# Patient Record
Sex: Female | Born: 1955 | Race: Black or African American | Hispanic: No | Marital: Married | Smoking: Former smoker
Health system: Southern US, Community
[De-identification: ages and names within clinical notes are randomized; demographics above are authoritative.]

## PROBLEM LIST (undated history)

## (undated) DIAGNOSIS — E119 Type 2 diabetes mellitus without complications: Secondary | ICD-10-CM

## (undated) DIAGNOSIS — M5430 Sciatica, unspecified side: Secondary | ICD-10-CM

## (undated) DIAGNOSIS — R0600 Dyspnea, unspecified: Secondary | ICD-10-CM

## (undated) DIAGNOSIS — C329 Malignant neoplasm of larynx, unspecified: Secondary | ICD-10-CM

## (undated) DIAGNOSIS — D72819 Decreased white blood cell count, unspecified: Secondary | ICD-10-CM

## (undated) DIAGNOSIS — F419 Anxiety disorder, unspecified: Secondary | ICD-10-CM

## (undated) DIAGNOSIS — Z930 Tracheostomy status: Secondary | ICD-10-CM

## (undated) DIAGNOSIS — K219 Gastro-esophageal reflux disease without esophagitis: Secondary | ICD-10-CM

## (undated) DIAGNOSIS — A419 Sepsis, unspecified organism: Secondary | ICD-10-CM

## (undated) DIAGNOSIS — K222 Esophageal obstruction: Secondary | ICD-10-CM

## (undated) DIAGNOSIS — T17508A Unspecified foreign body in bronchus causing other injury, initial encounter: Secondary | ICD-10-CM

## (undated) DIAGNOSIS — D649 Anemia, unspecified: Secondary | ICD-10-CM

## (undated) DIAGNOSIS — F32A Depression, unspecified: Secondary | ICD-10-CM

## (undated) DIAGNOSIS — Z923 Personal history of irradiation: Secondary | ICD-10-CM

## (undated) DIAGNOSIS — I10 Essential (primary) hypertension: Secondary | ICD-10-CM

## (undated) DIAGNOSIS — E039 Hypothyroidism, unspecified: Secondary | ICD-10-CM

## (undated) DIAGNOSIS — Z9002 Acquired absence of larynx: Secondary | ICD-10-CM

## (undated) DIAGNOSIS — R110 Nausea: Secondary | ICD-10-CM

## (undated) DIAGNOSIS — Z87891 Personal history of nicotine dependence: Secondary | ICD-10-CM

## (undated) DIAGNOSIS — R569 Unspecified convulsions: Secondary | ICD-10-CM

## (undated) DIAGNOSIS — M1990 Unspecified osteoarthritis, unspecified site: Secondary | ICD-10-CM

## (undated) DIAGNOSIS — J449 Chronic obstructive pulmonary disease, unspecified: Secondary | ICD-10-CM

## (undated) DIAGNOSIS — M542 Cervicalgia: Secondary | ICD-10-CM

## (undated) DIAGNOSIS — K648 Other hemorrhoids: Secondary | ICD-10-CM

## (undated) DIAGNOSIS — C50919 Malignant neoplasm of unspecified site of unspecified female breast: Secondary | ICD-10-CM

## (undated) DIAGNOSIS — K5790 Diverticulosis of intestine, part unspecified, without perforation or abscess without bleeding: Secondary | ICD-10-CM

## (undated) DIAGNOSIS — R011 Cardiac murmur, unspecified: Secondary | ICD-10-CM

## (undated) DIAGNOSIS — IMO0001 Reserved for inherently not codable concepts without codable children: Secondary | ICD-10-CM

## (undated) DIAGNOSIS — J40 Bronchitis, not specified as acute or chronic: Secondary | ICD-10-CM

## (undated) DIAGNOSIS — J189 Pneumonia, unspecified organism: Secondary | ICD-10-CM

## (undated) DIAGNOSIS — J329 Chronic sinusitis, unspecified: Secondary | ICD-10-CM

## (undated) HISTORY — DX: Diverticulosis of intestine, part unspecified, without perforation or abscess without bleeding: K57.90

## (undated) HISTORY — PX: LARYNGECTOMY: SUR815

## (undated) HISTORY — DX: Anemia, unspecified: D64.9

## (undated) HISTORY — DX: Gastro-esophageal reflux disease without esophagitis: K21.9

## (undated) HISTORY — DX: Personal history of irradiation: Z92.3

## (undated) HISTORY — DX: Hypothyroidism, unspecified: E03.9

## (undated) HISTORY — DX: Unspecified convulsions: R56.9

## (undated) HISTORY — DX: Chronic sinusitis, unspecified: J32.9

## (undated) HISTORY — DX: Sepsis, unspecified organism: A41.9

## (undated) HISTORY — PX: DENTAL RESTORATION/EXTRACTION WITH X-RAY: SHX5796

## (undated) HISTORY — DX: Malignant neoplasm of larynx, unspecified: C32.9

## (undated) HISTORY — DX: Malignant neoplasm of unspecified site of unspecified female breast: C50.919

## (undated) HISTORY — DX: Nausea: R11.0

## (undated) HISTORY — DX: Decreased white blood cell count, unspecified: D72.819

## (undated) HISTORY — DX: Unspecified foreign body in bronchus causing other injury, initial encounter: T17.508A

## (undated) HISTORY — DX: Cardiac murmur, unspecified: R01.1

## (undated) HISTORY — DX: Personal history of nicotine dependence: Z87.891

## (undated) HISTORY — PX: OTHER SURGICAL HISTORY: SHX169

## (undated) HISTORY — DX: Cervicalgia: M54.2

## (undated) HISTORY — DX: Other hemorrhoids: K64.8

## (undated) HISTORY — DX: Reserved for inherently not codable concepts without codable children: IMO0001

---

## 1980-06-16 HISTORY — PX: TUBAL LIGATION: SHX77

## 1998-06-01 ENCOUNTER — Emergency Department (HOSPITAL_COMMUNITY): Admission: EM | Admit: 1998-06-01 | Discharge: 1998-06-01 | Payer: Self-pay | Admitting: Emergency Medicine

## 2001-08-17 ENCOUNTER — Emergency Department (HOSPITAL_COMMUNITY): Admission: EM | Admit: 2001-08-17 | Discharge: 2001-08-18 | Payer: Self-pay

## 2002-03-22 ENCOUNTER — Emergency Department (HOSPITAL_COMMUNITY): Admission: EM | Admit: 2002-03-22 | Discharge: 2002-03-22 | Payer: Self-pay | Admitting: Emergency Medicine

## 2002-06-18 ENCOUNTER — Emergency Department (HOSPITAL_COMMUNITY): Admission: EM | Admit: 2002-06-18 | Discharge: 2002-06-18 | Payer: Self-pay | Admitting: Emergency Medicine

## 2002-07-25 ENCOUNTER — Emergency Department (HOSPITAL_COMMUNITY): Admission: EM | Admit: 2002-07-25 | Discharge: 2002-07-25 | Payer: Self-pay | Admitting: Emergency Medicine

## 2002-10-24 ENCOUNTER — Emergency Department (HOSPITAL_COMMUNITY): Admission: EM | Admit: 2002-10-24 | Discharge: 2002-10-24 | Payer: Self-pay | Admitting: Emergency Medicine

## 2002-10-24 ENCOUNTER — Encounter: Payer: Self-pay | Admitting: Emergency Medicine

## 2003-02-13 ENCOUNTER — Emergency Department (HOSPITAL_COMMUNITY): Admission: EM | Admit: 2003-02-13 | Discharge: 2003-02-13 | Payer: Self-pay | Admitting: Emergency Medicine

## 2004-01-08 ENCOUNTER — Emergency Department (HOSPITAL_COMMUNITY): Admission: EM | Admit: 2004-01-08 | Discharge: 2004-01-08 | Payer: Self-pay

## 2006-02-24 ENCOUNTER — Emergency Department (HOSPITAL_COMMUNITY): Admission: EM | Admit: 2006-02-24 | Discharge: 2006-02-24 | Payer: Self-pay | Admitting: Emergency Medicine

## 2007-02-17 ENCOUNTER — Emergency Department (HOSPITAL_COMMUNITY): Admission: EM | Admit: 2007-02-17 | Discharge: 2007-02-17 | Payer: Self-pay | Admitting: Emergency Medicine

## 2009-08-10 ENCOUNTER — Emergency Department (HOSPITAL_COMMUNITY): Admission: EM | Admit: 2009-08-10 | Discharge: 2009-08-11 | Payer: Self-pay | Admitting: Emergency Medicine

## 2009-08-28 ENCOUNTER — Emergency Department (HOSPITAL_COMMUNITY): Admission: EM | Admit: 2009-08-28 | Discharge: 2009-08-28 | Payer: Self-pay | Admitting: Emergency Medicine

## 2010-06-10 ENCOUNTER — Emergency Department (HOSPITAL_COMMUNITY)
Admission: EM | Admit: 2010-06-10 | Discharge: 2010-06-10 | Payer: Self-pay | Source: Home / Self Care | Admitting: Emergency Medicine

## 2010-06-16 DIAGNOSIS — J189 Pneumonia, unspecified organism: Secondary | ICD-10-CM

## 2010-06-16 HISTORY — DX: Pneumonia, unspecified organism: J18.9

## 2010-06-20 ENCOUNTER — Emergency Department (HOSPITAL_COMMUNITY)
Admission: EM | Admit: 2010-06-20 | Discharge: 2010-06-20 | Payer: Self-pay | Source: Home / Self Care | Admitting: Emergency Medicine

## 2010-06-20 LAB — CBC
HCT: 39.5 % (ref 36.0–46.0)
Hemoglobin: 13 g/dL (ref 12.0–15.0)
MCH: 29.7 pg (ref 26.0–34.0)
MCHC: 32.9 g/dL (ref 30.0–36.0)
MCV: 90.2 fL (ref 78.0–100.0)
Platelets: 464 10*3/uL — ABNORMAL HIGH (ref 150–400)
RBC: 4.38 MIL/uL (ref 3.87–5.11)
RDW: 12.1 % (ref 11.5–15.5)
WBC: 6.6 10*3/uL (ref 4.0–10.5)

## 2010-06-20 LAB — DIFFERENTIAL
Basophils Absolute: 0 10*3/uL (ref 0.0–0.1)
Basophils Relative: 1 % (ref 0–1)
Eosinophils Absolute: 0.2 10*3/uL (ref 0.0–0.7)
Eosinophils Relative: 3 % (ref 0–5)
Lymphocytes Relative: 42 % (ref 12–46)
Lymphs Abs: 2.8 10*3/uL (ref 0.7–4.0)
Monocytes Absolute: 0.6 10*3/uL (ref 0.1–1.0)
Monocytes Relative: 9 % (ref 3–12)
Neutro Abs: 3.1 10*3/uL (ref 1.7–7.7)
Neutrophils Relative %: 46 % (ref 43–77)

## 2010-06-20 LAB — BASIC METABOLIC PANEL
BUN: 5 mg/dL — ABNORMAL LOW (ref 6–23)
CO2: 32 mEq/L (ref 19–32)
Calcium: 9.2 mg/dL (ref 8.4–10.5)
Chloride: 98 mEq/L (ref 96–112)
Creatinine, Ser: 1 mg/dL (ref 0.4–1.2)
GFR calc Af Amer: >60 mL/min (ref >60)
GFR calc non Af Amer: 58 mL/min — ABNORMAL LOW (ref >60)
Glucose, Bld: 121 mg/dL — ABNORMAL HIGH (ref 70–99)
Potassium: 3.5 mEq/L (ref 3.5–5.1)
Sodium: 138 mEq/L (ref 135–145)

## 2010-07-07 ENCOUNTER — Emergency Department (HOSPITAL_COMMUNITY)
Admission: EM | Admit: 2010-07-07 | Discharge: 2010-07-07 | Disposition: A | Discharge status: Other Acute Inpatient Hospital | Payer: Self-pay | Source: Home / Self Care | Admitting: Emergency Medicine

## 2010-07-07 ENCOUNTER — Inpatient Hospital Stay (HOSPITAL_COMMUNITY)
Admission: EM | Admit: 2010-07-07 | Discharge: 2010-07-19 | DRG: 011 | Disposition: A | Discharge status: Home Health | Payer: Medicaid Other | Source: Other Acute Inpatient Hospital | Attending: Internal Medicine | Admitting: Internal Medicine

## 2010-07-07 DIAGNOSIS — Z6828 Body mass index (BMI) 28.0-28.9, adult: Secondary | ICD-10-CM

## 2010-07-07 DIAGNOSIS — E876 Hypokalemia: Secondary | ICD-10-CM | POA: Diagnosis not present

## 2010-07-07 DIAGNOSIS — J9600 Acute respiratory failure, unspecified whether with hypoxia or hypercapnia: Secondary | ICD-10-CM | POA: Diagnosis present

## 2010-07-07 DIAGNOSIS — R579 Shock, unspecified: Secondary | ICD-10-CM | POA: Diagnosis not present

## 2010-07-07 DIAGNOSIS — J189 Pneumonia, unspecified organism: Secondary | ICD-10-CM | POA: Diagnosis present

## 2010-07-07 DIAGNOSIS — E0781 Sick-euthyroid syndrome: Secondary | ICD-10-CM | POA: Diagnosis present

## 2010-07-07 DIAGNOSIS — C321 Malignant neoplasm of supraglottis: Principal | ICD-10-CM | POA: Diagnosis present

## 2010-07-07 DIAGNOSIS — R1319 Other dysphagia: Secondary | ICD-10-CM | POA: Diagnosis present

## 2010-07-07 DIAGNOSIS — I10 Essential (primary) hypertension: Secondary | ICD-10-CM | POA: Diagnosis present

## 2010-07-07 DIAGNOSIS — J45909 Unspecified asthma, uncomplicated: Secondary | ICD-10-CM | POA: Diagnosis present

## 2010-07-07 DIAGNOSIS — K029 Dental caries, unspecified: Secondary | ICD-10-CM | POA: Diagnosis present

## 2010-07-07 DIAGNOSIS — F17200 Nicotine dependence, unspecified, uncomplicated: Secondary | ICD-10-CM | POA: Diagnosis present

## 2010-07-07 DIAGNOSIS — Z79899 Other long term (current) drug therapy: Secondary | ICD-10-CM

## 2010-07-07 DIAGNOSIS — R6510 Systemic inflammatory response syndrome (SIRS) of non-infectious origin without acute organ dysfunction: Secondary | ICD-10-CM | POA: Diagnosis not present

## 2010-07-07 DIAGNOSIS — K0530 Chronic periodontitis, unspecified: Secondary | ICD-10-CM | POA: Diagnosis present

## 2010-07-07 DIAGNOSIS — K083 Retained dental root: Secondary | ICD-10-CM | POA: Diagnosis present

## 2010-07-07 DIAGNOSIS — E46 Unspecified protein-calorie malnutrition: Secondary | ICD-10-CM | POA: Diagnosis not present

## 2010-07-07 DIAGNOSIS — F1010 Alcohol abuse, uncomplicated: Secondary | ICD-10-CM | POA: Diagnosis present

## 2010-07-07 DIAGNOSIS — R4182 Altered mental status, unspecified: Secondary | ICD-10-CM | POA: Diagnosis not present

## 2010-07-08 ENCOUNTER — Encounter (INDEPENDENT_AMBULATORY_CARE_PROVIDER_SITE_OTHER): Payer: Self-pay | Admitting: Pulmonary Disease

## 2010-07-08 DIAGNOSIS — I10 Essential (primary) hypertension: Secondary | ICD-10-CM

## 2010-07-08 DIAGNOSIS — J9600 Acute respiratory failure, unspecified whether with hypoxia or hypercapnia: Secondary | ICD-10-CM

## 2010-07-08 DIAGNOSIS — J387 Other diseases of larynx: Secondary | ICD-10-CM

## 2010-07-08 NOTE — H&P (Signed)
NAME:  Deborah [name], Deborah [name] NO.:  [id number]  MEDICAL RECORD NO.:  [id number]          PATIENT TYPE:  EMS  LOCATION:  ED                           FACILITY:  [facility]  PHYSICIAN:  Hillery Aldo, M.D.   DATE OF BIRTH:  [birth year]  DATE OF ADMISSION:  07/07/2010 DATE OF DISCHARGE:                             HISTORY & PHYSICAL   PRIMARY CARE PHYSICIAN:  None.  CHIEF COMPLAINT:  Hemoptysis/pharyngitis.  HISTORY OF PRESENT ILLNESS:  The patient is a 55 year old female with a past medical history of hypertension and tobacco abuse who presented to the hospital, initially, on June 20, 2010 with a chief complaint of cough, wheezing, and hemoptysis.  At that time, a chest radiograph showed possible mild pneumonia.  She was treated with a Z-Pak and instructed to follow up at [facility].  She returns today with ongoing cough and hemoptysis.  She denies any associated fever or chills.  She has noted a 40-50 pound weight loss in the past year.  She has been dyspneic at rest and has occasional twinges of chest pain.  The chest pain is nonexertional.  She also reports significant pharyngitis and intermittent headaches.  She notes that she has had with liquid and solid dysphagia for the past 2 months.  She has had nausea but no frank vomiting or diarrhea.  Nothing has alleviated her symptoms, and there are no specific aggravating factors.  Upon initial evaluation in the emergency department, the patient was noted to have left lymphadenopathy on the cervical chain, and a CT scan of the neck subsequently showed a supraglottic mass.  She has been referred to the hospitalist service for further evaluation and workup.  PAST MEDICAL HISTORY: 1. Hypertension. 2. Asthma.  PAST SURGICAL HISTORY:  None.  FAMILY HISTORY:  The patient's mother died at 80 from a massive myocardial infarction.  She had a history of prior CABG and hypertension.  The patient's father died in an early age  from unknown causes.  She has multiple siblings all of whom have hypertension.  SOCIAL HISTORY:  The patient is divorced.  She currently lives with a female partner.  She quit smoking approximately 2 months ago but prior to this, had a 1-pack-per-day habit.  She quit alcohol, mainly because of dysphagia.  She denies any associated street drug use.  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  ALLERGIES:  No known drug allergies.  CURRENT MEDICATIONS: 1. Lisinopril 10 mg p.o. daily. 2. Albuterol metered-dose inhaler 2 puffs q.4h. p.r.n.  REVIEW OF SYSTEMS:  Comprehensive 14-point review of systems was completed and is noted in the elements of the HPI above.  The only other pertinent positives are intermittent restlessness and constipation  that she feels is attributable to decreased p.o. intake from dysphagia.  She specifically denies dysuria and hematuria.  PHYSICAL EXAMINATION:  VITAL SIGNS:  Temperature 97.8, pulse 85, respirations 18, and blood pressure 173/98. GENERAL:  Well-developed, well-nourished [place] American female who is in no acute distress. HEENT::  Normocephalic, atraumatic.  PERRL.  EOMI.  Sclerae nonicteric. Oropharynx is clear.  Mucous membranes are moist.  She does have prominent coarseness on  vocalization. NECK:  Supple.  No thyromegaly.  There are enlarged lymph nodes on the left anterior cervical chain which are firm to palpation and tender. CHEST:  Coarse breath sounds bilaterally but no frank wheeze. HEART:  Regular rate, rhythm.  No murmurs, rubs, or gallops. ABDOMEN:  Soft, nontender, and nondistended with normoactive bowel sounds. EXTREMITIES:  No clubbing, edema, or cyanosis. SKIN:  Warm and dry.  No rashes. NEUROLOGIC:  The patient is alert and oriented x3.  Cranial nerves II- XII grossly intact.  Nonfocal.  DATA REVIEW:  Chest x-ray shows no active cardiopulmonary disease.  CT scan of the neck shows ill-defined soft tissue density at  the supraglottic region and mild necrotic left jugulodigastric lymphadenopathy.  LABORATORY DATA:  Sodium is 138, potassium 3.8, chloride 96, bicarb 34, BUN 5, creatinine 0.96, glucose 117, and calcium 9.3.  Point-of-care cardiac markers are negative.  White blood cell count is 5.5, hemoglobin 14.2, hematocrit 42.9, and platelets 356,000.  ASSESSMENT/PLAN: 1. Supraglottic mass.  Given the patient's weight loss, this is most     certainly worrisome for malignancy.  Dr. [name] of otolaryngology     has been consulted and has requested the patient transfer to [facility] for further biopsies and operative management.  Once     pathology is known, she will likely need an oncological evaluation     and treatment plan. 2. Hypertension.  We will continue the patient on lisinopril and add     Norvasc.  We will use hydralazine p.r.n. for elevated blood     pressures. 3. Tobacco abuse.  The patient quit recently.  Have encouraged her to     remain abstinent. 4. Abnormal weight loss.  The patient most certainly has a malignancy     but we will check thyroid function studies for complete evaluation.     We will obtain a dietitian consultation.  She will likely need     nutritional supplementation. 5. Dysphagia.  We will get speech therapy evaluation. 6. History of asthma.  We will continue nebulized bronchodilator     therapy. 7. Prophylaxis.  We will initiate Lovenox for deep venous thrombosis     prophylaxis. 8. Family history of heart disease.  The patient will likely need     operative management.  Chest radiograph is benign.  Will check a 12-     lead electrocardiogram for preoperative purposes.  Time spent on admission including face-to-face time equals approximately 1 hour.     Hillery Aldo, M.D.     CR/MEDQ  D:  07/07/2010  T:  07/07/2010  Job:  [id number]  Electronically Signed by Hillery Aldo M.D. on 07/08/2010 07:03:55 AM

## 2010-07-09 DIAGNOSIS — Z9911 Dependence on respirator [ventilator] status: Secondary | ICD-10-CM

## 2010-07-09 DIAGNOSIS — J9600 Acute respiratory failure, unspecified whether with hypoxia or hypercapnia: Secondary | ICD-10-CM

## 2010-07-09 DIAGNOSIS — C329 Malignant neoplasm of larynx, unspecified: Secondary | ICD-10-CM

## 2010-07-09 LAB — DIFFERENTIAL
Basophils Absolute: 0 10*3/uL (ref 0.0–0.1)
Basophils Absolute: 0 10*3/uL (ref 0.0–0.1)
Basophils Relative: 1 % (ref 0–1)
Basophils Relative: 0 % (ref 0–1)
Eosinophils Absolute: 0.1 10*3/uL (ref 0.0–0.7)
Eosinophils Absolute: 0 10*3/uL (ref 0.0–0.7)
Eosinophils Relative: 2 % (ref 0–5)
Eosinophils Relative: 0 % (ref 0–5)
Lymphocytes Relative: 39 % (ref 12–46)
Lymphocytes Relative: 12 % (ref 12–46)
Lymphs Abs: 2.2 10*3/uL (ref 0.7–4.0)
Lymphs Abs: 0.7 10*3/uL (ref 0.7–4.0)
Monocytes Absolute: 0.5 10*3/uL (ref 0.1–1.0)
Monocytes Absolute: 0.3 10*3/uL (ref 0.1–1.0)
Monocytes Relative: 9 % (ref 3–12)
Monocytes Relative: 5 % (ref 3–12)
Neutro Abs: 2.7 10*3/uL (ref 1.7–7.7)
Neutro Abs: 4.6 10*3/uL (ref 1.7–7.7)
Neutrophils Relative %: 49 % (ref 43–77)
Neutrophils Relative %: 83 % — ABNORMAL HIGH (ref 43–77)

## 2010-07-09 LAB — POCT I-STAT 3, ART BLOOD GAS (G3+)
Acid-Base Excess: 2 mmol/L (ref 0.0–2.0)
Bicarbonate: 27.4 mEq/L — ABNORMAL HIGH (ref 20.0–24.0)
O2 Saturation: 91 %
Patient temperature: 93.1
TCO2: 29 mmol/L (ref 0–100)
pCO2 arterial: 40.1 mmHg (ref 35.0–45.0)
pH, Arterial: 7.43 — ABNORMAL HIGH (ref 7.350–7.400)
pO2, Arterial: 50 mmHg — ABNORMAL LOW (ref 80.0–100.0)

## 2010-07-09 LAB — CBC
HCT: 42.9 % (ref 36.0–46.0)
HCT: 39.3 % (ref 36.0–46.0)
Hemoglobin: 14.2 g/dL (ref 12.0–15.0)
Hemoglobin: 12.3 g/dL (ref 12.0–15.0)
MCH: 28.9 pg (ref 26.0–34.0)
MCH: 28 pg (ref 26.0–34.0)
MCHC: 33.1 g/dL (ref 30.0–36.0)
MCHC: 31.3 g/dL (ref 30.0–36.0)
MCV: 87.4 fL (ref 78.0–100.0)
MCV: 89.3 fL (ref 78.0–100.0)
Platelets: 356 10*3/uL (ref 150–400)
Platelets: 228 10*3/uL (ref 150–400)
RBC: 4.91 MIL/uL (ref 3.87–5.11)
RBC: 4.4 MIL/uL (ref 3.87–5.11)
RDW: 12.1 % (ref 11.5–15.5)
RDW: 12.3 % (ref 11.5–15.5)
WBC: 5.5 10*3/uL (ref 4.0–10.5)
WBC: 5.5 10*3/uL (ref 4.0–10.5)

## 2010-07-09 LAB — URINALYSIS, ROUTINE W REFLEX MICROSCOPIC
Bilirubin Urine: NEGATIVE
Hgb urine dipstick: NEGATIVE
Ketones, ur: 40 mg/dL — AB
Nitrite: NEGATIVE
Protein, ur: NEGATIVE mg/dL
Specific Gravity, Urine: 1.025 (ref 1.005–1.030)
Urine Glucose, Fasting: NEGATIVE mg/dL
Urobilinogen, UA: 0.2 mg/dL (ref 0.0–1.0)
pH: 6 (ref 5.0–8.0)

## 2010-07-09 LAB — BLOOD GAS, ARTERIAL
Acid-Base Excess: 5.1 mmol/L — ABNORMAL HIGH (ref 0.0–2.0)
Bicarbonate: 35.5 mEq/L — ABNORMAL HIGH (ref 20.0–24.0)
Drawn by: 31101
FIO2: 0.36 %
O2 Saturation: 94 %
Patient temperature: 98.6
TCO2: 39.6 mmol/L (ref 0–100)
pCO2 arterial: 136 mmHg (ref 35.0–45.0)
pH, Arterial: 7.046 — CL (ref 7.350–7.400)
pO2, Arterial: 98.9 mmHg (ref 80.0–100.0)

## 2010-07-09 LAB — COMPREHENSIVE METABOLIC PANEL
ALT: 14 U/L (ref 0–35)
AST: 18 U/L (ref 0–37)
Albumin: 3.6 g/dL (ref 3.5–5.2)
Alkaline Phosphatase: 77 U/L (ref 39–117)
BUN: 2 mg/dL — ABNORMAL LOW (ref 6–23)
CO2: 34 mEq/L — ABNORMAL HIGH (ref 19–32)
Calcium: 9.1 mg/dL (ref 8.4–10.5)
Chloride: 99 mEq/L (ref 96–112)
Creatinine, Ser: 0.89 mg/dL (ref 0.4–1.2)
GFR calc Af Amer: >60 mL/min (ref >60)
GFR calc non Af Amer: >60 mL/min (ref >60)
Glucose, Bld: 101 mg/dL — ABNORMAL HIGH (ref 70–99)
Potassium: 4.1 mEq/L (ref 3.5–5.1)
Sodium: 140 mEq/L (ref 135–145)
Total Bilirubin: 0.4 mg/dL (ref 0.3–1.2)
Total Protein: 7.5 g/dL (ref 6.0–8.3)

## 2010-07-09 LAB — BASIC METABOLIC PANEL
BUN: 5 mg/dL — ABNORMAL LOW (ref 6–23)
BUN: 6 mg/dL (ref 6–23)
CO2: 34 mEq/L — ABNORMAL HIGH (ref 19–32)
CO2: 32 mEq/L (ref 19–32)
Calcium: 9.3 mg/dL (ref 8.4–10.5)
Calcium: 8.4 mg/dL (ref 8.4–10.5)
Chloride: 96 mEq/L (ref 96–112)
Chloride: 102 mEq/L (ref 96–112)
Creatinine, Ser: 0.96 mg/dL (ref 0.4–1.2)
Creatinine, Ser: 0.91 mg/dL (ref 0.4–1.2)
GFR calc Af Amer: >60 mL/min (ref >60)
GFR calc Af Amer: >60 mL/min (ref >60)
GFR calc non Af Amer: >60 mL/min (ref >60)
GFR calc non Af Amer: >60 mL/min (ref >60)
Glucose, Bld: 117 mg/dL — ABNORMAL HIGH (ref 70–99)
Glucose, Bld: 161 mg/dL — ABNORMAL HIGH (ref 70–99)
Potassium: 3.8 mEq/L (ref 3.5–5.1)
Potassium: 4 mEq/L (ref 3.5–5.1)
Sodium: 138 mEq/L (ref 135–145)
Sodium: 142 mEq/L (ref 135–145)

## 2010-07-09 LAB — POCT CARDIAC MARKERS
CKMB, poc: 4.3 ng/mL (ref 1.0–8.0)
Myoglobin, poc: 91.8 ng/mL (ref 12–200)
Troponin i, poc: <0.05 ng/mL (ref 0.00–0.09)

## 2010-07-09 LAB — HEPATIC FUNCTION PANEL
ALT: 35 U/L (ref 0–35)
AST: 63 U/L — ABNORMAL HIGH (ref 0–37)
Bilirubin, Direct: 0.2 mg/dL (ref 0.0–0.3)
Indirect Bilirubin: 0.2 mg/dL — ABNORMAL LOW (ref 0.3–0.9)
Total Bilirubin: 0.4 mg/dL (ref 0.3–1.2)

## 2010-07-09 LAB — URINE DRUGS OF ABUSE SCREEN W ALC, ROUTINE (REF LAB)
Amphetamine Screen, Ur: NEGATIVE
Barbiturate Quant, Ur: NEGATIVE
Creatinine,U: 309.4 mg/dL
Marijuana Metabolite: NEGATIVE
Methadone: NEGATIVE
Propoxyphene: NEGATIVE

## 2010-07-09 LAB — GLUCOSE, CAPILLARY
Glucose-Capillary: 149 mg/dL — ABNORMAL HIGH (ref 70–99)
Glucose-Capillary: 85 mg/dL (ref 70–99)
Glucose-Capillary: 75 mg/dL (ref 70–99)

## 2010-07-09 LAB — APTT: aPTT: 27 seconds (ref 24–37)

## 2010-07-09 LAB — PROTIME-INR
INR: 1.03 (ref 0.00–1.49)
Prothrombin Time: 13.7 seconds (ref 11.6–15.2)

## 2010-07-09 LAB — CORTISOL: Cortisol, Plasma: 39.9 ug/dL

## 2010-07-09 LAB — TSH
TSH: 0.273 u[IU]/mL — ABNORMAL LOW (ref 0.350–4.500)
TSH: 0.222 u[IU]/mL — ABNORMAL LOW (ref 0.350–4.500)

## 2010-07-10 LAB — GLUCOSE, CAPILLARY
Glucose-Capillary: 72 mg/dL (ref 70–99)
Glucose-Capillary: 74 mg/dL (ref 70–99)
Glucose-Capillary: 67 mg/dL — ABNORMAL LOW (ref 70–99)
Glucose-Capillary: 79 mg/dL (ref 70–99)
Glucose-Capillary: 90 mg/dL (ref 70–99)
Glucose-Capillary: 108 mg/dL — ABNORMAL HIGH (ref 70–99)

## 2010-07-10 LAB — CBC
HCT: 38.6 % (ref 36.0–46.0)
Hemoglobin: 9.9 g/dL — ABNORMAL LOW (ref 12.0–15.0)
Hemoglobin: 12.4 g/dL (ref 12.0–15.0)
MCH: 28.3 pg (ref 26.0–34.0)
MCHC: 32.2 g/dL (ref 30.0–36.0)
MCHC: 32.1 g/dL (ref 30.0–36.0)
RBC: 3.51 MIL/uL — ABNORMAL LOW (ref 3.87–5.11)
WBC: 5.5 10*3/uL (ref 4.0–10.5)

## 2010-07-10 LAB — BASIC METABOLIC PANEL
CO2: 31 mEq/L (ref 19–32)
Calcium: 9.1 mg/dL (ref 8.4–10.5)
Chloride: 111 mEq/L (ref 96–112)
Creatinine, Ser: 0.9 mg/dL (ref 0.4–1.2)
Creatinine, Ser: 0.73 mg/dL (ref 0.4–1.2)
GFR calc Af Amer: >60 mL/min (ref >60)
Glucose, Bld: 91 mg/dL (ref 70–99)
Potassium: 2.9 mEq/L — ABNORMAL LOW (ref 3.5–5.1)
Sodium: 143 mEq/L (ref 135–145)

## 2010-07-10 LAB — MAGNESIUM: Magnesium: 1.9 mg/dL (ref 1.5–2.5)

## 2010-07-10 LAB — URINE CULTURE: Culture: NO GROWTH

## 2010-07-10 LAB — PHOSPHORUS: Phosphorus: 1 mg/dL — CL (ref 2.3–4.6)

## 2010-07-11 LAB — CBC
Hemoglobin: 11.8 g/dL — ABNORMAL LOW (ref 12.0–15.0)
MCH: 28.4 pg (ref 26.0–34.0)
MCV: 86.5 fL (ref 78.0–100.0)
RBC: 4.16 MIL/uL (ref 3.87–5.11)

## 2010-07-11 LAB — BASIC METABOLIC PANEL
CO2: 30 mEq/L (ref 19–32)
Chloride: 103 mEq/L (ref 96–112)
Creatinine, Ser: 0.85 mg/dL (ref 0.4–1.2)
GFR calc Af Amer: >60 mL/min (ref >60)
Potassium: 3.4 mEq/L — ABNORMAL LOW (ref 3.5–5.1)

## 2010-07-11 LAB — PHOSPHORUS: Phosphorus: 3.9 mg/dL (ref 2.3–4.6)

## 2010-07-11 NOTE — Consult Note (Addendum)
NAME:  Deborah [name], HUHTA            ACCOUNT NO.:  [id number]  MEDICAL RECORD NO.:  [id number]          PATIENT TYPE:  EMS  LOCATION:  ED                           FACILITY:  [facility]  PHYSICIAN:  Jefry H. [name], MD     DATE OF BIRTH:  03-24-1956  DATE OF CONSULTATION:  07/07/2010 DATE OF DISCHARGE:                                CONSULTATION   SITE:  [facility].  REASON FOR CONSULTATION:  Throat problems.  TIME OF CONSULTATION:  7:00 a.m.  HISTORY:  This is a 55 year old lady who last March started having some intermittent sore throat that has never completely resolved.  She has been treated with antibiotics on a couple of occasions.  Over the past month, she has developed left-sided ear and neck pain and difficulty swallowing.  She was seen last week, started on antihypertensive medicine and treated for throat infection with antibiotics but has not resolved.  Her voice is also abnormal for the past month or so.  She has a history of smoking 1 pack per day and alcohol 40 ounces of beer daily but she quit last week.  She was previously on no medicines until the antihypertensive was started last week.  She does not have a doctor, has not been to a doctor other than the emergency department for many years.  PHYSICAL EXAMINATION:  GENERAL:  Healthy-appearing lady, very anxious but in no distress.  Her voice is strained and when she breathes hard, she does have some inspiratory stridor.  She is moving air well.  She is able to lie down, and she is handling her secretions okay. NECK:  Palpation of the neck reveals a level II node approximately 3 cm. No other masses palpable.  She is tender around the entire anterior neck. HEENT:  Oral cavity and pharynx reveals multiple missing teeth and gingival disease, but no signs of any mass or ulceration.  Intranasal exam unremarkable.  Ears are normal externally.  DIAGNOSTIC STUDIES:  Flexible fiberoptic examination was  performed and this reveals a large tumor that seems to rise in the anterior supraglottic larynx.  There is a large ulceration of the laryngeal surface of the epiglottis and then a large mass inferior to that that obscures the airway.  The vocal cords are not visible.  There is no pooling of secretions.  There is no bleeding, and she tolerated this well.  The piriform sinuses are clear.  The lingual surface of the epiglottis appears uninvolved except for some submucosal fullness and the base of the tongue is clear.  CT scan reviewed.  There is a large supraglottic mass and a large necrotic node on the left side, level II.  This is all consistent with the examination findings.  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  PLAN:  She is going to be transferred to [facility] to the hospitalist service for continued management of medical issues and to continue workup of this tumor.  She is going to require tissue biopsy which we discussed 2 different way to do that.  One way is to do an FNA of the neck mass.  The second way is to do endoscopy which unfortunately because of the airway obstruction is going to require tracheostomy.  We will hold off on that for now and we will get an FNA when are able to at [facility] and then make a disposition from there.  She is going to need to see the oncology folks as well.     Jefry H. [name], MD     JHR/MEDQ  D:  07/07/2010  T:  07/07/2010  Job:  [id number]  Electronically Signed by Serena Colonel MD on 07/11/2010 10:46:51 AM

## 2010-07-11 NOTE — Op Note (Addendum)
NAME:  Deborah [name], Deborah [name]            ACCOUNT NO.:  [id number]  MEDICAL RECORD NO.:  [id number]          PATIENT TYPE:  INP  LOCATION:  2104                         FACILITY:  [facility]  PHYSICIAN:  Jefry H. [name], MD     DATE OF BIRTH:  1955-10-17  DATE OF PROCEDURE:  07/08/2010 DATE OF DISCHARGE:                              OPERATIVE REPORT   PREOPERATIVE DIAGNOSES:  Supraglottic mass and acute airway obstruction.  POSTOPERATIVE DIAGNOSES:  Supraglottic mass and acute airway obstruction.  PROCEDURES: 1. Emergency tracheostomy. 2. Direct laryngoscopy with biopsy of supraglottic mass. 3. Esophagoscopy.  SURGEON:  Jefry H. [name], MD  ANESTHESIA:  Local anesthesia was used for the tracheostomy and general for the endoscopy.  ESTIMATED BLOOD LOSS:  Less than 50 mL.  There were no complications.  FINDINGS:  Initially, the patient was stridulous and sats were good, but the CO2 had risen up to about 90 by the time the patient was in the operating room.  On endoscopy, the findings were a supraglottic mass with complete obliteration of the laryngeal introitus, and vocal cords were not visualized.  HISTORY:  This is a 54-year lady who was admitted to the hospital earlier in the morning, transferred over from [facility] with a 9- to 88-month history of sore throat.  She says she has been having trouble swallowing recently.  On exam earlier in the day, she was lying supine in the emergency department and not having any airway obstruction, although she did have mild stridor when she took a deep or fast breath. She was transferred to the [facility], admitted to the Hospitalist Service and some time during the late hours of the evening or early morning hours developed acute airway obstruction and stridor and Anesthesia was called to intubate.  I was contacted to perform the emergency tracheostomy.  1. The patient was brought emergently to the operating room.  The sats     were  maintained with face mask ventilation.  Xylocaine 1% with     epinephrine was infiltrated into the neck just above the sternal     notch and up to the cricoid area.  Electrocautery was used to     incise a vertical incision in the skin, trying to keep the     tracheostomy high because of the probable need for future     laryngectomy.  The superficial fascia was divided with cautery.     The strap muscle and diastasis was divided as well.  There was some     bleeding encountered with the upper part of the thyroid gland.     This was treated with cautery.  The upper trachea was identified     and tracheotomy was created just between the cricoid and what     appeared to be the first tracheal ring.  This was spread open with     a trach spreader and a #6 cuffed Shiley trach tube was inserted     without difficulty.  She was then placed on the ventilator and     given general anesthetic.  The trach was secured in place with  nylon sutures and Velcro straps.  A dressing was applied as well.     There was no significant bleeding.  1. Direct laryngoscopy with biopsy.  The Gerilyn Pilgrim laryngoscope was then     used to perform laryngoscopy and biopsies were taken of the     supraglottic mass.  The mass appeared to arise in the laryngeal     surface of the epiglottis.  The tip of the epiglottis appeared to     be uninvolved.  The vallecula was clean mucosally, but there was     fullness submucosally.  The base of tongue appeared to be     uninvolved.  The lesion extended inferiorly towards the glottis,     although the glottic structures were not visualized because of the     tumor.  The posterior pharyngeal wall and the esophageal inlet were     all clear.  The piriform sinuses appeared clear as well.  Multiple     biopsies were taken, sent for pathologic evaluation.  1. Esophagoscopy.  The cervical esophagoscope was used to inspect the     esophagus.  Esophageal mucosa was cleaned throughout.   There no     lesions identified.  The patient was then transferred back to     intensive care unit in stable condition.     Jefry H. [name], MD     JHR/MEDQ  D:  07/08/2010  T:  07/08/2010  Job:  [id number]  Electronically Signed by Serena Colonel MD on 07/11/2010 10:46:53 AM

## 2010-07-12 LAB — CBC
HCT: 32.7 % — ABNORMAL LOW (ref 36.0–46.0)
Hemoglobin: 10.6 g/dL — ABNORMAL LOW (ref 12.0–15.0)
MCHC: 32.4 g/dL (ref 30.0–36.0)
RBC: 3.77 MIL/uL — ABNORMAL LOW (ref 3.87–5.11)

## 2010-07-12 LAB — BASIC METABOLIC PANEL
CO2: 29 mEq/L (ref 19–32)
Calcium: 8.7 mg/dL (ref 8.4–10.5)
Chloride: 102 mEq/L (ref 96–112)
Glucose, Bld: 99 mg/dL (ref 70–99)
Potassium: 3.5 mEq/L (ref 3.5–5.1)
Sodium: 141 mEq/L (ref 135–145)

## 2010-07-12 LAB — OPIATE, QUANTITATIVE, URINE
Codeine Urine: NEGATIVE NG/ML
Hydrocodone: NEGATIVE NG/ML
Hydromorphone GC/MS Conf: NEGATIVE NG/ML
Oxycodone, ur: NEGATIVE NG/ML
Oxymorphone: NEGATIVE NG/ML

## 2010-07-12 LAB — COCAINE, URINE, CONFIRMATION: Benzoylecgonine GC/MS Conf: 9266 NG/ML — ABNORMAL HIGH

## 2010-07-14 LAB — BASIC METABOLIC PANEL
BUN: 3 mg/dL — ABNORMAL LOW (ref 6–23)
Creatinine, Ser: 0.84 mg/dL (ref 0.4–1.2)
GFR calc non Af Amer: >60 mL/min (ref >60)
Glucose, Bld: 70 mg/dL (ref 70–99)

## 2010-07-14 LAB — CULTURE, BLOOD (ROUTINE X 2)
Culture  Setup Time: 201201231106
Culture  Setup Time: 201201231031
Culture: NO GROWTH

## 2010-07-14 NOTE — Op Note (Signed)
NAME:  Deborah [name], Deborah [name] NO.:  [id number]  MEDICAL RECORD NO.:  [id number]           PATIENT TYPE:  LOCATION:                                 FACILITY:  PHYSICIAN:  Clovis Pu. 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  PROCEDURE:  Placement of open Stamm gastrostomy tube using 24-French.  SURGEON:  Maisie Fus A. Geana Walts, MD  ANESTHESIA:  General endotracheal anesthesia.  ESTIMATED BLOOD LOSS:  Minimal.  SPECIMEN:  None.  INDICATIONS FOR PROCEDURE:  The patient is an unfortunate 55 year old female with head and neck cancer.  She requires gastrostomy for upcoming laryngectomy for nutritional supplementation and emptying of her stomach.  I discussed the procedure with the patient and her daughter at the bedside.  Risk of bleeding, infection, tube dislodgement, abdominal sepsis requiring reoperation, organ injury, displaced of the tumor, migration of the tube are all potential complications of the procedure. They voiced understanding of the need for the tube and have agreed to proceed after discussion.  DESCRIPTION OF PROCEDURE:  The patient was brought to the operating room.  After induction of general anesthesia, the abdomen was prepped and draped in sterile fashion.  She received perioperative antibiotics. Midline incision was used to expose the xiphoid.  Dissection was carried down to the midline fascia.  This was opened and the abdominal cavity was entered without difficulty.  Stomach was massively distended.  I identified the transverse colon to make sure that this was well out of the way.  This was visually identified and replaced back in the abdominal cavity.  Stomach was superior to that and the mid stomach was identified.  We  tried to put an orogastric tube but this was not possible due to her head and neck cancer.  A pursestring suture of 2-0 Vicryl was placed in the midbody of the stomach.  Through a separate stab incision, a 24-French Malecot Stamm gastrostomy tube was placed in the left upper quadrant into the abdominal cavity under direct vision. We then made a gastrotomy in the middle of the pursestring suture.  I placed a tube in the stomach and cinched the tube down.  I put a second pursestring suture around the tube.  I then flushed the tube under direct visualization to make sure there was no leakage from the tube and there was not.  I then secured the stomach to the undersurface of the abdominal wall with three interrupted 2-0 Vicryl sutures.  We then secured the tube to the skin with 2-0 nylon.  It was then clamped.  I reinspected the stomach and saw no twisting of the stomach and the tube was in the mid-to-distal body of the stomach well within the gastric lumen it felt like.  There was no evidence of leakage or extravasation or injury to the transverse colon which was well out of the way which I visually inspected.  At this point in time, we then closed the fascia with #1 running PDS  after ensuring good hemostasis.  Skin was irrigated and closed with staples.  All final counts of sponge, needle, and instruments were found to be correct at this portion of the case. The patient was extubated and taken back to recovery in satisfactory condition.     Cori Justus A. Shiana Rappleye, M.D.     TAC/MEDQ  D:  07/12/2010  T:  07/13/2010  Job:  [id number]  Electronically Signed by Harriette Bouillon M.D. on 07/14/2010 12:34:34 AM

## 2010-07-15 LAB — GLUCOSE, CAPILLARY: Glucose-Capillary: 97 mg/dL (ref 70–99)

## 2010-07-16 NOTE — Consult Note (Signed)
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  The patient with anticipated laryngectomy followed by probable chemoradiation therapy. The patient now seen as part of pre-chemoradiation therapy dental protocol evaluation.  MEDICAL HISTORY: 1. Squamous cell carcinoma of the supraglottic larynx.     a.     Status post direct  laryngoscopy, biopsy, esophagoscopy and      emergent tracheostomy on July 08, 2010, with Dr. [name].      Pathology was positive for squamous cell carcinoma of the      supraglottic larynx.     b.     Anticipated laryngectomy with Dr. [name] on July 31, 2010.     c.     Anticipated postoperative chemoradiation therapy as      indicated with Dr. Mitzi Hansen and medical oncologist as indicated. 2. Hypertension. 3. Asthma.  ALLERGIES:  None known.  MEDICATIONS: 1. Chlorhexidine rinses twice daily. 2. Lovenox 40 mg subcutaneously every 24 hours. 3. Folic acid 1 mg IV daily. 4. Combivent 2 puffs twice daily. 5. Protonix 40 mg daily. 6. Thiamin 100 mg daily.  SOCIAL HISTORY:  The patient is divorced.  The patient with a history of smoking 1 pack per day since a teenager.  The patient with a history of drinking approximately one 40-ounce beer per day but recently quit.  FAMILY HISTORY:  Mother died at the age of 45 from a massive myocardial infarction.  The patient with a history of previous coronary artery bypass graft and hypertension.  Father died of unknown causes.  FUNCTIONAL ASSESSMENT:  The patient was independent for ADLs prior  to this admission.  REVIEW OF SYSTEMS:  This is reviewed from the chart for this admission.  DENTAL HISTORY:  CHIEF COMPLAINT:  The patient needs a pre-chemoradiation therapy dental evaluation.  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  The patient with anticipated laryngectomy, followed by probable chemoradiation therapy. The patient was seen as a part of pre-chemoradiation therapy dental protocol evaluation.  The patient with a history of upper left quadrant toothache symptoms but none now.  The patient indicates that she has not seen a dentist for over 10 years.  The patient denies having any partial dentures.  DENTAL EXAM:  GENERAL:  The patient is a well-developed, well-nourished female in no acute distress. VITAL SIGNS:  Blood pressure 158/93, pulse rate is 93, temperature is 98.0, respirations of 17. HEAD AND NECK:  The patient with tracheostomy in place.  The patient denies acute TMJ symptoms at this time.  INTRAORAL EXAM:  The patient with normal saliva.  The patient has bilateral mandibular lingual tori.  The patient has an upper  left quadrant exostosis in the area #15 and #16.  The patient with a mid palatal torus near the vibrating line.  DENTITION:  The patient with multiple missing teeth.  I would need dental x-rays to identify the exact tooth numbers present/missing.  PERIODONTAL:  The patient with chronic advanced periodontal disease with plaque and calculus accumulations, gingival recession, and incipient tooth mobility.  DENTAL CARIES:  There are multiple dental caries noted.  I would need other additional dental x-rays to delineate the extent of the dental caries.  ENDODONTIC:  The patient with a history of acute pulpitis symptoms.  The patient most likely has periapical pathology but will need to review dental x-ray once able to be obtained through the department  of radiology.  This has been ordered today.  CROWN OR BRIDGE:  There are no crown or bridge restorations noted.  PROSTHODONTIC:  The patient with no history of partial dentures.  OCCLUSION:  The patient with a poor occlusal scheme secondary to multiple missing teeth, supereruption and drifting of the unopposed teeth into the edentulous areas and lack of replacement of missing teeth with dental prostheses.  RADIOGRAPHIC INTERPRETATION:  A panoramic x-ray was ordered but not taken at this time.  The panoramic x-ray was reordered and will be obtained in the morning once the clinical expertise has been arranged down in the department of radiology to obtain the dental x-ray required.  ASSESSMENT: 1. Chronic periodontitis with bone loss. 2. Gingival recession. 3. Tooth mobility. 4. Multiple dental caries. 5. Multiple missing teeth. 6. Multiple retained root segments. 7. Bilateral mandibular lingual tori. 8. 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 9. Buccal exostoses in the upper left quadrant. 10.Current Lovenox therapy with risk for bleeding with invasive dental     procedures. 11.Poor occlusal scheme. 12.No history of partial dentures.  PLAN/RECOMMENDATIONS: 1. I discussed the risks, benefits, and complications of various     treatment options to the patient in relationship to her medical and     dental conditions, anticipated chemoradiation therapy and     chemoradiation therapy side effects to include xerostomia,     radiation caries, trismus, mucositis, taste changes, gum and jaw     bone changes, risk for infection, bleeding, osteoradionecrosis.  We     discussed various treatment options to include no treatment,     multiple extractions with alveoloplasty, periodontal therapy,     dental restorations, root canal therapy, crown or bridge therapy,     implant therapy, and replacing missing teeth as indicated     approximately 3 months after last radiation therapy is  complete.     Currently, further treatment plan discussion will be put on hold     pending the ability to obtain either an orthopantogram x-ray     through the department of radiology or obtaining the dental x-rays     through the hospital dental clinic at [facility] as an     outpatient.  Ultimate decision is pending, further ability to     obtain the x-rays as an inpatient setting. 2. Discussion of findings with Dr. [name], Dr. Mitzi Hansen and medical     oncologist as indicated to coordinate future chemoradiation therapy     as indicated.     Charlynne Pander, D.D.S.     RFK/MEDQ  D:  07/15/2010  T:  07/16/2010  Job:  [id number]  cc:   Radene Gunning, MD, PhD [name]. [name], MD  Electronically Signed by Cindra Eves D.D.S.  on 07/16/2010 03:33:23 PM

## 2010-07-17 LAB — BASIC METABOLIC PANEL
Calcium: 8.7 mg/dL (ref 8.4–10.5)
Chloride: 103 mEq/L (ref 96–112)
Creatinine, Ser: 0.73 mg/dL (ref 0.4–1.2)
GFR calc Af Amer: >60 mL/min (ref >60)

## 2010-07-18 LAB — BASIC METABOLIC PANEL
Calcium: 9.2 mg/dL (ref 8.4–10.5)
GFR calc Af Amer: >60 mL/min (ref >60)
GFR calc non Af Amer: >60 mL/min (ref >60)
Sodium: 142 mEq/L (ref 135–145)

## 2010-07-23 ENCOUNTER — Other Ambulatory Visit (HOSPITAL_COMMUNITY): Payer: Self-pay | Admitting: Dentistry

## 2010-07-23 DIAGNOSIS — K0530 Chronic periodontitis, unspecified: Secondary | ICD-10-CM

## 2010-07-23 DIAGNOSIS — K045 Chronic apical periodontitis: Secondary | ICD-10-CM

## 2010-07-23 DIAGNOSIS — K036 Deposits [accretions] on teeth: Secondary | ICD-10-CM

## 2010-07-23 DIAGNOSIS — K083 Retained dental root: Secondary | ICD-10-CM

## 2010-07-23 NOTE — Discharge Summary (Addendum)
NAME:  Deborah [name], Deborah [name] NO.:  [id number]  MEDICAL RECORD NO.:  [id number]          PATIENT TYPE:  INP  LOCATION:  5506                         FACILITY:  [facility]  PHYSICIAN:  Lonia Blood, M.D.       DATE OF BIRTH:  1955-09-24  DATE OF ADMISSION:  07/07/2010 DATE OF DISCHARGE:  07/19/2010                              DISCHARGE SUMMARY   PRIMARY CARE PROVIDER:  Gentry Fitz.  DISCHARGE DIAGNOSES: 1. Squamous cell carcinoma of the supraglottic larynx. 2. Hypertension. 3. Dysphagia, status post open gastrostomy tube. 4. History of asthma. 5. History of tobacco abuse.  Patient quit tobacco recently. 6. Hypercarbic respiratory failure, resolved. 7. Status post emergent tracheostomy on July 08, 2010 per Dr.     [name].  DISCHARGE MEDICATIONS: 1. Albuterol 90 mcg per inhaler 2 puffs inhaled every 3 hours as     needed for shortness of breath. 2. Antiseptic rinse solution 1 application q.i.d. 3. Hydrocodone/chlorpheniramine 5 mL per tube every 12 hours as needed     for cough. 4. Combivent inhaler 2 puffs b.i.d. 5. Jevity per tube 2 cans daily at 6 a.m., 1 can daily at 8 a.m., 2     cans daily at 12 noon, 1 can daily at 2 p.m., 1-1/2 cans daily at 6     p.m. 6. Zofran 4 mg per tube every 6 hours as needed for nausea. 7. Oxycodone 5 mg IR per tube 10-15 mg q.4 h. as needed for pain. 8. Protonix oral suspension per tube 40 mg daily. 9. Albuterol inhaler 2 puffs every 6 hours as needed for shortness of     breath. 10.Lisinopril 10 mg p.o. daily.  LABS ON ADMISSION:  Pertinent labs, sodium 138, potassium 3.8, chloride 96, CO2 24, glucose 117, BUN 5, creatinine 0.96, TSH 0.273.  Arterial blood gas on January 23rd pH of 7.046, pO2 98.9, pCO2 136, bicarb 35.5, total CO2 39.6, PT 13.7, INR 1.93, PTT 27, AST 63, magnesium 2.2, lactic acid 1.6, phosphorus 6.0, procalcitonin less than 0.10.  MRSA screening negative on date of discharge.  Sodium was 142, potassium 4.8,  chloride 101, CO2 33, BUN 30, creatinine 0.87, glucose 95.  PROCEDURES: 1. Chest x-ray on January 22nd showed no active cardiopulmonary     disease. 2. Follow up chest x-ray on January 23rd tracheostomy tube in place.     No pneumothorax. 3. CT scan of neck with contrast on January 22nd nodular soft tissue     density in the supraglottic region involving the vallecula, left     side greater than right.  Supraglottic carcinoma cannot be     excluded.  Direct visualization is recommended.  Mild necrotic left     jugulodigastric lymphadenopathy.  At their origin of the right     subclavian artery incidentally noted. 4. Panorex on July 06, 2010.  Periapical lucency is associated with     retained roots of left maxillary molar.  Periapical abscess is not     excluded.  Dental cavity of the left mandibular second molar.     Suspected maxillary sinusitis. 5. Emergency tracheostomy on July 08, 2010 by Dr. [name].  6. Direct laryngoscopy with biopsy of supraglottic mass on July 08, 2010 by Dr. [name]. 7. Esophagoscopy on July 08, 2010 by Dr. [name]. 8. Laryngeal biopsy of supraglottic mass, invasive squamous cell     carcinoma.  CONSULTATIONS: 1. ENT - Serena Colonel, MD 2.  Critical Care Medicine. 3. Dentist - Charlynne Pander, DDS. 4. General Surgery - Thomas A. Cornett, MD 5. Oncology - [name], MD. 6. Radiation Oncology.  BRIEF HISTORY:  The patient is a 55 year old Deborah [name] female with multiple mild problems who presents to the ED with complaints of hemoptysis/pharyngitis.  It was noted that on June 20, 2010 she initially presented with complaints of cough, wheezing, and hemoptysis. At the time, she was seen in the ED.  A chest x-ray was done which showed possible mild pneumonia.  She was treated with a Z-Pak and instructed to follow up at [facility].  She came back to the ED January 22nd because of ongoing cough and hemoptysis.  She denied fevers and no chills.   She reported a 40-50 pound weight loss over the past year.  She also reported that she had been dyspneic at rest and had occasional twinges of chest pain.  She also reported that she has sore throat and intermittent headache.  The patient also reported that she has had dysphagia to liquids and solids for 2 months.  She admitted to nausea, but no vomiting or diarrhea.  In the ED upon initial evaluation, she was found to have adenopathy in her left cervical area.  A CT scan of the neck was done and it showed a supraglottic mass.  The hospitalist service was asked to admit for further evaluation and management.  HOSPITAL COURSE: 1. Squamous cell carcinoma of the supraglottic larynx.  The patient     had a CT of the neck which showed a supraglottic mass and Dr. [name]     with ENT was consulted to see the patient.  He requested that the     patient be transferred from [facility] to [facility] for further     biopsies and operative management.  Following the transfer to [facility] the [facility] was called on July 08, 2010     at 3 a.m. indicating that the patient was in respiratory distress     and had dropped her O2 sats and required to be placed on 6 liters     of nasal cannula oxygen.  An ABG was obtained and rapid response     was called.  The ABG revealed a pH of 7.04 with a pCO2 of 136, O2     of 98, and a bicarb 35.5.  The patient was placed on BiPAP and     transferred to the ICU.  The critical care team was consulted and     on arrival to the room, Anesthesia was also present with a     fiberoptic scope and a GlideScope.  Oral anesthesia was attempted     with topical lidocaine to facilitate a fiberoptic look for     intubation.  The patient was unable to corporate due to altered     mental status and so the ENT was notified of the situation.  The     decision was made to take the patient to the OR for emergent awake     tracheostomy and this was done per Dr.  [name].  The patient was  followed and managed by ENT and Critical Care Medicine and on     July 09, 2010, she was taken off the vent and placed on the     trach collar, which she tolerated well for greater than 24 hours.     Following that, Critical Care recommended to continue the trach     collar and transfer the patient to a floor and back to the     hospitalist service and this was done.  General Surgery was     consulted by the ENT for a gastrostomy tube and they saw the     patient on July 11, 2010.  She was taken for surgery on July 12, 2010 and she tolerated the procedure well.  General Surgery     followed and started her on tube feedings on July 13, 2010 and     she has been tolerating her tube feedings well.  Dr. [name]     discussed laryngectomy with the patient and family and following     this, he consulted Radiation Oncology.  They saw the patient and     indicated that trimodality treatment will be appropriate for the     patient and stated that they will make final decision on postop     chemo/radiation therapy pending the pathology and that the patient     needed to be seen by Medical Oncology.  Medical Oncology was     consulted as the pathology came back positive for squamous cell     cancer and Dr. Gaylyn Rong saw the patient on July 15, 2010.  Following     his evaluation and discussion with family, he indicated that he     will see the patient again in the clinic to discuss the risks and     benefits of adjuvant chemotherapy if she needed that at all.  He     stated that given the patient's CT for staging that upfront     chemoradiation therapy may not be very effective.  He stated that     she most likely had an incompetent larynx given the massive tumor     and may not have a very competent voice box after definitive     chemoradiation therapy.  Therefore, he agreed with the patient's     decision along with Dr. [name] to proceed with laryngectomy and      bilateral cervical lymph node dissection.  He stated that the     chance of recurrence was very high with resection alone.  Greater     than 50%, so he recommended adjuvant chemoradiation therapy.     Please see Dr. Lodema Pilot consult note for the rest of the details of his     consult.  He recommended that the patient improve, her performance     status by ambulating around with physical therapy in addition to     her tube feedings.  Dr. [name] is indicated that surgery is     scheduled for February 14th and has also indicated that the patient     may be discharged prior to surgery.  The patient did experience     increased pain on January 31st and therefore her oral narcotics     were increased to be administered through her feeding tube. 2. Dysphasia.  As discussed above, the patient is status post open     gastrostomy tube per [facility] on July 12, 2010.  Her  tube feedings were started on January 28 and she has been     tolerating them well.  Dr. Gerrit Friends followed up with the patient on     January 31 and has recommended that if the patient is still in the     hospital by the end of the week, the staples can be taken out.  If     not, she is to follow up at the office in 7-10 days. 3. Hypertension.  The patient was restarted on her lisinopril and this     was the increased to b.i.d. dosing.  Her blood pressure has been     controlled on this regimen. 4. History of asthma.  She was maintained on her bronchodilators     during the hospital stay. 5. Sick euthyroid syndrome.  While in the ICU, the patient had thyroid     function test and this was consistent with sick euthyroid syndrome.     She is to follow up on outpatient for repeat thyroid function tests     in 4-6 weeks for further management.  PHYSICAL EXAM:  GENERAL:  The patient is sitting on side of bed, awake, alert, cooperative. CV:  Regular rate and rhythm.  No murmur, gallop or rub.  No lower extremity  edema. RESPIRATORY:  Trach intact.  Normal respiratory effort, mild rhonchi throughout.  No wheeze, no rales. ABDOMEN:  Soft, positive bowel sounds.  PEG in place, nontender. VITAL SIGNS:  Temperature 98.2, blood pressure 141/93, heart rate 98, respiration 20, sats 98% on 28% trach collar.  FOLLOWUP:  The patient to see Dr. [name] preoperatively his.  Office will call her.  Next follow up with [facility] in 7 days.  CONDITION ON DISCHARGE:  The patient is stable.  Time spent on discharge 40 minutes.    Gwenyth Bender, NP   ______________________________ Lonia Blood, M.D.   KMB/MEDQ  D:  07/19/2010  T:  07/19/2010  Job:  [id number]  Electronically Signed by Lonia Blood M.D. on 07/23/2010 06:44:10 PM Electronically Signed by Toya Smothers  on 08/11/2010 08:27:57 AM

## 2010-07-23 NOTE — Consult Note (Signed)
NAME:  Deborah [name], Deborah [name] NO.:  [id number]  MEDICAL RECORD NO.:  [id number]           PATIENT TYPE:  LOCATION:                                 FACILITY:  PHYSICIAN:  Clovis Pu. Slate Debroux, M.D.DATE OF BIRTH:  1955/07/08  DATE OF CONSULTATION:  07/11/2010 DATE OF DISCHARGE:                                CONSULTATION   REFERRING PHYSICIAN:  Dr. [name].  PRIMARY CARE:  Dr. Darnelle Catalan, the hospitalist.  CHIEF COMPLAINT AND REASON FOR CONSULTATION:  Open gastrostomy.  BRIEF HISTORY:  The patient is a 55 year old African American female with a 9-10 month history of sore throat and trouble swallowing.  She was seen on June 20, 2010, with coughing, wheezing, hemoptysis, and was treated for pneumonia.  Her symptoms did not resolve.  She returned to [facility] and was seen again in the ER at [facility]. At that time, she also reported 40-50 pound weight loss over the last year.  Workup on July 07, 2010, led to the finding of a supraglottic mass.  The patient was seen in consultation by Dr. Margo Aye, ENT and on July 08, 2010, underwent emergent trach laryngoscopy and esophagoscopy for stridor acute respiratory obstruction.  The patient's biopsies subsequently showed squamous cell carcinoma and the patient is being worked up and prepared for laryngectomy.  We were asked to see the patient for open gastrostomy placement.  PAST MEDICAL HISTORY: 1. Hypertension. 2. Asthma. 3. Tobacco use.  SOCIAL HISTORY:  None.  PAST SURGICAL HISTORY:  None.  FAMILY HISTORY:  Mother died at 8 with an MI, history of CABG, and hypertension.  Father is unknown.  She has siblings with hypertension.  SOCIAL HISTORY: Tobacco, one pack per day, since she was a teenager.  Alcohol, she quit secondary to dysphasia.  Drugs, none.  She is divorced.  She has a partner and is currently unemployed.  REVIEW OF SYSTEMS:  Not obtained secondary to her tracheostomy, but review of  systems by the hospitalist primary admission was essentially negative except for the above findings.  MEDICATIONS ON ADMISSION:  Lisinopril and albuterol.  CURRENT MEDICATIONS: 1. Lovenox 40 mg daily. 2. DuoNeb x2 q.6 h. 3. Protonix 40 mg daily. 4. Valium 100 mg daily.  ALLERGIES:  None known.  LABORATORY DATA:  White count is 6.1, hemoglobin is 11.8, hematocrit is 36, platelets are 268,000.  Sodium is 142, potassium is 3.4, chloride is 103, CO2 is 30, BUN is 4, creatinine is 0.85, glucose 91, and mag is 2.0.  TSH is 0.22.  T4 is 0.82.  Drug screen was positive for cocaine and. opiates, I do not know whether that was done before after her fiberoptic exam.  Protime is 27, PTT is 13.7, and INR is 1.03.  Chest x- ray showed left greater than right atelectasis. CT scan showed a nodular density in the supraglottic region with a necrotic left jugular digastric lymph node at the IIA level.  The left side was larger than the right.  PHYSICAL EXAMINATION:  GENERAL:  This is an overweight African American female, in no acute distress.  Tracheostomy is in place.  She is on  a trach collar. VITAL SIGNS:  Temperature is 98, heart rate is between 96 and 100, blood pressure was 163/96, respiratory rate was 18 to 22, sats were 94% to 99% on the trach collar. HEAD:  Normocephalic.  Ears; hearing appears normal, nose, and mouth: normal mucosa.  Throat: There is a tracheostomy in place.  Thyroid was not palpated. NECK:  Extremely tender to any touch. RESPIRATORY:  Effort was unlabored. CHEST:  Clear to auscultation.  Chest was nontender to palpation. CARDIAC:  Normal S1 and S2.  Pulses are +2 and equal in upper and lower extremities. ABDOMEN:  Soft and nontender.  Positive bowel sounds.  No palpable hepatosplenomegaly.  No masses, hernias, or abscess. GENITOURINARY/RECTAL:  Deferred. LYMPHATICS:  No femoral lymphadenopathy noted. MUSCULOSKELETAL:  Normal tone.  No joint changes. SKIN:  No  changes. NEUROLOGIC:  The patient is alert, oriented.  Cranial nerves were intact. No focal changes PSYCHIATRIC:  The patient has normal affect and is cooperative.  IMPRESSION: 1. Squamous cell carcinoma of the larynx. 2. Hypertension. 3. Asthma. 4. Tobacco use.  PLAN:  The patient is being prepared for laryngectomy.  We were asked to place a gastrostomy tube.  We will plan to make her n.p.o. and schedule.     Eber Hong, P.A.   ______________________________ Clovis Pu Cristle Jared, M.D.    WDJ/MEDQ  D:  07/11/2010  T:  07/11/2010  Job:  [id number]  Electronically Signed by Sherrie George P.A. on 07/18/2010 02:43:51 PM Electronically Signed by Harriette Bouillon M.D. on 07/23/2010 06:48:23 PM

## 2010-07-25 ENCOUNTER — Encounter (HOSPITAL_COMMUNITY)
Admission: RE | Admit: 2010-07-25 | Discharge: 2010-07-25 | Disposition: A | Discharge status: Home / Self Care | Payer: Medicaid Other | Source: Ambulatory Visit | Attending: Ophthalmology | Admitting: Ophthalmology

## 2010-07-25 DIAGNOSIS — Z01812 Encounter for preprocedural laboratory examination: Secondary | ICD-10-CM | POA: Insufficient documentation

## 2010-07-25 LAB — CBC
Hemoglobin: 12 g/dL (ref 12.0–15.0)
RBC: 4.17 MIL/uL (ref 3.87–5.11)
WBC: 8.9 10*3/uL (ref 4.0–10.5)

## 2010-07-25 LAB — PROTIME-INR
INR: 1.06 (ref 0.00–1.49)
Prothrombin Time: 14 seconds (ref 11.6–15.2)

## 2010-07-25 LAB — BASIC METABOLIC PANEL
GFR calc non Af Amer: >60 mL/min (ref >60)
Potassium: 5.1 mEq/L (ref 3.5–5.1)
Sodium: 137 mEq/L (ref 135–145)

## 2010-07-25 LAB — SURGICAL PCR SCREEN: Staphylococcus aureus: NEGATIVE

## 2010-07-25 NOTE — Consult Note (Signed)
NAME:  Deborah [name], Deborah [name] NO.:  [id number]  MEDICAL RECORD NO.:  [id number]          PATIENT TYPE:  INP  LOCATION:  5506                         FACILITY:  [facility]  PHYSICIAN:  [name], MD            DATE OF BIRTH:  07/02/1955  DATE OF CONSULTATION:  07/15/2010 DATE OF DISCHARGE:                                CONSULTATION   CONSULTING SERVICE:  [facility] and Jefry H. [name], MD  REASON FOR CONSULTATION:  Newly diagnosed invasive squamous cell carcinoma of the supraglottic larynx with bilateral nodal involvement.  HISTORY OF PRESENT ILLNESS:  Deborah [name] is a 55 year old Caucasian African American woman from [place], [place], with history of smoking since 55 years old 1-2 packs a day.  She also has  hypertension.  She developed sore throat for about 10 months and within the past 2 months has been developing adenopathy of the left cervical neck as well.  She had tried multiple courses of antibiotics  in the past without improvement of her symptoms.  She presents to the ER on July 07, 2010, because of breathing problem and severe sore throat.  A neck CT was obtained on presentation and I reviewed myself personally on July 07, 2010, which showed a large supraglottic mass with extension through the perihilar membrane and destruction of the thyroid cartilage anteriorly. The tumor crossed midline into the pre-epiglottic space with probable involvement of the strap musculature.  She has cervical adenopathy with 2 lymph nodes on the left measured 26 x 18 x 29 mm.  There was also supraclavicular node on the left, measured 10 mm, on the right as well as a 2 lymph nodes measured 18 x 14 x 11 mm and posterior level 2 lymph nodes measured 20 x 9 x 16 mm.  She had problems with stridor and respiratory failure requiring urgent intubation.  She underwent on July 08, 2010, direct laryngoscopy and placement of tracheostomy by Dr. Brynda Peon on  July 08, 2010.  The laryngoscopy found that the patient had a mass arising in the laryngeal surface of the epiglottis with the tip of epiglottis appearing to be uninvolved.  The vallecula was clean mucosally but there was fullness submucosally.  The base of the tongue appeared to be uninvolved.  The lesion extended inferiorly towards the glottis, although the glottic structures were not visualized because of the tumor.  The posterior pharyngeal wall and esophageal inlet were all clear.  The piriform sinuses were negative.  Her esophagoscopy was negative.  Pathology case number [id number] was consistent with invasive squamous cell carcinoma.  The patient was evaluated by General Surgery by Dr. Harriette Bouillon, and she underwent placement of open stay-on gastrostomy tube on July 13, 2010.  I evaluated Deborah [name] in her room today with her sister, daughter, and a niece.  She reported that she still has some mild sore throat. However, she has no shortness of breath anymore with the trach in place. She is able to talk with the trach capped.  She has some mild discomfort in the gastrostomy tube placement.  However, no severe pain, erythema, or  bleeding apparent on discharge.  She denies any headache or visual changes.  She has adenopathy in the left more than right cervical lymph node, however, no skin breakthrough.  She denied productive cough, hemoptysis, abdominal pain, swelling, melena, hematochezia, hematuria, skin rash, focal motor weakness, neuropathy, bowel or bladder incontinence, heat or cold intolerance, depression.  The rest of 14- point review of systems is negative.  PAST MEDICAL HISTORY: 1. Hypertension. 2. Possibly history of asthma, however, no history of asthma     exacerbation requiring hospital admission. 3. Smoking abuse.  She denied history of diabetes, hyperlipidemia, CAD, CHF, CVA, DVT, or seizure.  PAST SURGICAL HISTORY:  None.  FAMILY HISTORY:   Her mother died at 40 from MI.  She has history of prior CABG and hypertension.  Mother also had a history of breast cancer in the 39s.  Her father died of early age of unknown cause.  Multiple sibling with hypertension.  SOCIAL HISTORY:  The patient is divorced.  The patient lives with a female partner.  She quit smoking 2 months ago; however, before was 1-2 packs a day since she was 55 years old.  She had history of alcohol, however, she reports it lightly.  Denies IV drug use.  She finished high school. 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  MEDICATIONS:  Lisinopril 10 mg p.o. daily and albuterol p.r.n.  ALLERGIES:  No known drug allergies.  PHYSICAL EXAMINATION:  VITAL SIGNS:  Temperature is 98.5, heart rate 94, respiratory rate 20, blood pressure is 171/101, O2 sat 98% on 28% trach collar. GENERAL:  A thin-appearing woman, in no acute distress.  Well nourished. HEENT:  There was no oropharyngeal lesion. RESPIRATORY:  She has a trach in place which is capped with no respiratory stridor and increased work of breathing.  Lungs were clear bilaterally without wheezing or crackles. LYMPHATICS:  Lymph node survey showed approximately 2 x 3 cm left cervical II/III lymph node and a 2 x 1 cm right level III lymph node.  I could not palpate any supraclavicular adenopathy bilaterally. CARDIAC:  Regular rate and rhythm.  S1 and S2 without murmur, rub, or gallop. ABDOMEN:  Soft, flat, nontender, nondistended.  Gastrostomy tube in place, but tender to palpation.  There was no pedal edema. NEURO:  Nonfocal. SKIN:  Negative. MUSCULOSKELETAL:  Did not show any back pain.  LABORATORY DATA:  On July 12, 2010, showed WBC 5.1, hemoglobin 10.6, platelet count of 291; however, hemoglobin was 12.4 on July 10, 2010. Creatinine 0.84.  LFT within normal limits on July 07, 2010.  ASSESSMENT AND PLAN: 1. History of hypertension, on hypertensive medication by primary  care     physician. 2. History of smoking.  She is off smoking now, and would like to stop     without any assistance of nicotine or antidepressant. 3. 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  She had a staging x-ray of the chest on     July 08, 2010, which did not show any obvious lung mass.  I     discussed with Ms. Giesler and her family that given her clinical T4     staging, up-front chemoradiation therapy may not be very     effective.  In addition, she may end up with an incompetent     larynx given the extend of her tumor.  She may not have a very  competent voice box after definitive chemoradiation therapy anyway.     Therefore, I agree with the patient's decision along with Dr.     [name] to proceed with laryngectomy and bilateral cervical lymph     node dissection.  I discussed with her that the chance of     recurrence is very high with resection alone, which is more than     50%.  So I recommended adjuvant chemoradiation therapy.  Depends on     the finding of pathologic findings, she may or may not need     chemotherapy.  CT has low specificity of about only 70% and     possibility of only about 60-70% as well for pathologic T4 staging.     If she does not have any high risk features such as positive     margins or extra capsular extension, then the only reason for her to     get chemotherapy is a pathologic T4 staging and this can only  find out with     resection.  I discussed with the patient and her family members, I     deferred the detailed chemotherapy until after she has resection     for pathologic diagnosis.  RECOMMENDATIONS:  In the meanwhile, she needs to improve her performance status by ambulating around with physical therapy with additions to start feeding.  As of today, Dr. Lucky Rathke plan for resection is probably August 01, 2010.  I will see the patient again in clinic or cancer center after  resection and history to cover to discuss the risk and benefit adjuvant chemotherapy if she needs that at all.  She may need to have placement into nursing home because she lives with a female partner and I am not sure if he is able to help her out per her daughter.  Her youngest daughter works as a Naval architect and is unable to help her out on the long-term basis.  Therefore, I recommend  a skilled nursing facility.      [name], MD     HH/MEDQ  D:  07/15/2010  T:  07/16/2010  Job:  [id number]  Electronically Signed by [name] MD on 07/25/2010 01:13:07 PM

## 2010-07-26 ENCOUNTER — Ambulatory Visit (HOSPITAL_COMMUNITY)
Admission: RE | Admit: 2010-07-26 | Discharge: 2010-07-26 | Disposition: A | Discharge status: Home / Self Care | Payer: Medicaid Other | Source: Ambulatory Visit | Attending: Dentistry | Admitting: Dentistry

## 2010-07-26 ENCOUNTER — Ambulatory Visit (HOSPITAL_COMMUNITY): Admit: 2010-07-26 | Payer: Self-pay | Admitting: Ophthalmology

## 2010-07-26 DIAGNOSIS — K045 Chronic apical periodontitis: Secondary | ICD-10-CM | POA: Insufficient documentation

## 2010-07-26 DIAGNOSIS — C329 Malignant neoplasm of larynx, unspecified: Secondary | ICD-10-CM | POA: Insufficient documentation

## 2010-07-26 DIAGNOSIS — K083 Retained dental root: Secondary | ICD-10-CM

## 2010-07-26 DIAGNOSIS — M278 Other specified diseases of jaws: Secondary | ICD-10-CM

## 2010-07-26 DIAGNOSIS — K0530 Chronic periodontitis, unspecified: Secondary | ICD-10-CM

## 2010-07-29 NOTE — Op Note (Signed)
NAME:  Deborah [name], Deborah [name]            ACCOUNT NO.:  [id number]  MEDICAL RECORD NO.:  [id number]           PATIENT TYPE:  O  LOCATION:  [facility]SC                         FACILITY:  [facility]  PHYSICIAN:  Charlynne Pander, D.D.S.DATE OF BIRTH:  1956-05-18  DATE OF PROCEDURE:  07/26/2010 DATE OF DISCHARGE:  07/26/2010                              OPERATIVE REPORT   PREOPERATIVE DIAGNOSES: 1. Squamous cell carcinoma of the larynx. 2. Pre-chemoradiation therapy protocol. 3. Chronic periodontitis. 4. Apical periodontitis. 5. Multiple exostoses. 6. Bilateral mandibular lingual tori. 7. Multiple retained root segments.  POSTOPERATIVE DIAGNOSES: 1. Squamous cell carcinoma of the larynx. 2. Pre-chemoradiation therapy protocol. 3. Chronic periodontitis. 4. Apical periodontitis. 5. Multiple exostoses. 6. Bilateral mandibular lingual tori. 7. Multiple retained root segments.  OPERATION: 1. Extraction of remaining teeth (tooth #2, #3, #4, #5, #6, #7, #8,     #9, #10, #11, #13, #14, #18, #19, #20, #21, #22, #23, #24, #25,     #26, #27, #28,). 2. Four quadrants of alveoplasty. 3. Bilateral mandibular lingual tori reductions. 4. Maxillary left buccal exostoses in the area of #15 and #16.  SURGEON:  Charlynne Pander, DDS.  ASSISTANT:  Dr. Wilburn Cornelia, (dental assistant).  ANESTHESIA:  General anesthesia via tracheostomy site.  MEDICATIONS: 1. Ancef 1 gram IV prior to invasive dental procedures. 2. Local anesthesia with total utilization of 6 carpules, each     containing 34 mg of lidocaine with 0.017 mg of epinephrine as well     as 2 carpules, each containing 9 mg of bupivacaine with 0.009 mg of     epinephrine.  SPECIMENS:  There were 23 teeth that were discarded.  DRAINS:  None.  CULTURES:  None.  COMPLICATIONS:  None.  ESTIMATED BLOOD LOSS:  100 mL.  FLUIDS:  Per the Anesthesia Team record.  INDICATIONS:  The patient was recently admitted with squamous cell carcinoma of  the larynx.  The patient with anticipated laryngectomy, to be followed by chemoradiation therapy as indicated.  The patient was seen as part of her pre-chemoradiation therapy dental protocol evaluation to rule out dental infection that may affect the patient's systemic health as well as to prevent future complication such as osteoradionecrosis.  OPERATIVE FINDINGS:  The patient was examined in the operating room #7. The teeth were identified for extraction.  The patient was noted be affected by chronic periodontitis, apical periodontitis, multiple retained root segments, exostoses, and bilateral mandibular lingual tori.  DESCRIPTION OF PROCEDURE:  The patient was brought to the main operating room #7.  The patient was then placed in supine position on the operating room table.  General anesthesia was induced through the tracheostomy site as indicated.  The patient was then prepped and draped in the usual manner for dental medicine procedure.  A time-out was performed.  The patient was identified and procedures were verified.  A throat pack was placed at this time.  The oral cavity was then thoroughly examined with findings noted above.  The patient was then ready for the dental medicine procedure as follows:  Local anesthesia was administered sequentially with a total utilization of 6 carpules, each containing 34 mg of  lidocaine with 0.017 mg of epinephrine as well as 2 carpules, each containing 9 mg of bupivacaine with 0.009 mg of epinephrine.  Maxillary left and right quadrants were first approached.  Anesthesia delivered appropriately utilizing infiltration with the lidocaine with epinephrine.  At this point in time, the maxillary right quadrant was approached.  A 15-blade incision was made from the maxillary right tuberosity and extended to the maxillary left tuberosity.  A surgical flap was then carefully reflected.  Appropriate amounts of buccal and interseptal bone were  removed utilizing a surgical handpiece, bur, and copious amounts of sterile saline.  The teeth were then subluxated with a series of straight elevators.  Tooth #2 and #3 were then removed with a 53R forceps without complications.  Tooth #4, #5, #6, #7, #8, #9, #10, #11 were then removed with a 150 forceps without complications. Retained roots in the area of #13 and #14 were then removed with rongeurs without complications.  Alveoplasty was then performed utilizing rongeurs and bone file.  At this point in time, the buccal exostosis in the upper left quadrant area #15 and #16 was visualized and removed with a surgical handpiece, bur, and copious amounts of sterile saline.  Further alveoloplasty was then performed as indicated utilizing rongeurs and bone file.  The surgical site was then irrigated with copious amounts of sterile saline x4.  Tissues were approximated and trimmed appropriately.  The maxillary right quadrant was then closed from the distal tuberosity and extended to the mesial #8 utilizing 3-0 chromic gut suture in a continuous interrupted suture technique x1.  The maxillary left surgical site was then closed from the maxillary left tuberosity and extended to the mesial #9 utilizing 3-0 chromic gut suture in a continuous interrupted suture technique x1.  At this point in time, the mandibular quadrants were approached.  The patient was given bilateral inferior alveolar nerve blocks and long buccal nerve blocks utilizing the bupivacaine with epinephrine.  Further infiltration was achieved on the facial and palatal aspects utilizing the lidocaine with epinephrine.  At this point in time, mandibular right quadrant was approached, a 15- blade incision was made from the distal of #31 and extended to the distal of #17.  Surgical flap was then carefully reflected.  Appropriate amounts of buccal interseptal bone were removed with a surgical handpiece, bur, and copious amounts of  sterile saline.  The teeth were then subluxated with a series of straight elevators.  Tooth #28, #27, #26, #25, #24, #23, #22, #21, and #20 were then removed with a 151 forceps without complications.  Tooth #19 was then removed with a 23 forceps without complications.  The coronal aspect of tooth #18 was then removed with a 17 forceps, leaving the roots remaining.  Further bone was then removed with a surgical handpiece, bur, and copious amounts of sterile saline.  The roots were then elevated out with a series of Cryers elevators.  Further alveoloplasty was then performed utilizing rongeurs and bone file.  At this point in time, the right mandibular tori were visualized to remove the surgical handpiece, bur, and copious amounts of sterile saline.  Further alveoloplasty was then performed as indicated.  The left mandibular tori were then visualized and removed with a surgical handpiece, bur, and copious amounts of sterile saline. Further alveoloplasty was then performed with rongeurs and bone file. At this point in time, the soft tissues were approximated and trimmed appropriately.  The surgical sites were then irrigated with copious amounts of sterile saline x6.  At this point in time, the mandibular left quadrant was closed from distal of the #17 and extended to the mesial #24 utilizing 3-0 chromic gut suture in a continuous interrupted suture technique x1.  The mandibular right surgical site was then approached and closed from the distal #31 and extended to the mesial #25 utilizing 3-0 chromic gut suture in a continuous interrupted suture technique x1.  Three individual interrupted sutures were then placed to further close surgical site as indicated.  At this point in time, the entire mouth was irrigated with copious amounts of sterile saline.  The patient was examined for complications, seeing none, dental medicine procedure deemed to be complete.  Throat pack was removed at this  time. A series of 4x4 gauzes were then placed in the mouth to aid hemostasis. The patient was then handed over to the Anesthesia Team for final disposition.  After appropriate amount of time, general anesthesia was reversed and the tracheostomy site was restored to preoperative condition.  The patient was then taken to the PACU in stable condition and good vital signs.  The patient should be available for discharge as indicated, and the patient is set for followup laryngectomy later next week with Dr. [name].  The patient was given pain medication utilizing Percocet 5/325 taking 1 to 2 tablets every 4 to 6 hours as needed for pain.     Charlynne Pander, D.D.S.     RFK/MEDQ  D:  07/26/2010  T:  07/27/2010  Job:  [id number]  cc:    Enrigue Catena H. [name], MD  Radene Gunning, MD, PhD  [name], MD   Electronically Signed by Cindra Eves D.D.S. on 07/29/2010 03:23:56 PM

## 2010-07-31 ENCOUNTER — Other Ambulatory Visit: Payer: Self-pay | Admitting: Otolaryngology

## 2010-07-31 ENCOUNTER — Inpatient Hospital Stay (HOSPITAL_COMMUNITY)
Admission: RE | Admit: 2010-07-31 | Discharge: 2010-08-12 | DRG: 013 | Disposition: A | Discharge status: Home Health | Payer: Medicaid Other | Source: Ambulatory Visit | Attending: Otolaryngology | Admitting: Otolaryngology

## 2010-07-31 ENCOUNTER — Inpatient Hospital Stay (HOSPITAL_COMMUNITY): Payer: Medicaid Other

## 2010-07-31 DIAGNOSIS — K9429 Other complications of gastrostomy: Secondary | ICD-10-CM | POA: Diagnosis present

## 2010-07-31 DIAGNOSIS — Y92009 Unspecified place in unspecified non-institutional (private) residence as the place of occurrence of the external cause: Secondary | ICD-10-CM

## 2010-07-31 DIAGNOSIS — Y833 Surgical operation with formation of external stoma as the cause of abnormal reaction of the patient, or of later complication, without mention of misadventure at the time of the procedure: Secondary | ICD-10-CM | POA: Diagnosis present

## 2010-07-31 DIAGNOSIS — I10 Essential (primary) hypertension: Secondary | ICD-10-CM | POA: Diagnosis present

## 2010-07-31 DIAGNOSIS — R0902 Hypoxemia: Secondary | ICD-10-CM | POA: Diagnosis present

## 2010-07-31 DIAGNOSIS — C329 Malignant neoplasm of larynx, unspecified: Secondary | ICD-10-CM | POA: Diagnosis present

## 2010-07-31 DIAGNOSIS — C321 Malignant neoplasm of supraglottis: Principal | ICD-10-CM | POA: Diagnosis present

## 2010-07-31 DIAGNOSIS — Z930 Tracheostomy status: Secondary | ICD-10-CM

## 2010-07-31 DIAGNOSIS — Z87891 Personal history of nicotine dependence: Secondary | ICD-10-CM

## 2010-07-31 DIAGNOSIS — R599 Enlarged lymph nodes, unspecified: Secondary | ICD-10-CM | POA: Diagnosis present

## 2010-07-31 HISTORY — DX: Malignant neoplasm of larynx, unspecified: C32.9

## 2010-07-31 LAB — CBC
MCH: 27.8 pg (ref 26.0–34.0)
Platelets: 424 10*3/uL — ABNORMAL HIGH (ref 150–400)
RBC: 3.2 MIL/uL — ABNORMAL LOW (ref 3.87–5.11)
RDW: 12.6 % (ref 11.5–15.5)
WBC: 12 10*3/uL — ABNORMAL HIGH (ref 4.0–10.5)

## 2010-07-31 LAB — BASIC METABOLIC PANEL
Chloride: 106 mEq/L (ref 96–112)
Creatinine, Ser: 1.05 mg/dL (ref 0.4–1.2)
GFR calc Af Amer: >60 mL/min (ref >60)
GFR calc non Af Amer: 55 mL/min — ABNORMAL LOW (ref >60)
Potassium: 3.8 mEq/L (ref 3.5–5.1)

## 2010-08-01 ENCOUNTER — Inpatient Hospital Stay (HOSPITAL_COMMUNITY): Payer: Medicaid Other

## 2010-08-01 LAB — GLUCOSE, CAPILLARY: Glucose-Capillary: 134 mg/dL — ABNORMAL HIGH (ref 70–99)

## 2010-08-02 LAB — CBC
MCH: 28.1 pg (ref 26.0–34.0)
Platelets: 384 10*3/uL (ref 150–400)
RBC: 3.31 MIL/uL — ABNORMAL LOW (ref 3.87–5.11)
RDW: 12.6 % (ref 11.5–15.5)

## 2010-08-04 ENCOUNTER — Encounter (HOSPITAL_COMMUNITY): Payer: Self-pay | Admitting: Radiology

## 2010-08-04 ENCOUNTER — Inpatient Hospital Stay (HOSPITAL_COMMUNITY): Payer: Medicaid Other

## 2010-08-05 DIAGNOSIS — K08109 Complete loss of teeth, unspecified cause, unspecified class: Secondary | ICD-10-CM

## 2010-08-05 LAB — CBC
HCT: 29.3 % — ABNORMAL LOW (ref 36.0–46.0)
MCHC: 31.7 g/dL (ref 30.0–36.0)
Platelets: 340 10*3/uL (ref 150–400)
RDW: 12.4 % (ref 11.5–15.5)
WBC: 5 10*3/uL (ref 4.0–10.5)

## 2010-08-05 LAB — BASIC METABOLIC PANEL
Calcium: 8.5 mg/dL (ref 8.4–10.5)
GFR calc Af Amer: >60 mL/min (ref >60)
GFR calc non Af Amer: >60 mL/min (ref >60)
Potassium: 3.7 mEq/L (ref 3.5–5.1)
Sodium: 142 mEq/L (ref 135–145)

## 2010-08-06 LAB — TSH: TSH: 0.56 u[IU]/mL (ref 0.350–4.500)

## 2010-08-06 NOTE — Op Note (Signed)
NAME:  Deborah [name], Deborah [name]            ACCOUNT NO.:  [id number]  MEDICAL RECORD NO.:  [id number]           PATIENT TYPE:  I  LOCATION:  2309                         FACILITY:  [facility]  PHYSICIAN:  Devri Kreher H. 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  PROCEDURE:  Total laryngectomy and bilateral modified neck dissection. On the left side, the neck dissection encompassed levels 2, 3,  and limited lower level 5.  On the right, levels 2, 3, and 4.  BLOOD LOSS:  50 mL.  COMPLICATIONS:  No complications.  FINDINGS:  Multiple enlarged lymph nodes in the left jugular chain and several enlarged nodes in inferior neck adjacent to where the thoracic duct entered the internal jugular vein just posterior to the vein in lower level 5 and multiple enlarged lymph nodes in levels 2 and 3 on the right.  SURGEON:  Goku Harb H. [name], MD  ASSISTANT:  Gloris Manchester. Lazarus Salines, MD  ANESTHESIA:  General endotracheal anesthesia was used.  HISTORY:  A 55 year old with a recently diagnosed of supraglottic cancer staged as T4 N2 here for definitive surgical treatment.  Risks, benefits, alternatives, and complications of procedure were explained to the patient and her family members who seemed understand and agreed to surgery.  PROCEDURE:  The patient was taken to the operating room and placed on the operating table in supine position.  Following induction of general endotracheal anesthesia, the tracheostomy was changed and a reinforced endotracheal tube was placed through  the tracheostomy site.  The neck was prepped and draped in a standard fashion.  Incisions were outlined with marking pen.  An U-shaped apron-type incision crossing the anterior neck about 3-4 cm above the tracheostomy site and up to the mastoid tips bilaterally.  A circumferential incision around the tracheostomy was also outlined.  Electrocautery was used to incise through the skin incisions and down to a level of the platysma muscle.  Subplatysmal flaps were elevated superiorly to the angles of the mandible, anteriorly to above the hyoid bone, and inferiorly to the clavicle encompassing the new stoma sites. 1. Bilateral modified radical neck dissection.  The left neck     dissection was performed first and this was kept en bloc with the     total laryngectomy.  The dissection encompassed levels 2 through 4     and the anterior most aspect of level 5 where there was some     palpable nodes.  All the fibrofatty lymph node bearing tissue was     dissected while identifying and preserving the following     structures:  The internal jugular vein, the vagus nerve, the common     carotid, internal, and external carotid arteries.  The     sternocleidomastoid muscle, the  hypoglossal nerve, and the spinal     accessory nerve were all preserved as well.  The limits of     dissection were as follows:  Superiorly, the angle of the mandible     and parotid tail; posterosuperiorly, the fascia overlying the     splenius capitis and levator scapular muscles; posteriorly, the     posterior border of the sternocleidomastoid muscle and the     cutaneous branches of the cervical plexus; inferiorly, the     clavicle; medially, the contents of the carotid sheath.     Anteriorly, the specimen was kept en bloc with the strap muscles as     part of the laryngectomy.  As the specimen was brought forward, the     lymphatic ducts that were adjacent entering into the lower part of     the internal jugular vein were  ligated between clamps and divided.     Multiple enlarged nodes were identified as discussed above.  1. Right modified radical neck dissection.  The right side was     palpated after the left neck and laryngectomy were completed and     there were some level 2 nodes that were felt to be quite large     greater than 2 cm as the CT scan had revealed preoperatively.  A     similar dissection was then accomplished on the right side,     including levels 2, 3, and 4.  The structures that were preserved     were identical to the left side as were the limits of dissection.     That specimen was sent separately for pathologic evaluation with     marking sutures identifying levels 2, 3, and 4.  1. Total laryngectomy.  After the left neck dissection was completed     and the specimen was brought forward attached to the left side of     the strap muscles, the dissection was then accomplished inferior to     the tracheostomy site.  The tracheostomy site was kept within the     surgical specimen.  The anterior inferior strap muscles were     transected and the trachea was exposed.  The right lobe of the     thyroid was reflected laterally and preserved with its blood     supply.  The left lobe was taken with its blood supply with the     specimen.  An inferior parathyroid on the left was identified and     preserved with its blood supply and reflected laterally.  The     trachea was entered between what appeared to be the second and     third ring, although it may have been the third and fourth.  It was     difficult to tell because of the previous tracheostomy.  The     endotracheal tube was removed and a fresh reinforced tube was then     placed through the stomal opening and into the trachea.  The     specimen was brought superiorly as the party wall between the     trachea and the esophagus was dissected using electrocautery and     brought up towards the postcricoid mucosa.  Viewing from the      inferior stump of the trachea, there was no evidence of tumor close     to the previous tracheostomy or to the new resection margin.  The     suprahyoid  musculature was then divided and the greater horn of the     hyoid and the lateral thyroid lamella were skeletonized from the     constrictor muscles.  The piriform sinus was entered on the right     side and was clear of any signs of tumor.  The inferior mucosal     cuts were then accomplished using scissors.  Mucosal cuts were then     brought around the bottom to the left side towards the piriform.     Again, there was significant distance from the mucosal cuts to the     tumor that was visible.  There was what appeared to be     preepiglottic involvement of the tumor, so the base of tongue     mucosal cuts were kept relatively high.  These cuts were made using     direct visualization with palpation of the tumor to keep good     margins.  An additional section of superior margin was taken     separately and sent for frozen section and this was negative for     carcinoma.  The laryngectomy was removed and sent for pathologic     evaluation after dividing posteriorly and inspecting the tumor.     There was a large exophytic and bulky anterior supraglottic tumor     with a significant ulcerative component superiorly and some     submucosal involvement of the preepiglottic space but grossly all     the margins seemed clear.  The wound was irrigated.  The 4-0 silk     ties, electrocautery, and bipolar cautery were all used throughout     the case for hemostasis.  The neopharynx was closed with a     trifurcation-type running suture of 3-0 chromic.  There was a     watertight closure and a bulb syringe was used to force saline     through the pharynx.  There was nice easy passage of saline without     any evidence of leak.  Secondary layer of closure was accomplished     using the strap muscles and then the right lobe of the thyroid.      Two 15-French round JP drains were left in the wound, exited     through separate stab incision and secured in place with nylon     suture.  The skin incision was reapproximated using interrupted     chromic suture on the platysma layer and staples on the skin.  The     stoma was matured using     combination of horizontal mattress suturing using 3-0 Vicryl and     some 4-0 chromic as well on the skin mucosal line.  Bacitracin was     applied to the suture lines and the drain sites and the patient was     then awakened, extubated, transferred to PACU in stable condition.     Shweta Aman H. [name], MD     JHR/MEDQ  D:  07/31/2010  T:  08/01/2010  Job:  [id number]  Electronically Signed by Serena Colonel MD on 08/06/2010 10:52:40 AM

## 2010-08-08 ENCOUNTER — Inpatient Hospital Stay (HOSPITAL_COMMUNITY): Payer: Medicaid Other

## 2010-08-08 LAB — BASIC METABOLIC PANEL
CO2: 34 mEq/L — ABNORMAL HIGH (ref 19–32)
Chloride: 101 mEq/L (ref 96–112)
Creatinine, Ser: 0.68 mg/dL (ref 0.4–1.2)
GFR calc Af Amer: >60 mL/min (ref >60)
Sodium: 143 mEq/L (ref 135–145)

## 2010-08-08 LAB — CBC
Hemoglobin: 10.6 g/dL — ABNORMAL LOW (ref 12.0–15.0)
Platelets: 447 10*3/uL — ABNORMAL HIGH (ref 150–400)
RBC: 3.75 MIL/uL — ABNORMAL LOW (ref 3.87–5.11)
WBC: 5.7 10*3/uL (ref 4.0–10.5)

## 2010-08-09 ENCOUNTER — Other Ambulatory Visit (HOSPITAL_COMMUNITY): Payer: Self-pay

## 2010-08-09 LAB — CBC
Platelets: 389 10*3/uL (ref 150–400)
RBC: 3.65 MIL/uL — ABNORMAL LOW (ref 3.87–5.11)
RDW: 12.8 % (ref 11.5–15.5)
WBC: 6 10*3/uL (ref 4.0–10.5)

## 2010-08-09 LAB — BASIC METABOLIC PANEL
BUN: 8 mg/dL (ref 6–23)
Chloride: 103 mEq/L (ref 96–112)
GFR calc Af Amer: >60 mL/min (ref >60)
GFR calc non Af Amer: >60 mL/min (ref >60)
Potassium: 3.9 mEq/L (ref 3.5–5.1)
Sodium: 141 mEq/L (ref 135–145)

## 2010-08-13 NOTE — Discharge Summary (Signed)
  NAME:  Deborah [name], Deborah [name]            ACCOUNT NO.:  [id number]  MEDICAL RECORD NO.:  [id number]           PATIENT TYPE:  I  LOCATION:  5148                         FACILITY:  [facility]  PHYSICIAN:  Meghan Tiemann H. [name], MD     DATE OF BIRTH:  1955/06/17  DATE OF ADMISSION:  07/31/2010 DATE OF DISCHARGE:  08/12/2010                              DISCHARGE SUMMARY   ADMISSION DIAGNOSIS:  Squamous cell carcinoma of the supraglottic larynx with lymphadenopathy bilaterally in the neck.  DIAGNOSIS:  Status post total laryngectomy, bilateral neck dissection.  PROCEDURE:  During stay with bilateral modified neck dissection and total laryngectomy.  CONSULTATIONS:  During stay with the hospitalist service for hypertension, General Surgery, and Interventional Radiology for care of her gastrostomy tube.  HISTORY:  A 55 year old was recently diagnosed with supraglottic cancer. She was brought in on February 15, underwent the above-mentioned operative procedure.  She was transferred to the surgical intensive care unit postoperatively where she remained for several days.  She had 2 JP drains, one in each side of the neck.  She had a trach tube in her stoma for a couple of days due to some swelling and obstruction, but this resolved nicely during the hospitalization.  On postoperative day #5, the drains were removed.  There is significant superior pitting edema throughout the hospitalization that slowly started resolving towards the end.  There was never evidence of hematoma or abscess.  TSH was checked during the stay and was normal.  Nutritional status was monitored and gastrostomy tube feeds were given according to the Nutritional Service. During the stay, the patient was seen by the hospitalist service for some hypertension that was out of control and adjustments were made in her medications accordingly.  On postoperative day #7, she was started on a liquid diet, which she tolerated well and this was  advanced over the following 5 days until she was on a full liquid diet.  There was 1 day during the stay where she was having significant edema, some crusting, and obstruction of her stoma, and she was brought to the step- down unit, but then she was discharged back to the floor shortly thereafter.  She is discharged to home on February 27, instructed to follow a full liquid diet, instructed on stoma care and gastrostomy care, instructed to supplement her diet with gastrostomy tube in order to get sufficient calories and protein using Jevity as we were using in the hospital.  She is prescribed lisinopril and metoprolol for hypertension according to the hospitalist service and was prescribed Lortab elixir for pain.  She is to follow up with Dr. [name] at the end of this week and to follow up at the [facility] with Dr. Gaylyn Rong and Dr. Mitzi Hansen where she is going to undergo postoperative radiation and possibly chemo.     Rosalie Buenaventura H. [name], MD    JHR/MEDQ  D:  08/12/2010  T:  08/12/2010  Job:  [id number]  Electronically Signed by Serena Colonel MD on 08/13/2010 01:30:36 PM

## 2010-08-14 ENCOUNTER — Emergency Department (HOSPITAL_COMMUNITY): Payer: Medicaid Other

## 2010-08-14 ENCOUNTER — Emergency Department (HOSPITAL_COMMUNITY)
Admission: EM | Admit: 2010-08-14 | Discharge: 2010-08-14 | Disposition: A | Discharge status: Home / Self Care | Payer: Medicaid Other | Attending: Emergency Medicine | Admitting: Emergency Medicine

## 2010-08-14 ENCOUNTER — Encounter (HOSPITAL_COMMUNITY): Payer: Self-pay | Admitting: Radiology

## 2010-08-14 DIAGNOSIS — I10 Essential (primary) hypertension: Secondary | ICD-10-CM | POA: Insufficient documentation

## 2010-08-14 DIAGNOSIS — K9429 Other complications of gastrostomy: Secondary | ICD-10-CM | POA: Insufficient documentation

## 2010-08-14 DIAGNOSIS — R61 Generalized hyperhidrosis: Secondary | ICD-10-CM | POA: Insufficient documentation

## 2010-08-14 DIAGNOSIS — Z8521 Personal history of malignant neoplasm of larynx: Secondary | ICD-10-CM | POA: Insufficient documentation

## 2010-08-14 DIAGNOSIS — Y838 Other surgical procedures as the cause of abnormal reaction of the patient, or of later complication, without mention of misadventure at the time of the procedure: Secondary | ICD-10-CM | POA: Insufficient documentation

## 2010-08-14 DIAGNOSIS — R079 Chest pain, unspecified: Secondary | ICD-10-CM | POA: Insufficient documentation

## 2010-08-14 DIAGNOSIS — R0602 Shortness of breath: Secondary | ICD-10-CM | POA: Insufficient documentation

## 2010-08-14 HISTORY — DX: Essential (primary) hypertension: I10

## 2010-08-14 HISTORY — DX: Bronchitis, not specified as acute or chronic: J40

## 2010-08-14 HISTORY — DX: Acquired absence of larynx: Z90.02

## 2010-08-14 HISTORY — DX: Tracheostomy status: Z93.0

## 2010-08-14 LAB — POCT I-STAT, CHEM 8
BUN: 13 mg/dL (ref 6–23)
Calcium, Ion: 1.15 mmol/L (ref 1.12–1.32)
Chloride: 101 mEq/L (ref 96–112)
Creatinine, Ser: 0.9 mg/dL (ref 0.4–1.2)

## 2010-08-14 LAB — CBC
Hemoglobin: 10.5 g/dL — ABNORMAL LOW (ref 12.0–15.0)
MCV: 87.2 fL (ref 78.0–100.0)
Platelets: 379 10*3/uL (ref 150–400)
RBC: 3.66 MIL/uL — ABNORMAL LOW (ref 3.87–5.11)
WBC: 8 10*3/uL (ref 4.0–10.5)

## 2010-08-14 LAB — DIFFERENTIAL
Eosinophils Absolute: 0.2 10*3/uL (ref 0.0–0.7)
Lymphocytes Relative: 36 % (ref 12–46)
Lymphs Abs: 2.9 10*3/uL (ref 0.7–4.0)
Monocytes Relative: 9 % (ref 3–12)
Neutro Abs: 4.2 10*3/uL (ref 1.7–7.7)
Neutrophils Relative %: 53 % (ref 43–77)

## 2010-08-14 LAB — POCT CARDIAC MARKERS
Myoglobin, poc: 58.7 ng/mL (ref 12–200)
Myoglobin, poc: 66.3 ng/mL (ref 12–200)
Troponin i, poc: <0.05 ng/mL (ref 0.00–0.09)
Troponin i, poc: <0.05 ng/mL (ref 0.00–0.09)

## 2010-08-14 MED ORDER — IOHEXOL 350 MG/ML SOLN
100.0000 mL | Freq: Once | INTRAVENOUS | Status: AC | PRN
  Administered 2010-08-14: 100 mL via INTRAVENOUS

## 2010-08-15 ENCOUNTER — Encounter (HOSPITAL_BASED_OUTPATIENT_CLINIC_OR_DEPARTMENT_OTHER): Payer: Self-pay | Admitting: Oncology

## 2010-08-15 ENCOUNTER — Other Ambulatory Visit: Payer: Self-pay | Admitting: Oncology

## 2010-08-15 DIAGNOSIS — D649 Anemia, unspecified: Secondary | ICD-10-CM

## 2010-08-15 DIAGNOSIS — C321 Malignant neoplasm of supraglottis: Secondary | ICD-10-CM

## 2010-08-15 DIAGNOSIS — C329 Malignant neoplasm of larynx, unspecified: Secondary | ICD-10-CM

## 2010-08-15 LAB — CBC WITH DIFFERENTIAL/PLATELET
BASO%: 1.3 % (ref 0.0–2.0)
EOS%: 4.7 % (ref 0.0–7.0)
HCT: 33 % — ABNORMAL LOW (ref 34.8–46.6)
LYMPH%: 47 % (ref 14.0–49.7)
MCH: 28.8 pg (ref 25.1–34.0)
MCHC: 33 g/dL (ref 31.5–36.0)
MONO%: 7.9 % (ref 0.0–14.0)
NEUT%: 39.1 % (ref 38.4–76.8)
Platelets: 394 10*3/uL (ref 145–400)
RBC: 3.78 10*6/uL (ref 3.70–5.45)

## 2010-08-15 LAB — COMPREHENSIVE METABOLIC PANEL
ALT: 13 U/L (ref 0–35)
AST: 17 U/L (ref 0–37)
Alkaline Phosphatase: 79 U/L (ref 39–117)
Creatinine, Ser: 1.01 mg/dL (ref 0.40–1.20)
Total Bilirubin: 0.4 mg/dL (ref 0.3–1.2)

## 2010-08-15 NOTE — Consult Note (Signed)
NAME:  HILDRETH, ORSAK NO.:  [id number]  MEDICAL RECORD NO.:  [id number]           PATIENT TYPE:  I  LOCATION:  5122                         FACILITY:  [facility]  PHYSICIAN:  Lonia Blood, M.D.       DATE OF BIRTH:  06/25/1955  DATE OF CONSULTATION: DATE OF DISCHARGE:                                CONSULTATION   REASON FOR CONSULTATION:  Elevated blood pressure readings.  PRESENT ILLNESS:  Mrs. Nappi is an unfortunate 55 year old woman with recent diagnosis squamous cell carcinoma of the larynx who was admitted and underwent laryngectomy and radical neck dissection on August 01, 2010 by Dr. Serena Colonel.  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  In the postoperative period, it was noted that she was having elevated blood pressure readings, so we were called to help with management of her hypertension.  The patient was still experiencing some pain at the surgical site and she was very anxious about her new situation where she could not talk.  The blood pressure readings as of today were 180/110, 196/105, 176/106, 182/98.  Of note, Ms. Kumari has chronic hypertension for which she was taking lisinopril prior to her surgical procedure.  PAST MEDICAL HISTORY:  Asthma and hypertension.  FAMILY HISTORY:  The patient's mother died at age 89 from massive heart attack.  Father had died of unknown cause.  SOCIAL HISTORY:  The patient is divorced and she lives alone.  She used to smoke cigarettes a pack a day  but quit.  She quit alcohol, and she is currently unemployed.  ALLERGIES:  No known drug allergies.  CURRENT MEDICATIONS:  Cefazolin, fentanyl, fluconazole, free water, hydralazine, hydromorphone, lisinopril, Protonix, lorazepam.  PHYSICAL EXAMINATION:  VITAL SIGNS: Temperature is 99, blood pressure is 102/98, respirations 18, saturating 96% on  20% T-collar GENERAL:  The patient is currently asleep but she is arousable, shakes her head when asked if she has chest pain, shakes her head about shortness of breath as no.  She acknowledges some pain in the neck. HEENT:  Head seems to be normocephalic, atraumatic. CHEST:  Clear to auscultation without wheeze, rhonchi, or crackles. HEART:  Regular rate and rhythm without murmurs, rubs, or gallops. ABDOMEN:  Soft, nontender.  There is a gastrostomy tube in place which has been injected on August 01, 2010 showing a leak below the skin. The gastrostomy tube was placed for the first time by Dr. Luisa Hart on July 13, 2010.  LABORATORY VALUES:  Latest BMET shows a sodium 142, potassium 3.6, chloride 102, bicarbonate 33, BUN 6, creatinine 0.6, glucose 95, white blood cell count is 5, hemoglobin 9.3, platelet count 340.  IMPRESSION AND RECOMMENDATIONS:  This is a 55 year old woman with status post laryngectomy and radical neck dissection for which we were called to help with management of hypertension.  In the current setting, I think the best option is to add a beta-blocker to the ACE inhibitor and control the pain and anxiety.  Otherwise we will follow along with you and adjust the medication as needed.  Thank you for the consultation.     Lonia Blood, M.D.     SL/MEDQ  D:  08/06/2010  T:  08/06/2010  Job:  [id number]  cc:   Enrigue Catena H. [name], MD  Electronically Signed by Lonia Blood M.D. on 08/15/2010 05:33:01 PM

## 2010-08-16 ENCOUNTER — Inpatient Hospital Stay (HOSPITAL_COMMUNITY): Admission: RE | Admit: 2010-08-16 | Payer: Self-pay | Source: Ambulatory Visit | Admitting: Neurosurgery

## 2010-08-19 ENCOUNTER — Ambulatory Visit: Payer: Medicaid Other | Attending: Radiation Oncology | Admitting: Radiation Oncology

## 2010-08-19 DIAGNOSIS — R110 Nausea: Secondary | ICD-10-CM | POA: Insufficient documentation

## 2010-08-19 DIAGNOSIS — C321 Malignant neoplasm of supraglottis: Secondary | ICD-10-CM | POA: Insufficient documentation

## 2010-08-19 DIAGNOSIS — R059 Cough, unspecified: Secondary | ICD-10-CM | POA: Insufficient documentation

## 2010-08-19 DIAGNOSIS — R51 Headache: Secondary | ICD-10-CM | POA: Insufficient documentation

## 2010-08-19 DIAGNOSIS — Z510 Encounter for antineoplastic radiation therapy: Secondary | ICD-10-CM | POA: Insufficient documentation

## 2010-08-19 DIAGNOSIS — R05 Cough: Secondary | ICD-10-CM | POA: Insufficient documentation

## 2010-08-20 ENCOUNTER — Ambulatory Visit (HOSPITAL_COMMUNITY): Payer: Self-pay | Admitting: Dentistry

## 2010-08-20 DIAGNOSIS — K08109 Complete loss of teeth, unspecified cause, unspecified class: Secondary | ICD-10-CM

## 2010-08-26 LAB — CBC
HCT: 39.8 % (ref 36.0–46.0)
MCH: 30 pg (ref 26.0–34.0)
MCHC: 33.4 g/dL (ref 30.0–36.0)
MCV: 89.8 fL (ref 78.0–100.0)
RDW: 12.3 % (ref 11.5–15.5)
WBC: 10.1 10*3/uL (ref 4.0–10.5)

## 2010-08-26 LAB — DIFFERENTIAL
Basophils Absolute: 0 10*3/uL (ref 0.0–0.1)
Eosinophils Relative: 1 % (ref 0–5)
Lymphocytes Relative: 32 % (ref 12–46)
Monocytes Absolute: 0.7 10*3/uL (ref 0.1–1.0)

## 2010-08-27 ENCOUNTER — Encounter (HOSPITAL_COMMUNITY)
Admission: RE | Admit: 2010-08-27 | Discharge: 2010-08-27 | Disposition: A | Discharge status: Home / Self Care | Payer: Medicaid Other | Source: Ambulatory Visit | Attending: Oncology | Admitting: Oncology

## 2010-08-27 DIAGNOSIS — C329 Malignant neoplasm of larynx, unspecified: Secondary | ICD-10-CM | POA: Insufficient documentation

## 2010-08-27 DIAGNOSIS — Z9089 Acquired absence of other organs: Secondary | ICD-10-CM | POA: Insufficient documentation

## 2010-08-27 MED ORDER — FLUDEOXYGLUCOSE F - 18 (FDG) INJECTION
16.4000 | Freq: Once | INTRAVENOUS | Status: AC | PRN
  Administered 2010-08-27: 16.4 via INTRAVENOUS

## 2010-09-03 ENCOUNTER — Encounter (HOSPITAL_BASED_OUTPATIENT_CLINIC_OR_DEPARTMENT_OTHER): Payer: Self-pay | Admitting: Oncology

## 2010-09-03 ENCOUNTER — Other Ambulatory Visit: Payer: Self-pay | Admitting: Oncology

## 2010-09-03 DIAGNOSIS — C321 Malignant neoplasm of supraglottis: Secondary | ICD-10-CM

## 2010-09-03 DIAGNOSIS — D649 Anemia, unspecified: Secondary | ICD-10-CM

## 2010-09-03 DIAGNOSIS — D72819 Decreased white blood cell count, unspecified: Secondary | ICD-10-CM

## 2010-09-03 DIAGNOSIS — Z5111 Encounter for antineoplastic chemotherapy: Secondary | ICD-10-CM

## 2010-09-03 LAB — CBC WITH DIFFERENTIAL/PLATELET
Basophils Absolute: 0 10*3/uL (ref 0.0–0.1)
EOS%: 4.2 % (ref 0.0–7.0)
HGB: 10.1 g/dL — ABNORMAL LOW (ref 11.6–15.9)
MCH: 27.9 pg (ref 25.1–34.0)
MCV: 87.8 fL (ref 79.5–101.0)
MONO%: 8.7 % (ref 0.0–14.0)
NEUT%: 36.4 % — ABNORMAL LOW (ref 38.4–76.8)
RDW: 13.9 % (ref 11.2–14.5)

## 2010-09-04 ENCOUNTER — Other Ambulatory Visit: Payer: Self-pay | Admitting: Oncology

## 2010-09-04 DIAGNOSIS — C321 Malignant neoplasm of supraglottis: Secondary | ICD-10-CM

## 2010-09-05 LAB — PROTEIN ELECTROPHORESIS, SERUM
Albumin ELP: 51.4 % — ABNORMAL LOW (ref 55.8–66.1)
Alpha-1-Globulin: 4.5 % (ref 2.9–4.9)

## 2010-09-05 LAB — IRON AND TIBC: Iron: 69 ug/dL (ref 42–145)

## 2010-09-05 LAB — KAPPA/LAMBDA LIGHT CHAINS
Kappa free light chain: 2.35 mg/dL — ABNORMAL HIGH (ref 0.33–1.94)
Kappa:Lambda Ratio: 1.22 (ref 0.26–1.65)
Lambda Free Lght Chn: 1.92 mg/dL (ref 0.57–2.63)

## 2010-09-05 LAB — COMPREHENSIVE METABOLIC PANEL
BUN: 10 mg/dL (ref 6–23)
CO2: 27 mEq/L (ref 19–32)
Calcium: 8.8 mg/dL (ref 8.4–10.5)
Chloride: 107 mEq/L (ref 96–112)
Creatinine, Ser: 0.72 mg/dL (ref 0.40–1.20)
Total Bilirubin: 0.3 mg/dL (ref 0.3–1.2)

## 2010-09-05 LAB — MAGNESIUM: Magnesium: 2.2 mg/dL (ref 1.5–2.5)

## 2010-09-08 LAB — RAPID STREP SCREEN (MED CTR MEBANE ONLY): Streptococcus, Group A Screen (Direct): NEGATIVE

## 2010-09-09 ENCOUNTER — Encounter (HOSPITAL_BASED_OUTPATIENT_CLINIC_OR_DEPARTMENT_OTHER): Payer: Self-pay | Admitting: Oncology

## 2010-09-09 ENCOUNTER — Other Ambulatory Visit: Payer: Self-pay | Admitting: Oncology

## 2010-09-09 ENCOUNTER — Ambulatory Visit: Payer: Self-pay

## 2010-09-09 DIAGNOSIS — D649 Anemia, unspecified: Secondary | ICD-10-CM

## 2010-09-09 DIAGNOSIS — C321 Malignant neoplasm of supraglottis: Secondary | ICD-10-CM

## 2010-09-09 LAB — CBC WITH DIFFERENTIAL/PLATELET
BASO%: 0.4 % (ref 0.0–2.0)
Basophils Absolute: 0 10*3/uL (ref 0.0–0.1)
HCT: 34.3 % — ABNORMAL LOW (ref 34.8–46.6)
HGB: 11.1 g/dL — ABNORMAL LOW (ref 11.6–15.9)
MCHC: 32.4 g/dL (ref 31.5–36.0)
MONO#: 0.3 10*3/uL (ref 0.1–0.9)
NEUT#: 2.3 10*3/uL (ref 1.5–6.5)
NEUT%: 49.1 % (ref 38.4–76.8)
WBC: 4.6 10*3/uL (ref 3.9–10.3)
lymph#: 1.8 10*3/uL (ref 0.9–3.3)

## 2010-09-09 LAB — COMPREHENSIVE METABOLIC PANEL
AST: 17 U/L (ref 0–37)
Albumin: 4.3 g/dL (ref 3.5–5.2)
BUN: 12 mg/dL (ref 6–23)
Calcium: 9.2 mg/dL (ref 8.4–10.5)
Chloride: 103 mEq/L (ref 96–112)
Potassium: 4.2 mEq/L (ref 3.5–5.3)

## 2010-09-10 ENCOUNTER — Encounter (HOSPITAL_BASED_OUTPATIENT_CLINIC_OR_DEPARTMENT_OTHER): Payer: Self-pay | Admitting: Oncology

## 2010-09-10 DIAGNOSIS — R110 Nausea: Secondary | ICD-10-CM

## 2010-09-10 DIAGNOSIS — C321 Malignant neoplasm of supraglottis: Secondary | ICD-10-CM

## 2010-09-10 DIAGNOSIS — Z5111 Encounter for antineoplastic chemotherapy: Secondary | ICD-10-CM

## 2010-09-10 DIAGNOSIS — E441 Mild protein-calorie malnutrition: Secondary | ICD-10-CM

## 2010-09-12 ENCOUNTER — Ambulatory Visit: Payer: Medicaid Other | Attending: Oncology

## 2010-09-12 DIAGNOSIS — R1310 Dysphagia, unspecified: Secondary | ICD-10-CM | POA: Insufficient documentation

## 2010-09-12 DIAGNOSIS — R491 Aphonia: Secondary | ICD-10-CM | POA: Insufficient documentation

## 2010-09-12 DIAGNOSIS — IMO0001 Reserved for inherently not codable concepts without codable children: Secondary | ICD-10-CM | POA: Insufficient documentation

## 2010-09-13 ENCOUNTER — Ambulatory Visit (HOSPITAL_COMMUNITY)
Admission: RE | Admit: 2010-09-13 | Discharge: 2010-09-13 | Disposition: A | Discharge status: Home / Self Care | Payer: Medicaid Other | Source: Ambulatory Visit | Attending: Oncology | Admitting: Oncology

## 2010-09-13 DIAGNOSIS — Z79899 Other long term (current) drug therapy: Secondary | ICD-10-CM | POA: Insufficient documentation

## 2010-09-13 DIAGNOSIS — C321 Malignant neoplasm of supraglottis: Secondary | ICD-10-CM

## 2010-09-16 ENCOUNTER — Other Ambulatory Visit: Payer: Self-pay | Admitting: Oncology

## 2010-09-16 ENCOUNTER — Emergency Department (HOSPITAL_COMMUNITY)
Admission: EM | Admit: 2010-09-16 | Discharge: 2010-09-16 | Discharge status: Against Medical Advice | Payer: Medicaid Other | Attending: Emergency Medicine | Admitting: Emergency Medicine

## 2010-09-16 ENCOUNTER — Encounter (HOSPITAL_BASED_OUTPATIENT_CLINIC_OR_DEPARTMENT_OTHER): Payer: Self-pay | Admitting: Oncology

## 2010-09-16 DIAGNOSIS — Z930 Tracheostomy status: Secondary | ICD-10-CM | POA: Insufficient documentation

## 2010-09-16 DIAGNOSIS — C321 Malignant neoplasm of supraglottis: Secondary | ICD-10-CM

## 2010-09-16 DIAGNOSIS — Y833 Surgical operation with formation of external stoma as the cause of abnormal reaction of the patient, or of later complication, without mention of misadventure at the time of the procedure: Secondary | ICD-10-CM | POA: Insufficient documentation

## 2010-09-16 DIAGNOSIS — K9420 Gastrostomy complication, unspecified: Secondary | ICD-10-CM | POA: Insufficient documentation

## 2010-09-16 DIAGNOSIS — I10 Essential (primary) hypertension: Secondary | ICD-10-CM | POA: Insufficient documentation

## 2010-09-16 DIAGNOSIS — D649 Anemia, unspecified: Secondary | ICD-10-CM

## 2010-09-16 LAB — COMPREHENSIVE METABOLIC PANEL
AST: 15 U/L (ref 0–37)
Albumin: 4.3 g/dL (ref 3.5–5.2)
BUN: 10 mg/dL (ref 6–23)
CO2: 28 mEq/L (ref 19–32)
Calcium: 9.2 mg/dL (ref 8.4–10.5)
Chloride: 101 mEq/L (ref 96–112)
Potassium: 3.6 mEq/L (ref 3.5–5.3)

## 2010-09-16 LAB — CBC WITH DIFFERENTIAL/PLATELET
Basophils Absolute: 0 10*3/uL (ref 0.0–0.1)
EOS%: 3.1 % (ref 0.0–7.0)
Eosinophils Absolute: 0.1 10*3/uL (ref 0.0–0.5)
HCT: 35 % (ref 34.8–46.6)
HGB: 11.5 g/dL — ABNORMAL LOW (ref 11.6–15.9)
MCH: 29 pg (ref 25.1–34.0)
MONO#: 0.3 10*3/uL (ref 0.1–0.9)
NEUT#: 2.2 10*3/uL (ref 1.5–6.5)
NEUT%: 65 % (ref 38.4–76.8)
RDW: 14.3 % (ref 11.2–14.5)
WBC: 3.4 10*3/uL — ABNORMAL LOW (ref 3.9–10.3)
lymph#: 0.8 10*3/uL — ABNORMAL LOW (ref 0.9–3.3)

## 2010-09-16 LAB — MAGNESIUM: Magnesium: 2.1 mg/dL (ref 1.5–2.5)

## 2010-09-17 ENCOUNTER — Encounter (HOSPITAL_BASED_OUTPATIENT_CLINIC_OR_DEPARTMENT_OTHER): Payer: Self-pay | Admitting: Oncology

## 2010-09-17 DIAGNOSIS — C321 Malignant neoplasm of supraglottis: Secondary | ICD-10-CM

## 2010-09-17 DIAGNOSIS — Z5111 Encounter for antineoplastic chemotherapy: Secondary | ICD-10-CM

## 2010-09-17 HISTORY — PX: PORTACATH PLACEMENT: SHX2246

## 2010-09-19 NOTE — Progress Notes (Signed)
NAME:  [name], BLUMENTHAL            ACCOUNT NO.:  [id number]  MEDICAL RECORD NO.:  [id number]          PATIENT TYPE:  INP  LOCATION:  5506                         FACILITY:  [facility]  PHYSICIAN:  Kela Millin, M.D.DATE OF BIRTH:  March 19, 1956                                PROGRESS NOTE   CURRENT DIAGNOSES: 1. Squamous cell carcinoma of the supraglottic larynx 2. Hypertension. 3. Dysphagia - status post open gastrostomy tube. 4. History of asthma. 5. History of tobacco abuse - The patient quit tobacco recently. 6. Hypercarbic respiratory failure - resolved. 7. Status post emergent tracheostomy on July 08, 2010 per Dr.     [name].  PROCEDURES AND STUDIES: 1. Chest x-ray on January 22 - no active cardiopulmonary disease. 2. Followup chest x-ray on January 23 - tracheostomy tube in place, no     pneumothorax. 3. CT scan of neck with contrast on January 22 - Ill-defined nodular     soft tissue density in the supraglottic region involving the     vallecula, left side greater than right.  Supraglottic carcinoma     cannot be excluded.  Direct visualization is recommended.  Mild     necrotic left jugulodigastric lymphadenopathy.  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 4. Panorex on July 06, 2010 - Periapical lucency as associated with     retained roots of the left maxillary molar.  Periapical abscess is     not excluded.  Dental cavity of the left mandibular second molar.     Suspected maxillary sinusitis. 5. Emergency tracheostomy on July 08, 2010 by Dr. [name]. 6. Direct laryngoscopy with biopsy of supraglottic mass on July 08, 2010 by Dr. [name]. 7. Esophagoscopy on July 08, 2010 by Dr. [name]. 8. Laryngeal biopsy of supraglottic mass - invasive squamous cell     carcinoma.  CONSULTATIONS: 1. ENT - Dr. [name]. 2. [facility]. 3. Dentist - Dr. Kristin Bruins. 4. General Surgery - Dr. Luisa Hart. 5. Oncology - Dr. Gaylyn Rong. 6.  Radiation oncology.  BRIEF HISTORY: The patient is a 55 year old black female with above listed medical problems who presented with complaints of hemoptysis/pharyngitis.  It was noted that on June 20, 2010 she initially presented with complaints of cough, wheezing, and hemoptysis.  At the time, she was seen in the ED.  A chest x-ray was done which showed possible mild pneumonia.  She was treated with a Z-Pak and instructed to follow up at [facility].  She came back to the ED because of ongoing cough and hemoptysis.  She denied fevers and no chills.  She reported a 40-50 pound weight loss in the past year.  She reported that she had been dyspneic at rest and had had occasional twinges of chest pain.  The chest pain was nonexertional.  She also reported that she had had a sore throat and intermittent headaches.  The patient also reported that she had had dysphagia to liquids and solids for 2 months.  She admitted to nausea but no vomiting or diarrhea.  In the ED upon initial evaluation she was found to have adenopathy in  her left cervical area and a CT scan of the neck was done and it showed a supraglottic mass.  The hospitalist service was asked to admit the patient for further evaluation and management.  HOSPITAL COURSE: 1. Squamous cell carcinoma of the supraglottic larynx - As discussed     above, the patient had a CT scan of the neck which showed a     supraglottic mass and Dr. [name] with ENT was consulted to see the     patient.  He requested that the patient be transferred from [facility] to [facility] for further biopsies and operative management.     Following [facility] the [facility] was     called on July 08, 2010 at 3:00 a.m. indicating that the patient     was in respiratory distress and had dropped her O2 sats and     required to be placed on 6 liters of nasal cannula oxygen.  An ABG     was obtained and rapid response was called.   The ABG revealed a pH     of 7.04 with a pCO2 of 136, O2 of 98, and a bicarb 35.5.  The     patient was placed on BiPAP and transferred to the ICU.  The     critical care team was consulted and on arrival to the room     Anesthesia was also present with a fiberoptic scope and a     GlideScope.  Oral anesthesia was attempted with topical lidocaine     to facilitate a fiberoptic look for intubation.  The patient was     unable to corporate due to altered mental status and so the ENT was     notified of the situation.  The decision was made to take the     patient to the OR for emergent awake tracheostomy and this was done     per Dr. [name].  The patient was followed and managed by ENT and     CCM. and on July 09, 2010, she was taken off the vent and placed     on the trach collar which she tolerated well for greater than 24     hours.  Following that, Critical Care recommended to continue the     trach collar and transfer the patient to a floor bed and back to     the hospitalist service and this was done.  General Surgery was     consulted by the ENT for a gastrostomy tube and they saw the     patient on July 11, 2010 and she was taken for surgery on     July 12, 2010 and she tolerated the procedure well.  General     Surgery followed and started her on tube feedings on July 13, 2010 and she has been tolerating her tube feedings well.  Dr. [name]     discussed laryngectomy with the patient and family and following     this he consulted Radiation Oncology.  They saw the patient and     indicated that trimodality treatment will be appropriate for the     patient and stated that they will make final decision on postop     chemo/radiation therapy pending the pathology and that the patient     needed to be seen by Medical Oncology.  Medical Oncology was     consulted as the  pathology came back positive for squamous cell     cancer and Dr. Gaylyn Rong saw the patient on July 15, 2010  and following     his evaluation and discussion with family, he indicated that he     will see the patient again in the clinic to discuss the risks and     benefits of adjuvant chemotherapy if she needed that at all.  He     stated that given the patient's CT for staging that upfront     chemoradiation therapy may not be very effective.  He stated that     she most likely had an incompetent larynx given the massive tumor     and may not have a very competent voice box after definitive     chemoradiation therapy.  Therefore he agreed with the patient's     decision along with Dr. [name] to proceed with laryngectomy and     bilateral cervical lymph node dissection.  He stated that the     chance of recurrence was very high with resection alone - greater     than 50%, so he recommended adjuvant chemoradiation therapy.     Please see Dr. Lodema Pilot consult note for the rest of the details of his     consult.  He recommended that the patient improve her performance     status by ambulating around with physical therapy in addition to     her tube feedings.  Dr. [name] is to follow up with the patient to     indicate the finalized date for her surgery and also to see if the     patient can be discharged prior to the surgery.  The patient was complaining of increased pain today on rounds and her oral     narcotics were increased and to be administered through her feeding     tube and rounding hospitalist is to follow for improvement and     further manage as appropriate. 2. Dysphagia - As discussed above, the patient is status post open     gastrostomy tube per [facility] on July 12, 2010.  Her     tube feedings were started on January 28 and she has been     tolerating them well.  Dr. Gerrit Friends followed up with the patient     today and has recommended that if the patient is still in the     hospital by the end of the week staples can be taken out and if not     she is to follow up in the office  [facility]) in 7-10 days. 3. Hypertension - The patient was restarted on her lisinopril and this     was the increased to b.i.d. dosing, rounding hospitalist to follow     and adjust dose as appropriate for optimal control. 4. History of asthma - She was maintained on her bronchodilators     during this hospital stay. 5. Sick euthyroid syndrome - While in the ICU the patient had thyroid     function test and this was consistent with sick euthyroid syndrome.     She is to follow up outpatient for repeat thyroid function tests in     4-6 weeks and further management as appropriate. History of tobacco abuse - As noted above, the patient reported that she had recently quit smoking tobacco.  The rounding hospitalist is to dictate the rest of her hospital stay, a physical therapy evaluation is also  pending at this time to assist with her disposition.  The discharging hospitalist to dictate final discharge medications as well.     Kela Millin, M.D.     ACV/MEDQ  D:  07/16/2010  T:  07/16/2010  Job:  [id number]  Electronically Signed by Donnalee Curry M.D. on 09/19/2010 08:11:54 AM

## 2010-09-23 ENCOUNTER — Other Ambulatory Visit: Payer: Self-pay | Admitting: Oncology

## 2010-09-23 ENCOUNTER — Other Ambulatory Visit (HOSPITAL_COMMUNITY): Payer: Self-pay | Admitting: Dentistry

## 2010-09-23 ENCOUNTER — Encounter (HOSPITAL_BASED_OUTPATIENT_CLINIC_OR_DEPARTMENT_OTHER): Payer: Self-pay | Admitting: Oncology

## 2010-09-23 DIAGNOSIS — K117 Disturbances of salivary secretion: Secondary | ICD-10-CM

## 2010-09-23 DIAGNOSIS — K1231 Oral mucositis (ulcerative) due to antineoplastic therapy: Secondary | ICD-10-CM

## 2010-09-23 DIAGNOSIS — K1230 Oral mucositis (ulcerative), unspecified: Secondary | ICD-10-CM

## 2010-09-23 DIAGNOSIS — Z87891 Personal history of nicotine dependence: Secondary | ICD-10-CM

## 2010-09-23 DIAGNOSIS — C321 Malignant neoplasm of supraglottis: Secondary | ICD-10-CM

## 2010-09-23 DIAGNOSIS — D649 Anemia, unspecified: Secondary | ICD-10-CM

## 2010-09-23 DIAGNOSIS — K08109 Complete loss of teeth, unspecified cause, unspecified class: Secondary | ICD-10-CM

## 2010-09-23 DIAGNOSIS — K121 Other forms of stomatitis: Secondary | ICD-10-CM

## 2010-09-23 LAB — COMPREHENSIVE METABOLIC PANEL
ALT: 9 U/L (ref 0–35)
AST: 16 U/L (ref 0–37)
Albumin: 4.3 g/dL (ref 3.5–5.2)
Alkaline Phosphatase: 62 U/L (ref 39–117)
Potassium: 4.7 mEq/L (ref 3.5–5.3)
Sodium: 136 mEq/L (ref 135–145)
Total Protein: 7.6 g/dL (ref 6.0–8.3)

## 2010-09-23 LAB — CBC WITH DIFFERENTIAL/PLATELET
EOS%: 3.3 % (ref 0.0–7.0)
Eosinophils Absolute: 0.1 10*3/uL (ref 0.0–0.5)
MCH: 28.4 pg (ref 25.1–34.0)
MCV: 85.8 fL (ref 79.5–101.0)
MONO%: 12.7 % (ref 0.0–14.0)
NEUT#: 1.9 10*3/uL (ref 1.5–6.5)
RBC: 3.88 10*6/uL (ref 3.70–5.45)
RDW: 13.7 % (ref 11.2–14.5)
lymph#: 0.6 10*3/uL — ABNORMAL LOW (ref 0.9–3.3)

## 2010-09-24 ENCOUNTER — Encounter (HOSPITAL_BASED_OUTPATIENT_CLINIC_OR_DEPARTMENT_OTHER): Payer: Medicaid Other | Admitting: Oncology

## 2010-09-24 DIAGNOSIS — Z5111 Encounter for antineoplastic chemotherapy: Secondary | ICD-10-CM

## 2010-09-24 DIAGNOSIS — C321 Malignant neoplasm of supraglottis: Secondary | ICD-10-CM

## 2010-09-30 ENCOUNTER — Other Ambulatory Visit: Payer: Self-pay | Admitting: Oncology

## 2010-09-30 ENCOUNTER — Encounter (HOSPITAL_BASED_OUTPATIENT_CLINIC_OR_DEPARTMENT_OTHER): Payer: Self-pay | Admitting: Oncology

## 2010-09-30 DIAGNOSIS — D649 Anemia, unspecified: Secondary | ICD-10-CM

## 2010-09-30 DIAGNOSIS — C321 Malignant neoplasm of supraglottis: Secondary | ICD-10-CM

## 2010-09-30 DIAGNOSIS — R112 Nausea with vomiting, unspecified: Secondary | ICD-10-CM

## 2010-09-30 LAB — CBC WITH DIFFERENTIAL/PLATELET
BASO%: 1.3 % (ref 0.0–2.0)
Eosinophils Absolute: 0.1 10*3/uL (ref 0.0–0.5)
LYMPH%: 20.9 % (ref 14.0–49.7)
MCH: 28.4 pg (ref 25.1–34.0)
MCHC: 33.3 g/dL (ref 31.5–36.0)
MCV: 85.2 fL (ref 79.5–101.0)
MONO%: 13.8 % (ref 0.0–14.0)
Platelets: 208 10*3/uL (ref 145–400)
RBC: 3.91 10*6/uL (ref 3.70–5.45)

## 2010-09-30 LAB — COMPREHENSIVE METABOLIC PANEL
Alkaline Phosphatase: 60 U/L (ref 39–117)
Glucose, Bld: 93 mg/dL (ref 70–99)
Sodium: 136 mEq/L (ref 135–145)
Total Bilirubin: 0.4 mg/dL (ref 0.3–1.2)
Total Protein: 7.7 g/dL (ref 6.0–8.3)

## 2010-10-01 ENCOUNTER — Encounter (HOSPITAL_BASED_OUTPATIENT_CLINIC_OR_DEPARTMENT_OTHER): Payer: Medicaid Other | Admitting: Oncology

## 2010-10-01 DIAGNOSIS — F17200 Nicotine dependence, unspecified, uncomplicated: Secondary | ICD-10-CM

## 2010-10-01 DIAGNOSIS — J449 Chronic obstructive pulmonary disease, unspecified: Secondary | ICD-10-CM

## 2010-10-01 DIAGNOSIS — K1230 Oral mucositis (ulcerative), unspecified: Secondary | ICD-10-CM

## 2010-10-01 DIAGNOSIS — Z5111 Encounter for antineoplastic chemotherapy: Secondary | ICD-10-CM

## 2010-10-01 DIAGNOSIS — D72819 Decreased white blood cell count, unspecified: Secondary | ICD-10-CM

## 2010-10-01 DIAGNOSIS — C321 Malignant neoplasm of supraglottis: Secondary | ICD-10-CM

## 2010-10-01 DIAGNOSIS — D649 Anemia, unspecified: Secondary | ICD-10-CM

## 2010-10-01 DIAGNOSIS — K121 Other forms of stomatitis: Secondary | ICD-10-CM

## 2010-10-05 ENCOUNTER — Emergency Department (HOSPITAL_COMMUNITY): Payer: Medicaid Other

## 2010-10-05 ENCOUNTER — Inpatient Hospital Stay (HOSPITAL_COMMUNITY)
Admission: EM | Admit: 2010-10-05 | Discharge: 2010-10-10 | DRG: 206 | Disposition: A | Discharge status: Home Health | Payer: Medicaid Other | Attending: Family Medicine | Admitting: Family Medicine

## 2010-10-05 DIAGNOSIS — J45909 Unspecified asthma, uncomplicated: Secondary | ICD-10-CM | POA: Diagnosis present

## 2010-10-05 DIAGNOSIS — R079 Chest pain, unspecified: Secondary | ICD-10-CM | POA: Diagnosis present

## 2010-10-05 DIAGNOSIS — Y842 Radiological procedure and radiotherapy as the cause of abnormal reaction of the patient, or of later complication, without mention of misadventure at the time of the procedure: Secondary | ICD-10-CM | POA: Diagnosis present

## 2010-10-05 DIAGNOSIS — Z931 Gastrostomy status: Secondary | ICD-10-CM

## 2010-10-05 DIAGNOSIS — Z9089 Acquired absence of other organs: Secondary | ICD-10-CM

## 2010-10-05 DIAGNOSIS — I10 Essential (primary) hypertension: Secondary | ICD-10-CM | POA: Diagnosis present

## 2010-10-05 DIAGNOSIS — C329 Malignant neoplasm of larynx, unspecified: Secondary | ICD-10-CM | POA: Diagnosis present

## 2010-10-05 DIAGNOSIS — D72819 Decreased white blood cell count, unspecified: Secondary | ICD-10-CM | POA: Diagnosis present

## 2010-10-05 DIAGNOSIS — Z923 Personal history of irradiation: Secondary | ICD-10-CM

## 2010-10-05 DIAGNOSIS — J9509 Other tracheostomy complication: Principal | ICD-10-CM | POA: Diagnosis present

## 2010-10-05 LAB — COMPREHENSIVE METABOLIC PANEL
Alkaline Phosphatase: 60 U/L (ref 39–117)
BUN: 17 mg/dL (ref 6–23)
CO2: 27 mEq/L (ref 19–32)
Chloride: 101 mEq/L (ref 96–112)
GFR calc non Af Amer: >60 mL/min (ref >60)
Glucose, Bld: 101 mg/dL — ABNORMAL HIGH (ref 70–99)
Potassium: 3.8 mEq/L (ref 3.5–5.1)
Total Bilirubin: 0.7 mg/dL (ref 0.3–1.2)

## 2010-10-05 LAB — CBC
HCT: 30.4 % — ABNORMAL LOW (ref 36.0–46.0)
Hemoglobin: 10.5 g/dL — ABNORMAL LOW (ref 12.0–15.0)
MCV: 84 fL (ref 78.0–100.0)
RBC: 3.62 MIL/uL — ABNORMAL LOW (ref 3.87–5.11)
RDW: 13.6 % (ref 11.5–15.5)
WBC: 1.6 10*3/uL — ABNORMAL LOW (ref 4.0–10.5)

## 2010-10-05 LAB — DIFFERENTIAL
Basophils Relative: 1 % (ref 0–1)
Eosinophils Relative: 5 % (ref 0–5)
Monocytes Absolute: 0.2 10*3/uL (ref 0.1–1.0)
Neutrophils Relative %: 53 % (ref 43–77)

## 2010-10-05 LAB — PROTIME-INR
INR: 1.01 (ref 0.00–1.49)
Prothrombin Time: 13.5 seconds (ref 11.6–15.2)

## 2010-10-07 LAB — BASIC METABOLIC PANEL
BUN: 10 mg/dL (ref 6–23)
CO2: 29 mEq/L (ref 19–32)
Calcium: 9.2 mg/dL (ref 8.4–10.5)
GFR calc non Af Amer: >60 mL/min (ref >60)
Glucose, Bld: 81 mg/dL (ref 70–99)

## 2010-10-07 LAB — CBC
HCT: 28.8 % — ABNORMAL LOW (ref 36.0–46.0)
MCH: 28 pg (ref 26.0–34.0)
MCHC: 32.6 g/dL (ref 30.0–36.0)
RDW: 13.7 % (ref 11.5–15.5)

## 2010-10-07 LAB — CARDIAC PANEL(CRET KIN+CKTOT+MB+TROPI)
CK, MB: 0.4 ng/mL (ref 0.3–4.0)
CK, MB: 0.5 ng/mL (ref 0.3–4.0)
Relative Index: INVALID (ref 0.0–2.5)
Total CK: 20 U/L (ref 7–177)
Troponin I: 0.01 ng/mL (ref 0.00–0.06)
Troponin I: <0.01 ng/mL (ref 0.00–0.06)
Troponin I: 0.01 ng/mL (ref 0.00–0.06)

## 2010-10-08 LAB — CBC
HCT: 29.8 % — ABNORMAL LOW (ref 36.0–46.0)
Hemoglobin: 9.9 g/dL — ABNORMAL LOW (ref 12.0–15.0)
MCH: 28.5 pg (ref 26.0–34.0)
MCHC: 33.2 g/dL (ref 30.0–36.0)
MCV: 85.9 fL (ref 78.0–100.0)
Platelets: 165 10*3/uL (ref 150–400)
RBC: 3.47 MIL/uL — ABNORMAL LOW (ref 3.87–5.11)
RDW: 13.7 % (ref 11.5–15.5)
WBC: 1.4 10*3/uL — CL (ref 4.0–10.5)

## 2010-10-09 LAB — CBC
MCH: 28.4 pg (ref 26.0–34.0)
MCHC: 33 g/dL (ref 30.0–36.0)
MCV: 86.1 fL (ref 78.0–100.0)
Platelets: 146 10*3/uL — ABNORMAL LOW (ref 150–400)
RBC: 3.31 MIL/uL — ABNORMAL LOW (ref 3.87–5.11)
RDW: 13.6 % (ref 11.5–15.5)

## 2010-10-09 LAB — BASIC METABOLIC PANEL
BUN: 11 mg/dL (ref 6–23)
Calcium: 8.8 mg/dL (ref 8.4–10.5)
Chloride: 105 mEq/L (ref 96–112)
Creatinine, Ser: 0.73 mg/dL (ref 0.4–1.2)
GFR calc Af Amer: >60 mL/min (ref >60)
GFR calc non Af Amer: >60 mL/min (ref >60)

## 2010-10-10 ENCOUNTER — Emergency Department (HOSPITAL_COMMUNITY)
Admission: EM | Admit: 2010-10-10 | Discharge: 2010-10-10 | Disposition: A | Discharge status: Home / Self Care | Payer: Medicaid Other | Attending: Emergency Medicine | Admitting: Emergency Medicine

## 2010-10-10 DIAGNOSIS — Z931 Gastrostomy status: Secondary | ICD-10-CM | POA: Insufficient documentation

## 2010-10-10 DIAGNOSIS — R112 Nausea with vomiting, unspecified: Secondary | ICD-10-CM | POA: Insufficient documentation

## 2010-10-10 DIAGNOSIS — Z8521 Personal history of malignant neoplasm of larynx: Secondary | ICD-10-CM | POA: Insufficient documentation

## 2010-10-10 DIAGNOSIS — Z930 Tracheostomy status: Secondary | ICD-10-CM | POA: Insufficient documentation

## 2010-10-10 DIAGNOSIS — I10 Essential (primary) hypertension: Secondary | ICD-10-CM | POA: Insufficient documentation

## 2010-10-10 LAB — DIFFERENTIAL
Blasts: 0 %
Eosinophils Absolute: 0 10*3/uL (ref 0.0–0.7)
Metamyelocytes Relative: 4 %
Myelocytes: 0 %
Neutro Abs: 0.9 10*3/uL — ABNORMAL LOW (ref 1.7–7.7)
Neutrophils Relative %: 72 % (ref 43–77)
Promyelocytes Absolute: 1 %
nRBC: 0 /100 WBC

## 2010-10-10 LAB — CBC
MCH: 28.7 pg (ref 26.0–34.0)
MCHC: 33.6 g/dL (ref 30.0–36.0)
Platelets: 135 10*3/uL — ABNORMAL LOW (ref 150–400)
RDW: 13.6 % (ref 11.5–15.5)

## 2010-10-14 ENCOUNTER — Other Ambulatory Visit: Payer: Self-pay | Admitting: Oncology

## 2010-10-14 ENCOUNTER — Encounter (HOSPITAL_BASED_OUTPATIENT_CLINIC_OR_DEPARTMENT_OTHER): Payer: Medicaid Other | Admitting: Oncology

## 2010-10-14 DIAGNOSIS — D72819 Decreased white blood cell count, unspecified: Secondary | ICD-10-CM

## 2010-10-14 DIAGNOSIS — D649 Anemia, unspecified: Secondary | ICD-10-CM

## 2010-10-14 DIAGNOSIS — C321 Malignant neoplasm of supraglottis: Secondary | ICD-10-CM

## 2010-10-14 LAB — CBC WITH DIFFERENTIAL/PLATELET
BASO%: 0.4 % (ref 0.0–2.0)
LYMPH%: 23.8 % (ref 14.0–49.7)
MCHC: 34.6 g/dL (ref 31.5–36.0)
MONO#: 0.1 10*3/uL (ref 0.1–0.9)
Platelets: 159 10*3/uL (ref 145–400)
RBC: 3.09 10*6/uL — ABNORMAL LOW (ref 3.70–5.45)
RDW: 15.1 % — ABNORMAL HIGH (ref 11.2–14.5)
WBC: 1.3 10*3/uL — ABNORMAL LOW (ref 3.9–10.3)
lymph#: 0.3 10*3/uL — ABNORMAL LOW (ref 0.9–3.3)

## 2010-10-14 LAB — COMPREHENSIVE METABOLIC PANEL
ALT: <8 U/L (ref 0–35)
CO2: 26 mEq/L (ref 19–32)
Potassium: 3.9 mEq/L (ref 3.5–5.3)
Sodium: 138 mEq/L (ref 135–145)
Total Bilirubin: 0.4 mg/dL (ref 0.3–1.2)
Total Protein: 7.4 g/dL (ref 6.0–8.3)

## 2010-10-14 LAB — MAGNESIUM: Magnesium: 1.9 mg/dL (ref 1.5–2.5)

## 2010-10-15 ENCOUNTER — Other Ambulatory Visit: Payer: Self-pay | Admitting: Oncology

## 2010-10-15 ENCOUNTER — Encounter (HOSPITAL_BASED_OUTPATIENT_CLINIC_OR_DEPARTMENT_OTHER): Payer: Medicaid Other | Admitting: Oncology

## 2010-10-15 DIAGNOSIS — C140 Malignant neoplasm of pharynx, unspecified: Secondary | ICD-10-CM

## 2010-10-15 DIAGNOSIS — E441 Mild protein-calorie malnutrition: Secondary | ICD-10-CM

## 2010-10-15 DIAGNOSIS — C321 Malignant neoplasm of supraglottis: Secondary | ICD-10-CM

## 2010-10-15 DIAGNOSIS — R112 Nausea with vomiting, unspecified: Secondary | ICD-10-CM

## 2010-10-23 NOTE — Discharge Summary (Signed)
NAME:  Deb[place]ah [name], [name]            ACCOUNT NO.:  [id number]  MEDICAL RECORD NO.:  [id number]           PATIENT TYPE:  I  LOCATION:  1415                         FACILITY:  [facility]  PHYSICIAN:  Mauro Kaufmann, MD         DATE OF BIRTH:  [birth year]  DATE OF ADMISSION:  10/05/2010 DATE OF DISCHARGE:  10/10/2010                              DISCHARGE SUMMARY   ADMISSION DIAGNOSES: 1. Intermittent stromal obstruction. 2. Hypertension. 3. Leukopenia.  DISCHARGE DIAGNOSES: 1. 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 2. Leukopenia, no filgrastim at this time as per Dr. Gaylyn Rong. 3. History of asthma, stable. 4. Nutrition via PEG tube feeding. 5. Hypertension.  TESTS PERFORMED DURING THE HOSPITAL STAY:  Include chest x-ray on April 1 showed resolved bibasilar airspace with minimal atelectasis.  No acute cardiopulmonary process.  PERTINENT LABS:  Pertinent labs showed cardiac enzymes x3 negative.  On the day of discharge, WBC 4.1 with neutrophils of 72%.  Sodium 142, potassium 4.0, chloride 105, CO2 of 31, BUN 11, creatinine 0.73.  BRIEF HISTORY AND PHYSICAL:  This is a 55 year old female with a history of tobacco and alcohol use who has laryngeal squamous cell carcinoma, status post laryngectomy and radical neck dissection, who has been getting radiation treatment.  The patient came to the hospital with intermittent stromal obstruction.  ENT was consulted.  BRIEF HOSPITAL COURSE: 1. Intermittent stromal obstruction.  The patient was seen by the ENT     and ENT recommended trach collar with humidifier oxygen along with     bacitracin ointment and Augmentin.  The stromal bleeding is     secondary to the patient treatment and the patient would just need     conservative management for this.  At this time, home health has     been set up and the patient will be provided respiratory therapy at     home after  discharge. 2. Chest pain.  The patient had chest pain during admission, which is     atypical at this time.  Cardiac enzymes x3 were negative. 3. History of asthma.  The patient is stable at this time. 4. Hypertension.  The patient was on metoprolol and will be continued     on that after discharge. 5. Tube feeding via the PEG tube.  The patient has been getting PEG     tube feeding and her Jevity has been increased.  Initially, it was     increased to 447 mL four times a day, but the patient could not     tolerate it and had episodes of nausea and vomiting.  At this time,     she has only taken three times a day, so I am going to change the     Jevity 447 mL to three times a day via the PEG tube.     Patient has not had vomiting or nausea today, so I will discharge her home      on reduced frequency of jevity. 6. Social situation.  The patient will not be able to return  to the     boyfriend because of the social situation.  At this time, the     patient's sister is going to take the patient home, and home health     has been set up to provide the home care at home, will provide the     physical therapy, respiratory therapy, and nursing at home.  The     patient should continue to use the Augmentin for 10 more days as     per recommendation by the ENT, and also use the bacitracin for 10     more days.  The patient will follow up with [facility] and Dr. Gaylyn Rong     and Dr. Juliene Pina as outpatient.  The patient is currently receiving the     radiation treatment every day, and she should continue doing that.  MEDICATIONS ON DISCHARGE: 1. Augmentin 875 via the tube twice a day for 10 days. 2. Bacitracin ointment 1 application topical twice a day. 3. Lactose-free Jevity 474 mL via the tube three times a day. 4. Albuterol inhaler 2 puffs inhaled every 4 hours as needed. 5. Biafine topical emulsion one application topical twice a day. 6. Combivent 2 puffs inhaled twice daily as needed. 7.  Diphenhydramine 25 mg 1 tablet p.o. very 6 hours as needed. 8. Fentanyl patch 25 mcg transdermal every 3 days. 9. Hydrocodone/acetaminophen elixir 7.5/500 one tablespoon by mouth     every 4 hours as needed. 10.Lisinopril 10 mg 1 tablet p.o. t.i.d. 11.Lorazepam 0.5 mg 1 tablet p.o. every 6 hours as needed. 12.Metoprolol tartrate 25 mg 1 tablet p.o. twice a day. 13.Oxycodone 5 mg 2-3 tablets by mouth every 4 hours as needed. 14.Protonix 40 mg 1 tablet p.o. via the tube daily. 15.Prochlorperazine 10 mg 1 tablet by mouth every 6 hours as needed. 16.Zofran ODT under the tongue 8 mg every 12 hours as needed. 17.Free water 120-140 mL 4 times a day as needed via the PEG tube.  On the day of discharge, the patient's blood pressure 134/85, O2 sats are 100% on 8% trach collar.     Mauro Kaufmann, MD     GL/MEDQ  D:  10/10/2010  T:  10/11/2010  Job:  [id number]  cc:   [name], M.D. Darryl Nestle, MD [facility] March, M.D.  Electronically Signed by Mauro Kaufmann  on 10/23/2010 04:25:39 PM

## 2010-10-25 ENCOUNTER — Ambulatory Visit: Payer: Medicaid Other | Attending: Radiation Oncology | Admitting: Radiation Oncology

## 2010-10-28 ENCOUNTER — Observation Stay (HOSPITAL_COMMUNITY)
Admission: RE | Admit: 2010-10-28 | Discharge: 2010-10-29 | Disposition: A | Discharge status: Home / Self Care | Payer: Medicaid Other | Source: Ambulatory Visit | Attending: Otolaryngology | Admitting: Otolaryngology

## 2010-10-28 DIAGNOSIS — J9503 Malfunction of tracheostomy stoma: Principal | ICD-10-CM | POA: Insufficient documentation

## 2010-10-28 DIAGNOSIS — Y849 Medical procedure, unspecified as the cause of abnormal reaction of the patient, or of later complication, without mention of misadventure at the time of the procedure: Secondary | ICD-10-CM | POA: Insufficient documentation

## 2010-10-28 DIAGNOSIS — Z923 Personal history of irradiation: Secondary | ICD-10-CM | POA: Insufficient documentation

## 2010-10-28 DIAGNOSIS — C329 Malignant neoplasm of larynx, unspecified: Secondary | ICD-10-CM | POA: Insufficient documentation

## 2010-10-28 LAB — BASIC METABOLIC PANEL
BUN: 10 mg/dL (ref 6–23)
Calcium: 9.9 mg/dL (ref 8.4–10.5)
GFR calc non Af Amer: >60 mL/min (ref >60)
Glucose, Bld: 93 mg/dL (ref 70–99)

## 2010-10-28 LAB — CBC
HCT: 28.6 % — ABNORMAL LOW (ref 36.0–46.0)
MCHC: 33.2 g/dL (ref 30.0–36.0)
MCV: 88 fL (ref 78.0–100.0)
RDW: 16.4 % — ABNORMAL HIGH (ref 11.5–15.5)

## 2010-11-08 NOTE — H&P (Signed)
NAME:  Deborah [name], MATZKE            ACCOUNT NO.:  [id number]  MEDICAL RECORD NO.:  [id number]           PATIENT TYPE:  E  LOCATION:  WLED                         FACILITY:  [facility]  PHYSICIAN:  Lamar Laundry, MD      DATE OF BIRTH:  [birth year]  DATE OF ADMISSION:  10/05/2010 DATE OF DISCHARGE:                             HISTORY & PHYSICAL   HEMATOLOGIST/ONCOLOGIST:  [name], M.D.  ENT:  [name]. [name], MD  RADIATION ONCOLOGY:  Radene Gunning, M.D., Ph.D.  CHIEF COMPLAINT:  Intermittent stoma obstruction.  HISTORY OF PRESENT ILLNESS:  This is a 55 year old female with a history of tobacco and alcohol abuse, who has laryngeal squamous cell carcinoma and is status post laryngectomy and radical neck dissection on August 01, 2010, by Dr. [name].  She presents to the ER today due to intermittent bloody secretions that come out of her stoma resulting in difficulty breathing that is episcotic and quite scary to the patient. Due to several episodes of this, in the ER, she is being admitted overnight for further care.  ALLERGIES:  No known drug allergies.  MEDICATIONS: 1. Oxycodone 5 mg p.o. q.4 h. p.r.n. 2. Pantoprazole suspension via G-tube daily. 3. Prochlorperazine 10 mg p.o. q.6 h. p.r.n. 4. Zofran 8 mg q.12 h. p.r.n. 5. Metoprolol 25 mg G-tube twice daily. 6. Lorazepam 0.5 mg G-tube q.6 h. p.r.n. 7. Lisinopril 10 mg t.i.d. G-tube. 8. Jevity formula via G-tube 237 twice daily. 9. Combivent 2 puffs twice daily as needed. 10.Fentanyl Patch 25 mcg every 3 days. 11.Benadryl 25 mg q.6 h. p.r.n. 12.Biafine topical emulsion twice daily. 13.Albuterol inhaler 2 puffs q.4 h. p.r.n. 14.Hydrocodone and Tylenol Elixir 7.5/500 q.4 h. p.r.n.  PAST MEDICAL HISTORY:  Significant for, 1. Squamous cell carcinoma of the larynx, status post laryngectomy and     radical neck dissection, now undergoing radiation and chemotherapy. 2. Asthma. 3. Hypertension. 4. Former tobacco and alcohol  abuse.  SOCIAL HISTORY:  She is divorced.  She lives with a friend in [name], former smoker, and former daily alcohol use.  No illicit drug use.  FAMILY HISTORY:  Mother died at 70 from a heart attack.  REVIEW OF SYSTEMS:  Review of 12 systems is negative with the exception of the complaints previously detailed in the HPI.  PHYSICAL EXAMINATION:  VITAL SIGNS:  Temperature 98.6, blood pressure 126/90, pulse 100 to 110, respirations 18, and 96% on room air. GENERAL:  This is an [place] middle-aged female in no acute distress, lying in bed. HEENT:  Normocephalic, atraumatic.  There is a stoma site present. CARDIOVASCULAR:  Regular rate and rhythm, S1 and S2.  No murmurs. LUNGS:  Clear to auscultation bilaterally.  No crackles, wheezes, or rhonchi. ABDOMEN:  Soft, no focal tenderness. EXTREMITIES:  No pretibial edema. NEUROLOGIC:  No focal neurological deficits.  LABORATORY DATA:  CBC 1.6, hemoglobin 10.5, and hematocrit 30.4.  Sodium 136, potassium 3.8, chloride 101, bicarbonate 27, BUN 17, creatinine 0.88, albumin 3.8.  LFTs within normal limits.  INR 1.  Chest x-ray does not reveal anything acute.  ASSESSMENT AND PLAN:  This is a 55 year old female, status post  laryngectomy for laryngeal cancer with intermittent stoma obstruction due to secretions. 1. With respect to these intermittent stoma obstructions, multiple     phone calls were made from the ER to her oncologist and ENT doctor.     Their opinion was that it is common to see some of these secretions     in a patient that has received radiation therapy.  ENT has been     formally consulted to evaluate the patient.  We will place the     patient in step-down unit for frequent suctioning and care for     her stoma.  She will receive humidified oxygen and hopes that this will     help liberalize and loosen the secretions.  She will receive saline     and bacitracin to her stoma area per suggestion from ENT and  ENT     will see her formally for further care. 2. Hypertension.  We will continue her lisinopril and her metoprolol. 3. Leukopenia, this is likely secondary to chemotherapy and radiation     therapy.  No further workup is required at the present time. 4. Code status.  Full code.     Lamar Laundry, MD     HR/MEDQ  D:  10/05/2010  T:  10/05/2010  Job:  [id number]  Electronically Signed by Lamar Laundry MD on 11/08/2010 04:17:49 PM

## 2010-11-13 ENCOUNTER — Ambulatory Visit: Payer: Medicaid Other | Attending: Oncology

## 2010-11-13 DIAGNOSIS — R1310 Dysphagia, unspecified: Secondary | ICD-10-CM | POA: Insufficient documentation

## 2010-11-13 DIAGNOSIS — IMO0001 Reserved for inherently not codable concepts without codable children: Secondary | ICD-10-CM | POA: Insufficient documentation

## 2010-11-13 DIAGNOSIS — R491 Aphonia: Secondary | ICD-10-CM | POA: Insufficient documentation

## 2010-11-13 NOTE — Op Note (Signed)
  NAME:  Deborah [name], Deborah [name]            ACCOUNT NO.:  [id number]  MEDICAL RECORD NO.:  [id number]           PATIENT TYPE:  O  LOCATION:  3704                         FACILITY:  [facility]  PHYSICIAN:  Mechille Varghese H. [name], MD     DATE OF BIRTH:  17-Nov-1955  DATE OF PROCEDURE:  10/28/2010 DATE OF DISCHARGE:                              OPERATIVE REPORT   PREOPERATIVE DIAGNOSIS:  Tracheal stomal stenosis.  POSTOPERATIVE DIAGNOSIS:  Tracheal stomal stenosis.  PROCEDURE:  Dilation of tracheal stoma and insertion of tracheostomy tube.  SURGEON:  Tamryn Popko H. [name], MD  ANESTHESIA:  Intravenous propofol sedation.  FINDINGS:  Stenotic stoma with dry crusting and some radiation skin burn around the cervical area.  No complications.  HISTORY:  This is a 54 year old lady who underwent total laryngectomy about 5-6 months ago and followed by postop chemoradiation because of the high-risk nature of the cancer and has developed some stomal stenosis during the radiation treatments.  She was admitted today for elective stomal dilation.  Risks, benefit, alternatives, complications of the procedure were explained to the patient who seemed to understand and agreed to surgery.  PROCEDURE:  The patient was taken to the operating room, placed on the operating table in supine position.  Following induction of intravenous propofol, drapes were placed around the stoma.  She was administered oxygen via the stoma with a face mask held loosely.  A Mahoney dilator #42 was used with a water-soluble lubricant to dilate the stoma.  A #6 uncuffed trach tube was then placed without difficulty and secured in place with Velcro straps.  She tolerated this well.  There was no bleeding.  She had good air movement following the insertion of the tube.  She was then awakened from the sedation, transferred to recovery in stable condition.     Kristalynn Coddington H. [name], MD     JHR/MEDQ  D:  10/28/2010  T:  10/29/2010  Job:   [id number]  Electronically Signed by Serena Colonel MD on 11/13/2010 07:47:08 AM

## 2010-11-15 ENCOUNTER — Other Ambulatory Visit: Payer: Self-pay | Admitting: Oncology

## 2010-11-15 ENCOUNTER — Encounter (HOSPITAL_BASED_OUTPATIENT_CLINIC_OR_DEPARTMENT_OTHER): Payer: Medicaid Other | Admitting: Oncology

## 2010-11-15 DIAGNOSIS — K1231 Oral mucositis (ulcerative) due to antineoplastic therapy: Secondary | ICD-10-CM

## 2010-11-15 DIAGNOSIS — I10 Essential (primary) hypertension: Secondary | ICD-10-CM

## 2010-11-15 DIAGNOSIS — Z5111 Encounter for antineoplastic chemotherapy: Secondary | ICD-10-CM

## 2010-11-15 DIAGNOSIS — E441 Mild protein-calorie malnutrition: Secondary | ICD-10-CM

## 2010-11-15 DIAGNOSIS — C321 Malignant neoplasm of supraglottis: Secondary | ICD-10-CM

## 2010-11-15 LAB — COMPREHENSIVE METABOLIC PANEL
ALT: <8 U/L (ref 0–35)
Alkaline Phosphatase: 59 U/L (ref 39–117)
Glucose, Bld: 69 mg/dL — ABNORMAL LOW (ref 70–99)
Sodium: 142 mEq/L (ref 135–145)
Total Bilirubin: 0.2 mg/dL — ABNORMAL LOW (ref 0.3–1.2)
Total Protein: 7.3 g/dL (ref 6.0–8.3)

## 2010-11-15 LAB — CBC WITH DIFFERENTIAL/PLATELET
BASO%: 1.5 % (ref 0.0–2.0)
LYMPH%: 22.2 % (ref 14.0–49.7)
MCH: 31.4 pg (ref 25.1–34.0)
MCHC: 34.4 g/dL (ref 31.5–36.0)
MCV: 91.5 fL (ref 79.5–101.0)
MONO%: 12.8 % (ref 0.0–14.0)
Platelets: 310 10*3/uL (ref 145–400)
RBC: 3.01 10*6/uL — ABNORMAL LOW (ref 3.70–5.45)

## 2010-11-18 ENCOUNTER — Emergency Department (HOSPITAL_COMMUNITY): Payer: Medicaid Other

## 2010-11-18 ENCOUNTER — Emergency Department (HOSPITAL_COMMUNITY)
Admission: EM | Admit: 2010-11-18 | Discharge: 2010-11-19 | Discharge status: Against Medical Advice | Payer: Medicaid Other | Attending: Emergency Medicine | Admitting: Emergency Medicine

## 2010-11-18 DIAGNOSIS — Z931 Gastrostomy status: Secondary | ICD-10-CM | POA: Insufficient documentation

## 2010-11-18 DIAGNOSIS — R109 Unspecified abdominal pain: Secondary | ICD-10-CM | POA: Insufficient documentation

## 2010-11-18 DIAGNOSIS — C329 Malignant neoplasm of larynx, unspecified: Secondary | ICD-10-CM | POA: Insufficient documentation

## 2010-11-18 DIAGNOSIS — R5381 Other malaise: Secondary | ICD-10-CM | POA: Insufficient documentation

## 2010-11-18 DIAGNOSIS — Z930 Tracheostomy status: Secondary | ICD-10-CM | POA: Insufficient documentation

## 2010-11-18 DIAGNOSIS — R5383 Other fatigue: Secondary | ICD-10-CM | POA: Insufficient documentation

## 2010-11-18 DIAGNOSIS — R55 Syncope and collapse: Secondary | ICD-10-CM | POA: Insufficient documentation

## 2010-11-18 DIAGNOSIS — R300 Dysuria: Secondary | ICD-10-CM | POA: Insufficient documentation

## 2010-11-18 DIAGNOSIS — E860 Dehydration: Secondary | ICD-10-CM | POA: Insufficient documentation

## 2010-11-18 DIAGNOSIS — R112 Nausea with vomiting, unspecified: Secondary | ICD-10-CM | POA: Insufficient documentation

## 2010-11-18 LAB — URINALYSIS, ROUTINE W REFLEX MICROSCOPIC
Nitrite: NEGATIVE
Protein, ur: 30 mg/dL — AB
Specific Gravity, Urine: 1.027 (ref 1.005–1.030)
Urobilinogen, UA: 0.2 mg/dL (ref 0.0–1.0)

## 2010-11-18 LAB — DIFFERENTIAL
Eosinophils Absolute: 0.1 10*3/uL (ref 0.0–0.7)
Lymphs Abs: 0.6 10*3/uL — ABNORMAL LOW (ref 0.7–4.0)
Monocytes Relative: 12 % (ref 3–12)
Neutrophils Relative %: 78 % — ABNORMAL HIGH (ref 43–77)

## 2010-11-18 LAB — COMPREHENSIVE METABOLIC PANEL
Albumin: 4.8 g/dL (ref 3.5–5.2)
BUN: 29 mg/dL — ABNORMAL HIGH (ref 6–23)
CO2: 38 mEq/L — ABNORMAL HIGH (ref 19–32)
Chloride: 86 mEq/L — ABNORMAL LOW (ref 96–112)
Creatinine, Ser: 2.12 mg/dL — ABNORMAL HIGH (ref 0.4–1.2)
GFR calc non Af Amer: 24 mL/min — ABNORMAL LOW (ref >60)
Glucose, Bld: 106 mg/dL — ABNORMAL HIGH (ref 70–99)
Total Bilirubin: 0.3 mg/dL (ref 0.3–1.2)

## 2010-11-18 LAB — CBC
HCT: 35.7 % — ABNORMAL LOW (ref 36.0–46.0)
MCH: 29.8 pg (ref 26.0–34.0)
MCV: 90.8 fL (ref 78.0–100.0)
Platelets: 298 10*3/uL (ref 150–400)
RBC: 3.93 MIL/uL (ref 3.87–5.11)

## 2010-11-18 LAB — URINE MICROSCOPIC-ADD ON

## 2010-11-19 ENCOUNTER — Inpatient Hospital Stay (HOSPITAL_COMMUNITY)
Admission: EM | Admit: 2010-11-19 | Discharge: 2010-11-23 | DRG: 394 | Disposition: A | Discharge status: Home / Self Care | Payer: Medicaid Other | Attending: Internal Medicine | Admitting: Internal Medicine

## 2010-11-19 ENCOUNTER — Other Ambulatory Visit: Payer: Self-pay | Admitting: Oncology

## 2010-11-19 ENCOUNTER — Other Ambulatory Visit (HOSPITAL_COMMUNITY): Payer: Medicaid - Dental | Admitting: Dentistry

## 2010-11-19 ENCOUNTER — Encounter (HOSPITAL_BASED_OUTPATIENT_CLINIC_OR_DEPARTMENT_OTHER): Payer: Medicaid Other | Admitting: Oncology

## 2010-11-19 ENCOUNTER — Observation Stay (HOSPITAL_COMMUNITY): Payer: Medicaid Other

## 2010-11-19 DIAGNOSIS — Z930 Tracheostomy status: Secondary | ICD-10-CM

## 2010-11-19 DIAGNOSIS — C321 Malignant neoplasm of supraglottis: Secondary | ICD-10-CM

## 2010-11-19 DIAGNOSIS — R112 Nausea with vomiting, unspecified: Secondary | ICD-10-CM | POA: Diagnosis present

## 2010-11-19 DIAGNOSIS — D6489 Other specified anemias: Secondary | ICD-10-CM | POA: Diagnosis present

## 2010-11-19 DIAGNOSIS — E871 Hypo-osmolality and hyponatremia: Secondary | ICD-10-CM | POA: Diagnosis present

## 2010-11-19 DIAGNOSIS — Z923 Personal history of irradiation: Secondary | ICD-10-CM

## 2010-11-19 DIAGNOSIS — N179 Acute kidney failure, unspecified: Secondary | ICD-10-CM | POA: Diagnosis present

## 2010-11-19 DIAGNOSIS — I951 Orthostatic hypotension: Secondary | ICD-10-CM | POA: Diagnosis present

## 2010-11-19 DIAGNOSIS — C329 Malignant neoplasm of larynx, unspecified: Secondary | ICD-10-CM | POA: Diagnosis present

## 2010-11-19 DIAGNOSIS — K117 Disturbances of salivary secretion: Secondary | ICD-10-CM

## 2010-11-19 DIAGNOSIS — R197 Diarrhea, unspecified: Secondary | ICD-10-CM | POA: Diagnosis present

## 2010-11-19 DIAGNOSIS — Z87891 Personal history of nicotine dependence: Secondary | ICD-10-CM

## 2010-11-19 DIAGNOSIS — K08109 Complete loss of teeth, unspecified cause, unspecified class: Secondary | ICD-10-CM

## 2010-11-19 DIAGNOSIS — Z9089 Acquired absence of other organs: Secondary | ICD-10-CM

## 2010-11-19 DIAGNOSIS — E441 Mild protein-calorie malnutrition: Secondary | ICD-10-CM

## 2010-11-19 DIAGNOSIS — Z431 Encounter for attention to gastrostomy: Principal | ICD-10-CM

## 2010-11-19 DIAGNOSIS — J45909 Unspecified asthma, uncomplicated: Secondary | ICD-10-CM | POA: Diagnosis present

## 2010-11-19 DIAGNOSIS — R439 Unspecified disturbances of smell and taste: Secondary | ICD-10-CM

## 2010-11-19 DIAGNOSIS — E039 Hypothyroidism, unspecified: Secondary | ICD-10-CM | POA: Diagnosis present

## 2010-11-19 DIAGNOSIS — I10 Essential (primary) hypertension: Secondary | ICD-10-CM | POA: Diagnosis present

## 2010-11-19 DIAGNOSIS — Z9221 Personal history of antineoplastic chemotherapy: Secondary | ICD-10-CM

## 2010-11-19 LAB — URINE MICROSCOPIC-ADD ON

## 2010-11-19 LAB — CBC
HCT: 34.1 % — ABNORMAL LOW (ref 36.0–46.0)
MCV: 90.7 fL (ref 78.0–100.0)
Platelets: 316 10*3/uL (ref 150–400)
RBC: 3.76 MIL/uL — ABNORMAL LOW (ref 3.87–5.11)
RDW: 15.8 % — ABNORMAL HIGH (ref 11.5–15.5)
WBC: 4.3 10*3/uL (ref 4.0–10.5)

## 2010-11-19 LAB — DIFFERENTIAL
Basophils Absolute: 0 10*3/uL (ref 0.0–0.1)
Basophils Relative: 1 % (ref 0–1)
Eosinophils Absolute: 0.1 10*3/uL (ref 0.0–0.7)
Eosinophils Relative: 2 % (ref 0–5)
Monocytes Absolute: 0.5 10*3/uL (ref 0.1–1.0)

## 2010-11-19 LAB — BASIC METABOLIC PANEL
BUN: 28 mg/dL — ABNORMAL HIGH (ref 6–23)
Chloride: 86 mEq/L — ABNORMAL LOW (ref 96–112)
GFR calc non Af Amer: 30 mL/min — ABNORMAL LOW (ref >60)
Glucose, Bld: 102 mg/dL — ABNORMAL HIGH (ref 70–99)
Potassium: 3.2 mEq/L — ABNORMAL LOW (ref 3.5–5.1)
Sodium: 134 mEq/L — ABNORMAL LOW (ref 135–145)

## 2010-11-19 LAB — URINALYSIS, ROUTINE W REFLEX MICROSCOPIC
Glucose, UA: NEGATIVE mg/dL
Ketones, ur: NEGATIVE mg/dL
Nitrite: NEGATIVE
Specific Gravity, Urine: 1.024 (ref 1.005–1.030)
pH: 5 (ref 5.0–8.0)

## 2010-11-19 LAB — CBC WITH DIFFERENTIAL/PLATELET
BASO%: 0.3 % (ref 0.0–2.0)
Basophils Absolute: 0 10*3/uL (ref 0.0–0.1)
EOS%: 1.6 % (ref 0.0–7.0)
HCT: 32.2 % — ABNORMAL LOW (ref 34.8–46.6)
HGB: 10.9 g/dL — ABNORMAL LOW (ref 11.6–15.9)
LYMPH%: 10.2 % — ABNORMAL LOW (ref 14.0–49.7)
MCH: 31.1 pg (ref 25.1–34.0)
MCHC: 33.7 g/dL (ref 31.5–36.0)
MONO#: 0.5 10*3/uL (ref 0.1–0.9)
NEUT%: 76.5 % (ref 38.4–76.8)
Platelets: 345 10*3/uL (ref 145–400)

## 2010-11-19 LAB — COMPREHENSIVE METABOLIC PANEL
ALT: 41 U/L — ABNORMAL HIGH (ref 0–35)
BUN: 27 mg/dL — ABNORMAL HIGH (ref 6–23)
CO2: 37 mEq/L — ABNORMAL HIGH (ref 19–32)
Calcium: 9.4 mg/dL (ref 8.4–10.5)
Chloride: 89 mEq/L — ABNORMAL LOW (ref 96–112)
Creatinine, Ser: 1.8 mg/dL — ABNORMAL HIGH (ref 0.50–1.10)
Total Bilirubin: 0.4 mg/dL (ref 0.3–1.2)

## 2010-11-20 ENCOUNTER — Encounter (HOSPITAL_COMMUNITY): Payer: Self-pay

## 2010-11-20 ENCOUNTER — Observation Stay (HOSPITAL_COMMUNITY): Payer: Medicaid Other

## 2010-11-20 DIAGNOSIS — I359 Nonrheumatic aortic valve disorder, unspecified: Secondary | ICD-10-CM

## 2010-11-20 LAB — DIFFERENTIAL
Basophils Absolute: 0 10*3/uL (ref 0.0–0.1)
Basophils Relative: 1 % (ref 0–1)
Eosinophils Absolute: 0.1 10*3/uL (ref 0.0–0.7)
Lymphocytes Relative: 25 % (ref 12–46)
Lymphs Abs: 0.7 10*3/uL (ref 0.7–4.0)
Monocytes Absolute: 0.3 10*3/uL (ref 0.1–1.0)
Neutro Abs: 1.5 10*3/uL — ABNORMAL LOW (ref 1.7–7.7)

## 2010-11-20 LAB — COMPREHENSIVE METABOLIC PANEL
AST: 17 U/L (ref 0–37)
Albumin: 3.5 g/dL (ref 3.5–5.2)
BUN: 20 mg/dL (ref 6–23)
Calcium: 8.4 mg/dL (ref 8.4–10.5)
Chloride: 98 mEq/L (ref 96–112)
Creatinine, Ser: 1.29 mg/dL — ABNORMAL HIGH (ref 0.4–1.2)
GFR calc Af Amer: 53 mL/min — ABNORMAL LOW (ref >60)
Total Bilirubin: 0.3 mg/dL (ref 0.3–1.2)
Total Protein: 7 g/dL (ref 6.0–8.3)

## 2010-11-20 LAB — URINE CULTURE
Colony Count: NO GROWTH
Culture  Setup Time: 201206050523
Culture  Setup Time: 201206060205
Culture: NO GROWTH

## 2010-11-20 LAB — CBC
MCH: 30.7 pg (ref 26.0–34.0)
MCHC: 33.5 g/dL (ref 30.0–36.0)
Platelets: 251 10*3/uL (ref 150–400)
RDW: 15.9 % — ABNORMAL HIGH (ref 11.5–15.5)

## 2010-11-20 LAB — TSH: TSH: 48.6 u[IU]/mL — ABNORMAL HIGH (ref 0.350–4.500)

## 2010-11-20 LAB — APTT: aPTT: 35 seconds (ref 24–37)

## 2010-11-20 LAB — PHOSPHORUS: Phosphorus: 3.1 mg/dL (ref 2.3–4.6)

## 2010-11-21 LAB — BASIC METABOLIC PANEL
CO2: 29 mEq/L (ref 19–32)
Chloride: 107 mEq/L (ref 96–112)
Creatinine, Ser: 0.91 mg/dL (ref 0.4–1.2)
GFR calc Af Amer: >60 mL/min (ref >60)
Glucose, Bld: 83 mg/dL (ref 70–99)

## 2010-11-21 LAB — T4, FREE: Free T4: 0.65 ng/dL — ABNORMAL LOW (ref 0.80–1.80)

## 2010-11-21 LAB — T3, FREE: T3, Free: 2 pg/mL — ABNORMAL LOW (ref 2.3–4.2)

## 2010-11-21 LAB — VITAMIN B12: Vitamin B-12: 522 pg/mL (ref 211–911)

## 2010-11-21 LAB — CORTISOL-AM, BLOOD: Cortisol - AM: 6.3 ug/dL (ref 4.3–22.4)

## 2010-11-22 LAB — FOLATE RBC: RBC Folate: 618 ng/mL — ABNORMAL HIGH (ref >=366)

## 2010-11-23 DIAGNOSIS — R112 Nausea with vomiting, unspecified: Secondary | ICD-10-CM

## 2010-11-23 DIAGNOSIS — R933 Abnormal findings on diagnostic imaging of other parts of digestive tract: Secondary | ICD-10-CM

## 2010-11-23 DIAGNOSIS — R197 Diarrhea, unspecified: Secondary | ICD-10-CM

## 2010-11-23 LAB — ACTH STIMULATION, 3 TIME POINTS: Cortisol, 30 Min: 23.4 ug/dL (ref >20)

## 2010-11-29 ENCOUNTER — Ambulatory Visit
Admit: 2010-11-29 | Discharge: 2010-11-29 | Disposition: A | Discharge status: Home / Self Care | Payer: Medicaid Other | Attending: Radiation Oncology | Admitting: Radiation Oncology

## 2010-11-29 DIAGNOSIS — C321 Malignant neoplasm of supraglottis: Secondary | ICD-10-CM | POA: Insufficient documentation

## 2010-12-02 NOTE — Discharge Summary (Signed)
NAME:  Deborah [name], Deborah [name] NO.:  [id number]  MEDICAL RECORD NO.:  [id number]  LOCATION:                                 FACILITY:  PHYSICIAN:  Marinda Elk, M.D.DATE OF BIRTH:  Aug 01, 1955  DATE OF ADMISSION: DATE OF DISCHARGE:                              DISCHARGE SUMMARY   PRIMARY CARE DOCTOR:  None.  ONCOLOGIST:  Dr. Renaldo Reel.  RADIATION ONCOLOGIST:  Radene Gunning, MD, PhD  EARS, NOSE, THROAT:  Dr. Abbey Chatters.  DISCHARGE DIAGNOSES: 1. Nausea and vomiting secondary to dislodged percutaneous endoscopic     gastrostomy tube and balloon.  Status post     esophagogastroduodenoscopy. 2. Diarrhea, resolved. 3. Hypothyroidism. 4. Normocytic anemia. 5. Nutrition.  DISCHARGE MEDICATIONS: 1. Zofran 8 mg per tube q.8 h. p.r.n. 2. Synthroid 75 mcg in the morning. 3. Albuterol 2 puffs inhaled q.4 h. 4. Biafine topical emulsion b.i.d. 5. Combivent 2 puffs b.i.d. 6. Fentanyl patch 50 mcg transdermally every 3 days as needed. 7. Vicodin 7.5 one tablet q.4 h. p.r.n. 8. Jevity 40-60 mL by mouth. 9. Lorazepam 0.5 mg tablet q.6 h. 10.Metoprolol 25 mg b.i.d. 11.Protonix one teaspoon daily. 12.Chlorpromazine 10 mg q.6 h.  CONSULTANTS:  Jordan Hawks. Elnoria Howard, MD  PROCEDURES:  EGD that showed that the balloon portion of the gastrostomy tube was found to be occluding the lumen of the duodenum.  This was replaced and secured in position.  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 In the ER, she was found to have acute renal failure and very dehydrated.  She was given antiemetics and IV fluids.  She was asked to be admitted, but she refused to admission and went home, but came back later again with similar complaints of nausea, vomiting,  and abdominal pain for about a week, so we were asked to admit and further evaluate.  PHYSICAL EXAMINATION:  VITAL SIGNS:  Temperature 98, pulse of 79, blood pressure 129/84, respirations 16, she was satting 95% on room air. GENERAL:  Awake, alert, and oriented x3, in no acute distress, lying comfortably in bed. CARDIOVASCULAR:  Regular rate and rhythm with positive S1 and S2.  No murmurs, rubs, or gallops. HEENT:  Pupils are equally round and reactive to light and accommodation. NECK:  No JVD. LUNGS:  Good air movement.  Clear to auscultation. ABDOMEN:  Positive bowel sounds, nontender, nondistended, soft. EXTREMITIES:  Positive pulses.  No clubbing, cyanosis, or edema. NEUROLOGIC:  Nonfocal.  Labs on admission shows a white count of 11.3, platelet count of 13. Sodium 134, potassium 3.4, chloride 86, bicarb of 35, glucose of 102, BUN of 28, creatinine of 1.8.  ASSESSMENT AND PLAN: 1. Nausea, vomiting, and abdominal pain.  She was started on Protonix     with no improvement.  Her nutrition Jevity was changed and diluted    secondary to the diarrhea, but her nausea and vomiting did not     improve.  So, Gastroenterology was called, they did perform an EGD  that showed results above.  After this, she was tolerating her diet     well.  She was not having any more diarrhea and no further     complications.  So, she was discharged in stable condition. 2. Diarrhea, resolved, probably secondary to boluses of Jevity.  She     had no white count and no fever.  Two days after admission, she had     no further diarrheas.  Her cortisol was checked and an ACTH that     was within normal limits. 3. Hypothyroidism.  She was not on any thyroid medication, this was     started.  She will continue with 75 mcg and will follow up with TSH     in 6 weeks.  I have consulted with her that she needs to go to     primary care doctor in order to follow this up because this will     have to be  titrated. 4. Normocytic anemia.  No changes were made. 5. Nutrition.  Her Jevity was diluted and changed, and she will give     herself a lower dose of the bolus of her Jevity.  VITAL SIGNS ON DAY OF DISCHARGE:  Temperature 98, pulse 83, respirations 18, blood pressure 155/93, she was satting 98% on 2 L.  LABORATORY DATA ON DAY OF DISCHARGE:  ACTH stimulation test at 3 o'clock of cortisol 2.0, cortisol 23 at 30 minutes and 26 at 60 minutes.  Her RBC folate was 618, her ferritin was 223.     Marinda Elk, M.D.    AF/MEDQ  D:  11/23/2010  T:  11/23/2010  Job:  [id number]  Electronically Signed by Marinda Elk M.D. on 12/02/2010 10:51:20 AM

## 2010-12-09 NOTE — H&P (Signed)
NAME:  Deborah [name], Deborah [name] NO.:  [id number]  MEDICAL RECORD NO.:  [id number]  LOCATION:  1418                         FACILITY:  [facility]  PHYSICIAN:  Kathlen Mody, MD       DATE OF BIRTH:  29-Jul-1955  DATE OF ADMISSION:  11/19/2010 DATE OF DISCHARGE:                             HISTORY & PHYSICAL   PRIMARY CARE PHYSICIAN:  None.  ONCOLOGIST:  Dr. Elmarie Shiley.  RADIATION ONCOLOGIST:  Dr. Mitzi Hansen.  ENT:  Dr. [name].  CHIEF COMPLAINT:  Nausea, vomiting, and abdominal pain since 1 week.  HISTORY OF PRESENT ILLNESS:  This is a 55 year old lady with history of squamous cell carcinoma of the larynx, status post total laryngectomy with radical neck dissection in February 2012 with a history of tracheostomy, on trach collar, and a history of gastrostomy with a PEG tube, came into the ER yesterday, complaining of nausea, vomiting, and abdominal pain for about a week.  In the ER, she was found to have acute renal failure, very dehydrated.  She was given antiemetics and IV fluids.  She was asked to be admitted, but she refused the admission and went home, came back again later today with similar complaints of nausea, vomiting, and abdominal pain going on for about a week.  She also has diarrhea this morning, watery diarrhea for about an hour.  She denies any fever, but reports chills over the last 1 week.  She also reports syncope yesterday where she bent down to lower the air conditioner, felt dizzy and passed out.  Nobody witnessed her syncope, but her sister was in the house.  When she fell to the ground, her sister came and tried to stand her up and as per the sister and the patient, syncope might have lasted for less than a minute.  The patient had a CT head without contrast in the ER yesterday, did not show any acute findings.  The patient denies any chest pain, shortness of breath, or cough.  She denies any urinary complaints.  She denies any tingling or numbness in her  extremities.  She denies any weakness other than the dizziness that she felt yesterday before the syncope episode.  She denies any headache or blurry vision.  The patient states she used antibiotics about a month ago when she was admitted to the hospital for stromal obstruction.  REVIEW OF SYSTEMS:  See HPI, otherwise negative.  PAST MEDICAL HISTORY: 1. History of laryngeal squamous cell carcinoma, status post total     laryngectomy and radical neck dissection, undergoing radiation and     chemotherapy. 2. Hypertension. 3. Ex-tobacco abuse. 4. Asthma. 5. Intermittent stromal obstruction. 6. History of leukopenia. 7. History of gastrostomy tube placement. 8. History of post emergent tracheostomy.  DISCHARGE MEDICATIONS:  Please see med recon for the list of medications and their doses.  SOCIAL HISTORY:  The patient lives with her sister.  She is a former smoker.  Denies any EtOH or recreational drug use.  FAMILY HISTORY:  Cardiac history in mother.  ALLERGIES:  No known drug allergies.  PHYSICAL EXAMINATION:  VITAL SIGNS:  The patient's temperature of 98.7, pulse of 79, blood pressure 129/84, respirations 16, and saturating 95% on  room air. GENERAL:  On exam, she is alert, afebrile, oriented x3, and comfortable, no acute distress. CARDIOVASCULAR:  S1 and S2. HEENT:  Pupils equal and reacting to light.  She is on trach collar. Atraumatic and normocephalic.  Dry mucous membranes. NECK:  No JVD. CHEST:  Clear to auscultation bilaterally.  No wheezing or rhonchi. ABDOMEN:  Soft and tender in the lower quadrants.  No rebound tenderness.  Bowel sounds are decreased.  The patient has a gastrostomy tube.  Gastrostomy site does not show any sign of infection. EXTREMITIES:  No pedal edema.  Pulses symmetrical. NEUROLOGICAL:  Nonfocal at this time.  PERTINENT LABORATORY DATA:  The patient had urinalysis, which show small leukocytes with many bacteria.  CBC significant for  hemoglobin of 11.3, hematocrit of 34.1, and platelets of 316.  Basic metabolic panel significant for sodium of 134, potassium 3.2, chloride 86, bicarbonate 35, glucose 102, BUN 28, and creatinine 1.8.  RADIOLOGY:  The patient had an acute abdominal series yesterday, shows no acute abdominal process.  No acute cardiopulmonary findings.  CT head without contrast shows no evidence of intracranial hemorrhage, opacification of the left maxillary antrum suggesting acute sinusitis.  ASSESSMENT AND PLAN:  This is a 55 year old lady with history of squamous cell carcinoma of the larynx, status post total laryngectomy, status post emergent tracheostomy, on trach collar, getting chemoradiation, recently admitted for intermittent stromal obstruction, had a dilatation done last month, came in complaining of persistent nausea, vomiting, and abdominal pain and diarrhea this morning.  The patient is being admitted to Telemetry for closer monitoring.  This is most likely viral gastroenteritis, but since the patient has been on antibiotics less than a month ago, we will try to rule out Clostridium difficile colitis with Clostridium difficile PCR.  We will also get a CT abdomen and pelvis with oral contrast as the patient is in acute renal failure at this time to see if she has any colitis.  The patient takes both p.o. and Jevity feeds through the PEG tube, so we will put her on clear liquid diet and Jevity feeds as per the nutritionist.  We will start the patient on IV fluids, normal saline at 100 cc/hour, IV Zofran as needed, IV Dilaudid 0.5 to 1 mg IV q.4 h. p.r.n.  Acute renal failure most likely prerenal secondary to persistent nausea, vomiting, and dehydration.  The patient is being hydrated with IV fluids, normal saline at 100 cc per hour.  We will repeat basic metabolic panel in the morning.  Hyponatremia most likely secondary to dehydration.  She is being started on IV fluids.  Hypokalemia,  potassium will be repleted.  Anemia.  The patient's baseline hemoglobin runs 9 to 10.  This hemoglobin of 11.3 could be secondary to hemoconcentration and dehydration.  Abnormal urinalysis.  Her urinalysis shows small leukocytes with many bacteria.  She does not have these symptoms at this time, but we will go ahead and start empirically on IV Rocephin.  Urine culture is pending. She can discontinue the antibiotics if the urine culture is negative.  Hypertension.  The patient's blood pressure appears to be controlled. We will hold off the blood pressure medication up until the morning.  Syncope most likely secondary to orthostatic hypotension.  We will get orthostatics at bedside.  Her CT head without contrast did not show any acute findings.  We will also get a 2-D echocardiogram in the morning.  Squamous cell carcinoma of the larynx, status post laryngectomy, status post chemoradiation on  trach collar.  We will call Oncology as needed.  Asthma appears to be stable.  Continue with albuterol inhaler q.4 h. p.r.n.  The patient is full code.          ______________________________ Kathlen Mody, MD     VA/MEDQ  D:  11/19/2010  T:  11/19/2010  Job:  [id number]  Electronically Signed by Kathlen Mody MD on 12/09/2010 02:26:15 AM

## 2010-12-11 NOTE — Consult Note (Signed)
NAME:  Deborah [name], Deborah [name] NO.:  [id number]  MEDICAL RECORD NO.:  [id number]  LOCATION:                                 FACILITY:  PHYSICIAN:  Jordan Hawks. Elnoria Howard, MD    DATE OF BIRTH:  1955-07-13  DATE OF CONSULTATION:  11/21/2010 DATE OF DISCHARGE:                                CONSULTATION   REASON FOR CONSULTATION:  Nausea and vomiting, abdominal pain, and also diarrhea.  This is unassigned [facility] patient.  ONCOLOGIST:  Winfield Cunas. Gaylyn Rong, M.D.  RADIATION ONCOLOGIST:  Radene Gunning, M.D., Ph.D.  ENT:  [name]. [name], MD  HISTORY OF PRESENT ILLNESS:  This is a 55 year old female with a past medical history of squamous cell carcinoma of the larynx and status post laryngectomy with radical neck dissection, hypertension, asthma, and status post surgical gastrostomy placement in January 2012 who was admitted to the hospital with complaints of abdominal pain, nausea, and vomiting as well as diarrhea.  The patient reports that the symptoms started approximately 5 days ago.  Communication is difficult with the patient and she is status post laryngectomy.  From what I can discern, her symptoms are 5 days ago and she had acute onset of nausea, vomiting, and abdominal pain.  She reports having approximately 3 bouts of watery diarrhea per day.  It typically occurs once she starts on her tube feeds, it rapidly drains out of her once she gets the infusion through the PEG.  As a result of symptoms, she presented to the emergency room for further evaluation and treatment.  Upon evaluation, she was noted to be dehydrated.  Because of abdominal pain, a CT scan was performed and it revealed that her gastrostomy tube appears to be functional, but the balloon that is in the gastrostomy is dilated up to approximately 4 cm and it traverses around the 2nd and 3rd portions of the duodenum.  There was mention that it may be causing obstruction type of event.  As a result of the  symptoms, a GI consultation was requested for further evaluation and treatment.  PAST MEDICAL HISTORY AND PAST SURGICAL HISTORY:  As stated above.  FAMILY HISTORY:  Noncontributory.  SOCIAL HISTORY:  Negative for alcohol or illicit drug use.  She is a former smoker.  ALLERGIES:  No known drug allergies.  MEDICATIONS: 1. Albuterol 2.5 mg inhaler b.i.d. 2. Lovenox 40 mg subcu daily. 3. Atrovent 0.5 mg inhaler b.i.d. 4. Protonix 40 mg p.o. b.i.d. 5. Dilaudid 0.5-1 mg IV q.4 h. 6. Zofran 4 mg p.o. q.6 h. 7. Phenergan 6.25 mg IV q.6 h.  PHYSICAL EXAMINATION:  VITAL SIGNS:  Blood pressure is 121/78, heart rate is 90, respirations 16, temperature is 98.8. GENERAL:  The patient is in no acute distress, alert, and oriented. HEENT:  Normocephalic, atraumatic.  Extraocular muscles intact. NECK:  Supple.  She has a tracheostomy anteriorly. LUNGS:  Clear to auscultation bilaterally. CARDIOVASCULAR:  Regular rate and rhythm. ABDOMEN:  Flat, tender in the epigastric region.  There is a well-healed epigastric scar from the gastrostomy placement.  There is some mild tenderness in the suprapubic region.  No rebound or rigidity.  A positive bowel sounds.  EXTREMITIES:  No clubbing, cyanosis, or edema.  LABORATORY VALUES:  Sodium is 141, potassium 4.3, chloride 107, CO2 of 29, glucose 83, BUN 13, creatinine 0.9.  IMPRESSION: 1. Nausea and vomiting. 2. Diarrhea. 3. Gastrostomy placement.  After further discussion with the patient, it turns out that the original gastrostomy tube fell out of place or and required replacement on 2 occasions, the second time that the gastrostomy was placed was this past April.  She never had any problems with the gastrostomy per her report, but review of the CT scan with Radiology reveals that it has definitely moved distally into the 2nd and 3rd portions of the small bowel.  The CT scan was performed with oral contrast and there is opacification of the small  bowel and the colon, signifying that liquids are able to flow through.  I am uncertain the cause of her nausea and vomiting at this time, but it does deserve further evaluation, I believe that the balloon dilated up to 4 cm is much too large.  Endoscopic evaluation will be appropriate the next step.  As for her diarrhea, I am uncertain about the exact cause of this issue.  It typically occurs after the infusion of the Jevity.  From what I can gather, she does not have diarrhea, she does not receive the tube feeds.  PLAN:  I will perform an EGD and then make further recommendations hopefully with adjustment of the gastrostomy, her symptoms can be improved or resolved.     Jordan Hawks Elnoria Howard, MD     PDH/MEDQ  D:  11/21/2010  T:  11/22/2010  Job:  [id number]  cc:   [name], M.D.  Radene Gunning, M.D., Ph.D. Fax: [phone]  Jefry H. [name], MD Fax: [phone]  Electronically Signed by Jeani Hawking MD on 12/11/2010 06:56:53 AM

## 2010-12-20 ENCOUNTER — Other Ambulatory Visit: Payer: Self-pay | Admitting: Oncology

## 2010-12-20 ENCOUNTER — Encounter (HOSPITAL_BASED_OUTPATIENT_CLINIC_OR_DEPARTMENT_OTHER): Payer: Medicaid Other | Admitting: Oncology

## 2010-12-20 DIAGNOSIS — E441 Mild protein-calorie malnutrition: Secondary | ICD-10-CM

## 2010-12-20 DIAGNOSIS — C321 Malignant neoplasm of supraglottis: Secondary | ICD-10-CM

## 2010-12-20 DIAGNOSIS — Z5111 Encounter for antineoplastic chemotherapy: Secondary | ICD-10-CM

## 2010-12-20 LAB — COMPREHENSIVE METABOLIC PANEL
BUN: 18 mg/dL (ref 6–23)
CO2: 27 mEq/L (ref 19–32)
Calcium: 9.5 mg/dL (ref 8.4–10.5)
Chloride: 104 mEq/L (ref 96–112)
Creatinine, Ser: 1.08 mg/dL (ref 0.50–1.10)

## 2010-12-20 LAB — CBC WITH DIFFERENTIAL/PLATELET
Basophils Absolute: 0 10*3/uL (ref 0.0–0.1)
EOS%: 3.7 % (ref 0.0–7.0)
HCT: 29.8 % — ABNORMAL LOW (ref 34.8–46.6)
HGB: 10.1 g/dL — ABNORMAL LOW (ref 11.6–15.9)
MCH: 32.2 pg (ref 25.1–34.0)
MONO#: 0.4 10*3/uL (ref 0.1–0.9)
NEUT%: 72.6 % (ref 38.4–76.8)
Platelets: 295 10*3/uL (ref 145–400)
lymph#: 0.4 10*3/uL — ABNORMAL LOW (ref 0.9–3.3)

## 2010-12-20 LAB — MAGNESIUM: Magnesium: 2.2 mg/dL (ref 1.5–2.5)

## 2011-01-15 ENCOUNTER — Other Ambulatory Visit: Payer: Self-pay | Admitting: Oncology

## 2011-01-15 DIAGNOSIS — C329 Malignant neoplasm of larynx, unspecified: Secondary | ICD-10-CM

## 2011-01-17 ENCOUNTER — Ambulatory Visit (HOSPITAL_COMMUNITY)
Admission: RE | Admit: 2011-01-17 | Discharge: 2011-01-17 | Disposition: A | Discharge status: Home / Self Care | Payer: Medicaid Other | Source: Ambulatory Visit | Attending: Oncology | Admitting: Oncology

## 2011-01-17 ENCOUNTER — Other Ambulatory Visit (HOSPITAL_COMMUNITY): Payer: Self-pay

## 2011-01-17 DIAGNOSIS — C329 Malignant neoplasm of larynx, unspecified: Secondary | ICD-10-CM

## 2011-01-17 MED ORDER — IOHEXOL 300 MG/ML  SOLN
100.0000 mL | Freq: Once | INTRAMUSCULAR | Status: AC | PRN
  Administered 2011-01-17: 100 mL via INTRAVENOUS

## 2011-01-20 ENCOUNTER — Other Ambulatory Visit: Payer: Self-pay | Admitting: Oncology

## 2011-01-20 ENCOUNTER — Encounter (HOSPITAL_BASED_OUTPATIENT_CLINIC_OR_DEPARTMENT_OTHER): Payer: Medicaid Other | Admitting: Oncology

## 2011-01-20 DIAGNOSIS — C329 Malignant neoplasm of larynx, unspecified: Secondary | ICD-10-CM

## 2011-01-20 DIAGNOSIS — C321 Malignant neoplasm of supraglottis: Secondary | ICD-10-CM

## 2011-01-20 DIAGNOSIS — E441 Mild protein-calorie malnutrition: Secondary | ICD-10-CM

## 2011-01-20 DIAGNOSIS — Z5111 Encounter for antineoplastic chemotherapy: Secondary | ICD-10-CM

## 2011-01-20 DIAGNOSIS — I10 Essential (primary) hypertension: Secondary | ICD-10-CM

## 2011-01-20 DIAGNOSIS — D72819 Decreased white blood cell count, unspecified: Secondary | ICD-10-CM

## 2011-01-20 LAB — COMPREHENSIVE METABOLIC PANEL
Albumin: 4.1 g/dL (ref 3.5–5.2)
Alkaline Phosphatase: 59 U/L (ref 39–117)
CO2: 26 mEq/L (ref 19–32)
Glucose, Bld: 63 mg/dL — ABNORMAL LOW (ref 70–99)
Potassium: 4.5 mEq/L (ref 3.5–5.3)
Sodium: 140 mEq/L (ref 135–145)
Total Protein: 7.3 g/dL (ref 6.0–8.3)

## 2011-01-20 LAB — CBC WITH DIFFERENTIAL/PLATELET
Basophils Absolute: 0 10*3/uL (ref 0.0–0.1)
Eosinophils Absolute: 0.1 10*3/uL (ref 0.0–0.5)
HGB: 9.9 g/dL — ABNORMAL LOW (ref 11.6–15.9)
NEUT#: 1.7 10*3/uL (ref 1.5–6.5)
RDW: 12.8 % (ref 11.2–14.5)
lymph#: 0.4 10*3/uL — ABNORMAL LOW (ref 0.9–3.3)

## 2011-01-20 LAB — MAGNESIUM: Magnesium: 2 mg/dL (ref 1.5–2.5)

## 2011-01-22 DIAGNOSIS — K08109 Complete loss of teeth, unspecified cause, unspecified class: Secondary | ICD-10-CM

## 2011-01-27 ENCOUNTER — Emergency Department (HOSPITAL_COMMUNITY)
Admission: EM | Admit: 2011-01-27 | Discharge: 2011-01-28 | Disposition: A | Discharge status: Home / Self Care | Payer: Medicaid Other | Attending: Emergency Medicine | Admitting: Emergency Medicine

## 2011-01-27 DIAGNOSIS — R05 Cough: Secondary | ICD-10-CM | POA: Insufficient documentation

## 2011-01-27 DIAGNOSIS — R059 Cough, unspecified: Secondary | ICD-10-CM | POA: Insufficient documentation

## 2011-01-27 DIAGNOSIS — I10 Essential (primary) hypertension: Secondary | ICD-10-CM | POA: Insufficient documentation

## 2011-01-27 DIAGNOSIS — Z8521 Personal history of malignant neoplasm of larynx: Secondary | ICD-10-CM | POA: Insufficient documentation

## 2011-01-27 DIAGNOSIS — Z930 Tracheostomy status: Secondary | ICD-10-CM | POA: Insufficient documentation

## 2011-01-28 ENCOUNTER — Emergency Department (HOSPITAL_COMMUNITY): Payer: Medicaid Other

## 2011-01-30 ENCOUNTER — Encounter (HOSPITAL_COMMUNITY): Payer: Self-pay | Admitting: Dentistry

## 2011-01-30 DIAGNOSIS — K08109 Complete loss of teeth, unspecified cause, unspecified class: Secondary | ICD-10-CM

## 2011-02-03 ENCOUNTER — Ambulatory Visit (HOSPITAL_COMMUNITY): Admission: RE | Admit: 2011-02-03 | Payer: Medicaid Other | Source: Ambulatory Visit

## 2011-02-04 ENCOUNTER — Encounter (HOSPITAL_COMMUNITY)
Admission: RE | Admit: 2011-02-04 | Discharge: 2011-02-04 | Disposition: A | Discharge status: Home / Self Care | Payer: Medicaid Other | Source: Ambulatory Visit | Attending: Otolaryngology | Admitting: Otolaryngology

## 2011-02-04 LAB — CBC
MCV: 92.3 fL (ref 78.0–100.0)
Platelets: 278 10*3/uL (ref 150–400)
RDW: 12.5 % (ref 11.5–15.5)
WBC: 3.3 10*3/uL — ABNORMAL LOW (ref 4.0–10.5)

## 2011-02-04 LAB — SURGICAL PCR SCREEN: MRSA, PCR: NEGATIVE

## 2011-02-04 LAB — BASIC METABOLIC PANEL
Calcium: 10 mg/dL (ref 8.4–10.5)
Chloride: 101 mEq/L (ref 96–112)
Creatinine, Ser: 0.96 mg/dL (ref 0.50–1.10)
GFR calc Af Amer: >60 mL/min (ref >60)
GFR calc non Af Amer: >60 mL/min (ref >60)

## 2011-02-05 ENCOUNTER — Ambulatory Visit (HOSPITAL_COMMUNITY)
Admission: RE | Admit: 2011-02-05 | Discharge: 2011-02-06 | Disposition: A | Discharge status: Home / Self Care | Payer: Medicaid Other | Source: Ambulatory Visit | Attending: Otolaryngology | Admitting: Otolaryngology

## 2011-02-05 DIAGNOSIS — Z01812 Encounter for preprocedural laboratory examination: Secondary | ICD-10-CM | POA: Insufficient documentation

## 2011-02-05 DIAGNOSIS — I10 Essential (primary) hypertension: Secondary | ICD-10-CM | POA: Insufficient documentation

## 2011-02-05 DIAGNOSIS — J45909 Unspecified asthma, uncomplicated: Secondary | ICD-10-CM | POA: Insufficient documentation

## 2011-02-05 DIAGNOSIS — Z930 Tracheostomy status: Secondary | ICD-10-CM | POA: Insufficient documentation

## 2011-02-05 DIAGNOSIS — Z9089 Acquired absence of other organs: Secondary | ICD-10-CM | POA: Insufficient documentation

## 2011-02-05 DIAGNOSIS — E039 Hypothyroidism, unspecified: Secondary | ICD-10-CM | POA: Insufficient documentation

## 2011-02-05 DIAGNOSIS — Z87891 Personal history of nicotine dependence: Secondary | ICD-10-CM | POA: Insufficient documentation

## 2011-02-05 DIAGNOSIS — R491 Aphonia: Secondary | ICD-10-CM | POA: Insufficient documentation

## 2011-02-05 DIAGNOSIS — Z8521 Personal history of malignant neoplasm of larynx: Secondary | ICD-10-CM | POA: Insufficient documentation

## 2011-02-06 NOTE — Op Note (Signed)
NAME:  RUTHANNA, MACCHIA            ACCOUNT NO.:  [id number]  MEDICAL RECORD NO.:  [id number]  LOCATION:  [facility]                         FACILITY:  [facility]  PHYSICIAN:  Aronda Burford H. [name], MD     DATE OF BIRTH:  [birth year]  DATE OF PROCEDURE:  02/05/2011 DATE OF DISCHARGE:                              OPERATIVE REPORT   PREOPERATIVE DIAGNOSES:  Status post laryngectomy, aphonia, history of laryngeal cancer.  POSTOPERATIVE DIAGNOSES:  Status post laryngectomy, aphonia, history of laryngeal cancer.  PROCEDURE: 1. Cricopharyngeal myotomy. 2. Tracheoesophageal puncture with a Blom-Singer valve, voice     prosthesis, and placement.  SURGEON:  Charmayne Odell H. [name], MD  ANESTHESIA:  General endotracheal anesthesia was used.  COMPLICATIONS:  No complications.  BLOOD LOSS:  None.  SPECIMENS:  No specimen.  FINDINGS:  No abnormal findings.  HISTORY:  A 55 year old underwent total laryngectomy about 8 months ago and had postop radiation.  She has done well and is here for voice rehabilitation.  Preop air insufflation test revealed tight cricopharyngeal sphincter.  Risks, benefits, alternatives, and complications of the procedure were explained to the patient.  She seemed to understand and agreed to surgery.  PROCEDURE:  The patient was taken to the operating room, placed on the operating table in supine position.  Following induction of general endotracheal anesthesia using the tracheal stoma, the neck was prepped and draped in standard fashion. 1. Cricopharyngeal myotomy.  A 42 French Bougie dilator was inserted     into the oral cavity, passed into the upper esophagus, and kept in     place throughout the myotomy.  A vertical incision at the midline     just above the stoma was created using electrocautery about 4 cm in     length.  Subcutaneous dissection was accomplished and with gentle     retraction the cricopharyngeal muscle fibers were identified.  They     were carefully divided  between the edge of the clamp using     electrocautery.  The submucosal tissue of the esophagus was exposed     but the mucosa was not.  The wound was irrigated with saline.     There was no bleeding and hemostasis was completed with cautery.     The wound was closed in multiple layers using 4-0 chromic suture     and Dermabond on the skin. 2. Tracheoesophageal puncture.  The Bougie dilator was removed and a     cervical esophagoscope was inserted, turned 108 degrees, the bevel     was up with light shining through the stoma.  The [employer] was     used.  The trocar was inserted about 1/2 cm below the mucocutaneous     junction and superior aspect of the stoma and the tip was seen     through the esophagoscope.  This was passed into the esophagoscope     and the guidewire was then passed through the trocar.  The scope     was removed.  The indwelling prosthesis was attached to the     guidewire and then advanced through the oral cavity and then back     through the puncture site.  The  flange was all brought through the     puncture site.  The guidewire attachment arm was cut off at the     flange and the flange was then rotated 180 degrees and was well     seated.  The device was irrigated with saline.  The patient was     then awakened, extubated, and transferred to recovery in stable     condition.  A 20-French 8-mm prosthesis was used.     Kisean Rollo H. [name], MD     JHR/MEDQ  D:  02/05/2011  T:  02/05/2011  Job:  [id number]  Electronically Signed by Serena Colonel MD on 02/06/2011 01:09:16 PM

## 2011-02-10 ENCOUNTER — Encounter (HOSPITAL_COMMUNITY): Payer: Self-pay | Admitting: Dentistry

## 2011-02-10 DIAGNOSIS — K08109 Complete loss of teeth, unspecified cause, unspecified class: Secondary | ICD-10-CM

## 2011-02-19 ENCOUNTER — Ambulatory Visit (HOSPITAL_COMMUNITY): Payer: Self-pay | Admitting: Dentistry

## 2011-02-19 DIAGNOSIS — K08109 Complete loss of teeth, unspecified cause, unspecified class: Secondary | ICD-10-CM

## 2011-02-27 DIAGNOSIS — K117 Disturbances of salivary secretion: Secondary | ICD-10-CM

## 2011-02-27 DIAGNOSIS — K08109 Complete loss of teeth, unspecified cause, unspecified class: Secondary | ICD-10-CM

## 2011-03-03 ENCOUNTER — Ambulatory Visit (HOSPITAL_COMMUNITY): Payer: Self-pay | Admitting: Dentistry

## 2011-03-03 DIAGNOSIS — K1370 Unspecified lesions of oral mucosa: Secondary | ICD-10-CM

## 2011-03-05 ENCOUNTER — Ambulatory Visit: Payer: Self-pay | Admitting: Family Medicine

## 2011-03-10 ENCOUNTER — Encounter (HOSPITAL_COMMUNITY): Payer: Self-pay | Admitting: Dentistry

## 2011-03-10 DIAGNOSIS — K1370 Unspecified lesions of oral mucosa: Secondary | ICD-10-CM

## 2011-03-17 ENCOUNTER — Encounter (HOSPITAL_COMMUNITY): Payer: Self-pay | Admitting: Dentistry

## 2011-03-17 DIAGNOSIS — K1370 Unspecified lesions of oral mucosa: Secondary | ICD-10-CM

## 2011-03-19 ENCOUNTER — Ambulatory Visit (INDEPENDENT_AMBULATORY_CARE_PROVIDER_SITE_OTHER): Payer: Medicaid Other | Admitting: Family Medicine

## 2011-03-19 ENCOUNTER — Encounter: Payer: Self-pay | Admitting: Family Medicine

## 2011-03-19 DIAGNOSIS — E1159 Type 2 diabetes mellitus with other circulatory complications: Secondary | ICD-10-CM | POA: Insufficient documentation

## 2011-03-19 DIAGNOSIS — Z23 Encounter for immunization: Secondary | ICD-10-CM

## 2011-03-19 DIAGNOSIS — Z87891 Personal history of nicotine dependence: Secondary | ICD-10-CM | POA: Insufficient documentation

## 2011-03-19 DIAGNOSIS — I10 Essential (primary) hypertension: Secondary | ICD-10-CM

## 2011-03-19 DIAGNOSIS — E039 Hypothyroidism, unspecified: Secondary | ICD-10-CM | POA: Insufficient documentation

## 2011-03-19 DIAGNOSIS — Z1231 Encounter for screening mammogram for malignant neoplasm of breast: Secondary | ICD-10-CM | POA: Insufficient documentation

## 2011-03-19 HISTORY — DX: Personal history of nicotine dependence: Z87.891

## 2011-03-19 MED ORDER — METOPROLOL TARTRATE 25 MG PO TABS: 25.0000 mg | ORAL_TABLET | Freq: Two times a day (BID) | ORAL | Status: DC

## 2011-03-19 MED ORDER — LISINOPRIL 10 MG PO TABS: 10.0000 mg | ORAL_TABLET | Freq: Every day | ORAL | Status: DC

## 2011-03-19 NOTE — Assessment & Plan Note (Signed)
Referral for screening mammogram 

## 2011-03-19 NOTE — Assessment & Plan Note (Signed)
A: no signs or symptoms of high or low thyroid function. P: reassess TSH, free T3 and free T4 since pt new to synthroid. Follow-up with med refill based on studies

## 2011-03-19 NOTE — Assessment & Plan Note (Signed)
A: taking wellbutrin and using nicotine patch. Not smoking P: continue to follow, Support as needed.

## 2011-03-19 NOTE — Progress Notes (Signed)
  Subjective:    Patient ID: Deborah [name], female    DOB: Jul 10, 1955, 55 y.o.   MRN: [id number]  HPI New patient here to establish primary care.  Concerns: medication refills needed. No other concerns.  Reviewed medications: taking Augmentin x 5 D for upper resp infection related to tracheostomy.  Screening/immunizations: has not had screening mammogram, colonoscopy, lipids. She desires flu shot today.   Past Medical History  Diagnosis Date  . Hypertension   . Tracheostomy dependent   . Gastrostomy feeding   . History of laryngectomy   . Bronchitis   . 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  [name] Heart murmur     asymptomatic    History   Social History  . Marital Status: Divorced    Spouse Name: N/A    Number of Children: N/A  . Years of Education: 12   Occupational History  . Not on file.   Social History Main Topics  . Smoking status: Former Smoker    Types: Cigarettes    Quit date: 09/17/2010  . Smokeless tobacco: Not on file  . Alcohol Use: No     Former drinker 6 months ago  . Drug Use: No     Former cocaine user 6 month ago  . Sexually Active: Yes    Birth Control/ Protection: Surgical   Other Topics Concern  . Not on file   Social History Narrative   Unemployed. Receives SSI. Lives with one of her 2 adult daughters Deborah [name]) and her sister Deborah [name])    Review of Systems  Constitutional: Negative.   HENT: Negative.   Eyes: Negative.   Respiratory: Negative.   Cardiovascular: Negative.   Gastrointestinal: Negative.   Genitourinary: Negative.   Musculoskeletal: Negative.   Skin: Positive for wound.       Pain and swelling along tracheostomy site.   Hematological: Negative.   Psychiatric/Behavioral: Negative.        Objective:   Physical Exam  Nursing note and vitals reviewed. Constitutional: She is oriented to person, place, and time. She appears well-developed and  well-nourished. No distress.  HENT:  Head: Normocephalic and atraumatic.  Cardiovascular: Normal rate, regular rhythm and intact distal pulses.   Murmur heard.  Systolic murmur is present with a grade of 2/6  Pulmonary/Chest: Effort normal and breath sounds normal.  Abdominal: Soft. Bowel sounds are normal.  Musculoskeletal: Normal range of motion. She exhibits no edema and no tenderness.  Neurological: She is alert and oriented to person, place, and time. She has normal reflexes.  Skin: Skin is warm and dry.             Assessment & Plan:

## 2011-03-19 NOTE — Patient Instructions (Signed)
Ms. Escamilla,  It was a pleasure meeting you today. Thank you for coming in.  Please return for your blood work and schedule your screening mammogram. I will call you with any change to your synthroid dose if needed as soon as a get the results of your blood work.   -Dr. Armen Pickup

## 2011-03-19 NOTE — Assessment & Plan Note (Addendum)
A: BP well controlled. Meds: compliant. No side effects. Reviewed recent BMET. P: continue current regimen. Screen for hyperlipidemia.

## 2011-03-20 ENCOUNTER — Other Ambulatory Visit: Payer: Medicaid Other

## 2011-03-20 DIAGNOSIS — I10 Essential (primary) hypertension: Secondary | ICD-10-CM

## 2011-03-20 DIAGNOSIS — E039 Hypothyroidism, unspecified: Secondary | ICD-10-CM

## 2011-03-20 LAB — LIPID PANEL
LDL Cholesterol: 85 mg/dL (ref 0–99)
Triglycerides: 74 mg/dL (ref <150)

## 2011-03-20 LAB — TSH: TSH: 28.157 u[IU]/mL — ABNORMAL HIGH (ref 0.350–4.500)

## 2011-03-20 LAB — T4, FREE: Free T4: 0.75 ng/dL — ABNORMAL LOW (ref 0.80–1.80)

## 2011-03-20 LAB — T3, FREE: T3, Free: 2.3 pg/mL (ref 2.3–4.2)

## 2011-03-20 NOTE — Progress Notes (Signed)
Flp,tsh,f-t3 and f-t4 done today [facility] Khameron Gruenwald

## 2011-03-26 ENCOUNTER — Telehealth: Payer: Self-pay | Admitting: Family Medicine

## 2011-03-26 MED ORDER — LEVOTHYROXINE SODIUM 88 MCG PO TABS: 88.0000 ug | ORAL_TABLET | Freq: Every day | ORAL | Status: DC

## 2011-03-26 NOTE — Telephone Encounter (Signed)
Message copied by [name] on Wed Mar 26, 2011 12:18 PM ------      Message from: Nestor Ramp      Created: Tue Mar 25, 2011  4:03 PM                   ----- Message -----         From: [facility] Interface         Sent: 03/20/2011   8:34 PM           To: Denny Levy, MD

## 2011-03-26 NOTE — Telephone Encounter (Signed)
Called pt at home. Left VM. Increasing dose of synthroid from 75--> q D. Pt to pick up increased dose when out of current meds. Pt to return for repeat TFTs in 6 weeks.

## 2011-03-28 ENCOUNTER — Ambulatory Visit (HOSPITAL_COMMUNITY): Payer: Medicaid Other

## 2011-04-02 ENCOUNTER — Ambulatory Visit (HOSPITAL_COMMUNITY)
Admission: RE | Admit: 2011-04-02 | Discharge: 2011-04-02 | Disposition: A | Discharge status: Home / Self Care | Payer: Medicaid Other | Source: Ambulatory Visit | Attending: Family Medicine | Admitting: Family Medicine

## 2011-04-02 DIAGNOSIS — Z1231 Encounter for screening mammogram for malignant neoplasm of breast: Secondary | ICD-10-CM | POA: Insufficient documentation

## 2011-04-04 ENCOUNTER — Ambulatory Visit: Payer: Medicaid Other | Admitting: Radiation Oncology

## 2011-04-06 ENCOUNTER — Encounter: Payer: Self-pay | Admitting: Oncology

## 2011-04-06 DIAGNOSIS — D72819 Decreased white blood cell count, unspecified: Secondary | ICD-10-CM | POA: Insufficient documentation

## 2011-04-08 ENCOUNTER — Ambulatory Visit (HOSPITAL_COMMUNITY): Payer: Self-pay | Admitting: Dentistry

## 2011-04-08 DIAGNOSIS — K1370 Unspecified lesions of oral mucosa: Secondary | ICD-10-CM

## 2011-04-11 ENCOUNTER — Ambulatory Visit
Admission: RE | Admit: 2011-04-11 | Discharge: 2011-04-11 | Disposition: A | Discharge status: Home / Self Care | Payer: Medicaid Other | Source: Ambulatory Visit | Attending: Radiation Oncology | Admitting: Radiation Oncology

## 2011-04-11 ENCOUNTER — Encounter: Payer: Self-pay | Admitting: *Deleted

## 2011-04-15 ENCOUNTER — Telehealth: Payer: Self-pay | Admitting: *Deleted

## 2011-04-16 ENCOUNTER — Emergency Department (HOSPITAL_COMMUNITY): Payer: Medicaid Other

## 2011-04-16 ENCOUNTER — Emergency Department (HOSPITAL_COMMUNITY)
Admission: EM | Admit: 2011-04-16 | Discharge: 2011-04-17 | Disposition: A | Discharge status: Home / Self Care | Payer: Medicaid Other | Attending: Emergency Medicine | Admitting: Emergency Medicine

## 2011-04-16 DIAGNOSIS — Z8521 Personal history of malignant neoplasm of larynx: Secondary | ICD-10-CM | POA: Insufficient documentation

## 2011-04-16 DIAGNOSIS — R0989 Other specified symptoms and signs involving the circulatory and respiratory systems: Secondary | ICD-10-CM | POA: Insufficient documentation

## 2011-04-16 DIAGNOSIS — R000 Tachycardia, unspecified: Secondary | ICD-10-CM | POA: Insufficient documentation

## 2011-04-16 DIAGNOSIS — L0211 Cutaneous abscess of neck: Secondary | ICD-10-CM | POA: Insufficient documentation

## 2011-04-16 DIAGNOSIS — R0682 Tachypnea, not elsewhere classified: Secondary | ICD-10-CM | POA: Insufficient documentation

## 2011-04-16 DIAGNOSIS — Z930 Tracheostomy status: Secondary | ICD-10-CM | POA: Insufficient documentation

## 2011-04-16 DIAGNOSIS — R0609 Other forms of dyspnea: Secondary | ICD-10-CM | POA: Insufficient documentation

## 2011-04-16 DIAGNOSIS — I10 Essential (primary) hypertension: Secondary | ICD-10-CM | POA: Insufficient documentation

## 2011-04-16 DIAGNOSIS — F411 Generalized anxiety disorder: Secondary | ICD-10-CM | POA: Insufficient documentation

## 2011-04-16 LAB — BASIC METABOLIC PANEL
BUN: 14 mg/dL (ref 6–23)
CO2: 23 mEq/L (ref 19–32)
Calcium: 9.7 mg/dL (ref 8.4–10.5)
Glucose, Bld: 101 mg/dL — ABNORMAL HIGH (ref 70–99)
Sodium: 136 mEq/L (ref 135–145)

## 2011-04-16 LAB — DIFFERENTIAL
Basophils Absolute: 0 10*3/uL (ref 0.0–0.1)
Eosinophils Absolute: 0.2 10*3/uL (ref 0.0–0.7)
Lymphocytes Relative: 14 % (ref 12–46)
Lymphs Abs: 0.8 10*3/uL (ref 0.7–4.0)
Neutrophils Relative %: 73 % (ref 43–77)

## 2011-04-16 LAB — CBC
HCT: 35 % — ABNORMAL LOW (ref 36.0–46.0)
Hemoglobin: 11.6 g/dL — ABNORMAL LOW (ref 12.0–15.0)
MCH: 30.3 pg (ref 26.0–34.0)
MCV: 91.4 fL (ref 78.0–100.0)
RBC: 3.83 MIL/uL — ABNORMAL LOW (ref 3.87–5.11)

## 2011-04-17 ENCOUNTER — Other Ambulatory Visit (HOSPITAL_COMMUNITY): Payer: Self-pay | Admitting: Otolaryngology

## 2011-04-17 ENCOUNTER — Ambulatory Visit (HOSPITAL_COMMUNITY)
Admission: RE | Admit: 2011-04-17 | Discharge: 2011-04-17 | Disposition: A | Discharge status: Home / Self Care | Payer: Medicaid Other | Source: Ambulatory Visit | Attending: Otolaryngology | Admitting: Otolaryngology

## 2011-04-17 ENCOUNTER — Other Ambulatory Visit (HOSPITAL_COMMUNITY): Payer: Self-pay

## 2011-04-17 DIAGNOSIS — R1310 Dysphagia, unspecified: Secondary | ICD-10-CM | POA: Insufficient documentation

## 2011-04-17 DIAGNOSIS — Z8521 Personal history of malignant neoplasm of larynx: Secondary | ICD-10-CM | POA: Insufficient documentation

## 2011-04-17 DIAGNOSIS — K219 Gastro-esophageal reflux disease without esophagitis: Secondary | ICD-10-CM | POA: Insufficient documentation

## 2011-04-21 ENCOUNTER — Ambulatory Visit (HOSPITAL_BASED_OUTPATIENT_CLINIC_OR_DEPARTMENT_OTHER): Payer: Medicaid Other | Admitting: Oncology

## 2011-04-21 ENCOUNTER — Other Ambulatory Visit: Payer: Self-pay | Admitting: Oncology

## 2011-04-21 ENCOUNTER — Other Ambulatory Visit (HOSPITAL_BASED_OUTPATIENT_CLINIC_OR_DEPARTMENT_OTHER): Payer: Medicaid Other | Admitting: Lab

## 2011-04-21 VITALS — BP 115/79 | HR 91 | Temp 98.0°F | Ht 67.0 in | Wt 161.8 lb

## 2011-04-21 DIAGNOSIS — C321 Malignant neoplasm of supraglottis: Secondary | ICD-10-CM

## 2011-04-21 DIAGNOSIS — D72819 Decreased white blood cell count, unspecified: Secondary | ICD-10-CM

## 2011-04-21 DIAGNOSIS — Z8521 Personal history of malignant neoplasm of larynx: Secondary | ICD-10-CM

## 2011-04-21 DIAGNOSIS — E441 Mild protein-calorie malnutrition: Secondary | ICD-10-CM

## 2011-04-21 DIAGNOSIS — E890 Postprocedural hypothyroidism: Secondary | ICD-10-CM

## 2011-04-21 DIAGNOSIS — D649 Anemia, unspecified: Secondary | ICD-10-CM

## 2011-04-21 DIAGNOSIS — K1231 Oral mucositis (ulcerative) due to antineoplastic therapy: Secondary | ICD-10-CM

## 2011-04-21 DIAGNOSIS — I10 Essential (primary) hypertension: Secondary | ICD-10-CM

## 2011-04-21 LAB — CBC WITH DIFFERENTIAL/PLATELET
Basophils Absolute: 0 10*3/uL (ref 0.0–0.1)
Eosinophils Absolute: 0.1 10*3/uL (ref 0.0–0.5)
LYMPH%: 13.8 % — ABNORMAL LOW (ref 14.0–49.7)
MCV: 93.4 fL (ref 79.5–101.0)
MONO%: 7.4 % (ref 0.0–14.0)
NEUT#: 2.6 10*3/uL (ref 1.5–6.5)
Platelets: 263 10*3/uL (ref 145–400)
RBC: 3.41 10*6/uL — ABNORMAL LOW (ref 3.70–5.45)

## 2011-04-21 LAB — BASIC METABOLIC PANEL
BUN: 16 mg/dL (ref 6–23)
Calcium: 9.2 mg/dL (ref 8.4–10.5)
Glucose, Bld: 84 mg/dL (ref 70–99)
Sodium: 142 mEq/L (ref 135–145)

## 2011-04-21 NOTE — Progress Notes (Signed)
[name] OFFICE PROGRESS NOTE  Deborah Phi, MD [address] [place] [address]   CC: Deborah [name]. Deborah Kennedy, MD Deborah [name], M.D., Ph.D.   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  PAST THERAPY:  total laryngectomy and bilateral neck dissection on 07/31/2010.  She received adjuvant chemoXRT with daily XRT and weekly cisplatin between 09/03/2010 and 10/01/2010.  Chemotherapy ended two weeks prior to XRT due to poor toleration and dose limiting toxicity.  Radiation concluded on 10/16/2010.  CURRENT THERAPY:  watchful observation.    INTERVAL HISTORY: Deborah [name] 55 y.o. female returns for   MEDICAL HISTORY: Past Medical History  Diagnosis Date  . Hypertension   . Tracheostomy dependent   . Gastrostomy feeding   . History of laryngectomy   . Bronchitis   . 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  [name] Heart murmur     asymptomatic   . Leukocytopenia     ALLERGIES:   has no known allergies.  MEDICATIONS: Current Outpatient Prescriptions  Medication Sig Dispense Refill  . amoxicillin-clavulanate (AUGMENTIN) 875-125 MG per tablet Take 1 tablet by mouth 2 (two) times daily. Times 10 days       . buPROPion (WELLBUTRIN SR) 150 MG 12 hr tablet Take 150 mg by mouth 2 (two) times daily.        [name] HYDROcodone-acetaminophen (LORTAB) 7.5-500 MG/15ML solution Take 15 mLs by mouth every 6 (six) hours as needed.        [name] levothyroxine (SYNTHROID, LEVOTHROID) 88 MCG tablet Take 1 tablet (88 mcg total) by mouth daily.  30 tablet  1  . lisinopril (PRINIVIL,ZESTRIL) 10 MG tablet Take 1 tablet (10 mg total) by mouth daily.  90 tablet  1  . metoprolol tartrate (LOPRESSOR) 25 MG tablet Take 1 tablet (25 mg total) by mouth 2 (two) times daily.  180 tablet  1  . prochlorperazine  (COMPAZINE) 10 MG tablet Take 10 mg by mouth daily as needed.        . nicotine (NICODERM CQ - DOSED IN MG/24 HOURS) 14 mg/24hr patch Place 1 patch onto the skin daily.        [name] tobramycin-dexamethasone (TOBRADEX) ophthalmic solution Place 4 drops into both eyes daily.          SURGICAL HISTORY:  Past Surgical History  Procedure Date  . Portacath placement 09/17/10    Tip in cavoatrial junction    REVIEW OF SYSTEMS:  Pertinent items are noted in HPI.   PHYSICAL EXAMINATION: ECOG PERFORMANCE STATUS: 0-1  Filed Vitals:   04/21/11 1010  BP: 115/79  Pulse: 91  Temp: 98 F (36.7 C)    General:  well-nourished in no acute distress.  Eyes:  no scleral icterus.  ENT:  There were no oropharyngeal lesions. Trach stoma in place with good granulation tissue without erythema, purulent discharge.    Neck was without thyromegaly.  Lymphatics:  Negative cervical, supraclavicular or axillary adenopathy.  Respiratory: lungs were clear bilaterally without wheezing or crackles.  Cardiovascular:  Regular rate and rhythm, S1/S2, without murmur, rub or gallop.  There was no pedal edema.  GI:  abdomen was soft, flat, nontender, nondistended, without organomegaly.  Muscoloskeletal:  no spinal tenderness of palpation of vertebral spine.  Skin exam was without echymosis, petichae.  Neuro exam was nonfocal.  Patient was able to get on and  off exam table without assistance.  Gait was normal.  Patient was alerted and oriented.  Attention was good.   Language was appropriate.  Mood was normal without depression.  Speech was not pressured.  Thought content was not tangential.     LABORATORY DATA: WBC:  3.4; ANC 2.6; hgb 10.7; Plt 263 Cr 1.01; Ca 9.2  A&P:   1. History of larynx cancer:  She is status post laryngectomy, status post adjuvant chemoradiation therapy.  There is no evidence of disease recurrence or metastatic disease today on clinical history, physical exam, laboratory tests, imaging modality.  I  advised Ms. Bossman that we will see her in 3 months myself with CT of the neck the day prior.  2. Leukocytopenia and anemia secondary to most likely inflammation from recent chemoradiation therapy.  There is no active bleeding.  There is no need for transfusion.  Her CBC have improved compared to 3 months ago. I have low clinical  suspicions for primary bone marrow problem. We will continue to observe her CBC at this time. 3. History of smoking:  She is on very low-dose nicotine patch. She is adamantly denies smoking at this time. 4. Hypertension:  Well controlled on lisinopril, metoprolol per PCP. 5. Hypothyroidism:  Due to past radiation.  She is on levothyroxine.  She is clinically euthyroid.  I will check her TSH with next visit.  6. Primary Care:  She did not have routine primary care prior to diagnosis of laryngeal cancer.  She now follows up with [facility]. She has not had a routine colonoscopy screening. She would like to do this in the future because she is not mentally ready for this. She claims that she had a screening mammogram last year that was negative.

## 2011-04-22 ENCOUNTER — Telehealth: Payer: Self-pay | Admitting: Oncology

## 2011-04-22 NOTE — Telephone Encounter (Signed)
lmonvm advising the pt of her lab appt on 07/24/2011 same day as the ct scan appt.

## 2011-05-19 ENCOUNTER — Other Ambulatory Visit: Payer: Self-pay | Admitting: Oncology

## 2011-05-19 ENCOUNTER — Other Ambulatory Visit: Payer: Self-pay | Admitting: Family Medicine

## 2011-05-19 DIAGNOSIS — Z87891 Personal history of nicotine dependence: Secondary | ICD-10-CM

## 2011-05-19 NOTE — Telephone Encounter (Signed)
Pt walked into office needing refill on Generic Wellbutrin, told pt she must have pharmacy send the request, she says the pharmacy told her MD must send in refill.

## 2011-05-19 NOTE — Telephone Encounter (Signed)
Patient's Wellbutrin was filled today by Dr. [name] (her Oncologist).  Please let patient know.

## 2011-05-20 NOTE — Telephone Encounter (Signed)
Patient informed. 

## 2011-05-28 ENCOUNTER — Ambulatory Visit (HOSPITAL_COMMUNITY): Payer: Self-pay | Admitting: Dentistry

## 2011-05-28 VITALS — BP 114/78 | HR 76 | Temp 97.5°F

## 2011-05-28 DIAGNOSIS — K1370 Unspecified lesions of oral mucosa: Secondary | ICD-10-CM

## 2011-05-28 DIAGNOSIS — K117 Disturbances of salivary secretion: Secondary | ICD-10-CM

## 2011-05-28 NOTE — Progress Notes (Signed)
Wednesday, May 28, 2011   BP: 114/78           P: 76            T: 97.5  Deborah [name] presents for evaluation of dentures. Subjective: Patient is complaining of irritation to the mandibular retromolar pad areas.   Exam: No signs of denture irritation or erythema.  Procedure: Pressure indicating paste applied to upper and lower dentures.  Adjustments made as needed. [place]. Occlusion evaluated and adjustments made as needed for centric relation and protrusive strokes. Pt. is chewing on anterior teeth as before. Patient again instructed on proper chewing method.  Pt. accepts results. Pt. to keep dentures out if sore spots develop. Use salt water rinses as needed to aid healing. RTC as scheduled for denture adjustment. Call if problems arise before then. Pt. dismissed in stable condition. Dr. Kristin Bruins

## 2011-06-19 ENCOUNTER — Encounter: Payer: Self-pay | Admitting: Oncology

## 2011-06-19 NOTE — Progress Notes (Signed)
Patient received one prescription from [facility] on 06/16/11 $3.00,her remaning balance [facility] $47.24

## 2011-06-28 ENCOUNTER — Telehealth: Payer: Self-pay | Admitting: Oncology

## 2011-06-29 DIAGNOSIS — R569 Unspecified convulsions: Secondary | ICD-10-CM

## 2011-06-29 HISTORY — DX: Unspecified convulsions: R56.9

## 2011-07-10 ENCOUNTER — Other Ambulatory Visit: Payer: Self-pay | Admitting: Family Medicine

## 2011-07-10 MED ORDER — LEVOTHYROXINE SODIUM 88 MCG PO TABS: 88.0000 ug | ORAL_TABLET | Freq: Every day | ORAL | Status: DC

## 2011-07-10 NOTE — Telephone Encounter (Signed)
Called number provided. Told recipient (I assume pt's daughter but did not confirm this) that levothyroxine refill was received and sent in. Recipient gave me a number to reach pt [phone] but I was not able to leave VM at # provided.

## 2011-07-13 ENCOUNTER — Inpatient Hospital Stay (HOSPITAL_COMMUNITY)
Admission: EM | Admit: 2011-07-13 | Discharge: 2011-07-17 | DRG: 167 | Disposition: A | Discharge status: Home / Self Care | Payer: Medicaid Other | Source: Ambulatory Visit | Attending: Family Medicine | Admitting: Family Medicine

## 2011-07-13 ENCOUNTER — Emergency Department (HOSPITAL_COMMUNITY): Payer: Medicaid Other

## 2011-07-13 ENCOUNTER — Encounter (HOSPITAL_COMMUNITY): Payer: Self-pay | Admitting: *Deleted

## 2011-07-13 DIAGNOSIS — E1159 Type 2 diabetes mellitus with other circulatory complications: Secondary | ICD-10-CM | POA: Insufficient documentation

## 2011-07-13 DIAGNOSIS — J988 Other specified respiratory disorders: Secondary | ICD-10-CM

## 2011-07-13 DIAGNOSIS — Y833 Surgical operation with formation of external stoma as the cause of abnormal reaction of the patient, or of later complication, without mention of misadventure at the time of the procedure: Secondary | ICD-10-CM | POA: Diagnosis present

## 2011-07-13 DIAGNOSIS — T50995A Adverse effect of other drugs, medicaments and biological substances, initial encounter: Secondary | ICD-10-CM | POA: Diagnosis present

## 2011-07-13 DIAGNOSIS — Z9089 Acquired absence of other organs: Secondary | ICD-10-CM

## 2011-07-13 DIAGNOSIS — J9509 Other tracheostomy complication: Secondary | ICD-10-CM | POA: Diagnosis present

## 2011-07-13 DIAGNOSIS — F17200 Nicotine dependence, unspecified, uncomplicated: Secondary | ICD-10-CM | POA: Diagnosis present

## 2011-07-13 DIAGNOSIS — Z79899 Other long term (current) drug therapy: Secondary | ICD-10-CM

## 2011-07-13 DIAGNOSIS — Z87891 Personal history of nicotine dependence: Secondary | ICD-10-CM

## 2011-07-13 DIAGNOSIS — R0602 Shortness of breath: Secondary | ICD-10-CM

## 2011-07-13 DIAGNOSIS — Z8521 Personal history of malignant neoplasm of larynx: Secondary | ICD-10-CM

## 2011-07-13 DIAGNOSIS — C329 Malignant neoplasm of larynx, unspecified: Secondary | ICD-10-CM

## 2011-07-13 DIAGNOSIS — J0410 Acute tracheitis without obstruction: Principal | ICD-10-CM

## 2011-07-13 DIAGNOSIS — Z23 Encounter for immunization: Secondary | ICD-10-CM

## 2011-07-13 DIAGNOSIS — I10 Essential (primary) hypertension: Secondary | ICD-10-CM | POA: Diagnosis present

## 2011-07-13 DIAGNOSIS — G40909 Epilepsy, unspecified, not intractable, without status epilepticus: Secondary | ICD-10-CM | POA: Diagnosis present

## 2011-07-13 DIAGNOSIS — E039 Hypothyroidism, unspecified: Secondary | ICD-10-CM | POA: Diagnosis present

## 2011-07-13 DIAGNOSIS — J45909 Unspecified asthma, uncomplicated: Secondary | ICD-10-CM | POA: Diagnosis present

## 2011-07-13 DIAGNOSIS — R569 Unspecified convulsions: Secondary | ICD-10-CM

## 2011-07-13 DIAGNOSIS — I152 Hypertension secondary to endocrine disorders: Secondary | ICD-10-CM | POA: Insufficient documentation

## 2011-07-13 DIAGNOSIS — K121 Other forms of stomatitis: Secondary | ICD-10-CM | POA: Diagnosis present

## 2011-07-13 LAB — CBC
MCH: 29.7 pg (ref 26.0–34.0)
MCV: 89.8 fL (ref 78.0–100.0)
Platelets: 308 10*3/uL (ref 150–400)
RDW: 12.5 % (ref 11.5–15.5)

## 2011-07-13 LAB — BLOOD GAS, ARTERIAL
Acid-Base Excess: 0.5 mmol/L (ref 0.0–2.0)
Drawn by: 317871
FIO2: 0.35 %
O2 Saturation: 96.2 %

## 2011-07-13 LAB — DIFFERENTIAL
Basophils Absolute: 0 10*3/uL (ref 0.0–0.1)
Eosinophils Absolute: 0.3 10*3/uL (ref 0.0–0.7)
Eosinophils Relative: 8 % — ABNORMAL HIGH (ref 0–5)
Lymphs Abs: 0.8 10*3/uL (ref 0.7–4.0)

## 2011-07-13 LAB — COMPREHENSIVE METABOLIC PANEL
ALT: 6 U/L (ref 0–35)
Calcium: 10 mg/dL (ref 8.4–10.5)
GFR calc Af Amer: 74 mL/min — ABNORMAL LOW (ref >90)
Glucose, Bld: 92 mg/dL (ref 70–99)
Sodium: 140 mEq/L (ref 135–145)
Total Protein: 9 g/dL — ABNORMAL HIGH (ref 6.0–8.3)

## 2011-07-13 LAB — POCT I-STAT TROPONIN I

## 2011-07-13 MED ORDER — ALBUTEROL SULFATE (5 MG/ML) 0.5% IN NEBU: 2.5000 mg | INHALATION_SOLUTION | RESPIRATORY_TRACT | Status: AC | PRN

## 2011-07-13 NOTE — ED Notes (Signed)
Dr. Beaton at bedside with patient.   

## 2011-07-13 NOTE — Progress Notes (Signed)
Stoma pt/hx laryngeal CA. Arrived via EMS on a NRB, per EMT pt had a mucus plug and had gone into distress. Spoke with patient and daughter, patient wears humidification at home and it broke so has gone without causing difficulty with expectorating sputum. Placed pt on a 35% ATC, spo2 100%, vitals stable. pt has good strong cough, bbs dim/cl. RT will monitor and assess as needed.

## 2011-07-13 NOTE — ED Notes (Signed)
Patient brought in from home by EMS. PER EMS, upon arrival to house pt. Was unresponsive and apneic. EMS assisted ventilations with BVM for approx. 5 minutes before patient regained consciousness. Patient placed on non-rebreather with O2 sats at 98-100% en route. Patient has trach in place. Per patient/family member, reports patient does not suction airway, but uses humidified air to cough up sputum. Patient currently on non-rebreather, maintaining airway and secretions, respirations even and unlabored.

## 2011-07-13 NOTE — ED Provider Notes (Signed)
History     CSN: [id number]  Arrival date & time 07/13/11  2106   First MD Initiated Contact with Patient 07/13/11 2213      Chief Complaint  Patient presents with  . Shortness of Breath     HPI Patient brought in from home by EMS. PER EMS, upon arrival to house pt. Was unresponsive and apneic. EMS assisted ventilations with BVM for approx. 5 minutes before patient regained consciousness. Patient placed on non-rebreather with O2 sats at 98-100% en route. Patient has trach in place. Per patient/family member, reports patient does not suction airway, but uses humidified air to cough up sputum. Patient currently on non-rebreather, maintaining airway and secretions, respirations even and unlabored.  Past Medical History  Diagnosis Date  . Hypertension   . Tracheostomy dependent   . Gastrostomy feeding   . History of laryngectomy   . Bronchitis   . 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  [name] Heart murmur     asymptomatic   . Leukocytopenia     Past Surgical History  Procedure Date  . Portacath placement 09/17/10    Tip in cavoatrial junction    Family History  Problem Relation Age of Onset  . Heart disease Mother   . Heart disease Father   . Heart disease Sister     History  Substance Use Topics  . Smoking status: Former Smoker    Types: Cigarettes    Quit date: 09/17/2010  . Smokeless tobacco: Not on file  . Alcohol Use: No     Former drinker 6 months ago    OB History    Grav Para Term Preterm Abortions TAB SAB Ect Mult Living                  Review of Systems Remaining review of systems negative except as noted history of present illness Allergies  Review of patient's allergies indicates no known allergies.  Home Medications   Current Outpatient Rx  Name Route Sig Dispense Refill  . BUPROPION HCL ER (XL) 150 MG PO TB24  TAKE ONE TABLET BY MOUTH EVERY DAY 90 tablet 3  . HYDROCODONE-ACETAMINOPHEN 7.5-500  MG/15ML PO SOLN Oral Take 15 mLs by mouth every 6 (six) hours as needed. For pain    . LEVOTHYROXINE SODIUM 88 MCG PO TABS Oral Take 1 tablet (88 mcg total) by mouth daily. 60 tablet 0  . LISINOPRIL 10 MG PO TABS Oral Take 1 tablet (10 mg total) by mouth daily. 90 tablet 1  . METOPROLOL TARTRATE 25 MG PO TABS Oral Take 1 tablet (25 mg total) by mouth 2 (two) times daily. 180 tablet 1  . NICOTINE 14 MG/24HR TD PT24 Transdermal Place 1 patch onto the skin daily.      [name] PROCHLORPERAZINE MALEATE 10 MG PO TABS Oral Take 10 mg by mouth daily as needed. For nausea    . TOBRAMYCIN-DEXAMETHASONE 0.3-0.1 % OP SUSP Both Eyes Place 4 drops into both eyes daily.        BP 170/98  Pulse 78  Resp 18  SpO2 100%  Physical Exam  Nursing note and vitals reviewed. Constitutional: She is oriented to person, place, and time. She appears well-developed and well-nourished. No distress.       Patient was tracheostomy.  HENT:  Head: Normocephalic and atraumatic.  Eyes: Pupils are equal, round, and reactive to light.  Neck: Normal range of motion.       Tracheostomy stoma  in place.  Cardiovascular: Normal rate and intact distal pulses.   Pulmonary/Chest: Effort normal. No stridor. No respiratory distress.       Coarse rhonchi bilaterally  Abdominal: Normal appearance. She exhibits no distension.  Musculoskeletal: Normal range of motion.  Neurological: She is alert and oriented to person, place, and time. No cranial nerve deficit.  Skin: Skin is warm and dry. No rash noted.  Psychiatric: She has a normal mood and affect. Her behavior is normal.    ED Course  Procedures (including critical care time)  Labs Reviewed  BLOOD GAS, ARTERIAL - Abnormal; Notable for the following:    pCO2 arterial 46.4 (*)    Bicarbonate 25.7 (*)    All other components within normal limits  CBC - Abnormal; Notable for the following:    WBC 3.3 (*)    Hemoglobin 11.9 (*)    All other components within normal limits    DIFFERENTIAL - Abnormal; Notable for the following:    Eosinophils Relative 8 (*)    All other components within normal limits  POCT I-STAT TROPONIN I  COMPREHENSIVE METABOLIC PANEL  I-STAT TROPONIN I   Dg Chest Portable 1 View  07/13/2011  *RADIOLOGY REPORT*  Clinical Data: Shortness of breath, weakness, history hypertension, smoking  PORTABLE CHEST - 1 VIEW  Comparison: Portable exam 2247 hours compared to 04/16/2011  Findings: Right side power port stable, tip projecting over SVC. Upper-normal size of cardiac silhouette. Mediastinal contours and pulmonary vascularity normal. Chronic right basilar atelectasis versus scarring. External artifacts project over lower right chest. No pulmonary infiltrate, pleural effusion or pneumothorax. No acute osseous findings.  IMPRESSION: Chronic right basilar atelectasis versus scarring.  Original Report Authenticated By: Lollie Marrow, M.D.     1. Airway obstruction   2. Laryngeal cancer       MDM   Family practice notified.  Accepted patient for transfer.  We'll admit to telemetry floor for pulmonary toilet.       Nelia Shi, MD 07/13/11 [phone]

## 2011-07-13 NOTE — ED Notes (Signed)
BJY:[id number]<FA> Expected date:07/13/11<BR> Expected time: 8:53 PM<BR> Means of arrival:Ambulance<BR> Comments:<BR> EMS 262 GC, 52 yof needs stoma suctioning

## 2011-07-13 NOTE — ED Notes (Signed)
IV therapy paged to obtain port access.

## 2011-07-13 NOTE — ED Notes (Signed)
Carol from IV Therapy to bedside for port access.

## 2011-07-14 ENCOUNTER — Encounter (HOSPITAL_COMMUNITY): Payer: Self-pay | Admitting: Family

## 2011-07-14 ENCOUNTER — Inpatient Hospital Stay (HOSPITAL_COMMUNITY): Payer: Medicaid Other

## 2011-07-14 ENCOUNTER — Emergency Department (HOSPITAL_COMMUNITY): Payer: Medicaid Other

## 2011-07-14 ENCOUNTER — Other Ambulatory Visit (HOSPITAL_COMMUNITY): Payer: Medicaid Other

## 2011-07-14 DIAGNOSIS — IMO0001 Reserved for inherently not codable concepts without codable children: Secondary | ICD-10-CM

## 2011-07-14 HISTORY — DX: Reserved for inherently not codable concepts without codable children: IMO0001

## 2011-07-14 LAB — BASIC METABOLIC PANEL
Chloride: 103 mEq/L (ref 96–112)
GFR calc Af Amer: 73 mL/min — ABNORMAL LOW (ref >90)
GFR calc non Af Amer: 63 mL/min — ABNORMAL LOW (ref >90)
Potassium: 4.1 mEq/L (ref 3.5–5.1)
Sodium: 142 mEq/L (ref 135–145)

## 2011-07-14 LAB — MRSA PCR SCREENING: MRSA by PCR: NEGATIVE

## 2011-07-14 LAB — CBC
HCT: 34 % — ABNORMAL LOW (ref 36.0–46.0)
Hemoglobin: 11.2 g/dL — ABNORMAL LOW (ref 12.0–15.0)
RBC: 3.77 MIL/uL — ABNORMAL LOW (ref 3.87–5.11)
WBC: 3 10*3/uL — ABNORMAL LOW (ref 4.0–10.5)

## 2011-07-14 MED ORDER — IOHEXOL 300 MG/ML  SOLN
75.0000 mL | Freq: Once | INTRAMUSCULAR | Status: AC | PRN
  Administered 2011-07-14: 75 mL via INTRAVENOUS

## 2011-07-14 MED ORDER — HYDROCODONE-ACETAMINOPHEN 5-325 MG PO TABS
1.0000 | ORAL_TABLET | ORAL | Status: DC | PRN
  Administered 2011-07-14 – 2011-07-17 (×12): 2 via ORAL
  Filled 2011-07-14 (×5): qty 2
  Filled 2011-07-14: qty 1
  Filled 2011-07-14 (×4): qty 2
  Filled 2011-07-14: qty 1
  Filled 2011-07-14 (×2): qty 2

## 2011-07-14 MED ORDER — LORAZEPAM 2 MG/ML IJ SOLN
1.0000 mg | Freq: Once | INTRAMUSCULAR | Status: AC
  Administered 2011-07-14: 1 mg via INTRAVENOUS

## 2011-07-14 MED ORDER — SODIUM CHLORIDE 0.9 % IV SOLN: 250.0000 mL | INTRAVENOUS | Status: DC | PRN

## 2011-07-14 MED ORDER — LEVOTHYROXINE SODIUM 88 MCG PO TABS
88.0000 ug | ORAL_TABLET | Freq: Every day | ORAL | Status: DC
  Administered 2011-07-14 – 2011-07-15 (×2): 88 ug via ORAL
  Filled 2011-07-14 (×3): qty 1

## 2011-07-14 MED ORDER — ENOXAPARIN SODIUM 40 MG/0.4ML ~~LOC~~ SOLN
40.0000 mg | SUBCUTANEOUS | Status: DC
  Administered 2011-07-14 – 2011-07-17 (×4): 40 mg via SUBCUTANEOUS
  Filled 2011-07-14 (×4): qty 0.4

## 2011-07-14 MED ORDER — SODIUM CHLORIDE 0.9 % IJ SOLN
10.0000 mL | INTRAMUSCULAR | Status: DC | PRN
  Administered 2011-07-16 – 2011-07-17 (×2): 10 mL

## 2011-07-14 MED ORDER — SODIUM CHLORIDE 0.9 % IJ SOLN
3.0000 mL | Freq: Two times a day (BID) | INTRAMUSCULAR | Status: DC
  Administered 2011-07-14 – 2011-07-15 (×4): 3 mL via INTRAVENOUS

## 2011-07-14 MED ORDER — BUPROPION HCL ER (XL) 150 MG PO TB24
150.0000 mg | ORAL_TABLET | Freq: Every day | ORAL | Status: DC
  Administered 2011-07-14: 150 mg via ORAL
  Filled 2011-07-14: qty 1

## 2011-07-14 MED ORDER — PNEUMOCOCCAL VAC POLYVALENT 25 MCG/0.5ML IJ INJ
0.5000 mL | INJECTION | INTRAMUSCULAR | Status: AC
  Administered 2011-07-15: 0.5 mL via INTRAMUSCULAR
  Filled 2011-07-14: qty 0.5

## 2011-07-14 MED ORDER — GUAIFENESIN ER 600 MG PO TB12
600.0000 mg | ORAL_TABLET | Freq: Two times a day (BID) | ORAL | Status: DC
  Administered 2011-07-14 (×2): 600 mg via ORAL
  Filled 2011-07-14 (×8): qty 1

## 2011-07-14 MED ORDER — TOBRAMYCIN-DEXAMETHASONE 0.3-0.1 % OP SUSP
4.0000 [drp] | Freq: Every day | OPHTHALMIC | Status: DC
  Administered 2011-07-14 – 2011-07-17 (×4): 4 [drp] via OPHTHALMIC
  Filled 2011-07-14 (×2): qty 2.5

## 2011-07-14 MED ORDER — SODIUM CHLORIDE 0.9 % IJ SOLN
3.0000 mL | INTRAMUSCULAR | Status: DC | PRN
  Administered 2011-07-14: 3 mL via INTRAVENOUS

## 2011-07-14 MED ORDER — LIDOCAINE VISCOUS 2 % MT SOLN
20.0000 mL | Freq: Once | OROMUCOSAL | Status: AC
  Administered 2011-07-14: 15 mL via OROMUCOSAL
  Filled 2011-07-14: qty 20

## 2011-07-14 MED ORDER — WHITE PETROLATUM GEL
Status: AC
  Administered 2011-07-14: 11:00:00
  Filled 2011-07-14: qty 5

## 2011-07-14 MED ORDER — GADOBENATE DIMEGLUMINE 529 MG/ML IV SOLN
15.0000 mL | Freq: Once | INTRAVENOUS | Status: AC | PRN
  Administered 2011-07-14: 15 mL via INTRAVENOUS

## 2011-07-14 MED ORDER — LISINOPRIL 10 MG PO TABS
10.0000 mg | ORAL_TABLET | Freq: Every day | ORAL | Status: DC
  Administered 2011-07-14 – 2011-07-17 (×4): 10 mg via ORAL
  Filled 2011-07-14 (×4): qty 1

## 2011-07-14 MED ORDER — LORAZEPAM 2 MG/ML IJ SOLN
INTRAMUSCULAR | Status: AC
  Filled 2011-07-14: qty 1

## 2011-07-14 MED ORDER — METOPROLOL TARTRATE 25 MG PO TABS
25.0000 mg | ORAL_TABLET | Freq: Two times a day (BID) | ORAL | Status: DC
  Administered 2011-07-14 – 2011-07-17 (×6): 25 mg via ORAL
  Filled 2011-07-14 (×8): qty 1

## 2011-07-14 MED ORDER — NICOTINE 14 MG/24HR TD PT24
14.0000 mg | MEDICATED_PATCH | TRANSDERMAL | Status: DC
  Administered 2011-07-14 – 2011-07-17 (×4): 14 mg via TRANSDERMAL
  Filled 2011-07-14 (×6): qty 1

## 2011-07-14 NOTE — ED Notes (Signed)
Patient began smacking lips and became unresponsive for approx. 2 minutes.  Airway maintained during this time; vital signs as charted. No prior hx of seizures. Dr. Lynelle Doctor notified and aware. Orders received. Will continue to monitor.

## 2011-07-14 NOTE — Consult Note (Signed)
Reason for Consult: Problems with tracheal stoma Referring Physician: Hospitalists  Deborah [name] is an 56 y.o. female.  HPI: patient well known to me, status post total laryngectomy, has had severe radiation-induced tracheitis since postoperation. She was admitted late last night, complaining of shortness of breath. She normally puts her stoma vent and before bedtime and takes out in the morning, and leaves it out all day. She was unable to get it in last night. It is hurting.  Past Medical History  Diagnosis Date  . Hypertension   . Tracheostomy dependent   . Gastrostomy feeding   . History of laryngectomy   . Bronchitis   . 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  [name] Heart murmur     asymptomatic   . Leukocytopenia   . Asthma     Past Surgical History  Procedure Date  . Portacath placement 09/17/10    Tip in cavoatrial junction  . Laryngectomy     Family History  Problem Relation Age of Onset  . Heart disease Mother   . Heart disease Father   . Heart disease Sister     Social History:  reports that she quit smoking about 9 months ago. Her smoking use included Cigarettes. She does not have any smokeless tobacco history on file. She reports that she does not drink alcohol or use illicit drugs.  Allergies: No Known Allergies  Medications: I have reviewed the patient's current medications.  Results for orders placed during the hospital encounter of 07/13/11 (from the past 48 hour(s))  BLOOD GAS, ARTERIAL     Status: Abnormal   Collection Time   07/13/11 10:37 PM      Component Value Range Comment   FIO2 0.35      Delivery systems  AEROSOL TRACH COLLAR      pH, Arterial 7.362  7.350 - 7.400     pCO2 arterial 46.4 (*) 35.0 - 45.0 (mmHg)    pO2, Arterial 93.0  80.0 - 100.0 (mmHg)    Bicarbonate 25.7 (*) 20.0 - 24.0 (mEq/L)    TCO2 23.6  0 - 100 (mmol/L)    Acid-Base Excess 0.5  0.0 - 2.0 (mmol/L)    O2 Saturation 96.2       Patient temperature 98.6      Collection site RIGHT RADIAL      Drawn by [id number]      Sample type ARTERIAL DRAW      Allens test (pass/fail) PASS  PASS    CBC     Status: Abnormal   Collection Time   07/13/11 11:05 PM      Component Value Range Comment   WBC 3.3 (*) 4.0 - 10.5 (K/uL)    RBC 4.01  3.87 - 5.11 (MIL/uL)    Hemoglobin 11.9 (*) 12.0 - 15.0 (g/dL)    HCT 04.5  40.9 - 81.1 (%)    MCV 89.8  78.0 - 100.0 (fL)    MCH 29.7  26.0 - 34.0 (pg)    MCHC 33.1  30.0 - 36.0 (g/dL)    RDW 91.4  78.2 - 95.6 (%)    Platelets 308  150 - 400 (K/uL)   DIFFERENTIAL     Status: Abnormal   Collection Time   07/13/11 11:05 PM      Component Value Range Comment   Neutrophils Relative 57  43 - 77 (%)    Neutro Abs 1.9  1.7 - 7.7 (K/uL)    Lymphocytes  Relative 25  12 - 46 (%)    Lymphs Abs 0.8  0.7 - 4.0 (K/uL)    Monocytes Relative 10  3 - 12 (%)    Monocytes Absolute 0.3  0.1 - 1.0 (K/uL)    Eosinophils Relative 8 (*) 0 - 5 (%)    Eosinophils Absolute 0.3  0.0 - 0.7 (K/uL)    Basophils Relative 1  0 - 1 (%)    Basophils Absolute 0.0  0.0 - 0.1 (K/uL)   COMPREHENSIVE METABOLIC PANEL     Status: Abnormal   Collection Time   07/13/11 11:05 PM      Component Value Range Comment   Sodium 140  135 - 145 (mEq/L)    Potassium 3.6  3.5 - 5.1 (mEq/L)    Chloride 102  96 - 112 (mEq/L)    CO2 28  19 - 32 (mEq/L)    Glucose, Bld 92  70 - 99 (mg/dL)    BUN 10  6 - 23 (mg/dL)    Creatinine, Ser 1.61  0.50 - 1.10 (mg/dL)    Calcium 09.6  8.4 - 10.5 (mg/dL)    Total Protein 9.0 (*) 6.0 - 8.3 (g/dL)    Albumin 3.9  3.5 - 5.2 (g/dL)    AST 13  0 - 37 (U/L)    ALT 6  0 - 35 (U/L)    Alkaline Phosphatase 81  39 - 117 (U/L)    Total Bilirubin 0.2 (*) 0.3 - 1.2 (mg/dL)    GFR calc non Af Amer 64 (*) >90 (mL/min)    GFR calc Af Amer 74 (*) >90 (mL/min)   POCT I-STAT TROPONIN I     Status: Normal   Collection Time   07/13/11 11:15 PM      Component Value Range Comment   Troponin i, poc 0.00  0.00 -  0.08 (ng/mL)    Comment 3            MRSA PCR SCREENING     Status: Normal   Collection Time   07/14/11  3:20 AM      Component Value Range Comment   MRSA by PCR NEGATIVE  NEGATIVE    BASIC METABOLIC PANEL     Status: Abnormal   Collection Time   07/14/11  5:25 AM      Component Value Range Comment   Sodium 142  135 - 145 (mEq/L)    Potassium 4.1  3.5 - 5.1 (mEq/L)    Chloride 103  96 - 112 (mEq/L)    CO2 31  19 - 32 (mEq/L)    Glucose, Bld 90  70 - 99 (mg/dL)    BUN 9  6 - 23 (mg/dL)    Creatinine, Ser 0.45  0.50 - 1.10 (mg/dL)    Calcium 9.9  8.4 - 10.5 (mg/dL)    GFR calc non Af Amer 63 (*) >90 (mL/min)    GFR calc Af Amer 73 (*) >90 (mL/min)   CBC     Status: Abnormal   Collection Time   07/14/11  5:25 AM      Component Value Range Comment   WBC 3.0 (*) 4.0 - 10.5 (K/uL)    RBC 3.77 (*) 3.87 - 5.11 (MIL/uL)    Hemoglobin 11.2 (*) 12.0 - 15.0 (g/dL)    HCT 40.9 (*) 81.1 - 46.0 (%)    MCV 90.2  78.0 - 100.0 (fL)    MCH 29.7  26.0 - 34.0 (pg)    MCHC  32.9  30.0 - 36.0 (g/dL)    RDW 16.1  09.6 - 04.5 (%)    Platelets 280  150 - 400 (K/uL)     Ct Head Wo Contrast  07/14/2011  *RADIOLOGY REPORT*  Clinical Data: The the patient reports episode of unresponsiveness. History of laryngeal cancer post surgery, chemotherapy, and radiation therapy.  CT HEAD WITHOUT CONTRAST  Technique:  Contiguous axial images were obtained from the base of the skull through the vertex without contrast.  Comparison: 11/18/2010  Findings: The ventricles and sulci appear symmetrical.  No mass effect or midline shift.  No abnormal extra-axial fluid collections.  No ventricular dilatation.  Gray-white matter junctions are distinct.  Basal cisterns are not effaced.  No evidence of acute intracranial hemorrhage.  No depressed skull fractures.  Opacification of the left maxillary antrum with mucous membrane thickening in the right maxillary antrum, demonstrating progression since the previous study.  Otherwise, no  change.  IMPRESSION: No evidence of acute intracranial hemorrhage, mass lesion, or acute infarct.  Note that contrast enhanced MRI would be more sensitive for detection of metastasis if this is clinically considered.  Original Report Authenticated By: Marlon Pel, M.D.   Ct Soft Tissue Neck W Contrast  07/14/2011  *RADIOLOGY REPORT*  Clinical Data: Laryngeal cancer.  Post surgery, chemotherapy and radiation therapy.  Tracheostomy September 2012.  Recent episode of inability to clear the airway.  CT NECK WITH CONTRAST  Technique:  Multidetector CT imaging of the neck was performed with intravenous contrast.  Contrast: 75mL OMNIPAQUE IOHEXOL 300 MG/ML IV SOLN  Comparison: 01/17/2011.  Findings: Post laryngectomy and left thyroid lobe resection.  There is a radiopaque structure at the posterior margin of the lower cervical trachea imbedded in the posterior wall. Communication with the adjacent esophagus not excluded.  Clinical correlation recommended.  Diffuse thickening of the esophagus once again noted.  Mild thickening of the trachea below the tracheostomy site but without complete occlusion or coaptation membranes.  Diffuse infiltration of fat planes throughout the lower head, neck and upper chest.  Dense appearance of the submandibular glands. Findings are consistent with post radiation therapy changes with hyperenhancement of the mucosal membranes throughout the pharynx in a symmetric fashion.  Since the prior examination, there has been any this increase in amount of prevertebral and parapharyngeal fluid.  Etiology indeterminate.  Scattered lymph nodes including a small left parotid lymph nodes and sub mental lymph nodes with minimal change since prior exam (most notable involving left submental lymph node series 3 image 49 although still containing a fatty center).  Opacification maxillary sinuses greater on the left.  Cervical spondylotic changes without bony destructive lesion.  The appearance of  the left aspect of the mandible without change in this patient who is edentulous.  Aberrant right subclavian artery. Progressive apical parenchymal changes may represent result of radiation therapy.  Mediport catheter enters from the right.  The full extent is not imaged on the present exam.  IMPRESSION: Post laryngectomy and left thyroid lobe resection.  There is a radiopaque structure at the posterior margin of the lower cervical trachea imbedded in the posterior wall. Communication with the adjacent esophagus not excluded.  Clinical correlation recommended.  Diffuse thickening of the esophagus once again noted.  Mild thickening of the trachea below the tracheostomy site but without complete occlusion or coaptation membranes.  Diffuse infiltration of fat planes throughout the lower head, neck and upper chest.  Dense appearance of the submandibular glands. Findings are consistent with post  radiation therapy changes with hyperenhancement of the mucosal membranes throughout the pharynx in a symmetric fashion.  Since the prior examination, there has been any this increase in amount of prevertebral and parapharyngeal fluid.  Etiology indeterminate.  Original Report Authenticated By: Fuller Canada, M.D.   Dg Chest Port 1 View  07/14/2011  *RADIOLOGY REPORT*  Clinical Data: Chest pain, wheezing, shortness of breath  PORTABLE CHEST - 1 VIEW  Comparison: 07/13/2011  Findings: Shallow inspiration.  Normal heart size and pulmonary vascularity for technique.  Power port central venous catheter over the SVC / RA junction.  No focal airspace consolidation.  No blunting of costophrenic angles.  No pneumothorax.  Extrapulmonary densities projected over the lower chest.  No significant change since previous study.  IMPRESSION: No evidence of active pulmonary disease.  Original Report Authenticated By: Marlon Pel, M.D.   Dg Chest Portable 1 View  07/13/2011  *RADIOLOGY REPORT*  Clinical Data: Shortness of breath,  weakness, history hypertension, smoking  PORTABLE CHEST - 1 VIEW  Comparison: Portable exam 2247 hours compared to 04/16/2011  Findings: Right side power port stable, tip projecting over SVC. Upper-normal size of cardiac silhouette. Mediastinal contours and pulmonary vascularity normal. Chronic right basilar atelectasis versus scarring. External artifacts project over lower right chest. No pulmonary infiltrate, pleural effusion or pneumothorax. No acute osseous findings.  IMPRESSION: Chronic right basilar atelectasis versus scarring.  Original Report Authenticated By: Lollie Marrow, M.D.    ROS: Otherwise negative, she is able to swallow and is able to phonate well with her TEP.  Blood pressure 133/85, pulse 71, temperature 98.3 F (36.8 C), temperature source Oral, resp. rate 18, height 5\' 7"  (1.702 m), weight 72.6 kg (160 lb 0.9 oz), SpO2 100.00%.  PHYSICAL EXAM: Overall appearance:  Healthy appearing, in no distress Head:  Normocephalic, atraumatic. Neck: No palpable neck masses. Stoma is dry and crusted. I cleaned out the crests with forceps. She was able to breathe a little bit better. I attempted to replace the event, but was unable to. It is quite tender for her.  Studies Reviewed:CT scan reviewed, no evidence of obvious recurrent disease.  Assessment/Plan: Chronic radiation-induced stomatitis and tracheitis. The cold dry winter air is making things worse. She breathes a lot better after I cleaned out the crusts. I will plan to bring her to the operating room tomorrow, under anesthesia, will dilate and replace the stoma vent.  Hennie Gosa H 07/14/2011, 2:11 PM

## 2011-07-14 NOTE — Treatment Plan (Signed)
I  h ave evaluated my patient and reviewed her chart. I agree with the current management. I agree with obtaining the MRI and dilating her stoma. I have ordered thyroid function test that I have been unable to obtain in the outpatient setting due to lack of follow-up. I greatly appreciate the excellent care that the primary team and consultants have provided.  [name], M.D.  5:62 PM 06/28/11

## 2011-07-14 NOTE — ED Provider Notes (Addendum)
Called to bedside as pt had another episode of unresponsiveness.  She was being suctioned when she suddenly stopped answering questions.  At bedside pt is not hypotensive.  She is not hypoxic.  Pt is being admitted for further evaluation.  Monitor shows a normal sinus rhythm.  Pt does appear to have some tongue fasciculations.  Will recheck CXR and perform a head CT.  ?TIA ?seizure.  Pt is pending admission. Will give a dose of ativan   Celene Kras, MD 07/14/11 [id number]  Celene Kras, MD 07/14/11 [phone]

## 2011-07-14 NOTE — H&P (Signed)
[facility] Attending Admit Note  Patient seen and examined by me, discussed with admitting resident Dr. Rivka Safer and I agree with his plan.  Briefly, a 55yoF with laryngeal cancer and trach performed Sept 2012, admitted for inability to clear inspissated airway secretions at home.  She denies fevers, chills or cough; states that secretions not clearing with humidified air at home.  Furthermore she states that her stoma has become much narrower, making it more difficult for her to clear secretions.  Report from ED that she had seizure-like activity and unresponsiveness while being suctioned.  She does not recall the episode personally.  Negative CT head done afterward; she denies any prior seizure history.   PE Alert, appears comfortable.  HEENT Healed trach stoma without erythema or purulence. COR regular S1S2 PULM Clear bilaterally, no rales or wheezes No edema in lower extremities; palpable dp pulses bilaterally  Assess/Plan: 55yoF with laryngeal cancer diagnosis and trach, admitted for difficulty clearing secretions from airway.  While in ED had witnessed episode of unresponsiveness that raises concern for possible seizure; I agree with MRI brain to rule out metastatic disease.  She reports that her oncologist, Dr. Gaylyn Rong, had planned for her to have MRI neck in early February.  Plan to discuss with Dr Gaylyn Rong to order along with brain MRI today.  Pending MRI results, may decide whether EEG +/- neuro consult is needed.  Also to contact her ENT for consideration of any modifications to trach stoma. Encourage hydration; mucolytic therapy with guaifenesin.  Paula Compton, M.D.

## 2011-07-14 NOTE — H&P (Signed)
Deborah [name] is an 56 y.o. female.   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 Gaylyn Rong and surgery Dr. [name]. For the last 3 days she has had increased tracheal secretions at home. She normally has humidified air that helps her expectorate. She was not able to yesterday evening and was going into respiratory distress. Her family brought her to [facility] ED. Plan was to transfer to [facility] under Fam. Med. service, Dr. Armen Pickup PCP. Patient was reported to have two seizure-like episodes, small fasciculations, but unresponsive. Went to CT scan for Head imaging, which was negative. (rads recs. Mri to rule out metastasis).  Patient denies recent illness with the cold/flu. She says the secretions were more than usual, but normal in consistency. She denies fever, chest pain, abnormal cough, congestion, sob (other than when secretions build up).  Patient says she has had "shaking" in the past, with description of post ictal state and one episode of urinary incontinence. No formal history of seizures.  She has no neuro deficits, no hx of MI/Stroke. No known metastasis. She is to have another CT scan with oncology in one month. She also is supposed to schedule appointment with General surgeon Dr. [name] about shrinking size of her tracheostomy hole.  Past Medical History  Diagnosis Date  . Hypertension   . Tracheostomy dependent   . Gastrostomy feeding   . History of laryngectomy   . Bronchitis   . 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  [name] Heart murmur     asymptomatic   . Leukocytopenia     Past Surgical History  Procedure Date  . Portacath placement 09/17/10    Tip in cavoatrial junction  . Laryngectomy     Family History  Problem Relation Age of Onset  . Heart disease  Mother   . Heart disease Father   . Heart disease Sister    Social History:  reports that she quit smoking about 9 months ago. Her smoking use included Cigarettes. She does not have any smokeless tobacco history on file. She reports that she does not drink alcohol or use illicit drugs.  Allergies: No Known Allergies  Medications Prior to Admission  Medication Dose Route Frequency Provider Last Rate Last Dose  . albuterol (PROVENTIL) (5 MG/ML) 0.5% nebulizer solution 2.5 mg  2.5 mg Nebulization Q2H PRN Nelia Shi, MD      . LORazepam (ATIVAN) injection 1 mg  1 mg Intravenous Once Celene Kras, MD   1 mg at 07/14/11 0057  . sodium chloride 0.9 % injection 10-40 mL  10-40 mL Intracatheter PRN Nelia Shi, MD       Medications Prior to Admission  Medication Sig Dispense Refill  . buPROPion (WELLBUTRIN XL) 150 MG 24 hr tablet TAKE ONE TABLET BY MOUTH EVERY DAY  90 tablet  3  . HYDROcodone-acetaminophen (LORTAB) 7.5-500 MG/15ML solution Take 15 mLs by mouth every 6 (six) hours as needed. For pain      . levothyroxine (SYNTHROID, LEVOTHROID) 88 MCG tablet Take 1 tablet (88 mcg total) by mouth daily.  60 tablet  0  . lisinopril (PRINIVIL,ZESTRIL) 10 MG tablet Take 1 tablet (10 mg total) by mouth daily.  90 tablet  1  . metoprolol tartrate (LOPRESSOR) 25 MG tablet Take 1 tablet (25 mg total) by mouth  2 (two) times daily.  180 tablet  1  . nicotine (NICODERM CQ - DOSED IN MG/24 HOURS) 14 mg/24hr patch Place 1 patch onto the skin daily.        . prochlorperazine (COMPAZINE) 10 MG tablet Take 10 mg by mouth daily as needed. For nausea      . tobramycin-dexamethasone (TOBRADEX) ophthalmic solution Place 4 drops into both eyes daily.          Results for orders placed during the hospital encounter of 07/13/11 (from the past 48 hour(s))  BLOOD GAS, ARTERIAL     Status: Abnormal   Collection Time   07/13/11 10:37 PM      Component Value Range Comment   FIO2 0.35      Delivery systems  AEROSOL  TRACH COLLAR      pH, Arterial 7.362  7.350 - 7.400     pCO2 arterial 46.4 (*) 35.0 - 45.0 (mmHg)    pO2, Arterial 93.0  80.0 - 100.0 (mmHg)    Bicarbonate 25.7 (*) 20.0 - 24.0 (mEq/L)    TCO2 23.6  0 - 100 (mmol/L)    Acid-Base Excess 0.5  0.0 - 2.0 (mmol/L)    O2 Saturation 96.2      Patient temperature 98.6      Collection site RIGHT RADIAL      Drawn by [id number]      Sample type ARTERIAL DRAW      Allens test (pass/fail) PASS  PASS    CBC     Status: Abnormal   Collection Time   07/13/11 11:05 PM      Component Value Range Comment   WBC 3.3 (*) 4.0 - 10.5 (K/uL)    RBC 4.01  3.87 - 5.11 (MIL/uL)    Hemoglobin 11.9 (*) 12.0 - 15.0 (g/dL)    HCT 04.5  40.9 - 81.1 (%)    MCV 89.8  78.0 - 100.0 (fL)    MCH 29.7  26.0 - 34.0 (pg)    MCHC 33.1  30.0 - 36.0 (g/dL)    RDW 91.4  78.2 - 95.6 (%)    Platelets 308  150 - 400 (K/uL)   DIFFERENTIAL     Status: Abnormal   Collection Time   07/13/11 11:05 PM      Component Value Range Comment   Neutrophils Relative 57  43 - 77 (%)    Neutro Abs 1.9  1.7 - 7.7 (K/uL)    Lymphocytes Relative 25  12 - 46 (%)    Lymphs Abs 0.8  0.7 - 4.0 (K/uL)    Monocytes Relative 10  3 - 12 (%)    Monocytes Absolute 0.3  0.1 - 1.0 (K/uL)    Eosinophils Relative 8 (*) 0 - 5 (%)    Eosinophils Absolute 0.3  0.0 - 0.7 (K/uL)    Basophils Relative 1  0 - 1 (%)    Basophils Absolute 0.0  0.0 - 0.1 (K/uL)   COMPREHENSIVE METABOLIC PANEL     Status: Abnormal   Collection Time   07/13/11 11:05 PM      Component Value Range Comment   Sodium 140  135 - 145 (mEq/L)    Potassium 3.6  3.5 - 5.1 (mEq/L)    Chloride 102  96 - 112 (mEq/L)    CO2 28  19 - 32 (mEq/L)    Glucose, Bld 92  70 - 99 (mg/dL)    BUN 10  6 - 23 (mg/dL)    Creatinine,  Ser 0.99  0.50 - 1.10 (mg/dL)    Calcium 40.9  8.4 - 10.5 (mg/dL)    Total Protein 9.0 (*) 6.0 - 8.3 (g/dL)    Albumin 3.9  3.5 - 5.2 (g/dL)    AST 13  0 - 37 (U/L)    ALT 6  0 - 35 (U/L)    Alkaline Phosphatase 81  39 - 117  (U/L)    Total Bilirubin 0.2 (*) 0.3 - 1.2 (mg/dL)    GFR calc non Af Amer 64 (*) >90 (mL/min)    GFR calc Af Amer 74 (*) >90 (mL/min)   POCT I-STAT TROPONIN I     Status: Normal   Collection Time   07/13/11 11:15 PM      Component Value Range Comment   Troponin i, poc 0.00  0.00 - 0.08 (ng/mL)    Comment 3             Ct Head Wo Contrast  07/14/2011  *RADIOLOGY REPORT*  Clinical Data: The the patient reports episode of unresponsiveness. History of laryngeal cancer post surgery, chemotherapy, and radiation therapy.  CT HEAD WITHOUT CONTRAST  Technique:  Contiguous axial images were obtained from the base of the skull through the vertex without contrast.  Comparison: 11/18/2010  Findings: The ventricles and sulci appear symmetrical.  No mass effect or midline shift.  No abnormal extra-axial fluid collections.  No ventricular dilatation.  Gray-white matter junctions are distinct.  Basal cisterns are not effaced.  No evidence of acute intracranial hemorrhage.  No depressed skull fractures.  Opacification of the left maxillary antrum with mucous membrane thickening in the right maxillary antrum, demonstrating progression since the previous study.  Otherwise, no change.  IMPRESSION: No evidence of acute intracranial hemorrhage, mass lesion, or acute infarct.  Note that contrast enhanced MRI would be more sensitive for detection of metastasis if this is clinically considered.  Original Report Authenticated By: Marlon Pel, M.D.   Dg Chest Port 1 View  07/14/2011  *RADIOLOGY REPORT*  Clinical Data: Chest pain, wheezing, shortness of breath  PORTABLE CHEST - 1 VIEW  Comparison: 07/13/2011  Findings: Shallow inspiration.  Normal heart size and pulmonary vascularity for technique.  Power port central venous catheter over the SVC / RA junction.  No focal airspace consolidation.  No blunting of costophrenic angles.  No pneumothorax.  Extrapulmonary densities projected over the lower chest.  No significant  change since previous study.  IMPRESSION: No evidence of active pulmonary disease.  Original Report Authenticated By: Marlon Pel, M.D.   Dg Chest Portable 1 View  07/13/2011  *RADIOLOGY REPORT*  Clinical Data: Shortness of breath, weakness, history hypertension, smoking  PORTABLE CHEST - 1 VIEW  Comparison: Portable exam 2247 hours compared to 04/16/2011  Findings: Right side power port stable, tip projecting over SVC. Upper-normal size of cardiac silhouette. Mediastinal contours and pulmonary vascularity normal. Chronic right basilar atelectasis versus scarring. External artifacts project over lower right chest. No pulmonary infiltrate, pleural effusion or pneumothorax. No acute osseous findings.  IMPRESSION: Chronic right basilar atelectasis versus scarring.  Original Report Authenticated By: Lollie Marrow, M.D.    ROS Pertinent items are noted in HPI. No fever, chills, night sweats, weight loss.  Blood pressure 139/92, pulse 76, temperature 98.9 F (37.2 C), temperature source Oral, resp. rate 22, height 5\' 7"  (1.702 m), weight 160 lb 0.9 oz (72.6 kg), SpO2 100.00%. Physical Exam  Constitution: sleeping, supine, NAD. Conversive, but has to cover trach stoma with finger.  Throat: tracheostomy stoma without cannula - minimal yellowish mucous around neck. Lungs:  Normal respiratory effort, chest expands symmetrically. Lungs are clear to auscultation, no crackles or wheezes. Heart - Regular rate and rhythm.  No murmurs, gallops or rubs.    Abdomen: soft and non-tender without masses, organomegaly or hernias noted.  No guarding or rebound Extremities:  No cyanosis, edema, or deformity noted  Skin:  Intact without suspicious lesions or rashes Neurologic exam : Cn 2-7 intact Strength equal & normal in upper & lower extremities Able to walk (patient report, did not get her out of bed) Romberg normal, finger to nose  PERRLA  Assessment/Plan 56 y/o aaf with Trach stoma s/p larygneal  resection for Ca in remission. Transferred from [facility] ED 2nd to poor airway protection 2nd to secretions and new seizures witnessed in ED.  1. Respiratory secretions Doubt this is infective process - normal vitals, no fever, normal WBC, patient reports no recent illness. Pulmonary toilet via respiratory therapy - they have seen patient already Will have patient follow with Dr. [name] her surgeon to discuss widening her Stoma. Not critically stenosed at this time. mucinex   2. Seizures CT head negative. Would consider MRI with history of laryngeal ca to rule out metastasis EEG Keep in stepdown 2nd to airway protection in case of recurrent seizure Her history suggests she might have had seizures before.  3. Larygneal Ca In remission Plan to f/u with Dr. Gaylyn Rong as O/P  4. HTN  Home meds restarted  5. Diet Regular diet - patient says she eats with her mouth (has hx of gastrostomy)  6. FENGI Lovenox  7. Dispo: Pending seizure workup and comfort of patient managing secretions.     Edd Arbour MD 07/14/2011, 3:43 AM

## 2011-07-14 NOTE — ED Notes (Signed)
[facility] called and given report will arrive in 5

## 2011-07-14 NOTE — ED Notes (Signed)
Patient reports difficulty breathing at this time and unable to cough up secretions. RT notified and at bedside. Ivonne Andrew, PA to bedside. Patient suctioned around stoma, sterile saline placed in trach, chest-physiotherapy to back. Vital signs as charted. Will continue to monitor.

## 2011-07-14 NOTE — ED Notes (Signed)
Dr. Knapp at bedside with patient. 

## 2011-07-15 ENCOUNTER — Inpatient Hospital Stay (HOSPITAL_COMMUNITY): Payer: Medicaid Other

## 2011-07-15 LAB — BASIC METABOLIC PANEL
BUN: 22 mg/dL (ref 6–23)
CO2: 28 mEq/L (ref 19–32)
Chloride: 102 mEq/L (ref 96–112)
Creatinine, Ser: 0.94 mg/dL (ref 0.50–1.10)
Glucose, Bld: 89 mg/dL (ref 70–99)

## 2011-07-15 LAB — CBC
HCT: 33.8 % — ABNORMAL LOW (ref 36.0–46.0)
Hemoglobin: 11.3 g/dL — ABNORMAL LOW (ref 12.0–15.0)
MCV: 90.1 fL (ref 78.0–100.0)
RBC: 3.75 MIL/uL — ABNORMAL LOW (ref 3.87–5.11)
RDW: 12.6 % (ref 11.5–15.5)
WBC: 3.7 10*3/uL — ABNORMAL LOW (ref 4.0–10.5)

## 2011-07-15 MED ORDER — SODIUM CHLORIDE 0.9 % IV SOLN: 250.0000 mL | INTRAVENOUS | Status: DC | PRN

## 2011-07-15 MED ORDER — SODIUM CHLORIDE 0.9 % IV SOLN
INTRAVENOUS | Status: DC
  Administered 2011-07-15: 11:00:00 via INTRAVENOUS
  Administered 2011-07-16: 20 mL/h via INTRAVENOUS
  Administered 2011-07-16: 15:00:00 via INTRAVENOUS
  Administered 2011-07-16: 20 mL/h via INTRAVENOUS

## 2011-07-15 MED ORDER — DOCUSATE SODIUM 100 MG PO CAPS
100.0000 mg | ORAL_CAPSULE | Freq: Two times a day (BID) | ORAL | Status: DC
  Administered 2011-07-15 – 2011-07-17 (×3): 100 mg via ORAL
  Filled 2011-07-15 (×5): qty 1

## 2011-07-15 MED ORDER — LEVOTHYROXINE SODIUM 100 MCG PO TABS
100.0000 ug | ORAL_TABLET | Freq: Every day | ORAL | Status: DC
  Administered 2011-07-16 – 2011-07-17 (×3): 100 ug via ORAL
  Filled 2011-07-15 (×3): qty 1

## 2011-07-15 MED ORDER — POLYETHYLENE GLYCOL 3350 17 G PO PACK
17.0000 g | PACK | Freq: Every day | ORAL | Status: DC
  Administered 2011-07-15 – 2011-07-17 (×3): 17 g via ORAL
  Filled 2011-07-15 (×3): qty 1

## 2011-07-15 NOTE — Progress Notes (Signed)
I have seen and examined this patient. I have discussed with Dr Hunter.  I agree with their findings and plans as documented in their progress note for today.  

## 2011-07-15 NOTE — Progress Notes (Signed)
PGY-1 Daily Progress Note Family Medicine Teaching Service Deborah [name]. Deborah Sleigh, MD Service Pager: [phone]   Subjective: Still having some secretions and some trouble clearing them. Patient ready for OR as she still feels like stoma size is closing in and making her uncomfortable/difficult to suction. Says breathing comfortably though.   About to get EEG  Objective:  Temp:  [97.8 F (36.6 C)-98.3 F (36.8 C)] 97.8 F (36.6 C) (01/29 0411) Pulse Rate:  [55-82] 65  (01/29 0744) Resp:  [5-23] 13  (01/29 0744) BP: (112-153)/(60-94) 113/74 mmHg (01/29 0500) SpO2:  [98 %-100 %] 99 % (01/29 0744) FiO2 (%):  [35 %] 35 % (01/29 0744) Weight:  [170 lb 13.7 oz (77.5 kg)] 170 lb 13.7 oz (77.5 kg) (01/29 0023)  Intake/Output Summary (Last 24 hours) at 07/15/11 0748 Last data filed at 07/14/11 1900  Gross per 24 hour  Intake    480 ml  Output    300 ml  Net    180 ml    Gen:  NAD, trach in place, can talk with finger over stoma but patient primarily nods head HEENT: moist mucous membranes CV: Regular rate and rhythm, no murmurs rubs or gallops PULM: clear to auscultation bilaterally. No wheezes/rales/rhonchi ABD: soft/nontender/nondistended/normal bowel sounds EXT: No edema, SCDs in place, moves all x4 Neuro: Alert and oriented x3  Labs and imaging:   CBC  Lab 07/15/11 0509 07/14/11 0525 07/13/11 2305  WBC 3.7* 3.0* 3.3*  HGB 11.3* 11.2* 11.9*  HCT 33.8* 34.0* 36.0  PLT 293 280 308   BMET  Lab 07/15/11 0509 07/14/11 0525 07/13/11 2305  NA 138 142 140  K 4.3 4.1 3.6  CL 102 103 102  CO2 28 31 28   BUN 22 9 10   CREATININE 0.94 1.00 0.99  LABGLOM -- -- --  GLUCOSE 89 -- --  CALCIUM 10.0 9.9 10.0    Ct Head Wo Contrast  07/14/2011  *RADIOLOGY REPORT*  Clinical Data: The the patient reports episode of unresponsiveness. History of laryngeal cancer post surgery, chemotherapy, and radiation therapy.  CT HEAD WITHOUT CONTRAST  Technique:  Contiguous axial images were obtained  from the base of the skull through the vertex without contrast.  Comparison: 11/18/2010  Findings: The ventricles and sulci appear symmetrical.  No mass effect or midline shift.  No abnormal extra-axial fluid collections.  No ventricular dilatation.  Gray-white matter junctions are distinct.  Basal cisterns are not effaced.  No evidence of acute intracranial hemorrhage.  No depressed skull fractures.  Opacification of the left maxillary antrum with mucous membrane thickening in the right maxillary antrum, demonstrating progression since the previous study.  Otherwise, no change.  IMPRESSION: No evidence of acute intracranial hemorrhage, mass lesion, or acute infarct.  Note that contrast enhanced MRI would be more sensitive for detection of metastasis if this is clinically considered.  Original Report Authenticated By: Marlon Pel, M.D.   Ct Soft Tissue Neck W Contrast  07/14/2011  *RADIOLOGY REPORT*  Clinical Data: Laryngeal cancer.  Post surgery, chemotherapy and radiation therapy.  Tracheostomy September 2012.  Recent episode of inability to clear the airway.  CT NECK WITH CONTRAST  Technique:  Multidetector CT imaging of the neck was performed with intravenous contrast.  Contrast: 75mL OMNIPAQUE IOHEXOL 300 MG/ML IV SOLN  Comparison: 01/17/2011.  Findings: Post laryngectomy and left thyroid lobe resection.  There is a radiopaque structure at the posterior margin of the lower cervical trachea imbedded in the posterior wall. Communication with the adjacent  esophagus not excluded.  Clinical correlation recommended.  Diffuse thickening of the esophagus once again noted.  Mild thickening of the trachea below the tracheostomy site but without complete occlusion or coaptation membranes.  Diffuse infiltration of fat planes throughout the lower head, neck and upper chest.  Dense appearance of the submandibular glands. Findings are consistent with post radiation therapy changes with hyperenhancement of the  mucosal membranes throughout the pharynx in a symmetric fashion.  Since the prior examination, there has been any this increase in amount of prevertebral and parapharyngeal fluid.  Etiology indeterminate.  Scattered lymph nodes including a small left parotid lymph nodes and sub mental lymph nodes with minimal change since prior exam (most notable involving left submental lymph node series 3 image 49 although still containing a fatty center).  Opacification maxillary sinuses greater on the left.  Cervical spondylotic changes without bony destructive lesion.  The appearance of the left aspect of the mandible without change in this patient who is edentulous.  Aberrant right subclavian artery. Progressive apical parenchymal changes may represent result of radiation therapy.  Mediport catheter enters from the right.  The full extent is not imaged on the present exam.  IMPRESSION: Post laryngectomy and left thyroid lobe resection.  There is a radiopaque structure at the posterior margin of the lower cervical trachea imbedded in the posterior wall. Communication with the adjacent esophagus not excluded.  Clinical correlation recommended.  Diffuse thickening of the esophagus once again noted.  Mild thickening of the trachea below the tracheostomy site but without complete occlusion or coaptation membranes.  Diffuse infiltration of fat planes throughout the lower head, neck and upper chest.  Dense appearance of the submandibular glands. Findings are consistent with post radiation therapy changes with hyperenhancement of the mucosal membranes throughout the pharynx in a symmetric fashion.  Since the prior examination, there has been any this increase in amount of prevertebral and parapharyngeal fluid.  Etiology indeterminate.  Original Report Authenticated By: Fuller Canada, M.D.   Mr Deborah [name] Wo Contrast  07/15/2011  *RADIOLOGY REPORT*  Clinical Data: 56 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  New onset seizures.   Blackout.  MRI HEAD WITHOUT AND WITH CONTRAST  Technique:  Multiplanar, multiecho pulse sequences of the brain and surrounding structures were obtained according to standard protocol without and with intravenous contrast  Contrast: 15mL MULTIHANCE GADOBENATE DIMEGLUMINE 529 MG/ML IV SOLN  Comparison: Neck CT 07/14/2011 and earlier.  Head CT without contrast 11/18/2010.  Findings: The pituitary appears prominent but is within normal size limits for age and gender.  No discrete pituitary lesion.  No abnormal enhancement identified.  No restricted diffusion to suggest acute infarction.  No midline shift, mass effect, evidence of mass lesion, ventriculomegaly, extra-axial collection or acute intracranial hemorrhage.  Cervicomedullary junction within normal limits.  Deborah [name] and white matter signal is within normal limits for age throughout the brain.  Medial temporal lobes, deep gray matter nuclei, brainstem, and cerebellum are within normal limits. Negative visualized cervical spine. Visualized bone marrow signal is within normal limits.  Major intracranial vascular flow voids are preserved; dominant distal left vertebral artery is mildly tortuous and effaces the pontomedullary junction.  Insert stable orbits the mastoids are clear.  Grossly normal visualized internal auditory structures.  Paranasal sinus inflammatory changes including significant opacification of both maxillary sinuses.  Probable large mucous retention cyst on the left.  Negative scalp soft tissues.  IMPRESSION: No acute or metastatic intracranial abnormality.  Original Report Authenticated By: Harley Hallmark, M.D.  Dg Chest Port 1 View  07/14/2011  *RADIOLOGY REPORT*  Clinical Data: Chest pain, wheezing, shortness of breath  PORTABLE CHEST - 1 VIEW  Comparison: 07/13/2011  Findings: Shallow inspiration.  Normal heart size and pulmonary vascularity for technique.  Power port central venous catheter over the SVC / RA junction.  No focal airspace  consolidation.  No blunting of costophrenic angles.  No pneumothorax.  Extrapulmonary densities projected over the lower chest.  No significant change since previous study.  IMPRESSION: No evidence of active pulmonary disease.  Original Report Authenticated By: Marlon Pel, M.D.   Dg Chest Portable 1 View  07/13/2011  *RADIOLOGY REPORT*  Clinical Data: Shortness of breath, weakness, history hypertension, smoking  PORTABLE CHEST - 1 VIEW  Comparison: Portable exam 2247 hours compared to 04/16/2011  Findings: Right side power port stable, tip projecting over SVC. Upper-normal size of cardiac silhouette. Mediastinal contours and pulmonary vascularity normal. Chronic right basilar atelectasis versus scarring. External artifacts project over lower right chest. No pulmonary infiltrate, pleural effusion or pneumothorax. No acute osseous findings.  IMPRESSION: Chronic right basilar atelectasis versus scarring.  Original Report Authenticated By: Lollie Marrow, M.D.     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 Transferred from [facility] ED 2nd to poor airway protection 2nd to secretions and new seizures witnessed in ED.   1. Respiratory secretions -Doubt this is infective process - normal vitals, no fever, low WBC, patient reports no recent illness.   *Pulmonary toilet via respiratory therapy - they have seen patient already.   *Patient complains of feeling like stoma is getting smaller  *Dr. [name] to take patient to OR 1/30 for dilation and replacement of stoma due to radiation induced stomatitis  and tracheitis.   *mucinex  2. Seizures-CT head negative.   *-MRI head no acute or metastatic changes   *EEG -pending  *no recurrence of seizures. In stepdown in case of recurrent seizure for airway protection  *Her history suggests she might have had seizures before.   *discontinued welbutrin as lowers seizure threshold.  3. Larygneal Ca -In remission with Plan to  f/u with Dr. Gaylyn Rong as O/P. Completed CT neck for Dr. Gaylyn Rong as was planned outpatient. There are post radiation changes and a possible area of communication between esophagus and trachea. Other results available per details above. Results will be available for Dr. Gaylyn Rong and ENT 4. HTN -on home meds with blood pressures typically well controlled 5. Hypothyroidism-will increase synthroid to as TSH elevated to almost 23 (28 previously) and T3, T4 low.   6. FENGI  -NPO for OR but otherwise regular diet(patient says she eats with her mouth (has hx of gastrostomy) ) 7. PPx-Lovenox  8. Dispo: Pending seizure workup, OR operation, and comfort of patient managing secretions.   Tana Conch, MD PGY1, Family Medicine Teaching Service [phone]

## 2011-07-15 NOTE — Progress Notes (Signed)
Uncertain that today patient was going for procedure today. Per Dr. [name] notes it would be today I called OR patient scheduled for for 07/16/11 at 0930. I called Dr. Lucky Rathke office spoke with Misty Stanley she said it will be tomorrow and confirmed that with DR.Rosen. Diet will be resumed today and NPO after midnight for procedure tomorrow.

## 2011-07-15 NOTE — Procedures (Signed)
REFERRING PHYSICIAN:  Paula Compton, MD  HISTORY:  A 56 year old female with seizure-like episodes.  MEDICATIONS:  Wellbutrin, Synthroid, Lopressor, Prinivil and Zestril.  CONDITIONS OF RECORDING:  This is a 16 channel EEG carried out with the patient in the awake and drowsy states.  DESCRIPTION:  The waking background activity consists of a low-voltage, symmetrical, fairly well-organized 10 Hz alpha activity seen from the parieto-occipital and posterotemporal regions.  Low-voltage, fast activity, poorly organized was seen and during at times superimposed on more posterior rhythms.  A mixture of theta and alpha rhythm was seen from the central and temporal regions.  The patient drowses with slowing to irregular which is theta and beta activity.  Stage 2 sleep was not obtained.  Hypoventilation was not performed.  Intermittent photic stimulation failed to elicit any change in the tracing.  IMPRESSION:  This is a normal awake and drowsy EEG.  No epileptiform activity was noted.          ______________________________ Thana Farr, MD    [id number] D:  07/15/2011 17:44:06  T:  07/15/2011 19:03:45  Job #:  [id number]

## 2011-07-16 ENCOUNTER — Encounter (HOSPITAL_COMMUNITY)
Admission: EM | Disposition: A | Discharge status: Home / Self Care | Payer: Self-pay | Source: Ambulatory Visit | Attending: Family Medicine

## 2011-07-16 ENCOUNTER — Encounter (HOSPITAL_COMMUNITY): Payer: Self-pay | Admitting: Anesthesiology

## 2011-07-16 ENCOUNTER — Inpatient Hospital Stay (HOSPITAL_COMMUNITY): Payer: Medicaid Other | Admitting: Anesthesiology

## 2011-07-16 HISTORY — PX: TRACHEAL DILITATION: SHX5068

## 2011-07-16 LAB — BASIC METABOLIC PANEL
BUN: 17 mg/dL (ref 6–23)
CO2: 27 mEq/L (ref 19–32)
Chloride: 103 mEq/L (ref 96–112)
Creatinine, Ser: 0.98 mg/dL (ref 0.50–1.10)
Potassium: 4.1 mEq/L (ref 3.5–5.1)

## 2011-07-16 LAB — CBC
HCT: 34.8 % — ABNORMAL LOW (ref 36.0–46.0)
Hemoglobin: 11.6 g/dL — ABNORMAL LOW (ref 12.0–15.0)
MCV: 90.2 fL (ref 78.0–100.0)
RBC: 3.86 MIL/uL — ABNORMAL LOW (ref 3.87–5.11)
WBC: 5 10*3/uL (ref 4.0–10.5)

## 2011-07-16 SURGERY — DILATION, TRACHEA
Anesthesia: General | Site: Neck | Wound class: Clean Contaminated

## 2011-07-16 MED ORDER — PROPOFOL 10 MG/ML IV EMUL
INTRAVENOUS | Status: DC | PRN
  Administered 2011-07-16: 200 mg via INTRAVENOUS

## 2011-07-16 MED ORDER — PROMETHAZINE HCL 25 MG/ML IJ SOLN: 6.2500 mg | INTRAMUSCULAR | Status: DC | PRN

## 2011-07-16 MED ORDER — LIDOCAINE HCL 4 % MT SOLN
OROMUCOSAL | Status: DC | PRN
  Administered 2011-07-16: 4 mL via TOPICAL

## 2011-07-16 MED ORDER — MIDAZOLAM HCL 5 MG/5ML IJ SOLN
INTRAMUSCULAR | Status: DC | PRN
  Administered 2011-07-16: 1 mg via INTRAVENOUS

## 2011-07-16 MED ORDER — MEPERIDINE HCL 25 MG/ML IJ SOLN: 6.2500 mg | INTRAMUSCULAR | Status: DC | PRN

## 2011-07-16 MED ORDER — FENTANYL CITRATE 0.05 MG/ML IJ SOLN
25.0000 ug | INTRAMUSCULAR | Status: DC | PRN
  Administered 2011-07-16 (×2): 50 ug via INTRAVENOUS

## 2011-07-16 MED ORDER — SODIUM CHLORIDE 0.9 % IV SOLN
INTRAVENOUS | Status: DC | PRN
  Administered 2011-07-16: 10:00:00 via INTRAVENOUS

## 2011-07-16 MED ORDER — FENTANYL CITRATE 0.05 MG/ML IJ SOLN
INTRAMUSCULAR | Status: DC | PRN
  Administered 2011-07-16: 100 ug via INTRAVENOUS

## 2011-07-16 MED ORDER — PHENYLEPHRINE HCL 10 MG/ML IJ SOLN
INTRAMUSCULAR | Status: DC | PRN
  Administered 2011-07-16: 120 ug via INTRAVENOUS

## 2011-07-16 SURGICAL SUPPLY — 34 items
BENZOIN TINCTURE PRP APPL 2/3 (GAUZE/BANDAGES/DRESSINGS) IMPLANT
BLADE SURG 11 STRL SS (BLADE) IMPLANT
BLADE SURG 15 STRL LF DISP TIS (BLADE) ×2 IMPLANT
BLADE SURG 15 STRL SS (BLADE) ×1
BLADE SURG ROTATE 9660 (MISCELLANEOUS) IMPLANT
CANISTER SUCTION 2500CC (MISCELLANEOUS) ×3 IMPLANT
CLEANER TIP ELECTROSURG 2X2 (MISCELLANEOUS) IMPLANT
CLOTH BEACON ORANGE TIMEOUT ST (SAFETY) ×3 IMPLANT
COVER SURGICAL LIGHT HANDLE (MISCELLANEOUS) ×3 IMPLANT
DECANTER SPIKE VIAL GLASS SM (MISCELLANEOUS) IMPLANT
ELECT COATED BLADE 2.86 ST (ELECTRODE) ×3 IMPLANT
ELECT REM PT RETURN 9FT ADLT (ELECTROSURGICAL) ×3
ELECTRODE REM PT RTRN 9FT ADLT (ELECTROSURGICAL) ×2 IMPLANT
GAUZE SPONGE 4X4 16PLY XRAY LF (GAUZE/BANDAGES/DRESSINGS) ×3 IMPLANT
GLOVE ECLIPSE 6.5 STRL STRAW (GLOVE) ×3 IMPLANT
GLOVE ECLIPSE 7.5 STRL STRAW (GLOVE) ×3 IMPLANT
GOWN STRL NON-REIN LRG LVL3 (GOWN DISPOSABLE) ×3 IMPLANT
KIT BASIN OR (CUSTOM PROCEDURE TRAY) ×3 IMPLANT
KIT ROOM TURNOVER OR (KITS) ×3 IMPLANT
NEEDLE HYPO 25GX1X1/2 BEV (NEEDLE) IMPLANT
NS IRRIG 1000ML POUR BTL (IV SOLUTION) ×3 IMPLANT
PACK EENT II TURBAN DRAPE (CUSTOM PROCEDURE TRAY) ×3 IMPLANT
PAD ARMBOARD 7.5X6 YLW CONV (MISCELLANEOUS) ×6 IMPLANT
PENCIL FOOT CONTROL (ELECTRODE) ×3 IMPLANT
SUT CHROMIC 2 0 SH (SUTURE) ×3 IMPLANT
SUT ETHILON 3 0 PS 1 (SUTURE) ×3 IMPLANT
SUT SILK 4 0 TIE 10X30 (SUTURE) ×3 IMPLANT
SUT SILK 4 0 TIES 17X18 (SUTURE) IMPLANT
SYR 20CC LL (SYRINGE) IMPLANT
SYR CONTROL 10ML LL (SYRINGE) IMPLANT
TOWEL OR 17X24 6PK STRL BLUE (TOWEL DISPOSABLE) ×3 IMPLANT
TOWEL OR 17X26 10 PK STRL BLUE (TOWEL DISPOSABLE) ×3 IMPLANT
TUBE CONNECTING 12X1/4 (SUCTIONS) ×3 IMPLANT
WATER STERILE IRR 1000ML POUR (IV SOLUTION) IMPLANT

## 2011-07-16 NOTE — Transfer of Care (Signed)
Immediate Anesthesia Transfer of Care Note  Patient: Deborah [name]  Procedure(s) Performed:  TRACHEAL DILITATION - dilation of tracheal stoma and replacement of stoma tube  Patient Location: PACU  Anesthesia Type: General  Level of Consciousness: awake and alert   Airway & Oxygen Therapy: Patient Spontanous Breathing  Post-op Assessment: Report given to PACU RN  Post vital signs: Reviewed and stable  Complications: No apparent anesthesia complications

## 2011-07-16 NOTE — Progress Notes (Signed)
   ENT Progress Note: s/p Procedure(s): TRACHEAL DILITATION   Subjective: Pt stable, mild discomfort  Objective: Vital signs in last 24 hours: Temp:  [97.4 F (36.3 C)-98 F (36.7 C)] 98 F (36.7 C) (01/30 1608) Pulse Rate:  [52-81] 67  (01/30 1608) Resp:  [12-23] 14  (01/30 1608) BP: (107-146)/(63-86) 115/72 mmHg (01/30 1608) SpO2:  [97 %-100 %] 97 % (01/30 1608) FiO2 (%):  [35 %] 35 % (01/30 1608) Weight:  [77.6 kg (171 lb 1.2 oz)] 77.6 kg (171 lb 1.2 oz) (01/30 0100) Weight change: 0.1 kg (3.5 oz) Last BM Date: 07/12/11  Intake/Output from previous day: 01/29 0701 - 01/30 0700 In: 440 [I.V.:440] Out: -  Intake/Output this shift: Total I/O In: 640 [I.V.:640] Out: 300 [Urine:300]  Labs:  Basename 07/16/11 0500 07/15/11 0509  WBC 5.0 3.7*  HGB 11.6* 11.3*  HCT 34.8* 33.8*  PLT 294 293    Basename 07/16/11 0500 07/15/11 0509  NA 140 138  K 4.1 4.3  CL 103 102  CO2 27 28  GLUCOSE 78 89  BUN 17 22  CALCIUM 9.6 10.0    Studies/Results: Mr Lodema Pilot Contrast  07/15/2011  *RADIOLOGY REPORT*  Clinical Data: 56 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  New onset seizures.  Blackout.  MRI HEAD WITHOUT AND WITH CONTRAST  Technique:  Multiplanar, multiecho pulse sequences of the brain and surrounding structures were obtained according to standard protocol without and with intravenous contrast  Contrast: 15mL MULTIHANCE GADOBENATE DIMEGLUMINE 529 MG/ML IV SOLN  Comparison: Neck CT 07/14/2011 and earlier.  Head CT without contrast 11/18/2010.  Findings: The pituitary appears prominent but is within normal size limits for age and gender.  No discrete pituitary lesion.  No abnormal enhancement identified.  No restricted diffusion to suggest acute infarction.  No midline shift, mass effect, evidence of mass lesion, ventriculomegaly, extra-axial collection or acute intracranial hemorrhage.  Cervicomedullary junction within normal limits.  Wallace Cullens and white matter signal is within  normal limits for age throughout the brain.  Medial temporal lobes, deep gray matter nuclei, brainstem, and cerebellum are within normal limits. Negative visualized cervical spine. Visualized bone marrow signal is within normal limits.  Major intracranial vascular flow voids are preserved; dominant distal left vertebral artery is mildly tortuous and effaces the pontomedullary junction.  Insert stable orbits the mastoids are clear.  Grossly normal visualized internal auditory structures.  Paranasal sinus inflammatory changes including significant opacification of both maxillary sinuses.  Probable large mucous retention cyst on the left.  Negative scalp soft tissues.  IMPRESSION: No acute or metastatic intracranial abnormality.  Original Report Authenticated By: Harley Hallmark, M.D.     PHYSICAL EXAM: Stoma stable, airway intact, no bleeding   Assessment/Plan: Monitor O/N for airway issues    Deborah [name] L 07/16/2011, 5:52 PM

## 2011-07-16 NOTE — Anesthesia Postprocedure Evaluation (Signed)
  Anesthesia Post-op Note  Patient: Deborah [name]  Procedure(s) Performed:  TRACHEAL DILITATION - dilation of tracheal stoma and replacement of stoma tube  Patient Location: PACU  Anesthesia Type: General  Level of Consciousness: awake  Airway and Oxygen Therapy: Patient Spontanous Breathing and Patient connected to tracheostomy mask oxygen  Post-op Pain: mild  Post-op Assessment: Post-op Vital signs reviewed, Patient's Cardiovascular Status Stable, Respiratory Function Stable, Patent Airway and No signs of Nausea or vomiting  Post-op Vital Signs: Reviewed and stable  Complications: No apparent anesthesia complications

## 2011-07-16 NOTE — Anesthesia Preprocedure Evaluation (Signed)
Anesthesia Evaluation  Patient identified by MRN, date of birth, ID band Patient awake    Reviewed: Allergy & Precautions, H&P , NPO status , Patient's Chart, lab work & pertinent test results, reviewed documented beta blocker date and time   Airway Mallampati: II TM Distance: >3 FB Neck ROM: Full    Dental No notable dental hx. (+) Edentulous Upper and Edentulous Lower   Pulmonary asthma ,  Trach clear to auscultation  Pulmonary exam normal       Cardiovascular hypertension, On Home Beta Blockers and On Medications Regular Normal    Neuro/Psych Negative Neurological ROS  Negative Psych ROS   GI/Hepatic negative GI ROS, Neg liver ROS,   Endo/Other  Negative Endocrine ROS  Renal/GU negative Renal ROS  Genitourinary negative   Musculoskeletal   Abdominal   Peds  Hematology negative hematology ROS (+)   Anesthesia Other Findings   Reproductive/Obstetrics negative OB ROS                           Anesthesia Physical Anesthesia Plan  ASA: III  Anesthesia Plan: General   Post-op Pain Management:    Induction: Intravenous  Airway Management Planned: Tracheostomy  Additional Equipment:   Intra-op Plan:   Post-operative Plan: Extubation in OR  Informed Consent: I have reviewed the patients History and Physical, chart, labs and discussed the procedure including the risks, benefits and alternatives for the proposed anesthesia with the patient or authorized representative who has indicated his/her understanding and acceptance.     Plan Discussed with: CRNA  Anesthesia Plan Comments:         Anesthesia Quick Evaluation

## 2011-07-16 NOTE — Progress Notes (Signed)
PGY-1 Daily Progress Note Family Medicine Teaching Service Jaimarie Rapozo M. Devanie Galanti, MD Service Pager: [phone]   Subjective: Patient doing well this morning. States she has not had any SOB this morning but still feels she has excessive secretions. Will go to procedure today at 0930.   Objective:  Temp:  [97.7 F (36.5 C)-97.9 F (36.6 C)] 97.7 F (36.5 C) (01/30 0433) Pulse Rate:  [52-70] 59  (01/30 0433) Resp:  [12-23] 12  (01/30 0433) BP: (107-146)/(76-86) 107/76 mmHg (01/30 0433) SpO2:  [98 %-100 %] 100 % (01/30 0433) FiO2 (%):  [35 %] 35 % (01/30 0433) Weight:  [171 lb 1.2 oz (77.6 kg)] 171 lb 1.2 oz (77.6 kg) (01/30 0100)  Intake/Output Summary (Last 24 hours) at 07/16/11 0710 Last data filed at 07/16/11 0700  Gross per 24 hour  Intake    440 ml  Output      0 ml  Net    440 ml    Gen:  NAD, blow by in place, can talk with finger over stoma but patient primarily nods head HEENT: moist mucous membranes CV: Regular rate and rhythm, no murmurs  PULM: clear to auscultation bilaterally. No wheezes/rales/rhonchi ABD: soft/nontender/nondistended/normal bowel sounds EXT: No edema, SCDs in place, moves all x4 Neuro: Alert and oriented x3  Labs and imaging:   CBC  Lab 07/16/11 0500 07/15/11 0509 07/14/11 0525  WBC 5.0 3.7* 3.0*  HGB 11.6* 11.3* 11.2*  HCT 34.8* 33.8* 34.0*  PLT 294 293 280   BMET  Lab 07/16/11 0500 07/15/11 0509 07/14/11 0525  NA 140 138 142  K 4.1 4.3 4.1  CL 103 102 103  CO2 27 28 31   BUN 17 22 9   CREATININE 0.98 0.94 1.00  LABGLOM -- -- --  GLUCOSE 78 -- --  CALCIUM 9.6 10.0 9.9    Ct Soft Tissue Neck W Contrast 07/14/2011  IMPRESSION: Post laryngectomy and left thyroid lobe resection.  There is a radiopaque structure at the posterior margin of the lower cervical trachea imbedded in the posterior wall. Communication with the adjacent esophagus not excluded.  Clinical correlation recommended.  Diffuse thickening of the esophagus once again noted.   Mild thickening of the trachea below the tracheostomy site but without complete occlusion or coaptation membranes.  Diffuse infiltration of fat planes throughout the lower head, neck and upper chest.  Dense appearance of the submandibular glands. Findings are consistent with post radiation therapy changes with hyperenhancement of the mucosal membranes throughout the pharynx in a symmetric fashion.  Since the prior examination, there has been any this increase in amount of prevertebral and parapharyngeal fluid.  Etiology indeterminate.    Mr [name] Wo Contrast 07/15/2011  Findings: The pituitary appears prominent but is within normal size limits for age and gender.  No discrete pituitary lesion.  No abnormal enhancement identified.  No restricted diffusion to suggest acute infarction.  No midline shift, mass effect, evidence of mass lesion, ventriculomegaly, extra-axial collection or acute intracranial hemorrhage.  Cervicomedullary junction within normal limits.  Wallace Cullens and white matter signal is within normal limits for age throughout the brain.  Medial temporal lobes, deep gray matter nuclei, brainstem, and cerebellum are within normal limits. Negative visualized cervical spine. Visualized bone marrow signal is within normal limits.  Major intracranial vascular flow voids are preserved; dominant distal left vertebral artery is mildly tortuous and effaces the pontomedullary junction.  Insert stable orbits the mastoids are clear.  Grossly normal visualized internal auditory structures.  Paranasal sinus inflammatory  changes including significant opacification of both maxillary sinuses.  Probable large mucous retention cyst on the left.  Negative scalp soft tissues.  IMPRESSION: No acute or metastatic intracranial abnormality.    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 Transferred from [facility] ED 2nd to poor airway protection 2nd to secretions and new seizures witnessed in  ED.   1. Respiratory secretions -Doubt this is infective process although a component of trachitis could be contributing to discomfort and sweling- normal vitals, no fever, low WBC, patient reports no recent illness. Patient complains of feeling like stoma is getting smaller and making it more difficult to clear secretions. Dr. [name] did original procedure in Nov 2012, and he is following along. We appreciate his help. - Pulmonary toilet via respiratory therapy - they have seen patient already.  - Dr. [name] to take patient to OR today at 0930 for dilation and replacement of stoma due to radiation induced stomatitis and tracheitis.  - Continue mucinex for secretions   2. Seizures-CT head negative MRI head no acute or metastatic changes. EEG showed normal activity. No recurrence of seizures since admission. In stepdown in case of recurrent seizure for airway protection - Her history suggests she might have had seizures before.   - Discontinued welbutrin as lowers seizure threshold.  - If patient is stable following procedure today, will transfer out of SDU  3. Larygneal Ca -In remission with Plan to f/u with Dr. Gaylyn Rong as O/P. Completed CT neck for Dr. Gaylyn Rong as was planned outpatient. There are post radiation changes and a possible area of communication between esophagus and trachea. Other results available per details above. Results will be available for Dr. Gaylyn Rong and ENT.  4. HTN -Stable. On home meds with blood pressures typically well controlled  5. Hypothyroidism- Increased synthroid to as TSH elevated to almost 23 (28 previously) and T3, T4 low.   6. FENGI  -NPO for OR today. Will restart regular diet post-op (patient says she eats with her mouth (has hx of gastrostomy) ) 7. PPx-Lovenox  8. Dispo: Pending OR operation, and comfort of patient managing secretions. Transfer out of SDU today.  Discharge pending overall clinical improvement.  Deborah [name]

## 2011-07-16 NOTE — Op Note (Signed)
OPERATIVE REPORT  DATE OF SURGERY: 07/16/2011  PATIENT:  Deborah [name],  56 y.o. female  PRE-OPERATIVE DIAGNOSIS:  tracheitis, stomal obstruction  POST-OPERATIVE DIAGNOSIS:  tracheitis, stomal obstruction  PROCEDURE:  Procedure(s): TRACHEAL DILITATION  SURGEON:  [name], MD  ASSISTANTS: none   ANESTHESIA:   general  EBL:  5 ml  DRAINS: none   LOCAL MEDICATIONS USED:  NONE  SPECIMEN:  No Specimen  COUNTS:  YES  PROCEDURE DETAILS: Patient was taken to the operating room and placed on the operating table in the supine position. Following induction of general anesthesia, an endotracheal tube was placed through the tracheal stoma. With adequate level of anesthesia on board, the tube was removed and the stoma was dilated using urethral sounds up to #15. I was then able to replace her stoma vent. Suction was used to evacuate secretions and a small amount of blood. She tolerated this well. She was then awakened extubated and transferred to recovery in stable condition.   PATIENT DISPOSITION:  PACU - hemodynamically stable.

## 2011-07-16 NOTE — Progress Notes (Signed)
  The patient has been re-examined.  The patient's history and physical has been reviewed and is unchanged.  There is no change in the plan of care.   Payden Docter, MD 

## 2011-07-16 NOTE — Progress Notes (Signed)
Report given to elise rn as caregiver 

## 2011-07-16 NOTE — Progress Notes (Signed)
Iv fluids of lr at 50cc/hr infusing via r porta cath cath on arrival to pacu

## 2011-07-16 NOTE — Progress Notes (Signed)
I discussed with Dr Hairford.  I agree with their plans documented in their  Note for today.  

## 2011-07-17 ENCOUNTER — Other Ambulatory Visit: Payer: Self-pay | Admitting: Family Medicine

## 2011-07-17 DIAGNOSIS — R569 Unspecified convulsions: Secondary | ICD-10-CM

## 2011-07-17 DIAGNOSIS — R0602 Shortness of breath: Secondary | ICD-10-CM

## 2011-07-17 DIAGNOSIS — J0410 Acute tracheitis without obstruction: Secondary | ICD-10-CM

## 2011-07-17 HISTORY — DX: Unspecified convulsions: R56.9

## 2011-07-17 LAB — CBC
HCT: 33.7 % — ABNORMAL LOW (ref 36.0–46.0)
Hemoglobin: 11.3 g/dL — ABNORMAL LOW (ref 12.0–15.0)
MCV: 90.8 fL (ref 78.0–100.0)
RDW: 12.7 % (ref 11.5–15.5)
WBC: 5.4 10*3/uL (ref 4.0–10.5)

## 2011-07-17 LAB — BASIC METABOLIC PANEL
BUN: 12 mg/dL (ref 6–23)
Chloride: 101 mEq/L (ref 96–112)
Creatinine, Ser: 0.8 mg/dL (ref 0.50–1.10)
GFR calc Af Amer: >90 mL/min (ref >90)
Glucose, Bld: 70 mg/dL (ref 70–99)
Potassium: 3.9 mEq/L (ref 3.5–5.1)

## 2011-07-17 MED ORDER — GUAIFENESIN ER 600 MG PO TB12: 600.0000 mg | ORAL_TABLET | Freq: Two times a day (BID) | ORAL | Status: DC

## 2011-07-17 MED ORDER — HEPARIN SOD (PORK) LOCK FLUSH 100 UNIT/ML IV SOLN
500.0000 [IU] | Freq: Once | INTRAVENOUS | Status: AC
  Administered 2011-07-17: 500 [IU] via INTRAVENOUS

## 2011-07-17 MED ORDER — LEVOTHYROXINE SODIUM 100 MCG PO TABS: 100.0000 ug | ORAL_TABLET | Freq: Every day | ORAL | Status: DC

## 2011-07-17 MED ORDER — HYDROCODONE-ACETAMINOPHEN 5-325 MG PO TABS: 1.0000 | ORAL_TABLET | ORAL | Status: AC | PRN

## 2011-07-17 NOTE — Progress Notes (Signed)
Called to patient's room and noted that the trach ( no inner cannula, soft outer cannula) has thick secretions on it.  Patient removed the trach and I cleaned the trach. Patient is able to tolerate  Without oxygen.  Informed patient that the trach is supposed to stay on for 5 days.  Patient and her daughter is aware of this order.

## 2011-07-17 NOTE — Discharge Summary (Signed)
Physician Discharge Summary  Patient ID: Deborah [name] MRN: [id number] DOB/AGE: 54-Mar-1957 54 y.o.  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 Transferred from [facility] ED 2nd to poor airway protection 2nd to secretions and new seizures witnessed in ED.  In brief, her hospital problems are as follows:  1. Respiratory secretions: Patient with increased secretions that she could not clear at home which made her feel short of breath. Doubt this is infective process although a component of trachitis could be contributing to discomfort and swelling. Patient had normal vitals, no fever, low WBC, and she reports no recent illness. Patient complained of feeling like stoma is getting smaller and making it more difficult to clear secretions. Dr. [name] (ENT) did original procedure in Nov 2012. While in the hospital, Dr. [name] consulted on patient and did procedure on 07/16/11 for dilation and replacement of stoma due to radiation induced stomatitis and tracheitis. Patient tolerated procedure well and states she feels better. She was also on Mucinex for secretions, which was continued at discharge but she was not started on treatment for tracheitis. Patient will follow-up with ENT as well as PCP.     2. Seizures- Patient had witnessed seizure like activity at [facility] ED. CT head negative. MRI head no acute or metastatic changes. EEG showed normal activity. No recurrence of seizures since admission.  In stepdown at admission in case of recurrent seizure for airway protection. Her history suggests she might have had seizures before since she states she had episodes of feeling tired and not remembering  previous events. Discontinued Wellbutrin as lowers seizure threshold. This will not be restarted as an outpatient. If patient does have depression, would consider restarting antidepressant other than Wellbutrin.   3. Larygneal Ca -In remission with Plan to f/u with Dr. Gaylyn Rong as O/P. (Dr. Gaylyn Rong was aware of her admission.) Completed CT neck for Dr. Gaylyn Rong as was planned outpatient. There are post radiation changes and a possible area of communication between esophagus and trachea. Other results available per details above. Results will be available for Dr. Gaylyn Rong and ENT.   4. HTN -Stable. On home meds with blood pressures typically well controlled. Continued at discharge.  5. Hypothyroidism- Increased synthroid to as TSH elevated to almost 23 (28 previously) and T3, T4 low. Will continue higher dose at discharge, and follow-up with PCP for any further adjustments.  6. Tobacco use- Patient is on patch. Please consider weaning off nicotine patch.   Consults: ENT  Significant Diagnostic Studies:   Treatments: IV hydration and respiratory therapy: O2  Discharge Exam: Blood pressure 123/78, pulse 74, temperature 98.1 F (36.7 C), temperature source Oral, resp. rate 20, height 5\' 7"  (1.702 m), weight 171 lb 15.3 oz (78 kg), SpO2 94.00%.  Recommendations for follow-up: - Consider restarting an antidepressant since Wellbutrin was stopped due to lower seizure threshold - Levothyroxine was increased; consider rechecking TSH when clinically appropriate - Consider weaning off nicotine patch   Disposition: Home or Self Care  Discharge Orders    Future Appointments: Provider: Department: Dept Phone: Center:   07/21/2011 8:45 AM [name], MD [facility] [phone] [facility]   07/24/2011 8:45 AM Dava Najjar Elie Goody [facility] [phone] None  07/24/2011  9:30 AM [facility] [phone] [place]   07/25/2011 10:00 AM [name], MD [facility] [phone] None   09/26/2011 10:15  AM Jonna Coup, MD [facility] [phone] None     Medication List  As of 07/17/2011 12:21 PM   STOP taking these medications         buPROPion 150 MG 24 hr tablet      LORTAB 7.5-500 MG/15ML solution         TAKE these medications         guaiFENesin 600 MG 12 hr tablet   Commonly known as: MUCINEX   Take 1 tablet (600 mg total) by mouth 2 (two) times daily.      HYDROcodone-acetaminophen 5-325 MG per tablet   Commonly known as: NORCO   Take 1-2 tablets by mouth every 4 (four) hours as needed.      levothyroxine 100 MCG tablet   Commonly known as: SYNTHROID, LEVOTHROID   Take 1 tablet (100 mcg total) by mouth daily before breakfast.      lisinopril 10 MG tablet   Commonly known as: PRINIVIL,ZESTRIL   Take 1 tablet (10 mg total) by mouth daily.      metoprolol tartrate 25 MG tablet   Commonly known as: LOPRESSOR   Take 1 tablet (25 mg total) by mouth 2 (two) times daily.      nicotine 14 mg/24hr patch   Commonly known as: NICODERM CQ - dosed in mg/24 hours   Place 1 patch onto the skin daily.      prochlorperazine 10 MG tablet   Commonly known as: COMPAZINE   Take 10 mg by mouth daily as needed. For nausea      tobramycin-dexamethasone ophthalmic solution   Commonly known as: TOBRADEX   Place 4 drops into both eyes daily.           Follow-up Information    Follow up with [name], MD .         SignedMikel Cella, Delcia Spitzley 07/17/2011, 12:21 PM

## 2011-07-17 NOTE — Progress Notes (Signed)
Patient discharge to home in stab le condition.  Discharge instructions and medications given.  Prescription for Hydrocodone given to patient.  Encouraged patient to call Dr. Armen Pickup for follow-up appointment.  Patient and her daughter, Marchelle Folks, verbalized understanding.

## 2011-07-17 NOTE — Progress Notes (Signed)
Patient called,found with stoma tube almost out after a coughing episode.No bleeding at the stoma site.Stoma tube cleaned with peroxide and rinsed with n/saline.Site cleaned as well.Patient was able to re-insert the stoma tube without much difficulty.Tube secured.Will continue to minitor

## 2011-07-17 NOTE — Progress Notes (Signed)
PGY-1 Daily Progress Note Family Medicine Teaching Service Kaysee Hergert M. Willia Lampert, MD Service Pager: [phone]   Subjective: patient doing well this morning. Tolerated procedure well yesterday. States she can breathe better and is able to get secretions out more easily. No concerns today  Objective:  Temp:  [97.4 F (36.3 C)-98.2 F (36.8 C)] 97.9 F (36.6 C) (01/31 0515) Pulse Rate:  [39-121] 72  (01/31 0515) Resp:  [12-22] 14  (01/31 0357) BP: (115-155)/(63-81) 115/75 mmHg (01/31 0515) SpO2:  [75 %-100 %] 97 % (01/31 0515) FiO2 (%):  [35 %] 35 % (01/31 0357) Weight:  [171 lb 15.3 oz (78 kg)] 171 lb 15.3 oz (78 kg) (01/31 0056)  Intake/Output Summary (Last 24 hours) at 07/17/11 0750 Last data filed at 07/17/11 0500  Gross per 24 hour  Intake   1540 ml  Output    600 ml  Net    940 ml    Gen:  NAD, blow by in place, can talk with finger over stoma but patient primarily nods head HEENT: moist mucous membranes CV: Regular rate and rhythm, no murmurs  PULM: clear to auscultation bilaterally. No wheezes/rales/rhonchi ABD: soft/nontender/nondistended/normal bowel sounds EXT: No edema, SCDs in place, moves all x4 Neuro: Alert and oriented x3  Labs and imaging:   CBC  Lab 07/17/11 0500 07/16/11 0500 07/15/11 0509  WBC 5.4 5.0 3.7*  HGB 11.3* 11.6* 11.3*  HCT 33.7* 34.8* 33.8*  PLT 292 294 293   BMET  Lab 07/17/11 0500 07/16/11 0500 07/15/11 0509  NA 137 140 138  K 3.9 4.1 4.3  CL 101 103 102  CO2 28 27 28   BUN 12 17 22   CREATININE 0.80 0.98 0.94  LABGLOM -- -- --  GLUCOSE 70 -- --  CALCIUM 9.1 9.6 10.0    Ct Soft Tissue Neck W Contrast 07/14/2011  IMPRESSION: Post laryngectomy and left thyroid lobe resection.  There is a radiopaque structure at the posterior margin of the lower cervical trachea imbedded in the posterior wall. Communication with the adjacent esophagus not excluded.  Clinical correlation recommended.  Diffuse thickening of the esophagus once again noted.   Mild thickening of the trachea below the tracheostomy site but without complete occlusion or coaptation membranes.  Diffuse infiltration of fat planes throughout the lower head, neck and upper chest.  Dense appearance of the submandibular glands. Findings are consistent with post radiation therapy changes with hyperenhancement of the mucosal membranes throughout the pharynx in a symmetric fashion.  Since the prior examination, there has been any this increase in amount of prevertebral and parapharyngeal fluid.  Etiology indeterminate.    Mr [name] Wo Contrast 07/15/2011  Findings: The pituitary appears prominent but is within normal size limits for age and gender.  No discrete pituitary lesion.  No abnormal enhancement identified.  No restricted diffusion to suggest acute infarction.  No midline shift, mass effect, evidence of mass lesion, ventriculomegaly, extra-axial collection or acute intracranial hemorrhage.  Cervicomedullary junction within normal limits.  Wallace Cullens and white matter signal is within normal limits for age throughout the brain.  Medial temporal lobes, deep gray matter nuclei, brainstem, and cerebellum are within normal limits. Negative visualized cervical spine. Visualized bone marrow signal is within normal limits.  Major intracranial vascular flow voids are preserved; dominant distal left vertebral artery is mildly tortuous and effaces the pontomedullary junction.  Insert stable orbits the mastoids are clear.  Grossly normal visualized internal auditory structures.  Paranasal sinus inflammatory changes including significant opacification of both  maxillary sinuses.  Probable large mucous retention cyst on the left.  Negative scalp soft tissues.  IMPRESSION: No acute or metastatic intracranial abnormality.    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 Transferred from [facility] ED 2nd to poor airway protection 2nd to secretions and new seizures witnessed in  ED.   1. Respiratory secretions Doubt this is infective process although a component of trachitis could be contributing to discomfort and sweling- normal vitals, no fever, low WBC, patient reports no recent illness. Patient complains of feeling like stoma is getting smaller and making it more difficult to clear secretions. Dr. [name] did original procedure in Nov 2012. Dr. [name] did procedure on 07/16/11 for dilation and replacement of stoma due to radiation induced stomatitis and tracheitis.  We appreciate his help in the care of this patient - Pulmonary toilet via respiratory therap - Continue mucinex for secretions  - Patient doing well.  2. Seizures- CT head negative. MRI head no acute or metastatic changes. EEG showed normal activity. No recurrence of seizures since admission.  - In stepdown at admission in case of recurrent seizure for airway protection. If patient is not discharged home today, we can transfer to the floor - Her history suggests she might have had seizures before.   - Discontinued welbutrin as lowers seizure threshold.   3. Larygneal Ca -In remission with Plan to f/u with Dr. Gaylyn Rong as O/P. Completed CT neck for Dr. Gaylyn Rong as was planned outpatient. There are post radiation changes and a possible area of communication between esophagus and trachea. Other results available per details above. Results will be available for Dr. Gaylyn Rong and ENT.  4. HTN -Stable. On home meds with blood pressures typically well controlled  5. Hypothyroidism- Increased synthroid to as TSH elevated to almost 23 (28 previously) and T3, T4 low.   6. FENGI  -Regular diet post-op (patient says she eats with her mouth (has hx of gastrostomy) ) 7. PPx- Lovenox  8. Dispo: Patient has improved and is more comfortable managing secretions after procedure yesterday. Discharge based on ENT recs, but anticipate discharge home within the next 24-48 hours.   Sydell Prowell

## 2011-07-17 NOTE — Progress Notes (Signed)
Patient ID: Deborah [name], female   DOB: 02-Apr-1956, 56 y.o.   MRN: [id number] Doing much better. The tube fell out last night and she was able to replace it with ease. She has since removed it to clean and had no trouble replacing it. Recommend she not keep it out for any length of time until 5 days have passed. Follow up with me in office as needed. From ENT standpoint, she may be discharged.

## 2011-07-17 NOTE — Progress Notes (Signed)
I discussed with Dr Hairford.  I agree with their plans documented in their  Note for today.  

## 2011-07-18 ENCOUNTER — Encounter (HOSPITAL_COMMUNITY): Payer: Self-pay | Admitting: Otolaryngology

## 2011-07-18 NOTE — Discharge Summary (Signed)
I discussed with Dr Dr Mikel Cella.  I agree with their plans documented in their  Discharge Note.

## 2011-07-20 ENCOUNTER — Encounter: Payer: Self-pay | Admitting: Family Medicine

## 2011-07-20 DIAGNOSIS — J329 Chronic sinusitis, unspecified: Secondary | ICD-10-CM

## 2011-07-20 HISTORY — DX: Chronic sinusitis, unspecified: J32.9

## 2011-07-21 ENCOUNTER — Ambulatory Visit: Payer: Medicaid Other | Admitting: Family Medicine

## 2011-07-23 ENCOUNTER — Other Ambulatory Visit: Payer: Self-pay | Admitting: Oncology

## 2011-07-24 ENCOUNTER — Other Ambulatory Visit (HOSPITAL_BASED_OUTPATIENT_CLINIC_OR_DEPARTMENT_OTHER): Payer: Medicaid Other | Admitting: Lab

## 2011-07-24 ENCOUNTER — Ambulatory Visit (HOSPITAL_BASED_OUTPATIENT_CLINIC_OR_DEPARTMENT_OTHER): Payer: Medicaid Other | Admitting: Oncology

## 2011-07-24 ENCOUNTER — Inpatient Hospital Stay (HOSPITAL_COMMUNITY): Admission: RE | Admit: 2011-07-24 | Payer: Self-pay | Source: Ambulatory Visit

## 2011-07-24 VITALS — BP 134/84 | HR 85 | Temp 98.5°F | Ht 67.0 in | Wt 162.4 lb

## 2011-07-24 DIAGNOSIS — Z8521 Personal history of malignant neoplasm of larynx: Secondary | ICD-10-CM

## 2011-07-24 DIAGNOSIS — E039 Hypothyroidism, unspecified: Secondary | ICD-10-CM

## 2011-07-24 DIAGNOSIS — Z87891 Personal history of nicotine dependence: Secondary | ICD-10-CM

## 2011-07-24 DIAGNOSIS — I10 Essential (primary) hypertension: Secondary | ICD-10-CM

## 2011-07-24 DIAGNOSIS — D72819 Decreased white blood cell count, unspecified: Secondary | ICD-10-CM

## 2011-07-24 LAB — CMP (CANCER CENTER ONLY)
BUN, Bld: 12 mg/dL (ref 7–22)
CO2: 30 mEq/L (ref 18–33)
Calcium: 8.9 mg/dL (ref 8.0–10.3)
Chloride: 101 mEq/L (ref 98–108)
Creat: 1.1 mg/dl (ref 0.6–1.2)
Total Bilirubin: 0.5 mg/dl (ref 0.20–1.60)

## 2011-07-24 LAB — CBC WITH DIFFERENTIAL/PLATELET
Basophils Absolute: 0.1 10*3/uL (ref 0.0–0.1)
EOS%: 9.8 % — ABNORMAL HIGH (ref 0.0–7.0)
Eosinophils Absolute: 0.3 10*3/uL (ref 0.0–0.5)
LYMPH%: 27.4 % (ref 14.0–49.7)
MCH: 29.5 pg (ref 25.1–34.0)
MCV: 90.2 fL (ref 79.5–101.0)
MONO%: 7 % (ref 0.0–14.0)
NEUT#: 1.5 10*3/uL (ref 1.5–6.5)
Platelets: 275 10*3/uL (ref 145–400)
RBC: 3.69 10*6/uL — ABNORMAL LOW (ref 3.70–5.45)
RDW: 12.8 % (ref 11.2–14.5)
nRBC: 0 % (ref 0–0)

## 2011-07-24 NOTE — Progress Notes (Signed)
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  PAST THERAPY:  total laryngectomy and bilateral neck dissection on 07/31/2010.  She received adjuvant chemoXRT with daily XRT and weekly cisplatin between 09/03/2010 and 10/01/2010.  Chemotherapy ended two weeks prior to XRT due to poor toleration and dose limiting toxicity.  Radiation concluded on 10/16/2010.  CURRENT THERAPY:  watchful observation.    INTERVAL HISTORY: Deborah [name] 56 y.o. female returns for regular follow up.  She was admitted late January 2013 for SOB, trach secretion and was thought to have tracheitis.  She had trach exchange by Dr. [name] during that admission.  She has been doing well without SOB, DOE, trach secretion.  She does complain of bilateral neck scaring from treatment making it difficult to turn her head without turning her shoulders as well.  She no longer smokes cigarettes.  She does not chew tobacco or drink EtOH.  She has good performance status and is independent of all activities of daily living. She denies dysphagia, odyonophagia, palpable neck mass.  She had two seizure during the last hospital admission with negative work up including brain MRI.  She was advised to discontinue Effexor due to concern of decreasing seizure threshold.  She has not had any seizure since then.    Patient denies fatigue, headache, visual changes, confusion, drenching night sweats, palpable lymph node swelling, nausea vomiting, jaundice, chest pain, palpitation, productive cough, gum bleeding, epistaxis, hematemesis, hemoptysis, abdominal pain, abdominal swelling, early satiety, melena, hematochezia, hematuria, skin rash, spontaneous bleeding, joint swelling, joint pain, heat or cold intolerance, bowel bladder  incontinence, back pain, focal motor weakness, paresthesia, depression, suicidal or homocidal ideation, feeling hopelessness.    Past Medical History  Diagnosis Date  . Hypertension   . Tracheostomy dependent   . Gastrostomy feeding   . History of laryngectomy   . Bronchitis   . 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  [name] Heart murmur     asymptomatic   . Leukocytopenia   . Asthma   . Sinusitis, chronic 07/20/2011  . Normal MRI 07/14/11    negative for mestasis      Past Surgical History  Procedure Date  . Portacath placement 09/17/10    Tip in cavoatrial junction  . Laryngectomy   . Tracheal dilitation 07/16/2011    Procedure: TRACHEAL DILITATION;  Surgeon: [name], MD;  Location: [facility] OR;  Service: ENT;  Laterality: N/A;  dilation of tracheal stoma and replacement of stoma tube    MEDICATIONS: Current Outpatient Prescriptions  Medication Sig Dispense Refill  . buPROPion (ZYBAN) 150 MG 12 hr tablet Take 150 mg by mouth daily.      [name] HYDROcodone-acetaminophen (NORCO) 5-325 MG per tablet Take 1-2 tablets by mouth every 4 (four) hours as needed.  60 tablet  0  . levothyroxine (SYNTHROID, LEVOTHROID) 100 MCG tablet Take 1 tablet (100 mcg total) by mouth daily before breakfast.  30 tablet  2  . lisinopril (PRINIVIL,ZESTRIL) 10 MG tablet Take 1 tablet (10 mg total) by mouth daily.  90 tablet  1  . metoprolol tartrate (LOPRESSOR) 25 MG tablet Take 1 tablet (25 mg total) by mouth 2 (two) times daily.  180 tablet  1  . nicotine (NICODERM CQ - DOSED IN MG/24 HOURS) 14 mg/24hr patch Place  1 patch onto the skin daily.          ALLERGIES:   has no known allergies.  REVIEW OF SYSTEMS:  The rest of the 14-point review of system was negative.   Filed Vitals:   07/24/11 1048  BP: 134/84  Pulse: 85  Temp: 98.5 F (36.9 C)   Wt Readings from Last 3 Encounters:  07/24/11 162 lb 6.4 oz (73.664 kg)  07/17/11 171 lb 15.3 oz (78 kg)    07/17/11 171 lb 15.3 oz (78 kg)   ECOG Performance status: 0  PHYSICAL EXAMINATION:   General:  well-nourished in no acute distress.  Eyes:  no scleral icterus.  ENT:  There were no oropharyngeal lesions.  Neck was without thyromegaly. Her neck was however fibrotic bilaterally. Her trach stoma was dry/clean/intact.   Lymphatics:  Negative cervical, supraclavicular or axillary adenopathy.  Respiratory: lungs were clear bilaterally without wheezing or crackles.  Cardiovascular:  Regular rate and rhythm, S1/S2, without murmur, rub or gallop.  There was no pedal edema.  GI:  abdomen was soft, flat, nontender, nondistended, without organomegaly.  Muscoloskeletal:  no spinal tenderness of palpation of vertebral spine.  Skin exam was without echymosis, petichae.  Neuro exam was nonfocal.  Patient was able to get on and off exam table without assistance.  Gait was normal.  Patient was alerted and oriented.  Attention was good.   Language was appropriate.  Mood was normal without depression.  Speech was not pressured.  Thought content was not tangential.    LABORATORY/RADIOLOGY DATA:  Lab Results  Component Value Date   WBC 2.9* 07/24/2011   HGB 10.9* 07/24/2011   HCT 33.3* 07/24/2011   PLT 275 07/24/2011   GLUCOSE 117 07/24/2011   CHOL 153 03/20/2011   TRIG 74 03/20/2011   HDL 53 03/20/2011   LDLCALC 85 03/20/2011   ALT 6 07/13/2011   AST 21 07/24/2011   NA 147* 07/24/2011   K 3.6 07/24/2011   CL 101 07/24/2011   CREATININE 1.1 07/24/2011   BUN 12 07/24/2011   CO2 30 07/24/2011   TSH 15.673* 07/24/2011   INR 1.01 10/05/2010   IMAGING:  I personally reviewed the CT neck.  There was post treatment changes; but no convincing evidence of recurrent disease.   Ct Head Wo Contrast  07/14/2011  *RADIOLOGY REPORT*  Clinical Data: The the patient reports episode of unresponsiveness. History of laryngeal cancer post surgery, chemotherapy, and radiation therapy.  CT HEAD WITHOUT CONTRAST  Technique:  Contiguous axial images were  obtained from the base of the skull through the vertex without contrast.  Comparison: 11/18/2010  Findings: The ventricles and sulci appear symmetrical.  No mass effect or midline shift.  No abnormal extra-axial fluid collections.  No ventricular dilatation.  Gray-white matter junctions are distinct.  Basal cisterns are not effaced.  No evidence of acute intracranial hemorrhage.  No depressed skull fractures.  Opacification of the left maxillary antrum with mucous membrane thickening in the right maxillary antrum, demonstrating progression since the previous study.  Otherwise, no change.  IMPRESSION: No evidence of acute intracranial hemorrhage, mass lesion, or acute infarct.  Note that contrast enhanced MRI would be more sensitive for detection of metastasis if this is clinically considered.  Original Report Authenticated By: Marlon Pel, M.D.   Ct Soft Tissue Neck W Contrast  07/14/2011  *RADIOLOGY REPORT*  Clinical Data: Laryngeal cancer.  Post surgery, chemotherapy and radiation therapy.  Tracheostomy September 2012.  Recent episode of inability  to clear the airway.  CT NECK WITH CONTRAST  Technique:  Multidetector CT imaging of the neck was performed with intravenous contrast.  Contrast: 75mL OMNIPAQUE IOHEXOL 300 MG/ML IV SOLN  Comparison: 01/17/2011.  Findings: Post laryngectomy and left thyroid lobe resection.  There is a radiopaque structure at the posterior margin of the lower cervical trachea imbedded in the posterior wall. Communication with the adjacent esophagus not excluded.  Clinical correlation recommended.  Diffuse thickening of the esophagus once again noted.  Mild thickening of the trachea below the tracheostomy site but without complete occlusion or coaptation membranes.  Diffuse infiltration of fat planes throughout the lower head, neck and upper chest.  Dense appearance of the submandibular glands. Findings are consistent with post radiation therapy changes with hyperenhancement of  the mucosal membranes throughout the pharynx in a symmetric fashion.  Since the prior examination, there has been any this increase in amount of prevertebral and parapharyngeal fluid.  Etiology indeterminate.  Scattered lymph nodes including a small left parotid lymph nodes and sub mental lymph nodes with minimal change since prior exam (most notable involving left submental lymph node series 3 image 49 although still containing a fatty center).  Opacification maxillary sinuses greater on the left.  Cervical spondylotic changes without bony destructive lesion.  The appearance of the left aspect of the mandible without change in this patient who is edentulous.  Aberrant right subclavian artery. Progressive apical parenchymal changes may represent result of radiation therapy.  Mediport catheter enters from the right.  The full extent is not imaged on the present exam.  IMPRESSION: Post laryngectomy and left thyroid lobe resection.  There is a radiopaque structure at the posterior margin of the lower cervical trachea imbedded in the posterior wall. Communication with the adjacent esophagus not excluded.  Clinical correlation recommended.  Diffuse thickening of the esophagus once again noted.  Mild thickening of the trachea below the tracheostomy site but without complete occlusion or coaptation membranes.  Diffuse infiltration of fat planes throughout the lower head, neck and upper chest.  Dense appearance of the submandibular glands. Findings are consistent with post radiation therapy changes with hyperenhancement of the mucosal membranes throughout the pharynx in a symmetric fashion.  Since the prior examination, there has been any this increase in amount of prevertebral and parapharyngeal fluid.  Etiology indeterminate.  Original Report Authenticated By: Fuller Canada, M.D.   Mr [name] Wo Contrast  07/15/2011  *RADIOLOGY REPORT*  Clinical Data: 56 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  New onset seizures.   Blackout.  MRI HEAD WITHOUT AND WITH CONTRAST  Technique:  Multiplanar, multiecho pulse sequences of the brain and surrounding structures were obtained according to standard protocol without and with intravenous contrast  Contrast: 15mL MULTIHANCE GADOBENATE DIMEGLUMINE 529 MG/ML IV SOLN  Comparison: Neck CT 07/14/2011 and earlier.  Head CT without contrast 11/18/2010.  Findings: The pituitary appears prominent but is within normal size limits for age and gender.  No discrete pituitary lesion.  No abnormal enhancement identified.  No restricted diffusion to suggest acute infarction.  No midline shift, mass effect, evidence of mass lesion, ventriculomegaly, extra-axial collection or acute intracranial hemorrhage.  Cervicomedullary junction within normal limits.  Wallace Cullens and white matter signal is within normal limits for age throughout the brain.  Medial temporal lobes, deep gray matter nuclei, brainstem, and cerebellum are within normal limits. Negative visualized cervical spine. Visualized bone marrow signal is within normal limits.  Major intracranial vascular flow voids are preserved; dominant distal left  vertebral artery is mildly tortuous and effaces the pontomedullary junction.  Insert stable orbits the mastoids are clear.  Grossly normal visualized internal auditory structures.  Paranasal sinus inflammatory changes including significant opacification of both maxillary sinuses.  Probable large mucous retention cyst on the left.  Negative scalp soft tissues.  IMPRESSION: No acute or metastatic intracranial abnormality.  Original Report Authenticated By: Harley Hallmark, M.D.   Dg Chest Port 1 View  07/14/2011  *RADIOLOGY REPORT*  Clinical Data: Chest pain, wheezing, shortness of breath  PORTABLE CHEST - 1 VIEW  Comparison: 07/13/2011  Findings: Shallow inspiration.  Normal heart size and pulmonary vascularity for technique.  Power port central venous catheter over the SVC / RA junction.  No focal airspace  consolidation.  No blunting of costophrenic angles.  No pneumothorax.  Extrapulmonary densities projected over the lower chest.  No significant change since previous study.  IMPRESSION: No evidence of active pulmonary disease.  Original Report Authenticated By: Marlon Pel, M.D.   ASSESSMENT AND PLAN:    1. History of larynx cancer: She is status post laryngectomy, status post adjuvant chemoradiation therapy. There is no evidence of disease recurrence or metastatic disease today on clinical history, physical exam, laboratory tests, imaging modality. I advised Ms. Allums that we will see her in 6 months myself with CT of the neck the day prior.  2. Leukocytopenia and anemia secondary to most likely inflammation from recent chemoradiation therapy. There is no active bleeding. There is no need for transfusion. Her CBC was better late Jan 2013.  She was worked up in the past and was negative for myeloma.  3. History of smoking: She no longer smokes now.  4. Hypertension: Well controlled on lisinopril, metoprolol per PCP. 5. 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 She is on levothyroxine.  Her TSH was elevated with depressed free T4.  Her dose of Synthroid was recently increased to 117mcg/day.  I will recheck her free T4 and TSH in 3 months in order to adjust her dose.   6. 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  7. Seizure:  She is off of Effexor now without seizure.  I advised her to refrain from driving for the next month to ensure that she does not recurrent seizure.  She reports no depression off of Effexor.  8. Follow up:  With me in clinic in about 6 months.  I encouraged her to follow up with Rad Onc and ENT as well.

## 2011-07-24 NOTE — Progress Notes (Signed)
Faxed referral to [facility] on [place] (fax [phone]) for PHT consult for decreased ROM/ fibrotic changes in pt's neck, per Dr. Gaylyn Rong.

## 2011-07-25 ENCOUNTER — Ambulatory Visit: Payer: Self-pay | Admitting: Oncology

## 2011-07-28 ENCOUNTER — Emergency Department (HOSPITAL_COMMUNITY)
Admission: EM | Admit: 2011-07-28 | Discharge: 2011-07-28 | Disposition: A | Discharge status: Home / Self Care | Payer: Medicaid Other | Attending: Emergency Medicine | Admitting: Emergency Medicine

## 2011-07-28 ENCOUNTER — Encounter (HOSPITAL_COMMUNITY): Payer: Self-pay | Admitting: Emergency Medicine

## 2011-07-28 DIAGNOSIS — Z930 Tracheostomy status: Secondary | ICD-10-CM | POA: Insufficient documentation

## 2011-07-28 DIAGNOSIS — R569 Unspecified convulsions: Secondary | ICD-10-CM | POA: Insufficient documentation

## 2011-07-28 DIAGNOSIS — Z931 Gastrostomy status: Secondary | ICD-10-CM | POA: Insufficient documentation

## 2011-07-28 DIAGNOSIS — I10 Essential (primary) hypertension: Secondary | ICD-10-CM | POA: Insufficient documentation

## 2011-07-28 DIAGNOSIS — J45909 Unspecified asthma, uncomplicated: Secondary | ICD-10-CM | POA: Insufficient documentation

## 2011-07-28 DIAGNOSIS — R011 Cardiac murmur, unspecified: Secondary | ICD-10-CM | POA: Insufficient documentation

## 2011-07-28 MED ORDER — WHITE PETROLATUM GEL
Status: AC
  Administered 2011-07-28: 18:00:00
  Filled 2011-07-28: qty 5

## 2011-07-28 MED ORDER — LORAZEPAM 2 MG/ML IJ SOLN
INTRAMUSCULAR | Status: AC
  Administered 2011-07-28: 1 mg via INTRAVENOUS
  Filled 2011-07-28: qty 1

## 2011-07-28 MED ORDER — LEVETIRACETAM 500 MG/5ML IV SOLN
1000.0000 mg | Freq: Once | INTRAVENOUS | Status: AC
  Administered 2011-07-28: 1000 mg via INTRAVENOUS
  Filled 2011-07-28: qty 10

## 2011-07-28 MED ORDER — LORAZEPAM 2 MG/ML IJ SOLN
1.0000 mg | Freq: Once | INTRAMUSCULAR | Status: AC
  Administered 2011-07-28: 1 mg via INTRAVENOUS

## 2011-07-28 MED ORDER — LEVETIRACETAM 500 MG PO TABS: 500.0000 mg | ORAL_TABLET | Freq: Two times a day (BID) | ORAL | Status: DC

## 2011-07-28 NOTE — ED Notes (Signed)
EDP at bedside with patient and family

## 2011-07-28 NOTE — ED Notes (Signed)
Witness seizure with lip smacking and right hand twitching and staring off for 30 sec with immediate return to alert and oriented status, witness a 2nd and 3rd seizure following lasting between 30 sec and 2 min.

## 2011-07-28 NOTE — ED Notes (Signed)
EMS reports patient had focal seizure with right hand twitching, lip smacking and zoning out, EMS reports patient 4 witness seizures total between home and enroute to hospital.

## 2011-07-28 NOTE — ED Provider Notes (Signed)
History     CSN: [id number]  Arrival date & time 07/28/11  1350   First MD Initiated Contact with Patient 07/28/11 1357      Chief Complaint  Patient presents with  . Seizures    (Consider location/radiation/quality/duration/timing/severity/associated sxs/prior treatment) HPI   Patient relates she was seen about 8 days ago when she was having some trouble breathing and she relates she has started having some seizures only in the past 2 or 3 weeks. She states she's had a CT, MRI and EEG done and was seen by a neurologist while in the hospital and states they don't know what's causing her seizures. Her boyfriend today states about 1:00 she came out of the bathroom and said "I just had another seizure". She then had a second one in he called EMS and after EMS arrived he relates she had another one. Nursing staff called me that she had been having seizure activity for 5 minutes and I walked in the room only a few seconds after hanging up with the nurse and the patient was alert and talking and giving me her history. Patient states she gets a warning that she is going have a seizure and she starts smacking her lips. Nurse relates patient was making her lips and her right hand was tremoring. 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  PCP medicines, family practice  Dr Armen Pickup ENT Dr. [name] Oncologist Dr. Gaylyn Rong  Past Medical History  Diagnosis Date  . Hypertension   . Tracheostomy dependent   . Gastrostomy feeding   . History of laryngectomy   . Bronchitis   . 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  [name] Heart murmur     asymptomatic   . Leukocytopenia   . Asthma   . Sinusitis, chronic 07/20/2011  . Normal MRI 07/14/11    negative for mestasis     Past Surgical History  Procedure Date  . Portacath placement 09/17/10    Tip in cavoatrial junction  . Laryngectomy   . Tracheal dilitation 07/16/2011   Procedure: TRACHEAL DILITATION;  Surgeon: [name], MD;  Location: [facility] OR;  Service: ENT;  Laterality: N/A;  dilation of tracheal stoma and replacement of stoma tube    Family History  Problem Relation Age of Onset  . Heart disease Mother   . Heart disease Father   . Heart disease Sister     History  Substance Use Topics  . Smoking status: Former Smoker    Types: Cigarettes    Quit date: 09/17/2010  . Smokeless tobacco: Not on file  . Alcohol Use: No     Former drinker 6 months ago   lives with boyfriend  OB History    Grav Para Term Preterm Abortions TAB SAB Ect Mult Living                  Review of Systems  All other systems reviewed and are negative.    Allergies  Review of patient's allergies indicates no known allergies.  Home Medications   Current Outpatient Rx  Name Route Sig Dispense Refill  . HYDROCODONE-ACETAMINOPHEN 5-325 MG PO TABS Oral Take 1-2 tablets by mouth every 4 (four) hours as needed. For pain    . LEVOTHYROXINE SODIUM 100 MCG PO TABS Oral Take 1 tablet (100 mcg total) by mouth daily before breakfast. 30 tablet 2  . LISINOPRIL 10 MG PO TABS Oral Take 1  tablet (10 mg total) by mouth daily. 90 tablet 1  . METOPROLOL TARTRATE 25 MG PO TABS Oral Take 1 tablet (25 mg total) by mouth 2 (two) times daily. 180 tablet 1  . NICOTINE 14 MG/24HR TD PT24 Transdermal Place 1 patch onto the skin daily.      [name] HYDROCODONE-ACETAMINOPHEN 5-325 MG PO TABS Oral Take 1-2 tablets by mouth every 4 (four) hours as needed. 60 tablet 0    BP 169/96  Pulse 81  Temp(Src) 98.5 F (36.9 C) (Oral)  SpO2 100%  Vital signs normal except hypertensive   Physical Exam  Nursing note and vitals reviewed. Constitutional: She is oriented to person, place, and time. She appears well-developed and well-nourished.  Non-toxic appearance. She does not appear ill. No distress.  HENT:  Head: Normocephalic and atraumatic.  Right Ear: External ear normal.  Left Ear: External ear  normal.  Nose: Nose normal. No mucosal edema or rhinorrhea.  Mouth/Throat: Oropharynx is clear and moist and mucous membranes are normal. No dental abscesses or uvula swelling.  Eyes: Conjunctivae and EOM are normal. Pupils are equal, round, and reactive to light.  Neck: Normal range of motion and full passive range of motion without pain. Neck supple.       Patient has a tracheostomy without hardware in place. She is noted to have diffuse redness and thickening of her skin which she states has been there for the past year.  Cardiovascular: Normal rate, regular rhythm and normal heart sounds.  Exam reveals no gallop and no friction rub.   No murmur heard. Pulmonary/Chest: Effort normal and breath sounds normal. No respiratory distress. She has no wheezes. She has no rhonchi. She has no rales. She exhibits no tenderness and no crepitus.  Abdominal: Soft. Normal appearance and bowel sounds are normal. She exhibits no distension. There is no tenderness. There is no rebound and no guarding.  Musculoskeletal: Normal range of motion. She exhibits no edema and no tenderness.       Moves all extremities well.   Neurological: She is alert and oriented to person, place, and time. She has normal strength. No cranial nerve deficit.       Patient had no seizure activity while I was in the room  Skin: Skin is warm, dry and intact. No rash noted. No erythema. No pallor.  Psychiatric: She has a normal mood and affect. Her speech is normal and behavior is normal. Her mood appears not anxious.    ED Course  Procedures (including critical care time)  Patient's discharge summary from January 20 was reviewed. They report her EEG was normal, and her MRI of the brain did not show any acute findings such as metastatic disease. Head CT was also done and had no acute findings  15:47 Dr Roseanne Reno, states to give keppra 1000 mg IV loading dose and send home on keppra 500 mg twice a day. Refer to Dr Denton Meek, neurology.    Pt given keppra 1000 mg IV. Pt informed of medications and follow up.   Diagnoses that have been ruled out:  None  Diagnoses that are still under consideration:  None  Final diagnoses:  Seizure, partial   New Prescriptions   LEVETIRACETAM (KEPPRA) 500 MG TABLET    Take 1 tablet (500 mg total) by mouth 2 (two) times daily.   Plan discharge Devoria Albe, MD, FACEP   MDM          Ward Givens, MD 07/28/11 [phone]

## 2011-07-29 ENCOUNTER — Telehealth: Payer: Self-pay | Admitting: Family Medicine

## 2011-07-29 NOTE — Telephone Encounter (Signed)
Called patient reviewed note from recent ED visit. Just calling to check up on patient. Advised f/u with neurology. Patient may call me with any questions or concerns.

## 2011-07-30 ENCOUNTER — Encounter: Payer: Self-pay | Admitting: Family Medicine

## 2011-07-30 ENCOUNTER — Ambulatory Visit (INDEPENDENT_AMBULATORY_CARE_PROVIDER_SITE_OTHER): Payer: Medicaid Other | Admitting: Family Medicine

## 2011-07-30 VITALS — BP 99/68 | HR 72 | Temp 98.9°F

## 2011-07-30 DIAGNOSIS — R569 Unspecified convulsions: Secondary | ICD-10-CM

## 2011-07-30 MED ORDER — HYDROCODONE-ACETAMINOPHEN 5-325 MG PO TABS: 1.0000 | ORAL_TABLET | ORAL | Status: DC | PRN

## 2011-07-30 NOTE — Patient Instructions (Signed)
Very nice to meet you. I am sorry I can't give you an answer but at least it's not anything alarming. We have done a very good workup and we do not see anything that could be causing the seizures at this time. It seems to me that Is helping so I when she to continue taking this medication. Please make an appointment with neurology in the next 2 weeks. I when she to come back and see Dr. Armen Pickup is next week.

## 2011-07-30 NOTE — Assessment & Plan Note (Signed)
Discussed at length with patient. Differential includes partial seizure disorder but workup has been negative. I think this is fairly unlikely the patient is improving with the Keppra at 500 mg twice daily for the last 2 days. Encourage her to continue the medication with her not having any side effects. Would consider even increasing to 1000 mg twice a day in 2 weeks time. Patient has # for neurology to followup with and told to make an appointment in the next 2 weeks. Patient is to followup with primary care provider in one week's time and we should consider talking to patient about pseudoseizures and see if there is any psychological aspect that could be occurring. Patient though was normal affect and had good judgment today.

## 2011-07-30 NOTE — Progress Notes (Signed)
  Subjective:    Patient ID: Deborah [name], female    DOB: [birth year], 56 y.o.   MRN: [id number]  HPI 56 year old female who has been recently admitted for potential seizures as well as an emergency apartment visit on Monday for continued seizures coming in 2 to seizures. Patient had a thorough workup inpatient for the seizures and all results were negative. Patient's CT of the head MRI of the head EEG were all negative. In addition this patient's blood results were within normal range except for her TSH which was significantly elevated and patient did change her Synthroid dose. Patient states she continues to have these seizures where she does have a lipsmacking repetitively and potentially arm movements. Patient though has not had many of them witnessed. There was one that was documented from the emergency department physician stating that there was no post ictal phase and patient was able to be alert and oriented within a minute of this potential seizure. Patient states that the duration as well as the frequency of the seizures have decreased since she was started on Keppra 2 days ago. Patient denies any side effects to this medication denies any fevers or chills or any weakness in any extremities. Patient though is concerned that she continues to have these problems. Patient states her last seizure was this morning while she was in bed but remembers everything. Denies any pain denies any over-the-counter medications other than what is on her medication list.   Review of Systems As stated above    Objective:   Physical Exam Nursing note and vitals reviewed. Constitutional: She is oriented to person, place, and time. She appears well-developed and well-nourished. No distress.  HENT: Patient does have a tracheostomy site that is healing well Head: Normocephalic and atraumatic.  Cardiovascular: Normal rate, regular rhythm and intact distal pulses.   Murmur heard. Systolic murmur is present with  a grade of 2/6  appears stable Pulmonary/Chest: Effort normal and breath sounds normal.  Abdominal: Soft. Bowel sounds are normal.  Musculoskeletal: Normal range of motion. She exhibits no edema and no tenderness.  Neurological: She is alert and oriented to person, place, and time. She has normal reflexes.  cranial nerves II through XII intact Skin: Skin is warm and dry.      Assessment & Plan:

## 2011-08-06 ENCOUNTER — Ambulatory Visit (INDEPENDENT_AMBULATORY_CARE_PROVIDER_SITE_OTHER): Payer: Medicaid Other | Admitting: Family Medicine

## 2011-08-06 ENCOUNTER — Encounter: Payer: Self-pay | Admitting: Family Medicine

## 2011-08-06 VITALS — BP 98/72 | HR 64 | Temp 98.5°F | Ht 67.0 in | Wt 158.9 lb

## 2011-08-06 DIAGNOSIS — R569 Unspecified convulsions: Secondary | ICD-10-CM

## 2011-08-06 DIAGNOSIS — IMO0002 Reserved for concepts with insufficient information to code with codable children: Secondary | ICD-10-CM

## 2011-08-06 LAB — BASIC METABOLIC PANEL
CO2: 29 mEq/L (ref 19–32)
Chloride: 103 mEq/L (ref 96–112)
Glucose, Bld: 69 mg/dL — ABNORMAL LOW (ref 70–99)
Sodium: 142 mEq/L (ref 135–145)

## 2011-08-06 NOTE — Patient Instructions (Signed)
Married acute to coming in today. Please continue to take the Keppra. Please be diligent about following up with neurology call them back on Friday if you do not hear anything. I would recommend calling your surgical oncologist regarding her stoma is better to dilated early to prevent serious complications.  Dr. Armen Pickup

## 2011-08-09 DIAGNOSIS — IMO0002 Reserved for concepts with insufficient information to code with codable children: Secondary | ICD-10-CM | POA: Insufficient documentation

## 2011-08-09 NOTE — Assessment & Plan Note (Addendum)
Patient reports pain at stoma site and feeling that it is narrowing. Plan for patient to schedule f/u with her surgical oncologist for stoma evaluation and pain control. She was taking percocet for pain if the pain is that severe her stoma will be best managed by her surgeon.

## 2011-08-09 NOTE — Assessment & Plan Note (Addendum)
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 Patient with decreased episodes and tolerating keppra. Plan to continue keppra and patient to schedule neurology follow up.

## 2011-08-09 NOTE — Progress Notes (Signed)
Subjective:     Patient ID: Deborah [name], female   DOB: [birth year], 56 y.o.   MRN: [id number]  HPI 56 yo F here to f/u seizures. She reports episodes of lip smacking. She denies episode this AM. She is compliant with Keppra and denies suicidal ideation. She has called for neurology f/u but has not yet been able to schedule an appointment.   Regarding stoma. She reports continued productive cough. She feels like her stoma is closing again. She reports continued pain at the site. She denies fever and chills.   Review of Systems As per HPI    Objective:   Physical Exam BP 98/72  Pulse 64  Temp(Src) 98.5 F (36.9 C) (Oral)  Ht 5\' 7"  (1.702 m)  Wt 158 lb 14.4 oz (72.077 kg)  BMI 24.89 kg/m2 General appearance: alert, cooperative and no distress Throat: laryngeal stoma. evidence of sclerosis and erythema. No swelling or flunctuance. Pink granulation tissue.  Neurologic: Alert and oriented X 3, normal strength and tone. Normal symmetric reflexes. Normal coordination and gait     Assessment:         Plan:

## 2011-08-27 ENCOUNTER — Encounter (HOSPITAL_COMMUNITY): Payer: Medicaid Other | Admitting: Dentistry

## 2011-09-18 ENCOUNTER — Encounter: Payer: Self-pay | Admitting: *Deleted

## 2011-09-18 DIAGNOSIS — Z923 Personal history of irradiation: Secondary | ICD-10-CM | POA: Insufficient documentation

## 2011-09-19 ENCOUNTER — Ambulatory Visit: Payer: Self-pay | Admitting: Radiation Oncology

## 2011-09-26 ENCOUNTER — Ambulatory Visit
Admission: RE | Admit: 2011-09-26 | Discharge: 2011-09-26 | Disposition: A | Discharge status: Home / Self Care | Payer: Medicaid Other | Source: Ambulatory Visit | Attending: Radiation Oncology | Admitting: Radiation Oncology

## 2011-09-26 ENCOUNTER — Encounter: Payer: Self-pay | Admitting: Radiation Oncology

## 2011-09-26 VITALS — BP 136/88 | HR 78 | Temp 98.5°F | Resp 20 | Ht 67.0 in | Wt 165.7 lb

## 2011-09-26 DIAGNOSIS — Z8589 Personal history of malignant neoplasm of other organs and systems: Secondary | ICD-10-CM | POA: Insufficient documentation

## 2011-09-26 DIAGNOSIS — C321 Malignant neoplasm of supraglottis: Secondary | ICD-10-CM

## 2011-09-26 NOTE — Progress Notes (Signed)
Pt occass takes Norco for "soreness of stoma". She states it is worse when it gets dry.  Denies pain in throat, mouth. Eating all foods but taste buds "come and go". Dry mouth. Appetite, energy good.

## 2011-09-26 NOTE — Progress Notes (Signed)
[facility] Radiation Oncology Follow up Note  Name: Deborah [name]   Date:   09/26/2011 MRN:  [id number] DOB: Jun 04, 1956   CC:  [name], MD, MD  [name], MD, Brynda Peon M.D.  DIAGNOSIS: Supraglottic larynx cancer, T4, N2, M0    ALLERGIES: Review of patient's allergies indicates no known allergies.   MEDICATIONS:  Current Outpatient Prescriptions  Medication Sig Dispense Refill  . HYDROcodone-acetaminophen (NORCO) 5-325 MG per tablet Take 1-2 tablets by mouth every 4 (four) hours as needed. For pain  30 tablet  0  . levETIRAcetam (KEPPRA) 500 MG tablet Take 1 tablet (500 mg total) by mouth 2 (two) times daily.  60 tablet  0  . levothyroxine (SYNTHROID, LEVOTHROID) 100 MCG tablet Take 1 tablet (100 mcg total) by mouth daily before breakfast.  30 tablet  2  . lisinopril (PRINIVIL,ZESTRIL) 10 MG tablet Take 1 tablet (10 mg total) by mouth daily.  90 tablet  1  . metoprolol tartrate (LOPRESSOR) 25 MG tablet Take 1 tablet (25 mg total) by mouth 2 (two) times daily.  180 tablet  1  . nicotine (NICODERM CQ - DOSED IN MG/24 HOURS) 14 mg/24hr patch Place 1 patch onto the skin daily.           NARRATIVE: The patient returns to clinic today for ongoing followup for her supraglottic laryngeal cancer. The patient states that she is doing better. She's had some difficulty with your attention of the stoma and some shortness of breath related to this. She states that she is doing much better. Dr. [name] had to clear this out to some degree and she states that this made a big difference. Her last CT scan of the neck did not show any evidence of recurrence   PHYSICAL EXAM:   height is 5\' 7"  (1.702 m) and weight is 165 lb 11.2 oz (75.161 kg). Her oral temperature is 98.5 F (36.9 C). Her blood pressure is 136/88 and her pulse is 78. Her respiration is 20.  Oral cavity clear The neck shows significant post surgical and post radiation change. Stoma site looks good today.  Significant hyperpigmentation and radiation fibrosis present bilaterally. Regular rate and rhythm Clear to auscultation bilaterally   LABORATORY DATA:  Lab Results  Component Value Date   WBC 2.9* 07/24/2011   HGB 10.9* 07/24/2011   HCT 33.3* 07/24/2011   MCV 90.2 07/24/2011   PLT 275 07/24/2011   Lab Results  Component Value Date   NA 142 08/06/2011   K 4.3 08/06/2011   CL 103 08/06/2011   CO2 29 08/06/2011   Lab Results  Component Value Date   ALT 6 07/13/2011   AST 21 07/24/2011   ALKPHOS 69 07/24/2011   BILITOT 0.50 07/24/2011         IMPRESSION: Pleasant 56 year old female doing well with no evidence of disease at this point    PLAN: Patient will return to clinic in 6 months for ongoing followup.   I spent 10 minutes minutes face to face with the patient and more than 50% of that time was spent in counseling and/or coordination of care.

## 2011-10-02 ENCOUNTER — Encounter (HOSPITAL_COMMUNITY): Payer: Self-pay

## 2011-10-02 ENCOUNTER — Encounter (HOSPITAL_COMMUNITY): Payer: Self-pay | Admitting: Anesthesiology

## 2011-10-02 ENCOUNTER — Emergency Department (HOSPITAL_COMMUNITY)
Admission: EM | Admit: 2011-10-02 | Discharge: 2011-10-02 | Disposition: A | Discharge status: Home / Self Care | Payer: Medicaid Other | Attending: Emergency Medicine | Admitting: Emergency Medicine

## 2011-10-02 ENCOUNTER — Encounter (HOSPITAL_COMMUNITY)
Admission: EM | Disposition: A | Discharge status: Home / Self Care | Payer: Self-pay | Source: Home / Self Care | Attending: Emergency Medicine

## 2011-10-02 ENCOUNTER — Emergency Department (HOSPITAL_COMMUNITY): Payer: Medicaid Other | Admitting: Anesthesiology

## 2011-10-02 ENCOUNTER — Emergency Department (HOSPITAL_COMMUNITY): Payer: Medicaid Other

## 2011-10-02 DIAGNOSIS — IMO0002 Reserved for concepts with insufficient information to code with codable children: Secondary | ICD-10-CM | POA: Insufficient documentation

## 2011-10-02 DIAGNOSIS — T17908A Unspecified foreign body in respiratory tract, part unspecified causing other injury, initial encounter: Secondary | ICD-10-CM

## 2011-10-02 DIAGNOSIS — R0682 Tachypnea, not elsewhere classified: Secondary | ICD-10-CM | POA: Insufficient documentation

## 2011-10-02 DIAGNOSIS — T17900A Unspecified foreign body in respiratory tract, part unspecified causing asphyxiation, initial encounter: Secondary | ICD-10-CM

## 2011-10-02 DIAGNOSIS — R61 Generalized hyperhidrosis: Secondary | ICD-10-CM | POA: Insufficient documentation

## 2011-10-02 DIAGNOSIS — R0602 Shortness of breath: Secondary | ICD-10-CM | POA: Insufficient documentation

## 2011-10-02 DIAGNOSIS — Z79899 Other long term (current) drug therapy: Secondary | ICD-10-CM | POA: Insufficient documentation

## 2011-10-02 DIAGNOSIS — R079 Chest pain, unspecified: Secondary | ICD-10-CM | POA: Insufficient documentation

## 2011-10-02 DIAGNOSIS — R1110 Vomiting, unspecified: Secondary | ICD-10-CM | POA: Insufficient documentation

## 2011-10-02 DIAGNOSIS — E079 Disorder of thyroid, unspecified: Secondary | ICD-10-CM | POA: Insufficient documentation

## 2011-10-02 DIAGNOSIS — R05 Cough: Secondary | ICD-10-CM | POA: Insufficient documentation

## 2011-10-02 DIAGNOSIS — T17308A Unspecified foreign body in larynx causing other injury, initial encounter: Secondary | ICD-10-CM | POA: Insufficient documentation

## 2011-10-02 DIAGNOSIS — I10 Essential (primary) hypertension: Secondary | ICD-10-CM | POA: Insufficient documentation

## 2011-10-02 DIAGNOSIS — G40909 Epilepsy, unspecified, not intractable, without status epilepticus: Secondary | ICD-10-CM | POA: Insufficient documentation

## 2011-10-02 DIAGNOSIS — R0989 Other specified symptoms and signs involving the circulatory and respiratory systems: Secondary | ICD-10-CM | POA: Insufficient documentation

## 2011-10-02 DIAGNOSIS — R0609 Other forms of dyspnea: Secondary | ICD-10-CM | POA: Insufficient documentation

## 2011-10-02 DIAGNOSIS — R059 Cough, unspecified: Secondary | ICD-10-CM | POA: Insufficient documentation

## 2011-10-02 DIAGNOSIS — R000 Tachycardia, unspecified: Secondary | ICD-10-CM | POA: Insufficient documentation

## 2011-10-02 HISTORY — PX: FOREIGN BODY REMOVAL BRONCHIAL: SHX5320

## 2011-10-02 SURGERY — REMOVAL, FOREIGN BODY, BRONCHUS
Anesthesia: General | Site: Throat | Wound class: Clean Contaminated

## 2011-10-02 MED ORDER — PROPOFOL 10 MG/ML IV EMUL
INTRAVENOUS | Status: DC | PRN
  Administered 2011-10-02: 20 mg via INTRAVENOUS

## 2011-10-02 MED ORDER — DEXAMETHASONE SODIUM PHOSPHATE 10 MG/ML IJ SOLN: 10.0000 mg | Freq: Once | INTRAMUSCULAR | Status: DC

## 2011-10-02 MED ORDER — MIDAZOLAM HCL 5 MG/5ML IJ SOLN
INTRAMUSCULAR | Status: DC | PRN
  Administered 2011-10-02: 2 mg via INTRAVENOUS

## 2011-10-02 MED ORDER — ROCURONIUM BROMIDE 50 MG/5ML IV SOLN
INTRAVENOUS | Status: AC
  Filled 2011-10-02: qty 2

## 2011-10-02 MED ORDER — SUCCINYLCHOLINE CHLORIDE 20 MG/ML IJ SOLN
INTRAMUSCULAR | Status: AC
  Filled 2011-10-02: qty 10

## 2011-10-02 MED ORDER — HYDROMORPHONE HCL PF 1 MG/ML IJ SOLN
0.2500 mg | INTRAMUSCULAR | Status: DC | PRN
  Administered 2011-10-02 (×2): 0.5 mg via INTRAVENOUS

## 2011-10-02 MED ORDER — RACEPINEPHRINE HCL 2.25 % IN NEBU: 0.5000 mL | INHALATION_SOLUTION | Freq: Once | RESPIRATORY_TRACT | Status: DC

## 2011-10-02 MED ORDER — 0.9 % SODIUM CHLORIDE (POUR BTL) OPTIME
TOPICAL | Status: DC | PRN
  Administered 2011-10-02: 1000 mL

## 2011-10-02 MED ORDER — ETOMIDATE 2 MG/ML IV SOLN
INTRAVENOUS | Status: AC
  Filled 2011-10-02: qty 20

## 2011-10-02 MED ORDER — LORAZEPAM 2 MG/ML IJ SOLN: 1.0000 mg | Freq: Once | INTRAMUSCULAR | Status: DC | PRN

## 2011-10-02 MED ORDER — LIDOCAINE HCL (CARDIAC) 20 MG/ML IV SOLN
INTRAVENOUS | Status: AC
  Filled 2011-10-02: qty 5

## 2011-10-02 MED ORDER — LACTATED RINGERS IV SOLN
INTRAVENOUS | Status: DC | PRN
  Administered 2011-10-02: 16:00:00 via INTRAVENOUS

## 2011-10-02 SURGICAL SUPPLY — 8 items
CONT SPEC 4OZ CLIKSEAL STRL BL (MISCELLANEOUS) ×2 IMPLANT
GAUZE SPONGE 4X4 16PLY XRAY LF (GAUZE/BANDAGES/DRESSINGS) ×2 IMPLANT
GLOVE SURG SS PI 6.5 STRL IVOR (GLOVE) ×2 IMPLANT
GLOVE SURG SS PI 7.5 STRL IVOR (GLOVE) ×2 IMPLANT
SOLUTION ANTI FOG 6CC (MISCELLANEOUS) ×2 IMPLANT
TOWEL OR NON WOVEN STRL DISP B (DISPOSABLE) ×2 IMPLANT
TUBE CONNECTING 12X1/4 (SUCTIONS) ×2 IMPLANT
TUBE FEEDING 8FR 16IN STR KANG (MISCELLANEOUS) ×2 IMPLANT

## 2011-10-02 NOTE — ED Notes (Signed)
Daughters name Marchelle Folks and phone [phone]

## 2011-10-02 NOTE — ED Notes (Addendum)
Pt in from home.  Pt was coughing and sucked down "prothesis" that allows pt to speak when covering stoma.  Through communication, pt states piece was placed approximately 2 weeks ago; pt's daughter told pt earlier this week it appeared as though that piece was loose.

## 2011-10-02 NOTE — ED Notes (Signed)
ENT MD in room to see pt; ENT MD had pt sign consent for OR procedure to remove foreign body from throat.

## 2011-10-02 NOTE — ED Provider Notes (Signed)
History     CSN: [id number]  Arrival date & time 10/02/11  1454   First MD Initiated Contact with Patient 10/02/11 1456      Chief Complaint  Patient presents with  . Airway Obstruction    (Consider location/radiation/quality/duration/timing/severity/associated sxs/prior treatment) Patient is a 56 y.o. female presenting with shortness of breath. The history is provided by the patient, the EMS personnel and a relative.  Shortness of Breath  The current episode started today. The problem occurs rarely. The problem has been rapidly worsening. The problem is severe. The symptoms are relieved by nothing. The symptoms are aggravated by nothing. Associated symptoms include chest pain, chest pressure, cough and shortness of breath. Pertinent negatives include no fever. The intake of a foreign body was witnessed. She has had prior hospitalizations. She has had prior ICU admissions. She has received no recent medical care.    Past Medical History  Diagnosis Date  . Hypertension   . Tracheostomy dependent   . Gastrostomy feeding   . History of laryngectomy   . Bronchitis   . 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  [name] Heart murmur     asymptomatic   . Leukocytopenia   . Asthma   . Sinusitis, chronic 07/20/2011  . Normal MRI 07/14/11    negative for mestasis   . Hx of radiation therapy 09/03/10 to 10/16/2010    supraglottic larynx  . Seizures     07/24/11 off Effexor w/o seizure  . 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 Portacath placement 09/17/10    Tip in cavoatrial junction  . Laryngectomy   . Tracheal dilitation 07/16/2011    Procedure: TRACHEAL DILITATION;  Surgeon: [name], MD;  Location: [facility] OR;  Service: ENT;  Laterality: N/A;  dilation of tracheal stoma and replacement of stoma tube    Family History  Problem Relation Age of Onset  . Heart disease Mother   . Heart disease Father    . Heart disease Sister     History  Substance Use Topics  . Smoking status: Former Smoker    Types: Cigarettes    Quit date: 09/17/2010  . Smokeless tobacco: Not on file  . Alcohol Use: No     Former drinker 6 months ago    OB History    Grav Para Term Preterm Abortions TAB SAB Ect Mult Living                  Review of Systems  Unable to perform ROS: Unstable vital signs  Constitutional: Negative for fever.  Respiratory: Positive for cough and shortness of breath.   Cardiovascular: Positive for chest pain.  Gastrointestinal: Positive for vomiting.    Allergies  Review of patient's allergies indicates no known allergies.  Home Medications   Current Outpatient Rx  Name Route Sig Dispense Refill  . HYDROCODONE-ACETAMINOPHEN 5-325 MG PO TABS Oral Take 1-2 tablets by mouth every 4 (four) hours as needed. For pain 30 tablet 0  . LEVETIRACETAM 500 MG PO TABS Oral Take 1 tablet (500 mg total) by mouth 2 (two) times daily. 60 tablet 0  . LEVOTHYROXINE SODIUM 100 MCG PO TABS Oral Take 1 tablet (100 mcg total) by mouth daily before breakfast. 30 tablet 2  . LISINOPRIL 10 MG PO TABS Oral Take 1 tablet (10 mg total) by mouth daily. 90 tablet 1  . METOPROLOL  TARTRATE 25 MG PO TABS Oral Take 1 tablet (25 mg total) by mouth 2 (two) times daily. 180 tablet 1  . NICOTINE 14 MG/24HR TD PT24 Transdermal Place 1 patch onto the skin daily.        BP 162/96  Pulse 108  Resp 26  SpO2 89%  Physical Exam  Nursing note and vitals reviewed. Constitutional: She is oriented to person, place, and time. She appears well-developed and well-nourished. She appears ill. She appears distressed.  HENT:  Head: Normocephalic and atraumatic.  Eyes: EOM are normal.  Neck: Normal range of motion.  Cardiovascular: Regular rhythm and normal heart sounds.  Tachycardia present.   Pulmonary/Chest: No stridor. Tachypnea noted. She is in respiratory distress. She has no wheezes. She has rales.  Abdominal:  Soft.  Musculoskeletal: Normal range of motion.  Neurological: She is alert and oriented to person, place, and time.  Skin: Skin is warm. She is diaphoretic.  Psychiatric: She has a normal mood and affect.    ED Course  Procedures (including critical care time)  Labs Reviewed - No data to display Dg Chest Portable 1 View  10/02/2011  *RADIOLOGY REPORT*  Clinical Data: Inhaled foreign body.  PORTABLE CHEST - 1 VIEW  Comparison: 07/14/2011.  Findings: Trachea is midline.  Heart size stable.  Right sided power port tip projects over the SVC.  Lungs are low in volume with new air space disease throughout the right lung.  No definite pleural fluid.  IMPRESSION: New air space disease throughout the right lung.  Original Report Authenticated By: Reyes Ivan, M.D.     1. Foreign body aspiration       MDM  Pt presents with dyspnea after inhaling part of her implanted voice box. Pt is s/p laryngectomy and had speaking valve implanted. After a cough spell, it went down her trachea.  Pt with BL breath sounds on initial eval, but pt in significant distress--tachycardic, hypoxic, tachypneic.  ENT consulted immediately, they recommended decadron and racemic epi.  Scope brought to bedside with plan to push object in further should patient signficantly decompensate.  CXR with R sided airspace disease, likely 2/2 obstruction.   ENT saw pt and will take to OR.        [name], MD 10/02/11 1620

## 2011-10-02 NOTE — Op Note (Signed)
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  ESTIMATED BLOOD LOSS: none  DRAINS: pediatric feeding tube in tracheoesophageal prosthesis SPECIMENS: tracheoesophageal prosthesis INDICATIONS: The patient is a 55yo with a history of laryngectomy in the past and placement of a voice/TEP prosthesis 2 weeks ago. She coughed today and aspirated the prosthesis into her right mainstem bronchus and had airway distress. DESCRIPTION OF OPERATION: The patient was brought to the operating room and was placed in the supine position and was placed under sedation by anesthesiology with spontaneous respirations. The trachea/laryngectomy stoma were topicalized with 4% lidocaine.   Rigid bronchoscopy was performed using the 0 degree 3mm hopkins rod. Inspissated secretions and the TEP prosthesis were noted wedged in the right mainstem bronchus causing complete obstruction. The straight suction was used to suction the secretions.  The hopkins rod was then placed into the optical foreign body forceps and the foreign body forceps were used to grasp the TEP prosthesis and remove it from the airway. Repeat bronchoscopy with the hopkins rod demonstrated that the carina and mainstem bronchi were now widely patent and free of any secretions, foreign bodies, or obstruction bilaterally. The trachea appeared normal. The TEP prosthesis was set aside and placed with the patient's chart and a pediatric feeding tube was placed in her TEP to stent this until her TEP prosthesis can be replaced.   The patient was turned back to anesthesia and awakened from anesthesia without difficulty. The  patient tolerated the procedure well with no immediate complications and was taken to the postoperative recovery area in good condition.   Dr. Melvenia Beam was present and performed the entire procedure. 10/02/11 4:17 PM Melvenia Beam

## 2011-10-02 NOTE — ED Notes (Signed)
RT arrived, ED MD in room, 2 RNs in room

## 2011-10-02 NOTE — Anesthesia Preprocedure Evaluation (Signed)
Anesthesia Evaluation  Patient identified by MRN, date of birth, ID band Patient awake    Reviewed: Allergy & Precautions, H&P , NPO status , Patient's Chart, lab work & pertinent test results  Airway       Dental   Pulmonary shortness of breath, asthma , COPD S/p laryngectomy w/foreign body lodged in r main stem         Cardiovascular hypertension,     Neuro/Psych Seizures -,     GI/Hepatic   Endo/Other  Hypothyroidism   Renal/GU      Musculoskeletal   Abdominal   Peds  Hematology   Anesthesia Other Findings Pt uncoop due to distress SaO2 70 on 100% O2 Trach stoma  Reproductive/Obstetrics                           Anesthesia Physical Anesthesia Plan  ASA: IV and Emergent  Anesthesia Plan: General   Post-op Pain Management:    Induction: Intravenous  Airway Management Planned: Oral ETT  Additional Equipment:   Intra-op Plan:   Post-operative Plan: Extubation in OR  Informed Consent: I have reviewed the patients History and Physical, chart, labs and discussed the procedure including the risks, benefits and alternatives for the proposed anesthesia with the patient or authorized representative who has indicated his/her understanding and acceptance.     Plan Discussed with: CRNA and Surgeon  Anesthesia Plan Comments:         Anesthesia Quick Evaluation

## 2011-10-02 NOTE — ED Notes (Signed)
Family at bedside. 

## 2011-10-02 NOTE — H&P (Signed)
10/02/11  Deborah [name]  PREOPERATIVE HISTORY AND PHYSICAL  CHIEF COMPLAINT: right mainstem foreign body  HISTORY: This is a 56 year old who presents with dyspnea after her laryngectomy TEP fell into her stoma.  She now presents for bronchoscopy with foreign body removal.  Dr. Emeline Darling, [name] has discussed the risks, benefits, and alternatives of this procedure. The patient understands the risks and would like to proceed with the procedure. The chances of success of the procedure are >50% and the patient understands this. I personally performed an examination of the patient within 24 hours of the procedure.  PAST MEDICAL HISTORY: Past Medical History  Diagnosis Date  . Hypertension   . Tracheostomy dependent   . Gastrostomy feeding   . History of laryngectomy   . Bronchitis   . 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  [name] Heart murmur     asymptomatic   . Leukocytopenia   . Asthma   . Sinusitis, chronic 07/20/2011  . Normal MRI 07/14/11    negative for mestasis   . Hx of radiation therapy 09/03/10 to 10/16/2010    supraglottic larynx  . Seizures     07/24/11 off Effexor w/o seizure  . Thyroid disease     hypo due to radiation    PAST SURGICAL HISTORY: Past Surgical History  Procedure Date  . Portacath placement 09/17/10    Tip in cavoatrial junction  . Laryngectomy   . Tracheal dilitation 07/16/2011    Procedure: TRACHEAL DILITATION;  Surgeon: [name], MD;  Location: [facility] OR;  Service: ENT;  Laterality: N/A;  dilation of tracheal stoma and replacement of stoma tube    MEDICATIONS: Current facility-administered medications:dexamethasone (DECADRON) injection 10 mg, 10 mg, Intravenous, Once, [name], MD;  Racepinephrine HCl 2.25 % nebulizer solution 0.5 mL, 0.5 mL, Nebulization, Once, [name], MD Current outpatient prescriptions:HYDROcodone-acetaminophen (NORCO) 5-325 MG per tablet, Take 1-2 tablets by mouth every 4  (four) hours as needed. For pain, Disp: 30 tablet, Rfl: 0;  levETIRAcetam (KEPPRA) 500 MG tablet, Take 1 tablet (500 mg total) by mouth 2 (two) times daily., Disp: 60 tablet, Rfl: 0;  levothyroxine (SYNTHROID, LEVOTHROID) 100 MCG tablet, Take 1 tablet (100 mcg total) by mouth daily before breakfast., Disp: 30 tablet, Rfl: 2 lisinopril (PRINIVIL,ZESTRIL) 10 MG tablet, Take 1 tablet (10 mg total) by mouth daily., Disp: 90 tablet, Rfl: 1;  metoprolol tartrate (LOPRESSOR) 25 MG tablet, Take 1 tablet (25 mg total) by mouth 2 (two) times daily., Disp: 180 tablet, Rfl: 1;  nicotine (NICODERM CQ - DOSED IN MG/24 HOURS) 14 mg/24hr patch, Place 1 patch onto the skin daily.  , Disp: , Rfl:   ALLERGIES: No Known Allergies   SOCIAL HISTORY: History   Social History  . Marital Status: Divorced    Spouse Name: N/A    Number of Children: N/A  . Years of Education: 12   Occupational History  . Not on file.   Social History Main Topics  . Smoking status: Former Smoker    Types: Cigarettes    Quit date: 09/17/2010  . Smokeless tobacco: Not on file  . Alcohol Use: No     Former drinker 6 months ago  . Drug Use: No     Former cocaine user 6 month ago  . Sexually Active: Yes    Birth Control/ Protection: Surgical   Other Topics Concern  . Not on file   Social History Narrative   Unemployed. Receives SSI. Lives with  one of her 2 adult daughters Deborah [name]) and her sister Deborah [name])    FAMILY HISTORY: Family History  Problem Relation Age of Onset  . Heart disease Mother   . Heart disease Father   . Heart disease Sister     REVIEW OF SYSTEMS: dyspnea, otherwise negative x 10 systems   PHYSICAL EXAM:  GENERAL:  Uncomfortable but awake, alert VITAL SIGNS:   Filed Vitals:   10/02/11 1507  BP: 162/96  Pulse: 108  Resp: 26   SKIN:  Warm, dry HEENT:  Laryngectomy stoma is seen with crusting, small stoma NECK:  See above LYMPH:  No LAD LUNGS:  Decreased on  right CARDIOVASCULAR:  tachycardic ABDOMEN:  soft MUSCULOSKELETAL: normal PSYCH:  normal NEUROLOGIC:  normal  DIAGNOSTIC STUDIES: CXR with right lobe infiltrate and hypoinflation c/w right mainstem foreign body  ASSESSMENT AND PLAN: Plan to proceed with bronchoscopy with foreign body removal. Patient understands the risks, benefits, and alternatives. INformed consent signed and on chart. 10/02/11 3:33 PM Deborah [name]

## 2011-10-02 NOTE — Preoperative (Signed)
Beta Blockers   Reason not to administer Beta Blockers:Not Applicable 

## 2011-10-02 NOTE — ED Notes (Signed)
OR called to find out status of taking pt to OR.

## 2011-10-02 NOTE — ED Notes (Signed)
Pt taken to OR by 2 RNs and RT.

## 2011-10-02 NOTE — Discharge Instructions (Signed)
Follow up with Dr. [name] as scheduled. Call [facility] speech pathology to be seen to have your voice/TEP prosthesis replaced as soon as possible. You can eat a regular diet as tolerated. Leave the feeding tube in your tracheoesophageal puncture in place until speech pathology replaces your TEP. Call ENT on call if you have increased difficulty breathing. Take your TEP prosthesis home with you so that speech pathology can replace it. You can use OTC tylenol or ibuprofen liquid PRN pain.

## 2011-10-02 NOTE — Anesthesia Postprocedure Evaluation (Signed)
  Anesthesia Post-op Note  Patient: Deborah [name]  Procedure(s) Performed: Procedure(s) (LRB): REMOVAL FOREIGN BODY BRONCHIAL (N/A)  Patient Location: PACU  Anesthesia Type: General  Level of Consciousness: awake  Airway and Oxygen Therapy: Patient Spontanous Breathing  Post-op Pain: mild  Post-op Assessment: Post-op Vital signs reviewed, Patient's Cardiovascular Status Stable, Respiratory Function Stable, Patent Airway, No signs of Nausea or vomiting and Pain level controlled  Post-op Vital Signs: stable  Complications: No apparent anesthesia complications

## 2011-10-02 NOTE — ED Provider Notes (Signed)
Pt presents with upper airway obstruction.  She has a prosthesis implanted associated with her tracheostomy.  Pt is able to maintain her oxygen in the low 90s.  Oxygen supplementation has been added.  No obvious obstruction viewed when looking down her stoma.  ENT has been consulted for possible OR removal of the obstruction.  Pt sees Dr [name] and his service has been contacted.  I saw and evaluated the patient, reviewed the resident's note and I agree with the findings and plan.   Celene Kras, MD 10/02/11 [phone]

## 2011-10-02 NOTE — Transfer of Care (Signed)
Immediate Anesthesia Transfer of Care Note  Patient: Deborah [name]  Procedure(s) Performed: Procedure(s) (LRB): REMOVAL FOREIGN BODY BRONCHIAL (N/A)  Patient Location: PACU  Anesthesia Type: MAC  Level of Consciousness: awake, alert  and oriented  Airway & Oxygen Therapy: Patient Spontanous Breathing and Patient connected to face mask oxygen  Post-op Assessment: Report given to PACU RN and Post -op Vital signs reviewed and stable  Post vital signs: Reviewed and stable  Complications: No apparent anesthesia complications

## 2011-10-02 NOTE — Progress Notes (Signed)
While giving d/c instructions, pt's daughter was concerned about whether pt needed to put in apparatus that "keeps stoma open" at [facility]; this is pt's usual routine. Currently pediatric feeding tube is in stoma, placed by Dr Emeline Darling to maintain stoma. RN called Dr Emeline Darling for clarification: Keep feeding tube in until seen by Dr [name] in office or at [facility] tomorrow. Do not insert apparatus tonight; this may cause further swelling. Discussed this with pt and family. Pt was comfortable with the instructions given and going home. Pt did not have any further questions/concerns about d/c instructions.

## 2011-10-03 ENCOUNTER — Encounter (HOSPITAL_COMMUNITY): Payer: Self-pay | Admitting: Otolaryngology

## 2011-10-22 ENCOUNTER — Other Ambulatory Visit (HOSPITAL_BASED_OUTPATIENT_CLINIC_OR_DEPARTMENT_OTHER): Payer: Medicaid Other | Admitting: Lab

## 2011-10-22 DIAGNOSIS — E039 Hypothyroidism, unspecified: Secondary | ICD-10-CM

## 2011-10-22 DIAGNOSIS — Z8521 Personal history of malignant neoplasm of larynx: Secondary | ICD-10-CM

## 2011-10-23 ENCOUNTER — Other Ambulatory Visit: Payer: Self-pay | Admitting: Oncology

## 2011-10-23 ENCOUNTER — Telehealth: Payer: Self-pay

## 2011-10-23 NOTE — Telephone Encounter (Signed)
Message copied by Kallie Locks on Thu Oct 23, 2011  2:57 PM ------      Message from: HA, Raliegh Ip T      Created: Thu Oct 23, 2011  8:41 AM       Please contact patient to confirm that she is still adherent to taking Synthroid PO daily.  If this is the case; no change in current dose.  Thanks.

## 2011-11-03 ENCOUNTER — Encounter (HOSPITAL_COMMUNITY): Payer: Self-pay | Admitting: Dentistry

## 2011-11-03 ENCOUNTER — Ambulatory Visit (HOSPITAL_COMMUNITY): Payer: Self-pay | Admitting: Dentistry

## 2011-11-03 VITALS — BP 142/82 | HR 84 | Temp 97.5°F

## 2011-11-03 DIAGNOSIS — R1310 Dysphagia, unspecified: Secondary | ICD-10-CM

## 2011-11-03 DIAGNOSIS — K08109 Complete loss of teeth, unspecified cause, unspecified class: Secondary | ICD-10-CM

## 2011-11-03 DIAGNOSIS — Z463 Encounter for fitting and adjustment of dental prosthetic device: Secondary | ICD-10-CM

## 2011-11-03 NOTE — Progress Notes (Signed)
11/03/2011  Patient:            Deborah [name] Date of Birth:  [birth year] MRN:                [id number]  BP 142/82  Pulse 84  Temp(Src) 97.5 F (36.4 C) (Oral)  DIANNAH RINDFLEISCH is a 56 year old female that presents for periodic oral exam and evaluation of dentures. Premedication: None required. Medical Hx Update:  Past Medical History  Diagnosis Date  . Hypertension   . Tracheostomy dependent   . Gastrostomy feeding   . History of laryngectomy   . Bronchitis   . 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  [name] Heart murmur     asymptomatic   . Leukocytopenia   . Asthma   . Sinusitis, chronic 07/20/2011  . Normal MRI 07/14/11    negative for mestasis   . Hx of radiation therapy 09/03/10 to 10/16/2010    supraglottic larynx  . Seizures     07/24/11 off Effexor w/o seizure  . 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 ALLERGIES/ADVERSE DRUG REACTIONS: No Known Allergies MEDICATIONS: Current Outpatient Prescriptions  Medication Sig Dispense Refill  . HYDROcodone-acetaminophen (NORCO) 5-325 MG per tablet Take 1-2 tablets by mouth every 4 (four) hours as needed. For pain  30 tablet  0  . levETIRAcetam (KEPPRA) 500 MG tablet Take 1 tablet (500 mg total) by mouth 2 (two) times daily.  60 tablet  0  . levothyroxine (SYNTHROID, LEVOTHROID) 100 MCG tablet Take 1 tablet (100 mcg total) by mouth daily before breakfast.  30 tablet  2  . lisinopril (PRINIVIL,ZESTRIL) 10 MG tablet Take 1 tablet (10 mg total) by mouth daily.  90 tablet  1  . metoprolol tartrate (LOPRESSOR) 25 MG tablet Take 1 tablet (25 mg total) by mouth 2 (two) times daily.  180 tablet  1  . nicotine (NICODERM CQ - DOSED IN MG/24 HOURS) 14 mg/24hr patch Place 1 patch onto the skin daily.          C/C: Patient presents for periodic oral examination and evaluation of dentures   HPI:   Patient had upper and  lower complete dentures inserted on 02/27/2011. Patient now presents  for periodic oral examination and evaluation of the upper and lower complete dentures. Patient denies having any denture irritation or problems with dentures. Patient does have a problem with swallowing at times.   DENTAL EXAM: General: Patient is a well-developed, well-nourished female in no acute distress. Vitals: As above. Extraoral Exam:  There is no palpable lymphadenopathy. There are no TMJ Symptoms. The stoma is still in place. Intraoral  Exam: Patient has Normal Saliva.   there are no soft tissue lesions, denture irritation or erythema. Dentition: Patient is edentulous. Prosthodontic: patient has upper and lower complete dentures. Dentures are stable and retentive. Pressure indicating paste was applied and dentures were adjusted as needed. Dentures were polished. Occlusion:  The denture occlusion is acceptable. Minimal adjustments required.  Assessments: 1. Patient is edentulous. 2. The dentures are stable and retentive. Patient did not wish to proceed with upper lower complete denture relines at this time. 3. Dysphagia-recommended followup with Dr. Mitzi Hansen or Dr. [name] as indicated.  Plan:  1. Patient to followup with dental medicine as scheduled. Patient to call if denture adjustment as needed before then. 2. I suggested that the patient followup with Dr. [name] or Dr. Mitzi Hansen for evaluation of dysphagia.  However, this most likely represents a post-radiation therapy problem that will hopefully resolve over time.   Charlynne Pander, DDS

## 2011-12-03 ENCOUNTER — Other Ambulatory Visit: Payer: Self-pay | Admitting: *Deleted

## 2011-12-03 MED ORDER — LEVOTHYROXINE SODIUM 100 MCG PO TABS: 100.0000 ug | ORAL_TABLET | Freq: Every day | ORAL | Status: DC

## 2011-12-03 NOTE — Telephone Encounter (Signed)
Patient in office requesting refill on thyroid medication. Will forward to MD.

## 2012-01-20 ENCOUNTER — Ambulatory Visit (HOSPITAL_COMMUNITY)
Admission: RE | Admit: 2012-01-20 | Discharge: 2012-01-20 | Disposition: A | Discharge status: Home / Self Care | Payer: Medicaid Other | Source: Ambulatory Visit | Attending: Oncology | Admitting: Oncology

## 2012-01-20 ENCOUNTER — Telehealth: Payer: Self-pay | Admitting: *Deleted

## 2012-01-20 ENCOUNTER — Other Ambulatory Visit (HOSPITAL_BASED_OUTPATIENT_CLINIC_OR_DEPARTMENT_OTHER): Payer: Medicaid Other | Admitting: Lab

## 2012-01-20 ENCOUNTER — Other Ambulatory Visit: Payer: Self-pay | Admitting: Family Medicine

## 2012-01-20 ENCOUNTER — Encounter: Payer: Self-pay | Admitting: Family Medicine

## 2012-01-20 DIAGNOSIS — C329 Malignant neoplasm of larynx, unspecified: Secondary | ICD-10-CM | POA: Insufficient documentation

## 2012-01-20 DIAGNOSIS — Z923 Personal history of irradiation: Secondary | ICD-10-CM | POA: Insufficient documentation

## 2012-01-20 DIAGNOSIS — G8929 Other chronic pain: Secondary | ICD-10-CM | POA: Insufficient documentation

## 2012-01-20 DIAGNOSIS — Z9221 Personal history of antineoplastic chemotherapy: Secondary | ICD-10-CM | POA: Insufficient documentation

## 2012-01-20 DIAGNOSIS — E039 Hypothyroidism, unspecified: Secondary | ICD-10-CM

## 2012-01-20 DIAGNOSIS — Z8521 Personal history of malignant neoplasm of larynx: Secondary | ICD-10-CM

## 2012-01-20 DIAGNOSIS — Z9089 Acquired absence of other organs: Secondary | ICD-10-CM | POA: Insufficient documentation

## 2012-01-20 DIAGNOSIS — Z930 Tracheostomy status: Secondary | ICD-10-CM | POA: Insufficient documentation

## 2012-01-20 DIAGNOSIS — M542 Cervicalgia: Secondary | ICD-10-CM | POA: Insufficient documentation

## 2012-01-20 HISTORY — DX: Other chronic pain: G89.29

## 2012-01-20 LAB — CMP (CANCER CENTER ONLY)
ALT(SGPT): 16 U/L (ref 10–47)
Alkaline Phosphatase: 84 U/L (ref 26–84)
BUN, Bld: 18 mg/dL (ref 7–22)
Glucose, Bld: 97 mg/dL (ref 73–118)
Potassium: 4.3 mEq/L (ref 3.3–4.7)
Total Protein: 8.7 g/dL — ABNORMAL HIGH (ref 6.4–8.1)

## 2012-01-20 LAB — CBC WITH DIFFERENTIAL/PLATELET
BASO%: 1.3 % (ref 0.0–2.0)
HCT: 34.7 % — ABNORMAL LOW (ref 34.8–46.6)
LYMPH%: 17.8 % (ref 14.0–49.7)
MCH: 30 pg (ref 25.1–34.0)
MCHC: 33 g/dL (ref 31.5–36.0)
MONO#: 0.3 10*3/uL (ref 0.1–0.9)
NEUT%: 65.3 % (ref 38.4–76.8)
Platelets: 298 10*3/uL (ref 145–400)
WBC: 5.1 10*3/uL (ref 3.9–10.3)

## 2012-01-20 MED ORDER — IOHEXOL 300 MG/ML  SOLN
100.0000 mL | Freq: Once | INTRAMUSCULAR | Status: AC | PRN
  Administered 2012-01-20: 100 mL via INTRAVENOUS

## 2012-01-20 NOTE — Telephone Encounter (Signed)
LMOVM for pt to give [place] a callback.  There is a pain clinic referral for her but Barney Drain is on her medicaid card, therefore we are not authorized to send for referrals.  Pt will need to call her social worker and have this changed.  Per Lupita Leash the change does not happen till the first of every month, so if she gets this changed we will not be able to view the change till sept 1st. Mudlogger, Maryjo Rochester

## 2012-01-20 NOTE — Assessment & Plan Note (Signed)
Pain management referral made for chronic pain. Patient seeking refills on pain medication for stoma that is stable, she is now cancer free.

## 2012-01-20 NOTE — Addendum Note (Signed)
Addended by: [name] on: 01/20/2012 02:51 PM   Modules accepted: Orders

## 2012-01-20 NOTE — Telephone Encounter (Signed)
Patient is calling because she called her case worker to have her Medicaid Card changed, but they wanted to speak to someone from our office and she wants Shanda Bumps to call [phone].

## 2012-01-21 ENCOUNTER — Encounter: Payer: Self-pay | Admitting: Oncology

## 2012-01-21 ENCOUNTER — Telehealth: Payer: Self-pay | Admitting: *Deleted

## 2012-01-21 ENCOUNTER — Ambulatory Visit (HOSPITAL_BASED_OUTPATIENT_CLINIC_OR_DEPARTMENT_OTHER): Payer: Medicaid Other | Admitting: Oncology

## 2012-01-21 VITALS — BP 137/86 | HR 107 | Temp 98.0°F | Resp 18 | Ht 67.0 in | Wt 168.4 lb

## 2012-01-21 DIAGNOSIS — D649 Anemia, unspecified: Secondary | ICD-10-CM

## 2012-01-21 DIAGNOSIS — C321 Malignant neoplasm of supraglottis: Secondary | ICD-10-CM

## 2012-01-21 DIAGNOSIS — M542 Cervicalgia: Secondary | ICD-10-CM | POA: Insufficient documentation

## 2012-01-21 DIAGNOSIS — E039 Hypothyroidism, unspecified: Secondary | ICD-10-CM

## 2012-01-21 HISTORY — DX: Cervicalgia: M54.2

## 2012-01-21 MED ORDER — HYDROCODONE-ACETAMINOPHEN 5-325 MG PO TABS: 1.0000 | ORAL_TABLET | Freq: Three times a day (TID) | ORAL | Status: DC | PRN

## 2012-01-21 MED ORDER — LEVOTHYROXINE SODIUM 150 MCG PO TABS: 150.0000 ug | ORAL_TABLET | Freq: Every day | ORAL | Status: DC

## 2012-01-21 NOTE — Telephone Encounter (Signed)
Contacted case worker and she thinks that she changed it (she states that the system is "finicky") but she would like for [place] to call again tomorrow to check on that. Fleeger, Maryjo Rochester

## 2012-01-21 NOTE — Telephone Encounter (Signed)
Gave patient appointment for 03-22-2012 05-22-2012 20-12-2012 labs and ct neck and 07-26-2012 midlevel

## 2012-01-21 NOTE — Progress Notes (Signed)
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   PAST THERAPY: total laryngectomy and bilateral neck dissection on 07/31/2010. She received adjuvant chemoXRT with daily XRT and weekly cisplatin between 09/03/2010 and 10/01/2010. Chemotherapy ended two weeks prior to XRT due to poor toleration and dose limiting toxicity. Radiation concluded on 10/16/2010.   CURRENT THERAPY: watchful observation.   INTERVAL HISTORY: Deborah [name] 56 y.o. female returns for regular follow up by herself.  She reports feeling relatively well.  She aspirated her prosthetic device in 09/2011 and required a bronchoscopy to extract it.  She thinks that her trach is slowly decreasing in size on her. She has burning sensation when she places the trach prosthetic at night in the trach stoma.  She has been having posterior neck pain throughout the day for the past 3 months. The back pain is moderate. The pain does not resolve with pain medication over-the-counter such as Tylenol or ibuprofen. There is no obvious inciting factors for neck pain.  She denies bilateral arm weakness or numbness and tingling despite neck pain. There is no exacerbating or relieving factors except for taking Lortab. She no longer smokes cigarette, uses tobacco or drinks alcohol.  Patient denies fever, anorexia, weight loss, headache, visual changes, confusion, drenching night sweats, palpable lymph node swelling, mucositis, odynophagia, dysphagia, nausea vomiting, jaundice, chest pain, palpitation, shortness of breath, dyspnea on exertion, productive cough, gum bleeding, epistaxis, hematemesis, hemoptysis, abdominal pain, abdominal swelling, early satiety, melena, hematochezia,  hematuria, skin rash, spontaneous bleeding, joint swelling, heat or cold intolerance, bowel bladder incontinence, back pain, focal motor weakness, paresthesia, depression, suicidal or homicidal ideation, feeling hopelessness.   Past Medical History  Diagnosis Date  . Hypertension   . Tracheostomy dependent   . Gastrostomy feeding   . History of laryngectomy   . Bronchitis   . 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  [name] Heart murmur     asymptomatic   . Leukocytopenia   . Asthma   . Sinusitis, chronic 07/20/2011  . Normal MRI 07/14/11    negative for mestasis   . Hx of radiation therapy 09/03/10 to 10/16/2010    supraglottic larynx  . Seizures     07/24/11 off Effexor w/o seizure  . 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 Neck pain 01/21/2012    Past Surgical History  Procedure Date  . Portacath placement 09/17/10    Tip in cavoatrial junction  . Laryngectomy   . Tracheal dilitation 07/16/2011    Procedure: TRACHEAL DILITATION;  Surgeon: [name], MD;  Location: [facility] OR;  Service: ENT;  Laterality: N/A;  dilation of tracheal stoma and replacement of stoma tube  . Foreign body removal bronchial 10/02/2011    Procedure: REMOVAL FOREIGN BODY BRONCHIAL;  Surgeon: Melvenia Beam, MD;  Location: [facility] OR;  Service: ENT;  Laterality: N/A;    Current Outpatient Prescriptions  Medication Sig Dispense Refill  . HYDROcodone-acetaminophen (NORCO/VICODIN) 5-325 MG per tablet Take 1 tablet by mouth every 8 (eight) hours as needed for pain. For pain  90 tablet  0  . levothyroxine (SYNTHROID, LEVOTHROID) 150 MCG tablet Take 1 tablet (150 mcg total) by mouth daily before breakfast.  30 tablet  2  .  lisinopril (PRINIVIL,ZESTRIL) 10 MG tablet Take 1 tablet (10 mg total) by mouth daily.  90 tablet  1  . metoprolol tartrate (LOPRESSOR) 25 MG tablet Take 1 tablet (25 mg total) by mouth 2 (two) times daily.  180 tablet  1  . DISCONTD: levothyroxine (SYNTHROID,  LEVOTHROID) 100 MCG tablet Take 1 tablet (100 mcg total) by mouth daily before breakfast.  30 tablet  2    ALLERGIES:   has no known allergies.  REVIEW OF SYSTEMS:  The rest of the 14-point review of system was negative.   Filed Vitals:   01/21/12 0912  BP: 137/86  Pulse: 107  Temp: 98 F (36.7 C)  Resp: 18   Wt Readings from Last 3 Encounters:  01/21/12 168 lb 6.4 oz (76.386 kg)  09/26/11 165 lb 11.2 oz (75.161 kg)  08/06/11 158 lb 14.4 oz (72.077 kg)   ECOG Performance status: 1  PHYSICAL EXAMINATION:   General: well-nourished woman, in no acute distress. Eyes: no scleral icterus. ENT: There were no oropharyngeal lesions. Neck was without thyromegaly. Her neck was however fibrotic bilaterally. Her trach stoma was dry/clean/intact. Lymphatics: Negative cervical, supraclavicular or axillary adenopathy. Respiratory: lungs were clear bilaterally without wheezing or crackles. Cardiovascular: Regular rate and rhythm, S1/S2, without murmur, rub or gallop. There was no pedal edema. GI: abdomen was soft, flat, nontender, nondistended, without organomegaly. Muscoloskeletal: no spinal tenderness of palpation of vertebral spine especially cervical spine.  Skin exam was without echymosis, petichae. Neuro exam was nonfocal. Patient was able to get on and off exam table without assistance. Gait was normal. Patient was alerted and oriented. Attention was good. Language was appropriate. Mood was normal without depression. Speech was not pressured. Thought content was not tangential.    LABORATORY/RADIOLOGY DATA:  Lab Results  Component Value Date   WBC 5.1 01/20/2012   HGB 11.5* 01/20/2012   HCT 34.7* 01/20/2012   PLT 298 01/20/2012   GLUCOSE 97 01/20/2012   CHOL 153 03/20/2011   TRIG 74 03/20/2011   HDL 53 03/20/2011   LDLCALC 85 03/20/2011   ALKPHOS 84 01/20/2012   ALT 6 07/13/2011   AST 20 01/20/2012   NA 143 01/20/2012   K 4.3 01/20/2012   CL 100 01/20/2012   CREATININE 1.1 01/20/2012   BUN 18 01/20/2012    CO2 27 01/20/2012   INR 1.01 10/05/2010   IMAGING:  I personally reviewed the following neck CT scan and showed the patient the images.   Ct Soft Tissue Neck W Contrast  01/20/2012  *RADIOLOGY REPORT*  Clinical Data: 56 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 Chemotherapy and radiation complete.  Tracheostomy.  Neck pain.  CT NECK WITH CONTRAST  Technique:  Multidetector CT imaging of the neck was performed with intravenous contrast.  Contrast: OMNIPAQUE IOHEXOL 300 MG/ML  SOLN  Comparison: 07/14/2011 and earlier.  Findings: Right chest Port-A-Cath re-identified.  Stable small right paratracheal lymph nodes since January, these are mildly increased since 2012 and measure up to 7 mm short axis.  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  Sequelae of laryngectomy with tracheal stoma.  Stable postoperative appearance of the thyroid.  Post XRT changes again noted with mild interval regression. Residual diffuse soft tissue stranding, diffuse pharynx mucosal thickening, and salivary gland atrophy again noted.  Negative sublingual space and parapharyngeal spaces.  Stable retropharyngeal space.  No new or increased cervical lymph nodes are identified.  Among the largest stable nodes are those associated with the caudal aspect of  the right parotid gland (up to 6 mm short axis), and those at level I (less than 6 mm short axis).  Major vascular structures remain patent.  Negative visualized brain parenchyma.  Stable orbits, myopia on the right.  Post radiation changes to the lung apices.  Otherwise stable lung parenchyma. No acute osseous abnormality identified.  Continued left maxillary sinus opacification.  Lesser paranasal sinus mucosal thickening elsewhere.  Mastoids and tympanic cavities remain clear.  IMPRESSION: Stable and satisfactory post-therapy appearance of the neck status post laryngectomy and radiation therapy.  Original Report Authenticated By: Harley Hallmark, M.D.     ASSESSMENT AND PLAN:    1. History of larynx cancer: She is status post laryngectomy, status post adjuvant chemoradiation therapy. There is no evidence of disease recurrence or metastatic disease today on clinical history, physical exam, laboratory tests, imaging modality.  Her neck pain is most likely due to cervical disc disease.  I advised her on stretching exercise.  Her pain is controlled by BID prn Lortab and not with OTC pain meds.  If it does not get better or she has arm weakness, I may consider MRI of her cervical spine.  Since she is about 2 years out from the finish of therapy; there is no indication for routine surveillance neck CT.  2. Leukocytopenia and anemia secondary to most likely inflammation from recent chemoradiation therapy. There is no active bleeding. There is no need for transfusion. Her CBC was better late Jan 2013. She was worked up in the past and was negative for myeloma.  3. History of smoking: She no longer smokes now.  4. Hypertension: on lisinopril, metoprolol per PCP. 5. 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 She is on levothyroxine. Her TSH was elevated with depressed free T4. Her TSH is still elevated.  I increased her levothyroxine to PO daily.  She said that she has been adherent to taking levothyroxine.  I will recheck her TSH in about 4 and 8 months.  6. Seizure: none since stopping Effexor.  7. Follow up: With me in clinic in about 12 months. I encouraged her to follow up with Rad Onc and ENT as well.  8. Age appropriate cancer screening:  She said that she is up to date with mammogram and Papsmear.  She is not emotionally ready for a colonoscopy.  She declined GI referral.

## 2012-01-21 NOTE — Patient Instructions (Addendum)
1.  History of larynx cancer:  CT scan was negative.  If persistent pain, may need evaluation by Ear/Nose/Throat. 2.  Hypothyroidism:  I recommend to increase dose of Synthroid from 100 mcg daily to 150 mcg daily.  3.  Follow up:  Lab check in 2 and 4 months for thyroid.  Lab, CT, and return visit in about 6 months.

## 2012-01-22 NOTE — Telephone Encounter (Signed)
Called back to check on change.  It has been changed but will not take place until sept 1st.  Pt informed.    FYI: [facility] [place] [facility], but medicaid worker assures me that her card will say family [facility] on it. Sherrod Toothman, Maryjo [place]

## 2012-02-10 ENCOUNTER — Encounter: Payer: Self-pay | Admitting: Family Medicine

## 2012-02-10 ENCOUNTER — Ambulatory Visit (HOSPITAL_COMMUNITY)
Admission: RE | Admit: 2012-02-10 | Discharge: 2012-02-10 | Disposition: A | Discharge status: Home / Self Care | Payer: Medicaid Other | Source: Ambulatory Visit | Attending: Family Medicine | Admitting: Family Medicine

## 2012-02-10 ENCOUNTER — Ambulatory Visit (INDEPENDENT_AMBULATORY_CARE_PROVIDER_SITE_OTHER): Payer: Medicaid Other | Admitting: Family Medicine

## 2012-02-10 VITALS — BP 143/98 | HR 97 | Temp 98.5°F | Ht 67.0 in | Wt 168.0 lb

## 2012-02-10 DIAGNOSIS — J069 Acute upper respiratory infection, unspecified: Secondary | ICD-10-CM

## 2012-02-10 DIAGNOSIS — M542 Cervicalgia: Secondary | ICD-10-CM

## 2012-02-10 DIAGNOSIS — Z8521 Personal history of malignant neoplasm of larynx: Secondary | ICD-10-CM | POA: Insufficient documentation

## 2012-02-10 MED ORDER — ONDANSETRON HCL 4 MG PO TABS: 4.0000 mg | ORAL_TABLET | Freq: Three times a day (TID) | ORAL | Status: AC | PRN

## 2012-02-10 MED ORDER — CYCLOBENZAPRINE HCL 5 MG PO TABS: 5.0000 mg | ORAL_TABLET | Freq: Three times a day (TID) | ORAL | Status: AC | PRN

## 2012-02-10 MED ORDER — BENZONATATE 100 MG PO CAPS: 100.0000 mg | ORAL_CAPSULE | Freq: Two times a day (BID) | ORAL | Status: AC | PRN

## 2012-02-10 NOTE — Progress Notes (Signed)
Patient ID: Deborah [name], female   DOB: [birth year], 56 y.o.   MRN: [id number] Patient ID: Deborah [name]    DOB: 23-Apr-1956, 56 y.o.   MRN: [id number] --- Subjective:  Deborah [name] is a 56 y.o.female with h/o supraglottic laryngeal cancer in 2012 who presents as same day visit with 4 days of cough and vomiting. - cough: for 4 days, coughing mucus.  Has post tussive emesis, 4 times per day. Associated with nausea. No diarrhea or constipation. Some mild abdominal cramping. Has been trouble keeping food down because of emesis. Appetite is ok. Also reports runny nose and sore throat. Denies fevers or chills.  - neck pain: has had neck pain for a couple months. Not any worst recently but not better. Describes pain as sharp, worst with movement of head from side to side. Better with hydrocodone. Also feels muscle spasms at back of neck. Had one episode of numbness in left arm, lasted a few seconds. Otherwise, no upper extremity weakness, tingling or loss of sensation.    ROS: see HPI Past Medical History: reviewed and updated medications and allergies. Social History: Tobacco: denies current use  Objective: Filed Vitals:   02/10/12 1026  BP: 143/98  Pulse: 97  Temp: 98.5 F (36.9 C)    Physical Examination:   General appearance - alert, well appearing, and in no distress, able to talk with obstruction of the stoma.  Ears - bilateral TM's and external ear canals normal Nose - erythematous and congested nasal turbinates bilaterally.  Mouth - mucous membranes moist, pharynx normal without lesions Neck - stoma present with chronic skin changes. No erythema or warmth. Pain with movement from side to side. Positive spurling's on right side. Tenderness along trapezius bilaterally. No bony spinal tenderness.  Chest - clear to auscultation, no wheezes, rales or rhonchi, symmetric air entry Heart - normal rate, regular rhythm, normal S1, S2, no murmurs, rubs, clicks or gallops Abdomen - soft,  nontender, nondistended, no masses or organomegaly

## 2012-02-10 NOTE — Patient Instructions (Signed)
I think you have a viral upper repiratory infection. Take the cough medicine and the nausea medicine for it. If you start running a fever, if things get worst, then come back to the clinic. It should go away in 7-10 days.  For the neck pain, continue the hydrocodone and start with the flexeril for the muscle spasms. I am going to get an xray of your neck. It's important to come back next week to see Dr. Armen Pickup for further evaluation.

## 2012-02-10 NOTE — Assessment & Plan Note (Signed)
Possible cervical disc disease given positive spurling's. Will check plain cervical xray and precribe flexeril for muscle spasms. Patient may need MRI of the cervical spine. She is to follow up with her PCP next week about this. Reviewed red flags with patient: weakness in arms, numbness, tingling.

## 2012-02-10 NOTE — Assessment & Plan Note (Addendum)
Cough and mucous production consistent with URI. Patient is afebrile and doesn't appear dehydrated. Will treat symptomatically with tessalon pearls. Nausea and vomiting appear post-tussive in nature. Will treat nausea, likely from post nasal drip with zofran.  Patient stated that she can't take mucinex because it dries up her secretions too much and she had to have surgery last time that happened.  Patient to return if worsening symptoms, fever or difficulty breathing.

## 2012-02-19 ENCOUNTER — Ambulatory Visit: Payer: Self-pay | Admitting: Family Medicine

## 2012-02-20 ENCOUNTER — Telehealth: Payer: Self-pay | Admitting: *Deleted

## 2012-02-20 NOTE — Telephone Encounter (Signed)
Patient had appt yesterday at [facility] for otolaryngology.  Due to patient having Medicaid, office calling to request NPI #  to authorize appt.  NPI # given.  Gaylene Brooks, RN

## 2012-03-18 ENCOUNTER — Telehealth: Payer: Self-pay | Admitting: Family Medicine

## 2012-03-18 NOTE — Telephone Encounter (Signed)
[employer] form for Tracheostoma Supplies placed in Dr. Armen Pickup box for signature.  Ileana Ladd

## 2012-03-18 NOTE — Telephone Encounter (Signed)
Rx and Diagnosis form to be completed by [employer].

## 2012-03-19 ENCOUNTER — Ambulatory Visit (INDEPENDENT_AMBULATORY_CARE_PROVIDER_SITE_OTHER): Payer: Medicaid Other | Admitting: *Deleted

## 2012-03-19 DIAGNOSIS — Z23 Encounter for immunization: Secondary | ICD-10-CM

## 2012-03-22 ENCOUNTER — Other Ambulatory Visit (HOSPITAL_BASED_OUTPATIENT_CLINIC_OR_DEPARTMENT_OTHER): Payer: Medicaid Other | Admitting: Lab

## 2012-03-22 ENCOUNTER — Telehealth: Payer: Self-pay | Admitting: *Deleted

## 2012-03-22 DIAGNOSIS — E039 Hypothyroidism, unspecified: Secondary | ICD-10-CM

## 2012-03-22 DIAGNOSIS — C321 Malignant neoplasm of supraglottis: Secondary | ICD-10-CM

## 2012-03-22 DIAGNOSIS — M542 Cervicalgia: Secondary | ICD-10-CM

## 2012-03-22 NOTE — Telephone Encounter (Signed)
Called pt on cell # and instructed her to TSH wnl and to continue same dose of Thyroid medication.  She said "ok."

## 2012-03-22 NOTE — Telephone Encounter (Signed)
Message copied by Wende Mott on Mon Mar 22, 2012  1:56 PM ------      Message from: [name] T      Created: Mon Mar 22, 2012  1:53 PM       Please call patient advised her to take Synthroid at the same dose for now. Thank you

## 2012-03-23 MED ORDER — CYCLOBENZAPRINE HCL 10 MG PO TABS: 10.0000 mg | ORAL_TABLET | Freq: Two times a day (BID) | ORAL | Status: DC | PRN

## 2012-03-23 NOTE — Telephone Encounter (Signed)
Form signed and returned to [employer].

## 2012-03-24 NOTE — Telephone Encounter (Signed)
[employer] form faxed to [phone] per patient's request.  Deborah [name]

## 2012-04-02 ENCOUNTER — Other Ambulatory Visit: Payer: Self-pay | Admitting: Family Medicine

## 2012-04-02 DIAGNOSIS — Z1231 Encounter for screening mammogram for malignant neoplasm of breast: Secondary | ICD-10-CM

## 2012-04-15 ENCOUNTER — Ambulatory Visit (HOSPITAL_COMMUNITY)
Admission: RE | Admit: 2012-04-15 | Discharge: 2012-04-15 | Disposition: A | Discharge status: Home / Self Care | Payer: Medicaid Other | Source: Ambulatory Visit | Attending: Family Medicine | Admitting: Family Medicine

## 2012-04-15 DIAGNOSIS — Z1231 Encounter for screening mammogram for malignant neoplasm of breast: Secondary | ICD-10-CM | POA: Insufficient documentation

## 2012-04-28 ENCOUNTER — Other Ambulatory Visit: Payer: Self-pay | Admitting: *Deleted

## 2012-04-28 DIAGNOSIS — E039 Hypothyroidism, unspecified: Secondary | ICD-10-CM

## 2012-04-28 DIAGNOSIS — M542 Cervicalgia: Secondary | ICD-10-CM

## 2012-04-28 DIAGNOSIS — C321 Malignant neoplasm of supraglottis: Secondary | ICD-10-CM

## 2012-04-28 MED ORDER — LEVOTHYROXINE SODIUM 150 MCG PO TABS: 150.0000 ug | ORAL_TABLET | Freq: Every day | ORAL | Status: DC

## 2012-04-28 NOTE — Addendum Note (Signed)
Addended by: Damita Lack on: 04/28/2012 02:26 PM   Modules accepted: Orders

## 2012-05-21 ENCOUNTER — Other Ambulatory Visit (HOSPITAL_BASED_OUTPATIENT_CLINIC_OR_DEPARTMENT_OTHER): Payer: Medicaid Other | Admitting: Lab

## 2012-05-21 DIAGNOSIS — C321 Malignant neoplasm of supraglottis: Secondary | ICD-10-CM

## 2012-05-21 DIAGNOSIS — E039 Hypothyroidism, unspecified: Secondary | ICD-10-CM

## 2012-05-21 DIAGNOSIS — M542 Cervicalgia: Secondary | ICD-10-CM

## 2012-05-26 ENCOUNTER — Telehealth: Payer: Self-pay | Admitting: *Deleted

## 2012-05-26 ENCOUNTER — Other Ambulatory Visit: Payer: Self-pay | Admitting: Oncology

## 2012-05-26 DIAGNOSIS — E039 Hypothyroidism, unspecified: Secondary | ICD-10-CM

## 2012-05-26 MED ORDER — LEVOTHYROXINE SODIUM 175 MCG PO TABS: 175.0000 ug | ORAL_TABLET | Freq: Every day | ORAL | Status: DC

## 2012-05-26 NOTE — Telephone Encounter (Signed)
Called pt's sister to ask her to check w/ pt what dose of Synthroid she is currently taking and to ask her is she is taking it regularly.  Explained Thyroid level still off and we may need to increase her dose if she is taking it regularly.  If not then pt needs to take every day consistently and either way pt will need TSH rechecked in 2 months.  Sister verbalized understanding and will call back.  (I called sister due to pt having trach and I have difficult time understanding pt herself on the phone).

## 2012-05-26 NOTE — Telephone Encounter (Signed)
Pt self returned call and I could understand her on phone today.  She states she is taking her levothyroxine every day,  150 mcg.  Informed her I will call her back w/ new dose and send to [facility] pharmacy. She verbalized understanding.

## 2012-05-26 NOTE — Telephone Encounter (Signed)
I have prescribed Synthroid 175 mcg po daily. I have sent prescription to pharmacy on file [facility]).

## 2012-05-26 NOTE — Telephone Encounter (Signed)
Called pt and notified of dose change in Synthroid to 175 mcg daily and new Rx sent [facility].  Next lab in 2 months already scheduled for 07/24/11.  Pt verbalized understanding.

## 2012-05-26 NOTE — Telephone Encounter (Signed)
Message copied by Wende Mott on Wed May 26, 2012  8:52 AM ------      Message from: HA, Raliegh Ip T      Created: Mon May 24, 2012 11:58 PM       Please confirm that patient is taking her Synthroid as previously instructed.  If she is, then, she is still hyperthyroid, and will need to increase her Synthroid to one more level.  Either way, please place a POF and order to recheck her TSH in 2 months.  Thanks.

## 2012-05-27 ENCOUNTER — Encounter: Payer: Self-pay | Admitting: Family Medicine

## 2012-05-27 ENCOUNTER — Ambulatory Visit (INDEPENDENT_AMBULATORY_CARE_PROVIDER_SITE_OTHER): Payer: Medicaid Other | Admitting: Family Medicine

## 2012-05-27 VITALS — BP 132/86 | HR 85 | Temp 98.8°F | Ht 67.0 in | Wt 176.9 lb

## 2012-05-27 DIAGNOSIS — R3589 Other polyuria: Secondary | ICD-10-CM

## 2012-05-27 DIAGNOSIS — A088 Other specified intestinal infections: Secondary | ICD-10-CM

## 2012-05-27 DIAGNOSIS — R358 Other polyuria: Secondary | ICD-10-CM

## 2012-05-27 DIAGNOSIS — R112 Nausea with vomiting, unspecified: Secondary | ICD-10-CM

## 2012-05-27 DIAGNOSIS — A084 Viral intestinal infection, unspecified: Secondary | ICD-10-CM

## 2012-05-27 DIAGNOSIS — R197 Diarrhea, unspecified: Secondary | ICD-10-CM

## 2012-05-27 LAB — POCT URINALYSIS DIPSTICK
Bilirubin, UA: NEGATIVE
Blood, UA: NEGATIVE
Ketones, UA: NEGATIVE
Protein, UA: NEGATIVE
Spec Grav, UA: <=1.005
pH, UA: 5.5

## 2012-05-27 LAB — POCT UA - MICROSCOPIC ONLY

## 2012-05-27 MED ORDER — PROMETHAZINE HCL 25 MG PO TABS: 25.0000 mg | ORAL_TABLET | Freq: Three times a day (TID) | ORAL | Status: DC | PRN

## 2012-05-27 MED ORDER — OMEPRAZOLE 40 MG PO CPDR: 40.0000 mg | DELAYED_RELEASE_CAPSULE | Freq: Every day | ORAL | Status: DC

## 2012-05-27 NOTE — Assessment & Plan Note (Signed)
Likely viral gastroenteritis.   No abdominal pain per her report.  Does have some LLQ and epigastric tenderness.   Able to hold down liquids.  Prilosec and Phenergan for relief of symptoms.   No fevers or chills at home, much less likely diverticulitis.  Patient does have history of cancer s/p chemotherapy and radation, so low threshold for further diagnostic testing if no improvement.  Does have some polyuria, possibly secondary to UTI.  Sending urine for culture and will call patient with results.   Will call and check up on her tomorrow as well.   FU with [place] on Monday to ensure she is recovering well.

## 2012-05-27 NOTE — Patient Instructions (Addendum)
I think you have a stomach "bug."    I will send in 2 medicines for you. Take the Prilosec for relief of your indigestion. Take the Phenergan for relief of your nausea.  I will call you with the urine culture results.  If you start having any abdominal pain, fevers, cannot hold down any liquids, or just aren't getting better, go to the ED over the weekend or come back and see [place] on Monday.

## 2012-05-27 NOTE — Addendum Note (Signed)
Addended by: [place], Makahla Kiser on: 05/27/2012 04:39 PM   Modules accepted: Orders

## 2012-05-27 NOTE — Addendum Note (Signed)
Addended by: Tobey Grim on: 05/27/2012 03:39 PM   Modules accepted: Orders

## 2012-05-27 NOTE — Progress Notes (Signed)
Patient ID: Deborah [name], female   DOB: 15-Feb-1956, 56 y.o.   MRN: [id number] Deborah [name] is a 56 y.o. female with PMH of GERD, HTN, and supraglotttic cancer s/p chemoradiation and surgical rescection who presents to [facility] today for nausea, vomiting, and diarrhea of 5 days duration:  1.  N/V/Diarrhea:  Feels this is somewhat improving.  Unable to hold down food for past 2 days, but feels hungry today.  Diarrhea described as loose, watery.  3-4 episodes a day.  Some polyuria which is new for her, also nocturia which is NOT new for her (years). Does complain of acid reflux/indigestion symptoms.  Taking OTC prilosec without relief.  Has tried Pepto-bismol without relief.  No melena, no hematochezia, non-bloody, non-bilious vomiting.  Able to hold down po fluids.  No fever or chills.  No abdominal pain.    States that bowel movements were normal (soft/once a day) prior to diarrhea.     The following portions of the patient's history were reviewed and updated as appropriate: allergies, current medications, past medical history, family and social history, and problem list.  Patient is a nonsmoker.  Past Medical History  Diagnosis Date  . Hypertension   . Tracheostomy dependent   . Gastrostomy feeding   . History of laryngectomy   . Bronchitis   . 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  [name] Heart murmur     asymptomatic   . Leukocytopenia   . Asthma   . Sinusitis, chronic 07/20/2011  . Normal MRI 07/14/11    negative for mestasis   . Hx of radiation therapy 09/03/10 to 10/16/2010    supraglottic larynx  . Seizures     07/24/11 off Effexor w/o seizure  . 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 Neck pain 01/21/2012    ROS as above otherwise neg. No Chest pain, palpitations, SOB, Fever, Chills, Abd pain, N/V/D.  Medications reviewed. Current Outpatient Prescriptions  Medication Sig Dispense Refill  . cyclobenzaprine (FLEXERIL) 10 MG tablet  Take 1 tablet (10 mg total) by mouth 2 (two) times daily as needed for muscle spasms.  30 tablet  2  . HYDROcodone-acetaminophen (NORCO/VICODIN) 5-325 MG per tablet Take 1 tablet by mouth every 8 (eight) hours as needed for pain. For pain  90 tablet  0  . levothyroxine (SYNTHROID) 175 MCG tablet Take 1 tablet (175 mcg total) by mouth daily.  30 tablet  2  . lisinopril (PRINIVIL,ZESTRIL) 10 MG tablet Take 1 tablet (10 mg total) by mouth daily.  90 tablet  1  . metoprolol tartrate (LOPRESSOR) 25 MG tablet Take 1 tablet (25 mg total) by mouth 2 (two) times daily.  180 tablet  1    Exam:  BP 160/94  Pulse 85  Temp 98.8 F (37.1 C) (Oral)  Ht 5\' 7"  (1.702 m)  Wt 176 lb 14.4 oz (80.241 kg)  BMI 27.71 kg/m2 Gen: Well NAD.  Convereses with tracheostomy.  Joking, no distress.   HEENT: EOMI,  MMM Lungs: CTABL Nl WOB Heart: RRR no MRG Abd: Soft/ND.  TTP mildly in LLQ and epigastrum. Otherwise without tenderness in abdomen.  No guarding or rebound.  Bowel sounds good throughout.   Exts: Non edematous BL  LE, warm and well perfused.   No results found for this or any previous visit (from the past 72 hour(s)).

## 2012-05-29 LAB — URINE CULTURE

## 2012-05-31 ENCOUNTER — Telehealth: Payer: Self-pay | Admitting: Family Medicine

## 2012-05-31 NOTE — Telephone Encounter (Signed)
Called to check on patient as I had promised at last visit.  Wanted to ensure nausea and vomiting were improving.  Had to leave message instructing her to call clinic.  If she is still not feeling well, she should probably schedule a follow-up appointment.

## 2012-06-01 NOTE — Telephone Encounter (Signed)
Spoke w/pt's sister, states pt is doing a lot better.

## 2012-06-07 ENCOUNTER — Other Ambulatory Visit: Payer: Self-pay | Admitting: *Deleted

## 2012-06-07 DIAGNOSIS — I10 Essential (primary) hypertension: Secondary | ICD-10-CM

## 2012-06-07 MED ORDER — LISINOPRIL 10 MG PO TABS: 10.0000 mg | ORAL_TABLET | Freq: Every day | ORAL | Status: DC

## 2012-06-18 ENCOUNTER — Other Ambulatory Visit: Payer: Self-pay | Admitting: Otolaryngology

## 2012-06-18 ENCOUNTER — Ambulatory Visit (HOSPITAL_COMMUNITY)
Admission: RE | Admit: 2012-06-18 | Discharge: 2012-06-18 | Disposition: A | Discharge status: Home / Self Care | Payer: Medicaid Other | Source: Ambulatory Visit | Attending: Otolaryngology | Admitting: Otolaryngology

## 2012-06-18 ENCOUNTER — Other Ambulatory Visit (HOSPITAL_COMMUNITY): Payer: Self-pay | Admitting: Otolaryngology

## 2012-06-18 DIAGNOSIS — R4702 Dysphasia: Secondary | ICD-10-CM

## 2012-06-18 DIAGNOSIS — R4789 Other speech disturbances: Secondary | ICD-10-CM | POA: Insufficient documentation

## 2012-06-21 ENCOUNTER — Encounter (HOSPITAL_BASED_OUTPATIENT_CLINIC_OR_DEPARTMENT_OTHER)
Admission: RE | Disposition: A | Discharge status: Home / Self Care | Payer: Self-pay | Source: Ambulatory Visit | Attending: Otolaryngology

## 2012-06-21 ENCOUNTER — Encounter (HOSPITAL_BASED_OUTPATIENT_CLINIC_OR_DEPARTMENT_OTHER): Payer: Self-pay | Admitting: *Deleted

## 2012-06-21 ENCOUNTER — Ambulatory Visit (HOSPITAL_BASED_OUTPATIENT_CLINIC_OR_DEPARTMENT_OTHER): Payer: Medicaid Other | Admitting: Anesthesiology

## 2012-06-21 ENCOUNTER — Telehealth: Payer: Self-pay | Admitting: Family Medicine

## 2012-06-21 ENCOUNTER — Ambulatory Visit (HOSPITAL_BASED_OUTPATIENT_CLINIC_OR_DEPARTMENT_OTHER)
Admission: RE | Admit: 2012-06-21 | Discharge: 2012-06-21 | Disposition: A | Discharge status: Home / Self Care | Payer: Medicaid Other | Source: Ambulatory Visit | Attending: Otolaryngology | Admitting: Otolaryngology

## 2012-06-21 ENCOUNTER — Encounter (HOSPITAL_BASED_OUTPATIENT_CLINIC_OR_DEPARTMENT_OTHER): Payer: Self-pay | Admitting: Anesthesiology

## 2012-06-21 DIAGNOSIS — R1310 Dysphagia, unspecified: Secondary | ICD-10-CM

## 2012-06-21 DIAGNOSIS — J45909 Unspecified asthma, uncomplicated: Secondary | ICD-10-CM | POA: Insufficient documentation

## 2012-06-21 DIAGNOSIS — Z930 Tracheostomy status: Secondary | ICD-10-CM | POA: Insufficient documentation

## 2012-06-21 DIAGNOSIS — I10 Essential (primary) hypertension: Secondary | ICD-10-CM

## 2012-06-21 DIAGNOSIS — Z01812 Encounter for preprocedural laboratory examination: Secondary | ICD-10-CM | POA: Insufficient documentation

## 2012-06-21 DIAGNOSIS — E032 Hypothyroidism due to medicaments and other exogenous substances: Secondary | ICD-10-CM | POA: Insufficient documentation

## 2012-06-21 DIAGNOSIS — Z431 Encounter for attention to gastrostomy: Secondary | ICD-10-CM | POA: Insufficient documentation

## 2012-06-21 DIAGNOSIS — Z8521 Personal history of malignant neoplasm of larynx: Secondary | ICD-10-CM | POA: Insufficient documentation

## 2012-06-21 HISTORY — PX: ESOPHAGOSCOPY: SHX5534

## 2012-06-21 LAB — POCT I-STAT, CHEM 8
BUN: 14 mg/dL (ref 6–23)
Calcium, Ion: 1.18 mmol/L (ref 1.12–1.23)
Chloride: 104 mEq/L (ref 96–112)
HCT: 27 % — ABNORMAL LOW (ref 36.0–46.0)
Potassium: 4.2 mEq/L (ref 3.5–5.1)

## 2012-06-21 SURGERY — ESOPHAGOSCOPY
Anesthesia: General | Site: Throat | Wound class: Clean Contaminated

## 2012-06-21 MED ORDER — HYDROMORPHONE HCL PF 1 MG/ML IJ SOLN: 0.2500 mg | INTRAMUSCULAR | Status: DC | PRN

## 2012-06-21 MED ORDER — LACTATED RINGERS IV SOLN
INTRAVENOUS | Status: DC
  Administered 2012-06-21 (×2): via INTRAVENOUS

## 2012-06-21 MED ORDER — PROPOFOL 10 MG/ML IV BOLUS
INTRAVENOUS | Status: DC | PRN
  Administered 2012-06-21: 150 mg via INTRAVENOUS
  Administered 2012-06-21: 50 mg via INTRAVENOUS

## 2012-06-21 MED ORDER — FENTANYL CITRATE 0.05 MG/ML IJ SOLN
INTRAMUSCULAR | Status: DC | PRN
  Administered 2012-06-21: 100 ug via INTRAVENOUS

## 2012-06-21 MED ORDER — METOPROLOL TARTRATE 1 MG/ML IV SOLN
INTRAVENOUS | Status: DC | PRN
  Administered 2012-06-21: 1 mg via INTRAVENOUS

## 2012-06-21 MED ORDER — LISINOPRIL 10 MG PO TABS: 10.0000 mg | ORAL_TABLET | Freq: Every day | ORAL | Status: DC

## 2012-06-21 MED ORDER — ONDANSETRON HCL 4 MG/2ML IJ SOLN
INTRAMUSCULAR | Status: DC | PRN
  Administered 2012-06-21: 4 mg via INTRAVENOUS

## 2012-06-21 MED ORDER — MIDAZOLAM HCL 5 MG/5ML IJ SOLN
INTRAMUSCULAR | Status: DC | PRN
  Administered 2012-06-21: 2 mg via INTRAVENOUS

## 2012-06-21 MED ORDER — LIDOCAINE HCL (CARDIAC) 20 MG/ML IV SOLN
INTRAVENOUS | Status: DC | PRN
  Administered 2012-06-21: 60 mg via INTRAVENOUS

## 2012-06-21 MED ORDER — OXYCODONE HCL 5 MG PO TABS: 5.0000 mg | ORAL_TABLET | Freq: Once | ORAL | Status: DC | PRN

## 2012-06-21 MED ORDER — OXYCODONE HCL 5 MG/5ML PO SOLN
5.0000 mg | Freq: Once | ORAL | Status: DC | PRN
Start: 2012-06-21 — End: 2012-06-21

## 2012-06-21 MED ORDER — METOPROLOL TARTRATE 25 MG PO TABS: 25.0000 mg | ORAL_TABLET | Freq: Two times a day (BID) | ORAL | Status: DC

## 2012-06-21 SURGICAL SUPPLY — 21 items
CANISTER SUCTION 1200CC (MISCELLANEOUS) ×2 IMPLANT
CLOTH BEACON ORANGE TIMEOUT ST (SAFETY) ×2 IMPLANT
CONT SPEC 4OZ CLIKSEAL STRL BL (MISCELLANEOUS) IMPLANT
GAUZE SPONGE 4X4 12PLY STRL LF (GAUZE/BANDAGES/DRESSINGS) ×2 IMPLANT
GLOVE BIO SURGEON STRL SZ 6.5 (GLOVE) ×2 IMPLANT
GLOVE ECLIPSE 7.5 STRL STRAW (GLOVE) ×2 IMPLANT
GLOVE INDICATOR 7.0 STRL GRN (GLOVE) ×2 IMPLANT
GOWN PREVENTION PLUS XLARGE (GOWN DISPOSABLE) ×2 IMPLANT
GOWN PREVENTION PLUS XXLARGE (GOWN DISPOSABLE) IMPLANT
GUARD TEETH (MISCELLANEOUS) IMPLANT
NEEDLE HYPO 18GX1.5 BLUNT FILL (NEEDLE) IMPLANT
NEEDLE SPNL 22GX7 QUINCKE BK (NEEDLE) IMPLANT
NS IRRIG 1000ML POUR BTL (IV SOLUTION) IMPLANT
PATTIES SURGICAL .5 X3 (DISPOSABLE) ×2 IMPLANT
SHEET MEDIUM DRAPE 40X70 STRL (DRAPES) ×2 IMPLANT
SURGILUBE 2OZ TUBE FLIPTOP (MISCELLANEOUS) ×2 IMPLANT
SYR 5ML LL (SYRINGE) IMPLANT
SYR CONTROL 10ML LL (SYRINGE) IMPLANT
TOWEL OR 17X24 6PK STRL BLUE (TOWEL DISPOSABLE) ×2 IMPLANT
TUBE CONNECTING 20X1/4 (TUBING) ×2 IMPLANT
WATER STERILE IRR 1000ML POUR (IV SOLUTION) ×2 IMPLANT

## 2012-06-21 NOTE — Anesthesia Preprocedure Evaluation (Signed)
Anesthesia Evaluation  Patient identified by MRN, date of birth, ID band Patient awake    Reviewed: Allergy & Precautions, H&P , NPO status , Patient's Chart, lab work & pertinent test results, reviewed documented beta blocker date and time   Airway Mallampati: II TM Distance: >3 FB Neck ROM: Full    Dental No notable dental hx. (+) Dental Advisory Given   Pulmonary shortness of breath, asthma ,  S/p laryngectomy. Stoma present. Will intubate through the stoma. breath sounds clear to auscultation  Pulmonary exam normal       Cardiovascular hypertension, On Home Beta Blockers and On Medications Rhythm:Regular Rate:Normal     Neuro/Psych Seizures -, Well Controlled,  negative psych ROS   GI/Hepatic negative GI ROS, Neg liver ROS,   Endo/Other  Hypothyroidism   Renal/GU negative Renal ROS  negative genitourinary   Musculoskeletal   Abdominal   Peds  Hematology negative hematology ROS (+)   Anesthesia Other Findings   Reproductive/Obstetrics negative OB ROS                           Anesthesia Physical Anesthesia Plan  ASA: III  Anesthesia Plan: General   Post-op Pain Management:    Induction: Intravenous  Airway Management Planned: Oral ETT  Additional Equipment:   Intra-op Plan:   Post-operative Plan: Extubation in OR  Informed Consent: I have reviewed the patients History and Physical, chart, labs and discussed the procedure including the risks, benefits and alternatives for the proposed anesthesia with the patient or authorized representative who has indicated his/her understanding and acceptance.   Dental advisory given  Plan Discussed with: CRNA  Anesthesia Plan Comments:         Anesthesia Quick Evaluation

## 2012-06-21 NOTE — Anesthesia Procedure Notes (Addendum)
Procedure Name: Intubation Date/Time: 06/21/2012 11:28 AM Performed by: Burna Cash Pre-anesthesia Checklist: Patient identified, Emergency Drugs available, Suction available and Patient being monitored Patient Re-evaluated:Patient Re-evaluated prior to inductionOxygen Delivery Method: Circle System Utilized Preoxygenation: Pre-oxygenation with 100% oxygen Intubation Type: IV induction Tube type: Oral Tube size: 5.5 mm Number of attempts: 1 Placement Confirmation: positive ETCO2 and breath sounds checked- equal and bilateral Tube secured with: Tape Dental Injury: Teeth and Oropharynx as per pre-operative assessment  Comments: ETT inserted into tracheal stoma after induction

## 2012-06-21 NOTE — Telephone Encounter (Signed)
Please advise patient to call request in to her pharmacy in the future.

## 2012-06-21 NOTE — Op Note (Signed)
OPERATIVE REPORT  DATE OF SURGERY: 06/21/2012  PATIENT:  Deborah [name],  57 y.o. female  PRE-OPERATIVE DIAGNOSIS:  dysphagia  POST-OPERATIVE DIAGNOSIS:  dysphagia  PROCEDURE:  Procedure(s): ESOPHAGOSCOPY  SURGEON:  [name], MD  ASSISTANTS: none  ANESTHESIA:   General   EBL:  0 ml  DRAINS: none  LOCAL MEDICATIONS USED:  None  SPECIMEN:  none  COUNTS:  Correct  PROCEDURE DETAILS: The patient was taken to the operating room and placed on the operating table in the supine position. Following induction of general endotracheal anesthesia, through the laryngeal stoma, the table was turned 90 and the patient was draped in a standard fashion. A Jako laryngoscope was used to view the hypopharynx. No lesions were noted. A rigid cervical esophagoscope was then inserted into the oral cavity and passed through the pharynx and the upper esophagus. The TEP was stabilized in place with a hemostat externally while the scope was passed beyond the TEP. The scope was passed as far as it would go up to the hub, and it was slowly withdrawn while suctioning secretions and carefully inspecting the esophageal wall. There were no mucosal lesions identified. There is no evidence of stricture or obstruction. The scope was removed and the patient was then awakened extubated and transferred to recovery in stable condition    PATIENT DISPOSITION:  To PACU, stable

## 2012-06-21 NOTE — H&P (Signed)
Deborah [name] is an 57 y.o. female.   Chief Complaint: Difficulty swallowing HPI: Status post total laryngectomy a couple years ago. She's been having increasing difficulty swallowing the past couple of weeks. Barium swallow on Friday revealed a tissue growth in the esophagus.  Past Medical History  Diagnosis Date  . Hypertension   . Tracheostomy dependent   . Gastrostomy feeding   . History of laryngectomy   . Bronchitis   . 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  [name] Heart murmur     asymptomatic   . Leukocytopenia   . Asthma   . Sinusitis, chronic 07/20/2011  . Normal MRI 07/14/11    negative for mestasis   . Hx of radiation therapy 09/03/10 to 10/16/2010    supraglottic larynx  . Seizures     07/24/11 off Effexor w/o seizure  . 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 Neck pain 01/21/2012    Past Surgical History  Procedure Date  . Portacath placement 09/17/10    Tip in cavoatrial junction  . Laryngectomy   . Tracheal dilitation 07/16/2011    Procedure: TRACHEAL DILITATION;  Surgeon: [name], MD;  Location: [facility] OR;  Service: ENT;  Laterality: N/A;  dilation of tracheal stoma and replacement of stoma tube  . Foreign body removal bronchial 10/02/2011    Procedure: REMOVAL FOREIGN BODY BRONCHIAL;  Surgeon: Melvenia Beam, MD;  Location: [facility] OR;  Service: ENT;  Laterality: N/A;    Family History  Problem Relation Age of Onset  . Heart disease Mother   . Heart disease Father   . Heart disease Sister    Social History:  reports that she quit smoking about 21 months ago. Her smoking use included Cigarettes. She does not have any smokeless tobacco history on file. She reports that she does not drink alcohol or use illicit drugs.  Allergies: No Known Allergies  Medications Prior to Admission  Medication Sig Dispense Refill  . cyclobenzaprine (FLEXERIL) 10 MG tablet Take 1 tablet (10 mg total) by mouth 2 (two) times daily as  needed for muscle spasms.  30 tablet  2  . levothyroxine (SYNTHROID) 175 MCG tablet Take 1 tablet (175 mcg total) by mouth daily.  30 tablet  2  . lisinopril (PRINIVIL,ZESTRIL) 10 MG tablet Take 1 tablet (10 mg total) by mouth daily.  90 tablet  1  . metoprolol tartrate (LOPRESSOR) 25 MG tablet Take 1 tablet (25 mg total) by mouth 2 (two) times daily.  180 tablet  1  . omeprazole (PRILOSEC) 40 MG capsule Take 1 capsule (40 mg total) by mouth daily.  30 capsule  3  . promethazine (PHENERGAN) 25 MG tablet Take 1 tablet (25 mg total) by mouth every 8 (eight) hours as needed for nausea.  20 tablet  0    No results found for this or any previous visit (from the past 48 hour(s)). No results found.  ROS: otherwise negative  Blood pressure 129/84, pulse 96, temperature 97.7 F (36.5 C), temperature source Oral, resp. rate 16, height 5\' 7"  (1.702 m), weight 172 lb 8 oz (78.245 kg), SpO2 96.00%.  PHYSICAL EXAM: Overall appearance:  Healthy appearing, in no distress Head:  Normocephalic, atraumatic. Ears: External auditory canals are clear; tympanic membranes are intact and the middle ears are free of any effusion. Nose: External nose is healthy in appearance. Internal nasal exam free of any lesions or obstruction. Oral Cavity/pharynx:  There  are no mucosal lesions or masses identified. Hypopharynx/Larynx: Tracheal stoma looks healthy, TEP in place. She speaks using the TEP only. Neuro:  No identifiable neurologic deficits. Neck: No palpable neck masses.  Studies Reviewed: Barium swallow    Assessment/Plan Recommend esophagoscopy with biopsy and/or dilation.  [name] 06/21/2012, 9:55 AM

## 2012-06-21 NOTE — Telephone Encounter (Signed)
Patient needs a refill of her blood pressure medicine called in to [facility] on [address].

## 2012-06-21 NOTE — Telephone Encounter (Signed)
Refills sent in

## 2012-06-21 NOTE — Telephone Encounter (Signed)
LMOVM informing her that "refill she requested has been sent and you should call your pharmacy and have them contact us". Fran Neiswonger, Maryjo [place]

## 2012-06-21 NOTE — Transfer of Care (Signed)
Immediate Anesthesia Transfer of Care Note  Patient: Deborah [name]  Procedure(s) Performed: Procedure(s) (LRB) with comments: ESOPHAGOSCOPY (N/A)  Patient Location: PACU  Anesthesia Type:General  Level of Consciousness: sedated  Airway & Oxygen Therapy: Patient Spontanous Breathing and Patient connected to tracheostomy mask oxygen  Post-op Assessment: Report given to PACU RN and Post -op Vital signs reviewed and stable  Post vital signs: Reviewed and stable  Complications: No apparent anesthesia complications

## 2012-06-21 NOTE — Anesthesia Postprocedure Evaluation (Signed)
Anesthesia Post Note  Patient: Deborah [name]  Procedure(s) Performed: Procedure(s) (LRB): ESOPHAGOSCOPY (N/A)  Anesthesia type: General  Patient location: PACU  Post pain: Pain level controlled and Adequate analgesia  Post assessment: Post-op Vital signs reviewed, Patient's Cardiovascular Status Stable, Respiratory Function Stable, Patent Airway and Pain level controlled  Last Vitals:  Filed Vitals:   06/21/12 1215  BP: 120/80  Pulse: 88  Temp:   Resp: 23    Post vital signs: Reviewed and stable  Level of consciousness: awake, alert  and oriented  Complications: No apparent anesthesia complications

## 2012-06-22 ENCOUNTER — Encounter (HOSPITAL_BASED_OUTPATIENT_CLINIC_OR_DEPARTMENT_OTHER): Payer: Self-pay | Admitting: Otolaryngology

## 2012-07-19 ENCOUNTER — Telehealth: Payer: Self-pay | Admitting: Oncology

## 2012-07-23 ENCOUNTER — Other Ambulatory Visit: Payer: Self-pay | Admitting: Lab

## 2012-07-23 ENCOUNTER — Ambulatory Visit (HOSPITAL_COMMUNITY): Payer: Medicaid Other

## 2012-07-26 ENCOUNTER — Encounter: Payer: Self-pay | Admitting: Oncology

## 2012-07-26 NOTE — Progress Notes (Signed)
This encounter was created in error - please disregard.

## 2012-07-28 ENCOUNTER — Telehealth: Payer: Self-pay | Admitting: Oncology

## 2012-07-28 NOTE — Telephone Encounter (Signed)
S/w the pt and she is aware of her march 2014 appts for the lab and md visit.

## 2012-08-02 ENCOUNTER — Emergency Department (HOSPITAL_COMMUNITY): Payer: Medicaid Other

## 2012-08-02 ENCOUNTER — Inpatient Hospital Stay (HOSPITAL_COMMUNITY)
Admission: EM | Admit: 2012-08-02 | Discharge: 2012-08-04 | DRG: 871 | Disposition: A | Discharge status: Home / Self Care | Payer: Medicaid Other | Attending: Family Medicine | Admitting: Family Medicine

## 2012-08-02 ENCOUNTER — Encounter (HOSPITAL_COMMUNITY): Payer: Self-pay | Admitting: *Deleted

## 2012-08-02 DIAGNOSIS — Z8521 Personal history of malignant neoplasm of larynx: Secondary | ICD-10-CM

## 2012-08-02 DIAGNOSIS — R0609 Other forms of dyspnea: Secondary | ICD-10-CM

## 2012-08-02 DIAGNOSIS — Z930 Tracheostomy status: Secondary | ICD-10-CM

## 2012-08-02 DIAGNOSIS — E039 Hypothyroidism, unspecified: Secondary | ICD-10-CM | POA: Diagnosis present

## 2012-08-02 DIAGNOSIS — I10 Essential (primary) hypertension: Secondary | ICD-10-CM | POA: Diagnosis present

## 2012-08-02 DIAGNOSIS — R011 Cardiac murmur, unspecified: Secondary | ICD-10-CM | POA: Diagnosis present

## 2012-08-02 DIAGNOSIS — D649 Anemia, unspecified: Secondary | ICD-10-CM | POA: Diagnosis present

## 2012-08-02 DIAGNOSIS — Z9221 Personal history of antineoplastic chemotherapy: Secondary | ICD-10-CM

## 2012-08-02 DIAGNOSIS — E1159 Type 2 diabetes mellitus with other circulatory complications: Secondary | ICD-10-CM | POA: Diagnosis present

## 2012-08-02 DIAGNOSIS — Z9089 Acquired absence of other organs: Secondary | ICD-10-CM

## 2012-08-02 DIAGNOSIS — Z931 Gastrostomy status: Secondary | ICD-10-CM

## 2012-08-02 DIAGNOSIS — A419 Sepsis, unspecified organism: Principal | ICD-10-CM | POA: Diagnosis present

## 2012-08-02 DIAGNOSIS — J189 Pneumonia, unspecified organism: Secondary | ICD-10-CM | POA: Diagnosis present

## 2012-08-02 DIAGNOSIS — R0902 Hypoxemia: Secondary | ICD-10-CM | POA: Diagnosis present

## 2012-08-02 DIAGNOSIS — J45909 Unspecified asthma, uncomplicated: Secondary | ICD-10-CM | POA: Diagnosis present

## 2012-08-02 DIAGNOSIS — Z90710 Acquired absence of both cervix and uterus: Secondary | ICD-10-CM

## 2012-08-02 DIAGNOSIS — Z87891 Personal history of nicotine dependence: Secondary | ICD-10-CM

## 2012-08-02 DIAGNOSIS — R599 Enlarged lymph nodes, unspecified: Secondary | ICD-10-CM | POA: Diagnosis present

## 2012-08-02 DIAGNOSIS — R0603 Acute respiratory distress: Secondary | ICD-10-CM

## 2012-08-02 DIAGNOSIS — E890 Postprocedural hypothyroidism: Secondary | ICD-10-CM | POA: Diagnosis present

## 2012-08-02 DIAGNOSIS — Z79899 Other long term (current) drug therapy: Secondary | ICD-10-CM

## 2012-08-02 LAB — CBC WITH DIFFERENTIAL/PLATELET
Basophils Absolute: 0 10*3/uL (ref 0.0–0.1)
Eosinophils Absolute: 0.6 10*3/uL (ref 0.0–0.7)
Eosinophils Relative: 9 % — ABNORMAL HIGH (ref 0–5)
Lymphs Abs: 0.9 10*3/uL (ref 0.7–4.0)
MCH: 28.4 pg (ref 26.0–34.0)
Neutrophils Relative %: 68 % (ref 43–77)
Platelets: ADEQUATE 10*3/uL (ref 150–400)
RDW: 13.1 % (ref 11.5–15.5)

## 2012-08-02 LAB — COMPREHENSIVE METABOLIC PANEL
ALT: 27 U/L (ref 0–35)
AST: 35 U/L (ref 0–37)
Albumin: 3.3 g/dL — ABNORMAL LOW (ref 3.5–5.2)
Alkaline Phosphatase: 109 U/L (ref 39–117)
CO2: 28 mEq/L (ref 19–32)
Chloride: 101 mEq/L (ref 96–112)
Creatinine, Ser: 0.81 mg/dL (ref 0.50–1.10)
Potassium: 3.4 mEq/L — ABNORMAL LOW (ref 3.5–5.1)
Sodium: 139 mEq/L (ref 135–145)
Total Bilirubin: 0.2 mg/dL — ABNORMAL LOW (ref 0.3–1.2)

## 2012-08-02 LAB — APTT: aPTT: 24 seconds (ref 24–37)

## 2012-08-02 LAB — BLOOD GAS, ARTERIAL
Acid-Base Excess: 3.1 mmol/L — ABNORMAL HIGH (ref 0.0–2.0)
Bicarbonate: 27 mEq/L — ABNORMAL HIGH (ref 20.0–24.0)
O2 Saturation: 97.4 %
Patient temperature: 98.1
TCO2: 24.5 mmol/L (ref 0–100)

## 2012-08-02 LAB — PROTIME-INR: INR: 1 (ref 0.00–1.49)

## 2012-08-02 MED ORDER — LEVOFLOXACIN IN D5W 750 MG/150ML IV SOLN
750.0000 mg | Freq: Once | INTRAVENOUS | Status: AC
  Administered 2012-08-02: 750 mg via INTRAVENOUS
  Filled 2012-08-02: qty 150

## 2012-08-02 MED ORDER — MORPHINE SULFATE 4 MG/ML IJ SOLN
4.0000 mg | Freq: Once | INTRAMUSCULAR | Status: AC
  Administered 2012-08-02: 4 mg via INTRAVENOUS
  Filled 2012-08-02: qty 1

## 2012-08-02 MED ORDER — IOHEXOL 350 MG/ML SOLN
100.0000 mL | Freq: Once | INTRAVENOUS | Status: AC | PRN
  Administered 2012-08-02: 55 mL via INTRAVENOUS

## 2012-08-02 MED ORDER — VANCOMYCIN HCL IN DEXTROSE 1-5 GM/200ML-% IV SOLN
1000.0000 mg | Freq: Once | INTRAVENOUS | Status: AC
  Administered 2012-08-03: 1000 mg via INTRAVENOUS
  Filled 2012-08-02: qty 200

## 2012-08-02 NOTE — ED Notes (Signed)
Patient port connected to NS at [facility] on pump

## 2012-08-02 NOTE — ED Notes (Addendum)
Patient placed on portable O2 for transport to CT, CT notified patient is ready for CT scan

## 2012-08-02 NOTE — ED Notes (Signed)
Patient placed back on humidified 40% trach collar.

## 2012-08-02 NOTE — ED Notes (Signed)
Patient transported to CT 

## 2012-08-02 NOTE — ED Notes (Signed)
Portable Chest completed. Unable to complete previously due to sterile procedure in place.

## 2012-08-02 NOTE — ED Notes (Signed)
Patient c/o cold with cough x 1 week, denies fever and chills. Patient has a trach, states she does not use home O2, states she uses a humidifier. Patient states she comes in tonight because she has been unable to cough up the phlegm and feels short of breath. Patient has a [employer]. Patient states last chemo May 2012.

## 2012-08-02 NOTE — ED Provider Notes (Signed)
History     CSN: [id number]  Arrival date & time 08/02/12  4098   First MD Initiated Contact with Patient 08/02/12 1942      Chief Complaint  Patient presents with  . 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 She has not had chemotherapy since May 2012. She presents today because of severe respiratory distress. Her symptoms are persistent, severe, nothing makes it better, she does use humidified air but no oxygen at home. She has had upper respiratory symptoms for the last week, over the last 24 hours her cough has become significantly worse and she is unable to get up any phlegm. She denies fevers or chills, she denies swelling of her lower extremities. The symptoms are severe. It is difficult to talk to the patient because she speaks in very short sentences because of her severe shortness of breath. Thus a level V caveat apply secondary to respiratory distress.  The history is provided by the patient and medical records.    Past Medical History  Diagnosis Date  . Hypertension   . Tracheostomy dependent   . Gastrostomy feeding   . History of laryngectomy   . Bronchitis   . 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  [name] Heart murmur     asymptomatic   . Leukocytopenia   . Asthma   . Sinusitis, chronic 07/20/2011  . Normal MRI 07/14/11    negative for mestasis   . Hx of radiation therapy 09/03/10 to 10/16/2010    supraglottic larynx  . Seizures     07/24/11 off Effexor w/o seizure  . 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 Neck pain 01/21/2012    Past Surgical History  Procedure Laterality Date  . Portacath placement  09/17/10    Tip in cavoatrial junction  . Laryngectomy    .  Tracheal dilitation  07/16/2011    Procedure: TRACHEAL DILITATION;  Surgeon: [name], MD;  Location: [facility] OR;  Service: ENT;  Laterality: N/A;  dilation of tracheal stoma and replacement of stoma tube  . Foreign body removal bronchial  10/02/2011    Procedure: REMOVAL FOREIGN BODY BRONCHIAL;  Surgeon: Melvenia Beam, MD;  Location: [facility] OR;  Service: ENT;  Laterality: N/A;  . Esophagoscopy  06/21/2012    Procedure: ESOPHAGOSCOPY;  Surgeon: Serena Colonel, MD;  Location: [facility];  Service: ENT;  Laterality: N/A;    Family History  Problem Relation Age of Onset  . Heart disease Mother   . Heart disease Father   . Heart disease Sister     History  Substance Use Topics  . Smoking status: Former Smoker    Types: Cigarettes    Quit date: 09/17/2010  . Smokeless tobacco: Not on file  . Alcohol Use: No     Comment: Former drinker 6 months ago    OB History   Grav Para Term Preterm Abortions TAB SAB Ect Mult Living                  Review of Systems  Unable to perform ROS: Severe respiratory distress    Allergies  Review of patient's allergies indicates no known allergies.  Home Medications   Current Outpatient Rx  Name  Route  Sig  Dispense  Refill  .  cyclobenzaprine (FLEXERIL) 10 MG tablet   Oral   Take 1 tablet (10 mg total) by mouth 2 (two) times daily as needed for muscle spasms.   30 tablet   2   . levothyroxine (SYNTHROID) 175 MCG tablet   Oral   Take 1 tablet (175 mcg total) by mouth daily.   30 tablet   2   . lisinopril (PRINIVIL,ZESTRIL) 10 MG tablet   Oral   Take 1 tablet (10 mg total) by mouth daily.   90 tablet   1   . metoprolol tartrate (LOPRESSOR) 25 MG tablet   Oral   Take 1 tablet (25 mg total) by mouth 2 (two) times daily.   180 tablet   1   . omeprazole (PRILOSEC) 40 MG capsule   Oral   Take 1 capsule (40 mg total) by mouth daily.   30 capsule   3   . promethazine (PHENERGAN) 25 MG tablet   Oral   Take 1 tablet (25 mg  total) by mouth every 8 (eight) hours as needed for nausea.   20 tablet   0     BP 168/87  Pulse 124  Temp(Src) 98.1 F (36.7 C) (Oral)  Resp 25  SpO2 98%  Physical Exam  Nursing note and vitals reviewed. Constitutional: She appears well-developed and well-nourished. She appears distressed.  HENT:  Head: Normocephalic and atraumatic.  Mouth/Throat: Oropharynx is clear and moist. No oropharyngeal exudate.  Eyes: Conjunctivae and EOM are normal. Pupils are equal, round, and reactive to light. Right eye exhibits no discharge. Left eye exhibits no discharge. No scleral icterus.  Neck: Normal range of motion. Neck supple. No JVD present. No thyromegaly present.  Tracheostomy is patent, moving air well  Cardiovascular: Normal rate, regular rhythm, normal heart sounds and intact distal pulses.  Exam reveals no gallop and no friction rub.   No murmur heard. Pulmonary/Chest: She is in respiratory distress. She has no rales.  Decreased breath sounds on the right  Abdominal: Soft. Bowel sounds are normal. She exhibits no distension and no mass. There is no tenderness.  Musculoskeletal: Normal range of motion. She exhibits no edema and no tenderness.  Lymphadenopathy:    She has no cervical adenopathy.  Neurological: She is alert. Coordination normal.  Skin: Skin is warm and dry. No rash noted. No erythema.  Psychiatric: She has a normal mood and affect. Her behavior is normal.    ED Course  Procedures (including critical care time)  Labs Reviewed  BLOOD GAS, ARTERIAL - Abnormal; Notable for the following:    Bicarbonate 27.0 (*)    Acid-Base Excess 3.1 (*)    All other components within normal limits  COMPREHENSIVE METABOLIC PANEL - Abnormal; Notable for the following:    Potassium 3.4 (*)    Glucose, Bld 107 (*)    Albumin 3.3 (*)    Total Bilirubin 0.2 (*)    GFR calc non Af Amer 81 (*)    All other components within normal limits  CBC WITH DIFFERENTIAL - Abnormal; Notable  for the following:    RBC 3.84 (*)    Hemoglobin 10.9 (*)    HCT 33.4 (*)    Eosinophils Relative 9 (*)    All other components within normal limits  CULTURE, BLOOD (ROUTINE X 2)  LACTIC ACID, PLASMA  PROTIME-INR  APTT   Ct Angio Chest Pe W/cm &/or Wo Cm  08/02/2012  *RADIOLOGY REPORT*  Clinical Data: Shortness of breath, cough.  CT ANGIOGRAPHY CHEST  Technique:  Multidetector CT imaging of the chest using the standard protocol during bolus administration of intravenous contrast. Multiplanar reconstructed images including MIPs were obtained and reviewed to evaluate the vascular anatomy.  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  Normal caliber aorta.  No dissection.  The main, lobar, and proximal segmental pulmonary arteries are patent.  The peripheral segmental and subsegmental branches are poorly opacified.  Right chest wall Port-A-Cath, with tip projecting at the cavoatrial junction.  Heart size within normal limits.  No pleural or pericardial effusion.  There are mildly prominent mediastinal and hilar lymph nodes, measuring up to 1.3 cm short axis subcarinal. Increased anterior mediastinal soft tissue.  Unchanged axillary lymph nodes.  Limited images through the upper abdomen show no acute finding.  Tracheostomy tube.  Small amount mucus/debris at the level of the carina.  Mild bilateral areas of ground-glass opacity and more confluent nodular opacity within the left lung base.  No pneumothorax.  No acute osseous finding.  IMPRESSION: No pulmonary arterial filling defect identified within the main, lobar, or proximal segmental branches.  The peripheral segmental and subsegmental branches are nondiagnostic.  Bilateral patchy areas of ground-glass opacity may reflect atelectasis or infection.  Clustered nodular left lung base opacity may reflect infection or  aspiration.  There are mildly prominent mediastinal and hilar lymph nodes, measuring up to 1.3 cm short axis subcarinal. may be reactive however metastatic disease not excluded.  Recommend short-term follow-up.  Increased anterior mediastinal soft tissue. May reflect rebound thymic tissue.  Attention at follow-up.   Original Report Authenticated By: Jearld Lesch, M.D.    Dg Chest Port 1 View  08/02/2012  *RADIOLOGY REPORT*  Clinical Data: Shortness of breath.  PORTABLE CHEST - 1 VIEW  Comparison: 10/02/2011  Findings: Right IJ PowerPort extends to the distal SVC.  Airspace disease seen previously has largely resolved.  There is some mild bibasilar interstitial prominence.  Heart size is upper limits normal for technique.  No effusion.  Regional bones unremarkable.  IMPRESSION:  1.  Near complete resolution of the asymmetric airspace disease seen previously.   Original Report Authenticated By: D. Andria Rhein, MD      1. Respiratory distress   2. HCAP (healthcare-associated pneumonia)       MDM  The patient is in severe respiratory distress with a significant tachycardia as high as 140. She is also severely hypertensive and hypoxic requiring oxygenation. Cardiac monitoring, chest x-ray portable, labs including EKG. Would consider pneumothorax as well as infectious etiologies as the likely sources of the patient's symptoms.  CT does not show acute PE, possible pna - ABG without acidosis.  Tachycardia is persistent, no leukocytosis, mild chronic anemia and no renal dysfunction.  Supplemental O2 with improved levels.    Pna treatment started, d/w FP resident - agreeable to admission.      Vida Roller, MD 08/02/12 [phone]

## 2012-08-02 NOTE — ED Notes (Signed)
Respiratory paged to bedside 

## 2012-08-02 NOTE — ED Notes (Signed)
[phone] Brendolyn Patty (patient boyfriend) is leaving, will return.

## 2012-08-02 NOTE — ED Notes (Signed)
Patient on 40% trach collar.

## 2012-08-02 NOTE — ED Notes (Signed)
Pt in with family c/o respiratory distress all day, pt in acute distress at this time, pt has trach, unable to speak in more than short phrases, tripod positioning, O2 stat 88% in triage, initial HR 145.

## 2012-08-03 LAB — BASIC METABOLIC PANEL
BUN: 9 mg/dL (ref 6–23)
CO2: 28 mEq/L (ref 19–32)
Calcium: 9.3 mg/dL (ref 8.4–10.5)
GFR calc non Af Amer: 81 mL/min — ABNORMAL LOW (ref >90)
Glucose, Bld: 94 mg/dL (ref 70–99)
Potassium: 3.4 mEq/L — ABNORMAL LOW (ref 3.5–5.1)

## 2012-08-03 LAB — CBC
HCT: 32.6 % — ABNORMAL LOW (ref 36.0–46.0)
Hemoglobin: 10.6 g/dL — ABNORMAL LOW (ref 12.0–15.0)
MCH: 28.3 pg (ref 26.0–34.0)
MCHC: 32.5 g/dL (ref 30.0–36.0)
RBC: 3.75 MIL/uL — ABNORMAL LOW (ref 3.87–5.11)

## 2012-08-03 LAB — MRSA PCR SCREENING: MRSA by PCR: NEGATIVE

## 2012-08-03 MED ORDER — ONDANSETRON HCL 4 MG PO TABS: 4.0000 mg | ORAL_TABLET | Freq: Four times a day (QID) | ORAL | Status: DC | PRN

## 2012-08-03 MED ORDER — SODIUM CHLORIDE 0.9 % IV SOLN
INTRAVENOUS | Status: DC
  Administered 2012-08-03 – 2012-08-04 (×3): via INTRAVENOUS

## 2012-08-03 MED ORDER — PIPERACILLIN-TAZOBACTAM 3.375 G IVPB 30 MIN
3.3750 g | Freq: Once | INTRAVENOUS | Status: AC
  Administered 2012-08-03: 3.375 g via INTRAVENOUS
  Filled 2012-08-03: qty 50

## 2012-08-03 MED ORDER — POLYETHYLENE GLYCOL 3350 17 G PO PACK: 17.0000 g | PACK | Freq: Every day | ORAL | Status: DC | PRN

## 2012-08-03 MED ORDER — ONDANSETRON HCL 4 MG/2ML IJ SOLN: 4.0000 mg | Freq: Three times a day (TID) | INTRAMUSCULAR | Status: DC | PRN

## 2012-08-03 MED ORDER — METOPROLOL TARTRATE 25 MG PO TABS
25.0000 mg | ORAL_TABLET | Freq: Two times a day (BID) | ORAL | Status: DC
  Administered 2012-08-03 – 2012-08-04 (×3): 25 mg via ORAL
  Filled 2012-08-03 (×5): qty 1

## 2012-08-03 MED ORDER — HEPARIN SODIUM (PORCINE) 5000 UNIT/ML IJ SOLN
5000.0000 [IU] | Freq: Three times a day (TID) | INTRAMUSCULAR | Status: DC
  Administered 2012-08-03 – 2012-08-04 (×4): 5000 [IU] via SUBCUTANEOUS
  Filled 2012-08-03 (×7): qty 1

## 2012-08-03 MED ORDER — LEVOFLOXACIN IN D5W 750 MG/150ML IV SOLN
750.0000 mg | INTRAVENOUS | Status: DC
  Administered 2012-08-03: 750 mg via INTRAVENOUS
  Filled 2012-08-03 (×2): qty 150

## 2012-08-03 MED ORDER — SODIUM CHLORIDE 0.9 % IJ SOLN
3.0000 mL | Freq: Two times a day (BID) | INTRAMUSCULAR | Status: DC
  Administered 2012-08-03 (×3): 3 mL via INTRAVENOUS

## 2012-08-03 MED ORDER — ZOLPIDEM TARTRATE 5 MG PO TABS: 5.0000 mg | ORAL_TABLET | Freq: Every evening | ORAL | Status: DC | PRN

## 2012-08-03 MED ORDER — PANTOPRAZOLE SODIUM 40 MG PO TBEC
40.0000 mg | DELAYED_RELEASE_TABLET | Freq: Every day | ORAL | Status: DC
  Administered 2012-08-03 – 2012-08-04 (×2): 40 mg via ORAL
  Filled 2012-08-03 (×2): qty 1

## 2012-08-03 MED ORDER — PIPERACILLIN-TAZOBACTAM 3.375 G IVPB
3.3750 g | Freq: Three times a day (TID) | INTRAVENOUS | Status: DC
  Administered 2012-08-03 – 2012-08-04 (×4): 3.375 g via INTRAVENOUS
  Filled 2012-08-03 (×5): qty 50

## 2012-08-03 MED ORDER — LEVOTHYROXINE SODIUM 75 MCG PO TABS
75.0000 ug | ORAL_TABLET | Freq: Every day | ORAL | Status: DC
  Administered 2012-08-04: 75 ug via ORAL
  Filled 2012-08-03 (×2): qty 1

## 2012-08-03 MED ORDER — ALUM & MAG HYDROXIDE-SIMETH 200-200-20 MG/5ML PO SUSP: 30.0000 mL | Freq: Four times a day (QID) | ORAL | Status: DC | PRN

## 2012-08-03 MED ORDER — ALBUTEROL SULFATE (5 MG/ML) 0.5% IN NEBU
2.5000 mg | INHALATION_SOLUTION | RESPIRATORY_TRACT | Status: AC
  Administered 2012-08-03 (×3): 2.5 mg via RESPIRATORY_TRACT
  Filled 2012-08-03 (×3): qty 0.5

## 2012-08-03 MED ORDER — ALBUTEROL SULFATE HFA 108 (90 BASE) MCG/ACT IN AERS
1.0000 | INHALATION_SPRAY | RESPIRATORY_TRACT | Status: DC | PRN
  Administered 2012-08-04: 2 via RESPIRATORY_TRACT
  Filled 2012-08-03 (×3): qty 6.7

## 2012-08-03 MED ORDER — LEVOTHYROXINE SODIUM 175 MCG PO TABS
175.0000 ug | ORAL_TABLET | Freq: Every day | ORAL | Status: DC
  Administered 2012-08-03: 175 ug via ORAL
  Filled 2012-08-03 (×2): qty 1

## 2012-08-03 MED ORDER — LISINOPRIL 10 MG PO TABS
10.0000 mg | ORAL_TABLET | Freq: Every day | ORAL | Status: DC
  Administered 2012-08-03 – 2012-08-04 (×2): 10 mg via ORAL
  Filled 2012-08-03 (×2): qty 1

## 2012-08-03 MED ORDER — ONDANSETRON HCL 4 MG/2ML IJ SOLN: 4.0000 mg | Freq: Four times a day (QID) | INTRAMUSCULAR | Status: DC | PRN

## 2012-08-03 MED ORDER — GUAIFENESIN ER 600 MG PO TB12
600.0000 mg | ORAL_TABLET | Freq: Two times a day (BID) | ORAL | Status: DC
  Administered 2012-08-03 (×3): 600 mg via ORAL
  Administered 2012-08-04: 10:00:00 via ORAL
  Filled 2012-08-03 (×5): qty 1

## 2012-08-03 MED ORDER — ACETAMINOPHEN 650 MG RE SUPP: 650.0000 mg | Freq: Four times a day (QID) | RECTAL | Status: DC | PRN

## 2012-08-03 MED ORDER — POTASSIUM CHLORIDE CRYS ER 20 MEQ PO TBCR
40.0000 meq | EXTENDED_RELEASE_TABLET | Freq: Once | ORAL | Status: AC
  Administered 2012-08-03: 40 meq via ORAL
  Filled 2012-08-03: qty 2

## 2012-08-03 MED ORDER — IBUPROFEN 400 MG PO TABS
400.0000 mg | ORAL_TABLET | ORAL | Status: DC | PRN
  Administered 2012-08-03: 400 mg via ORAL
  Filled 2012-08-03 (×2): qty 1

## 2012-08-03 MED ORDER — ACETAMINOPHEN 325 MG PO TABS
650.0000 mg | ORAL_TABLET | Freq: Four times a day (QID) | ORAL | Status: DC | PRN
  Administered 2012-08-03: 650 mg via ORAL
  Filled 2012-08-03: qty 2

## 2012-08-03 NOTE — Progress Notes (Signed)
57yo female w/ h/o laryngectomy d/t throat Ca, c/o severe SOB, CT of chest reveals atelectasis vs infection, to begin IV ABX.  Will begin Zosyn 3.375g IV Q8H for CrCl ~85 ml/min.  Vernard Gambles, PharmD, BCPS 08/03/2012 2:54 AM

## 2012-08-03 NOTE — ED Notes (Signed)
Vancomycin sent with [facility].

## 2012-08-03 NOTE — Progress Notes (Signed)
PGY 1 Update: Pt NAD sitting in bed, comfortable, breathing increased but improved since yesterday.  PE: Filed Vitals:   08/03/12 0140 08/03/12 0343 08/03/12 0436 08/03/12 0834  BP: 138/95 147/86 140/80 145/87  Pulse: 108 86 103 109  Temp: 98.7 F (37.1 C) 98.6 F (37 C)  99.4 F (37.4 C)  TempSrc: Oral Oral  Oral  Resp: 22 19 16  33  Height: 5\' 7"  (1.702 m)     Weight: 178 lb 12.7 oz (81.1 kg)     SpO2:  95% 96% 98%   Gen: NAD Resp: Increased respiratory effort, decreased breath sounds R lung base > L lung base, diffuse wheezing, rhonchi upper airways Cardio: Tachycardia, regular rhythm, no murmurs appreciated  A/P 57 y/o female with asthma and HCAP  - Continue IV zosyn, Vanc, Levaquin  - Will start albuterol scheduled q 4 hrs and q 4 hrs PRN  -Continue monitor CBC, today no leukocytosis  -If improvement in vital signs today, will transfer out to floor  [place] R. Paulina Fusi, DO of [facility] 08/03/2012, 9:33 AM

## 2012-08-03 NOTE — H&P (Signed)
Family Medicine Teaching Service History and Physical Service Pager [phone]  Deborah [name] is an 57 y.o. female.    [name], MD   Chief Complaint: Difficulty Breathing HPI: 57 year old female who presented to the ER earlier this evening with 4 days if increased sputum production and cough, subjective fever/chills, that was acutely worse prompting ER visit.  She says she could not breathe.  She has a tracheostomy s/p throat cancer.  She denies sick contacts, and did receive a flu shot this year.  She was placed on oxygen when she arrived to the ER because she was hypoxic.  She uses a humidifier for the tracheostomy at home, but does not normally use oxygen.  She says she has some pain with coughing and nausea, but otherwise she denies chest pain, abdominal pain, diarrhea.   Past Medical History  Diagnosis Date  . Hypertension   . Tracheostomy dependent   . Gastrostomy feeding   . History of laryngectomy   . Bronchitis   . 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  [name] Heart murmur     asymptomatic   . Leukocytopenia   . Asthma   . Sinusitis, chronic 07/20/2011  . Normal MRI 07/14/11    negative for mestasis   . Hx of radiation therapy 09/03/10 to 10/16/2010    supraglottic larynx  . Seizures     07/24/11 off Effexor w/o seizure  . 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 Neck pain 01/21/2012    Past Surgical History  Procedure Laterality Date  . Portacath placement  09/17/10    Tip in cavoatrial junction  . Laryngectomy    . Tracheal dilitation  07/16/2011    Procedure: TRACHEAL DILITATION;  Surgeon: [name], MD;  Location: [facility] OR;  Service: ENT;  Laterality: N/A;  dilation of tracheal stoma and replacement of stoma tube  . Foreign body removal bronchial  10/02/2011    Procedure: REMOVAL FOREIGN BODY BRONCHIAL;  Surgeon: Melvenia Beam, MD;  Location: [facility] OR;  Service: ENT;  Laterality: N/A;  . Esophagoscopy  06/21/2012     Procedure: ESOPHAGOSCOPY;  Surgeon: Serena Colonel, MD;  Location: [facility];  Service: ENT;  Laterality: N/A;    Family History  Problem Relation Age of Onset  . Heart disease Mother   . Heart disease Father   . Heart disease Sister    Social History:  reports that she quit smoking about 22 months ago. Her smoking use included Cigarettes. She smoked 0.00 packs per day. She does not have any smokeless tobacco history on file. She reports that she does not drink alcohol or use illicit drugs.  Allergies: No Known Allergies  Medications Prior to Admission  Medication Sig Dispense Refill  . cyclobenzaprine (FLEXERIL) 10 MG tablet Take 1 tablet (10 mg total) by mouth 2 (two) times daily as needed for muscle spasms.  30 tablet  2  . levothyroxine (SYNTHROID) 175 MCG tablet Take 1 tablet (175 mcg total) by mouth daily.  30 tablet  2  . lisinopril (PRINIVIL,ZESTRIL) 10 MG tablet Take 1 tablet (10 mg total) by mouth daily.  90 tablet  1  . metoprolol tartrate (LOPRESSOR) 25 MG tablet Take 1 tablet (25 mg total) by mouth 2 (two) times daily.  180 tablet  1  . omeprazole (PRILOSEC) 40 MG capsule Take 1 capsule (40 mg total) by mouth daily.  30 capsule  3  .  promethazine (PHENERGAN) 25 MG tablet Take 1 tablet (25 mg total) by mouth every 8 (eight) hours as needed for nausea.  20 tablet  0    Results for orders placed during the hospital encounter of 08/02/12 (from the past 48 hour(s))  BLOOD GAS, ARTERIAL     Status: Abnormal   Collection Time    08/02/12  7:59 PM      Result Value Range   FIO2 0.40     Delivery systems TRACH COLLAR/TRACH TUBE     pH, Arterial 7.445  7.350 - 7.450   pCO2 arterial 39.8  35.0 - 45.0 mmHg   pO2, Arterial 94.5  80.0 - 100.0 mmHg   Bicarbonate 27.0 (*) 20.0 - 24.0 mEq/L   TCO2 24.5  0 - 100 mmol/L   Acid-Base Excess 3.1 (*) 0.0 - 2.0 mmol/L   O2 Saturation 97.4     Patient temperature 98.1     Collection site RIGHT RADIAL     Drawn by [phone]      Sample type ARTERIAL     Allens test (pass/fail) PASS  PASS  LACTIC ACID, PLASMA     Status: None   Collection Time    08/02/12  8:14 PM      Result Value Range   Lactic Acid, Venous 1.3  0.5 - 2.2 mmol/L  PROTIME-INR     Status: None   Collection Time    08/02/12  8:14 PM      Result Value Range   Prothrombin Time 13.1  11.6 - 15.2 seconds   INR 1.00  0.00 - 1.49  APTT     Status: None   Collection Time    08/02/12  8:14 PM      Result Value Range   aPTT 24  24 - 37 seconds  COMPREHENSIVE METABOLIC PANEL     Status: Abnormal   Collection Time    08/02/12  8:14 PM      Result Value Range   Sodium 139  135 - 145 mEq/L   Potassium 3.4 (*) 3.5 - 5.1 mEq/L   Chloride 101  96 - 112 mEq/L   CO2 28  19 - 32 mEq/L   Glucose, Bld 107 (*) 70 - 99 mg/dL   BUN 11  6 - 23 mg/dL   Creatinine, Ser 0.45  0.50 - 1.10 mg/dL   Calcium 9.0  8.4 - 40.9 mg/dL   Total Protein 8.2  6.0 - 8.3 g/dL   Albumin 3.3 (*) 3.5 - 5.2 g/dL   AST 35  0 - 37 U/L   ALT 27  0 - 35 U/L   Alkaline Phosphatase 109  39 - 117 U/L   Total Bilirubin 0.2 (*) 0.3 - 1.2 mg/dL   GFR calc non Af Amer 81 (*) >90 mL/min   GFR calc Af Amer >90  >90 mL/min   Comment:            The eGFR has been calculated     using the CKD EPI equation.     This calculation has not been     validated in all clinical     situations.     eGFR's persistently     <90 mL/min signify     possible Chronic Kidney Disease.  CBC WITH DIFFERENTIAL     Status: Abnormal   Collection Time    08/02/12  8:14 PM      Result Value Range   WBC 6.5  4.0 - 10.5  K/uL   RBC 3.84 (*) 3.87 - 5.11 MIL/uL   Hemoglobin 10.9 (*) 12.0 - 15.0 g/dL   HCT 45.4 (*) 09.8 - 11.9 %   MCV 87.0  78.0 - 100.0 fL   MCH 28.4  26.0 - 34.0 pg   MCHC 32.6  30.0 - 36.0 g/dL   RDW 14.7  82.9 - 56.2 %   Platelets    150 - 400 K/uL   Value: PLATELET CLUMPS NOTED ON SMEAR, COUNT APPEARS ADEQUATE   Neutrophils Relative 68  43 - 77 %   Neutro Abs 4.4  1.7 - 7.7 K/uL    Lymphocytes Relative 14  12 - 46 %   Lymphs Abs 0.9  0.7 - 4.0 K/uL   Monocytes Relative 9  3 - 12 %   Monocytes Absolute 0.6  0.1 - 1.0 K/uL   Eosinophils Relative 9 (*) 0 - 5 %   Eosinophils Absolute 0.6  0.0 - 0.7 K/uL   Basophils Relative 1  0 - 1 %   Basophils Absolute 0.0  0.0 - 0.1 K/uL   Ct Angio Chest Pe W/cm &/or Wo Cm  08/02/2012  *RADIOLOGY REPORT*  Clinical Data: Shortness of breath, cough.  CT ANGIOGRAPHY CHEST  Technique:  Multidetector CT imaging of the chest using the standard protocol during bolus administration of intravenous contrast. Multiplanar reconstructed images including MIPs were obtained and reviewed to evaluate the vascular anatomy.  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  Normal caliber aorta.  No dissection.  The main, lobar, and proximal segmental pulmonary arteries are patent.  The peripheral segmental and subsegmental branches are poorly opacified.  Right chest wall Port-A-Cath, with tip projecting at the cavoatrial junction.  Heart size within normal limits.  No pleural or pericardial effusion.  There are mildly prominent mediastinal and hilar lymph nodes, measuring up to 1.3 cm short axis subcarinal. Increased anterior mediastinal soft tissue.  Unchanged axillary lymph nodes.  Limited images through the upper abdomen show no acute finding.  Tracheostomy tube.  Small amount mucus/debris at the level of the carina.  Mild bilateral areas of ground-glass opacity and more confluent nodular opacity within the left lung base.  No pneumothorax.  No acute osseous finding.  IMPRESSION: No pulmonary arterial filling defect identified within the main, lobar, or proximal segmental branches.  The peripheral segmental and subsegmental branches are nondiagnostic.  Bilateral patchy areas of ground-glass opacity may reflect atelectasis or infection.   Clustered nodular left lung base opacity may reflect infection or aspiration.  There are mildly prominent mediastinal and hilar lymph nodes, measuring up to 1.3 cm short axis subcarinal. may be reactive however metastatic disease not excluded.  Recommend short-term follow-up.  Increased anterior mediastinal soft tissue. May reflect rebound thymic tissue.  Attention at follow-up.   Original Report Authenticated By: Jearld Lesch, M.D.    Dg Chest Port 1 View  08/02/2012  *RADIOLOGY REPORT*  Clinical Data: Shortness of breath.  PORTABLE CHEST - 1 VIEW  Comparison: 10/02/2011  Findings: Right IJ PowerPort extends to the distal SVC.  Airspace disease seen previously has largely resolved.  There is some mild bibasilar interstitial prominence.  Heart size is upper limits normal for technique.  No effusion.  Regional bones unremarkable.  IMPRESSION:  1.  Near complete resolution of the asymmetric airspace disease seen previously.   Original Report Authenticated By: D. Andria Rhein, MD  ROS: Negative except stated in HPI  Blood pressure 138/95, pulse 108, temperature 98.7 F (37.1 C), temperature source Oral, resp. rate 22, height 5\' 7"  (1.702 m), weight 178 lb 12.7 oz (81.1 kg), SpO2 95.00%. General appearance: alert, cooperative and no distress Eyes: PERRL, EOMIT Neck: trach collar in place Lungs: decreased breath sounds in bases, no wheezing Heart: Tachycardic but no m/r/g Abdomen: soft, non-tender; bowel sounds normal; no masses,  no organomegaly Extremities: extremities normal, atraumatic, no cyanosis or edema  Assessment/Plan ALIZIA GREIF is a 57 y.o. female with PMHx of Asthma, tracheostomy for throat cancer who presents with hypoxia requiring oxygen at 40% FiO2, and chest CT concerning for Pneumonia, and vital signs meeting Sirs Criteria:   #SIRS: patient tachycardic, with source of infection, but normotensive and WBC WNL. - s/p NS bolus in ER with some improvement in tachycardia,  will continue fluid resuscitation with MIVF, admit to step down unit.   #Pulm: will treat for HCAP given patient's tracheostomy, she has received Vancomycin and Levofloxacin which I will continue.  -continue oxygen per trach, humidified, with suctioning as needed - incentive spirometry, mucinex   # Hypertension: Normal to elevated blood pressures, continue home BP medications  # Hypothyroidism: continue home synthroid, but given tachycardia and most recent TSH elevated, will check TSH.   #Anemia: Normocytic,unclear if this is anemia of chronic disease related to cancer, iron deficiency etc.  Will monitor hemoglobin, and consider outpatient work up.   # FEN/GI: Heart healthy diet, NS@ 100/hr, monitor electrolytes  # Code Status:Full Code  #PPx: SQ Heparin  #Disposition: Pending Clinical Improvement  Deborah [name] 08/03/2012, 2:48 AM

## 2012-08-03 NOTE — Progress Notes (Signed)
Utilization Review Completed.  08/03/2012

## 2012-08-03 NOTE — H&P (Signed)
Seen and examined.  Discussed with Dr. Lula Olszewski.  Agree with her admit, documentation and management.  Briefly, 57 yo female with known throat cancer S/P surg and radiation presents with worsening cough and SOB. Issues:  1. Pneumonia explains symptoms.  She is not a normal host with her throat cancer and treatments.  I think it is wise to conceptualize her as HCAP and treat as such.  We also ought to consider that she might have an element of aspiration.  She has chronic upper esophageal/oropharyngial dysphagia since her surg and radiation.  I need to better understand previous work up.  She does not seem to have had recurrent aspiration pneumonia but might still be at risk.  Agree with admit and antibiotics.

## 2012-08-04 DIAGNOSIS — Z87891 Personal history of nicotine dependence: Secondary | ICD-10-CM

## 2012-08-04 DIAGNOSIS — Z90710 Acquired absence of both cervix and uterus: Secondary | ICD-10-CM | POA: Diagnosis present

## 2012-08-04 DIAGNOSIS — A419 Sepsis, unspecified organism: Secondary | ICD-10-CM | POA: Diagnosis present

## 2012-08-04 DIAGNOSIS — J189 Pneumonia, unspecified organism: Secondary | ICD-10-CM | POA: Diagnosis present

## 2012-08-04 HISTORY — DX: Sepsis, unspecified organism: A41.9

## 2012-08-04 MED ORDER — ALBUTEROL SULFATE HFA 108 (90 BASE) MCG/ACT IN AERS: 1.0000 | INHALATION_SPRAY | RESPIRATORY_TRACT | Status: DC | PRN

## 2012-08-04 MED ORDER — GUAIFENESIN ER 600 MG PO TB12: 600.0000 mg | ORAL_TABLET | Freq: Two times a day (BID) | ORAL | Status: DC

## 2012-08-04 MED ORDER — LEVOFLOXACIN 750 MG PO TABS: 750.0000 mg | ORAL_TABLET | Freq: Every day | ORAL | Status: DC

## 2012-08-04 MED ORDER — LEVOTHYROXINE SODIUM 75 MCG PO TABS: 75.0000 ug | ORAL_TABLET | Freq: Every day | ORAL | Status: DC

## 2012-08-04 NOTE — Progress Notes (Signed)
FMTS Attending Daily Note:  Renold Don MD  [phone] pager  Family Practice pager:  [phone] I have seen and examined this patient and have reviewed their chart. I have discussed this patient with the resident. I agree with the resident's findings, assessment and care plan.  See my separate note for details.

## 2012-08-04 NOTE — Progress Notes (Signed)
PGY-1 Daily Progress Note Family Medicine Teaching Service [place] R. Delmore Sear, DO Service Pager: [phone]   Subjective: Pt doing well, breathing better today and not having wheezing.   Objective:  VITALS Temp:  [98.1 F (36.7 C)-99.4 F (37.4 C)] 98.2 F (36.8 C) (02/19 0400) Pulse Rate:  [69-109] 79 (02/19 0400) Resp:  [9-49] 32 (02/19 0400) BP: (91-145)/(49-90) 113/72 mmHg (02/19 0400) SpO2:  [93 %-100 %] 95 % (02/19 0400) FiO2 (%):  [40 %] 40 % (02/19 0308)  In/Out  Intake/Output Summary (Last 24 hours) at 08/04/12 0803 Last data filed at 08/04/12 0700  Gross per 24 hour  Intake   2600 ml  Output   3200 ml  Net   -600 ml    Physical Exam: Gen:  NAD HEENT: moist mucous membranes, Tracheostomy in place, no S/Sx of infection  CV: Regular rate and rhythm, no murmurs rubs or gallops PULM: clear to auscultation bilaterally. No wheezes/rales/rhonchi ABD: soft/nontender/nondistended/normal bowel sounds EXT: No edema Neuro: Alert and oriented x3  MEDS Scheduled Meds: . guaiFENesin  600 mg Oral BID  . heparin  5,000 Units Subcutaneous Q8H  . levofloxacin (LEVAQUIN) IV  750 mg Intravenous Q24H  . levothyroxine  75 mcg Oral QAC breakfast  . lisinopril  10 mg Oral Daily  . metoprolol tartrate  25 mg Oral BID  . pantoprazole  40 mg Oral Daily  . piperacillin-tazobactam (ZOSYN)  IV  3.375 g Intravenous Q8H  . sodium chloride  3 mL Intravenous Q12H   Continuous Infusions: . sodium chloride 100 mL/hr at 08/04/12 0442   PRN Meds:.acetaminophen, acetaminophen, albuterol, alum & mag hydroxide-simeth, ibuprofen, ondansetron (ZOFRAN) IV, ondansetron, polyethylene glycol, zolpidem  Labs and imaging:   CBC  Recent Labs Lab 08/02/12 2014 08/03/12 0500  WBC 6.5 3.8*  HGB 10.9* 10.6*  HCT 33.4* 32.6*  PLT PLATELET CLUMPS NOTED ON SMEAR, COUNT APPEARS ADEQUATE 259   BMET/CMET  Recent Labs Lab 08/02/12 2014 08/03/12 0500  NA 139 141  K 3.4* 3.4*  CL 101 103  CO2 28 28   BUN 11 9  CREATININE 0.81 0.81  CALCIUM 9.0 9.3  PROT 8.2  --   BILITOT 0.2*  --   ALKPHOS 109  --   ALT 27  --   AST 35  --   GLUCOSE 107* 94   No results found for this or any previous visit (from the past 24 hour(s)). Ct Angio Chest Pe W/cm &/or Wo Cm  08/02/2012  *RADIOLOGY REPORT*  Clinical Data: Shortness of breath, cough.  CT ANGIOGRAPHY CHEST  Technique:  Multidetector CT imaging of the chest using the standard protocol during bolus administration of intravenous contrast. Multiplanar reconstructed images including MIPs were obtained and reviewed to evaluate the vascular anatomy.  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  Normal caliber aorta.  No dissection.  The main, lobar, and proximal segmental pulmonary arteries are patent.  The peripheral segmental and subsegmental branches are poorly opacified.  Right chest wall Port-A-Cath, with tip projecting at the cavoatrial junction.  Heart size within normal limits.  No pleural or pericardial effusion.  There are mildly prominent mediastinal and hilar lymph nodes, measuring up to 1.3 cm short axis subcarinal. Increased anterior mediastinal soft tissue.  Unchanged axillary lymph nodes.  Limited images through the upper abdomen show no acute finding.  Tracheostomy tube.  Small amount mucus/debris at the level of  the carina.  Mild bilateral areas of ground-glass opacity and more confluent nodular opacity within the left lung base.  No pneumothorax.  No acute osseous finding.  IMPRESSION: No pulmonary arterial filling defect identified within the main, lobar, or proximal segmental branches.  The peripheral segmental and subsegmental branches are nondiagnostic.  Bilateral patchy areas of ground-glass opacity may reflect atelectasis or infection.  Clustered nodular left lung base opacity may reflect infection or  aspiration.  There are mildly prominent mediastinal and hilar lymph nodes, measuring up to 1.3 cm short axis subcarinal. may be reactive however metastatic disease not excluded.  Recommend short-term follow-up.  Increased anterior mediastinal soft tissue. May reflect rebound thymic tissue.  Attention at follow-up.   Original Report Authenticated By: Jearld Lesch, M.D.    Dg Chest Port 1 View  08/02/2012  *RADIOLOGY REPORT*  Clinical Data: Shortness of breath.  PORTABLE CHEST - 1 VIEW  Comparison: 10/02/2011  Findings: Right IJ PowerPort extends to the distal SVC.  Airspace disease seen previously has largely resolved.  There is some mild bibasilar interstitial prominence.  Heart size is upper limits normal for technique.  No effusion.  Regional bones unremarkable.  IMPRESSION:  1.  Near complete resolution of the asymmetric airspace disease seen previously.   Original Report Authenticated By: D. Andria Rhein, MD         Assessment  Pt is a 57 y.o. patient with significant PMHx for asthma admitted with Sepsis secondary to PNA.    Plan:  1.Sepsis secondary to Healthcare associated Pneumonia   1) Will consider switching from Vanc/Zosyn/Levaquin for HAP to Levaquin considering pt has been afebrile for >24 hrs.   2) Transfer from SDU to Med Surg floor  3) Continue monitoring pulse Ox with vitals and O2 to maintain sat >90%.    4) CBC daily along with BMP  5)BCx pending 2. Hypothyroidism with decreased TSH - Pt was being overtreated with Synthroid.  Decreased her dose from 175 to 75 mcg qd.  Will need follow up in 6 weeks for TSH and readjustment.  3. HTN   1) Continue home meds  2) BP has been well controlled, consider holding meds if becomes borderline hypotensive 4. Anemia   1) Hgb stable around 10 (admission 10.9)  2) Will recheck CBC today  3) Consider Iron studies inpatient vs outpatient for further w/u.  5. Asthma - Stable, Albuterol q 4 hrs PRN FEN/GI: NS 100 cc/hr Prophylaxis:  SQ  Hep Disposition: Transfer to Regular Floor today, will transition ABx today as well and if improvement, can d/c tomorrow Code Status: Full  Eulon Allnutt R. Paulina Fusi, DO of [facility] 08/04/2012, 8:12 AM

## 2012-08-04 NOTE — Discharge Summary (Signed)
Physician Discharge Summary  Patient ID: Deborah [name] MRN: [id number] DOB: May 31, 1956 Age: 57 y.o.  Admit date: 08/02/2012 Discharge date: 08/04/2012 Admitting Physician: Sanjuana Letters, MD  PCP: [name], MD  Consultants:None      Discharge Diagnosis: Principal Problem:   Sepsis(995.91) Active Problems:   Hypothyroidism   HTN (hypertension)   Former smoker   HCAP (healthcare-associated pneumonia)   Status post trachelectomy    Hospital Course  Deborah [name] is a 57 y.o. female with PMHx of Asthma, tracheostomy for throat cancer who presented with hypoxia requiring oxygen at 40% FiO2, and chest CT concerning for Pneumonia, and admitted to the hospital with Sepsis due to PNA and vital signs  1) Sepsis secondary to Healthcare associated Pneumonia -  Pt initially presented to the ED with shortness of breath, increased sputum production, cough, fever.  Upon presentation, pt was hypoxic into the upper 80s, tachycardic and there was concern for a Pulmonary Embolus.  A CT angiogram was performed that did not show a PE but did show B/L patchy areas of ground glass opacities concerning for infection and also some prominent mediastinal and hilar lymphadenopathy.  She was diagnosed with sepsis due to her tachycardia, and respiratory rate along with source of infection and BCx were drawn, a CBC was drawn not showing a leukocytosis, and she was started on Vancomycin and Levaquin for HCAP along with a 1L NS bolus.  She was continued on oxygen, given incentive spirometry, and Mucinex and the following day zosyn was added for anaerobic and pseudomas coverage.  Pt had decreased breath sounds in the lower lung fields and had some wheezing and was also given albuterol at this point.  Pt started to improve, was afebrile during her hospital stay, her heart rate became normalized, respiration became normalized, and did not require oxygen and was saturating on RA > 98%.  She was switched  to Levaquin 750 mg PO x 7 more days for a total of 10 days.  Pt was also sent home on albuterol inhaler to help with shortness of breath.    2. Hypothyroidism - Pt came in with tachycardia and elevated BP.  Due to her elevated BP despite sepsis and with tachycardia, pt had a TSH drawn showing TSH <0.008.  Her home Synthroid of 175 mcg was decreased to 75 mcg per day and pt was not having symptoms of hyperthyroidism at the time of discharge.    3. HTN - Her BP medications were continued while in the hospital.  Her pressures were stable prior to d/c.  4. Normocytic Anemia - Pt Hgb decreased to 9.2 on admission with MCV > 80.  This was monitored daily and her Hgb prior to d/c was 10.6.  Unsure whether this was acute iron deficiency anemia or anemia of chronic disease.    5. Hx of Asthma - Pt started to have wheezing on hospital day number two.  She was started on albuterol q 4hrs and did well with this.  Pt sent home on albuterol.         Discharge PE   Filed Vitals:   08/04/12 1148  BP: 140/75  Pulse: 78  Temp: 98.6 F (37 C)  Resp: 22   Gen: NAD  HEENT: moist mucous membranes, Tracheostomy in place, no S/Sx of infection  CV: RRR, no murmurs rubs or gallops  PULM: Decreased Breath sounds, slight in Bibasilar region. No wheezes/rales/rhonchi  ABD: soft/nontender/nondistended/normal bowel sounds  EXT: No edema  Neuro: Alert and oriented  x3      Procedures/Imaging:  Ct Angio Chest Pe W/cm &/or Wo Cm  08/02/2012 IMPRESSION: No pulmonary arterial filling defect identified within the main, lobar, or proximal segmental branches.  The peripheral segmental and subsegmental branches are nondiagnostic.  Bilateral patchy areas of ground-glass opacity may reflect atelectasis or infection.  Clustered nodular left lung base opacity may reflect infection or aspiration.  There are mildly prominent mediastinal and hilar lymph nodes, measuring up to 1.3 cm short axis subcarinal. may be reactive however  metastatic disease not excluded.  Recommend short-term follow-up.  Increased anterior mediastinal soft tissue. May reflect rebound thymic tissue.  Attention at follow-up.   Original Report Authenticated By: Jearld Lesch, M.D.    Dg Chest Port 1 View  08/02/2012   IMPRESSION:  1.  Near complete resolution of the asymmetric airspace disease seen previously.   Original Report Authenticated By: D. Andria Rhein, MD     Labs  CBC  Recent Labs Lab 08/02/12 2014 08/03/12 0500  WBC 6.5 3.8*  HGB 10.9* 10.6*  HCT 33.4* 32.6*  PLT PLATELET CLUMPS NOTED ON SMEAR, COUNT APPEARS ADEQUATE 259   BMET  Recent Labs Lab 08/02/12 2014 08/03/12 0500  NA 139 141  K 3.4* 3.4*  CL 101 103  CO2 28 28  BUN 11 9  CREATININE 0.81 0.81  CALCIUM 9.0 9.3  PROT 8.2  --   BILITOT 0.2*  --   ALKPHOS 109  --   ALT 27  --   AST 35  --   GLUCOSE 107* 94   Results for orders placed during the hospital encounter of 08/02/12 (from the past 72 hour(s))  BLOOD GAS, ARTERIAL     Status: Abnormal   Collection Time    08/02/12  7:59 PM      Result Value Range   FIO2 0.40     Delivery systems TRACH COLLAR/TRACH TUBE     pH, Arterial 7.445  7.350 - 7.450   pCO2 arterial 39.8  35.0 - 45.0 mmHg   pO2, Arterial 94.5  80.0 - 100.0 mmHg   Bicarbonate 27.0 (*) 20.0 - 24.0 mEq/L   TCO2 24.5  0 - 100 mmol/L   Acid-Base Excess 3.1 (*) 0.0 - 2.0 mmol/L   O2 Saturation 97.4     Patient temperature 98.1     Collection site RIGHT RADIAL     Drawn by [phone]     Sample type ARTERIAL     Allens test (pass/fail) PASS  PASS  LACTIC ACID, PLASMA     Status: None   Collection Time    08/02/12  8:14 PM      Result Value Range   Lactic Acid, Venous 1.3  0.5 - 2.2 mmol/L  CULTURE, BLOOD (ROUTINE X 2)     Status: None   Collection Time    08/02/12  8:25 PM      Result Value Range   Specimen Description BLOOD LEFT ANTECUBITAL     Special Requests BOTTLES DRAWN AEROBIC AND ANAEROBIC 5CC EACH     Culture  Setup Time  08/03/2012 00:32     Culture       Value:        BLOOD CULTURE RECEIVED NO GROWTH TO DATE CULTURE WILL BE HELD FOR 5 DAYS BEFORE ISSUING A FINAL NEGATIVE REPORT   Report Status PENDING    TSH     Status: Abnormal   Collection Time    08/03/12  5:00 AM  Result Value Range   TSH <0.008 (*) 0.350 - 4.500 uIU/mL       Patient condition at time of discharge/disposition: stable  Disposition-home   Follow up issues: 1. Pneumonia/Dyspnea - On Levaquin, Mucinex, and Albuterol.  Follow up BCx 2. TSH < 0.008 on admission.  Decreased Synthroid from 175 to 75 mg.  Will need repeat TSH in 6 weeks 3. Mediastinal Lymph node Enlargement on CT, will need evaluation at her Onc appointment on 08/23/12.   4. Normocytic Anemia, may be iron deficient.  Repeat CBC  Discharge follow up:  Follow-up Information   Follow up with [name], MD. (2/26 at 2 PM )    Contact information:   [address] [place] [address] [phone]      Discharge Orders   Future Appointments Provider Department Dept Phone   08/11/2012 2:00 PM [name], MD [facility] [phone]   08/23/2012 10:15 AM Dava Najjar [facility] [phone]   08/23/2012 10:45 AM Myrtis Ser, NP [place] [facility] [phone]   Future Orders Complete By Expires     Call MD for:  difficulty breathing, headache or visual disturbances  As directed     Call MD for:  persistant nausea and vomiting  As directed     Call MD for:  temperature >100.4  As directed     Diet - low sodium heart healthy  As directed     Increase activity slowly  As directed         Discharge Instructions: Please refer to Patient Instructions section of EMR for full details.  Patient was counseled important signs and symptoms that should prompt return to medical care, changes in medications, dietary instructions, activity restrictions, and follow up  appointments.  Significant instructions noted below:    Discharge Medications   Medication List    TAKE these medications       albuterol 108 (90 BASE) MCG/ACT inhaler  Commonly known as:  PROVENTIL HFA;VENTOLIN HFA  Inhale 1-2 puffs into the lungs every 4 (four) hours as needed for wheezing or shortness of breath.     cyclobenzaprine 10 MG tablet  Commonly known as:  FLEXERIL  Take 1 tablet (10 mg total) by mouth 2 (two) times daily as needed for muscle spasms.     guaiFENesin 600 MG 12 hr tablet  Commonly known as:  MUCINEX  Take 1 tablet (600 mg total) by mouth 2 (two) times daily.     levofloxacin 750 MG tablet  Commonly known as:  LEVAQUIN  Take 1 tablet (750 mg total) by mouth daily.     levothyroxine 75 MCG tablet  Commonly known as:  SYNTHROID, LEVOTHROID  Take 1 tablet (75 mcg total) by mouth daily before breakfast.     lisinopril 10 MG tablet  Commonly known as:  PRINIVIL,ZESTRIL  Take 1 tablet (10 mg total) by mouth daily.     metoprolol tartrate 25 MG tablet  Commonly known as:  LOPRESSOR  Take 1 tablet (25 mg total) by mouth 2 (two) times daily.     omeprazole 40 MG capsule  Commonly known as:  PRILOSEC  Take 1 capsule (40 mg total) by mouth daily.     promethazine 25 MG tablet  Commonly known as:  PHENERGAN  Take 1 tablet (25 mg total) by mouth every 8 (eight) hours as needed for nausea.  Gildardo Cranker, DO of [facility] 08/04/2012 11:43 AM

## 2012-08-04 NOTE — Progress Notes (Signed)
S:  Patient much improved.  Doesn't need O2, keeping sats up.  Feels "worlds better" than when she came in.  Wants to go home  Exam: Gen:  AAF lying in bed, trach in place.  Satting 99% off Oxygen. Neck:  No bleeding from tracheostomy site Heart:  RRR  Lungs:  Minimal decreased BS at bases.  Otherwise essentially clear Ext:  No edema  Imp/Plan: 1.  HCAP - afebrile, no leukocytosis, no further O2 requirement 2.  Asthma 3. SIRS - resolved  - Switch to PO antibiotics  - ICS - As doing much better, can likely DC home today   Tobey Grim, MD 9:34 AM 08/04/2012

## 2012-08-05 ENCOUNTER — Other Ambulatory Visit: Payer: Self-pay | Admitting: Oncology

## 2012-08-05 ENCOUNTER — Telehealth: Payer: Self-pay | Admitting: *Deleted

## 2012-08-05 NOTE — Telephone Encounter (Signed)
Opened in error

## 2012-08-06 NOTE — Discharge Summary (Signed)
Family Medicine Teaching Service  Discharge Note : Attending Jeff Armonte Tortorella MD Pager [phone] Inpatient Team Pager:  [phone]  I have seen and examined this patient, reviewed their chart and discussed discharge planning with the resident at the time of discharge. I agree with the discharge plan as above.  

## 2012-08-09 LAB — CULTURE, BLOOD (ROUTINE X 2): Culture: NO GROWTH

## 2012-08-11 ENCOUNTER — Ambulatory Visit (INDEPENDENT_AMBULATORY_CARE_PROVIDER_SITE_OTHER): Payer: Medicaid Other | Admitting: Family Medicine

## 2012-08-11 ENCOUNTER — Encounter: Payer: Self-pay | Admitting: Family Medicine

## 2012-08-11 VITALS — BP 163/96 | HR 89 | Temp 98.0°F | Ht 67.0 in | Wt 178.0 lb

## 2012-08-11 DIAGNOSIS — D638 Anemia in other chronic diseases classified elsewhere: Secondary | ICD-10-CM | POA: Insufficient documentation

## 2012-08-11 DIAGNOSIS — D649 Anemia, unspecified: Secondary | ICD-10-CM

## 2012-08-11 DIAGNOSIS — J189 Pneumonia, unspecified organism: Secondary | ICD-10-CM

## 2012-08-11 DIAGNOSIS — I10 Essential (primary) hypertension: Secondary | ICD-10-CM

## 2012-08-11 LAB — CBC
Platelets: 468 10*3/uL — ABNORMAL HIGH (ref 150–400)
RBC: 3.74 MIL/uL — ABNORMAL LOW (ref 3.87–5.11)
RDW: 13.7 % (ref 11.5–15.5)
WBC: 5.3 10*3/uL (ref 4.0–10.5)

## 2012-08-11 LAB — FERRITIN: Ferritin: 178 ng/mL (ref 10–291)

## 2012-08-11 MED ORDER — LISINOPRIL 20 MG PO TABS: 20.0000 mg | ORAL_TABLET | Freq: Every day | ORAL | Status: DC

## 2012-08-11 MED ORDER — BENZONATATE 200 MG PO CAPS: 200.0000 mg | ORAL_CAPSULE | Freq: Two times a day (BID) | ORAL | Status: DC | PRN

## 2012-08-11 MED ORDER — DOXYCYCLINE HYCLATE 100 MG PO TABS: 100.0000 mg | ORAL_TABLET | Freq: Two times a day (BID) | ORAL | Status: DC

## 2012-08-11 MED ORDER — GUAIFENESIN 100 MG/5ML PO LIQD: 200.0000 mg | Freq: Three times a day (TID) | ORAL | Status: DC | PRN

## 2012-08-11 NOTE — Assessment & Plan Note (Signed)
A: reassuring lung exam. Persistent cough.  P:  doxycycline x 10 day course  Liquid mucinex  tessalon perles prn cough

## 2012-08-11 NOTE — Patient Instructions (Addendum)
Deborah [name],  Thank you for coming in to see me today.   For your cough: Tessalon persles Liquid mucinex-robitussin  Complete a 10 day course of doxycyline. This is a capsule.   For HTN:  Increase lisinopril to 20 mg once daily   Leg pain: unclear cause. Checking CBC and iron to see if anemia could be attributing to this.   F/u in 2 weeks   Dr. Armen Pickup

## 2012-08-11 NOTE — Assessment & Plan Note (Signed)
A: elevated BP and weight gain P: Increase lisinopril to 20 mg daily

## 2012-08-11 NOTE — Progress Notes (Signed)
Subjective:     Patient ID: [name], female   DOB: 1956-02-14, 57 y.o.   MRN: [id number]  HPI 57 yo F presents for same hospital follow up:  1. Hospitalized for CAP: has not completed Levaquin b/c pill too big to swallow. Also unable to swallow mucinex. Still with cough, intermittently productive. No CP or SOB. No fever.   2. Hypothyroidism: decreased synthroid dose. Patient experiencing weight gain and elevated BP. Weight gain started prior to dose change.   3. HTN: compliant with medication. No HA, CP, SOB or LE edema.   Review of Systems As per HPI  Reports bilateral lower leg pain when standing.     Objective:   Physical Exam BP 163/96  Pulse 89  Temp(Src) 98 F (36.7 C)  Ht 5\' 7"  (1.702 m)  Wt 178 lb (80.74 kg)  BMI 27.87 kg/m2 General appearance: alert, cooperative and no distress Lungs: clear to auscultation bilaterally Heart: regular rate and rhythm, S1, S2 normal, no murmur, click, rub or gallop Extremities: extremities normal, atraumatic, no cyanosis or edema Pulses: 2+ and symmetric DP pulses    Reviewed hospital course Blood cultures negative      Assessment and Plan:

## 2012-08-11 NOTE — Assessment & Plan Note (Signed)
Check CBC and ferritin. Suspect iron deficiency.

## 2012-08-16 ENCOUNTER — Telehealth: Payer: Self-pay | Admitting: Family Medicine

## 2012-08-16 NOTE — Telephone Encounter (Signed)
Called patient. Left VM. Normal ferritin. Hgb stable.  No new meds/orders at this time.

## 2012-08-19 ENCOUNTER — Other Ambulatory Visit (HOSPITAL_COMMUNITY): Payer: Self-pay

## 2012-08-19 ENCOUNTER — Other Ambulatory Visit: Payer: Self-pay | Admitting: Lab

## 2012-08-23 ENCOUNTER — Other Ambulatory Visit (HOSPITAL_BASED_OUTPATIENT_CLINIC_OR_DEPARTMENT_OTHER): Payer: Medicaid Other | Admitting: Lab

## 2012-08-23 ENCOUNTER — Ambulatory Visit (HOSPITAL_BASED_OUTPATIENT_CLINIC_OR_DEPARTMENT_OTHER): Payer: Medicaid Other | Admitting: Oncology

## 2012-08-23 ENCOUNTER — Encounter: Payer: Self-pay | Admitting: Oncology

## 2012-08-23 VITALS — BP 123/77 | HR 73 | Temp 97.4°F | Resp 18 | Ht 67.0 in | Wt 175.9 lb

## 2012-08-23 DIAGNOSIS — D649 Anemia, unspecified: Secondary | ICD-10-CM

## 2012-08-23 DIAGNOSIS — D72819 Decreased white blood cell count, unspecified: Secondary | ICD-10-CM

## 2012-08-23 DIAGNOSIS — E039 Hypothyroidism, unspecified: Secondary | ICD-10-CM

## 2012-08-23 DIAGNOSIS — M542 Cervicalgia: Secondary | ICD-10-CM

## 2012-08-23 DIAGNOSIS — C321 Malignant neoplasm of supraglottis: Secondary | ICD-10-CM

## 2012-08-23 LAB — CBC WITH DIFFERENTIAL/PLATELET
Basophils Absolute: 0.1 10*3/uL (ref 0.0–0.1)
Eosinophils Absolute: 1.5 10*3/uL — ABNORMAL HIGH (ref 0.0–0.5)
HCT: 31.8 % — ABNORMAL LOW (ref 34.8–46.6)
HGB: 10.5 g/dL — ABNORMAL LOW (ref 11.6–15.9)
MONO#: 0.3 10*3/uL (ref 0.1–0.9)
NEUT#: 2.8 10*3/uL (ref 1.5–6.5)
RDW: 14 % (ref 11.2–14.5)
lymph#: 1.4 10*3/uL (ref 0.9–3.3)

## 2012-08-23 LAB — COMPREHENSIVE METABOLIC PANEL (CC13)
Albumin: 3.1 g/dL — ABNORMAL LOW (ref 3.5–5.0)
Alkaline Phosphatase: 90 U/L (ref 40–150)
CO2: 29 mEq/L (ref 22–29)
Calcium: 9 mg/dL (ref 8.4–10.4)
Chloride: 106 mEq/L (ref 98–107)
Glucose: 71 mg/dl (ref 70–99)
Potassium: 3.4 mEq/L — ABNORMAL LOW (ref 3.5–5.1)
Sodium: 143 mEq/L (ref 136–145)
Total Protein: 7.8 g/dL (ref 6.4–8.3)

## 2012-08-23 NOTE — Progress Notes (Signed)
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   PAST THERAPY: total laryngectomy and bilateral neck dissection on 07/31/2010. She received adjuvant chemoXRT with daily XRT and weekly cisplatin between 09/03/2010 and 10/01/2010. Chemotherapy ended two weeks prior to XRT due to poor toleration and dose limiting toxicity. Radiation concluded on 10/16/2010.   CURRENT THERAPY: watchful observation.   INTERVAL HISTORY: Deborah [name] 57 y.o. female returns for regular follow up by herself.  She was hospitalized last month for pneumonia. She had a CT scan which showed enlarged lymph nodes.(see below). She reports feeling relatively well.  States food is getting stuck. Had a barium swallow recently. She takes all nutrition by mouth and her weight is stable. She is being followed by Dr [name] and ST. Reports swelling in her neck, but no discrete masses. She no longer smokes cigarette, uses tobacco or drinks alcohol.  Patient denies fever, anorexia, weight loss, headache, visual changes, confusion, drenching night sweats, palpable lymph node swelling, mucositis, odynophagia, dysphagia, nausea vomiting, jaundice, chest pain, palpitation, shortness of breath, dyspnea on exertion, productive cough, gum bleeding, epistaxis, hematemesis, hemoptysis, abdominal pain, abdominal swelling, early satiety, melena, hematochezia, hematuria, skin rash, spontaneous bleeding, joint swelling, heat or cold intolerance, bowel bladder incontinence, back pain, focal motor weakness, paresthesia, depression, suicidal or homicidal ideation, feeling hopelessness.   Past Medical History  Diagnosis Date  . Hypertension   . Tracheostomy dependent   . Gastrostomy  feeding   . History of laryngectomy   . Bronchitis   . 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  [name] Heart murmur     asymptomatic   . Leukocytopenia   . Asthma   . Sinusitis, chronic 07/20/2011  . Normal MRI 07/14/11    negative for mestasis   . Hx of radiation therapy 09/03/10 to 10/16/2010    supraglottic larynx  . Seizures     07/24/11 off Effexor w/o seizure  . 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 Neck pain 01/21/2012    Past Surgical History  Procedure Laterality Date  . Portacath placement  09/17/10    Tip in cavoatrial junction  . Laryngectomy    . Tracheal dilitation  07/16/2011    Procedure: TRACHEAL DILITATION;  Surgeon: [name], MD;  Location: [facility] OR;  Service: ENT;  Laterality: N/A;  dilation of tracheal stoma and replacement of stoma tube  . Foreign body removal bronchial  10/02/2011    Procedure: REMOVAL FOREIGN BODY BRONCHIAL;  Surgeon: Melvenia Beam, MD;  Location: [facility] OR;  Service: ENT;  Laterality: N/A;  . Esophagoscopy  06/21/2012    Procedure: ESOPHAGOSCOPY;  Surgeon: Serena Colonel, MD;  Location: [facility];  Service: ENT;  Laterality: N/A;    Current Outpatient Prescriptions  Medication Sig Dispense Refill  . albuterol (PROVENTIL HFA;VENTOLIN HFA) 108 (90 BASE) MCG/ACT inhaler Inhale 1-2 puffs into the lungs every 4 (four) hours as needed for wheezing or shortness of breath.  1 Inhaler  0  . benzonatate (TESSALON) 200 MG capsule Take 1 capsule (200 mg total) by mouth 2 (two) times daily as needed for cough.  30 capsule  1  . buPROPion (WELLBUTRIN XL) 150 MG 24 hr tablet TAKE ONE TABLET  BY MOUTH EVERY DAY  90 tablet  0  . cyclobenzaprine (FLEXERIL) 10 MG tablet Take 1 tablet (10 mg total) by mouth 2 (two) times daily as needed for muscle spasms.  30 tablet  2  . doxycycline (VIBRA-TABS) 100 MG tablet Take 1 tablet (100 mg total) by mouth 2 (two) times daily.  20 tablet  0  . guaiFENesin (ROBITUSSIN) 100  MG/5ML liquid Take 10 mLs (200 mg total) by mouth 3 (three) times daily as needed for cough or congestion.  240 mL  0  . levothyroxine (SYNTHROID, LEVOTHROID) 75 MCG tablet Take 1 tablet (75 mcg total) by mouth daily before breakfast.  30 tablet  0  . lisinopril (PRINIVIL,ZESTRIL) 20 MG tablet Take 1 tablet (20 mg total) by mouth daily.  90 tablet  1  . metoprolol tartrate (LOPRESSOR) 25 MG tablet Take 1 tablet (25 mg total) by mouth 2 (two) times daily.  180 tablet  1  . omeprazole (PRILOSEC) 40 MG capsule Take 1 capsule (40 mg total) by mouth daily.  30 capsule  3  . promethazine (PHENERGAN) 25 MG tablet Take 1 tablet (25 mg total) by mouth every 8 (eight) hours as needed for nausea.  20 tablet  0   No current facility-administered medications for this visit.    ALLERGIES:  has No Known Allergies.  REVIEW OF SYSTEMS:  The rest of the 14-point review of system was negative.   Filed Vitals:   08/23/12 1055  BP: 123/77  Pulse: 73  Temp: 97.4 F (36.3 C)  Resp: 18   Wt Readings from Last 3 Encounters:  08/23/12 175 lb 14.4 oz (79.788 kg)  08/11/12 178 lb (80.74 kg)  08/03/12 178 lb 12.7 oz (81.1 kg)   ECOG Performance status: 1  PHYSICAL EXAMINATION:   General: well-nourished woman, in no acute distress. Eyes: no scleral icterus. ENT: There were no oropharyngeal lesions. Neck was without thyromegaly. Her neck was however fibrotic bilaterally. Her trach stoma was dry/clean/intact. Lymphatics: Negative cervical, supraclavicular or axillary adenopathy. Respiratory: lungs were clear bilaterally without wheezing or crackles. Cardiovascular: Regular rate and rhythm, S1/S2, without murmur, rub or gallop. There was no pedal edema. GI: abdomen was soft, flat, nontender, nondistended, without organomegaly. Muscoloskeletal: no spinal tenderness of palpation of vertebral spine especially cervical spine.  Skin exam was without echymosis, petichae. Neuro exam was nonfocal. Patient was able to get on  and off exam table without assistance. Gait was normal. Patient was alerted and oriented. Attention was good. Language was appropriate. Mood was normal without depression. Speech was not pressured. Thought content was not tangential.    LABORATORY/RADIOLOGY DATA:  Lab Results  Component Value Date   WBC 6.0 08/23/2012   HGB 10.5* 08/23/2012   HCT 31.8* 08/23/2012   PLT 281 08/23/2012   GLUCOSE 71 08/23/2012   CHOL 153 03/20/2011   TRIG 74 03/20/2011   HDL 53 03/20/2011   LDLCALC 85 03/20/2011   ALKPHOS 90 08/23/2012   ALT 10 08/23/2012   AST 13 08/23/2012   NA 143 08/23/2012   K 3.4* 08/23/2012   CL 106 08/23/2012   CREATININE 0.9 08/23/2012   BUN 11.7 08/23/2012   CO2 29 08/23/2012   INR 1.00 08/02/2012   IMAGING:   *RADIOLOGY REPORT*  Clinical Data: Shortness of breath, cough.  CT ANGIOGRAPHY CHEST  Technique: Multidetector CT imaging of the chest using the  standard protocol during bolus administration of intravenous  contrast. Multiplanar reconstructed images including MIPs were  obtained and  reviewed to evaluate the vascular anatomy.  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 Normal caliber aorta. No dissection.  The main, lobar, and proximal segmental pulmonary arteries are  patent. The peripheral segmental and subsegmental branches are  poorly opacified.  Right chest wall Port-A-Cath, with tip projecting at the cavoatrial  junction. Heart size within normal limits. No pleural or  pericardial effusion.  There are mildly prominent mediastinal and hilar lymph nodes,  measuring up to 1.3 cm short axis subcarinal. Increased anterior  mediastinal soft tissue. Unchanged axillary lymph nodes.  Limited images through the upper abdomen show no acute finding.  Tracheostomy tube. Small amount mucus/debris at the level of the  carina. Mild bilateral areas  of ground-glass opacity and more  confluent nodular opacity within the left lung base. No  pneumothorax.  No acute osseous finding.  IMPRESSION:  No pulmonary arterial filling defect identified within the main,  lobar, or proximal segmental branches. The peripheral segmental  and subsegmental branches are nondiagnostic.  Bilateral patchy areas of ground-glass opacity may reflect  atelectasis or infection.  Clustered nodular left lung base opacity may reflect infection or  aspiration.  There are mildly prominent mediastinal and hilar lymph nodes,  measuring up to 1.3 cm short axis subcarinal. may be reactive  however metastatic disease not excluded. Recommend short-term  follow-up.  Increased anterior mediastinal soft tissue. May reflect rebound  thymic tissue. Attention at follow-up.  Original Report Authenticated By: Jearld Lesch, M.D.     ASSESSMENT AND PLAN:    1. History of larynx cancer: She is status post laryngectomy, status post adjuvant chemoradiation therapy. There is no evidence of disease recurrence or metastatic disease today on clinical history, physical exam, laboratory tests.  Since she is over 2 years out from the finish of therapy; there is no indication for routine surveillance neck CT.  2. Leukocytopenia and anemia secondary to most likely inflammation from recent chemoradiation therapy. There is no active bleeding. There is no need for transfusion. She was worked up in the past and was negative for myeloma.  3. History of smoking: She no longer smokes now.  4. Hypertension: on lisinopril, metoprolol per PCP. 5. 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 She is on levothyroxine. Recent dose adjustment in the hospital.  6. Seizure: none since stopping Effexor.  7. Lymphedema: Will refer to lymphedema clinic. 8. Enlarged mediastinal and hilar lymph nodes. Reactive versus metastatic disease. I have ordered a short term follow up CT of the chest in 3-4  months. 9. Follow up: In 3-4 months after CT scan. She will follow-up with ENT and Rad Onc as well 10. Age appropriate cancer screening:  She said that she is up to date with mammogram and Papsmear.  She would like to proceed with a screening colonoscopy and I have referred her for this.  Case reviewed with Dr Gaylyn Rong.

## 2012-08-26 ENCOUNTER — Ambulatory Visit: Payer: Medicaid Other | Attending: Oncology | Admitting: Physical Therapy

## 2012-08-26 DIAGNOSIS — M24519 Contracture, unspecified shoulder: Secondary | ICD-10-CM | POA: Insufficient documentation

## 2012-08-26 DIAGNOSIS — IMO0001 Reserved for inherently not codable concepts without codable children: Secondary | ICD-10-CM | POA: Insufficient documentation

## 2012-08-26 DIAGNOSIS — M25519 Pain in unspecified shoulder: Secondary | ICD-10-CM | POA: Insufficient documentation

## 2012-08-26 DIAGNOSIS — M542 Cervicalgia: Secondary | ICD-10-CM | POA: Insufficient documentation

## 2012-08-26 DIAGNOSIS — I890 Lymphedema, not elsewhere classified: Secondary | ICD-10-CM | POA: Insufficient documentation

## 2012-08-27 ENCOUNTER — Telehealth: Payer: Self-pay | Admitting: Family Medicine

## 2012-08-27 ENCOUNTER — Ambulatory Visit (INDEPENDENT_AMBULATORY_CARE_PROVIDER_SITE_OTHER): Payer: Medicaid Other | Admitting: Family Medicine

## 2012-08-27 ENCOUNTER — Ambulatory Visit (HOSPITAL_COMMUNITY)
Admission: RE | Admit: 2012-08-27 | Discharge: 2012-08-27 | Disposition: A | Discharge status: Home / Self Care | Payer: Medicaid Other | Source: Ambulatory Visit | Attending: Family Medicine | Admitting: Family Medicine

## 2012-08-27 ENCOUNTER — Encounter: Payer: Self-pay | Admitting: Family Medicine

## 2012-08-27 VITALS — BP 125/79 | HR 81 | Ht 67.0 in | Wt 177.0 lb

## 2012-08-27 DIAGNOSIS — M79609 Pain in unspecified limb: Secondary | ICD-10-CM

## 2012-08-27 DIAGNOSIS — M79661 Pain in right lower leg: Secondary | ICD-10-CM

## 2012-08-27 DIAGNOSIS — J189 Pneumonia, unspecified organism: Secondary | ICD-10-CM

## 2012-08-27 DIAGNOSIS — M79662 Pain in left lower leg: Secondary | ICD-10-CM

## 2012-08-27 DIAGNOSIS — M7989 Other specified soft tissue disorders: Secondary | ICD-10-CM

## 2012-08-27 DIAGNOSIS — I10 Essential (primary) hypertension: Secondary | ICD-10-CM

## 2012-08-27 LAB — BASIC METABOLIC PANEL
BUN: 12 mg/dL (ref 6–23)
Potassium: 4.1 mEq/L (ref 3.5–5.3)
Sodium: 139 mEq/L (ref 135–145)

## 2012-08-27 NOTE — Progress Notes (Signed)
Subjective:     Patient ID: Deborah [name], female   DOB: [birth year], 57 y.o.   MRN: [id number]  HPI  57 yo F presents for f/u appt to discuss the following:  1.CAP: completed doxycycline. Still having coughing fits sometimes. No CP, SOB or fever. Robitussin and tessalon perles do not help. Along with cough also has some sneezing and rhinorrhea. Does not have itching or watery eyes.   2. HTN: taking lisinopril 20 mg daily. Had headache 4 days ago. Denies current HA, CP, SOB and LE edema.   3. Bilateral calf pain: x 2 months. 4 days ago felt R posterior knee swelling. No injury. Also with pain in L medial knee also no injury. Denies redness or prolonged immobilization. Pain is worse when walking on flat feet and improved when walking in heels.   Review of Systems As per HPI     Objective:   Physical Exam BP 125/79  Pulse 81  Ht 5\' 7"  (1.702 m)  Wt 177 lb (80.287 kg)  BMI 27.72 kg/m2 General appearance: alert, cooperative and no distress Nose: no discharge, turbinates pink, swollen Lungs: clear to auscultation bilaterally Heart: regular rate and rhythm, S1, S2 normal, no murmur, click, rub or gallop Extremities: Homans sign is negative, no sign of DVT and no edema, redness or tenderness in the calves or thighs NO achilles tendon pain. Pain reproduced with dorsiflexion against resistance.   LE Doppler: Negative      Assessment:

## 2012-08-27 NOTE — Telephone Encounter (Signed)
Patient is calling after being seen today asking about a Rx for her allergies that she thought was going to be sent to [facility] [employer].

## 2012-08-27 NOTE — Progress Notes (Signed)
*  PRELIMINARY RESULTS* Vascular Ultrasound Lower extremity venous duplex has been completed.  Preliminary findings: Bilateral:  No evidence of DVT, superficial thrombosis, or Baker's Cyst.    Farrel Demark, RDMS, RVT  08/27/2012, 2:11 PM

## 2012-08-27 NOTE — Patient Instructions (Addendum)
Elmarie,  Thank you very much for coming in today.  Your BP is great! Continue lisinopril 20 mg daily.  For your calf pain:  I suspect muscle strain want to rule out blood clot (DVT) 1. Leg doppler 2. Do exercises I showed you in clinic set of 10, twice daily, work up to doing them on a step 3. Buy biofreeze- OTC rub on analgesic, use twice daily or ICE 15 minutes twice daily   Will call with results.   Dr. Armen Pickup

## 2012-08-27 NOTE — Telephone Encounter (Signed)
Will forward to Dr. Funches 

## 2012-08-28 MED ORDER — LORATADINE 10 MG PO TABS: 10.0000 mg | ORAL_TABLET | Freq: Every day | ORAL | Status: DC

## 2012-08-28 NOTE — Assessment & Plan Note (Signed)
A: completed antibiotic course. Persistent cough. No fever or chills to suggest ongoing PNA.  P: D/C mucinex and tessalon Send loratadine for cough.

## 2012-08-28 NOTE — Assessment & Plan Note (Signed)
A: improved with increased lisinopril dose.  P: Continue lisinopril 20 mg as tolerated, monitor cough as it may be worse due to ACE inhibitor Check BMP today Cr and K.

## 2012-08-28 NOTE — Assessment & Plan Note (Signed)
A: calf pain w/o injury/excessive exercise. LE doppler obtained due to history of malignancy and negative. P: Treat as muscle strain Ice, strengthening/strethcing exercises exhibited in office/advised OTC analgesic rub.

## 2012-08-28 NOTE — Telephone Encounter (Signed)
claritin sent in.  Please inform pt.

## 2012-09-01 ENCOUNTER — Telehealth: Payer: Self-pay | Admitting: Oncology

## 2012-09-01 ENCOUNTER — Telehealth: Payer: Self-pay | Admitting: Family Medicine

## 2012-09-01 NOTE — Telephone Encounter (Signed)
lmonvm adviisng the pt of her June 2014 appts for the lab and the ct scan along with the md appt.

## 2012-09-01 NOTE — Telephone Encounter (Signed)
LMOM advising pt med sent to [facility]

## 2012-09-01 NOTE — Telephone Encounter (Signed)
Called patient. Left VM.  BMP normal.

## 2012-09-02 ENCOUNTER — Telehealth: Payer: Self-pay | Admitting: Oncology

## 2012-09-02 ENCOUNTER — Telehealth: Payer: Self-pay | Admitting: Family Medicine

## 2012-09-02 DIAGNOSIS — Z1211 Encounter for screening for malignant neoplasm of colon: Secondary | ICD-10-CM

## 2012-09-02 NOTE — Telephone Encounter (Signed)
lmonvm advising the pt that dr ha has referred her to see a gast  md for a screening colonoscopy. Advised the pt that she will need to contact her pcp for them to set up that appt for you do to the nature of your insurance being [employer] access. Asked the pt to call back with any questions.

## 2012-09-02 NOTE — Telephone Encounter (Signed)
GI referral placed for screening colonoscopy. ?

## 2012-09-02 NOTE — Telephone Encounter (Signed)
Pt needs for [place] to make referral for her colonoscopy  Because of her insurance - pls advise

## 2012-09-03 NOTE — Telephone Encounter (Signed)
Nurse visiit at [facility] is Tues, Apr 8 at 10AM, colonoscopy is sched for Tues, Apr 22 at 830. LMOM per pt request [id number] and #to call if need to RS.

## 2012-09-03 NOTE — Telephone Encounter (Signed)
To [facility], she is waiting to hear back from Dr. Has office. Kashawna Manzer, Maryjo Rochester

## 2012-09-13 ENCOUNTER — Other Ambulatory Visit: Payer: Self-pay | Admitting: Family Medicine

## 2012-09-13 MED ORDER — ONDANSETRON 4 MG PO TBDP: 4.0000 mg | ORAL_TABLET | Freq: Three times a day (TID) | ORAL | Status: DC | PRN

## 2012-09-21 ENCOUNTER — Encounter: Payer: Self-pay | Admitting: *Deleted

## 2012-09-21 NOTE — Progress Notes (Signed)
Patient ID: Deborah [name], female   DOB: [birth year], 57 y.o.   MRN: [id number] Pt had scheduled appt for screening colonoscopy with Dr. Juanda Chance.  She has hx of throat cancer with laryngectomy and currently has stoma. Per anesthesia guidelines: pt with head of neck deformity will need to be evaluated by MD; cannot have procedure at [facility]; will need to be scheduled at hospital.  New pt appt scheduled with Dr. Juanda Chance 5/27 at 9:45.  Colonoscopy at [facility] cancelled.

## 2012-09-22 ENCOUNTER — Other Ambulatory Visit: Payer: Self-pay | Admitting: Family Medicine

## 2012-09-22 ENCOUNTER — Encounter: Payer: Self-pay | Admitting: *Deleted

## 2012-09-23 MED ORDER — LEVOTHYROXINE SODIUM 75 MCG PO TABS: 75.0000 ug | ORAL_TABLET | Freq: Every day | ORAL | Status: DC

## 2012-09-24 ENCOUNTER — Telehealth: Payer: Self-pay | Admitting: Family Medicine

## 2012-09-24 DIAGNOSIS — E039 Hypothyroidism, unspecified: Secondary | ICD-10-CM

## 2012-09-24 NOTE — Telephone Encounter (Signed)
Called patient. Left VM. Pharmacy changing levothyroxine manufacture from [employer] to [employer]. I ok'd the change.  Will need patient to come in for TSH check 8-12 weeks following change. Lab ordered.

## 2012-09-27 ENCOUNTER — Ambulatory Visit: Payer: Medicaid Other | Attending: Oncology

## 2012-09-27 DIAGNOSIS — M25519 Pain in unspecified shoulder: Secondary | ICD-10-CM | POA: Insufficient documentation

## 2012-09-27 DIAGNOSIS — IMO0001 Reserved for inherently not codable concepts without codable children: Secondary | ICD-10-CM | POA: Insufficient documentation

## 2012-09-27 DIAGNOSIS — M542 Cervicalgia: Secondary | ICD-10-CM | POA: Insufficient documentation

## 2012-09-27 DIAGNOSIS — M24519 Contracture, unspecified shoulder: Secondary | ICD-10-CM | POA: Insufficient documentation

## 2012-09-27 DIAGNOSIS — I890 Lymphedema, not elsewhere classified: Secondary | ICD-10-CM | POA: Insufficient documentation

## 2012-09-29 ENCOUNTER — Other Ambulatory Visit: Payer: Medicaid Other

## 2012-09-29 ENCOUNTER — Ambulatory Visit: Payer: Medicaid Other | Admitting: Physical Therapy

## 2012-09-29 DIAGNOSIS — E039 Hypothyroidism, unspecified: Secondary | ICD-10-CM

## 2012-09-29 LAB — TSH: TSH: 11.669 u[IU]/mL — ABNORMAL HIGH (ref 0.350–4.500)

## 2012-09-29 NOTE — Progress Notes (Signed)
TSH DONE TODAY Deborah [name] 

## 2012-10-04 ENCOUNTER — Ambulatory Visit: Payer: Medicaid Other

## 2012-10-05 ENCOUNTER — Encounter: Payer: Self-pay | Admitting: Internal Medicine

## 2012-10-06 ENCOUNTER — Ambulatory Visit: Payer: Medicaid Other | Admitting: Physical Therapy

## 2012-10-11 ENCOUNTER — Ambulatory Visit: Payer: Medicaid Other

## 2012-10-11 ENCOUNTER — Telehealth: Payer: Self-pay | Admitting: Family Medicine

## 2012-10-11 DIAGNOSIS — E039 Hypothyroidism, unspecified: Secondary | ICD-10-CM

## 2012-10-11 MED ORDER — LEVOTHYROXINE SODIUM 100 MCG PO TABS: 100.0000 ug | ORAL_TABLET | Freq: Every day | ORAL | Status: DC

## 2012-10-11 NOTE — Telephone Encounter (Signed)
Called patient. Left VM. TSH elevated.  Will increase synthroid from 75 mcg to 100 mcg daily. Repeat TSH in 8 weeks, lab ordered.

## 2012-10-20 ENCOUNTER — Encounter: Payer: Self-pay | Admitting: Physical Therapy

## 2012-10-21 ENCOUNTER — Other Ambulatory Visit: Payer: Self-pay | Admitting: *Deleted

## 2012-10-21 DIAGNOSIS — IMO0002 Reserved for concepts with insufficient information to code with codable children: Secondary | ICD-10-CM

## 2012-10-21 MED ORDER — ALBUTEROL SULFATE HFA 108 (90 BASE) MCG/ACT IN AERS: 1.0000 | INHALATION_SPRAY | RESPIRATORY_TRACT | Status: DC | PRN

## 2012-11-09 ENCOUNTER — Ambulatory Visit (INDEPENDENT_AMBULATORY_CARE_PROVIDER_SITE_OTHER): Payer: Medicaid Other | Admitting: Internal Medicine

## 2012-11-09 ENCOUNTER — Encounter: Payer: Self-pay | Admitting: Internal Medicine

## 2012-11-09 VITALS — BP 90/60 | HR 68 | Ht 67.0 in | Wt 184.0 lb

## 2012-11-09 DIAGNOSIS — Z9889 Other specified postprocedural states: Secondary | ICD-10-CM

## 2012-11-09 DIAGNOSIS — Z9002 Acquired absence of larynx: Secondary | ICD-10-CM

## 2012-11-09 DIAGNOSIS — Z1211 Encounter for screening for malignant neoplasm of colon: Secondary | ICD-10-CM

## 2012-11-09 MED ORDER — MOVIPREP 100 G PO SOLR: 1.0000 | Freq: Once | ORAL | Status: DC

## 2012-11-09 NOTE — Progress Notes (Signed)
Deborah [name] 11/29/1955 MRN [id number]        History of Present Illness:  This is a  56-year-old, African American female here to discuss screening colonoscopy. 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 She is status post total laryngectomy and bilateral neck resection in February 2012.She had chemotherapy and radiation which was concluded in May 2012. Percutaneous gastrostomy was placed in June 2012 by Dr. Hung and later on was removed. She denies dysphagia, odynophagia or any difficulty with swallowing. There is no family history of colon cancer. Her bowel habits are regular. She denies rectal bleeding or abdominal pain   Past Medical History  Diagnosis Date  . Hypertension   . Tracheostomy dependent   . Gastrostomy feeding   . History of laryngectomy   . Bronchitis   . 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  . Heart murmur     asymptomatic   . Leukocytopenia   . Asthma   . Sinusitis, chronic 07/20/2011  . Normal MRI 07/14/11    negative for mestasis   . Hx of radiation therapy 09/03/10 to 10/16/2010    supraglottic larynx  . Seizures     07/24/11 off Effexor w/o seizure  . 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 Neck pain 01/21/2012  . Anemia   . GERD (gastroesophageal reflux disease)   . Sepsis 08/04/12   Past Surgical History  Procedure Laterality Date  . Portacath placement  09/17/10    Tip in cavoatrial junction  . Laryngectomy    . Tracheal dilitation  07/16/2011    Procedure: TRACHEAL DILITATION;  Surgeon: Jefry H Rosen, MD;  Location: [facility] OR;  Service: ENT;  Laterality: N/A;  dilation of tracheal stoma and replacement of stoma tube  . Foreign body removal bronchial  10/02/2011    Procedure: REMOVAL FOREIGN BODY BRONCHIAL;  Surgeon: Mitchell Gore, MD;  Location: [facility] OR;  Service: ENT;  Laterality: N/A;  . Esophagoscopy  06/21/2012    Procedure: ESOPHAGOSCOPY;   Surgeon: Jefry Rosen, MD;  Location: [facility];  Service: ENT;  Laterality: N/A;    reports that she quit smoking about 2 years ago. Her smoking use included Cigarettes. She smoked 0.00 packs per day. She does not have any smokeless tobacco history on file. She reports that she does not drink alcohol or use illicit drugs. family history includes Heart disease in her father, mother, and sister. No Known Allergies      Review of Systems:  The remainder of the 10 point ROS is negative except as outlined in H&P   Physical Exam: General appearance  Well developed, in no distress.. Psychological normal mood and affect.  Assessment and Plan:  56-year-old African American female who is appropriate  candidate for screening colonoscopy. She has a history of squamous cell carcinoma currently in remission. She had laryngectomy but there is no problem with her taking a prep for colonoscopy. She is interested in scheduling colonoscopy using move prep. We have discussed sedation, the prep and the procedure.   11/09/2012 Deborah [name]  

## 2012-11-09 NOTE — Patient Instructions (Addendum)
You have been scheduled for a colonoscopy with propofol. Please follow written instructions given to you at your visit today.  Please pick up your prep kit at the pharmacy within the next 1-3 days. If you use inhalers (even only as needed), please bring them with you on the day of your procedure. Your physician has requested that you go to www.startemmi.com and enter the access code given to you at your visit today. This web site gives a general overview about your procedure. However, you should still follow specific instructions given to you by our office regarding your preparation for the procedure. CC:  [name] MD, Clenton Pare NP

## 2012-11-10 ENCOUNTER — Telehealth: Payer: Self-pay | Admitting: *Deleted

## 2012-11-10 ENCOUNTER — Encounter (HOSPITAL_COMMUNITY): Payer: Self-pay | Admitting: Dentistry

## 2012-11-10 ENCOUNTER — Ambulatory Visit (HOSPITAL_COMMUNITY): Payer: Self-pay | Admitting: Dentistry

## 2012-11-10 VITALS — BP 133/81 | HR 96 | Temp 98.9°F

## 2012-11-10 DIAGNOSIS — K117 Disturbances of salivary secretion: Secondary | ICD-10-CM

## 2012-11-10 DIAGNOSIS — K08109 Complete loss of teeth, unspecified cause, unspecified class: Secondary | ICD-10-CM

## 2012-11-10 DIAGNOSIS — Z972 Presence of dental prosthetic device (complete) (partial): Secondary | ICD-10-CM

## 2012-11-10 DIAGNOSIS — K1370 Unspecified lesions of oral mucosa: Secondary | ICD-10-CM

## 2012-11-10 DIAGNOSIS — Z463 Encounter for fitting and adjustment of dental prosthetic device: Secondary | ICD-10-CM

## 2012-11-10 DIAGNOSIS — K062 Gingival and edentulous alveolar ridge lesions associated with trauma: Secondary | ICD-10-CM

## 2012-11-10 NOTE — Telephone Encounter (Signed)
Spoke with patient and gave her appointment date and time for colonoscopy at [facility] endo. Gave her new times for prep( Start on Sunday 5 PM and second dose at 5:45 AM on Monday. NPO 4 hours prior)

## 2012-11-10 NOTE — Telephone Encounter (Signed)
Per Dr. Juanda Chance, Patient will need prop colonoscopy at [facility] endo. Spoke with Noreene Larsson at [facility] hospital and scheduled patient on 11/16/12 at 10:45 AM. She will let anesthesia know patient has a trach. Left a message for patient to call me.

## 2012-11-10 NOTE — Telephone Encounter (Signed)
Left a message for patient to call me re: procedure.

## 2012-11-10 NOTE — Patient Instructions (Signed)
Patient to keep dentures out if denture irritation arises. Patient to use salt water rinses as needed to aid healing. Patient to call for denture adjustment as needed. Will obtain prior approval for upper and lower complete dentures. I will then schedule patient for the procedure in approximately one month.  Dr. Kristin Bruins

## 2012-11-10 NOTE — Progress Notes (Signed)
11/10/2012  Patient:            Deborah [name] Date of Birth:  April 30, 1956 MRN:                [id number]  BP 133/81  Pulse 96  Temp(Src) 98.9 F (37.2 C) (Oral)  Deborah [name] is a 57 year old female that presents for periodic oral exam and evaluation of complete dentures. Premedication: None required. Medical Hx Update:  Past Medical History  Diagnosis Date  . Hypertension   . Tracheostomy dependent   . Gastrostomy feeding   . History of laryngectomy   . Bronchitis   . 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  [name] Heart murmur     asymptomatic   . Leukocytopenia   . Asthma   . Sinusitis, chronic 07/20/2011  . Normal MRI 07/14/11    negative for mestasis   . Hx of radiation therapy 09/03/10 to 10/16/2010    supraglottic larynx  . Seizures     07/24/11 off Effexor w/o seizure  . 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 Neck pain 01/21/2012  . Anemia   . GERD (gastroesophageal reflux disease)   . Sepsis 08/04/12  . ALLERGIES/ADVERSE DRUG REACTIONS: No Known Allergies MEDICATIONS: Current Outpatient Prescriptions  Medication Sig Dispense Refill  . albuterol (PROVENTIL HFA;VENTOLIN HFA) 108 (90 BASE) MCG/ACT inhaler Inhale 1-2 puffs into the lungs every 4 (four) hours as needed for wheezing or shortness of breath.  1 Inhaler  0  . buPROPion (WELLBUTRIN XL) 150 MG 24 hr tablet TAKE ONE TABLET BY MOUTH EVERY DAY  90 tablet  0  . cyclobenzaprine (FLEXERIL) 10 MG tablet Take 1 tablet (10 mg total) by mouth 2 (two) times daily as needed for muscle spasms.  30 tablet  2  . levothyroxine (SYNTHROID, LEVOTHROID) 100 MCG tablet Take 1 tablet (100 mcg total) by mouth daily before breakfast.  30 tablet  3  . lisinopril (PRINIVIL,ZESTRIL) 20 MG tablet Take 1 tablet (20 mg total) by mouth daily.  90 tablet  1  . loratadine (CLARITIN) 10 MG tablet Take 1 tablet (10 mg total) by mouth daily.  30 tablet  11  . metoprolol tartrate (LOPRESSOR) 25 MG  tablet Take 1 tablet (25 mg total) by mouth 2 (two) times daily.  180 tablet  1  . MOVIPREP 100 G SOLR Take 1 kit (100 g total) by mouth once.  1 kit  0  . omeprazole (PRILOSEC) 40 MG capsule Take 1 capsule (40 mg total) by mouth daily.  30 capsule  3  . ondansetron (ZOFRAN ODT) 4 MG disintegrating tablet Take 1 tablet (4 mg total) by mouth every 8 (eight) hours as needed for nausea.  20 tablet  0  . promethazine (PHENERGAN) 25 MG tablet Take 1 tablet (25 mg total) by mouth every 8 (eight) hours as needed for nausea.  20 tablet  0   No current facility-administered medications for this visit.    C/C: Patient presents for periodic oral examination and evaluation of complete dentures   HPI:   Patient had upper and  lower complete dentures inserted on 02/27/2011. Patient now presents for periodic oral examination and evaluation of the upper and lower complete dentures. Patient is having some discomfort involving the maxillary right anterior ridge.    DENTAL EXAM: General: Patient is a well-developed, well-nourished female in no acute distress. Vitals: Blood pressure 133/81, pulse 96, temperature 98.9 F (  37.2 C), temperature source Oral.  Extraoral Exam:  There is no palpable lymphadenopathy. There are no TMJ Symptoms. The stoma is still in place. Intraoral  Exam: Patient has xerostomia. There are no soft tissue lesions or denture irritation noted. Generalized erythema is noted secondary to dryness. Patient denies any recent smoking. Dentition: Patient is edentulous. Prosthodontic: The patient has upper and lower complete dentures. Dentures are stable with less than ideal retention. Will consider lab relines at this time. Pressure indicating paste was applied and dentures were adjusted as needed. Significant adjustments made at this time to upper and lower dentures. Dentures were polished.  Occlusion:  The denture occlusion was evaluated. Patient is protruding lower jaw in to an end to end  position.  This is the primary reason the patient has maxillary anterior denture irritation. Patient was instructed on finding maximum intercuspation position and the centric relation position. Adjustments were made in maximum intercuspation position. [place].  Assessments: 1. Patient is edentulous. 2. Denture irritation 3. The dentures are stable but now have less than ideal retention.  Patient does wish to proceed with a upper and lower complete denture relines. I will obtain her approval from [facility] before scheduling the patient.   Plan:  1. Patient to followup with dental medicine as scheduled for yearly recall .  I will obtain prior approval for the denture relines and then schedule patient for the procedure.  Patient to call if denture adjustment as needed before then.   Charlynne Pander, DDS

## 2012-11-11 ENCOUNTER — Other Ambulatory Visit (HOSPITAL_COMMUNITY): Payer: Self-pay | Admitting: Internal Medicine

## 2012-11-11 ENCOUNTER — Encounter (HOSPITAL_COMMUNITY): Payer: Self-pay | Admitting: *Deleted

## 2012-11-11 ENCOUNTER — Encounter (HOSPITAL_COMMUNITY): Payer: Self-pay | Admitting: Pharmacy Technician

## 2012-11-11 ENCOUNTER — Encounter (HOSPITAL_COMMUNITY)
Admission: RE | Admit: 2012-11-11 | Discharge: 2012-11-11 | Disposition: A | Discharge status: Home / Self Care | Payer: Medicaid Other | Source: Ambulatory Visit | Attending: Internal Medicine | Admitting: Internal Medicine

## 2012-11-11 LAB — BASIC METABOLIC PANEL
Calcium: 8.8 mg/dL (ref 8.4–10.5)
Chloride: 105 mEq/L (ref 96–112)
Creatinine, Ser: 1.05 mg/dL (ref 0.50–1.10)
GFR calc Af Amer: 68 mL/min — ABNORMAL LOW (ref >90)

## 2012-11-11 LAB — CBC
HCT: 32.3 % — ABNORMAL LOW (ref 36.0–46.0)
Platelets: 368 10*3/uL (ref 150–400)
RDW: 13.5 % (ref 11.5–15.5)
WBC: 8.5 10*3/uL (ref 4.0–10.5)

## 2012-11-11 NOTE — Progress Notes (Signed)
Spoke with dr Leta Jungling and reviewed pt medical history, anesthesia will see pt am of procedure.

## 2012-11-14 NOTE — Interval H&P Note (Signed)
History and Physical Interval Note:  11/14/2012 1:46 PM  Deborah [name]  has presented today for surgery, with the diagnosis of screening  The various methods of treatment have been discussed with the patient and family. After consideration of risks, benefits and other options for treatment, the patient has consented to  Procedure(s): COLONOSCOPY (N/A) as a surgical intervention .  The patient's history has been reviewed, patient examined, no change in status, stable for surgery.  I have reviewed the patient's chart and labs.  Questions were answered to the patient's satisfaction.     [name]

## 2012-11-14 NOTE — H&P (View-Only) (Signed)
Deborah [name] Oct 20, 1955 MRN [id number]        History of Present Illness:  This is a  57 year old, African American female here to discuss screening colonoscopy. 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 She is status post total laryngectomy and bilateral neck resection in February 2012.She had chemotherapy and radiation which was concluded in May 2012. Percutaneous gastrostomy was placed in June 2012 by Dr. Elnoria Howard and later on was removed. She denies dysphagia, odynophagia or any difficulty with swallowing. There is no family history of colon cancer. Her bowel habits are regular. She denies rectal bleeding or abdominal pain   Past Medical History  Diagnosis Date  . Hypertension   . Tracheostomy dependent   . Gastrostomy feeding   . History of laryngectomy   . Bronchitis   . 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  Deborah [name] Heart murmur     asymptomatic   . Leukocytopenia   . Asthma   . Sinusitis, chronic 07/20/2011  . Normal MRI 07/14/11    negative for mestasis   . Hx of radiation therapy 09/03/10 to 10/16/2010    supraglottic larynx  . Seizures     07/24/11 off Effexor w/o seizure  . 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 Neck pain 01/21/2012  . Anemia   . GERD (gastroesophageal reflux disease)   . Sepsis 08/04/12   Past Surgical History  Procedure Laterality Date  . Portacath placement  09/17/10    Tip in cavoatrial junction  . Laryngectomy    . Tracheal dilitation  07/16/2011    Procedure: TRACHEAL DILITATION;  Surgeon: [name], MD;  Location: [facility] OR;  Service: ENT;  Laterality: N/A;  dilation of tracheal stoma and replacement of stoma tube  . Foreign body removal bronchial  10/02/2011    Procedure: REMOVAL FOREIGN BODY BRONCHIAL;  Surgeon: Melvenia Beam, MD;  Location: [facility];  Service: ENT;  Laterality: N/A;  . Esophagoscopy  06/21/2012    Procedure: ESOPHAGOSCOPY;   Surgeon: Serena Colonel, MD;  Location: [facility];  Service: ENT;  Laterality: N/A;    reports that she quit smoking about 2 years ago. Her smoking use included Cigarettes. She smoked 0.00 packs per day. She does not have any smokeless tobacco history on file. She reports that she does not drink alcohol or use illicit drugs. family history includes Heart disease in her father, mother, and sister. No Known Allergies      Review of Systems:  The remainder of the 10 point ROS is negative except as outlined in H&P   Physical Exam: General appearance  Well developed, in no distress.Deborah [name] Psychological normal mood and affect.  Assessment and Plan:  56 year old [place] American female who is appropriate  candidate for screening colonoscopy. She has a history of squamous cell carcinoma currently in remission. She had laryngectomy but there is no problem with her taking a prep for colonoscopy. She is interested in scheduling colonoscopy using move prep. We have discussed sedation, the prep and the procedure.   11/09/2012 Deborah [name]

## 2012-11-15 ENCOUNTER — Encounter (HOSPITAL_COMMUNITY): Payer: Self-pay | Admitting: Anesthesiology

## 2012-11-15 ENCOUNTER — Encounter (HOSPITAL_COMMUNITY): Payer: Self-pay | Admitting: *Deleted

## 2012-11-15 ENCOUNTER — Ambulatory Visit (HOSPITAL_COMMUNITY): Payer: Medicaid Other | Admitting: Anesthesiology

## 2012-11-15 ENCOUNTER — Ambulatory Visit (HOSPITAL_COMMUNITY): Admission: RE | Admit: 2012-11-15 | Payer: Medicaid Other | Source: Ambulatory Visit | Admitting: Internal Medicine

## 2012-11-15 ENCOUNTER — Ambulatory Visit (HOSPITAL_COMMUNITY)
Admission: RE | Admit: 2012-11-15 | Discharge: 2012-11-15 | Disposition: A | Discharge status: Home / Self Care | Payer: Medicaid Other | Source: Ambulatory Visit | Attending: Internal Medicine | Admitting: Internal Medicine

## 2012-11-15 ENCOUNTER — Encounter (HOSPITAL_COMMUNITY)
Admission: RE | Disposition: A | Discharge status: Home / Self Care | Payer: Self-pay | Source: Ambulatory Visit | Attending: Internal Medicine

## 2012-11-15 ENCOUNTER — Encounter (HOSPITAL_COMMUNITY): Admission: RE | Payer: Self-pay | Source: Ambulatory Visit

## 2012-11-15 DIAGNOSIS — Z9221 Personal history of antineoplastic chemotherapy: Secondary | ICD-10-CM | POA: Insufficient documentation

## 2012-11-15 DIAGNOSIS — J329 Chronic sinusitis, unspecified: Secondary | ICD-10-CM | POA: Insufficient documentation

## 2012-11-15 DIAGNOSIS — Z8521 Personal history of malignant neoplasm of larynx: Secondary | ICD-10-CM | POA: Insufficient documentation

## 2012-11-15 DIAGNOSIS — Z87891 Personal history of nicotine dependence: Secondary | ICD-10-CM | POA: Insufficient documentation

## 2012-11-15 DIAGNOSIS — K5790 Diverticulosis of intestine, part unspecified, without perforation or abscess without bleeding: Secondary | ICD-10-CM

## 2012-11-15 DIAGNOSIS — Z1211 Encounter for screening for malignant neoplasm of colon: Secondary | ICD-10-CM

## 2012-11-15 DIAGNOSIS — D649 Anemia, unspecified: Secondary | ICD-10-CM | POA: Insufficient documentation

## 2012-11-15 DIAGNOSIS — K219 Gastro-esophageal reflux disease without esophagitis: Secondary | ICD-10-CM | POA: Insufficient documentation

## 2012-11-15 DIAGNOSIS — R011 Cardiac murmur, unspecified: Secondary | ICD-10-CM | POA: Insufficient documentation

## 2012-11-15 DIAGNOSIS — K648 Other hemorrhoids: Secondary | ICD-10-CM

## 2012-11-15 DIAGNOSIS — Z923 Personal history of irradiation: Secondary | ICD-10-CM | POA: Insufficient documentation

## 2012-11-15 DIAGNOSIS — Z9089 Acquired absence of other organs: Secondary | ICD-10-CM | POA: Insufficient documentation

## 2012-11-15 DIAGNOSIS — J4489 Other specified chronic obstructive pulmonary disease: Secondary | ICD-10-CM | POA: Insufficient documentation

## 2012-11-15 DIAGNOSIS — J449 Chronic obstructive pulmonary disease, unspecified: Secondary | ICD-10-CM | POA: Insufficient documentation

## 2012-11-15 DIAGNOSIS — Z930 Tracheostomy status: Secondary | ICD-10-CM | POA: Insufficient documentation

## 2012-11-15 DIAGNOSIS — I10 Essential (primary) hypertension: Secondary | ICD-10-CM | POA: Insufficient documentation

## 2012-11-15 DIAGNOSIS — E039 Hypothyroidism, unspecified: Secondary | ICD-10-CM | POA: Insufficient documentation

## 2012-11-15 HISTORY — DX: Diverticulosis of intestine, part unspecified, without perforation or abscess without bleeding: K57.90

## 2012-11-15 HISTORY — DX: Other hemorrhoids: K64.8

## 2012-11-15 HISTORY — PX: COLONOSCOPY: SHX5424

## 2012-11-15 SURGERY — COLONOSCOPY
Anesthesia: Monitor Anesthesia Care

## 2012-11-15 MED ORDER — PROPOFOL INFUSION 10 MG/ML OPTIME
INTRAVENOUS | Status: DC | PRN
  Administered 2012-11-15: 300 ug/kg/min via INTRAVENOUS

## 2012-11-15 MED ORDER — LACTATED RINGERS IV SOLN
INTRAVENOUS | Status: DC | PRN
  Administered 2012-11-15: 09:00:00 via INTRAVENOUS

## 2012-11-15 MED ORDER — SODIUM CHLORIDE 0.9 % IV SOLN: INTRAVENOUS | Status: DC

## 2012-11-15 NOTE — Anesthesia Postprocedure Evaluation (Signed)
Anesthesia Post Note  Patient: Deborah [name]  Procedure(s) Performed: Procedure(s) (LRB): COLONOSCOPY (N/A)  Anesthesia type: MAC  Patient location: PACU  Post pain: Pain level controlled  Post assessment: Post-op Vital signs reviewed  Last Vitals:  Filed Vitals:   11/15/12 1157  BP: 161/94  Pulse:   Temp:   Resp: 17    Post vital signs: Reviewed  Level of consciousness: sedated  Complications: No apparent anesthesia complications

## 2012-11-15 NOTE — Op Note (Signed)
[facility] [address] [place], [address]   COLONOSCOPY PROCEDURE REPORT  PATIENT: Deborah [name], Deborah [name]  MR#: [id number] BIRTHDATE: 1955-07-15 , 56  yrs. old GENDER: Female ENDOSCOPIST: Hart Carwin, MD REFERRED BY: Dr [name], Christin Bach NP PROCEDURE DATE:  11/15/2012 PROCEDURE:   Colonoscopy, screening ASA CLASS:   Class III INDICATIONS:Average risk patient for colon cancer. MEDICATIONS: MAC sedation, administered by CRNA  DESCRIPTION OF PROCEDURE:   After the risks and benefits and of the procedure were explained, informed consent was obtained.  A digital rectal exam revealed no abnormalities of the rectum.    The Pentax Ped Colon [id number]  endoscope was introduced through the anus and advanced to the cecum, which was identified by both the appendix and ileocecal valve .  The quality of the prep was excellent, using MoviPrep .  The instrument was then slowly withdrawn as the colon was fully examined.     COLON FINDINGS: Mild diverticulosis was noted throughout the entire examined colon.   Small internal hemorrhoids were found. Retroflexed views revealed no abnormalities.     The scope was then withdrawn from the patient and the procedure completed.  COMPLICATIONS: There were no complications. ENDOSCOPIC IMPRESSION: Mild diverticulosis was noted throughout the entire examined colon Small internal hemorrhoids  RECOMMENDATIONS: High fiber diet   REPEAT EXAM: In 10 year(s)  for Colonoscopy.  cc:  _______________________________ eSignedHart Carwin, MD 11/15/2012 11:42 AM

## 2012-11-15 NOTE — Anesthesia Preprocedure Evaluation (Signed)
Anesthesia Evaluation  Patient identified by MRN, date of birth, ID band Patient awake    Reviewed: Allergy & Precautions, H&P , NPO status , Patient's Chart, lab work & pertinent test results, reviewed documented beta blocker date and time   Airway Mallampati: II TM Distance: >3 FB Neck ROM: Full    Dental no notable dental hx. (+) Dental Advisory Given   Pulmonary shortness of breath, asthma , pneumonia -, former smoker,  Hx of laryngeal CA; prior laryngectomty breath sounds clear to auscultation  Pulmonary exam normal       Cardiovascular hypertension, Pt. on home beta blockers and Pt. on medications Rhythm:Regular Rate:Normal     Neuro/Psych Seizures -, Well Controlled,  negative psych ROS   GI/Hepatic negative GI ROS, Neg liver ROS, GERD-  ,  Endo/Other  Hypothyroidism   Renal/GU negative Renal ROS  negative genitourinary   Musculoskeletal   Abdominal   Peds  Hematology negative hematology ROS (+)   Anesthesia Other Findings   Reproductive/Obstetrics negative OB ROS                           Anesthesia Physical Anesthesia Plan  ASA: III  Anesthesia Plan: MAC   Post-op Pain Management:    Induction: Intravenous  Airway Management Planned: Simple Face Mask  Additional Equipment:   Intra-op Plan:   Post-operative Plan:   Informed Consent: I have reviewed the patients History and Physical, chart, labs and discussed the procedure including the risks, benefits and alternatives for the proposed anesthesia with the patient or authorized representative who has indicated his/her understanding and acceptance.   Dental advisory given  Plan Discussed with: CRNA  Anesthesia Plan Comments:         Anesthesia Quick Evaluation

## 2012-11-15 NOTE — Transfer of Care (Signed)
Immediate Anesthesia Transfer of Care Note  Patient: Deborah [name]  Procedure(s) Performed: Procedure(s): COLONOSCOPY (N/A)  Patient Location: PACU and Endoscopy Unit  Anesthesia Type:MAC  Level of Consciousness: awake, sedated and patient cooperative  Airway & Oxygen Therapy: Patient Spontanous Breathing and Patient connected to tracheostomy mask oxygen  Post-op Assessment: Report given to PACU RN and Post -op Vital signs reviewed and stable  Post vital signs: Reviewed and stable  Complications: No apparent anesthesia complications

## 2012-11-15 NOTE — Preoperative (Signed)
Beta Blockers   Reason not to administer Beta Blockers:Not Applicable 

## 2012-11-15 NOTE — Interval H&P Note (Signed)
History and Physical Interval Note:  11/15/2012 10:29 AM  Deborah [name]  has presented today for surgery, with the diagnosis of screening  The various methods of treatment have been discussed with the patient and family. After consideration of risks, benefits and other options for treatment, the patient has consented to  Procedure(s): COLONOSCOPY (N/A) as a surgical intervention .  The patient's history has been reviewed, patient examined, no change in status, stable for surgery.  I have reviewed the patient's chart and labs.  Questions were answered to the patient's satisfaction.     [name]

## 2012-11-16 ENCOUNTER — Encounter: Payer: Self-pay | Admitting: Internal Medicine

## 2012-11-17 ENCOUNTER — Encounter (HOSPITAL_COMMUNITY): Payer: Self-pay | Admitting: Internal Medicine

## 2012-11-23 ENCOUNTER — Other Ambulatory Visit (HOSPITAL_BASED_OUTPATIENT_CLINIC_OR_DEPARTMENT_OTHER): Payer: Medicaid Other | Admitting: Lab

## 2012-11-23 ENCOUNTER — Ambulatory Visit (HOSPITAL_COMMUNITY)
Admission: RE | Admit: 2012-11-23 | Discharge: 2012-11-23 | Disposition: A | Discharge status: Home / Self Care | Payer: Medicaid Other | Source: Ambulatory Visit | Attending: Oncology | Admitting: Oncology

## 2012-11-23 DIAGNOSIS — C329 Malignant neoplasm of larynx, unspecified: Secondary | ICD-10-CM | POA: Insufficient documentation

## 2012-11-23 DIAGNOSIS — R599 Enlarged lymph nodes, unspecified: Secondary | ICD-10-CM | POA: Insufficient documentation

## 2012-11-23 DIAGNOSIS — Z930 Tracheostomy status: Secondary | ICD-10-CM | POA: Insufficient documentation

## 2012-11-23 DIAGNOSIS — C321 Malignant neoplasm of supraglottis: Secondary | ICD-10-CM

## 2012-11-23 DIAGNOSIS — Z9221 Personal history of antineoplastic chemotherapy: Secondary | ICD-10-CM | POA: Insufficient documentation

## 2012-11-23 LAB — CBC WITH DIFFERENTIAL/PLATELET
Basophils Absolute: 0.1 10*3/uL (ref 0.0–0.1)
Eosinophils Absolute: 0.7 10*3/uL — ABNORMAL HIGH (ref 0.0–0.5)
HGB: 11.4 g/dL — ABNORMAL LOW (ref 11.6–15.9)
MONO#: 0.3 10*3/uL (ref 0.1–0.9)
NEUT#: 3.5 10*3/uL (ref 1.5–6.5)
RDW: 13.7 % (ref 11.2–14.5)
lymph#: 1.2 10*3/uL (ref 0.9–3.3)

## 2012-11-23 LAB — COMPREHENSIVE METABOLIC PANEL (CC13)
Albumin: 3.5 g/dL (ref 3.5–5.0)
BUN: 10.1 mg/dL (ref 7.0–26.0)
Calcium: 9.2 mg/dL (ref 8.4–10.4)
Chloride: 105 mEq/L (ref 98–107)
Glucose: 83 mg/dl (ref 70–99)
Potassium: 3.2 mEq/L — ABNORMAL LOW (ref 3.5–5.1)

## 2012-11-23 MED ORDER — IOHEXOL 300 MG/ML  SOLN
80.0000 mL | Freq: Once | INTRAMUSCULAR | Status: AC | PRN
  Administered 2012-11-23: 80 mL via INTRAVENOUS

## 2012-11-25 ENCOUNTER — Telehealth: Payer: Self-pay | Admitting: Oncology

## 2012-11-25 ENCOUNTER — Ambulatory Visit (HOSPITAL_BASED_OUTPATIENT_CLINIC_OR_DEPARTMENT_OTHER): Payer: Medicaid Other | Admitting: Oncology

## 2012-11-25 VITALS — BP 126/82 | HR 107 | Temp 97.8°F | Resp 19 | Ht 67.0 in | Wt 181.9 lb

## 2012-11-25 DIAGNOSIS — C321 Malignant neoplasm of supraglottis: Secondary | ICD-10-CM

## 2012-11-25 DIAGNOSIS — R220 Localized swelling, mass and lump, head: Secondary | ICD-10-CM

## 2012-11-25 DIAGNOSIS — R221 Localized swelling, mass and lump, neck: Secondary | ICD-10-CM

## 2012-11-25 DIAGNOSIS — I10 Essential (primary) hypertension: Secondary | ICD-10-CM

## 2012-11-25 DIAGNOSIS — Z87891 Personal history of nicotine dependence: Secondary | ICD-10-CM

## 2012-11-25 DIAGNOSIS — E039 Hypothyroidism, unspecified: Secondary | ICD-10-CM

## 2012-11-25 NOTE — Telephone Encounter (Signed)
gv and printed appt sched and avs for pt  °

## 2012-11-25 NOTE — Progress Notes (Signed)
[facility]  Telephone:([phone] [phone]) [phone]   OFFICE PROGRESS NOTE   Cc:  [name], MD  DIAGNOSIS:   History of pT4 N2 M0 squamous cell carcinoma of the supraglottic larynx with high-risk features including extracapsular extension, perineural invasion, and lymphovascular invasion.   PAST THERAPY: total laryngectomy and bilateral neck dissection on 07/31/2010. She received adjuvant chemoXRT with daily XRT and weekly cisplatin between 09/03/2010 and 10/01/2010. Chemotherapy ended two weeks prior to XRT due to poor toleration and dose limiting toxicity. Radiation concluded on 10/16/2010.   CURRENT THERAPY: watchful observation.   INTERVAL HISTORY: Deborah [name] 57 y.o. female returns for regular follow up by herself. She reports doing well.  Her neck is stiff and slightly swollen bilaterally.  She has severe neck fibrosis and cannot tell if there is discreet mass in the neck.  She is performing neck exercise on a routine basis.  She denied smoking, chewing tobacco, or drinking EtOH.  He lives by herself and is independent of all activities of daily living.   Patient denies fever, anorexia, weight loss, fatigue, headache, visual changes, confusion, drenching night sweats, palpable lymph node swelling, mucositis, odynophagia, dysphagia, nausea vomiting, jaundice, chest pain, palpitation, shortness of breath, dyspnea on exertion, productive cough, gum bleeding, epistaxis, hematemesis, hemoptysis, abdominal pain, abdominal swelling, early satiety, melena, hematochezia, hematuria, skin rash, spontaneous bleeding, joint swelling, joint pain, heat or cold intolerance, bowel bladder incontinence, back pain, focal motor weakness, paresthesia, depression.    Past Medical History  Diagnosis Date  . Hypertension   . Tracheostomy dependent   . History of laryngectomy   . Bronchitis   . Larynx cancer 07/31/2010    supraglotttic s/p chemo/radiation and surgical  rescection.  [name] Heart murmur     asymptomatic   . Leukocytopenia   . Asthma   . Sinusitis, chronic 07/20/2011  . Normal MRI 07/14/11    negative for mestasis   . Hx of radiation therapy 09/03/10 to 10/16/2010    supraglottic larynx  . 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 Neck pain 01/21/2012  . GERD (gastroesophageal reflux disease)   . Sepsis 08/04/12  . Seizures     07/24/11 off Effexor w/o seizure  . Anemia     Past Surgical History  Procedure Laterality Date  . Portacath placement  09/17/10    Tip in cavoatrial junction  . Laryngectomy    . Tracheal dilitation  07/16/2011    Procedure: TRACHEAL DILITATION;  Surgeon: [name], MD;  Location: [facility] OR;  Service: ENT;  Laterality: N/A;  dilation of tracheal stoma and replacement of stoma tube  . Foreign body removal bronchial  10/02/2011    Procedure: REMOVAL FOREIGN BODY BRONCHIAL;  Surgeon: Melvenia Beam, MD;  Location: [facility] OR;  Service: ENT;  Laterality: N/A;  . Esophagoscopy  06/21/2012    Procedure: ESOPHAGOSCOPY;  Surgeon: Serena Colonel, MD;  Location: [facility];  Service: ENT;  Laterality: N/A;  . Tubal ligation  1982  . Colonoscopy N/A 11/15/2012    Procedure: COLONOSCOPY;  Surgeon: Hart Carwin, MD;  Location: [facility];  Service: Endoscopy;  Laterality: N/A;    Current Outpatient Prescriptions  Medication Sig Dispense Refill  . albuterol (PROVENTIL HFA;VENTOLIN HFA) 108 (90 BASE) MCG/ACT inhaler Inhale 1-2 puffs into the lungs every 4 (four) hours as needed for wheezing or shortness of breath.  1 Inhaler  0  . buPROPion (WELLBUTRIN XL) 150 MG 24 hr tablet Take 150 mg  by mouth every morning.      . cyclobenzaprine (FLEXERIL) 10 MG tablet Take 1 tablet (10 mg total) by mouth 2 (two) times daily as needed for muscle spasms.  30 tablet  2  . levothyroxine (SYNTHROID, LEVOTHROID) 100 MCG tablet Take 1 tablet (100 mcg total) by mouth daily before breakfast.  30 tablet  3  . lisinopril (PRINIVIL,ZESTRIL) 20 MG tablet Take  20 mg by mouth every morning.      . loratadine (CLARITIN) 10 MG tablet Take 10 mg by mouth every morning.      . metoprolol tartrate (LOPRESSOR) 25 MG tablet Take 1 tablet (25 mg total) by mouth 2 (two) times daily.  180 tablet  1  . omeprazole (PRILOSEC) 40 MG capsule Take 40 mg by mouth every morning.      . ondansetron (ZOFRAN ODT) 4 MG disintegrating tablet Take 1 tablet (4 mg total) by mouth every 8 (eight) hours as needed for nausea.  20 tablet  0  . promethazine (PHENERGAN) 25 MG tablet Take 1 tablet (25 mg total) by mouth every 8 (eight) hours as needed for nausea.  20 tablet  0   No current facility-administered medications for this visit.    ALLERGIES:  has No Known Allergies.  REVIEW OF SYSTEMS:  The rest of the 14-point review of system was negative.   Filed Vitals:   11/25/12 0909  BP: 126/82  Pulse: 107  Temp: 97.8 F (36.6 C)  Resp: 19   Wt Readings from Last 3 Encounters:  11/25/12 181 lb 14.4 oz (82.509 kg)  11/09/12 184 lb (83.462 kg)  08/27/12 177 lb (80.287 kg)   ECOG Performance status: 1  PHYSICAL EXAMINATION:   General: well-nourished woman, in no acute distress. Eyes: no scleral icterus. ENT: There were no oropharyngeal lesions. Neck was without thyromegaly. Her neck was slightly more edematous than I could remember from past visit.  I could not appreciate any discreet mass due to severe fibrotic changes. Her trach stoma was dry/clean/intact. Lymphatics: Negative for supraclavicular or axillary adenopathy. Respiratory: lungs were clear bilaterally without wheezing or crackles. Cardiovascular: Regular rate and rhythm, S1/S2, without murmur, rub or gallop. There was no pedal edema. GI: abdomen was soft, flat, nontender, nondistended, without organomegaly. Muscoloskeletal: no spinal tenderness of palpation of vertebral spine especially cervical spine.  Skin exam was without echymosis, petichae. Neuro exam was nonfocal. Patient was able to get on and off exam table  without assistance. Gait was normal. Patient was alert and oriented. Attention was good. Language was appropriate. Mood was normal without depression. Speech was not pressured. Thought content was not tangential.    LABORATORY/RADIOLOGY DATA:  Lab Results  Component Value Date   WBC 5.9 11/23/2012   HGB 11.4* 11/23/2012   HCT 34.0* 11/23/2012   PLT 295 11/23/2012   GLUCOSE 83 11/23/2012   CHOL 153 03/20/2011   TRIG 74 03/20/2011   HDL 53 03/20/2011   LDLCALC 85 03/20/2011   ALKPHOS 115 11/23/2012   ALT 9 11/23/2012   AST 10 11/23/2012   NA 143 11/23/2012   K 3.2* 11/23/2012   CL 105 11/23/2012   CREATININE 1.0 11/23/2012   BUN 10.1 11/23/2012   CO2 28 11/23/2012   INR 1.00 08/02/2012     ASSESSMENT AND PLAN:    1. History of larynx cancer:  She has had visit with ENT and Rad Onc.  She has edematous changes in the neck.  Most likely this is part of treatment-related fibrosis.  However, there is no way to teel without CT neck due to her severe neck fibrosis.  I requested neck CT with contrast without the next 2 weeks.  2. History of smoking: She no longer smokes now.  3. Hypertension: on lisinopril, metoprolol per PCP. 4. 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 She is on levothyroxine. Her TSH today was within normal range.  No change to her current dose of levothyroxine.  5. Seizure: none since stopping Effexor.  6. Follow up: With me in clinic in about 12 months. I encouraged her to follow up with Rad Onc and ENT as well.     I informed Ms. Jeane that I am leaving the practice.  [facility] to see another provider when she returns.

## 2012-11-28 ENCOUNTER — Emergency Department (HOSPITAL_COMMUNITY): Payer: Medicaid Other

## 2012-11-28 ENCOUNTER — Emergency Department (HOSPITAL_COMMUNITY)
Admission: EM | Admit: 2012-11-28 | Discharge: 2012-11-28 | Disposition: A | Discharge status: Home / Self Care | Payer: Medicaid Other | Attending: Emergency Medicine | Admitting: Emergency Medicine

## 2012-11-28 ENCOUNTER — Encounter (HOSPITAL_COMMUNITY): Payer: Self-pay | Admitting: Emergency Medicine

## 2012-11-28 ENCOUNTER — Other Ambulatory Visit: Payer: Self-pay

## 2012-11-28 DIAGNOSIS — Z79899 Other long term (current) drug therapy: Secondary | ICD-10-CM | POA: Insufficient documentation

## 2012-11-28 DIAGNOSIS — Z8619 Personal history of other infectious and parasitic diseases: Secondary | ICD-10-CM | POA: Insufficient documentation

## 2012-11-28 DIAGNOSIS — I10 Essential (primary) hypertension: Secondary | ICD-10-CM | POA: Insufficient documentation

## 2012-11-28 DIAGNOSIS — Z8521 Personal history of malignant neoplasm of larynx: Secondary | ICD-10-CM | POA: Insufficient documentation

## 2012-11-28 DIAGNOSIS — Z9889 Other specified postprocedural states: Secondary | ICD-10-CM | POA: Insufficient documentation

## 2012-11-28 DIAGNOSIS — Z8709 Personal history of other diseases of the respiratory system: Secondary | ICD-10-CM | POA: Insufficient documentation

## 2012-11-28 DIAGNOSIS — Z87891 Personal history of nicotine dependence: Secondary | ICD-10-CM | POA: Insufficient documentation

## 2012-11-28 DIAGNOSIS — J45909 Unspecified asthma, uncomplicated: Secondary | ICD-10-CM | POA: Insufficient documentation

## 2012-11-28 DIAGNOSIS — T17908A Unspecified foreign body in respiratory tract, part unspecified causing other injury, initial encounter: Secondary | ICD-10-CM

## 2012-11-28 DIAGNOSIS — J329 Chronic sinusitis, unspecified: Secondary | ICD-10-CM | POA: Insufficient documentation

## 2012-11-28 DIAGNOSIS — Z8669 Personal history of other diseases of the nervous system and sense organs: Secondary | ICD-10-CM | POA: Insufficient documentation

## 2012-11-28 DIAGNOSIS — R011 Cardiac murmur, unspecified: Secondary | ICD-10-CM | POA: Insufficient documentation

## 2012-11-28 DIAGNOSIS — Z862 Personal history of diseases of the blood and blood-forming organs and certain disorders involving the immune mechanism: Secondary | ICD-10-CM | POA: Insufficient documentation

## 2012-11-28 DIAGNOSIS — R0602 Shortness of breath: Secondary | ICD-10-CM | POA: Insufficient documentation

## 2012-11-28 DIAGNOSIS — Y833 Surgical operation with formation of external stoma as the cause of abnormal reaction of the patient, or of later complication, without mention of misadventure at the time of the procedure: Secondary | ICD-10-CM | POA: Insufficient documentation

## 2012-11-28 DIAGNOSIS — K219 Gastro-esophageal reflux disease without esophagitis: Secondary | ICD-10-CM | POA: Insufficient documentation

## 2012-11-28 DIAGNOSIS — T17900A Unspecified foreign body in respiratory tract, part unspecified causing asphyxiation, initial encounter: Secondary | ICD-10-CM

## 2012-11-28 DIAGNOSIS — E039 Hypothyroidism, unspecified: Secondary | ICD-10-CM | POA: Insufficient documentation

## 2012-11-28 DIAGNOSIS — Z923 Personal history of irradiation: Secondary | ICD-10-CM | POA: Insufficient documentation

## 2012-11-28 DIAGNOSIS — Z930 Tracheostomy status: Secondary | ICD-10-CM | POA: Insufficient documentation

## 2012-11-28 DIAGNOSIS — J9503 Malfunction of tracheostomy stoma: Secondary | ICD-10-CM | POA: Insufficient documentation

## 2012-11-28 MED ORDER — HEPARIN SOD (PORK) LOCK FLUSH 100 UNIT/ML IV SOLN
500.0000 [IU] | INTRAVENOUS | Status: AC | PRN
  Administered 2012-11-28: 500 [IU]

## 2012-11-28 NOTE — ED Notes (Signed)
IV team paged.  

## 2012-11-28 NOTE — ED Notes (Signed)
MD at bedside. 

## 2012-11-28 NOTE — ED Notes (Signed)
IV team at bedside 

## 2012-11-28 NOTE — Consult Note (Signed)
[place], [place] [id number] [birth year] Gwyneth Sprout, MD  Reason for Consult: coughed out/aspirated tracheoesophageal puncture prosthesis  HPI:  57yo patient with history of laryngectomy and tracheoesophageal puncture by Dr. [name] who has coughed out/aspirated her TEP prosthesis on multiple occasions. I performed bronchoscopy and removal of right mainstem TEP prosthesis several months ago for the same problem. She presented to the ER today having aspirated her TEP prosthesis. This time she was able to cough it out of her left mainstem bronchus but I was consulted to place a feeding tube in the TE puncture until she can see Speech pathology at [facility] to have the TEP prosthesis replaced.   Allergies: No Known Allergies  ROS: cough, otherwise negative x 10 systems except per HPI  PMH:  Past Medical History  Diagnosis Date  . Hypertension   . Tracheostomy dependent   . History of laryngectomy   . Bronchitis   . 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  [name] Heart murmur     asymptomatic   . Leukocytopenia   . Asthma   . Sinusitis, chronic 07/20/2011  . Normal MRI 07/14/11    negative for mestasis   . Hx of radiation therapy 09/03/10 to 10/16/2010    supraglottic larynx  . 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 Neck pain 01/21/2012  . GERD (gastroesophageal reflux disease)   . Sepsis 08/04/12  . Seizures     07/24/11 off Effexor w/o seizure  . Anemia     FH:  Family History  Problem Relation Age of Onset  . Heart disease Mother   . Heart disease Father   . Heart disease Sister     SH:  History   Social History  . Marital Status: Divorced    Spouse Name: N/A    Number of Children: N/A  . Years of Education: 12   Occupational History  . Not on file.   Social History Main Topics  . Smoking status: Former Smoker -- 2.00 packs/day for 20 years    Types: Cigarettes    Quit date: 09/17/2010  . Smokeless tobacco: Never Used  . Alcohol  Use: No     Comment: Former drinker 6 months ago  . Drug Use: Yes    Special: Cocaine     Comment: tried cocaine 1 time 2010 only used 1 time  . Sexually Active: Yes    Birth Control/ Protection: Surgical   Other Topics Concern  . Not on file   Social History Narrative   Unemployed. Receives SSI. Lives with one of her 2 adult daughters Sheronica Corey) and her sister [name])     PSH:  Past Surgical History  Procedure Laterality Date  . Portacath placement  09/17/10    Tip in cavoatrial junction  . Laryngectomy    . Tracheal dilitation  07/16/2011    Procedure: TRACHEAL DILITATION;  Surgeon: [name], MD;  Location: [facility] OR;  Service: ENT;  Laterality: N/A;  dilation of tracheal stoma and replacement of stoma tube  . Foreign body removal bronchial  10/02/2011    Procedure: REMOVAL FOREIGN BODY BRONCHIAL;  Surgeon: Melvenia Beam, MD;  Location[facility] OR;  Service: ENT;  Laterality: N/A;  . Esophagoscopy  06/21/2012    Procedure: ESOPHAGOSCOPY;  Surgeon: Serena Colonel, MD;  Location: [facility];  Service: ENT;  Laterality: N/A;  . Tubal ligation  1982  . Colonoscopy N/A 11/15/2012    Procedure: COLONOSCOPY;  Surgeon: Hart Carwin, MD;  Location: [facility];  Service: Endoscopy;  Laterality: N/A;     Labs/studies: chest Xray shows left lung atelectasis consistent with left mainstem foreign body aspiration but no evidence of the TEP prosthesis in left mainstem bronchus (patient coughed this out).  Physical  Exam: CN 2-12 grossly intact and symmetric. EAC/TMs normal BL. Oral cavity, lips, gums, ororpharynx normal with no masses or lesions. Skin warm and dry. Nasal cavity without polyps or purulence. External nose and ears without masses or lesions. EOMI, PERRLA. Neck supple with no masses or lesions, has patent laryngectomy stoma that is well-healed with a patent tracheoesophageal puncture with esophageal secretions coming through, patient has TEP prosthesis in  medicine cup. Well-healed neck incisions.  Procedure Note: 31575 flexible fiberoptic tracheoscopy. Informed verbal consent was obtained after explaining the risks (including bleeding and infection), benefits and alternatives of the procedure. Verbal timeout was performed prior to the procedure. The 4mm flexible scope was advanced through the laryngectomy stoma. The TEpuncture is patent. The trachea is normal with no masses or lesions and there are some mild/moderate inspissated physiologic secretions in the left mainstem bronchus but the mainstem bronchi and choana are widely patent bilaterally and the patient is easily able to cough the secretions out. No evidence of any mainstem bronchial or carinal masses, lesions, or foreign bodies are seen. I placed a small amount of sterile surgical lubricant on a new 10Fr salem sump feeding tube and carefully and easily advanced this through her tracheoesophageal puncture and secured it at 40 cm with tape, and I closed off the open feeding tube end with the plug. The patient tolerated the procedure with no immediate complications.  A/P: aspirated then coughed out her tracheoesophageal puncture prosthesis. The TEP was cannulated with a feeding tube which will keep this patent until she can see Speech Pathology in the next few days to have her TEP prosthesis replaced. She can resume her regular diet as tolerated and should leave the feeding tube in place. She should cough several times per hour to get up the secretions in her lungs and to prevent further atelectasis. She can see Dr. [name] and/or Speech Pathology at [facility] back at her convenience. Her tracheoscopy was otherwise normal.   Melvenia Beam 11/28/2012 3:01 PM

## 2012-11-28 NOTE — ED Notes (Signed)
Per EMS at 1030 this AM pt trach fell down into her airway. Pt states she can feel it in her Right chest. Pt denies injury.

## 2012-11-28 NOTE — ED Notes (Signed)
Pt SpO2 decreased to 88%. Airway suctioned using sterile technique. Pt tolerated procedure well. SpO2 increased to 94%. Airway clear at this time.

## 2012-11-28 NOTE — ED Notes (Signed)
MD at bedside.Pt updated on plan of care.

## 2012-11-28 NOTE — ED Notes (Signed)
ENT at bedside

## 2012-11-28 NOTE — ED Notes (Signed)
Pt states she coughed up the part of her trach that was in her throat. Will notify MD.

## 2012-11-28 NOTE — ED Notes (Signed)
Respiratory therapy at bedside.

## 2012-11-28 NOTE — ED Provider Notes (Addendum)
History     CSN: [id number]  Arrival date & time 11/28/12  1122   First MD Initiated Contact with Patient 11/28/12 1136      Chief Complaint  Patient presents with  . Tracheostomy Tube Change  . Shortness of Breath    (Consider location/radiation/quality/duration/timing/severity/associated sxs/prior treatment) Patient is a 57 y.o. female presenting with shortness of breath. The history is provided by the patient.  Shortness of Breath Severity:  Mild Onset quality:  Sudden Timing:  Constant Progression:  Unchanged Relieved by:  Nothing Worsened by:  Nothing tried Associated symptoms comment:  States that her voice prosthesis fell down her trach stoma.    Past Medical History  Diagnosis Date  . Hypertension   . Tracheostomy dependent   . History of laryngectomy   . Bronchitis   . 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  [name] Heart murmur     asymptomatic   . Leukocytopenia   . Asthma   . Sinusitis, chronic 07/20/2011  . Normal MRI 07/14/11    negative for mestasis   . Hx of radiation therapy 09/03/10 to 10/16/2010    supraglottic larynx  . 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 Neck pain 01/21/2012  . GERD (gastroesophageal reflux disease)   . Sepsis 08/04/12  . Seizures     07/24/11 off Effexor w/o seizure  . Anemia     Past Surgical History  Procedure Laterality Date  . Portacath placement  09/17/10    Tip in cavoatrial junction  . Laryngectomy    . Tracheal dilitation  07/16/2011    Procedure: TRACHEAL DILITATION;  Surgeon: [name], MD;  Location: [facility] OR;  Service: ENT;  Laterality: N/A;  dilation of tracheal stoma and replacement of stoma tube  . Foreign body removal bronchial  10/02/2011    Procedure: REMOVAL FOREIGN BODY BRONCHIAL;  Surgeon: Melvenia Beam, MD;  Location: [facility] OR;  Service: ENT;  Laterality: N/A;  . Esophagoscopy  06/21/2012    Procedure: ESOPHAGOSCOPY;  Surgeon: Serena Colonel, MD;  Location: [facility];  Service: ENT;  Laterality: N/A;  . Tubal ligation  1982  . Colonoscopy N/A 11/15/2012    Procedure: COLONOSCOPY;  Surgeon: Hart Carwin, MD;  Location: [facility];  Service: Endoscopy;  Laterality: N/A;    Family History  Problem Relation Age of Onset  . Heart disease Mother   . Heart disease Father   . Heart disease Sister     History  Substance Use Topics  . Smoking status: Former Smoker -- 2.00 packs/day for 20 years    Types: Cigarettes    Quit date: 09/17/2010  . Smokeless tobacco: Never Used  . Alcohol Use: No     Comment: Former drinker 6 months ago    OB History   Grav Para Term Preterm Abortions TAB SAB Ect Mult Living                  Review of Systems  Respiratory: Positive for shortness of breath.   All other systems reviewed and are negative.    Allergies  Review of patient's allergies indicates no known allergies.  Home Medications   Current Outpatient Rx  Name  Route  Sig  Dispense  Refill  . albuterol (PROVENTIL HFA;VENTOLIN HFA) 108 (90 BASE) MCG/ACT inhaler   Inhalation   Inhale 1-2 puffs into the lungs every 4 (four) hours as needed for wheezing or shortness of breath.  1 Inhaler   0   . buPROPion (WELLBUTRIN XL) 150 MG 24 hr tablet   Oral   Take 150 mg by mouth every morning.         . cyclobenzaprine (FLEXERIL) 10 MG tablet   Oral   Take 1 tablet (10 mg total) by mouth 2 (two) times daily as needed for muscle spasms.   30 tablet   2   . levothyroxine (SYNTHROID, LEVOTHROID) 100 MCG tablet   Oral   Take 1 tablet (100 mcg total) by mouth daily before breakfast.   30 tablet   3   . lisinopril (PRINIVIL,ZESTRIL) 20 MG tablet   Oral   Take 20 mg by mouth every morning.         . loratadine (CLARITIN) 10 MG tablet   Oral   Take 10 mg by mouth every morning.         . metoprolol tartrate (LOPRESSOR) 25 MG tablet   Oral   Take 1 tablet (25 mg total) by mouth 2 (two) times daily.   180 tablet   1   .  omeprazole (PRILOSEC) 40 MG capsule   Oral   Take 40 mg by mouth every morning.         . ondansetron (ZOFRAN ODT) 4 MG disintegrating tablet   Oral   Take 1 tablet (4 mg total) by mouth every 8 (eight) hours as needed for nausea.   20 tablet   0   . promethazine (PHENERGAN) 25 MG tablet   Oral   Take 1 tablet (25 mg total) by mouth every 8 (eight) hours as needed for nausea.   20 tablet   0     BP 179/158  Pulse 111  Temp(Src) 98.4 F (36.9 C) (Oral)  Resp 20  SpO2 91%  Physical Exam  Nursing note and vitals reviewed. Constitutional: She is oriented to person, place, and time. She appears well-developed and well-nourished. No distress.  HENT:  Head: Normocephalic and atraumatic.  Eyes: Conjunctivae and EOM are normal. Pupils are equal, round, and reactive to light.  Neck:  Stoma with mild secretions  Cardiovascular: Normal rate, regular rhythm, normal heart sounds and intact distal pulses.   No murmur heard. Pulmonary/Chest: Effort normal and breath sounds normal. No respiratory distress. She has no wheezes. She has no rales.  Neurological: She is alert and oriented to person, place, and time. She has normal strength. No sensory deficit.  Skin: Skin is warm and dry. No rash noted. No erythema.  Psychiatric: She has a normal mood and affect. Her behavior is normal.    ED Course  Procedures (including critical care time)  Labs Reviewed - No data to display Dg Chest Portable 1 View  11/28/2012   *RADIOLOGY REPORT*  Clinical Data: Status post tracheostomy tube exchanged.  Shortness of breath.  PORTABLE CHEST - 1 VIEW  Comparison: CT chest 11/23/2012 and plain film of the chest 08/02/2012.  Findings: There is volume loss in the left chest with diffuse, hazy airspace disease throughout.  The right lung is clear.  Heart size is normal.  Tracheostomy tube is difficult to visualize. Right Port-A-Cath again seen.  IMPRESSION: Volume loss in the left chest with diffuse, hazy  airspace disease is most in keeping with atelectasis rather than pneumonia. Tracheostomy tube is not well demonstrated.   Original Report Authenticated By: Holley Dexter, M.D.     1. Foreign body aspiration, initial encounter       MDM  Patient presents do to her voice prosthesis falling down her trach stoma. She states this is the third time this has happened and she can feel it in her right chest. She denies any other complaints and is currently satting 95% and in no acute distress.  Looking back patient had to have foreign body removed from her right mainstem bronchus in April of last year by ENT. Feel this is probably recurrent.  We'll get plain film and speak with ENT   3:07 PM Plain film without acute changes.  While waiting for CXR pt coughed the PET up.  Spoke with ENT and they will come in and place a catheter to keep stoma open and pt can f/u with [facility] on Monday for replacement.     Gwyneth Sprout, MD 11/28/12 1510  Gwyneth Sprout, MD 11/28/12 1601

## 2012-11-29 ENCOUNTER — Ambulatory Visit (HOSPITAL_COMMUNITY): Payer: Medicaid - Dental | Admitting: Dentistry

## 2012-11-29 ENCOUNTER — Encounter (HOSPITAL_COMMUNITY): Payer: Self-pay | Admitting: Dentistry

## 2012-11-29 VITALS — BP 164/94 | HR 92 | Temp 98.9°F

## 2012-11-29 DIAGNOSIS — K117 Disturbances of salivary secretion: Secondary | ICD-10-CM

## 2012-11-29 DIAGNOSIS — Z972 Presence of dental prosthetic device (complete) (partial): Secondary | ICD-10-CM

## 2012-11-29 DIAGNOSIS — K08109 Complete loss of teeth, unspecified cause, unspecified class: Secondary | ICD-10-CM

## 2012-11-29 DIAGNOSIS — K0889 Other specified disorders of teeth and supporting structures: Secondary | ICD-10-CM

## 2012-11-29 NOTE — Patient Instructions (Addendum)
Return to clinic tomorrow for insertion of upper and lower complete denture relines.  Dr. Kristin Bruins

## 2012-11-29 NOTE — Progress Notes (Signed)
11/29/2012  Patient:            [name] Date of Birth:  May 13, 1956 MRN:                [id number]  BP 164/94  Pulse 92  Temp(Src) 98.9 F (37.2 C) (Oral)  Deborah [name] presents for reline impressions for the upper and lower complete dentures. Procedure: Upper and lower border molding and final impressions in Isofunctional compound. Increased retention and stability obtained. Patient tolerated procedure well. To [facility] for lab reline procedure with 50:50 [place] 199. Return to clinic for insertion of upper and lower complete denture relines  Charlynne Pander, DDS

## 2012-11-30 ENCOUNTER — Ambulatory Visit (HOSPITAL_COMMUNITY): Payer: Medicaid - Dental | Admitting: Dentistry

## 2012-11-30 ENCOUNTER — Encounter (HOSPITAL_COMMUNITY): Payer: Self-pay | Admitting: Dentistry

## 2012-11-30 VITALS — BP 141/75 | HR 79 | Temp 98.5°F

## 2012-11-30 DIAGNOSIS — K0889 Other specified disorders of teeth and supporting structures: Secondary | ICD-10-CM

## 2012-11-30 DIAGNOSIS — K08109 Complete loss of teeth, unspecified cause, unspecified class: Secondary | ICD-10-CM

## 2012-11-30 DIAGNOSIS — K117 Disturbances of salivary secretion: Secondary | ICD-10-CM

## 2012-11-30 DIAGNOSIS — Z463 Encounter for fitting and adjustment of dental prosthetic device: Secondary | ICD-10-CM

## 2012-11-30 DIAGNOSIS — Z972 Presence of dental prosthetic device (complete) (partial): Secondary | ICD-10-CM

## 2012-11-30 NOTE — Progress Notes (Signed)
11/30/2012  Patient:            Deborah [name] Date of Birth:  [birth year] MRN:                [id number]  BP 141/75  Pulse 79  Temp(Src) 98.5 F (36.9 C) (Oral)  Deborah [name] presents for insertion of upper and lower complete denture relines. Procedure: Pressure indicating paste applied to dentures. Adjustments made as needed. [place]. Occlusion evaluated and adjustments made as needed for Centric Relation and protrusive strokes. Patient with tendency to protrude to and and position. Patient was instructed on proper maximum intercuspation position that avoids contacting the maxillary and mandibular anterior teeth. Good esthetics, phonetics, fit, and function noted. Patient accepts results. Post op instructions provided in written and verbal formats on use and care of dentures. Patient to keep dentures out if sore spots develop. Use salt water rinses as needed to aid healing. Return to clinic as scheduled for denture adjustment.  Call if problems arise before then. Patient dismissed in stable condition.  Charlynne Pander, DDS

## 2012-11-30 NOTE — Patient Instructions (Signed)
Instructions for Denture Use and Care  Congratulations, you are on the way to oral rehabilitation!  You have just received a new set of complete or partial dentures.  These prostheses will help to improve both your appearance and chewing ability.  These instructions will help you get adjusted to your dentures as well as care for them properly.  Please read these instructions carefully and completely as soon as you get home.  If you or your caregiver have any questions please notify the [facility] at [phone].  HOW YOUR DENTURES LOOK AND FEEL Soon after you begin wearing your dentures, you may feel that your dentures are too large or even loose.  As our mouth and facial muscles become accustomed to the dentures, these feelings will go away.  You also may feel that you are salivating more than you normally do.  This feeling should go away as you get used to having the dentures in your mouth.  You may bite your cheek or your tongue; this will eventually resolve itself as you wear your dentures.  Some soreness is to be expected, but you should not hurt.  If your mouth hurts, call your dentist.  A denture adhesive may occasionally be necessary to hold your dentures in place more securely.  The dentist will let you know when one is recommended for you.  SPEAKING Wearing dentures will change the sound of your voice initially.  This will be noticed by you more than anyone else.  Bite and swallow before you speak, in order to place your dentures in position so that you may speak more clearly.  Practice speaking by reading aloud or counting from 1 to 100 very slowly and distinctly.  After some practice your mouth will become accustomed to your dentures and you will speak more clearly.  EATING Chewing will definitely be different after you receive your dentures.  With a little practice and patience you should be able to eat just about any kind of food.  Begin by eating small quantities of food  that are cut into small pieces.  Star with soft foods such as eggs, cooked vegetables, or puddings.  As you gain confidence advance  Your diet to whatever texture foods you can tolerate.  DENTURE CARE Dentures can collect plaque and calculus much the same as natural teeth can.  If not removed on a regular basis, your dentures will not look or feel clean, and you will experience denture odor.  It is very important that you remove your dentures at bedtime and clean them thoroughly.  You should: 1. Clean your dentures over a sink full of water so if dropped, breakage will be prevented. 2. Rinse your dentures with cool water to remove any large food particles. 3. Use soap and water or a denture cleanser or paste to clean the dentures.  Do not use regular toothpaste as it may abrade the denture base or teeth. 4. Use a moistened denture brush to clean all surfaces (inside and outside). 5. Rinse thoroughly to remove any remaining soap or denture cleanser. 6. Use a soft bristle toothbrush to gently brush any natural teeth, gums, tongue, and palate at bedtime and before reinserting your dentures. 7. Do not sleep with your dentures in your mouth at night.  Remove your dentures and soak them overnight in a denture cup filled with water or denture solution as recommended by your dentist.  This routine will become second nature and will increase the life and comfort   of your dentures.  Please do not try to adjust these dentures yourself; you could damage them.  FOLLOW-UP You should call or make an appointment with your dentist.  Your dentist would like to see you at least once a year for a check-up and examination. 

## 2012-12-03 ENCOUNTER — Ambulatory Visit (INDEPENDENT_AMBULATORY_CARE_PROVIDER_SITE_OTHER): Payer: Medicaid Other | Admitting: Family Medicine

## 2012-12-03 VITALS — BP 171/104 | HR 96 | Temp 98.3°F | Ht 67.0 in | Wt 178.0 lb

## 2012-12-03 DIAGNOSIS — R1310 Dysphagia, unspecified: Secondary | ICD-10-CM

## 2012-12-03 MED ORDER — TRAMADOL HCL 50 MG PO TABS: 50.0000 mg | ORAL_TABLET | Freq: Four times a day (QID) | ORAL | Status: DC | PRN

## 2012-12-03 MED ORDER — MELOXICAM 15 MG PO TABS: 15.0000 mg | ORAL_TABLET | Freq: Every day | ORAL | Status: DC

## 2012-12-03 MED ORDER — KETOROLAC TROMETHAMINE 30 MG/ML IJ SOLN
30.0000 mg | Freq: Once | INTRAMUSCULAR | Status: AC
  Administered 2012-12-03: 30 mg via INTRAMUSCULAR

## 2012-12-03 NOTE — Progress Notes (Signed)
  Subjective:    Patient ID: Deborah [name], female    DOB: June 02, 1956, 57 y.o.   MRN: [id number]  HPI # She was in the ED on Sunday 06/15 after her tracheoesophageal puncture prosthesis got loose and went down her throat to her right lung. She was there for 8 hours until she was able to cough it up. She coughed a lot that day.  She went to [facility] 06/16 and Dr. Judie Grieve put her prosthesis back.   She was doing okay for a few days but then on Wednesday she started having a sore throat and problems swallow. She is able to take liquids currently if she drinks really slowly; she has not been able to take any of her medications.   She tried calling ENT practices but could not get through to anyone to make an appointment.   Review of Systems Per HPI Denies fevers, chills, diarrhea, abdominal pain, constipation, dyspnea  Allergies, medication, past medical history reviewed.  Smoking status noted.     Objective:   Physical Exam GEN: NAD NECK: trach in place without surrounding abnormal skin changes PULM: NI WOB; CTAB without w/r/r    Assessment & Plan:

## 2012-12-03 NOTE — Patient Instructions (Addendum)
Toradol shot today (for pain and inflammation)  Pick up meloxicam today; take 1 tablet a day for above reasons If you still are in pain, try Tramadol as needed.   If you are still not able to swallow, go to ED

## 2012-12-05 DIAGNOSIS — R1310 Dysphagia, unspecified: Secondary | ICD-10-CM | POA: Insufficient documentation

## 2012-12-05 NOTE — Assessment & Plan Note (Signed)
She probably has some trauma in there throat with swelling following her swallowing her prosthesis and having to cough it up.  She seems to have difficulty swallowing because of pain and probably partial obstruction from trauma.  -Try NSAID and Tramadol prn to help with swelling and pain  -If she is unable to swallow her medications, has worsening symptoms, difficulty breathing, unable to keep down liquids, go to the ED

## 2012-12-06 ENCOUNTER — Ambulatory Visit (HOSPITAL_COMMUNITY): Payer: Medicaid - Dental | Admitting: Dentistry

## 2012-12-06 ENCOUNTER — Encounter (HOSPITAL_COMMUNITY): Payer: Self-pay | Admitting: Dentistry

## 2012-12-06 VITALS — BP 140/92 | HR 76 | Temp 98.2°F

## 2012-12-06 DIAGNOSIS — Z463 Encounter for fitting and adjustment of dental prosthetic device: Secondary | ICD-10-CM

## 2012-12-06 DIAGNOSIS — K08109 Complete loss of teeth, unspecified cause, unspecified class: Secondary | ICD-10-CM

## 2012-12-06 DIAGNOSIS — Z972 Presence of dental prosthetic device (complete) (partial): Secondary | ICD-10-CM

## 2012-12-06 DIAGNOSIS — K117 Disturbances of salivary secretion: Secondary | ICD-10-CM

## 2012-12-06 NOTE — Progress Notes (Signed)
12/06/2012  Patient:            Deborah [name] Date of Birth:  [birth year] MRN:                [id number]  BP 140/92  Pulse 76  Temp(Src) 98.2 F (36.8 C) (Oral)   Kathrynn Running Roehrs presents for evaluation and adjustment of recently inserted upper and lower complete denture relines. SUBJECTIVE: Patient did not have any problems with the upper denture. Patient is complaining of some lower left lingual irritation in the area of the premolars. OBJECTIVE: No sign of denture irritation or ulceration is noted. Procedure: Pressure indicating paste applied to dentures. Adjustments made as needed. [place]. Occlusion evaluated and adjustments made as needed for Centric Relation and protrusive strokes. Patient still with tendency to protrude into an end to end position. Patient was again instructed on proper maximum intercuspation position that avoids contacting the maxillary and mandibular anterior teeth. Patient accepts results. Patient to keep dentures out if sore spots develop. Use salt water rinses as needed to aid healing. Return to clinic as scheduled for denture adjustment.  Call if problems arise before then. Patient dismissed in stable condition.  Charlynne Pander, DDS

## 2012-12-06 NOTE — Patient Instructions (Signed)
Patient to keep dentures out if sore spots arise. Patient to use salt water rinses as needed to aid healing. Patient to return to clinic as scheduled or call if problems arise before then. Charlynne Pander, DDS

## 2012-12-08 ENCOUNTER — Telehealth: Payer: Self-pay | Admitting: *Deleted

## 2012-12-08 NOTE — Telephone Encounter (Signed)
CT scan has not been approved by pt's insurance yet.  Radiology has to cancel appt for tomorrow and r/s when and if scan gets approved. Pending a Peer to Peer call from Dr. Gaylyn Rong.   Dr. Gaylyn Rong is aware.  Called pt and notified her of above.  She verbalized understanding and will wait to hear from [place] about rescheduling.

## 2012-12-09 ENCOUNTER — Ambulatory Visit (HOSPITAL_COMMUNITY)
Admission: RE | Admit: 2012-12-09 | Discharge: 2012-12-09 | Disposition: A | Discharge status: Home / Self Care | Payer: Medicaid Other | Source: Ambulatory Visit | Attending: Oncology | Admitting: Oncology

## 2012-12-23 ENCOUNTER — Ambulatory Visit (HOSPITAL_BASED_OUTPATIENT_CLINIC_OR_DEPARTMENT_OTHER): Payer: Medicaid Other

## 2012-12-23 VITALS — BP 147/95 | HR 68 | Temp 98.0°F | Resp 18

## 2012-12-23 DIAGNOSIS — Z452 Encounter for adjustment and management of vascular access device: Secondary | ICD-10-CM

## 2012-12-23 DIAGNOSIS — C321 Malignant neoplasm of supraglottis: Secondary | ICD-10-CM

## 2012-12-23 MED ORDER — SODIUM CHLORIDE 0.9 % IJ SOLN
10.0000 mL | INTRAMUSCULAR | Status: DC | PRN
  Administered 2012-12-23: 10 mL via INTRAVENOUS
  Filled 2012-12-23: qty 10

## 2012-12-23 MED ORDER — HEPARIN SOD (PORK) LOCK FLUSH 100 UNIT/ML IV SOLN
500.0000 [IU] | Freq: Once | INTRAVENOUS | Status: AC
  Administered 2012-12-23: 500 [IU] via INTRAVENOUS
  Filled 2012-12-23: qty 5

## 2012-12-23 NOTE — Patient Instructions (Signed)
Patient to call MD for problems or concerns 

## 2012-12-23 NOTE — Progress Notes (Signed)
Patient here for port flush. Patient completed treatment May 2013 and port has only been used once since that time when she was an inpatient.  Unable to get a blood return.  Port flushes easily.  Flushed and heparinized.  Hopefully will get a blood return when she returns for flush in August.

## 2012-12-27 ENCOUNTER — Other Ambulatory Visit: Payer: Medicaid Other

## 2012-12-27 DIAGNOSIS — E039 Hypothyroidism, unspecified: Secondary | ICD-10-CM

## 2012-12-27 NOTE — Progress Notes (Signed)
TSH DONE TODAY Deborah [name] 

## 2012-12-29 ENCOUNTER — Telehealth: Payer: Self-pay | Admitting: Family Medicine

## 2012-12-29 ENCOUNTER — Encounter: Payer: Self-pay | Admitting: Family Medicine

## 2012-12-29 NOTE — Telephone Encounter (Signed)
Called patient left VM. TSH high: I need to know the following: 1. Are you taking daily synthroid, what time, with or without food, with or without other meds?  If taking synthroid daily, on empty stomach without other meds. Dose needs to be increased.   Results letter sent as well.

## 2013-01-05 ENCOUNTER — Other Ambulatory Visit: Payer: Self-pay | Admitting: Family Medicine

## 2013-01-05 ENCOUNTER — Encounter (HOSPITAL_COMMUNITY): Payer: Self-pay | Admitting: Dentistry

## 2013-01-05 MED ORDER — TRAMADOL HCL 50 MG PO TABS: 50.0000 mg | ORAL_TABLET | Freq: Three times a day (TID) | ORAL | Status: DC | PRN

## 2013-01-05 NOTE — Telephone Encounter (Signed)
Filled flexeril rx. Did not refill mobic due to need for f/u. Check Cr.  Called in refill for tramadol.

## 2013-01-05 NOTE — Telephone Encounter (Signed)
Tramadol phoned in.

## 2013-01-13 ENCOUNTER — Telehealth: Payer: Self-pay | Admitting: Family Medicine

## 2013-01-13 NOTE — Telephone Encounter (Signed)
Pt reports that she takes meds everyday at 6 am and would like to know what she should do about her thyroid being high? PLease advise. Wyatt Haste, RN-BSN

## 2013-01-13 NOTE — Telephone Encounter (Signed)
Pt is calling for advice on her medication and would like a nurse to call her. JW

## 2013-01-14 MED ORDER — LEVOTHYROXINE SODIUM 125 MCG PO TABS: 125.0000 ug | ORAL_TABLET | Freq: Every day | ORAL | Status: DC

## 2013-01-14 NOTE — Telephone Encounter (Signed)
Please call patient with the following message   The patient will need to increase Synthroid dose from 100 mcg daily 125 mcg daily.  I sent a prescription to her pharmacy.  She'll need to followup in 8 weeks for repeat TSH.

## 2013-01-14 NOTE — Telephone Encounter (Signed)
Pt called and given message - verbalized understanding. Elizabeth Ryver Poblete, RN-BSN  

## 2013-01-18 ENCOUNTER — Encounter (HOSPITAL_COMMUNITY): Payer: Self-pay | Admitting: Dentistry

## 2013-01-18 ENCOUNTER — Ambulatory Visit (HOSPITAL_COMMUNITY): Payer: Medicaid - Dental | Admitting: Dentistry

## 2013-01-18 VITALS — BP 118/68 | HR 73 | Temp 99.0°F

## 2013-01-18 DIAGNOSIS — K08109 Complete loss of teeth, unspecified cause, unspecified class: Secondary | ICD-10-CM

## 2013-01-18 DIAGNOSIS — K117 Disturbances of salivary secretion: Secondary | ICD-10-CM

## 2013-01-18 DIAGNOSIS — Z463 Encounter for fitting and adjustment of dental prosthetic device: Secondary | ICD-10-CM

## 2013-01-18 DIAGNOSIS — K1370 Unspecified lesions of oral mucosa: Secondary | ICD-10-CM

## 2013-01-18 NOTE — Patient Instructions (Signed)
Patient to keep dentures out if sore spots develop. Use salt water rinses as needed to aid healing. Return to clinic as scheduled for denture adjustment.   Call if problems arise before then. Dr. Kulinski  

## 2013-01-18 NOTE — Progress Notes (Signed)
01/18/2013  Patient:            Deborah [name] Date of Birth:  07-24-1955 MRN:                [id number]  BP 118/68  Pulse 73  Temp(Src) 99 F (37.2 C) (Oral)   Kathrynn Running Ravelo presents for evaluation and adjustment of recently inserted upper and lower complete denture relines. SUBJECTIVE: Patient does not have any problems with the upper or lower dentures.  OBJECTIVE: No sign of denture irritation or ulceration is noted. Procedure: Pressure indicating paste applied to dentures. Adjustments made as needed. [place]. Occlusion evaluated and no adjustments were needed. Patient is able to find maximum intercuspation position today.  Patient accepts results. Patient to keep dentures out if sore spots develop. Use salt water rinses as needed to aid healing. Return to clinic as scheduled for denture adjustment.  Call if problems arise before then. Patient dismissed in stable condition.  Charlynne Pander, DDS

## 2013-01-20 ENCOUNTER — Other Ambulatory Visit: Payer: Self-pay | Admitting: *Deleted

## 2013-01-20 MED ORDER — OMEPRAZOLE 40 MG PO CPDR: 40.0000 mg | DELAYED_RELEASE_CAPSULE | Freq: Every morning | ORAL | Status: DC

## 2013-01-26 ENCOUNTER — Ambulatory Visit (HOSPITAL_BASED_OUTPATIENT_CLINIC_OR_DEPARTMENT_OTHER): Payer: Medicaid Other

## 2013-01-26 VITALS — BP 127/81 | Temp 99.2°F | Resp 22

## 2013-01-26 DIAGNOSIS — C321 Malignant neoplasm of supraglottis: Secondary | ICD-10-CM

## 2013-01-26 DIAGNOSIS — Z452 Encounter for adjustment and management of vascular access device: Secondary | ICD-10-CM

## 2013-01-26 DIAGNOSIS — C760 Malignant neoplasm of head, face and neck: Secondary | ICD-10-CM

## 2013-01-26 MED ORDER — SODIUM CHLORIDE 0.9 % IJ SOLN
10.0000 mL | INTRAMUSCULAR | Status: DC | PRN
  Administered 2013-01-26: 10 mL via INTRAVENOUS
  Filled 2013-01-26: qty 10

## 2013-01-26 MED ORDER — HEPARIN SOD (PORK) LOCK FLUSH 100 UNIT/ML IV SOLN
500.0000 [IU] | Freq: Once | INTRAVENOUS | Status: AC
  Administered 2013-01-26: 500 [IU] via INTRAVENOUS
  Filled 2013-01-26: qty 5

## 2013-01-26 NOTE — Patient Instructions (Signed)
Call MD for problems 

## 2013-02-10 ENCOUNTER — Other Ambulatory Visit: Payer: Self-pay | Admitting: *Deleted

## 2013-02-10 MED ORDER — CYCLOBENZAPRINE HCL 10 MG PO TABS: ORAL_TABLET | ORAL | Status: DC

## 2013-02-17 ENCOUNTER — Ambulatory Visit (HOSPITAL_BASED_OUTPATIENT_CLINIC_OR_DEPARTMENT_OTHER): Payer: Medicaid Other

## 2013-02-17 VITALS — BP 129/90 | HR 91 | Temp 98.2°F | Resp 18

## 2013-02-17 DIAGNOSIS — C321 Malignant neoplasm of supraglottis: Secondary | ICD-10-CM

## 2013-02-17 DIAGNOSIS — Z452 Encounter for adjustment and management of vascular access device: Secondary | ICD-10-CM

## 2013-02-17 MED ORDER — SODIUM CHLORIDE 0.9 % IJ SOLN
10.0000 mL | INTRAMUSCULAR | Status: DC | PRN
  Administered 2013-02-17: 10 mL via INTRAVENOUS
  Filled 2013-02-17: qty 10

## 2013-02-17 MED ORDER — HEPARIN SOD (PORK) LOCK FLUSH 100 UNIT/ML IV SOLN
500.0000 [IU] | Freq: Once | INTRAVENOUS | Status: AC
  Administered 2013-02-17: 500 [IU] via INTRAVENOUS
  Filled 2013-02-17: qty 5

## 2013-03-16 ENCOUNTER — Ambulatory Visit (HOSPITAL_BASED_OUTPATIENT_CLINIC_OR_DEPARTMENT_OTHER): Payer: Medicaid Other

## 2013-03-16 ENCOUNTER — Telehealth: Payer: Self-pay | Admitting: Oncology

## 2013-03-16 ENCOUNTER — Telehealth: Payer: Self-pay | Admitting: Hematology and Oncology

## 2013-03-16 VITALS — BP 118/77 | HR 79 | Temp 97.1°F | Resp 20

## 2013-03-16 DIAGNOSIS — Z452 Encounter for adjustment and management of vascular access device: Secondary | ICD-10-CM

## 2013-03-16 DIAGNOSIS — C321 Malignant neoplasm of supraglottis: Secondary | ICD-10-CM

## 2013-03-16 MED ORDER — SODIUM CHLORIDE 0.9 % IJ SOLN
10.0000 mL | INTRAMUSCULAR | Status: DC | PRN
  Administered 2013-03-16: 10 mL via INTRAVENOUS
  Filled 2013-03-16: qty 10

## 2013-03-16 MED ORDER — HEPARIN SOD (PORK) LOCK FLUSH 100 UNIT/ML IV SOLN
500.0000 [IU] | Freq: Once | INTRAVENOUS | Status: AC
  Administered 2013-03-16: 500 [IU] via INTRAVENOUS
  Filled 2013-03-16: qty 5

## 2013-03-16 NOTE — Telephone Encounter (Signed)
Pt stopped by scheduling today after flush appt and states she was instructed by the flush nurse to change flush appts from q4w to q6w. appts changed accordingly, feb appt moved from [facility] to [facility]. Pt given new schedule for November 2014 thru March 2015.

## 2013-03-16 NOTE — Telephone Encounter (Signed)
pt came in today for tomorrows appt...pt did not want to come back tomorrow...sched for today.Marland KitchenMarland Kitchen

## 2013-03-30 ENCOUNTER — Other Ambulatory Visit: Payer: Self-pay | Admitting: Family Medicine

## 2013-03-30 DIAGNOSIS — Z1231 Encounter for screening mammogram for malignant neoplasm of breast: Secondary | ICD-10-CM

## 2013-04-07 ENCOUNTER — Ambulatory Visit (INDEPENDENT_AMBULATORY_CARE_PROVIDER_SITE_OTHER): Payer: Medicaid Other | Admitting: Family Medicine

## 2013-04-07 ENCOUNTER — Encounter: Payer: Self-pay | Admitting: Family Medicine

## 2013-04-07 VITALS — BP 117/81 | HR 80 | Temp 98.2°F | Wt 187.0 lb

## 2013-04-07 DIAGNOSIS — G8929 Other chronic pain: Secondary | ICD-10-CM

## 2013-04-07 DIAGNOSIS — E039 Hypothyroidism, unspecified: Secondary | ICD-10-CM

## 2013-04-07 DIAGNOSIS — Z23 Encounter for immunization: Secondary | ICD-10-CM

## 2013-04-07 DIAGNOSIS — M25512 Pain in left shoulder: Secondary | ICD-10-CM | POA: Insufficient documentation

## 2013-04-07 DIAGNOSIS — M25511 Pain in right shoulder: Secondary | ICD-10-CM | POA: Insufficient documentation

## 2013-04-07 DIAGNOSIS — M25519 Pain in unspecified shoulder: Secondary | ICD-10-CM

## 2013-04-07 DIAGNOSIS — I10 Essential (primary) hypertension: Secondary | ICD-10-CM

## 2013-04-07 LAB — BASIC METABOLIC PANEL
BUN: 16 mg/dL (ref 6–23)
Chloride: 103 mEq/L (ref 96–112)
Creat: 1.04 mg/dL (ref 0.50–1.10)
Glucose, Bld: 64 mg/dL — ABNORMAL LOW (ref 70–99)

## 2013-04-07 MED ORDER — KETOROLAC TROMETHAMINE 60 MG/2ML IM SOLN
60.0000 mg | Freq: Once | INTRAMUSCULAR | Status: AC
  Administered 2013-04-07: 60 mg via INTRAMUSCULAR

## 2013-04-07 MED ORDER — DEXAMETHASONE SODIUM PHOSPHATE 10 MG/ML IJ SOLN
10.0000 mg | Freq: Once | INTRAMUSCULAR | Status: AC
  Administered 2013-04-07: 10 mg via INTRAMUSCULAR

## 2013-04-07 NOTE — Assessment & Plan Note (Signed)
A: chronic x one year. Acutely worsen w/o inciting injury P: decardon and toradol IM x one  Provided with exercise for rotator cuff injury although exam is not consistent with rotator cuff pathology will f/u response to exercise regimen at next visit.  No plan for narcotics or imaging at this time.

## 2013-04-07 NOTE — Assessment & Plan Note (Addendum)
A: BP at well Meds: compliant P: continue current regimen.  Repeat BMP today

## 2013-04-07 NOTE — Patient Instructions (Signed)
Deborah [name],  Thank you for coming in to see me today.  Your blood pressure is great!  Please perform exercises provided for shoulder pain reduction twice daily.  Continue current medication regimen.   I will be in touch with blood work results.   Schedule a f/u with me within the next 3 months for your pap smear.   Dr. Armen Pickup

## 2013-04-07 NOTE — Progress Notes (Signed)
  Subjective:    Patient ID: Deborah [name], female    DOB: 1955-12-11, 57 y.o.   MRN: [id number]  HPI 57 year old female presents for followup visit to discuss the following:  #1 bilateral shoulder pain: Patient with one-year intermittent shoulder pain bilaterally. Pain is worsened over the past 2 weeks. Pain is associated with spasms. It is exacerbated by lifting her arms overhead and quick movements. Pain is relieved with muscle relaxer. She denies recent injury or heavy lifting. She is not working. Pain has not improved with daily NSAID, tramadol or ice therapy.   #2 hypertension: Patient compliant with at the hypertensive. She denies chest pain with exertion, shortness of breath, looks to be edema. She admits that she did have an episode of chest pain at rest that was substernal burning lasting for a few minutes roughly one week ago.  #3 healthcare maintenance: Patient is due for flu shot she has agreed to flu shot today. Patient is also due for Pap smear she elected to do this at a  later date.  Soc hx reviewed: patient is an ex-smoker   Review of Systems  Constitutional: Negative.   Respiratory: Negative for shortness of breath.   Cardiovascular: Negative for chest pain, palpitations and leg swelling.  Musculoskeletal: Positive for myalgias.      Objective:   Physical Exam BP 117/81  Pulse 80  Temp(Src) 98.2 F (36.8 C) (Oral)  Wt 187 lb (84.823 kg)  BMI 29.28 kg/m2 Wt Readings from Last 3 Encounters:  04/07/13 187 lb (84.823 kg)  12/03/12 178 lb (80.74 kg)  11/25/12 181 lb 14.4 oz (82.509 kg)  General appearance: alert, cooperative and no distress Throat: abnormal findings: midline trach. no erythema or drainage from site  Lungs: clear to auscultation bilaterally Heart: regular rate and rhythm, S1, S2 normal, no murmur, click, rub or gallop Shoulder: Bilateral  Inspection reveals no abnormalities, atrophy or asymmetry. Palpation is normal with no tenderness over AC  joint or bicipital groove or sub acromion space.  ROM is decreased in abduction.  Rotator cuff strength normal,  5/5 throughout. No signs of impingement with negative Neer and Hawkin's tests,  + empty can. Speeds and Yergason's tests normal. No labral pathology noted with negative Obrien's, negative clunk and good stability. Normal scapular function observed. No painful arc and no drop arm sign. No apprehension sign    Assessment & Plan:

## 2013-04-07 NOTE — Assessment & Plan Note (Signed)
A: compliance with medication. No s/s of thyroid derangement P: Repeat TSH

## 2013-04-08 ENCOUNTER — Telehealth: Payer: Self-pay | Admitting: Family Medicine

## 2013-04-08 DIAGNOSIS — IMO0002 Reserved for concepts with insufficient information to code with codable children: Secondary | ICD-10-CM

## 2013-04-08 DIAGNOSIS — E039 Hypothyroidism, unspecified: Secondary | ICD-10-CM

## 2013-04-08 MED ORDER — ALBUTEROL SULFATE HFA 108 (90 BASE) MCG/ACT IN AERS: 1.0000 | INHALATION_SPRAY | RESPIRATORY_TRACT | Status: DC | PRN

## 2013-04-08 MED ORDER — LEVOTHYROXINE SODIUM 137 MCG PO TABS: 137.0000 ug | ORAL_TABLET | Freq: Every day | ORAL | Status: DC

## 2013-04-08 NOTE — Assessment & Plan Note (Signed)
TSH elevated Dose increased, dosing changed to night. Repeat TSH in 8 week.

## 2013-04-08 NOTE — Telephone Encounter (Signed)
Patient called. TSH elevated Patient taking synthroid q AM before breakfast. Plan to increase synthroid dose per orders Patient advise to take synthroid at night.

## 2013-04-19 ENCOUNTER — Ambulatory Visit (HOSPITAL_COMMUNITY)
Admission: RE | Admit: 2013-04-19 | Discharge: 2013-04-19 | Disposition: A | Discharge status: Home / Self Care | Payer: Medicaid Other | Source: Ambulatory Visit | Attending: Family Medicine | Admitting: Family Medicine

## 2013-04-19 DIAGNOSIS — Z1231 Encounter for screening mammogram for malignant neoplasm of breast: Secondary | ICD-10-CM | POA: Insufficient documentation

## 2013-04-27 ENCOUNTER — Ambulatory Visit (HOSPITAL_BASED_OUTPATIENT_CLINIC_OR_DEPARTMENT_OTHER): Payer: Medicaid Other

## 2013-04-27 VITALS — BP 124/72 | HR 88 | Temp 98.1°F | Resp 18

## 2013-04-27 DIAGNOSIS — C321 Malignant neoplasm of supraglottis: Secondary | ICD-10-CM

## 2013-04-27 DIAGNOSIS — Z452 Encounter for adjustment and management of vascular access device: Secondary | ICD-10-CM

## 2013-04-27 MED ORDER — HEPARIN SOD (PORK) LOCK FLUSH 100 UNIT/ML IV SOLN
500.0000 [IU] | Freq: Once | INTRAVENOUS | Status: AC
  Administered 2013-04-27: 500 [IU] via INTRAVENOUS
  Filled 2013-04-27: qty 5

## 2013-04-27 MED ORDER — SODIUM CHLORIDE 0.9 % IJ SOLN
10.0000 mL | Freq: Once | INTRAMUSCULAR | Status: AC
  Administered 2013-04-27: 10 mL via INTRAVENOUS
  Filled 2013-04-27: qty 10

## 2013-04-27 NOTE — Patient Instructions (Signed)

## 2013-06-20 ENCOUNTER — Other Ambulatory Visit: Payer: Self-pay | Admitting: Hematology and Oncology

## 2013-06-20 DIAGNOSIS — C321 Malignant neoplasm of supraglottis: Secondary | ICD-10-CM

## 2013-06-20 DIAGNOSIS — E039 Hypothyroidism, unspecified: Secondary | ICD-10-CM

## 2013-06-23 ENCOUNTER — Ambulatory Visit (INDEPENDENT_AMBULATORY_CARE_PROVIDER_SITE_OTHER): Payer: Medicaid Other | Admitting: Family Medicine

## 2013-06-23 ENCOUNTER — Encounter: Payer: Self-pay | Admitting: Family Medicine

## 2013-06-23 VITALS — BP 132/88 | HR 98 | Temp 98.9°F | Ht 67.0 in | Wt 197.0 lb

## 2013-06-23 DIAGNOSIS — R059 Cough, unspecified: Secondary | ICD-10-CM

## 2013-06-23 DIAGNOSIS — R05 Cough: Secondary | ICD-10-CM

## 2013-06-23 MED ORDER — DOXYCYCLINE HYCLATE 100 MG PO TABS: 100.0000 mg | ORAL_TABLET | Freq: Two times a day (BID) | ORAL | Status: DC

## 2013-06-23 MED ORDER — BENZONATATE 100 MG PO CAPS: 100.0000 mg | ORAL_CAPSULE | Freq: Two times a day (BID) | ORAL | Status: DC | PRN

## 2013-06-23 MED ORDER — GUAIFENESIN ER 600 MG PO TB12: 600.0000 mg | ORAL_TABLET | Freq: Two times a day (BID) | ORAL | Status: DC

## 2013-06-23 NOTE — Patient Instructions (Signed)
It has been a pleasure to see you today. Please take the medications as prescribed. If your symptoms do not resolve, worsen or other concerning symptoms appear please come back for re-evaluation. F/u with you primary doctor as scheduled.

## 2013-06-24 DIAGNOSIS — R05 Cough: Secondary | ICD-10-CM | POA: Insufficient documentation

## 2013-06-24 DIAGNOSIS — R059 Cough, unspecified: Secondary | ICD-10-CM | POA: Insufficient documentation

## 2013-06-24 NOTE — Assessment & Plan Note (Addendum)
Cough with sputum present for 3 weeks with worsening of general symptoms and exam positive for diminished breath sounds with normal o2 sats. Hemodynamically stable.  Will treat with abx and close f/u. More aggressive work up if no improvement or worsening. Mucinex and Tessalon pearls PRN. Discussed signs that should prompt re-evaluation. Pt agreeable with plan.

## 2013-06-24 NOTE — Progress Notes (Signed)
Family Medicine Office Visit Note   Subjective:   Patient ID: Deborah [name], female  DOB: March 26, 1956, 58 y.o.. MRN: [id number]   Pt that comes today complaining of respiratory symptoms for about 3 weeks. She has PMHx significant for CA of supraglottics s/p radiation and tracheostomy. She reports cough with greenish sputum that has increased lately. She denies fever but has felt chills and reports lack of appetite. Pt denies difficulty breathing. She states last year had similar presentation and ended up with antibiotic treatment for PNA, she reports feel the same as she felt at that time.   Review of Systems:  Pt denies, chest pain, palpitations, headaches, dizziness, numbness or weakness. No changes on urinary or BM habits. No unintentional weigh loss/gain.  Objective:   Physical Exam: Gen:  NAD HEENT: Moist mucous membranes. Tracheostomy at midline without erythema or drainage. CV: Regular rate and rhythm, no murmurs rubs or gallops. PULM: Decreased breath sounds to auscultation bilaterally. No wheezes/rales/rhonchi ABD: Soft, non tender, non distended, normal bowel sounds EXT: No edema Neuro: Alert and oriented x3. No focalization  Assessment & Plan:

## 2013-07-07 ENCOUNTER — Other Ambulatory Visit: Payer: Self-pay | Admitting: Family Medicine

## 2013-07-20 ENCOUNTER — Other Ambulatory Visit: Payer: Self-pay

## 2013-07-28 ENCOUNTER — Telehealth: Payer: Self-pay | Admitting: Hematology and Oncology

## 2013-07-28 ENCOUNTER — Encounter (INDEPENDENT_AMBULATORY_CARE_PROVIDER_SITE_OTHER): Payer: Self-pay

## 2013-07-28 ENCOUNTER — Other Ambulatory Visit: Payer: Self-pay

## 2013-07-28 ENCOUNTER — Other Ambulatory Visit: Payer: Self-pay | Admitting: Hematology and Oncology

## 2013-07-28 ENCOUNTER — Ambulatory Visit (HOSPITAL_BASED_OUTPATIENT_CLINIC_OR_DEPARTMENT_OTHER): Payer: Medicaid Other | Admitting: Hematology and Oncology

## 2013-07-28 ENCOUNTER — Telehealth: Payer: Self-pay | Admitting: *Deleted

## 2013-07-28 ENCOUNTER — Encounter: Payer: Self-pay | Admitting: Hematology and Oncology

## 2013-07-28 VITALS — BP 142/80 | HR 84 | Temp 98.8°F | Resp 19 | Ht 67.0 in | Wt 200.5 lb

## 2013-07-28 DIAGNOSIS — C321 Malignant neoplasm of supraglottis: Secondary | ICD-10-CM

## 2013-07-28 DIAGNOSIS — R110 Nausea: Secondary | ICD-10-CM

## 2013-07-28 DIAGNOSIS — E039 Hypothyroidism, unspecified: Secondary | ICD-10-CM

## 2013-07-28 DIAGNOSIS — M542 Cervicalgia: Secondary | ICD-10-CM

## 2013-07-28 HISTORY — DX: Nausea: R11.0

## 2013-07-28 MED ORDER — ONDANSETRON 4 MG PO TBDP: 4.0000 mg | ORAL_TABLET | Freq: Three times a day (TID) | ORAL | Status: DC | PRN

## 2013-07-28 MED ORDER — OXYCODONE-ACETAMINOPHEN 5-325 MG PO TABS: 1.0000 | ORAL_TABLET | Freq: Three times a day (TID) | ORAL | Status: DC | PRN

## 2013-07-28 NOTE — Progress Notes (Signed)
[place] OFFICE PROGRESS NOTE  Patient Care Team: Minerva Ends, MD as PCP - General (Family Medicine) [name], MD as Attending Physician (Otolaryngology) Marye Round, MD (Radiation Oncology) Heath Lark, MD as Consulting Physician (Hematology and Oncology)  DIAGNOSIS: Squamous cell carcinoma of the supraglottis  SUMMARY OF ONCOLOGIC HISTORY: This patient was diagnosed with laryngeal cancer in the past after self palpation of a lump. She subsequently underwent total laryngectomy and bilateral neck dissection on 07/31/2010. The patient have high risk features including extracapsular extension, perineural invasion and lymphovascular invasion. She received adjuvant chemotherapy with radiation therapy between 09/03/2010 to 10/01/2010. The patient could not tolerate chemotherapy well and it ended prematurely. She was receiving weekly cisplatin. Radiation therapy was completed by 10/16/2010 and she was placed on observation.  INTERVAL HISTORY: Deborah [name] 58 y.o. female returns for further followup. She does not use the electronic device that was prescribed for her. Each. She self manage her tracheostomy care. She has not returned to [facility] for wall. She has significant stiff neck and pain in her neck. She has severe neck fibrosis and have difficulties moving her neck. Previously she was seen by physical therapist but because of insurance issue she was not able to continue outpatient rehabilitation. She is requiring pain medicine for management of this. She does not have a primary care provider.  I have reviewed the past medical history, past surgical history, social history and family history with the patient and they are unchanged from previous note.  ALLERGIES:  has No Known Allergies.  MEDICATIONS:  Current Outpatient Prescriptions  Medication Sig Dispense Refill  . albuterol (PROVENTIL HFA;VENTOLIN HFA) 108 (90 BASE) MCG/ACT inhaler Inhale 1-2  puffs into the lungs every 4 (four) hours as needed for wheezing or shortness of breath.  1 Inhaler  11  . buPROPion (WELLBUTRIN XL) 150 MG 24 hr tablet Take 150 mg by mouth every morning.      . cyclobenzaprine (FLEXERIL) 10 MG tablet TAKE ONE TABLET BY MOUTH TWICE DAILY AS NEEDED FOR MUSCLE SPASMS  30 tablet  0  . levothyroxine (SYNTHROID, LEVOTHROID) 137 MCG tablet Take 1 tablet (137 mcg total) by mouth daily before breakfast.  30 tablet  1  . lisinopril (PRINIVIL,ZESTRIL) 20 MG tablet Take 20 mg by mouth every morning.      . loratadine (CLARITIN) 10 MG tablet Take 10 mg by mouth every morning.      . metoprolol tartrate (LOPRESSOR) 25 MG tablet Take 1 tablet (25 mg total) by mouth 2 (two) times daily.  180 tablet  1  . ondansetron (ZOFRAN ODT) 4 MG disintegrating tablet Take 1 tablet (4 mg total) by mouth every 8 (eight) hours as needed for nausea.  60 tablet  3  . benzonatate (TESSALON) 100 MG capsule Take 1 capsule (100 mg total) by mouth 2 (two) times daily as needed for cough.  20 capsule  0  . doxycycline (VIBRA-TABS) 100 MG tablet Take 1 tablet (100 mg total) by mouth 2 (two) times daily.  20 tablet  0  . guaiFENesin (MUCINEX) 600 MG 12 hr tablet Take 1 tablet (600 mg total) by mouth 2 (two) times daily.  60 tablet  0  . meloxicam (MOBIC) 15 MG tablet Take 1 tablet (15 mg total) by mouth daily.  30 tablet  0  . omeprazole (PRILOSEC) 40 MG capsule Take 1 capsule (40 mg total) by mouth every morning.  30 capsule  2  . oxyCODONE-acetaminophen (PERCOCET/ROXICET)  5-325 MG per tablet Take 1 tablet by mouth every 8 (eight) hours as needed for moderate pain or severe pain.  90 tablet  0  . promethazine (PHENERGAN) 25 MG tablet Take 1 tablet (25 mg total) by mouth every 8 (eight) hours as needed for nausea.  20 tablet  0  . traMADol (ULTRAM) 50 MG tablet Take 1 tablet (50 mg total) by mouth every 8 (eight) hours as needed for pain.  30 tablet  0   No current facility-administered medications for  this visit.    REVIEW OF SYSTEMS:   Constitutional: Denies fevers, chills or abnormal weight loss Eyes: Denies blurriness of vision Ears, nose, mouth, throat, and face: Denies mucositis or sore throat Respiratory: Denies cough, dyspnea or wheezes Cardiovascular: Denies palpitation, chest discomfort or lower extremity swelling Gastrointestinal:  Denies nausea, heartburn or change in bowel habits Skin: Denies abnormal skin rashes Lymphatics: Denies new lymphadenopathy or easy bruising Neurological:Denies numbness, tingling or new weaknesses Behavioral/Psych: Mood is stable, no new changes  All other systems were reviewed with the patient and are negative.  PHYSICAL EXAMINATION: ECOG PERFORMANCE STATUS: 1 - Symptomatic but completely ambulatory  Filed Vitals:   07/28/13 0834  BP: 142/80  Pulse: 84  Temp: 98.8 F (37.1 C)  Resp: 19   Filed Weights   07/28/13 0834  Weight: 200 lb 8 oz (90.946 kg)    GENERAL:alert, no distress and comfortable SKIN: skin color, texture, turgor are normal, no rashes or significant lesions EYES: normal, Conjunctiva are pink and non-injected, sclera clear OROPHARYNX:no exudate, no erythema and lips, buccal mucosa, and tongue normal  NECK: She had tracheostomy in situ. The neck is thick with significant fibrosis LYMPH:  no palpable lymphadenopathy in the cervical, axillary or inguinal LUNGS: clear to auscultation and percussion with normal breathing effort HEART: regular rate & rhythm and no murmurs and no lower extremity edema ABDOMEN:abdomen soft, non-tender and normal bowel sounds Musculoskeletal:no cyanosis of digits and no clubbing  NEURO: alert & oriented x 3 with fluent speech, no focal motor/sensory deficits  LABORATORY DATA:  I have reviewed the data as listed    Component Value Date/Time   NA 139 04/07/2013 0923   NA 143 11/23/2012 0807   NA 143 01/20/2012 0939   K 4.5 04/07/2013 0923   K 3.2* 11/23/2012 0807   K 4.3 01/20/2012 0939    CL 103 04/07/2013 0923   CL 105 11/23/2012 0807   CL 100 01/20/2012 0939   CO2 28 04/07/2013 0923   CO2 28 11/23/2012 0807   CO2 27 01/20/2012 0939   GLUCOSE 64* 04/07/2013 0923   GLUCOSE 83 11/23/2012 0807   GLUCOSE 97 01/20/2012 0939   BUN 16 04/07/2013 0923   BUN 10.1 11/23/2012 0807   BUN 18 01/20/2012 0939   CREATININE 1.04 04/07/2013 0923   CREATININE 1.0 11/23/2012 0807   CREATININE 1.05 11/11/2012 1530   CALCIUM 9.6 04/07/2013 0923   CALCIUM 9.2 11/23/2012 0807   CALCIUM 9.6 01/20/2012 0939   PROT 8.4* 11/23/2012 0807   PROT 8.2 08/02/2012 2014   PROT 8.7* 01/20/2012 0939   ALBUMIN 3.5 11/23/2012 0807   ALBUMIN 3.3* 08/02/2012 2014   AST 10 11/23/2012 0807   AST 35 08/02/2012 2014   AST 20 01/20/2012 0939   ALT 9 11/23/2012 0807   ALT 27 08/02/2012 2014   ALT 16 01/20/2012 0939   ALKPHOS 115 11/23/2012 0807   ALKPHOS 109 08/02/2012 2014   ALKPHOS 84 01/20/2012 0939  BILITOT 0.36 11/23/2012 0807   BILITOT 0.2* 08/02/2012 2014   BILITOT 0.70 01/20/2012 0939   GFRNONAA 59* 11/11/2012 1530   GFRAA 68* 11/11/2012 1530    No results found for this basename: SPEP, UPEP,  kappa and lambda light chains    Lab Results  Component Value Date   WBC 5.9 11/23/2012   NEUTROABS 3.5 11/23/2012   HGB 11.4* 11/23/2012   HCT 34.0* 11/23/2012   MCV 86.4 11/23/2012   PLT 295 11/23/2012      Chemistry      Component Value Date/Time   NA 139 04/07/2013 0923   NA 143 11/23/2012 0807   NA 143 01/20/2012 0939   K 4.5 04/07/2013 0923   K 3.2* 11/23/2012 0807   K 4.3 01/20/2012 0939   CL 103 04/07/2013 0923   CL 105 11/23/2012 0807   CL 100 01/20/2012 0939   CO2 28 04/07/2013 0923   CO2 28 11/23/2012 0807   CO2 27 01/20/2012 0939   BUN 16 04/07/2013 0923   BUN 10.1 11/23/2012 0807   BUN 18 01/20/2012 0939   CREATININE 1.04 04/07/2013 0923   CREATININE 1.0 11/23/2012 0807   CREATININE 1.05 11/11/2012 1530      Component Value Date/Time   CALCIUM 9.6 04/07/2013 0923   CALCIUM 9.2 11/23/2012 0807   CALCIUM 9.6 01/20/2012 0939    ALKPHOS 115 11/23/2012 0807   ALKPHOS 109 08/02/2012 2014   ALKPHOS 84 01/20/2012 0939   AST 10 11/23/2012 0807   AST 35 08/02/2012 2014   AST 20 01/20/2012 0939   ALT 9 11/23/2012 0807   ALT 27 08/02/2012 2014   ALT 16 01/20/2012 0939   BILITOT 0.36 11/23/2012 0807   BILITOT 0.2* 08/02/2012 2014   BILITOT 0.70 01/20/2012 0939     ASSESSMENT & PLAN:  #1 supraglottic tumor The patient is almost 3 years out from her disease. There is no benefit of periodic imaging study. I recommend she continue followup with ENT for periodic laryngoscopic evaluation. I will see her on a yearly basis #2 hypothyroidism The patient has symptoms of significant weight gain. Unfortunately her thyroid function tests has not been rechecked. I recommend she increase the Synthroid to 1-1/2 tablets and I will make her a lab appointment to have her blood work rechecked in 3 months. #3 chronic neck pain  This is related to radiation and surgical complications with treatment-related fibrosis. I recommend physical therapy but the patient thought it was not helpful. I do not want to be prescribing pain medicine for the patient long-term. I told her I would give her one prescription to take pain medicine as needed but I will not refill her medications in the future and she needs to discuss with her primary care provider for chronic pain management #4 port accessed We will get the port removed Orders Placed This Encounter  Procedures  . Comprehensive metabolic panel    Standing Status: Future     Number of Occurrences:      Standing Expiration Date: 07/28/2014  . CBC with Differential    Standing Status: Future     Number of Occurrences:      Standing Expiration Date: 07/28/2014  . T4, free    Standing Status: Future     Number of Occurrences:      Standing Expiration Date: 07/28/2014  . TSH    Standing Status: Future     Number of Occurrences:      Standing Expiration Date: 07/28/2014  All questions were answered. The  patient knows to call the clinic with any problems, questions or concerns. No barriers to learning was detected. I spent 40 minutes counseling the patient face to face. The total time spent in the appointment was 60 minutes and more than 50% was on counseling and review of test results     [facility], [place], MD 07/28/2013 3:02 PM

## 2013-07-28 NOTE — Telephone Encounter (Signed)
Instructed patient to take synthroid 1 and 1/2 tablets every other day per Dr Alvy Bimler

## 2013-07-28 NOTE — Telephone Encounter (Signed)
s.w. pt and advised on IR port removal appt....pt already aware

## 2013-07-28 NOTE — Telephone Encounter (Signed)
gv and printed appt scehd and avs forpt for Feb 2016 also printed March adn June...pt did not want to sched any more flushes

## 2013-07-29 ENCOUNTER — Telehealth: Payer: Self-pay | Admitting: Hematology and Oncology

## 2013-07-29 NOTE — Telephone Encounter (Signed)
s.w pt and advised on May lab....pt ok and aware

## 2013-08-02 ENCOUNTER — Other Ambulatory Visit: Payer: Self-pay | Admitting: Radiology

## 2013-08-04 ENCOUNTER — Ambulatory Visit (HOSPITAL_COMMUNITY)
Admission: RE | Admit: 2013-08-04 | Discharge: 2013-08-04 | Disposition: A | Discharge status: Home / Self Care | Payer: Medicaid Other | Source: Ambulatory Visit | Attending: Hematology and Oncology | Admitting: Hematology and Oncology

## 2013-08-04 ENCOUNTER — Encounter (HOSPITAL_COMMUNITY): Payer: Self-pay

## 2013-08-04 DIAGNOSIS — Z79899 Other long term (current) drug therapy: Secondary | ICD-10-CM | POA: Insufficient documentation

## 2013-08-04 DIAGNOSIS — Z930 Tracheostomy status: Secondary | ICD-10-CM | POA: Insufficient documentation

## 2013-08-04 DIAGNOSIS — C321 Malignant neoplasm of supraglottis: Secondary | ICD-10-CM

## 2013-08-04 DIAGNOSIS — Z9089 Acquired absence of other organs: Secondary | ICD-10-CM | POA: Insufficient documentation

## 2013-08-04 DIAGNOSIS — Z9221 Personal history of antineoplastic chemotherapy: Secondary | ICD-10-CM | POA: Insufficient documentation

## 2013-08-04 DIAGNOSIS — J45909 Unspecified asthma, uncomplicated: Secondary | ICD-10-CM | POA: Insufficient documentation

## 2013-08-04 DIAGNOSIS — Z87891 Personal history of nicotine dependence: Secondary | ICD-10-CM | POA: Insufficient documentation

## 2013-08-04 DIAGNOSIS — Z452 Encounter for adjustment and management of vascular access device: Secondary | ICD-10-CM | POA: Insufficient documentation

## 2013-08-04 DIAGNOSIS — I10 Essential (primary) hypertension: Secondary | ICD-10-CM | POA: Insufficient documentation

## 2013-08-04 DIAGNOSIS — K219 Gastro-esophageal reflux disease without esophagitis: Secondary | ICD-10-CM | POA: Insufficient documentation

## 2013-08-04 DIAGNOSIS — E038 Other specified hypothyroidism: Secondary | ICD-10-CM | POA: Insufficient documentation

## 2013-08-04 DIAGNOSIS — Y842 Radiological procedure and radiotherapy as the cause of abnormal reaction of the patient, or of later complication, without mention of misadventure at the time of the procedure: Secondary | ICD-10-CM | POA: Insufficient documentation

## 2013-08-04 DIAGNOSIS — E039 Hypothyroidism, unspecified: Secondary | ICD-10-CM

## 2013-08-04 LAB — CBC WITH DIFFERENTIAL/PLATELET
Basophils Absolute: 0 10*3/uL (ref 0.0–0.1)
Basophils Relative: 1 % (ref 0–1)
Eosinophils Absolute: 0.3 10*3/uL (ref 0.0–0.7)
Eosinophils Relative: 7 % — ABNORMAL HIGH (ref 0–5)
HEMATOCRIT: 35.4 % — AB (ref 36.0–46.0)
Hemoglobin: 11.5 g/dL — ABNORMAL LOW (ref 12.0–15.0)
Lymphocytes Relative: 35 % (ref 12–46)
Lymphs Abs: 1.3 10*3/uL (ref 0.7–4.0)
MCH: 29.2 pg (ref 26.0–34.0)
MCHC: 32.5 g/dL (ref 30.0–36.0)
MCV: 89.8 fL (ref 78.0–100.0)
MONO ABS: 0.3 10*3/uL (ref 0.1–1.0)
Monocytes Relative: 7 % (ref 3–12)
Neutro Abs: 1.9 10*3/uL (ref 1.7–7.7)
Neutrophils Relative %: 50 % (ref 43–77)
Platelets: 287 10*3/uL (ref 150–400)
RBC: 3.94 MIL/uL (ref 3.87–5.11)
RDW: 12.3 % (ref 11.5–15.5)
WBC: 3.8 10*3/uL — ABNORMAL LOW (ref 4.0–10.5)

## 2013-08-04 LAB — PROTIME-INR
INR: 0.97 (ref 0.00–1.49)
Prothrombin Time: 12.7 seconds (ref 11.6–15.2)

## 2013-08-04 MED ORDER — LIDOCAINE HCL 1 % IJ SOLN
INTRAMUSCULAR | Status: AC
  Filled 2013-08-04: qty 20

## 2013-08-04 MED ORDER — CEFAZOLIN SODIUM-DEXTROSE 2-3 GM-% IV SOLR
2.0000 g | Freq: Once | INTRAVENOUS | Status: AC
  Administered 2013-08-04: 2 g via INTRAVENOUS
  Filled 2013-08-04: qty 50

## 2013-08-04 MED ORDER — FENTANYL CITRATE 0.05 MG/ML IJ SOLN
INTRAMUSCULAR | Status: AC | PRN
  Administered 2013-08-04: 25 ug via INTRAVENOUS

## 2013-08-04 MED ORDER — MIDAZOLAM HCL 2 MG/2ML IJ SOLN
INTRAMUSCULAR | Status: AC
  Filled 2013-08-04: qty 6

## 2013-08-04 MED ORDER — SODIUM CHLORIDE 0.9 % IV SOLN
INTRAVENOUS | Status: DC
  Administered 2013-08-04: 11:00:00 via INTRAVENOUS

## 2013-08-04 MED ORDER — FENTANYL CITRATE 0.05 MG/ML IJ SOLN
INTRAMUSCULAR | Status: AC
  Filled 2013-08-04: qty 6

## 2013-08-04 MED ORDER — MIDAZOLAM HCL 2 MG/2ML IJ SOLN
INTRAMUSCULAR | Status: AC | PRN
  Administered 2013-08-04: 0.5 mg via INTRAVENOUS
  Administered 2013-08-04: 1 mg via INTRAVENOUS

## 2013-08-04 NOTE — Procedures (Signed)
Procedure:  Port removal Findings:  Right chest port removed in entirety utilizing sharp and blunt dissection.

## 2013-08-04 NOTE — H&P (Signed)
Agree.  For port removal today. 

## 2013-08-04 NOTE — H&P (Signed)
Chief Complaint: "I am here to get my port removed." Referring Physician: Dr. Alvy Bimler HPI: Deborah [name] is an 58 y.o. female with PMHx of squamous cell carcinoma of the supraglottis and has finished her treatment in 2012. Request received for port a catheter removal. She denies any chest pain or shortness of breath. She denies any active bleeding or taking any blood thinners. She denies any fever or chills. She denies any difficulty with previous sedation.  Past Medical History:  Past Medical History  Diagnosis Date  . Hypertension   . Tracheostomy dependent   . History of laryngectomy   . Bronchitis   . 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  [name] Heart murmur     asymptomatic   . Leukocytopenia   . Asthma   . Sinusitis, chronic 07/20/2011  . Normal MRI 07/14/11    negative for mestasis   . Hx of radiation therapy 09/03/10 to 10/16/2010    supraglottic larynx  . 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 Neck pain 01/21/2012  . GERD (gastroesophageal reflux disease)   . Sepsis 08/04/12  . Seizures     07/24/11 off Effexor w/o seizure  . Anemia   . Sinusitis, chronic 07/20/2011    Bilateral maxillary, identified on MRI of head 07/14/11.    . Diverticulosis 11/15/2012     noted on screening colonoscopy   . Internal hemorrhoid 11/15/2012     small, noted on screening colonoscopy   . Nausea alone 07/28/2013    Past Surgical History:  Past Surgical History  Procedure Laterality Date  . Portacath placement  09/17/10    Tip in cavoatrial junction  . Laryngectomy    . Tracheal dilitation  07/16/2011    Procedure: TRACHEAL DILITATION;  Surgeon: Beckie Salts, MD;  Location: [facility];  Service: ENT;  Laterality: N/A;  dilation of tracheal stoma and replacement of stoma tube  . Foreign body removal bronchial  10/02/2011    Procedure: REMOVAL FOREIGN BODY BRONCHIAL;  Surgeon: Ruby Cola, MD;  Location: [facility];  Service: ENT;  Laterality: N/A;  . Esophagoscopy   06/21/2012    Procedure: ESOPHAGOSCOPY;  Surgeon: [name], MD;  Location: [facility];  Service: ENT;  Laterality: N/A;  . Tubal ligation  1982  . Colonoscopy N/A 11/15/2012    Procedure: COLONOSCOPY;  Surgeon: Lafayette Dragon, MD;  Location: [facility];  Service: Endoscopy;  Laterality: N/A;    Family History:  Family History  Problem Relation Age of Onset  . Heart disease Mother   . Heart disease Father   . Heart disease Sister     Social History:  reports that she quit smoking about 2 years ago. Her smoking use included Cigarettes. She has a 40 pack-year smoking history. She has never used smokeless tobacco. She reports that she uses illicit drugs (Cocaine). She reports that she does not drink alcohol.  Allergies: No Known Allergies  Medications:   Medication List    ASK your doctor about these medications       albuterol 108 (90 BASE) MCG/ACT inhaler  Commonly known as:  PROVENTIL HFA;VENTOLIN HFA  Inhale 1-2 puffs into the lungs every 4 (four) hours as needed for wheezing or shortness of breath.     buPROPion 150 MG 24 hr tablet  Commonly known as:  WELLBUTRIN XL  Take 150 mg by mouth every morning.     cyclobenzaprine 10 MG tablet  Commonly  known as:  FLEXERIL  Take 10 mg by mouth 3 (three) times daily as needed for muscle spasms.     levothyroxine 137 MCG tablet  Commonly known as:  SYNTHROID, LEVOTHROID  Take 205.5 mcg by mouth every other day.     lisinopril 20 MG tablet  Commonly known as:  PRINIVIL,ZESTRIL  Take 20 mg by mouth every morning.     loratadine 10 MG tablet  Commonly known as:  CLARITIN  Take 10 mg by mouth every morning.     metoprolol tartrate 25 MG tablet  Commonly known as:  LOPRESSOR  Take 1 tablet (25 mg total) by mouth 2 (two) times daily.     ondansetron 4 MG disintegrating tablet  Commonly known as:  ZOFRAN-ODT  Take 4 mg by mouth every 8 (eight) hours as needed for nausea.     oxyCODONE-acetaminophen 5-325 MG per  tablet  Commonly known as:  PERCOCET/ROXICET  Take 1 tablet by mouth every 8 (eight) hours as needed for moderate pain or severe pain.       Please HPI for pertinent positives, otherwise complete 10 system ROS negative.  Physical Exam: BP 164/90  Pulse 83  Temp(Src) 97.9 F (36.6 C) (Oral)  Resp 20  SpO2 96% There is no weight on file to calculate BMI.  General Appearance:  Alert, cooperative, no distress  Head:  Normocephalic, without obvious abnormality, atraumatic  Neck: Supple, symmetrical, tracheostomy intact  Lungs:   Clear to auscultation bilaterally, no w/r/r, respirations unlabored without use of accessory muscles.  Chest Wall:  No tenderness. Right sided port intact, well healed, no signs of infection.   Heart:  Regular rate and rhythm, S1, S2 normal, no murmur, rub or gallop.  Abdomen:   Soft, non-tender, non distended, (+) BS  Extremities: Extremities normal, atraumatic, no cyanosis or edema  Pulses: 1+ and symmetric  Neurologic: Normal affect, no gross deficits.   Recent Results (from the past 2160 hour(s))  CBC WITH DIFFERENTIAL     Status: Abnormal   Collection Time    08/04/13 11:20 AM      Result Value Ref Range   WBC 3.8 (*) 4.0 - 10.5 K/uL   RBC 3.94  3.87 - 5.11 MIL/uL   Hemoglobin 11.5 (*) 12.0 - 15.0 g/dL   HCT 35.4 (*) 36.0 - 46.0 %   MCV 89.8  78.0 - 100.0 fL   MCH 29.2  26.0 - 34.0 pg   MCHC 32.5  30.0 - 36.0 g/dL   RDW 12.3  11.5 - 15.5 %   Platelets 287  150 - 400 K/uL   Neutrophils Relative % 50  43 - 77 %   Neutro Abs 1.9  1.7 - 7.7 K/uL   Lymphocytes Relative 35  12 - 46 %   Lymphs Abs 1.3  0.7 - 4.0 K/uL   Monocytes Relative 7  3 - 12 %   Monocytes Absolute 0.3  0.1 - 1.0 K/uL   Eosinophils Relative 7 (*) 0 - 5 %   Eosinophils Absolute 0.3  0.0 - 0.7 K/uL   Basophils Relative 1  0 - 1 %   Basophils Absolute 0.0  0.0 - 0.1 K/uL  PROTIME-INR     Status: None   Collection Time    08/04/13 11:20 AM      Result Value Ref Range    Prothrombin Time 12.7  11.6 - 15.2 seconds   INR 0.97  0.00 - 1.49    Assessment/Plan Squamous cell carcinoma  of the supraglottis s/p total laryngectomy and bilateral neck dissection 2012, s/p tracheostomy.  Chemotherapy and radiation completed, request for port a catheter removal. Patient has been NPO, labs reviewed, no blood thinners, ancef ordered. Risks and Benefits discussed with the patient. All of the patient's questions were answered, patient is agreeable to proceed. Consent signed and in chart. Hypothyroidism, on synthroid.   Tsosie Billing D PA-C 08/04/2013, 11:53 AM

## 2013-08-04 NOTE — Discharge Instructions (Signed)
Incision Care °An incision is when a surgeon cuts into your body tissues. After surgery, the incision needs to be cared for properly to prevent infection.  °HOME CARE INSTRUCTIONS  °· Take all medicine as directed by your caregiver. Only take over-the-counter or prescription medicines for pain, discomfort, or fever as directed by your caregiver. °· Do not remove your bandage (dressing) or get your incision wet until your surgeon gives you permission. In the event that your dressing becomes wet, dirty, or starts to smell, change the dressing and call your surgeon for instructions as soon as possible. °· Take showers. Do not take tub baths, swim, or do anything that may soak the wound until it is healed. °· Resume your normal diet and activities as directed or allowed. °· Avoid lifting any weight until you are instructed otherwise. °· Use anti-itch antihistamine medicine as directed by your caregiver. The wound may itch when it is healing. Do not pick or scratch at the wound. °· Follow up with your caregiver for stitch (suture) or staple removal as directed. °· Drink enough fluids to keep your urine clear or pale yellow. °SEEK MEDICAL CARE IF:  °· You have redness, swelling, or increasing pain in the wound that is not controlled with medicine. °· You have drainage, blood, or pus coming from the wound that lasts longer than 1 day. °· You develop muscle aches, chills, or a general ill feeling. °· You notice a bad smell coming from the wound or dressing. °· Your wound edges separate after the sutures, staples, or skin adhesive strips have been removed. °· You develop persistent nausea or vomiting. °SEEK IMMEDIATE MEDICAL CARE IF:  °· You have a fever. °· You develop a rash. °· You develop dizzy episodes or faint while standing. °· You have difficulty breathing. °· You develop any reaction or side effects to medicine given. °MAKE SURE YOU:  °· Understand these instructions. °· Will watch your condition. °· Will get help  right away if you are not doing well or get worse. °Document Released: 12/20/2004 Document Revised: 08/25/2011 Document Reviewed: 10/06/2010 °Exit[employer]® Patient Information ©2014 [employer]. °Moderate Sedation, Adult °Moderate sedation is given to help you relax or even sleep through a procedure. You may remain sleepy, be clumsy, or have poor balance for several hours following this procedure. Arrange for a responsible adult, family member, or friend to take you home. A responsible adult should stay with you for at least 24 hours or until the medicines have worn off. °· Do not participate in any activities where you could become injured for the next 24 hours, or until you feel normal again. Do not: °· Drive. °· Swim. °· Ride a bicycle. °· Operate heavy machinery. °· Cook. °· Use power tools. °· Climb ladders. °· Work at heights. °· Do not make important decisions or sign legal documents until you are improved. °· Vomiting may occur if you eat too soon. When you can drink without vomiting, try water, juice, or soup. Try solid foods if you feel little or no nausea. °· Only take over-the-counter or prescription medications for pain, discomfort, or fever as directed by your caregiver.If pain medications have been prescribed for you, ask your caregiver how soon it is safe to take them. °· Make sure you and your family fully understands everything about the medication given to you. Make sure you understand what side effects may occur. °· You should not drink alcohol, take sleeping pills, or medications that cause drowsiness for at least   24 hours. °· If you smoke, do not smoke alone. °· If you are feeling better, you may resume normal activities 24 hours after receiving sedation. °· Keep all appointments as scheduled. Follow all instructions. °· Ask questions if you do not understand. °SEEK MEDICAL CARE IF:  °· Your skin is pale or bluish in color. °· You continue to feel sick to your stomach (nauseous) or throw up  (vomit). °· Your pain is getting worse and not helped by medication. °· You have bleeding or swelling. °· You are still sleepy or feeling clumsy after 24 hours. °SEEK IMMEDIATE MEDICAL CARE IF:  °· You develop a rash. °· You have difficulty breathing. °· You develop any type of allergic problem. °· You have a fever. °Document Released: 02/25/2001 Document Revised: 08/25/2011 Document Reviewed: 02/07/2013 °ExitCare® Patient Information ©2014 [employer]. ° °

## 2013-08-08 ENCOUNTER — Other Ambulatory Visit: Payer: Self-pay | Admitting: Family Medicine

## 2013-08-08 ENCOUNTER — Other Ambulatory Visit: Payer: Self-pay | Admitting: *Deleted

## 2013-08-08 MED ORDER — BUPROPION HCL ER (XL) 150 MG PO TB24: 150.0000 mg | ORAL_TABLET | Freq: Every day | ORAL | Status: DC

## 2013-10-08 ENCOUNTER — Other Ambulatory Visit: Payer: Self-pay | Admitting: Family Medicine

## 2013-10-13 ENCOUNTER — Ambulatory Visit (INDEPENDENT_AMBULATORY_CARE_PROVIDER_SITE_OTHER): Payer: Medicaid Other | Admitting: Sports Medicine

## 2013-10-13 ENCOUNTER — Ambulatory Visit
Admission: RE | Admit: 2013-10-13 | Discharge: 2013-10-13 | Disposition: A | Discharge status: Home / Self Care | Payer: Medicaid Other | Source: Ambulatory Visit | Attending: Family Medicine | Admitting: Family Medicine

## 2013-10-13 VITALS — BP 160/102 | HR 91 | Temp 98.8°F | Ht 67.0 in | Wt 206.7 lb

## 2013-10-13 DIAGNOSIS — Z90710 Acquired absence of both cervix and uterus: Secondary | ICD-10-CM

## 2013-10-13 DIAGNOSIS — R059 Cough, unspecified: Secondary | ICD-10-CM

## 2013-10-13 DIAGNOSIS — R05 Cough: Secondary | ICD-10-CM

## 2013-10-13 DIAGNOSIS — C321 Malignant neoplasm of supraglottis: Secondary | ICD-10-CM

## 2013-10-13 DIAGNOSIS — E039 Hypothyroidism, unspecified: Secondary | ICD-10-CM

## 2013-10-13 LAB — COMPREHENSIVE METABOLIC PANEL
ALT: 14 U/L (ref 0–35)
AST: 18 U/L (ref 0–37)
Albumin: 4.3 g/dL (ref 3.5–5.2)
Alkaline Phosphatase: 91 U/L (ref 39–117)
BUN: 17 mg/dL (ref 6–23)
CO2: 26 mEq/L (ref 19–32)
Calcium: 9.1 mg/dL (ref 8.4–10.5)
Chloride: 105 mEq/L (ref 96–112)
Creat: 0.97 mg/dL (ref 0.50–1.10)
Glucose, Bld: 87 mg/dL (ref 70–99)
Potassium: 4.3 mEq/L (ref 3.5–5.3)
Sodium: 139 mEq/L (ref 135–145)
Total Bilirubin: 0.2 mg/dL (ref 0.2–1.2)
Total Protein: 8.2 g/dL (ref 6.0–8.3)

## 2013-10-13 LAB — CBC WITH DIFFERENTIAL/PLATELET
Basophils Absolute: 0 10*3/uL (ref 0.0–0.1)
Basophils Relative: 1 % (ref 0–1)
EOS ABS: 0.3 10*3/uL (ref 0.0–0.7)
Eosinophils Relative: 7 % — ABNORMAL HIGH (ref 0–5)
HCT: 34.1 % — ABNORMAL LOW (ref 36.0–46.0)
Hemoglobin: 11.5 g/dL — ABNORMAL LOW (ref 12.0–15.0)
Lymphocytes Relative: 32 % (ref 12–46)
Lymphs Abs: 1.5 10*3/uL (ref 0.7–4.0)
MCH: 29.5 pg (ref 26.0–34.0)
MCHC: 33.7 g/dL (ref 30.0–36.0)
MCV: 87.4 fL (ref 78.0–100.0)
Monocytes Absolute: 0.5 10*3/uL (ref 0.1–1.0)
Monocytes Relative: 10 % (ref 3–12)
Neutro Abs: 2.4 10*3/uL (ref 1.7–7.7)
Neutrophils Relative %: 50 % (ref 43–77)
PLATELETS: 339 10*3/uL (ref 150–400)
RBC: 3.9 MIL/uL (ref 3.87–5.11)
RDW: 14.1 % (ref 11.5–15.5)
WBC: 4.8 10*3/uL (ref 4.0–10.5)

## 2013-10-13 LAB — TSH: TSH: 7.932 u[IU]/mL — ABNORMAL HIGH (ref 0.350–4.500)

## 2013-10-13 LAB — T4, FREE: Free T4: 1.2 ng/dL (ref 0.80–1.80)

## 2013-10-13 MED ORDER — BENZONATATE 200 MG PO CAPS: 200.0000 mg | ORAL_CAPSULE | Freq: Three times a day (TID) | ORAL | Status: DC | PRN

## 2013-10-13 MED ORDER — LEVOFLOXACIN 500 MG PO TABS: 500.0000 mg | ORAL_TABLET | Freq: Every day | ORAL | Status: DC

## 2013-10-13 NOTE — Assessment & Plan Note (Signed)
Acute episode of recurrent condition - Likely reflective of URI that has progressed over a 2 week course.  Given her history and significant coughing spells although no clinical evidence for pneumonia or pneumothorax chest x-ray and treatment with antibiotics is warranted. 1. Chest x-ray, labs 2. Levaquin 3. Tessalon and symptomatic treatment per AVS  > Follow up if not improving or worsening respiratory distress consider referral back to ENT for reevaluation of tracheostomy however patient reports this is significantly different than when she had to have her tracheostomy dilated  .

## 2013-10-13 NOTE — Patient Instructions (Signed)
Go get a chest X-ray Start Antibiotic Keep taking Mucinex and I will send in Tessalon.   Please continue to drink plenty of fluids and remember you and everybody in your household need to wash their hands frequently!  Take acetaminophen (Tylenol) 1X500mg  or 2X325mg  tablets every 8 hours  Take Ibuprofen (Advil or Motrin) 2-3 200mg  tablets every 8 hours  Try to alternate the acetaminophen and ibuprofen so that you take one every 4 hours Honey has been shown to help with cough.  You can try mixing  a teaspoon of honey in warm water or caffeine free herbal tea before bedtime. We don't know why, but chicken soup also helps, try it!

## 2013-10-13 NOTE — Progress Notes (Signed)
Deborah [name] - 58 y.o. female MRN [id number]  Date of birth: 1955/08/26  CC: URI and Cough   SUBJECTIVE:     HPI Comments: Patient presents with:   URI - OTC meds not helping;    Cough - 2.5 wks  Patient reports a gradual onset of cough that has worsened over the past 7 days to the point of having mild hemoptysis.  She has a very complicated history given her head and neck cancer and status post tracheostomy.  She denies any significant respiratory distress.  She reports fevers and chills with occasional rigors over the past 7 days.  She has tried taking Mucinex, NyQuil, ibuprofen without significant relief.  Patient denies any significant exertional chest pain does have pleuritic type chest pain with coughing.  No dyspnea on exertion or orthopnea.  No lower extremity swelling or edema.  Small amount of weight gain over the past 4 months.  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  See problem based charting for additional problem specific subjective (including HPI, Interval History & ROS)   HISTORY: Former smoker, history of head and neck cancer status post treatment for squamous cell carcinoma.  Some chronic pain at the stoma site as well as chronic bilateral shoulder pain.  History of seizure disorder. Otherwise past Medical, Surgical, Social, and Family History Reviewed per EMR Medications and Allergies reviewed and updated per below.  OBJECTIVE: VITALS: BP: 160/102 mmHg  HR: 91 bpm  TEMP: 98.8 F (37.1 C) (Oral)  RESP:    HT: 5\' 7"  (170.2 cm)  WT: 206 lb 11.2 oz (93.759 kg)  BMI: 32.4   BP Readings from Last 3 Encounters:  10/13/13 160/102  08/04/13 157/94  07/28/13 142/80   Wt Readings from Last 3 Encounters:  10/13/13 206 lb 11.2 oz (93.759 kg)  07/28/13 200 lb 8 oz (90.946 kg)  06/23/13 197 lb (89.359 kg)     Physical Exam  Vitals reviewed. Constitutional:    Well-developed, well-nourished, mild distress with coughingbut recovers fully between spells  HENT:  Head: Normocephalic and atraumatic.  Right Ear: External ear normal.  Left Ear: External ear normal.  Eyes: Conjunctivae and EOM are normal. Right eye exhibits no discharge. Left eye exhibits no discharge.  Neck: Normal range of motion. Neck supple.  Tracheostomy in place  Cardiovascular: Normal rate, regular rhythm and normal heart sounds.  Exam reveals no gallop and no friction rub.   No murmur heard. Pulmonary/Chest: Effort normal. No respiratory distress (except with coughing). She has no wheezes. She has no rales. She exhibits no tenderness.  Breath sounds heard throughout all lung fields.  No stridor noted with normal respiration  Abdominal: Soft.  Musculoskeletal: She exhibits no edema.  Neurological: She is alert.  Moves all 4 extremities spontaneously; no lateralization.  Skin: Skin is warm and dry. She is not diaphoretic.  Psychiatric: Mood, memory, affect and judgment normal.   MEDICATIONS, LABS & OTHER ORDERS: Previous Medications   ALBUTEROL (PROVENTIL HFA;VENTOLIN HFA) 108 (90 BASE) MCG/ACT INHALER    Inhale 1-2 puffs into the lungs every 4 (four) hours as needed for wheezing or shortness of breath.   BUPROPION (WELLBUTRIN XL) 150 MG 24 HR TABLET    Take 1 tablet (150 mg total) by mouth daily.   CYCLOBENZAPRINE (FLEXERIL) 10 MG TABLET    TAKE ONE TABLET BY MOUTH TWICE DAILY AS NEEDED FOR MUSCLE SPASMS   EQ LORATADINE 10  MG TABLET    TAKE ONE TABLET BY MOUTH ONCE DAILY   LEVOTHYROXINE (SYNTHROID, LEVOTHROID) 137 MCG TABLET    Take 205.5 mcg by mouth every other day.   LISINOPRIL (PRINIVIL,ZESTRIL) 20 MG TABLET    Take 20 mg by mouth every morning.   METOPROLOL TARTRATE (LOPRESSOR) 25 MG TABLET    TAKE ONE TABLET BY MOUTH TWICE DAILY   ONDANSETRON (ZOFRAN-ODT) 4 MG DISINTEGRATING TABLET    Take 4 mg by mouth every 8 (eight) hours as needed for nausea.    OXYCODONE-ACETAMINOPHEN (PERCOCET/ROXICET) 5-325 MG PER TABLET    Take 1 tablet by mouth every 8 (eight) hours as needed for moderate pain or severe pain.   Modified Medications   No medications on file   New Prescriptions   BENZONATATE (TESSALON) 200 MG CAPSULE    Take 1 capsule (200 mg total) by mouth 3 (three) times daily as needed for cough.   LEVOFLOXACIN (LEVAQUIN) 500 MG TABLET    Take 1 tablet (500 mg total) by mouth daily.   Discontinued Medications   No medications on file   Orders Placed This Encounter  Procedures  . DG Chest 2 View  . CBC w/Diff  . Comprehensive metabolic panel  . TSH  . T4, free   ASSESSMENT & PLAN: See problem based charting & AVS for pt instructions.

## 2013-10-14 ENCOUNTER — Other Ambulatory Visit: Payer: Self-pay | Admitting: Family Medicine

## 2013-10-21 ENCOUNTER — Other Ambulatory Visit: Payer: Self-pay | Admitting: Family Medicine

## 2013-10-21 MED ORDER — CYCLOBENZAPRINE HCL 10 MG PO TABS: 10.0000 mg | ORAL_TABLET | Freq: Two times a day (BID) | ORAL | Status: DC | PRN

## 2013-10-25 ENCOUNTER — Other Ambulatory Visit: Payer: Self-pay

## 2013-11-02 ENCOUNTER — Encounter: Payer: Self-pay | Admitting: Sports Medicine

## 2013-11-05 ENCOUNTER — Emergency Department: Payer: Self-pay | Admitting: Emergency Medicine

## 2013-11-10 ENCOUNTER — Other Ambulatory Visit: Payer: Self-pay | Admitting: *Deleted

## 2013-11-10 ENCOUNTER — Other Ambulatory Visit: Payer: Self-pay | Admitting: Family Medicine

## 2013-11-10 MED ORDER — METOPROLOL TARTRATE 25 MG PO TABS: ORAL_TABLET | ORAL | Status: DC

## 2013-11-15 ENCOUNTER — Encounter (HOSPITAL_COMMUNITY): Payer: Self-pay | Admitting: Dentistry

## 2013-12-02 ENCOUNTER — Other Ambulatory Visit: Payer: Self-pay | Admitting: *Deleted

## 2013-12-02 MED ORDER — METOPROLOL TARTRATE 25 MG PO TABS: ORAL_TABLET | ORAL | Status: DC

## 2013-12-23 ENCOUNTER — Encounter: Payer: Self-pay | Admitting: Family Medicine

## 2013-12-23 ENCOUNTER — Ambulatory Visit (INDEPENDENT_AMBULATORY_CARE_PROVIDER_SITE_OTHER): Payer: Medicaid Other | Admitting: Family Medicine

## 2013-12-23 VITALS — BP 120/78 | HR 101 | Temp 97.9°F | Wt 209.0 lb

## 2013-12-23 DIAGNOSIS — Z9181 History of falling: Secondary | ICD-10-CM

## 2013-12-23 DIAGNOSIS — M25561 Pain in right knee: Secondary | ICD-10-CM | POA: Insufficient documentation

## 2013-12-23 DIAGNOSIS — M25569 Pain in unspecified knee: Secondary | ICD-10-CM

## 2013-12-23 DIAGNOSIS — R296 Repeated falls: Secondary | ICD-10-CM | POA: Insufficient documentation

## 2013-12-23 MED ORDER — IBUPROFEN 800 MG PO TABS: 800.0000 mg | ORAL_TABLET | Freq: Three times a day (TID) | ORAL | Status: DC | PRN

## 2013-12-23 NOTE — Assessment & Plan Note (Signed)
Mechanical fall from right knee pain and laxity. PT referral for gait and ambulation assessment to recommend support if appropriate. In the interim, I recommended she obtain a walking cane at the pharmacy for stability. Patient to return soon if symptom persist.

## 2013-12-23 NOTE — Patient Instructions (Signed)
It was nice meeting you Deborah [name], I am sorry you don't feel good with knee pain, it seem you also have some weakness of your right knee, I will like to recommend physical therapy for now, also obtain a walking cane at the pharmacy for balancing. My guess is that the physical therapy will also get you a walking equipment for stability. F/U in about 2-4wks just to see how you are doing, but please come back sooner if you don't feel any better.

## 2013-12-23 NOTE — Assessment & Plan Note (Addendum)
S/P fall. Improve on Ibuprofen. Likely due to traumatic injury. I don't have her xray report from [facility]. I refilled her Ibuprofen. Recommended obtaining xray from [facility] for review. Patient referred to PT for assessment. Her knee brace was replaced after exam and she was instructed to continue use in addition to obtaining a walking cane. If no improvement and no report of xray available to obtain another xray. Patient agreed with plan.

## 2013-12-23 NOTE — Progress Notes (Signed)
Subjective:     Patient ID: Deborah [name], female   DOB: 1955-10-31, 58 y.o.   MRN: [id number]  HPI Fall/Knee pain: Here for frequent fall and right knee pain. Patient stated she fell on her right knee at the end of May 2015, went to [facility] ED were xray was done, she stated xray was normal but since then her right knee pain had never improve.She uses Knee brace, oxycodone and Ibuprofen 800 mg prn which helped with her pain. She used her knee brace for couple of weeks and decided to take them off then she fell. Her last fall was 4 days ago, she had fallen 3 times in total since may. She denies any dizziness, no weakness, no head lightedness. She feels like her right knee gave out after she removed her brace and she fell. Her knee pain on Monday was described as 11/10 in severity but today about 8/10 in severity. Current Outpatient Prescriptions on File Prior to Visit  Medication Sig Dispense Refill  . albuterol (PROVENTIL HFA;VENTOLIN HFA) 108 (90 BASE) MCG/ACT inhaler Inhale 1-2 puffs into the lungs every 4 (four) hours as needed for wheezing or shortness of breath.  1 Inhaler  11  . buPROPion (WELLBUTRIN XL) 150 MG 24 hr tablet Take 1 tablet (150 mg total) by mouth daily.  90 tablet  6  . cyclobenzaprine (FLEXERIL) 10 MG tablet Take 1 tablet (10 mg total) by mouth 2 (two) times daily as needed for muscle spasms.  30 tablet  0  . EQ LORATADINE 10 MG tablet TAKE ONE TABLET BY MOUTH ONCE DAILY  30 tablet  0  . levothyroxine (SYNTHROID, LEVOTHROID) 125 MCG tablet TAKE ONE TABLET BY MOUTH ONCE DAILY BEFORE  BREAKFAST  30 tablet  0  . lisinopril (PRINIVIL,ZESTRIL) 20 MG tablet TAKE ONE TABLET BY MOUTH ONCE DAILY  30 tablet  0  . metoprolol tartrate (LOPRESSOR) 25 MG tablet TAKE ONE TABLET BY MOUTH TWICE DAILY  60 tablet  0  . omeprazole (PRILOSEC) 40 MG capsule TAKE ONE CAPSULE BY MOUTH IN THE MORNING. TAKE  AT LEAST  FOUR  HOURS  AFTER  TAKING  LEVOTHYROXINE.  30 capsule  0  . ondansetron  (ZOFRAN-ODT) 4 MG disintegrating tablet Take 4 mg by mouth every 8 (eight) hours as needed for nausea.      [name] oxyCODONE-acetaminophen (PERCOCET/ROXICET) 5-325 MG per tablet Take 1 tablet by mouth every 8 (eight) hours as needed for moderate pain or severe pain.      . benzonatate (TESSALON) 200 MG capsule Take 1 capsule (200 mg total) by mouth 3 (three) times daily as needed for cough.  20 capsule  0  . levofloxacin (LEVAQUIN) 500 MG tablet Take 1 tablet (500 mg total) by mouth daily.  7 tablet  0   No current facility-administered medications on file prior to visit.   Past Medical History  Diagnosis Date  . Hypertension   . Tracheostomy dependent   . History of laryngectomy   . Bronchitis   . 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  [name] Heart murmur     asymptomatic   . Leukocytopenia   . Asthma   . Sinusitis, chronic 07/20/2011  . Normal MRI 07/14/11    negative for mestasis   . Hx of radiation therapy 09/03/10 to 10/16/2010    supraglottic larynx  . 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 Neck pain 01/21/2012  . GERD (gastroesophageal reflux  disease)   . Sepsis 08/04/12  . Seizures     07/24/11 off Effexor w/o seizure  . Anemia   . Sinusitis, chronic 07/20/2011    Bilateral maxillary, identified on MRI of head 07/14/11.    . Diverticulosis 11/15/2012     noted on screening colonoscopy   . Internal hemorrhoid 11/15/2012     small, noted on screening colonoscopy   . Nausea alone 07/28/2013      Review of Systems  Constitutional: Negative for fatigue.  Respiratory: Negative.   Cardiovascular: Negative.   Musculoskeletal:       Knee pain  Neurological: Negative for dizziness, weakness, light-headedness and headaches.  All other systems reviewed and are negative.  Filed Vitals:   12/23/13 1040  BP: 120/78  Pulse: 101  Temp: 97.9 F (36.6 C)  TempSrc: Oral  Weight: 209 lb (94.802 kg)       Objective:   Physical Exam  Nursing note and  vitals reviewed. Constitutional: She is oriented to person, place, and time. She appears well-developed. No distress.  Neck:  + Tracheostomy.  Cardiovascular: Normal rate, regular rhythm, normal heart sounds and intact distal pulses.   No murmur heard. Pulmonary/Chest: Effort normal and breath sounds normal. No respiratory distress. She has no wheezes.  Musculoskeletal:       Right knee: She exhibits decreased range of motion. She exhibits no swelling, no effusion, normal alignment and normal meniscus. Tenderness found.       Left knee: Normal.  Knee brace of right knee removed for assessment.Mild laxity of her right knee joint.  Neurological: She is alert and oriented to person, place, and time. No cranial nerve deficit.  Gait steady.       Assessment:     Knee pain Frequent fall     Plan:     Check problem list.

## 2013-12-27 ENCOUNTER — Telehealth: Payer: Self-pay | Admitting: Family Medicine

## 2013-12-27 NOTE — Telephone Encounter (Signed)
I got a message that medicaid will not cover referral to PT for her frequent fall. I called and spoke with patient, she is recommended to go to the ED if still symptomatic. She verbalized understanding.

## 2014-01-04 ENCOUNTER — Encounter (HOSPITAL_COMMUNITY): Payer: Self-pay | Admitting: Dentistry

## 2014-01-04 ENCOUNTER — Ambulatory Visit (HOSPITAL_COMMUNITY): Payer: Medicaid Other | Admitting: Dentistry

## 2014-01-04 VITALS — BP 152/86 | HR 78 | Temp 98.1°F

## 2014-01-04 DIAGNOSIS — Z90710 Acquired absence of both cervix and uterus: Secondary | ICD-10-CM

## 2014-01-04 DIAGNOSIS — K08109 Complete loss of teeth, unspecified cause, unspecified class: Secondary | ICD-10-CM

## 2014-01-04 DIAGNOSIS — K000 Anodontia: Secondary | ICD-10-CM

## 2014-01-04 DIAGNOSIS — Z972 Presence of dental prosthetic device (complete) (partial): Secondary | ICD-10-CM

## 2014-01-04 DIAGNOSIS — C321 Malignant neoplasm of supraglottis: Secondary | ICD-10-CM

## 2014-01-04 DIAGNOSIS — K117 Disturbances of salivary secretion: Secondary | ICD-10-CM

## 2014-01-04 DIAGNOSIS — R682 Dry mouth, unspecified: Secondary | ICD-10-CM

## 2014-01-04 DIAGNOSIS — Z463 Encounter for fitting and adjustment of dental prosthetic device: Secondary | ICD-10-CM

## 2014-01-04 DIAGNOSIS — Z923 Personal history of irradiation: Secondary | ICD-10-CM

## 2014-01-04 NOTE — Progress Notes (Signed)
01/04/2014  Patient:            Deborah [name] Date of Birth:  [birth year] MRN:                [id number]  BP 152/86  Pulse 78  Temp(Src) 98.1 F (36.7 C) (Oral)  Deborah [name] is a 58 year old female that presents for periodic oral exam and evaluation of complete dentures.  Medical Hx Update:  Past Medical History  Diagnosis Date  . Hypertension   . Tracheostomy dependent   . History of laryngectomy   . Bronchitis   . 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  [name] Heart murmur     asymptomatic   . Leukocytopenia   . Asthma   . Sinusitis, chronic 07/20/2011  . Normal MRI 07/14/11    negative for mestasis   . Hx of radiation therapy 09/03/10 to 10/16/2010    supraglottic larynx  . 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 Neck pain 01/21/2012  . GERD (gastroesophageal reflux disease)   . Sepsis 08/04/12  . Seizures     07/24/11 off Effexor w/o seizure  . Anemia   . Sinusitis, chronic 07/20/2011    Bilateral maxillary, identified on MRI of head 07/14/11.    . Diverticulosis 11/15/2012     noted on screening colonoscopy   . Internal hemorrhoid 11/15/2012     small, noted on screening colonoscopy   . Nausea alone 07/28/2013  . ALLERGIES/ADVERSE DRUG REACTIONS: No Known Allergies MEDICATIONS: Current Outpatient Prescriptions  Medication Sig Dispense Refill  . albuterol (PROVENTIL HFA;VENTOLIN HFA) 108 (90 BASE) MCG/ACT inhaler Inhale 1-2 puffs into the lungs every 4 (four) hours as needed for wheezing or shortness of breath.  1 Inhaler  11  . benzonatate (TESSALON) 200 MG capsule Take 1 capsule (200 mg total) by mouth 3 (three) times daily as needed for cough.  20 capsule  0  . buPROPion (WELLBUTRIN XL) 150 MG 24 hr tablet Take 1 tablet (150 mg total) by mouth daily.  90 tablet  6  . cyclobenzaprine (FLEXERIL) 10 MG tablet Take 1 tablet (10 mg total) by mouth 2 (two) times daily as needed for muscle spasms.  30 tablet  0  . EQ LORATADINE 10 MG  tablet TAKE ONE TABLET BY MOUTH ONCE DAILY  30 tablet  0  . ibuprofen (ADVIL,MOTRIN) 800 MG tablet Take 1 tablet (800 mg total) by mouth every 8 (eight) hours as needed.  60 tablet  0  . levofloxacin (LEVAQUIN) 500 MG tablet Take 1 tablet (500 mg total) by mouth daily.  7 tablet  0  . levothyroxine (SYNTHROID, LEVOTHROID) 125 MCG tablet TAKE ONE TABLET BY MOUTH ONCE DAILY BEFORE  BREAKFAST  30 tablet  0  . lisinopril (PRINIVIL,ZESTRIL) 20 MG tablet TAKE ONE TABLET BY MOUTH ONCE DAILY  30 tablet  0  . metoprolol tartrate (LOPRESSOR) 25 MG tablet TAKE ONE TABLET BY MOUTH TWICE DAILY  60 tablet  0  . omeprazole (PRILOSEC) 40 MG capsule TAKE ONE CAPSULE BY MOUTH IN THE MORNING. TAKE  AT LEAST  FOUR  HOURS  AFTER  TAKING  LEVOTHYROXINE.  30 capsule  0  . ondansetron (ZOFRAN-ODT) 4 MG disintegrating tablet Take 4 mg by mouth every 8 (eight) hours as needed for nausea.      [name] oxyCODONE-acetaminophen (PERCOCET/ROXICET) 5-325 MG per tablet Take 1 tablet by mouth every 8 (eight) hours as needed for moderate  pain or severe pain.       No current facility-administered medications for this visit.    C/C: Patient presents for periodic oral examination and evaluation of upper and lower complete dentures   HPI:   Patient had upper and  lower complete dentures inserted on 02/27/2011. Patient had reline procedures done on November 30, 2012. The patient now presents for periodic oral examination and evaluation of the upper and lower complete dentures. Patient is not having any problems with her upper or lower complete dentures.  DENTAL EXAM: General: Patient is a well-developed, well-nourished female in no acute distress. Tracheostomy is present. Vitals: Blood pressure 152/86, pulse 78, temperature 98.1 F (36.7 C), temperature source Oral.  Extraoral Exam:  There is no palpable lymphadenopathy. There are no TMJ Symptoms. The stoma is still in place. Intraoral  Exam: Patient has xerostomia. There are no soft  tissue lesions or denture irritation noted. Generalized erythema is noted secondary to dryness. Patient denies any recent smoking. Dentition: Patient is edentulous. Prosthodontic: The patient has upper and lower complete dentures. Dentures are stable and retentive.  Pressure indicating paste was applied and dentures were adjusted as needed.  Thick PIP was applied to the lower dentures and borders were adjusted as needed. Dentures were polished.  Occlusion:  The denture occlusion was evaluated. The patient has acceptable occlusion.   Assessments: 1. Patient is edentulous. 2. upper and lower complete dentures are acceptable.   Plan:  1. Patient to followup with dental medicine as scheduled for yearly recall .  2. patient to keep dentures out if sore spots arise. Use salt water rinses as needed aid healing. Call if problems arise before next scheduled appointment.   Lenn Cal, DDS

## 2014-01-04 NOTE — Patient Instructions (Signed)
Keep dentures out if sore spots arise. Use salt water rinses as needed to aid healing. Return to clinic as scheduled. Call if problems arise before then. Dr. Idamae Coccia 

## 2014-01-18 ENCOUNTER — Ambulatory Visit (INDEPENDENT_AMBULATORY_CARE_PROVIDER_SITE_OTHER): Payer: Medicaid Other | Admitting: Obstetrics and Gynecology

## 2014-01-18 ENCOUNTER — Encounter: Payer: Self-pay | Admitting: Obstetrics and Gynecology

## 2014-01-18 ENCOUNTER — Other Ambulatory Visit: Payer: Self-pay | Admitting: Family Medicine

## 2014-01-18 VITALS — BP 99/67 | HR 71 | Temp 98.3°F | Ht 67.0 in | Wt 205.6 lb

## 2014-01-18 DIAGNOSIS — I10 Essential (primary) hypertension: Secondary | ICD-10-CM

## 2014-01-18 DIAGNOSIS — A088 Other specified intestinal infections: Secondary | ICD-10-CM

## 2014-01-18 DIAGNOSIS — M25561 Pain in right knee: Secondary | ICD-10-CM

## 2014-01-18 DIAGNOSIS — A084 Viral intestinal infection, unspecified: Secondary | ICD-10-CM | POA: Insufficient documentation

## 2014-01-18 DIAGNOSIS — F411 Generalized anxiety disorder: Secondary | ICD-10-CM

## 2014-01-18 DIAGNOSIS — M25569 Pain in unspecified knee: Secondary | ICD-10-CM

## 2014-01-18 MED ORDER — BUPROPION HCL ER (XL) 150 MG PO TB24: 300.0000 mg | ORAL_TABLET | Freq: Every day | ORAL | Status: DC

## 2014-01-18 NOTE — Assessment & Plan Note (Signed)
A: Knee pain improved. P: Continue on ibuprofen when necessary. Suggested to patient the need for a walker. Will continue to monitor and get repeat imaging if needed.

## 2014-01-18 NOTE — Assessment & Plan Note (Signed)
A: Patient did not appear anxious on exam. Will reassess next time I see patient. P: At this visit I increased patient's Wellbutrin to 300 mg daily. Also suggested patient try melatonin for sleep. Patient was given Dr. Tod Persia card.

## 2014-01-18 NOTE — Progress Notes (Signed)
Patient ID: Deborah [name], female   DOB: 29-Oct-1955, 58 y.o.   MRN: [id number]     Subjective:  Chief Complaint  Patient presents with  . Follow-up    knee  . Medication Refill    HPI: Deborah [name] is a 58 y.o. who presented to clinic today to follow up on her knee after a series of falls recently. She was seen in clinic on July 14 initially. An x-ray was done at that time that showed no broken bones. Today she states that she has had no more falls. She still has not been able to get a walker. She says that she is sometimes unsteady on her feet. She is taking ibuprofen for pain. She does state that the knee pain is better than her previous visit.  Also at this visit today she complains of an upset stomach. She said that after she ate breakfast this morning she had some nausea and vomiting x3. She also says that she has some diarrhea. She complains of some lightheadedness as well. Denies seeing blood in her vomit or stool.   Lastly today the patient stated that she has had increased anxiety. She stated that there have been no new changes that would have caused this new onset of anxiety. She states that she is not able to sleep well. She says that she sleeps about an hour at night then is awake for about 3-4hrs. Sometimes she doesn't sleep at all. This has been going on for the last month and a half. Patient says that she sleeps in her bed in her room with the TV on. She has never been on any medications for sleep. She is requesting medication at this time to help with sleep.  Patient is requesting to have all her medications refilled at this visit.  All systems were reviewed and were negative unless otherwise noted in the HPI  Past Medical History  Diagnosis Date  . Hypertension   . Tracheostomy dependent   . History of laryngectomy   . Bronchitis   . 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  [name] Heart murmur     asymptomatic   .  Leukocytopenia   . Asthma   . Sinusitis, chronic 07/20/2011  . Normal MRI 07/14/11    negative for mestasis   . Hx of radiation therapy 09/03/10 to 10/16/2010    supraglottic larynx  . 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 Neck pain 01/21/2012  . GERD (gastroesophageal reflux disease)   . Sepsis 08/04/12  . Seizures     07/24/11 off Effexor w/o seizure  . Anemia   . Sinusitis, chronic 07/20/2011    Bilateral maxillary, identified on MRI of head 07/14/11.    . Diverticulosis 11/15/2012     noted on screening colonoscopy   . Internal hemorrhoid 11/15/2012     small, noted on screening colonoscopy   . Nausea alone 07/28/2013   Past Medical, Surgical, Social, and Family History Reviewed & Updated per EMR.  Objective: BP 99/67  Pulse 71  Temp(Src) 98.3 F (36.8 C) (Oral)  Ht 5\' 7"  (1.702 m)  Wt 205 lb 9.6 oz (93.26 kg)  BMI 32.19 kg/m2 General: A&Ox3. She appears well-developed. No distress.  Neck: + Tracheostomy.  Cardiovascular: RRR and intact distal pulses. No murmur heard.  Lungst: Effort normal and breath sounds normal. No respiratory distress. She has no wheezes.  Abdomen: soft, NTND, +BS Musculoskeletal: BLE have FROM. No  tenderness. She exhibits no swelling, no effusion, normal alignment and normal meniscus.   Neurological: She is alert and oriented. No cranial nerve deficit. No neurologic focalization. Gait unsteady.  Skin: turgor normal and no rashes.   Assessment/Plan: Please see problem based Assessment and [place], DO 01/18/2014, 3:10 PM PGY-1, [place]

## 2014-01-18 NOTE — Assessment & Plan Note (Signed)
A: Patient appears stable after her bouts of vomiting and diarrhea.  P: Patient instructed to take home Zofran as needed for nausea or vomiting. Told patient to maintain hydration. Patient instructed to seek additional medical treatment if vomiting and diarrhea continue.

## 2014-01-18 NOTE — Patient Instructions (Signed)
Deborah [name] it was a pleasure to meet you today!  I am sorry to hear that you are not feeling well. Please make sure to stay hydrated since you are having some nausea and vomiting.  Try and get a walker to help with walking so we can prevent any more falls.  As far as your anxiety I want you to call and see if you can get in contact with Dr. Gwenlyn Saran. I am going to increase your Wellbutrin dose. And for sleep look for Melatonin.  I want you to schedule a nurse visit for a week because your BP was low today.  I will refill all your medications today as well.   Thanks for allowing me to be a part of your care! Dr. Gerarda Fraction

## 2014-01-18 NOTE — Assessment & Plan Note (Signed)
The patient was hypotensive today with blood pressures 99/67. Told patient to come in for a nurse visit in one week to recheck blood pressure. If blood pressure remains low will adjust her blood pressure medications.

## 2014-01-25 ENCOUNTER — Ambulatory Visit (INDEPENDENT_AMBULATORY_CARE_PROVIDER_SITE_OTHER): Payer: Medicaid Other | Admitting: *Deleted

## 2014-01-25 VITALS — BP 122/84

## 2014-01-25 DIAGNOSIS — Z0130 Encounter for examination of blood pressure without abnormal findings: Secondary | ICD-10-CM

## 2014-01-25 DIAGNOSIS — Z136 Encounter for screening for cardiovascular disorders: Secondary | ICD-10-CM

## 2014-01-25 NOTE — Progress Notes (Signed)
   Pt in nurse clinic for blood pressure check.  BP 122/84 manually.  Pt is taking medications as prescribed.  Pt denies any symptoms today.  Derl Barrow, RN

## 2014-02-02 ENCOUNTER — Other Ambulatory Visit: Payer: Self-pay | Admitting: *Deleted

## 2014-02-02 MED ORDER — IBUPROFEN 800 MG PO TABS: 800.0000 mg | ORAL_TABLET | Freq: Three times a day (TID) | ORAL | Status: DC | PRN

## 2014-03-29 ENCOUNTER — Ambulatory Visit (INDEPENDENT_AMBULATORY_CARE_PROVIDER_SITE_OTHER): Payer: Medicaid Other | Admitting: *Deleted

## 2014-03-29 DIAGNOSIS — Z23 Encounter for immunization: Secondary | ICD-10-CM

## 2014-04-13 ENCOUNTER — Other Ambulatory Visit: Payer: Self-pay | Admitting: *Deleted

## 2014-04-13 ENCOUNTER — Other Ambulatory Visit: Payer: Self-pay | Admitting: Family Medicine

## 2014-04-13 DIAGNOSIS — R059 Cough, unspecified: Secondary | ICD-10-CM

## 2014-04-13 DIAGNOSIS — R05 Cough: Secondary | ICD-10-CM

## 2014-04-28 ENCOUNTER — Inpatient Hospital Stay (HOSPITAL_COMMUNITY)
Admission: EM | Admit: 2014-04-28 | Discharge: 2014-04-30 | DRG: 191 | Disposition: A | Discharge status: Home / Self Care | Payer: Medicaid Other | Attending: Family Medicine | Admitting: Family Medicine

## 2014-04-28 ENCOUNTER — Encounter (HOSPITAL_COMMUNITY): Payer: Self-pay | Admitting: Emergency Medicine

## 2014-04-28 ENCOUNTER — Emergency Department (HOSPITAL_COMMUNITY): Payer: Medicaid Other

## 2014-04-28 DIAGNOSIS — Z9221 Personal history of antineoplastic chemotherapy: Secondary | ICD-10-CM

## 2014-04-28 DIAGNOSIS — Z87891 Personal history of nicotine dependence: Secondary | ICD-10-CM

## 2014-04-28 DIAGNOSIS — F329 Major depressive disorder, single episode, unspecified: Secondary | ICD-10-CM | POA: Diagnosis present

## 2014-04-28 DIAGNOSIS — I5032 Chronic diastolic (congestive) heart failure: Secondary | ICD-10-CM | POA: Diagnosis present

## 2014-04-28 DIAGNOSIS — R0609 Other forms of dyspnea: Secondary | ICD-10-CM

## 2014-04-28 DIAGNOSIS — Z8521 Personal history of malignant neoplasm of larynx: Secondary | ICD-10-CM | POA: Diagnosis not present

## 2014-04-28 DIAGNOSIS — R0600 Dyspnea, unspecified: Secondary | ICD-10-CM | POA: Diagnosis not present

## 2014-04-28 DIAGNOSIS — J45909 Unspecified asthma, uncomplicated: Secondary | ICD-10-CM | POA: Diagnosis present

## 2014-04-28 DIAGNOSIS — J209 Acute bronchitis, unspecified: Secondary | ICD-10-CM | POA: Diagnosis present

## 2014-04-28 DIAGNOSIS — J441 Chronic obstructive pulmonary disease with (acute) exacerbation: Principal | ICD-10-CM | POA: Diagnosis present

## 2014-04-28 DIAGNOSIS — J9811 Atelectasis: Secondary | ICD-10-CM | POA: Diagnosis not present

## 2014-04-28 DIAGNOSIS — K219 Gastro-esophageal reflux disease without esophagitis: Secondary | ICD-10-CM | POA: Diagnosis present

## 2014-04-28 DIAGNOSIS — R0902 Hypoxemia: Secondary | ICD-10-CM | POA: Diagnosis present

## 2014-04-28 DIAGNOSIS — E039 Hypothyroidism, unspecified: Secondary | ICD-10-CM | POA: Diagnosis present

## 2014-04-28 DIAGNOSIS — I10 Essential (primary) hypertension: Secondary | ICD-10-CM | POA: Diagnosis present

## 2014-04-28 DIAGNOSIS — F411 Generalized anxiety disorder: Secondary | ICD-10-CM | POA: Diagnosis present

## 2014-04-28 DIAGNOSIS — Z930 Tracheostomy status: Secondary | ICD-10-CM

## 2014-04-28 DIAGNOSIS — Z79899 Other long term (current) drug therapy: Secondary | ICD-10-CM

## 2014-04-28 DIAGNOSIS — E1159 Type 2 diabetes mellitus with other circulatory complications: Secondary | ICD-10-CM | POA: Diagnosis present

## 2014-04-28 DIAGNOSIS — Z79891 Long term (current) use of opiate analgesic: Secondary | ICD-10-CM | POA: Diagnosis not present

## 2014-04-28 DIAGNOSIS — Z66 Do not resuscitate: Secondary | ICD-10-CM | POA: Diagnosis present

## 2014-04-28 DIAGNOSIS — Z923 Personal history of irradiation: Secondary | ICD-10-CM

## 2014-04-28 DIAGNOSIS — I509 Heart failure, unspecified: Secondary | ICD-10-CM

## 2014-04-28 DIAGNOSIS — Z791 Long term (current) use of non-steroidal anti-inflammatories (NSAID): Secondary | ICD-10-CM | POA: Diagnosis not present

## 2014-04-28 DIAGNOSIS — J40 Bronchitis, not specified as acute or chronic: Secondary | ICD-10-CM

## 2014-04-28 DIAGNOSIS — I152 Hypertension secondary to endocrine disorders: Secondary | ICD-10-CM | POA: Diagnosis present

## 2014-04-28 HISTORY — DX: Other forms of dyspnea: R06.09

## 2014-04-28 LAB — CBC WITH DIFFERENTIAL/PLATELET
BASOS PCT: 1 % (ref 0–1)
Basophils Absolute: 0 10*3/uL (ref 0.0–0.1)
Eosinophils Absolute: 0.2 10*3/uL (ref 0.0–0.7)
Eosinophils Relative: 4 % (ref 0–5)
HCT: 34.5 % — ABNORMAL LOW (ref 36.0–46.0)
Hemoglobin: 11.4 g/dL — ABNORMAL LOW (ref 12.0–15.0)
Lymphocytes Relative: 47 % — ABNORMAL HIGH (ref 12–46)
Lymphs Abs: 2.3 10*3/uL (ref 0.7–4.0)
MCH: 30.9 pg (ref 26.0–34.0)
MCHC: 33 g/dL (ref 30.0–36.0)
MCV: 93.5 fL (ref 78.0–100.0)
Monocytes Absolute: 0.3 10*3/uL (ref 0.1–1.0)
Monocytes Relative: 6 % (ref 3–12)
NEUTROS ABS: 2.1 10*3/uL (ref 1.7–7.7)
Neutrophils Relative %: 42 % — ABNORMAL LOW (ref 43–77)
Platelets: 335 10*3/uL (ref 150–400)
RBC: 3.69 MIL/uL — ABNORMAL LOW (ref 3.87–5.11)
RDW: 14.3 % (ref 11.5–15.5)
WBC: 5 10*3/uL (ref 4.0–10.5)

## 2014-04-28 LAB — BASIC METABOLIC PANEL
ANION GAP: 15 (ref 5–15)
BUN: 13 mg/dL (ref 6–23)
CHLORIDE: 105 meq/L (ref 96–112)
CO2: 24 mEq/L (ref 19–32)
Calcium: 9.1 mg/dL (ref 8.4–10.5)
Creatinine, Ser: 0.98 mg/dL (ref 0.50–1.10)
GFR calc Af Amer: 73 mL/min — ABNORMAL LOW (ref >90)
GFR calc non Af Amer: 63 mL/min — ABNORMAL LOW (ref >90)
Glucose, Bld: 81 mg/dL (ref 70–99)
Potassium: 3.8 mEq/L (ref 3.7–5.3)
Sodium: 144 mEq/L (ref 137–147)

## 2014-04-28 LAB — I-STAT TROPONIN, ED: Troponin i, poc: 0.01 ng/mL (ref 0.00–0.08)

## 2014-04-28 LAB — TROPONIN I
Troponin I: <0.3 ng/mL (ref <0.30)
Troponin I: <0.3 ng/mL (ref <0.30)

## 2014-04-28 LAB — PRO B NATRIURETIC PEPTIDE: Pro B Natriuretic peptide (BNP): 38.9 pg/mL (ref 0–125)

## 2014-04-28 MED ORDER — BUPROPION HCL ER (XL) 300 MG PO TB24
300.0000 mg | ORAL_TABLET | Freq: Every day | ORAL | Status: DC
  Administered 2014-04-28 – 2014-04-30 (×3): 300 mg via ORAL
  Filled 2014-04-28 (×3): qty 1

## 2014-04-28 MED ORDER — IBUPROFEN 800 MG PO TABS
800.0000 mg | ORAL_TABLET | Freq: Three times a day (TID) | ORAL | Status: DC | PRN
  Administered 2014-04-28 – 2014-04-30 (×5): 800 mg via ORAL
  Filled 2014-04-28 (×8): qty 1

## 2014-04-28 MED ORDER — IPRATROPIUM-ALBUTEROL 0.5-2.5 (3) MG/3ML IN SOLN
RESPIRATORY_TRACT | Status: AC
  Administered 2014-04-28: 3 mL
  Filled 2014-04-28: qty 3

## 2014-04-28 MED ORDER — LISINOPRIL 20 MG PO TABS
20.0000 mg | ORAL_TABLET | Freq: Every day | ORAL | Status: DC
  Administered 2014-04-28 – 2014-04-30 (×3): 20 mg via ORAL
  Filled 2014-04-28 (×3): qty 1

## 2014-04-28 MED ORDER — METOPROLOL TARTRATE 25 MG PO TABS
25.0000 mg | ORAL_TABLET | Freq: Two times a day (BID) | ORAL | Status: DC
  Administered 2014-04-28 – 2014-04-30 (×5): 25 mg via ORAL
  Filled 2014-04-28 (×8): qty 1

## 2014-04-28 MED ORDER — LEVOTHYROXINE SODIUM 100 MCG PO TABS
100.0000 ug | ORAL_TABLET | Freq: Every day | ORAL | Status: DC
  Administered 2014-04-28 – 2014-04-30 (×3): 100 ug via ORAL
  Filled 2014-04-28 (×5): qty 1

## 2014-04-28 MED ORDER — IPRATROPIUM-ALBUTEROL 0.5-2.5 (3) MG/3ML IN SOLN
3.0000 mL | Freq: Once | RESPIRATORY_TRACT | Status: AC
  Administered 2014-04-28: 3 mL via RESPIRATORY_TRACT
  Filled 2014-04-28: qty 3

## 2014-04-28 MED ORDER — SODIUM CHLORIDE 0.9 % IV SOLN: 250.0000 mL | INTRAVENOUS | Status: DC | PRN

## 2014-04-28 MED ORDER — AZITHROMYCIN 500 MG PO TABS
500.0000 mg | ORAL_TABLET | Freq: Every day | ORAL | Status: DC
  Administered 2014-04-28 – 2014-04-30 (×3): 500 mg via ORAL
  Filled 2014-04-28: qty 2
  Filled 2014-04-28 (×2): qty 1

## 2014-04-28 MED ORDER — IPRATROPIUM-ALBUTEROL 0.5-2.5 (3) MG/3ML IN SOLN
3.0000 mL | Freq: Four times a day (QID) | RESPIRATORY_TRACT | Status: DC
  Administered 2014-04-28 – 2014-04-30 (×5): 3 mL via RESPIRATORY_TRACT
  Filled 2014-04-28 (×7): qty 3

## 2014-04-28 MED ORDER — PANTOPRAZOLE SODIUM 40 MG PO TBEC
80.0000 mg | DELAYED_RELEASE_TABLET | Freq: Every day | ORAL | Status: DC
  Administered 2014-04-28 – 2014-04-30 (×3): 80 mg via ORAL
  Filled 2014-04-28 (×3): qty 2

## 2014-04-28 MED ORDER — SODIUM CHLORIDE 0.9 % IJ SOLN
3.0000 mL | Freq: Two times a day (BID) | INTRAMUSCULAR | Status: DC
  Administered 2014-04-29 (×2): 3 mL via INTRAVENOUS

## 2014-04-28 MED ORDER — IOHEXOL 350 MG/ML SOLN
100.0000 mL | Freq: Once | INTRAVENOUS | Status: AC | PRN
  Administered 2014-04-28: 80 mL via INTRAVENOUS

## 2014-04-28 MED ORDER — HEPARIN SODIUM (PORCINE) 5000 UNIT/ML IJ SOLN
5000.0000 [IU] | Freq: Three times a day (TID) | INTRAMUSCULAR | Status: DC
  Administered 2014-04-28 – 2014-04-30 (×5): 5000 [IU] via SUBCUTANEOUS
  Filled 2014-04-28 (×6): qty 1

## 2014-04-28 MED ORDER — IPRATROPIUM BROMIDE 0.02 % IN SOLN: 0.5000 mg | Freq: Four times a day (QID) | RESPIRATORY_TRACT | Status: DC

## 2014-04-28 MED ORDER — METHYLPREDNISOLONE SODIUM SUCC 125 MG IJ SOLR
125.0000 mg | Freq: Once | INTRAMUSCULAR | Status: AC
  Administered 2014-04-28: 125 mg via INTRAVENOUS
  Filled 2014-04-28: qty 2

## 2014-04-28 MED ORDER — GUAIFENESIN ER 600 MG PO TB12
600.0000 mg | ORAL_TABLET | Freq: Two times a day (BID) | ORAL | Status: DC
  Administered 2014-04-28 – 2014-04-30 (×5): 600 mg via ORAL
  Filled 2014-04-28 (×6): qty 1

## 2014-04-28 MED ORDER — SODIUM CHLORIDE 0.9 % IJ SOLN: 3.0000 mL | INTRAMUSCULAR | Status: DC | PRN

## 2014-04-28 MED ORDER — ALBUTEROL SULFATE (2.5 MG/3ML) 0.083% IN NEBU: 2.5000 mg | INHALATION_SOLUTION | Freq: Four times a day (QID) | RESPIRATORY_TRACT | Status: DC

## 2014-04-28 MED ORDER — CYCLOBENZAPRINE HCL 10 MG PO TABS
10.0000 mg | ORAL_TABLET | Freq: Two times a day (BID) | ORAL | Status: DC | PRN
  Administered 2014-04-29: 10 mg via ORAL
  Filled 2014-04-28: qty 1

## 2014-04-28 MED ORDER — ONDANSETRON 4 MG PO TBDP
4.0000 mg | ORAL_TABLET | Freq: Three times a day (TID) | ORAL | Status: DC | PRN
  Filled 2014-04-28: qty 1

## 2014-04-28 MED ORDER — LORATADINE 10 MG PO TABS
10.0000 mg | ORAL_TABLET | Freq: Every day | ORAL | Status: DC
  Administered 2014-04-28 – 2014-04-30 (×3): 10 mg via ORAL
  Filled 2014-04-28 (×3): qty 1

## 2014-04-28 MED ORDER — BENZONATATE 100 MG PO CAPS
200.0000 mg | ORAL_CAPSULE | Freq: Three times a day (TID) | ORAL | Status: DC | PRN
  Administered 2014-04-28 – 2014-04-30 (×3): 200 mg via ORAL
  Filled 2014-04-28 (×4): qty 2

## 2014-04-28 MED ORDER — SODIUM CHLORIDE 0.9 % IJ SOLN
3.0000 mL | Freq: Two times a day (BID) | INTRAMUSCULAR | Status: DC
  Administered 2014-04-28 – 2014-04-29 (×2): 3 mL via INTRAVENOUS

## 2014-04-28 NOTE — Progress Notes (Signed)
Pt placed on 21% ATC per pt request. Pt states she does use humidity at home. Pt tolerating at this time. RT will continue to monitor.

## 2014-04-28 NOTE — ED Notes (Signed)
Admitting MD at bedside.

## 2014-04-28 NOTE — Progress Notes (Signed)
Pt has much stronger, much more productive sounding cough since seen earlier in ED. Pt is still currently on 21% ATC. Pt continues to refuse suctioning at this time. RT will continue to monitor.

## 2014-04-28 NOTE — ED Notes (Addendum)
Pt states she has a prosthesis, NOT a trach.

## 2014-04-28 NOTE — ED Notes (Signed)
Patient transported to X-ray 

## 2014-04-28 NOTE — H&P (Signed)
[facility] Admission History and Physical Service Pager: [phone]  Patient name: Deborah [name] record number: [id number] Date of birth: 09/16/1955 Age: 58 y.o. Gender: female  Primary Care Provider: Diona Fanti, DO Consultants: none Code Status: DNR (discussed with patient upon admission)  Chief Complaint: dypsnea  Assessment and Plan: Deborah [name] is a 58 y.o. female presenting with dyspnea . PMH is significant for laryngeal cancer, hypothyroidism, HTN, former smoker.  # Dyspnea: etiology not entirely clear, although likely bronchitis given CT angio findings. Other possible etiologies include ACS, CHF, anxiety. S/p IV solumedrol in ER, suspect this will help some if symptoms are due to bronchitis. Currently not hypoxic with no respiratory distress. -admit to tele floor for monitoring -check BNP, 2d echo to eval for heart failure or other structural heart abnormalities -O2 prn to keep sats >92% -mucinex BID -duonebs q6h  -cycle troponins x 3 -EKG in AM -start azithromycin to cover atypicals (possible COPD given prolonged smoking hx, although has now quit) -consider additional steroids if derives benefit from solumedrol  # Hypothyroid: last TSH was 7.93 in April but free T4 normal so changes not made. -continue home dose for now, recommend outpatient TSH once feeling more at baseline  # HTN: normotensive currently - continue lisinopril & metoprolol  # Psych: continue wellbutrin  FEN/GI: heart healthy diet, saline lock IV Prophylaxis: SQ heparin  Disposition: admit to telemetry, dispo pending clinical improvement  History of Present Illness: Deborah [name] is a 58 y.o. female presenting with dyspnea.  Patient states that for the last several weeks she's been short of breath. This acutely worsened over the last few days. When she woke up this morning she was very short of breath. She thus came to the hospital. She denies  any chest pain, or lower extremity edema. The shortness of breath is the same regardless of whether she is walking around or not. Denies orthopnea. She's been using her home humidifier and albuterol frequently, but this is no longer working. She is very nervous about going back home while she is so short of breath. Two days ago had some small volume hemoptysis that has since stopped.  Review Of Systems: Per HPI. Otherwise 12 point review of systems was performed and was unremarkable.  Patient Active Problem List   Diagnosis Date Noted  . Viral gastroenteritis 01/18/2014  . Anxiety state, unspecified 01/18/2014  . Right knee pain 12/23/2013  . Frequent falls 12/23/2013  . Nausea alone 07/28/2013  . Cough 06/24/2013  . Bilateral shoulder pain 04/07/2013  . Anemia 08/11/2012  . Status post trachelectomy 08/04/2012  . Chronic pain 01/20/2012  . 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 Hx of radiation therapy   . Seizure 07/17/2011  . Hypothyroidism 03/19/2011  . HTN (hypertension) 03/19/2011  . Former smoker 03/19/2011   Past Medical History: Past Medical History  Diagnosis Date  . Hypertension   . Tracheostomy dependent   . History of laryngectomy   . Bronchitis   . 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  [name] Heart murmur     asymptomatic   . Leukocytopenia   . Asthma   . Sinusitis, chronic 07/20/2011  . Normal MRI 07/14/11    negative for mestasis   . Hx of radiation therapy 09/03/10 to 10/16/2010    supraglottic larynx  . 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 Neck pain 01/21/2012  . GERD (gastroesophageal reflux disease)   .  Sepsis 08/04/12  . Seizures     07/24/11 off Effexor w/o seizure  . Anemia   . Sinusitis, chronic 07/20/2011    Bilateral maxillary, identified on MRI of head 07/14/11.    . Diverticulosis 11/15/2012     noted on screening colonoscopy   . Internal hemorrhoid 11/15/2012     small, noted on screening colonoscopy    . Nausea alone 07/28/2013   Past Surgical History: Past Surgical History  Procedure Laterality Date  . Portacath placement  09/17/10    Tip in cavoatrial junction  . Laryngectomy    . Tracheal dilitation  07/16/2011    Procedure: TRACHEAL DILITATION;  Surgeon: Beckie Salts, MD;  Location: [place];  Service: ENT;  Laterality: N/A;  dilation of tracheal stoma and replacement of stoma tube  . Foreign body removal bronchial  10/02/2011    Procedure: REMOVAL FOREIGN BODY BRONCHIAL;  Surgeon: Ruby Cola, MD;  Location: [place];  Service: ENT;  Laterality: N/A;  . Esophagoscopy  06/21/2012    Procedure: ESOPHAGOSCOPY;  Surgeon: [name], MD;  Location: [place];  Service: ENT;  Laterality: N/A;  . Tubal ligation  1982  . Colonoscopy N/A 11/15/2012    Procedure: COLONOSCOPY;  Surgeon: Lafayette Dragon, MD;  Location: [facility];  Service: Endoscopy;  Laterality: N/A;  . Breast surgery     Social History: History  Substance Use Topics  . Smoking status: Former Smoker -- 2.00 packs/day for 20 years    Types: Cigarettes    Quit date: 09/17/2010  . Smokeless tobacco: Never Used  . Alcohol Use: No     Comment: Former drinker 6 months ago   Additional social history: denies current tobacco, alcohol, drug use  Please also refer to relevant sections of EMR.  Family History: Family History  Problem Relation Age of Onset  . Heart disease Mother   . Heart disease Father   . Heart disease Sister    Allergies and Medications: No Known Allergies No current facility-administered medications on file prior to encounter.   Current Outpatient Prescriptions on File Prior to Encounter  Medication Sig Dispense Refill  . benzonatate (TESSALON) 200 MG capsule Take 1 capsule (200 mg total) by mouth 3 (three) times daily as needed for cough. 20 capsule 0  . ibuprofen (ADVIL,MOTRIN) 800 MG tablet Take 1 tablet (800 mg total) by mouth every 8 (eight) hours as needed for moderate pain. 60  tablet 0  . ondansetron (ZOFRAN-ODT) 4 MG disintegrating tablet Take 4 mg by mouth every 8 (eight) hours as needed for nausea.    [name] buPROPion (WELLBUTRIN XL) 150 MG 24 hr tablet Take 2 tablets (300 mg total) by mouth daily. 60 tablet 4  . cyclobenzaprine (FLEXERIL) 10 MG tablet TAKE ONE TABLET BY MOUTH TWICE DAILY AS NEEDED FOR MUSCLE SPASM 30 tablet 2  . EQ LORATADINE 10 MG tablet TAKE ONE TABLET BY MOUTH ONCE DAILY 30 tablet 0  . EQ LORATADINE 10 MG tablet TAKE ONE TABLET BY MOUTH ONCE DAILY 30 tablet 4  . levofloxacin (LEVAQUIN) 500 MG tablet Take 1 tablet (500 mg total) by mouth daily. 7 tablet 0  . levothyroxine (SYNTHROID, LEVOTHROID) 100 MCG tablet TAKE ONE TABLET BY MOUTH ONCE DAILY BEFORE  BREAKFAST 30 tablet 4  . lisinopril (PRINIVIL,ZESTRIL) 20 MG tablet TAKE ONE TABLET BY MOUTH ONCE DAILY 30 tablet 4  . metoprolol tartrate (LOPRESSOR) 25 MG tablet TAKE ONE TABLET BY MOUTH TWICE DAILY 60 tablet 0  .  omeprazole (PRILOSEC) 40 MG capsule TAKE ONE CAPSULE BY MOUTH IN THE MORNING. TAKE  AT LEAST  FOUR  HOURS  AFTER  TAKING  LEVOTHYROXINE. 30 capsule 0  . omeprazole (PRILOSEC) 40 MG capsule TAKE ONE CAPSULE BY MOUTH IN THE MORNING. TAKE  AT LEAST  FOUR  HOURS  AFTER  TAKING  LEVOTHYROXINE. 30 capsule 4  . oxyCODONE-acetaminophen (PERCOCET/ROXICET) 5-325 MG per tablet Take 1 tablet by mouth every 8 (eight) hours as needed for moderate pain or severe pain.    [name] PROVENTIL HFA 108 (90 BASE) MCG/ACT inhaler INHALE ONE TO TWO PUFFS INTO LUNGS EVERY 4 HOURS AS NEEDED FOR WHEEZING OR SHORTNESS OF BREATH 7 each 0    Objective: BP 160/102 mmHg  Pulse 97  Temp(Src) 97.8 F (36.6 C) (Oral)  Resp 17  Ht 5\' 7"  (1.702 m)  Wt 205 lb (92.987 kg)  BMI 32.10 kg/m2  SpO2 100% Exam: General: NAD, pleasant, cooperative HEENT: trach hole present, wearing trach collar Cardiovascular: RRR no murmurs Respiratory: CTAB, some possible crackles R>L. Very mild increase WOB but overall appears  comfortable. Abdomen: soft, NTTP, NABS Extremities: No appreciable lower extremity edema bilaterally  Skin: no rashes noted Neuro: grossly nonfocal, speech normal  Labs and Imaging: CBC BMET   Recent Labs Lab 04/28/14 0658  WBC 5.0  HGB 11.4*  HCT 34.5*  PLT 335    Recent Labs Lab 04/28/14 0658  NA 144  K 3.8  CL 105  CO2 24  BUN 13  CREATININE 0.98  GLUCOSE 81  CALCIUM 9.1     CTA Chest: No evidence of pulmonary embolism.  Bronchitic changes with scattered subsegmental atelectasis in BILATERAL lower lobes, RIGHT middle lobe and anteromedial RIGHT upper lobe.  Slight increase in size of a LEFT axillary in lymph node.  Small pericardial effusion and cardiac enlargement.  Leeanne Rio, MD 04/28/2014, 10:50 AM PGY-3, [place] [facility] pager: [phone], text pages welcome

## 2014-04-28 NOTE — Plan of Care (Signed)
Problem: Phase I Progression Outcomes Goal: Pain controlled with appropriate interventions Outcome: Completed/Met Date Met:  04/28/14 Goal: Initial discharge plan identified Outcome: Completed/Met Date Met:  04/28/14 Goal: Hemodynamically stable Outcome: Completed/Met Date Met:  04/28/14

## 2014-04-28 NOTE — Progress Notes (Signed)
Patient has a open stoma.  Patient states a "small plastic tube" fills the stoma which allows her to speak.  Tube is not in place at this time.  Patient places her finger at site to speak.  She states this morning she wasn't able to breathe.  Patient currently has a nebulizer running, started by EMS.  Patient HR 96, Spo2 100%. Patient states her breathing feels much better at this time.

## 2014-04-28 NOTE — ED Notes (Signed)
Pt denies any improvement of sob with humidified oxygen.  VS remain stable.

## 2014-04-28 NOTE — ED Notes (Signed)
The patient has a history of asthma and she has a tracheostomy.  This morning she felt like she could not breathe so she took her trach out.   She called EMS and advised them that she was having SOB and she had take her trach out.  EMS responded placed an IV in her left AC gave 5mg  of Albuterol, 521mcg of atrovent and 125mg  of Solumedrol.  The patient was transported here to be evaluated.  Patient is a [employer].

## 2014-04-28 NOTE — Progress Notes (Signed)
Utilization Review Completed.Donne Anon T11/13/2015

## 2014-04-28 NOTE — Discharge Planning (Signed)
Completed DC planning flowsheet.  Pt plans to dc home with self care; has had [facility] in past. Deborah [name] J. Clydene Laming, [place], [place], [employer] [phone]

## 2014-04-28 NOTE — ED Provider Notes (Signed)
CSN: [id number]     Arrival date & time 04/28/14  8416 History   First MD Initiated Contact with Patient 04/28/14 0703     Chief Complaint  Patient presents with  . Shortness of Breath    The patient has a history of asthma and she has a tracheostomy.  This morning she felt like she could not breathe so she took her trach out.     Deborah [name] is a 58 y.o. female with a PMH of laryngeal CA s/p chemo/radiation and laryngectomy in 2012, asthma, and 40 pack year-history presenting with shortness of breath.   She feels like she can't catch her breath for the past week now. Onset was gradual, it is worsening. She reports some associated upper chest/shoulder pain that is worse with movement of her left arm and is not exertional nor better with rest. chest pain is described as mod-severe, sharp, stabbing not worse with breathing. Symptoms were waxing and waning and continued worsening until she couldn't take it this morning. She has been using albuterol inhaler without any improvement in symptoms. She usually doesn't feel like she needs an inhaler at all. Now using it 3 times per day.   (Consider location/radiation/quality/duration/timing/severity/associated sxs/prior Treatment) HPI  Past Medical History  Diagnosis Date  . Hypertension   . Tracheostomy dependent   . History of laryngectomy   . Bronchitis   . 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  [name] Heart murmur     asymptomatic   . Leukocytopenia   . Asthma   . Sinusitis, chronic 07/20/2011  . Normal MRI 07/14/11    negative for mestasis   . Hx of radiation therapy 09/03/10 to 10/16/2010    supraglottic larynx  . 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 Neck pain 01/21/2012  . GERD (gastroesophageal reflux disease)   . Sepsis 08/04/12  . Seizures     07/24/11 off Effexor w/o seizure  . Anemia   . Sinusitis, chronic 07/20/2011    Bilateral maxillary, identified on MRI of head 07/14/11.    .  Diverticulosis 11/15/2012     noted on screening colonoscopy   . Internal hemorrhoid 11/15/2012     small, noted on screening colonoscopy   . Nausea alone 07/28/2013   Past Surgical History  Procedure Laterality Date  . Portacath placement  09/17/10    Tip in cavoatrial junction  . Laryngectomy    . Tracheal dilitation  07/16/2011    Procedure: TRACHEAL DILITATION;  Surgeon: Beckie Salts, MD;  Location: [facility];  Service: ENT;  Laterality: N/A;  dilation of tracheal stoma and replacement of stoma tube  . Foreign body removal bronchial  10/02/2011    Procedure: REMOVAL FOREIGN BODY BRONCHIAL;  Surgeon: Ruby Cola, MD;  Location: [facility];  Service: ENT;  Laterality: N/A;  . Esophagoscopy  06/21/2012    Procedure: ESOPHAGOSCOPY;  Surgeon: [name], MD;  Location: [facility];  Service: ENT;  Laterality: N/A;  . Tubal ligation  1982  . Colonoscopy N/A 11/15/2012    Procedure: COLONOSCOPY;  Surgeon: Lafayette Dragon, MD;  Location: [facility];  Service: Endoscopy;  Laterality: N/A;  . Breast surgery     Family History  Problem Relation Age of Onset  . Heart disease Mother   . Heart disease Father   . Heart disease Sister    History  Substance Use Topics  . Smoking status: Former Smoker -- 2.00 packs/day  for 20 years    Types: Cigarettes    Quit date: 09/17/2010  . Smokeless tobacco: Never Used  . Alcohol Use: No     Comment: Former drinker 6 months ago   OB History    No data available     Review of Systems  Constitutional: Negative for fever, chills, diaphoresis and unexpected weight change.  HENT: Positive for rhinorrhea. Negative for congestion, ear pain, postnasal drip, sinus pressure, sneezing, sore throat and trouble swallowing.   Respiratory: Positive for cough, chest tightness and shortness of breath. Negative for wheezing.        Reports hemoptysis over the past 4-5 days with clots.  Cardiovascular: Positive for chest pain. Negative for palpitations and leg  swelling.  Gastrointestinal: Negative for nausea, vomiting, abdominal pain and diarrhea.  Musculoskeletal: Negative for myalgias, joint swelling and arthralgias.  Skin: Negative for pallor, rash and wound.  Neurological: Positive for numbness (left arm which wakes her from sleeping.) and headaches. Negative for dizziness, syncope and weakness.  Hematological: Negative for adenopathy.  Psychiatric/Behavioral: Negative for confusion.      Allergies  Review of patient's allergies indicates no known allergies.  Home Medications   Prior to Admission medications   Medication Sig Start Date End Date Taking? Authorizing Provider  albuterol (PROVENTIL HFA;VENTOLIN HFA) 108 (90 BASE) MCG/ACT inhaler Inhale 2 puffs into the lungs every 4 (four) hours as needed for wheezing or shortness of breath.   Yes Historical Provider, MD  benzonatate (TESSALON) 200 MG capsule Take 1 capsule (200 mg total) by mouth 3 (three) times daily as needed for cough. 10/13/13  Yes Gerda Diss, DO  buPROPion (WELLBUTRIN XL) 150 MG 24 hr tablet Take 150 mg by mouth daily.   Yes Historical Provider, MD  cyclobenzaprine (FLEXERIL) 10 MG tablet Take 10 mg by mouth 2 (two) times daily as needed for muscle spasms.   Yes Historical Provider, MD  ibuprofen (ADVIL,MOTRIN) 800 MG tablet Take 1 tablet (800 mg total) by mouth every 8 (eight) hours as needed for moderate pain. 02/02/14  Yes Katheren Shams, DO  levothyroxine (SYNTHROID, LEVOTHROID) 100 MCG tablet Take 100 mcg by mouth daily before breakfast.   Yes Historical Provider, MD  lisinopril (PRINIVIL,ZESTRIL) 20 MG tablet Take 20 mg by mouth daily.   Yes Historical Provider, MD  loratadine (CLARITIN) 10 MG tablet Take 10 mg by mouth daily.   Yes Historical Provider, MD  MELATONIN PO Take 1 tablet by mouth at bedtime as needed (sleep).   Yes Historical Provider, MD  metoprolol tartrate (LOPRESSOR) 25 MG tablet Take 25 mg by mouth 2 (two) times daily.   Yes Historical Provider,  MD  omeprazole (PRILOSEC) 40 MG capsule Take 40 mg by mouth 2 (two) times daily as needed (acid reflux).   Yes Historical Provider, MD  ondansetron (ZOFRAN-ODT) 4 MG disintegrating tablet Take 4 mg by mouth every 8 (eight) hours as needed for nausea. 07/28/13  Yes Heath Lark, MD  buPROPion (WELLBUTRIN XL) 150 MG 24 hr tablet Take 2 tablets (300 mg total) by mouth daily. 01/18/14   Katheren Shams, DO  cyclobenzaprine (FLEXERIL) 10 MG tablet TAKE ONE TABLET BY MOUTH TWICE DAILY AS NEEDED FOR MUSCLE SPASM 01/18/14   Katheren Shams, DO  EQ LORATADINE 10 MG tablet TAKE ONE TABLET BY MOUTH ONCE DAILY 11/10/13   Minerva Ends, MD  EQ LORATADINE 10 MG tablet TAKE ONE TABLET BY MOUTH ONCE DAILY 01/18/14   Katheren Shams, DO  levofloxacin (LEVAQUIN) 500 MG tablet Take 1 tablet (500 mg total) by mouth daily. 10/13/13   Gerda Diss, DO  levothyroxine (SYNTHROID, LEVOTHROID) 100 MCG tablet TAKE ONE TABLET BY MOUTH ONCE DAILY BEFORE  BREAKFAST 01/18/14   Katheren Shams, DO  lisinopril (PRINIVIL,ZESTRIL) 20 MG tablet TAKE ONE TABLET BY MOUTH ONCE DAILY 01/18/14   Katheren Shams, DO  metoprolol tartrate (LOPRESSOR) 25 MG tablet TAKE ONE TABLET BY MOUTH TWICE DAILY 12/02/13   Josalyn C Funches, MD  omeprazole (PRILOSEC) 40 MG capsule TAKE ONE CAPSULE BY MOUTH IN THE MORNING. TAKE  AT LEAST  FOUR  HOURS  AFTER  TAKING  LEVOTHYROXINE. 11/10/13   Josalyn C Funches, MD  omeprazole (PRILOSEC) 40 MG capsule TAKE ONE CAPSULE BY MOUTH IN THE MORNING. TAKE  AT LEAST  FOUR  HOURS  AFTER  TAKING  LEVOTHYROXINE. 01/18/14   Katheren Shams, DO  oxyCODONE-acetaminophen (PERCOCET/ROXICET) 5-325 MG per tablet Take 1 tablet by mouth every 8 (eight) hours as needed for moderate pain or severe pain. 07/28/13   Heath Lark, MD  PROVENTIL HFA 108 (90 BASE) MCG/ACT inhaler INHALE ONE TO TWO PUFFS INTO LUNGS EVERY 4 HOURS AS NEEDED FOR WHEEZING OR SHORTNESS OF BREATH 04/13/14   Jazma Y Phelps, DO   BP 155/82 mmHg  Pulse 85  Temp(Src) 97.8 F (36.6  C) (Oral)  Resp 20  Ht 5\' 7"  (1.702 m)  Wt 205 lb (92.987 kg)  BMI 32.10 kg/m2  SpO2 100% Physical Exam  Constitutional: She is oriented to person, place, and time. She appears well-developed and well-nourished. No distress.  HENT:  Mouth/Throat: Oropharynx is clear and moist.  Eyes: Conjunctivae and EOM are normal. Pupils are equal, round, and reactive to light. No scleral icterus.  Neck: Neck supple. No JVD present. No tracheal deviation present.  Thyroid absent. Post-surgical tracheostomy scar without drainage or signs of infection.  Cardiovascular: Normal rate, regular rhythm and intact distal pulses.   Murmur (blowing SEM at RUSB II/VI) heard. Pulmonary/Chest: Effort normal and breath sounds normal. No stridor. No respiratory distress. She has no wheezes. She has no rales. She exhibits tenderness (tenderness to palpation localized over left upper chest).  Abdominal: Soft. Bowel sounds are normal. She exhibits no distension. There is no tenderness.  Musculoskeletal: Normal range of motion. She exhibits no edema or tenderness.  Lymphadenopathy:    She has no cervical adenopathy.  Neurological: She is alert and oriented to person, place, and time. She exhibits normal muscle tone.  Skin: Skin is warm and dry.  Vitals reviewed.   ED Course  Procedures (including critical care time) Labs Review Labs Reviewed  BASIC METABOLIC PANEL - Abnormal; Notable for the following:    GFR calc non Af Amer 63 (*)    GFR calc Af Amer 73 (*)    All other components within normal limits  CBC WITH DIFFERENTIAL - Abnormal; Notable for the following:    RBC 3.69 (*)    Hemoglobin 11.4 (*)    HCT 34.5 (*)    Neutrophils Relative % 42 (*)    Lymphocytes Relative 47 (*)    All other components within normal limits  I-STAT TROPOININ, ED    Imaging Review Dg Chest 2 View (if Patient Has Fever And/or Copd)  04/28/2014   CLINICAL DATA:  Shortness of breath.  EXAM: CHEST  2 VIEW  COMPARISON:   10/13/2013.  FINDINGS: Mediastinum and hilar structures are normal. Mild bibasilar subsegmental atelectasis. No pleural effusion or  pneumothorax. Heart size normal. No acute osseous abnormality.  IMPRESSION: Mild bibasilar subsegmental atelectasis, otherwise negative exam .   Electronically Signed   By: Marcello Moores  Register   On: 04/28/2014 07:45     EKG Interpretation   Date/Time:  Friday April 28 2014 06:44:15 EST Ventricular Rate:  101 PR Interval:  218 QRS Duration: 77 QT Interval:  378 QTC Calculation: 490 R Axis:   12 Text Interpretation:  Sinus tachycardia Multiform ventricular premature  complexes Prolonged PR interval Probable left atrial enlargement Low  voltage, precordial leads Probable anteroseptal infarct, old T wave  flattening, flipped T waves laterally, new Confirmed by Mingo Amber  MD, [place]  ([id number]) on 04/28/2014 7:05:17 AM      MDM   Final diagnoses:  Dyspnea   04/28/2014 7:03 AM 58 yo patient with significant pulmonary/airway pathology history presenting with hypoxemia, dyspnea and recent hemoptysis worrisome for PE vs. upper airway stricture vs. bronchiectasis vs. obstructive lung disease vs. tumor vs. NSTEMI/unstable angina. Initial clinical findings are benign, saturating 100% on ambient air without distress. Distant breath sounds without wheezing or rales. CXR without focal consolidation or stigmata of heart failure. Troponin negative. ECG with sinus tach and flattened T waves in lateral and inferior leads of unknown duration. Very mild anemia which is exactly at her baseline. Renal function is adequate and DVT probability is moderate so will proceed with CTA chest for PE rule out.   04/28/2014 9:14 AM  CTA showing no PE, infiltrate, bronchogenic tumor or obstructing mass. Has peribronchial thickening consistent with bronchitis with subsegmental atelectasis bilaterally. Still with dyspnea at rest despite bronchodilators.  04/28/2014 9:35 AM  Will treat with IV  steroids for presumed acute bronchitis. After discussion at length with patient she continues to feel very dyspneic and had an episode of hypoxemia. She lives alone and is incredibly reluctant to return home. Will admit for observation given worry for possible respiratory decompensation.  Patrecia Pour, MD 04/28/14 [place], MD 04/28/14 [phone]

## 2014-04-29 DIAGNOSIS — I359 Nonrheumatic aortic valve disorder, unspecified: Secondary | ICD-10-CM

## 2014-04-29 DIAGNOSIS — J441 Chronic obstructive pulmonary disease with (acute) exacerbation: Secondary | ICD-10-CM | POA: Diagnosis present

## 2014-04-29 LAB — TROPONIN I

## 2014-04-29 MED ORDER — PREDNISONE 50 MG PO TABS
50.0000 mg | ORAL_TABLET | Freq: Every day | ORAL | Status: DC
Start: 2014-04-29 — End: 2014-04-30
  Administered 2014-04-29 – 2014-04-30 (×2): 50 mg via ORAL
  Filled 2014-04-29 (×3): qty 1

## 2014-04-29 MED ORDER — CETYLPYRIDINIUM CHLORIDE 0.05 % MT LIQD
7.0000 mL | Freq: Two times a day (BID) | OROMUCOSAL | Status: DC
  Administered 2014-04-29 – 2014-04-30 (×3): 7 mL via OROMUCOSAL

## 2014-04-29 NOTE — Progress Notes (Signed)
  Echocardiogram 2D Echocardiogram has been performed.  Deborah [name] 04/29/2014, 10:08 AM

## 2014-04-29 NOTE — Progress Notes (Signed)
RT Note: Called for pt desat in 60's. Adjusted pulse ox on ear SpO2 97%. Changed ATC from air to 28% O2.

## 2014-04-29 NOTE — Progress Notes (Signed)
Family Medicine Teaching Service Daily Progress Note Intern Pager: [phone]  Patient name: Deborah [name] record number: [id number] Date of birth: [birth year] Age: 58 y.o. Gender: female  Primary Care Provider: Diona Fanti, DO Consultants: none Code Status: DNR (confirmed on admission)  Pt Overview and Major Events to Date:  04/28/14 - admitted for dyspnea, CTA with likely bronchitis (negative PE), treated as COPD exac 04/29/14 - Ruled out MI (neg trop x3, EKG T-wave inversions resolved on repeat), clinically improved  Assessment and Plan:  Deborah [name] is a 58 y.o. female presenting with dyspnea, considered COPD exac. PMH is significant for laryngeal cancer currently with tracheostomy, hypothyroidism, HTN, former smoker.   # Suspected COPD exacerbation: - CTA (negative PE, suggestive of bronchitis). Improved following Duonebs, steroids and antibiotics, although without significant wheezing on admission. Otherwise, possible etiologies with some associated CP, considered ACS (ruled out MI with neg troponin x 3 and repeat EKG negative NSR with resolved lateral T-wave inversions). Unlikely new dx CHF (ProBNP 38.9, pending ECHO) - Currently stable vitals, o/n did req supplemental O2 due to desat to 60% on RTC (unclear if related to pulse oximeter), pt with some dyspnea and CP quickly resolved - Remain on telemetry - Supplemental O2 PRN, sats >92%, on trach collar with humidified air - ECHO pending - Continue Azithro 500mg  daily x 5 days total - Continue steroids, Prednisone PO 50mg  x 4 more days, (s/p solumedrol x 1 dose) - Not on maintenance therapy, anticipate monitoring in outpatient setting, but likely not start at this time  # Hypothyroid: last TSH was 7.93 in April but free T4 normal so changes not made. - continue home dose - Recommend repeat f/u TSH (4-6 weeks later) once discharged and stable resp status  # HTN: - Improved - continue lisinopril &  metoprolol  # Psych: - continue wellbutrin  FEN/GI: heart healthy diet, saline lock IV Prophylaxis: SQ heparin  Disposition: Inpatient, treated as COPD exac with improvement on abx, steroids, and nebs. Still with some dyspnea, ruled out MI. Goal to maintain O2 sat overnight without further desat. Anticipate discharge in 1-2 days pending further clinical improvement.  Subjective:  Resting in bed, ate most of breakfast. Currently on trach collar with humidified air and O2. Reports overall breathing improved (on treatments), still admits to dyspnea. Denies active chest pain or new complaints. Concerned about episode of dyspnea and brief CP last night with some chest tightness, since resolved has not returned.  Objective: Temp:  [98.1 F (36.7 C)-98.7 F (37.1 C)] 98.5 F (36.9 C) (11/14 0446) Pulse Rate:  [69-107] 83 (11/14 0734) Resp:  [9-25] 18 (11/14 0734) BP: (146-192)/(84-124) 147/88 mmHg (11/14 0446) SpO2:  [92 %-100 %] 99 % (11/14 0734) Weight:  [208 lb 9.6 oz (94.62 kg)] 208 lb 9.6 oz (94.62 kg) (11/14 0446) Physical Exam: General: chronically-ill, pleasant, trach collar, conversant, NAD Cardiovascular: RRR, S1 / S2, no murmurs Respiratory: Improved breath sounds, with reduced bibasilar crackles. No wheezing heard. Normal work of breathing. Abdomen: soft, NTND Extremities: moves all ext, no lower ext edema, intact +2 peripheral pulses  Laboratory:  Recent Labs Lab 04/28/14 0658  WBC 5.0  HGB 11.4*  HCT 34.5*  PLT 335    Recent Labs Lab 04/28/14 0658  NA 144  K 3.8  CL 105  CO2 24  BUN 13  CREATININE 0.98  CALCIUM 9.1  GLUCOSE 81   ProBnp 38.9  Troponin-I - negative x 3  Imaging/Diagnostic Tests:  CTA Chest: No  evidence of pulmonary embolism.  Bronchitic changes with scattered subsegmental atelectasis in BILATERAL lower lobes, RIGHT middle lobe and anteromedial RIGHT upper lobe.  Slight increase in size of a LEFT axillary in lymph  node.  Small pericardial effusion and cardiac enlargement.  EKG 11/13 (admission) - sinus tach (101), no ST elevation, significant for lateral T wave inversions / flip 11/14 (repeat) - NSR (HR 86), no ST elevation, resolved T-wave inversions, otherwise no acute changes   Deborah Putnam, DO 04/29/2014, 9:21 AM PGY-2, [place] Intern pager: [phone], text pages welcome

## 2014-04-30 DIAGNOSIS — E039 Hypothyroidism, unspecified: Secondary | ICD-10-CM

## 2014-04-30 DIAGNOSIS — I10 Essential (primary) hypertension: Secondary | ICD-10-CM

## 2014-04-30 DIAGNOSIS — R0600 Dyspnea, unspecified: Secondary | ICD-10-CM

## 2014-04-30 DIAGNOSIS — J441 Chronic obstructive pulmonary disease with (acute) exacerbation: Principal | ICD-10-CM

## 2014-04-30 MED ORDER — AZITHROMYCIN 500 MG PO TABS: 500.0000 mg | ORAL_TABLET | Freq: Every day | ORAL | Status: DC

## 2014-04-30 MED ORDER — ALBUTEROL SULFATE HFA 108 (90 BASE) MCG/ACT IN AERS: 2.0000 | INHALATION_SPRAY | RESPIRATORY_TRACT | Status: DC | PRN

## 2014-04-30 MED ORDER — GUAIFENESIN ER 600 MG PO TB12: 600.0000 mg | ORAL_TABLET | Freq: Two times a day (BID) | ORAL | Status: DC

## 2014-04-30 MED ORDER — PREDNISONE 50 MG PO TABS: 50.0000 mg | ORAL_TABLET | Freq: Every day | ORAL | Status: DC

## 2014-04-30 MED ORDER — IBUPROFEN 800 MG PO TABS: 800.0000 mg | ORAL_TABLET | Freq: Three times a day (TID) | ORAL | Status: DC | PRN

## 2014-04-30 MED ORDER — BENZONATATE 200 MG PO CAPS: 200.0000 mg | ORAL_CAPSULE | Freq: Three times a day (TID) | ORAL | Status: DC | PRN

## 2014-04-30 NOTE — Discharge Instructions (Signed)
Take the new medications listed in your med summary (azithromycin, prednisone, guaifenesin). The medications listed as "change" are the same as the ones you were taking on arrival. Our computer system makes it look like these are changing but if you review the doses, you will see they are the same.   Acute Bronchitis Bronchitis is inflammation of the airways that extend from the windpipe into the lungs (bronchi). The inflammation often causes mucus to develop. This leads to a cough, which is the most common symptom of bronchitis.  In acute bronchitis, the condition usually develops suddenly and goes away over time, usually in a couple weeks. Smoking, allergies, and asthma can make bronchitis worse. Repeated episodes of bronchitis may cause further lung problems.  CAUSES Acute bronchitis is most often caused by the same virus that causes a cold. The virus can spread from person to person (contagious) through coughing, sneezing, and touching contaminated objects. SIGNS AND SYMPTOMS   Cough.   Fever.   Coughing up mucus.   Body aches.   Chest congestion.   Chills.   Shortness of breath.   Sore throat.  DIAGNOSIS  Acute bronchitis is usually diagnosed through a physical exam. Your health care provider will also ask you questions about your medical history. Tests, such as chest X-rays, are sometimes done to rule out other conditions.  TREATMENT  Acute bronchitis usually goes away in a couple weeks. Oftentimes, no medical treatment is necessary. Medicines are sometimes given for relief of fever or cough. Antibiotic medicines are usually not needed but may be prescribed in certain situations. In some cases, an inhaler may be recommended to help reduce shortness of breath and control the cough. A cool mist vaporizer may also be used to help thin bronchial secretions and make it easier to clear the chest.  HOME CARE INSTRUCTIONS  Get plenty of rest.   Drink enough fluids to keep  your urine clear or pale yellow (unless you have a medical condition that requires fluid restriction). Increasing fluids may help thin your respiratory secretions (sputum) and reduce chest congestion, and it will prevent dehydration.   Take medicines only as directed by your health care provider.  If you were prescribed an antibiotic medicine, finish it all even if you start to feel better.  Avoid smoking and secondhand smoke. Exposure to cigarette smoke or irritating chemicals will make bronchitis worse. If you are a smoker, consider using nicotine gum or skin patches to help control withdrawal symptoms. Quitting smoking will help your lungs heal faster.   Reduce the chances of another bout of acute bronchitis by washing your hands frequently, avoiding people with cold symptoms, and trying not to touch your hands to your mouth, nose, or eyes.   Keep all follow-up visits as directed by your health care provider.  SEEK MEDICAL CARE IF: Your symptoms do not improve after 1 week of treatment.  SEEK IMMEDIATE MEDICAL CARE IF:  You develop an increased fever or chills.   You have chest pain.   You have severe shortness of breath.  You have bloody sputum.   You develop dehydration.  You faint or repeatedly feel like you are going to pass out.  You develop repeated vomiting.  You develop a severe headache. MAKE SURE YOU:   Understand these instructions.  Will watch your condition.  Will get help right away if you are not doing well or get worse. Document Released: 07/10/2004 Document Revised: 10/17/2013 Document Reviewed: 11/23/2012 [facility] Patient Information 2015 [place], [place].  This information is not intended to replace advice given to you by your health care provider. Make sure you discuss any questions you have with your health care provider. ° °

## 2014-04-30 NOTE — Progress Notes (Signed)
DC IV and tele per MD orders and protocol.  Rowe Pavy, RN

## 2014-04-30 NOTE — Discharge Summary (Signed)
[facility] Discharge Summary  Patient name: Deborah [name] record number: [id number] Date of birth: Sep 06, 1955 Age: 58 y.o. Gender: female Date of Admission: 04/28/2014  Date of Discharge: 04/30/14 Admitting Physician: Deborah Dawn, MD  Primary Care Provider: Diona Fanti, DO Consultants: None  Indication for Hospitalization: SOB  Discharge Diagnoses/Problem List:  COPD exacerbation History of laryngeal cancer Hypertension  Hypothyroidism Depression  Disposition: Home (has daughter to assist in her care)  Discharge Condition: Stable, improved  Discharge Exam:  Blood pressure 123/76, pulse 64, temperature 98.7 F (37.1 C), temperature source Oral, resp. rate 16, height 5\' 7"  (1.702 m), weight 207 lb 1.6 oz (93.94 kg), SpO2 100 %. General: [name], trach collar with humidifier in place in NAD. Able to communicate  Cardiovascular: RRR, S1 / S2, no murmurs, rubs, or gallops noted Respiratory: CTAB. No wheezing, crackles, or rhonchi  heard. Normal work of breathing. Abdomen: +BS, soft, NTND Extremities: moves all ext, no lower ext edema, intact +2 peripheral pulses  Brief Hospital Course:  Deborah [name] is 58 y/o female with PMH laryngeal cancer s/p tracheostomy, hypothyroidism, HTN, former smoker presenting with worsening SOB over past few weeks, increased need for humidified air over her trach as well as increased used of albuterol (2-3 times per day, at baseline she does not need humidified air and only uses albuterol a few times per week), with a mildly productive cough with no associated fevers or chills.  In the ED, a CTA was obtained given concerns for PE which was negative, however did reveal signs of bronchitis. MI was ruled out with neg troponin x 3 and stable EKG with NSR without signs of ischemia/infarct. He was given IV Solu-Medrol.  Although she has never had a formal diagnosis of COPD, she was treated as a COPD  exacerbation and continued on prednisone 50mg , Duonebs q6hrs and azithromcyin 500mg  QD. To r/o new onset CHF a pBNP was 38.9 and an echocardiogram was performed which revealed an EF of 55-60% without mention of diastolic function. On the day prior to discharge, she required daytime humidified air (at baseline she only requires this qHS), however on the the day of discharge she was satting well on RA without the humidifier and felt her SOB had resolved. She was discharged with 2 more doses of prednisone as well as 2 more doses of azithromycin 500mg  to complete a 5 day course of both.   Issues for Follow Up:  -- Last TSH in April was 7.93 however free T4 was normal; consider repeat TSH in 4-6 weeks   Significant Procedures: None  Significant Labs and Imaging:   Recent Labs Lab 04/28/14 0658  WBC 5.0  HGB 11.4*  HCT 34.5*  PLT 335    Recent Labs Lab 04/28/14 0658  NA 144  K 3.8  CL 105  CO2 24  GLUCOSE 81  BUN 13  CREATININE 0.98  CALCIUM 9.1  ProBnp 38.9 Troponin-I - negative x 3  CTA Chest: No evidence of pulmonary embolism.  Bronchitic changes with scattered subsegmental atelectasis in BILATERAL lower lobes, RIGHT middle lobe and anteromedial RIGHT upper lobe.  Slight increase in size of a LEFT axillary in lymph node.  Small pericardial effusion and cardiac enlargement.  EKG 11/13 (admission) - sinus tach (101), no ST elevation, significant for lateral T wave inversions / flip 11/14 (repeat) - NSR (HR 86), no ST elevation, resolved T-wave inversions, otherwise no acute changes \  TTE: Mild focal basal hypertrophy of  the septum. EF 55-60%. Moderate AV regurgitation.   Results/Tests Pending at Time of Discharge: None  Discharge Medications:    Medication List    TAKE these medications        azithromycin 500 MG tablet  Commonly known as:  ZITHROMAX  Take 1 tablet (500 mg total) by mouth daily. On 11/16 and 11/17     benzonatate 200 MG capsule  Commonly  known as:  TESSALON  Take 1 capsule (200 mg total) by mouth 3 (three) times daily as needed for cough.     benzonatate 200 MG capsule  Commonly known as:  TESSALON  Take 1 capsule (200 mg total) by mouth 3 (three) times daily as needed for cough.     buPROPion 150 MG 24 hr tablet  Commonly known as:  WELLBUTRIN XL  Take 2 tablets (300 mg total) by mouth daily.     cyclobenzaprine 10 MG tablet  Commonly known as:  FLEXERIL  TAKE ONE TABLET BY MOUTH TWICE DAILY AS NEEDED FOR MUSCLE SPASM     loratadine 10 MG tablet  Commonly known as:  CLARITIN  Take 10 mg by mouth daily.     EQ LORATADINE 10 MG tablet  Generic drug:  loratadine  TAKE ONE TABLET BY MOUTH ONCE DAILY     guaiFENesin 600 MG 12 hr tablet  Commonly known as:  MUCINEX  Take 1 tablet (600 mg total) by mouth 2 (two) times daily.     ibuprofen 800 MG tablet  Commonly known as:  ADVIL,MOTRIN  Take 1 tablet (800 mg total) by mouth every 8 (eight) hours as needed for moderate pain.     ibuprofen 800 MG tablet  Commonly known as:  ADVIL,MOTRIN  Take 1 tablet (800 mg total) by mouth every 8 (eight) hours as needed for moderate pain.     levothyroxine 100 MCG tablet  Commonly known as:  SYNTHROID, LEVOTHROID  TAKE ONE TABLET BY MOUTH ONCE DAILY BEFORE  BREAKFAST     lisinopril 20 MG tablet  Commonly known as:  PRINIVIL,ZESTRIL  TAKE ONE TABLET BY MOUTH ONCE DAILY     MELATONIN PO  Take 1 tablet by mouth at bedtime as needed (sleep).     metoprolol tartrate 25 MG tablet  Commonly known as:  LOPRESSOR  TAKE ONE TABLET BY MOUTH TWICE DAILY     omeprazole 40 MG capsule  Commonly known as:  PRILOSEC  TAKE ONE CAPSULE BY MOUTH IN THE MORNING. TAKE  AT LEAST  FOUR  HOURS  AFTER  TAKING  LEVOTHYROXINE.     ondansetron 4 MG disintegrating tablet  Commonly known as:  ZOFRAN-ODT  Take 4 mg by mouth every 8 (eight) hours as needed for nausea.     oxyCODONE-acetaminophen 5-325 MG per tablet  Commonly known as:   PERCOCET/ROXICET  Take 1 tablet by mouth every 8 (eight) hours as needed for moderate pain or severe pain.     predniSONE 50 MG tablet  Commonly known as:  DELTASONE  Take 1 tablet (50 mg total) by mouth daily with breakfast. On 11/16 and 11/17     albuterol 108 (90 BASE) MCG/ACT inhaler  Commonly known as:  PROVENTIL HFA;VENTOLIN HFA  Inhale 2 puffs into the lungs every 4 (four) hours as needed for wheezing or shortness of breath.     PROVENTIL HFA 108 (90 BASE) MCG/ACT inhaler  Generic drug:  albuterol  INHALE ONE TO TWO PUFFS INTO LUNGS EVERY 4 HOURS AS NEEDED FOR WHEEZING  OR SHORTNESS OF BREATH     albuterol 108 (90 BASE) MCG/ACT inhaler  Commonly known as:  PROVENTIL HFA;VENTOLIN HFA  Inhale 2 puffs into the lungs every 4 (four) hours as needed for wheezing or shortness of breath.        Discharge Instructions: Please refer to Patient Instructions section of EMR for full details.  Patient was counseled important signs and symptoms that should prompt return to medical care, changes in medications, dietary instructions, activity restrictions, and follow up appointments.   Follow-Up Appointments: Follow-up Information    Follow up with Deborah Fanti, DO. Call in 1 day.   Specialty:  Family Medicine   Why:  to make follow up appointment with your primary care physician   Contact information:   [address] [place] [address] [phone]       Archie Patten, MD 04/30/2014, 4:39 PM PGY-1, [place]

## 2014-04-30 NOTE — Progress Notes (Signed)
DC instructions reviewed with patient; no further questions.  Rowe Pavy, RN

## 2014-05-08 ENCOUNTER — Ambulatory Visit (INDEPENDENT_AMBULATORY_CARE_PROVIDER_SITE_OTHER): Payer: Medicaid Other | Admitting: Family Medicine

## 2014-05-08 ENCOUNTER — Encounter: Payer: Self-pay | Admitting: Family Medicine

## 2014-05-08 VITALS — BP 163/96 | HR 72 | Ht 67.0 in | Wt 216.0 lb

## 2014-05-08 DIAGNOSIS — I10 Essential (primary) hypertension: Secondary | ICD-10-CM

## 2014-05-08 DIAGNOSIS — J441 Chronic obstructive pulmonary disease with (acute) exacerbation: Secondary | ICD-10-CM

## 2014-05-08 NOTE — Progress Notes (Signed)
    Subjective:    Patient ID: Deborah [name] is a 58 y.o. female presenting with Hospitalization Follow-up  on 05/08/2014  HPI: Here for hospital f/u. Feels much better. Breathing is improved. Reports appetite is improved.  Reports taking her BP meds everyday. Trying to work on weight loss.  Review of Systems  Constitutional: Negative for fever and chills.  Respiratory: Negative for shortness of breath (some dyspena on exertion).   Cardiovascular: Negative for chest pain.  Gastrointestinal: Negative for nausea, vomiting and abdominal pain.  Genitourinary: Negative for dysuria.  Skin: Negative for rash.      Objective:    BP 163/96 mmHg  Pulse 72  Ht 5\' 7"  (1.702 m)  Wt 216 lb (97.977 kg)  BMI 33.82 kg/m2  SpO2 98% Physical Exam  Constitutional: She is oriented to person, place, and time. She appears well-developed and well-nourished. No distress.  HENT:  Head: Normocephalic and atraumatic.  Eyes: No scleral icterus.  Neck: Neck supple.  Cardiovascular: Normal rate.   Pulmonary/Chest: Effort normal.  Occasional rhonchus breath sound noted.  Abdominal: Soft.  Neurological: She is alert and oriented to person, place, and time.  Skin: Skin is warm and dry.  Psychiatric: She has a normal mood and affect.        Assessment & Plan:   Problem List Items Addressed This Visit      Unprioritized   HTN (hypertension)    BP is up today, but usually better, however, it normally looks much better.    COPD exacerbation - Primary    Recovering from acute exacerbation. Will monitor, may need some Spiriva or other long-term medication.  Consider addition of Atrovent.        Return in about 3 months (around 08/08/2014).

## 2014-05-08 NOTE — Patient Instructions (Signed)

## 2014-05-08 NOTE — Assessment & Plan Note (Signed)
BP is up today, but usually better, however, it normally looks much better.

## 2014-05-08 NOTE — Assessment & Plan Note (Signed)
Recovering from acute exacerbation. Will monitor, may need some Spiriva or other long-term medication.  Consider addition of Atrovent.

## 2014-05-16 ENCOUNTER — Ambulatory Visit (HOSPITAL_COMMUNITY)
Admission: RE | Admit: 2014-05-16 | Discharge: 2014-05-16 | Disposition: A | Discharge status: Home / Self Care | Payer: Medicaid Other | Source: Ambulatory Visit | Attending: Family Medicine | Admitting: Family Medicine

## 2014-05-16 ENCOUNTER — Ambulatory Visit (INDEPENDENT_AMBULATORY_CARE_PROVIDER_SITE_OTHER): Payer: Medicaid Other | Admitting: Family Medicine

## 2014-05-16 ENCOUNTER — Encounter: Payer: Self-pay | Admitting: Family Medicine

## 2014-05-16 ENCOUNTER — Telehealth: Payer: Self-pay | Admitting: Family Medicine

## 2014-05-16 VITALS — BP 118/75 | HR 93 | Temp 98.3°F | Wt 212.0 lb

## 2014-05-16 DIAGNOSIS — R0602 Shortness of breath: Secondary | ICD-10-CM | POA: Diagnosis not present

## 2014-05-16 DIAGNOSIS — J441 Chronic obstructive pulmonary disease with (acute) exacerbation: Secondary | ICD-10-CM

## 2014-05-16 MED ORDER — BENZONATATE 100 MG PO CAPS: 100.0000 mg | ORAL_CAPSULE | Freq: Two times a day (BID) | ORAL | Status: DC | PRN

## 2014-05-16 MED ORDER — MOMETASONE FURO-FORMOTEROL FUM 200-5 MCG/ACT IN AERO: 2.0000 | INHALATION_SPRAY | Freq: Two times a day (BID) | RESPIRATORY_TRACT | Status: DC

## 2014-05-16 NOTE — Progress Notes (Signed)
Patient ID: Deborah [name], female   DOB: 1956-03-27, 58 y.o.   MRN: [id number]   [facility] Deborah Bell, MD Phone: [phone]  Subjective:  Deborah [name] is a 58 y.o F who presents for dyspnea f/up. Was recently in hospital for COPD exacerbation, discharged on 04/30/2014.   Since last Wednesday felt significantly SOB with a lot of coughing and discomfort. Felt that she had fever last night but did not take temperature. Additionally felt nauseated but no vomiting. Denies chest pain headaches dizziness confusion, diarrhea, dysuria.   All relevant systems were reviewed and were negative unless otherwise noted in the HPI  Past Medical History Reviewed problem list.  Medications- reviewed and updated Current Outpatient Prescriptions  Medication Sig Dispense Refill  . albuterol (PROVENTIL HFA;VENTOLIN HFA) 108 (90 BASE) MCG/ACT inhaler Inhale 2 puffs into the lungs every 4 (four) hours as needed for wheezing or shortness of breath. 1 Inhaler 1  . buPROPion (WELLBUTRIN XL) 150 MG 24 hr tablet Take 2 tablets (300 mg total) by mouth daily. 60 tablet 4  . cyclobenzaprine (FLEXERIL) 10 MG tablet TAKE ONE TABLET BY MOUTH TWICE DAILY AS NEEDED FOR MUSCLE SPASM 30 tablet 2  . EQ LORATADINE 10 MG tablet TAKE ONE TABLET BY MOUTH ONCE DAILY 30 tablet 0  . ibuprofen (ADVIL,MOTRIN) 800 MG tablet Take 1 tablet (800 mg total) by mouth every 8 (eight) hours as needed for moderate pain. 60 tablet 0  . levothyroxine (SYNTHROID, LEVOTHROID) 100 MCG tablet TAKE ONE TABLET BY MOUTH ONCE DAILY BEFORE  BREAKFAST 30 tablet 4  . lisinopril (PRINIVIL,ZESTRIL) 20 MG tablet TAKE ONE TABLET BY MOUTH ONCE DAILY 30 tablet 4  . loratadine (CLARITIN) 10 MG tablet Take 10 mg by mouth daily.    [name] MELATONIN PO Take 1 tablet by mouth at bedtime as needed (sleep).    . metoprolol tartrate (LOPRESSOR) 25 MG tablet TAKE ONE TABLET BY MOUTH TWICE DAILY 60 tablet 0  . omeprazole (PRILOSEC) 40 MG capsule  TAKE ONE CAPSULE BY MOUTH IN THE MORNING. TAKE  AT LEAST  FOUR  HOURS  AFTER  TAKING  LEVOTHYROXINE. 30 capsule 0  . ondansetron (ZOFRAN-ODT) 4 MG disintegrating tablet Take 4 mg by mouth every 8 (eight) hours as needed for nausea.     No current facility-administered medications for this visit.   Chief complaint-noted No additions to family history Social history- patient is a former smoker  Objective: BP 118/75 mmHg  Pulse 93  Temp(Src) 98.3 F (36.8 C) (Oral)  Wt 212 lb (96.163 kg)  SpO2 95% Gen: NAD, alert, cooperative with exam HEENT: NCAT, trach'd chronically  Neck: FROM, supple CV: RRR, good S1/S2, no murmur, cap refill <3 Resp: initially diminished in bilateral bases with some crackles; s/p 1 hr and dulera pt with full inspiratory/expiratory phases  Neuro: Alert and oriented, No gross deficits Skin: no rashes no lesions  Assessment/Plan: See problem based a/p

## 2014-05-16 NOTE — Telephone Encounter (Signed)
LM for patient on number listed on ROI form with results of chest xray. Graesyn Schreifels,CMA

## 2014-05-16 NOTE — Assessment & Plan Note (Signed)
Suspect SOB related to untreated COPD Given sample dulera in house CXR ordered to assess for infiltrate but doubt given nml vitals and SpO2 Will f/up with pt in 1 weeks time Tessalon pearls for cough  Albuterol prn

## 2014-05-16 NOTE — Telephone Encounter (Signed)
CXR has actually improved  Attempted to call all numbers in chart [facility], MD

## 2014-05-16 NOTE — Patient Instructions (Signed)
It was great to meet you today!  I am sorry that you are not feeling well  Please start dulera twice a day every day no matter how you feel  You may use albuterol on top of this is you are having significant shortness of breath  I will call you if the results of the testing looks abnormal   Please return to clinic if symptoms do not improve or worsen Make appointment for 1 week from now Feel better soon Bernadene Bell, MD

## 2014-05-23 ENCOUNTER — Encounter: Payer: Self-pay | Admitting: Family Medicine

## 2014-05-23 ENCOUNTER — Ambulatory Visit (INDEPENDENT_AMBULATORY_CARE_PROVIDER_SITE_OTHER): Payer: Medicaid Other | Admitting: Family Medicine

## 2014-05-23 VITALS — BP 122/76 | HR 81 | Temp 98.6°F | Resp 20 | Wt 213.0 lb

## 2014-05-23 DIAGNOSIS — J441 Chronic obstructive pulmonary disease with (acute) exacerbation: Secondary | ICD-10-CM

## 2014-05-23 MED ORDER — MOMETASONE FURO-FORMOTEROL FUM 200-5 MCG/ACT IN AERO: 2.0000 | INHALATION_SPRAY | Freq: Two times a day (BID) | RESPIRATORY_TRACT | Status: DC

## 2014-05-23 NOTE — Progress Notes (Signed)
Patient ID: Deborah [name], female   DOB: 03/23/1956, 58 y.o.   MRN: [id number]   [facility] Deborah Bell, MD Phone: [phone]  Subjective:  Deborah [name] is a 58 y.o F who presents for dyspnea f/up. Was seen by me on 05/16/2014. Started on [place] (which is much more affordable) presents for f/up. Doing much better. Using dulera every day (2puffs BID). No difficulties taking medicine. Denies chest pain headaches dizziness confusion, diarrhea, dysuria.   All relevant systems were reviewed and were negative unless otherwise noted in the HPI  Past Medical History Reviewed problem list.  Medications- reviewed and updated Current Outpatient Prescriptions  Medication Sig Dispense Refill  . albuterol (PROVENTIL HFA;VENTOLIN HFA) 108 (90 BASE) MCG/ACT inhaler Inhale 2 puffs into the lungs every 4 (four) hours as needed for wheezing or shortness of breath. 1 Inhaler 1  . benzonatate (TESSALON) 100 MG capsule Take 1 capsule (100 mg total) by mouth 2 (two) times daily as needed for cough. 20 capsule 0  . buPROPion (WELLBUTRIN XL) 150 MG 24 hr tablet Take 2 tablets (300 mg total) by mouth daily. 60 tablet 4  . cyclobenzaprine (FLEXERIL) 10 MG tablet TAKE ONE TABLET BY MOUTH TWICE DAILY AS NEEDED FOR MUSCLE SPASM 30 tablet 2  . EQ LORATADINE 10 MG tablet TAKE ONE TABLET BY MOUTH ONCE DAILY 30 tablet 0  . ibuprofen (ADVIL,MOTRIN) 800 MG tablet Take 1 tablet (800 mg total) by mouth every 8 (eight) hours as needed for moderate pain. 60 tablet 0  . levothyroxine (SYNTHROID, LEVOTHROID) 100 MCG tablet TAKE ONE TABLET BY MOUTH ONCE DAILY BEFORE  BREAKFAST 30 tablet 4  . lisinopril (PRINIVIL,ZESTRIL) 20 MG tablet TAKE ONE TABLET BY MOUTH ONCE DAILY 30 tablet 4  . loratadine (CLARITIN) 10 MG tablet Take 10 mg by mouth daily.    [name] MELATONIN PO Take 1 tablet by mouth at bedtime as needed (sleep).    . metoprolol tartrate (LOPRESSOR) 25 MG tablet TAKE ONE TABLET BY  MOUTH TWICE DAILY 60 tablet 0  . mometasone-formoterol (DULERA) 200-5 MCG/ACT AERO Inhale 2 puffs into the lungs 2 (two) times daily. 1 Inhaler 0  . omeprazole (PRILOSEC) 40 MG capsule TAKE ONE CAPSULE BY MOUTH IN THE MORNING. TAKE  AT LEAST  FOUR  HOURS  AFTER  TAKING  LEVOTHYROXINE. 30 capsule 0  . ondansetron (ZOFRAN-ODT) 4 MG disintegrating tablet Take 4 mg by mouth every 8 (eight) hours as needed for nausea.     No current facility-administered medications for this visit.   Chief complaint-noted No additions to family history Social history- patient is a former smoker  Objective: BP 122/76 mmHg  Pulse 81  Temp(Src) 98.6 F (37 C) (Oral)  Resp 20  Wt 213 lb (96.616 kg)  SpO2 99% Gen: NAD, alert, cooperative with exam HEENT: NCAT, trach'd chronically  Neck: FROM, supple CV: RRR, good S1/S2, no murmur, cap refill <3 Resp: full inspiratory/expiratory phases; no wheezing or increased WOB  Neuro: Alert and oriented, No gross deficits Skin: no rashes no lesions  Assessment/Plan: See problem based a/p

## 2014-05-23 NOTE — Assessment & Plan Note (Signed)
Improved on dulera sample Cont medicine with claritin as needed for allergic rhinitis component  Expect pt to cont to do well with this regimen F/up in 2-3 months or sooner as needed

## 2014-05-23 NOTE — Patient Instructions (Signed)
Ms Seres, It was great to see you today! I am SO glad to hear that you are feeling better.  Please continue the dulera twice a day no matter how you are feeling. You may also use your cough medicine as needed for control  We will see you at the beginning of the next year! Happy holidays Bernadene Bell, MD

## 2014-06-07 ENCOUNTER — Other Ambulatory Visit: Payer: Self-pay | Admitting: Family Medicine

## 2014-06-07 DIAGNOSIS — J441 Chronic obstructive pulmonary disease with (acute) exacerbation: Secondary | ICD-10-CM

## 2014-06-07 MED ORDER — MOMETASONE FURO-FORMOTEROL FUM 200-5 MCG/ACT IN AERO: 2.0000 | INHALATION_SPRAY | Freq: Two times a day (BID) | RESPIRATORY_TRACT | Status: DC

## 2014-06-07 NOTE — Telephone Encounter (Signed)
Pt called and needs a refill on her new inhaler medication. jw

## 2014-06-26 ENCOUNTER — Telehealth: Payer: Self-pay | Admitting: Hematology and Oncology

## 2014-06-26 NOTE — Telephone Encounter (Signed)
s.w pt and advised on 2.12 appt moved to 2.19 per MD on Pal..Marland KitchenMarland KitchenMarland Kitchenpt ok adn aware of new d.t

## 2014-07-09 ENCOUNTER — Other Ambulatory Visit: Payer: Self-pay | Admitting: Obstetrics and Gynecology

## 2014-07-28 ENCOUNTER — Ambulatory Visit: Payer: Self-pay | Admitting: Hematology and Oncology

## 2014-07-28 ENCOUNTER — Other Ambulatory Visit: Payer: Self-pay

## 2014-08-03 ENCOUNTER — Other Ambulatory Visit: Payer: Self-pay | Admitting: Hematology and Oncology

## 2014-08-03 DIAGNOSIS — C321 Malignant neoplasm of supraglottis: Secondary | ICD-10-CM

## 2014-08-03 DIAGNOSIS — E038 Other specified hypothyroidism: Secondary | ICD-10-CM

## 2014-08-04 ENCOUNTER — Other Ambulatory Visit: Payer: Self-pay | Admitting: *Deleted

## 2014-08-04 ENCOUNTER — Other Ambulatory Visit (HOSPITAL_BASED_OUTPATIENT_CLINIC_OR_DEPARTMENT_OTHER): Payer: Medicaid Other

## 2014-08-04 ENCOUNTER — Ambulatory Visit (HOSPITAL_BASED_OUTPATIENT_CLINIC_OR_DEPARTMENT_OTHER): Payer: Medicaid Other | Admitting: Hematology and Oncology

## 2014-08-04 ENCOUNTER — Encounter: Payer: Self-pay | Admitting: Hematology and Oncology

## 2014-08-04 VITALS — BP 148/81 | HR 104 | Temp 98.4°F | Resp 20 | Ht 67.0 in | Wt 221.8 lb

## 2014-08-04 DIAGNOSIS — E038 Other specified hypothyroidism: Secondary | ICD-10-CM

## 2014-08-04 DIAGNOSIS — C321 Malignant neoplasm of supraglottis: Secondary | ICD-10-CM

## 2014-08-04 DIAGNOSIS — R635 Abnormal weight gain: Secondary | ICD-10-CM

## 2014-08-04 DIAGNOSIS — E039 Hypothyroidism, unspecified: Secondary | ICD-10-CM

## 2014-08-04 LAB — CBC WITH DIFFERENTIAL/PLATELET
BASO%: 1.1 % (ref 0.0–2.0)
BASOS ABS: 0 10*3/uL (ref 0.0–0.1)
EOS%: 2.5 % (ref 0.0–7.0)
Eosinophils Absolute: 0.1 10*3/uL (ref 0.0–0.5)
HCT: 33.1 % — ABNORMAL LOW (ref 34.8–46.6)
HEMOGLOBIN: 10.6 g/dL — AB (ref 11.6–15.9)
LYMPH%: 26.2 % (ref 14.0–49.7)
MCH: 29.2 pg (ref 25.1–34.0)
MCHC: 31.9 g/dL (ref 31.5–36.0)
MCV: 91.5 fL (ref 79.5–101.0)
MONO#: 0.3 10*3/uL (ref 0.1–0.9)
MONO%: 5.9 % (ref 0.0–14.0)
NEUT#: 2.9 10*3/uL (ref 1.5–6.5)
NEUT%: 64.3 % (ref 38.4–76.8)
Platelets: 356 10*3/uL (ref 145–400)
RBC: 3.62 10*6/uL — ABNORMAL LOW (ref 3.70–5.45)
RDW: 14.8 % — ABNORMAL HIGH (ref 11.2–14.5)
WBC: 4.5 10*3/uL (ref 3.9–10.3)
lymph#: 1.2 10*3/uL (ref 0.9–3.3)

## 2014-08-04 LAB — COMPREHENSIVE METABOLIC PANEL (CC13)
ALBUMIN: 3.9 g/dL (ref 3.5–5.0)
ALT: 9 U/L (ref 0–55)
AST: 15 U/L (ref 5–34)
Alkaline Phosphatase: 68 U/L (ref 40–150)
Anion Gap: 10 mEq/L (ref 3–11)
BUN: 11.4 mg/dL (ref 7.0–26.0)
CALCIUM: 8.4 mg/dL (ref 8.4–10.4)
CO2: 26 mEq/L (ref 22–29)
Chloride: 107 mEq/L (ref 98–109)
Creatinine: 1.1 mg/dL (ref 0.6–1.1)
EGFR: 63 mL/min/{1.73_m2} — AB (ref >90)
Glucose: 88 mg/dl (ref 70–140)
POTASSIUM: 3.5 meq/L (ref 3.5–5.1)
SODIUM: 143 meq/L (ref 136–145)
TOTAL PROTEIN: 7.9 g/dL (ref 6.4–8.3)
Total Bilirubin: 0.25 mg/dL (ref 0.20–1.20)

## 2014-08-04 LAB — TSH CHCC: TSH: 48.047 m(IU)/L — ABNORMAL HIGH (ref 0.308–3.960)

## 2014-08-04 MED ORDER — LEVOTHYROXINE SODIUM 150 MCG PO TABS: 150.0000 ug | ORAL_TABLET | Freq: Every day | ORAL | Status: DC

## 2014-08-04 NOTE — Telephone Encounter (Signed)
Per Dr. Alvy Bimler, I e-scribed a prescription for Levothyroxine 150 mcg per day. This is a dose increase. I tried calling patient's home but answering machine didn't let me leave a message. Patient's cell phone voicemail was full. Patient doesn't know to increase her dose of Levothyroxine. Also, patient needs to recheck thyroid level in 3 months. She can recheck it with PCP or here at [facility]. If she gets it rechecked at [facility], patient will need a lab appointment made.

## 2014-08-05 DIAGNOSIS — R635 Abnormal weight gain: Secondary | ICD-10-CM | POA: Insufficient documentation

## 2014-08-05 NOTE — Assessment & Plan Note (Signed)
She has excessive weight gain. This is likely due to her hypothyroidism, of which her treatment will be adjusted

## 2014-08-05 NOTE — Assessment & Plan Note (Signed)
This is likely anemia of chronic disease. The patient denies recent history of bleeding such as epistaxis, hematuria or hematochezia. She is asymptomatic from the anemia. We will observe for now.  She does not require transfusion now.   

## 2014-08-05 NOTE — Assessment & Plan Note (Signed)
Repeat TSH is high. I will increase her prescription and recommend recheck TSH in 3 months either here or PCP

## 2014-08-05 NOTE — Progress Notes (Signed)
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 She subsequently underwent total laryngectomy and bilateral neck dissection on 07/31/2010. The patient have high risk features including extracapsular extension, perineural invasion and lymphovascular invasion. She received adjuvant chemotherapy with radiation therapy between 09/03/2010 to 10/01/2010. The patient could not tolerate chemotherapy well and it ended prematurely. She was receiving weekly cisplatin. Radiation therapy was completed by 10/16/2010 and she was placed on observation.  INTERVAL HISTORY: Deborah [name] 60 y.o. female returns for further followup. She does not use the electronic device that was prescribed for her. She self manage her tracheostomy care. She was seen at [facility] recently. She has significant stiff neck and pain in her neck. She has severe neck fibrosis and have difficulties moving her neck. Previously she was seen by physical therapist but because of insurance issue she was not able to continue outpatient rehabilitation. She has recent weight gain. Denies dysphagia.  REVIEW OF SYSTEMS:   Constitutional: Denies fevers, chills or abnormal weight loss Eyes: Denies blurriness of vision Ears, nose, mouth, throat, and face: Denies mucositis or sore throat Respiratory: Denies cough, dyspnea or wheezes Cardiovascular: Denies palpitation, chest discomfort or lower extremity swelling Gastrointestinal:  Denies nausea, heartburn or change in bowel habits Skin: Denies abnormal skin  rashes Lymphatics: Denies new lymphadenopathy or easy bruising Neurological:Denies numbness, tingling or new weaknesses Behavioral/Psych: Mood is stable, no new changes  All other systems were reviewed with the patient and are negative.  I have reviewed the past medical history, past surgical history, social history and family history with the patient and they are unchanged from previous note.  ALLERGIES:  has No Known Allergies.  MEDICATIONS:  Current Outpatient Prescriptions  Medication Sig Dispense Refill  . albuterol (PROVENTIL HFA;VENTOLIN HFA) 108 (90 BASE) MCG/ACT inhaler Inhale 2 puffs into the lungs every 4 (four) hours as needed for wheezing or shortness of breath. 1 Inhaler 1  . benzonatate (TESSALON) 100 MG capsule TAKE ONE CAPSULE BY MOUTH TWICE DAILY AS NEEDED FOR COUGH 30 capsule 1  . buPROPion (WELLBUTRIN XL) 150 MG 24 hr tablet Take 2 tablets (300 mg total) by mouth daily. 60 tablet 4  . cyclobenzaprine (FLEXERIL) 10 MG tablet TAKE ONE TABLET BY MOUTH TWICE DAILY AS NEEDED FOR MUSCLE SPASM 30 tablet 2  . EQ LORATADINE 10 MG tablet TAKE ONE TABLET BY MOUTH ONCE DAILY 30 tablet 0  . ibuprofen (ADVIL,MOTRIN) 800 MG tablet TAKE ONE TABLET BY MOUTH EVERY 8 HOURS AS NEEDED FOR MODERATE PAIN. 60 tablet 0  . lisinopril (PRINIVIL,ZESTRIL) 20 MG tablet TAKE ONE TABLET BY MOUTH ONCE DAILY 30 tablet 4  . loratadine (CLARITIN) 10 MG tablet Take 10 mg by mouth daily.    [name] MELATONIN PO Take 1 tablet by mouth at bedtime as needed (sleep).    . metoprolol tartrate (LOPRESSOR) 25 MG tablet TAKE ONE TABLET BY MOUTH TWICE DAILY 60 tablet 0  . mometasone-formoterol (DULERA) 200-5 MCG/ACT AERO Inhale 2 puffs into the lungs 2 (two) times daily. 1 Inhaler 2  . omeprazole (PRILOSEC) 40 MG capsule TAKE ONE CAPSULE BY MOUTH IN THE  MORNING. TAKE  AT LEAST  FOUR  HOURS  AFTER  TAKING  LEVOTHYROXINE. 30 capsule 0  . ondansetron (ZOFRAN-ODT) 4 MG disintegrating tablet Take 4 mg by mouth every 8 (eight)  hours as needed for nausea.    [name] levothyroxine (SYNTHROID) 150 MCG tablet Take 1 tablet (150 mcg total) by mouth daily before breakfast. 30 tablet 2   No current facility-administered medications for this visit.    PHYSICAL EXAMINATION: ECOG PERFORMANCE STATUS: 0 - Asymptomatic  Filed Vitals:   08/04/14 1150  BP: 148/81  Pulse: 104  Temp: 98.4 F (36.9 C)  Resp: 20   Filed Weights   08/04/14 1150  Weight: 221 lb 12.8 oz (100.608 kg)    GENERAL:alert, no distress and comfortable. She is morbidly obese SKIN: skin color, texture, turgor are normal, no rashes or significant lesions EYES: normal, Conjunctiva are pink and non-injected, sclera clear OROPHARYNX:no exudate, no erythema and lips, buccal mucosa, and tongue normal  NECK: noted trach with stiff neck. LYMPH:  no palpable lymphadenopathy in the cervical, axillary or inguinal LUNGS: clear to auscultation and percussion with normal breathing effort HEART: regular rate & rhythm and no murmurs and no lower extremity edema ABDOMEN:abdomen soft, non-tender and normal bowel sounds Musculoskeletal:no cyanosis of digits and no clubbing  NEURO: alert & oriented x 3 with fluent speech, no focal motor/sensory deficits  LABORATORY DATA:  I have reviewed the data as listed    Component Value Date/Time   NA 143 08/04/2014 1136   NA 144 04/28/2014 0658   NA 143 01/20/2012 0939   K 3.5 08/04/2014 1136   K 3.8 04/28/2014 0658   K 4.3 01/20/2012 0939   CL 105 04/28/2014 0658   CL 105 11/23/2012 0807   CL 100 01/20/2012 0939   CO2 26 08/04/2014 1136   CO2 24 04/28/2014 0658   CO2 27 01/20/2012 0939   GLUCOSE 88 08/04/2014 1136   GLUCOSE 81 04/28/2014 0658   GLUCOSE 83 11/23/2012 0807   GLUCOSE 97 01/20/2012 0939   BUN 11.4 08/04/2014 1136   BUN 13 04/28/2014 0658   BUN 18 01/20/2012 0939   CREATININE 1.1 08/04/2014 1136   CREATININE 0.98 04/28/2014 0658   CREATININE 0.97 10/13/2013 0906   CALCIUM 8.4 08/04/2014 1136    CALCIUM 9.1 04/28/2014 0658   CALCIUM 9.6 01/20/2012 0939   PROT 7.9 08/04/2014 1136   PROT 8.2 10/13/2013 0906   PROT 8.7* 01/20/2012 0939   ALBUMIN 3.9 08/04/2014 1136   ALBUMIN 4.3 10/13/2013 0906   AST 15 08/04/2014 1136   AST 18 10/13/2013 0906   AST 20 01/20/2012 0939   ALT 9 08/04/2014 1136   ALT 14 10/13/2013 0906   ALT 16 01/20/2012 0939   ALKPHOS 68 08/04/2014 1136   ALKPHOS 91 10/13/2013 0906   ALKPHOS 84 01/20/2012 0939   BILITOT 0.25 08/04/2014 1136   BILITOT 0.2 10/13/2013 0906   BILITOT 0.70 01/20/2012 0939   GFRNONAA 63* 04/28/2014 0658   GFRAA 73* 04/28/2014 0658    No results found for: SPEP, UPEP  Lab Results  Component Value Date   WBC 4.5 08/04/2014   NEUTROABS 2.9 08/04/2014   HGB 10.6* 08/04/2014   HCT 33.1* 08/04/2014   MCV 91.5 08/04/2014   PLT 356 08/04/2014      Chemistry      Component Value Date/Time   NA 143 08/04/2014 1136   NA 144 04/28/2014 0658   NA 143 01/20/2012 0939   K 3.5 08/04/2014 1136  K 3.8 04/28/2014 0658   K 4.3 01/20/2012 0939   CL 105 04/28/2014 0658   CL 105 11/23/2012 0807   CL 100 01/20/2012 0939   CO2 26 08/04/2014 1136   CO2 24 04/28/2014 0658   CO2 27 01/20/2012 0939   BUN 11.4 08/04/2014 1136   BUN 13 04/28/2014 0658   BUN 18 01/20/2012 0939   CREATININE 1.1 08/04/2014 1136   CREATININE 0.98 04/28/2014 0658   CREATININE 0.97 10/13/2013 0906      Component Value Date/Time   CALCIUM 8.4 08/04/2014 1136   CALCIUM 9.1 04/28/2014 0658   CALCIUM 9.6 01/20/2012 0939   ALKPHOS 68 08/04/2014 1136   ALKPHOS 91 10/13/2013 0906   ALKPHOS 84 01/20/2012 0939   AST 15 08/04/2014 1136   AST 18 10/13/2013 0906   AST 20 01/20/2012 0939   ALT 9 08/04/2014 1136   ALT 14 10/13/2013 0906   ALT 16 01/20/2012 0939   BILITOT 0.25 08/04/2014 1136   BILITOT 0.2 10/13/2013 0906   BILITOT 0.70 01/20/2012 0939       ASSESSMENT & PLAN:  Malignant neoplasm of supraglottis Clinically she has no recurrence of  disease. She will continue close ENT follow-up with trach care. i will see her in 1 year with history, physical exam and blood work   Anemia in chronic illness This is likely anemia of chronic disease. The patient denies recent history of bleeding such as epistaxis, hematuria or hematochezia. She is asymptomatic from the anemia. We will observe for now.  She does not require transfusion now.     Hypothyroidism Repeat TSH is high. I will increase her prescription and recommend recheck TSH in 3 months either here or PCP   Weight gain She has excessive weight gain. This is likely due to her hypothyroidism, of which her treatment will be adjusted    Orders Placed This Encounter  Procedures  . CBC with Differential/Platelet    Standing Status: Future     Number of Occurrences:      Standing Expiration Date: 09/08/2015  . Comprehensive metabolic panel    Standing Status: Future     Number of Occurrences:      Standing Expiration Date: 09/08/2015  . TSH    Standing Status: Future     Number of Occurrences:      Standing Expiration Date: 09/08/2015   All questions were answered. The patient knows to call the clinic with any problems, questions or concerns. No barriers to learning was detected. I spent 25 minutes counseling the patient face to face. The total time spent in the appointment was 30 minutes and more than 50% was on counseling and review of test results     [facility], [place], MD 08/05/2014 7:35 PM

## 2014-08-05 NOTE — Assessment & Plan Note (Signed)
Clinically she has no recurrence of disease. She will continue close ENT follow-up with trach care. i will see her in 1 year with history, physical exam and blood work

## 2014-08-07 ENCOUNTER — Telehealth: Payer: Self-pay | Admitting: Hematology and Oncology

## 2014-08-07 NOTE — Telephone Encounter (Signed)
Lft msg for pt confirming labs/ov per 02/19 POF, mailed schedule to pt... KJ

## 2014-08-14 ENCOUNTER — Other Ambulatory Visit: Payer: Self-pay | Admitting: Hematology and Oncology

## 2014-08-14 ENCOUNTER — Other Ambulatory Visit: Payer: Self-pay | Admitting: Family Medicine

## 2014-08-14 NOTE — Telephone Encounter (Signed)
Please have patient schedule an appointment. I am concerned about how much albuterol she has been using and want to make sure it is well controlled. She may need additional medication

## 2014-08-14 NOTE — Telephone Encounter (Addendum)
Left voice message for pt to call and schedule an appt to discuss albuterol inhaler and asthma.  Derl Barrow, RN

## 2014-08-16 ENCOUNTER — Encounter: Payer: Self-pay | Admitting: Obstetrics and Gynecology

## 2014-08-16 ENCOUNTER — Ambulatory Visit (INDEPENDENT_AMBULATORY_CARE_PROVIDER_SITE_OTHER): Payer: Medicaid Other | Admitting: Obstetrics and Gynecology

## 2014-08-16 VITALS — BP 140/88 | HR 100 | Temp 98.6°F | Ht 67.0 in | Wt 220.0 lb

## 2014-08-16 DIAGNOSIS — J441 Chronic obstructive pulmonary disease with (acute) exacerbation: Secondary | ICD-10-CM

## 2014-08-16 DIAGNOSIS — R059 Cough, unspecified: Secondary | ICD-10-CM

## 2014-08-16 DIAGNOSIS — R0600 Dyspnea, unspecified: Secondary | ICD-10-CM

## 2014-08-16 DIAGNOSIS — I10 Essential (primary) hypertension: Secondary | ICD-10-CM

## 2014-08-16 DIAGNOSIS — R05 Cough: Secondary | ICD-10-CM

## 2014-08-16 DIAGNOSIS — R1310 Dysphagia, unspecified: Secondary | ICD-10-CM

## 2014-08-16 MED ORDER — BENZONATATE 100 MG PO CAPS: ORAL_CAPSULE | ORAL | Status: DC

## 2014-08-16 MED ORDER — PREDNISONE 20 MG PO TABS: 40.0000 mg | ORAL_TABLET | Freq: Every day | ORAL | Status: DC

## 2014-08-16 MED ORDER — LOSARTAN POTASSIUM 50 MG PO TABS: 50.0000 mg | ORAL_TABLET | Freq: Every day | ORAL | Status: DC

## 2014-08-16 MED ORDER — ALBUTEROL SULFATE HFA 108 (90 BASE) MCG/ACT IN AERS: INHALATION_SPRAY | RESPIRATORY_TRACT | Status: DC

## 2014-08-16 MED ORDER — ONDANSETRON 4 MG PO TBDP: ORAL_TABLET | ORAL | Status: DC

## 2014-08-16 MED ORDER — MOMETASONE FURO-FORMOTEROL FUM 200-5 MCG/ACT IN AERO: 2.0000 | INHALATION_SPRAY | Freq: Two times a day (BID) | RESPIRATORY_TRACT | Status: DC

## 2014-08-16 MED ORDER — DOXYCYCLINE HYCLATE 100 MG PO TABS: 100.0000 mg | ORAL_TABLET | Freq: Two times a day (BID) | ORAL | Status: DC

## 2014-08-16 NOTE — Assessment & Plan Note (Signed)
A: Worsening dyspnea, wheezing, and cough. P: Will treat as COPD exacerbation. Patient given refills of inhalers. Also given short burst of steroids (5 days) and antibiotics for 7 days. She will follow-up in clinic in 2 weeks.

## 2014-08-16 NOTE — Patient Instructions (Signed)
Deborah [name] it was great to see you today!  I am sorry to hear that you have not been feeling well.  Here are some of the things we discussed today: -For cough and shortness of breath. I refilled inhalers. I am starting you on antibiotics and steriods.  - I put in a GI referral for your trouble swallowing -I changed your BP medication around due to this chronic cough  New medications: Steroids once daily for 5 days Antibiotic take twice daily for 7 days  Please schedule a follow-up appointment for 2 weeks  Thanks for allowing me to be a part of your care! Dr. Gerarda Fraction  Chronic Obstructive Pulmonary Disease Chronic obstructive pulmonary disease (COPD) is a common lung problem. In COPD, the flow of air from the lungs is limited. The way your lungs work will probably never return to normal, but there are things you can do to improve your lungs and make yourself feel better. HOME CARE  Take all medicines as told by your doctor.  Avoid medicines or cough syrups that dry up your airway (such as antihistamines) and do not allow you to get rid of thick spit. You do not need to avoid them if told differently by your doctor.  If you smoke, stop. Smoking makes the problem worse.  Avoid being around things that make your breathing worse (like smoke, chemicals, and fumes).  Use oxygen therapy and therapy to help improve your lungs (pulmonary rehabilitation) if told by your doctor. If you need home oxygen therapy, ask your doctor if you should buy a tool to measure your oxygen level (oximeter).  Avoid people who have a sickness you can catch (contagious).  Avoid going outside when it is very hot, cold, or humid.  Eat healthy foods. Eat smaller meals more often. Rest before meals.  Stay active, but remember to also rest.  Make sure to get all the shots (vaccines) your doctor recommends. Ask your doctor if you need a pneumonia shot.  Learn and use tips on how to relax.  Learn and use  tips on how to control your breathing as told by your doctor. Try:  Breathing in (inhaling) through your nose for 1 second. Then, pucker your lips and breath out (exhale) through your lips for 2 seconds.  Putting one hand on your belly (abdomen). Breathe in slowly through your nose for 1 second. Your hand on your belly should move out. Pucker your lips and breathe out slowly through your lips. Your hand on your belly should move in as you breathe out.  Learn and use controlled coughing to clear thick spit from your lungs. The steps are: 1. Lean your head a little forward. 2. Breathe in deeply. 3. Try to hold your breath for 3 seconds. 4. Keep your mouth slightly open while coughing 2 times. 5. Spit any thick spit out into a tissue. 6. Rest and do the steps again 1 or 2 times as needed. GET HELP IF:  You cough up more thick spit than usual.  There is a change in the color or thickness of the spit.  It is harder to breathe than usual.  Your breathing is faster than usual. GET HELP RIGHT AWAY IF:   You have shortness of breath while resting.  You have shortness of breath that stops you from:  Being able to talk.  Doing normal activities.  You chest hurts for longer than 5 minutes.  Your skin color is more blue than usual.  Your  pulse oximeter shows that you have low oxygen for longer than 5 minutes. MAKE SURE YOU:   Understand these instructions.  Will watch your condition.  Will get help right away if you are not doing well or get worse. Document Released: 11/19/2007 Document Revised: 10/17/2013 Document Reviewed: 01/27/2013 [facility] Patient Information 2015 [place], [place]. This information is not intended to replace advice given to you by your health care provider. Make sure you discuss any questions you have with your health care provider.

## 2014-08-16 NOTE — Progress Notes (Signed)
I was preceptor the day of this visit.   

## 2014-08-16 NOTE — Assessment & Plan Note (Signed)
A: Recurrent issue. Now with constant cough for about 2 months. Has had this issue previously. No concern for infection at this time as VSS. Does have hx of COPD, former smoker, denies GERD. P: Will discontinue Losartan. Treating as COPD exacerbation as well.

## 2014-08-16 NOTE — Assessment & Plan Note (Signed)
>>  ASSESSMENT AND PLAN FOR DYSPHAGIA WRITTEN ON 08/16/2014  5:20 PM BY Ashanty Coltrane Y, DO  New symptoms of trouble swallowing solids and liquids with associated emesis. Patient with history of neck irradiation due to malignancy. Concern for motility disorder vs achalasia vs stricture vs malignancy. Referral placed for urgent GI consult to further evaluate.

## 2014-08-16 NOTE — Assessment & Plan Note (Signed)
New symptoms of trouble swallowing solids and liquids with associated emesis. Patient with history of neck irradiation due to malignancy. Concern for motility disorder vs achalasia vs stricture vs malignancy. Referral placed for urgent GI consult to further evaluate.

## 2014-08-16 NOTE — Assessment & Plan Note (Signed)
A: Slightly elevated today. Patient usually well controlled. P: Lisinopril switched to Losartan. Will re-evaluate pressure at next clinic visit in two weeks. Will adjust medications as indicated at that time.

## 2014-08-16 NOTE — Progress Notes (Signed)
Subjective: Chief Complaint  Patient presents with  . Hypertension    HPI: Deborah [name] is a 59 y.o. presenting to clinic today to discuss the following:  Hypertension: Blood pressure at home: Not recording Blood pressure today: originally 166/98 but recheck 140/88 Taking Meds: yes, lisinopril and lopressor Side effects: Possible cough ROS: Denies headache, dizziness, visual changes, nausea, vomiting, chest pain, abdominal pain or shortness of breath.  Dyspnea with cough: -has had SOB for >3 months -not better with inhaler medications using Dulera and albuterol daily -productive chronic cough -endorses wheezing -states that she is coughing so much her neck hurts -was previously sick with URI about a month ago -hx of COPD -cough medicine not working -patient states she feels like she cannot catch breath  Dysphagia: -endorses difficulty with swallowing liquids and solids -states it feels like liquid just sits in throat -has had increased vomiting -difficulty with swallowing ins intermittent  All systems were reviewed and were negative unless otherwise noted in the HPI Past Medical, Surgical, Social, and Family History Reviewed & Updated per EMR.  Objective: BP 140/88 mmHg  Pulse 100  Temp(Src) 98.6 F (37 C) (Oral)  Ht 5\' 7"  (1.702 m)  Wt 220 lb (99.791 kg)  BMI 34.45 kg/m2  SpO2 95%  Physical Exam Gen: NAD, alert, cooperative with exam,  HEENT: NCAT, trach'd chronically with stiff neck Neck: FROM, supple CV: RRR, good S1/S2, no murmur, cap refill <3 Resp: CTAB, decreased breath sounds, no wheezing or rales appreciated, increased work of breathing, intermittent cough Neuro: Alert and oriented, No gross deficits Skin: petechial rash noted on chest wall with associated color changes  Assessment/Plan: Please see problem based Assessment and Plan  Health Maintainance: Not discussed  Orders Placed This Encounter  Procedures  . Ambulatory referral to  Gastroenterology    Referral Priority:  Urgent    Referral Type:  Consultation    Referral Reason:  Specialty Services Required    Requested Specialty:  Gastroenterology    Number of Visits Requested:  1    Meds ordered this encounter  Medications  . losartan (COZAAR) 50 MG tablet    Sig: Take 1 tablet (50 mg total) by mouth daily.    Dispense:  90 tablet    Refill:  3  . predniSONE (DELTASONE) 20 MG tablet    Sig: Take 2 tablets (40 mg total) by mouth daily with breakfast.    Dispense:  5 tablet    Refill:  0  . doxycycline (VIBRA-TABS) 100 MG tablet    Sig: Take 1 tablet (100 mg total) by mouth 2 (two) times daily.    Dispense:  14 tablet    Refill:  0  . albuterol (PROVENTIL HFA) 108 (90 BASE) MCG/ACT inhaler    Sig: INHALE 2 PUFFS INTO THE LUNGS EVERY 4 HOURS AS NEEDED FOR WHEEZING OR SHORTNESS OF BREATH.    Dispense:  2 each    Refill:  0  . mometasone-formoterol (DULERA) 200-5 MCG/ACT AERO    Sig: Inhale 2 puffs into the lungs 2 (two) times daily.    Dispense:  1 Inhaler    Refill:  2    Order Specific Question:  Lot Number?    Answer:  [id number]    Order Specific Question:  Expiration Date?    Answer:  10/12/2014    Order Specific Question:  NDC    Answer:  [id number] [[id number]]    Order Specific Question:  Quantity    Answer:  1  . ondansetron (ZOFRAN-ODT) 4 MG disintegrating tablet    Sig: DISSOLVE ONE TABLET IN MOUTH EVERY 8 HOURS AS NEEDED FOR NAUSEA    Dispense:  60 tablet    Refill:  0  . benzonatate (TESSALON) 100 MG capsule    Sig: TAKE ONE CAPSULE BY MOUTH TWICE DAILY AS NEEDED FOR COUGH    Dispense:  30 capsule    Refill:  [place], DO 08/16/2014, 5:13 PM PGY-1, [place]

## 2014-08-29 ENCOUNTER — Ambulatory Visit: Payer: Self-pay | Admitting: Obstetrics and Gynecology

## 2014-09-12 ENCOUNTER — Other Ambulatory Visit: Payer: Self-pay | Admitting: Obstetrics and Gynecology

## 2014-09-12 ENCOUNTER — Ambulatory Visit (INDEPENDENT_AMBULATORY_CARE_PROVIDER_SITE_OTHER): Payer: Medicaid Other | Admitting: Internal Medicine

## 2014-09-12 ENCOUNTER — Encounter: Payer: Self-pay | Admitting: Internal Medicine

## 2014-09-12 VITALS — BP 138/82 | HR 96 | Ht 67.0 in | Wt 220.4 lb

## 2014-09-12 DIAGNOSIS — R1310 Dysphagia, unspecified: Secondary | ICD-10-CM

## 2014-09-12 DIAGNOSIS — Z9889 Other specified postprocedural states: Secondary | ICD-10-CM | POA: Diagnosis not present

## 2014-09-12 DIAGNOSIS — J398 Other specified diseases of upper respiratory tract: Secondary | ICD-10-CM | POA: Diagnosis not present

## 2014-09-12 DIAGNOSIS — Z9002 Acquired absence of larynx: Secondary | ICD-10-CM

## 2014-09-12 NOTE — Progress Notes (Signed)
Deborah [name] Sep 22, 1955 [id number]  Note: This dictation was prepared with Dragon digital system. Any transcriptional errors that result from this procedure are unintentional. Pt referred By Dr Gerarda Fraction at [facility]  History of Present Illness: This is a 59 year old female with history of squamous cell carcinoma of the supraglottis. Who underwent laryngectomy and bilateral neck dissection in February 2012 by Dr Arville Care.. She received adjuvant chemotherapy and radiation between March and April 2012. She has a tracheostomy. She has developed stiff neck due to fibrosis. She has been having dysphagia to liquids as well as to solids resulting in expectoration of the food.. Patient is currently free of any tumor as per Dr Alvy Bimler. Last time she saw [name] was in July 2014. She underwent tracheal dilation in January 2013.She has not lost any weight although at times she is unable to swallow even liquids. The symptoms have been going on for past 4 weeks. Last barium esophagram was in January 2014 and showed a barium tablet  Retained in the upper esophagus. I have seen patient in the past for screening colonoscopy which was completed in June 2014 and showed mild diverticulosis. She denies any change in bowel habits bleeding or constipation    Past Medical History  Diagnosis Date  . Hypertension   . Tracheostomy dependent   . History of laryngectomy   . Bronchitis   . 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  [name] Heart murmur     asymptomatic   . Leukocytopenia   . Asthma   . Sinusitis, chronic 07/20/2011  . Normal MRI 07/14/11    negative for mestasis   . Hx of radiation therapy 09/03/10 to 10/16/2010    supraglottic larynx  . 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 Neck pain 01/21/2012  . GERD (gastroesophageal reflux disease)   . Sepsis 08/04/12  . Seizures     07/24/11 off Effexor w/o seizure  . Anemia   . Sinusitis, chronic 07/20/2011    Bilateral maxillary, identified on MRI of head 07/14/11.    . Diverticulosis 11/15/2012     noted on screening colonoscopy   . Internal hemorrhoid 11/15/2012     small, noted on screening colonoscopy   . Nausea alone 07/28/2013    Past Surgical History  Procedure Laterality Date  . Portacath placement  09/17/10    Tip in cavoatrial junction  . Laryngectomy    . Tracheal dilitation  07/16/2011    Procedure: TRACHEAL DILITATION;  Surgeon: Beckie Salts, MD;  Location: [place];  Service: ENT;  Laterality: N/A;  dilation of tracheal stoma and replacement of stoma tube  . Foreign body removal bronchial  10/02/2011    Procedure: REMOVAL FOREIGN BODY BRONCHIAL;  Surgeon: Ruby Cola, MD;  Location: [place];  Service: ENT;  Laterality: N/A;  . Esophagoscopy  06/21/2012    Procedure: ESOPHAGOSCOPY;  Surgeon: [name], MD;  Location: [place];  Service: ENT;  Laterality: N/A;  . Tubal ligation  1982  . Colonoscopy N/A 11/15/2012    Procedure: COLONOSCOPY;  Surgeon: Lafayette Dragon, MD;  Location: [facility];  Service: Endoscopy;  Laterality: N/A;  . Breast surgery      Allergies  Allergen Reactions  . Lisinopril Cough    Family history and social history have been reviewed.  Review of Systems: Dysphagia to liquids, solids and pills, negative for shortness of breath nausea or vomiting  The remainder of the  10 point ROS is negative except as outlined in the H&P  Physical Exam: General Appearance Well developed, in no distress, tracheostomy in place, however start tracheostomy when she speaks Eyes  Non icteric  HEENT  Non traumatic, normocephalic  Mouth No lesion, tongue papillated, no cheilosis Neck Supple without adenopathy, thyroid not enlarged, no carotid bruits, no JVD Lungs Clear to auscultation bilaterally COR Normal S1, normal S2, regular rhythm, no murmur, quiet precordium Abdomen obese, soft. Nontender with normoactive bowel sounds Rectal not done Extremities  No  pedal edema Skin No lesions Neurological Alert and oriented x 3 Psychological Normal mood and affect  Assessment and Plan:   59 year old African-American female 4 years post  radical neck dissection and laryngectomy for supraglottic squamous cell carcinoma. She is tumor free at present time. She has a tracheal prosthesis in place. She had a tracheal stricture. I have talked to Dr. Janeice Robinson office and she made  an appointment for her for April 4 at 3:40 PM. She needs a referral from Dr. Gerarda Fraction., Family practice since she is Medicaid. We will proceed with barium esophagram to localize the stricture to see whether it is ccessible to endoscopic dilation or whether she needs to be dilated again  by [name]  CC Dr Arville Care, Dr Nelva Nay 09/12/2014

## 2014-09-12 NOTE — Patient Instructions (Addendum)
You have been scheduled for a Barium Esophogram at [facility] (1st floor of the hospital) on Tuesday 09/19/14 at 11:30 am. Please arrive 15 minutes prior to your appointment for registration. Make certain not to have anything to eat or drink 3 hours prior to your test. If you need to reschedule for any reason, please contact radiology at [phone] to do so. __________________________________________________________________ A barium swallow is an examination that concentrates on views of the esophagus. This tends to be a double contrast exam (barium and two liquids which, when combined, create a gas to distend the wall of the oesophagus) or single contrast (non-ionic iodine based). The study is usually tailored to your symptoms so a good history is essential. Attention is paid during the study to the form, structure and configuration of the esophagus, looking for functional disorders (such as aspiration, dysphagia, achalasia, motility and reflux) EXAMINATION You may be asked to change into a gown, depending on the type of swallow being performed. A radiologist and radiographer will perform the procedure. The radiologist will advise you of the type of contrast selected for your procedure and direct you during the exam. You will be asked to stand, sit or lie in several different positions and to hold a small amount of fluid in your mouth before being asked to swallow while the imaging is performed .In some instances you may be asked to swallow barium coated marshmallows to assess the motility of a solid food bolus. The exam can be recorded as a digital or video fluoroscopy procedure. POST PROCEDURE It will take 1-2 days for the barium to pass through your system. To facilitate this, it is important, unless otherwise directed, to increase your fluids for the next 24-48hrs and to resume your normal diet.  This test typically takes about 30 minutes to  perform. __________________________________________________________________________________  CC:Dr Constance Holster appointm April 4, at 3.40 pm, neede referral from Dr Gerarda Fraction to Dr Constance Holster

## 2014-09-13 ENCOUNTER — Encounter: Payer: Self-pay | Admitting: Obstetrics and Gynecology

## 2014-09-13 ENCOUNTER — Ambulatory Visit (HOSPITAL_COMMUNITY)
Admission: RE | Admit: 2014-09-13 | Discharge: 2014-09-13 | Disposition: A | Discharge status: Home / Self Care | Payer: Medicaid Other | Source: Ambulatory Visit | Attending: Internal Medicine | Admitting: Internal Medicine

## 2014-09-13 ENCOUNTER — Other Ambulatory Visit: Payer: Self-pay | Admitting: Obstetrics and Gynecology

## 2014-09-13 DIAGNOSIS — K222 Esophageal obstruction: Secondary | ICD-10-CM | POA: Insufficient documentation

## 2014-09-13 DIAGNOSIS — R1310 Dysphagia, unspecified: Secondary | ICD-10-CM | POA: Diagnosis present

## 2014-09-13 DIAGNOSIS — Z8589 Personal history of malignant neoplasm of other organs and systems: Secondary | ICD-10-CM | POA: Diagnosis not present

## 2014-09-13 NOTE — Progress Notes (Signed)
erro  neous encounter

## 2014-09-14 ENCOUNTER — Other Ambulatory Visit: Payer: Self-pay | Admitting: Obstetrics and Gynecology

## 2014-09-19 ENCOUNTER — Ambulatory Visit (HOSPITAL_COMMUNITY): Payer: Medicaid Other

## 2014-09-20 ENCOUNTER — Encounter (HOSPITAL_COMMUNITY): Payer: Self-pay | Admitting: *Deleted

## 2014-09-20 ENCOUNTER — Ambulatory Visit: Payer: Self-pay | Admitting: Otolaryngology

## 2014-09-20 NOTE — Anesthesia Preprocedure Evaluation (Addendum)
Anesthesia Evaluation  Patient identified by MRN, date of birth, ID band Patient awake    Reviewed: Allergy & Precautions, NPO status , Patient's Chart, lab work & pertinent test results, reviewed documented beta blocker date and time   Airway Mallampati: Trach  TM Distance: >3 FB Neck ROM: Full    Dental  (+) Edentulous Upper, Edentulous Lower   Pulmonary shortness of breath, COPDformer smoker (40 pack year),  Tracheostomy SP laryngectomy breath sounds clear to auscultation        Cardiovascular hypertension, Pt. on medications Rhythm:Regular  ECHO 04/2014 EF 60%   Neuro/Psych Seizures -,  Anxiety    GI/Hepatic Neg liver ROS, GERD-  Medicated,  Endo/Other  negative endocrine ROS  Renal/GU negative Renal ROS     Musculoskeletal   Abdominal (+) + obese,   Peds  Hematology 11/33   Anesthesia Other Findings SP laryngectomy 2012  Reproductive/Obstetrics                          Anesthesia Physical Anesthesia Plan  ASA: III  Anesthesia Plan: General   Post-op Pain Management:    Induction: Intravenous  Airway Management Planned:   Additional Equipment:   Intra-op Plan:   Post-operative Plan:   Informed Consent: I have reviewed the patients History and Physical, chart, labs and discussed the procedure including the risks, benefits and alternatives for the proposed anesthesia with the patient or authorized representative who has indicated his/her understanding and acceptance.     Plan Discussed with:   Anesthesia Plan Comments:         Anesthesia Quick Evaluation

## 2014-09-20 NOTE — H&P (Signed)
  Assessment  Difficulty swallowing solids (787.20) (R13.10). History of laryngectomy (V45.89) (Z90.02). Discussed  Having progressively worsening swallowing especially with solids. 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 She also happened to lose her voice prosthesis over the weekend. She has an appointment at [facility]. She is able to phonate very nicely with finger occlusion still. On exam, the stoma looks healthy and clear without a prosthesis. No other abnormalities. Recommend esophageal dilatation under anesthesia as soon as possible. Reason For Visit  Blockage in throat. Allergies  Lactose. Current Meds  Proventil AERS (Albuterol);; RPT Benzonatate CAPS;; RPT Dulera AERO;; RPT. 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 History of THROAT PAIN_ (784.1); Resolved: 04Apr2016. History of Cellulitis of larynx (478.71) (J38.7); Resolved: 04Apr2016. History of Absence of voice (784.41) (R49.1); Resolved: 04Apr2016. PSH  Total Laryngectomy 31 Jul 2010; modified neck dissection Tracheostomy Construct TE Fistula, Insert Speech Prosthesis 05 Feb 2011. Family Hx  Acute Myocardial Infarction: Mother (V17.3) Family history of malignant neoplasm (V16.9) (Z80.9) Seasonal allergies (J30.2). Personal Hx  Former smoker (V15.82) [phone]) No alcohol use No caffeine use. Vital Signs   Recorded by Rogers,Lisa on 18 Sep 2014 03:55 PM BP:124/70,  Height: 5 ft 7.5 in, Weight: 220 lb , BMI: 33.9 kg/m2,  BMI Calculated: 33.95 ,  BSA Calculated: 2.12. Signature  Electronically signed by : [name]  M.D.; 09/18/2014 4:22 PM EST.

## 2014-09-21 ENCOUNTER — Encounter (HOSPITAL_COMMUNITY): Payer: Self-pay | Admitting: *Deleted

## 2014-09-21 ENCOUNTER — Ambulatory Visit (HOSPITAL_COMMUNITY)
Admission: RE | Admit: 2014-09-21 | Discharge: 2014-09-21 | Disposition: A | Discharge status: Home / Self Care | Payer: Medicaid Other | Source: Ambulatory Visit | Attending: Otolaryngology | Admitting: Otolaryngology

## 2014-09-21 ENCOUNTER — Ambulatory Visit (HOSPITAL_COMMUNITY): Payer: Medicaid Other | Admitting: Anesthesiology

## 2014-09-21 ENCOUNTER — Encounter (HOSPITAL_COMMUNITY)
Admission: RE | Disposition: A | Discharge status: Home / Self Care | Payer: Self-pay | Source: Ambulatory Visit | Attending: Otolaryngology

## 2014-09-21 DIAGNOSIS — Z8521 Personal history of malignant neoplasm of larynx: Secondary | ICD-10-CM | POA: Insufficient documentation

## 2014-09-21 DIAGNOSIS — K222 Esophageal obstruction: Secondary | ICD-10-CM | POA: Insufficient documentation

## 2014-09-21 DIAGNOSIS — F419 Anxiety disorder, unspecified: Secondary | ICD-10-CM | POA: Insufficient documentation

## 2014-09-21 DIAGNOSIS — I10 Essential (primary) hypertension: Secondary | ICD-10-CM | POA: Diagnosis not present

## 2014-09-21 DIAGNOSIS — K219 Gastro-esophageal reflux disease without esophagitis: Secondary | ICD-10-CM | POA: Insufficient documentation

## 2014-09-21 DIAGNOSIS — Z87891 Personal history of nicotine dependence: Secondary | ICD-10-CM | POA: Diagnosis not present

## 2014-09-21 DIAGNOSIS — R1310 Dysphagia, unspecified: Secondary | ICD-10-CM | POA: Diagnosis present

## 2014-09-21 HISTORY — PX: ESOPHAGOSCOPY WITH DILITATION: SHX5618

## 2014-09-21 HISTORY — DX: Chronic obstructive pulmonary disease, unspecified: J44.9

## 2014-09-21 HISTORY — DX: Esophageal obstruction: K22.2

## 2014-09-21 LAB — CBC
HCT: 33.3 % — ABNORMAL LOW (ref 36.0–46.0)
Hemoglobin: 10.6 g/dL — ABNORMAL LOW (ref 12.0–15.0)
MCH: 29.2 pg (ref 26.0–34.0)
MCHC: 31.8 g/dL (ref 30.0–36.0)
MCV: 91.7 fL (ref 78.0–100.0)
PLATELETS: 328 10*3/uL (ref 150–400)
RBC: 3.63 MIL/uL — AB (ref 3.87–5.11)
RDW: 14.5 % (ref 11.5–15.5)
WBC: 5.2 10*3/uL (ref 4.0–10.5)

## 2014-09-21 LAB — BASIC METABOLIC PANEL
ANION GAP: 5 (ref 5–15)
BUN: 14 mg/dL (ref 6–23)
CO2: 26 mmol/L (ref 19–32)
Calcium: 8.8 mg/dL (ref 8.4–10.5)
Chloride: 108 mmol/L (ref 96–112)
Creatinine, Ser: 1.2 mg/dL — ABNORMAL HIGH (ref 0.50–1.10)
GFR calc Af Amer: 57 mL/min — ABNORMAL LOW (ref >90)
GFR calc non Af Amer: 50 mL/min — ABNORMAL LOW (ref >90)
Glucose, Bld: 95 mg/dL (ref 70–99)
Potassium: 4.2 mmol/L (ref 3.5–5.1)
Sodium: 139 mmol/L (ref 135–145)

## 2014-09-21 SURGERY — ESOPHAGOSCOPY, WITH DILATION
Anesthesia: General

## 2014-09-21 MED ORDER — ROCURONIUM BROMIDE 50 MG/5ML IV SOLN
INTRAVENOUS | Status: AC
  Filled 2014-09-21: qty 1

## 2014-09-21 MED ORDER — LIDOCAINE HCL (CARDIAC) 20 MG/ML IV SOLN
INTRAVENOUS | Status: AC
  Filled 2014-09-21: qty 5

## 2014-09-21 MED ORDER — PROMETHAZINE HCL 25 MG/ML IJ SOLN: 6.2500 mg | INTRAMUSCULAR | Status: DC | PRN

## 2014-09-21 MED ORDER — MIDAZOLAM HCL 5 MG/5ML IJ SOLN
INTRAMUSCULAR | Status: DC | PRN
  Administered 2014-09-21: 1 mg via INTRAVENOUS

## 2014-09-21 MED ORDER — ONDANSETRON HCL 4 MG/2ML IJ SOLN
INTRAMUSCULAR | Status: AC
  Filled 2014-09-21: qty 2

## 2014-09-21 MED ORDER — SODIUM CHLORIDE 0.9 % IJ SOLN
INTRAMUSCULAR | Status: AC
  Filled 2014-09-21: qty 10

## 2014-09-21 MED ORDER — SUCCINYLCHOLINE CHLORIDE 20 MG/ML IJ SOLN
INTRAMUSCULAR | Status: AC
  Filled 2014-09-21: qty 1

## 2014-09-21 MED ORDER — MEPERIDINE HCL 25 MG/ML IJ SOLN: 6.2500 mg | INTRAMUSCULAR | Status: DC | PRN

## 2014-09-21 MED ORDER — LACTATED RINGERS IV SOLN
INTRAVENOUS | Status: DC | PRN
  Administered 2014-09-21: 07:00:00 via INTRAVENOUS

## 2014-09-21 MED ORDER — ALBUTEROL SULFATE HFA 108 (90 BASE) MCG/ACT IN AERS
INHALATION_SPRAY | RESPIRATORY_TRACT | Status: DC | PRN
  Administered 2014-09-21: 4 via RESPIRATORY_TRACT

## 2014-09-21 MED ORDER — ONDANSETRON HCL 4 MG/2ML IJ SOLN
INTRAMUSCULAR | Status: DC | PRN
  Administered 2014-09-21: 4 mg via INTRAVENOUS

## 2014-09-21 MED ORDER — ARTIFICIAL TEARS OP OINT
TOPICAL_OINTMENT | OPHTHALMIC | Status: AC
  Filled 2014-09-21: qty 3.5

## 2014-09-21 MED ORDER — PROPOFOL 10 MG/ML IV BOLUS
INTRAVENOUS | Status: AC
  Filled 2014-09-21: qty 20

## 2014-09-21 MED ORDER — LIDOCAINE HCL (CARDIAC) 20 MG/ML IV SOLN
INTRAVENOUS | Status: DC | PRN
  Administered 2014-09-21: 100 mg via INTRAVENOUS

## 2014-09-21 MED ORDER — ALBUTEROL SULFATE HFA 108 (90 BASE) MCG/ACT IN AERS
INHALATION_SPRAY | RESPIRATORY_TRACT | Status: AC
  Filled 2014-09-21: qty 6.7

## 2014-09-21 MED ORDER — EPHEDRINE SULFATE 50 MG/ML IJ SOLN
INTRAMUSCULAR | Status: AC
  Filled 2014-09-21: qty 1

## 2014-09-21 MED ORDER — FENTANYL CITRATE 0.05 MG/ML IJ SOLN: 25.0000 ug | INTRAMUSCULAR | Status: DC | PRN

## 2014-09-21 MED ORDER — FENTANYL CITRATE 0.05 MG/ML IJ SOLN
INTRAMUSCULAR | Status: AC
  Filled 2014-09-21: qty 5

## 2014-09-21 MED ORDER — PROPOFOL 10 MG/ML IV BOLUS
INTRAVENOUS | Status: DC | PRN
  Administered 2014-09-21: 150 mg via INTRAVENOUS
  Administered 2014-09-21: 50 mg via INTRAVENOUS

## 2014-09-21 MED ORDER — MIDAZOLAM HCL 2 MG/2ML IJ SOLN
INTRAMUSCULAR | Status: AC
  Filled 2014-09-21: qty 2

## 2014-09-21 MED ORDER — EPINEPHRINE HCL 1 MG/ML IJ SOLN
INTRAMUSCULAR | Status: AC
  Filled 2014-09-21: qty 1

## 2014-09-21 MED ORDER — 0.9 % SODIUM CHLORIDE (POUR BTL) OPTIME
TOPICAL | Status: DC | PRN
  Administered 2014-09-21: 1000 mL

## 2014-09-21 SURGICAL SUPPLY — 25 items
BALLN PULM 15 16.5 18X75 (BALLOONS)
BALLOON PULM 15 16.5 18X75 (BALLOONS) IMPLANT
BLADE SURG 15 STRL LF DISP TIS (BLADE) IMPLANT
BLADE SURG 15 STRL SS (BLADE)
CANISTER SUCTION 2500CC (MISCELLANEOUS) ×2 IMPLANT
CONT SPEC 4OZ CLIKSEAL STRL BL (MISCELLANEOUS) ×2 IMPLANT
COVER TABLE BACK 60X90 (DRAPES) ×2 IMPLANT
CRADLE DONUT ADULT HEAD (MISCELLANEOUS) IMPLANT
DRAPE PROXIMA HALF (DRAPES) ×2 IMPLANT
GAUZE SPONGE 4X4 12PLY STRL (GAUZE/BANDAGES/DRESSINGS) ×2 IMPLANT
GLOVE BIO SURGEON STRL SZ7.5 (GLOVE) ×2 IMPLANT
GOWN STRL REUS W/ TWL LRG LVL3 (GOWN DISPOSABLE) ×2 IMPLANT
GOWN STRL REUS W/TWL LRG LVL3 (GOWN DISPOSABLE) ×2
GUARD TEETH (MISCELLANEOUS) ×2 IMPLANT
KIT ROOM TURNOVER OR (KITS) ×2 IMPLANT
MARKER SKIN DUAL TIP RULER LAB (MISCELLANEOUS) IMPLANT
NEEDLE HYPO 25GX1X1/2 BEV (NEEDLE) IMPLANT
NS IRRIG 1000ML POUR BTL (IV SOLUTION) ×2 IMPLANT
PAD ARMBOARD 7.5X6 YLW CONV (MISCELLANEOUS) ×4 IMPLANT
PATTIES SURGICAL .5 X3 (DISPOSABLE) IMPLANT
SOLUTION ANTI FOG 6CC (MISCELLANEOUS) IMPLANT
SURGILUBE 2OZ TUBE FLIPTOP (MISCELLANEOUS) ×2 IMPLANT
TOWEL OR 17X24 6PK STRL BLUE (TOWEL DISPOSABLE) ×4 IMPLANT
TUBE CONNECTING 12X1/4 (SUCTIONS) ×2 IMPLANT
WATER STERILE IRR 1000ML POUR (IV SOLUTION) ×2 IMPLANT

## 2014-09-21 NOTE — Interval H&P Note (Signed)
History and Physical Interval Note:  09/21/2014 7:16 AM  Deborah [name]  has presented today for surgery, with the diagnosis of DYSPHAGIA  The various methods of treatment have been discussed with the patient and family. After consideration of risks, benefits and other options for treatment, the patient has consented to  Procedure(s): ESOPHAGOSCOPY WITH DILITATION (N/A) as a surgical intervention .  The patient's history has been reviewed, patient examined, no change in status, stable for surgery.  I have reviewed the patient's chart and labs.  Questions were answered to the patient's satisfaction.     Lesle Faron

## 2014-09-21 NOTE — Anesthesia Procedure Notes (Signed)
Procedure Name: Intubation Date/Time: 09/21/2014 7:35 AM Performed by: Barrington Ellison Pre-anesthesia Checklist: Patient identified, Emergency Drugs available, Suction available, Patient being monitored and Timeout performed Patient Re-evaluated:Patient Re-evaluated prior to inductionOxygen Delivery Method: Circle system utilized Preoxygenation: Pre-oxygenation with 100% oxygen Intubation Type: Tracheostomy Tube type: Reinforced (inserted thru trach) Tube size: 6.0 mm Number of attempts: 1 Placement Confirmation: positive ETCO2 and breath sounds checked- equal and bilateral Tube secured with: Tape

## 2014-09-21 NOTE — Anesthesia Postprocedure Evaluation (Signed)
  Anesthesia Post-op Note  Patient: Deborah [name]  Procedure(s) Performed: Procedure(s): ESOPHAGOSCOPY WITH DILITATION (N/A)  Patient Location: PACU  Anesthesia Type:General  Level of Consciousness: awake  Airway and Oxygen Therapy: Patient Spontanous Breathing and Patient connected to nasal cannula oxygen  Post-op Pain: mild  Post-op Assessment: Post-op Vital signs reviewed and Patient's Cardiovascular Status Stable  Post-op Vital Signs: Reviewed and stable  Last Vitals:  Filed Vitals:   09/21/14 0820  BP:   Temp: 36.4 C  Resp:     Complications: No apparent anesthesia complications

## 2014-09-21 NOTE — H&P (View-Only) (Signed)
  Assessment  Difficulty swallowing solids (787.20) (R13.10). History of laryngectomy (V45.89) (Z90.02). Discussed  Having progressively worsening swallowing especially with solids. 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 She also happened to lose her voice prosthesis over the weekend. She has an appointment at [facility]. She is able to phonate very nicely with finger occlusion still. On exam, the stoma looks healthy and clear without a prosthesis. No other abnormalities. Recommend esophageal dilatation under anesthesia as soon as possible. Reason For Visit  Blockage in throat. Allergies  Lactose. Current Meds  Proventil AERS (Albuterol);; RPT Benzonatate CAPS;; RPT Dulera AERO;; RPT. 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 History of THROAT PAIN_ (784.1); Resolved: 04Apr2016. History of Cellulitis of larynx (478.71) (J38.7); Resolved: 04Apr2016. History of Absence of voice (784.41) (R49.1); Resolved: 04Apr2016. PSH  Total Laryngectomy 31 Jul 2010; modified neck dissection Tracheostomy Construct TE Fistula, Insert Speech Prosthesis 05 Feb 2011. Family Hx  Acute Myocardial Infarction: Mother (V17.3) Family history of malignant neoplasm (V16.9) (Z80.9) Seasonal allergies (J30.2). Personal Hx  Former smoker (V15.82) [phone]) No alcohol use No caffeine use. Vital Signs   Recorded by Rogers,Lisa on 18 Sep 2014 03:55 PM BP:124/70,  Height: 5 ft 7.5 in, Weight: 220 lb , BMI: 33.9 kg/m2,  BMI Calculated: 33.95 ,  BSA Calculated: 2.12. Signature  Electronically signed by : [name]  M.D.; 09/18/2014 4:22 PM EST.

## 2014-09-21 NOTE — Transfer of Care (Signed)
Immediate Anesthesia Transfer of Care Note  Patient: Deborah [name]  Procedure(s) Performed: Procedure(s): ESOPHAGOSCOPY WITH DILITATION (N/A)  Patient Location: PACU  Anesthesia Type:General  Level of Consciousness: awake  Airway & Oxygen Therapy: Patient Spontanous Breathing and Patient connected to tracheostomy mask oxygen  Post-op Assessment: Report given to RN  Post vital signs: Reviewed and stable  Last Vitals:  Filed Vitals:   09/21/14 0820  BP:   Temp: 36.4 C  Resp:     Complications: No apparent anesthesia complications

## 2014-09-21 NOTE — Op Note (Signed)
OPERATIVE REPORT  DATE OF SURGERY: 09/21/2014  PATIENT:  Deborah [name],  59 y.o. female  PRE-OPERATIVE DIAGNOSIS:  DYSPHAGIA  POST-OPERATIVE DIAGNOSIS:  DYSPHAGIA  PROCEDURE:  Procedure(s): ESOPHAGOSCOPY WITH DILITATION  SURGEON:  Beckie Salts, MD  ASSISTANTS: none  ANESTHESIA:   General   EBL:  0 ml  DRAINS: none  LOCAL MEDICATIONS USED:  None  SPECIMEN:  none  COUNTS:  Correct  PROCEDURE DETAILS: The patient was taken to the operating room and placed on the operating table in the supine position. Following induction of general endotracheal anesthesia using the tracheal stoma, the table was turned 90 and the patient was draped in a standard fashion. A rigid cervical esophagoscope was entered into the oral cavity and used to visualize the hypopharynx. Is unable to pass the scope in through the esophagus and was not able to pass sufficiently to visualize the TEP. Maloney dilators were used serially from 25 [place] up to 57 [place]. The pass without difficulty. Following that I was able to pass the esophagoscope far enough to visualize the TEP and to see just beyond the stricture. Patient was then awakened extubated and transferred to recovery in stable condition.    PATIENT DISPOSITION:  To PACU, stable

## 2014-09-21 NOTE — Addendum Note (Signed)
Addendum  created 09/21/14 1442 by Barrington Ellison, CRNA   Modules edited: Anesthesia Attestations

## 2014-09-22 ENCOUNTER — Encounter (HOSPITAL_COMMUNITY): Payer: Self-pay | Admitting: Otolaryngology

## 2014-10-10 ENCOUNTER — Encounter: Payer: Self-pay | Admitting: *Deleted

## 2014-10-10 ENCOUNTER — Other Ambulatory Visit: Payer: Self-pay | Admitting: Obstetrics and Gynecology

## 2014-10-10 ENCOUNTER — Encounter: Payer: Self-pay | Admitting: Obstetrics and Gynecology

## 2014-10-10 DIAGNOSIS — E039 Hypothyroidism, unspecified: Secondary | ICD-10-CM

## 2014-10-10 MED ORDER — LEVOTHYROXINE SODIUM 150 MCG PO TABS: 150.0000 ug | ORAL_TABLET | Freq: Every day | ORAL | Status: DC

## 2014-10-10 NOTE — Progress Notes (Signed)
Letter mailed to patient asking her to contact us for an appt. Deborah [name],CMA

## 2014-10-10 NOTE — Progress Notes (Signed)
Please have patient schedule an appointment to see me next month. I would like to re-test and follow-up on her thyroid and other medical conditions.

## 2014-10-13 ENCOUNTER — Other Ambulatory Visit: Payer: Self-pay | Admitting: Obstetrics and Gynecology

## 2014-10-13 DIAGNOSIS — J441 Chronic obstructive pulmonary disease with (acute) exacerbation: Secondary | ICD-10-CM

## 2014-10-13 MED ORDER — MOMETASONE FURO-FORMOTEROL FUM 200-5 MCG/ACT IN AERO: 2.0000 | INHALATION_SPRAY | Freq: Two times a day (BID) | RESPIRATORY_TRACT | Status: DC

## 2014-10-13 MED ORDER — ALBUTEROL SULFATE HFA 108 (90 BASE) MCG/ACT IN AERS: INHALATION_SPRAY | RESPIRATORY_TRACT | Status: DC

## 2014-10-13 NOTE — Telephone Encounter (Signed)
Patient need refill on her ibuprofen as well as inhalers

## 2014-10-16 ENCOUNTER — Other Ambulatory Visit: Payer: Self-pay | Admitting: Obstetrics and Gynecology

## 2014-11-03 ENCOUNTER — Ambulatory Visit (INDEPENDENT_AMBULATORY_CARE_PROVIDER_SITE_OTHER): Payer: Medicaid Other | Admitting: Obstetrics and Gynecology

## 2014-11-03 ENCOUNTER — Encounter: Payer: Self-pay | Admitting: Obstetrics and Gynecology

## 2014-11-03 VITALS — BP 154/100 | HR 89 | Temp 98.1°F | Ht 67.0 in | Wt 222.0 lb

## 2014-11-03 DIAGNOSIS — D638 Anemia in other chronic diseases classified elsewhere: Secondary | ICD-10-CM | POA: Diagnosis not present

## 2014-11-03 DIAGNOSIS — I10 Essential (primary) hypertension: Secondary | ICD-10-CM | POA: Diagnosis not present

## 2014-11-03 DIAGNOSIS — R1310 Dysphagia, unspecified: Secondary | ICD-10-CM | POA: Diagnosis not present

## 2014-11-03 DIAGNOSIS — J441 Chronic obstructive pulmonary disease with (acute) exacerbation: Secondary | ICD-10-CM | POA: Diagnosis present

## 2014-11-03 DIAGNOSIS — E038 Other specified hypothyroidism: Secondary | ICD-10-CM | POA: Diagnosis not present

## 2014-11-03 DIAGNOSIS — E039 Hypothyroidism, unspecified: Secondary | ICD-10-CM | POA: Diagnosis not present

## 2014-11-03 LAB — COMPREHENSIVE METABOLIC PANEL
ALBUMIN: 4.4 g/dL (ref 3.5–5.2)
ALT: 12 U/L (ref 0–35)
AST: 16 U/L (ref 0–37)
Alkaline Phosphatase: 87 U/L (ref 39–117)
BUN: 16 mg/dL (ref 6–23)
CO2: 25 meq/L (ref 19–32)
Calcium: 9.4 mg/dL (ref 8.4–10.5)
Chloride: 104 mEq/L (ref 96–112)
Creat: 1.04 mg/dL (ref 0.50–1.10)
Glucose, Bld: 79 mg/dL (ref 70–99)
Potassium: 4.1 mEq/L (ref 3.5–5.3)
Sodium: 138 mEq/L (ref 135–145)
TOTAL PROTEIN: 8.5 g/dL — AB (ref 6.0–8.3)
Total Bilirubin: 0.3 mg/dL (ref 0.2–1.2)

## 2014-11-03 LAB — CBC WITH DIFFERENTIAL/PLATELET
BASOS ABS: 0.1 10*3/uL (ref 0.0–0.1)
Basophils Relative: 1 % (ref 0–1)
EOS PCT: 4 % (ref 0–5)
Eosinophils Absolute: 0.3 10*3/uL (ref 0.0–0.7)
HEMATOCRIT: 34.2 % — AB (ref 36.0–46.0)
Hemoglobin: 11.1 g/dL — ABNORMAL LOW (ref 12.0–15.0)
Lymphocytes Relative: 29 % (ref 12–46)
Lymphs Abs: 1.8 10*3/uL (ref 0.7–4.0)
MCH: 29.2 pg (ref 26.0–34.0)
MCHC: 32.5 g/dL (ref 30.0–36.0)
MCV: 90 fL (ref 78.0–100.0)
MPV: 9.6 fL (ref 8.6–12.4)
Monocytes Absolute: 0.4 10*3/uL (ref 0.1–1.0)
Monocytes Relative: 7 % (ref 3–12)
NEUTROS ABS: 3.7 10*3/uL (ref 1.7–7.7)
Neutrophils Relative %: 59 % (ref 43–77)
PLATELETS: 400 10*3/uL (ref 150–400)
RBC: 3.8 MIL/uL — ABNORMAL LOW (ref 3.87–5.11)
RDW: 15.7 % — AB (ref 11.5–15.5)
WBC: 6.3 10*3/uL (ref 4.0–10.5)

## 2014-11-03 LAB — TSH: TSH: 57.069 u[IU]/mL — ABNORMAL HIGH (ref 0.350–4.500)

## 2014-11-03 MED ORDER — LOSARTAN POTASSIUM 100 MG PO TABS: 100.0000 mg | ORAL_TABLET | Freq: Every day | ORAL | Status: DC

## 2014-11-03 MED ORDER — CYCLOBENZAPRINE HCL 10 MG PO TABS: ORAL_TABLET | ORAL | Status: DC

## 2014-11-03 MED ORDER — ONDANSETRON 4 MG PO TBDP: ORAL_TABLET | ORAL | Status: DC

## 2014-11-03 MED ORDER — IBUPROFEN 600 MG PO TABS: 600.0000 mg | ORAL_TABLET | Freq: Three times a day (TID) | ORAL | Status: DC | PRN

## 2014-11-03 MED ORDER — TIOTROPIUM BROMIDE MONOHYDRATE 18 MCG IN CAPS: 18.0000 ug | ORAL_CAPSULE | Freq: Every day | RESPIRATORY_TRACT | Status: DC

## 2014-11-03 MED ORDER — DM-GUAIFENESIN ER 30-600 MG PO TB12: 1.0000 | ORAL_TABLET | Freq: Two times a day (BID) | ORAL | Status: DC | PRN

## 2014-11-03 MED ORDER — DOXYCYCLINE HYCLATE 100 MG PO TABS: 100.0000 mg | ORAL_TABLET | Freq: Two times a day (BID) | ORAL | Status: DC

## 2014-11-03 NOTE — Progress Notes (Signed)
Subjective: Chief Complaint  Patient presents with  . Follow-up    breathing    HPI: Deborah [name] is a 59 y.o. presenting to clinic today to discuss the following:  #Dysphagia: -Had dilation of esophagus -said it was painful afterwards and she had a sore throat -symptoms improved some but still present -has follow-up soon and may get another dilation but is hesitant   #Dyspnea/Coughing: -said cough medicine prescribed did not help last time -wants different medication -sometimes vomiting with cough  -takes nausea pills which help -does not believe cough due to aspiration -states she is using her inhaler a lot more; does not believe it is helping with SOB -increased SOB -denies fevers, chills, weight loss  #Hypertension: Taking Meds: Yes Side effects: None ROS: Denies headache, dizziness, visual changes, chest pain, abdominal pain    ROS per HPI.  Past Medical, Surgical, Social, and Family History Reviewed & Updated per EMR.   Objective: BP 154/100 mmHg  Pulse 89  Temp(Src) 98.1 F (36.7 C) (Oral)  Ht 5' 7"  (1.702 m)  Wt 222 lb (100.699 kg)  BMI 34.76 kg/m2  Physical Exam  Constitutional: She is oriented to person, place, and time and well-developed, well-nourished, and in no distress.  HENT:  Head: Normocephalic and atraumatic.  Eyes: EOM are normal.  Neck:  Trach noted. Decreased ROM of neck. Unable to access thyroid   Cardiovascular: Normal rate, regular rhythm, normal heart sounds and intact distal pulses.   Pulmonary/Chest: Effort normal. No respiratory distress. She has no wheezes.  Diminished breath sounds  Abdominal: Soft. Bowel sounds are normal.  Musculoskeletal: Normal range of motion. She exhibits no edema.  Neurological: She is alert and oriented to person, place, and time.  No focal deficits appreciated  Skin: Skin is warm and dry.  Psychiatric: Mood and affect normal.    Results for orders placed or performed in visit on 11/03/14  (from the past 72 hour(s))  TSH     Status: Abnormal   Collection Time: 11/03/14  3:36 PM  Result Value Ref Range   TSH 57.069 (H) 0.350 - 4.500 uIU/mL  CBC with Differential/Platelet     Status: Abnormal   Collection Time: 11/03/14  3:36 PM  Result Value Ref Range   WBC 6.3 4.0 - 10.5 K/uL   RBC 3.80 (L) 3.87 - 5.11 MIL/uL   Hemoglobin 11.1 (L) 12.0 - 15.0 g/dL   HCT 34.2 (L) 36.0 - 46.0 %   MCV 90.0 78.0 - 100.0 fL   MCH 29.2 26.0 - 34.0 pg   MCHC 32.5 30.0 - 36.0 g/dL   RDW 15.7 (H) 11.5 - 15.5 %   Platelets 400 150 - 400 K/uL   MPV 9.6 8.6 - 12.4 fL   Neutrophils Relative % 59 43 - 77 %   Neutro Abs 3.7 1.7 - 7.7 K/uL   Lymphocytes Relative 29 12 - 46 %   Lymphs Abs 1.8 0.7 - 4.0 K/uL   Monocytes Relative 7 3 - 12 %   Monocytes Absolute 0.4 0.1 - 1.0 K/uL   Eosinophils Relative 4 0 - 5 %   Eosinophils Absolute 0.3 0.0 - 0.7 K/uL   Basophils Relative 1 0 - 1 %   Basophils Absolute 0.1 0.0 - 0.1 K/uL   Smear Review Criteria for review not met   Comprehensive metabolic panel     Status: Abnormal   Collection Time: 11/03/14  3:36 PM  Result Value Ref Range   Sodium 138  135 - 145 mEq/L   Potassium 4.1 3.5 - 5.3 mEq/L   Chloride 104 96 - 112 mEq/L   CO2 25 19 - 32 mEq/L   Glucose, Bld 79 70 - 99 mg/dL   BUN 16 6 - 23 mg/dL   Creat 1.04 0.50 - 1.10 mg/dL   Total Bilirubin 0.3 0.2 - 1.2 mg/dL   Alkaline Phosphatase 87 39 - 117 U/L   AST 16 0 - 37 U/L   ALT 12 0 - 35 U/L   Total Protein 8.5 (H) 6.0 - 8.3 g/dL   Albumin 4.4 3.5 - 5.2 g/dL   Calcium 9.4 8.4 - 10.5 mg/dL    Assessment/Plan: Please see problem based Assessment and Plan   Orders Placed This Encounter  Procedures  . TSH  . CBC with Differential/Platelet  . Comprehensive metabolic panel    Meds ordered this encounter  Medications  . ondansetron (ZOFRAN-ODT) 4 MG disintegrating tablet    Sig: DISSOLVE ONE TABLET IN MOUTH EVERY 8 HOURS AS NEEDED FOR NAUSEA    Dispense:  60 tablet    Refill:  0  .  tiotropium (SPIRIVA HANDIHALER) 18 MCG inhalation capsule    Sig: Place 1 capsule (18 mcg total) into inhaler and inhale daily.    Dispense:  30 capsule    Refill:  12  . doxycycline (VIBRA-TABS) 100 MG tablet    Sig: Take 1 tablet (100 mg total) by mouth 2 (two) times daily.    Dispense:  14 tablet    Refill:  0  . losartan (COZAAR) 100 MG tablet    Sig: Take 1 tablet (100 mg total) by mouth daily.    Dispense:  30 tablet    Refill:  3  . dextromethorphan-guaiFENesin (MUCINEX DM) 30-600 MG per 12 hr tablet    Sig: Take 1 tablet by mouth 2 (two) times daily as needed for cough.    Dispense:  30 tablet    Refill:  2  . cyclobenzaprine (FLEXERIL) 10 MG tablet    Sig: TAKE ONE TABLET BY MOUTH TWICE DAILY AS NEEDED FOR MUSCLE SPASM    Dispense:  30 tablet    Refill:  2  . ibuprofen (ADVIL,MOTRIN) 600 MG tablet    Sig: Take 1 tablet (600 mg total) by mouth every 8 (eight) hours as needed for moderate pain.    Dispense:  30 tablet    Refill:  [place], DO 11/05/2014, 11:46 AM PGY-1, [place]

## 2014-11-03 NOTE — Patient Instructions (Addendum)
Here are some of the things we discussed today: -I am treating you for a COPD exacerbation with antibiotic to be taken for the next 7 days - i think this is a mild case -continue to take your steroids -I have increased your BP medication, please schedule a nurse visit to have your blood pressure rechecked ion Monday. -I will contact you about your lab results I may need to adjust your medications  New medications: -Spiriva = to help with breathing  Please schedule a follow-up appointment for 3-4 weeks  Thanks for allowing me to be a part of your care! Dr. Gerarda Fraction    Chronic Obstructive Pulmonary Disease Exacerbation  Chronic obstructive pulmonary disease (COPD) is a common lung problem. In COPD, the flow of air from the lungs is limited. COPD exacerbations are times that breathing gets worse and you need extra treatment. Without treatment they can be life threatening. If they happen often, your lungs can become more damaged. HOME CARE Do not smoke. Avoid tobacco smoke and other things that bother your lungs. If given, take your antibiotic medicine as told. Finish the medicine even if you start to feel better. Only take medicines as told by your doctor. Drink enough fluids to keep your pee (urine) clear or pale yellow (unless your doctor has told you not to). Use a cool mist machine (vaporizer). If you use oxygen or a machine that turns liquid medicine into a mist (nebulizer), continue to use them as told. Keep up with shots (vaccinations) as told by your doctor. Exercise regularly. Eat healthy foods. Keep all doctor visits as told. GET HELP RIGHT AWAY IF: You are very short of breath and it gets worse. You have trouble talking. You have bad chest pain. You have blood in your spit (sputum). You have a fever. You keep throwing up (vomiting). You feel weak, or you pass out (faint). You feel confused. You keep getting worse. MAKE SURE YOU:  Understand these instructions. Will  watch your condition. Will get help right away if you are not doing well or get worse. Document Released: 05/22/2011 Document Revised: 03/23/2013 Document Reviewed: 02/04/2013 [facility] Patient Information 2015 [place], [place]. This information is not intended to replace advice given to you by your health care provider. Make sure you discuss any questions you have with your health care provider.  e your lungs (pulmonary rehabilitation) if told by your doctor. If you need home oxygen therapy, ask your doctor if you should buy a tool to measure your oxygen level (oximeter).  Avoid people who have a sickness you can catch (contagious).  Avoid going outside when it is very hot, cold, or humid.  Eat healthy foods. Eat smaller meals more often. Rest before meals.  Stay active, but remember to also rest.  Make sure to get all the shots (vaccines) your doctor recommends. Ask your doctor if you need a pneumonia shot.  Learn and use tips on how to relax.  Learn and use tips on how to control your breathing as told by your doctor. Try:  Breathing in (inhaling) through your nose for 1 second. Then, pucker your lips and breath out (exhale) through your lips for 2 seconds.  Putting one hand on your belly (abdomen). Breathe in slowly through your nose for 1 second. Your hand on your belly should move out. Pucker your lips and breathe out slowly through your lips. Your hand on your belly should move in as you breathe out.  Learn and use controlled coughing to  clear thick spit from your lungs. The steps are: 1. Lean your head a little forward. 2. Breathe in deeply. 3. Try to hold your breath for 3 seconds. 4. Keep your mouth slightly open while coughing 2 times. 5. Spit any thick spit out into a tissue. 6. Rest and do the steps again 1 or 2 times as needed. GET HELP IF:  You cough up more thick spit than usual.  There is a change in the color or thickness of the spit.  It is harder to breathe  than usual.  Your breathing is faster than usual. GET HELP RIGHT AWAY IF:   You have shortness of breath while resting.  You have shortness of breath that stops you from:  Being able to talk.  Doing normal activities.  You chest hurts for longer than 5 minutes.  Your skin color is more blue than usual.  Your pulse oximeter shows that you have low oxygen for longer than 5 minutes. MAKE SURE YOU:   Understand these instructions.  Will watch your condition.  Will get help right away if you are not doing well or get worse. Document Released: 11/19/2007 Document Revised: 10/17/2013 Document Reviewed: 01/27/2013 [facility] Patient Information 2015 [place], [place]. This information is not intended to replace advice given to you by your health care provider. Make sure you discuss any questions you have with your health care provider.

## 2014-11-05 ENCOUNTER — Encounter: Payer: Self-pay | Admitting: Obstetrics and Gynecology

## 2014-11-05 NOTE — Assessment & Plan Note (Signed)
Repeat TSH collected and pt with continued elevation. Will add on T3 and T4. Will increase dose of prescription and encourage continued compliance with medication. Will follow-up another check in one month if continues to be elevated despite increased dose and compliance will perform thyroid scan and consider ablation therapy or referral. Will review medications for drug interactions.

## 2014-11-05 NOTE — Assessment & Plan Note (Signed)
A: BP elevated today x2.  P: Increased Losartan dose to 100mg . Patient to schedule nurse visit for BP recheck on Monday.  To continue metoprolol as well. May need 3rd agent in place if still not controlled at next check. Patient encouraged to monitor BPs.

## 2014-11-05 NOTE — Assessment & Plan Note (Signed)
A: Asymptomatic P: Recheck CBC with stable Hbg

## 2014-11-05 NOTE — Assessment & Plan Note (Signed)
>>  ASSESSMENT AND PLAN FOR DYSPHAGIA WRITTEN ON 11/05/2014 11:41 AM BY Tyheem Boughner Y, DO  A: s/p dilation. Continues to endorse symptoms that are slightly better but still present P: Pt to follow up with physician who did dilation in about a week. Plan is for another procedure, however patient is hesitant on wanting it done again.

## 2014-11-05 NOTE — Assessment & Plan Note (Addendum)
A: Appears to have mild acute COPD exacerbation with productive cough, increased use of inhaler, and increased dyspnea. No fevers. P: Patient to continue her long term steroids. Will add doxycycline. Also added Spiriva to patient's medications to help better control COPD. Rx given for cough medication. Patient to return to clinic if symptoms not improved in one week.

## 2014-11-05 NOTE — Assessment & Plan Note (Signed)
A: s/p dilation. Continues to endorse symptoms that are slightly better but still present P: Pt to follow up with physician who did dilation in about a week. Plan is for another procedure, however patient is hesitant on wanting it done again.

## 2014-11-06 ENCOUNTER — Ambulatory Visit (INDEPENDENT_AMBULATORY_CARE_PROVIDER_SITE_OTHER): Payer: Medicaid Other | Admitting: Family Medicine

## 2014-11-06 ENCOUNTER — Ambulatory Visit (INDEPENDENT_AMBULATORY_CARE_PROVIDER_SITE_OTHER): Payer: Medicaid Other | Admitting: *Deleted

## 2014-11-06 ENCOUNTER — Other Ambulatory Visit: Payer: Self-pay | Admitting: Obstetrics and Gynecology

## 2014-11-06 ENCOUNTER — Encounter: Payer: Self-pay | Admitting: Family Medicine

## 2014-11-06 VITALS — BP 158/90 | HR 70 | Temp 98.2°F | Ht 67.0 in | Wt 222.1 lb

## 2014-11-06 VITALS — BP 158/90 | HR 70

## 2014-11-06 DIAGNOSIS — Z0130 Encounter for examination of blood pressure without abnormal findings: Secondary | ICD-10-CM

## 2014-11-06 DIAGNOSIS — I10 Essential (primary) hypertension: Secondary | ICD-10-CM | POA: Diagnosis not present

## 2014-11-06 DIAGNOSIS — Z136 Encounter for screening for cardiovascular disorders: Secondary | ICD-10-CM

## 2014-11-06 DIAGNOSIS — R519 Headache, unspecified: Secondary | ICD-10-CM

## 2014-11-06 DIAGNOSIS — R51 Headache: Secondary | ICD-10-CM

## 2014-11-06 DIAGNOSIS — E039 Hypothyroidism, unspecified: Secondary | ICD-10-CM

## 2014-11-06 MED ORDER — PROMETHAZINE HCL 25 MG/ML IJ SOLN
12.5000 mg | Freq: Once | INTRAMUSCULAR | Status: AC
  Administered 2014-11-06: 12.5 mg via INTRAMUSCULAR

## 2014-11-06 MED ORDER — LEVOTHYROXINE SODIUM 175 MCG PO TABS: 175.0000 ug | ORAL_TABLET | Freq: Every day | ORAL | Status: DC

## 2014-11-06 MED ORDER — METHYLPREDNISOLONE ACETATE 40 MG/ML IJ SUSP
40.0000 mg | Freq: Once | INTRAMUSCULAR | Status: AC
  Administered 2014-11-06: 40 mg via INTRAMUSCULAR

## 2014-11-06 NOTE — Patient Instructions (Addendum)
It was nice to meet you today!  Gave you two shots: -depomedrol = steroid medication to help your headache -phenergan = anti nausea medicine that will help headache and help you sleep  Come back on Friday for nurse visit and lab draw. We will recheck your BP then.  If headache worsens, you vomit, have fever, have stiff neck, weakness, speech problems, or any other issues return to be seen or go immediately to ER.  Be well, Dr. Ardelia Mems

## 2014-11-06 NOTE — Progress Notes (Signed)
Patient ID: Deborah [name], female   DOB: Apr 07, 1956, 59 y.o.   MRN: [id number]  HPI:  Pt presents for a same day appointment to discuss   HEADACHE   Onset: 3 days ago   Location: Across front of head bilaterally  Frequency: Continuous  Precipitating factors: None  Severity: 7/10 presently Prior treatment: took ibuprofen 800mg  pills, hasn't helped at all. Last took ibuprofen at 11am this morning  Associated Symptoms Nausea/vomiting: no  Photophobia/phonophobia: no  Tearing of eyes: no  Sinus pain/pressure: no  Family hx migraine: no  Personal stressors: no  Relation to menstrual cycle: no   Red Flags Fever: no  Neck pain/stiffness: no  Vision/speech/swallow/hearing difficulty: no  Focal weakness/numbness: no  Altered mental status: no  Trauma: no  New type of headache: yes  Anticoagulant use: no  H/o cancer/HIV/Pregnancy: yes - hx of supraglottis cancer s/p trachelectomy  ROS: See HPI  [place]: hx HTN, COPD, hyopthyroidism, chronic pain, s/p trachelectomy, supraglottis cancer, anxiety, anemia  PHYSICAL EXAM: BP 158/90 mmHg  Pulse 70  Temp(Src) 98.2 F (36.8 C) (Oral)  Ht 5\' 7"  (1.702 m)  Wt 222 lb 1.6 oz (100.744 kg)  BMI 34.78 kg/m2 Gen: NAD, pleasant, cooperative, smiling at appropriate times HEENT: NCAT, PERRL, EOMI. S/p tracheostomy Heart: RRR no murmur Lungs: CTAB NWOB Abdomen: soft NTTP Neuro: cranial nerves II-XII tested and intact. Speech fluent (with tracheostomy). Full strength bilat upper and lower ext. Normal FNF. Negative romberg.  ASSESSMENT/PLAN:  1. Headache: new in onset. Sx's are consistent with migraine vs tension headache. Not improved with ibuprofen. Neuro exam normal today. Will do trial of headache injectible cocktail here in clinic with phenergan and depomedrol. Unable to do toradol given that pt has taken high dose nsaid's over the last couple of days. Counseled pt extensively on reasons to return to care per AVS. If persists beyond  several days may need neuroimaging.  2. Hypertension: recently had increase in losartan and has not yet had repeat bloodwork. Was here today for BP check when she complained of headache and was thus scheduled for same day MD appointment. Suspect headache is contributing to elevated BP today. - treat headache as above - return in 5 days for BMET and RN BP check  FOLLOW UP: F/u as needed if symptoms worsen or do not improve.   [place]. Ardelia Mems, [place]

## 2014-11-06 NOTE — Addendum Note (Signed)
Addended by: Katheren Shams on: 11/06/2014 03:44 PM   Modules accepted: Orders

## 2014-11-06 NOTE — Progress Notes (Signed)
   Pt in nurse clinic for blood pressure check.  BP 168/98 left arm and 158/90 right arm, heart rate 70-73.  Pt complained of constant headache x 3 days with no relief with Ibuprofen 800 mg.  Pt stated having headaches are very unusual for her.  Pt denies any chest pain, SOB, dizziness or visual changes.  Precept with Dr. Ree Kida; pt to see same day provider for headaches.  Will forward to PCP.  Derl Barrow, RN

## 2014-11-07 ENCOUNTER — Ambulatory Visit: Payer: Self-pay | Admitting: Family Medicine

## 2014-11-09 NOTE — Progress Notes (Signed)
I was preceptor for this office visit.  

## 2014-11-10 NOTE — Progress Notes (Signed)
I was the preceptor on the day of this visit.   Kalon Erhardt MD  

## 2014-12-10 ENCOUNTER — Other Ambulatory Visit: Payer: Self-pay | Admitting: Obstetrics and Gynecology

## 2014-12-10 DIAGNOSIS — E039 Hypothyroidism, unspecified: Secondary | ICD-10-CM

## 2014-12-12 MED ORDER — METOPROLOL TARTRATE 25 MG PO TABS: ORAL_TABLET | ORAL | Status: DC

## 2014-12-12 MED ORDER — LEVOTHYROXINE SODIUM 175 MCG PO TABS: 175.0000 ug | ORAL_TABLET | Freq: Every day | ORAL | Status: DC

## 2015-01-04 ENCOUNTER — Encounter: Payer: Self-pay | Admitting: Family Medicine

## 2015-01-04 ENCOUNTER — Ambulatory Visit (INDEPENDENT_AMBULATORY_CARE_PROVIDER_SITE_OTHER): Payer: Medicaid Other | Admitting: Family Medicine

## 2015-01-04 VITALS — BP 160/98 | HR 85 | Temp 98.2°F | Ht 67.0 in | Wt 220.6 lb

## 2015-01-04 DIAGNOSIS — I10 Essential (primary) hypertension: Secondary | ICD-10-CM

## 2015-01-04 DIAGNOSIS — M170 Bilateral primary osteoarthritis of knee: Secondary | ICD-10-CM | POA: Insufficient documentation

## 2015-01-04 DIAGNOSIS — M25562 Pain in left knee: Secondary | ICD-10-CM | POA: Diagnosis present

## 2015-01-04 DIAGNOSIS — M25569 Pain in unspecified knee: Secondary | ICD-10-CM | POA: Insufficient documentation

## 2015-01-04 MED ORDER — METHYLPREDNISOLONE ACETATE 40 MG/ML IJ SUSP
40.0000 mg | Freq: Once | INTRAMUSCULAR | Status: AC
  Administered 2015-01-04: 40 mg via INTRA_ARTICULAR

## 2015-01-04 NOTE — Patient Instructions (Signed)

## 2015-01-04 NOTE — Progress Notes (Signed)
Subjective:     Patient ID: Deborah [name], female   DOB: 31-Jul-1955, 59 y.o.   MRN: [id number]  HPI Deborah [name] is a 59yo female presenting with left knee pain. - Pain located throughout left knee, worse on medial anterior aspect - First noted mild intermittent pain last week. Pain became worse on Monday (7/18) - Notes mild swelling, giving out, locking - Has tried Ibuprofen scheduled, heat, and cold without relief, "crackles" with movement - Pain worse when walking, when changing positions - Denies history of trauma - Denies fever, warmth, erythema - No history of gout  Chart Review: 12/23/13 - Frequent fall with right knee pain. Fell end of May 2015 - Ibuprofen prescribed. Xray ordered. Referred to PT for assessment.  01/18/14 - Right knee pain after series of falls.  - Right knee xray without fracture - Improved knee pain with ibuprofen  Review of Systems  Constitutional: Negative for fever.  Musculoskeletal: Positive for joint swelling, arthralgias and gait problem.       Objective:   Physical Exam  Constitutional: She is oriented to person, place, and time. She appears well-developed and well-nourished. No distress.  Neck:  Trach in place  Cardiovascular: Normal rate and regular rhythm.  Exam reveals no gallop and no friction rub.   No murmur heard. Pulmonary/Chest: Effort normal. No respiratory distress. She has no wheezes.  Abdominal: Soft. She exhibits no distension. There is no tenderness.  Musculoskeletal:       Legs: Reports decrease in passive and active ROM of left knee, but able to move through full ROM when prompted. McMurray's limited due to pain, however diffuse pain noted. Diffuse pain noted in all aspect with palpation, worse over medial anterior aspect. Crepitus noted in knees bilaterally.  Neurological: She is alert and oriented to person, place, and time.  Skin:  No erythema or increased warmth noted over left knee  Psychiatric:  Pain appears  to be real to certain extent but suspect some embellishment       Assessment:     Please refer to Problem List for Assessment.     Plan:     Please refer to Problem List for Plan.

## 2015-01-04 NOTE — Assessment & Plan Note (Signed)
-   Elevated initially and on recheck - Significant pain demonstrated on exam today. May be altering findings - Recheck at next visit. May need additional antihypertensive

## 2015-01-04 NOTE — Assessment & Plan Note (Signed)
-   Xrays ordered.  - Failed Tylenol and NSAIDs - Will give steroid shot in left knee today  - Written consent obtained. Risks discussed.  - Injection with lidocaine given in lateral aspect  - Observed following injection. No complications noted. - Follow up with PCP if no improvement

## 2015-01-04 NOTE — Assessment & Plan Note (Signed)
>>  ASSESSMENT AND PLAN FOR KNEE PAIN WRITTEN ON 01/04/2015  8:34 PM BY RUMLEY, [place] N, DO  - Xrays ordered.  - Failed Tylenol and NSAIDs - Will give steroid shot in left knee today  - Written consent obtained. Risks discussed.  - Injection with lidocaine given in lateral aspect  - Observed following injection. No complications noted. - Follow up with PCP if no improvement

## 2015-01-05 ENCOUNTER — Ambulatory Visit (HOSPITAL_COMMUNITY)
Admission: RE | Admit: 2015-01-05 | Discharge: 2015-01-05 | Disposition: A | Discharge status: Home / Self Care | Payer: Medicaid Other | Source: Ambulatory Visit | Attending: Family Medicine | Admitting: Family Medicine

## 2015-01-05 ENCOUNTER — Other Ambulatory Visit: Payer: Self-pay | Admitting: Family Medicine

## 2015-01-05 DIAGNOSIS — M1990 Unspecified osteoarthritis, unspecified site: Secondary | ICD-10-CM | POA: Diagnosis not present

## 2015-01-05 DIAGNOSIS — M25462 Effusion, left knee: Secondary | ICD-10-CM | POA: Insufficient documentation

## 2015-01-05 DIAGNOSIS — M25562 Pain in left knee: Secondary | ICD-10-CM

## 2015-01-09 ENCOUNTER — Ambulatory Visit (HOSPITAL_COMMUNITY): Payer: Medicaid - Dental | Admitting: Dentistry

## 2015-01-09 ENCOUNTER — Encounter (HOSPITAL_COMMUNITY): Payer: Self-pay | Admitting: Dentistry

## 2015-01-09 VITALS — BP 137/71 | HR 79 | Temp 98.2°F

## 2015-01-09 DIAGNOSIS — R682 Dry mouth, unspecified: Secondary | ICD-10-CM

## 2015-01-09 DIAGNOSIS — Z463 Encounter for fitting and adjustment of dental prosthetic device: Secondary | ICD-10-CM

## 2015-01-09 DIAGNOSIS — Z923 Personal history of irradiation: Secondary | ICD-10-CM

## 2015-01-09 DIAGNOSIS — K000 Anodontia: Secondary | ICD-10-CM

## 2015-01-09 DIAGNOSIS — Z972 Presence of dental prosthetic device (complete) (partial): Secondary | ICD-10-CM

## 2015-01-09 DIAGNOSIS — K0820 Unspecified atrophy of edentulous alveolar ridge: Secondary | ICD-10-CM

## 2015-01-09 DIAGNOSIS — Z90710 Acquired absence of both cervix and uterus: Secondary | ICD-10-CM

## 2015-01-09 DIAGNOSIS — K08109 Complete loss of teeth, unspecified cause, unspecified class: Secondary | ICD-10-CM

## 2015-01-09 DIAGNOSIS — K117 Disturbances of salivary secretion: Secondary | ICD-10-CM

## 2015-01-09 DIAGNOSIS — C321 Malignant neoplasm of supraglottis: Secondary | ICD-10-CM

## 2015-01-09 NOTE — Patient Instructions (Signed)
Plan:  1. Patient to followup with dental medicine as scheduled for yearly recall .  2. Patient to keep dentures out if sore spots arise. Use salt water rinses as needed aid healing. Call if problems arise before next scheduled appointment.   Lenn Cal, DDS

## 2015-01-09 NOTE — Progress Notes (Signed)
01/09/2015  Patient:            Deborah [name] Date of Birth:  1956/03/18 MRN:                [id number]  BP 137/71 mmHg  Pulse 79  Temp(Src) 98.2 F (36.8 C) (Oral)  Deborah [name] is a 59 year old female that presents for periodic oral exam and evaluation of upper and lower complete dentures.   Medical Hx Update:  Past Medical History  Diagnosis Date  . Hypertension   . Tracheostomy dependent   . History of laryngectomy   . Bronchitis   . 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  [name] Heart murmur     asymptomatic   . Leukocytopenia   . Asthma   . Sinusitis, chronic 07/20/2011  . Normal MRI 07/14/11    negative for mestasis   . Hx of radiation therapy 09/03/10 to 10/16/2010    supraglottic larynx  . 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 Neck pain 01/21/2012  . GERD (gastroesophageal reflux disease)   . Sepsis 08/04/12  . Seizures     07/24/11 off Effexor w/o seizure  . Anemia   . Sinusitis, chronic 07/20/2011    Bilateral maxillary, identified on MRI of head 07/14/11.    . Diverticulosis 11/15/2012     noted on screening colonoscopy   . Internal hemorrhoid 11/15/2012     small, noted on screening colonoscopy   . Nausea alone 07/28/2013  . COPD (chronic obstructive pulmonary disease)   . Esophageal stricture   . ALLERGIES/ADVERSE DRUG REACTIONS: Allergies  Allergen Reactions  . Lisinopril Cough   MEDICATIONS: Current Outpatient Prescriptions  Medication Sig Dispense Refill  . albuterol (PROVENTIL HFA) 108 (90 BASE) MCG/ACT inhaler INHALE 2 PUFFS INTO THE LUNGS EVERY 4 HOURS AS NEEDED FOR WHEEZING OR SHORTNESS OF BREATH. 2 each 2  . buPROPion (WELLBUTRIN XL) 150 MG 24 hr tablet Take 2 tablets (300 mg total) by mouth daily. 60 tablet 4  . cyclobenzaprine (FLEXERIL) 10 MG tablet TAKE ONE TABLET BY MOUTH TWICE DAILY AS NEEDED FOR MUSCLE SPASM 30 tablet 2  . dextromethorphan-guaiFENesin (MUCINEX DM) 30-600 MG per 12 hr tablet Take 1  tablet by mouth 2 (two) times daily as needed for cough. 30 tablet 2  . doxycycline (VIBRA-TABS) 100 MG tablet Take 1 tablet (100 mg total) by mouth 2 (two) times daily. 14 tablet 0  . EQ LORATADINE 10 MG tablet TAKE ONE TABLET BY MOUTH ONCE DAILY 30 tablet 0  . EQ LORATADINE 10 MG tablet TAKE ONE TABLET BY MOUTH ONCE DAILY 30 tablet 6  . ibuprofen (ADVIL,MOTRIN) 600 MG tablet Take 1 tablet (600 mg total) by mouth every 8 (eight) hours as needed for moderate pain. 30 tablet 2  . levothyroxine (SYNTHROID, LEVOTHROID) 175 MCG tablet Take 1 tablet (175 mcg total) by mouth daily before breakfast. 30 tablet 3  . loratadine (CLARITIN) 10 MG tablet Take 10 mg by mouth daily.    [name] losartan (COZAAR) 100 MG tablet Take 1 tablet (100 mg total) by mouth daily. 30 tablet 3  . MELATONIN PO Take 1 tablet by mouth at bedtime as needed (sleep).    . metoprolol tartrate (LOPRESSOR) 25 MG tablet TAKE ONE TABLET BY MOUTH TWICE DAILY 60 tablet 3  . mometasone-formoterol (DULERA) 200-5 MCG/ACT AERO Inhale 2 puffs into the lungs 2 (two) times daily. 1 Inhaler 6  . omeprazole (PRILOSEC)  40 MG capsule TAKE ONE CAPSULE BY MOUTH IN THE MORNING. TAKE  AT LEAST  FOUR  HOURS  AFTER  TAKING  LEVOTHYROXINE. 30 capsule 0  . omeprazole (PRILOSEC) 40 MG capsule TAKE ONE CAPSULE BY MOUTH IN THE MORNING. TAKE AT LEAST FOUR HOURS AFTER TAKING LEVOTHYROXINE. 30 capsule 2  . ondansetron (ZOFRAN-ODT) 4 MG disintegrating tablet DISSOLVE ONE TABLET IN MOUTH EVERY 8 HOURS AS NEEDED FOR NAUSEA 60 tablet 0  . predniSONE (DELTASONE) 20 MG tablet Take 2 tablets (40 mg total) by mouth daily with breakfast. 5 tablet 0  . tiotropium (SPIRIVA HANDIHALER) 18 MCG inhalation capsule Place 1 capsule (18 mcg total) into inhaler and inhale daily. 30 capsule 12   No current facility-administered medications for this visit.    C/C: Patient presents for periodic oral examination and evaluation of upper and lower complete dentures.   HPI:   Patient had  upper and  lower complete dentures inserted on 02/27/2011. Patient had reline procedures done on November 30, 2012. The patient was last seen in July 2015 for denture recall examination.  The patient now presents for periodic oral examination and evaluation of the upper and lower complete dentures. Patient is not having any problems with her upper or lower complete dentures by report.  DENTAL EXAM: General: Patient is a well-developed, well-nourished female in no acute distress. Tracheostomy is present. Vitals: Blood pressure 137/71, pulse 79, temperature 98.2 F (36.8 C), temperature source Oral.  Extraoral Exam:  There is no palpable lymphadenopathy. There are no TMJ Symptoms. The tracheal stoma is still in place.  Intraoral  Exam: Patient has xerostomia. There are no soft tissue lesions or denture irritation noted. Generalized erythema is noted secondary to dryness. Patient denies any recent smoking. There is atrophy of the edentulous alveolar ridges. Patient has thin, spiny ridges of the mandibular arch. Dentition: Patient is edentulous. Prosthodontic: The patient has upper and lower complete dentures. Dentures are stable and retentive.  Pressure indicating paste was applied and dentures were adjusted as needed.  Thick PIP was applied to the lower dentures and borders were adjusted as needed. Dentures were polished.  Occlusion:  The denture occlusion was evaluated. The patient has acceptable occlusion. The patient has a tendency to protrude to an End to end position. Patient was instructed on finding the maximum intercuspation position that avoids the anterior teeth contact. Patient expresses understanding.  Assessments: 1. Patient is edentulous. 2. Atrophy of the edentulous alveolar ridges 3. Upper and lower complete dentures are acceptable.   Plan:  1. Patient to followup with dental medicine as scheduled for yearly recall .  2. The patient to keep dentures out if sore spots arise. Use  salt water rinses as needed aid healing. Call if problems arise before next scheduled appointment.   Lenn Cal, DDS

## 2015-01-17 ENCOUNTER — Other Ambulatory Visit: Payer: Self-pay | Admitting: Obstetrics and Gynecology

## 2015-03-06 ENCOUNTER — Other Ambulatory Visit: Payer: Self-pay | Admitting: Obstetrics and Gynecology

## 2015-03-12 ENCOUNTER — Other Ambulatory Visit: Payer: Self-pay | Admitting: *Deleted

## 2015-03-12 DIAGNOSIS — E039 Hypothyroidism, unspecified: Secondary | ICD-10-CM

## 2015-03-12 MED ORDER — LEVOTHYROXINE SODIUM 175 MCG PO TABS: 175.0000 ug | ORAL_TABLET | Freq: Every day | ORAL | Status: DC

## 2015-03-21 ENCOUNTER — Ambulatory Visit (INDEPENDENT_AMBULATORY_CARE_PROVIDER_SITE_OTHER): Payer: Medicaid Other | Admitting: *Deleted

## 2015-03-21 VITALS — Temp 97.9°F

## 2015-03-21 DIAGNOSIS — Z23 Encounter for immunization: Secondary | ICD-10-CM

## 2015-03-21 NOTE — Progress Notes (Signed)
Pt in clinic today for Flu shot.  Afebrile and no history of reaction to flu shot.  Given in right deltoid and pt tolerated well. Deborah [name], Salome Spotted

## 2015-03-24 ENCOUNTER — Other Ambulatory Visit: Payer: Self-pay | Admitting: Obstetrics and Gynecology

## 2015-05-04 ENCOUNTER — Other Ambulatory Visit: Payer: Self-pay | Admitting: Obstetrics and Gynecology

## 2015-05-07 ENCOUNTER — Other Ambulatory Visit: Payer: Self-pay | Admitting: *Deleted

## 2015-05-07 DIAGNOSIS — J441 Chronic obstructive pulmonary disease with (acute) exacerbation: Secondary | ICD-10-CM

## 2015-05-07 MED ORDER — METOPROLOL TARTRATE 25 MG PO TABS: ORAL_TABLET | ORAL | Status: DC

## 2015-05-07 MED ORDER — ALBUTEROL SULFATE HFA 108 (90 BASE) MCG/ACT IN AERS: INHALATION_SPRAY | RESPIRATORY_TRACT | Status: DC

## 2015-05-07 MED ORDER — MOMETASONE FURO-FORMOTEROL FUM 200-5 MCG/ACT IN AERO: 2.0000 | INHALATION_SPRAY | Freq: Two times a day (BID) | RESPIRATORY_TRACT | Status: DC

## 2015-05-29 ENCOUNTER — Ambulatory Visit: Payer: Self-pay | Admitting: Family Medicine

## 2015-05-31 ENCOUNTER — Encounter: Payer: Self-pay | Admitting: Student

## 2015-05-31 ENCOUNTER — Ambulatory Visit (INDEPENDENT_AMBULATORY_CARE_PROVIDER_SITE_OTHER): Payer: Medicaid Other | Admitting: Student

## 2015-05-31 ENCOUNTER — Ambulatory Visit (HOSPITAL_COMMUNITY)
Admission: RE | Admit: 2015-05-31 | Discharge: 2015-05-31 | Disposition: A | Discharge status: Home / Self Care | Payer: Medicaid Other | Source: Ambulatory Visit | Attending: Cardiology | Admitting: Cardiology

## 2015-05-31 VITALS — BP 169/97 | HR 90 | Temp 98.1°F | Wt 211.7 lb

## 2015-05-31 DIAGNOSIS — M79661 Pain in right lower leg: Secondary | ICD-10-CM | POA: Insufficient documentation

## 2015-05-31 DIAGNOSIS — M25561 Pain in right knee: Secondary | ICD-10-CM | POA: Diagnosis not present

## 2015-05-31 DIAGNOSIS — M25562 Pain in left knee: Secondary | ICD-10-CM

## 2015-05-31 DIAGNOSIS — M7989 Other specified soft tissue disorders: Secondary | ICD-10-CM | POA: Diagnosis not present

## 2015-05-31 DIAGNOSIS — I10 Essential (primary) hypertension: Secondary | ICD-10-CM | POA: Insufficient documentation

## 2015-05-31 DIAGNOSIS — M79604 Pain in right leg: Secondary | ICD-10-CM | POA: Diagnosis not present

## 2015-05-31 DIAGNOSIS — M170 Bilateral primary osteoarthritis of knee: Secondary | ICD-10-CM | POA: Insufficient documentation

## 2015-05-31 NOTE — Progress Notes (Signed)
Subjective:    Patient ID: Deborah [name], female    DOB: 1956/01/18, 59 y.o.   MRN: [id number]   CC: bilateral knee pain/locking  HPI 59 y/o presenting for bilateraly knee pain for last month and increased knee locking  Knee pain/locking - Pt has long standing chronic knee pain for which she has been taking NSAIDs - she had a steroid shot in her left knee in 12/2014 which significantly helped her pain until one month ago - she has pain with movement of the knee but no weakness or decreased range of motion - she did fall 4 days ago on her knees but has not had worsened pain after the fall  Other wise denies fevers, N/V/D  Review of Systems   See HPI for ROS.   Past Medical History  Diagnosis Date  . Hypertension   . 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[place])   . History of laryngectomy   . Bronchitis   . Larynx cancer ([place]) 07/31/2010    supraglotttic s/p chemo/radiation and surgical rescection.  [name] Heart murmur     asymptomatic   . Leukocytopenia   . Asthma   . Sinusitis, chronic 07/20/2011  . Normal MRI 07/14/11    negative for mestasis   . Hx of radiation therapy 09/03/10 to 10/16/2010    supraglottic larynx  . 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 Neck pain 01/21/2012  . GERD (gastroesophageal reflux disease)   . Sepsis ([place]) 08/04/12  . Seizures ([place])     07/24/11 off Effexor w/o seizure  . Anemia   . Sinusitis, chronic 07/20/2011    Bilateral maxillary, identified on MRI of head 07/14/11.    . Diverticulosis 11/15/2012     noted on screening colonoscopy   . Internal hemorrhoid 11/15/2012     small, noted on screening colonoscopy   . Nausea alone 07/28/2013  . COPD (chronic obstructive pulmonary disease) ([place])   . Esophageal stricture    Past Surgical History  Procedure Laterality Date  . Portacath placement  09/17/10    Tip in cavoatrial junction  . Laryngectomy    . Tracheal dilitation  07/16/2011    Procedure: TRACHEAL DILITATION;  Surgeon: Beckie Salts, MD;  Location: [place];   Service: ENT;  Laterality: N/A;  dilation of tracheal stoma and replacement of stoma tube  . Foreign body removal bronchial  10/02/2011    Procedure: REMOVAL FOREIGN BODY BRONCHIAL;  Surgeon: Ruby Cola, MD;  Location: [facility];  Service: ENT;  Laterality: N/A;  . Esophagoscopy  06/21/2012    Procedure: ESOPHAGOSCOPY;  Surgeon: [name], MD;  Location: [facility];  Service: ENT;  Laterality: N/A;  . Tubal ligation  1982  . Colonoscopy N/A 11/15/2012    Procedure: COLONOSCOPY;  Surgeon: Lafayette Dragon, MD;  Location: [facility];  Service: Endoscopy;  Laterality: N/A;  . Breast surgery    . Esophagoscopy with dilitation N/A 09/21/2014    Procedure: ESOPHAGOSCOPY WITH DILITATION;  Surgeon: [name], MD;  Location: [facility];  Service: ENT;  Laterality: N/A;   OB History    No data available     Social History   Social History  . Marital Status: Divorced    Spouse Name: N/A  . Number of Children: N/A  . Years of Education: 12   Occupational History  . Not on file.   Social History Main Topics  . Smoking status: Former Smoker -- 2.00 packs/day for 20 years  Types: Cigarettes    Quit date: 09/17/2010  . Smokeless tobacco: Never Used  . Alcohol Use: No     Comment: Former drinker  . Drug Use: Yes    Special: Cocaine     Comment: tried cocaine 1 time 2010 only used 1 time  . Sexual Activity: Yes    Birth Control/ Protection: Surgical   Other Topics Concern  . Not on file   Social History Narrative   Unemployed. Receives SSI. Lives with one of her 2 adult daughters Deborah [name]) and her sister Deborah [name])     Objective:  BP 169/97 mmHg  Pulse 90  Temp(Src) 98.1 F (36.7 C) (Oral)  Wt 211 lb 11.2 oz (96.026 kg) Vitals and nursing note reviewed  General: NAD Cardiac: RRR,  Respiratory: CTAB, normal effort Abdomen: obese, soft, nontender,  Extremities: bilateral knee tenderness to palpation, left knee mildly edema but no erythema, normal  passive/active ROM. + right calf tenderness to palpation, + homan's sign, no LE edema MSK: 5/5 strength bilateral LE Skin: warm and dry, no rashes noted Neuro: alert and oriented, no focal deficits   Assessment & Plan:    Tenderness of right calf Pt uncertain how long she has had right calf tenderness as she did not note it until her exam. She denies ever having a blood clot, denies prolonged periods of sitting. She does significantly have a history of throat cancer - Will obtain LE dopplers to rule out DVT  Bilateral knee pain Chronic pain with continued worsening after steroid injection in 12/2014 wore off - consider repeat steroid injection - PT/OT - continue to recommend exercise to help with pain     Deborah [name] A. Lincoln Brigham MD, [facility] Resident PGY-2 Pager [phone]

## 2015-05-31 NOTE — Assessment & Plan Note (Signed)
Chronic pain with continued worsening after steroid injection in 12/2014 wore off - consider repeat steroid injection - PT/OT - continue to recommend exercise to help with pain

## 2015-05-31 NOTE — Patient Instructions (Addendum)
Follow up with Dr Lincoln Brigham at 11 am 12/16 for knee pain Please obtain Ultrasound study to make sure you do not have a clot in your right leg If you have questions or concerns, call the office at [phone]

## 2015-05-31 NOTE — Assessment & Plan Note (Addendum)
Pt uncertain how long she has had right calf tenderness as she did not note it until her exam. She denies ever having a blood clot, denies prolonged periods of sitting. She does significantly have a history of throat cancer - Will obtain LE dopplers to rule out DVT

## 2015-06-01 ENCOUNTER — Encounter: Payer: Self-pay | Admitting: Student

## 2015-06-01 ENCOUNTER — Ambulatory Visit (INDEPENDENT_AMBULATORY_CARE_PROVIDER_SITE_OTHER): Payer: Medicaid Other | Admitting: Student

## 2015-06-01 VITALS — BP 159/87 | HR 105 | Temp 98.2°F | Wt 209.6 lb

## 2015-06-01 DIAGNOSIS — J441 Chronic obstructive pulmonary disease with (acute) exacerbation: Secondary | ICD-10-CM | POA: Diagnosis present

## 2015-06-01 DIAGNOSIS — M25561 Pain in right knee: Secondary | ICD-10-CM

## 2015-06-01 DIAGNOSIS — M25562 Pain in left knee: Secondary | ICD-10-CM | POA: Diagnosis not present

## 2015-06-01 NOTE — Progress Notes (Signed)
Subjective:    Patient ID: Deborah [name], female    DOB: [birth year], 59 y.o.   MRN: [id number]   CC; bilateral knee pain  HPI 59 y/o presenting for bilateral knee pain with complaint yesterday 12/15 of right calf pain  Right calf pain - underwent RLE doppler study which was negative for DVT - Pt feels pain has improved  Knee pain - Knee pain still 10/10 bilaterally - no decrease in function or range of motion - no weakness - currently using NSAID pain relief - desires steroid shots in her knees  Review of Systems   See HPI for ROS.   Past Medical History  Diagnosis Date  . Hypertension   . 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[place])   . History of laryngectomy   . Bronchitis   . Larynx cancer ([place]) 07/31/2010    supraglotttic s/p chemo/radiation and surgical rescection.  [name] Heart murmur     asymptomatic   . Leukocytopenia   . Asthma   . Sinusitis, chronic 07/20/2011  . Normal MRI 07/14/11    negative for mestasis   . Hx of radiation therapy 09/03/10 to 10/16/2010    supraglottic larynx  . 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 Neck pain 01/21/2012  . GERD (gastroesophageal reflux disease)   . Sepsis ([place]) 08/04/12  . Seizures ([place])     07/24/11 off Effexor w/o seizure  . Anemia   . Sinusitis, chronic 07/20/2011    Bilateral maxillary, identified on MRI of head 07/14/11.    . Diverticulosis 11/15/2012     noted on screening colonoscopy   . Internal hemorrhoid 11/15/2012     small, noted on screening colonoscopy   . Nausea alone 07/28/2013  . COPD (chronic obstructive pulmonary disease) ([place])   . Esophageal stricture    Past Surgical History  Procedure Laterality Date  . Portacath placement  09/17/10    Tip in cavoatrial junction  . Laryngectomy    . Tracheal dilitation  07/16/2011    Procedure: TRACHEAL DILITATION;  Surgeon: Beckie Salts, MD;  Location: [facility];  Service: ENT;  Laterality: N/A;  dilation of tracheal stoma and replacement of stoma tube  . Foreign body removal bronchial   10/02/2011    Procedure: REMOVAL FOREIGN BODY BRONCHIAL;  Surgeon: Ruby Cola, MD;  Location: [place];  Service: ENT;  Laterality: N/A;  . Esophagoscopy  06/21/2012    Procedure: ESOPHAGOSCOPY;  Surgeon: [name], MD;  Location: [place] [facility];  Service: ENT;  Laterality: N/A;  . Tubal ligation  1982  . Colonoscopy N/A 11/15/2012    Procedure: COLONOSCOPY;  Surgeon: Lafayette Dragon, MD;  [facility];  Service: Endoscopy;  Laterality: N/A;  . Breast surgery    . Esophagoscopy with dilitation N/A 09/21/2014    Procedure: ESOPHAGOSCOPY WITH DILITATION;  Surgeon: [name], MD;  Location: [place];  Service: ENT;  Laterality: N/A;   OB History    No data available     Social History   Social History  . Marital Status: Divorced    Spouse Name: N/A  . Number of Children: N/A  . Years of Education: 12   Occupational History  . Not on file.   Social History Main Topics  . Smoking status: Former Smoker -- 2.00 packs/day for 20 years    Types: Cigarettes    Quit date: 09/17/2010  . Smokeless tobacco: Never Used  . Alcohol Use: No     Comment: Former drinker  .  Drug Use: Yes    Special: Cocaine     Comment: tried cocaine 1 time 2010 only used 1 time  . Sexual Activity: Yes    Birth Control/ Protection: Surgical   Other Topics Concern  . Not on file   Social History Narrative   Unemployed. Receives SSI. Lives with one of her 2 adult daughters Deborah [name]) and her sister Deborah [name])     Objective:  BP 159/87 mmHg  Pulse 105  Temp(Src) 98.2 F (36.8 C) (Oral)  Wt 209 lb 9.6 oz (95.074 kg) Vitals and nursing note reviewed  General: NAD Cardiac: RRR,  Respiratory: CTAB, normal effort Extremities: bilateral knee tenderness, normal ROM, no LE edema, improved right calf tenderness Neuro: alert and oriented, no focal deficits   Assessment & Plan:    Bilateral knee pain Had steroid injections today for knee pain in both of her knees Will refer  to PT for knee pain Discussed importance of complying wit the prescribed exercises given by PT     Deborah [name] A. Deborah Brigham MD, [facility] Resident PGY-2 Pager [phone]

## 2015-06-01 NOTE — Progress Notes (Signed)
Patient ID: Deborah [name], female   DOB: September 06, 1955, 59 y.o.   MRN: [id number]   Steroid Injection Procedure Note   INJECTION: Patient was given informed consent, signed copy in the chart. Appropriate time out was taken. Area prepped and draped in usual sterile fashion. 3 cc of methylprednisolone 40 mg/ml plus  1 cc of 1% lidocaine without epinephrine was injected into the lateral aspect of the right knee. This was then repeated on the left knee but a medial approach was taken. The patient tolerated the procedure well. There were no complications. Post procedure instructions were given.   Mayetta Castleman A. Lincoln Brigham MD, [facility] Resident PGY-2 Pager [phone]

## 2015-06-01 NOTE — Patient Instructions (Addendum)
Follow up in 6 months If you have joint swelling, fevers, or bleeding, go to the ED right away Call the office with questions or concerns

## 2015-06-01 NOTE — Assessment & Plan Note (Addendum)
Had steroid injections today for knee pain in both of her knees Will refer to PT for knee pain Discussed importance of complying wit the prescribed exercises given by PT

## 2015-07-09 ENCOUNTER — Other Ambulatory Visit: Payer: Self-pay | Admitting: Hematology and Oncology

## 2015-07-09 ENCOUNTER — Other Ambulatory Visit: Payer: Self-pay | Admitting: Obstetrics and Gynecology

## 2015-07-18 ENCOUNTER — Telehealth: Payer: Self-pay | Admitting: *Deleted

## 2015-07-18 NOTE — Telephone Encounter (Signed)
Prior Authorization received from [facility] pharmacy for Losartan 100 mg. Formulary and PA form placed in provider box for completion. Thoma Paulsen L, RN  

## 2015-07-19 NOTE — Telephone Encounter (Signed)
Form completed and returned to Tamika. 

## 2015-07-20 NOTE — Telephone Encounter (Signed)
Received PA approval for losartan 100 mg via [employer].  Med approved for 07/20/15 - 07/14/16.  [facility] pharmacy informed.  Patient and tried and failed lisinopril 10 mg or 20 mg due to a cough.  PA approval number [id number]. Interaction ID: [id number]. Derl Barrow, RN

## 2015-07-23 ENCOUNTER — Ambulatory Visit (INDEPENDENT_AMBULATORY_CARE_PROVIDER_SITE_OTHER): Payer: Medicaid Other | Admitting: Family Medicine

## 2015-07-23 VITALS — BP 156/86 | HR 96 | Temp 98.3°F | Wt 208.3 lb

## 2015-07-23 DIAGNOSIS — J209 Acute bronchitis, unspecified: Secondary | ICD-10-CM

## 2015-07-23 DIAGNOSIS — J441 Chronic obstructive pulmonary disease with (acute) exacerbation: Secondary | ICD-10-CM | POA: Diagnosis present

## 2015-07-23 MED ORDER — DOXYCYCLINE HYCLATE 100 MG PO TABS: 100.0000 mg | ORAL_TABLET | Freq: Two times a day (BID) | ORAL | Status: AC

## 2015-07-23 NOTE — Progress Notes (Signed)
Patient ID: Deborah [name], female   DOB: [birth year], 60 y.o.   MRN: [id number]   Subjective:  This history was provided by the patient.  Deborah [name] is a 60 y.o. 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   Patient presents to clinic for cough x10 days.  Patient states cough is intermittently productive of yellow sputum and this morning she "coughed up blood clots" once.  Patient has a trach in place and states she has been unable to tolerate wearing valve because it feels as though "the spit gets stuck."  Patient removed the valve yesterday and has not been able to get it back in without forcing it.  Patient states that has happened in the past.   Patient states she is always short of breath and wheezes because of her COPD, but those have gotten worse with onset of cough and URI symptoms.  Patient endorses nasal congestion, post nasal drip, sinus pain/pressure.  She denies fever, N/V/D, dizziness, abdominal pain and chest pain.  Patient has treated current illness with OTC Mucinex and states she is using her Albuterol and Spiriva inhalers as prescribed.    Review of Systems:  Per HPI. All other systems reviewed and are negative.   PMH, PSH, Medications, Allergies, and FmHx reviewed and updated in EMR.  Social History: Former smoker  Objective:  BP 156/86 mmHg  Pulse 96  Temp(Src) 98.3 F (36.8 C) (Oral)  Wt 208 lb 4.8 oz  (94.484 kg)  General: 60 y.o. female Awake, alert, well-nourished, no apparent distress and non-toxic in appearance. Patient was cooperative and interactive, but anxious to leave to pick up her granddaughter. HEENT: Normal    Neck: Tracheostomy in place.  No soft tissue edema, drainage or erythema around opening.  No masses palpated.  No LAD.    Ears: TMs intact, normal light reflex, no erythema, no bulging, no otorrhea.    Eyes: Sclera white with no injection.  No drainage noted.    Nose: nasal turbinates moist    Throat: MMM, no erythema to orpharynx Cardio: RRR, S1S2 heard, no murmurs appreciated, no lower extremity edema.  No cyanosis or clubbing; +2 radial pulses bilaterally.  No pallor noted to buccal mucosa or lower palpebral conjunctiva. Pulm: Occasional rhonchi heard throughout with crackles LLL.  No wheezing or increased WOB noted.  Patient had 2 clots on a cloth she had been using to cough into in exam room that appeared to have been soft tissue/blood from her trach.  Patient ambulated to and from exam room without displaying signs of dyspnea. GI: Soft, not tender, no distension,+BS x4 Neuro: Strength and sensation grossly intact  Assessment & Plan:     Deborah [name] is a 60 y.o. female here for cough.  Given's patient's exam and history, this is most likely a COPD exacerbation.  Doubt pneumonia given patient's lack of fever and dyspnea.  Patient's hemoptysis is concerning, but she agitated trach opening trying to force valve back in place yesterday and has been coughing vigorously for several days.  She displays or reports no signs of anemia or shock - her BP  is mildly hypertension and her HR is WNL.    1.  COPD Exacerbation - Doxycycline 100 mg tablets - Continue use of inhalers and Claritin as previously prescribed - Return to clinic or seek emergency care if hemoptysis becomes more frequent or dyspnea worsens.     Sherron Ales, NP Student [facility] 07/23/2015  5:53 PM  RESIDENT ADDENDUM I have separately seen and examined the patient. I have discussed the findings and exam with the NP student and agree with the above note. Additionally I have outlined my exam and assessment/plan below:  PE: General: no apparent distress  HEENT: Patent stoma CV: normal rate, regular rhythm,  Resp: left sided rhonchi and faint crackles at the base, good air movement bilaterally, normal effort  A/P: Suspected bronchitis - overall well appearing, however fair amount of thick discharge with cough from stoma along with blood. Will treat with doxycycline, follow up if not improving after antibiotics. Given strict return precautions for hemoptysis (which she says has happened before), patient declined CXR.  Tawanna Sat, MD 07/24/2015, 10:56 PM PGY-3, [place]

## 2015-08-03 ENCOUNTER — Encounter: Payer: Self-pay | Admitting: *Deleted

## 2015-08-03 ENCOUNTER — Other Ambulatory Visit: Payer: Self-pay

## 2015-08-03 ENCOUNTER — Encounter: Payer: Self-pay | Admitting: Hematology and Oncology

## 2015-08-03 ENCOUNTER — Ambulatory Visit: Payer: Self-pay | Admitting: Hematology and Oncology

## 2015-08-03 ENCOUNTER — Other Ambulatory Visit: Payer: Self-pay | Admitting: Hematology and Oncology

## 2015-08-03 DIAGNOSIS — E039 Hypothyroidism, unspecified: Secondary | ICD-10-CM

## 2015-08-03 DIAGNOSIS — Z8589 Personal history of malignant neoplasm of other organs and systems: Secondary | ICD-10-CM

## 2015-08-03 NOTE — Progress Notes (Unsigned)
Failure to show letter mailed to patient's home, per dr Alvy Bimler

## 2015-08-10 ENCOUNTER — Telehealth: Payer: Self-pay | Admitting: Oncology

## 2015-08-10 NOTE — Telephone Encounter (Signed)
pt came by stating she forget appt-sent DrGorsuch email to advise pt was no shpw-adv pt I will await reply from Dr Alvy Bimler and call her with r/s appt

## 2015-08-13 ENCOUNTER — Other Ambulatory Visit: Payer: Self-pay | Admitting: Hematology and Oncology

## 2015-08-14 ENCOUNTER — Telehealth: Payer: Self-pay | Admitting: Hematology and Oncology

## 2015-08-14 NOTE — Telephone Encounter (Signed)
per pof to sch pt appt-cld pt and gave appt time & date °

## 2015-08-17 ENCOUNTER — Other Ambulatory Visit: Payer: Self-pay | Admitting: Obstetrics and Gynecology

## 2015-08-20 ENCOUNTER — Encounter: Payer: Self-pay | Admitting: Hematology and Oncology

## 2015-08-20 ENCOUNTER — Other Ambulatory Visit: Payer: Self-pay | Admitting: Hematology and Oncology

## 2015-08-20 ENCOUNTER — Ambulatory Visit (HOSPITAL_BASED_OUTPATIENT_CLINIC_OR_DEPARTMENT_OTHER): Payer: Medicaid Other | Admitting: Hematology and Oncology

## 2015-08-20 ENCOUNTER — Other Ambulatory Visit (HOSPITAL_BASED_OUTPATIENT_CLINIC_OR_DEPARTMENT_OTHER): Payer: Medicaid Other

## 2015-08-20 ENCOUNTER — Telehealth: Payer: Self-pay | Admitting: *Deleted

## 2015-08-20 VITALS — BP 111/62 | HR 85 | Temp 97.7°F | Resp 20 | Wt 205.1 lb

## 2015-08-20 DIAGNOSIS — E039 Hypothyroidism, unspecified: Secondary | ICD-10-CM

## 2015-08-20 DIAGNOSIS — Z8589 Personal history of malignant neoplasm of other organs and systems: Secondary | ICD-10-CM

## 2015-08-20 LAB — COMPREHENSIVE METABOLIC PANEL
ALBUMIN: 3.9 g/dL (ref 3.5–5.0)
ALK PHOS: 90 U/L (ref 40–150)
ALT: 12 U/L (ref 0–55)
ANION GAP: 10 meq/L (ref 3–11)
AST: 18 U/L (ref 5–34)
BILIRUBIN TOTAL: 0.34 mg/dL (ref 0.20–1.20)
BUN: 21.3 mg/dL (ref 7.0–26.0)
CO2: 25 meq/L (ref 22–29)
CREATININE: 1.1 mg/dL (ref 0.6–1.1)
Calcium: 9.9 mg/dL (ref 8.4–10.4)
Chloride: 108 mEq/L (ref 98–109)
EGFR: 63 mL/min/{1.73_m2} — AB (ref >90)
GLUCOSE: 86 mg/dL (ref 70–140)
Potassium: 4.1 mEq/L (ref 3.5–5.1)
Sodium: 143 mEq/L (ref 136–145)
TOTAL PROTEIN: 8.8 g/dL — AB (ref 6.4–8.3)

## 2015-08-20 LAB — CBC WITH DIFFERENTIAL/PLATELET
BASO%: 1.2 % (ref 0.0–2.0)
BASOS ABS: 0.1 10*3/uL (ref 0.0–0.1)
EOS%: 4.7 % (ref 0.0–7.0)
Eosinophils Absolute: 0.2 10*3/uL (ref 0.0–0.5)
HCT: 37.3 % (ref 34.8–46.6)
HGB: 11.9 g/dL (ref 11.6–15.9)
LYMPH#: 1.2 10*3/uL (ref 0.9–3.3)
LYMPH%: 24.6 % (ref 14.0–49.7)
MCH: 29.4 pg (ref 25.1–34.0)
MCHC: 32 g/dL (ref 31.5–36.0)
MCV: 91.8 fL (ref 79.5–101.0)
MONO#: 0.3 10*3/uL (ref 0.1–0.9)
MONO%: 6.8 % (ref 0.0–14.0)
NEUT#: 3.1 10*3/uL (ref 1.5–6.5)
NEUT%: 62.7 % (ref 38.4–76.8)
Platelets: 305 10*3/uL (ref 145–400)
RBC: 4.07 10*6/uL (ref 3.70–5.45)
RDW: 14.4 % (ref 11.2–14.5)
WBC: 5 10*3/uL (ref 3.9–10.3)

## 2015-08-20 LAB — TSH: TSH: 2.147 m[IU]/L (ref 0.308–3.960)

## 2015-08-20 NOTE — Telephone Encounter (Signed)
Notified of message below

## 2015-08-20 NOTE — Telephone Encounter (Signed)
-----   Message from Heath Lark, MD sent at 08/20/2015  1:33 PM EST ----- Regarding: TSH PLs let her know thyroid test was OK ----- Message -----    From: [facility] in [facility] Interface    Sent: 08/20/2015  12:12 PM      To: Heath Lark, MD

## 2015-08-21 LAB — T4, FREE: FREE T4: 1.86 ng/dL — AB (ref 0.82–1.77)

## 2015-08-21 NOTE — Progress Notes (Signed)
[facility] OFFICE PROGRESS NOTE  Patient Care Team: Katheren Shams, DO as PCP - General [name], MD as Attending Physician (Otolaryngology) Kyung Rudd, MD (Radiation Oncology) Heath Lark, MD as Consulting Physician (Hematology and Oncology)  SUMMARY OF ONCOLOGIC HISTORY:  This patient was diagnosed with laryngeal cancer in the past after self palpation of a lump. She subsequently underwent total laryngectomy and bilateral neck dissection on 07/31/2010. The patient have high risk features including extracapsular extension, perineural invasion and lymphovascular invasion. She received adjuvant chemotherapy with radiation therapy between 09/03/2010 to 10/01/2010. The patient could not tolerate chemotherapy well and it ended prematurely. She was receiving weekly cisplatin. Radiation therapy was completed by 10/16/2010 and she was placed on observation.  INTERVAL HISTORY: Deborah [name] returns for further followup. She does not use the electronic device that was prescribed for her. She self manage her tracheostomy care. She was seen at [facility] recently and continues periodic follow-up there for tracheostomy care She has significant stiff neck and pain in her neck. She has severe neck fibrosis and have difficulties moving her neck. Previously she was seen by physical therapist but because of insurance issue she was not able to continue outpatient rehabilitation. In 2016, she was noted to have poorly controlled hypothyroidism. Since then, she has been compliant taking her thyroid replacement therapy. She denies swallowing difficulties.  REVIEW OF SYSTEMS:   Constitutional: Denies fevers, chills or abnormal weight loss Eyes: Denies blurriness of vision Ears, nose, mouth, throat, and face: Denies mucositis or sore throat Respiratory: Denies cough, dyspnea or wheezes Cardiovascular: Denies palpitation, chest discomfort or lower extremity swelling Gastrointestinal:   Denies nausea, heartburn or change in bowel habits Skin: Denies abnormal skin rashes Lymphatics: Denies new lymphadenopathy or easy bruising Neurological:Denies numbness, tingling or new weaknesses Behavioral/Psych: Mood is stable, no new changes  All other systems were reviewed with the patient and are negative.  I have reviewed the past medical history, past surgical history, social history and family history with the patient and they are unchanged from previous note.  ALLERGIES:  is allergic to lisinopril.  MEDICATIONS:  Current Outpatient Prescriptions  Medication Sig Dispense Refill  . albuterol (PROVENTIL HFA) 108 (90 BASE) MCG/ACT inhaler INHALE 2 PUFFS INTO THE LUNGS EVERY 4 HOURS AS NEEDED FOR WHEEZING OR SHORTNESS OF BREATH. 2 each 2  . buPROPion (WELLBUTRIN XL) 150 MG 24 hr tablet Take 2 tablets (300 mg total) by mouth daily. 60 tablet 4  . cyclobenzaprine (FLEXERIL) 10 MG tablet TAKE ONE TABLET BY MOUTH TWICE DAILY AS NEEDED FOR MUSCLE SPASM 60 tablet 2  . dextromethorphan-guaiFENesin (MUCINEX DM) 30-600 MG per 12 hr tablet Take 1 tablet by mouth 2 (two) times daily as needed for cough. 30 tablet 2  . EQ LORATADINE 10 MG tablet TAKE ONE TABLET BY MOUTH ONCE DAILY 30 tablet 6  . ibuprofen (ADVIL,MOTRIN) 600 MG tablet TAKE ONE TABLET BY MOUTH EVERY 8 HOURS AS NEEDED FOR  MODERATE  PAIN 30 tablet 0  . levothyroxine (SYNTHROID, LEVOTHROID) 175 MCG tablet Take 1 tablet (175 mcg total) by mouth daily before breakfast. 90 tablet 0  . losartan (COZAAR) 100 MG tablet TAKE ONE TABLET BY MOUTH  DAILY 30 tablet 5  . MELATONIN PO Take 1 tablet by mouth at bedtime as needed (sleep).    . metoprolol tartrate (LOPRESSOR) 25 MG tablet TAKE ONE TABLET BY MOUTH TWICE DAILY 60 tablet 3  . mometasone-formoterol (DULERA) 200-5 MCG/ACT AERO Inhale 2 puffs into  the lungs 2 (two) times daily. 1 Inhaler 6  . omeprazole (PRILOSEC) 40 MG capsule TAKE ONE CAPSULE BY MOUTH IN THE MORNING. TAKE AT LEAST FOUR  HOURS AFTER TAKING LEVOTHYROXINE. 30 capsule 3  . ondansetron (ZOFRAN-ODT) 4 MG disintegrating tablet DISSOLVE ONE TABLET IN MOUTH EVERY 8 HOURS AS NEEDED FOR NAUSEA 60 tablet 0  . predniSONE (DELTASONE) 20 MG tablet Take 2 tablets (40 mg total) by mouth daily with breakfast. 5 tablet 0  . tiotropium (SPIRIVA HANDIHALER) 18 MCG inhalation capsule Place 1 capsule (18 mcg total) into inhaler and inhale daily. 30 capsule 12   No current facility-administered medications for this visit.    PHYSICAL EXAMINATION: ECOG PERFORMANCE STATUS: 1 - Symptomatic but completely ambulatory  Filed Vitals:   08/20/15 1211  BP: 111/62  Pulse: 85  Temp: 97.7 F (36.5 C)  Resp: 20   Filed Weights   08/20/15 1211  Weight: 205 lb 1.6 oz (93.033 kg)    GENERAL:alert, no distress and comfortable. She is obese SKIN: skin color, texture, turgor are normal, no rashes or significant lesions EYES: normal, Conjunctiva are pink and non-injected, sclera clear OROPHARYNX:no exudate, no erythema and lips, buccal mucosa, and tongue normal  NECK: She had tracheostomy in situ. Her neck is woody from prior radiation and surgery LYMPH:  no palpable lymphadenopathy in the cervical, axillary or inguinal LUNGS: clear to auscultation and percussion with normal breathing effort HEART: regular rate & rhythm and no murmurs and no lower extremity edema ABDOMEN:abdomen soft, non-tender and normal bowel sounds Musculoskeletal:no cyanosis of digits and no clubbing  NEURO: alert & oriented x 3 with fluent speech, no focal motor/sensory deficits  LABORATORY DATA:  I have reviewed the data as listed    Component Value Date/Time   NA 143 08/20/2015 1200   NA 138 11/03/2014 1536   NA 143 01/20/2012 0939   K 4.1 08/20/2015 1200   K 4.1 11/03/2014 1536   K 4.3 01/20/2012 0939   CL 104 11/03/2014 1536   CL 105 11/23/2012 0807   CL 100 01/20/2012 0939   CO2 25 08/20/2015 1200   CO2 25 11/03/2014 1536   CO2 27 01/20/2012 0939    GLUCOSE 86 08/20/2015 1200   GLUCOSE 79 11/03/2014 1536   GLUCOSE 83 11/23/2012 0807   GLUCOSE 97 01/20/2012 0939   BUN 21.3 08/20/2015 1200   BUN 16 11/03/2014 1536   BUN 18 01/20/2012 0939   CREATININE 1.1 08/20/2015 1200   CREATININE 1.04 11/03/2014 1536   CREATININE 1.20* 09/21/2014 0639   CALCIUM 9.9 08/20/2015 1200   CALCIUM 9.4 11/03/2014 1536   CALCIUM 9.6 01/20/2012 0939   PROT 8.8* 08/20/2015 1200   PROT 8.5* 11/03/2014 1536   PROT 8.7* 01/20/2012 0939   ALBUMIN 3.9 08/20/2015 1200   ALBUMIN 4.4 11/03/2014 1536   ALBUMIN 3.3 01/20/2012 0939   AST 18 08/20/2015 1200   AST 16 11/03/2014 1536   AST 20 01/20/2012 0939   ALT 12 08/20/2015 1200   ALT 12 11/03/2014 1536   ALT 16 01/20/2012 0939   ALKPHOS 90 08/20/2015 1200   ALKPHOS 87 11/03/2014 1536   ALKPHOS 84 01/20/2012 0939   BILITOT 0.34 08/20/2015 1200   BILITOT 0.3 11/03/2014 1536   BILITOT 0.70 01/20/2012 0939   GFRNONAA 50* 09/21/2014 0639   GFRAA 57* 09/21/2014 0639    No results found for: SPEP, UPEP  Lab Results  Component Value Date   WBC 5.0 08/20/2015   NEUTROABS 3.1 08/20/2015  HGB 11.9 08/20/2015   HCT 37.3 08/20/2015   MCV 91.8 08/20/2015   PLT 305 08/20/2015      Chemistry      Component Value Date/Time   NA 143 08/20/2015 1200   NA 138 11/03/2014 1536   NA 143 01/20/2012 0939   K 4.1 08/20/2015 1200   K 4.1 11/03/2014 1536   K 4.3 01/20/2012 0939   CL 104 11/03/2014 1536   CL 105 11/23/2012 0807   CL 100 01/20/2012 0939   CO2 25 08/20/2015 1200   CO2 25 11/03/2014 1536   CO2 27 01/20/2012 0939   BUN 21.3 08/20/2015 1200   BUN 16 11/03/2014 1536   BUN 18 01/20/2012 0939   CREATININE 1.1 08/20/2015 1200   CREATININE 1.04 11/03/2014 1536   CREATININE 1.20* 09/21/2014 0639      Component Value Date/Time   CALCIUM 9.9 08/20/2015 1200   CALCIUM 9.4 11/03/2014 1536   CALCIUM 9.6 01/20/2012 0939   ALKPHOS 90 08/20/2015 1200   ALKPHOS 87 11/03/2014 1536   ALKPHOS 84  01/20/2012 0939   AST 18 08/20/2015 1200   AST 16 11/03/2014 1536   AST 20 01/20/2012 0939   ALT 12 08/20/2015 1200   ALT 12 11/03/2014 1536   ALT 16 01/20/2012 0939   BILITOT 0.34 08/20/2015 1200   BILITOT 0.3 11/03/2014 1536   BILITOT 0.70 01/20/2012 0939     ASSESSMENT & PLAN:  History of head and neck cancer Clinically she has no recurrence of disease. She will continue close ENT follow-up with trach care. The patient has a long term cancer survivor. I will transition her care to cancer survivorship program for future follow-up  Acquired hypothyroidism The patient had a history of poorly controlled hypothyroidism. TSH is now within normal limits. She will continue thyroid replacement therapy and I recommend close follow-up with primary care doctor for monitoring of TSH and medication adjustment as needed   Orders Placed This Encounter  Procedures  . CBC with Differential/Platelet    Standing Status: Future     Number of Occurrences:      Standing Expiration Date: 09/23/2016  . Comprehensive metabolic panel    Standing Status: Future     Number of Occurrences:      Standing Expiration Date: 09/23/2016  . TSH    Standing Status: Future     Number of Occurrences:      Standing Expiration Date: 09/23/2016  . Amb Referral to Survivorship Program    Referral Priority:  Routine    Referral Type:  Consultation    Number of Visits Requested:  1   All questions were answered. The patient knows to call the clinic with any problems, questions or concerns. No barriers to learning was detected. I spent 15 minutes counseling the patient face to face. The total time spent in the appointment was 20 minutes and more than 50% was on counseling and review of test results     [facility], [place], MD 08/21/2015 7:08 AM

## 2015-08-21 NOTE — Assessment & Plan Note (Signed)
The patient had a history of poorly controlled hypothyroidism. TSH is now within normal limits. She will continue thyroid replacement therapy and I recommend close follow-up with primary care doctor for monitoring of TSH and medication adjustment as needed

## 2015-08-21 NOTE — Assessment & Plan Note (Signed)
Clinically she has no recurrence of disease. She will continue close ENT follow-up with trach care. The patient has a long term cancer survivor. I will transition her care to cancer survivorship program for future follow-up

## 2015-09-19 ENCOUNTER — Other Ambulatory Visit: Payer: Self-pay | Admitting: Obstetrics and Gynecology

## 2015-09-19 ENCOUNTER — Other Ambulatory Visit: Payer: Self-pay | Admitting: Hematology and Oncology

## 2015-09-25 ENCOUNTER — Other Ambulatory Visit: Payer: Self-pay | Admitting: Adult Health

## 2015-09-25 ENCOUNTER — Telehealth: Payer: Self-pay | Admitting: Adult Health

## 2015-09-25 DIAGNOSIS — Z8589 Personal history of malignant neoplasm of other organs and systems: Secondary | ICD-10-CM

## 2015-09-25 DIAGNOSIS — E039 Hypothyroidism, unspecified: Secondary | ICD-10-CM

## 2015-09-25 NOTE — Telephone Encounter (Signed)
I attempted to reach Ms. Goh re: her appt to see me in 1 year, as requested by Dr. Alvy Bimler.  I left a voicemail letting her know that I would mail her a copy of her appts, along with my business card and she is welcome to contact me with any questions or concerns before her next appt here.  I look forward to participating in her care.   A letter, appt calendar, and "Life After Cancer for Every Survivor" brochure mailed to the patient's home address.   Mike Craze, NP [place] [phone]

## 2015-11-09 ENCOUNTER — Other Ambulatory Visit: Payer: Self-pay | Admitting: Hematology and Oncology

## 2015-11-09 ENCOUNTER — Other Ambulatory Visit: Payer: Self-pay | Admitting: Obstetrics and Gynecology

## 2015-11-09 DIAGNOSIS — E039 Hypothyroidism, unspecified: Secondary | ICD-10-CM

## 2015-11-09 MED ORDER — LEVOTHYROXINE SODIUM 175 MCG PO TABS: 175.0000 ug | ORAL_TABLET | Freq: Every day | ORAL | Status: DC

## 2015-11-09 MED ORDER — TIOTROPIUM BROMIDE MONOHYDRATE 18 MCG IN CAPS: 18.0000 ug | ORAL_CAPSULE | Freq: Every day | RESPIRATORY_TRACT | Status: DC

## 2015-11-09 MED ORDER — ALBUTEROL SULFATE HFA 108 (90 BASE) MCG/ACT IN AERS: INHALATION_SPRAY | RESPIRATORY_TRACT | Status: DC

## 2015-11-09 MED ORDER — MOMETASONE FURO-FORMOTEROL FUM 200-5 MCG/ACT IN AERO
2.0000 | INHALATION_SPRAY | Freq: Two times a day (BID) | RESPIRATORY_TRACT | Status: DC
Start: 2015-11-09 — End: 2015-11-10

## 2015-11-09 NOTE — Telephone Encounter (Signed)
Patient almost out of medication.  Derl Barrow, RN

## 2015-11-09 NOTE — Telephone Encounter (Signed)
Will the medications be refilled until she can get an appointment?  Derl Barrow, RN

## 2015-11-09 NOTE — Telephone Encounter (Signed)
Patient given limited amount of medications. She has not seen me in about a year. Please advice her to schedule an appointment to follow-up on chronic illnesses. Thanks

## 2015-11-10 ENCOUNTER — Emergency Department (HOSPITAL_COMMUNITY)
Admission: EM | Admit: 2015-11-10 | Discharge: 2015-11-11 | Disposition: A | Discharge status: Home / Self Care | Payer: Medicaid Other | Attending: Emergency Medicine | Admitting: Emergency Medicine

## 2015-11-10 ENCOUNTER — Encounter (HOSPITAL_COMMUNITY): Payer: Self-pay

## 2015-11-10 ENCOUNTER — Emergency Department (HOSPITAL_COMMUNITY): Payer: Medicaid Other

## 2015-11-10 DIAGNOSIS — K219 Gastro-esophageal reflux disease without esophagitis: Secondary | ICD-10-CM | POA: Diagnosis not present

## 2015-11-10 DIAGNOSIS — J441 Chronic obstructive pulmonary disease with (acute) exacerbation: Secondary | ICD-10-CM | POA: Diagnosis not present

## 2015-11-10 DIAGNOSIS — Z8521 Personal history of malignant neoplasm of larynx: Secondary | ICD-10-CM | POA: Diagnosis not present

## 2015-11-10 DIAGNOSIS — Z79899 Other long term (current) drug therapy: Secondary | ICD-10-CM | POA: Insufficient documentation

## 2015-11-10 DIAGNOSIS — I10 Essential (primary) hypertension: Secondary | ICD-10-CM | POA: Diagnosis not present

## 2015-11-10 DIAGNOSIS — Z87891 Personal history of nicotine dependence: Secondary | ICD-10-CM | POA: Insufficient documentation

## 2015-11-10 DIAGNOSIS — E039 Hypothyroidism, unspecified: Secondary | ICD-10-CM | POA: Diagnosis not present

## 2015-11-10 DIAGNOSIS — R000 Tachycardia, unspecified: Secondary | ICD-10-CM | POA: Insufficient documentation

## 2015-11-10 DIAGNOSIS — Z8619 Personal history of other infectious and parasitic diseases: Secondary | ICD-10-CM | POA: Insufficient documentation

## 2015-11-10 DIAGNOSIS — R011 Cardiac murmur, unspecified: Secondary | ICD-10-CM | POA: Diagnosis not present

## 2015-11-10 DIAGNOSIS — R0602 Shortness of breath: Secondary | ICD-10-CM | POA: Diagnosis present

## 2015-11-10 DIAGNOSIS — Z7951 Long term (current) use of inhaled steroids: Secondary | ICD-10-CM | POA: Diagnosis not present

## 2015-11-10 LAB — BASIC METABOLIC PANEL
ANION GAP: 8 (ref 5–15)
BUN: 17 mg/dL (ref 6–20)
CHLORIDE: 106 mmol/L (ref 101–111)
CO2: 24 mmol/L (ref 22–32)
Calcium: 9.6 mg/dL (ref 8.9–10.3)
Creatinine, Ser: 1.03 mg/dL — ABNORMAL HIGH (ref 0.44–1.00)
GFR calc non Af Amer: 59 mL/min — ABNORMAL LOW (ref >60)
Glucose, Bld: 103 mg/dL — ABNORMAL HIGH (ref 65–99)
Potassium: 3.7 mmol/L (ref 3.5–5.1)
Sodium: 138 mmol/L (ref 135–145)

## 2015-11-10 LAB — CBC WITH DIFFERENTIAL/PLATELET
Basophils Absolute: 0.1 10*3/uL (ref 0.0–0.1)
Basophils Relative: 1 %
Eosinophils Absolute: 0.4 10*3/uL (ref 0.0–0.7)
Eosinophils Relative: 6 %
HEMATOCRIT: 37.2 % (ref 36.0–46.0)
HEMOGLOBIN: 11.9 g/dL — AB (ref 12.0–15.0)
LYMPHS ABS: 2.3 10*3/uL (ref 0.7–4.0)
Lymphocytes Relative: 34 %
MCH: 28.4 pg (ref 26.0–34.0)
MCHC: 32 g/dL (ref 30.0–36.0)
MCV: 88.8 fL (ref 78.0–100.0)
MONOS PCT: 6 %
Monocytes Absolute: 0.4 10*3/uL (ref 0.1–1.0)
NEUTROS ABS: 3.6 10*3/uL (ref 1.7–7.7)
NEUTROS PCT: 53 %
Platelets: 336 10*3/uL (ref 150–400)
RBC: 4.19 MIL/uL (ref 3.87–5.11)
RDW: 13.6 % (ref 11.5–15.5)
WBC: 6.8 10*3/uL (ref 4.0–10.5)

## 2015-11-10 MED ORDER — METHYLPREDNISOLONE SODIUM SUCC 125 MG IJ SOLR
125.0000 mg | Freq: Once | INTRAMUSCULAR | Status: AC
  Administered 2015-11-10: 125 mg via INTRAVENOUS
  Filled 2015-11-10: qty 2

## 2015-11-10 MED ORDER — AZITHROMYCIN 250 MG PO TABS
500.0000 mg | ORAL_TABLET | Freq: Once | ORAL | Status: AC
Start: 2015-11-11 — End: 2015-11-11
  Administered 2015-11-11: 500 mg via ORAL
  Filled 2015-11-10: qty 2

## 2015-11-10 MED ORDER — IPRATROPIUM-ALBUTEROL 0.5-2.5 (3) MG/3ML IN SOLN
3.0000 mL | Freq: Once | RESPIRATORY_TRACT | Status: AC
  Administered 2015-11-10: 3 mL via RESPIRATORY_TRACT
  Filled 2015-11-10: qty 3

## 2015-11-10 NOTE — Progress Notes (Deleted)
Criticial ABG results shown to Dr. Dayna Barker in person. No further orders. RT will continue to monitor.

## 2015-11-10 NOTE — ED Notes (Signed)
Pt started feeling SOB today, per daughter she has been throwing up all day, pt having trouble cacthing her breath, and has old trach. No secretions noted, pt denies fever.

## 2015-11-11 ENCOUNTER — Other Ambulatory Visit: Payer: Self-pay | Admitting: Obstetrics and Gynecology

## 2015-11-11 MED ORDER — BENZONATATE 100 MG PO CAPS: 100.0000 mg | ORAL_CAPSULE | Freq: Three times a day (TID) | ORAL | Status: DC | PRN

## 2015-11-11 MED ORDER — AZITHROMYCIN 250 MG PO TABS: 250.0000 mg | ORAL_TABLET | Freq: Every day | ORAL | Status: DC

## 2015-11-11 MED ORDER — PREDNISONE 20 MG PO TABS: 40.0000 mg | ORAL_TABLET | Freq: Every day | ORAL | Status: DC

## 2015-11-11 NOTE — ED Provider Notes (Signed)
CSN: [id number]     Arrival date & time 11/10/15  2118 History   First MD Initiated Contact with Patient 11/10/15 2130     Chief Complaint  Patient presents with  . Respiratory Distress     (Consider location/radiation/quality/duration/timing/severity/associated sxs/prior Treatment) HPI Comments: 60 year old female with past medical history including laryngeal cancer s/p resection w/ trach, COPD, HTN who p/w shortness of breath. She states that she has a chronic cough but her cough has been worse over the past 3 days. No significant phlegm production. She reports that today throughout the day her shortness of breath has been gradually worsening and now she feels like she is having trouble catching her breath. She denies any chest pain or fevers. She has had some vomiting and diarrhea today, no sick contacts. No urinary symptoms or abdominal pain. She states that these symptoms feel like a COPD exacerbation. Her inhalers at home have not helped.  The history is provided by the patient.    Past Medical History  Diagnosis Date  . Hypertension   . 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[place])   . History of laryngectomy   . Bronchitis   . 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  [name] Heart murmur     asymptomatic   . Leukocytopenia   . Asthma   . Sinusitis, chronic 07/20/2011  . Normal MRI 07/14/11    negative for mestasis   . Hx of radiation therapy 09/03/10 to 10/16/2010    supraglottic larynx  . 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 Neck pain 01/21/2012  . GERD (gastroesophageal reflux disease)   . Sepsis ([place]) 08/04/12  . Seizures ([place])     07/24/11 off Effexor w/o seizure  . Anemia   . Sinusitis, chronic 07/20/2011    Bilateral maxillary, identified on MRI of head 07/14/11.    . Diverticulosis 11/15/2012     noted on screening colonoscopy   . Internal hemorrhoid 11/15/2012     small, noted on screening colonoscopy   . Nausea alone 07/28/2013  . COPD (chronic  obstructive pulmonary disease) ([place])   . Esophageal stricture    Past Surgical History  Procedure Laterality Date  . Portacath placement  09/17/10    Tip in cavoatrial junction  . Laryngectomy    . Tracheal dilitation  07/16/2011    Procedure: TRACHEAL DILITATION;  Surgeon: Beckie Salts, MD;  Location: [place];  Service: ENT;  Laterality: N/A;  dilation of tracheal stoma and replacement of stoma tube  . Foreign body removal bronchial  10/02/2011    Procedure: REMOVAL FOREIGN BODY BRONCHIAL;  Surgeon: Ruby Cola, MD;  Location: [place];  Service: ENT;  Laterality: N/A;  . Esophagoscopy  06/21/2012    Procedure: ESOPHAGOSCOPY;  Surgeon: [name], MD;  Location: [place];  Service: ENT;  Laterality: N/A;  . Tubal ligation  1982  . Colonoscopy N/A 11/15/2012    Procedure: COLONOSCOPY;  Surgeon: Lafayette Dragon, MD;  Location: [facility];  Service: Endoscopy;  Laterality: N/A;  . Breast surgery    . Esophagoscopy with dilitation N/A 09/21/2014    Procedure: ESOPHAGOSCOPY WITH DILITATION;  Surgeon: [name], MD;  Location: [facility];  Service: ENT;  Laterality: N/A;   Family History  Problem Relation Age of Onset  . Heart disease Mother   . Heart disease Father   . Heart disease Sister    Social History  Substance Use Topics  . Smoking status:  Former Smoker -- 2.00 packs/day for 20 years    Types: Cigarettes    Quit date: 09/17/2010  . Smokeless tobacco: Never Used  . Alcohol Use: No     Comment: Former drinker   OB History    No data available     Review of Systems 10 Systems reviewed and are negative for acute change except as noted in the HPI.    Allergies  Lisinopril  Home Medications   Prior to Admission medications   Medication Sig Start Date End Date Taking? Authorizing Provider  albuterol (PROVENTIL HFA) 108 (90 Base) MCG/ACT inhaler INHALE 2 PUFFS INTO THE LUNGS EVERY 4 HOURS AS NEEDED FOR WHEEZING OR SHORTNESS OF BREATH. 11/09/15  Yes Katheren Shams,  DO  buPROPion (WELLBUTRIN XL) 150 MG 24 hr tablet TAKE ONE TABLET BY MOUTH ONCE DAILY 11/09/15  Yes Heath Lark, MD  dextromethorphan-guaiFENesin (MUCINEX DM) 30-600 MG per 12 hr tablet Take 1 tablet by mouth 2 (two) times daily as needed for cough. 11/03/14  Yes Katheren Shams, DO  EQ LORATADINE 10 MG tablet TAKE ONE TABLET BY MOUTH ONCE DAILY 11/09/15  Yes Katheren Shams, DO  ibuprofen (ADVIL,MOTRIN) 600 MG tablet TAKE ONE TABLET BY MOUTH EVERY 8 HOURS AS NEEDED FOR  MODERATE  PAIN 08/17/15  Yes Katheren Shams, DO  levothyroxine (SYNTHROID, LEVOTHROID) 175 MCG tablet Take 1 tablet (175 mcg total) by mouth daily before breakfast. 11/09/15  Yes Katheren Shams, DO  losartan (COZAAR) 100 MG tablet TAKE ONE TABLET BY MOUTH  DAILY 08/17/15  Yes Katheren Shams, DO  MELATONIN PO Take 1 tablet by mouth at bedtime as needed (sleep).   Yes Historical Provider, MD  metoprolol tartrate (LOPRESSOR) 25 MG tablet TAKE ONE TABLET BY MOUTH TWICE DAILY 05/07/15  Yes Katheren Shams, DO  mometasone-formoterol (DULERA) 200-5 MCG/ACT AERO Inhale 2 puffs into the lungs 2 (two) times daily. 05/07/15  Yes Katheren Shams, DO  omeprazole (PRILOSEC) 40 MG capsule TAKE ONE CAPSULE BY MOUTH IN THE MORNING. TAKE AT LEAST FOUR HOURS AFTER TAKING LEVOTHYROXINE. 05/04/15  Yes Katheren Shams, DO  tiotropium (SPIRIVA HANDIHALER) 18 MCG inhalation capsule Place 1 capsule (18 mcg total) into inhaler and inhale daily. 11/09/15  Yes Katheren Shams, DO  azithromycin (ZITHROMAX) 250 MG tablet Take 1 tablet (250 mg total) by mouth daily. 11/11/15   Sharlett Iles, MD  cyclobenzaprine (FLEXERIL) 10 MG tablet TAKE ONE TABLET BY MOUTH TWICE DAILY AS NEEDED FOR MUSCLE SPASM 08/17/15   Katheren Shams, DO  ondansetron (ZOFRAN-ODT) 4 MG disintegrating tablet DISSOLVE ONE TABLET IN MOUTH EVERY 8 HOURS AS NEEDED FOR NAUSEA 09/20/15   Heath Lark, MD  predniSONE (DELTASONE) 20 MG tablet Take 2 tablets (40 mg total) by mouth daily. 11/11/15   Wenda Overland Little,  MD   BP 151/73 mmHg  Pulse 92  Temp(Src) 98.7 F (37.1 C) (Oral)  Resp 17  Wt 205 lb (92.987 kg)  SpO2 95% Physical Exam  Constitutional: She is oriented to person, place, and time. She appears well-developed and well-nourished. No distress.  Dyspneic  HENT:  Head: Normocephalic and atraumatic.  Moist mucous membranes  Eyes: Conjunctivae are normal. Pupils are equal, round, and reactive to light.  Neck: Neck supple.  Stoma without drainage  Cardiovascular: Normal rate, regular rhythm and normal heart sounds.   No murmur heard. Pulmonary/Chest:  Mild tachypnea w/ coarse BS b/l, occasional wheeze in upper lungs, diminished b/l bases  Abdominal:  Soft. Bowel sounds are normal. She exhibits no distension. There is no tenderness.  Musculoskeletal: She exhibits no edema.  Neurological: She is alert and oriented to person, place, and time.  Fluent speech  Skin: Skin is warm and dry. No erythema.  Psychiatric: She has a normal mood and affect. Judgment normal.  Nursing note and vitals reviewed.   ED Course  Procedures (including critical care time) Labs Review Labs Reviewed  BASIC METABOLIC PANEL - Abnormal; Notable for the following:    Glucose, Bld 103 (*)    Creatinine, Ser 1.03 (*)    GFR calc non Af Amer 59 (*)    All other components within normal limits  CBC WITH DIFFERENTIAL/PLATELET - Abnormal; Notable for the following:    Hemoglobin 11.9 (*)    All other components within normal limits    Imaging Review Dg Chest Portable 1 View  11/10/2015  CLINICAL DATA:  Acute onset of cough, congestion and shortness of breath. Initial encounter. EXAM: PORTABLE CHEST 1 VIEW COMPARISON:  Chest radiograph from 05/16/2014 FINDINGS: The lungs are well-aerated. Mild vascular congestion is noted. There is no evidence of focal opacification, pleural effusion or pneumothorax. The cardiomediastinal silhouette is borderline normal in size. No acute osseous abnormalities are seen. IMPRESSION:  Mild vascular congestion noted.  Lungs remain grossly clear. Electronically Signed   By: Garald Balding M.D.   On: 11/10/2015 23:34   I have personally reviewed and evaluated these images and lab results as part of my medical decision-making.   EKG Interpretation   Date/Time:  Saturday Nov 10 2015 21:38:37 EDT Ventricular Rate:  96 PR Interval:  225 QRS Duration: 92 QT Interval:  365 QTC Calculation: 461 R Axis:   -8 Text Interpretation:  Sinus rhythm Prolonged PR interval LAE, consider  biatrial enlargement Anterior infarct, possibly acute Lateral leads are  also involved Baseline wander in lead(s) V3 V4 V5 V6 Partial missing  lead(s): V2 V3 V4 V5 V6 No significant change since last tracing Confirmed  by LITTLE MD, RACHEL [phone]) on 11/10/2015 9:41:49 PM     Medications  ipratropium-albuterol (DUONEB) 0.5-2.5 (3) MG/3ML nebulizer solution 3 mL (3 mLs Nebulization Given 11/10/15 2224)  methylPREDNISolone sodium succinate (SOLU-MEDROL) 125 mg/2 mL injection 125 mg (125 mg Intravenous Given 11/10/15 2353)  azithromycin (ZITHROMAX) tablet 500 mg (500 mg Oral Given 11/11/15 0001)    MDM   Final diagnoses:  COPD exacerbation ([place])    Pt w/ h/o COPD and stoma from laryngeal cancer resection p/w Gradually worsening shortness of breath throughout today in the setting of 3 days of worsening cough. On arrival she was tachypneic and uncomfortable but in no respiratory distress. Initial vital signs show mild tachycardia, BP 175/122, O2 sat 97% on room air. She had coarse breath sounds bilaterally, occasional upper respiratory wheeze, diminished bilateral bases. Place the patient on trach collar and gave DuoNeb. Obtained chest x-ray which showed mild vascular congestion, otherwise clear lungs. The patient denies any history of heart failure. Patient stated her breathing was improved after DuoNeb, therefore gave Solu-Medrol and azithromycin to treat COPD exacerbation. Basic lab work here is consistent  with previous labs, no new findings. EKG with no significant change from previous. The patient has denied any chest pain and states that this feels like COPD. On reexamination, she is 96% on RA, breathing comfortably, and able to converse without difficulty. She states she feels better. I considered PE however the patient has improved with above treatments and she states that symptoms were  all gradual rather than sudden. She denies any recent travel, leg swelling, or h/o blood clot. I discussed COPD treatment and gave the patient prescriptions for Solu-Medrol and azithromycin as well as Tessalon. Instructed to follow-up with PCP. I extensively reviewed return precautions and the patient voiced understanding. She was comfortable with plan. Patient discharged in satisfactory condition.   Sharlett Iles, MD 11/11/15 Laureen Abrahams

## 2015-11-11 NOTE — ED Notes (Signed)
Patient is alert and orientedx4.  Patient was explained discharge instructions and they understood them with no questions.  The daughter is taking her home.

## 2015-11-13 NOTE — Telephone Encounter (Signed)
Appt 11/26/15 at 2:30 PM. Derl Barrow, RN

## 2015-11-26 ENCOUNTER — Ambulatory Visit (INDEPENDENT_AMBULATORY_CARE_PROVIDER_SITE_OTHER): Payer: Medicaid Other | Admitting: Obstetrics and Gynecology

## 2015-11-26 ENCOUNTER — Encounter: Payer: Self-pay | Admitting: Obstetrics and Gynecology

## 2015-11-26 VITALS — BP 119/83 | HR 94 | Temp 98.2°F | Ht 67.0 in | Wt 204.0 lb

## 2015-11-26 DIAGNOSIS — Z0000 Encounter for general adult medical examination without abnormal findings: Secondary | ICD-10-CM

## 2015-11-26 DIAGNOSIS — E039 Hypothyroidism, unspecified: Secondary | ICD-10-CM

## 2015-11-26 DIAGNOSIS — Z1159 Encounter for screening for other viral diseases: Secondary | ICD-10-CM

## 2015-11-26 DIAGNOSIS — Z114 Encounter for screening for human immunodeficiency virus [HIV]: Secondary | ICD-10-CM

## 2015-11-26 MED ORDER — IBUPROFEN 600 MG PO TABS: ORAL_TABLET | ORAL | Status: DC

## 2015-11-26 MED ORDER — BENZONATATE 100 MG PO CAPS: 100.0000 mg | ORAL_CAPSULE | Freq: Three times a day (TID) | ORAL | Status: DC | PRN

## 2015-11-26 MED ORDER — TIOTROPIUM BROMIDE MONOHYDRATE 18 MCG IN CAPS: 18.0000 ug | ORAL_CAPSULE | Freq: Every day | RESPIRATORY_TRACT | Status: DC

## 2015-11-26 MED ORDER — LEVOTHYROXINE SODIUM 175 MCG PO TABS: 175.0000 ug | ORAL_TABLET | Freq: Every day | ORAL | Status: DC

## 2015-11-26 NOTE — Patient Instructions (Signed)
Will contact you about your labs  Health Maintenance, Female Adopting a healthy lifestyle and getting preventive care can go a long way to promote health and wellness. Talk with your health care provider about what schedule of regular examinations is right for you. This is a good chance for you to check in with your provider about disease prevention and staying healthy. In between checkups, there are plenty of things you can do on your own. Experts have done a lot of research about which lifestyle changes and preventive measures are most likely to keep you healthy. Ask your health care provider for more information. WEIGHT AND DIET  Eat a healthy diet  Be sure to include plenty of vegetables, fruits, low-fat dairy products, and lean protein.  Do not eat a lot of foods high in solid fats, added sugars, or salt.  Get regular exercise. This is one of the most important things you can do for your health.  Most adults should exercise for at least 150 minutes each week. The exercise should increase your heart rate and make you sweat (moderate-intensity exercise).  Most adults should also do strengthening exercises at least twice a week. This is in addition to the moderate-intensity exercise.  Maintain a healthy weight  Body mass index (BMI) is a measurement that can be used to identify possible weight problems. It estimates body fat based on height and weight. Your health care provider can help determine your BMI and help you achieve or maintain a healthy weight.  For females 32 years of age and older:   A BMI below 18.5 is considered underweight.  A BMI of 18.5 to 24.9 is normal.  A BMI of 25 to 29.9 is considered overweight.  A BMI of 30 and above is considered obese.  Watch levels of cholesterol and blood lipids  You should start having your blood tested for lipids and cholesterol at 58 years of age, then have this test every 5 years.  You may need to have your cholesterol levels  checked more often if:  Your lipid or cholesterol levels are high.  You are older than 58 years of age.  You are at high risk for heart disease.  CANCER SCREENING   Lung Cancer  Lung cancer screening is recommended for adults 61-51 years old who are at high risk for lung cancer because of a history of smoking.  A yearly low-dose CT scan of the lungs is recommended for people who:  Currently smoke.  Have quit within the past 15 years.  Have at least a 30-pack-year history of smoking. A pack year is smoking an average of one pack of cigarettes a day for 1 year.  Yearly screening should continue until it has been 15 years since you quit.  Yearly screening should stop if you develop a health problem that would prevent you from having lung cancer treatment.  Breast Cancer  Practice breast self-awareness. This means understanding how your breasts normally appear and feel.  It also means doing regular breast self-exams. Let your health care provider know about any changes, no matter how small.  If you are in your 20s or 30s, you should have a clinical breast exam (CBE) by a health care provider every 1-3 years as part of a regular health exam.  If you are 15 or older, have a CBE every year. Also consider having a breast X-ray (mammogram) every year.  If you have a family history of breast cancer, talk to your health care  provider about genetic screening.  If you are at high risk for breast cancer, talk to your health care provider about having an MRI and a mammogram every year.  Breast cancer gene (BRCA) assessment is recommended for women who have family members with BRCA-related cancers. BRCA-related cancers include:  Breast.  Ovarian.  Tubal.  Peritoneal cancers.  Results of the assessment will determine the need for genetic counseling and BRCA1 and BRCA2 testing. Cervical Cancer Your health care provider may recommend that you be screened regularly for cancer of the  pelvic organs (ovaries, uterus, and vagina). This screening involves a pelvic examination, including checking for microscopic changes to the surface of your cervix (Pap test). You may be encouraged to have this screening done every 3 years, beginning at age 17.  For women ages 42-65, health care providers may recommend pelvic exams and Pap testing every 3 years, or they may recommend the Pap and pelvic exam, combined with testing for human papilloma virus (HPV), every 5 years. Some types of HPV increase your risk of cervical cancer. Testing for HPV may also be done on women of any age with unclear Pap test results.  Other health care providers may not recommend any screening for nonpregnant women who are considered low risk for pelvic cancer and who do not have symptoms. Ask your health care provider if a screening pelvic exam is right for you.  If you have had past treatment for cervical cancer or a condition that could lead to cancer, you need Pap tests and screening for cancer for at least 20 years after your treatment. If Pap tests have been discontinued, your risk factors (such as having a new sexual partner) need to be reassessed to determine if screening should resume. Some women have medical problems that increase the chance of getting cervical cancer. In these cases, your health care provider may recommend more frequent screening and Pap tests. Colorectal Cancer  This type of cancer can be detected and often prevented.  Routine colorectal cancer screening usually begins at 58 years of age and continues through 58 years of age.  Your health care provider may recommend screening at an earlier age if you have risk factors for colon cancer.  Your health care provider may also recommend using home test kits to check for hidden blood in the stool.  A small camera at the end of a tube can be used to examine your colon directly (sigmoidoscopy or colonoscopy). This is done to check for the earliest  forms of colorectal cancer.  Routine screening usually begins at age 10.  Direct examination of the colon should be repeated every 5-10 years through 58 years of age. However, you may need to be screened more often if early forms of precancerous polyps or small growths are found. Skin Cancer  Check your skin from head to toe regularly.  Tell your health care provider about any new moles or changes in moles, especially if there is a change in a mole's shape or color.  Also tell your health care provider if you have a mole that is larger than the size of a pencil eraser.  Always use sunscreen. Apply sunscreen liberally and repeatedly throughout the day.  Protect yourself by wearing long sleeves, pants, a wide-brimmed hat, and sunglasses whenever you are outside. HEART DISEASE, DIABETES, AND HIGH BLOOD PRESSURE   High blood pressure causes heart disease and increases the risk of stroke. High blood pressure is more likely to develop in:  People who  have blood pressure in the high end of the normal range (130-139/85-89 mm Hg).  People who are overweight or obese.  People who are African American.  If you are 21-15 years of age, have your blood pressure checked every 3-5 years. If you are 19 years of age or older, have your blood pressure checked every year. You should have your blood pressure measured twice--once when you are at a hospital or clinic, and once when you are not at a hospital or clinic. Record the average of the two measurements. To check your blood pressure when you are not at a hospital or clinic, you can use:  An automated blood pressure machine at a pharmacy.  A home blood pressure monitor.  If you are between 51 years and 61 years old, ask your health care provider if you should take aspirin to prevent strokes.  Have regular diabetes screenings. This involves taking a blood sample to check your fasting blood sugar level.  If you are at a normal weight and have a low  risk for diabetes, have this test once every three years after 58 years of age.  If you are overweight and have a high risk for diabetes, consider being tested at a younger age or more often. PREVENTING INFECTION  Hepatitis B  If you have a higher risk for hepatitis B, you should be screened for this virus. You are considered at high risk for hepatitis B if:  You were born in a country where hepatitis B is common. Ask your health care provider which countries are considered high risk.  Your parents were born in a high-risk country, and you have not been immunized against hepatitis B (hepatitis B vaccine).  You have HIV or AIDS.  You use needles to inject street drugs.  You live with someone who has hepatitis B.  You have had sex with someone who has hepatitis B.  You get hemodialysis treatment.  You take certain medicines for conditions, including cancer, organ transplantation, and autoimmune conditions. Hepatitis C  Blood testing is recommended for:  Everyone born from [birth year] through 1963.  Anyone with known risk factors for hepatitis C. Sexually transmitted infections (STIs)  You should be screened for sexually transmitted infections (STIs) including gonorrhea and chlamydia if:  You are sexually active and are younger than 58 years of age.  You are older than 58 years of age and your health care provider tells you that you are at risk for this type of infection.  Your sexual activity has changed since you were last screened and you are at an increased risk for chlamydia or gonorrhea. Ask your health care provider if you are at risk.  If you do not have HIV, but are at risk, it may be recommended that you take a prescription medicine daily to prevent HIV infection. This is called pre-exposure prophylaxis (PrEP). You are considered at risk if:  You are sexually active and do not regularly use condoms or know the HIV status of your partner(s).  You take drugs by  injection.  You are sexually active with a partner who has HIV. Talk with your health care provider about whether you are at high risk of being infected with HIV. If you choose to begin PrEP, you should first be tested for HIV. You should then be tested every 3 months for as long as you are taking PrEP.  PREGNANCY   If you are premenopausal and you may become pregnant, ask your health care provider  about preconception counseling.  If you may become pregnant, take 400 to 800 micrograms (mcg) of folic acid every day.  If you want to prevent pregnancy, talk to your health care provider about birth control (contraception). OSTEOPOROSIS AND MENOPAUSE   Osteoporosis is a disease in which the bones lose minerals and strength with aging. This can result in serious bone fractures. Your risk for osteoporosis can be identified using a bone density scan.  If you are 44 years of age or older, or if you are at risk for osteoporosis and fractures, ask your health care provider if you should be screened.  Ask your health care provider whether you should take a calcium or vitamin D supplement to lower your risk for osteoporosis.  Menopause may have certain physical symptoms and risks.  Hormone replacement therapy may reduce some of these symptoms and risks. Talk to your health care provider about whether hormone replacement therapy is right for you.  HOME CARE INSTRUCTIONS   Schedule regular health, dental, and eye exams.  Stay current with your immunizations.   Do not use any tobacco products including cigarettes, chewing tobacco, or electronic cigarettes.  If you are pregnant, do not drink alcohol.  If you are breastfeeding, limit how much and how often you drink alcohol.  Limit alcohol intake to no more than 1 drink per day for nonpregnant women. One drink equals 12 ounces of beer, 5 ounces of wine, or 1 ounces of hard liquor.  Do not use street drugs.  Do not share needles.  Ask your  health care provider for help if you need support or information about quitting drugs.  Tell your health care provider if you often feel depressed.  Tell your health care provider if you have ever been abused or do not feel safe at home.   This information is not intended to replace advice given to you by your health care provider. Make sure you discuss any questions you have with your health care provider.   Document Released: 12/16/2010 Document Revised: 06/23/2014 Document Reviewed: 05/04/2013 Elsevier Interactive Patient Education [employer].

## 2015-11-26 NOTE — Progress Notes (Signed)
Subjective: Chief Complaint  Patient presents with  . Annual Exam    Medication refills      HPI: Deborah [name] is a 60 y.o. presenting to clinic today for annual examination.  Acute Concerns: None  Sexual History: Yes, one partner. Feels safe inr elationship  Birth history: G3P2  POA/Living Will: Yes, Daughter Elder Cyphers, DNR  Social:  Social History   Social History  . Marital Status: Divorced    Spouse Name: N/A  . Number of Children: N/A  . Years of Education: 62   Social History Main Topics  . Smoking status: Former Smoker -- 2.00 packs/day for 20 years    Types: Cigarettes    Quit date: 09/17/2010  . Smokeless tobacco: Never Used  . Alcohol Use: No     Comment: Former drinker  . Drug Use: Yes    Special: Cocaine     Comment: tried cocaine 1 time 2010 only used 1 time  . Sexual Activity: Yes    Birth Control/ Protection: Surgical   Other Topics Concern  . None   Social History Narrative   Unemployed. Receives SSI. Lives with one of her 2 adult daughters Jazlyne Gutter) and her sister [name])     Current Outpatient Prescriptions  Medication Sig Dispense Refill  . albuterol (PROVENTIL HFA) 108 (90 Base) MCG/ACT inhaler INHALE 2 PUFFS INTO THE LUNGS EVERY 4 HOURS AS NEEDED FOR WHEEZING OR SHORTNESS OF BREATH. 2 each 0  . azithromycin (ZITHROMAX) 250 MG tablet Take 1 tablet (250 mg total) by mouth daily. 4 tablet 0  . benzonatate (TESSALON) 100 MG capsule Take 1 capsule (100 mg total) by mouth 3 (three) times daily as needed for cough. 12 capsule 0  . buPROPion (WELLBUTRIN XL) 150 MG 24 hr tablet TAKE ONE TABLET BY MOUTH ONCE DAILY 90 tablet 0  . cyclobenzaprine (FLEXERIL) 10 MG tablet TAKE ONE TABLET BY MOUTH TWICE DAILY AS NEEDED FOR MUSCLE SPASM 60 tablet 2  . dextromethorphan-guaiFENesin (MUCINEX DM) 30-600 MG per 12 hr tablet Take 1 tablet by mouth 2 (two) times daily as needed for cough. 30 tablet 2  . EQ LORATADINE  10 MG tablet TAKE ONE TABLET BY MOUTH ONCE DAILY 30 tablet 2  . ibuprofen (ADVIL,MOTRIN) 600 MG tablet TAKE ONE TABLET BY MOUTH EVERY 8 HOURS AS NEEDED FOR  MODERATE  PAIN 30 tablet 0  . levothyroxine (SYNTHROID, LEVOTHROID) 175 MCG tablet Take 1 tablet (175 mcg total) by mouth daily before breakfast. 60 tablet 0  . losartan (COZAAR) 100 MG tablet TAKE ONE TABLET BY MOUTH  DAILY 30 tablet 5  . MELATONIN PO Take 1 tablet by mouth at bedtime as needed (sleep).    . metoprolol tartrate (LOPRESSOR) 25 MG tablet TAKE ONE TABLET BY MOUTH TWICE DAILY 60 tablet 3  . mometasone-formoterol (DULERA) 200-5 MCG/ACT AERO Inhale 2 puffs into the lungs 2 (two) times daily. 1 Inhaler 6  . omeprazole (PRILOSEC) 40 MG capsule TAKE ONE CAPSULE BY MOUTH IN THE MORNING. TAKE AT LEAST FOUR HOURS AFTER TAKING LEVOTHYROXINE. 30 capsule 3  . ondansetron (ZOFRAN-ODT) 4 MG disintegrating tablet DISSOLVE ONE TABLET IN MOUTH EVERY 8 HOURS AS NEEDED FOR NAUSEA 60 tablet 0  . predniSONE (DELTASONE) 20 MG tablet Take 2 tablets (40 mg total) by mouth daily. 10 tablet 0  . tiotropium (SPIRIVA HANDIHALER) 18 MCG inhalation capsule Place 1 capsule (18 mcg total) into inhaler and inhale daily. 30 capsule 2   No  current facility-administered medications for this visit.   Allergies: Lisinopril  No LMP recorded. Patient is postmenopausal.  Immunization:  Tdap/TD:   Influenza: Not in season  Cancer Screening:  Pap Smear: Due but declines  Mammogram: Due but declines states she does self breast exams  Colonoscopy: Up to Date  ROS reviewed and were negative unless otherwise noted in HPI.  ROS:  Feeling well. No dyspnea or chest pain on exertion.  No abdominal pain, change in bowel habits, black or bloody stools.  No urinary tract symptoms. GYN ROS: normal menses, no abnormal bleeding, pelvic pain or discharge, no breast pain or new or enlarging lumps on self exam. No neurological complaints.  Past Medical, Surgical, Social, and  Family History Reviewed & Updated per EMR. Smoking status - Former Smoker   Objective: BP 119/83 mmHg  Pulse 94  Temp(Src) 98.2 F (36.8 C) (Oral)  Ht 5\' 7"  (1.702 m)  Wt 204 lb (92.534 kg)  BMI 31.94 kg/m2  SpO2 97% Vitals and nursing notes reviewed  Physical Exam  The patient appears well, alert, oriented x 3, in no distress. ENT normal.  Neck tracheostomy in situ. Her neck is woody from prior radiation and surgery. PERLA. Lungs are clear, good air entry, no wheezes, rhonchi or rales. S1 and S2 normal, no murmurs, regular rate and rhythm. Abdomen soft without tenderness, guarding, mass or organomegaly. Extremities show no edema, normal peripheral pulses. Neurological is normal, no focal findings.  Breast and pelvic exam deferred; patient declined  Assessment/Plan: 60 y.o. female presents for annual exam.    ASSESSMENT:  well woman  PLAN:  Mammogram patient declined at this time pap smear declined. Will continue to encourage counseled on breast self exam, mammography screening and adequate intake of calcium and vitamin D additional lab tests per orders return annually or prn Medications refilled Handout given   Orders Placed This Encounter  Procedures  . HIV antibody  . Hepatitis C antibody    Meds ordered this encounter  Medications  . levothyroxine (SYNTHROID, LEVOTHROID) 175 MCG tablet    Sig: Take 1 tablet (175 mcg total) by mouth daily before breakfast.    Dispense:  60 tablet    Refill:  0  . tiotropium (SPIRIVA HANDIHALER) 18 MCG inhalation capsule    Sig: Place 1 capsule (18 mcg total) into inhaler and inhale daily.    Dispense:  30 capsule    Refill:  2  . benzonatate (TESSALON) 100 MG capsule    Sig: Take 1 capsule (100 mg total) by mouth 3 (three) times daily as needed for cough.    Dispense:  30 capsule    Refill:  0  . ibuprofen (ADVIL,MOTRIN) 600 MG tablet    Sig: TAKE ONE TABLET BY MOUTH EVERY 8 HOURS AS NEEDED FOR  MODERATE  PAIN     Dispense:  30 tablet    Refill:  [place], DO 11/26/2015, 2:53 PM PGY-2, [place]

## 2015-11-27 ENCOUNTER — Encounter: Payer: Self-pay | Admitting: Obstetrics and Gynecology

## 2015-11-27 LAB — HEPATITIS C ANTIBODY: HCV Ab: NEGATIVE

## 2015-11-27 LAB — HIV ANTIBODY (ROUTINE TESTING W REFLEX): HIV: NONREACTIVE

## 2015-11-28 ENCOUNTER — Encounter: Payer: Self-pay | Admitting: Obstetrics and Gynecology

## 2015-12-08 ENCOUNTER — Other Ambulatory Visit: Payer: Self-pay | Admitting: Obstetrics and Gynecology

## 2015-12-27 ENCOUNTER — Encounter: Payer: Self-pay | Admitting: Family Medicine

## 2015-12-27 ENCOUNTER — Other Ambulatory Visit (HOSPITAL_COMMUNITY)
Admission: RE | Admit: 2015-12-27 | Discharge: 2015-12-27 | Disposition: A | Discharge status: Home / Self Care | Payer: Medicaid Other | Source: Ambulatory Visit | Attending: Family Medicine | Admitting: Family Medicine

## 2015-12-27 ENCOUNTER — Ambulatory Visit (INDEPENDENT_AMBULATORY_CARE_PROVIDER_SITE_OTHER): Payer: Medicaid Other | Admitting: Family Medicine

## 2015-12-27 VITALS — BP 154/91 | HR 96 | Temp 98.1°F | Ht 67.0 in | Wt 200.0 lb

## 2015-12-27 DIAGNOSIS — Z124 Encounter for screening for malignant neoplasm of cervix: Secondary | ICD-10-CM | POA: Diagnosis not present

## 2015-12-27 DIAGNOSIS — Z1151 Encounter for screening for human papillomavirus (HPV): Secondary | ICD-10-CM | POA: Insufficient documentation

## 2015-12-27 DIAGNOSIS — Z01419 Encounter for gynecological examination (general) (routine) without abnormal findings: Secondary | ICD-10-CM | POA: Diagnosis present

## 2015-12-27 DIAGNOSIS — Z0000 Encounter for general adult medical examination without abnormal findings: Secondary | ICD-10-CM | POA: Diagnosis not present

## 2015-12-27 DIAGNOSIS — J441 Chronic obstructive pulmonary disease with (acute) exacerbation: Secondary | ICD-10-CM | POA: Diagnosis present

## 2015-12-27 NOTE — Patient Instructions (Signed)
Thank you so much for coming back for your pap smear. You will be contacted with the results over the next several days.  Dr. Gerlean Ren

## 2015-12-29 DIAGNOSIS — Z0000 Encounter for general adult medical examination without abnormal findings: Secondary | ICD-10-CM | POA: Insufficient documentation

## 2015-12-29 NOTE — Assessment & Plan Note (Signed)
-   Pap Smear today - Handout on scheduling mammogram given - Follow up with PCP

## 2015-12-29 NOTE — Progress Notes (Signed)
Subjective:     Patient ID: Deborah [name], female   DOB: 1956/01/23, 60 y.o.   MRN: [id number]  HPI Mrs. Deborah [name] is a 60yo female presenting today for pap smear. - No past pap smears documented in Epic - Was counseled on pap smear and mammogram at last office visit. Has decided she would like to get up to date on these important screening tests. - No other acute complaints.   Review of Systems Per HPI. Other systems negative.    Objective:   Physical Exam  Constitutional: She appears well-developed and well-nourished. No distress.  Cardiovascular: Regular rhythm.   Pulmonary/Chest: Effort normal. No respiratory distress.  Genitourinary:  No genital ulcers noted. No vaginal discharge noted. Ovaries not palpable. No vaginal tenderness.   Psychiatric: She has a normal mood and affect. Her behavior is normal.      Assessment and Plan:     Health care maintenance - Pap Smear today - Handout on scheduling mammogram given - Follow up with PCP

## 2015-12-31 LAB — CYTOLOGY - PAP

## 2016-01-01 ENCOUNTER — Other Ambulatory Visit: Payer: Self-pay | Admitting: Family Medicine

## 2016-01-01 ENCOUNTER — Other Ambulatory Visit: Payer: Self-pay | Admitting: Obstetrics and Gynecology

## 2016-01-01 DIAGNOSIS — Z1231 Encounter for screening mammogram for malignant neoplasm of breast: Secondary | ICD-10-CM

## 2016-01-03 ENCOUNTER — Telehealth: Payer: Self-pay | Admitting: Family Medicine

## 2016-01-03 MED ORDER — FLUCONAZOLE 150 MG PO TABS: 150.0000 mg | ORAL_TABLET | Freq: Once | ORAL | Status: DC

## 2016-01-03 NOTE — Telephone Encounter (Signed)
Contacted concerning lab results. Pap smear was normal except for yeast. Will send in dose of Diflucan. Patient also has Mammogram scheduled for 7/27. Encouraged her to keep the appointment.  Reports laryngitis over the last two days. Seems to be improving. Will schedule appointment if symptoms fail to improve over the next week.

## 2016-01-08 ENCOUNTER — Encounter (HOSPITAL_COMMUNITY): Payer: Self-pay | Admitting: Dentistry

## 2016-01-09 ENCOUNTER — Encounter (HOSPITAL_COMMUNITY): Payer: Self-pay | Admitting: Dentistry

## 2016-01-09 ENCOUNTER — Ambulatory Visit
Admission: RE | Admit: 2016-01-09 | Discharge: 2016-01-09 | Disposition: A | Discharge status: Home / Self Care | Payer: Medicaid Other | Source: Ambulatory Visit | Attending: Pediatric Surgery | Admitting: Pediatric Surgery

## 2016-01-09 DIAGNOSIS — Z1231 Encounter for screening mammogram for malignant neoplasm of breast: Secondary | ICD-10-CM

## 2016-01-10 ENCOUNTER — Ambulatory Visit: Payer: Self-pay

## 2016-01-29 ENCOUNTER — Encounter (HOSPITAL_COMMUNITY): Payer: Self-pay | Admitting: Dentistry

## 2016-01-30 ENCOUNTER — Encounter (HOSPITAL_COMMUNITY): Payer: Self-pay | Admitting: Emergency Medicine

## 2016-01-30 ENCOUNTER — Emergency Department (HOSPITAL_COMMUNITY)
Admission: EM | Admit: 2016-01-30 | Discharge: 2016-01-31 | Disposition: A | Discharge status: Home / Self Care | Payer: Medicaid Other | Attending: Emergency Medicine | Admitting: Emergency Medicine

## 2016-01-30 ENCOUNTER — Ambulatory Visit (HOSPITAL_COMMUNITY): Payer: Self-pay | Admitting: Dentistry

## 2016-01-30 ENCOUNTER — Encounter (HOSPITAL_COMMUNITY): Payer: Self-pay | Admitting: Dentistry

## 2016-01-30 ENCOUNTER — Encounter (INDEPENDENT_AMBULATORY_CARE_PROVIDER_SITE_OTHER): Payer: Self-pay

## 2016-01-30 VITALS — BP 118/66 | HR 72 | Temp 98.1°F

## 2016-01-30 DIAGNOSIS — R112 Nausea with vomiting, unspecified: Secondary | ICD-10-CM | POA: Diagnosis present

## 2016-01-30 DIAGNOSIS — R1084 Generalized abdominal pain: Secondary | ICD-10-CM | POA: Insufficient documentation

## 2016-01-30 DIAGNOSIS — C321 Malignant neoplasm of supraglottis: Secondary | ICD-10-CM

## 2016-01-30 DIAGNOSIS — Z972 Presence of dental prosthetic device (complete) (partial): Secondary | ICD-10-CM

## 2016-01-30 DIAGNOSIS — E039 Hypothyroidism, unspecified: Secondary | ICD-10-CM | POA: Diagnosis not present

## 2016-01-30 DIAGNOSIS — Z923 Personal history of irradiation: Secondary | ICD-10-CM

## 2016-01-30 DIAGNOSIS — K000 Anodontia: Secondary | ICD-10-CM

## 2016-01-30 DIAGNOSIS — J449 Chronic obstructive pulmonary disease, unspecified: Secondary | ICD-10-CM | POA: Diagnosis not present

## 2016-01-30 DIAGNOSIS — K08109 Complete loss of teeth, unspecified cause, unspecified class: Secondary | ICD-10-CM

## 2016-01-30 DIAGNOSIS — Z90710 Acquired absence of both cervix and uterus: Secondary | ICD-10-CM

## 2016-01-30 DIAGNOSIS — Z79899 Other long term (current) drug therapy: Secondary | ICD-10-CM | POA: Insufficient documentation

## 2016-01-30 DIAGNOSIS — Z8521 Personal history of malignant neoplasm of larynx: Secondary | ICD-10-CM | POA: Diagnosis not present

## 2016-01-30 DIAGNOSIS — R197 Diarrhea, unspecified: Secondary | ICD-10-CM | POA: Insufficient documentation

## 2016-01-30 DIAGNOSIS — Z463 Encounter for fitting and adjustment of dental prosthetic device: Secondary | ICD-10-CM

## 2016-01-30 DIAGNOSIS — Z5189 Encounter for other specified aftercare: Secondary | ICD-10-CM

## 2016-01-30 DIAGNOSIS — Z87891 Personal history of nicotine dependence: Secondary | ICD-10-CM | POA: Insufficient documentation

## 2016-01-30 DIAGNOSIS — I10 Essential (primary) hypertension: Secondary | ICD-10-CM | POA: Diagnosis not present

## 2016-01-30 LAB — CBC
HEMATOCRIT: 36.4 % (ref 36.0–46.0)
Hemoglobin: 11.6 g/dL — ABNORMAL LOW (ref 12.0–15.0)
MCH: 29.8 pg (ref 26.0–34.0)
MCHC: 31.9 g/dL (ref 30.0–36.0)
MCV: 93.6 fL (ref 78.0–100.0)
PLATELETS: 298 10*3/uL (ref 150–400)
RBC: 3.89 MIL/uL (ref 3.87–5.11)
RDW: 13.8 % (ref 11.5–15.5)
WBC: 3.6 10*3/uL — ABNORMAL LOW (ref 4.0–10.5)

## 2016-01-30 LAB — COMPREHENSIVE METABOLIC PANEL
ALBUMIN: 3.9 g/dL (ref 3.5–5.0)
ALT: 17 U/L (ref 14–54)
AST: 26 U/L (ref 15–41)
Alkaline Phosphatase: 71 U/L (ref 38–126)
Anion gap: 5 (ref 5–15)
BILIRUBIN TOTAL: 0.3 mg/dL (ref 0.3–1.2)
BUN: 16 mg/dL (ref 6–20)
CHLORIDE: 107 mmol/L (ref 101–111)
CO2: 27 mmol/L (ref 22–32)
Calcium: 8.7 mg/dL — ABNORMAL LOW (ref 8.9–10.3)
Creatinine, Ser: 1.14 mg/dL — ABNORMAL HIGH (ref 0.44–1.00)
GFR calc Af Amer: >60 mL/min (ref >60)
GFR calc non Af Amer: 52 mL/min — ABNORMAL LOW (ref >60)
GLUCOSE: 86 mg/dL (ref 65–99)
POTASSIUM: 3.7 mmol/L (ref 3.5–5.1)
Sodium: 139 mmol/L (ref 135–145)
Total Protein: 8.1 g/dL (ref 6.5–8.1)

## 2016-01-30 LAB — LIPASE, BLOOD: LIPASE: 27 U/L (ref 11–51)

## 2016-01-30 MED ORDER — ONDANSETRON HCL 4 MG/2ML IJ SOLN
4.0000 mg | Freq: Once | INTRAMUSCULAR | Status: AC
  Administered 2016-01-30: 4 mg via INTRAVENOUS
  Filled 2016-01-30: qty 2

## 2016-01-30 MED ORDER — SODIUM CHLORIDE 0.9 % IV BOLUS (SEPSIS): 1000.0000 mL | Freq: Once | INTRAVENOUS | Status: DC

## 2016-01-30 NOTE — ED Provider Notes (Signed)
[facility] DEPT Provider Note   CSN: [id number] Arrival date & time: 01/30/16  [id number]  By signing my name below, I, Higinio Plan, attest that this documentation has been prepared under the direction and in the presence of non-physician practitioner, Charlann Lange, PA-C.    Electronically Signed: Higinio Plan, Scribe. 01/30/2016. 10:24 PM.  History   Chief Complaint Chief Complaint  Patient presents with  . Abdominal Pain  . Diarrhea  . Nausea   The history is provided by the patient. No language interpreter was used.   HPI Comments: Deborah [name] is a 60 y.o. female with PMHx of HTN and sepsis, who presents to the Emergency Department complaining of gradually worsening, 10/10, generalized abdominal pain, vomiting and diarrhea that began 1 week ago. She reports she has vomited~4 times every day for the past week; she notes she feels nauseous now in the ED. Pt states associated associated fever (TMAX 101), loss of appetite, decreased amount of urine output and lightheadedness 3 days ago. She states her pain is temporary relieved after having a BM. Pt notes she has used many over the counter medications with no relief. She denies hematemesis and blood in her stool.   Past Medical History:  Diagnosis Date  . Anemia   . Asthma   . Bronchitis   . COPD (chronic obstructive pulmonary disease) ([place])   . Diverticulosis 11/15/2012    noted on screening colonoscopy   . Esophageal stricture   . Former smoker 03/19/2011  . GERD (gastroesophageal reflux disease)   . Heart murmur    asymptomatic   . History of laryngectomy   . 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 Hypertension   . 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 Internal hemorrhoid 11/15/2012    small, noted on screening colonoscopy   . 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  . Leukocytopenia   . Nausea alone 07/28/2013  . Neck pain 01/21/2012  . Normal MRI  07/14/11   negative for mestasis   . Seizures ([place])    07/24/11 off Effexor w/o seizure  . Sepsis ([place]) 08/04/12  . Sinusitis, chronic 07/20/2011  . Sinusitis, chronic 07/20/2011   Bilateral maxillary, identified on MRI of head 07/14/11.    . Tracheostomy dependent [facility])    Patient Active Problem List   Diagnosis Date Noted  . Health care maintenance 12/29/2015  . Bilateral knee pain 05/31/2015  . Dysphagia 08/16/2014  . Weight gain 08/05/2014  . COPD exacerbation ([place]) 04/29/2014  . Anxiety state, unspecified 01/18/2014  . Frequent falls 12/23/2013  . Bilateral shoulder pain 04/07/2013  . Anemia in chronic illness 08/11/2012  . Status post trachelectomy 08/04/2012  . Chronic pain 01/20/2012  . History of head and neck cancer 09/26/2011  . Hx of radiation therapy   . Seizure ([place]) 07/17/2011  . Acquired hypothyroidism 03/19/2011  . HTN (hypertension) 03/19/2011    Past Surgical History:  Procedure Laterality Date  . BREAST SURGERY    . COLONOSCOPY N/A 11/15/2012   Procedure: COLONOSCOPY;  Surgeon: Lafayette Dragon, MD;  Location: [facility];  Service: Endoscopy;  Laterality: N/A;  . ESOPHAGOSCOPY  06/21/2012   Procedure: ESOPHAGOSCOPY;  Surgeon: [name], MD;  Location: [facility];  Service: ENT;  Laterality: N/A;  . ESOPHAGOSCOPY WITH DILITATION N/A 09/21/2014   Procedure: ESOPHAGOSCOPY WITH DILITATION;  Surgeon: [name], MD;  Location: [facility];  Service: ENT;  Laterality:  N/A;  . FOREIGN BODY REMOVAL BRONCHIAL  10/02/2011   Procedure: REMOVAL FOREIGN BODY BRONCHIAL;  Surgeon: Ruby Cola, MD;  Location: [facility];  Service: ENT;  Laterality: N/A;  . LARYNGECTOMY    . PORTACATH PLACEMENT  09/17/10   Tip in cavoatrial junction  . TRACHEAL DILITATION  07/16/2011   Procedure: TRACHEAL DILITATION;  Surgeon: Beckie Salts, MD;  Location: [facility];  Service: ENT;  Laterality: N/A;  dilation of tracheal stoma and replacement of stoma tube  . TUBAL LIGATION  1982    OB History      No data available     Home Medications    Prior to Admission medications   Medication Sig Start Date End Date Taking? Authorizing Provider  albuterol (PROVENTIL HFA) 108 (90 Base) MCG/ACT inhaler INHALE 2 PUFFS INTO THE LUNGS EVERY 4 HOURS AS NEEDED FOR WHEEZING OR SHORTNESS OF BREATH. 11/09/15   Katheren Shams, DO  benzonatate (TESSALON) 100 MG capsule Take 1 capsule (100 mg total) by mouth 3 (three) times daily as needed for cough. 11/26/15   Katheren Shams, DO  buPROPion (WELLBUTRIN XL) 150 MG 24 hr tablet TAKE ONE TABLET BY MOUTH ONCE DAILY 11/09/15   Heath Lark, MD  cyclobenzaprine (FLEXERIL) 10 MG tablet TAKE ONE TABLET BY MOUTH TWICE DAILY AS NEEDED FOR MUSCLE SPASM 08/17/15   Katheren Shams, DO  dextromethorphan-guaiFENesin (MUCINEX DM) 30-600 MG per 12 hr tablet Take 1 tablet by mouth 2 (two) times daily as needed for cough. 11/03/14   Katheren Shams, DO  EQ LORATADINE 10 MG tablet TAKE ONE TABLET BY MOUTH ONCE DAILY 11/09/15   Katheren Shams, DO  fluconazole (DIFLUCAN) 150 MG tablet Take 1 tablet (150 mg total) by mouth once. 01/03/16   [place], DO  ibuprofen (ADVIL,MOTRIN) 600 MG tablet TAKE ONE TABLET BY MOUTH EVERY 8 HOURS AS NEEDED FOR  MODERATE  PAIN 11/26/15   Katheren Shams, DO  levothyroxine (SYNTHROID, LEVOTHROID) 175 MCG tablet Take 1 tablet (175 mcg total) by mouth daily before breakfast. 11/26/15   Katheren Shams, DO  losartan (COZAAR) 100 MG tablet TAKE ONE TABLET BY MOUTH  DAILY 08/17/15   Katheren Shams, DO  MELATONIN PO Take 1 tablet by mouth at bedtime as needed (sleep).    Historical Provider, MD  metoprolol tartrate (LOPRESSOR) 25 MG tablet TAKE ONE TABLET BY MOUTH TWICE DAILY 05/07/15   Katheren Shams, DO  mometasone-formoterol (DULERA) 200-5 MCG/ACT AERO Inhale 2 puffs into the lungs 2 (two) times daily. 05/07/15   Katheren Shams, DO  omeprazole (PRILOSEC) 40 MG capsule TAKE ONE CAPSULE BY MOUTH IN THE MORNING. TAKE AT LEAST FOUR HOURS AFTER TAKING LEVOTHYROXINE.  05/04/15   Katheren Shams, DO  ondansetron (ZOFRAN-ODT) 4 MG disintegrating tablet DISSOLVE ONE TABLET IN MOUTH EVERY 8 HOURS AS NEEDED FOR NAUSEA 09/20/15   Heath Lark, MD  predniSONE (DELTASONE) 20 MG tablet Take 2 tablets (40 mg total) by mouth daily. 11/11/15   Sharlett Iles, MD  tiotropium (SPIRIVA HANDIHALER) 18 MCG inhalation capsule Place 1 capsule (18 mcg total) into inhaler and inhale daily. 11/26/15   Katheren Shams, DO    Family History Family History  Problem Relation Age of Onset  . Heart disease Mother   . Heart disease Father   . Heart disease Sister     Social History Social History  Substance Use Topics  . Smoking status: Former Smoker    Packs/day:  2.00    Years: 20.00    Types: Cigarettes    Quit date: 09/17/2010  . Smokeless tobacco: Never Used  . Alcohol use Yes     Comment: Former drinker   Allergies   Lisinopril  Review of Systems Review of Systems  Constitutional: Positive for fever. Negative for appetite change.  Gastrointestinal: Positive for abdominal pain, diarrhea, nausea and vomiting. Negative for blood in stool.   Physical Exam Updated Vital Signs BP 104/89 (BP Location: Right Arm)   Pulse 76   Temp 98.7 F (37.1 C) (Oral)   Resp 18   SpO2 97%   Physical Exam  Constitutional: She is oriented to person, place, and time. She appears well-developed and well-nourished.  HENT:  Head: Normocephalic and atraumatic.  Eyes: Conjunctivae are normal. Pupils are equal, round, and reactive to light. Right eye exhibits no discharge. Left eye exhibits no discharge. No scleral icterus.  Neck: Normal range of motion. No JVD present. No tracheal deviation present.  Tracheostomy in place; site unremarkable  Oral mucosa dry   Cardiovascular: Normal rate, regular rhythm and normal heart sounds.   No murmur heard. Pulmonary/Chest: Effort normal and breath sounds normal. No stridor.  Abdominal: Soft. There is no tenderness.  Adequate bowel sounds    Neurological: She is alert and oriented to person, place, and time. Coordination normal.  Psychiatric: She has a normal mood and affect. Her behavior is normal. Judgment and thought content normal.  Nursing note and vitals reviewed.  ED Treatments / Results  Labs (all labs ordered are listed, but only abnormal results are displayed) Labs Reviewed  COMPREHENSIVE METABOLIC PANEL - Abnormal; Notable for the following:       Result Value   Creatinine, Ser 1.14 (*)    Calcium 8.7 (*)    GFR calc non Af Amer 52 (*)    All other components within normal limits  CBC - Abnormal; Notable for the following:    WBC 3.6 (*)    Hemoglobin 11.6 (*)    All other components within normal limits  LIPASE, BLOOD  URINALYSIS, ROUTINE W REFLEX MICROSCOPIC (NOT AT [facility])    EKG  EKG Interpretation None       Radiology No results found.  Procedures Procedures  DIAGNOSTIC STUDIES:  Oxygen Saturation is 97% on RA, normal by my interpretation.    COORDINATION OF CARE:  10:19 PM Discussed treatment plan with pt at bedside and pt agreed to plan.  Medications Ordered in ED Medications - No data to display  Initial Impression / Assessment and Plan / ED Course  I have reviewed the triage vital signs and the nursing notes.  Pertinent labs & imaging results that were available during my care of the patient were reviewed by me and considered in my medical decision making (see chart for details).  Clinical Course    Patient presents with vomiting and diarrhea for the past 1 week. IVF's, Zofran provided. Will observe for improvement and reassessment after lab results.  1:30 - Patient has vomiting after Zofran. Reglan ordered. IVF's complete. Labs reassuring without concerning acute abnormality. Will reassess after Reglan.   3:00 - patient feeling better with Reglan. Tolerating PO fluids. Feels improved enough to go home. Stable for discharge.     I personally performed the services described  in this documentation, which was scribed in my presence. The recorded information has been reviewed and is accurate.   Final Clinical Impressions(s) / ED Diagnoses   Final diagnoses:  None  New Prescriptions New Prescriptions   No medications on file     Charlann Lange, Hershal Coria 01/31/16 0306    Shanon Rosser, MD 01/31/16 [phone]

## 2016-01-30 NOTE — Patient Instructions (Signed)
Plan:  1. Patient to followup with dental medicine as scheduled for yearly recall .  2. The patient to keep dentures out if sore spots arise. Use salt water rinses as needed aid healing. Call if problems arise before next scheduled appointment.   Lenn Cal, DDS

## 2016-01-30 NOTE — ED Notes (Signed)
States nausea is better ice chips given no respiratory or acute distress noted alert and oriented x 3 no reaction to medication noted visitor at bedside.

## 2016-01-30 NOTE — ED Notes (Signed)
No respiratory or acute distress noted alert and oriented x 3 call light in reach visitor at bedside. 

## 2016-01-30 NOTE — Progress Notes (Signed)
01/30/2016  Patient:            Deborah [name] Date of Birth:  [birth year] MRN:                [id number]  BP 118/66   Pulse 72   Temp 98.1 F (36.7 C) (Oral)   Deborah [name] is a 60 year old female that presents for periodic oral exam and evaluation of upper and lower complete dentures.   Medical Hx Update:  Past Medical History:  Diagnosis Date  . Anemia   . Asthma   . Bronchitis   . COPD (chronic obstructive pulmonary disease) ([place])   . Diverticulosis 11/15/2012    noted on screening colonoscopy   . Esophageal stricture   . Former smoker 03/19/2011  . GERD (gastroesophageal reflux disease)   . Heart murmur    asymptomatic   . History of laryngectomy   . 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 Hypertension   . 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 Internal hemorrhoid 11/15/2012    small, noted on screening colonoscopy   . 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  . Leukocytopenia   . Nausea alone 07/28/2013  . Neck pain 01/21/2012  . Normal MRI 07/14/11   negative for mestasis   . Seizures ([place])    07/24/11 off Effexor w/o seizure  . Sepsis ([place]) 08/04/12  . Sinusitis, chronic 07/20/2011  . Sinusitis, chronic 07/20/2011   Bilateral maxillary, identified on MRI of head 07/14/11.    . 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[place])   . ALLERGIES/ADVERSE DRUG REACTIONS: Allergies  Allergen Reactions  . Lisinopril Cough   MEDICATIONS: Current Outpatient Prescriptions  Medication Sig Dispense Refill  . albuterol (PROVENTIL HFA) 108 (90 Base) MCG/ACT inhaler INHALE 2 PUFFS INTO THE LUNGS EVERY 4 HOURS AS NEEDED FOR WHEEZING OR SHORTNESS OF BREATH. 2 each 0  . benzonatate (TESSALON) 100 MG capsule Take 1 capsule (100 mg total) by mouth 3 (three) times daily as needed for cough. 30 capsule 0  . buPROPion (WELLBUTRIN XL) 150 MG 24 hr tablet TAKE ONE TABLET BY MOUTH ONCE DAILY 90 tablet 0  . cyclobenzaprine (FLEXERIL)  10 MG tablet TAKE ONE TABLET BY MOUTH TWICE DAILY AS NEEDED FOR MUSCLE SPASM 60 tablet 2  . dextromethorphan-guaiFENesin (MUCINEX DM) 30-600 MG per 12 hr tablet Take 1 tablet by mouth 2 (two) times daily as needed for cough. 30 tablet 2  . EQ LORATADINE 10 MG tablet TAKE ONE TABLET BY MOUTH ONCE DAILY 30 tablet 2  . fluconazole (DIFLUCAN) 150 MG tablet Take 1 tablet (150 mg total) by mouth once. 1 tablet 0  . ibuprofen (ADVIL,MOTRIN) 600 MG tablet TAKE ONE TABLET BY MOUTH EVERY 8 HOURS AS NEEDED FOR  MODERATE  PAIN 30 tablet 2  . levothyroxine (SYNTHROID, LEVOTHROID) 175 MCG tablet Take 1 tablet (175 mcg total) by mouth daily before breakfast. 60 tablet 0  . losartan (COZAAR) 100 MG tablet TAKE ONE TABLET BY MOUTH  DAILY 30 tablet 5  . MELATONIN PO Take 1 tablet by mouth at bedtime as needed (sleep).    . metoprolol tartrate (LOPRESSOR) 25 MG tablet TAKE ONE TABLET BY MOUTH TWICE DAILY 60 tablet 3  . mometasone-formoterol (DULERA) 200-5 MCG/ACT AERO Inhale 2 puffs into the lungs 2 (two) times daily. 1 Inhaler 6  . omeprazole (PRILOSEC) 40 MG capsule TAKE ONE CAPSULE BY MOUTH IN THE MORNING. TAKE  AT LEAST FOUR HOURS AFTER TAKING LEVOTHYROXINE. 30 capsule 3  . ondansetron (ZOFRAN-ODT) 4 MG disintegrating tablet DISSOLVE ONE TABLET IN MOUTH EVERY 8 HOURS AS NEEDED FOR NAUSEA 60 tablet 0  . predniSONE (DELTASONE) 20 MG tablet Take 2 tablets (40 mg total) by mouth daily. 10 tablet 0  . tiotropium (SPIRIVA HANDIHALER) 18 MCG inhalation capsule Place 1 capsule (18 mcg total) into inhaler and inhale daily. 30 capsule 2   No current facility-administered medications for this visit.     C/C: Patient presents for periodic oral examination and evaluation of upper and lower complete dentures.   HPI:   Patient had upper and  lower complete dentures inserted on 02/27/2011. Patient had reline procedures done on November 30, 2012. The patient was last seen in July 2016 for denture recall examination.  The patient  now presents for periodic oral examination and evaluation of the upper and lower complete dentures. Patient is not having any problems with her upper or lower complete dentures by report.  DENTAL EXAM: General: Patient is a well-developed, well-nourished female in no acute distress. Tracheostomy is present. Vitals: Blood pressure 118/66, pulse 72, temperature 98.1 F (36.7 C), temperature source Oral.  Extraoral Exam:  There is no palpable lymphadenopathy. There are no TMJ Symptoms. The tracheal stoma is still in place.  Intraoral  Exam: Patient has xerostomia. There are no soft tissue lesions or denture irritation noted.  There is atrophy of the edentulous alveolar ridges. Patient has thin, spiny ridges of the mandibular arch. Dentition: Patient is edentulous. Prosthodontic: The patient has upper and lower complete dentures. Dentures are stable and retentive.  Pressure indicating paste was applied and dentures were adjusted as needed.  Dentures were polished.  Occlusion:  The denture occlusion was evaluated. The patient has acceptable occlusion. The patient has a tendency to protrude to an End to End position.  Assessments: 1. Patient is edentulous. 2. Atrophy of the edentulous alveolar ridges 3. Upper and lower complete dentures are acceptable.   Plan:  1. Patient to followup with dental medicine as scheduled for yearly recall .  2. The patient to keep dentures out if sore spots arise. Use salt water rinses as needed aid healing. Call if problems arise before next scheduled appointment.   Lenn Cal, DDS

## 2016-01-30 NOTE — ED Triage Notes (Signed)
Pt c/o generalized abdominal pain, diarrhea, and nausea x 1 week. Pt sts she has taken everything possible OTC and nothing is working. Pt sts she can't eat without diarrhea or vomiting. A&Ox4 and ambulatory. Pt denies urinary symptoms. Denies dizziness, chest pain, SOB.

## 2016-01-31 LAB — URINALYSIS, ROUTINE W REFLEX MICROSCOPIC
BILIRUBIN URINE: NEGATIVE
Glucose, UA: NEGATIVE mg/dL
HGB URINE DIPSTICK: NEGATIVE
KETONES UR: NEGATIVE mg/dL
Leukocytes, UA: NEGATIVE
NITRITE: NEGATIVE
PROTEIN: NEGATIVE mg/dL
SPECIFIC GRAVITY, URINE: 1.018 (ref 1.005–1.030)
pH: 5.5 (ref 5.0–8.0)

## 2016-01-31 MED ORDER — METOCLOPRAMIDE HCL 10 MG PO TABS: 10.0000 mg | ORAL_TABLET | Freq: Four times a day (QID) | ORAL | 0 refills | Status: DC

## 2016-01-31 MED ORDER — PROMETHAZINE HCL 25 MG RE SUPP: 25.0000 mg | Freq: Four times a day (QID) | RECTAL | 0 refills | Status: DC | PRN

## 2016-01-31 MED ORDER — METOCLOPRAMIDE HCL 5 MG/ML IJ SOLN
10.0000 mg | Freq: Once | INTRAMUSCULAR | Status: AC
  Administered 2016-01-31: 10 mg via INTRAVENOUS
  Filled 2016-01-31: qty 2

## 2016-01-31 NOTE — ED Notes (Signed)
No respiratory or acute distress noted alert and oriented x 3 warm blanket given visitor at bedside call light in reach urine sent to lab.

## 2016-01-31 NOTE — ED Notes (Signed)
No respiratory or acute distress noted resting in bed with eyes closed call light in reach no reaction to medication noted visitor at bedside. 

## 2016-02-15 ENCOUNTER — Other Ambulatory Visit: Payer: Self-pay | Admitting: Obstetrics and Gynecology

## 2016-03-06 ENCOUNTER — Encounter: Payer: Self-pay | Admitting: Family Medicine

## 2016-03-06 ENCOUNTER — Ambulatory Visit (INDEPENDENT_AMBULATORY_CARE_PROVIDER_SITE_OTHER): Payer: Medicaid Other | Admitting: Family Medicine

## 2016-03-06 DIAGNOSIS — J441 Chronic obstructive pulmonary disease with (acute) exacerbation: Secondary | ICD-10-CM

## 2016-03-06 MED ORDER — DOXYCYCLINE HYCLATE 100 MG PO TABS: 100.0000 mg | ORAL_TABLET | Freq: Two times a day (BID) | ORAL | 0 refills | Status: DC

## 2016-03-06 MED ORDER — PREDNISONE 20 MG PO TABS: 40.0000 mg | ORAL_TABLET | Freq: Every day | ORAL | 0 refills | Status: DC

## 2016-03-06 NOTE — Patient Instructions (Signed)
Thank you so much for coming to visit today! We will treat your increased cough and shortness of breath as a COPD exacerbation. I have sent a prescription for Doxycycline 100mg  twice daily to the pharmacy for you to take for 10 days. I have sent a steroid to the pharmacy for you to take two tablets once a day for the next 5 days. Please return if your symptoms worsen or are not improved over the next 1-2 weeks.  Dr. Gerlean Ren

## 2016-03-10 NOTE — Progress Notes (Signed)
Subjective:     Patient ID: Deborah [name], female   DOB: 04-Apr-1956, 60 y.o.   MRN: [id number]  HPI Mrs. Deborah [name] is a 60yo female presenting today for shortness of breath and cough. - NOTE history of trachelectomy. Refusing to whisper, which she does at baseline, today. Refusing to write or type out responses to questions. Entire history obtained through yes/no questions (nodding/shaking head) or mime on Deborah [name]'s part. - Reports chest cold, shortness of breath, and fever x4 days - Notes fever of 102F yesterday. Improved today without Tylenol. - Cough is productive, although she is having a hard time coughing up everything. - History of COPD noted - Denies current shortness of breath, chest pain - Former Smoker  Review of Systems Per HPI.    Objective:   Physical Exam  Constitutional: She appears well-developed and well-nourished. No distress.  HENT:  Head: Normocephalic and atraumatic.  Right Ear: External ear normal.  Left Ear: External ear normal.  Cardiovascular: Normal rate and regular rhythm.   No murmur heard. Pulmonary/Chest: Effort normal. No respiratory distress. She has no wheezes.  Musculoskeletal: She exhibits no edema.  Psychiatric: She has a normal mood and affect. Her behavior is normal.      Assessment and Plan:     COPD exacerbation - Suspect increased productive cough due to COPD exacerbation.  - Afebrile today. O2 saturation 95% - Prednisone burst and Doxycycline course prescribed - Return precautions given - Return if symptoms worsen or fail to resolve

## 2016-03-10 NOTE — Assessment & Plan Note (Addendum)
-   Suspect increased productive cough due to COPD exacerbation.  - Afebrile today. O2 saturation 95% - Prednisone burst and Doxycycline course prescribed - Return precautions given - Return if symptoms worsen or fail to resolve

## 2016-03-12 ENCOUNTER — Telehealth: Payer: Self-pay | Admitting: Obstetrics and Gynecology

## 2016-03-12 NOTE — Telephone Encounter (Signed)
Patient needs her PCP to write a letter stating she can not be without air conditioning or humidifier. Please let patient know when she may pick this up.

## 2016-03-13 NOTE — Telephone Encounter (Signed)
Letter up front for pick up. 

## 2016-03-14 NOTE — Telephone Encounter (Signed)
Patient is aware that letter is ready for pick up. Chord Takahashi,CMA  

## 2016-03-17 ENCOUNTER — Ambulatory Visit (INDEPENDENT_AMBULATORY_CARE_PROVIDER_SITE_OTHER): Payer: Medicaid Other

## 2016-03-17 DIAGNOSIS — J441 Chronic obstructive pulmonary disease with (acute) exacerbation: Secondary | ICD-10-CM | POA: Diagnosis present

## 2016-03-17 DIAGNOSIS — Z23 Encounter for immunization: Secondary | ICD-10-CM | POA: Diagnosis not present

## 2016-04-04 ENCOUNTER — Other Ambulatory Visit: Payer: Self-pay | Admitting: Obstetrics and Gynecology

## 2016-04-08 ENCOUNTER — Ambulatory Visit (INDEPENDENT_AMBULATORY_CARE_PROVIDER_SITE_OTHER): Payer: Medicaid Other | Admitting: Obstetrics and Gynecology

## 2016-04-08 ENCOUNTER — Encounter: Payer: Self-pay | Admitting: Obstetrics and Gynecology

## 2016-04-08 VITALS — BP 148/89 | HR 81 | Temp 98.0°F | Ht 67.0 in | Wt 204.2 lb

## 2016-04-08 DIAGNOSIS — J441 Chronic obstructive pulmonary disease with (acute) exacerbation: Secondary | ICD-10-CM | POA: Diagnosis not present

## 2016-04-08 DIAGNOSIS — M1711 Unilateral primary osteoarthritis, right knee: Secondary | ICD-10-CM | POA: Diagnosis not present

## 2016-04-08 DIAGNOSIS — M1712 Unilateral primary osteoarthritis, left knee: Secondary | ICD-10-CM | POA: Diagnosis not present

## 2016-04-08 MED ORDER — METHYLPREDNISOLONE ACETATE 40 MG/ML IJ SUSP
40.0000 mg | Freq: Once | INTRAMUSCULAR | Status: AC
  Administered 2016-04-08: 40 mg via INTRAMUSCULAR

## 2016-04-08 NOTE — Patient Instructions (Signed)

## 2016-04-08 NOTE — Progress Notes (Signed)
Subjective:    Patient ID: Deborah [name], female    DOB: October 12, 1955, 61 y.o.   MRN: [id number]  Patient is a 60 year old female who presents with chronic osteoarthritis of both the right and left knees. She reports she takes ibuprofen regularly for this pain but recently has not been cutting it. She is taken ibuprofen every other day for the past week and notes greater limited range of motion and pain with walking. She has had injections in these knees probably 3 times before. Last time was approximately 10 months ago.   Knee Pain   The incident occurred more than 1 week ago. The incident occurred at home. There was no injury mechanism. The pain is present in the left knee and right knee. The quality of the pain is described as aching. The pain is at a severity of 10/10. The pain is severe. The pain has been constant since onset. Associated symptoms include an inability to bear weight and a loss of motion. Pertinent negatives include no loss of sensation, numbness or tingling. The symptoms are aggravated by movement, palpation and weight bearing. She has tried NSAIDs and non-weight bearing for the symptoms. The treatment provided moderate relief.      Review of Systems  Constitutional: Positive for activity change. Negative for chills, fatigue and fever.  Respiratory: Negative for cough, chest tightness and shortness of breath.   Cardiovascular: Negative for chest pain and leg swelling.  Gastrointestinal: Negative for abdominal pain and nausea.  Endocrine: Negative for cold intolerance.  Genitourinary: Negative for difficulty urinating and dysuria.  Musculoskeletal: Positive for arthralgias, gait problem and joint swelling.  Skin: Negative for color change and rash.  Neurological: Negative for dizziness, tingling and numbness.       Objective:   Physical Exam  Constitutional: She is oriented to person, place, and time. She appears well-developed and well-nourished.  HENT:  Head:  Normocephalic and atraumatic.  Neck:  Trach in place  Cardiovascular: Normal rate, regular rhythm and normal heart sounds.   Pulmonary/Chest: Effort normal and breath sounds normal. No respiratory distress.  Abdominal: Soft. Bowel sounds are normal. She exhibits no distension. There is no tenderness. There is no rebound.  Musculoskeletal:  Patient with mild swelling of the right knee, tenderness to palpation over the medial joint line. Left knee with minimal swelling but tenderness to palpation over the medial joint line. Crepitus bilaterally with flexion and extension. Mild limited range of motion with flexion and extension at the knee.  Neurological: She is alert and oriented to person, place, and time.  Skin: Skin is warm and dry.  Psychiatric: She has a normal mood and affect. Her behavior is normal.     Procedure:  Indication: Bilateral knee osteoarthritis  Consent: Verbal and written consent was obtained.  Procedure: The left knee medial aspect was cleaned with alcohol pad. The joint line was palpated and a needle was placed through the skin into the joint space. 1 mL of Depo-Medrol (40 mg) and 3 mL of 1% lidocaine was injected. A Band-Aid was placed on the injection site. The right knee was then prepped with alcohol pad on the medial aspect. Joint space was palpated and a needle was inserted to the hub. A similar mixture of 1 mL of Depo-Medrol (40 mg) and 3 mL's of 1% lidocaine was injected. Band-Aid was placed on the injection site. Patient tolerated the procedure well.      Assessment & Plan:  Primary osteoarthritis of right knee  Primary  osteoarthritis of left knee  Patient can continue ibuprofen as needed. Bilateral knee injections today with 40 mg of Depo-Medrol and at 3-1 solution with lidocaine without epinephrine. Patient tolerated procedure well. Patient to return as needed.

## 2016-04-08 NOTE — Addendum Note (Signed)
Addended by: Katharina Caper, APRIL D on: 04/08/2016 11:49 AM   Modules accepted: Orders

## 2016-05-09 ENCOUNTER — Other Ambulatory Visit: Payer: Self-pay | Admitting: Obstetrics and Gynecology

## 2016-05-09 ENCOUNTER — Other Ambulatory Visit: Payer: Self-pay | Admitting: Hematology and Oncology

## 2016-05-21 ENCOUNTER — Ambulatory Visit (INDEPENDENT_AMBULATORY_CARE_PROVIDER_SITE_OTHER): Payer: Medicaid Other | Admitting: Family Medicine

## 2016-05-21 ENCOUNTER — Encounter: Payer: Self-pay | Admitting: Family Medicine

## 2016-05-21 VITALS — BP 132/84 | HR 79 | Temp 98.1°F | Ht 67.0 in | Wt 206.0 lb

## 2016-05-21 DIAGNOSIS — M5441 Lumbago with sciatica, right side: Secondary | ICD-10-CM

## 2016-05-21 DIAGNOSIS — M545 Low back pain, unspecified: Secondary | ICD-10-CM | POA: Insufficient documentation

## 2016-05-21 DIAGNOSIS — J441 Chronic obstructive pulmonary disease with (acute) exacerbation: Secondary | ICD-10-CM | POA: Diagnosis not present

## 2016-05-21 DIAGNOSIS — Z8589 Personal history of malignant neoplasm of other organs and systems: Secondary | ICD-10-CM | POA: Diagnosis not present

## 2016-05-21 DIAGNOSIS — Z90710 Acquired absence of both cervix and uterus: Secondary | ICD-10-CM | POA: Diagnosis not present

## 2016-05-21 DIAGNOSIS — I10 Essential (primary) hypertension: Secondary | ICD-10-CM | POA: Diagnosis not present

## 2016-05-21 MED ORDER — GABAPENTIN 300 MG PO CAPS: 300.0000 mg | ORAL_CAPSULE | Freq: Every day | ORAL | 1 refills | Status: DC

## 2016-05-21 NOTE — Progress Notes (Signed)
   Subjective:   Deborah [name] is a 60 y.o. 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 with ENT She reports she's been having trouble with her trach recently and feels as though he needs to be stretched again She would like a referral back to see her previous ENT doctor  HTN Patient reports she is taking her medications as prescribed She thinks that her blood pressure may be elevated due to pain  Patient also reports that she has had a rash on and off underneath her breasts for many weeks She denies fevers, chills, spreading of rash  Review of Systems:  Per HPI.   Social History: Former smoker 40 pack years  Objective:  BP 132/84   Pulse 79   Temp 98.1 F (36.7 C) (Oral)   Ht 5\' 7"  (1.702 m)   Wt 206 lb (93.4 kg)   BMI 32.26 kg/m   Gen:  60 y.o. female in NAD HEENT: NCAT, MMM, anicteric sclerae, trach clean and dry CV: RRR, no MRG Resp: Non-labored, CTAB, no wheezes noted Ext: WWP, no edema MSK: Strength 4+ out of 5 in bilateral hip flexion, knee extension, knee flexion, dorsiflexion, plantar flexion with limited patient effort. Patient reports pain in right leg with all resisted strength testing. No midline tenderness to palpation over cervical, thoracic, lumbar spine. Moderately tender to palpation over bilateral paraspinal muscles of lumbar spine. Negative straight leg raise bilaterally Neuro: Alert and oriented, speech normal, sensation grossly intact to light touch over bilateral lower extremities, DTRs of patella and Achilles  tendons 1+ bilaterally, no clonus      Assessment & Plan:     Deborah [name] is a 60 y.o. female here for   Low back pain No red flags or midline tenderness Likely sciatic pain down right leg Advised on home exercise plan for lower back Start gabapentin 300 mg daily at bedtime for radicular pain Can take her Flexeril and ibuprofen when necessary Follow-up in 4 weeks if not improving Can consider physical therapy at a later date  HTN (hypertension) Initially elevated but improved on manual recheck Needs to follow-up with her primary care doctor for chronic disease management  Status post trachelectomy Re- Referral placed for patient to see her ENT   Advised patient to make a follow-up appointment for her other concerns   Virginia Crews, MD MPH PGY-3,  [facility] 05/21/2016  12:00 PM

## 2016-05-21 NOTE — Assessment & Plan Note (Signed)
Re- Referral placed for patient to see her ENT

## 2016-05-21 NOTE — Assessment & Plan Note (Signed)
No red flags or midline tenderness Likely sciatic pain down right leg Advised on home exercise plan for lower back Start gabapentin 300 mg daily at bedtime for radicular pain Can take her Flexeril and ibuprofen when necessary Follow-up in 4 weeks if not improving Can consider physical therapy at a later date

## 2016-05-21 NOTE — Patient Instructions (Addendum)
I think that your leg pain is caused by sciatica from your back. Start taking gabapentin 300 mg daily at bedtime. Start doing exercises below. You can continue ibuprofen and Flexeril as needed.   Please make another appointment for your rash. If you have any fevers or pus drainage this is a reason to be seen urgently.  I put in a referral for your ENT doctor.  Take care, Dr. B   Back Exercises Introduction If you have pain in your back, do these exercises 2-3 times each day or as told by your doctor. When the pain goes away, do the exercises once each day, but repeat the steps more times for each exercise (do more repetitions). If you do not have pain in your back, do these exercises once each day or as told by your doctor. Exercises Single Knee to Chest  Do these steps 3-5 times in a row for each leg: 1. Lie on your back on a firm bed or the floor with your legs stretched out. 2. Bring one knee to your chest. 3. Hold your knee to your chest by grabbing your knee or thigh. 4. Pull on your knee until you feel a gentle stretch in your lower back. 5. Keep doing the stretch for 10-30 seconds. 6. Slowly let go of your leg and straighten it. Pelvic Tilt  Do these steps 5-10 times in a row: 1. Lie on your back on a firm bed or the floor with your legs stretched out. 2. Bend your knees so they point up to the ceiling. Your feet should be flat on the floor. 3. Tighten your lower belly (abdomen) muscles to press your lower back against the floor. This will make your tailbone point up to the ceiling instead of pointing down to your feet or the floor. 4. Stay in this position for 5-10 seconds while you gently tighten your muscles and breathe evenly. Cat-Cow  Do these steps until your lower back bends more easily: 1. Get on your hands and knees on a firm surface. Keep your hands under your shoulders, and keep your knees under your hips. You may put padding under your knees. 2. Let your head hang  down, and make your tailbone point down to the floor so your lower back is round like the back of a cat. 3. Stay in this position for 5 seconds. 4. Slowly lift your head and make your tailbone point up to the ceiling so your back hangs low (sags) like the back of a cow. 5. Stay in this position for 5 seconds. Press-Ups  Do these steps 5-10 times in a row: 1. Lie on your belly (face-down) on the floor. 2. Place your hands near your head, about shoulder-width apart. 3. While you keep your back relaxed and keep your hips on the floor, slowly straighten your arms to raise the top half of your body and lift your shoulders. Do not use your back muscles. To make yourself more comfortable, you may change where you place your hands. 4. Stay in this position for 5 seconds. 5. Slowly return to lying flat on the floor. Bridges  Do these steps 10 times in a row: 1. Lie on your back on a firm surface. 2. Bend your knees so they point up to the ceiling. Your feet should be flat on the floor. 3. Tighten your butt muscles and lift your butt off of the floor until your waist is almost as high as your knees. If you do  not feel the muscles working in your butt and the back of your thighs, slide your feet 1-2 inches farther away from your butt. 4. Stay in this position for 3-5 seconds. 5. Slowly lower your butt to the floor, and let your butt muscles relax. If this exercise is too easy, try doing it with your arms crossed over your chest. Belly Crunches  Do these steps 5-10 times in a row: 1. Lie on your back on a firm bed or the floor with your legs stretched out. 2. Bend your knees so they point up to the ceiling. Your feet should be flat on the floor. 3. Cross your arms over your chest. 4. Tip your chin a little bit toward your chest but do not bend your neck. 5. Tighten your belly muscles and slowly raise your chest just enough to lift your shoulder blades a tiny bit off of the floor. 6. Slowly lower your  chest and your head to the floor. Back Lifts  Do these steps 5-10 times in a row: 1. Lie on your belly (face-down) with your arms at your sides, and rest your forehead on the floor. 2. Tighten the muscles in your legs and your butt. 3. Slowly lift your chest off of the floor while you keep your hips on the floor. Keep the back of your head in line with the curve in your back. Look at the floor while you do this. 4. Stay in this position for 3-5 seconds. 5. Slowly lower your chest and your face to the floor. Contact a doctor if:  Your back pain gets a lot worse when you do an exercise.  Your back pain does not lessen 2 hours after you exercise. If you have any of these problems, stop doing the exercises. Do not do them again unless your doctor says it is okay. Get help right away if:  You have sudden, very bad back pain. If this happens, stop doing the exercises. Do not do them again unless your doctor says it is okay. This information is not intended to replace advice given to you by your health care provider. Make sure you discuss any questions you have with your health care provider. Document Released: 07/05/2010 Document Revised: 11/08/2015 Document Reviewed: 07/27/2014  2017 Elsevier

## 2016-05-21 NOTE — Assessment & Plan Note (Signed)
Initially elevated but improved on manual recheck Needs to follow-up with her primary care doctor for chronic disease management

## 2016-05-21 NOTE — Assessment & Plan Note (Signed)
>>  ASSESSMENT AND PLAN FOR STATUS POST TRACHELECTOMY WRITTEN ON 05/21/2016 12:00 PM BY MYRLA JON HERO, MD  Re- Referral placed for patient to see her ENT

## 2016-05-23 ENCOUNTER — Other Ambulatory Visit: Payer: Self-pay | Admitting: *Deleted

## 2016-05-26 MED ORDER — IBUPROFEN 600 MG PO TABS: ORAL_TABLET | ORAL | 1 refills | Status: DC

## 2016-06-05 ENCOUNTER — Ambulatory Visit (INDEPENDENT_AMBULATORY_CARE_PROVIDER_SITE_OTHER): Payer: Medicaid Other | Admitting: Obstetrics and Gynecology

## 2016-06-05 ENCOUNTER — Encounter: Payer: Self-pay | Admitting: Obstetrics and Gynecology

## 2016-06-05 VITALS — BP 130/76 | HR 69 | Temp 98.2°F | Ht 67.0 in | Wt 208.2 lb

## 2016-06-05 DIAGNOSIS — M722 Plantar fascial fibromatosis: Secondary | ICD-10-CM

## 2016-06-05 DIAGNOSIS — B372 Candidiasis of skin and nail: Secondary | ICD-10-CM

## 2016-06-05 DIAGNOSIS — R1310 Dysphagia, unspecified: Secondary | ICD-10-CM | POA: Diagnosis not present

## 2016-06-05 DIAGNOSIS — M5441 Lumbago with sciatica, right side: Secondary | ICD-10-CM | POA: Diagnosis not present

## 2016-06-05 DIAGNOSIS — I10 Essential (primary) hypertension: Secondary | ICD-10-CM

## 2016-06-05 DIAGNOSIS — J441 Chronic obstructive pulmonary disease with (acute) exacerbation: Secondary | ICD-10-CM | POA: Diagnosis present

## 2016-06-05 MED ORDER — NYSTATIN 100000 UNIT/GM EX CREA: 1.0000 "application " | TOPICAL_CREAM | Freq: Two times a day (BID) | CUTANEOUS | 2 refills | Status: DC

## 2016-06-05 NOTE — Patient Instructions (Signed)
Cream and powder sent in for rash. Try and keep area as dry as possible under breast BP looks good today Follow-up with ENT doctor   Intertrigo Introduction Intertrigo is skin irritation (inflammation) that happens in warm, moist areas of the body. The irritation can cause a rash and make skin raw and itchy. The rash is usually pink or red. It happens mostly between folds of skin or where skin rubs together, such as:  Toes.  Armpits.  Groin.  Belly.  Breasts.  Buttocks. This condition is not passed from person to person (is not contagious). Follow these instructions at home:  Keep the affected area clean and dry.  Do not scratch your skin.  Stay cool as much as possible. Use an air conditioner or fan, if you can.  Apply over-the-counter and prescription medicines only as told by your doctor.  If you were prescribed an antibiotic medicine, use it as told by your doctor. Do not stop using the antibiotic even if your condition starts to get better.  Keep all follow-up visits as told by your doctor. This is important. How is this prevented?  Stay at a healthy weight.  Keep your feet dry. This is very important if you have diabetes. Wear cotton or wool socks.  Take care of and protect the skin in your groin and butt area as told by your doctor.  Do not wear tight clothes. Wear clothes that:  Are loose.  Take away moisture from your body.  Are made of cotton.  Wear a bra that gives good support, if needed.  Shower and dry yourself fully after being active.  Keep your blood sugar under control if you have diabetes. Contact a doctor if:  Your symptoms do not get better with treatment.  Your symptoms get worse or they spread.  You notice more redness and warmth.  You have a fever. This information is not intended to replace advice given to you by your health care provider. Make sure you discuss any questions you have with your health care provider. Document  Released: 07/05/2010 Document Revised: 11/08/2015 Document Reviewed: 12/04/2014  2017 Elsevier   Plantar Fasciitis  Plantar fasciitis is a painful foot condition that affects the heel. It occurs when the band of tissue that connects the toes to the heel bone (plantar fascia) becomes irritated. This can happen after exercising too much or doing other repetitive activities (overuse injury). The pain from plantar fasciitis can range from mild irritation to severe pain that makes it difficult for you to walk or move. The pain is usually worse in the morning or after you have been sitting or lying down for a while. CAUSES This condition may be caused by:  Standing for long periods of time.  Wearing shoes that do not fit.  Doing high-impact activities, including running, aerobics, and ballet.  Being overweight.  Having an abnormal way of walking (gait).  Having tight calf muscles.  Having high arches in your feet.  Starting a new athletic activity. SYMPTOMS The main symptom of this condition is heel pain. Other symptoms include:  Pain that gets worse after activity or exercise.  Pain that is worse in the morning or after resting.  Pain that goes away after you walk for a few minutes. DIAGNOSIS This condition may be diagnosed based on your signs and symptoms. Your health care provider will also do a physical exam to check for:  A tender area on the bottom of your foot.  A high arch  in your foot.  Pain when you move your foot.  Difficulty moving your foot. You may also need to have imaging studies to confirm the diagnosis. These can include:  X-rays.  Ultrasound.  MRI. TREATMENT  Treatment for plantar fasciitis depends on the severity of the condition. Your treatment may include:  Rest, ice, and over-the-counter pain medicines to manage your pain.  Exercises to stretch your calves and your plantar fascia.  A splint that holds your foot in a stretched, upward position  while you sleep (night splint).  Physical therapy to relieve symptoms and prevent problems in the future.  Cortisone injections to relieve severe pain.  Extracorporeal shock wave therapy (ESWT) to stimulate damaged plantar fascia with electrical impulses. It is often used as a last resort before surgery.  Surgery, if other treatments have not worked after 12 months. HOME CARE INSTRUCTIONS  Take medicines only as directed by your health care provider.  Avoid activities that cause pain.  Roll the bottom of your foot over a bag of ice or a bottle of cold water. Do this for 20 minutes, 3-4 times a day.  Perform simple stretches as directed by your health care provider.  Try wearing athletic shoes with air-sole or gel-sole cushions or soft shoe inserts.  Wear a night splint while sleeping, if directed by your health care provider.  Keep all follow-up appointments with your health care provider. PREVENTION   Do not perform exercises or activities that cause heel pain.  Consider finding low-impact activities if you continue to have problems.  Lose weight if you need to. The best way to prevent plantar fasciitis is to avoid the activities that aggravate your plantar fascia. SEEK MEDICAL CARE IF:  Your symptoms do not go away after treatment with home care measures.  Your pain gets worse.  Your pain affects your ability to move or do your daily activities. This information is not intended to replace advice given to you by your health care provider. Make sure you discuss any questions you have with your health care provider. Document Released: 02/25/2001 Document Revised: 09/24/2015 Document Reviewed: 04/12/2014 Elsevier Interactive Patient Education  2017 [employer].

## 2016-06-05 NOTE — Progress Notes (Signed)
     Subjective: Chief Complaint  Patient presents with  . Hypertension     HPI: Deborah [name] is a 60 y.o. presenting to clinic today to discuss the following:  #Hypertension Blood pressure at home: elevated Blood pressure today: wnl Taking Meds: yes Side effects: none ROS: Denies headache, dizziness, visual changes, nausea, vomiting, chest pain, abdominal pain or shortness of breath.  #Leg pain Recently seen in clinic for leg pain Diagnosed with sciatica Pain worse with movement Has been that shoots up her leg first thing in the morning and when she walks on cement floor at work Also has pain that goes from her back down her right side bought new shoes and insoles still not helping Taking gabapentin but not having relief  #Rash Under breast For about a month Cant wear a bra because irritates it Not itching Rash can be red to brown in color Burns when it is irritated patient states she sweats a lot under her breast Used some over the counter cream that hasnt been working  #Dysphagia pmh significant for dysphasia that requires patient to get dilation Food is getting stuck in throat Crushing medicines Feels like she needs her throat dilated again Fluids go down fine Has adjusted diet to softer foods Hasnt been seen in 4 years by ENT but has appointment at the beginning of the year   ROS noted in HPI.  Past Medical, Surgical, Social, and Family History Reviewed & Updated per EMR.  History  Smoking Status  . Former Smoker  . Packs/day: 2.00  . Years: 20.00  . Types: Cigarettes  . Quit date: 09/17/2010  Smokeless Tobacco  . Never Used     Objective: BP 130/76   Pulse 69   Temp 98.2 F (36.8 C) (Oral)   Ht 5\' 7"  (1.702 m)   Wt 208 lb 3.2 oz (94.4 kg)   SpO2 95%   BMI 32.61 kg/m  Vitals and nursing notes reviewed  Physical Exam Gen:  60 y.o. female in NAD HEENT: trach clean and dry, MMM Breasts: bilateral breasts normal in appearance. No  erythema, deformity, or nipple discharge. No palpable abnormal masses. No axillary lymphadenopathy. Hyperpigmented macular rash noticed under bilateral breast without erythema or skin breakdown.  CV: RRR, no MRG Resp: Non-labored, CTAB, no wheezes noted Ext: WWP, no edema MSK: Strength 4+ out of 5 in bilateral hip flexion, knee extension, knee flexion, dorsiflexion, plantar flexion with limited patient effort. Patient reports pain in right leg with all resisted strength testing. No midline tenderness to palpation over cervical, thoracic, lumbar spine. Moderately tender to palpation over bilateral paraspinal muscles of lumbar spine. Negative straight leg raise bilaterally. Tenderness to palpation of right plantar midline surface of foot over plantar fascia.  Neuro: Alert and oriented, speech normal, sensation grossly intact to light touch over bilateral lower extremities, DTRs of patella and Achilles tendons 1+ bilaterally  No results found for this or any previous visit (from the past 72 hour(s)).  Assessment/Plan: Please see problem based Assessment and Plan    Meds ordered this encounter  Medications  . nystatin cream (MYCOSTATIN)    Sig: Apply 1 application topically 2 (two) times daily. Until rash resolves.    Dispense:  30 g    Refill:  [place], DO 06/05/2016, 2:24 PM PGY-3, [place]

## 2016-06-06 DIAGNOSIS — M722 Plantar fascial fibromatosis: Secondary | ICD-10-CM | POA: Insufficient documentation

## 2016-06-06 DIAGNOSIS — B372 Candidiasis of skin and nail: Secondary | ICD-10-CM | POA: Insufficient documentation

## 2016-06-06 HISTORY — DX: Plantar fascial fibromatosis: M72.2

## 2016-06-06 NOTE — Assessment & Plan Note (Signed)
>>  ASSESSMENT AND PLAN FOR DYSPHAGIA WRITTEN ON 06/06/2016  5:10 PM BY Belynda Pagaduan Y, DO  Continues to endorse dysphagia symptoms to solids and tolerated liquids fine. Chronic history of dysphasia requiring dilation. Will likely need dilation soon. Has appointment with ENT physician at the beginning of the next month. Will continue to follow patient. She has plan in place and has adjusted diet.

## 2016-06-06 NOTE — Assessment & Plan Note (Addendum)
Rash under breast consistent with candidal intertrigo. Rx for nystatin cream. Discussed that patient needs to keep breast as dry as possible to prevent recurrence of rash.

## 2016-06-06 NOTE — Assessment & Plan Note (Signed)
Continues to have right-sided sciatica from low back pain. No red flags on exam. Continue gabapentin for radicular pain. Patient also continue Flexeril and ibuprofen when necessary. Discussed with patient to follow-up after the holidays if not resolved. Will likely need physical therapy referral at that time. Encouraged patient to continue with home exercises to help with low back pain. Handout given.

## 2016-06-06 NOTE — Assessment & Plan Note (Signed)
Blood pressure well controlled currently. Goal BP <140/90. Most likely elevated at recent visit due to pain. Will not adjust medications at this time. Patient to continue monitoring blood pressures at home.

## 2016-06-06 NOTE — Assessment & Plan Note (Signed)
Right-sided foot pain when stepping out of bed first thing in the morning and on hard surfaces consistent with plantar fasciitis. Physical exam consistent as well. Discussed conservative measures with patient including cold bottle massaging of area. She is to use her new shoes and insoles to help with pain. Can consider plantar fascial injection if symptoms not improved.

## 2016-06-06 NOTE — Assessment & Plan Note (Signed)
Continues to endorse dysphagia symptoms to solids and tolerated liquids fine. Chronic history of dysphasia requiring dilation. Will likely need dilation soon. Has appointment with ENT physician at the beginning of the next month. Will continue to follow patient. She has plan in place and has adjusted diet.

## 2016-06-11 ENCOUNTER — Other Ambulatory Visit: Payer: Self-pay | Admitting: Hematology and Oncology

## 2016-06-11 ENCOUNTER — Other Ambulatory Visit: Payer: Self-pay | Admitting: Obstetrics and Gynecology

## 2016-06-11 DIAGNOSIS — E039 Hypothyroidism, unspecified: Secondary | ICD-10-CM

## 2016-06-20 ENCOUNTER — Other Ambulatory Visit: Payer: Self-pay | Admitting: Otolaryngology

## 2016-06-20 DIAGNOSIS — R1314 Dysphagia, pharyngoesophageal phase: Secondary | ICD-10-CM

## 2016-06-24 ENCOUNTER — Ambulatory Visit
Admission: RE | Admit: 2016-06-24 | Discharge: 2016-06-24 | Disposition: A | Discharge status: Home / Self Care | Payer: Medicaid Other | Source: Ambulatory Visit | Attending: Otolaryngology | Admitting: Otolaryngology

## 2016-06-24 ENCOUNTER — Ambulatory Visit: Payer: Self-pay | Admitting: Otolaryngology

## 2016-06-24 DIAGNOSIS — R1314 Dysphagia, pharyngoesophageal phase: Secondary | ICD-10-CM

## 2016-06-24 NOTE — H&P (Signed)
Deborah [name] is an 61 y.o. female.   Chief Complaint: difficulty swallowing HPI: Almost 7 years posttreatment. About 3 weeks ago, she started having trouble swallowing again as she had a few years ago.      Past Medical History:  Diagnosis Date  . Anemia   . Asthma   . Bronchitis   . COPD (chronic obstructive pulmonary disease) ([place])   . Diverticulosis 11/15/2012    noted on screening colonoscopy   . Esophageal stricture   . Former smoker 03/19/2011  . GERD (gastroesophageal reflux disease)   . Heart murmur    asymptomatic   . History of laryngectomy   . 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 Hypertension   . 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 Internal hemorrhoid 11/15/2012    small, noted on screening colonoscopy   . 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  . Leukocytopenia   . Nausea alone 07/28/2013  . Neck pain 01/21/2012  . Normal MRI 07/14/11   negative for mestasis   . Seizures ([place])    07/24/11 off Effexor w/o seizure  . Sepsis ([place]) 08/04/12  . Sinusitis, chronic 07/20/2011  . Sinusitis, chronic 07/20/2011   Bilateral maxillary, identified on MRI of head 07/14/11.    . Tracheostomy dependent [facility])     Past Surgical History:  Procedure Laterality Date  . BREAST SURGERY    . COLONOSCOPY N/A 11/15/2012   Procedure: COLONOSCOPY;  Surgeon: Lafayette Dragon, MD;  Location: [facility];  Service: Endoscopy;  Laterality: N/A;  . ESOPHAGOSCOPY  06/21/2012   Procedure: ESOPHAGOSCOPY;  Surgeon: [name], MD;  Location: [place];  Service: ENT;  Laterality: N/A;  . ESOPHAGOSCOPY WITH DILITATION N/A 09/21/2014   Procedure: ESOPHAGOSCOPY WITH DILITATION;  Surgeon: [name], MD;  Location: [place];  Service: ENT;  Laterality: N/A;  . FOREIGN BODY REMOVAL BRONCHIAL  10/02/2011   Procedure: REMOVAL FOREIGN BODY BRONCHIAL;  Surgeon: Ruby Cola, MD;  Location: [place];  Service: ENT;   Laterality: N/A;  . LARYNGECTOMY    . PORTACATH PLACEMENT  09/17/10   Tip in cavoatrial junction  . TRACHEAL DILITATION  07/16/2011   Procedure: TRACHEAL DILITATION;  Surgeon: Beckie Salts, MD;  Location: [place];  Service: ENT;  Laterality: N/A;  dilation of tracheal stoma and replacement of stoma tube  . TUBAL LIGATION  1982    Family History  Problem Relation Age of Onset  . Heart disease Mother   . Heart disease Father   . Heart disease Sister    Social History:  reports that she quit smoking about 5 years ago. Her smoking use included Cigarettes. She has a 40.00 pack-year smoking history. She has never used smokeless tobacco. She reports that she drinks alcohol. She reports that she does not use drugs.  Allergies:  Allergies  Allergen Reactions  . Lisinopril Cough     (Not in a hospital admission)  No results found for this or any previous visit (from the past 48 hour(s)). Dg Esophagus  Result Date: 06/24/2016 CLINICAL DATA:  Dysphagia, history of laryngeal carcinoma EXAM: ESOPHOGRAM/BARIUM SWALLOW TECHNIQUE: Single contrast examination was performed using  thin barium. FLUOROSCOPY TIME:  Fluoroscopy Time:  1 minutes 24 seconds Radiation Exposure Index (if provided by the fluoroscopic device): 307 mGy Number of Acquired Spot Images: 0 COMPARISON:  Barium swallow of 09/13/2014 FINDINGS: A single contrast barium swallow was performed  in this patient with tracheostomy. The swallowing mechanism is unremarkable. Compared to the prior study from 2016 the apparently extrinsic indentation upon the lower cervical esophagus from the right of midline, just above the tracheostomy is unchanged. This could be due to scarring, being stable over the intervening 2 years. A submucosal lesion however would be difficult to exclude as would indentation by thyroid nodule from the right. No aspiration or penetration is seen. Esophageal peristalsis is otherwise normal. There is a small hiatal hernia present  and mild gastroesophageal reflux is demonstrated. A barium pill was given at the end the study which passed into the stomach without significant delay. IMPRESSION: 1. As noted on the barium swallow from 2016, there is extrinsic indentation upon the lower cervical esophagus from the right of midline just above the tracheostomy. This has not changed over the intervening 2 years and most likely is therefore benign possibly related to postop scarring or benign submucosal lesion. 2. Small hiatal hernia with mild gastroesophageal reflux. 3. Barium pill passes into the stomach without delay. Electronically Signed   By: Ivar Drape M.D.   On: 06/24/2016 11:06    ROS  There were no vitals taken for this visit. Physical Exam  exam, the stoma is small but clear. The TEP is in place and functioning nicely. Oral cavity and pharynx are clear. There is diffuse woody fibrosis of the neck without any specific mass palpable.  Assessment/Plan Recommend barium swallow and then if there is no concerning findings, we will perform esophageal dilatation.   [name], MD 06/24/2016, 4:42 PM

## 2016-06-30 ENCOUNTER — Other Ambulatory Visit: Payer: Self-pay | Admitting: Obstetrics and Gynecology

## 2016-06-30 ENCOUNTER — Encounter (HOSPITAL_COMMUNITY): Payer: Self-pay

## 2016-06-30 ENCOUNTER — Encounter (HOSPITAL_COMMUNITY)
Admission: RE | Admit: 2016-06-30 | Discharge: 2016-06-30 | Disposition: A | Discharge status: Home / Self Care | Payer: Medicaid Other | Source: Ambulatory Visit | Attending: Otolaryngology | Admitting: Otolaryngology

## 2016-06-30 DIAGNOSIS — R569 Unspecified convulsions: Secondary | ICD-10-CM | POA: Diagnosis not present

## 2016-06-30 DIAGNOSIS — Z930 Tracheostomy status: Secondary | ICD-10-CM | POA: Diagnosis not present

## 2016-06-30 DIAGNOSIS — E039 Hypothyroidism, unspecified: Secondary | ICD-10-CM | POA: Insufficient documentation

## 2016-06-30 DIAGNOSIS — K222 Esophageal obstruction: Secondary | ICD-10-CM | POA: Insufficient documentation

## 2016-06-30 DIAGNOSIS — I10 Essential (primary) hypertension: Secondary | ICD-10-CM | POA: Insufficient documentation

## 2016-06-30 DIAGNOSIS — Z01812 Encounter for preprocedural laboratory examination: Secondary | ICD-10-CM | POA: Insufficient documentation

## 2016-06-30 DIAGNOSIS — Z8521 Personal history of malignant neoplasm of larynx: Secondary | ICD-10-CM | POA: Diagnosis not present

## 2016-06-30 DIAGNOSIS — D649 Anemia, unspecified: Secondary | ICD-10-CM | POA: Diagnosis not present

## 2016-06-30 DIAGNOSIS — J45909 Unspecified asthma, uncomplicated: Secondary | ICD-10-CM | POA: Diagnosis not present

## 2016-06-30 DIAGNOSIS — Z87891 Personal history of nicotine dependence: Secondary | ICD-10-CM | POA: Insufficient documentation

## 2016-06-30 DIAGNOSIS — I351 Nonrheumatic aortic (valve) insufficiency: Secondary | ICD-10-CM | POA: Insufficient documentation

## 2016-06-30 DIAGNOSIS — K219 Gastro-esophageal reflux disease without esophagitis: Secondary | ICD-10-CM | POA: Insufficient documentation

## 2016-06-30 HISTORY — DX: Pneumonia, unspecified organism: J18.9

## 2016-06-30 HISTORY — DX: Dyspnea, unspecified: R06.00

## 2016-06-30 HISTORY — DX: Sciatica, unspecified side: M54.30

## 2016-06-30 HISTORY — DX: Unspecified osteoarthritis, unspecified site: M19.90

## 2016-06-30 LAB — BASIC METABOLIC PANEL
Anion gap: 10 (ref 5–15)
BUN: 16 mg/dL (ref 6–20)
CO2: 22 mmol/L (ref 22–32)
CREATININE: 0.87 mg/dL (ref 0.44–1.00)
Calcium: 9.3 mg/dL (ref 8.9–10.3)
Chloride: 108 mmol/L (ref 101–111)
GFR calc Af Amer: >60 mL/min (ref >60)
GLUCOSE: 87 mg/dL (ref 65–99)
POTASSIUM: 4.5 mmol/L (ref 3.5–5.1)
Sodium: 140 mmol/L (ref 135–145)

## 2016-06-30 LAB — CBC
HEMATOCRIT: 37 % (ref 36.0–46.0)
Hemoglobin: 12 g/dL (ref 12.0–15.0)
MCH: 30.2 pg (ref 26.0–34.0)
MCHC: 32.4 g/dL (ref 30.0–36.0)
MCV: 93.2 fL (ref 78.0–100.0)
PLATELETS: 332 10*3/uL (ref 150–400)
RBC: 3.97 MIL/uL (ref 3.87–5.11)
RDW: 12.9 % (ref 11.5–15.5)
WBC: 4.8 10*3/uL (ref 4.0–10.5)

## 2016-06-30 MED ORDER — GABAPENTIN 300 MG PO CAPS
300.0000 mg | ORAL_CAPSULE | Freq: Every day | ORAL | 3 refills | Status: DC
Start: 2016-06-30 — End: 2017-01-14

## 2016-06-30 NOTE — Pre-Procedure Instructions (Signed)
    Deborah [name]  06/30/2016     Your procedure is scheduled on  Friday, January 19.  Report to [facility] Admitting at 11:30 AM                 Your surgery or procedure is scheduled for 1:30 PM   Call this number if you have problems the morning of surgery: [phone]                     For any other questions, please call [phone], Monday - Friday 8 AM - 4 PM.   Remember:  Do not eat food or drink liquids after midnight Thursday, January 18.  Take these medicines the morning of surgery with A SIP OF WATER :buPROPion (WELLBUTRIN XL), EQ LORATADINE,   levothyroxine (SYNTHROID, LEVOTHROID), metoprolol tartrate (LOPRESSOR), use Sprivia Inhaler, Dulera inhaler                  May take if needed:benzonatate (TESSALON), cyclobenzaprine (FLEXERIL); ma use Albuterol and bring it to the hospitial with you.                  1 Week prior to surgery STOP taking Aspirin, Aspirin Products (Goody Powder, Excedrin Migraine), Ibuprofen (Advil), Naproxen (Aleve), Vitamins and Herbal Products (ie Fish Oil)  Do not wear jewelry, make-up or nail polish.  Do not wear lotions, powders, or perfumes, or deoderant.  Do not shave 48 hours prior to surgery.  .  Do not bring valuables to the hospital.  [facility] is not responsible for any belongings or valuables.  Contacts, dentures or bridgework may not be worn into surgery.  Leave your suitcase in the car.  After surgery it may be brought to your room.  For patients admitted to the hospital, discharge time will be determined by your treatment team.  Patients discharged the day of surgery will not be allowed to drive home.   Name and phone number of your driver:   -  Special instructions:Review  [place] - Preparing For Surgery.  Please read over the following fact sheets that you were given: Review  [place] - Preparing For Surgery and Coughing and deep breathing.

## 2016-06-30 NOTE — Telephone Encounter (Signed)
Patient came by office request refill on RX gabapentin 300 mg. Please let patient know if this can be refilled.[name].  [facility] [address].

## 2016-07-01 IMAGING — RF DG ESOPHAGUS
14 of 24 series · 14 of 24 positions shown · non-contrast
Comparison: None.

CLINICAL DATA: Subsequent encounter for head neck cancer status
post laryngectomy. Now with a sensation of food getting stuck in
throat.

EXAM:
ESOPHOGRAM/BARIUM SWALLOW
TECHNIQUE: Combined double contrast and single contrast examination performed
using effervescent crystals, thick barium liquid, and thin barium
liquid.
FLUOROSCOPY TIME:  Radiation Exposure Index (as provided by the
fluoroscopic device):
If the device does not provide the exposure index:
Fluoroscopy Time:  2 minutes and 25 seconds
Number of Acquired Images:  3

[Series 1: run · 1 of 25 slices shown (1 of 14)]
[im 1/25]
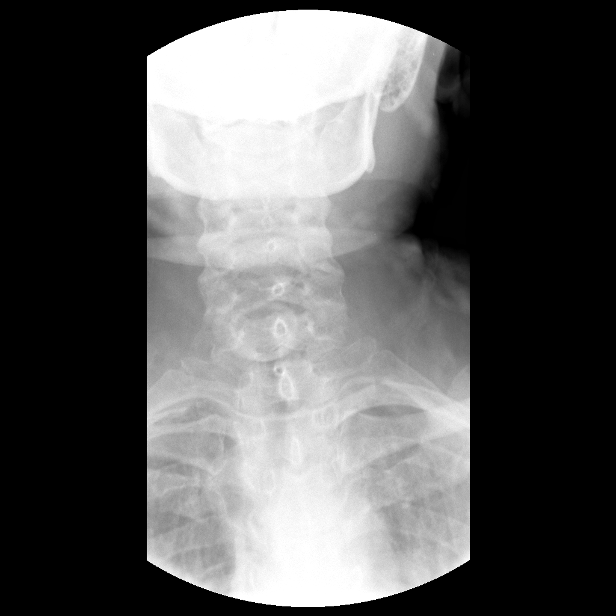

[Series 3: run · 1 of 28 slices shown (2 of 14)]
[im 1/28]
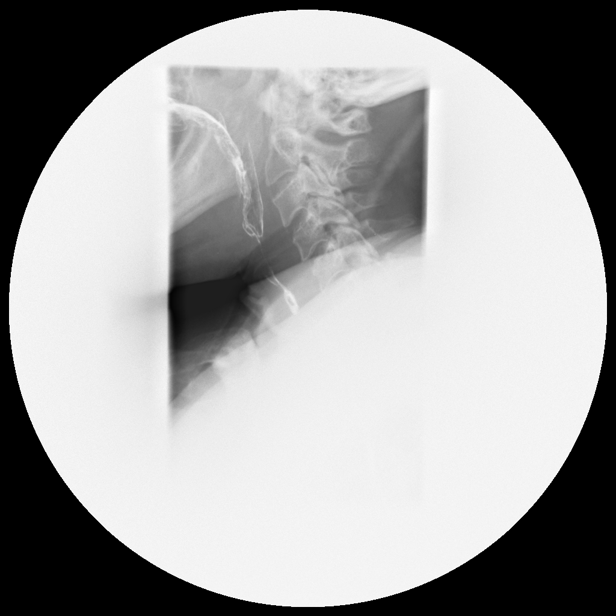

[Series 5: run · 1 of 1 slices shown (3 of 14)]
[im 1/1]
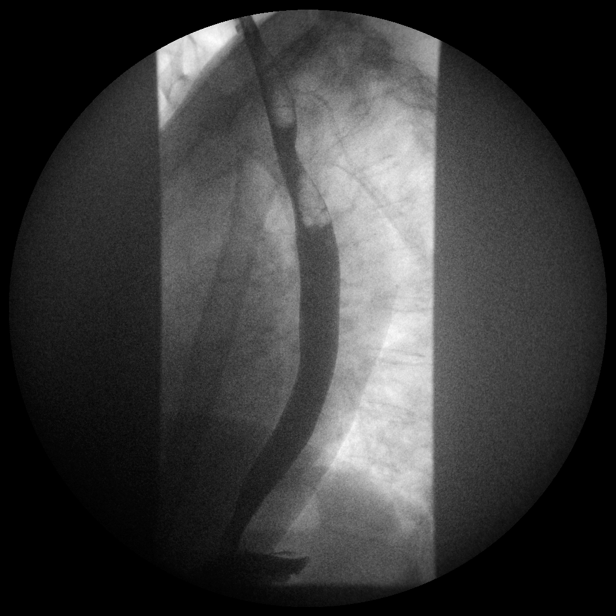

[Series 7: run · 1 of 1 slices shown (4 of 14)]
[im 1/1]
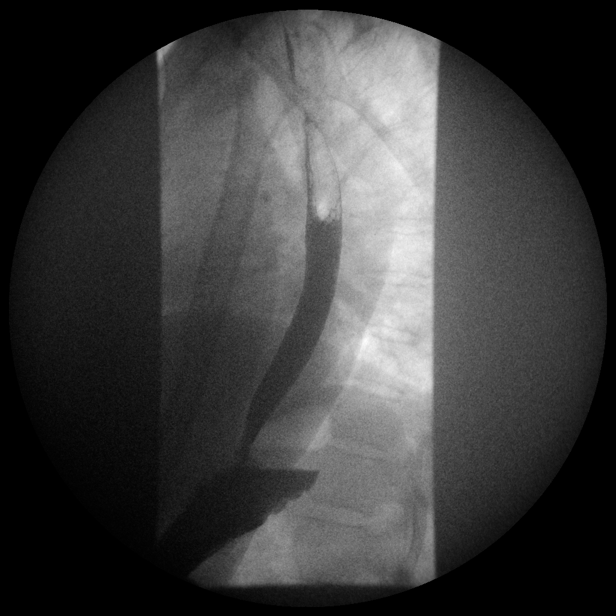

[Series 8: run · 1 of 1 slices shown (5 of 14)]
[im 1/1]
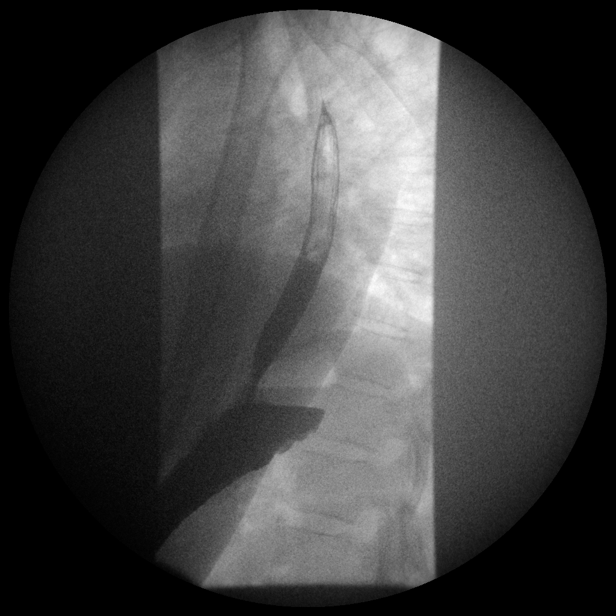

[Series 10: run · 1 of 1 slices shown (6 of 14)]
[im 1/1]
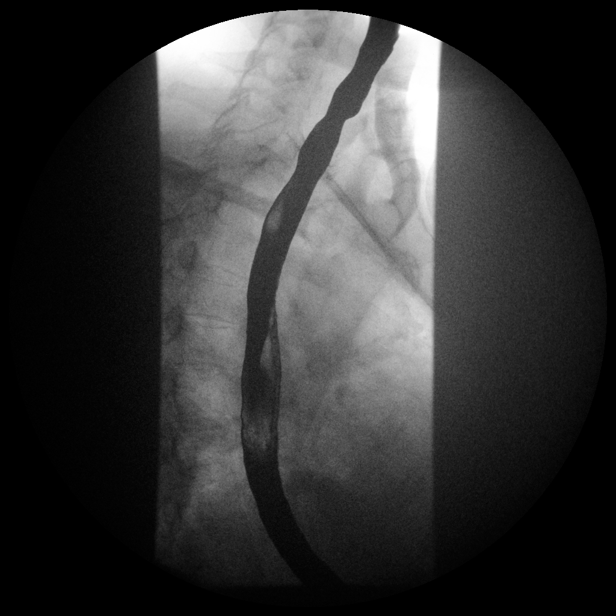

[Series 12: run · 1 of 1 slices shown (7 of 14)]
[im 1/1]
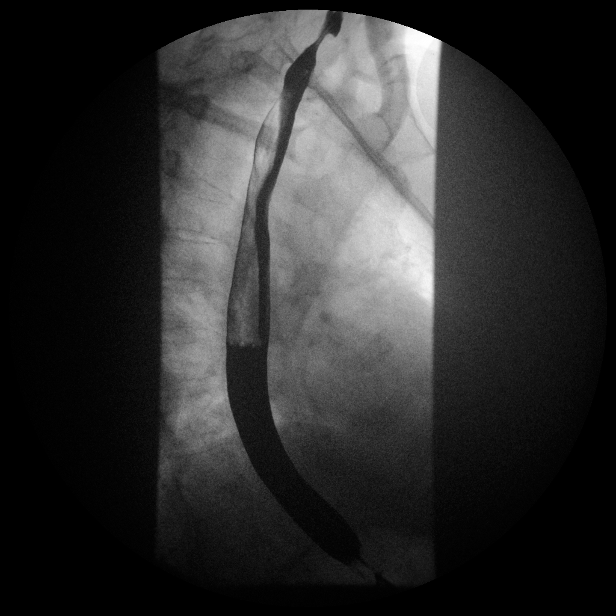

[Series 13: run · 1 of 1 slices shown (8 of 14)]
[im 1/1]
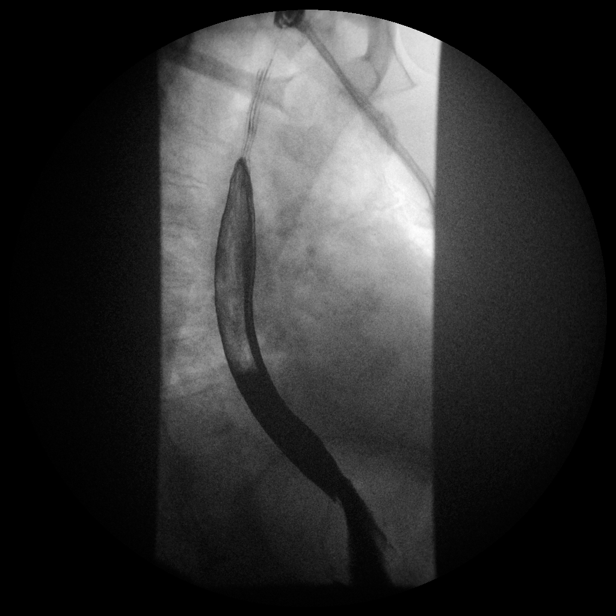

[Series 15: run · 1 of 1 slices shown (9 of 14)]
[im 1/1]
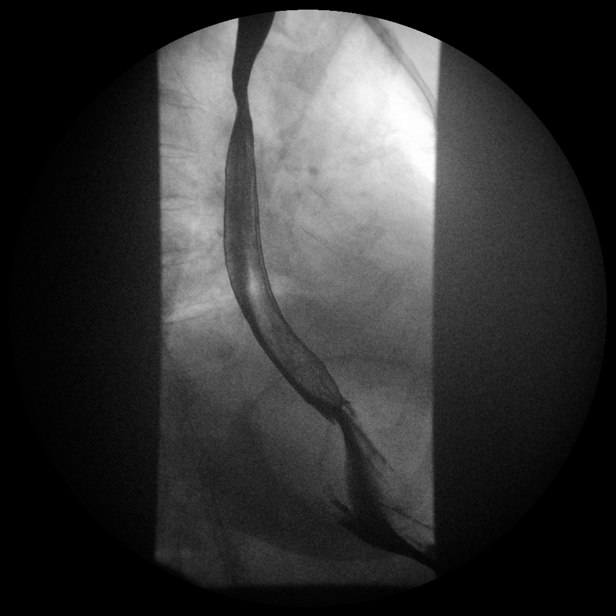

[Series 17: run · 1 of 1 slices shown (10 of 14)]
[im 1/1]
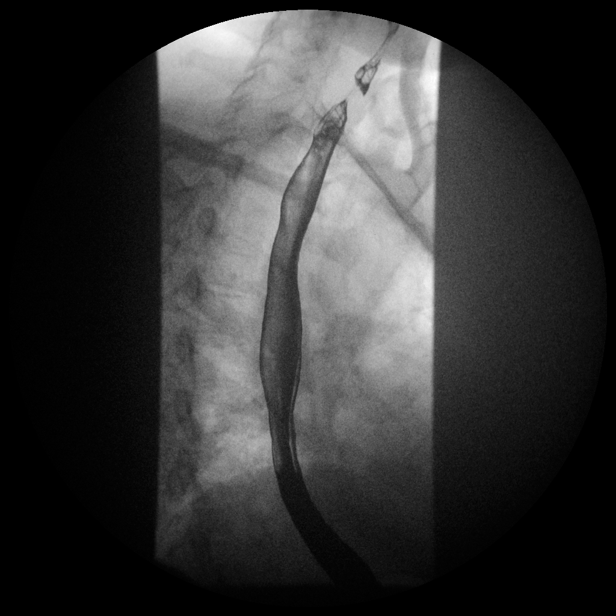

[Series 19: run · 1 of 1 slices shown (11 of 14)]
[im 1/1]
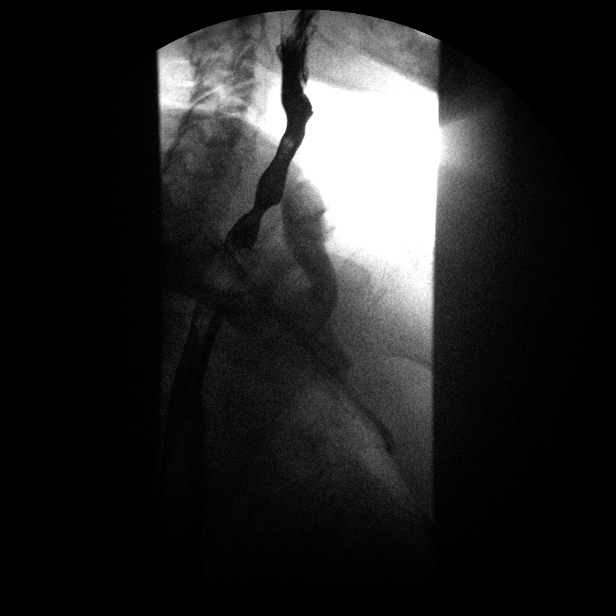

[Series 20: run · 1 of 1 slices shown (12 of 14)]
[im 1/1]
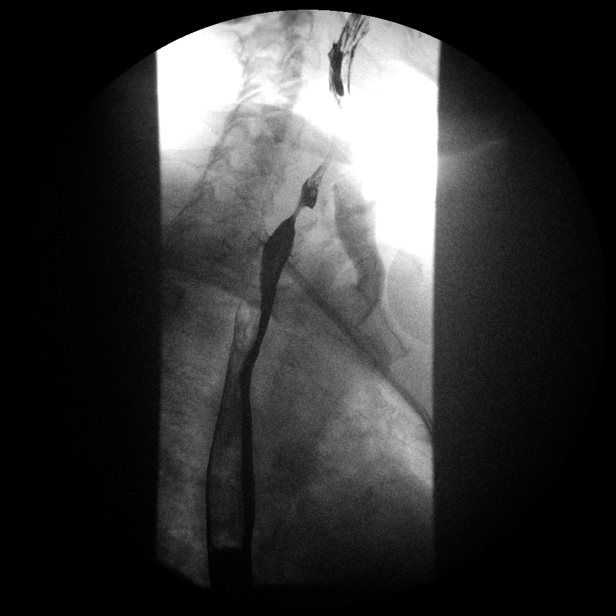

[Series 22: run · 1 of 1 slices shown (13 of 14)]
[im 1/1]
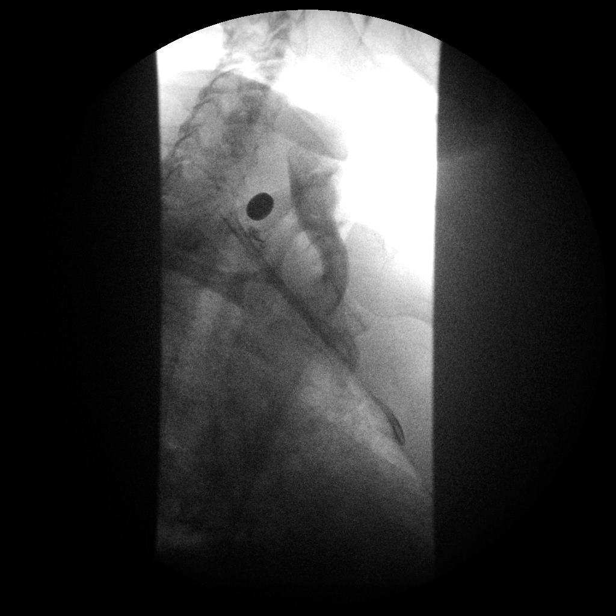

[Series 24: run · 1 of 1 slices shown (14 of 14)]
[im 1/1]
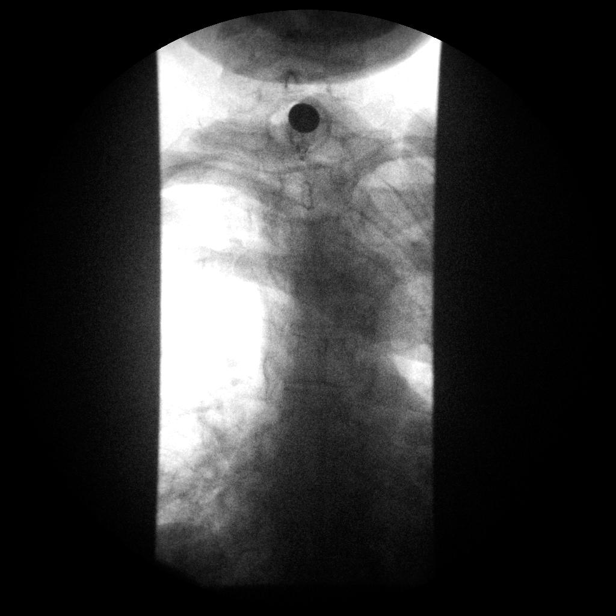

[14 of 24 positions shown; findings below may reference images not displayed]

FINDINGS: Frontal and lateral views of the hypopharynx show postsurgical
change.

Double contrast imaging of the esophagus shows focal narrowing
proximally, above the level of the thoracic inlet. The narrowing
show smoothly tapered shoulders and has no associated mucosal
irregularity. There is no gross intraluminal foreign body proximal
to the narrowing to suggest retained food and contrast material
passes readily through the narrowing into the more distal portions
of the esophagus. Double contrast imaging reveals no esophageal
ulceration or diverticulum.

Patient was given a 13 mm barium tablet with water. The tablet
becomes lodged just proximal to the proximal esophageal stricture.
IMPRESSION: Short smooth narrowing of the proximal esophagus obstructs passage
of a 13 mm barium tablet.

## 2016-07-01 NOTE — Progress Notes (Signed)
Anesthesia Chart Review:  Pt is a 61 year old female scheduled for esophagoscopy with dilitation on 07/04/2016 with [name], MD.   PMH includes:  HTN, aortic regurgitation, asthma, hypothyroidism, laryngeal cancer, esophageal stricture, anemia, tracheostomy dependent, seizures, GERD. Former smoker. BMI 32  Medications include: albuterol, dulera, levothyroxine, losartan, metoprolol, prilosec, spiriva  Preoperative labs reviewed.    1 view CXR 11/10/15: Mild vascular congestion noted.  Lungs remain grossly clear.  EKG 11/10/15: Sinus rhythm. Prolonged PR interval. LAE, consider biatrial enlargement. Anterior infarct, possibly acute. Lateral leads are also involved. Baseline wander in lead(s) V3 V4 V5 V6. Partial missing lead(s): V2 V3 V4 V5 V6. No significant change since last tracing - Will repeat EKG DOS  Echo 04/29/14:  - Left ventricle: The cavity size was normal. There was mild focalbasal hypertrophy of the septum. Systolic function was normal.The estimated ejection fraction was in the range of 55% to 60%.Images were inadequate for LV wall motion assessment. - Aortic valve: There was moderate regurgitation. - Pulmonic valve: There was trivial regurgitation. - Pericardium, extracardiac: A trivial pericardial effusion wasidentified posterior to the heart.  Reviewed case with Dr. Linna Caprice. If no acute CV sx, I anticipate pt can proceed as scheduled.   Willeen Cass, FNP-BC [facility] Short Stay Surgical Center/Anesthesiology Phone: [phone] 07/01/2016 5:12 PM

## 2016-07-04 ENCOUNTER — Ambulatory Visit (HOSPITAL_COMMUNITY)
Admission: RE | Admit: 2016-07-04 | Discharge: 2016-07-04 | Disposition: A | Discharge status: Home / Self Care | Payer: Medicaid Other | Source: Ambulatory Visit | Attending: Otolaryngology | Admitting: Otolaryngology

## 2016-07-04 ENCOUNTER — Encounter (HOSPITAL_COMMUNITY)
Admission: RE | Disposition: A | Discharge status: Home / Self Care | Payer: Self-pay | Source: Ambulatory Visit | Attending: Otolaryngology

## 2016-07-04 ENCOUNTER — Ambulatory Visit (HOSPITAL_COMMUNITY): Payer: Medicaid Other | Admitting: Anesthesiology

## 2016-07-04 ENCOUNTER — Ambulatory Visit (HOSPITAL_COMMUNITY): Payer: Medicaid Other | Admitting: Emergency Medicine

## 2016-07-04 ENCOUNTER — Encounter (HOSPITAL_COMMUNITY): Payer: Self-pay | Admitting: *Deleted

## 2016-07-04 DIAGNOSIS — Z9002 Acquired absence of larynx: Secondary | ICD-10-CM | POA: Insufficient documentation

## 2016-07-04 DIAGNOSIS — Z8521 Personal history of malignant neoplasm of larynx: Secondary | ICD-10-CM | POA: Diagnosis not present

## 2016-07-04 DIAGNOSIS — K219 Gastro-esophageal reflux disease without esophagitis: Secondary | ICD-10-CM | POA: Diagnosis not present

## 2016-07-04 DIAGNOSIS — R569 Unspecified convulsions: Secondary | ICD-10-CM | POA: Insufficient documentation

## 2016-07-04 DIAGNOSIS — Z8719 Personal history of other diseases of the digestive system: Secondary | ICD-10-CM | POA: Diagnosis not present

## 2016-07-04 DIAGNOSIS — Z888 Allergy status to other drugs, medicaments and biological substances status: Secondary | ICD-10-CM | POA: Diagnosis not present

## 2016-07-04 DIAGNOSIS — E890 Postprocedural hypothyroidism: Secondary | ICD-10-CM | POA: Diagnosis not present

## 2016-07-04 DIAGNOSIS — R011 Cardiac murmur, unspecified: Secondary | ICD-10-CM | POA: Insufficient documentation

## 2016-07-04 DIAGNOSIS — Z923 Personal history of irradiation: Secondary | ICD-10-CM | POA: Diagnosis not present

## 2016-07-04 DIAGNOSIS — J449 Chronic obstructive pulmonary disease, unspecified: Secondary | ICD-10-CM | POA: Diagnosis not present

## 2016-07-04 DIAGNOSIS — I10 Essential (primary) hypertension: Secondary | ICD-10-CM | POA: Insufficient documentation

## 2016-07-04 DIAGNOSIS — K222 Esophageal obstruction: Secondary | ICD-10-CM | POA: Diagnosis not present

## 2016-07-04 DIAGNOSIS — Z6832 Body mass index (BMI) 32.0-32.9, adult: Secondary | ICD-10-CM | POA: Insufficient documentation

## 2016-07-04 DIAGNOSIS — F419 Anxiety disorder, unspecified: Secondary | ICD-10-CM | POA: Diagnosis not present

## 2016-07-04 DIAGNOSIS — Z87891 Personal history of nicotine dependence: Secondary | ICD-10-CM | POA: Insufficient documentation

## 2016-07-04 DIAGNOSIS — E669 Obesity, unspecified: Secondary | ICD-10-CM | POA: Diagnosis not present

## 2016-07-04 HISTORY — PX: ESOPHAGOSCOPY WITH DILITATION: SHX5618

## 2016-07-04 SURGERY — ESOPHAGOSCOPY, WITH DILATION
Anesthesia: General

## 2016-07-04 MED ORDER — MIDAZOLAM HCL 5 MG/5ML IJ SOLN
INTRAMUSCULAR | Status: DC | PRN
  Administered 2016-07-04: 2 mg via INTRAVENOUS

## 2016-07-04 MED ORDER — METOPROLOL TARTRATE 12.5 MG HALF TABLET
ORAL_TABLET | ORAL | Status: AC
  Filled 2016-07-04: qty 2

## 2016-07-04 MED ORDER — SUCCINYLCHOLINE CHLORIDE 200 MG/10ML IV SOSY
PREFILLED_SYRINGE | INTRAVENOUS | Status: AC
  Filled 2016-07-04: qty 10

## 2016-07-04 MED ORDER — METOPROLOL TARTRATE 25 MG PO TABS
25.0000 mg | ORAL_TABLET | Freq: Once | ORAL | Status: AC
  Administered 2016-07-04: 25 mg via ORAL

## 2016-07-04 MED ORDER — FENTANYL CITRATE (PF) 100 MCG/2ML IJ SOLN
INTRAMUSCULAR | Status: DC | PRN
  Administered 2016-07-04: 100 ug via INTRAVENOUS

## 2016-07-04 MED ORDER — PROPOFOL 10 MG/ML IV BOLUS
INTRAVENOUS | Status: AC
  Filled 2016-07-04: qty 20

## 2016-07-04 MED ORDER — MIDAZOLAM HCL 2 MG/2ML IJ SOLN
INTRAMUSCULAR | Status: AC
  Filled 2016-07-04: qty 2

## 2016-07-04 MED ORDER — FENTANYL CITRATE (PF) 100 MCG/2ML IJ SOLN
INTRAMUSCULAR | Status: AC
  Filled 2016-07-04: qty 2

## 2016-07-04 MED ORDER — EPINEPHRINE HCL (NASAL) 0.1 % NA SOLN
NASAL | Status: AC
  Filled 2016-07-04: qty 30

## 2016-07-04 MED ORDER — LACTATED RINGERS IV SOLN
INTRAVENOUS | Status: DC
  Administered 2016-07-04: 50 mL/h via INTRAVENOUS

## 2016-07-04 MED ORDER — LIDOCAINE 2% (20 MG/ML) 5 ML SYRINGE
INTRAMUSCULAR | Status: DC | PRN
  Administered 2016-07-04: 100 mg via INTRAVENOUS

## 2016-07-04 MED ORDER — PROPOFOL 10 MG/ML IV BOLUS
INTRAVENOUS | Status: DC | PRN
  Administered 2016-07-04: 200 mg via INTRAVENOUS

## 2016-07-04 SURGICAL SUPPLY — 10 items
CANISTER SUCTION 2500CC (MISCELLANEOUS) ×2 IMPLANT
COVER TABLE BACK 60X90 (DRAPES) ×2 IMPLANT
DRAPE PROXIMA HALF (DRAPES) ×2 IMPLANT
GLOVE BIO SURGEON STRL SZ7.5 (GLOVE) ×2 IMPLANT
KIT ROOM TURNOVER OR (KITS) ×2 IMPLANT
NS IRRIG 1000ML POUR BTL (IV SOLUTION) ×2 IMPLANT
PAD ARMBOARD 7.5X6 YLW CONV (MISCELLANEOUS) ×4 IMPLANT
SURGILUBE 2OZ TUBE FLIPTOP (MISCELLANEOUS) ×2 IMPLANT
TOWEL OR 17X24 6PK STRL BLUE (TOWEL DISPOSABLE) ×2 IMPLANT
TUBE CONNECTING 12X1/4 (SUCTIONS) ×2 IMPLANT

## 2016-07-04 NOTE — Anesthesia Preprocedure Evaluation (Signed)
Anesthesia Evaluation  Patient identified by MRN, date of birth, ID band Patient awake    Reviewed: Allergy & Precautions, NPO status , Patient's Chart, lab work & pertinent test results, reviewed documented beta blocker date and time   Airway Mallampati: Trach  TM Distance: >3 FB Neck ROM: Full    Dental  (+) Edentulous Upper, Edentulous Lower, Dental Advisory Given   Pulmonary shortness of breath, COPD, former smoker,  Tracheostomy SP laryngectomy   breath sounds clear to auscultation       Cardiovascular hypertension, Pt. on medications  Rhythm:Regular  ECHO 04/2014 EF 60%   Neuro/Psych Seizures -,  Anxiety    GI/Hepatic Neg liver ROS, GERD  Medicated,  Endo/Other  negative endocrine ROS  Renal/GU negative Renal ROS     Musculoskeletal   Abdominal (+) + obese,   Peds  Hematology 11/33   Anesthesia Other Findings SP laryngectomy 2012  Reproductive/Obstetrics                             Anesthesia Physical  Anesthesia Plan  ASA: III  Anesthesia Plan: General   Post-op Pain Management:    Induction: Intravenous  Airway Management Planned:   Additional Equipment:   Intra-op Plan:   Post-operative Plan:   Informed Consent: I have reviewed the patients History and Physical, chart, labs and discussed the procedure including the risks, benefits and alternatives for the proposed anesthesia with the patient or authorized representative who has indicated his/her understanding and acceptance.     Plan Discussed with:   Anesthesia Plan Comments:         Anesthesia Quick Evaluation

## 2016-07-04 NOTE — Transfer of Care (Signed)
Immediate Anesthesia Transfer of Care Note  Patient: Deborah [name]  Procedure(s) Performed: Procedure(s): ESOPHAGOSCOPY WITH DILITATION (N/A)  Patient Location: PACU  Anesthesia Type:General  Level of Consciousness: awake  Airway & Oxygen Therapy: Patient Spontanous Breathing and oxygen via mask over tracheostomy.  Post-op Assessment: Report given to RN, Post -op Vital signs reviewed and stable and Patient moving all extremities  Post vital signs: Reviewed and stable  Last Vitals:  Vitals:   07/04/16 1113 07/04/16 1147  BP: (!) 159/85   Pulse:  74  Resp: 20   Temp: 37.2 C     Last Pain:  Vitals:   07/04/16 1113  TempSrc: Oral      Patients Stated Pain Goal: 5 ([phone] [id number])  Complications: No apparent anesthesia complications

## 2016-07-04 NOTE — Anesthesia Procedure Notes (Signed)
Procedure Name: Intubation Date/Time: 07/04/2016 1:19 PM Performed by: Melina Copa, Susano Cleckler R Pre-anesthesia Checklist: Patient identified, Emergency Drugs available, Suction available, Patient being monitored and Timeout performed Patient Re-evaluated:Patient Re-evaluated prior to inductionOxygen Delivery Method: Circle system utilized Preoxygenation: Pre-oxygenation with 100% oxygen Intubation Type: IV induction Tube type: Reinforced Tube size: 5.5 mm Number of attempts: 1 (Reinforced ETT placed through tracheostomy stoma X 1 attempt) Placement Confirmation: positive ETCO2 and breath sounds checked- equal and bilateral Tube secured with: Tape Dental Injury: Teeth and Oropharynx as per pre-operative assessment

## 2016-07-04 NOTE — Anesthesia Postprocedure Evaluation (Addendum)
Anesthesia Post Note  Patient: Deborah [name]  Procedure(s) Performed: Procedure(s) (LRB): ESOPHAGOSCOPY WITH DILITATION (N/A)  Patient location during evaluation: PACU Anesthesia Type: General Level of consciousness: sedated Pain management: pain level controlled Vital Signs Assessment: post-procedure vital signs reviewed and stable Respiratory status: spontaneous breathing and respiratory function stable Cardiovascular status: stable Anesthetic complications: no       Last Vitals:  Vitals:   07/04/16 1430 07/04/16 1444  BP: (!) 162/88 (!) 160/81  Pulse:  66  Resp:    Temp: 36.8 C     Last Pain:  Vitals:   07/04/16 1445  TempSrc:   PainSc: 0-No pain                 [name]

## 2016-07-04 NOTE — Discharge Instructions (Signed)
Advance diet over the next couple of days to regular diet.

## 2016-07-04 NOTE — Op Note (Signed)
OPERATIVE REPORT  DATE OF SURGERY: 07/04/2016  PATIENT:  Deborah [name],  61 y.o. female  PRE-OPERATIVE DIAGNOSIS:  HISTORY OF LARYNGEAL CANCER, esophageal stricture  POST-OPERATIVE DIAGNOSIS: Same  PROCEDURE:  Procedure(s): ESOPHAGOSCOPY WITH DILITATION  SURGEON:  Beckie Salts, MD  ASSISTANTS: None  ANESTHESIA:   General   EBL:  0 ml  DRAINS: None  LOCAL MEDICATIONS USED:  None  SPECIMEN:  none  COUNTS:  Correct  PROCEDURE DETAILS: The patient was taken to the operating room and placed on the operating table in the supine position. Following induction of general endotracheal anesthesia, the table was turned 90 and the patient was draped in a standard fashion. The cervical esophagus scope was entered into the oral cavity used to visualize the pharynx. The esophagus could not be entered. Esophagus was then dilated with [facility] dilators serially starting at 16 [place] and progressing all the way to [address] [place]. After that, the scope was able to be passed a little bit further but still it was difficult to pass all the way and I did not want to risk damaging mucosa or perforation. Scope was removed and the patient was awakened and extubated and transferred to recovery in stable condition.    PATIENT DISPOSITION:  To PACU, stable

## 2016-07-04 NOTE — Interval H&P Note (Signed)
History and Physical Interval Note:  07/04/2016 12:47 PM  Deborah [name]  has presented today for surgery, with the diagnosis of HISTORY OF LARYNGEAL CANCER,PHARYNGOESOPHAGEAL  The various methods of treatment have been discussed with the patient and family. After consideration of risks, benefits and other options for treatment, the patient has consented to  Procedure(s): ESOPHAGOSCOPY WITH DILITATION (N/A) as a surgical intervention .  The patient's history has been reviewed, patient examined, no change in status, stable for surgery.  I have reviewed the patient's chart and labs.  Questions were answered to the patient's satisfaction.     Hasten Sweitzer

## 2016-07-04 NOTE — H&P (View-Only) (Signed)
Deborah [name] is an 61 y.o. female.   Chief Complaint: difficulty swallowing HPI: Almost 7 years posttreatment. About 3 weeks ago, she started having trouble swallowing again as she had a few years ago.      Past Medical History:  Diagnosis Date  . Anemia   . Asthma   . Bronchitis   . COPD (chronic obstructive pulmonary disease) ([place])   . Diverticulosis 11/15/2012    noted on screening colonoscopy   . Esophageal stricture   . Former smoker 03/19/2011  . GERD (gastroesophageal reflux disease)   . Heart murmur    asymptomatic   . History of laryngectomy   . 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 Hypertension   . 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 Internal hemorrhoid 11/15/2012    small, noted on screening colonoscopy   . 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  . Leukocytopenia   . Nausea alone 07/28/2013  . Neck pain 01/21/2012  . Normal MRI 07/14/11   negative for mestasis   . Seizures ([place])    07/24/11 off Effexor w/o seizure  . Sepsis ([place]) 08/04/12  . Sinusitis, chronic 07/20/2011  . Sinusitis, chronic 07/20/2011   Bilateral maxillary, identified on MRI of head 07/14/11.    . Tracheostomy dependent [facility])     Past Surgical History:  Procedure Laterality Date  . BREAST SURGERY    . COLONOSCOPY N/A 11/15/2012   Procedure: COLONOSCOPY;  Surgeon: Lafayette Dragon, MD;  Location: [facility];  Service: Endoscopy;  Laterality: N/A;  . ESOPHAGOSCOPY  06/21/2012   Procedure: ESOPHAGOSCOPY;  Surgeon: [name], MD;  Location: [place];  Service: ENT;  Laterality: N/A;  . ESOPHAGOSCOPY WITH DILITATION N/A 09/21/2014   Procedure: ESOPHAGOSCOPY WITH DILITATION;  Surgeon: [name], MD;  Location: [place];  Service: ENT;  Laterality: N/A;  . FOREIGN BODY REMOVAL BRONCHIAL  10/02/2011   Procedure: REMOVAL FOREIGN BODY BRONCHIAL;  Surgeon: Ruby Cola, MD;  Location: [place];  Service: ENT;   Laterality: N/A;  . LARYNGECTOMY    . PORTACATH PLACEMENT  09/17/10   Tip in cavoatrial junction  . TRACHEAL DILITATION  07/16/2011   Procedure: TRACHEAL DILITATION;  Surgeon: Beckie Salts, MD;  Location: [place];  Service: ENT;  Laterality: N/A;  dilation of tracheal stoma and replacement of stoma tube  . TUBAL LIGATION  1982    Family History  Problem Relation Age of Onset  . Heart disease Mother   . Heart disease Father   . Heart disease Sister    Social History:  reports that she quit smoking about 5 years ago. Her smoking use included Cigarettes. She has a 40.00 pack-year smoking history. She has never used smokeless tobacco. She reports that she drinks alcohol. She reports that she does not use drugs.  Allergies:  Allergies  Allergen Reactions  . Lisinopril Cough     (Not in a hospital admission)  No results found for this or any previous visit (from the past 48 hour(s)). Dg Esophagus  Result Date: 06/24/2016 CLINICAL DATA:  Dysphagia, history of laryngeal carcinoma EXAM: ESOPHOGRAM/BARIUM SWALLOW TECHNIQUE: Single contrast examination was performed using  thin barium. FLUOROSCOPY TIME:  Fluoroscopy Time:  1 minutes 24 seconds Radiation Exposure Index (if provided by the fluoroscopic device): 307 mGy Number of Acquired Spot Images: 0 COMPARISON:  Barium swallow of 09/13/2014 FINDINGS: A single contrast barium swallow was performed  in this patient with tracheostomy. The swallowing mechanism is unremarkable. Compared to the prior study from 2016 the apparently extrinsic indentation upon the lower cervical esophagus from the right of midline, just above the tracheostomy is unchanged. This could be due to scarring, being stable over the intervening 2 years. A submucosal lesion however would be difficult to exclude as would indentation by thyroid nodule from the right. No aspiration or penetration is seen. Esophageal peristalsis is otherwise normal. There is a small hiatal hernia present  and mild gastroesophageal reflux is demonstrated. A barium pill was given at the end the study which passed into the stomach without significant delay. IMPRESSION: 1. As noted on the barium swallow from 2016, there is extrinsic indentation upon the lower cervical esophagus from the right of midline just above the tracheostomy. This has not changed over the intervening 2 years and most likely is therefore benign possibly related to postop scarring or benign submucosal lesion. 2. Small hiatal hernia with mild gastroesophageal reflux. 3. Barium pill passes into the stomach without delay. Electronically Signed   By: Ivar Drape M.D.   On: 06/24/2016 11:06    ROS  There were no vitals taken for this visit. Physical Exam  exam, the stoma is small but clear. The TEP is in place and functioning nicely. Oral cavity and pharynx are clear. There is diffuse woody fibrosis of the neck without any specific mass palpable.  Assessment/Plan Recommend barium swallow and then if there is no concerning findings, we will perform esophageal dilatation.   [name], MD 06/24/2016, 4:42 PM

## 2016-07-05 ENCOUNTER — Encounter (HOSPITAL_COMMUNITY): Payer: Self-pay | Admitting: Otolaryngology

## 2016-07-08 ENCOUNTER — Ambulatory Visit (INDEPENDENT_AMBULATORY_CARE_PROVIDER_SITE_OTHER): Payer: Medicaid Other | Admitting: Obstetrics and Gynecology

## 2016-07-08 ENCOUNTER — Encounter: Payer: Self-pay | Admitting: Obstetrics and Gynecology

## 2016-07-08 VITALS — BP 118/68 | HR 98 | Temp 98.4°F | Ht 67.0 in | Wt 209.0 lb

## 2016-07-08 DIAGNOSIS — J441 Chronic obstructive pulmonary disease with (acute) exacerbation: Secondary | ICD-10-CM | POA: Diagnosis present

## 2016-07-08 DIAGNOSIS — J189 Pneumonia, unspecified organism: Secondary | ICD-10-CM | POA: Diagnosis not present

## 2016-07-08 MED ORDER — DOXYCYCLINE HYCLATE 100 MG PO TABS: 100.0000 mg | ORAL_TABLET | Freq: Two times a day (BID) | ORAL | 0 refills | Status: AC

## 2016-07-08 MED ORDER — SCOPOLAMINE 1 MG/3DAYS TD PT72: 1.0000 | MEDICATED_PATCH | TRANSDERMAL | 0 refills | Status: DC

## 2016-07-08 NOTE — Progress Notes (Signed)
   Subjective:   Patient ID: Deborah [name], female    DOB: [birth year], 61 y.o.   MRN: [id number]  Patient presents for Same Day Appointment  Chief Complaint  Patient presents with  . Cough    HPI: # COUGH Has had cough for about 2 weeks Had an URI a week before her dilation surgery symptoms still present No sick contacts Endorses some wheezing associated with cough Also associated with congestion. Nausea, headache, and secretions Had esophageal dilation recently Had to take trach out due to secretions and coughing because it was making her gag and hard to breath Denies fevers, chest pain Some dyspnea with excessive coughing; no hemoptysis   PMH significant for COPD, s/p trachelectomy   Review of Systems   See HPI for ROS.   History  Smoking Status  . Former Smoker  . Packs/day: 2.00  . Years: 20.00  . Types: Cigarettes  . Quit date: 09/17/2010  Smokeless Tobacco  . Never Used    Past medical history, surgical, family, and social history reviewed and updated in the EMR as appropriate.  Objective:  BP 118/68   Pulse 98   Temp 98.4 F (36.9 C) (Oral)   Ht 5\' 7"  (1.702 m)   Wt 209 lb (94.8 kg)   SpO2 92%   BMI 32.73 kg/m  Vitals and nursing note reviewed  Physical Exam  Constitutional: She is well-developed, well-nourished, and in no distress.  HENT:  Mouth/Throat: Oropharynx is clear and moist.  Eyes: Conjunctivae are normal. Pupils are equal, round, and reactive to light.  Neck:  Tracheostomy out; site unremarkable  Cardiovascular: Normal rate, regular rhythm and normal heart sounds.   Pulmonary/Chest: Effort normal. She has no wheezes. She has rales.    Assessment & Plan:  1. Community acquired pneumonia of left lung, unspecified part of lung Concern for CAP in patient with prolonged URI symptoms and cough that is not improving. Rales on left present on exam. Afebrile and vitals are stable. Appropriate for outpatient treatment. Will hold off on  imaging as it will not change treatment course at this time. Rx for doxycyline given for 5 days. Rx also for scopolamine patch to help with increased secretions. Return precautions discussed. Follow-up as needed or if symptoms worsen or not improved after treatment.    PATIENT EDUCATION PROVIDED: See AVS   Luiz Blare, DO 07/08/2016, 3:52 PM PGY-3, [place]

## 2016-07-08 NOTE — Patient Instructions (Signed)
Treating you like a pneumonia. Antibiotic sent to pharmacy Patch prescribed to help with secretions  Follow-up if not improved in 7 days or sooner if symptoms worsen

## 2016-07-10 ENCOUNTER — Telehealth: Payer: Self-pay | Admitting: Obstetrics and Gynecology

## 2016-07-10 ENCOUNTER — Encounter: Payer: Self-pay | Admitting: Obstetrics and Gynecology

## 2016-07-10 NOTE — Telephone Encounter (Signed)
Letter printed and placed up front. Pt notified.

## 2016-07-10 NOTE — Telephone Encounter (Signed)
Needs dr note so she can return to work.  Pt will pick up letter

## 2016-07-10 NOTE — Telephone Encounter (Signed)
Letter written. Please print so patient can pick up. Thanks!

## 2016-07-24 ENCOUNTER — Telehealth: Payer: Self-pay | Admitting: Obstetrics and Gynecology

## 2016-07-24 NOTE — Telephone Encounter (Signed)
[employer] form dropped off for at front desk for completion.  Verified that patient section of form has been completed.  Last DOS/WCC with PCP was 07/08/16.  Placed form in blue team folder to be completed by clinical staff.  Deborah [name]

## 2016-07-25 NOTE — Telephone Encounter (Signed)
LVM for patient to call and let me know what electric equipment she uses at home that would hinder her health if she were without power.

## 2016-07-25 NOTE — Telephone Encounter (Signed)
Clinical info completed on [employer] form.  Place form in Dr. Gerarda Fraction' box for completion.  Deborah [name], [name]

## 2016-07-25 NOTE — Telephone Encounter (Signed)
She has to have a humidfier, fan and air conditioning

## 2016-07-29 ENCOUNTER — Encounter (HOSPITAL_COMMUNITY): Payer: Self-pay | Admitting: Dentistry

## 2016-07-29 ENCOUNTER — Ambulatory Visit (HOSPITAL_COMMUNITY): Payer: Medicaid - Dental | Admitting: Dentistry

## 2016-07-29 ENCOUNTER — Encounter (INDEPENDENT_AMBULATORY_CARE_PROVIDER_SITE_OTHER): Payer: Self-pay

## 2016-07-29 VITALS — BP 138/84 | HR 70 | Temp 98.4°F

## 2016-07-29 DIAGNOSIS — K0820 Unspecified atrophy of edentulous alveolar ridge: Secondary | ICD-10-CM | POA: Diagnosis not present

## 2016-07-29 DIAGNOSIS — C321 Malignant neoplasm of supraglottis: Secondary | ICD-10-CM

## 2016-07-29 DIAGNOSIS — Z463 Encounter for fitting and adjustment of dental prosthetic device: Secondary | ICD-10-CM

## 2016-07-29 DIAGNOSIS — K000 Anodontia: Secondary | ICD-10-CM

## 2016-07-29 DIAGNOSIS — K121 Other forms of stomatitis: Secondary | ICD-10-CM

## 2016-07-29 DIAGNOSIS — K08109 Complete loss of teeth, unspecified cause, unspecified class: Secondary | ICD-10-CM

## 2016-07-29 DIAGNOSIS — Z923 Personal history of irradiation: Secondary | ICD-10-CM

## 2016-07-29 DIAGNOSIS — K062 Gingival and edentulous alveolar ridge lesions associated with trauma: Secondary | ICD-10-CM

## 2016-07-29 NOTE — Telephone Encounter (Signed)
Reviewed, completed, and signed form.  Note routed to RN team inbasket and placed completed form in Clinic RN's office (wall pocket above desk).  Luiz Blare, DO

## 2016-07-29 NOTE — Progress Notes (Signed)
Progress Note:  07/29/2016  Patient:            Deborah [name] Date of Birth:  October 06, 1955 MRN:                [id number]   BP 138/84 (BP Location: Left Arm)   Pulse 70   Temp 98.4 F (36.9 C) (Oral)    Deborah [name] is a 61 year old female with history of cancer of the supraglottis. Patient is status post radiation therapy. Lower dentures were fabricated and inserted on 02/27/2011. Upper and lower complete denture relines were inserted on 11/30/2012. She was last seen for denture recall on 01/30/2016. Patient now presents for evaluation of denture irritation involving the mandibular right anterior area #25 through 27.  SUBJECTIVE: Patient is complaining of denture irritation for the past week or so. Patient has been trying to keep the denture out as much as possible. Patient has been using salt water rinses.  OBJECTIVE: There is an ulceration present on the facial vestibule area #25 through 27. Pressure indicating paste was applied and denture adjustments made as needed. Dentures Were polished appropriately. The occlusion was evaluated and was found to be protruding to an end to end position.  The patient was instructed on how to find the maximum intercuspation position.  Patient expressed understanding. Patient was informed that biting in this position will lead to significant denture irritation to this area of the mouth.  ASSESSMENT: Denture irritation Patient is protruding to an end to end malocclusion position.  Plan: 1. Keep dentures out for 48 hours. 2. Use salt water rinses every 2 hours while awake to aid healing. 3. Reintroduce use of lower complete denture in 48 hours with significant effort to be made on finding maximum intercuspation position and avoiding the protrusion of her lower jaw and denture. 4. Return to clinic in approximately 2 weeks as scheduled.  5. Call if condition worsens over that period of time.  Lenn Cal, DDS

## 2016-07-29 NOTE — Patient Instructions (Signed)
Plan: 1. Keep dentures out for 48 hours. 2. Use salt water rinses every 2 hours while awake to aid healing. 3. Reintroduce use of lower complete denture in 48 hours with significant effort to be made on finding maximum intercuspation position and avoiding the protrusion of her lower jaw and denture. 4. Return to clinic in approximately 2 weeks as scheduled.  5. Call if condition worsens over that period of time.  Lenn Cal, DDS

## 2016-07-29 NOTE — Telephone Encounter (Signed)
Left voice message for patient that form was complete and ready for pickup.  Derl Barrow, RN

## 2016-08-12 ENCOUNTER — Other Ambulatory Visit: Payer: Self-pay | Admitting: Obstetrics and Gynecology

## 2016-08-12 ENCOUNTER — Encounter (HOSPITAL_COMMUNITY): Payer: Self-pay | Admitting: Dentistry

## 2016-08-13 ENCOUNTER — Telehealth: Payer: Self-pay | Admitting: Adult Health

## 2016-08-13 NOTE — Telephone Encounter (Signed)
Patient called to have appointment rescheduled to 3/2 instead of 3/1. Okay per nurse.

## 2016-08-14 ENCOUNTER — Other Ambulatory Visit: Payer: Self-pay

## 2016-08-14 ENCOUNTER — Encounter: Payer: Self-pay | Admitting: Adult Health

## 2016-08-15 ENCOUNTER — Encounter: Payer: Self-pay | Admitting: Adult Health

## 2016-08-15 ENCOUNTER — Other Ambulatory Visit: Payer: Self-pay

## 2016-08-15 NOTE — Progress Notes (Deleted)
CLINIC:  Survivorship  REASON FOR VISIT:  Routine follow-up for history of head & neck cancer.  BRIEF ONCOLOGIC HISTORY:   No history exists.     INTERVAL HISTORY:  ***  -Pain:  -Nutrition/Diet:  -Dysphagia?:  -Dental issues?: sees Dr. Enrique Sack, endentulous, has dentures -Last TSH: 2.147 on 08/20/2015 -Weight: (LOSS/GAIN) since (last visit)   -Last ENT visit:  06/24/2016 -Last Rad Onc visit:  -Last Dentist visit: 07/29/2016    ADDITIONAL RE/VIEW OF SYSTEMS:  ROS    CURRENT MEDICATIONS:  Current Outpatient Prescriptions on File Prior to Visit  Medication Sig Dispense Refill  . albuterol (PROVENTIL HFA) 108 (90 Base) MCG/ACT inhaler INHALE 2 PUFFS INTO THE LUNGS EVERY 4 HOURS AS NEEDED FOR WHEEZING OR SHORTNESS OF BREATH. (Patient taking differently: Inhale 2 puffs into the lungs every 4 (four) hours as needed for wheezing or shortness of breath. ) 2 each 0  . benzonatate (TESSALON) 100 MG capsule TAKE ONE CAPSULE BY MOUTH THREE TIMES DAILY AS NEEDED FOR COUGH 60 capsule 0  . buPROPion (WELLBUTRIN XL) 150 MG 24 hr tablet TAKE ONE TABLET BY MOUTH ONCE DAILY 90 tablet 0  . cyclobenzaprine (FLEXERIL) 10 MG tablet TAKE ONE TABLET BY MOUTH TWICE DAILY FOR MUSCLE SPASM (Patient taking differently: TAKE ONE TABLET BY MOUTH TWICE DAILY  AS NEEDED FOR MUSCLE SPASM) 90 tablet 2  . dextromethorphan-guaiFENesin (MUCINEX DM) 30-600 MG per 12 hr tablet Take 1 tablet by mouth 2 (two) times daily as needed for cough. 30 tablet 2  . DULERA 200-5 MCG/ACT AERO INHALE TWO PUFFS BY MOUTH TWICE DAILY 13 g 2  . EQ LORATADINE 10 MG tablet TAKE ONE TABLET BY MOUTH ONCE DAILY 90 tablet 2  . gabapentin (NEURONTIN) 300 MG capsule Take 1 capsule (300 mg total) by mouth at bedtime. 30 capsule 3  . ibuprofen (ADVIL,MOTRIN) 600 MG tablet TAKE ONE TABLET BY MOUTH EVERY 8 HOURS AS NEEDED FOR  MODERATE  PAIN (Patient taking differently: Take 600 mg by mouth every 8 (eight) hours as needed for moderate pain. )  30 tablet 1  . levothyroxine (SYNTHROID, LEVOTHROID) 175 MCG tablet TAKE ONE TABLET BY MOUTH ONCE DAILY BEFORE BREAKFAST 60 tablet 1  . losartan (COZAAR) 100 MG tablet TAKE ONE TABLET BY MOUTH ONCE DAILY 30 tablet 5  . MELATONIN PO Take 1 tablet by mouth at bedtime as needed (sleep).    . metoprolol tartrate (LOPRESSOR) 25 MG tablet TAKE ONE TABLET BY MOUTH TWICE DAILY 60 tablet 6  . nystatin cream (MYCOSTATIN) Apply 1 application topically 2 (two) times daily. Until rash resolves. (Patient taking differently: Apply 1 application topically 2 (two) times daily as needed. RASH) 30 g 2  . omeprazole (PRILOSEC) 40 MG capsule TAKE ONE CAPSULE BY MOUTH IN THE MORNING. TAKE AT LEAST 4 HOURS AFTER TAKING LEVOTHYROXINE 30 capsule 3  . ondansetron (ZOFRAN-ODT) 4 MG disintegrating tablet DISSOLVE ONE TABLET IN MOUTH EVERY 8 HOURS AS NEEDED FOR NAUSEA 60 tablet 0  . promethazine (PHENERGAN) 25 MG suppository Place 1 suppository (25 mg total) rectally every 6 (six) hours as needed for nausea or vomiting. 12 each 0  . scopolamine (TRANSDERM-SCOP, 1.5 MG,) 1 MG/3DAYS Place 1 patch (1.5 mg total) onto the skin every 3 (three) days. 10 patch 0  . SPIRIVA HANDIHALER 18 MCG inhalation capsule PLACE ONE CAPSULE INTO INHALER AND INHALE DAILY. 30 capsule 5   No current facility-administered medications on file prior to visit.     ALLERGIES:  Allergies  Allergen Reactions  . Lisinopril Cough     PHYSICAL EXAM:  There were no vitals filed for this visit. There were no vitals filed for this visit.  Weight Date                     Pre-treatment (RT consult date):    General: Well-nourished, well-appearing female/female*** in no acute distress.  Accompanied/Unaccompanied today.  HEENT: Head is atraumatic and normocephalic.  Pupils equal and reactive to light. Conjunctivae clear without exudate.  Sclerae anicteric. Oral mucosa is pink and moist without lesions.  Tongue pink, moist, and midline. Oropharynx is  pink and moist, without lesions. Lymph: No preauricular, postauricular, cervical, supraclavicular, or infraclavicular lymphadenopathy noted on palpation.   Neck: No palpable masses. Skin on neck is ***.  Cardiovascular: Normal rate and rhythm. Respiratory: Clear to auscultation bilaterally. Chest expansion symmetric without accessory muscle use; breathing non-labored.  GI: Abdomen soft and round. Non-tender, non-distended. Bowel sounds normoactive.  GU: Deferred.   Neuro: No focal deficits. Steady gait.   Psych: Normal mood and affect for situation. Extremities: No edema.  Skin: Warm and dry.    LABORATORY DATA:  None at this visit.***  DIAGNOSTIC IMAGING:  None at this visit.    ASSESSMENT & PLAN:  Ms. Garzon is a pleasant 61 y.o. female/female*** with history of ***, diagnosed in ***(date);  treated with ***; completed treatment on (date).  Patient presents to survivorship clinic today for routine follow-up after finishing treatment.   1. Cancer***:  Ms. Mckercher is clinically without evidence of disease or recurrence on physical exam today.     #. Nutritional status: Ms. Chessor reports that she is currently able to consume adequate nutrition by mouth/via tube***.  Weight is stable at *** lbs today.  Encouraged to continue to consume adequate hydration and nutrition, as tolerated.    #. At risk for dysphagia: Given Ms. Tufo's treatment for *** cancer, which included surgery and radiation therapy***, she is at risk for chronic dysphagia.  she reports having difficulty with breads and meats, but is able to consume soft foods and liquids without difficulty.  I encouraged her to continue to perform the swallowing exercises, as directed by Garald Balding, SLP.  If Ms. Gronewold requires further swallowing or speech therapy evaluation, I will be happy to place that referral, if needed.  Currently, the patient's reported swallowing concerns are to be expected and stable.     #.  At risk for neck lymphedema:  When patients with head & neck cancers are treated with surgery and/or radiation therapy, there is an associated increased risk of neck lymphedema.  Ms. Climer reports that currently she is experiencing what would be considered mild symptoms. she does/does not*** have a neck compression garment and reports wearing/not wearing*** it, as directed.  I encouraged Ms. Grover to continue to wear the compression garment and practice the massage techniques to reduce the presence of lymphedema.  If her symptoms worsen, I would happy to place a formal referral to physical therapy for further evaluation and treatment.     #.  At risk for hypothyroidism: The thyroid gland is often affected after treatment for head & neck cancer.  Ms. Crumby most recent TSH was normal at *** on (date).  If appropriate, she will be prescribed thyroid supplement with Levothyroxine. We discussed that she will continue to have serial TSH monitoring for at least the next 5 years as part of her routine follow-up and post-cancer treatment care.   #.  At risk for tooth decay/dental concerns: After treatment with radiation for head & neck cancers, patients often experience xerostomia which increases their risk of dental caries. Ms. Carucci was encouraged to see his dentist 3-4 times per year. The patient should also continue to use her fluoride trays daily, as directed by dentistry.  I encouraged her to reach out to either Dr. Enrique Sack (oncology dentist) or her primary care dentist with additional questions or concerns.   #.  Lung cancer screening:  [facility] now offers eligible patients lung cancer screening with a low-dose chest CT to aid in early detection, provide more effective treatment options, and ultimately improve survival benefits for patients diagnosed early.  Below is the selection criteria for screening:  . Medicare patients: 55-77 years; privately insured patients 55-80  years. . Active or former smokers who have quit within the last 15 years. . 30+ pack-year history of smoking  . Exclusion criteria - No signs/symptoms of lung cancer (i.e., no recent history of hemoptysis and no unexplained weight loss >15 pounds in the last 6 months). . Willing and healthy enough to undergo biopsies/surgery if needed.  Ms. Garda currently does/does not*** meet criteria for lung cancer screening.  Therefore, I have/have not*** placed a referral for screening today.    #. Tobacco & alcohol use: Ms. Wirick reports that she quit smoking in *** and continues to abstain from all tobacco products.  I congratulated her continued efforts to remain tobacco free.  I also reinforced the importance of avoiding alcohol consumption as well.  Both tobacco and alcohol use in patients with head & neck cancer increases the risk of recurrence.  They also increase the risk of other cancers, as well.  Ms. Dubbs states that she voiced understanding of the importance of continuing to remain both tobacco and alcohol-free.    #. Health maintenance and wellness promotion: Cancer patients who consume a diet rich in fruits and vegetables have better overall health and decreased risk of cancer recurrence. Ms. Kapoor was encouraged to consume 5-7 servings of fruits and vegetables per day, as tolerated. Ms. Steensma was also encouraged to engage in moderate to vigorous exercise for 30 minutes per day most days of the week.   #. Support services/counseling: It is not uncommon for this period of the patient's cancer care trajectory to be one of many emotions and stressors.  We discussed an opportunity for her to participate in the next session of Head & Neck FYNN ("Finding Your New Normal") support group series, designed for patients after they have completed treatment.   Ms. Sunny was encouraged to take advantage of our many other support services programs, support groups, and/or  counseling in coping with her new life as a cancer survivor after completing anti-cancer treatment. The patient was offered support today through active listening and expressive supportive counseling.     Dispo:  -See Dr. [name] (ENT) in ***/2017 -Return to cancer center to see Dr. Isidore Moos in ***/2017 -Return to cancer center to see Survivorship NP in ***.      A total of *** minutes was spent in the face-to-face care of this patient, with greater than 50% of that time spent in counseling and care-coordination.    Charlestine Massed, NP [place] [phone]

## 2016-08-19 ENCOUNTER — Telehealth: Payer: Self-pay | Admitting: *Deleted

## 2016-08-19 NOTE — Telephone Encounter (Signed)
Prior Authorization received from [place] for Losartan 100 mg. PA approved via [employer] [place] [employer]. Approval number: [id number]. Derl Barrow, RN

## 2016-09-11 ENCOUNTER — Other Ambulatory Visit: Payer: Self-pay | Admitting: Obstetrics and Gynecology

## 2016-10-14 ENCOUNTER — Ambulatory Visit: Payer: Self-pay | Admitting: Otolaryngology

## 2016-10-14 NOTE — Pre-Procedure Instructions (Signed)
[place]  10/14/2016      [place], [place] - [name] [place] [address] Phone: [phone] Fax: [phone]    Your procedure is scheduled on May 8 at 1215 PM.  Report to [facility] at 1015 AM.  Call this number if you have problems the morning of [phone]   Remember:  Do not eat food or drink liquids after midnight.  Take these medicines the morning of surgery with A SIP OF WATER albuterol (proventil HFA) inhaler, dulera inhaler, spriva inhaler,  <bring inhalers with you>, buproprion (wellbutrin), cyclobenzaprine (flexeril), loratadine (claritin), gabapentin (neurontin), levothyroxine (synthroid), metoprolol (lopressor), omeprazole (prilosec).   7 days prior to surgery STOP taking any Aspirin, Aleve, Naproxen, Ibuprofen, Motrin, Advil, Goody's, BC's, all herbal medications, fish oil, and all vitamins   Do not wear jewelry, make-up or nail polish.  Do not wear lotions, powders, or perfumes, or deoderant.  Do not shave 48 hours prior to surgery.   Do not bring valuables to the hospital.  [facility] is not responsible for any belongings or valuables.  Contacts, dentures or bridgework may not be worn into surgery.  Leave your suitcase in the car.  After surgery it may be brought to your room.  For patients admitted to the hospital, discharge time will be determined by your treatment team.  Patients discharged the day of surgery will not be allowed to drive home.    Special instructions:   [facility]- Preparing For Surgery  Before surgery, you can play an important role. Because skin is not sterile, your skin needs to be as free of germs as possible. You can reduce the number of germs on your skin by washing with CHG (chlorahexidine gluconate) Soap before surgery.  CHG is an antiseptic cleaner which kills germs and bonds with the skin to continue killing germs even  after washing.  Please do not use if you have an allergy to CHG or antibacterial soaps. If your skin becomes reddened/irritated stop using the CHG.  Do not shave (including legs and underarms) for at least 48 hours prior to first CHG shower. It is OK to shave your face.  Please follow these instructions carefully.   1. Shower the NIGHT BEFORE SURGERY and the MORNING OF SURGERY with CHG.   2. If you chose to wash your hair, wash your hair first as usual with your normal shampoo.  3. After you shampoo, rinse your hair and body thoroughly to remove the shampoo.  4. Use CHG as you would any other liquid soap. You can apply CHG directly to the skin and wash gently with a scrungie or a clean washcloth.   5. Apply the CHG Soap to your body ONLY FROM THE NECK DOWN.  Do not use on open wounds or open sores. Avoid contact with your eyes, ears, mouth and genitals (private parts). Wash genitals (private parts) with your normal soap.  6. Wash thoroughly, paying special attention to the area where your surgery will be performed.  7. Thoroughly rinse your body with warm water from the neck down.  8. DO NOT shower/wash with your normal soap after using and rinsing off the CHG Soap.  9. Pat yourself dry with a CLEAN TOWEL.   10. Wear CLEAN PAJAMAS   11. Place CLEAN SHEETS on your bed the night of your first shower and DO NOT SLEEP WITH PETS.    Day of Surgery:  Do not apply any deodorants/lotions. Please wear clean clothes to the hospital/surgery center.     Please read over the following fact sheets that you were given. Pain Booklet, Coughing and Deep Breathing and Surgical Site Infection Prevention

## 2016-10-15 ENCOUNTER — Encounter (HOSPITAL_COMMUNITY)
Admission: RE | Admit: 2016-10-15 | Discharge: 2016-10-15 | Disposition: A | Discharge status: Home / Self Care | Payer: Medicaid Other | Source: Ambulatory Visit | Attending: Otolaryngology | Admitting: Otolaryngology

## 2016-10-15 ENCOUNTER — Encounter (HOSPITAL_COMMUNITY): Payer: Self-pay

## 2016-10-15 DIAGNOSIS — Z01812 Encounter for preprocedural laboratory examination: Secondary | ICD-10-CM | POA: Insufficient documentation

## 2016-10-15 LAB — BASIC METABOLIC PANEL
Anion gap: 9 (ref 5–15)
BUN: 18 mg/dL (ref 6–20)
CO2: 24 mmol/L (ref 22–32)
Calcium: 9.2 mg/dL (ref 8.9–10.3)
Chloride: 106 mmol/L (ref 101–111)
Creatinine, Ser: 1.1 mg/dL — ABNORMAL HIGH (ref 0.44–1.00)
GFR calc Af Amer: >60 mL/min (ref >60)
GFR, EST NON AFRICAN AMERICAN: 54 mL/min — AB (ref >60)
Glucose, Bld: 113 mg/dL — ABNORMAL HIGH (ref 65–99)
POTASSIUM: 3.4 mmol/L — AB (ref 3.5–5.1)
SODIUM: 139 mmol/L (ref 135–145)

## 2016-10-15 LAB — CBC
HEMATOCRIT: 35.7 % — AB (ref 36.0–46.0)
Hemoglobin: 11.7 g/dL — ABNORMAL LOW (ref 12.0–15.0)
MCH: 29.8 pg (ref 26.0–34.0)
MCHC: 32.8 g/dL (ref 30.0–36.0)
MCV: 90.8 fL (ref 78.0–100.0)
Platelets: 283 10*3/uL (ref 150–400)
RBC: 3.93 MIL/uL (ref 3.87–5.11)
RDW: 13.5 % (ref 11.5–15.5)
WBC: 4.2 10*3/uL (ref 4.0–10.5)

## 2016-10-15 NOTE — Progress Notes (Signed)
Saw a cardiologist many years ago does not see one now. Denies any chest pain or discomfort  PCP Luiz Blare DO

## 2016-10-20 NOTE — H&P (Signed)
Deborah [name] is an 61 y.o. female.   Chief Complaint: stomal stenosis HPI: past history of total laryngectomy and radiation therapy. Stoma has been shrinking and getting more difficult for her to breathe.  Past Medical History:  Diagnosis Date  . Anemia   . Arthritis    in knees  . Asthma   . Bronchitis   . COPD (chronic obstructive pulmonary disease) ([place])   . Diverticulosis 11/15/2012    noted on screening colonoscopy   . Dyspnea    with COPD exerbation  . Esophageal stricture   . Former smoker 03/19/2011  . GERD (gastroesophageal reflux disease)   . Heart murmur    asymptomatic   . History of laryngectomy   . 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 Hypertension   . 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 Internal hemorrhoid 11/15/2012    small, noted on screening colonoscopy   . 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  . Leukocytopenia   . Nausea alone 07/28/2013  . Neck pain 01/21/2012  . Normal MRI 07/14/11   negative for mestasis   . Pneumonia 2012  . Sciatica   . Seizures ([place])    07/24/11 off Effexor w/o seizure  . Sepsis ([place]) 08/04/12  . Sinusitis, chronic 07/20/2011  . Sinusitis, chronic 07/20/2011   Bilateral maxillary, identified on MRI of head 07/14/11.    . Tracheostomy dependent [facility])     Past Surgical History:  Procedure Laterality Date  . COLONOSCOPY N/A 11/15/2012   Procedure: COLONOSCOPY;  Surgeon: Lafayette Dragon, MD;  Location: [facility];  Service: Endoscopy;  Laterality: N/A;  . DENTAL RESTORATION/EXTRACTION WITH X-RAY    . ESOPHAGOSCOPY  06/21/2012   Procedure: ESOPHAGOSCOPY;  Surgeon: [name], MD;  Location: [facility];  Service: ENT;  Laterality: N/A;  . ESOPHAGOSCOPY WITH DILITATION N/A 09/21/2014   Procedure: ESOPHAGOSCOPY WITH DILITATION;  Surgeon: [name], MD;  Location: [facility];  Service: ENT;  Laterality: N/A;  . ESOPHAGOSCOPY WITH DILITATION N/A  07/04/2016   Procedure: ESOPHAGOSCOPY WITH DILITATION;  Surgeon: [name], MD;  Location: [facility];  Service: ENT;  Laterality: N/A;  . FOREIGN BODY REMOVAL BRONCHIAL  10/02/2011   Procedure: REMOVAL FOREIGN BODY BRONCHIAL;  Surgeon: Ruby Cola, MD;  Location: [facility];  Service: ENT;  Laterality: N/A;  . LARYNGECTOMY    . Porta cath removal    . PORTACATH PLACEMENT  09/17/10   Tip in cavoatrial junction  . TRACHEAL DILITATION  07/16/2011   Procedure: TRACHEAL DILITATION;  Surgeon: Beckie Salts, MD;  Location: [facility];  Service: ENT;  Laterality: N/A;  dilation of tracheal stoma and replacement of stoma tube  . TUBAL LIGATION  1982    Family History  Problem Relation Age of Onset  . Heart disease Mother   . Heart disease Father   . Heart disease Sister    Social History:  reports that she quit smoking about 6 years ago. Her smoking use included Cigarettes. She has a 40.00 pack-year smoking history. She has never used smokeless tobacco. She reports that she does not drink alcohol or use drugs.  Allergies:  Allergies  Allergen Reactions  . Lisinopril Cough    No prescriptions prior to admission.    No results found for this or any previous visit (from the past 48 hour(s)). No results found.  ROS: otherwise negative  There were no vitals taken  for this visit.  PHYSICAL EXAM: Overall appearance:  Healthy appearing, in no distress Head:  Normocephalic, atraumatic. Ears: External auditory canals are clear; tympanic membranes are intact and the middle ears are free of any effusion. Nose: External nose is healthy in appearance. Internal nasal exam free of any lesions or obstruction. Oral Cavity/pharynx:  There are no mucosal lesions or masses identified. Neuro:  No identifiable neurologic deficits. Neck: No palpable neck masses. Fibrotic neck. Tracheal stoma is narrowed and very dry.  Studies Reviewed: none    Assessment/Plan Proceed with stomal plasty.  Delainie Chavana,  Adewale Pucillo 10/20/2016, 11:13 AM

## 2016-10-21 ENCOUNTER — Encounter (HOSPITAL_COMMUNITY)
Admission: RE | Disposition: A | Discharge status: Home / Self Care | Payer: Self-pay | Source: Ambulatory Visit | Attending: Otolaryngology

## 2016-10-21 ENCOUNTER — Ambulatory Visit (HOSPITAL_COMMUNITY): Payer: Medicaid Other | Admitting: Anesthesiology

## 2016-10-21 ENCOUNTER — Ambulatory Visit (HOSPITAL_COMMUNITY)
Admission: RE | Admit: 2016-10-21 | Discharge: 2016-10-21 | Disposition: A | Discharge status: Home / Self Care | Payer: Medicaid Other | Source: Ambulatory Visit | Attending: Otolaryngology | Admitting: Otolaryngology

## 2016-10-21 ENCOUNTER — Encounter (HOSPITAL_COMMUNITY): Payer: Self-pay | Admitting: *Deleted

## 2016-10-21 DIAGNOSIS — Z87891 Personal history of nicotine dependence: Secondary | ICD-10-CM | POA: Diagnosis not present

## 2016-10-21 DIAGNOSIS — Z9002 Acquired absence of larynx: Secondary | ICD-10-CM | POA: Diagnosis not present

## 2016-10-21 DIAGNOSIS — K648 Other hemorrhoids: Secondary | ICD-10-CM | POA: Diagnosis not present

## 2016-10-21 DIAGNOSIS — K5790 Diverticulosis of intestine, part unspecified, without perforation or abscess without bleeding: Secondary | ICD-10-CM | POA: Diagnosis not present

## 2016-10-21 DIAGNOSIS — M170 Bilateral primary osteoarthritis of knee: Secondary | ICD-10-CM | POA: Diagnosis not present

## 2016-10-21 DIAGNOSIS — K219 Gastro-esophageal reflux disease without esophagitis: Secondary | ICD-10-CM | POA: Diagnosis not present

## 2016-10-21 DIAGNOSIS — Z8669 Personal history of other diseases of the nervous system and sense organs: Secondary | ICD-10-CM | POA: Insufficient documentation

## 2016-10-21 DIAGNOSIS — Z8249 Family history of ischemic heart disease and other diseases of the circulatory system: Secondary | ICD-10-CM | POA: Diagnosis not present

## 2016-10-21 DIAGNOSIS — Z79899 Other long term (current) drug therapy: Secondary | ICD-10-CM | POA: Diagnosis not present

## 2016-10-21 DIAGNOSIS — Z7951 Long term (current) use of inhaled steroids: Secondary | ICD-10-CM | POA: Diagnosis not present

## 2016-10-21 DIAGNOSIS — I10 Essential (primary) hypertension: Secondary | ICD-10-CM | POA: Insufficient documentation

## 2016-10-21 DIAGNOSIS — Z8521 Personal history of malignant neoplasm of larynx: Secondary | ICD-10-CM | POA: Diagnosis not present

## 2016-10-21 DIAGNOSIS — Z923 Personal history of irradiation: Secondary | ICD-10-CM | POA: Diagnosis not present

## 2016-10-21 DIAGNOSIS — Z9889 Other specified postprocedural states: Secondary | ICD-10-CM | POA: Diagnosis not present

## 2016-10-21 DIAGNOSIS — Z888 Allergy status to other drugs, medicaments and biological substances status: Secondary | ICD-10-CM | POA: Diagnosis not present

## 2016-10-21 DIAGNOSIS — J449 Chronic obstructive pulmonary disease, unspecified: Secondary | ICD-10-CM | POA: Diagnosis not present

## 2016-10-21 DIAGNOSIS — J9503 Malfunction of tracheostomy stoma: Secondary | ICD-10-CM | POA: Insufficient documentation

## 2016-10-21 DIAGNOSIS — E890 Postprocedural hypothyroidism: Secondary | ICD-10-CM | POA: Diagnosis not present

## 2016-10-21 HISTORY — PX: STOMAPLASTY: SHX5219

## 2016-10-21 SURGERY — STOMAPLASTY
Anesthesia: General

## 2016-10-21 MED ORDER — ONDANSETRON HCL 4 MG/2ML IJ SOLN
INTRAMUSCULAR | Status: DC | PRN
  Administered 2016-10-21: 4 mg via INTRAVENOUS

## 2016-10-21 MED ORDER — FENTANYL CITRATE (PF) 100 MCG/2ML IJ SOLN: 25.0000 ug | INTRAMUSCULAR | Status: DC | PRN

## 2016-10-21 MED ORDER — PROMETHAZINE HCL 25 MG RE SUPP: 25.0000 mg | Freq: Four times a day (QID) | RECTAL | 1 refills | Status: DC | PRN

## 2016-10-21 MED ORDER — HYDROCODONE-ACETAMINOPHEN 7.5-325 MG PO TABS: 1.0000 | ORAL_TABLET | Freq: Four times a day (QID) | ORAL | 0 refills | Status: DC | PRN

## 2016-10-21 MED ORDER — EPHEDRINE SULFATE 50 MG/ML IJ SOLN
INTRAMUSCULAR | Status: DC | PRN
  Administered 2016-10-21 (×2): 10 mg via INTRAVENOUS

## 2016-10-21 MED ORDER — MIDAZOLAM HCL 5 MG/5ML IJ SOLN
INTRAMUSCULAR | Status: DC | PRN
  Administered 2016-10-21: 2 mg via INTRAVENOUS

## 2016-10-21 MED ORDER — LIDOCAINE HCL (CARDIAC) 20 MG/ML IV SOLN
INTRAVENOUS | Status: DC | PRN
  Administered 2016-10-21: 40 mg via INTRATRACHEAL
  Administered 2016-10-21: 60 mg via INTRATRACHEAL

## 2016-10-21 MED ORDER — LACTATED RINGERS IV SOLN
INTRAVENOUS | Status: DC | PRN
  Administered 2016-10-21: 13:00:00 via INTRAVENOUS

## 2016-10-21 MED ORDER — LIDOCAINE-EPINEPHRINE 1 %-1:100000 IJ SOLN
INTRAMUSCULAR | Status: AC
  Filled 2016-10-21: qty 1

## 2016-10-21 MED ORDER — BACITRACIN ZINC 500 UNIT/GM EX OINT
TOPICAL_OINTMENT | CUTANEOUS | Status: AC
  Filled 2016-10-21: qty 28.35

## 2016-10-21 MED ORDER — SILVER NITRATE-POT NITRATE 75-25 % EX MISC
1.0000 "application " | CUTANEOUS | Status: DC
  Filled 2016-10-21: qty 1

## 2016-10-21 MED ORDER — FENTANYL CITRATE (PF) 250 MCG/5ML IJ SOLN
INTRAMUSCULAR | Status: AC
  Filled 2016-10-21: qty 5

## 2016-10-21 MED ORDER — CEFAZOLIN SODIUM-DEXTROSE 2-4 GM/100ML-% IV SOLN
2.0000 g | INTRAVENOUS | Status: AC
  Administered 2016-10-21: 2 g via INTRAVENOUS
  Filled 2016-10-21: qty 100

## 2016-10-21 MED ORDER — PROPOFOL 10 MG/ML IV BOLUS
INTRAVENOUS | Status: DC | PRN
  Administered 2016-10-21: 120 mg via INTRAVENOUS

## 2016-10-21 MED ORDER — LACTATED RINGERS IV SOLN
INTRAVENOUS | Status: DC
  Administered 2016-10-21: 10:00:00 via INTRAVENOUS

## 2016-10-21 MED ORDER — MIDAZOLAM HCL 2 MG/2ML IJ SOLN
INTRAMUSCULAR | Status: AC
  Filled 2016-10-21: qty 2

## 2016-10-21 MED ORDER — SUGAMMADEX SODIUM 200 MG/2ML IV SOLN
INTRAVENOUS | Status: DC | PRN
  Administered 2016-10-21: 200 mg via INTRAVENOUS

## 2016-10-21 MED ORDER — BACITRACIN 500 UNIT/GM EX OINT
TOPICAL_OINTMENT | CUTANEOUS | Status: DC | PRN
  Administered 2016-10-21: 1 via TOPICAL

## 2016-10-21 MED ORDER — CEPHALEXIN 500 MG PO CAPS: 500.0000 mg | ORAL_CAPSULE | Freq: Three times a day (TID) | ORAL | 0 refills | Status: DC

## 2016-10-21 MED ORDER — LIDOCAINE-EPINEPHRINE 1 %-1:100000 IJ SOLN
INTRAMUSCULAR | Status: DC | PRN
  Administered 2016-10-21: 20 mL

## 2016-10-21 MED ORDER — ROCURONIUM BROMIDE 100 MG/10ML IV SOLN
INTRAVENOUS | Status: DC | PRN
  Administered 2016-10-21: 20 mg via INTRAVENOUS

## 2016-10-21 MED ORDER — FENTANYL CITRATE (PF) 100 MCG/2ML IJ SOLN
INTRAMUSCULAR | Status: DC | PRN
  Administered 2016-10-21: 100 ug via INTRAVENOUS
  Administered 2016-10-21: 50 ug via INTRAVENOUS

## 2016-10-21 MED ORDER — 0.9 % SODIUM CHLORIDE (POUR BTL) OPTIME
TOPICAL | Status: DC | PRN
  Administered 2016-10-21: 1000 mL

## 2016-10-21 SURGICAL SUPPLY — 39 items
BENZOIN TINCTURE PRP APPL 2/3 (GAUZE/BANDAGES/DRESSINGS) IMPLANT
BLADE SURG 15 STRL LF DISP TIS (BLADE) IMPLANT
BLADE SURG 15 STRL SS (BLADE)
CANISTER SUCTION 1200CC (MISCELLANEOUS) ×2 IMPLANT
CATH ROBINSON RED A/P 10FR (CATHETERS) IMPLANT
CATH ROBINSON RED A/P 14FR (CATHETERS) IMPLANT
CATH ROBINSON RED A/P 16FR (CATHETERS) IMPLANT
CLEANER TIP ELECTROSURG 2X2 (MISCELLANEOUS) ×2 IMPLANT
COVER BACK TABLE 60X90IN (DRAPES) IMPLANT
COVER MAYO STAND STRL (DRAPES) ×2 IMPLANT
DECANTER SPIKE VIAL GLASS SM (MISCELLANEOUS) ×2 IMPLANT
DRAPE HALF SHEET 40X57 (DRAPES) IMPLANT
ELECT COATED BLADE 2.86 ST (ELECTRODE) IMPLANT
ELECT REM PT RETURN 9FT ADLT (ELECTROSURGICAL)
ELECTRODE REM PT RTRN 9FT ADLT (ELECTROSURGICAL) IMPLANT
GAUZE SPONGE 4X4 12PLY STRL (GAUZE/BANDAGES/DRESSINGS) ×2 IMPLANT
GLOVE BIOGEL PI IND STRL 7.0 (GLOVE) ×1 IMPLANT
GLOVE BIOGEL PI INDICATOR 7.0 (GLOVE) ×1
GLOVE ECLIPSE 7.5 STRL STRAW (GLOVE) ×2 IMPLANT
GOWN STRL REUS W/ TWL XL LVL3 (GOWN DISPOSABLE) ×1 IMPLANT
GOWN STRL REUS W/TWL XL LVL3 (GOWN DISPOSABLE) ×1
KIT BASIN OR (CUSTOM PROCEDURE TRAY) IMPLANT
NDL SAFETY ECLIPSE 18X1.5 (NEEDLE) IMPLANT
NEEDLE 27GX1/2 REG BEVEL ECLIP (NEEDLE) ×2 IMPLANT
NEEDLE HYPO 18GX1.5 SHARP (NEEDLE)
NEEDLE HYPO 25GX1X1/2 BEV (NEEDLE) ×2 IMPLANT
NEEDLE PRECISIONGLIDE 27X1.5 (NEEDLE) ×2 IMPLANT
PENCIL FOOT CONTROL (ELECTRODE) IMPLANT
STRIP CLOSURE SKIN 1/2X4 (GAUZE/BANDAGES/DRESSINGS) IMPLANT
SUCTION FRAZIER HANDLE 10FR (MISCELLANEOUS)
SUCTION TUBE FRAZIER 10FR DISP (MISCELLANEOUS) IMPLANT
SUT CHROMIC 3 0 SH 27 (SUTURE) ×6 IMPLANT
SUT ETHILON 2 0 PSLX (SUTURE) ×2 IMPLANT
SUT SILK 2 0 FS (SUTURE) IMPLANT
SYR CONTROL 10ML LL (SYRINGE) ×2 IMPLANT
TAPE PAPER 3X10 WHT MICROPORE (GAUZE/BANDAGES/DRESSINGS) IMPLANT
TOWEL OR 17X24 6PK STRL BLUE (TOWEL DISPOSABLE) ×2 IMPLANT
TUBE CONNECTING 20X1/4 (TUBING) ×2 IMPLANT
WATER STERILE IRR 1000ML POUR (IV SOLUTION) IMPLANT

## 2016-10-21 NOTE — Interval H&P Note (Signed)
History and Physical Interval Note:  10/21/2016 11:39 AM  Deborah [name]  has presented today for surgery, with the diagnosis of tracheal stoma stenosis  The various methods of treatment have been discussed with the patient and family. After consideration of risks, benefits and other options for treatment, the patient has consented to  Procedure(s): STOMAPLASTY (N/A) as a surgical intervention .  The patient's history has been reviewed, patient examined, no change in status, stable for surgery.  I have reviewed the patient's chart and labs.  Questions were answered to the patient's satisfaction.     Alicyn Klann

## 2016-10-21 NOTE — Anesthesia Preprocedure Evaluation (Addendum)
Anesthesia Evaluation  Patient identified by MRN, date of birth, ID band Patient awake    Reviewed: Allergy & Precautions, H&P , NPO status , Patient's Chart, lab work & pertinent test results  Airway Mallampati: III  TM Distance: >3 FB Neck ROM: Full    Dental no notable dental hx. (+) Edentulous Upper, Edentulous Lower, Dental Advisory Given   Pulmonary asthma , COPD,  COPD inhaler, former smoker,    Pulmonary exam normal breath sounds clear to auscultation       Cardiovascular hypertension, Pt. on medications and Pt. on home beta blockers + Valvular Problems/Murmurs AI  Rhythm:Regular Rate:Normal     Neuro/Psych Seizures -, Well Controlled,  Anxiety negative psych ROS   GI/Hepatic Neg liver ROS, GERD  Medicated and Controlled,  Endo/Other  Hypothyroidism   Renal/GU negative Renal ROS  negative genitourinary   Musculoskeletal  (+) Arthritis , Osteoarthritis,    Abdominal   Peds  Hematology negative hematology ROS (+) anemia ,   Anesthesia Other Findings   Reproductive/Obstetrics negative OB ROS                            Anesthesia Physical Anesthesia Plan  ASA: III  Anesthesia Plan: General   Post-op Pain Management:    Induction: Intravenous  Airway Management Planned: Tracheostomy  Additional Equipment:   Intra-op Plan:   Post-operative Plan: Extubation in OR  Informed Consent: I have reviewed the patients History and Physical, chart, labs and discussed the procedure including the risks, benefits and alternatives for the proposed anesthesia with the patient or authorized representative who has indicated his/her understanding and acceptance.   Dental advisory given  Plan Discussed with: CRNA  Anesthesia Plan Comments:         Anesthesia Quick Evaluation

## 2016-10-21 NOTE — Anesthesia Postprocedure Evaluation (Signed)
Anesthesia Post Note  Patient: Deborah [name]  Procedure(s) Performed: Procedure(s) (LRB): STOMAPLASTY (N/A)  Patient location during evaluation: PACU Anesthesia Type: General Level of consciousness: awake and alert Pain management: pain level controlled Vital Signs Assessment: post-procedure vital signs reviewed and stable Respiratory status: spontaneous breathing, nonlabored ventilation and respiratory function stable Cardiovascular status: blood pressure returned to baseline and stable Postop Assessment: no signs of nausea or vomiting Anesthetic complications: no       Last Vitals:  Vitals:   10/21/16 1445 10/21/16 1457  BP: 126/76 139/82  Pulse: 66 68  Resp: 14   Temp: 36.5 C     Last Pain:  Vitals:   10/21/16 1017  TempSrc: Oral                 Jemiah Cuadra,W. EDMOND

## 2016-10-21 NOTE — Discharge Instructions (Signed)
Apply antibiotic ointment all around the surgical site and inside the stoma twice daily. Keep the tube and as much issue want. Try to get a new tube as soon as possible as this one is all broken.

## 2016-10-21 NOTE — Op Note (Signed)
OPERATIVE REPORT  DATE OF SURGERY: 10/21/2016  PATIENT:  Deborah [name],  61 y.o. female  PRE-OPERATIVE DIAGNOSIS:  tracheal stoma stenosis  POST-OPERATIVE DIAGNOSIS:  tracheal stoma stenosis  PROCEDURE:  Procedure(s): STOMAPLASTY  SURGEON:  Beckie Salts, MD  ASSISTANTS: None  ANESTHESIA:   General   EBL:  25 ml  DRAINS: None  LOCAL MEDICATIONS USED:  1% Xylocaine with epinephrine  SPECIMEN:  none  COUNTS:  Correct  PROCEDURE DETAILS: The patient was taken to the operating room and placed on the operating table in the supine position. Following induction of general endotracheal anesthesia through the stoma with a 5.5 endotracheal tube, the neck was prepped and draped in a standard fashion. V to Y flaps were designed at 3:00 6:00 and 9:00. Marking pen was used. Local anesthetic was infiltrated along all the incisions. 15 scalpel was used to incise the skin. Skin was undermined to mobilize. The tip of the V was brought into the stoma as far as possible and secured in place with initial 3-0 chromic sutures. The entire length of the incisions were reapproximated with interrupted and running 3-0 chromic sutures. Attention was released from the stenotic segment. The trach itself was still very small. Nothing could be done about that. The patient was awakened from anesthesia, extubated and the stoma tube was replaced. The stoma tube itself was old and the slots for the Velcro straps were both broken. I was able to suture Velcro straps to the tube to secure it in place.    PATIENT DISPOSITION:  To PACU, stable

## 2016-10-21 NOTE — Transfer of Care (Signed)
Immediate Anesthesia Transfer of Care Note  Patient: Kristine Royal Berry  Procedure(s) Performed: Procedure(s): STOMAPLASTY (N/A)  Patient Location: PACU  Anesthesia Type:General  Level of Consciousness: awake and alert   Airway & Oxygen Therapy: Patient Spontanous Breathing and Patient connected to tracheostomy mask oxygen  Post-op Assessment: Report given to RN, Post -op Vital signs reviewed and stable and Patient moving all extremities  Post vital signs: Reviewed and stable  Last Vitals:  Vitals:   10/21/16 1017 10/21/16 1405  BP:  (!) 144/82  Pulse: 64 71  Resp: 18 12  Temp: 36.8 C 36.4 C    Last Pain:  Vitals:   10/21/16 1017  TempSrc: Oral         Complications: No apparent anesthesia complications

## 2016-10-21 NOTE — Anesthesia Procedure Notes (Signed)
Procedure Name: Intubation Date/Time: 10/21/2016 12:45 PM Performed by: Suzy Bouchard Pre-anesthesia Checklist: Patient identified, Emergency Drugs available, Suction available, Patient being monitored and Timeout performed Patient Re-evaluated:Patient Re-evaluated prior to inductionOxygen Delivery Method: Circle system utilized Preoxygenation: Pre-oxygenation with 100% oxygen Intubation Type: IV induction Tube size: 5.5 (reinforced) mm Number of attempts: 1 Airway Equipment and Method: Tracheostomy Placement Confirmation: positive ETCO2 and breath sounds checked- equal and bilateral Comments: 5.5 reinforced ETT placed easily via stoma after induction.

## 2016-10-22 ENCOUNTER — Encounter (HOSPITAL_COMMUNITY): Payer: Self-pay | Admitting: Otolaryngology

## 2016-10-22 NOTE — Addendum Note (Signed)
Addendum  created 10/22/16 1426 by Scheryl Darter, CRNA   Anesthesia Event edited

## 2016-10-23 ENCOUNTER — Other Ambulatory Visit: Payer: Self-pay | Admitting: Obstetrics and Gynecology

## 2016-10-28 ENCOUNTER — Other Ambulatory Visit: Payer: Self-pay | Admitting: Obstetrics and Gynecology

## 2016-10-28 NOTE — Telephone Encounter (Signed)
She needs her Benzonatate and Scopolamine filled, to [facility] on [address].  Thank you.

## 2016-10-30 MED ORDER — SCOPOLAMINE 1 MG/3DAYS TD PT72: 1.0000 | MEDICATED_PATCH | TRANSDERMAL | 0 refills | Status: DC

## 2016-10-30 MED ORDER — BENZONATATE 100 MG PO CAPS: ORAL_CAPSULE | ORAL | 0 refills | Status: DC

## 2016-10-31 ENCOUNTER — Encounter (HOSPITAL_COMMUNITY): Payer: Self-pay | Admitting: Otolaryngology

## 2016-10-31 NOTE — Addendum Note (Signed)
Addendum  created 10/31/16 1157 by Roderic Palau, MD   Anesthesia Event edited, Anesthesia Staff edited

## 2016-11-14 ENCOUNTER — Other Ambulatory Visit: Payer: Self-pay | Admitting: Obstetrics and Gynecology

## 2016-11-15 NOTE — Addendum Note (Signed)
Addendum  created 11/15/16 0824 by Cashae Weich, MD   Sign clinical note    

## 2017-01-14 ENCOUNTER — Other Ambulatory Visit: Payer: Self-pay | Admitting: Obstetrics and Gynecology

## 2017-01-14 ENCOUNTER — Encounter: Payer: Self-pay | Admitting: Family Medicine

## 2017-01-14 ENCOUNTER — Ambulatory Visit (INDEPENDENT_AMBULATORY_CARE_PROVIDER_SITE_OTHER): Payer: Medicaid Other | Admitting: Family Medicine

## 2017-01-14 VITALS — BP 140/90 | HR 88 | Temp 98.0°F | Wt 218.0 lb

## 2017-01-14 DIAGNOSIS — M5441 Lumbago with sciatica, right side: Secondary | ICD-10-CM

## 2017-01-14 MED ORDER — GABAPENTIN 300 MG PO CAPS: 300.0000 mg | ORAL_CAPSULE | Freq: Every day | ORAL | 3 refills | Status: DC

## 2017-01-14 NOTE — Patient Instructions (Signed)
It was great seeing you today! We have addressed the following issues today  1. Please increase our gabapentin to 300 mg 3 times a day. 2. I will do an X ray of your back and will discuss results yesterday.   If we did any lab work today, and the results require attention, either me or my nurse will get in touch with you. If everything is normal, you will get a letter in mail and a message via . If you don't hear from [place] in two weeks, please give [place] a call. Otherwise, we look forward to seeing you again at your next visit. If you have any questions or concerns before then, please call the clinic at [phone].  Please bring all your medications to every doctors visit  Sign up for My Chart to have easy access to your labs results, and communication with your Primary care physician. Please ask Front Desk for some assistance.   Please check-out at the front desk before leaving the clinic.    Take Care,   Dr. Andy Gauss

## 2017-01-14 NOTE — Telephone Encounter (Signed)
Please call patient and ask her to make a follow up appointment with me. I have not met her yet.

## 2017-01-15 ENCOUNTER — Ambulatory Visit (HOSPITAL_COMMUNITY)
Admission: RE | Admit: 2017-01-15 | Discharge: 2017-01-15 | Disposition: A | Discharge status: Home / Self Care | Payer: Medicaid Other | Source: Ambulatory Visit | Attending: Family Medicine | Admitting: Family Medicine

## 2017-01-15 ENCOUNTER — Other Ambulatory Visit: Payer: Self-pay | Admitting: *Deleted

## 2017-01-15 DIAGNOSIS — M5137 Other intervertebral disc degeneration, lumbosacral region: Secondary | ICD-10-CM | POA: Diagnosis not present

## 2017-01-15 DIAGNOSIS — M5441 Lumbago with sciatica, right side: Secondary | ICD-10-CM | POA: Diagnosis present

## 2017-01-15 DIAGNOSIS — M5136 Other intervertebral disc degeneration, lumbar region: Secondary | ICD-10-CM | POA: Insufficient documentation

## 2017-01-15 MED ORDER — SCOPOLAMINE 1 MG/3DAYS TD PT72: 1.0000 | MEDICATED_PATCH | TRANSDERMAL | 0 refills | Status: DC

## 2017-01-15 NOTE — Progress Notes (Signed)
Date of Visit: 01/14/2017   HPI:  Patient presents for a same day appointment to discuss low back pain. Patient reports right sided lumbar pain for the past few days radiating down to her foot. This is a chronic problem, patient reports being diagnosed with sciatica in the past. She reports getting steroid shots in her knees and hips in the past and would like to have another injection in her knees. She has been taking ibuprofen for pain with little relief in her symptoms. Patient also reports being started on flexeril and gabapentin and reports no improvement with these additions. She denies any urinary or bowel incontinence.    ROS: See HPI  [place]:  Past Medical History:  Diagnosis Date  . Anemia   . Arthritis    in knees  . Asthma   . Bronchitis   . COPD (chronic obstructive pulmonary disease) ([place])   . Diverticulosis 11/15/2012    noted on screening colonoscopy   . Dyspnea    with COPD exerbation  . Esophageal stricture   . Former smoker 03/19/2011  . GERD (gastroesophageal reflux disease)   . Heart murmur    asymptomatic   . History of laryngectomy   . 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 Hypertension   . 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 Internal hemorrhoid 11/15/2012    small, noted on screening colonoscopy   . 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  . Leukocytopenia   . Nausea alone 07/28/2013  . Neck pain 01/21/2012  . Normal MRI 07/14/11   negative for mestasis   . Pneumonia 2012  . Sciatica   . Seizures ([place])    07/24/11 off Effexor w/o seizure  . Sepsis ([place]) 08/04/12  . Sinusitis, chronic 07/20/2011  . Sinusitis, chronic 07/20/2011   Bilateral maxillary, identified on MRI of head 07/14/11.    . 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 2+ radial and PT pulses bilaterally Respiratory: CTAB, normal effort, No wheezes, rales or rhonchi Abdomen: soft, nontender, nondistended, no hepatic or splenomegaly, +BS MSK: Lumbosacral spine area reveals no local tenderness or mass.decrease ROM noted. Straight leg raise is normal. DTR's, motor strength and sensation normal, including heel and toe gait.  Peripheral pulses are palpable. Extremities: no edema or cyanosis. WWP. Skin: warm and dry, no rashes noted Neuro: alert and oriented x4, no focal deficits Psych: Normal affect and mood   ASSESSMENT/PLAN:  #Lumbar pain, chronic Patient with long history of lumbar pain, with radiation to right leg. NSAIDs have provided minimal relief. No prior imaging of lumbar spine, diagnosis appeared to be clinical. Imaging would help accurately assess any anatomic changes contributing to patient symptoms. Will increase gabapentin to 900 mg daily from 300 mg. Will reassess for tolerance. Discontinue muscle relaxer as symptoms appears to be more consistent with radiculopathy and patient is currently already on gabapentin with both agents being on the beers list.  --DG Lumbar spine complete --Increase gabapentin to 300 mg tid --NSAIDs as needed for pain  --Follow up with PCP   FOLLOW UP: Follow up in 1-2 weeks for X ray results and reevaluation  of pain  Marjie Skiff, MD [place]

## 2017-01-16 NOTE — Telephone Encounter (Signed)
appt made on 02/10/17. Deborah [name], Salome Spotted, CMA

## 2017-01-19 ENCOUNTER — Other Ambulatory Visit: Payer: Self-pay | Admitting: Family Medicine

## 2017-01-19 ENCOUNTER — Other Ambulatory Visit: Payer: Self-pay | Admitting: Obstetrics and Gynecology

## 2017-01-29 ENCOUNTER — Encounter (HOSPITAL_COMMUNITY): Payer: Self-pay | Admitting: Emergency Medicine

## 2017-01-29 ENCOUNTER — Emergency Department (HOSPITAL_COMMUNITY)
Admission: EM | Admit: 2017-01-29 | Discharge: 2017-01-29 | Disposition: A | Discharge status: Home / Self Care | Payer: Medicaid Other | Attending: Emergency Medicine | Admitting: Emergency Medicine

## 2017-01-29 DIAGNOSIS — Z5321 Procedure and treatment not carried out due to patient leaving prior to being seen by health care provider: Secondary | ICD-10-CM | POA: Diagnosis not present

## 2017-01-29 DIAGNOSIS — M545 Low back pain: Secondary | ICD-10-CM | POA: Diagnosis present

## 2017-01-29 NOTE — ED Triage Notes (Signed)
Patient here from home with complaints of lower back pain. Reports being seen by PCP in which they increased her gabapentin to 3 times daily, no relief. Also reports nausea/vomiting, unable to keep down food and liquids.

## 2017-01-29 NOTE — ED Notes (Signed)
Patient reports that she made a dr appointment while sitting out in lobby and will go to that appointment and does not want to be seen in the ER.

## 2017-01-30 ENCOUNTER — Ambulatory Visit (INDEPENDENT_AMBULATORY_CARE_PROVIDER_SITE_OTHER): Payer: Medicaid Other | Admitting: Family Medicine

## 2017-01-30 ENCOUNTER — Encounter: Payer: Self-pay | Admitting: Family Medicine

## 2017-01-30 VITALS — BP 124/78 | HR 86 | Temp 98.6°F | Ht 67.0 in | Wt 222.0 lb

## 2017-01-30 DIAGNOSIS — M5441 Lumbago with sciatica, right side: Secondary | ICD-10-CM

## 2017-01-30 MED ORDER — NAPROXEN 500 MG PO TABS: 500.0000 mg | ORAL_TABLET | Freq: Two times a day (BID) | ORAL | 0 refills | Status: DC

## 2017-01-30 MED ORDER — PREGABALIN 75 MG PO CAPS: 75.0000 mg | ORAL_CAPSULE | Freq: Two times a day (BID) | ORAL | 0 refills | Status: DC

## 2017-01-30 NOTE — Patient Instructions (Signed)
It was great seeing you today! We have addressed the following issues today  1. I want you to stop taking the gabapentin and ibuprofen since I will start two new similar medications. Aswe discussed, your lower back X ray showed degenerative changes causing pain that you have been experiencing. 2. I will start you on Lyrica for your back/neuropathic pain and will add naproxen. 3. Follow up in two weeks with your primary care provider  If we did any lab work today, and the results require attention, either me or my nurse will get in touch with you. If everything is normal, you will get a letter in mail and a message via . If you don't hear from [place] in two weeks, please give [place] a call. Otherwise, we look forward to seeing you again at your next visit. If you have any questions or concerns before then, please call the clinic at [phone].  Please bring all your medications to every doctors visit  Sign up for My Chart to have easy access to your labs results, and communication with your Primary care physician. Please ask Front Desk for some assistance.   Please check-out at the front desk before leaving the clinic.    Take Care,   Dr. Andy Gauss

## 2017-01-30 NOTE — Progress Notes (Signed)
Subjective:    Patient ID: Deborah [name], female    DOB: 03-15-1956, 61 y.o.   MRN: [id number]   CC: right sided low back pain  HPI: Patient is 61 yo female who presents today complaining of right sided low back pain radiating down her right leg. Patient was recently seen for similar problem and had an lumber Xray in the interval. Patient was instructed to increase gabapentin to 300 mg tid but reports sleepiness with dose increase.Patient reports that ibuprofen as minimally improve her symptoms.Patinent denies any urinary and bowel incontinence or any saddle anesthesia.  Smoking status reviewed   ROS: all other systems were reviewed and are negative other than in the HPI   Past Medical History:  Diagnosis Date  . Anemia   . Arthritis    in knees  . Asthma   . Bronchitis   . COPD (chronic obstructive pulmonary disease) ([place])   . Diverticulosis 11/15/2012    noted on screening colonoscopy   . Dyspnea    with COPD exerbation  . Esophageal stricture   . Former smoker 03/19/2011  . GERD (gastroesophageal reflux disease)   . Heart murmur    asymptomatic   . History of laryngectomy   . 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 Hypertension   . 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 Internal hemorrhoid 11/15/2012    small, noted on screening colonoscopy   . 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  . Leukocytopenia   . Nausea alone 07/28/2013  . Neck pain 01/21/2012  . Normal MRI 07/14/11   negative for mestasis   . Pneumonia 2012  . Sciatica   . Seizures ([place])    07/24/11 off Effexor w/o seizure  . Sepsis ([place]) 08/04/12  . Sinusitis, chronic 07/20/2011  . Sinusitis, chronic 07/20/2011   Bilateral maxillary, identified on MRI of head 07/14/11.    . Tracheostomy dependent [facility])     Past Surgical History:  Procedure Laterality Date  . COLONOSCOPY N/A 11/15/2012   Procedure: COLONOSCOPY;  Surgeon: Lafayette Dragon, MD;  [facility];  Service: Endoscopy;  Laterality: N/A;  . DENTAL RESTORATION/EXTRACTION WITH X-RAY    . ESOPHAGOSCOPY  06/21/2012   Procedure: ESOPHAGOSCOPY;  Surgeon: [name], MD;  Location: [place];  Service: ENT;  Laterality: N/A;  . ESOPHAGOSCOPY WITH DILITATION N/A 09/21/2014   Procedure: ESOPHAGOSCOPY WITH DILITATION;  Surgeon: [name], MD;  Location: [place];  Service: ENT;  Laterality: N/A;  . ESOPHAGOSCOPY WITH DILITATION N/A 07/04/2016   Procedure: ESOPHAGOSCOPY WITH DILITATION;  Surgeon: [name], MD;  Location: [place];  Service: ENT;  Laterality: N/A;  . FOREIGN BODY REMOVAL BRONCHIAL  10/02/2011   Procedure: REMOVAL FOREIGN BODY BRONCHIAL;  Surgeon: Ruby Cola, MD;  Location: [place];  Service: ENT;  Laterality: N/A;  . LARYNGECTOMY    . Porta cath removal    . PORTACATH PLACEMENT  09/17/10   Tip in cavoatrial junction  . STOMAPLASTY N/A 10/21/2016   Procedure: Zola Button;  Surgeon: [name], MD;  Location: [place];  Service: ENT;  Laterality: N/A;  . TRACHEAL DILITATION  07/16/2011   Procedure: TRACHEAL DILITATION;  Surgeon: Beckie Salts, MD;  Location: [place];  Service: ENT;  Laterality: N/A;  dilation of tracheal stoma and replacement of stoma tube  . TUBAL LIGATION  1982    Past medical history, surgical, family, and social  history reviewed and updated in the EMR as appropriate.  Objective:  BP 124/78   Pulse 86   Temp 98.6 F (37 C) (Oral)   Ht 5\' 7"  (1.702 m)   Wt 222 lb (100.7 kg)   SpO2 97%   BMI 34.77 kg/m   Vitals and nursing note reviewed  General: NAD, pleasant, able to participate in exam Cardiac: RRR, normal heart sounds, no murmurs. 2+ radial and PT pulses bilaterally Respiratory: CTAB, normal effort, No wheezes, rales or rhonchi Abdomen: soft, nontender, nondistended, no hepatic or splenomegaly, +BS MSK: Lumbosacral spine area reveals no local tenderness or mass.decrease ROM noted. Straight leg raise is  normal. DTR's, motor strength and sensation normal, including heel and toe gait.  Peripheral pulses are palpable. Extremities: no edema or cyanosis. WWP. Skin: warm and dry, no rashes noted Neuro: alert and oriented x4, no focal deficits Psych: Normal affect and mood   Assessment & Plan:   #Chronic Lumbar pain, stable Since last office visit patient has had a lumbar Xray which showed progression of degenerative disease in the lumbar sacral region with spurring and loss of joint space as well as sclerosis. Neuropathic pain is consistent with imaging findings. There are no red flags on exam, and patient appears to be at baseline. She has exhibited minimal tolerance in her dose increase of gabapentin with minimal change in pain level with gabapentin. Will modify current treatment plan (see below) and have patient follow up with PCP. --Discontinue gabapentin 300 mg tid. --Start patient on Lyrica 75 mg bid could increase to 150 mg bid in wk 2. --Start patient on naproxen 500 mg as needed. --Discontinue Ibuprofen with patient starting another NSAIDs. --Patient will follow up with PCP in about 2 weeks if no improvement.   Marjie Skiff, MD [place] PGY-2

## 2017-02-03 ENCOUNTER — Encounter (HOSPITAL_COMMUNITY): Payer: Self-pay | Admitting: Dentistry

## 2017-02-09 NOTE — Progress Notes (Signed)
   [facility] Phone: [phone]   Date of Visit: 02/10/2017   HPI:  Patient here for medication refills.   HTN:  - Medications: Metoprolol 25mg  BID and Losartan 100mg  daily - she recently ran out of medications - denies HA, blurred vision, chest pain, lower extremity edema  Cough:  - reports of chronic cough which she has daily. It has been worsening over the past month - cough is mostly dry but she feels like she has to bring something up. Cough is worse at night  - currently does not have cold like symptoms such has nasal drainage or congestion - reports she does have postnasal drip at times and sometimes has itchy/watery eyes  - she has been using Albuterol three times a day which has not helped with the cough. She denies shortness of breath or wheezing. She reports that she went to see her specialist at [facility] today and was told that she has been using her inhalers incorrectly. She should be using her inhalers through her stoma but she has been using it with her mouth. They asked her to get a spacer from PCP.  - she does have heart burn and reports that symptoms are controlled with Prilosec. - her specialist switched her from mucinex to scopolamine because mucinex dried her up too much    ROS: See HPI.  [place]:  PMH: HTN COPD Hypothyroidism History of Cancer of Larynx Dysphagia?  PHYSICAL EXAM: BP (!) 172/90 (BP Location: Right Arm, Patient Position: Sitting, Cuff Size: Normal)   Pulse 92   Temp 98.4 F (36.9 C) (Oral)   Ht 5\' 7"  (1.702 m)   Wt 216 lb (98 kg)   SpO2 91%   BMI 33.83 kg/m  GEN: NAD, tracheostomy in place   CV: RRR, no murmurs, rubs, or gallops PULM: CTAB, normal effort, no wheezing or crackles noted SKIN: No rash or cyanosis; warm and well-perfused EXTR: No lower extremity edema or calf tenderness PSYCH: Mood and affect euthymic, normal rate and volume of speech NEURO: Awake, alert, no focal deficits grossly, normal  speech   ASSESSMENT/PLAN:  Health maintenance:  -discussed getting mammogram done     COPD (chronic obstructive pulmonary disease) ([place]) I believe her cough is due to her COPD. Could also be post-nasal drip. She has been using her inhalers by mouth instead of her stoma which may be contributing to this cough. Will prescribe spacer per specialist recommendation. Follow up in 4 weeks.   HTN (hypertension) Elevated today as she did not take medications. Refilled medications. Patient to either check BP at home in 1 week and call with values or make nurse visit in 1 week for BP check.   Of note, patient reports that she never received rx for Lyrica at last visit. I reprinted Rx.   [name], MD PGY [place]

## 2017-02-10 ENCOUNTER — Encounter: Payer: Self-pay | Admitting: Internal Medicine

## 2017-02-10 ENCOUNTER — Ambulatory Visit (INDEPENDENT_AMBULATORY_CARE_PROVIDER_SITE_OTHER): Payer: Medicaid Other | Admitting: Internal Medicine

## 2017-02-10 DIAGNOSIS — M5441 Lumbago with sciatica, right side: Secondary | ICD-10-CM

## 2017-02-10 DIAGNOSIS — I10 Essential (primary) hypertension: Secondary | ICD-10-CM | POA: Diagnosis not present

## 2017-02-10 DIAGNOSIS — J449 Chronic obstructive pulmonary disease, unspecified: Secondary | ICD-10-CM | POA: Diagnosis not present

## 2017-02-10 MED ORDER — PREGABALIN 75 MG PO CAPS: 75.0000 mg | ORAL_CAPSULE | Freq: Two times a day (BID) | ORAL | 0 refills | Status: DC

## 2017-02-10 MED ORDER — METOPROLOL TARTRATE 25 MG PO TABS: 25.0000 mg | ORAL_TABLET | Freq: Two times a day (BID) | ORAL | 6 refills | Status: DC

## 2017-02-10 MED ORDER — LOSARTAN POTASSIUM 100 MG PO TABS: 100.0000 mg | ORAL_TABLET | Freq: Every day | ORAL | 5 refills | Status: DC

## 2017-02-10 MED ORDER — TIOTROPIUM BROMIDE MONOHYDRATE 18 MCG IN CAPS: ORAL_CAPSULE | RESPIRATORY_TRACT | 3 refills | Status: DC

## 2017-02-10 MED ORDER — SPACER/AERO CHAMBER MOUTHPIECE MISC: 0 refills | Status: DC

## 2017-02-10 NOTE — Patient Instructions (Signed)
I sent a prescription for your spacer.  Follow up in 4 weeks Please make a nurse visit in 1 week for your blood pressure check.

## 2017-02-10 NOTE — Assessment & Plan Note (Signed)
Elevated today as she did not take medications. Refilled medications. Patient to either check BP at home in 1 week and call with values or make nurse visit in 1 week for BP check.

## 2017-02-10 NOTE — Assessment & Plan Note (Signed)
I believe her cough is due to her COPD. Could also be post-nasal drip. She has been using her inhalers by mouth instead of her stoma which may be contributing to this cough. Will prescribe spacer per specialist recommendation. Follow up in 4 weeks.

## 2017-02-11 ENCOUNTER — Other Ambulatory Visit: Payer: Self-pay | Admitting: Family Medicine

## 2017-02-11 ENCOUNTER — Other Ambulatory Visit: Payer: Self-pay | Admitting: Hematology and Oncology

## 2017-02-11 DIAGNOSIS — M5441 Lumbago with sciatica, right side: Secondary | ICD-10-CM

## 2017-02-19 ENCOUNTER — Ambulatory Visit (INDEPENDENT_AMBULATORY_CARE_PROVIDER_SITE_OTHER): Payer: Medicaid Other | Admitting: *Deleted

## 2017-02-19 ENCOUNTER — Telehealth: Payer: Self-pay | Admitting: Internal Medicine

## 2017-02-19 VITALS — BP 132/82

## 2017-02-19 DIAGNOSIS — I10 Essential (primary) hypertension: Secondary | ICD-10-CM

## 2017-02-19 NOTE — Telephone Encounter (Signed)
Patient came to office stated the RX Lyrica 75mg  was $500.00 and she could not afford this. Patient request something else be called in. Please let patient know. [phone].

## 2017-02-19 NOTE — Progress Notes (Signed)
Patient here today for BP check.  Denies any headache or dizziness.  BP - 132/82 (L) .  Also, states Lyrica not covered by insurance and will need alternative med.  Will route request to PCP.  Burna Forts, BSN, RN-BC

## 2017-02-20 NOTE — Telephone Encounter (Signed)
LM for patient asking her to give [place] a few days to send PA to insurance to see if this helps with medication cost.  Will place form in provider's box to be completed. Jazmin Hartsell,CMA

## 2017-02-20 NOTE — Telephone Encounter (Signed)
Please see if I can complete a prior authorization for Lyrica for this patient because she did not tolerate Gabapentin . (i am sorry, I am on inpatient)

## 2017-02-23 ENCOUNTER — Other Ambulatory Visit: Payer: Self-pay | Admitting: Family Medicine

## 2017-02-23 DIAGNOSIS — Z1231 Encounter for screening mammogram for malignant neoplasm of breast: Secondary | ICD-10-CM

## 2017-02-23 NOTE — Telephone Encounter (Signed)
Completed PA form and placed in Ms. Martin's office

## 2017-02-24 NOTE — Telephone Encounter (Signed)
PA for Lyrica 75 mg capsule was approved via [place] [employer] until 02/24/18. Approval number: [id number].    Left voice message informing patient that PA was approved.  Derl Barrow, RN

## 2017-03-03 ENCOUNTER — Ambulatory Visit
Admission: RE | Admit: 2017-03-03 | Discharge: 2017-03-03 | Disposition: A | Discharge status: Home / Self Care | Payer: Medicaid Other | Source: Ambulatory Visit | Attending: Family Medicine | Admitting: Family Medicine

## 2017-03-03 DIAGNOSIS — Z1231 Encounter for screening mammogram for malignant neoplasm of breast: Secondary | ICD-10-CM | POA: Diagnosis not present

## 2017-03-27 ENCOUNTER — Ambulatory Visit (INDEPENDENT_AMBULATORY_CARE_PROVIDER_SITE_OTHER): Payer: Medicaid Other | Admitting: *Deleted

## 2017-03-27 DIAGNOSIS — Z23 Encounter for immunization: Secondary | ICD-10-CM

## 2017-04-15 ENCOUNTER — Other Ambulatory Visit: Payer: Self-pay | Admitting: Internal Medicine

## 2017-05-11 ENCOUNTER — Other Ambulatory Visit: Payer: Self-pay | Admitting: Obstetrics and Gynecology

## 2017-05-11 MED ORDER — LORATADINE 10 MG PO TABS: 10.0000 mg | ORAL_TABLET | Freq: Every day | ORAL | 8 refills | Status: DC

## 2017-05-13 ENCOUNTER — Other Ambulatory Visit: Payer: Self-pay | Admitting: Internal Medicine

## 2017-07-13 ENCOUNTER — Other Ambulatory Visit: Payer: Self-pay | Admitting: Obstetrics and Gynecology

## 2017-07-13 DIAGNOSIS — E039 Hypothyroidism, unspecified: Secondary | ICD-10-CM

## 2017-07-13 MED ORDER — MOMETASONE FURO-FORMOTEROL FUM 200-5 MCG/ACT IN AERO: 2.0000 | INHALATION_SPRAY | Freq: Two times a day (BID) | RESPIRATORY_TRACT | 2 refills | Status: DC

## 2017-07-13 NOTE — Telephone Encounter (Signed)
Advised patient she needs appointment. She made one today.

## 2017-07-13 NOTE — Telephone Encounter (Signed)
Please have patient make an appointment for follow up

## 2017-07-20 ENCOUNTER — Other Ambulatory Visit: Payer: Self-pay | Admitting: *Deleted

## 2017-07-20 MED ORDER — IBUPROFEN 600 MG PO TABS: ORAL_TABLET | ORAL | 0 refills | Status: DC

## 2017-07-26 NOTE — Progress Notes (Signed)
[facility] Phone: [phone]   Date of Visit: 07/27/2017   HPI:  Patient initially made appointment for physical but patient has a few concerns.   Depression:  - reports that lost her job a few months ago which has been significantly affecting her mood - she also has financial difficulties  - she has trouble sleeping - she has decreased energy  - she denies SI/HI - she would like to start a medication. She was on Wellbutrin mainly for smoking cessation but reports of improved mood while on this. She would like to restart Wellbutrin   Knee Pain: - reports of chronic bilateral knee pain which worsened about 1 months ago  - no trauma or over-work - reports sometimes her R knee swells - right knee is worse than the left - reports her knees sometimes lock up  - she has tried knee injections in the past which have helped - currently she uses Ibuprofen 600mg  TID PRN which do help. She denies any stomach upset with this. - reports that she has stiffness during times of inactivity and sometimes her knees lock up  - no falls  -  Xrays of knees 12/2014: moderate degenerative change involving the medial joint compartments of both knees and moderate degenerative change of the patellofemoral compartment on the left  Weight Gain:  - reports she has gained weight recently. Per chart review, patient has gained about 20lb in about 1 year  - she has not exercised in a few months. She believes this is due to her mood  - she has also been lax on her diet. Reports that she is eating the same types of food but just a lot more  Hypothyroidism:  - medication: Synthroid 165mcg daily  - compliant with medication   COPD/DOE:  - reports of dyspnea on exertion for the past month or so - albuterol does help  - no chest pain or LE edema  - reports of 3 pillow orthopnea for the past month or so  - compliant with Breo and Spiriva - reports she uses albuterol three times a day     ROS: See HPI.  [place]:  PMH: HTN COPD Hypothyroidism  History of head and neck cancer  PHYSICAL EXAM: BP 128/84   Pulse 65   Temp 98.7 F (37.1 C) (Oral)   Ht 5\' 7"  (1.702 m)   Wt 229 lb (103.9 kg)   SpO2 96%   BMI 35.87 kg/m  GEN: NAD Neck: tracheostomy  CV: RRR, no murmurs, rubs, or gallops PULM: CTAB, normal effort ABD: Soft, nontender, nondistended, NABS, no organomegaly SKIN: No rash or cyanosis; warm and well-perfused EXTR: No lower extremity edema or calf tenderness  ASSESSMENT/PLAN:  Depressed state PHQ9 10. Patient has new life stressors. She would like to start medication, Wellbutrin. Will restart Wellbutrin 150mg  daily. Declined behavioral health. No SI/HI. Follow up in 4 weeks.   Osteoarthritis of knees, bilateral No Effusion noted. Medial joint line tenderness. Switch from Ibuprofen to Mobic 7.5mg  daily PRN. Discussed side effects. We did not have time for knee injections today. Will do at next visit. Discussed knee exercises.   Weight gain Likely contributed by lack of adherence to healthy diet and exercise which we discussed. Will also check TSH with history of hypothyroidism. Does not appear clinically volume overloaded.   COPD (chronic obstructive pulmonary disease) (HCC) Currently on breo and spiriva. Not consistent with COPD exacerbation but I do not think her COPD is controlled. Will  refer to Pulmonology.   Dyspnea on Exertion:  Normal O2 saturation. Seems most consistent with likely her COPD that is not as controlled as it should be.  Other considerations include heart failure however there is no sign of volume overload clinically.  We will consider ordering echo in the near future to see has pulmonary artery hypertension which may be contributing to this.  Obesity: history of elevated blood sugar on BMP. Will screen for DM with hemoglobin a1c.   [name], MD PGY [place]

## 2017-07-27 ENCOUNTER — Other Ambulatory Visit: Payer: Self-pay

## 2017-07-27 ENCOUNTER — Encounter: Payer: Self-pay | Admitting: Internal Medicine

## 2017-07-27 ENCOUNTER — Ambulatory Visit (INDEPENDENT_AMBULATORY_CARE_PROVIDER_SITE_OTHER): Payer: Medicaid Other | Admitting: Internal Medicine

## 2017-07-27 VITALS — BP 128/84 | HR 65 | Temp 98.7°F | Ht 67.0 in | Wt 229.0 lb

## 2017-07-27 DIAGNOSIS — M170 Bilateral primary osteoarthritis of knee: Secondary | ICD-10-CM | POA: Diagnosis not present

## 2017-07-27 DIAGNOSIS — F329 Major depressive disorder, single episode, unspecified: Secondary | ICD-10-CM | POA: Diagnosis not present

## 2017-07-27 DIAGNOSIS — E039 Hypothyroidism, unspecified: Secondary | ICD-10-CM

## 2017-07-27 DIAGNOSIS — J449 Chronic obstructive pulmonary disease, unspecified: Secondary | ICD-10-CM

## 2017-07-27 DIAGNOSIS — R635 Abnormal weight gain: Secondary | ICD-10-CM | POA: Diagnosis not present

## 2017-07-27 DIAGNOSIS — F32A Depression, unspecified: Secondary | ICD-10-CM | POA: Insufficient documentation

## 2017-07-27 DIAGNOSIS — R739 Hyperglycemia, unspecified: Secondary | ICD-10-CM

## 2017-07-27 MED ORDER — MELOXICAM 7.5 MG PO TABS: 7.5000 mg | ORAL_TABLET | Freq: Every day | ORAL | 0 refills | Status: DC

## 2017-07-27 MED ORDER — BUPROPION HCL ER (XL) 150 MG PO TB24: 150.0000 mg | ORAL_TABLET | Freq: Every day | ORAL | 0 refills | Status: DC

## 2017-07-27 MED ORDER — BENZONATATE 100 MG PO CAPS: ORAL_CAPSULE | ORAL | 0 refills | Status: DC

## 2017-07-27 NOTE — Assessment & Plan Note (Signed)
PHQ9 10. Patient has new life stressors. She would like to start medication, Wellbutrin. Will restart Wellbutrin 150mg  daily. Declined behavioral health. No SI/HI. Follow up in 4 weeks.

## 2017-07-27 NOTE — Assessment & Plan Note (Signed)
Likely contributed by lack of adherence to healthy diet and exercise which we discussed. Will also check TSH with history of hypothyroidism. Does not appear clinically volume overloaded.

## 2017-07-27 NOTE — Assessment & Plan Note (Signed)
Currently on breo and spiriva. Not consistent with COPD exacerbation but I do not think her COPD is controlled. Will refer to Pulmonology.

## 2017-07-27 NOTE — Patient Instructions (Addendum)
1) for your depression, start Wellbutrin 1 tab daily. Follow up in 4 weeks  2) We will start Mobic once daily as needed for knee pain.   3) We will check some labs today   4) I made a referral to the lung doctor.

## 2017-07-27 NOTE — Assessment & Plan Note (Signed)
No Effusion noted. Medial joint line tenderness. Switch from Ibuprofen to Mobic 7.5mg  daily PRN. Discussed side effects. We did not have time for knee injections today. Will do at next visit. Discussed knee exercises.

## 2017-07-28 ENCOUNTER — Telehealth: Payer: Self-pay | Admitting: Internal Medicine

## 2017-07-28 ENCOUNTER — Encounter: Payer: Self-pay | Admitting: Internal Medicine

## 2017-07-28 DIAGNOSIS — E039 Hypothyroidism, unspecified: Secondary | ICD-10-CM

## 2017-07-28 LAB — TSH: TSH: 35 u[IU]/mL — ABNORMAL HIGH (ref 0.450–4.500)

## 2017-07-28 MED ORDER — LEVOTHYROXINE SODIUM 200 MCG PO TABS: 200.0000 ug | ORAL_TABLET | Freq: Every day | ORAL | 3 refills | Status: DC

## 2017-07-28 NOTE — Assessment & Plan Note (Signed)
TSH elevated. Patient taking medication appropriately. Will increase Synthroid to 216mcg from 149mcg. Discussed with patient.

## 2017-07-28 NOTE — Telephone Encounter (Signed)
Attempted to call patient to discuss TSH levels, but went to voicemail. Asked to call back.

## 2017-07-28 NOTE — Telephone Encounter (Signed)
Patient left message on nurse line that she is returning PCP's call. Danley Danker, RN [facility] RN)

## 2017-07-28 NOTE — Addendum Note (Signed)
Addended by: [name] on: 07/28/2017 01:31 PM   Modules accepted: Orders

## 2017-08-13 ENCOUNTER — Emergency Department (HOSPITAL_COMMUNITY): Payer: Medicaid Other

## 2017-08-13 ENCOUNTER — Other Ambulatory Visit: Payer: Self-pay

## 2017-08-13 ENCOUNTER — Encounter (HOSPITAL_COMMUNITY): Payer: Self-pay

## 2017-08-13 ENCOUNTER — Emergency Department (HOSPITAL_COMMUNITY)
Admission: EM | Admit: 2017-08-13 | Discharge: 2017-08-13 | Disposition: A | Discharge status: Home / Self Care | Payer: Medicaid Other | Attending: Emergency Medicine | Admitting: Emergency Medicine

## 2017-08-13 DIAGNOSIS — R0603 Acute respiratory distress: Secondary | ICD-10-CM

## 2017-08-13 DIAGNOSIS — J9503 Malfunction of tracheostomy stoma: Secondary | ICD-10-CM | POA: Diagnosis not present

## 2017-08-13 DIAGNOSIS — J988 Other specified respiratory disorders: Secondary | ICD-10-CM

## 2017-08-13 DIAGNOSIS — E039 Hypothyroidism, unspecified: Secondary | ICD-10-CM | POA: Diagnosis not present

## 2017-08-13 DIAGNOSIS — Z87891 Personal history of nicotine dependence: Secondary | ICD-10-CM | POA: Insufficient documentation

## 2017-08-13 DIAGNOSIS — I10 Essential (primary) hypertension: Secondary | ICD-10-CM | POA: Diagnosis not present

## 2017-08-13 DIAGNOSIS — J45909 Unspecified asthma, uncomplicated: Secondary | ICD-10-CM | POA: Diagnosis not present

## 2017-08-13 DIAGNOSIS — J449 Chronic obstructive pulmonary disease, unspecified: Secondary | ICD-10-CM | POA: Insufficient documentation

## 2017-08-13 DIAGNOSIS — J9509 Other tracheostomy complication: Secondary | ICD-10-CM | POA: Diagnosis present

## 2017-08-13 DIAGNOSIS — Z79899 Other long term (current) drug therapy: Secondary | ICD-10-CM | POA: Insufficient documentation

## 2017-08-13 DIAGNOSIS — Z8521 Personal history of malignant neoplasm of larynx: Secondary | ICD-10-CM | POA: Diagnosis not present

## 2017-08-13 DIAGNOSIS — R918 Other nonspecific abnormal finding of lung field: Secondary | ICD-10-CM | POA: Diagnosis not present

## 2017-08-13 NOTE — Consult Note (Signed)
Reason for Consult:airway obstruction Referring Physician: er  Deborah [name] is an 62 y.o. female.  HPI: hx of total laryngectomy years ago and had TEP which is managed by [facility]. SHe had the TEP manipulated on MOnday and it now had dislodged. SHe is having difficulty breathing and  coughing constantly. She had xray which shows the TEP in the airway.   Past Medical History:  Diagnosis Date  . Anemia   . Arthritis    in knees  . Asthma   . Bronchitis   . COPD (chronic obstructive pulmonary disease) ([place])   . Diverticulosis 11/15/2012    noted on screening colonoscopy   . Dyspnea    with COPD exerbation  . Esophageal stricture   . Former smoker 03/19/2011  . GERD (gastroesophageal reflux disease)   . Heart murmur    asymptomatic   . History of laryngectomy   . 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 Hypertension   . 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 Internal hemorrhoid 11/15/2012    small, noted on screening colonoscopy   . 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  . Leukocytopenia   . Nausea alone 07/28/2013  . Neck pain 01/21/2012  . Normal MRI 07/14/11   negative for mestasis   . Pneumonia 2012  . Sciatica   . Seizures ([place])    07/24/11 off Effexor w/o seizure  . Sepsis ([place]) 08/04/12  . Sinusitis, chronic 07/20/2011  . Sinusitis, chronic 07/20/2011   Bilateral maxillary, identified on MRI of head 07/14/11.    . Tracheostomy dependent [facility])     Past Surgical History:  Procedure Laterality Date  . COLONOSCOPY N/A 11/15/2012   Procedure: COLONOSCOPY;  Surgeon: Lafayette Dragon, MD;  [facility];  Service: Endoscopy;  Laterality: N/A;  . DENTAL RESTORATION/EXTRACTION WITH X-RAY    . ESOPHAGOSCOPY  06/21/2012   Procedure: ESOPHAGOSCOPY;  Surgeon: [name], MD;  Location: [place];  Service: ENT;  Laterality: N/A;  . ESOPHAGOSCOPY WITH DILITATION N/A 09/21/2014   Procedure:  ESOPHAGOSCOPY WITH DILITATION;  Surgeon: [name], MD;  Location: [place];  Service: ENT;  Laterality: N/A;  . ESOPHAGOSCOPY WITH DILITATION N/A 07/04/2016   Procedure: ESOPHAGOSCOPY WITH DILITATION;  Surgeon: [name], MD;  Location: [place];  Service: ENT;  Laterality: N/A;  . FOREIGN BODY REMOVAL BRONCHIAL  10/02/2011   Procedure: REMOVAL FOREIGN BODY BRONCHIAL;  Surgeon: Ruby Cola, MD;  Location: [place];  Service: ENT;  Laterality: N/A;  . LARYNGECTOMY    . Porta cath removal    . PORTACATH PLACEMENT  09/17/10   Tip in cavoatrial junction  . STOMAPLASTY N/A 10/21/2016   Procedure: Zola Button;  Surgeon: [name], MD;  Location: [place];  Service: ENT;  Laterality: N/A;  . TRACHEAL DILITATION  07/16/2011   Procedure: TRACHEAL DILITATION;  Surgeon: Beckie Salts, MD;  Location: [place];  Service: ENT;  Laterality: N/A;  dilation of tracheal stoma and replacement of stoma tube  . TUBAL LIGATION  1982    Family History  Problem Relation Age of Onset  . Heart disease Mother   . Heart disease Father   . Heart disease Sister   . Breast cancer Neg Hx     Social History:  reports that she quit smoking about 6 years ago. Her smoking use included cigarettes. She has a 40.00 pack-year smoking history. she has never used smokeless  tobacco. She reports that she drinks alcohol. She reports that she does not use drugs.  Allergies:  Allergies  Allergen Reactions  . Lisinopril Cough    Medications: I have reviewed the patient's current medications.  No results found for this or any previous visit (from the past 48 hour(s)).  Dg Neck Soft Tissue  Result Date: 08/13/2017 CLINICAL DATA:  62 year old female inhaled tracheostomy ring. EXAM: NECK SOFT TISSUES - 1+ VIEW COMPARISON:  None. FINDINGS: A single LATERAL view of the neck soft tissues evaluate structures only down to the C5-6 level. No identifiable radiopaque foreign body noted. No significant bony abnormalities are identified. IMPRESSION:  No acute abnormality or radiopaque foreign body identified. Please note that the neck is only evaluated down to the C5-6 level. Electronically Signed   By: Margarette Canada M.D.   On: 08/13/2017 08:56   Dg Chest Port 1 View  Result Date: 08/13/2017 CLINICAL DATA:  Dislodged and inhaled tracheostomy ring EXAM: PORTABLE CHEST 1 VIEW COMPARISON:  11/10/2015 chest radiograph. FINDINGS: Stable cardiomediastinal silhouette with normal heart size. No pneumothorax. No pleural effusion. No pulmonary edema. Mild hypoventilatory changes at the lung bases. EKG leads overlie the bilateral chest. Tracheostomy mask overlies the left upper chest. No unexpected radiopaque foreign body. IMPRESSION: Mild hypoventilatory changes at the lung bases. No unexpected radiopaque foreign body. Electronically Signed   By: Ilona Sorrel M.D.   On: 08/13/2017 08:54    Review of Systems  Constitutional: Negative.   Eyes: Negative.   Respiratory: Positive for stridor.   Skin: Negative.    Blood pressure 137/79, pulse 91, temperature 98.7 F (37.1 C), temperature source Oral, resp. rate 18, SpO2 96 %. Physical Exam  Constitutional: She appears well-developed and well-nourished.  HENT:  Nose: Nose normal.  Mouth/Throat: Oropharynx is clear and moist.  She has a stoma per laryngectomy. The fibroptic scope placed into the airway and the TEP was clearly identified at the carina. She was coughing constantly through the exam. Once the scope was removed she coughed the object up out of her airway. She immediately was relieved of all coughing and airway issues. The fistula is still open but not leaking excessively. There is no infection.  Eyes: Conjunctivae are normal. Pupils are equal, round, and reactive to light.  Neck: Normal range of motion. Neck supple.    Assessment/Plan: AIrway foreign body- the TEP was coughed up after the fibroptic exam. 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  SHe was immediately breathing normal and will follow up as needed.  Melissa Montane 08/13/2017, 11:00 AM

## 2017-08-13 NOTE — ED Provider Notes (Signed)
[facility] EMERGENCY DEPARTMENT Provider Note   CSN: [id number] Arrival date & time: 08/13/17  0808     History   Chief Complaint Chief Complaint  Patient presents with  . Trach disloged in throat    HPI Deborah [name] is a 62 y.o. female.  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  Patient states that as part of her tracheostomy she has an inner piece, that she thinks that she has aspirated.  Patient's symptoms started about 2 hours ago and were sudden onset.  Patient is having constant coughing and airway obstructive sensation since then.  Per EMS, patient was hypoxic when they arrived and she is on supplemental oxygen through non-rebreather through her trach stoma.  Past Medical History:  Diagnosis Date  . Anemia   . Arthritis    in knees  . Asthma   . Bronchitis   . COPD (chronic obstructive pulmonary disease) ([place])   . Diverticulosis 11/15/2012    noted on screening colonoscopy   . Dyspnea    with COPD exerbation  . Esophageal stricture   . Former smoker 03/19/2011  . GERD (gastroesophageal reflux disease)   . Heart murmur    asymptomatic   . History of laryngectomy   . 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 Hypertension   . 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 Internal hemorrhoid 11/15/2012    small, noted on screening colonoscopy   . Larynx cancer ([place]) 07/31/2010   supraglotttic s/p chemo/radiation and surgical rescection.  . Leukocytopenia   . Nausea alone 07/28/2013  . Neck pain 01/21/2012  . Normal MRI 07/14/11   negative for mestasis   . Pneumonia 2012  . Sciatica   . Seizures ([place])    07/24/11 off Effexor w/o seizure  . Sepsis ([place]) 08/04/12  . Sinusitis, chronic 07/20/2011  . Sinusitis, chronic 07/20/2011   Bilateral maxillary, identified on MRI of head 07/14/11.    .  Tracheostomy dependent [facility])     Patient Active Problem List   Diagnosis Date Noted  . Depressed state 07/27/2017  . Plantar fasciitis 06/06/2016  . Low back pain 05/21/2016  . Health care maintenance 12/29/2015  . Osteoarthritis of knees, bilateral 05/31/2015  . Dysphagia 08/16/2014  . Weight gain 08/05/2014  . COPD (chronic obstructive pulmonary disease) ([place]) 04/29/2014  . Anxiety state, unspecified 01/18/2014  . Frequent falls 12/23/2013  . Bilateral shoulder pain 04/07/2013  . Anemia in chronic illness 08/11/2012  . Status post trachelectomy 08/04/2012  . Chronic pain 01/20/2012  . History of head and neck cancer 09/26/2011  . Hx of radiation therapy   . Seizure ([place]) 07/17/2011  . Acquired hypothyroidism 03/19/2011  . HTN (hypertension) 03/19/2011    Past Surgical History:  Procedure Laterality Date  . COLONOSCOPY N/A 11/15/2012   Procedure: COLONOSCOPY;  Surgeon: Lafayette Dragon, MD;  [facility];  Service: Endoscopy;  Laterality: N/A;  . DENTAL RESTORATION/EXTRACTION WITH X-RAY    . ESOPHAGOSCOPY  06/21/2012   Procedure: ESOPHAGOSCOPY;  Surgeon: [name], MD;  Location: [place];  Service: ENT;  Laterality: N/A;  . ESOPHAGOSCOPY WITH DILITATION N/A 09/21/2014   Procedure: ESOPHAGOSCOPY WITH DILITATION;  Surgeon: [name], MD;  Location: [place];  Service: ENT;  Laterality: N/A;  . ESOPHAGOSCOPY WITH DILITATION N/A 07/04/2016   Procedure:  ESOPHAGOSCOPY WITH DILITATION;  Surgeon: [name], MD;  Location: [place];  Service: ENT;  Laterality: N/A;  . FOREIGN BODY REMOVAL BRONCHIAL  10/02/2011   Procedure: REMOVAL FOREIGN BODY BRONCHIAL;  Surgeon: Ruby Cola, MD;  Location: [place];  Service: ENT;  Laterality: N/A;  . LARYNGECTOMY    . Porta cath removal    . PORTACATH PLACEMENT  09/17/10   Tip in cavoatrial junction  . STOMAPLASTY N/A 10/21/2016   Procedure: Zola Button;  Surgeon: [name], MD;  Location: [place];  Service: ENT;  Laterality: N/A;   . TRACHEAL DILITATION  07/16/2011   Procedure: TRACHEAL DILITATION;  Surgeon: Beckie Salts, MD;  Location: [place];  Service: ENT;  Laterality: N/A;  dilation of tracheal stoma and replacement of stoma tube  . TUBAL LIGATION  1982    OB History    No data available       Home Medications    Prior to Admission medications   Medication Sig Start Date End Date Taking? Authorizing Provider  benzonatate (TESSALON) 100 MG capsule TAKE ONE CAPSULE BY MOUTH THREE TIMES DAILY AS NEEDED FOR COUGH 07/27/17   [name], MD  buPROPion (WELLBUTRIN XL) 150 MG 24 hr tablet Take 1 tablet (150 mg total) by mouth daily. 07/27/17   [name], MD  levothyroxine (SYNTHROID, LEVOTHROID) 200 MCG tablet Take 1 tablet (200 mcg total) by mouth daily before breakfast. 07/28/17   [name], MD  loratadine (EQ ALLERGY RELIEF) 10 MG tablet Take 1 tablet (10 mg total) by mouth daily. 05/11/17   [name], MD  losartan (COZAAR) 100 MG tablet Take 1 tablet (100 mg total) by mouth daily. 02/10/17   [name], MD  MELATONIN PO Take 3 mg by mouth at bedtime as needed (sleep).     [provider]  meloxicam (MOBIC) 7.5 MG tablet Take 1 tablet (7.5 mg total) by mouth daily. 07/27/17   [name], MD  metoprolol tartrate (LOPRESSOR) 25 MG tablet Take 1 tablet (25 mg total) by mouth 2 (two) times daily. 02/10/17   [name], MD  mometasone-formoterol (DULERA) 200-5 MCG/ACT AERO Inhale 2 puffs into the lungs 2 (two) times daily. 07/13/17   [name], MD  nystatin cream (MYCOSTATIN) APPLY 1 APPLICATION TOPICALLY 2 TIMES DAILY UNTIL RASH RESOLVES 01/14/17   [name], MD  omeprazole (PRILOSEC) 40 MG capsule TAKE ONE CAPSULE BY MOUTH IN THE MORNING. TAKE AT LEAST 4 HOURS AFTER TAKING LEVOTHYROXINE 06/11/16   Katheren Shams, DO  PROVENTIL HFA 108 [phone] Base) MCG/ACT inhaler INHALE TWO PUFFS BY MOUTH EVERY 4 HOURS AS NEEDED FOR WHEEZING FOR  SHORTNESS OF BREATH 07/13/17   [name], MD  Spacer/Aero Chamber Mouthpiece MISC Use spacer with all inhalers 02/10/17   [name], MD  tiotropium (SPIRIVA HANDIHALER) 18 MCG inhalation capsule PLACE ONE CAPSULE INTO INHALER AND INHALE DAILY. 02/10/17   [name], MD  TRANSDERM-SCOP, 1.5 MG, 1 MG/3DAYS PLACE 1 PATCH ONTO THE SKIN EVERY 3 DAYS 04/15/17   [name], MD    Family History Family History  Problem Relation Age of Onset  . Heart disease Mother   . Heart disease Father   . Heart disease Sister   . Breast cancer Neg Hx     Social History Social History   Tobacco Use  . Smoking status: Former Smoker    Packs/day: 2.00    Years: 20.00  Pack years: 40.00    Types: Cigarettes    Last attempt to quit: 09/17/2010    Years since quitting: 6.9  . Smokeless tobacco: Never Used  Substance Use Topics  . Alcohol use: Yes    Comment: Socially drinks   . Drug use: No    Comment: tried cocaine 1 time 2010 only used 1 time     Allergies   Lisinopril   Review of Systems Review of Systems  Unable to perform ROS: Severe respiratory distress  Constitutional: Positive for activity change.  Respiratory: Positive for cough, choking and shortness of breath.      Physical Exam Updated Vital Signs BP 137/79   Pulse 91   Temp 98.7 F (37.1 C) (Oral)   Resp 18   SpO2 96%   Physical Exam  Constitutional: She is oriented to person, place, and time. She appears well-developed. She appears distressed.  Distressed due to respiratory foreign body  HENT:  Head: Normocephalic and atraumatic.  Mouth/Throat: Oropharynx is clear and moist.  Eyes: EOM are normal.  Neck: Normal range of motion. Neck supple.  Tracheostomy stoma is patent, there is no visible foreign body  Cardiovascular: Normal rate.  Pulmonary/Chest: No stridor. She is in respiratory distress. She has no wheezes.  Abdominal: Bowel sounds are normal.  Neurological: She is  alert and oriented to person, place, and time.  Skin: Skin is warm and dry.  Nursing note and vitals reviewed.    ED Treatments / Results  Labs (all labs ordered are listed, but only abnormal results are displayed) Labs Reviewed - No data to display  EKG  EKG Interpretation None       Radiology Dg Neck Soft Tissue  Result Date: 08/13/2017 CLINICAL DATA:  62 year old female inhaled tracheostomy ring. EXAM: NECK SOFT TISSUES - 1+ VIEW COMPARISON:  None. FINDINGS: A single LATERAL view of the neck soft tissues evaluate structures only down to the C5-6 level. No identifiable radiopaque foreign body noted. No significant bony abnormalities are identified. IMPRESSION: No acute abnormality or radiopaque foreign body identified. Please note that the neck is only evaluated down to the C5-6 level. Electronically Signed   By: Margarette Canada M.D.   On: 08/13/2017 08:56   Dg Chest Port 1 View  Result Date: 08/13/2017 CLINICAL DATA:  Dislodged and inhaled tracheostomy ring EXAM: PORTABLE CHEST 1 VIEW COMPARISON:  11/10/2015 chest radiograph. FINDINGS: Stable cardiomediastinal silhouette with normal heart size. No pneumothorax. No pleural effusion. No pulmonary edema. Mild hypoventilatory changes at the lung bases. EKG leads overlie the bilateral chest. Tracheostomy mask overlies the left upper chest. No unexpected radiopaque foreign body. IMPRESSION: Mild hypoventilatory changes at the lung bases. No unexpected radiopaque foreign body. Electronically Signed   By: Ilona Sorrel M.D.   On: 08/13/2017 08:54    Procedures Procedures (including critical care time)  CRITICAL CARE Performed by: Riyan Haile   Total critical care time: 38 minutes  Critical care time was exclusive of separately billable procedures and treating other patients.  Critical care was necessary to treat or prevent imminent or life-threatening deterioration.  Critical care was time spent personally by me on the following  activities: development of treatment plan with patient and/or surrogate as well as nursing, discussions with consultants, evaluation of patient's response to treatment, examination of patient, obtaining history from patient or surrogate, ordering and performing treatments and interventions, ordering and review of laboratory studies, ordering and review of radiographic studies, pulse oximetry and re-evaluation of patient's condition.  Medications Ordered in ED Medications - No data to display   Initial Impression / Assessment and Plan / ED Course  I have reviewed the triage vital signs and the nursing notes.  Pertinent labs & imaging results that were available during my care of the patient were reviewed by me and considered in my medical decision making (see chart for details).  Clinical Course as of Aug 13 921  Thu Aug 13, 2017  0900 With ENT at bedside ready to scope, patient had another bout of coughing, and she spontaneously was able to inject the foreign body.  ENT continued to scope the patient and did not see any damage to the visible airway due to the foreign body.   [AN]  0917 Patient appears comfortable.  O2 sats are 97% on room air. Patient has an appointment set up with her [facility] team at 1 PM. Stable for discharge.  ENT has cleared. Strict ER return precautions discussed, and patient in agreement with the plan.  [AN]    Clinical Course User Index [AN] Varney Biles, MD    62 year old female comes in with chief complaint of respiratory distress.  Patient has foreign body obstruction of her tracheostomy. Stoma appears patent, with no foreign body visualization.  Patient is in respiratory distress.  Patient's anatomy quickly reviewed, and it appears that she is not a candidate for oral intubation.  We have called ENT for stat evaluation.  Dr. [name] to come and see the patient.   Final Clinical Impressions(s) / ED Diagnoses   Final diagnoses:  Airway obstruction  Acute  respiratory distress    ED Discharge Orders    None       Varney Biles, MD 08/13/17 [phone]

## 2017-08-13 NOTE — Discharge Instructions (Signed)
Please go to [facility] as planned for the evaluation of your tracheostomy. Return to the ER immediately if there is any worsening of your breathing.

## 2017-08-13 NOTE — ED Triage Notes (Signed)
Per [facility]: Pt inhaled part of her inner cannula of her trach. Pt initally had trach placed 07/28/10 due to cancer. Pt had the trach replaced on Monday at [facility]. Pt has a strong cough with lots of mucous production. Pt states that in the past she is either able to cough the piece up or "they have to go and get it". Pt is alert and oriented. She is able to follow commands.

## 2017-08-13 NOTE — ED Notes (Signed)
X-ray at bedside

## 2017-08-13 NOTE — Progress Notes (Signed)
RT note: Assisted ENT with bedside bronch to remove inhaled tracheal button. Patient vital sign were stable through out.RT had placed patient on NRB over stoma. Patient coughed button out of stoma.

## 2017-08-13 NOTE — ED Notes (Signed)
Dr. Nanavati at bedside 

## 2017-08-13 NOTE — ED Notes (Signed)
Pt coughed up piece of trach that had been dislodged in throat. ENT at bedside when this happened.

## 2017-08-13 NOTE — ED Notes (Signed)
ENT at bedside

## 2017-08-14 ENCOUNTER — Other Ambulatory Visit: Payer: Self-pay | Admitting: *Deleted

## 2017-08-17 ENCOUNTER — Telehealth: Payer: Self-pay | Admitting: Internal Medicine

## 2017-08-17 MED ORDER — MELOXICAM 7.5 MG PO TABS: 7.5000 mg | ORAL_TABLET | Freq: Every day | ORAL | 0 refills | Status: DC

## 2017-08-17 NOTE — Telephone Encounter (Signed)
Form given back to [facility] as patient needed to sign the highlighted place on the release of information from social security allowing [place] permission to fill out the form with her information.  Marlan Steward,CMA

## 2017-08-17 NOTE — Telephone Encounter (Signed)
Disability form dropped off for at front desk for completion.  Verified that patient section of form has been completed.  Last DOS/WCC with PCP was 07/27/17.  Placed form in team folder to be completed by clinical staff.  Crista Luria

## 2017-08-18 NOTE — H&P (Signed)
Deborah [name] is an 62 y.o. female.   Chief Complaint: Trouble swallowing HPI: History of total laryngectomy and radiation many years ago.  Having some trouble swallowing again.  Has had successful esophageal dilatation in the past.  Past Medical History:  Diagnosis Date  . Anemia   . Arthritis    in knees  . Asthma   . Bronchitis   . COPD (chronic obstructive pulmonary disease) ([place])   . Diverticulosis 11/15/2012    noted on screening colonoscopy   . Dyspnea    with COPD exerbation  . Esophageal stricture   . Former smoker 03/19/2011  . GERD (gastroesophageal reflux disease)   . Heart murmur    asymptomatic   . History of laryngectomy   . 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 Hypertension   . 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 Internal hemorrhoid 11/15/2012    small, noted on screening colonoscopy   . Larynx cancer ([place]) 07/31/2010   supraglotttic s/p chemo/radiation and surgical rescection.  . Leukocytopenia   . Nausea alone 07/28/2013  . Neck pain 01/21/2012  . Normal MRI 07/14/11   negative for mestasis   . Pneumonia 2012  . Sciatica   . Seizures ([place])    07/24/11 off Effexor w/o seizure  . Sepsis ([place]) 08/04/12  . Sinusitis, chronic 07/20/2011  . Sinusitis, chronic 07/20/2011   Bilateral maxillary, identified on MRI of head 07/14/11.    . Tracheostomy dependent [facility])     Past Surgical History:  Procedure Laterality Date  . COLONOSCOPY N/A 11/15/2012   Procedure: COLONOSCOPY;  Surgeon: Lafayette Dragon, MD;  [facility];  Service: Endoscopy;  Laterality: N/A;  . DENTAL RESTORATION/EXTRACTION WITH X-RAY    . ESOPHAGOSCOPY  06/21/2012   Procedure: ESOPHAGOSCOPY;  Surgeon: [name], MD;  Location: [place];  Service: ENT;  Laterality: N/A;  . ESOPHAGOSCOPY WITH DILITATION N/A 09/21/2014   Procedure: ESOPHAGOSCOPY WITH DILITATION;  Surgeon: [name], MD;  Location: [place];  Service: ENT;  Laterality: N/A;  .  ESOPHAGOSCOPY WITH DILITATION N/A 07/04/2016   Procedure: ESOPHAGOSCOPY WITH DILITATION;  Surgeon: [name], MD;  Location: [place];  Service: ENT;  Laterality: N/A;  . FOREIGN BODY REMOVAL BRONCHIAL  10/02/2011   Procedure: REMOVAL FOREIGN BODY BRONCHIAL;  Surgeon: Ruby Cola, MD;  Location: [place];  Service: ENT;  Laterality: N/A;  . LARYNGECTOMY    . Porta cath removal    . PORTACATH PLACEMENT  09/17/10   Tip in cavoatrial junction  . STOMAPLASTY N/A 10/21/2016   Procedure: Zola Button;  Surgeon: [name], MD;  Location: [place];  Service: ENT;  Laterality: N/A;  . TRACHEAL DILITATION  07/16/2011   Procedure: TRACHEAL DILITATION;  Surgeon: Beckie Salts, MD;  Location: [place];  Service: ENT;  Laterality: N/A;  dilation of tracheal stoma and replacement of stoma tube  . TUBAL LIGATION  1982    Family History  Problem Relation Age of Onset  . Heart disease Mother   . Heart disease Father   . Heart disease Sister   . Breast cancer Neg Hx    Social History:  reports that she quit smoking about 6 years ago. Her smoking use included cigarettes. She has a 40.00 pack-year smoking history. she has never used smokeless tobacco. She reports that she drinks alcohol. She reports that she does not use drugs.  Allergies:  Allergies  Allergen Reactions  . Lisinopril Cough  No medications prior to admission.    No results found for this or any previous visit (from the past 48 hour(s)). No results found.  ROS: otherwise negative  There were no vitals taken for this visit.  PHYSICAL EXAM: Overall appearance:  Healthy appearing, in no distress Head:  Normocephalic, atraumatic. Ears: External auditory canals are clear; tympanic membranes are intact and the middle ears are free of any effusion. Nose: External nose is healthy in appearance. Internal nasal exam free of any lesions or obstruction. Oral Cavity/pharynx:  There are no mucosal lesions or masses identified. Neuro:  No identifiable  neurologic deficits. Neck: No palpable neck masses.  Stoma is clear.  TEP functioning.  Studies Reviewed: none    Assessment/Plan Esophageal stricture following laryngectomy and radiation years ago.  Proceed with esophagoscopy and dilatation.  [name] 08/18/2017, 3:28 PM

## 2017-08-18 NOTE — Telephone Encounter (Signed)
Form placed in provider's box for completion.  There was nothing for clinic staff to document. Jazmin Hartsell,CMA

## 2017-08-19 NOTE — Telephone Encounter (Signed)
The form states "please DO NOT ask your health care provider to complete this report". I will place in an envelope and place at front desk for patient's family to pick up. I will do this this evening. It will be ready for pick up tomorrow. Please inform.

## 2017-08-20 ENCOUNTER — Encounter (HOSPITAL_COMMUNITY): Payer: Self-pay | Admitting: Emergency Medicine

## 2017-08-20 ENCOUNTER — Encounter (HOSPITAL_COMMUNITY)
Admission: RE | Admit: 2017-08-20 | Discharge: 2017-08-20 | Disposition: A | Discharge status: Home / Self Care | Payer: Medicaid Other | Source: Ambulatory Visit | Attending: Otolaryngology | Admitting: Otolaryngology

## 2017-08-20 ENCOUNTER — Other Ambulatory Visit: Payer: Self-pay

## 2017-08-20 ENCOUNTER — Encounter (HOSPITAL_COMMUNITY): Payer: Self-pay

## 2017-08-20 DIAGNOSIS — R9431 Abnormal electrocardiogram [ECG] [EKG]: Secondary | ICD-10-CM | POA: Diagnosis not present

## 2017-08-20 DIAGNOSIS — K219 Gastro-esophageal reflux disease without esophagitis: Secondary | ICD-10-CM | POA: Insufficient documentation

## 2017-08-20 DIAGNOSIS — Z01812 Encounter for preprocedural laboratory examination: Secondary | ICD-10-CM | POA: Diagnosis not present

## 2017-08-20 DIAGNOSIS — Z9889 Other specified postprocedural states: Secondary | ICD-10-CM | POA: Insufficient documentation

## 2017-08-20 DIAGNOSIS — I517 Cardiomegaly: Secondary | ICD-10-CM | POA: Diagnosis not present

## 2017-08-20 DIAGNOSIS — R011 Cardiac murmur, unspecified: Secondary | ICD-10-CM | POA: Insufficient documentation

## 2017-08-20 DIAGNOSIS — R0602 Shortness of breath: Secondary | ICD-10-CM | POA: Insufficient documentation

## 2017-08-20 DIAGNOSIS — Z01810 Encounter for preprocedural cardiovascular examination: Secondary | ICD-10-CM | POA: Diagnosis present

## 2017-08-20 DIAGNOSIS — Z87891 Personal history of nicotine dependence: Secondary | ICD-10-CM | POA: Insufficient documentation

## 2017-08-20 DIAGNOSIS — R0601 Orthopnea: Secondary | ICD-10-CM | POA: Insufficient documentation

## 2017-08-20 DIAGNOSIS — J449 Chronic obstructive pulmonary disease, unspecified: Secondary | ICD-10-CM | POA: Diagnosis not present

## 2017-08-20 DIAGNOSIS — I10 Essential (primary) hypertension: Secondary | ICD-10-CM | POA: Diagnosis not present

## 2017-08-20 DIAGNOSIS — R0600 Dyspnea, unspecified: Secondary | ICD-10-CM | POA: Diagnosis not present

## 2017-08-20 LAB — CBC
HEMATOCRIT: 36.1 % (ref 36.0–46.0)
HEMOGLOBIN: 11.7 g/dL — AB (ref 12.0–15.0)
MCH: 29.3 pg (ref 26.0–34.0)
MCHC: 32.4 g/dL (ref 30.0–36.0)
MCV: 90.5 fL (ref 78.0–100.0)
Platelets: 394 10*3/uL (ref 150–400)
RBC: 3.99 MIL/uL (ref 3.87–5.11)
RDW: 13.4 % (ref 11.5–15.5)
WBC: 6.9 10*3/uL (ref 4.0–10.5)

## 2017-08-20 LAB — BASIC METABOLIC PANEL
ANION GAP: 10 (ref 5–15)
BUN: 21 mg/dL — ABNORMAL HIGH (ref 6–20)
CO2: 22 mmol/L (ref 22–32)
Calcium: 9.5 mg/dL (ref 8.9–10.3)
Chloride: 106 mmol/L (ref 101–111)
Creatinine, Ser: 0.95 mg/dL (ref 0.44–1.00)
GFR calc non Af Amer: >60 mL/min (ref >60)
GLUCOSE: 98 mg/dL (ref 65–99)
POTASSIUM: 4.2 mmol/L (ref 3.5–5.1)
Sodium: 138 mmol/L (ref 135–145)

## 2017-08-20 NOTE — Pre-Procedure Instructions (Signed)
Deborah [name] [facility]  08/20/2017      [place], [place] - [address] Deborah [name] [name] [address] Phone: [phone] Fax: [phone]    Your procedure is scheduled on August 26, 2017.  [facility] to [facility] Admitting at 09:30 A.M.  Call this number if you have problems the morning of surgery:  [phone]   Remember:  Do not eat food or drink liquids after midnight.  Take these medicines the morning of surgery with A SIP OF WATER :  Bupropion (Wellbutrin XL) Levothyroxine (Synthroid) Metoprolol (Lopressor) Omeprazole (Prilosec) Dulera Inhaler Spiriva Inhaler Proventil Inhaler if needed  Please bring inhalers with you.  7 days prior to surgery STOP taking any Aspirin (unless otherwise instructed by your surgeon), Meloxicam, Mobic, Aleve, Naproxen, Ibuprofen, Motrin, Advil, Goody's, BC's, all herbal medications, fish oil, and all vitamins.    Do not wear jewelry, make-up or nail polish.  Do not wear lotions, powders, or perfumes, or deodorant.  Do not shave 48 hours prior to surgery.   Do not bring valuables to the hospital.   [facility] is not responsible for any belongings or valuables.  Contacts, dentures or bridgework may not be worn into surgery.  Leave your suitcase in the car.  After surgery it may be brought to your room.  For patients admitted to the hospital, discharge time will be determined by your treatment team.  Patients discharged the day of surgery will not be allowed to drive home.   Special instructions:   [place]- Preparing For Surgery  Before surgery, you can play an important role. Because skin is not sterile, your skin needs to be as free of germs as possible. You can reduce the number of germs on your skin by washing with CHG (chlorahexidine gluconate) Soap before surgery.  CHG is an antiseptic cleaner which kills germs and bonds with the skin to continue killing germs even  after washing.  Please do not use if you have an allergy to CHG or antibacterial soaps. If your skin becomes reddened/irritated stop using the CHG.  Do not shave (including legs and underarms) for at least 48 hours prior to first CHG shower. It is OK to shave your face.  Please follow these instructions carefully.   1. Shower the NIGHT BEFORE SURGERY and the MORNING OF SURGERY with CHG.   2. If you chose to wash your hair, wash your hair first as usual with your normal shampoo.  3. After you shampoo, rinse your hair and body thoroughly to remove the shampoo.  4. Use CHG as you would any other liquid soap. You can apply CHG directly to the skin and wash gently with a scrungie or a clean washcloth.   5. Apply the CHG Soap to your body ONLY FROM THE NECK DOWN.  Do not use on open wounds or open sores. Avoid contact with your eyes, ears, mouth and genitals (private parts). Wash Face and genitals (private parts)  with your normal soap.  6. Wash thoroughly, paying special attention to the area where your surgery will be performed.  7. Thoroughly rinse your body with warm water from the neck down.  8. DO NOT shower/wash with your normal soap after using and rinsing off the CHG Soap.  9. Pat yourself dry with a CLEAN TOWEL.  10. Wear CLEAN PAJAMAS to bed the night before surgery, wear comfortable clothes the morning of surgery  11. Place CLEAN SHEETS on  your bed the night of your first shower and DO NOT SLEEP WITH PETS.    Day of Surgery: Do not apply any deodorants/lotions. Please wear clean clothes to the hospital/surgery center.      Please read over the following fact sheets that you were given.

## 2017-08-20 NOTE — Progress Notes (Signed)
PCP: Dr. Dallas Schimke Cardiologist: denies  EKG: today CXR: 08/13/2017 ECHO: 04/2014 Stress Test: denies Cardiac Cath: denies  Recently in hospital ED for dislodged TEP. Pt reports she successfully had it removed, went to [facility] the same day being discharged from ED to have TEP placed again. Currently has TEP in place, functionally working. Bronson Ing NP with anesthesia, did not need to see during appointment.   Patient denies shortness of breath, fever,  and chest pain at PAT appointment.  Patient verbalized understanding of instructions provided today at the PAT appointment.  Patient asked to review instructions at home and day of surgery.

## 2017-08-20 NOTE — Telephone Encounter (Signed)
Patient voiced understanding and will sign a release of information for the dates that she needs for the forms.  Jazmin Hartsell,CMA

## 2017-08-21 ENCOUNTER — Encounter: Payer: Self-pay | Admitting: Internal Medicine

## 2017-08-21 ENCOUNTER — Telehealth: Payer: Self-pay | Admitting: *Deleted

## 2017-08-21 ENCOUNTER — Ambulatory Visit (INDEPENDENT_AMBULATORY_CARE_PROVIDER_SITE_OTHER)
Admission: RE | Admit: 2017-08-21 | Discharge: 2017-08-21 | Disposition: A | Discharge status: Home / Self Care | Payer: Medicaid Other | Source: Ambulatory Visit | Attending: Internal Medicine | Admitting: Internal Medicine

## 2017-08-21 ENCOUNTER — Ambulatory Visit: Payer: Medicaid Other | Admitting: Internal Medicine

## 2017-08-21 VITALS — BP 118/70 | HR 102 | Ht 67.0 in | Wt 220.0 lb

## 2017-08-21 DIAGNOSIS — R0609 Other forms of dyspnea: Secondary | ICD-10-CM

## 2017-08-21 DIAGNOSIS — I10 Essential (primary) hypertension: Secondary | ICD-10-CM | POA: Diagnosis not present

## 2017-08-21 DIAGNOSIS — R0600 Dyspnea, unspecified: Secondary | ICD-10-CM

## 2017-08-21 MED ORDER — OMEPRAZOLE 40 MG PO CPDR: DELAYED_RELEASE_CAPSULE | ORAL | 3 refills | Status: DC

## 2017-08-21 MED ORDER — METOPROLOL TARTRATE 50 MG PO TABS: 50.0000 mg | ORAL_TABLET | Freq: Two times a day (BID) | ORAL | 2 refills | Status: DC

## 2017-08-21 MED ORDER — LEVOTHYROXINE SODIUM 200 MCG PO TABS: 200.0000 ug | ORAL_TABLET | Freq: Every day | ORAL | 3 refills | Status: DC

## 2017-08-21 MED ORDER — TRANSDERM-SCOP (1.5 MG) 1 MG/3DAYS TD PT72: MEDICATED_PATCH | TRANSDERMAL | 2 refills | Status: DC

## 2017-08-21 NOTE — Progress Notes (Signed)
Subjective:     Patient ID: Deborah [name], female   DOB: [birth year],     MRN: [id number]  HPI   89 yobf last smoked 2012 at time of total laryngectomy/bilateral neck dissection at that time by [facility]/ followed by RT and chemo same year tolerated well for about a year then developed cough dx as copd  But never to her knowledge had spirometry prior to laryngectomy and  denies doe at time of dx  and was given spiriva dpi though using it by mouth (whereas breathes thru trach and only breaths out thru vocal appartus installed at and followed by Dionisio Paschal and [facility] referred to pulmonary clinic 08/21/2017 by Dr   Dallas Schimke for cough/worsening doe x 06/2017  ? Copd ?    08/21/2017 1st [facility] office visit/ Elsi Stelzer   Chief Complaint  Patient presents with  . Pulmonary Consult    Referred by Dr. Dallas Schimke. Pt c/o increased SOB and cough for the past month. Cough is mainly non prod and wakes her up every night. She is using her albuterol inhaler 4 x daily on average.   doe indoleent onset since first of 2019 gradually worse  = MMRC3 = can't walk 100 yards even at a slow pace at a flat grade s stopping due to sob  Assoc with gradually worsening dysphagia s/p dilation proximally by Dr Constance Holster one year prior to OV   No excess mucus but 24/ 7 harsh hacking coughing fits and no better with spiriva dpi by mouth Or saba/dulera per stoma and does not recall rx with prednione or nebs    No obvious day to day or daytime variability or assoc excess/ purulent sputum or mucus plugs or hemoptysis or cp or chest tightness, subjective wheeze or overt sinus or hb symptoms. No unusual exposure hx or h/o childhood pna/ asthma or knowledge of premature birth.   Also denies any obvious fluctuation of symptoms with weather or environmental changes or other aggravating or alleviating factors except as outlined above   Current Allergies, Complete Past Medical History, Past Surgical History, Family History, and Social History were  reviewed in [employer] record.  ROS  The following are not active complaints unless bolded Hoarseness, sore throat, dysphagia, dental problems, itching, sneezing,  nasal congestion or discharge of excess mucus or purulent secretions, ear ache,   fever, chills, sweats, unintended wt loss or wt gain, classically pleuritic or exertional cp,  orthopnea pnd or leg swelling, presyncope, palpitations, abdominal pain, anorexia, nausea, vomiting, diarrhea  or change in bowel habits or change in bladder habits, change in stools or change in urine, dysuria, hematuria,  rash, arthralgias, visual complaints, headache, numbness, weakness or ataxia or problems with walking or coordination,  change in mood/affect or memory.        Current Meds  Medication Sig  . buPROPion (WELLBUTRIN XL) 150 MG 24 hr tablet Take 1 tablet (150 mg total) by mouth daily.  [name] MELATONIN PO Take 3 mg by mouth at bedtime as needed (sleep).   . meloxicam (MOBIC) 7.5 MG tablet Take 1 tablet (7.5 mg total) by mouth daily.  . mometasone-formoterol (DULERA) 200-5 MCG/ACT AERO Inhale 2 puffs into the lungs 2 (two) times daily.  [name] nystatin cream (MYCOSTATIN) APPLY 1 APPLICATION TOPICALLY 2 TIMES DAILY UNTIL RASH RESOLVES (Patient taking differently: APPLY 1 APPLICATION TOPICALLY 2 TIMES DAILY AS NEEDED UNTIL RASH RESOLVES)  . omeprazole (PRILOSEC) 40 MG capsule Take 30- 60 min before your first and last  meals of the day  . PROVENTIL HFA 108 (90 Base) MCG/ACT inhaler INHALE TWO PUFFS BY MOUTH EVERY 4 HOURS AS NEEDED FOR WHEEZING FOR SHORTNESS OF BREATH  . Spacer/Aero Chamber Mouthpiece MISC Use spacer with all inhalers  . [ benzonatate (TESSALON) 100 MG capsule TAKE ONE CAPSULE BY MOUTH THREE TIMES DAILY AS NEEDED FOR COUGH  . [  levothyroxine (SYNTHROID, LEVOTHROID) 200 MCG tablet Take 1 tablet (200 mcg total) by mouth daily before breakfast.  . [ ]  loratadine (EQ ALLERGY RELIEF) 10 MG tablet Take 1 tablet (10 mg total)  by mouth daily.  . [ losartan (COZAAR) 100 MG tablet Take 1 tablet (100 mg total) by mouth daily.  . [ ]  metoprolol tartrate (LOPRESSOR) 25 MG tablet Take 1 tablet (25 mg total) by mouth 2 (two) times daily.  [name]   omeprazole (PRILOSEC) 40 MG capsule TAKE ONE CAPSULE BY MOUTH IN THE MORNING. TAKE AT LEAST 4 HOURS AFTER TAKING LEVOTHYROXINE  . [  tiotropium (SPIRIVA HANDIHALER) 18 MCG inhalation capsule PLACE ONE CAPSULE INTO INHALER AND INHALE DAILY.  . [  TRANSDERM-SCOP, 1.5 MG, 1 MG/3DAYS PLACE 1 PATCH ONTO THE SKIN EVERY 3 DAYS (Patient taking differently: PLACE 1 PATCH ONTO THE SKIN EVERY 3 DAYS AS NEEDED)          Review of Systems     Objective:   Physical Exam    obese bf with extremely harsh upper airway coughing fits  Wt Readings from Last 3 Encounters:  08/21/17 220 lb (99.8 kg)  08/20/17 221 lb 9.6 oz (100.5 kg)  07/27/17 229 lb (103.9 kg)     Vital signs reviewed - Note on arrival 02 sats  97% on RA        HEENT: nl dentition, turbinates bilaterally, and oropharynx. Nl external ear canals without cough reflex   NECK :  S/p bilateral neck dissection without apparent  JVD/Nodes/TM/ nl carotid upstrokes bilaterally/ trach site looks good    LUNGS: no acc muscle use,  Nl contour chest with prominent pseudowheeze/ transmitted upper airway noise    CV:  RRR  no s3 or murmur or increase in P2, and no edema   ABD:  Obese/ soft and nontender with nl inspiratory excursion in the supine position. No bruits or organomegaly appreciated, bowel sounds nl  MS:  Nl gait/ ext warm without deformities, calf tenderness, cyanosis or clubbing No obvious joint restrictions   SKIN: warm and dry without lesions    NEURO:  alert, approp, nl sensorium with  no motor or cerebellar deficits apparent.    CXR PA and Lateral:   08/21/2017 :    I personally reviewed images and agree with radiology impression as follows:   No active cardiopulmonary disease.      Assessment:

## 2017-08-21 NOTE — Telephone Encounter (Signed)
received request for ibuprofen, not on pts med list. Caron Tardif, Salome Spotted, CMA

## 2017-08-21 NOTE — Patient Instructions (Addendum)
Start taking prilosec 40 mg Take 30- 60 min before your first and last meals of the day   GERD (REFLUX)  is an extremely common cause of respiratory symptoms just like yours , many times with no obvious heartburn at all.    It can be treated with medication, but also with lifestyle changes including elevation of the head of your bed (ideally with 6 inch  bed blocks),  Smoking cessation, avoidance of late meals, excessive alcohol, and avoid fatty foods, chocolate, peppermint, colas, red wine, and acidic juices such as orange juice.  NO MINT OR MENTHOL PRODUCTS SO NO COUGH DROPS   USE SUGARLESS CANDY INSTEAD (Jolley ranchers or Stover's or Life Savers) or even ice chips will also do - the key is to swallow to prevent all throat clearing. NO OIL BASED VITAMINS - use powdered substitutes.    Stop cozar (losartan)   Increase lopressor to 50mg  twice daily   For drainage / throat tickle try take CHLORPHENIRAMINE  4 mg - take one every 4 hours as needed - available over the counter- may cause drowsiness so start with just a bedtime dose or two and see how you tolerate it before trying in daytime     Please remember to go to the  x-ray department downstairs in the basement  for your tests - we will call you with the results when they are available.      Please schedule a follow up office visit in 4 weeks, sooner if needed  with all medications /inhalers/ solutions in hand so we can verify exactly what you are taking. This includes all medications from all doctors and over the counters

## 2017-08-21 NOTE — Telephone Encounter (Signed)
Called patient back. She does not need refill of ibuprofen. She wanted me to send Synthroid and scopolomine rx to [facility] as [facility] is out of stock. rx sent

## 2017-08-21 NOTE — Progress Notes (Signed)
Anesthesia Chart Review:  Pt is a 62 year old female scheduled for esophageal dilation on 08/26/2017 with [name], MD  - PCP is Dennie Fetters, MD. Last office visit 08/24/17. Echo ordered today due to DOE symptoms.   - Pulmonologist is Christinia Gully, MD. Last office visit 08/21/17  Pt evaluated by PCP and referred to pulmonology for recent DOE/SOB/orthopnea over last month.  Dr. 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  Former smoker quit 2012).  BMI 34.5.  - ED visit 08/13/17 for dislodged tracheoesophageal prosthesis (TEP). Dr. [name] with ENT saw pt for TEP in the airway. Pt coughed it up and out of her airway.   Medications include: Levothyroxine, Dulera, albuterol  BP (!) 157/88   Pulse 100   Temp 36.7 C   Resp 18   Ht 5\' 7"  (1.702 m)   Wt 221 lb 9.6 oz (100.5 kg)   SpO2 98%   BMI 34.71 kg/m   Preoperative labs reviewed.    CXR 08/21/17: No active cardiopulmonary disease.  EKG 08/20/17: NSR. LVH. Cannot rule out Inferior infarct, age undetermined. T wave abnormality, consider lateral ischemia  Echo 04/29/14:  - Left ventricle: The cavity size was normal. There was mild focalbasal hypertrophy of the septum. Systolic function was normal.The estimated ejection fraction was in the range of 55% to 60%.Images were inadequate for LV wall motion assessment. - Aortic valve: There was moderate regurgitation. - Pulmonic valve: There was trivial regurgitation. - Pericardium, extracardiac: A trivial pericardial effusion wasidentified posterior to the  heart.   Reviewed case with Dr. Ermalene Postin.  Pt will need to complete DOE work up (has echo scheduled 08/31/17) prior to surgery.  I notified Olivia Mackie in Dr. Janeice Robinson office.   Willeen Cass, FNP-BC [facility]/Anesthesiology Phone: [phone] 08/24/2017 12:25 PM

## 2017-08-22 ENCOUNTER — Encounter: Payer: Self-pay | Admitting: Internal Medicine

## 2017-08-22 NOTE — Assessment & Plan Note (Addendum)
08/21/2017   Walked RA x one lap @ 185 stopped due to  Sob with nl sats / rapid pace   Symptoms are markedly disproportionate to objective findings and not clear to what extent this is actually a pulmonary  problem but pt does appear to have difficult to sort out respiratory symptoms of unknown origin for which  DDX  = almost all start with A and  include Adherence, Ace Inhibitors, Acid Reflux, Active Sinus Disease, Alpha 1 Antitripsin deficiency, Anxiety masquerading as Airways dz,  ABPA,  Allergy(esp in young), Aspiration (esp in elderly), Adverse effects of meds,  Active smokers, A bunch of PE's/clot burden (a few small clots can't cause this syndrome unless there is already severe underlying pulm or vascular dz with poor reserve),  Anemia or thyroid disorder, plus two Bs  = Bronchiectasis and Beta blocker use..and one C= CHF     Adherence is always the initial "prime suspect" and is a multilayered concern that requires a "trust but verify" approach in every patient - starting with knowing how to use medications, especially inhalers, correctly, keeping up with refills and understanding the fundamental difference between maintenance and prns vs those medications only taken for a very short course and then stopped and not refilled.  - return with all meds in hand using a trust but verify approach to confirm accurate Medication  Reconciliation The principal here is that until we are certain that the  patients are doing what we've asked, it makes no sense to ask them to do more.   ? Acid (or non-acid) GERD/ es dysfunction related to prior RT > always difficult to exclude as up to 75% of pts in some series report no assoc GI/ Heartburn symptoms> rec max (24h)  acid suppression and diet restrictions/ reviewed and instructions given in writing/ proceed with ENT rx as planned  ? ACEi/ ARB effects on the upper airway - For reasons that may related to vascular permability and nitric oxide pathways but not elevated   bradykinin levels (as seen with  ACEi use) losartan in the generic form has been reported now from mulitple sources  to cause a similar pattern of non-specific  upper airway symptoms as seen with acei.   This has not been reported with exposure to the other ARB's to date, so it seems reasonable for now to  use an alternative class altogether.  See hbp See:  Lelon Frohlich Allergy Asthma Immunol  2008: 101: p [phone]    ? Anxiety/depression/ deconditioning  > usually at the bottom of this list of usual suspects but should be   higher on this pt's based on H and P and note already on psychotropics .    ? Allergy/ asthma > no evidence for this/ ok to use Lopressor here/ stop dulera and spriiva and just use saba prn and for pnds try 1st gen H1 blockers per guidelines    ? A bunch of PE's > unlikely with such prominent assoc upper airway symptoms and such good sats walking   ? Anemia/ thyroid dz Lab Results  Component Value Date   HGB 11.7 (L) 08/20/2017   HGB 11.7 (L) 10/15/2016   HGB 12.0 06/30/2016   HGB 11.9 08/20/2015   HGB 10.6 (L) 08/04/2014   HGB 11.4 (L) 11/23/2012    Lab Results  Component Value Date   TSH 35.000 (H) 07/27/2017   Follow up per Primary Care planned  Now on synthroid 200 mcg daily   ? CHF > no  evidence for this    Will regroup in 4 weeks    Total time devoted to counseling  > 50 % of initial 60 min office visit:  review case with pt/ discussion of options/alternatives/ personally creating written customized instructions  in presence of pt  then going over those specific  Instructions directly with the pt including how to use all of the meds but in particular covering each new medication in detail and the difference between the maintenance= "automatic" meds and the prns using an action plan format for the latter (If this problem/symptom => do that organization reading Left to right).  Please see AVS from this visit for a full list of these instructions which I personally wrote  for this pt and  are unique to this visit.

## 2017-08-22 NOTE — Assessment & Plan Note (Addendum)
Changed from arb and low dose lopressor to just lopressor 50 mg bid due to uacs   Upper airway cough syndrome (previously labeled PNDS),  is so named because it's frequently impossible to sort out how much is  CR/sinusitis with freq throat clearing (which can be related to primary GERD)   vs  causing  secondary (" extra esophageal")  GERD from wide swings in gastric pressure that occur with throat clearing, often  promoting self use of mint and menthol lozenges that reduce the lower esophageal sphincter tone and exacerbate the problem further in a cyclical fashion.   These are the same pts (now being labeled as having "irritable larynx syndrome" by some cough centers) who not infrequently have a history of having failed to tolerate ace inhibitors as was the case here and sometimes generic losartan and   dry powder inhalers like spiriva or biphosphonates or report having atypical/extraesophageal reflux symptoms that don't respond to standard doses of PPI  and are easily confused as having aecopd or asthma flares by even experienced allergists/ pulmonologists (myself included).   Try just lopressor 50 mg bid for now

## 2017-08-22 NOTE — Assessment & Plan Note (Signed)
 >>ASSESSMENT AND PLAN FOR DOE (DYSPNEA ON EXERTION) WRITTEN ON 08/22/2017 11:21 AM BY Sandrea Hughs B, MD  08/21/2017   Walked RA x one lap @ 185 stopped due to  Sob with nl sats / rapid pace   Symptoms are markedly disproportionate to objective findings and not clear to what extent this is actually a pulmonary  problem but pt does appear to have difficult to sort out respiratory symptoms of unknown origin for which  DDX  = almost all start with A and  include Adherence, Ace Inhibitors, Acid Reflux, Active Sinus Disease, Alpha 1 Antitripsin deficiency, Anxiety masquerading as Airways dz,  ABPA,  Allergy(esp in young), Aspiration (esp in elderly), Adverse effects of meds,  Active smokers, A bunch of PE's/clot burden (a few small clots can't cause this syndrome unless there is already severe underlying pulm or vascular dz with poor reserve),  Anemia or thyroid disorder, plus two Bs  = Bronchiectasis and Beta blocker use..and one C= CHF     Adherence is always the initial "prime suspect" and is a multilayered concern that requires a "trust but verify" approach in every patient - starting with knowing how to use medications, especially inhalers, correctly, keeping up with refills and understanding the fundamental difference between maintenance and prns vs those medications only taken for a very short course and then stopped and not refilled.  - return with all meds in hand using a trust but verify approach to confirm accurate Medication  Reconciliation The principal here is that until we are certain that the  patients are doing what we've asked, it makes no sense to ask them to do more.   ? Acid (or non-acid) GERD/ es dysfunction related to prior RT > always difficult to exclude as up to 75% of pts in some series report no assoc GI/ Heartburn symptoms> rec max (24h)  acid suppression and diet restrictions/ reviewed and instructions given in writing/ proceed with ENT rx as planned  ? ACEi/ ARB effects on the  upper airway - For reasons that may related to vascular permability and nitric oxide pathways but not elevated  bradykinin levels (as seen with  ACEi use) losartan in the generic form has been reported now from mulitple sources  to cause a similar pattern of non-specific  upper airway symptoms as seen with acei.   This has not been reported with exposure to the other ARB's to date, so it seems reasonable for now to  use an alternative class altogether.  See hbp See:  Dewayne Hatch Allergy Asthma Immunol  2008: 101: p [phone]    ? Anxiety/depression/ deconditioning  > usually at the bottom of this list of usual suspects but should be   higher on this pt's based on H and P and note already on psychotropics .    ? Allergy/ asthma > no evidence for this/ ok to use Lopressor here/ stop dulera and spriiva and just use saba prn and for pnds try 1st gen H1 blockers per guidelines    ? A bunch of PE's > unlikely with such prominent assoc upper airway symptoms and such good sats walking   ? Anemia/ thyroid dz Lab Results  Component Value Date   HGB 11.7 (L) 08/20/2017   HGB 11.7 (L) 10/15/2016   HGB 12.0 06/30/2016   HGB 11.9 08/20/2015   HGB 10.6 (L) 08/04/2014   HGB 11.4 (L) 11/23/2012    Lab Results  Component Value Date   TSH 35.000 (H) 07/27/2017  Follow up per Primary Care planned  Now on synthroid 200 mcg daily   ? CHF > no evidence for this    Will regroup in 4 weeks    Total time devoted to counseling  > 50 % of initial 60 min office visit:  review case with pt/ discussion of options/alternatives/ personally creating written customized instructions  in presence of pt  then going over those specific  Instructions directly with the pt including how to use all of the meds but in particular covering each new medication in detail and the difference between the maintenance= "automatic" meds and the prns using an action plan format for the latter (If this problem/symptom => do that organization  reading Left to right).  Please see AVS from this visit for a full list of these instructions which I personally wrote for this pt and  are unique to this visit.

## 2017-08-23 NOTE — Progress Notes (Signed)
   [facility] Phone: [phone]   Date of Visit: 08/24/2017   HPI:  Depression: -Unfortunately she has not been able to find a job yet.  This has been significantly affecting her mood.  But she does have a good support from her boyfriend. - At the last visit a month ago we started Wellbutrin.  She has not noticed any change since starting the medication.  She is not interested in behavioral therapy.  She knows that her frustrations with not able to find a job is was contributing to her mood. -Denies SI or HI  Obesity: -She has been trying to exercise regularly since we last saw her in clinic and she has noticed some improvement in her weight -She is mindful of what she eats  Hypothyroidism: -She is doing well with the increased dose of Synthroid.  Denies any side effects such as palpitations, diarrhea, sweating -Take medication appropriately  Dyspnea on exertion: -Reports of orthopnea for the past month or so and dyspnea on exertion and she noticed recently in the last few months -Seen by pulmonology this month.  Started on PPI for possible reflux contributing to cough. -Denies any swelling in her lower extremities -Does have a family history of heart disease.  Her father passed away at age 33 from an MI.  Mother had CAD which required bypass in her 11s.   ROS: See HPI.  [place]:  PMH: HTN COPD Hypothyroidism  OA knees  Pseudoseizures?  Depression/Anxiety  PHYSICAL EXAM: BP 128/60   Pulse 80   Temp 98 F (36.7 C) (Oral)   Wt 220 lb (99.8 kg)   SpO2 98%   BMI 34.46 kg/m  GEN: NAD HEENT: Atraumatic, normocephalic, neck supple, EOMI, sclera clear  CV: RRR, no murmurs, rubs, or gallops PULM: CTAB, normal effort ABD: Soft, nontender, nondistended, NABS, no organomegaly SKIN: No rash or cyanosis; warm and well-perfused EXTR: No lower extremity edema or calf tenderness PSYCH: Mood and affect euthymic, normal rate and volume of speech NEURO:  Awake, alert, no focal deficits grossly, normal speech   ASSESSMENT/PLAN:  Health maintenance:  -Sent shingles vaccine to pharmacy  DOE (dyspnea on exertion) Pulmonology system to think it is related.  Chest x-ray at that visit is unremarkable.  There orthopnea think at some point to go ahead and get an echo to further evaluate.  No chest pain associated with any of her symptoms.  She does have a family history of heart disease that should be kept in mind.  Depressed state Her main stressor is not able to find a job.  PHQ 9 3, not difficult at all.  Continue Wellbutrin 150 mg daily.  Offered behavioral health therapy but patient declined.  Obesity She is improving her diet and lifestyle habits.  I encouraged her to continue with positive changes.  Will obtain lipid panel today.  Dyspnea on exertion: It seems like pulmonology does not think this is lung related.  No overt signs of heart failure.  Will obtain echo to further evaluate.  No chest pain, but significant family history of heart disease.  Consider cardiology referral in the near future.  [name], MD PGY [place]

## 2017-08-24 ENCOUNTER — Encounter: Payer: Self-pay | Admitting: Internal Medicine

## 2017-08-24 ENCOUNTER — Ambulatory Visit: Payer: Medicaid Other | Admitting: Internal Medicine

## 2017-08-24 ENCOUNTER — Other Ambulatory Visit: Payer: Self-pay

## 2017-08-24 VITALS — BP 128/60 | HR 80 | Temp 98.0°F | Wt 220.0 lb

## 2017-08-24 DIAGNOSIS — Z6834 Body mass index (BMI) 34.0-34.9, adult: Secondary | ICD-10-CM

## 2017-08-24 DIAGNOSIS — F32A Depression, unspecified: Secondary | ICD-10-CM

## 2017-08-24 DIAGNOSIS — E669 Obesity, unspecified: Secondary | ICD-10-CM

## 2017-08-24 DIAGNOSIS — R0600 Dyspnea, unspecified: Secondary | ICD-10-CM

## 2017-08-24 DIAGNOSIS — F329 Major depressive disorder, single episode, unspecified: Secondary | ICD-10-CM | POA: Diagnosis not present

## 2017-08-24 DIAGNOSIS — R0609 Other forms of dyspnea: Secondary | ICD-10-CM

## 2017-08-24 MED ORDER — ZOSTER VAC RECOMB ADJUVANTED 50 MCG/0.5ML IM SUSR: 0.5000 mL | Freq: Once | INTRAMUSCULAR | 1 refills | Status: AC

## 2017-08-24 NOTE — Patient Instructions (Addendum)
We will get cholesterol test today  I ordered an ultrasound of your heart to assess your shortness of breath.  I sent the shingles vaccine to your pharmacy

## 2017-08-24 NOTE — Progress Notes (Signed)
Spoke with pt and notified of results per Dr. Wert. Pt verbalized understanding and denied any questions. 

## 2017-08-24 NOTE — Assessment & Plan Note (Signed)
Pulmonology system to think it is related.  Chest x-ray at that visit is unremarkable.  There orthopnea think at some point to go ahead and get an echo to further evaluate.  No chest pain associated with any of her symptoms.  She does have a family history of heart disease that should be kept in mind.

## 2017-08-24 NOTE — Assessment & Plan Note (Signed)
>>  ASSESSMENT AND PLAN FOR DOE (DYSPNEA ON EXERTION) WRITTEN ON 08/24/2017 10:09 AM BY Ottie Glazier, Marilu Favre, MD  Pulmonology system to think it is related.  Chest x-ray at that visit is unremarkable.  There orthopnea think at some point to go ahead and get an echo to further evaluate.  No chest pain associated with any of her symptoms.  She does have a family history of heart disease that should be kept in mind.

## 2017-08-24 NOTE — Assessment & Plan Note (Signed)
She is improving her diet and lifestyle habits.  I encouraged her to continue with positive changes.  Will obtain lipid panel today.

## 2017-08-24 NOTE — Assessment & Plan Note (Signed)
Her main stressor is not able to find a job.  PHQ 9 3, not difficult at all.  Continue Wellbutrin 150 mg daily.  Offered behavioral health therapy but patient declined.

## 2017-08-25 LAB — LIPID PANEL
Chol/HDL Ratio: 3.7 ratio (ref 0.0–4.4)
Cholesterol, Total: 186 mg/dL (ref 100–199)
HDL: 50 mg/dL (ref >39)
LDL Calculated: 112 mg/dL — ABNORMAL HIGH (ref 0–99)
Triglycerides: 120 mg/dL (ref 0–149)
VLDL CHOLESTEROL CAL: 24 mg/dL (ref 5–40)

## 2017-08-26 ENCOUNTER — Ambulatory Visit (HOSPITAL_COMMUNITY): Admission: RE | Admit: 2017-08-26 | Payer: Medicaid Other | Source: Ambulatory Visit | Admitting: Otolaryngology

## 2017-08-26 ENCOUNTER — Telehealth: Payer: Self-pay | Admitting: Internal Medicine

## 2017-08-26 ENCOUNTER — Encounter (HOSPITAL_COMMUNITY): Admission: RE | Payer: Self-pay | Source: Ambulatory Visit

## 2017-08-26 SURGERY — DILATION, ESOPHAGUS
Anesthesia: General

## 2017-08-26 NOTE — Telephone Encounter (Signed)
Called patient to inform of cholesterol level. She is on her way to a funeral so I told her that I will call her later.

## 2017-08-27 ENCOUNTER — Telehealth: Payer: Self-pay | Admitting: Internal Medicine

## 2017-08-27 NOTE — Telephone Encounter (Signed)
Pt called nurse line, advised of information below.  Wallace Cullens, RN

## 2017-08-27 NOTE — Telephone Encounter (Signed)
Attempted to call patient again but went to voicemail. Left message to call back.   I wanted to inform her that her cholesterol levels overall look great. Her LDL aka "bad cholesterol" is slightly elevated to 112 (we like it to be less than 100). There is NO indication to start a cholesterol medication currently. I would recommend that she monitors her diet and decrease her fat intake. I also would recommend starting some regular physical activity as exercise can also help improve cholesterol levels and is overall good for her health. Please inform if patient calls back.

## 2017-08-31 ENCOUNTER — Ambulatory Visit (HOSPITAL_COMMUNITY)
Admission: RE | Admit: 2017-08-31 | Discharge: 2017-08-31 | Disposition: A | Discharge status: Home / Self Care | Payer: Medicaid Other | Source: Ambulatory Visit | Attending: Family Medicine | Admitting: Family Medicine

## 2017-08-31 DIAGNOSIS — R0609 Other forms of dyspnea: Secondary | ICD-10-CM | POA: Diagnosis not present

## 2017-08-31 DIAGNOSIS — R0600 Dyspnea, unspecified: Secondary | ICD-10-CM

## 2017-08-31 DIAGNOSIS — I10 Essential (primary) hypertension: Secondary | ICD-10-CM | POA: Insufficient documentation

## 2017-08-31 DIAGNOSIS — J449 Chronic obstructive pulmonary disease, unspecified: Secondary | ICD-10-CM | POA: Diagnosis not present

## 2017-08-31 DIAGNOSIS — E119 Type 2 diabetes mellitus without complications: Secondary | ICD-10-CM | POA: Insufficient documentation

## 2017-08-31 DIAGNOSIS — I351 Nonrheumatic aortic (valve) insufficiency: Secondary | ICD-10-CM | POA: Diagnosis not present

## 2017-08-31 DIAGNOSIS — Z87891 Personal history of nicotine dependence: Secondary | ICD-10-CM | POA: Diagnosis not present

## 2017-08-31 NOTE — Progress Notes (Signed)
  Echocardiogram 2D Echocardiogram has been performed.  Jennette Dubin 08/31/2017, 2:39 PM

## 2017-09-02 ENCOUNTER — Telehealth: Payer: Self-pay | Admitting: Internal Medicine

## 2017-09-02 DIAGNOSIS — R0600 Dyspnea, unspecified: Secondary | ICD-10-CM

## 2017-09-02 DIAGNOSIS — R0609 Other forms of dyspnea: Principal | ICD-10-CM

## 2017-09-02 NOTE — Telephone Encounter (Signed)
Spoke with patient about ECHO. Patient okay with cardiology referral.

## 2017-09-18 ENCOUNTER — Encounter: Payer: Self-pay | Admitting: Internal Medicine

## 2017-09-18 ENCOUNTER — Ambulatory Visit (INDEPENDENT_AMBULATORY_CARE_PROVIDER_SITE_OTHER): Payer: Medicaid Other | Admitting: Internal Medicine

## 2017-09-18 DIAGNOSIS — R0600 Dyspnea, unspecified: Secondary | ICD-10-CM

## 2017-09-18 DIAGNOSIS — I10 Essential (primary) hypertension: Secondary | ICD-10-CM | POA: Diagnosis not present

## 2017-09-18 DIAGNOSIS — J45991 Cough variant asthma: Secondary | ICD-10-CM | POA: Diagnosis not present

## 2017-09-18 DIAGNOSIS — R0609 Other forms of dyspnea: Secondary | ICD-10-CM | POA: Diagnosis not present

## 2017-09-18 NOTE — Progress Notes (Signed)
Subjective:     Patient ID: Deborah [name], female   DOB: 1955-08-06,     MRN: [id number]     Brief patient profile:  53 yobf last smoked 2012 at time of total laryngectomy/bilateral neck dissection at that time by [facility]/ followed by RT and chemo same year tolerated well for about a year then developed cough dx as copd  But never to her knowledge had spirometry prior to laryngectomy and  denies doe at time of dx  and was given spiriva dpi though using it by mouth (whereas breathes thru trach and only breaths out thru vocal appartus installed at and followed by Dionisio Paschal and [facility] referred to pulmonary clinic 08/21/2017 by Dr   Dallas Schimke for cough/worsening doe x 06/2017  ? Copd ?    History of Present Illness  08/21/2017 1st [facility] office visit/ Deborah [name]   Chief Complaint  Patient presents with  . Pulmonary Consult    Referred by Dr. Dallas Schimke. Pt c/o increased SOB and cough for the past month. Cough is mainly non prod and wakes her up every night. She is using her albuterol inhaler 4 x daily on average.   doe indoleent onset since first of 2019 gradually worse  = MMRC3 = can't walk 100 yards even at a slow pace at a flat grade s stopping due to sob  Assoc with gradually worsening dysphagia s/p dilation proximally by Dr Constance Holster one year prior to OV   No excess mucus but 24/ 7 harsh hacking coughing fits and no better with spiriva dpi by mouth Or saba/dulera per stoma and does not recall rx with prednione or nebs  rec Start  taking prilosec 40 mg Take 30- 60 min before your first and last meals of the day  GERD  Diet  Stop cozar (losartan)  Increase lopressor to 50mg  twice daily  For drainage / throat tickle try take CHLORPHENIRAMINE  4 mg - take one every 4 hours as needed   Please schedule a follow up office visit in 4 weeks, sooner if needed  with all medications /inhalers/ solutions in hand so we can verify exactly what you are taking. This includes all medications from all doctors and over  the counters    09/18/2017  f/u ov/H[facility] Widener re:  Coughsince early Jan 2019 / no meds no h1  Chief Complaint  Patient presents with  . Follow-up    Cough has not improved. She states she wakes up in the night coughing and "gagging".  She is using her albuterol inhaler 2 x per day.    Dyspnea:  Mailbox and back a struggle Cough: mucoid only Sleep: disturbed by cough  SABA use:  Up to bid and very confused on how to use resp  meds or if has neb/ has dulera and spacer and did not bring  But not using as maint rx apparently    No obvious day to day or daytime variability or assoc excess/ purulent sputum or mucus plugs or hemoptysis or cp or chest tightness, subjective wheeze or overt sinus or hb symptoms. No unusual exposure hx or h/o childhood pna/ asthma or knowledge of premature birth.   . Also denies any obvious fluctuation of symptoms with weather or environmental changes or other aggravating or alleviating factors except as outlined above   Current Allergies, Complete Past Medical History, Past Surgical History, Family History, and Social History were reviewed in [employer] record.  ROS  The following are not active complaints  unless bolded Hoarseness, sore throat, dysphagia, dental problems, itching, sneezing,  nasal congestion or discharge of excess mucus or purulent secretions, ear ache,   fever, chills, sweats, unintended wt loss or wt gain, classically pleuritic or exertional cp,  orthopnea pnd or leg swelling, presyncope, palpitations, abdominal pain, anorexia, nausea, vomiting, diarrhea  or change in bowel habits or change in bladder habits, change in stools or change in urine, dysuria, hematuria,  rash, arthralgias, visual complaints, headache, numbness, weakness or ataxia or problems with walking or coordination,  change in mood/affect or memory.        Current Meds  Medication Sig  . buPROPion (WELLBUTRIN XL) 150 MG 24 hr tablet Take 1 tablet (150 mg total) by  mouth daily.  [name] levothyroxine (SYNTHROID, LEVOTHROID) 200 MCG tablet Take 1 tablet (200 mcg total) by mouth daily before breakfast.  . MELATONIN PO Take 3 mg by mouth at bedtime as needed (sleep).   . meloxicam (MOBIC) 7.5 MG tablet Take 1 tablet (7.5 mg total) by mouth daily.  . metoprolol tartrate (LOPRESSOR) 50 MG tablet Take 1 tablet (50 mg total) by mouth 2 (two) times daily.  [name] nystatin cream (MYCOSTATIN) APPLY 1 APPLICATION TOPICALLY 2 TIMES DAILY UNTIL RASH RESOLVES (Patient taking differently: APPLY 1 APPLICATION TOPICALLY 2 TIMES DAILY AS NEEDED UNTIL RASH RESOLVES)  . omeprazole (PRILOSEC) 40 MG capsule Take 30- 60 min before your first and last meals of the day  . PROVENTIL HFA 108 (90 Base) MCG/ACT inhaler INHALE TWO PUFFS BY MOUTH EVERY 4 HOURS AS NEEDED FOR WHEEZING FOR SHORTNESS OF BREATH  . Spacer/Aero Chamber Mouthpiece MISC Use spacer with all inhalers  . TRANSDERM-SCOP, 1.5 MG, 1 MG/3DAYS PLACE 1 PATCH ONTO THE SKIN EVERY 3 DAYS AS NEEDED                   Objective:   Physical Exam  Obese bf with harsh coughing fits    09/18/2017        221   08/21/17 220 lb (99.8 kg)  08/20/17 221 lb 9.6 oz (100.5 kg)  07/27/17 229 lb (103.9 kg)      Vital signs reviewed - Note on arrival 02 sats  99% on RA and BP  124/70      HEENT: nl dentition, turbinates bilaterally, and oropharynx. Nl external ear canals without cough reflex/ trach in place  NECK :  without JVD/Nodes/TM/ nl carotid upstrokes bilaterally   LUNGS: no acc muscle use,  Nl contour chest which is clear to A and P bilaterally without cough on insp or exp maneuvers   CV:  RRR  no s3 or murmur or increase in P2, and no edema   ABD:  soft and nontender with nl inspiratory excursion in the supine position. No bruits or organomegaly appreciated, bowel sounds nl  MS:  Nl gait/ ext warm without deformities, calf tenderness, cyanosis or clubbing No obvious joint restrictions   SKIN: warm and dry without  lesions    NEURO:  alert, approp, nl sensorium with  no motor or cerebellar deficits apparent.         CXR PA and Lateral:   08/21/2017 :    I personally reviewed images and agree with radiology impression as follows:   No active cardiopulmonary disease.      Assessment:

## 2017-09-18 NOTE — Patient Instructions (Addendum)
Plan A = Automatic = dulera Take 2 puffs first thing in am and then another 2 puffs about 12 hours later.    Plan B = Backup Only use your albuterol as a rescue medication to be used if you can't catch your breath by resting or doing a relaxed purse lip breathing pattern.  - The less you use it, the better it will work when you need it. - Ok to use the inhaler up to 2 puffs  every 4 hours if you must but call for appointment if use goes up over your usual need - Don't leave home without it !!  (think of it like the spare tire for your car)    For drainage / throat tickle try take CHLORPHENIRAMINE  4 mg - take one every 4 hours as needed - available over the counter- may cause drowsiness so start with just a bedtime dose or two and see how you tolerate it before trying in daytime    Please schedule a follow up office visit in 4 weeks, sooner if needed with all your respiratory medications and devices

## 2017-09-20 ENCOUNTER — Encounter: Payer: Self-pay | Admitting: Internal Medicine

## 2017-09-20 DIAGNOSIS — J45991 Cough variant asthma: Secondary | ICD-10-CM | POA: Insufficient documentation

## 2017-09-20 NOTE — Assessment & Plan Note (Signed)
Changed from arb and low dose lopressor to just lopressor 50 mg bid due to uacs   Although even in retrospect it may not be clear the ARBcontributed to the pt's symptoms,   adding them back at this point  would risk confusion in interpretation of non-specific respiratory symptoms to which this patient is prone  ie  Better not to muddy the waters here.

## 2017-09-20 NOTE — Assessment & Plan Note (Signed)
08/21/2017   Walked RA x one lap @ 185 stopped due to  Sob with nl sats / rapid pace  - 09/18/2017   Walked RA  2 laps @ 185 ft each stopped due to  Sob / no desats / slow pace  No pulmonary findings at this point, suspect all of her problems are upper airway related

## 2017-09-20 NOTE — Assessment & Plan Note (Signed)
>>  ASSESSMENT AND PLAN FOR DOE (DYSPNEA ON EXERTION) WRITTEN ON 09/20/2017  6:05 AM BY Sandrea Hughs B, MD  08/21/2017   Walked RA x one lap @ 185 stopped due to  Sob with nl sats / rapid pace  - 09/18/2017   Walked RA  2 laps @ 185 ft each stopped due to  Sob / no desats / slow pace  No pulmonary findings at this point, suspect all of her problems are upper airway related

## 2017-09-20 NOTE — Assessment & Plan Note (Addendum)
Not clear how much if any asthma she has vs all uacs related to RT  - The proper method of use, as well as anticipated side effects, of a metered-dose inhaler are discussed and demonstrated to the patient. Strongly rec she bring all meds /devices with her on return to verify she's using them correctly and in the meantime instructed on maint vs prns and rec restart dulera in whatever strength she has at 2 bid on trial basis only and also add 1st gen H1 blockers per guidelines  For uacs   I had an extended discussion with the patient reviewing all relevant studies completed to date and  lasting 15 to 20 minutes of a 25 minute visit    Each maintenance medication was reviewed in detail including most importantly the difference between maintenance and prns and under what circumstances the prns are to be triggered using an action plan format that is not reflected in the computer generated alphabetically organized AVS.    Please see AVS for specific instructions unique to this visit that I personally wrote and verbalized to the the pt in detail and then reviewed with pt  by my nurse highlighting any  changes in therapy recommended at today's visit to their plan of care.

## 2017-09-22 ENCOUNTER — Encounter (HOSPITAL_COMMUNITY): Payer: Self-pay | Admitting: Dentistry

## 2017-10-01 ENCOUNTER — Encounter: Payer: Self-pay | Admitting: *Deleted

## 2017-10-01 ENCOUNTER — Encounter: Payer: Self-pay | Admitting: Physician Assistant

## 2017-10-01 ENCOUNTER — Ambulatory Visit (INDEPENDENT_AMBULATORY_CARE_PROVIDER_SITE_OTHER): Payer: Medicaid Other | Admitting: Physician Assistant

## 2017-10-01 VITALS — BP 148/90 | HR 95 | Ht 67.0 in | Wt 225.4 lb

## 2017-10-01 DIAGNOSIS — I10 Essential (primary) hypertension: Secondary | ICD-10-CM

## 2017-10-01 DIAGNOSIS — R0600 Dyspnea, unspecified: Secondary | ICD-10-CM

## 2017-10-01 DIAGNOSIS — Z01818 Encounter for other preprocedural examination: Secondary | ICD-10-CM | POA: Diagnosis not present

## 2017-10-01 DIAGNOSIS — I5032 Chronic diastolic (congestive) heart failure: Secondary | ICD-10-CM | POA: Diagnosis not present

## 2017-10-01 DIAGNOSIS — R0609 Other forms of dyspnea: Secondary | ICD-10-CM

## 2017-10-01 MED ORDER — FUROSEMIDE 20 MG PO TABS: 20.0000 mg | ORAL_TABLET | Freq: Every day | ORAL | 1 refills | Status: DC | PRN

## 2017-10-01 NOTE — Patient Instructions (Signed)
Medication Instructions:  1) You may take Lasix (Furosemide) 20mg  once daily as needed for swelling or shortness of breath.   Labwork: None  Testing/Procedures: Your physician has requested that you have a lexiscan myoview. For further information please visit [email or web address]. Please follow instruction sheet, as given.   Follow-Up: Your physician recommends that you schedule a follow-up appointment- based on your results.   Any Other Special Instructions Will Be Listed Below (If Applicable).     If you need a refill on your cardiac medications before your next appointment, please call your pharmacy.

## 2017-10-01 NOTE — Progress Notes (Signed)
Cardiology Office Note    Date:  10/01/2017   ID:  Deborah [name], Deborah [name] 1956/01/25, MRN [id number]  PCP:  [name], MD  Cardiologist: New to Dr. Angelena Form   Chief Complaint: DOE   History of Present Illness:   Deborah [name] is a 62 y.o. female with hx of hypothyroidism, pseudoseizure, depression, HTN and s/p total laryngectomy/bilateral neck dissection  In 2012 (stopped smoking this time & has chronic trach) referred by Dr. Dallas Schimke for evaluation of DOE.   The patient has DOE for few months. Seen by pulmonary for cough/DOE and possible COPD. Started protonix. Stop Cozar and increased lopressor to 50mg  BID. Felt her symptoms are upper airway related.   Echo 08/31/17 showed LVEF of 65-70%, grade 2 DD, mildly increased PA pressure to 35 mm Hg and moderate LVH.   She is schedule to have widening of esophageus for dysphagia in near future. She occasionally wakes up at night chocking and dyspneic. She has DOE for the past 3-4 months without chest heaviness. Limited ambulation due to dyspnea. No palpitations, syncope, melena or blood in her stool or urine.   Her father passed away at age 60 from an MI.  Mother had CAD which required bypass in her 9s. Brother had MI at age 94 (had terminal cancer). Sister had CABG at 6.   Past Medical History:  Diagnosis Date  . Anemia   . Arthritis    in knees  . Asthma   . Bronchitis   . COPD (chronic obstructive pulmonary disease) ([place])   . Diverticulosis 11/15/2012    noted on screening colonoscopy   . Dyspnea    with COPD exerbation  . Esophageal stricture   . Former smoker 03/19/2011  . GERD (gastroesophageal reflux disease)   . Heart murmur    asymptomatic   . History of laryngectomy   . 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 Hypertension   . 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 Internal hemorrhoid 11/15/2012    small, noted on screening colonoscopy   . 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  . Leukocytopenia   . Nausea alone 07/28/2013  . Neck pain 01/21/2012  . Normal MRI 07/14/11   negative for mestasis   . Pneumonia 2012  . Sciatica   . Seizures ([place])    07/24/11 off Effexor w/o seizure  . Sepsis ([place]) 08/04/12  . Sinusitis, chronic 07/20/2011  . Sinusitis, chronic 07/20/2011   Bilateral maxillary, identified on MRI of head 07/14/11.    . Tracheostomy dependent [facility])     Past Surgical History:  Procedure Laterality Date  . COLONOSCOPY N/A 11/15/2012   Procedure: COLONOSCOPY;  Surgeon: Lafayette Dragon, MD;  Location: [facility];  Service: Endoscopy;  Laterality: N/A;  . DENTAL RESTORATION/EXTRACTION WITH X-RAY    . ESOPHAGOSCOPY  06/21/2012   Procedure: ESOPHAGOSCOPY;  Surgeon: [name], MD;  Location: [facility];  Service: ENT;  Laterality: N/A;  . ESOPHAGOSCOPY WITH DILITATION N/A 09/21/2014   Procedure: ESOPHAGOSCOPY WITH DILITATION;  Surgeon: [name], MD;  Location: [facility];  Service: ENT;  Laterality: N/A;  . ESOPHAGOSCOPY WITH DILITATION N/A 07/04/2016   Procedure: ESOPHAGOSCOPY WITH DILITATION;  Surgeon: [name], MD;  Location: [facility];  Service: ENT;  Laterality: N/A;  . FOREIGN BODY REMOVAL BRONCHIAL  10/02/2011   Procedure: REMOVAL FOREIGN BODY BRONCHIAL;  Surgeon: Ruby Cola, MD;  [facility];  Service: ENT;  Laterality: N/A;  . LARYNGECTOMY    . Porta cath removal    . PORTACATH PLACEMENT  09/17/10   Tip in cavoatrial junction  . STOMAPLASTY N/A 10/21/2016   Procedure: Zola Button;  Surgeon: [name], MD;  Location: [facility];  Service: ENT;  Laterality: N/A;  . TRACHEAL DILITATION  07/16/2011   Procedure: TRACHEAL DILITATION;  Surgeon: Beckie Salts, MD;  Location: [facility];  Service: ENT;  Laterality: N/A;  dilation of tracheal stoma and replacement of stoma tube  . TUBAL LIGATION  1982    Current Medications: Prior to Admission medications   Medication Sig Start Date End Date Taking?  Authorizing Provider  buPROPion (WELLBUTRIN XL) 150 MG 24 hr tablet Take 1 tablet (150 mg total) by mouth daily. 07/27/17   [name], MD  levothyroxine (SYNTHROID, LEVOTHROID) 200 MCG tablet Take 1 tablet (200 mcg total) by mouth daily before breakfast. 08/21/17   [name], MD  MELATONIN PO Take 3 mg by mouth at bedtime as needed (sleep).     [provider]  meloxicam (MOBIC) 7.5 MG tablet Take 1 tablet (7.5 mg total) by mouth daily. 08/17/17   [name], MD  metoprolol tartrate (LOPRESSOR) 50 MG tablet Take 1 tablet (50 mg total) by mouth 2 (two) times daily. 08/21/17   Tanda Rockers, MD  nystatin cream (MYCOSTATIN) APPLY 1 APPLICATION TOPICALLY 2 TIMES DAILY UNTIL RASH RESOLVES Patient taking differently: APPLY 1 APPLICATION TOPICALLY 2 TIMES DAILY AS NEEDED UNTIL RASH RESOLVES 01/14/17   [name], MD  omeprazole (PRILOSEC) 40 MG capsule Take 30- 60 min before your first and last meals of the day 08/21/17   Tanda Rockers, MD  PROVENTIL HFA 108 [phone] Base) MCG/ACT inhaler INHALE TWO PUFFS BY MOUTH EVERY 4 HOURS AS NEEDED FOR WHEEZING FOR SHORTNESS OF BREATH 07/13/17   [name], MD  Spacer/Aero Chamber Mouthpiece MISC Use spacer with all inhalers 02/10/17   [name], MD  TRANSDERM-SCOP, 1.5 MG, 1 MG/3DAYS PLACE 1 PATCH ONTO THE SKIN EVERY 3 DAYS AS NEEDED 08/21/17   [name], MD    Allergies:   Lisinopril   Social History   Socioeconomic History  . Marital status: Divorced    Spouse name: Not on file  . Number of children: Not on file  . Years of education: 65  . Highest education level: Not on file  Occupational History  . Not on file  Social Needs  . Financial resource strain: Not on file  . Food insecurity:    Worry: Not on file    Inability: Not on file  . Transportation needs:    Medical: Not on file    Non-medical: Not on file  Tobacco Use  . Smoking status: Former Smoker    Packs/day: 2.00     Years: 20.00    Pack years: 40.00    Types: Cigarettes    Last attempt to quit: 09/17/2010    Years since quitting: 7.0  . Smokeless tobacco: Never Used  Substance and Sexual Activity  . Alcohol use: Yes    Comment: Socially drinks   . Drug use: No    Comment: tried cocaine 1 time 2010 only used 1 time  . Sexual activity: Yes    Birth control/protection: Surgical  Lifestyle  . Physical activity:    Days per week: Not on file    Minutes per session: Not on file  .  Stress: Not on file  Relationships  . Social connections:    Talks on phone: Not on file    Gets together: Not on file    Attends religious service: Not on file    Active member of club or organization: Not on file    Attends meetings of clubs or organizations: Not on file    Relationship status: Not on file  Other Topics Concern  . Not on file  Social History Narrative   Unemployed. Receives SSI. Lives with one of her 2 adult daughters Deborah [name]) and her sister Deborah [name])      Family History:  The patient's family history includes Heart disease in her father, mother, and sister.   ROS:   Please see the history of present illness.    ROS All other systems reviewed and are negative.   PHYSICAL EXAM:   VS:  BP (!) 148/90   Pulse 95   Ht 5\' 7"  (1.702 m)   Wt 225 lb 6.4 oz (102.2 kg)   SpO2 95%   BMI 35.30 kg/m    GEN: Well nourished, well developed, in no acute distress  HEENT: normal  Neck: no JVD, carotid bruits, or masses Cardiac: RRR; no murmurs, rubs, or gallops, Trace BL LE edema  L > R Respiratory:  clear to auscultation bilaterally, normal work of breathing GI: soft, nontender, nondistended, + BS MS: no deformity or atrophy  Skin: warm and dry, no rash Neuro:  Alert and Oriented x 3, Strength and sensation are intact Psych: euthymic mood, full affect  Wt Readings from Last 3 Encounters:  10/01/17 225 lb 6.4 oz (102.2 kg)  09/18/17 221 lb 9.6 oz (100.5 kg)  08/24/17 220 lb (99.8  kg)      Studies/Labs Reviewed:   EKG:  EKG is ordered today.  The ekg ordered today demonstrates NSR with chronic TWI in lead I & aVL  Recent Labs: 07/27/2017: TSH 35.000 08/20/2017: BUN 21; Creatinine, Ser 0.95; Hemoglobin 11.7; Platelets 394; Potassium 4.2; Sodium 138   Lipid Panel    Component Value Date/Time   CHOL 186 08/24/2017 0939   TRIG 120 08/24/2017 0939   HDL 50 08/24/2017 0939   CHOLHDL 3.7 08/24/2017 0939   CHOLHDL 2.9 03/20/2011 0840   VLDL 15 03/20/2011 0840   LDLCALC 112 (H) 08/24/2017 0939    Additional studies/ records that were reviewed today include:   Echocardiogram: 08/31/17 Study Conclusions  - Left ventricle: The cavity size was mildly reduced. Wall   thickness was increased in a pattern of moderate LVH. Systolic   function was vigorous. The estimated ejection fraction was in the   range of 65% to 70%. Wall motion was normal; there were no   regional wall motion abnormalities. Features are consistent with   a pseudonormal left ventricular filling pattern, with concomitant   abnormal relaxation and increased filling pressure (grade 2   diastolic dysfunction). - Aortic valve: There was mild regurgitation. - Pulmonary arteries: Systolic pressure was mildly increased. PA   peak pressure: 35 mm Hg (S).   ASSESSMENT & PLAN:    1. DOE - Seems her symptoms is likely related to deconditioning and chronic upper airway issue. However, can not r/o underlying diastolic CHF. Echo showed normal LVEF with grade 2 DD. ? Her episode at related to orthopnea or chocking/airway. Noted mild LE edema. She uses low sodium diet. Trial of PRN lasix 20mg . She has strong family hx of early CAD. Discussed with DOD Dr. Angelena Form.  Will get Myoview.   2. Pre-op clearance for widening of esophageus for dysphagia  - wait Myoview  3. HTN - Elevated today to 148/90. It was normal during past office visit per review of chart. However recently discontinued ARB for possible cough  and increase metoprolol to 50mg  BID. Will continue at current dose. She will keep log.   Medication Adjustments/Labs and Tests Ordered: Current medicines are reviewed at length with the patient today.  Concerns regarding medicines are outlined above.  Medication changes, Labs and Tests ordered today are listed in the Patient Instructions below. Patient Instructions  Medication Instructions:  1) You may take Lasix (Furosemide) 20mg  once daily as needed for swelling or shortness of breath.   Labwork: None  Testing/Procedures: Your physician has requested that you have a lexiscan myoview. For further information please visit [email or web address]. Please follow instruction sheet, as given.   Follow-Up: Your physician recommends that you schedule a follow-up appointment- based on your results.   Any Other Special Instructions Will Be Listed Below (If Applicable).     If you need a refill on your cardiac medications before your next appointment, please call your pharmacy.      Jarrett Soho, [place]  10/01/2017 3:05 PM    [facility] [address], [place], [place]  [address] Phone: [phone]; Fax: [phone]

## 2017-10-05 ENCOUNTER — Telehealth (HOSPITAL_COMMUNITY): Payer: Self-pay | Admitting: *Deleted

## 2017-10-05 NOTE — Telephone Encounter (Signed)
Left message on voicemail per DPR in reference to upcoming appointment scheduled on 10/07/17 at [facility] with detailed instructions given per Myocardial Perfusion Study Information Sheet for the test. LM to arrive 15 minutes early, and that it is imperative to arrive on time for appointment to keep from having the test rescheduled. If you need to cancel or reschedule your appointment, please call the office within 24 hours of your appointment. Failure to do so may result in a cancellation of your appointment, and a $50 no show fee. Phone number given for call back for any questions.

## 2017-10-07 ENCOUNTER — Ambulatory Visit (HOSPITAL_COMMUNITY): Payer: Medicaid Other | Attending: Cardiology

## 2017-10-07 DIAGNOSIS — Z8249 Family history of ischemic heart disease and other diseases of the circulatory system: Secondary | ICD-10-CM | POA: Insufficient documentation

## 2017-10-07 DIAGNOSIS — R0609 Other forms of dyspnea: Secondary | ICD-10-CM | POA: Diagnosis not present

## 2017-10-07 DIAGNOSIS — R0600 Dyspnea, unspecified: Secondary | ICD-10-CM

## 2017-10-07 LAB — MYOCARDIAL PERFUSION IMAGING
CHL CUP NUCLEAR SDS: 2
CHL CUP RESTING HR STRESS: 62 {beats}/min
LHR: 0.37
LVDIAVOL: 90 mL (ref 46–106)
LVSYSVOL: 35 mL
NUC STRESS TID: 0.9
Peak HR: 78 {beats}/min
SRS: 4
SSS: 6

## 2017-10-07 MED ORDER — TECHNETIUM TC 99M TETROFOSMIN IV KIT
10.2000 | PACK | Freq: Once | INTRAVENOUS | Status: AC | PRN
  Administered 2017-10-07: 10.2 via INTRAVENOUS
  Filled 2017-10-07: qty 11

## 2017-10-07 MED ORDER — TECHNETIUM TC 99M TETROFOSMIN IV KIT
30.4000 | PACK | Freq: Once | INTRAVENOUS | Status: AC | PRN
  Administered 2017-10-07: 30.4 via INTRAVENOUS
  Filled 2017-10-07: qty 31

## 2017-10-07 MED ORDER — REGADENOSON 0.4 MG/5ML IV SOLN
0.4000 mg | Freq: Once | INTRAVENOUS | Status: AC
  Administered 2017-10-07: 0.4 mg via INTRAVENOUS

## 2017-10-09 ENCOUNTER — Other Ambulatory Visit: Payer: Self-pay | Admitting: Obstetrics and Gynecology

## 2017-10-12 MED ORDER — NYSTATIN 100000 UNIT/GM EX CREA
TOPICAL_CREAM | CUTANEOUS | 0 refills | Status: DC
Start: 2017-10-12 — End: 2017-11-13

## 2017-10-12 MED ORDER — MELOXICAM 7.5 MG PO TABS: 7.5000 mg | ORAL_TABLET | Freq: Every day | ORAL | 0 refills | Status: DC

## 2017-10-16 ENCOUNTER — Ambulatory Visit: Payer: Self-pay | Admitting: Internal Medicine

## 2017-11-11 NOTE — H&P (Signed)
Deborah Scottis an 61 y.o.female.  Chief Complaint:Trouble swallowing HPI:History of total laryngectomy and radiation many years ago. Having some trouble swallowing again. Has had successful esophageal dilatation in the past.      Past Medical History:  Diagnosis Date  . Anemia   . Arthritis    in knees  . Asthma   . Bronchitis   . COPD (chronic obstructive pulmonary disease) (HCC)   . Diverticulosis 11/15/2012    noted on screening colonoscopy   . Dyspnea    with COPD exerbation  . Esophageal stricture   . Former smoker 03/19/2011  . GERD (gastroesophageal reflux disease)   . Heart murmur    asymptomatic   . History of laryngectomy   . 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 Hypertension   . 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 Internal hemorrhoid 11/15/2012    small, noted on screening colonoscopy   . 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  . Leukocytopenia   . Nausea alone 07/28/2013  . Neck pain 01/21/2012  . Normal MRI 07/14/11   negative for mestasis   . Pneumonia 2012  . Sciatica   . Seizures (HCC)    07/24/11 off Effexor w/o seizure  . Sepsis (HCC) 08/04/12  . Sinusitis, chronic 07/20/2011  . Sinusitis, chronic 07/20/2011   Bilateral maxillary, identified on MRI of head 07/14/11.   . 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HCC)          Past Surgical History:  Procedure Laterality Date  . COLONOSCOPY N/A 11/15/2012   Procedure: COLONOSCOPY; Surgeon: Dora M Brodie, MD; [facility]; Service: Endoscopy; Laterality: N/A;  . DENTAL RESTORATION/EXTRACTION WITH X-RAY    . ESOPHAGOSCOPY  06/21/2012   Procedure: ESOPHAGOSCOPY; Surgeon: Eloy Fehl, MD; Location: [facility]; Service: ENT; Laterality: N/A;  . ESOPHAGOSCOPY WITH DILITATION N/A 09/21/2014   Procedure: ESOPHAGOSCOPY WITH DILITATION; Surgeon: Mariapaula Krist, MD; [facility]; Service: ENT; Laterality: N/A;  . ESOPHAGOSCOPY WITH DILITATION N/A 07/04/2016   Procedure: ESOPHAGOSCOPY WITH DILITATION; Surgeon: Madline Oesterling, MD; [facility]; Service: ENT; Laterality: N/A;  . FOREIGN BODY REMOVAL BRONCHIAL  10/02/2011   Procedure: REMOVAL FOREIGN BODY BRONCHIAL; Surgeon: Mitchell Gore, MD; [facility]; Service: ENT; Laterality: N/A;  . LARYNGECTOMY    . Porta cath removal    . PORTACATH PLACEMENT  09/17/10   Tip in cavoatrial junction  . STOMAPLASTY N/A 10/21/2016   Procedure: STOMAPLASTY; Surgeon: Chidi Shirer, MD; [facility]; Service: ENT; Laterality: N/A;  . TRACHEAL DILITATION  07/16/2011   Procedure: TRACHEAL DILITATION; Surgeon: Fredrich Cory H Abhiram Criado, MD; [facility]; Service: ENT; Laterality: N/A; dilation of tracheal stoma and replacement of stoma tube  . TUBAL LIGATION  1982         Family History  Problem Relation Age of Onset  . Heart disease Mother   . Heart disease Father   . Heart disease Sister   . Breast cancer Neg Hx    Social History:reports that she quit smoking about 6 years ago. Her smoking use included cigarettes. She has a 40.00 pack-year smoking history. she has never used smokeless tobacco. She reports that she drinks alcohol. She reports that she does not use drugs.  Allergies:     Allergies  Allergen Reactions  . Lisinopril Cough    No medications prior to admission.    LabResultsLast48Hours  No results found for this or any previous visit (  from the past 48 hour(s)).   ImagingResults(Last48hours)  No results found.    ROS: otherwise negative  There were no vitals taken for this visit.  PHYSICAL EXAM: Overall appearance: Healthy appearing, in no distress Head: Normocephalic, atraumatic. Ears: External auditory canals are clear; tympanic membranes are intact and the middle ears are free of any effusion. Nose: External nose is healthy in  appearance. Internal nasal exam free of any lesions or obstruction. Oral Cavity/pharynx: There are no mucosal lesions or masses identified. Neuro: No identifiable neurologic deficits. Neck: No palpable neck masses.Stoma is clear. TEP functioning.  Studies Reviewed: none    Assessment/Plan Esophageal stricture following laryngectomy and radiation years ago. Proceed with esophagoscopy and dilatation.    

## 2017-11-13 ENCOUNTER — Other Ambulatory Visit: Payer: Self-pay | Admitting: Family Medicine

## 2017-11-13 ENCOUNTER — Other Ambulatory Visit: Payer: Self-pay

## 2017-11-13 ENCOUNTER — Other Ambulatory Visit: Payer: Self-pay | Admitting: Internal Medicine

## 2017-11-13 ENCOUNTER — Encounter: Payer: Self-pay | Admitting: Internal Medicine

## 2017-11-13 ENCOUNTER — Other Ambulatory Visit: Payer: Self-pay | Admitting: Obstetrics and Gynecology

## 2017-11-13 ENCOUNTER — Ambulatory Visit: Payer: Medicaid Other | Admitting: Internal Medicine

## 2017-11-13 VITALS — BP 174/98 | HR 82 | Temp 98.1°F | Ht 67.0 in | Wt 218.8 lb

## 2017-11-13 DIAGNOSIS — J441 Chronic obstructive pulmonary disease with (acute) exacerbation: Secondary | ICD-10-CM | POA: Diagnosis not present

## 2017-11-13 DIAGNOSIS — I10 Essential (primary) hypertension: Secondary | ICD-10-CM

## 2017-11-13 MED ORDER — PREDNISONE 50 MG PO TABS: 50.0000 mg | ORAL_TABLET | Freq: Every day | ORAL | 0 refills | Status: DC

## 2017-11-13 MED ORDER — AMLODIPINE BESYLATE 10 MG PO TABS: 10.0000 mg | ORAL_TABLET | Freq: Every day | ORAL | 0 refills | Status: DC

## 2017-11-13 MED ORDER — DOXYCYCLINE HYCLATE 100 MG PO TABS: 100.0000 mg | ORAL_TABLET | Freq: Two times a day (BID) | ORAL | 0 refills | Status: DC

## 2017-11-13 NOTE — Patient Instructions (Signed)
I am going to prescribe you doxycycline and prednisone.  If you do not start to feel better in the next 2 to 3 days please return for follow-up.

## 2017-11-13 NOTE — Assessment & Plan Note (Signed)
Mild COPD exacerbation. No SOB, increase sputum production and cough. Normal respiratory effort  - predniSONE (DELTASONE) 50 MG tablet; Take 1 tablet (50 mg total) by mouth daily with breakfast.  Dispense: 5 tablet; Refill: 0 - doxycycline (VIBRA-TABS) 100 MG tablet; Take 1 tablet (100 mg total) by mouth 2 (two) times daily.  Dispense: 14 tablet; Refill: 0

## 2017-11-13 NOTE — Telephone Encounter (Signed)
Per recent pulmonology note, patient was switched from ARB to metoprolol.

## 2017-11-13 NOTE — Progress Notes (Signed)
   [facility] [name], MD Phone: [phone]  Reason For Visit: SDA for COPD exacerbation   # COPD exacerbation  Has been sick since yesterday. Had larynx cancer about 8 years ago she had her nasal passage removed as well as her larynx.  She states that everything drains down from her sinuses into her trachea.  She states she has noticed globs of thick yellow mucus toda.  She stated that she had a fever of 101 yesterday.  She denies any issues with her shortness of breath.  She states that yesterday she felt pretty bad but today she is feeling better.  She normally gets prednisone and antibiotics for these exacerbations.   Notable for elevated blood pressure   Past Medical History Reviewed problem list.  Medications- reviewed and updated No additions to family history Social history- patient is a previous smoker  Objective: BP (!) 174/98   Pulse 82   Temp 98.1 F (36.7 C) (Oral)   Ht 5\' 7"  (1.702 m)   Wt 218 lb 12.8 oz (99.2 kg)   SpO2 96%   BMI 34.27 kg/m  Gen: NAD, alert, cooperative with exam HEENT: Normal    Neck: No masses palpated. No lymphadenopathy    Nose: nasal turbinates moist    Throat: moist mucus membranes,  Cardio: regular rate and rhythm, S1S2 heard, no murmurs appreciated Pulm: clear to auscultation bilaterally, no wheezes, rhonchi or rales Skin: dry, intact, no rashes or lesions   Assessment/Plan: See problem based a/p  No problem-specific Assessment & Plan notes found for this encounter.

## 2017-11-13 NOTE — Assessment & Plan Note (Signed)
Given blood pressure elevated today  Will start Norvasc  Follow up in 1 week with PCP

## 2017-11-23 ENCOUNTER — Other Ambulatory Visit: Payer: Self-pay

## 2017-11-23 ENCOUNTER — Encounter (HOSPITAL_COMMUNITY): Payer: Self-pay

## 2017-11-23 ENCOUNTER — Encounter (HOSPITAL_COMMUNITY)
Admission: RE | Admit: 2017-11-23 | Discharge: 2017-11-23 | Disposition: A | Discharge status: Home / Self Care | Payer: Medicaid Other | Source: Ambulatory Visit | Attending: Otolaryngology | Admitting: Otolaryngology

## 2017-11-23 DIAGNOSIS — Z9889 Other specified postprocedural states: Secondary | ICD-10-CM | POA: Insufficient documentation

## 2017-11-23 DIAGNOSIS — R011 Cardiac murmur, unspecified: Secondary | ICD-10-CM | POA: Diagnosis not present

## 2017-11-23 DIAGNOSIS — Z01812 Encounter for preprocedural laboratory examination: Secondary | ICD-10-CM | POA: Diagnosis not present

## 2017-11-23 DIAGNOSIS — J449 Chronic obstructive pulmonary disease, unspecified: Secondary | ICD-10-CM | POA: Insufficient documentation

## 2017-11-23 DIAGNOSIS — Z79899 Other long term (current) drug therapy: Secondary | ICD-10-CM | POA: Insufficient documentation

## 2017-11-23 DIAGNOSIS — D649 Anemia, unspecified: Secondary | ICD-10-CM | POA: Diagnosis not present

## 2017-11-23 DIAGNOSIS — Z923 Personal history of irradiation: Secondary | ICD-10-CM | POA: Diagnosis not present

## 2017-11-23 DIAGNOSIS — Z8521 Personal history of malignant neoplasm of larynx: Secondary | ICD-10-CM | POA: Diagnosis not present

## 2017-11-23 DIAGNOSIS — Z9221 Personal history of antineoplastic chemotherapy: Secondary | ICD-10-CM | POA: Diagnosis not present

## 2017-11-23 DIAGNOSIS — E039 Hypothyroidism, unspecified: Secondary | ICD-10-CM | POA: Insufficient documentation

## 2017-11-23 DIAGNOSIS — I10 Essential (primary) hypertension: Secondary | ICD-10-CM | POA: Insufficient documentation

## 2017-11-23 DIAGNOSIS — K222 Esophageal obstruction: Secondary | ICD-10-CM | POA: Insufficient documentation

## 2017-11-23 DIAGNOSIS — Z87891 Personal history of nicotine dependence: Secondary | ICD-10-CM | POA: Diagnosis not present

## 2017-11-23 DIAGNOSIS — K219 Gastro-esophageal reflux disease without esophagitis: Secondary | ICD-10-CM | POA: Insufficient documentation

## 2017-11-23 LAB — CBC
HEMATOCRIT: 38.3 % (ref 36.0–46.0)
HEMOGLOBIN: 12.2 g/dL (ref 12.0–15.0)
MCH: 28.9 pg (ref 26.0–34.0)
MCHC: 31.9 g/dL (ref 30.0–36.0)
MCV: 90.8 fL (ref 78.0–100.0)
Platelets: 351 10*3/uL (ref 150–400)
RBC: 4.22 MIL/uL (ref 3.87–5.11)
RDW: 14.6 % (ref 11.5–15.5)
WBC: 5.9 10*3/uL (ref 4.0–10.5)

## 2017-11-23 LAB — BASIC METABOLIC PANEL
ANION GAP: 8 (ref 5–15)
BUN: 27 mg/dL — ABNORMAL HIGH (ref 6–20)
CO2: 28 mmol/L (ref 22–32)
Calcium: 9.1 mg/dL (ref 8.9–10.3)
Chloride: 105 mmol/L (ref 101–111)
Creatinine, Ser: 1.34 mg/dL — ABNORMAL HIGH (ref 0.44–1.00)
GFR, EST AFRICAN AMERICAN: 49 mL/min — AB (ref >60)
GFR, EST NON AFRICAN AMERICAN: 42 mL/min — AB (ref >60)
GLUCOSE: 90 mg/dL (ref 65–99)
POTASSIUM: 4.4 mmol/L (ref 3.5–5.1)
Sodium: 141 mmol/L (ref 135–145)

## 2017-11-23 NOTE — Progress Notes (Signed)
PCP is Dr. Dennie Fetters Pulmonary is Dr Melvyn Novas cardiology is Dr Linward Headland chest pain, cough, or fever Echo noted 08-31-17 Stress test noted 10-07-17

## 2017-11-23 NOTE — Pre-Procedure Instructions (Signed)
[place]  11/23/2017      [place], [place] - [address] [place] [address] Phone: [phone] Fax: [phone]  [id number] - [place], [place] [address] [place] [address] Phone: [phone] Fax: [phone]    Your procedure is scheduled on June 18  Report to [facility] at 1000 A.M.  Call this number if you have problems the morning of surgery:  [phone]   Remember:  No food or Drink after midnight.     Take these medicines the morning of surgery with A SIP OF WATER  Amlodipine (Norvasc) Bupropion (Wellbutrin XL) Dulera inhaler Levothyroxine (Synthroid) Metoprolol tartrate (Lopressor) Omeprazole (Prilosec) Proventil inhaler  Bring your inhalers with you on the day of surgery  7 days prior to surgery stop taking Meloxicam (Mobic), aspirin, BC's, Goody's, Herbal medications, Fish Oil, Aleve, Ibuprofen, Advil, Motrin     Do not wear jewelry, make-up or nail polish.  Do not wear lotions, powders, or perfumes, or deodorant.  Do not shave 48 hours prior to surgery.  Men may shave face and neck.  Do not bring valuables to the hospital.  [facility] is not responsible for any belongings or valuables.  Contacts, dentures or bridgework may not be worn into surgery.  Leave your suitcase in the car.  After surgery it may be brought to your room.  For patients admitted to the hospital, discharge time will be determined by your treatment team.  Patients discharged the day of surgery will not be allowed to drive home.    Special instructions:  [facility]- Preparing For Surgery  Before surgery, you can play an important role. Because skin is not sterile, your skin needs to be as free of germs as possible. You can reduce the number of germs on your skin by washing with CHG (chlorahexidine  gluconate) Soap before surgery.  CHG is an antiseptic cleaner which kills germs and bonds with the skin to continue killing germs even after washing.    Oral Hygiene is also important to reduce your risk of infection.  Remember - BRUSH YOUR TEETH THE MORNING OF SURGERY WITH YOUR REGULAR TOOTHPASTE  Please do not use if you have an allergy to CHG or antibacterial soaps. If your skin becomes reddened/irritated stop using the CHG.  Do not shave (including legs and underarms) for at least 48 hours prior to first CHG shower. It is OK to shave your face.  Please follow these instructions carefully.   1. Shower the NIGHT BEFORE SURGERY and the MORNING OF SURGERY with CHG.   2. If you chose to wash your hair, wash your hair first as usual with your normal shampoo.  3. After you shampoo, rinse your hair and body thoroughly to remove the shampoo.  4. Use CHG as you would any other liquid soap. You can apply CHG directly to the skin and wash gently with a scrungie or a clean washcloth.   5. Apply the CHG Soap to your body ONLY FROM THE NECK DOWN.  Do not use on open wounds or open sores. Avoid contact with your eyes, ears, mouth and genitals (private parts). Wash Face and genitals (private parts)  with your normal soap.  6. Wash thoroughly, paying special attention to the area where your surgery will be performed.  7. Thoroughly rinse your body with warm water from the  neck down.  8. DO NOT shower/wash with your normal soap after using and rinsing off the CHG Soap.  9. Pat yourself dry with a CLEAN TOWEL.  10. Wear CLEAN PAJAMAS to bed the night before surgery, wear comfortable clothes the morning of surgery  11. Place CLEAN SHEETS on your bed the night of your first shower and DO NOT SLEEP WITH PETS.    Day of Surgery:  Do not apply any deodorants/lotions.  Please wear clean clothes to the hospital/surgery center.   Remember to brush your teeth WITH YOUR REGULAR TOOTHPASTE.    Please  read over the following fact sheets that you were given. Pain Booklet, Coughing and Deep Breathing and Surgical Site Infection Prevention

## 2017-11-24 NOTE — Progress Notes (Signed)
Anesthesia Chart Review:  Case:  [id number] Date/Time:  12/01/17 1200   Procedure:  ESOPHAGOSCOPY WITH DILITATION (N/A )   Anesthesia type:  General   Pre-op diagnosis:  HISTORY OF LARYNGEAL CANCER   Location:  [facility] ROOM 10 / [facility]   Surgeon:  [name], MD      DISCUSSION: Patient is a 62 year old female scheduled for the above procedure.  Surgery was initially scheduled for 08/26/17, but was postponed to allow her to complete dyspnea work-up.  History includes HTN, heart murmur, asthma, COPD, hypothyroidism, anemia, laryngeal cancer (s/p laryngectomy and radiation, tracheostomy dependent, s/p stomaplasty 10/21/16, TEP managed at [facility]), GERD.  Former smoker quit 2012).  BMI 34.0. - ED visit 08/13/17 for dislodged tracheoesophageal puncture (TEP). ENT Dr. [name] saw patient for TEP in the airway. She coughed it up and out of her airway.   Since March, she completed echo as ordered by her PCP. She also was referred to cardiology. According to 10/08/17 notation by [name], Deborah [name] (Cardiology), "Low risk stress test without ischemia. Cardiology follow up PRN. Given past medical history and time since last visit, based on ACC/AHA guidelines, Deborah [name] would be at acceptable risk for the planned procedure without further cardiovascular testing."  If no acute changes and I would anticipate that she could proceed as planned.  VS: BP (!) 155/78   Pulse 90   Temp 36.7 C   Resp 18   Ht 5\' 7"  (1.702 m)   Wt 217 lb 4.8 oz (98.6 kg)   SpO2 98%   BMI 34.03 kg/m   PROVIDERS: [name], MD is PCP. Last visit with [name], MD at the [name]. Patient Care Team: [name], MD as Attending Physician (Otolaryngology) Heath Lark, MD as Consulting Physician (Hematology and Oncology) Christinia Gully, MD is pulmonologist. Last visit 09/20/17. He felt patient's DOE was upper airway related, as no pulmonary findings to explain symptoms.  [name], MD is cardiologist. Seen by [name], PA 10/01/17 for dyspnea evaluation. PRN f/u recommended after testing (see below).  LABS: Labs reviewed: Acceptable for surgery. Creatinine is mildly elevated at 1.34 (previously 0.95-1.10 over the past year). (all labs ordered are listed, but only abnormal results are displayed)  Labs Reviewed  BASIC METABOLIC PANEL - Abnormal; Notable for the following components:      Result Value   BUN 27 (*)    Creatinine, Ser 1.34 (*)    GFR calc non Af Amer 42 (*)    GFR calc Af Amer 49 (*)    All other components within normal limits  CBC    IMAGES: CXR 08/21/17: IMPRESSION: No active cardiopulmonary disease.  OTHER: Walk Tests: 08/21/17   Walked RA x one lap @ 185 stopped due to  Sob with nl sats / rapid pace  09/18/17   Walked RA  2 laps @ 185 ft each stopped due to  Sob / no desats / slow pace   EKG: 10/01/17: NSR, possible LAE, LVH, T wave abnormality, consider lateral ischemia.   CV: Nuclear stress test 10/07/17:  Nuclear stress EF: 61%.  There was no ST segment deviation noted during stress.  Defect 1: There is a medium defect of moderate severity present in the mid anteroseptal and apex location.  The study is normal.  This is a low risk study.  The left ventricular ejection fraction is normal (55-65%).  Normal stress nuclear study with breast attenuation but no ischemia; EF  61 with normal wall motion.   Echo 08/31/17: Study Conclusions - Left ventricle: The cavity size was mildly reduced. Wall   thickness was increased in a pattern of moderate LVH. Systolic   function was vigorous. The estimated ejection fraction was in the   range of 65% to 70%. Wall motion was normal; there were no   regional wall motion abnormalities. Features are consistent with   a pseudonormal left ventricular filling pattern, with concomitant   abnormal relaxation and increased filling pressure (grade 2   diastolic dysfunction). -  Aortic valve: There was mild regurgitation. - Pulmonary arteries: Systolic pressure was mildly increased. PA   peak pressure: 35 mm Hg (S).   Past Medical History:  Diagnosis Date  . Anemia   . Arthritis    in knees  . Asthma   . Bronchitis   . COPD (chronic obstructive pulmonary disease) ([place])   . Diverticulosis 11/15/2012    noted on screening colonoscopy   . Dyspnea    with COPD exerbation  . Esophageal stricture   . Former smoker 03/19/2011  . GERD (gastroesophageal reflux disease)   . Heart murmur    asymptomatic   . History of laryngectomy   . 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 Hypertension   . 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 Internal hemorrhoid 11/15/2012    small, noted on screening colonoscopy   . Larynx cancer ([place]) 07/31/2010   supraglotttic s/p chemo/radiation and surgical rescection.  . Leukocytopenia   . Nausea alone 07/28/2013  . Neck pain 01/21/2012  . Normal MRI 07/14/11   negative for mestasis   . Pneumonia 2012  . Sciatica   . Seizures ([place])    07/24/11 off Effexor w/o seizure  . Sepsis ([place]) 08/04/12  . Sinusitis, chronic 07/20/2011  . Sinusitis, chronic 07/20/2011   Bilateral maxillary, identified on MRI of head 07/14/11.    . Tracheostomy dependent [facility])     Past Surgical History:  Procedure Laterality Date  . COLONOSCOPY N/A 11/15/2012   Procedure: COLONOSCOPY;  Surgeon: Lafayette Dragon, MD;  [facility];  Service: Endoscopy;  Laterality: N/A;  . DENTAL RESTORATION/EXTRACTION WITH X-RAY    . ESOPHAGOSCOPY  06/21/2012   Procedure: ESOPHAGOSCOPY;  Surgeon: [name], MD;  Location: [facility];  Service: ENT;  Laterality: N/A;  . ESOPHAGOSCOPY WITH DILITATION N/A 09/21/2014   Procedure: ESOPHAGOSCOPY WITH DILITATION;  Surgeon: [name], MD;  Location: [facility];  Service: ENT;  Laterality: N/A;  . ESOPHAGOSCOPY WITH DILITATION N/A 07/04/2016   Procedure: ESOPHAGOSCOPY WITH DILITATION;  Surgeon: [name], MD;  Location: [facility];  Service: ENT;  Laterality: N/A;  . FOREIGN BODY REMOVAL BRONCHIAL  10/02/2011   Procedure: REMOVAL FOREIGN BODY BRONCHIAL;  Surgeon: Ruby Cola, MD;  Location: [facility];  Service: ENT;  Laterality: N/A;  . LARYNGECTOMY    . Porta cath removal    . PORTACATH PLACEMENT  09/17/10   Tip in cavoatrial junction  . STOMAPLASTY N/A 10/21/2016   Procedure: Zola Button;  Surgeon: [name], MD;  Location: [facility];  Service: ENT;  Laterality: N/A;  . TRACHEAL DILITATION  07/16/2011   Procedure: TRACHEAL DILITATION;  Surgeon: Beckie Salts, MD;  Location: [facility];  Service: ENT;  Laterality: N/A;  dilation of tracheal stoma and replacement of stoma tube  . TUBAL LIGATION  1982    MEDICATIONS: . amLODipine (NORVASC) 10 MG tablet  . buPROPion (  WELLBUTRIN XL) 150 MG 24 hr tablet  . doxycycline (VIBRA-TABS) 100 MG tablet  . DULERA 200-5 MCG/ACT AERO  . furosemide (LASIX) 20 MG tablet  . levothyroxine (SYNTHROID, LEVOTHROID) 200 MCG tablet  . MELATONIN PO  . meloxicam (MOBIC) 7.5 MG tablet  . metoprolol tartrate (LOPRESSOR) 50 MG tablet  . nystatin cream (MYCOSTATIN)  . omeprazole (PRILOSEC) 40 MG capsule  . predniSONE (DELTASONE) 50 MG tablet  . PROVENTIL HFA 108 (90 Base) MCG/ACT inhaler  . Spacer/Aero Chamber [employer]  . TRANSDERM-SCOP, 1.5 MG, 1 MG/3DAYS   No current facility-administered medications for this encounter.   She was on prednisone 50 mg X 5 days and doxycycline for 7 days for mild COPD exacerbation 11/13/17.   Deborah [name] Short Stay [name]/Anesthesiology Phone [phone] 11/24/2017 5:33 PM

## 2017-12-01 ENCOUNTER — Encounter (HOSPITAL_COMMUNITY): Payer: Self-pay | Admitting: Surgery

## 2017-12-01 ENCOUNTER — Encounter (HOSPITAL_COMMUNITY)
Admission: RE | Disposition: A | Discharge status: Home / Self Care | Payer: Self-pay | Source: Ambulatory Visit | Attending: Otolaryngology

## 2017-12-01 ENCOUNTER — Ambulatory Visit (HOSPITAL_COMMUNITY): Payer: Medicaid Other

## 2017-12-01 ENCOUNTER — Other Ambulatory Visit: Payer: Self-pay

## 2017-12-01 ENCOUNTER — Ambulatory Visit (HOSPITAL_COMMUNITY)
Admission: RE | Admit: 2017-12-01 | Discharge: 2017-12-01 | Disposition: A | Discharge status: Home / Self Care | Payer: Medicaid Other | Source: Ambulatory Visit | Attending: Otolaryngology | Admitting: Otolaryngology

## 2017-12-01 ENCOUNTER — Ambulatory Visit (HOSPITAL_COMMUNITY): Payer: Medicaid Other | Admitting: Emergency Medicine

## 2017-12-01 DIAGNOSIS — R011 Cardiac murmur, unspecified: Secondary | ICD-10-CM | POA: Insufficient documentation

## 2017-12-01 DIAGNOSIS — Z8719 Personal history of other diseases of the digestive system: Secondary | ICD-10-CM | POA: Insufficient documentation

## 2017-12-01 DIAGNOSIS — Z8249 Family history of ischemic heart disease and other diseases of the circulatory system: Secondary | ICD-10-CM | POA: Insufficient documentation

## 2017-12-01 DIAGNOSIS — M1990 Unspecified osteoarthritis, unspecified site: Secondary | ICD-10-CM | POA: Insufficient documentation

## 2017-12-01 DIAGNOSIS — Z888 Allergy status to other drugs, medicaments and biological substances status: Secondary | ICD-10-CM | POA: Diagnosis not present

## 2017-12-01 DIAGNOSIS — Z9002 Acquired absence of larynx: Secondary | ICD-10-CM | POA: Diagnosis not present

## 2017-12-01 DIAGNOSIS — M170 Bilateral primary osteoarthritis of knee: Secondary | ICD-10-CM | POA: Diagnosis not present

## 2017-12-01 DIAGNOSIS — E890 Postprocedural hypothyroidism: Secondary | ICD-10-CM | POA: Insufficient documentation

## 2017-12-01 DIAGNOSIS — F419 Anxiety disorder, unspecified: Secondary | ICD-10-CM | POA: Insufficient documentation

## 2017-12-01 DIAGNOSIS — W881XXA Exposure to radioactive isotopes, initial encounter: Secondary | ICD-10-CM | POA: Insufficient documentation

## 2017-12-01 DIAGNOSIS — J449 Chronic obstructive pulmonary disease, unspecified: Secondary | ICD-10-CM | POA: Insufficient documentation

## 2017-12-01 DIAGNOSIS — Z87891 Personal history of nicotine dependence: Secondary | ICD-10-CM | POA: Diagnosis not present

## 2017-12-01 DIAGNOSIS — K222 Esophageal obstruction: Secondary | ICD-10-CM | POA: Diagnosis not present

## 2017-12-01 DIAGNOSIS — I10 Essential (primary) hypertension: Secondary | ICD-10-CM | POA: Insufficient documentation

## 2017-12-01 DIAGNOSIS — K219 Gastro-esophageal reflux disease without esophagitis: Secondary | ICD-10-CM | POA: Insufficient documentation

## 2017-12-01 HISTORY — PX: ESOPHAGOSCOPY WITH DILITATION: SHX5618

## 2017-12-01 SURGERY — ESOPHAGOSCOPY, WITH DILATION
Anesthesia: General | Site: Mouth

## 2017-12-01 MED ORDER — FENTANYL CITRATE (PF) 250 MCG/5ML IJ SOLN
INTRAMUSCULAR | Status: AC
  Filled 2017-12-01: qty 5

## 2017-12-01 MED ORDER — PROPOFOL 10 MG/ML IV BOLUS
INTRAVENOUS | Status: AC
  Filled 2017-12-01: qty 20

## 2017-12-01 MED ORDER — EPHEDRINE SULFATE 50 MG/ML IJ SOLN
INTRAMUSCULAR | Status: DC | PRN
  Administered 2017-12-01 (×2): 10 mg via INTRAVENOUS
  Administered 2017-12-01: 5 mg via INTRAVENOUS

## 2017-12-01 MED ORDER — ONDANSETRON HCL 4 MG/2ML IJ SOLN
INTRAMUSCULAR | Status: DC | PRN
  Administered 2017-12-01: 4 mg via INTRAVENOUS

## 2017-12-01 MED ORDER — OXYCODONE HCL 5 MG PO TABS: 5.0000 mg | ORAL_TABLET | Freq: Once | ORAL | Status: DC | PRN

## 2017-12-01 MED ORDER — DEXAMETHASONE SODIUM PHOSPHATE 10 MG/ML IJ SOLN
INTRAMUSCULAR | Status: DC | PRN
  Administered 2017-12-01: 10 mg via INTRAVENOUS

## 2017-12-01 MED ORDER — FENTANYL CITRATE (PF) 100 MCG/2ML IJ SOLN
INTRAMUSCULAR | Status: DC | PRN
  Administered 2017-12-01: 50 ug via INTRAVENOUS
  Administered 2017-12-01: 25 ug via INTRAVENOUS

## 2017-12-01 MED ORDER — SODIUM CHLORIDE 0.9 % IJ SOLN
INTRAMUSCULAR | Status: AC
  Filled 2017-12-01: qty 10

## 2017-12-01 MED ORDER — LACTATED RINGERS IV SOLN: INTRAVENOUS | Status: DC

## 2017-12-01 MED ORDER — 0.9 % SODIUM CHLORIDE (POUR BTL) OPTIME
TOPICAL | Status: DC | PRN
  Administered 2017-12-01: 1000 mL

## 2017-12-01 MED ORDER — PROMETHAZINE HCL 25 MG/ML IJ SOLN: 6.2500 mg | INTRAMUSCULAR | Status: DC | PRN

## 2017-12-01 MED ORDER — ONDANSETRON HCL 4 MG/2ML IJ SOLN
INTRAMUSCULAR | Status: AC
  Filled 2017-12-01: qty 2

## 2017-12-01 MED ORDER — OXYCODONE HCL 5 MG/5ML PO SOLN: 5.0000 mg | Freq: Once | ORAL | Status: DC | PRN

## 2017-12-01 MED ORDER — EPHEDRINE SULFATE 50 MG/ML IJ SOLN
INTRAMUSCULAR | Status: AC
  Filled 2017-12-01: qty 1

## 2017-12-01 MED ORDER — HYDROMORPHONE HCL 2 MG/ML IJ SOLN: 0.2500 mg | INTRAMUSCULAR | Status: DC | PRN

## 2017-12-01 MED ORDER — PROPOFOL 10 MG/ML IV BOLUS
INTRAVENOUS | Status: DC | PRN
  Administered 2017-12-01: 200 mg via INTRAVENOUS

## 2017-12-01 MED ORDER — DEXAMETHASONE SODIUM PHOSPHATE 10 MG/ML IJ SOLN
INTRAMUSCULAR | Status: AC
  Filled 2017-12-01: qty 1

## 2017-12-01 MED ORDER — MIDAZOLAM HCL 2 MG/2ML IJ SOLN
INTRAMUSCULAR | Status: AC
  Filled 2017-12-01: qty 2

## 2017-12-01 MED ORDER — LACTATED RINGERS IV SOLN
INTRAVENOUS | Status: DC | PRN
  Administered 2017-12-01: 11:00:00 via INTRAVENOUS

## 2017-12-01 MED ORDER — MIDAZOLAM HCL 5 MG/5ML IJ SOLN
INTRAMUSCULAR | Status: DC | PRN
  Administered 2017-12-01: 2 mg via INTRAVENOUS

## 2017-12-01 MED ORDER — LIDOCAINE HCL (CARDIAC) PF 100 MG/5ML IV SOSY
PREFILLED_SYRINGE | INTRAVENOUS | Status: DC | PRN
  Administered 2017-12-01: 75 mg via INTRAVENOUS

## 2017-12-01 MED ORDER — LIDOCAINE 2% (20 MG/ML) 5 ML SYRINGE
INTRAMUSCULAR | Status: AC
  Filled 2017-12-01: qty 5

## 2017-12-01 SURGICAL SUPPLY — 24 items
BALLN PULM 15 16.5 18X75 (BALLOONS) ×2
BALLOON PULM 15 16.5 18X75 (BALLOONS) ×1 IMPLANT
BLADE SURG 15 STRL LF DISP TIS (BLADE) IMPLANT
BLADE SURG 15 STRL SS (BLADE)
CANISTER SUCT 3000ML PPV (MISCELLANEOUS) ×2 IMPLANT
CONT SPEC 4OZ CLIKSEAL STRL BL (MISCELLANEOUS) IMPLANT
COVER BACK TABLE 60X90IN (DRAPES) ×2 IMPLANT
CRADLE DONUT ADULT HEAD (MISCELLANEOUS) IMPLANT
DRAPE HALF SHEET 40X57 (DRAPES) ×2 IMPLANT
GAUZE SPONGE 4X4 12PLY STRL (GAUZE/BANDAGES/DRESSINGS) IMPLANT
GLOVE BIO SURGEON STRL SZ7.5 (GLOVE) ×2 IMPLANT
GUARD TEETH (MISCELLANEOUS) IMPLANT
KIT BASIN OR (CUSTOM PROCEDURE TRAY) ×2 IMPLANT
KIT TURNOVER KIT B (KITS) ×2 IMPLANT
NEEDLE PRECISIONGLIDE 27X1.5 (NEEDLE) IMPLANT
NS IRRIG 1000ML POUR BTL (IV SOLUTION) ×2 IMPLANT
PAD ARMBOARD 7.5X6 YLW CONV (MISCELLANEOUS) ×4 IMPLANT
PATTIES SURGICAL .5 X3 (DISPOSABLE) IMPLANT
SOLUTION ANTI FOG 6CC (MISCELLANEOUS) IMPLANT
SURGILUBE 2OZ TUBE FLIPTOP (MISCELLANEOUS) ×2 IMPLANT
SYR GAUGE ALLIANCE SINGLE USE (MISCELLANEOUS) ×2 IMPLANT
TOWEL OR 17X24 6PK STRL BLUE (TOWEL DISPOSABLE) ×4 IMPLANT
TUBE CONNECTING 12X1/4 (SUCTIONS) ×2 IMPLANT
WATER STERILE IRR 1000ML POUR (IV SOLUTION) ×2 IMPLANT

## 2017-12-01 NOTE — Interval H&P Note (Signed)
History and Physical Interval Note:  12/01/2017 11:51 AM  Deborah [name]  has presented today for surgery, with the diagnosis of HISTORY OF LARYNGEAL CANCER  The various methods of treatment have been discussed with the patient and family. After consideration of risks, benefits and other options for treatment, the patient has consented to  Procedure(s): ESOPHAGOSCOPY WITH DILITATION (N/A) as a surgical intervention .  The patient's history has been reviewed, patient examined, no change in status, stable for surgery.  I have reviewed the patient's chart and labs.  Questions were answered to the patient's satisfaction.     [name]

## 2017-12-01 NOTE — Discharge Instructions (Signed)
Start on liquids today.  Advance diet as you are comfortable.  Follow-up if you have any additional problems.

## 2017-12-01 NOTE — Transfer of Care (Signed)
Immediate Anesthesia Transfer of Care Note  Patient: Deborah [name]  Procedure(s) Performed: ESOPHAGOSCOPY WITH DILITATION (N/A Mouth)  Patient Location: PACU  Anesthesia Type:General  Level of Consciousness: awake, alert  and oriented  Airway & Oxygen Therapy: Patient Spontanous Breathing and Patient connected to T-piece oxygen  Post-op Assessment: Report given to RN, Post -op Vital signs reviewed and stable and Patient moving all extremities X 4  Post vital signs: Reviewed and stable  Last Vitals:  Vitals Value Taken Time  BP 117/67 12/01/2017  1:04 PM  Temp    Pulse 66 12/01/2017  1:05 PM  Resp 16 12/01/2017  1:05 PM  SpO2 100 % 12/01/2017  1:05 PM  Vitals shown include unvalidated device data.  Last Pain:  Vitals:   12/01/17 1029  TempSrc: Oral  PainSc:       Patients Stated Pain Goal: 1 (64/33/29 5188)  Complications: No apparent anesthesia complications

## 2017-12-01 NOTE — Anesthesia Procedure Notes (Signed)
Procedure Name: Intubation Date/Time: 12/01/2017 12:24 PM Performed by: Neldon Newport, CRNA Pre-anesthesia Checklist: Patient identified, Emergency Drugs available, Suction available and Patient being monitored Patient Re-evaluated:Patient Re-evaluated prior to induction Oxygen Delivery Method: Circle system utilized Preoxygenation: Pre-oxygenation with 100% oxygen Induction Type: IV induction Tube type: Parker flex tip (ETT placed in tracheostomy) Tube size: 5.5 mm Number of attempts: 1 Dental Injury: Teeth and Oropharynx as per pre-operative assessment

## 2017-12-01 NOTE — Op Note (Signed)
OPERATIVE REPORT  DATE OF SURGERY: 12/01/2017  PATIENT:  Godfrey Pick,  62 y.o. female  PRE-OPERATIVE DIAGNOSIS:  HISTORY OF LARYNGEAL CANCER  POST-OPERATIVE DIAGNOSIS:  History of Laryngeal cancer  PROCEDURE:  Procedure(s): ESOPHAGOSCOPY WITH DILITATION  SURGEON:  Beckie Salts, MD  ASSISTANTS: None  ANESTHESIA:   General   EBL: 0 ml  DRAINS: None  LOCAL MEDICATIONS USED:  None  SPECIMEN:  none  COUNTS:  Correct  PROCEDURE DETAILS: The patient was taken to the operating room and placed on the operating table in the supine position. Following induction of general endotracheal anesthesia, the patient was draped in a standard fashion.  A rigid cervical esophagoscope was used to inspect the nasopharynx.  There are no mucosal lesions identified.  I was unable to pass the scope into the esophagus.  Bougie dilators were used, starting at the smallest size 20 [place], and serially dilating up until 45 [place].  For the larger dilators on a replaced the esophagoscope with a Jackson sliding laryngoscope and use the suspension apparatus.  There is no bleeding, no obvious mucosal injury.  Esophagoscopy was performed again at the end and there is no evidence of injury.  Patient was awakened extubated and transferred to recovery in stable condition.    PATIENT DISPOSITION:  To PACU, stable

## 2017-12-01 NOTE — Anesthesia Preprocedure Evaluation (Addendum)
Anesthesia Evaluation  Patient identified by MRN, date of birth, ID band Patient awake    Reviewed: Allergy & Precautions, H&P , NPO status , Patient's Chart, lab work & pertinent test results  Airway Mallampati: III  TM Distance: >3 FB Neck ROM: Full    Dental no notable dental hx. (+) Edentulous Upper, Edentulous Lower, Dental Advisory Given   Pulmonary asthma , COPD,  COPD inhaler, former smoker,    Pulmonary exam normal breath sounds clear to auscultation       Cardiovascular hypertension, Pt. on medications and Pt. on home beta blockers + DOE  + Valvular Problems/Murmurs AI  Rhythm:Regular Rate:Normal     Neuro/Psych Seizures -, Well Controlled,  Anxiety negative psych ROS   GI/Hepatic Neg liver ROS, GERD  Medicated and Controlled,  Endo/Other  Hypothyroidism   Renal/GU negative Renal ROS  negative genitourinary   Musculoskeletal  (+) Arthritis , Osteoarthritis,    Abdominal   Peds  Hematology negative hematology ROS (+) anemia ,   Anesthesia Other Findings   Reproductive/Obstetrics negative OB ROS                            Anesthesia Physical  Anesthesia Plan  ASA: III  Anesthesia Plan: General   Post-op Pain Management:    Induction: Intravenous  PONV Risk Score and Plan: 3 and Ondansetron, Dexamethasone and Midazolam  Airway Management Planned: Tracheostomy  Additional Equipment:   Intra-op Plan:   Post-operative Plan: Extubation in OR  Informed Consent: I have reviewed the patients History and Physical, chart, labs and discussed the procedure including the risks, benefits and alternatives for the proposed anesthesia with the patient or authorized representative who has indicated his/her understanding and acceptance.   Dental advisory given  Plan Discussed with: CRNA  Anesthesia Plan Comments:         Anesthesia Quick Evaluation

## 2017-12-02 ENCOUNTER — Encounter (HOSPITAL_COMMUNITY): Payer: Self-pay | Admitting: Otolaryngology

## 2017-12-02 NOTE — Anesthesia Postprocedure Evaluation (Signed)
Anesthesia Post Note  Patient: Kristine Royal Blankenbaker  Procedure(s) Performed: ESOPHAGOSCOPY WITH DILITATION (N/A Mouth)     Patient location during evaluation: PACU Anesthesia Type: General Level of consciousness: awake and alert Pain management: pain level controlled Vital Signs Assessment: post-procedure vital signs reviewed and stable Respiratory status: spontaneous breathing, nonlabored ventilation and respiratory function stable Cardiovascular status: blood pressure returned to baseline and stable Postop Assessment: no apparent nausea or vomiting Anesthetic complications: no    Last Vitals:  Vitals:   12/01/17 1350 12/01/17 1402  BP: 114/73 111/73  Pulse: 68 65  Resp: 19 14  Temp: (!) 36.1 C   SpO2: 97% 94%    Last Pain:  Vitals:   12/01/17 1402  TempSrc:   PainSc: 0-No pain                 Lynda Rainwater

## 2017-12-07 ENCOUNTER — Other Ambulatory Visit: Payer: Self-pay | Admitting: Internal Medicine

## 2017-12-07 DIAGNOSIS — I10 Essential (primary) hypertension: Secondary | ICD-10-CM

## 2017-12-11 ENCOUNTER — Other Ambulatory Visit: Payer: Self-pay | Admitting: Internal Medicine

## 2017-12-11 NOTE — Telephone Encounter (Signed)
Patient is no longer on Losartan.

## 2018-01-05 ENCOUNTER — Other Ambulatory Visit: Payer: Self-pay | Admitting: Family Medicine

## 2018-01-05 ENCOUNTER — Other Ambulatory Visit: Payer: Self-pay | Admitting: Internal Medicine

## 2018-01-05 DIAGNOSIS — I10 Essential (primary) hypertension: Secondary | ICD-10-CM

## 2018-01-06 DIAGNOSIS — I10 Essential (primary) hypertension: Secondary | ICD-10-CM | POA: Diagnosis not present

## 2018-01-06 DIAGNOSIS — J9503 Malfunction of tracheostomy stoma: Secondary | ICD-10-CM | POA: Diagnosis not present

## 2018-01-06 DIAGNOSIS — C329 Malignant neoplasm of larynx, unspecified: Secondary | ICD-10-CM | POA: Diagnosis not present

## 2018-01-06 DIAGNOSIS — Z9002 Acquired absence of larynx: Secondary | ICD-10-CM | POA: Diagnosis not present

## 2018-01-08 ENCOUNTER — Emergency Department (HOSPITAL_COMMUNITY): Payer: Medicaid Other | Admitting: Certified Registered Nurse Anesthetist

## 2018-01-08 ENCOUNTER — Emergency Department (HOSPITAL_COMMUNITY)
Admission: EM | Admit: 2018-01-08 | Discharge: 2018-01-08 | Disposition: A | Discharge status: Home / Self Care | Payer: Medicaid Other | Attending: Emergency Medicine | Admitting: Emergency Medicine

## 2018-01-08 ENCOUNTER — Encounter (HOSPITAL_COMMUNITY): Payer: Self-pay

## 2018-01-08 ENCOUNTER — Other Ambulatory Visit: Payer: Self-pay

## 2018-01-08 ENCOUNTER — Encounter (HOSPITAL_COMMUNITY)
Admission: EM | Disposition: A | Discharge status: Home / Self Care | Payer: Self-pay | Source: Home / Self Care | Attending: Emergency Medicine

## 2018-01-08 ENCOUNTER — Emergency Department (HOSPITAL_COMMUNITY): Payer: Medicaid Other

## 2018-01-08 DIAGNOSIS — Z79899 Other long term (current) drug therapy: Secondary | ICD-10-CM | POA: Diagnosis not present

## 2018-01-08 DIAGNOSIS — Z87891 Personal history of nicotine dependence: Secondary | ICD-10-CM | POA: Diagnosis not present

## 2018-01-08 DIAGNOSIS — T17500A Unspecified foreign body in bronchus causing asphyxiation, initial encounter: Secondary | ICD-10-CM | POA: Diagnosis not present

## 2018-01-08 DIAGNOSIS — F419 Anxiety disorder, unspecified: Secondary | ICD-10-CM | POA: Diagnosis not present

## 2018-01-08 DIAGNOSIS — X58XXXA Exposure to other specified factors, initial encounter: Secondary | ICD-10-CM | POA: Diagnosis not present

## 2018-01-08 DIAGNOSIS — T17508A Unspecified foreign body in bronchus causing other injury, initial encounter: Secondary | ICD-10-CM | POA: Diagnosis not present

## 2018-01-08 DIAGNOSIS — Z963 Presence of artificial larynx: Secondary | ICD-10-CM | POA: Diagnosis not present

## 2018-01-08 DIAGNOSIS — I10 Essential (primary) hypertension: Secondary | ICD-10-CM | POA: Diagnosis not present

## 2018-01-08 DIAGNOSIS — E890 Postprocedural hypothyroidism: Secondary | ICD-10-CM | POA: Diagnosis not present

## 2018-01-08 DIAGNOSIS — Z923 Personal history of irradiation: Secondary | ICD-10-CM | POA: Diagnosis not present

## 2018-01-08 DIAGNOSIS — Z430 Encounter for attention to tracheostomy: Secondary | ICD-10-CM | POA: Diagnosis not present

## 2018-01-08 DIAGNOSIS — Z930 Tracheostomy status: Secondary | ICD-10-CM | POA: Insufficient documentation

## 2018-01-08 DIAGNOSIS — F329 Major depressive disorder, single episode, unspecified: Secondary | ICD-10-CM | POA: Insufficient documentation

## 2018-01-08 DIAGNOSIS — E669 Obesity, unspecified: Secondary | ICD-10-CM | POA: Insufficient documentation

## 2018-01-08 DIAGNOSIS — R0602 Shortness of breath: Secondary | ICD-10-CM | POA: Diagnosis not present

## 2018-01-08 DIAGNOSIS — Z6833 Body mass index (BMI) 33.0-33.9, adult: Secondary | ICD-10-CM | POA: Insufficient documentation

## 2018-01-08 DIAGNOSIS — Y842 Radiological procedure and radiotherapy as the cause of abnormal reaction of the patient, or of later complication, without mention of misadventure at the time of the procedure: Secondary | ICD-10-CM | POA: Insufficient documentation

## 2018-01-08 DIAGNOSIS — R0600 Dyspnea, unspecified: Secondary | ICD-10-CM | POA: Diagnosis not present

## 2018-01-08 DIAGNOSIS — T17590A Other foreign object in bronchus causing asphyxiation, initial encounter: Secondary | ICD-10-CM | POA: Insufficient documentation

## 2018-01-08 DIAGNOSIS — J449 Chronic obstructive pulmonary disease, unspecified: Secondary | ICD-10-CM | POA: Insufficient documentation

## 2018-01-08 DIAGNOSIS — Z9002 Acquired absence of larynx: Secondary | ICD-10-CM | POA: Diagnosis not present

## 2018-01-08 DIAGNOSIS — Z79890 Hormone replacement therapy (postmenopausal): Secondary | ICD-10-CM | POA: Diagnosis not present

## 2018-01-08 DIAGNOSIS — T17908A Unspecified foreign body in respiratory tract, part unspecified causing other injury, initial encounter: Secondary | ICD-10-CM

## 2018-01-08 DIAGNOSIS — R0603 Acute respiratory distress: Secondary | ICD-10-CM | POA: Diagnosis not present

## 2018-01-08 DIAGNOSIS — R0682 Tachypnea, not elsewhere classified: Secondary | ICD-10-CM | POA: Diagnosis not present

## 2018-01-08 HISTORY — PX: FOREIGN BODY REMOVAL BRONCHIAL: SHX5320

## 2018-01-08 HISTORY — PX: FLEXIBLE BRONCHOSCOPY: SUR164

## 2018-01-08 SURGERY — REMOVAL, FOREIGN BODY, BRONCHUS
Anesthesia: General | Site: Bronchus

## 2018-01-08 MED ORDER — MIDAZOLAM HCL 5 MG/5ML IJ SOLN
INTRAMUSCULAR | Status: DC | PRN
  Administered 2018-01-08: 1 mg via INTRAVENOUS

## 2018-01-08 MED ORDER — DEXMEDETOMIDINE HCL 400 MCG/4ML IV SOLN
98.4000 ug | Freq: Once | INTRAVENOUS | Status: DC
  Filled 2018-01-08: qty 4

## 2018-01-08 MED ORDER — ROCURONIUM BROMIDE 10 MG/ML (PF) SYRINGE
PREFILLED_SYRINGE | INTRAVENOUS | Status: DC | PRN
  Administered 2018-01-08: 50 mg via INTRAVENOUS

## 2018-01-08 MED ORDER — LIDOCAINE HCL 2 % IJ SOLN
10.0000 mL | Freq: Once | INTRAMUSCULAR | Status: AC
  Administered 2018-01-08: 200 mg

## 2018-01-08 MED ORDER — SUCCINYLCHOLINE CHLORIDE 200 MG/10ML IV SOSY
PREFILLED_SYRINGE | INTRAVENOUS | Status: DC | PRN
  Administered 2018-01-08: 120 mg via INTRAVENOUS

## 2018-01-08 MED ORDER — MIDAZOLAM HCL 2 MG/2ML IJ SOLN
INTRAMUSCULAR | Status: AC
  Filled 2018-01-08: qty 2

## 2018-01-08 MED ORDER — LORAZEPAM 2 MG/ML IJ SOLN
INTRAMUSCULAR | Status: AC
  Administered 2018-01-08: 1 mg via INTRAVENOUS
  Filled 2018-01-08: qty 1

## 2018-01-08 MED ORDER — DIPHENHYDRAMINE HCL 25 MG PO TABS: 25.0000 mg | ORAL_TABLET | Freq: Four times a day (QID) | ORAL | Status: DC

## 2018-01-08 MED ORDER — FUROSEMIDE 20 MG PO TABS: 20.0000 mg | ORAL_TABLET | Freq: Every day | ORAL | Status: DC | PRN

## 2018-01-08 MED ORDER — LIDOCAINE HCL (PF) 4 % IJ SOLN
5.0000 mL | Freq: Once | INTRAMUSCULAR | Status: AC
  Administered 2018-01-08: 5 mL via INTRADERMAL
  Filled 2018-01-08: qty 5

## 2018-01-08 MED ORDER — KETAMINE HCL 50 MG/5ML IJ SOSY
1.0000 mg/kg | PREFILLED_SYRINGE | Freq: Once | INTRAMUSCULAR | Status: AC
  Administered 2018-01-08: 100 mg via INTRAVENOUS
  Filled 2018-01-08: qty 10

## 2018-01-08 MED ORDER — LIDOCAINE HCL (PF) 4 % IJ SOLN
5.0000 mL | Freq: Once | INTRAMUSCULAR | Status: AC
  Administered 2018-01-08: 5 mL via INTRADERMAL

## 2018-01-08 MED ORDER — LIDOCAINE HCL URETHRAL/MUCOSAL 2 % EX GEL
1.0000 "application " | Freq: Once | CUTANEOUS | Status: AC
  Administered 2018-01-08: 1 via INTRATRACHEAL
  Filled 2018-01-08: qty 5

## 2018-01-08 MED ORDER — LORAZEPAM 2 MG/ML IJ SOLN
1.0000 mg | Freq: Once | INTRAMUSCULAR | Status: AC
  Administered 2018-01-08: 1 mg via INTRAVENOUS

## 2018-01-08 MED ORDER — LIDOCAINE HCL (PF) 4 % IJ SOLN
4.0000 mL | Freq: Once | INTRAMUSCULAR | Status: DC
  Filled 2018-01-08: qty 5

## 2018-01-08 MED ORDER — BUPROPION HCL ER (XL) 150 MG PO TB24: 150.0000 mg | ORAL_TABLET | Freq: Every day | ORAL | Status: DC

## 2018-01-08 MED ORDER — PHENYLEPHRINE 40 MCG/ML (10ML) SYRINGE FOR IV PUSH (FOR BLOOD PRESSURE SUPPORT)
PREFILLED_SYRINGE | INTRAVENOUS | Status: DC | PRN
  Administered 2018-01-08 (×2): 160 ug via INTRAVENOUS
  Administered 2018-01-08 (×2): 80 ug via INTRAVENOUS

## 2018-01-08 MED ORDER — LEVOTHYROXINE SODIUM 200 MCG PO TABS: 200.0000 ug | ORAL_TABLET | Freq: Every day | ORAL | Status: DC

## 2018-01-08 MED ORDER — LIDOCAINE 2% (20 MG/ML) 5 ML SYRINGE
INTRAMUSCULAR | Status: DC | PRN
  Administered 2018-01-08: 60 mg via INTRAVENOUS

## 2018-01-08 MED ORDER — MOMETASONE FURO-FORMOTEROL FUM 200-5 MCG/ACT IN AERO: 2.0000 | INHALATION_SPRAY | Freq: Two times a day (BID) | RESPIRATORY_TRACT | Status: DC

## 2018-01-08 MED ORDER — PHENYLEPHRINE 40 MCG/ML (10ML) SYRINGE FOR IV PUSH (FOR BLOOD PRESSURE SUPPORT)
PREFILLED_SYRINGE | INTRAVENOUS | Status: AC
  Filled 2018-01-08: qty 20

## 2018-01-08 MED ORDER — LACTATED RINGERS IV SOLN
INTRAVENOUS | Status: DC | PRN
  Administered 2018-01-08: 10:00:00 via INTRAVENOUS

## 2018-01-08 MED ORDER — SCOPOLAMINE 1 MG/3DAYS TD PT72: 1.0000 | MEDICATED_PATCH | TRANSDERMAL | Status: DC

## 2018-01-08 MED ORDER — ALBUTEROL SULFATE HFA 108 (90 BASE) MCG/ACT IN AERS: 2.0000 | INHALATION_SPRAY | RESPIRATORY_TRACT | Status: DC

## 2018-01-08 MED ORDER — METOPROLOL TARTRATE 50 MG PO TABS: 50.0000 mg | ORAL_TABLET | Freq: Two times a day (BID) | ORAL | Status: DC

## 2018-01-08 MED ORDER — MELATONIN 3 MG PO TABS: 3.0000 mg | ORAL_TABLET | Freq: Every evening | ORAL | Status: DC | PRN

## 2018-01-08 MED ORDER — LIDOCAINE HCL (PF) 4 % IJ SOLN: 5.0000 mL | Freq: Once | INTRAMUSCULAR | Status: DC

## 2018-01-08 MED ORDER — AMLODIPINE BESYLATE 10 MG PO TABS: 10.0000 mg | ORAL_TABLET | Freq: Every day | ORAL | Status: DC

## 2018-01-08 MED ORDER — DEXMEDETOMIDINE HCL 400 MCG/4ML IV SOLN
1.0000 ug/kg | Freq: Once | INTRAVENOUS | Status: DC
  Filled 2018-01-08: qty 4

## 2018-01-08 MED ORDER — MELOXICAM 7.5 MG PO TABS: 7.5000 mg | ORAL_TABLET | Freq: Every day | ORAL | Status: DC

## 2018-01-08 MED ORDER — PROPOFOL 10 MG/ML IV BOLUS
INTRAVENOUS | Status: DC | PRN
  Administered 2018-01-08: 110 mg via INTRAVENOUS

## 2018-01-08 MED ORDER — PHENYLEPHRINE HCL 10 MG/ML IJ SOLN
INTRAMUSCULAR | Status: AC
  Filled 2018-01-08: qty 2

## 2018-01-08 MED ORDER — SUGAMMADEX SODIUM 200 MG/2ML IV SOLN
INTRAVENOUS | Status: DC | PRN
  Administered 2018-01-08: 400 mg via INTRAVENOUS

## 2018-01-08 SURGICAL SUPPLY — 2 items
GLOVE ECLIPSE 8.0 STRL XLNG CF (GLOVE) ×2 IMPLANT
TUBING CONNECTING 10 (TUBING) ×2 IMPLANT

## 2018-01-08 NOTE — ED Notes (Signed)
Called Pharmacy for another vial of Lidocaine HCL 4% INJ (31ml).

## 2018-01-08 NOTE — Consult Note (Signed)
Deborah [name], Deborah [name] 62 y.o., female [id number]     Chief Complaint: dyspnea  HPI: 62 year old black female, status post total laryngectomy and radiation at [facility] has used a tracheoesophageal prosthesis for speech. It became dislodged and was replaced at [facility] yesterday. Became dislodged again this morning and she aspirated it. She has been having difficulty breathing and coughing since.   PMH: Past Medical History:  Diagnosis Date  . Anemia   . Arthritis    in knees  . Asthma   . Bronchitis   . COPD (chronic obstructive pulmonary disease) ([place])   . Diverticulosis 11/15/2012    noted on screening colonoscopy   . Dyspnea    with COPD exerbation  . Esophageal stricture   . Former smoker 03/19/2011  . GERD (gastroesophageal reflux disease)   . Heart murmur    asymptomatic   . History of laryngectomy   . 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 Hypertension   . 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 Internal hemorrhoid 11/15/2012    small, noted on screening colonoscopy   . 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  . Leukocytopenia   . Nausea alone 07/28/2013  . Neck pain 01/21/2012  . Normal MRI 07/14/11   negative for mestasis   . Pneumonia 2012  . Sciatica   . Seizures ([place])    07/24/11 off Effexor w/o seizure  . Sepsis ([place]) 08/04/12  . Sinusitis, chronic 07/20/2011  . Sinusitis, chronic 07/20/2011   Bilateral maxillary, identified on MRI of head 07/14/11.    . 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[place])     Surg Hx: Past Surgical History:  Procedure Laterality Date  . COLONOSCOPY N/A 11/15/2012   Procedure: COLONOSCOPY;  Surgeon: Lafayette Dragon, MD;  Location: [facility];  Service: Endoscopy;  Laterality: N/A;  . DENTAL RESTORATION/EXTRACTION WITH X-RAY    . ESOPHAGOSCOPY  06/21/2012   Procedure: ESOPHAGOSCOPY;  Surgeon: [name], MD;  Location: [place];  Service: ENT;   Laterality: N/A;  . ESOPHAGOSCOPY WITH DILITATION N/A 09/21/2014   Procedure: ESOPHAGOSCOPY WITH DILITATION;  Surgeon: [name], MD;  Location: [place];  Service: ENT;  Laterality: N/A;  . ESOPHAGOSCOPY WITH DILITATION N/A 07/04/2016   Procedure: ESOPHAGOSCOPY WITH DILITATION;  Surgeon: [name], MD;  Location: [place];  Service: ENT;  Laterality: N/A;  . ESOPHAGOSCOPY WITH DILITATION N/A 12/01/2017   Procedure: ESOPHAGOSCOPY WITH DILITATION;  Surgeon: [name], MD;  Location: [place];  Service: ENT;  Laterality: N/A;  . FOREIGN BODY REMOVAL BRONCHIAL  10/02/2011   Procedure: REMOVAL FOREIGN BODY BRONCHIAL;  Surgeon: Ruby Cola, MD;  Location: [place];  Service: ENT;  Laterality: N/A;  . LARYNGECTOMY    . Porta cath removal    . PORTACATH PLACEMENT  09/17/10   Tip in cavoatrial junction  . STOMAPLASTY N/A 10/21/2016   Procedure: Zola Button;  Surgeon: [name], MD;  Location: [place];  Service: ENT;  Laterality: N/A;  . TRACHEAL DILITATION  07/16/2011   Procedure: TRACHEAL DILITATION;  Surgeon: Beckie Salts, MD;  Location: [place];  Service: ENT;  Laterality: N/A;  dilation of tracheal stoma and replacement of stoma tube  . TUBAL LIGATION  1982    FHx:   Family History  Problem Relation Age of Onset  . Heart disease Mother   . Heart disease Father   . Heart disease Sister   . Breast  cancer Neg Hx    SocHx:  reports that she quit smoking about 7 years ago. Her smoking use included cigarettes. She has a 40.00 pack-year smoking history. She has never used smokeless tobacco. She reports that she drinks alcohol. She reports that she does not use drugs.  ALLERGIES:  Allergies  Allergen Reactions  . Lisinopril Cough     (Not in a hospital admission)  No results found for this or any previous visit (from the past 48 hour(s)). Dg Chest Port 1 View  Result Date: 01/08/2018 CLINICAL DATA:  Shortness of breath EXAM: PORTABLE CHEST 1 VIEW COMPARISON:  3/8/9 FINDINGS: Cardiac shadows within  normal limits. The right lung is hyperinflated. Diffuse volume loss on the left is noted likely related to diffuse patchy infiltrate. No sizable effusion is noted at this time. No bony abnormality is seen. IMPRESSION: Volume loss on the left with diffuse infiltrative change. This may be related to some central mucous plugging. Hyperinflation of the right lung is noted. Electronically Signed   By: Inez Catalina M.D.   On: 01/08/2018 07:50     Blood pressure (!) 162/82, pulse 94, resp. rate 20, height 5\' 7"  (1.702 m), weight 98.4 kg (217 lb), SpO2 92 %.  PHYSICAL EXAM: Overall appearance:  Short, heavyset. Breathing was unlabored through the laryngoscope. Head:NCAT Ears:not examined Nose:not examined Oral Cavity:not examined Oral Pharynx/Hypopharynx/Larynx: not examined Neuro:grossly intact. Neck:  Relatively small laryngostome with slight bloody secretions.    Studies Reviewed: none    Assessment/Plan Left mainstem bronchus versus anterior segment aspirated foreign body. History of laryngectomy with tracheoesophageal puncture and prosthesis for voice production.  Plan: To OR for retrieval. If we can replace the prosthesis, we will do so. Otherwise we will place a catheter in the tract to protect it and she will have to go down to [facility]. Jodi Marble 1/93/7902, 10:17 AM

## 2018-01-08 NOTE — Consult Note (Signed)
Name: Deborah [name] MRN: [id number] DOB: [birth year]    ADMISSION DATE:  01/08/2018 CONSULTATION DATE: 01/08/2018  REFERRING MD : Dr. Ralene Bathe  CHIEF COMPLAINT: Dyspnea, foreign body in the airway  BRIEF PATIENT DESCRIPTION:  Asked to evaluate for foreign body in airway Patient with head and neck cancer for which he had total laryngectomy and radiation Transesophageal prosthesis was in place for speech, became dislodged and was replaced at [facility] It became dislodged again and she aspirated the device Came into the hospital secondary to shortness of breath and persistent coughing   SIGNIFICANT EVENTS   Progressive shortness of breath led to presentation to the hospital She continues to cough Foreign body was visualized by the emergency department physician ENT consulted Pulmonary consulted   HISTORY OF PRESENT ILLNESS:   Status post laryngectomy  tracheostomy dependent  PAST MEDICAL HISTORY :   has a past medical history of Anemia, Arthritis, Asthma, Bronchitis, COPD (chronic obstructive pulmonary disease) ([place]), Diverticulosis (11/15/2012 ), Dyspnea, Esophageal stricture, Former smoker (03/19/2011), GERD (gastroesophageal reflux disease), Heart murmur, History of laryngectomy, radiation therapy (09/03/10 to 10/16/2010), Hypertension, Hypothyroid, Internal hemorrhoid (11/15/2012 ), Larynx cancer ([place]) (07/31/2010), Leukocytopenia, Nausea alone (07/28/2013), Neck pain (01/21/2012), Normal MRI (07/14/11), Pneumonia (2012), Sciatica, Seizures ([place]), Sepsis ([place]) (08/04/12), Sinusitis, chronic (07/20/2011), Sinusitis, chronic (07/20/2011), and 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[place]).  has a past surgical history that includes Portacath placement (09/17/10); Laryngectomy; Tracheal dilitation (07/16/2011); Foreign body removal bronchial (10/02/2011); esophagoscopy (06/21/2012); Tubal ligation (1982); Colonoscopy (N/A, 11/15/2012); Esophagoscopy with dilitation (N/A, 09/21/2014); Porta cath removal; Dental  restoration/extraction with x-ray; Esophagoscopy with dilitation (N/A, 07/04/2016); Stomaplasty (N/A, 10/21/2016); and Esophagoscopy with dilitation (N/A, 12/01/2017). Prior to Admission medications   Medication Sig Start Date End Date Taking? Authorizing Provider  amLODipine (NORVASC) 10 MG tablet TAKE 1 TABLET BY MOUTH ONCE DAILY 01/05/18   Bufford Lope, DO  buPROPion (WELLBUTRIN XL) 150 MG 24 hr tablet TAKE 1 TABLET BY MOUTH ONCE DAILY 01/05/18   Bufford Lope, DO  chlorpheniramine (CHLOR-TRIMETON) 4 MG tablet Take 4 mg by mouth every 6 (six) hours as needed for allergies.    [provider]  DULERA 200-5 MCG/ACT AERO INHALE 2 PUFFS BY MOUTH TWICE DAILY 11/13/17   [name], MD  furosemide (LASIX) 20 MG tablet Take 1 tablet (20 mg total) by mouth daily as needed (for swelling or shortness of breath). 10/01/17 12/30/17  [name], PA  levothyroxine (SYNTHROID, LEVOTHROID) 200 MCG tablet Take 1 tablet (200 mcg total) by mouth daily before breakfast. 08/21/17   [name], MD  MELATONIN PO Take 3 mg by mouth at bedtime as needed (sleep).     [provider]  meloxicam (MOBIC) 7.5 MG tablet TAKE 1 TABLET BY MOUTH ONCE DAILY 01/05/18   Orson Eva J, DO  metoprolol tartrate (LOPRESSOR) 50 MG tablet TAKE 1 TABLET BY MOUTH TWICE DAILY 11/13/17   Tanda Rockers, MD  PROVENTIL HFA [phone] Base) MCG/ACT inhaler INHALE TWO PUFFS BY MOUTH EVERY 4 HOURS AS NEEDED FOR WHEEZING FOR SHORTNESS OF BREATH 07/13/17   [name], MD  TRANSDERM-SCOP, 1.5 MG, 1 MG/3DAYS PLACE 1 PATCH ONTO THE SKIN EVERY 3 DAYS AS NEEDED 08/21/17   [name], MD   Allergies  Allergen Reactions  . Lisinopril Cough    FAMILY HISTORY:  family history includes Heart disease in her father, mother, and sister. SOCIAL HISTORY:  reports that she quit smoking about 7 years ago. Her smoking use included  cigarettes. She has a 40.00 pack-year smoking history. She has never used smokeless  tobacco. She reports that she drinks alcohol. She reports that she does not use drugs.  REVIEW OF SYSTEMS:   Constitutional: Negative for fever, chills, weight loss HENT: Negative for hearing loss.   Eyes: Negative for discharge and redness.  Respiratory: Has been coughing, shortness of breath Cardiovascular: Negative for chest pain, palpitations.  Gastrointestinal: No nausea or vomiting  genitourinary: Negative for dysuria, urgency, frequency, hematuria and flank pain.  Musculoskeletal: Negative for myalgias, back pain, joint pain and falls.  Skin: Negative for itching and rash.  Neurological: Negative for dizziness, tingling, tremors, sensory change, speech change, focal weakness, seizures, loss of consciousness, weakness and headaches.  Endo/Heme/Allergies: Negative for environmental allergies and polydipsia. Does not bruise/bleed easily.  SUBJECTIVE:   VITAL SIGNS: Temp:  [97.6 F (36.4 C)] 97.6 F (36.4 C) (07/26 1039) Pulse Rate:  [73-110] (P) 77 (07/26 1100) Resp:  [13-28] (P) 20 (07/26 1100) BP: (132-227)/(82-136) (P) 157/92 (07/26 1100) SpO2:  [85 %-100 %] (P) 100 % (07/26 1100) FiO2 (%):  [95 %] 95 % (07/26 1039) Weight:  [217 lb (98.4 kg)] 217 lb (98.4 kg) (07/26 0832)  PHYSICAL EXAMINATION: General: Awake and alert, increased work of breathing Neuro: Awake and alert HEENT: Not examined Cardiovascular: S1-S2 appreciated  lungs: Decreased air entry on the left Abdomen: Bowel sounds appreciated Musculoskeletal: Moving all extremities Skin: No remarkable findings  No results for input(s): NA, K, CL, CO2, BUN, CREATININE, GLUCOSE in the last 168 hours. No results for input(s): HGB, HCT, WBC, PLT in the last 168 hours. Dg Chest Port 1 View  Result Date: 01/08/2018 CLINICAL DATA:  Shortness of breath EXAM: PORTABLE CHEST 1 VIEW COMPARISON:  3/8/9 FINDINGS: Cardiac shadows within normal limits. The right lung is hyperinflated. Diffuse volume loss on the left is noted  likely related to diffuse patchy infiltrate. No sizable effusion is noted at this time. No bony abnormality is seen. IMPRESSION: Volume loss on the left with diffuse infiltrative change. This may be related to some central mucous plugging. Hyperinflation of the right lung is noted. Electronically Signed   By: Inez Catalina M.D.   On: 01/08/2018 07:50   Chest x-ray reviewed by myself showing left lung atelectasis  ASSESSMENT / PLAN:  Aspiration of foreign body-aspiration of tracheoesophageal prostheses Shortness of breath Abnormal chest x-ray showing atelectasis of the left lung  Bronchoscopy was performed at bedside in the emergency department--by myself attempting to retrieve the foreign body that was lodged in the left lower lobe, performed through stoma Was unable to retrieve the prosthetic device  Patient  taken to the operating room by [name] Assisted with rigid bronchoscopy   Foreign body was retrieved via rigid bronchoscopy  Patient tolerated procedure well Plan will be to discharge today  No follow-up by pulmonary planned at present    Pulmonary and [place] Pager: [phone]  01/08/2018, 11:18 AM

## 2018-01-08 NOTE — Transfer of Care (Signed)
Immediate Anesthesia Transfer of Care Note  Patient: Deborah [name]  Procedure(s) Performed: REMOVAL FOREIGN BODY BRONCHIAL (N/A Bronchus)  Patient Location: PACU  Anesthesia Type:General  Level of Consciousness: awake, drowsy and patient cooperative  Airway & Oxygen Therapy: Patient Spontanous Breathing and Patient connected to tracheostomy mask oxygen  Post-op Assessment: Report given to RN, Post -op Vital signs reviewed and stable and Patient moving all extremities  Post vital signs: Reviewed and stable  Last Vitals:  Vitals Value Taken Time  BP 149/91 01/08/2018 10:39 AM  Temp    Pulse 74 01/08/2018 10:41 AM  Resp 18 01/08/2018 10:41 AM  SpO2 98 % 01/08/2018 10:41 AM  Vitals shown include unvalidated device data.  Last Pain:  Vitals:   01/08/18 0812  PainSc: 10-Worst pain ever         Complications: No apparent anesthesia complications

## 2018-01-08 NOTE — ED Provider Notes (Signed)
[facility] DEPT Provider Note   CSN: [id number] Arrival date & time: 01/08/18  4315     History   Chief Complaint Chief Complaint  Patient presents with  . Airway Obstruction    HPI SKYANNE WELLE is a 62 y.o. female.  The history is provided by the patient. No language interpreter was used.   MUSKAN BOLLA is a 62 y.o. female who presents to the Emergency Department complaining of airway blockage.  Level V caveat due to respiratory distress. She has a history of laryngeal cancer status post layering ectomy. She has a TEP prosthesis that was replaced yesterday. Today when she was driving at the prosthesis fell into her airway. She feels like it is stuck low down in her chest and she is having difficulty breathing. Past Medical History:  Diagnosis Date  . Anemia   . Arthritis    in knees  . Asthma   . Bronchitis   . COPD (chronic obstructive pulmonary disease) ([place])   . Diverticulosis 11/15/2012    noted on screening colonoscopy   . Dyspnea    with COPD exerbation  . Esophageal stricture   . Former smoker 03/19/2011  . GERD (gastroesophageal reflux disease)   . Heart murmur    asymptomatic   . History of laryngectomy   . 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 Hypertension   . 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 Internal hemorrhoid 11/15/2012    small, noted on screening colonoscopy   . 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  . Leukocytopenia   . Nausea alone 07/28/2013  . Neck pain 01/21/2012  . Normal MRI 07/14/11   negative for mestasis   . Pneumonia 2012  . Sciatica   . Seizures ([place])    07/24/11 off Effexor w/o seizure  . Sepsis ([place]) 08/04/12  . Sinusitis, chronic 07/20/2011  . Sinusitis, chronic 07/20/2011   Bilateral maxillary, identified on MRI of head 07/14/11.    . Tracheostomy dependent [facility])     Patient Active Problem List   Diagnosis  Date Noted  . Cough variant asthma 09/20/2017  . Obesity 08/24/2017  . Depressed state 07/27/2017  . Plantar fasciitis 06/06/2016  . Low back pain 05/21/2016  . Health care maintenance 12/29/2015  . Osteoarthritis of knees, bilateral 05/31/2015  . Dysphagia 08/16/2014  . Weight gain 08/05/2014  . COPD exacerbation ([place]) 04/29/2014  . DOE (dyspnea on exertion) 04/28/2014  . Anxiety state, unspecified 01/18/2014  . Frequent falls 12/23/2013  . Bilateral shoulder pain 04/07/2013  . Anemia in chronic illness 08/11/2012  . Status post trachelectomy 08/04/2012  . Chronic pain 01/20/2012  . History of head and neck cancer 09/26/2011  . Hx of radiation therapy   . Seizure ([place]) 07/17/2011  . Acquired hypothyroidism 03/19/2011  . Essential hypertension 03/19/2011    Past Surgical History:  Procedure Laterality Date  . COLONOSCOPY N/A 11/15/2012   Procedure: COLONOSCOPY;  Surgeon: Lafayette Dragon, MD;  Location: [facility];  Service: Endoscopy;  Laterality: N/A;  . DENTAL RESTORATION/EXTRACTION WITH X-RAY    . ESOPHAGOSCOPY  06/21/2012   Procedure: ESOPHAGOSCOPY;  Surgeon: [name], MD;  Location: [facility];  Service: ENT;  Laterality: N/A;  . ESOPHAGOSCOPY WITH DILITATION N/A 09/21/2014   Procedure: ESOPHAGOSCOPY WITH DILITATION;  Surgeon: [name], MD;  Location: [facility];  Service: ENT;  Laterality: N/A;  . ESOPHAGOSCOPY WITH  DILITATION N/A 07/04/2016   Procedure: ESOPHAGOSCOPY WITH DILITATION;  Surgeon: [name], MD;  Location: [facility];  Service: ENT;  Laterality: N/A;  . ESOPHAGOSCOPY WITH DILITATION N/A 12/01/2017   Procedure: ESOPHAGOSCOPY WITH DILITATION;  Surgeon: [name], MD;  Location: [facility];  Service: ENT;  Laterality: N/A;  . FOREIGN BODY REMOVAL BRONCHIAL  10/02/2011   Procedure: REMOVAL FOREIGN BODY BRONCHIAL;  Surgeon: Ruby Cola, MD;  Location: [facility];  Service: ENT;  Laterality: N/A;  . LARYNGECTOMY    . Porta cath removal    . PORTACATH PLACEMENT   09/17/10   Tip in cavoatrial junction  . STOMAPLASTY N/A 10/21/2016   Procedure: Zola Button;  Surgeon: [name], MD;  Location: [facility];  Service: ENT;  Laterality: N/A;  . TRACHEAL DILITATION  07/16/2011   Procedure: TRACHEAL DILITATION;  Surgeon: Beckie Salts, MD;  Location: [facility];  Service: ENT;  Laterality: N/A;  dilation of tracheal stoma and replacement of stoma tube  . TUBAL LIGATION  1982     OB History   None      Home Medications    Prior to Admission medications   Medication Sig Start Date End Date Taking? Authorizing Provider  amLODipine (NORVASC) 10 MG tablet TAKE 1 TABLET BY MOUTH ONCE DAILY 01/05/18   Bufford Lope, DO  buPROPion (WELLBUTRIN XL) 150 MG 24 hr tablet TAKE 1 TABLET BY MOUTH ONCE DAILY 01/05/18   Bufford Lope, DO  chlorpheniramine (CHLOR-TRIMETON) 4 MG tablet Take 4 mg by mouth every 6 (six) hours as needed for allergies.    [provider]  DULERA 200-5 MCG/ACT AERO INHALE 2 PUFFS BY MOUTH TWICE DAILY 11/13/17   [name], MD  furosemide (LASIX) 20 MG tablet Take 1 tablet (20 mg total) by mouth daily as needed (for swelling or shortness of breath). 10/01/17 12/30/17  [name], PA  levothyroxine (SYNTHROID, LEVOTHROID) 200 MCG tablet Take 1 tablet (200 mcg total) by mouth daily before breakfast. 08/21/17   [name], MD  MELATONIN PO Take 3 mg by mouth at bedtime as needed (sleep).     [provider]  meloxicam (MOBIC) 7.5 MG tablet TAKE 1 TABLET BY MOUTH ONCE DAILY 01/05/18   Orson Eva J, DO  metoprolol tartrate (LOPRESSOR) 50 MG tablet TAKE 1 TABLET BY MOUTH TWICE DAILY 11/13/17   Tanda Rockers, MD  nystatin cream (MYCOSTATIN) Apply 1 application topically 2 (two) times daily as needed. RASH 11/13/17   [name], MD  omeprazole (PRILOSEC) 40 MG capsule TAKE 1 CAPSULE BY MOUTH ONCE DAILY IN THE MORNING **  TAKE  AT  LEAST  4  HOURS  AFTER  TAKING  LEVOTHYROXINE** 01/05/18   Bufford Lope, DO  PROVENTIL HFA  108 (90 Base) MCG/ACT inhaler INHALE TWO PUFFS BY MOUTH EVERY 4 HOURS AS NEEDED FOR WHEEZING FOR SHORTNESS OF BREATH 07/13/17   [name], MD  Spacer/Aero Chamber Mouthpiece MISC Use spacer with all inhalers 02/10/17   [name], MD  TRANSDERM-SCOP, 1.5 MG, 1 MG/3DAYS PLACE 1 PATCH ONTO THE SKIN EVERY 3 DAYS AS NEEDED 08/21/17   [name], MD    Family History Family History  Problem Relation Age of Onset  . Heart disease Mother   . Heart disease Father   . Heart disease Sister   . Breast cancer Neg Hx     Social History Social History   Tobacco Use  . Smoking status: Former Smoker  Packs/day: 2.00    Years: 20.00    Pack years: 40.00    Types: Cigarettes    Last attempt to quit: 09/17/2010    Years since quitting: 7.3  . Smokeless tobacco: Never Used  Substance Use Topics  . Alcohol use: Yes    Comment: Socially drinks   . Drug use: No    Comment: tried cocaine 1 time 2010 only used 1 time     Allergies   Lisinopril   Review of Systems Review of Systems  All other systems reviewed and are negative.    Physical Exam Updated Vital Signs BP (!) 152/99 (BP Location: Right Arm)   Pulse 84   Resp (!) 27   SpO2 99%   Physical Exam  Constitutional: She is oriented to person, place, and time. She appears well-developed and well-nourished.  HENT:  Head: Normocephalic and atraumatic.  Neck:  Stoma in anterior neck with no visible foreign body.  Cardiovascular: Normal rate and regular rhythm.  No murmur heard. Pulmonary/Chest:  Tachypnea, absent breath sounds in left lung fields.  Frequent coughing.   Abdominal: Soft. There is no tenderness. There is no rebound and no guarding.  Musculoskeletal: She exhibits no edema or tenderness.  Neurological: She is alert and oriented to person, place, and time.  Skin: Skin is warm and dry.  Psychiatric: She has a normal mood and affect. Her behavior is normal.  Nursing note and vitals  reviewed.    ED Treatments / Results  Labs (all labs ordered are listed, but only abnormal results are displayed) Labs Reviewed - No data to display  EKG None  Radiology No results found.  Procedures .Sedation Date/Time: 01/08/2018 5:46 PM Performed by: Quintella Reichert, MD Authorized by: Quintella Reichert, MD   Consent:    Consent obtained:  Emergent situation Universal protocol:    Immediately prior to procedure a time out was called: yes   Indications:    Procedure performed:  Foreign body removal Pre-sedation assessment:    Time since last food or drink:  Unkn   NPO status caution: unable to specify NPO status     ASA classification: class 3 - patient with severe systemic disease     Neck mobility: normal     Mouth opening:  3 or more finger widths   Mallampati score:  I - soft palate, uvula, fauces, pillars visible   Pre-sedation assessments completed and reviewed: airway patency   Immediate pre-procedure details:    Reassessment: Patient reassessed immediately prior to procedure   Procedure details (see MAR for exact dosages):    Total Provider sedation time (minutes):  30   (including critical care time) CRITICAL CARE Performed by: Quintella Reichert   Total critical care time: 90 minutes  Critical care time was exclusive of separately billable procedures and treating other patients.  Critical care was necessary to treat or prevent imminent or life-threatening deterioration.  Critical care was time spent personally by me on the following activities: development of treatment plan with patient and/or surrogate as well as nursing, discussions with consultants, evaluation of patient's response to treatment, examination of patient, obtaining history from patient or surrogate, ordering and performing treatments and interventions, ordering and review of laboratory studies, ordering and review of radiographic studies, pulse oximetry and re-evaluation of patient's  condition.  Medications Ordered in ED Medications - No data to display   Initial Impression / Assessment and Plan / ED Course  I have reviewed the triage vital signs and the nursing  notes.  Pertinent labs & imaging results that were available during my care of the patient were reviewed by me and considered in my medical decision making (see chart for details).     Patient presents to the emergency department for evaluation of difficulty breathing after her TEP prosthesis became lodged in her airway. Patient in respiratory distress on ED arrival. She was treated with positioning, supplemental oxygen. Attempted to provide lidocaine neb for analgesia with worsening hypoxia and this was discontinued. ENT consulted for bronchoscopic retrieval. Pulmonary also consulted for assistance in retrieval and airway management. Retrieval was attempted at the bedside by consulting services and needed to be transition to the OR for additional equipment.   Records reviewed in [place].    Final Clinical Impressions(s) / ED Diagnoses   Final diagnoses:  None    ED Discharge Orders    None       Quintella Reichert, MD 01/08/18 1749

## 2018-01-08 NOTE — ED Notes (Addendum)
Respiratory at bedside suctioning tracheostomy and is now receiving Lidocaine through non rebreather.

## 2018-01-08 NOTE — ED Notes (Addendum)
2nd vial of lidocaine at beside. Called pharmacy for a 3rd dose of lidocaine. MD at bedside.

## 2018-01-08 NOTE — ED Notes (Signed)
100mg  ketamine given IV push at this time.

## 2018-01-08 NOTE — Procedures (Signed)
Bronchoscopy Procedure Note Deborah [name] [id number] [birth year]  Procedure: Bronchoscopy Indications: Diagnostic evaluation of the airways, retrieval of foreign body in the left main  Procedure Details Consent: Unable to obtain consent because of emergent medical necessity. Time Out: Verified patient identification, verified procedure, site/side was marked, verified correct patient position, special equipment/implants available, medications/allergies/relevent history reviewed, required imaging and test results available.  Performed  In preparation for procedure, patient was given 100% FiO2, bronchoscope lubricated and lidocaine given via ETT 620mg  in total. Sedation: Benzodiazepines and Ketamine  Airway entered and the following bronchi were examined: Airway was examined, foreign body located in left lower lobe Procedures performed:  Disposable scope was used multiple attempts at retrieval of foreign body by [name] (ENT), attempts by myself Bronchoscope removed after multiple failed attempts, access to foreign body was difficult.    Evaluation Hemodynamic Status: Remained hemodynamically stable; O2 sats: transiently fell during during procedure Patient's Current Condition: stable Specimens:  None Complications: No apparent complications Patient did tolerate procedure well.  Was unable to retrieve the foreign body, patient will be taken to the operating room for foreign body removal with rigid bronchoscopy  Deborah [name] 01/08/2018

## 2018-01-08 NOTE — Progress Notes (Signed)
Assisted Dr Erik Obey with beside bronchoscopy in attempt to remove prosthesis from airway. Regular scope used. Pt on 100 % during procedure. Attempts unsuccessful pt taken to OR.

## 2018-01-08 NOTE — Anesthesia Procedure Notes (Signed)
Procedure Name: Intubation Date/Time: 01/08/2018 10:00 AM Performed by: Mitzie Na, CRNA Pre-anesthesia Checklist: Timeout performed, Patient being monitored, Suction available, Emergency Drugs available and Patient identified Patient Re-evaluated:Patient Re-evaluated prior to induction Oxygen Delivery Method: Circle system utilized Preoxygenation: Pre-oxygenation with 100% oxygen (Via non-rebreather over trach site) Induction Type: IV induction Tube type: Oral Tube size: 6.5 mm Number of attempts: 1 Airway Equipment and Method: Tracheostomy Placement Confirmation: positive ETCO2 and breath sounds checked- equal and bilateral ETT to lip (cm): balloon cuff visible at stoma opening. Tube secured with: Tape Dental Injury: Teeth and Oropharynx as per pre-operative assessment

## 2018-01-08 NOTE — ED Notes (Addendum)
Patient unable to speak. Patient texting on phone. Husband at bedside. Save blood tubes sent down to lab and ready when orders are in (pink, light green, lavender, blue). Suction set up at bedside.

## 2018-01-08 NOTE — ED Notes (Signed)
ENT called

## 2018-01-08 NOTE — ED Notes (Addendum)
Respiratory called

## 2018-01-08 NOTE — ED Notes (Signed)
Total 620mg  of lidocaine given while patient in ED.

## 2018-01-08 NOTE — ED Notes (Addendum)
MD aware of patients pain in left lung.

## 2018-01-08 NOTE — Op Note (Signed)
01/08/2018  10:24 AM    [name]  [id number]   Pre-Op Dx:  Left bronchial foreign body  Post-op Dx: same  Proc: rigid bronchoscopy with retrieval, foreign body. Replacement of tracheoesophageal fistula prosthesis.   Surg:  Jodi Marble T MD  Asst: Dr. Verdie Shire, Pulmonologist  Anes:  G per tracheostome  EBL:  none  Comp:  none  Findings:  Silastic foreign body in bronchus  Procedure: with the patient in a comfortable supine position, trachea stomal intubation was performed. Oxygen and carbon dioxide were well-controlled.  Routine surgical timeout was obtained.  Patient was extubated in the #7 ventilating bronchoscope was placed in advanced down into the left mainstem bronchus. Foreign body was identified.  Secretions were suctioned. A straight grasping forceps was placed and the prosthesis was successfully removed. Repeat bronchoscopy showed slight mucosal edema but no obstruction and no additional foreign bodies. The bronchoscope was removed and the patient was reintubated.  Under direct vision, using a small hemostat, the prosthesis was replaced into the fistula tract successfully.  At this point the procedure was completed. The patient was returned to anesthesia, awakened, extubated, and transferred to recovery in stable condition.  Dispo:   PACU to home  Plan:  Routine care of the prosthesis.  Tyson Alias MD

## 2018-01-08 NOTE — Progress Notes (Signed)
100% NRB placed over Stoma. Pts sats are 95%.

## 2018-01-08 NOTE — ED Notes (Addendum)
Respiratory at bedside.

## 2018-01-08 NOTE — Anesthesia Preprocedure Evaluation (Addendum)
Anesthesia Evaluation  Patient identified by MRN, date of birth, ID band Patient confused    Reviewed: Patient's Chart, lab work & pertinent test results, Unable to perform ROS - Chart review only  Airway Mallampati: Trach   Neck ROM: Limited    Dental   Pulmonary former smoker,    + rhonchi        Cardiovascular hypertension,  Rhythm:Regular Rate:Tachycardia     Neuro/Psych    GI/Hepatic   Endo/Other    Renal/GU      Musculoskeletal   Abdominal   Peds  Hematology   Anesthesia Other Findings   Reproductive/Obstetrics                            Anesthesia Physical Anesthesia Plan  ASA: IV and emergent  Anesthesia Plan: General   Post-op Pain Management:    Induction: Intravenous  PONV Risk Score and Plan: Ondansetron and Propofol infusion  Airway Management Planned: Tracheostomy  Additional Equipment:   Intra-op Plan:   Post-operative Plan: Extubation in OR  Informed Consent: I have reviewed the patients History and Physical, chart, labs and discussed the procedure including the risks, benefits and alternatives for the proposed anesthesia with the patient or authorized representative who has indicated his/her understanding and acceptance.     Plan Discussed with: CRNA and Anesthesiologist  Anesthesia Plan Comments:         Anesthesia Quick Evaluation

## 2018-01-08 NOTE — ED Notes (Addendum)
MD at bedside. 

## 2018-01-08 NOTE — ED Notes (Signed)
Consent form signed by husband.

## 2018-01-08 NOTE — ED Notes (Addendum)
Lidocaine HCl (PF( 4% INJ (60ml) at bedside per Benjamine Mola, MD.

## 2018-01-08 NOTE — ED Notes (Signed)
MD and respiratory notified of patients increased struggle to breathe. Patients 02 85%-90% non rebreather.

## 2018-01-08 NOTE — Anesthesia Postprocedure Evaluation (Signed)
Anesthesia Post Note  Patient: Kristine Royal Perkinson  Procedure(s) Performed: REMOVAL FOREIGN BODY BRONCHIAL (N/A Bronchus)     Patient location during evaluation: PACU Anesthesia Type: General Level of consciousness: awake and alert Pain management: pain level controlled Vital Signs Assessment: post-procedure vital signs reviewed and stable Respiratory status: spontaneous breathing, nonlabored ventilation, respiratory function stable and patient connected to nasal cannula oxygen Cardiovascular status: blood pressure returned to baseline and stable Postop Assessment: no apparent nausea or vomiting Anesthetic complications: no    Last Vitals:  Vitals:   01/08/18 1145 01/08/18 1156  BP: 106/73 (!) 136/49  Pulse: 73 71  Resp: 18 16  Temp: 36.5 C 36.5 C  SpO2: 96% 94%    Last Pain:  Vitals:   01/08/18 1156  PainSc: 0-No pain                 Dreamer Carillo COKER

## 2018-01-08 NOTE — Discharge Instructions (Signed)
Routine care of stoma and prosthesis.  You may come back to see me in 2 weeks, or go [place] as you please.  May call with problems, [phone].

## 2018-01-09 ENCOUNTER — Encounter (HOSPITAL_COMMUNITY): Payer: Self-pay | Admitting: Otolaryngology

## 2018-01-15 ENCOUNTER — Ambulatory Visit: Payer: Medicaid Other | Admitting: Family Medicine

## 2018-01-15 ENCOUNTER — Encounter: Payer: Self-pay | Admitting: Family Medicine

## 2018-01-15 ENCOUNTER — Ambulatory Visit (INDEPENDENT_AMBULATORY_CARE_PROVIDER_SITE_OTHER): Payer: Medicaid Other | Admitting: Family Medicine

## 2018-01-15 DIAGNOSIS — J0410 Acute tracheitis without obstruction: Secondary | ICD-10-CM | POA: Diagnosis not present

## 2018-01-15 MED ORDER — DOXYCYCLINE HYCLATE 100 MG PO TABS: 100.0000 mg | ORAL_TABLET | Freq: Two times a day (BID) | ORAL | 0 refills | Status: AC

## 2018-01-15 MED ORDER — PREDNISONE 10 MG PO TABS: 50.0000 mg | ORAL_TABLET | Freq: Every day | ORAL | 0 refills | Status: AC

## 2018-01-15 NOTE — Progress Notes (Signed)
    Subjective:  Deborah [name] is a 62 y.o. female who presents to the [facility] today with a chief complaint of cough x 2weeks.   HPI: Patient with tracheostomy s/p nasal passage closing due to cancer is here with c/o 2 weeks dry cough, subjective fever, and no other physical symptoms.  She said this happens quit a bit.  She is a prior smoker with COPD but this does not feel like COPD to her.  She says normally she is given some antibiotics and everything works out fine.   Objective:  Physical Exam: Gen: NAD, resting comfortably, pleasant CV: RRR with no murmurs appreciated Pulm: NWOB, CTAB with no crackles, wheezes, or rhonchi, has tracheostomy MSK: no edema, cyanosis, or clubbing noted Skin: warm, dry Neuro: grossly normal, moves all extremities Psych: Normal affect and thought content  No results found for this or any previous visit (from the past 72 hour(s)).   Assessment/Plan:  Tracheitis Non-productive cough for ~2wks.  Lungs sound clear, only subjective fever symptoms  5 days prednisone 50 10 days doxy Return precautions   Sherene Sires, [place] - PGY2 01/15/2018 10:52 AM

## 2018-01-15 NOTE — Patient Instructions (Signed)
It was a pleasure to see you today! Thank you for choosing [facility] for your primary care. Deborah [name] was seen for tracheitis. Come back to the clinic if you have other routiune needs, and go to the emergency room if you have any life threatening symptoms.   Today we ordered 5 days of a steroid called prednisone and 10days of an antibiotic called doxycycline.  Don't forget to take your dulera twice per day.  If you don't hear from [place] in two weeks, please give [place] a call to verify your results. Otherwise, we look forward to seeing you again at your next visit. If you have any questions or concerns before then, please call the clinic at [phone].   Please bring all your medications to every doctors visit   Sign up for My Chart to have easy access to your labs results, and communication with your Primary care physician.     Please check-out at the front desk before leaving the clinic.     Best,  Dr. Sherene Sires FAMILY MEDICINE RESIDENT - PGY2 01/15/2018 10:23 AM

## 2018-01-15 NOTE — Assessment & Plan Note (Signed)
Non-productive cough for ~2wks.  Lungs sound clear, only subjective fever symptoms  5 days prednisone 50 10 days doxy Return precautions

## 2018-01-19 ENCOUNTER — Ambulatory Visit (HOSPITAL_COMMUNITY): Payer: Self-pay | Admitting: Dentistry

## 2018-01-19 ENCOUNTER — Encounter (HOSPITAL_COMMUNITY): Payer: Self-pay | Admitting: Dentistry

## 2018-01-19 VITALS — BP 156/91 | HR 74 | Temp 98.4°F

## 2018-01-19 DIAGNOSIS — Z5189 Encounter for other specified aftercare: Secondary | ICD-10-CM | POA: Diagnosis not present

## 2018-01-19 DIAGNOSIS — K0820 Unspecified atrophy of edentulous alveolar ridge: Secondary | ICD-10-CM | POA: Diagnosis not present

## 2018-01-19 DIAGNOSIS — K08109 Complete loss of teeth, unspecified cause, unspecified class: Secondary | ICD-10-CM

## 2018-01-19 DIAGNOSIS — K117 Disturbances of salivary secretion: Secondary | ICD-10-CM | POA: Diagnosis not present

## 2018-01-19 DIAGNOSIS — Z463 Encounter for fitting and adjustment of dental prosthetic device: Secondary | ICD-10-CM | POA: Diagnosis not present

## 2018-01-19 DIAGNOSIS — K000 Anodontia: Secondary | ICD-10-CM

## 2018-01-19 DIAGNOSIS — Z972 Presence of dental prosthetic device (complete) (partial): Secondary | ICD-10-CM

## 2018-01-19 DIAGNOSIS — C321 Malignant neoplasm of supraglottis: Secondary | ICD-10-CM | POA: Diagnosis not present

## 2018-01-19 DIAGNOSIS — Z923 Personal history of irradiation: Secondary | ICD-10-CM

## 2018-01-19 NOTE — Progress Notes (Signed)
01/19/2018  Patient:            Deborah [name] Date of Birth:  September 28, 1955 MRN:                [id number]  BP (!) 156/91 (BP Location: Left Arm)   Pulse 74   Temp 98.4 F (36.9 C)    Deborah [name] is a 62 year old female that presents for periodic oral exam and evaluation of upper and lower complete dentures. Patient has had multiple broken appointments for denture evaluation since seen in February of 2018.  Patient was recently evaluated by [facility] resident who prescribed doxycycline and prednisone therapy for tracheitis.     Medical Hx Update:  Past Medical History:  Diagnosis Date  . Anemia   . Arthritis    in knees  . Asthma   . Bronchitis   . COPD (chronic obstructive pulmonary disease) ([place])   . Diverticulosis 11/15/2012    noted on screening colonoscopy   . Dyspnea    with COPD exerbation  . Esophageal stricture   . Former smoker 03/19/2011  . GERD (gastroesophageal reflux disease)   . Heart murmur    asymptomatic   . History of laryngectomy   . 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 Hypertension   . 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 Internal hemorrhoid 11/15/2012    small, noted on screening colonoscopy   . 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  . Leukocytopenia   . Nausea alone 07/28/2013  . Neck pain 01/21/2012  . Normal MRI 07/14/11   negative for mestasis   . Pneumonia 2012  . Sciatica   . Seizures ([place])    07/24/11 off Effexor w/o seizure  . Sepsis ([place]) 08/04/12  . Sinusitis, chronic 07/20/2011  . Sinusitis, chronic 07/20/2011   Bilateral maxillary, identified on MRI of head 07/14/11.    . 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[place])   . ALLERGIES/ADVERSE DRUG REACTIONS: Allergies  Allergen Reactions  . Lisinopril Cough   MEDICATIONS: Current Outpatient Medications  Medication Sig Dispense Refill  . amLODipine (NORVASC) 10 MG tablet TAKE 1 TABLET BY MOUTH ONCE DAILY 90  tablet 3  . buPROPion (WELLBUTRIN XL) 150 MG 24 hr tablet TAKE 1 TABLET BY MOUTH ONCE DAILY 90 tablet 3  . chlorpheniramine (CHLOR-TRIMETON) 4 MG tablet Take 4 mg by mouth every 6 (six) hours as needed for allergies.    [name] doxycycline (VIBRA-TABS) 100 MG tablet Take 1 tablet (100 mg total) by mouth 2 (two) times daily for 10 days. 20 tablet 0  . DULERA 200-5 MCG/ACT AERO INHALE 2 PUFFS BY MOUTH TWICE DAILY 13 g 2  . levothyroxine (SYNTHROID, LEVOTHROID) 200 MCG tablet Take 1 tablet (200 mcg total) by mouth daily before breakfast. 30 tablet 3  . MELATONIN PO Take 3 mg by mouth at bedtime as needed (sleep).     . meloxicam (MOBIC) 7.5 MG tablet TAKE 1 TABLET BY MOUTH ONCE DAILY 30 tablet 0  . metoprolol tartrate (LOPRESSOR) 50 MG tablet TAKE 1 TABLET BY MOUTH TWICE DAILY 60 tablet 2  . predniSONE (DELTASONE) 10 MG tablet Take 5 tablets (50 mg total) by mouth daily with breakfast for 5 days. 25 tablet 0  . PROVENTIL HFA 108 (90 Base) MCG/ACT inhaler INHALE TWO PUFFS BY MOUTH EVERY 4 HOURS AS NEEDED FOR WHEEZING FOR SHORTNESS OF BREATH 14 each 1  . TRANSDERM-SCOP, 1.5 MG,  1 MG/3DAYS PLACE 1 PATCH ONTO THE SKIN EVERY 3 DAYS AS NEEDED 10 patch 2   No current facility-administered medications for this visit.     C/C: Patient presents for periodic oral examination and evaluation of upper and lower complete dentures.   HPI:   Patient had upper and  lower complete dentures inserted on 02/27/2011. Patient had reline procedures done on November 30, 2012. The patient was last seen in February 2018 for denture adjustment.  Patient has had multiple broken appointments for denture evaluation since that time.  The patient now presents for periodic oral examination and evaluation of the upper and lower complete dentures. Patient is not having any problems with her upper or lower complete dentures by report.  DENTAL EXAM: General: Patient is a well-developed, well-nourished female in no acute distress. Tracheostomy  is present. Vitals: BP (!) 156/91 (BP Location: Left Arm)   Pulse 74   Temp 98.4 F (36.9 C)  Extraoral Exam:  There is no palpable lymphadenopathy. There are no TMJ Symptoms. The tracheal stoma is still in place.  Intraoral  Exam: Patient has xerostomia. There are no soft tissue lesions or denture irritation noted.  There is atrophy of the edentulous alveolar ridges. Patient has thin, spiny ridges of the mandibular arch. Dentition: Patient is edentulous. Prosthodontic: The patient has upper and lower complete dentures. Dentures are stable and retentive.  Pressure indicating paste was applied to the dentures and dentures were adjusted as needed. Significant time was allotted to the adjustment of lower complete denture. Dentures were polished.  Patient accepted the results of the denture adjustment. Occlusion:  The denture occlusion was evaluated. The patient has acceptable occlusion. The patient has a tendency to protrude to and past the End to End position. Patient indicates she is able to chew "anything she wants'.  Assessments: 1. Patient is edentulous. 2. There is atrophy of the edentulous alveolar ridges 3. Upper and lower complete dentures are acceptable.   Plan:  1. Patient to followup with Dental Medicine as scheduled for yearly recall .  2. The patient to keep dentures out if sore spots arise. Use salt water rinses as needed aid healing. Call if problems arise before next scheduled appointment.   Lenn Cal, DDS

## 2018-01-19 NOTE — Patient Instructions (Signed)
Plan:  1. Patient to followup with Dental Medicine as scheduled for yearly recall .  2. The patient to keep dentures out if sore spots arise. Use salt water rinses as needed aid healing. Call if problems arise before next scheduled appointment.   Lenn Cal, DDS

## 2018-03-16 ENCOUNTER — Ambulatory Visit: Payer: Medicaid Other | Admitting: Family Medicine

## 2018-03-16 ENCOUNTER — Other Ambulatory Visit: Payer: Self-pay

## 2018-03-16 DIAGNOSIS — G5600 Carpal tunnel syndrome, unspecified upper limb: Secondary | ICD-10-CM

## 2018-03-16 DIAGNOSIS — I10 Essential (primary) hypertension: Secondary | ICD-10-CM | POA: Diagnosis not present

## 2018-03-16 DIAGNOSIS — G5603 Carpal tunnel syndrome, bilateral upper limbs: Secondary | ICD-10-CM

## 2018-03-16 DIAGNOSIS — Z23 Encounter for immunization: Secondary | ICD-10-CM

## 2018-03-16 DIAGNOSIS — M170 Bilateral primary osteoarthritis of knee: Secondary | ICD-10-CM

## 2018-03-16 HISTORY — DX: Carpal tunnel syndrome, unspecified upper limb: G56.00

## 2018-03-16 MED ORDER — WRIST BRACE MISC: 1.0000 [IU] | Freq: Every day | 0 refills | Status: DC

## 2018-03-16 NOTE — Patient Instructions (Signed)
It was a pleasure to see you today! Thank you for choosing [facility] for your primary care. Deborah [name] was seen for hand tingling, knee pain.   Our plans for today were:  You have a flare up of arthitis in your knees. Please use tylenol and ibuprofen every 6 hours for the next few days. You can also use heat. If this doesn't resolve the flare, you can return for injections.   For your hand tingling, this is called carpal tunnel. Please wear the wrist braces at night.   Best,  Dr. Lindell Noe   Carpal Tunnel Syndrome Carpal tunnel syndrome is a condition that causes pain in your hand and arm. The carpal tunnel is a narrow area that is on the palm side of your wrist. Repeated wrist motion or certain diseases may cause swelling in the tunnel. This swelling can pinch the main nerve in the wrist (median nerve). Follow these instructions at home: If you have a splint:  Wear it as told by your doctor. Remove it only as told by your doctor.  Loosen the splint if your fingers: ? Become numb and tingle. ? Turn blue and cold.  Keep the splint clean and dry. General instructions  Take over-the-counter and prescription medicines only as told by your doctor.  Rest your wrist from any activity that may be causing your pain. If needed, talk to your employer about changes that can be made in your work, such as getting a wrist pad to use while typing.  If directed, apply ice to the painful area: ? Put ice in a plastic bag. ? Place a towel between your skin and the bag. ? Leave the ice on for 20 minutes, 2-3 times per day.  Keep all follow-up visits as told by your doctor. This is important.  Do any exercises as told by your doctor, physical therapist, or occupational therapist. Contact a doctor if:  You have new symptoms.  Medicine does not help your pain.  Your symptoms get worse. This information is not intended to replace advice given to you by your health care  provider. Make sure you discuss any questions you have with your health care provider. Document Released: 05/22/2011 Document Revised: 11/08/2015 Document Reviewed: 10/18/2014 Elsevier Interactive Patient Education  [employer].

## 2018-03-16 NOTE — Assessment & Plan Note (Signed)
Exam consistent with this, trial conservative therapy with bilateral wrist braces.

## 2018-03-16 NOTE — Assessment & Plan Note (Signed)
Patient with pain with ambulation worse in am. Exam with ligaments intact, no signs of bakers cyst or effusion. Use conservative therapy such as APAP and ibuprofen, ice, heat, compression stockings. Discussed PT but she says her insurance won't allow. She can return for consideration of injections if no help.

## 2018-03-16 NOTE — Assessment & Plan Note (Signed)
Repeat BP with improved result after resting. Recheck with PCP at next appt.

## 2018-03-16 NOTE — Progress Notes (Signed)
   CC: knee pain, hand tingling  HPI  Swelling in knees - bilaterally, x 3 weeks. Pain with walking and feels stiff. Has to slide out of the bed. Feels liek she can't bend them. They loosen up after 30 min or so. Hurts with bending first thing in the am. Feels calves are swollen. Never before. Has tried muscle cream on them and biofreeze. Never had surgery.   Hand tingling - bilaterally, started with right. Started about 2 weeks ago. She has to shake them when she wakes up to alleviate this. Now on L as well and this worries her. Now every night. Has tried soaking and squeezing   HTN - took amlodipine today. No hx of steroids recently.   ROS: Denies CP, SOB, abdominal pain, dysuria, changes in BMs.   CC, SH/smoking status, and VS noted  Objective: BP 136/86   Pulse 70   Temp 98.4 F (36.9 C) (Oral)   Wt 213 lb (96.6 kg)   SpO2 99%   BMI 33.36 kg/m  Gen: NAD, alert, cooperative, and pleasant. HEENT: NCAT, EOMI, PERRL CV: RRR, no murmur Resp: CTAB, no wheezes, non-labored Ext: +Phalens and tinsels bilaterally, normal ROM and strength of bilateral hands and wrists. +Crepitus to bilateral knees with no warmth, edema, or signs of effusion. Ligaments intact.  Neuro: Alert and oriented, Speech clear, No gross deficits  Assessment and plan:  Osteoarthritis of knees, bilateral Patient with pain with ambulation worse in am. Exam with ligaments intact, no signs of bakers cyst or effusion. Use conservative therapy such as APAP and ibuprofen, ice, heat, compression stockings. Discussed PT but she says her insurance won't allow. She can return for consideration of injections if no help.   Carpal tunnel syndrome Exam consistent with this, trial conservative therapy with bilateral wrist braces.   Essential hypertension Repeat BP with improved result after resting. Recheck with PCP at next appt.    No orders of the defined types were placed in this encounter.   Meds ordered this  encounter  Medications  . Misc. Devices (WRIST BRACE) MISC    Sig: 1 Units by Does not apply route at bedtime. Wear braces on both hands every night.    Dispense:  2 each    Refill:  0    Health Maintenance reviewed - flu shot given today.   Ralene Ok, MD, PGY3 03/16/2018 2:33 PM

## 2018-03-18 ENCOUNTER — Other Ambulatory Visit: Payer: Self-pay | Admitting: Family Medicine

## 2018-03-18 ENCOUNTER — Other Ambulatory Visit: Payer: Self-pay | Admitting: Internal Medicine

## 2018-03-18 DIAGNOSIS — E039 Hypothyroidism, unspecified: Secondary | ICD-10-CM

## 2018-03-18 MED ORDER — LEVOTHYROXINE SODIUM 200 MCG PO TABS: 200.0000 ug | ORAL_TABLET | Freq: Every day | ORAL | 0 refills | Status: DC

## 2018-03-18 NOTE — Telephone Encounter (Signed)
Needs TSH recheck and her dose was changed to 260mcg in February. Will give 30 day refill to schedule lab appointment. Future order placed.

## 2018-03-19 NOTE — Telephone Encounter (Signed)
Attempted to contact patient to schedule a lab visit. LVM for her to call back. Please schedule a lab only visit.

## 2018-03-19 NOTE — Telephone Encounter (Signed)
Pt informed and lab appt made for Tuesday. Fleeger, Salome Spotted, CMA

## 2018-03-23 ENCOUNTER — Other Ambulatory Visit: Payer: Self-pay

## 2018-03-23 ENCOUNTER — Other Ambulatory Visit: Payer: Medicaid Other

## 2018-03-23 ENCOUNTER — Ambulatory Visit (INDEPENDENT_AMBULATORY_CARE_PROVIDER_SITE_OTHER): Payer: Medicaid Other | Admitting: Family Medicine

## 2018-03-23 VITALS — BP 158/80 | HR 76 | Temp 97.9°F | Wt 213.0 lb

## 2018-03-23 DIAGNOSIS — I10 Essential (primary) hypertension: Secondary | ICD-10-CM | POA: Diagnosis not present

## 2018-03-23 DIAGNOSIS — E039 Hypothyroidism, unspecified: Secondary | ICD-10-CM | POA: Diagnosis not present

## 2018-03-23 MED ORDER — PREDNISONE 50 MG PO TABS: ORAL_TABLET | ORAL | 0 refills | Status: DC

## 2018-03-23 NOTE — Progress Notes (Signed)
   CC: hand pain and knee pain  HPI  Hand pain - has been wearing the braces every day and night. Feels light the R hand might be swollen. No improvement in the tingling sensation. No new weakness.   Knee pain - has gotten braces, says "that mess doesn't work." has been taking mobic x 8 months PRN. Last took this yesterday. Says this doesn't work at all. Did take tylenol today. Also tried OTC ibuprofen. Pain feels the same as our previous visit - primarily medial joint line and posterior fossa.   ROS: Denies CP, SOB, abdominal pain, dysuria, changes in BMs.   CC, SH/smoking status, and VS noted  Objective: BP (!) 158/80   Pulse 76   Temp 97.9 F (36.6 C) (Oral)   Wt 213 lb (96.6 kg)   SpO2 96%   BMI 33.36 kg/m  Gen: NAD, alert, cooperative, and pleasant. HEENT: NCAT, EOMI, PERRL CV: RRR, no murmur Resp: CTAB, no wheezes, non-labored Ext: No edema, warm. Continued +Phalens, no swelling of R hand, normal strength and sensation in bilateral hands. +Medial joint line tenderness in both knee, no swelling warmth or erythema.  Neuro: Alert and oriented, Speech clear, No gross deficits  Assessment and plan:  Carpal tunnel syndrome - continue braces. Counseled on expectations that this will not immediately resolve with bracing. Continue APAP PRN.   Knee OA bilateral - patient has been taking ibuprofen and BP is up today from ?pain. Will recheck BMP. In order to avoid additional NSAIDs, will give 5d course of prednisone. Take this with food. Patient counseled on potential side effects of prednisone including hyperglycemia, mood changes.   HTN - BP elevated today likely 2/2 pain. Recheck BMP as above given use of both mobic and ibu.   Orders Placed This Encounter  Procedures  . Basic metabolic panel    Meds ordered this encounter  Medications  . predniSONE (DELTASONE) 50 MG tablet    Sig: Take once daily with food.    Dispense:  5 tablet    Refill:  0    Ralene Ok, MD,  PGY3 03/25/2018 9:30 AM

## 2018-03-23 NOTE — Patient Instructions (Signed)
It was a pleasure to see you today! Thank you for choosing [facility] for your primary care. Deborah [name] was seen for knee and wrist pain.   Our plans for today were:  Try the prednisone for your inflammation pain. Keep wearing the braces. We will check your kidney function today and I will call you. Stop the ibuprofen please.    Best,  Dr. Lindell Noe

## 2018-03-24 ENCOUNTER — Telehealth: Payer: Self-pay | Admitting: Family Medicine

## 2018-03-24 DIAGNOSIS — E039 Hypothyroidism, unspecified: Secondary | ICD-10-CM

## 2018-03-24 LAB — BASIC METABOLIC PANEL
BUN / CREAT RATIO: 21 (ref 12–28)
BUN: 24 mg/dL (ref 8–27)
CO2: 18 mmol/L — ABNORMAL LOW (ref 20–29)
Calcium: 9.4 mg/dL (ref 8.7–10.3)
Chloride: 103 mmol/L (ref 96–106)
Creatinine, Ser: 1.12 mg/dL — ABNORMAL HIGH (ref 0.57–1.00)
GFR calc non Af Amer: 54 mL/min/{1.73_m2} — ABNORMAL LOW (ref >59)
GFR, EST AFRICAN AMERICAN: 62 mL/min/{1.73_m2} (ref >59)
Glucose: 86 mg/dL (ref 65–99)
Potassium: 4.3 mmol/L (ref 3.5–5.2)
SODIUM: 141 mmol/L (ref 134–144)

## 2018-03-24 LAB — TSH: TSH: 56.07 u[IU]/mL — ABNORMAL HIGH (ref 0.450–4.500)

## 2018-03-24 NOTE — Telephone Encounter (Signed)
Attempted to call patient to discuss elevated TSH. Left message asking patient to call back. Planned to inquire how patient was taking this medication as it would seem improbable that the TSH would increase with an increased dose of levothyroxine. This rather suggests medication noncompliance and I would like to explore this with the patient. If she would like to come in for an appointment instead that would also be acceptable.

## 2018-03-25 ENCOUNTER — Telehealth: Payer: Self-pay

## 2018-03-25 DIAGNOSIS — K222 Esophageal obstruction: Secondary | ICD-10-CM | POA: Diagnosis not present

## 2018-03-25 DIAGNOSIS — Z8521 Personal history of malignant neoplasm of larynx: Secondary | ICD-10-CM | POA: Diagnosis not present

## 2018-03-25 NOTE — Telephone Encounter (Signed)
Pt returned call.  She takes 231mcgs in the am 30 minutes before her meals. Fleeger, Salome Spotted, CMA

## 2018-03-25 NOTE — Telephone Encounter (Signed)
-----   Message from Sela Hilding, MD sent at 03/25/2018  9:33 AM EDT ----- [facility] team, please call patient. Her kidney function is better than before but not perfectly normal. She should follow up with Dr. Shawna Orleans soon to discuss this. Continue NOT to take ibuprofen until Dr. Shawna Orleans tells her otherwise.

## 2018-03-25 NOTE — Telephone Encounter (Signed)
Spoke to pt. She will call and schedule an appt and will continue to NOT take ibuprofen until Dr. Shawna Orleans tells her otherwise. Ottis Stain, CMA

## 2018-03-26 NOTE — Telephone Encounter (Signed)
Called patient. Discussed increasing levothyroxine but patient states that she feels well currently. She has scheduled an appointment with me on10/24. She will bring all of her home medications with her and we will check for any drug interactions at that time.

## 2018-03-31 DIAGNOSIS — C329 Malignant neoplasm of larynx, unspecified: Secondary | ICD-10-CM | POA: Diagnosis not present

## 2018-04-01 NOTE — H&P (Signed)
Deborah Gaughran Witherspoonis an 62 y.o.female.  Chief Complaint:Trouble swallowing OMV:EHMCNOB of total laryngectomy and radiation many years ago. Having some trouble swallowing again. Has had successful esophageal dilatation in the past.      Past Medical History:  Diagnosis Date  . Anemia   . Arthritis    in knees  . Asthma   . Bronchitis   . COPD (chronic obstructive pulmonary disease) ([place])   . Diverticulosis 11/15/2012    noted on screening colonoscopy   . Dyspnea    with COPD exerbation  . Esophageal stricture   . Former smoker 03/19/2011  . GERD (gastroesophageal reflux disease)   . Heart murmur    asymptomatic   . History of laryngectomy   . 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 Hypertension   . 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 Internal hemorrhoid 11/15/2012    small, noted on screening colonoscopy   . 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  . Leukocytopenia   . Nausea alone 07/28/2013  . Neck pain 01/21/2012  . Normal MRI 07/14/11   negative for mestasis   . Pneumonia 2012  . Sciatica   . Seizures ([place])    07/24/11 off Effexor w/o seizure  . Sepsis ([place]) 08/04/12  . Sinusitis, chronic 07/20/2011  . Sinusitis, chronic 07/20/2011   Bilateral maxillary, identified on MRI of head 07/14/11.   . Tracheostomy dependent [facility])          Past Surgical History:  Procedure Laterality Date  . COLONOSCOPY N/A 11/15/2012   Procedure: COLONOSCOPY; Surgeon: Lafayette Dragon, MD; Location: [facility]; Service: Endoscopy; Laterality: N/A;  . DENTAL RESTORATION/EXTRACTION WITH X-RAY    . ESOPHAGOSCOPY  06/21/2012   Procedure: ESOPHAGOSCOPY; Surgeon: [name], MD; Location: [place]; Service: ENT; Laterality: N/A;  . ESOPHAGOSCOPY WITH DILITATION N/A 09/21/2014   Procedure: ESOPHAGOSCOPY WITH DILITATION; Surgeon: [name], MD; Location:  [place]; Service: ENT; Laterality: N/A;  . ESOPHAGOSCOPY WITH DILITATION N/A 07/04/2016   Procedure: ESOPHAGOSCOPY WITH DILITATION; Surgeon: [name], MD; Location: [place]; Service: ENT; Laterality: N/A;  . FOREIGN BODY REMOVAL BRONCHIAL  10/02/2011   Procedure: REMOVAL FOREIGN BODY BRONCHIAL; Surgeon: Ruby Cola, MD; Location: [place]; Service: ENT; Laterality: N/A;  . LARYNGECTOMY    . Porta cath removal    . PORTACATH PLACEMENT  09/17/10   Tip in cavoatrial junction  . STOMAPLASTY N/A 10/21/2016   Procedure: Zola Button; Surgeon: [name], MD; Location: [place]; Service: ENT; Laterality: N/A;  . TRACHEAL DILITATION  07/16/2011   Procedure: TRACHEAL DILITATION; Surgeon: Beckie Salts, MD; Location: [place]; Service: ENT; Laterality: N/A; dilation of tracheal stoma and replacement of stoma tube  . TUBAL LIGATION  1982         Family History  Problem Relation Age of Onset  . Heart disease Mother   . Heart disease Father   . Heart disease Sister   . Breast cancer Neg Hx    Social History:reports that she quit smoking about 6 years ago. Her smoking use included cigarettes. She has a 40.00 pack-year smoking history. she has never used smokeless tobacco. She reports that she drinks alcohol. She reports that she does not use drugs.  Allergies:     Allergies  Allergen Reactions  . Lisinopril Cough    No medications prior to admission.    LabResultsLast48Hours  No results found for this or any previous visit (  from the past 48 hour(s)).   ImagingResults(Last48hours)  No results found.    ROS: otherwise negative  There were no vitals taken for this visit.  PHYSICAL EXAM: Overall appearance: Healthy appearing, in no distress Head: Normocephalic, atraumatic. Ears: External auditory canals are clear; tympanic membranes are intact and the middle ears are free of any effusion. Nose: External nose is healthy in  appearance. Internal nasal exam free of any lesions or obstruction. Oral Cavity/pharynx: There are no mucosal lesions or masses identified. Neuro: No identifiable neurologic deficits. Neck: No palpable neck masses.Stoma is clear. TEP functioning.  Studies Reviewed: none    Assessment/Plan Esophageal stricture following laryngectomy and radiation years ago. Proceed with esophagoscopy and dilatation.

## 2018-04-08 ENCOUNTER — Encounter: Payer: Self-pay | Admitting: Family Medicine

## 2018-04-08 ENCOUNTER — Ambulatory Visit (INDEPENDENT_AMBULATORY_CARE_PROVIDER_SITE_OTHER): Payer: Medicaid Other | Admitting: Family Medicine

## 2018-04-08 ENCOUNTER — Other Ambulatory Visit: Payer: Self-pay

## 2018-04-08 VITALS — BP 110/72 | HR 91 | Temp 97.9°F | Wt 205.0 lb

## 2018-04-08 DIAGNOSIS — E039 Hypothyroidism, unspecified: Secondary | ICD-10-CM | POA: Diagnosis not present

## 2018-04-08 DIAGNOSIS — F325 Major depressive disorder, single episode, in full remission: Secondary | ICD-10-CM | POA: Diagnosis not present

## 2018-04-08 DIAGNOSIS — M170 Bilateral primary osteoarthritis of knee: Secondary | ICD-10-CM | POA: Diagnosis not present

## 2018-04-08 DIAGNOSIS — Z1239 Encounter for other screening for malignant neoplasm of breast: Secondary | ICD-10-CM | POA: Diagnosis not present

## 2018-04-08 DIAGNOSIS — F329 Major depressive disorder, single episode, unspecified: Secondary | ICD-10-CM | POA: Insufficient documentation

## 2018-04-08 MED ORDER — BUPROPION HCL ER (XL) 150 MG PO TB24: 150.0000 mg | ORAL_TABLET | Freq: Every day | ORAL | 3 refills | Status: DC

## 2018-04-08 NOTE — Patient Instructions (Signed)
It was good to see you today!  We are checking some labs today. If results require attention, either myself or my nurse will get in touch with you. If everything is normal, you will get a letter in the mail or a message in My Chart. Please give [place] a call if you do not hear from [place] after 2 weeks.   Someone will call you about the sports medicine appointment.    Please bring all of your medications with you to each visit.   Sign up for My Chart to have easy access to your labs results, and communication with your primary care physician.  Feel free to call with any questions or concerns at any time, at [phone].   Take care,  Dr. Bufford Lope, [place]

## 2018-04-08 NOTE — Progress Notes (Signed)
    Subjective:  Deborah [name] is a 62 y.o. female who presents to the [facility] today for hypothyroidism follow-up.  HPI:  Hypothyroidism States that she has been taking her thyroid medication regularly, no missed doses.  She has all of her medications with her today.  She states that she has always been on generic levothyroxine for years.  She denies any hair skin or nail changes.  She denies heat or cold intolerance.  She denies any unexplained weight gain. She states that she feels fine and does not understand why her thyroid medication keeps being increased.  Depression She states her mood is stable she is doing well on Wellbutrin, compliant all the time, tolerating well. Denies SI or HI.  She needs refills today  Osteoarthritis She wonders if there are better medications for her to be on as she has recently been taken off ibuprofen due to kidney dysfunction.  States that topical medications as well as scheduled Tylenol not been helpful for her.  She would like to go see sports medicine or rheumatology to discuss when she is seen regarding Humira.    ROS: Per HPI  Social Hx: She reports that she quit smoking about 7 years ago. Her smoking use included cigarettes. She has a 40.00 pack-year smoking history. She has never used smokeless tobacco. She reports that she drinks alcohol. She reports that she does not use drugs.   Objective:  Physical Exam: BP 110/72   Pulse 91   Temp 97.9 F (36.6 C) (Oral)   Wt 205 lb (93 kg)   SpO2 97%   BMI 32.11 kg/m   Gen: NAD, resting comfortably Neck: tracheostomy in place. Thyroid normal to palpation without goiter or nodules. CV: RRR with no murmurs appreciated Pulm: NWOB, CTAB with no crackles, wheezes, or rhonchi GI: Normal bowel sounds present. Soft, Nontender, Nondistended. MSK: no edema, cyanosis, or clubbing noted Skin: warm, dry Neuro: grossly normal, moves all extremities Psych: Normal affect and thought  content   Assessment/Plan:  Hypothyroidism Patient with worsening TSH despite increasing doses of levothyroxine.  She has brought all her medications with her and based on my assessment of her pill counts she has been compliant on her medication.  Uncertain etiology as this would normally indicate medication compliance or change from brand to generic levothyroxine.  Thankfully she feels well.  Recheck TSH with T3 and T4 today.  If abnormal will refer to endocrinology.  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  Refilled Wellbutrin  Osteoarthritis of knees, bilateral Offered to discuss scheduled Tylenol as well as topical medications to help manage her knee pain.  She would like to see a specialist, specifically sports medicine referral placed today   Bufford Lope, DO PGY-3, [facility] 04/08/2018 10:53 AM

## 2018-04-08 NOTE — Assessment & Plan Note (Signed)
Stable and doing well.  Refilled Wellbutrin

## 2018-04-08 NOTE — Assessment & Plan Note (Signed)
Patient with worsening TSH despite increasing doses of levothyroxine.  She has brought all her medications with her and based on my assessment of her pill counts she has been compliant on her medication.  Uncertain etiology as this would normally indicate medication compliance or change from brand to generic levothyroxine.  Thankfully she feels well.  Recheck TSH with T3 and T4 today.  If abnormal will refer to endocrinology.

## 2018-04-08 NOTE — Assessment & Plan Note (Signed)
Offered to discuss scheduled Tylenol as well as topical medications to help manage her knee pain.  She would like to see a specialist, specifically sports medicine referral placed today

## 2018-04-09 ENCOUNTER — Encounter: Payer: Self-pay | Admitting: Family Medicine

## 2018-04-09 LAB — T3, FREE: T3, Free: 4.3 pg/mL (ref 2.0–4.4)

## 2018-04-09 LAB — TSH: TSH: 1.28 u[IU]/mL (ref 0.450–4.500)

## 2018-04-09 LAB — T4, FREE: FREE T4: 2.65 ng/dL — AB (ref 0.82–1.77)

## 2018-04-11 ENCOUNTER — Other Ambulatory Visit: Payer: Self-pay | Admitting: Internal Medicine

## 2018-04-12 ENCOUNTER — Other Ambulatory Visit: Payer: Self-pay

## 2018-04-12 ENCOUNTER — Encounter (HOSPITAL_BASED_OUTPATIENT_CLINIC_OR_DEPARTMENT_OTHER): Payer: Self-pay | Admitting: *Deleted

## 2018-04-14 ENCOUNTER — Ambulatory Visit: Payer: Medicaid Other | Admitting: Sports Medicine

## 2018-04-16 ENCOUNTER — Other Ambulatory Visit: Payer: Self-pay

## 2018-04-16 ENCOUNTER — Encounter (HOSPITAL_COMMUNITY): Payer: Self-pay | Admitting: *Deleted

## 2018-04-16 NOTE — Progress Notes (Signed)
Anesthesia Chart Review: SAME DAY WORK-UP  Case:  [id number] Date/Time:  04/19/18 0800   Procedure:  ESOPHAGOSCOPY WITH DILITATION (N/A )   Anesthesia type:  General   Pre-op diagnosis:  Airway Obstruction   Location:  [place] OR ROOM 03 / [place] [facility]   Surgeon:  [name], MD      DISCUSSION: Patient is a 62 year old female scheduled for the above procedure. Case was moved to [place] for [facility].  History includes HTN, heart murmur, asthma, COPD, hypothyroidism, anemia,laryngeal cancer (s/p laryngectomy and radiation, tracheostomy dependent, s/p stomaplasty 10/21/16, TEP managed at [facility]; s/p esophagoscopy with diltation 12/11/17; s/p right bronchoscopy for retrieval of silastic foreign body and replacement of tracheoesophageal fistula prothesis 01/08/18), GERD.Former smoker quit 2012). Last BMI 32.0.   If no acute changes and I would anticipate that she could proceed as planned.   PROVIDERS: Bufford Lope, DO is PCP at the [facility].  [name], MD as Attending Physician (Otolaryngology) Heath Lark, MD as Consulting Physician (Hematology and Oncology) Christinia Gully, MD is pulmonologist. Last visit 09/20/17. He felt patient's DOE was upper airway related, as no pulmonary findings to explain symptoms.  [name], MD is cardiologist. Seen by [name], PA 10/01/17 for dyspnea evaluation. PRN f/u recommended after testing (see below).   LABS: Labs scheduled for day of surgery.   IMAGES: CXR 01/08/18 (in the setting of aspiration of dislodged transesophageal prosthesis; now s/p retrieval): IMPRESSION: Volume loss on the left with diffuse infiltrative change. This may be related to some central mucous plugging. Hyperinflation of the right lung is noted.   OTHER: Walk Tests: 08/21/17 Walked RA x one lap @ 185 stopped due to Sob with nl sats / rapid pace  09/18/17 Walked RA 2 laps @ 185 ft each stopped due to Sob / no desats / slow pace   EKG:  01/08/18: SR, LVH, T wave abnormality, consider lateral ischemia. No significant change since last tracing.   CV: Nuclear stress test 10/07/17:  Nuclear stress EF: 61%.  There was no ST segment deviation noted during stress.  Defect 1: There is a medium defect of moderate severity present in the mid anteroseptal and apex location.  The study is normal.  This is a low risk study.  The left ventricular ejection fraction is normal (55-65%). Normal stress nuclear study with breast attenuation but no ischemia; EF 61 with normal wall motion.  Echo 08/31/17: Study Conclusions - Left ventricle: The cavity size was mildly reduced. Wall thickness was increased in a pattern of moderate LVH. Systolic function was vigorous. The estimated ejection fraction was in the range of 65% to 70%. Wall motion was normal; there were no regional wall motion abnormalities. Features are consistent with a pseudonormal left ventricular filling pattern, with concomitant abnormal relaxation and increased filling pressure (grade 2 diastolic dysfunction). - Aortic valve: There was mild regurgitation. - Pulmonary arteries: Systolic pressure was mildly increased. PA peak pressure: 35 mm Hg (S).   Past Medical History:  Diagnosis Date  . Anemia   . Arthritis    in knees  . Asthma   . Bronchitis   . COPD (chronic obstructive pulmonary disease) ([place])   . Diverticulosis 11/15/2012    noted on screening colonoscopy   . Dyspnea    with COPD exerbation  . Esophageal stricture   . Esophageal stricture   . Former smoker 03/19/2011  . GERD (gastroesophageal reflux disease)   . Heart murmur  asymptomatic   . History of laryngectomy   . 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 Hypertension   . 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 Internal hemorrhoid 11/15/2012    small, noted on screening colonoscopy   . 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  . Leukocytopenia   . Nausea alone 07/28/2013  . Neck pain 01/21/2012  . Normal MRI 07/14/11   negative for mestasis   . Pneumonia 2012  . Sciatica   . Seizures ([place])    07/24/11 off Effexor w/o seizure  . Sepsis ([place]) 08/04/12  . Sinusitis, chronic 07/20/2011  . Sinusitis, chronic 07/20/2011   Bilateral maxillary, identified on MRI of head 07/14/11.    . Tracheostomy dependent [facility])     Past Surgical History:  Procedure Laterality Date  . COLONOSCOPY N/A 11/15/2012   Procedure: COLONOSCOPY;  Surgeon: Lafayette Dragon, MD;  Location: [facility];  Service: Endoscopy;  Laterality: N/A;  . DENTAL RESTORATION/EXTRACTION WITH X-RAY    . ESOPHAGOSCOPY  06/21/2012   Procedure: ESOPHAGOSCOPY;  Surgeon: [name], MD;  Location: [place];  Service: ENT;  Laterality: N/A;  . ESOPHAGOSCOPY WITH DILITATION N/A 09/21/2014   Procedure: ESOPHAGOSCOPY WITH DILITATION;  Surgeon: [name], MD;  Location: [place];  Service: ENT;  Laterality: N/A;  . ESOPHAGOSCOPY WITH DILITATION N/A 07/04/2016   Procedure: ESOPHAGOSCOPY WITH DILITATION;  Surgeon: [name], MD;  Location: [place];  Service: ENT;  Laterality: N/A;  . ESOPHAGOSCOPY WITH DILITATION N/A 12/01/2017   Procedure: ESOPHAGOSCOPY WITH DILITATION;  Surgeon: [name], MD;  Location: [place];  Service: ENT;  Laterality: N/A;  . FLEXIBLE BRONCHOSCOPY  01/08/2018      . FOREIGN BODY REMOVAL BRONCHIAL  10/02/2011   Procedure: REMOVAL FOREIGN BODY BRONCHIAL;  Surgeon: Ruby Cola, MD;  Location: [place];  Service: ENT;  Laterality: N/A;  . FOREIGN BODY REMOVAL BRONCHIAL N/A 01/08/2018   Procedure: REMOVAL FOREIGN BODY BRONCHIAL;  Surgeon: Jodi Marble, MD;  Location: [facility];  Service: ENT;  Laterality: N/A;  . LARYNGECTOMY    . Porta cath removal    . PORTACATH PLACEMENT  09/17/10   Tip in cavoatrial junction  . STOMAPLASTY N/A 10/21/2016   Procedure: [place] [facility];  Surgeon: [name], MD;  Location: [place];  Service: ENT;  Laterality: N/A;  . TRACHEAL DILITATION  07/16/2011   Procedure: TRACHEAL DILITATION;  Surgeon: Beckie Salts, MD;  Location: [place];  Service: ENT;  Laterality: N/A;  dilation of tracheal stoma and replacement of stoma tube  . TUBAL LIGATION  1982    MEDICATIONS: No current facility-administered medications for this encounter.    [name] albuterol (PROVENTIL HFA;VENTOLIN HFA) 108 (90 Base) MCG/ACT inhaler  . amLODipine (NORVASC) 10 MG tablet  . buPROPion (WELLBUTRIN XL) 150 MG 24 hr tablet  . chlorpheniramine (CHLOR-TRIMETON) 4 MG tablet  . levothyroxine (SYNTHROID, LEVOTHROID) 200 MCG tablet  . MELATONIN PO  . meloxicam (MOBIC) 7.5 MG tablet  . metoprolol tartrate (LOPRESSOR) 50 MG tablet  . mometasone-formoterol (DULERA) 200-5 MCG/ACT AERO  . predniSONE (DELTASONE) 50 MG tablet  . TRANSDERM-SCOP, 1.5 MG, 1 MG/3DAYS  . Misc. Devices (WRIST BRACE) MISC  Per office note, prednisone was written as a 5 day course for arthritic pain on 03/23/18.    George Hugh [facility] Short Stay Center/Anesthesiology Phone [phone] 04/16/2018 3:42 PM

## 2018-04-16 NOTE — Progress Notes (Signed)
Pt denies any acute cardiopulmonary issues.Pt denies being under the care of a cardiologist. Pt denies having a cardiac cath. Pt made aware to stop taking vitamins, fish oil, Melatonin and herbal medications. Do not take any NSAIDs ie: Ibuprofen, Advil, Naproxen (Aleve), Mobic, BC and Goody Powder. Pt verbalized understanding of all pre-op instructions. Anesthesia made aware of pt history.

## 2018-04-16 NOTE — Progress Notes (Signed)
History reviewed with Dr Lissa Hoard.  Per Dr Lissa Hoard, patient is not a candidate for [facility] due to her ASA classification as IV.  Tracey with Dr Janeice Robinson office made aware and she will reschedule and notify patient.

## 2018-04-18 NOTE — Anesthesia Preprocedure Evaluation (Addendum)
Anesthesia Evaluation  Patient identified by MRN, date of birth, ID band Patient awake    Reviewed: Allergy & Precautions, NPO status , Patient's Chart, lab work & pertinent test results  History of Anesthesia Complications Negative for: history of anesthetic complications  Airway       Comment: Tracheostomy (s/p laryngectomy) Dental   Pulmonary COPD,  COPD inhaler, former smoker,  Hx of laryngeal ca s/p laryngectomy, tracheostomy   breath sounds clear to auscultation       Cardiovascular hypertension, Pt. on medications and Pt. on home beta blockers  Rhythm:Regular Rate:Normal     Neuro/Psych Depression negative neurological ROS     GI/Hepatic Neg liver ROS, GERD  ,  Endo/Other  Hypothyroidism   Renal/GU negative Renal ROS     Musculoskeletal  (+) Arthritis , Osteoarthritis,    Abdominal   Peds  Hematology negative hematology ROS (+)   Anesthesia Other Findings Day of surgery medications reviewed with the patient.  Reproductive/Obstetrics                           Anesthesia Physical Anesthesia Plan  ASA: III  Anesthesia Plan: General   Post-op Pain Management:    Induction: Intravenous  PONV Risk Score and Plan: 3 and Treatment may vary due to age or medical condition, Dexamethasone, Ondansetron and Midazolam  Airway Management Planned: Tracheostomy  Additional Equipment:   Intra-op Plan:   Post-operative Plan: Extubation in OR  Informed Consent: I have reviewed the patients History and Physical, chart, labs and discussed the procedure including the risks, benefits and alternatives for the proposed anesthesia with the patient or authorized representative who has indicated his/her understanding and acceptance.     Plan Discussed with: CRNA  Anesthesia Plan Comments:        Anesthesia Quick Evaluation

## 2018-04-19 ENCOUNTER — Other Ambulatory Visit: Payer: Self-pay

## 2018-04-19 ENCOUNTER — Encounter (HOSPITAL_COMMUNITY): Payer: Self-pay

## 2018-04-19 ENCOUNTER — Ambulatory Visit (HOSPITAL_COMMUNITY)
Admission: RE | Admit: 2018-04-19 | Discharge: 2018-04-19 | Disposition: A | Discharge status: Home / Self Care | Payer: Medicaid Other | Source: Ambulatory Visit | Attending: Otolaryngology | Admitting: Otolaryngology

## 2018-04-19 ENCOUNTER — Encounter (HOSPITAL_COMMUNITY)
Admission: RE | Disposition: A | Discharge status: Home / Self Care | Payer: Self-pay | Source: Ambulatory Visit | Attending: Otolaryngology

## 2018-04-19 ENCOUNTER — Ambulatory Visit (HOSPITAL_COMMUNITY): Payer: Medicaid Other | Admitting: Anesthesiology

## 2018-04-19 DIAGNOSIS — J449 Chronic obstructive pulmonary disease, unspecified: Secondary | ICD-10-CM | POA: Diagnosis not present

## 2018-04-19 DIAGNOSIS — Z87891 Personal history of nicotine dependence: Secondary | ICD-10-CM | POA: Diagnosis not present

## 2018-04-19 DIAGNOSIS — I10 Essential (primary) hypertension: Secondary | ICD-10-CM | POA: Insufficient documentation

## 2018-04-19 DIAGNOSIS — K219 Gastro-esophageal reflux disease without esophagitis: Secondary | ICD-10-CM | POA: Diagnosis not present

## 2018-04-19 DIAGNOSIS — Z8521 Personal history of malignant neoplasm of larynx: Secondary | ICD-10-CM | POA: Insufficient documentation

## 2018-04-19 DIAGNOSIS — Z930 Tracheostomy status: Secondary | ICD-10-CM | POA: Diagnosis not present

## 2018-04-19 DIAGNOSIS — E039 Hypothyroidism, unspecified: Secondary | ICD-10-CM | POA: Insufficient documentation

## 2018-04-19 DIAGNOSIS — K222 Esophageal obstruction: Secondary | ICD-10-CM | POA: Diagnosis not present

## 2018-04-19 DIAGNOSIS — Z79899 Other long term (current) drug therapy: Secondary | ICD-10-CM | POA: Diagnosis not present

## 2018-04-19 DIAGNOSIS — Z923 Personal history of irradiation: Secondary | ICD-10-CM | POA: Diagnosis not present

## 2018-04-19 HISTORY — PX: ESOPHAGOSCOPY WITH DILITATION: SHX5618

## 2018-04-19 LAB — BASIC METABOLIC PANEL
ANION GAP: 4 — AB (ref 5–15)
BUN: 12 mg/dL (ref 6–20)
CALCIUM: 8.7 mg/dL — AB (ref 8.9–10.3)
CO2: 22 mmol/L (ref 22–32)
Chloride: 113 mmol/L — ABNORMAL HIGH (ref 98–111)
Creatinine, Ser: 0.83 mg/dL (ref 0.44–1.00)
GFR calc Af Amer: >60 mL/min (ref >60)
Glucose, Bld: 105 mg/dL — ABNORMAL HIGH (ref 70–99)
POTASSIUM: 3.8 mmol/L (ref 3.5–5.1)
Sodium: 139 mmol/L (ref 135–145)

## 2018-04-19 LAB — CBC
HCT: 34.9 % — ABNORMAL LOW (ref 36.0–46.0)
HEMOGLOBIN: 10.6 g/dL — AB (ref 12.0–15.0)
MCH: 29.4 pg (ref 26.0–34.0)
MCHC: 30.4 g/dL (ref 30.0–36.0)
MCV: 96.7 fL (ref 80.0–100.0)
Platelets: 312 10*3/uL (ref 150–400)
RBC: 3.61 MIL/uL — ABNORMAL LOW (ref 3.87–5.11)
RDW: 13 % (ref 11.5–15.5)
WBC: 3.9 10*3/uL — AB (ref 4.0–10.5)
nRBC: 0 % (ref 0.0–0.2)

## 2018-04-19 SURGERY — ESOPHAGOSCOPY, WITH DILATION
Anesthesia: General

## 2018-04-19 MED ORDER — SUCCINYLCHOLINE CHLORIDE 200 MG/10ML IV SOSY
PREFILLED_SYRINGE | INTRAVENOUS | Status: AC
  Filled 2018-04-19: qty 10

## 2018-04-19 MED ORDER — LACTATED RINGERS IV SOLN
INTRAVENOUS | Status: DC
  Administered 2018-04-19 (×2): via INTRAVENOUS

## 2018-04-19 MED ORDER — MIDAZOLAM HCL 2 MG/2ML IJ SOLN
INTRAMUSCULAR | Status: AC
  Filled 2018-04-19: qty 2

## 2018-04-19 MED ORDER — PHENYLEPHRINE 40 MCG/ML (10ML) SYRINGE FOR IV PUSH (FOR BLOOD PRESSURE SUPPORT)
PREFILLED_SYRINGE | INTRAVENOUS | Status: AC
  Filled 2018-04-19: qty 10

## 2018-04-19 MED ORDER — ROCURONIUM BROMIDE 50 MG/5ML IV SOSY
PREFILLED_SYRINGE | INTRAVENOUS | Status: AC
  Filled 2018-04-19: qty 5

## 2018-04-19 MED ORDER — PROPOFOL 10 MG/ML IV BOLUS
INTRAVENOUS | Status: DC | PRN
  Administered 2018-04-19: 30 mg via INTRAVENOUS
  Administered 2018-04-19: 200 mg via INTRAVENOUS

## 2018-04-19 MED ORDER — GLYCOPYRROLATE PF 0.2 MG/ML IJ SOSY
PREFILLED_SYRINGE | INTRAMUSCULAR | Status: AC
  Filled 2018-04-19: qty 1

## 2018-04-19 MED ORDER — SUCCINYLCHOLINE 20MG/ML (10ML) SYRINGE FOR MEDFUSION PUMP - OPTIME
INTRAMUSCULAR | Status: DC | PRN
  Administered 2018-04-19: 100 mg via INTRAVENOUS

## 2018-04-19 MED ORDER — PROPOFOL 10 MG/ML IV BOLUS
INTRAVENOUS | Status: AC
  Filled 2018-04-19: qty 20

## 2018-04-19 MED ORDER — DEXAMETHASONE SODIUM PHOSPHATE 10 MG/ML IJ SOLN
INTRAMUSCULAR | Status: AC
  Filled 2018-04-19: qty 2

## 2018-04-19 MED ORDER — LIDOCAINE HCL (CARDIAC) PF 100 MG/5ML IV SOSY
PREFILLED_SYRINGE | INTRAVENOUS | Status: DC | PRN
  Administered 2018-04-19: 100 mg via INTRAVENOUS

## 2018-04-19 MED ORDER — SCOPOLAMINE 1 MG/3DAYS TD PT72: 1.0000 | MEDICATED_PATCH | Freq: Once | TRANSDERMAL | Status: DC | PRN

## 2018-04-19 MED ORDER — ONDANSETRON HCL 4 MG/2ML IJ SOLN
INTRAMUSCULAR | Status: AC
  Filled 2018-04-19: qty 2

## 2018-04-19 MED ORDER — MIDAZOLAM HCL 5 MG/5ML IJ SOLN
INTRAMUSCULAR | Status: DC | PRN
  Administered 2018-04-19: 2 mg via INTRAVENOUS

## 2018-04-19 MED ORDER — 0.9 % SODIUM CHLORIDE (POUR BTL) OPTIME
TOPICAL | Status: DC | PRN
  Administered 2018-04-19: 1000 mL

## 2018-04-19 MED ORDER — FENTANYL CITRATE (PF) 100 MCG/2ML IJ SOLN
INTRAMUSCULAR | Status: DC | PRN
  Administered 2018-04-19 (×2): 50 ug via INTRAVENOUS

## 2018-04-19 MED ORDER — EPHEDRINE 5 MG/ML INJ
INTRAVENOUS | Status: AC
  Filled 2018-04-19: qty 10

## 2018-04-19 MED ORDER — FENTANYL CITRATE (PF) 250 MCG/5ML IJ SOLN
INTRAMUSCULAR | Status: AC
  Filled 2018-04-19: qty 5

## 2018-04-19 MED ORDER — LIDOCAINE 2% (20 MG/ML) 5 ML SYRINGE
INTRAMUSCULAR | Status: AC
  Filled 2018-04-19: qty 5

## 2018-04-19 MED ORDER — ONDANSETRON HCL 4 MG/2ML IJ SOLN
INTRAMUSCULAR | Status: DC | PRN
  Administered 2018-04-19: 4 mg via INTRAVENOUS

## 2018-04-19 MED ORDER — FENTANYL CITRATE (PF) 100 MCG/2ML IJ SOLN: 25.0000 ug | INTRAMUSCULAR | Status: DC | PRN

## 2018-04-19 MED ORDER — SUCCINYLCHOLINE CHLORIDE 20 MG/ML IJ SOLN
INTRAMUSCULAR | Status: DC | PRN
  Administered 2018-04-19: 120 mg via INTRAVENOUS

## 2018-04-19 MED ORDER — PROMETHAZINE HCL 25 MG/ML IJ SOLN: 6.2500 mg | INTRAMUSCULAR | Status: DC | PRN

## 2018-04-19 MED ORDER — DEXAMETHASONE SODIUM PHOSPHATE 10 MG/ML IJ SOLN
INTRAMUSCULAR | Status: AC
  Filled 2018-04-19: qty 1

## 2018-04-19 MED ORDER — ACETAMINOPHEN 10 MG/ML IV SOLN: 1000.0000 mg | Freq: Once | INTRAVENOUS | Status: DC | PRN

## 2018-04-19 SURGICAL SUPPLY — 24 items
BALLN PULM 15 16.5 18X75 (BALLOONS)
BALLOON PULM 15 16.5 18X75 (BALLOONS) IMPLANT
BLADE SURG 15 STRL LF DISP TIS (BLADE) IMPLANT
BLADE SURG 15 STRL SS (BLADE)
CANISTER SUCT 3000ML PPV (MISCELLANEOUS) ×2 IMPLANT
CONT SPEC 4OZ CLIKSEAL STRL BL (MISCELLANEOUS) IMPLANT
COVER BACK TABLE 60X90IN (DRAPES) ×2 IMPLANT
COVER WAND RF STERILE (DRAPES) ×2 IMPLANT
CRADLE DONUT ADULT HEAD (MISCELLANEOUS) IMPLANT
DRAPE HALF SHEET 40X57 (DRAPES) ×2 IMPLANT
GAUZE SPONGE 4X4 12PLY STRL (GAUZE/BANDAGES/DRESSINGS) IMPLANT
GLOVE BIO SURGEON STRL SZ7.5 (GLOVE) ×2 IMPLANT
GUARD TEETH (MISCELLANEOUS) IMPLANT
KIT BASIN OR (CUSTOM PROCEDURE TRAY) ×2 IMPLANT
KIT TURNOVER KIT B (KITS) ×2 IMPLANT
NEEDLE PRECISIONGLIDE 27X1.5 (NEEDLE) IMPLANT
NS IRRIG 1000ML POUR BTL (IV SOLUTION) ×2 IMPLANT
PAD ARMBOARD 7.5X6 YLW CONV (MISCELLANEOUS) ×4 IMPLANT
PATTIES SURGICAL .5 X3 (DISPOSABLE) IMPLANT
SOLUTION ANTI FOG 6CC (MISCELLANEOUS) IMPLANT
SURGILUBE 2OZ TUBE FLIPTOP (MISCELLANEOUS) ×2 IMPLANT
TOWEL OR 17X24 6PK STRL BLUE (TOWEL DISPOSABLE) ×4 IMPLANT
TUBE CONNECTING 12X1/4 (SUCTIONS) ×2 IMPLANT
WATER STERILE IRR 1000ML POUR (IV SOLUTION) ×2 IMPLANT

## 2018-04-19 NOTE — Op Note (Signed)
OPERATIVE REPORT  DATE OF SURGERY: 04/19/2018  PATIENT:  Deborah [name],  62 y.o. female  PRE-OPERATIVE DIAGNOSIS: Esophageal stenosis  POST-OPERATIVE DIAGNOSIS: Same  PROCEDURE:  Procedure(s): ESOPHAGOSCOPY WITH DILITATION  SURGEON:  Beckie Salts, MD  ASSISTANTS: None  ANESTHESIA:   General   EBL: 0 ml  DRAINS: None  LOCAL MEDICATIONS USED:  None  SPECIMEN:  none  COUNTS:  Correct  PROCEDURE DETAILS: The patient was taken to the operating room and placed on the operating table in the supine position. Following induction of general endotracheal anesthesia using the tracheal stoma , the table was turned 90 degrees and the patient was draped in the standard fashion.  Patient is edentulous.  A Jako laryngoscope was inserted into the oral cavity used to visualize the pharynx and the esophageal introitus.  The [facility] dilators were used starting at 20 French and serially dilating up to the largest which was 59 [place].  There was slight resistance noted with the larger dilators but they did pass easily and there was no bleeding.  The scope was removed.  The patient was awakened extubated and transferred to recovery in stable condition.    PATIENT DISPOSITION:  To PACU, stable

## 2018-04-19 NOTE — Anesthesia Procedure Notes (Signed)
Procedure Name: Intubation Date/Time: 04/19/2018 8:27 AM Performed by: Shirlyn Goltz, CRNA Pre-anesthesia Checklist: Patient identified, Emergency Drugs available, Suction available and Patient being monitored Patient Re-evaluated:Patient Re-evaluated prior to induction Oxygen Delivery Method: Circle system utilized Preoxygenation: Pre-oxygenation with 100% oxygen Induction Type: IV induction Tube type: Reinforced Tube size: 5.5 mm Number of attempts: 1 Airway Equipment and Method: Tracheostomy Placement Confirmation: positive ETCO2 and breath sounds checked- equal and bilateral Tube secured with: Tape Dental Injury: Teeth and Oropharynx as per pre-operative assessment

## 2018-04-19 NOTE — Transfer of Care (Signed)
Immediate Anesthesia Transfer of Care Note  Patient: Deborah [name]  Procedure(s) Performed: ESOPHAGOSCOPY WITH DILITATION (N/A )  Patient Location: PACU  Anesthesia Type:General  Level of Consciousness: awake, alert , oriented and patient cooperative  Airway & Oxygen Therapy: Patient Spontanous Breathing and Patient connected to nasal cannula oxygen - face mask to trach  Post-op Assessment: Report given to RN and Post -op Vital signs reviewed and stable  Post vital signs: Reviewed and stable  Last Vitals:  Vitals Value Taken Time  BP    Temp    Pulse    Resp    SpO2      Last Pain:  Vitals:   04/19/18 0658  PainSc: 0-No pain      Patients Stated Pain Goal: 3 (47/20/72 1828)  Complications: No apparent anesthesia complications

## 2018-04-19 NOTE — Interval H&P Note (Signed)
History and Physical Interval Note:  04/19/2018 8:08 AM  Deborah [name]  has presented today for surgery, with the diagnosis of Airway Obstruction  The various methods of treatment have been discussed with the patient and family. After consideration of risks, benefits and other options for treatment, the patient has consented to  Procedure(s): ESOPHAGOSCOPY WITH DILITATION (N/A) as a surgical intervention .  The patient's history has been reviewed, patient examined, no change in status, stable for surgery.  I have reviewed the patient's chart and labs.  Questions were answered to the patient's satisfaction.     [name]

## 2018-04-19 NOTE — Progress Notes (Signed)
Patient asking for work note prior to d/c home.  Spoke with [name] and he will have note for patient to pick up from his office on her way home today.  Patient and husband agreeable to this plan.

## 2018-04-19 NOTE — Anesthesia Postprocedure Evaluation (Signed)
Anesthesia Post Note  Patient: Deborah [name]  Procedure(s) Performed: ESOPHAGOSCOPY WITH DILITATION (N/A )     Patient location during evaluation: PACU Anesthesia Type: General Level of consciousness: awake and alert Pain management: pain level controlled Vital Signs Assessment: post-procedure vital signs reviewed and stable Respiratory status: spontaneous breathing, nonlabored ventilation and respiratory function stable Cardiovascular status: blood pressure returned to baseline and stable Postop Assessment: no apparent nausea or vomiting Anesthetic complications: no    Last Vitals:  Vitals:   04/19/18 0932 04/19/18 0940  BP: 140/78 (!) 149/82  Pulse: 70 63  Resp: 17 16  Temp: 36.4 C   SpO2: 100% 100%    Last Pain:  Vitals:   04/19/18 0940  PainSc: 0-No pain                 Brennan Bailey

## 2018-04-20 ENCOUNTER — Encounter (HOSPITAL_COMMUNITY): Payer: Self-pay | Admitting: Otolaryngology

## 2018-04-21 ENCOUNTER — Encounter: Payer: Self-pay | Admitting: Sports Medicine

## 2018-04-21 ENCOUNTER — Ambulatory Visit
Admission: RE | Admit: 2018-04-21 | Discharge: 2018-04-21 | Disposition: A | Discharge status: Home / Self Care | Payer: Medicaid Other | Source: Ambulatory Visit | Attending: Sports Medicine | Admitting: Sports Medicine

## 2018-04-21 ENCOUNTER — Ambulatory Visit (INDEPENDENT_AMBULATORY_CARE_PROVIDER_SITE_OTHER): Payer: Medicaid Other | Admitting: Sports Medicine

## 2018-04-21 VITALS — BP 170/86 | Ht 67.0 in | Wt 202.0 lb

## 2018-04-21 DIAGNOSIS — M170 Bilateral primary osteoarthritis of knee: Secondary | ICD-10-CM

## 2018-04-21 MED ORDER — METHYLPREDNISOLONE ACETATE 80 MG/ML IJ SUSP
80.0000 mg | Freq: Once | INTRAMUSCULAR | Status: AC
  Administered 2018-04-21: 80 mg via INTRA_ARTICULAR

## 2018-04-21 NOTE — Progress Notes (Signed)
Deborah [name] - 62 y.o. female MRN [id number]  Date of birth: [birth year]   Chief complaint: Bilateral knee pain R>L  SUBJECTIVE:    History of present illness: Deborah [name] is a 62 year old female who presents today with a chief complaint of bilateral knee pain right greater than left.  She has had knee pain for over 5 years now.  She states over the past 6 months however her knee pain has significantly worsened.  She denies any injury or trauma to her knees.  She states that climbing stairs and prolonged walking bothers her knees.  Pain is rated 7 out of 10 today and located primarily on the medial joint lines bilaterally of her knees.  She denies any feelings of instability in her knees however she does feel like they are very stiff when she first wakes up in the morning and when she sits for prolonged period of time.  She has noticed that her gait has also changed secondary to pain with ambulation.  She does not use any assistive devices.  She has had a corticosteroid injection of her right knee approximately a year and a half ago with 1 year of relief from this.  She is here today requesting repeat knee injections.  Upon review of her chart, she has not had x-rays since 2016.  At that time, she had at least moderate osteoarthritis of her bilateral knees.  Denies any hip or groin pain.  Denies any low back pain, numbness or tingling of her extremities.   Review of systems:  As stated above   Past medical history: Hypertension, hypothyroidism, carpal tunnel, history of head and neck cancer status post radiation, history of seizures, status post tracheotomy, anemia of chronic disease, depression, obesity, COPD Past surgical history: Status post tracheotomy, several esophageal dilations, status post Port-A-Cath placement for chemotherapy with subsequent removal Past family history: Coronary artery disease Social history: She is a former smoker  Medications: Albuterol, amlodipine, Wellbutrin,  Synthroid, meloxicam, metoprolol, Dulera, prednisone Allergies: Intolerance to lisinopril   OBJECTIVE:  Physical exam: Vital signs are reviewed. BP (!) 170/86   Ht 5\' 7"  (1.702 m)   Wt 202 lb (91.6 kg)   BMI 31.64 kg/m   Gen.: Alert, oriented, trach dependent HEENT: Normocephalic atraumatic, moist mucous membranes, tracheostomy present Respiratory: Normal respirations, no evidence of respiratory distress Cardiac: Right radial pulse +2 Integumentary: No rashes or ecchymoses Neurologic:  Sensation is intact to light touch L4-S1 bilaterally Gait: Ambulates with a slight waddle to her gait with externally rotated legs bilaterally, no assistive devices used  Musculoskeletal: Inspection of her bilateral knees demonstrate no acute abnormality.  For her right knee, she has tenderness to palpation on her medial and lateral joint line.  Mild palpable effusion.  She has full range of motion in knee flexion extension.  Strength testing is 5 out of 5 in knee flexion extension.  Negative Lachman.  Negative anterior posterior drawer.  Discomfort on her medial joint line with McMurray testing.  Negative varus valgus stress of her knee.  Discomfort with patellar grind.  Unable to perform [facility] secondary to feeling like her knee is going to give out.  For her left knee, she has tenderness to palpation on her medial joint line.  No palpable effusion.  She has full range of motion knee flexion extension.  Strength testing is 5 out of 5 knee flexion extension.  Negative Lachman, anterior and posterior drawer.  Negative varus valgus stress.  Negative McMurray's testing.  Unable to perform  Thessaly's.  Negative logroll testing of the hips bilaterally.  Full range of motion of her hips.    ASSESSMENT & PLAN: Osteoarthritis of knees, bilateral INJECTION: Patient was given informed consent, signed copy in the chart. Appropriate time out was taken. Area prepped and draped in usual sterile fashion. 1 cc of  methylprednisolone 80 mg/ml plus  4 cc of 0.5% bupivicaine without epinephrine was injected into the intraarticular space of the left knee using a(n) anteromedial approach. The patient tolerated the procedure well. There were no complications. Post procedure instructions were given.  INJECTION: Patient was given informed consent, signed copy in the chart. Appropriate time out was taken. Area prepped and draped in usual sterile fashion. 1 cc of methylprednisolone 80 mg/ml plus  4 cc of 0.5% bupivicaine without epinephrine was injected into the intraarticular space of the right knee using a(n) anteromedial approach. The patient tolerated the procedure well. There were no complications. Post procedure instructions were given.  J and J hinged knee brace given for the right knee. Activity as tolerated. X-rays of the bilateral knees ordered. F/u in 6 weeks with results. She understands definitive therapy for knee osteoarthritis are total knee arthroplasties. She may take Meloxicam prn for pain and inflammation.   Meds ordered this encounter  Medications  . methylPREDNISolone acetate (DEPO-MEDROL) injection 80 mg  . methylPREDNISolone acetate (DEPO-MEDROL) injection 80 mg      Deborah Laming, DO Sports Medicine Fellow [place]  I was the preceptor for this visit and was available for immediate consultation. Deborah Argue, DO

## 2018-04-21 NOTE — Assessment & Plan Note (Signed)
INJECTION: Patient was given informed consent, signed copy in the chart. Appropriate time out was taken. Area prepped and draped in usual sterile fashion. 1 cc of methylprednisolone 80 mg/ml plus  4 cc of 0.5% bupivicaine without epinephrine was injected into the intraarticular space of the left knee using a(n) anteromedial approach. The patient tolerated the procedure well. There were no complications. Post procedure instructions were given.  INJECTION: Patient was given informed consent, signed copy in the chart. Appropriate time out was taken. Area prepped and draped in usual sterile fashion. 1 cc of methylprednisolone 80 mg/ml plus  4 cc of 0.5% bupivicaine without epinephrine was injected into the intraarticular space of the right knee using a(n) anteromedial approach. The patient tolerated the procedure well. There were no complications. Post procedure instructions were given.  J and J hinged knee brace given for the right knee. Activity as tolerated. X-rays of the bilateral knees ordered. F/u in 6 weeks with results. She understands definitive therapy for knee osteoarthritis are total knee arthroplasties.

## 2018-04-21 NOTE — Patient Instructions (Signed)
It was great to see you today.  Please let me know how your injections worked. I will see you back in 6 weeks for follow up to discuss alternative treatments for osteoarthritis.  X-rays of your knees were ordered. I will call you with results. You may stop your meloxicam if it is not helping. We also gave you a brace today for your right knee for stability.

## 2018-05-06 DIAGNOSIS — I10 Essential (primary) hypertension: Secondary | ICD-10-CM | POA: Diagnosis not present

## 2018-05-06 DIAGNOSIS — Z9002 Acquired absence of larynx: Secondary | ICD-10-CM | POA: Diagnosis not present

## 2018-05-06 DIAGNOSIS — Z79899 Other long term (current) drug therapy: Secondary | ICD-10-CM | POA: Diagnosis not present

## 2018-05-06 DIAGNOSIS — T85638A Leakage of other specified internal prosthetic devices, implants and grafts, initial encounter: Secondary | ICD-10-CM | POA: Diagnosis not present

## 2018-05-19 ENCOUNTER — Ambulatory Visit
Admission: RE | Admit: 2018-05-19 | Discharge: 2018-05-19 | Disposition: A | Discharge status: Home / Self Care | Payer: Medicaid Other | Source: Ambulatory Visit | Attending: Family Medicine | Admitting: Family Medicine

## 2018-05-19 DIAGNOSIS — Z1239 Encounter for other screening for malignant neoplasm of breast: Secondary | ICD-10-CM

## 2018-05-19 DIAGNOSIS — Z1231 Encounter for screening mammogram for malignant neoplasm of breast: Secondary | ICD-10-CM | POA: Diagnosis not present

## 2018-05-21 ENCOUNTER — Other Ambulatory Visit: Payer: Self-pay | Admitting: Family Medicine

## 2018-05-21 DIAGNOSIS — R928 Other abnormal and inconclusive findings on diagnostic imaging of breast: Secondary | ICD-10-CM

## 2018-05-26 ENCOUNTER — Ambulatory Visit
Admission: RE | Admit: 2018-05-26 | Discharge: 2018-05-26 | Disposition: A | Discharge status: Home / Self Care | Payer: Medicaid Other | Source: Ambulatory Visit | Attending: Family Medicine | Admitting: Family Medicine

## 2018-05-26 ENCOUNTER — Other Ambulatory Visit: Payer: Self-pay | Admitting: Family Medicine

## 2018-05-26 DIAGNOSIS — R928 Other abnormal and inconclusive findings on diagnostic imaging of breast: Secondary | ICD-10-CM

## 2018-05-26 DIAGNOSIS — D0511 Intraductal carcinoma in situ of right breast: Secondary | ICD-10-CM | POA: Diagnosis not present

## 2018-05-26 DIAGNOSIS — R921 Mammographic calcification found on diagnostic imaging of breast: Secondary | ICD-10-CM | POA: Diagnosis not present

## 2018-05-27 ENCOUNTER — Ambulatory Visit (INDEPENDENT_AMBULATORY_CARE_PROVIDER_SITE_OTHER): Payer: Medicaid Other | Admitting: Family Medicine

## 2018-05-27 VITALS — BP 125/80 | HR 89 | Temp 98.5°F | Wt 218.0 lb

## 2018-05-27 DIAGNOSIS — B9789 Other viral agents as the cause of diseases classified elsewhere: Secondary | ICD-10-CM | POA: Diagnosis not present

## 2018-05-27 DIAGNOSIS — J069 Acute upper respiratory infection, unspecified: Secondary | ICD-10-CM | POA: Diagnosis not present

## 2018-05-27 MED ORDER — AZITHROMYCIN 250 MG PO TABS: ORAL_TABLET | ORAL | 0 refills | Status: DC

## 2018-05-27 NOTE — Progress Notes (Signed)
   Subjective:   Patient ID: Deborah [name]    DOB: 04-13-1956, 62 y.o. female   MRN: [id number]  CC: cold symptoms   HPI: Deborah [name] is a 62 y.o. female who presents to clinic today for the following issue.  Cough/congestion Symptoms began about 10 days ago.  Runny nose.  No fever, chills.  Has also had body aches and feeling tired.  Drinking lots of soups and liquids but appetite is decreased.  Has had some nausea no vomiting.  No SOB, abdominal pain or diarrhea.  Has tried Mucinex DM but has not helped much.  Has also tried liquid and tablet forms of this.  She has a tracheostomy, is difficult to clear secretions from her cough due to this.   ROS: See HPI for pertinent ROS.  Social: pt is a former smoker, quit 2012.  Medications reviewed. Objective:   BP 125/80   Pulse 89   Temp 98.5 F (36.9 C) (Oral)   Wt 218 lb (98.9 kg)   SpO2 94%   BMI 34.14 kg/m  Vitals and nursing note reviewed.  General: 62 year old female, NAD HEENT: NCAT, EOMI, PERRL, MMM, oropharynx clear, no tonsillar erythema or exudate present Neck: supple CV: RRR no MRG  Lungs: CTAB, normal effort  Abdomen: soft,+bs  Skin: warm, dry, no rash Extremities: warm and well perfused  Assessment & Plan:   Viral URI with cough Likely viral etiology however given severity and duration of symptoms, will prescribe a course of azithromycin. Advised plenty of warm fluids Return precautions discussed  Meds ordered this encounter  Medications  . azithromycin (ZITHROMAX) 250 MG tablet    Sig: Take 2 tablets today, followed by 1 daily until you have completed the course.    Dispense:  6 tablet    Refill:  0   Lovenia Kim, MD [place]

## 2018-05-27 NOTE — Patient Instructions (Signed)
It was very nice meeting you today.  You were seen in clinic for cold symptoms which is most likely due to a viral upper respiratory tract infection.  However, given your duration and severity of symptoms, I am prescribing you a course of antibiotics.   It is also important that you continue to drink plenty of warm fluids and soups to stay hydrated. You may take Tylenol for pain and fever.  If you have any new or worsening symptoms, please make an appointment to be seen by a provider.  I hope you start to feel better soon.  Lovenia Kim MD

## 2018-05-28 DIAGNOSIS — H5203 Hypermetropia, bilateral: Secondary | ICD-10-CM | POA: Diagnosis not present

## 2018-05-28 DIAGNOSIS — H53001 Unspecified amblyopia, right eye: Secondary | ICD-10-CM | POA: Diagnosis not present

## 2018-05-28 DIAGNOSIS — H25813 Combined forms of age-related cataract, bilateral: Secondary | ICD-10-CM | POA: Diagnosis not present

## 2018-05-28 DIAGNOSIS — H524 Presbyopia: Secondary | ICD-10-CM | POA: Diagnosis not present

## 2018-05-28 DIAGNOSIS — H548 Legal blindness, as defined in USA: Secondary | ICD-10-CM | POA: Diagnosis not present

## 2018-05-31 ENCOUNTER — Telehealth: Payer: Self-pay | Admitting: *Deleted

## 2018-05-31 DIAGNOSIS — D0511 Intraductal carcinoma in situ of right breast: Secondary | ICD-10-CM | POA: Insufficient documentation

## 2018-05-31 NOTE — Telephone Encounter (Signed)
Confirmed [facility] for 06/02/18 at 1215 .  Instructions and contact information given.

## 2018-06-01 ENCOUNTER — Encounter: Payer: Self-pay | Admitting: Gastroenterology

## 2018-06-01 NOTE — Progress Notes (Signed)
[facility] NOTE  Patient Care Team: Bufford Lope, DO as PCP - General (Family Medicine) [name], MD as Attending Physician (Otolaryngology) Kyung Rudd, MD (Radiation Oncology) Heath Lark, MD as Consulting Physician (Hematology and Oncology) Rolm Bookbinder, MD as Consulting Physician (General Surgery) Nicholas Lose, MD as Consulting Physician (Hematology and Oncology) Eppie Gibson, MD as Attending Physician (Radiation Oncology)  CHIEF COMPLAINTS/PURPOSE OF CONSULTATION:  Newly diagnosed DCIS of the right breast  HISTORY OF PRESENTING ILLNESS:  Deborah [name] 62 y.o. female is here because of recent diagnosis of DCIS of the right breast. The cancer was not palpable prior to diagnosis. A screening mammogram on 05/19/18 showed calcifications in the right breast. A diagnostic mammogram from 05/26/18 showed indeterminate calcifications in the right breast. 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 She presents to the clinic today alone.   I reviewed her records extensively and collaborated the history with the patient.  SUMMARY OF ONCOLOGIC HISTORY:   Ductal carcinoma in situ (DCIS) of right breast   05/26/2018 Initial Diagnosis    History of laryngeal cancer treated with chemoradiation, screening detected right breast calcifications medial right breast measuring 1.9 cm, biopsy revealed high-grade DCIS with comedonecrosis and calcifications ER 90%, PR 90%, Tis NX stage 0    06/02/2018 Cancer Staging    Staging form: Breast, AJCC 8th Edition - Clinical: Stage 0 (cTis (DCIS), cN0, cM0, ER+, PR+, HER2: Not Assessed) - Signed by Nicholas Lose, MD on 06/02/2018      MEDICAL HISTORY:  Past Medical History:  Diagnosis Date  . Anemia   . Arthritis    in knees  . Asthma   . Bronchitis   . COPD (chronic obstructive pulmonary disease) ([place])   . Diverticulosis 11/15/2012     noted on screening colonoscopy   . Dyspnea    with COPD exerbation  . Esophageal stricture   . Esophageal stricture   . Former smoker 03/19/2011  . GERD (gastroesophageal reflux disease)   . Heart murmur    asymptomatic   . History of laryngectomy   . 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 Hypertension   . 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 Internal hemorrhoid 11/15/2012    small, noted on screening colonoscopy   . 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  . Leukocytopenia   . Nausea alone 07/28/2013  . Neck pain 01/21/2012  . Normal MRI 07/14/11   negative for mestasis   . Pneumonia 2012  . Sciatica   . Seizures ([place])    07/24/11 off Effexor w/o seizure  . Sepsis ([place]) 08/04/12  . Sinusitis, chronic 07/20/2011  . Sinusitis, chronic 07/20/2011   Bilateral maxillary, identified on MRI of head 07/14/11.    . Tracheostomy dependent [facility])     SURGICAL HISTORY: Past Surgical History:  Procedure Laterality Date  . COLONOSCOPY N/A 11/15/2012   Procedure: COLONOSCOPY;  Surgeon: Lafayette Dragon, MD;  Location: [facility];  Service: Endoscopy;  Laterality: N/A;  . DENTAL RESTORATION/EXTRACTION WITH X-RAY    . ESOPHAGOSCOPY  06/21/2012   Procedure: ESOPHAGOSCOPY;  Surgeon: [name], MD;  Location: [place];  Service: ENT;  Laterality: N/A;  . ESOPHAGOSCOPY WITH DILITATION N/A 09/21/2014   Procedure: ESOPHAGOSCOPY WITH DILITATION;  Surgeon: [name], MD;  Location: [place];  Service: ENT;  Laterality: N/A;  . ESOPHAGOSCOPY WITH DILITATION N/A 07/04/2016   Procedure: ESOPHAGOSCOPY WITH DILITATION;  Surgeon: [name], MD;  Location: [place];  Service: ENT;  Laterality: N/A;  . ESOPHAGOSCOPY WITH DILITATION N/A 12/01/2017   Procedure: ESOPHAGOSCOPY WITH DILITATION;  Surgeon: [name], MD;  Location: [place];  Service: ENT;  Laterality: N/A;  . ESOPHAGOSCOPY WITH DILITATION N/A 04/19/2018   Procedure:  ESOPHAGOSCOPY WITH DILITATION;  Surgeon: [name], MD;  Location: [place];  Service: ENT;  Laterality: N/A;  . FLEXIBLE BRONCHOSCOPY  01/08/2018      . FOREIGN BODY REMOVAL BRONCHIAL  10/02/2011   Procedure: REMOVAL FOREIGN BODY BRONCHIAL;  Surgeon: Ruby Cola, MD;  Location: [place];  Service: ENT;  Laterality: N/A;  . FOREIGN BODY REMOVAL BRONCHIAL N/A 01/08/2018   Procedure: REMOVAL FOREIGN BODY BRONCHIAL;  Surgeon: Jodi Marble, MD;  Location: [facility];  Service: ENT;  Laterality: N/A;  . LARYNGECTOMY    . Porta cath removal    . PORTACATH PLACEMENT  09/17/10   Tip in cavoatrial junction  . STOMAPLASTY N/A 10/21/2016   Procedure: Zola Button;  Surgeon: [name], MD;  Location: [place];  Service: ENT;  Laterality: N/A;  . TRACHEAL DILITATION  07/16/2011   Procedure: TRACHEAL DILITATION;  Surgeon: Beckie Salts, MD;  Location: [place];  Service: ENT;  Laterality: N/A;  dilation of tracheal stoma and replacement of stoma tube  . TUBAL LIGATION  1982    SOCIAL HISTORY: Social History   Socioeconomic History  . Marital status: Divorced    Spouse name: Not on file  . Number of children: Not on file  . Years of education: 52  . Highest education level: Not on file  Occupational History  . Occupation: part time  Social Needs  . Financial resource strain: Not on file  . Food insecurity:    Worry: Not on file    Inability: Not on file  . Transportation needs:    Medical: Not on file    Non-medical: Not on file  Tobacco Use  . Smoking status: Former Smoker    Packs/day: 2.00    Years: 20.00    Pack years: 40.00    Types: Cigarettes    Last attempt to quit: 09/17/2010    Years since quitting: 7.7  . Smokeless tobacco: Never Used  Substance and Sexual Activity  . Alcohol use: Yes    Comment: Socially drinks   . Drug use: No    Comment: tried cocaine 1 time 2010 only used 1 time  . Sexual activity: Yes    Birth control/protection: Surgical  Lifestyle  . Physical activity:     Days per week: Not on file    Minutes per session: Not on file  . Stress: Not on file  Relationships  . Social connections:    Talks on phone: Not on file    Gets together: Not on file    Attends religious service: Not on file    Active member of club or organization: Not on file    Attends meetings of clubs or organizations: Not on file    Relationship status: Not on file  . Intimate partner violence:    Fear of current or ex partner: Not on file    Emotionally abused: Not on file    Physically abused: Not on file    Forced sexual activity: Not on file  Other Topics Concern  . Not on file  Social History Narrative   Lives with  boyfriend    FAMILY HISTORY: Family History  Problem Relation Age of Onset  . Heart disease Mother   . Heart disease Father   . Heart disease Sister   . Breast cancer Neg Hx     ALLERGIES:  is allergic to lisinopril.  MEDICATIONS:  Current Outpatient Medications  Medication Sig Dispense Refill  . albuterol (PROVENTIL HFA;VENTOLIN HFA) 108 (90 Base) MCG/ACT inhaler INHALE 2 PUFFS BY MOUTH EVERY 4 HOURS AS NEEDED FOR WHEEZING OR SHORTNESS OF BREATH 2 each 3  . amLODipine (NORVASC) 10 MG tablet TAKE 1 TABLET BY MOUTH ONCE DAILY 90 tablet 3  . buPROPion (WELLBUTRIN XL) 150 MG 24 hr tablet Take 1 tablet (150 mg total) by mouth daily. 90 tablet 3  . chlorpheniramine (CHLOR-TRIMETON) 4 MG tablet Take 4 mg by mouth every 6 (six) hours as needed for allergies.    [name] levothyroxine (SYNTHROID, LEVOTHROID) 200 MCG tablet Take 1 tablet (200 mcg total) by mouth daily before breakfast. 30 tablet 0  . MELATONIN PO Take 3 mg by mouth at bedtime as needed (sleep).     . metoprolol tartrate (LOPRESSOR) 50 MG tablet TAKE 1 TABLET BY MOUTH TWICE DAILY 60 tablet 2  . Misc. Devices (WRIST BRACE) MISC 1 Units by Does not apply route at bedtime. Wear braces on both hands every night. 2 each 0  . mometasone-formoterol (DULERA) 200-5 MCG/ACT AERO Inhale 2 puffs into the lungs  2 (two) times daily. 13 g 2  . predniSONE (DELTASONE) 50 MG tablet Take once daily with food. 5 tablet 0  . TRANSDERM-SCOP, 1.5 MG, 1 MG/3DAYS PLACE 1 PATCH ONTO THE SKIN EVERY 3 DAYS AS NEEDED 10 patch 2   No current facility-administered medications for this visit.     REVIEW OF SYSTEMS:   Constitutional: Denies fevers, chills or abnormal night sweats Eyes: Denies blurriness of vision, double vision or watery eyes Ears, nose, mouth, throat, and face: Tracheostomy Respiratory: Denies cough, dyspnea or wheezes Cardiovascular: Denies palpitation, chest discomfort or lower extremity swelling Gastrointestinal:  Denies nausea, heartburn or change in bowel habits Skin: Denies abnormal skin rashes Lymphatics: Denies new lymphadenopathy or easy bruising Neurological:Denies numbness, tingling or new weaknesses Behavioral/Psych: Mood is stable, no new changes  Breast: Denies any palpable lumps or discharge All other systems were reviewed with the patient and are negative.  PHYSICAL EXAMINATION: ECOG PERFORMANCE STATUS: 1 - Symptomatic but completely ambulatory  Vitals:   06/02/18 1250  BP: (!) 159/86  Pulse: 78  Resp: 18  Temp: 98 F (36.7 C)  SpO2: 98%   Filed Weights   06/02/18 1250  Weight: 221 lb 4.8 oz (100.4 kg)    GENERAL:alert, no distress and comfortable SKIN: skin color, texture, turgor are normal, no rashes or significant lesions EYES: normal, conjunctiva are pink and non-injected, sclera clear OROPHARYNX:no exudate, no erythema and lips, buccal mucosa, and tongue normal  NECK: Tracheostomy LYMPH:  no palpable lymphadenopathy in the cervical, axillary or inguinal LUNGS: clear to auscultation and percussion with normal breathing effort HEART: regular rate & rhythm and no murmurs and no lower extremity edema ABDOMEN:abdomen soft, non-tender and normal bowel sounds Musculoskeletal:no cyanosis of digits and no clubbing  PSYCH: alert & oriented x 3 with fluent  speech NEURO: no focal motor/sensory deficits  LABORATORY DATA:  I have reviewed the data as listed Lab Results  Component Value Date   WBC 5.0 06/02/2018   HGB 11.7 (L) 06/02/2018   HCT 36.0 06/02/2018   MCV 94.7 06/02/2018  PLT 359 06/02/2018   Lab Results  Component Value Date   NA 142 06/02/2018   K 3.6 06/02/2018   CL 108 06/02/2018   CO2 25 06/02/2018    RADIOGRAPHIC STUDIES: I have personally reviewed the radiological reports and agreed with the findings in the report.  ASSESSMENT AND PLAN:  Ductal carcinoma in situ (DCIS) of right breast 05/26/2018: History of laryngeal cancer treated with chemoradiation, screening detected right breast calcifications medial right breast measuring 1.9 cm, biopsy revealed high-grade DCIS with comedonecrosis and calcifications ER 90%, PR 90%, Tis NX stage 0  Pathology review: I discussed with the patient the difference between DCIS and invasive breast cancer. It is considered a precancerous lesion. DCIS is classified as a 0. It is generally detected through mammograms as calcifications. We discussed the significance of grades and its impact on prognosis. We also discussed the importance of ER and PR receptors and their implications to adjuvant treatment options. Prognosis of DCIS dependence on grade, comedo necrosis. It is anticipated that if not treated, 20-30% of DCIS can develop into invasive breast cancer.  Recommendation: 1. Breast conserving surgery 2. Followed by adjuvant radiation therapy 3. Followed by antiestrogen therapy with tamoxifen 5 years  Tamoxifen counseling: We discussed the risks and benefits of tamoxifen. These include but not limited to insomnia, hot flashes, mood changes, vaginal dryness, and weight gain. Although rare, serious side effects including endometrial cancer, risk of blood clots were also discussed. We strongly believe that the benefits far outweigh the risks. Patient understands these risks and consented  to starting treatment. Planned treatment duration is 5 years.  Return to clinic after surgery to discuss the final pathology report and come up with an adjuvant treatment plan.     All questions were answered. The patient knows to call the clinic with any problems, questions or concerns.  Nicholas Lose, MD 06/02/2018   I, Cloyde Reams Dorshimer, am acting as scribe for Nicholas Lose, MD.  I have reviewed the above documentation for accuracy and completeness, and I agree with the above.

## 2018-06-02 ENCOUNTER — Inpatient Hospital Stay: Payer: Medicaid Other

## 2018-06-02 ENCOUNTER — Inpatient Hospital Stay: Payer: Medicaid Other | Attending: Hematology and Oncology | Admitting: Hematology and Oncology

## 2018-06-02 ENCOUNTER — Ambulatory Visit
Admission: RE | Admit: 2018-06-02 | Discharge: 2018-06-02 | Disposition: A | Discharge status: Home / Self Care | Payer: Medicaid Other | Source: Ambulatory Visit | Attending: Radiation Oncology | Admitting: Radiation Oncology

## 2018-06-02 ENCOUNTER — Telehealth: Payer: Self-pay | Admitting: Hematology and Oncology

## 2018-06-02 ENCOUNTER — Other Ambulatory Visit: Payer: Self-pay | Admitting: General Surgery

## 2018-06-02 ENCOUNTER — Encounter: Payer: Self-pay | Admitting: Radiation Oncology

## 2018-06-02 ENCOUNTER — Ambulatory Visit: Payer: Medicaid Other | Admitting: Physical Therapy

## 2018-06-02 DIAGNOSIS — D0511 Intraductal carcinoma in situ of right breast: Secondary | ICD-10-CM

## 2018-06-02 DIAGNOSIS — I10 Essential (primary) hypertension: Secondary | ICD-10-CM | POA: Insufficient documentation

## 2018-06-02 DIAGNOSIS — Z79899 Other long term (current) drug therapy: Secondary | ICD-10-CM

## 2018-06-02 DIAGNOSIS — Z87891 Personal history of nicotine dependence: Secondary | ICD-10-CM | POA: Diagnosis not present

## 2018-06-02 DIAGNOSIS — Z170 Estrogen receptor positive status [ER+]: Secondary | ICD-10-CM | POA: Diagnosis not present

## 2018-06-02 DIAGNOSIS — Z8521 Personal history of malignant neoplasm of larynx: Secondary | ICD-10-CM | POA: Diagnosis not present

## 2018-06-02 DIAGNOSIS — Z923 Personal history of irradiation: Secondary | ICD-10-CM | POA: Diagnosis not present

## 2018-06-02 LAB — CBC WITH DIFFERENTIAL (CANCER CENTER ONLY)
ABS IMMATURE GRANULOCYTES: 0.01 10*3/uL (ref 0.00–0.07)
BASOS ABS: 0.1 10*3/uL (ref 0.0–0.1)
BASOS PCT: 1 %
Eosinophils Absolute: 0.2 10*3/uL (ref 0.0–0.5)
Eosinophils Relative: 5 %
HEMATOCRIT: 36 % (ref 36.0–46.0)
Hemoglobin: 11.7 g/dL — ABNORMAL LOW (ref 12.0–15.0)
IMMATURE GRANULOCYTES: 0 %
LYMPHS ABS: 1.8 10*3/uL (ref 0.7–4.0)
Lymphocytes Relative: 37 %
MCH: 30.8 pg (ref 26.0–34.0)
MCHC: 32.5 g/dL (ref 30.0–36.0)
MCV: 94.7 fL (ref 80.0–100.0)
MONOS PCT: 6 %
Monocytes Absolute: 0.3 10*3/uL (ref 0.1–1.0)
NEUTROS ABS: 2.6 10*3/uL (ref 1.7–7.7)
NEUTROS PCT: 51 %
NRBC: 0 % (ref 0.0–0.2)
PLATELETS: 359 10*3/uL (ref 150–400)
RBC: 3.8 MIL/uL — ABNORMAL LOW (ref 3.87–5.11)
RDW: 13.2 % (ref 11.5–15.5)
WBC Count: 5 10*3/uL (ref 4.0–10.5)

## 2018-06-02 LAB — CMP (CANCER CENTER ONLY)
ALBUMIN: 3.8 g/dL (ref 3.5–5.0)
ALT: 17 U/L (ref 0–44)
AST: 16 U/L (ref 15–41)
Alkaline Phosphatase: 86 U/L (ref 38–126)
Anion gap: 9 (ref 5–15)
BUN: 17 mg/dL (ref 6–20)
CHLORIDE: 108 mmol/L (ref 98–111)
CO2: 25 mmol/L (ref 22–32)
CREATININE: 1.07 mg/dL — AB (ref 0.44–1.00)
Calcium: 9 mg/dL (ref 8.9–10.3)
GFR, EST NON AFRICAN AMERICAN: 56 mL/min — AB (ref >60)
GFR, Est AFR Am: >60 mL/min (ref >60)
Glucose, Bld: 90 mg/dL (ref 70–99)
POTASSIUM: 3.6 mmol/L (ref 3.5–5.1)
Sodium: 142 mmol/L (ref 135–145)
Total Bilirubin: 0.2 mg/dL — ABNORMAL LOW (ref 0.3–1.2)
Total Protein: 7.9 g/dL (ref 6.5–8.1)

## 2018-06-02 NOTE — Assessment & Plan Note (Signed)
05/26/2018: History of laryngeal cancer treated with chemoradiation, screening detected right breast calcifications medial right breast measuring 1.9 cm, biopsy revealed high-grade DCIS with comedonecrosis and calcifications ER 90%, PR 90%, Tis NX stage 0  Pathology review: I discussed with the patient the difference between DCIS and invasive breast cancer. It is considered a precancerous lesion. DCIS is classified as a 0. It is generally detected through mammograms as calcifications. We discussed the significance of grades and its impact on prognosis. We also discussed the importance of ER and PR receptors and their implications to adjuvant treatment options. Prognosis of DCIS dependence on grade, comedo necrosis. It is anticipated that if not treated, 20-30% of DCIS can develop into invasive breast cancer.  Recommendation: 1. Breast conserving surgery 2. Followed by adjuvant radiation therapy 3. Followed by antiestrogen therapy with tamoxifen 5 years  Tamoxifen counseling: We discussed the risks and benefits of tamoxifen. These include but not limited to insomnia, hot flashes, mood changes, vaginal dryness, and weight gain. Although rare, serious side effects including endometrial cancer, risk of blood clots were also discussed. We strongly believe that the benefits far outweigh the risks. Patient understands these risks and consented to starting treatment. Planned treatment duration is 5 years.  Return to clinic after surgery to discuss the final pathology report and come up with an adjuvant treatment plan.

## 2018-06-02 NOTE — Progress Notes (Signed)
Radiation Oncology         [phone] ________________________________  Initial Outpatient Consultation  Name: Deborah [name] MRN: [id number]  Date: 06/02/2018  DOB: 1956-05-02  CC:Bufford Lope, DO  Rolm Bookbinder, MD   REFERRING PHYSICIAN: Rolm Bookbinder, MD  DIAGNOSIS:    ICD-10-CM   1. 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 female who presented with right breast calcifications on the following imaging: Bilateral screening mammogram on the date of 05/19/18.   Diagnostic mammogram on 05/26/18 revealed indeterminate right breast calcifications, spanning 1.9 cm, located superiorly and medially.   Biopsy on date of 05/26/18 showed ductal carcinoma in situ; ER positive, PR positive, high grade; with central necrosis and calcifications.  On review of systems, the patient is positive for glasses, heart murmur, COPD, arthritis in knees, claustrophobia  PREVIOUS RADIATION THERAPY: Yes, post op, with concurrent chemotherapy,  for T4N2  laryngeal cancer, by Dr. Lisbeth Renshaw, several years ago.  PAST MEDICAL HISTORY:  has a past medical history of Anemia, Arthritis, Asthma, Bronchitis, COPD (chronic obstructive pulmonary disease) ([place]), Diverticulosis (11/15/2012 ), Dyspnea, Esophageal stricture, Esophageal stricture, Former smoker (03/19/2011), GERD (gastroesophageal reflux disease), Heart murmur, History of laryngectomy, radiation therapy (09/03/10 to 10/16/2010), Hypertension, Hypothyroid, Internal hemorrhoid (11/15/2012 ), Larynx cancer ([place]) (07/31/2010), Leukocytopenia, Nausea alone (07/28/2013), Neck pain (01/21/2012), Normal MRI (07/14/11), Pneumonia (2012), Sciatica, Seizures ([place]), Sepsis ([place]) (08/04/12), Sinusitis, chronic (07/20/2011), Sinusitis, chronic (07/20/2011), and  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[place]).    PAST SURGICAL HISTORY: Past Surgical History:  Procedure Laterality Date  . COLONOSCOPY N/A 11/15/2012   Procedure: COLONOSCOPY;  Surgeon: Lafayette Dragon, MD;  [facility]: [facility];  Service: Endoscopy;  Laterality: N/A;  . DENTAL RESTORATION/EXTRACTION WITH X-RAY    . ESOPHAGOSCOPY  06/21/2012   Procedure: ESOPHAGOSCOPY;  Surgeon: [name], MD;  Location: [facility];  Service: ENT;  Laterality: N/A;  . ESOPHAGOSCOPY WITH DILITATION N/A 09/21/2014   Procedure: ESOPHAGOSCOPY WITH DILITATION;  Surgeon: [name], MD;  Location: [facility];  Service: ENT;  Laterality: N/A;  . ESOPHAGOSCOPY WITH DILITATION N/A 07/04/2016   Procedure: ESOPHAGOSCOPY WITH DILITATION;  Surgeon: [name], MD;  Location: [facility];  Service: ENT;  Laterality: N/A;  . ESOPHAGOSCOPY WITH DILITATION N/A 12/01/2017   Procedure: ESOPHAGOSCOPY WITH DILITATION;  Surgeon: [name], MD;  Location: [facility];  Service: ENT;  Laterality: N/A;  . ESOPHAGOSCOPY WITH DILITATION N/A 04/19/2018   Procedure: ESOPHAGOSCOPY WITH DILITATION;  Surgeon: [name], MD;  Location: [facility];  Service: ENT;  Laterality: N/A;  . FLEXIBLE BRONCHOSCOPY  01/08/2018      . FOREIGN BODY REMOVAL BRONCHIAL  10/02/2011   Procedure: REMOVAL FOREIGN BODY BRONCHIAL;  Surgeon: Ruby Cola, MD;  Location: [facility];  Service: ENT;  Laterality: N/A;  . FOREIGN BODY REMOVAL BRONCHIAL N/A 01/08/2018   Procedure: REMOVAL FOREIGN BODY BRONCHIAL;  Surgeon: Jodi Marble, MD;  [facility];  Service: ENT;  Laterality: N/A;  . LARYNGECTOMY    . Porta cath removal    . PORTACATH PLACEMENT  09/17/10   Tip in cavoatrial junction  . STOMAPLASTY N/A 10/21/2016   Procedure: Zola Button;  Surgeon: [name], MD;  Location: [facility];  Service: ENT;  Laterality: N/A;  . TRACHEAL DILITATION  07/16/2011   Procedure: TRACHEAL  DILITATION;  Surgeon: Beckie Salts, MD;  Location: [facility];  Service: ENT;  Laterality: N/A;  dilation of  tracheal stoma and replacement of stoma tube  . TUBAL LIGATION  1982    FAMILY HISTORY: family history includes Heart disease in her father, mother, and sister.  SOCIAL HISTORY:  reports that she quit smoking about 7 years ago. Her smoking use included cigarettes. She has a 40.00 pack-year smoking history. She has never used smokeless tobacco. She reports current alcohol use. She reports that she does not use drugs.  ALLERGIES: Lisinopril  MEDICATIONS:  Current Outpatient Medications  Medication Sig Dispense Refill  . albuterol (PROVENTIL HFA;VENTOLIN HFA) 108 (90 Base) MCG/ACT inhaler INHALE 2 PUFFS BY MOUTH EVERY 4 HOURS AS NEEDED FOR WHEEZING OR SHORTNESS OF BREATH 2 each 3  . amLODipine (NORVASC) 10 MG tablet TAKE 1 TABLET BY MOUTH ONCE DAILY 90 tablet 3  . azithromycin (ZITHROMAX) 250 MG tablet Take 2 tablets today, followed by 1 daily until you have completed the course. 6 tablet 0  . buPROPion (WELLBUTRIN XL) 150 MG 24 hr tablet Take 1 tablet (150 mg total) by mouth daily. 90 tablet 3  . chlorpheniramine (CHLOR-TRIMETON) 4 MG tablet Take 4 mg by mouth every 6 (six) hours as needed for allergies.    [name] levothyroxine (SYNTHROID, LEVOTHROID) 200 MCG tablet Take 1 tablet (200 mcg total) by mouth daily before breakfast. 30 tablet 0  . MELATONIN PO Take 3 mg by mouth at bedtime as needed (sleep).     . meloxicam (MOBIC) 7.5 MG tablet TAKE 1 TABLET BY MOUTH ONCE DAILY 30 tablet 0  . metoprolol tartrate (LOPRESSOR) 50 MG tablet TAKE 1 TABLET BY MOUTH TWICE DAILY 60 tablet 2  . Misc. Devices (WRIST BRACE) MISC 1 Units by Does not apply route at bedtime. Wear braces on both hands every night. 2 each 0  . mometasone-formoterol (DULERA) 200-5 MCG/ACT AERO Inhale 2 puffs into the lungs 2 (two) times daily. 13 g 2  . predniSONE (DELTASONE) 50 MG tablet Take once daily with food. 5 tablet 0  . TRANSDERM-SCOP, 1.5 MG, 1 MG/3DAYS PLACE 1 PATCH ONTO THE SKIN EVERY 3 DAYS AS NEEDED 10 patch 2   No  current facility-administered medications for this encounter.     REVIEW OF SYSTEMS: A 10+ POINT REVIEW OF SYSTEMS WAS OBTAINED including neurology, dermatology, psychiatry, cardiac, respiratory, lymph, extremities, GI, GU, Musculoskeletal, constitutional, breasts, reproductive, HEENT.  All pertinent positives are noted in the HPI.  All others are negative.   PHYSICAL EXAM:  Vitals with Age-Percentiles 06/02/2018  Length 620.3 cm  Systolic 559  Diastolic 86  MAP   Pulse 78  Respiration 18  Weight 100.381 kg  BMI 34.65  VISIT REPORT    General: Alert and oriented, in no acute distress HEENT: Head is normocephalic. Extraocular movements are intact.  Dentures in place. No lesions on tongue. Neck: +upper neck lymphedema, thickening under upper scars bilaterally consistent with scar tissue, no palpable cervical or supraclavicular lymphadenopathy. + clean trach site, patent. Heart: Regular in rate and rhythm with no murmurs, rubs, or gallops. Chest: Clear to auscultation bilaterally, with no rhonchi, wheezes, or rales. Abdomen: Soft, nontender, nondistended, with no rigidity or guarding. Extremities: No cyanosis or edema. Lymphatics: see Neck Exam Skin: No concerning lesions. Musculoskeletal: symmetric strength and muscle tone throughout. Neurologic: Cranial nerves II through XII are grossly intact. No obvious focalities. Speech is fluent. Coordination is intact. Psychiatric: Judgment and insight are intact. Affect is  appropriate. Breasts: no palpable masses appreciated in the breasts or axillae bilaterally .  ECOG = 0  0 - Asymptomatic (Fully active, able to carry on all predisease activities without restriction)  1 - Symptomatic but completely ambulatory (Restricted in physically strenuous activity but ambulatory and able to carry out work of a light or sedentary nature. For example, light housework, office work)  2 - Symptomatic, <50% in bed during the day (Ambulatory and capable of  all self care but unable to carry out any work activities. Up and about more than 50% of waking hours)  3 - Symptomatic, >50% in bed, but not bedbound (Capable of only limited self-care, confined to bed or chair 50% or more of waking hours)  4 - Bedbound (Completely disabled. Cannot carry on any self-care. Totally confined to bed or chair)  5 - Death   Eustace Pen MM, Creech RH, Tormey DC, et al. [id number]). "Toxicity and response criteria of the [facility]". [place] Oncol. 5 (6): 649-55   LABORATORY DATA:  Lab Results  Component Value Date   WBC 5.0 06/02/2018   HGB 11.7 (L) 06/02/2018   HCT 36.0 06/02/2018   MCV 94.7 06/02/2018   PLT 359 06/02/2018   CMP     Component Value Date/Time   NA 142 06/02/2018 1208   NA 141 03/23/2018 1105   NA 143 08/20/2015 1200   K 3.6 06/02/2018 1208   K 4.1 08/20/2015 1200   CL 108 06/02/2018 1208   CL 105 11/23/2012 0807   CO2 25 06/02/2018 1208   CO2 25 08/20/2015 1200   GLUCOSE 90 06/02/2018 1208   GLUCOSE 86 08/20/2015 1200   GLUCOSE 83 11/23/2012 0807   BUN 17 06/02/2018 1208   BUN 24 03/23/2018 1105   BUN 21.3 08/20/2015 1200   CREATININE 1.07 (H) 06/02/2018 1208   CREATININE 1.1 08/20/2015 1200   CALCIUM 9.0 06/02/2018 1208   CALCIUM 9.9 08/20/2015 1200   PROT 7.9 06/02/2018 1208   PROT 8.8 (H) 08/20/2015 1200   ALBUMIN 3.8 06/02/2018 1208   ALBUMIN 3.9 08/20/2015 1200   AST 16 06/02/2018 1208   AST 18 08/20/2015 1200   ALT 17 06/02/2018 1208   ALT 12 08/20/2015 1200   ALKPHOS 86 06/02/2018 1208   ALKPHOS 90 08/20/2015 1200   BILITOT 0.2 (L) 06/02/2018 1208   BILITOT 0.34 08/20/2015 1200   GFRNONAA 56 (L) 06/02/2018 1208   GFRAA >60 06/02/2018 1208         RADIOGRAPHY: Mm Digital Diagnostic Unilat R  Addendum Date: 05/26/2018   ADDENDUM REPORT: 05/26/2018 12:31 IMPRESSION: Indeterminate right breast calcifications. Electronically Signed   By: Dorise Bullion III M.D   On: 05/26/2018 12:31    Result Date: 05/26/2018 CLINICAL DATA:  The patient was called back from screening mammography for right breast calcifications. EXAM: DIGITAL DIAGNOSTIC RIGHT MAMMOGRAM WITH CAD COMPARISON:  Previous exam(s). ACR Breast Density Category b: There are scattered areas of fibroglandular density. FINDINGS: The calcifications in the right breast are new compared to previous studies and span 1.9 cm located medially and superiorly. Mammographic images were processed with CAD. IMPRESSION: Indeterminate right breast. RECOMMENDATION: Stereotactic right breast. I have discussed the findings and recommendations with the patient. Results were also provided in writing at the conclusion of the visit. If applicable, a reminder letter will be sent to the patient regarding the next appointment. BI-RADS CATEGORY  4: Suspicious. Electronically Signed: By: Dorise Bullion III M.D On: 05/26/2018 12:00  Mm 3d Screen Breast Bilateral  Result Date: 05/20/2018 CLINICAL DATA:  Screening. EXAM: DIGITAL SCREENING BILATERAL MAMMOGRAM WITH TOMO AND CAD COMPARISON:  Previous exam(s). ACR Breast Density Category b: There are scattered areas of fibroglandular density. FINDINGS: In the right breast, calcifications warrant further evaluation with magnified views. In the left breast, no findings suspicious for malignancy. Images were processed with CAD. IMPRESSION: Further evaluation is suggested for calcifications in the right breast. RECOMMENDATION: Diagnostic mammogram of the right breast. (Code:[id number]) The patient will be contacted regarding the findings, and additional imaging will be scheduled. BI-RADS CATEGORY  0: Incomplete. Need additional imaging evaluation and/or prior mammograms for comparison. Electronically Signed   By: Dorise Bullion III M.D   On: 05/20/2018 08:03   Mm Clip Placement Right  Result Date: 05/26/2018 CLINICAL DATA:  Evaluate biopsy marker EXAM: DIAGNOSTIC RIGHT MAMMOGRAM POST STEREOTACTIC BIOPSY COMPARISON:   Previous exam(s). FINDINGS: Mammographic images were obtained following stereotactic guided biopsy of right breast calcifications. The coil shaped biopsy marker migrated 5.3 cm inferior to the biopsied calcifications IMPRESSION: A coil shaped biopsy marker migrated 5.3 cm inferior to the biopsied calcifications Final Assessment: Post Procedure Mammograms for Marker Placement Electronically Signed   By: Dorise Bullion III M.D   On: 05/26/2018 15:53   Mm Rt Breast Bx W Loc Dev 1st Lesion Image Bx Spec Stereo Guide  Addendum Date: 05/28/2018   ADDENDUM REPORT: 05/28/2018 12:24 ADDENDUM: Pathology revealed HIGH NUCLEAR GRADE DUCTAL CARCINOMA IN SITU WITH CENTRAL NECROSIS AND CALCIFICATIONS of the Right breast, superior medial. This was found to be concordant by Dr. Dorise Bullion. Pathology results were discussed with the patient by telephone. The patient reported doing well after the biopsy with tenderness at the site. Post biopsy instructions and care were reviewed and questions were answered. The patient was encouraged to call [facility] for any additional concerns. The patient was referred to [facility] on June 02, 2018. Pathology results reported by Terie Purser, RN on 05/28/2018. Electronically Signed   By: Dorise Bullion III M.D   On: 05/28/2018 12:24   Result Date: 05/28/2018 CLINICAL DATA:  Biopsy of right breast calcifications. EXAM: RIGHT BREAST STEREOTACTIC CORE NEEDLE BIOPSY COMPARISON:  Previous exams. FINDINGS: The patient and I discussed the procedure of stereotactic-guided biopsy including benefits and alternatives. We discussed the high likelihood of a successful procedure. We discussed the risks of the procedure including infection, bleeding, tissue injury, clip migration, and inadequate sampling. Informed written consent was given. The usual time out protocol was performed  immediately prior to the procedure. Using sterile technique and 1% Lidocaine as local anesthetic, under stereotactic guidance, a 9 gauge vacuum assisted device was used to perform core needle biopsy of calcifications in the upper inner right breast using a superior approach. Specimen radiograph was performed showing calcifications in multiple specimens. Specimens with calcifications are identified for pathology. Lesion quadrant: Upper inner At the conclusion of the procedure, a tissue marker clip was deployed into the biopsy cavity. Follow-up 2-view mammogram was performed and dictated separately. IMPRESSION: Stereotactic-guided biopsy of calcifications in the upper inner right breast. No apparent complications. Electronically Signed: By: Dorise Bullion III M.D On: 05/26/2018 15:15      IMPRESSION/PLAN: Right Breast DCIS  It was a pleasure meeting the patient today. We discussed the risks, benefits, and side effects of radiotherapy. I recommend radiotherapy to the right breast to reduce her risk of locoregional recurrence by  half.  We discussed that radiation would take approximately 4 weeks to complete and that I would give the patient a few weeks to heal following surgery before starting treatment planning.    We spoke about acute effects including skin irritation and fatigue as well as much less common late effects including internal organ injury or irritation. We spoke about the latest technology that is used to minimize the risk of late effects for patients undergoing radiotherapy to the breast or chest wall. No guarantees of treatment were given. The patient is enthusiastic about proceeding with treatment. I look forward to participating in the patient's care.  I will await her referral back to me for postoperative follow-up and eventual CT simulation/treatment planning.  __________________________________________   Eppie Gibson, MD  This document serves as a record of services personally  performed by Eppie Gibson, MD. It was created on her behalf by Rae Lips, a trained medical scribe. The creation of this record is based on the scribe's personal observations and the provider's statements to them. This document has been checked and approved by the attending provider.

## 2018-06-02 NOTE — Telephone Encounter (Signed)
No 12/18 los or referrals.  °

## 2018-06-03 ENCOUNTER — Encounter: Payer: Self-pay | Admitting: General Practice

## 2018-06-03 NOTE — Progress Notes (Signed)
Cavour presented to [facility] to introduce [facility] team/resources, completing distress screen per protocol.  The patient scored a [unspecified] on the Psychosocial Distress Thermometer which indicates [unspecified] distress. Also assessed for distress and other psychosocial needs.   ONCBCN DISTRESS SCREENING 06/03/2018  Screening Type Initial Screening  Referral to support programs Yes    Follow up needed: No. No other needs identified at this time. Please page if immediate needs arise or circumstances change. Thank you.   [place], [place], [facility] Pager [phone] Voicemail [phone]

## 2018-06-04 ENCOUNTER — Other Ambulatory Visit: Payer: Self-pay | Admitting: General Surgery

## 2018-06-04 DIAGNOSIS — D0511 Intraductal carcinoma in situ of right breast: Secondary | ICD-10-CM

## 2018-06-07 ENCOUNTER — Other Ambulatory Visit: Payer: Self-pay | Admitting: *Deleted

## 2018-06-07 ENCOUNTER — Telehealth: Payer: Self-pay | Admitting: Hematology and Oncology

## 2018-06-07 DIAGNOSIS — D0511 Intraductal carcinoma in situ of right breast: Secondary | ICD-10-CM

## 2018-06-07 NOTE — Telephone Encounter (Signed)
Scheduled appt per 12/23 sch message - left message for patient with appt date and time   

## 2018-06-10 ENCOUNTER — Telehealth: Payer: Self-pay | Admitting: *Deleted

## 2018-06-10 NOTE — Telephone Encounter (Signed)
  Oncology Nurse Navigator Documentation  Navigator Location: [facility] (06/10/18 1400)   )Navigator Encounter Type: Telephone;[facility] Follow-up (06/10/18 1400) Telephone: Outgoing Call;Clinic/[facility] Follow-up (06/10/18 1400)     Surgery Date: 07/06/18 (06/10/18 1400)           Treatment Initiated Date: 07/06/18 (06/10/18 1400)                                Time Spent with Patient: 15 (06/10/18 1400)

## 2018-06-17 ENCOUNTER — Other Ambulatory Visit: Payer: Self-pay | Admitting: Physician Assistant

## 2018-06-17 ENCOUNTER — Other Ambulatory Visit: Payer: Self-pay | Admitting: Internal Medicine

## 2018-06-17 DIAGNOSIS — E039 Hypothyroidism, unspecified: Secondary | ICD-10-CM

## 2018-06-17 NOTE — Telephone Encounter (Signed)
[facility] for refills.  She needs to follow-up with her PCP and cardiology if only recommended.

## 2018-06-29 NOTE — Pre-Procedure Instructions (Signed)
Deborah [name] [facility]  06/29/2018      [facility] [place], [place] - [name] [place] [address] Phone: [phone] Fax: [phone]    Your procedure is scheduled on 07/06/2018.  Report to [facility] at 0800 A.M.  Call this number if you have problems the morning of surgery:  [phone]   Remember:  Do not eat after midnight.  You may drink clear liquids until 0700 .  Clear liquids allowed are: Water, Juice (non-citric and without pulp), Carbonated beverages, Clear Tea, Black Coffee only and Gatorade   Please complete your PRE-SURGERY ENSURE that was provided to you by 0700am the morning of surgery.  Please, if able, drink it in one sitting. DO NOT SIP.     Take these medicines the morning of surgery with A SIP OF WATER: Albuterol (Proventil;Ventolin) inhaler - if needed (Bring with you to the hospital) Amlodipine (norvasc) Bupropion (Wellbutrin XL) Levothyroxine (Synthroid, levothroid) Metoprolol tartrate (Lopressor) Mometasone-formoterol (Dulera inhaler Omeprazole (prilosec)  7 days prior to surgery STOP taking any Aspirin (unless otherwise instructed by your surgeon), Aleve, Naproxen, Ibuprofen, Motrin, Advil, Goody's, BC's, all herbal medications, fish oil, and all vitamins.     Do not wear jewelry, make-up or nail polish.  Do not wear lotions, powders, or perfumes, or deodorant.  Do not shave 48 hours prior to surgery.   Do not bring valuables to the hospital.  [facility] is not responsible for any belongings or valuables.  Contacts, eyeglasses, hearing aids, dentures or bridgework may not be worn into surgery.  Leave your suitcase in the car.  After surgery it may be brought to your room.  For patients admitted to the hospital, discharge time will be determined by your treatment team.  Patients discharged the day of surgery will not be allowed to drive home.   Name and phone  number of your driver:    Special instructions:   [facility]- Preparing For Surgery  Before surgery, you can play an important role. Because skin is not sterile, your skin needs to be as free of germs as possible. You can reduce the number of germs on your skin by washing with CHG (chlorahexidine gluconate) Soap before surgery.  CHG is an antiseptic cleaner which kills germs and bonds with the skin to continue killing germs even after washing.    Oral Hygiene is also important to reduce your risk of infection.  Remember - BRUSH YOUR TEETH THE MORNING OF SURGERY WITH YOUR REGULAR TOOTHPASTE  Please do not use if you have an allergy to CHG or antibacterial soaps. If your skin becomes reddened/irritated stop using the CHG.  Do not shave (including legs and underarms) for at least 48 hours prior to first CHG shower. It is OK to shave your face.  Please follow these instructions carefully.   1. Shower the NIGHT BEFORE SURGERY and the MORNING OF SURGERY with CHG.   2. If you chose to wash your hair, wash your hair first as usual with your normal shampoo.  3. After you shampoo, rinse your hair and body thoroughly to remove the shampoo.  4. Use CHG as you would any other liquid soap. You can apply CHG directly to the skin and wash gently with a scrungie or a clean washcloth.   5. Apply the CHG Soap to your body ONLY FROM THE NECK DOWN.  Do not use on open wounds or open sores. Avoid contact with  your eyes, ears, mouth and genitals (private parts). Wash Face and genitals (private parts)  with your normal soap.  6. Wash thoroughly, paying special attention to the area where your surgery will be performed.  7. Thoroughly rinse your body with warm water from the neck down.  8. DO NOT shower/wash with your normal soap after using and rinsing off the CHG Soap.  9. Pat yourself dry with a CLEAN TOWEL.  10. Wear CLEAN PAJAMAS to bed the night before surgery, wear comfortable clothes the morning of  surgery  11. Place CLEAN SHEETS on your bed the night of your first shower and DO NOT SLEEP WITH PETS.    Day of Surgery: Shower as stated above. Do not apply any deodorants/lotions.  Please wear clean clothes to the hospital/surgery center.   Remember to brush your teeth WITH YOUR REGULAR TOOTHPASTE.    Please read over the following fact sheets that you were given.

## 2018-06-30 ENCOUNTER — Encounter: Payer: Self-pay | Admitting: Gastroenterology

## 2018-06-30 ENCOUNTER — Encounter (INDEPENDENT_AMBULATORY_CARE_PROVIDER_SITE_OTHER): Payer: Self-pay

## 2018-06-30 ENCOUNTER — Encounter (HOSPITAL_COMMUNITY)
Admission: RE | Admit: 2018-06-30 | Discharge: 2018-06-30 | Disposition: A | Discharge status: Home / Self Care | Payer: Medicaid Other | Source: Ambulatory Visit | Attending: General Surgery | Admitting: General Surgery

## 2018-06-30 ENCOUNTER — Ambulatory Visit: Payer: Medicaid Other | Admitting: Gastroenterology

## 2018-06-30 ENCOUNTER — Encounter (HOSPITAL_COMMUNITY): Payer: Self-pay

## 2018-06-30 VITALS — BP 146/78 | HR 96 | Ht 66.5 in | Wt 223.1 lb

## 2018-06-30 DIAGNOSIS — K222 Esophageal obstruction: Secondary | ICD-10-CM

## 2018-06-30 DIAGNOSIS — R011 Cardiac murmur, unspecified: Secondary | ICD-10-CM | POA: Diagnosis not present

## 2018-06-30 DIAGNOSIS — E039 Hypothyroidism, unspecified: Secondary | ICD-10-CM | POA: Diagnosis not present

## 2018-06-30 DIAGNOSIS — Z01818 Encounter for other preprocedural examination: Secondary | ICD-10-CM | POA: Insufficient documentation

## 2018-06-30 DIAGNOSIS — Z79899 Other long term (current) drug therapy: Secondary | ICD-10-CM | POA: Insufficient documentation

## 2018-06-30 DIAGNOSIS — R1314 Dysphagia, pharyngoesophageal phase: Secondary | ICD-10-CM

## 2018-06-30 DIAGNOSIS — Z87891 Personal history of nicotine dependence: Secondary | ICD-10-CM | POA: Diagnosis not present

## 2018-06-30 DIAGNOSIS — K219 Gastro-esophageal reflux disease without esophagitis: Secondary | ICD-10-CM | POA: Diagnosis not present

## 2018-06-30 DIAGNOSIS — Z8521 Personal history of malignant neoplasm of larynx: Secondary | ICD-10-CM | POA: Diagnosis not present

## 2018-06-30 DIAGNOSIS — I10 Essential (primary) hypertension: Secondary | ICD-10-CM | POA: Insufficient documentation

## 2018-06-30 DIAGNOSIS — Z8589 Personal history of malignant neoplasm of other organs and systems: Secondary | ICD-10-CM | POA: Diagnosis not present

## 2018-06-30 DIAGNOSIS — Z930 Tracheostomy status: Secondary | ICD-10-CM | POA: Insufficient documentation

## 2018-06-30 DIAGNOSIS — Z853 Personal history of malignant neoplasm of breast: Secondary | ICD-10-CM | POA: Diagnosis not present

## 2018-06-30 DIAGNOSIS — J449 Chronic obstructive pulmonary disease, unspecified: Secondary | ICD-10-CM | POA: Insufficient documentation

## 2018-06-30 DIAGNOSIS — Z79890 Hormone replacement therapy (postmenopausal): Secondary | ICD-10-CM | POA: Insufficient documentation

## 2018-06-30 LAB — CBC
HCT: 37.1 % (ref 36.0–46.0)
Hemoglobin: 11.5 g/dL — ABNORMAL LOW (ref 12.0–15.0)
MCH: 29.3 pg (ref 26.0–34.0)
MCHC: 31 g/dL (ref 30.0–36.0)
MCV: 94.6 fL (ref 80.0–100.0)
Platelets: 376 10*3/uL (ref 150–400)
RBC: 3.92 MIL/uL (ref 3.87–5.11)
RDW: 12.6 % (ref 11.5–15.5)
WBC: 6.6 10*3/uL (ref 4.0–10.5)
nRBC: 0 % (ref 0.0–0.2)

## 2018-06-30 LAB — BASIC METABOLIC PANEL
Anion gap: 12 (ref 5–15)
BUN: 16 mg/dL (ref 6–20)
CO2: 22 mmol/L (ref 22–32)
Calcium: 9.4 mg/dL (ref 8.9–10.3)
Chloride: 106 mmol/L (ref 98–111)
Creatinine, Ser: 0.91 mg/dL (ref 0.44–1.00)
GFR calc Af Amer: >60 mL/min (ref >60)
GFR calc non Af Amer: >60 mL/min (ref >60)
Glucose, Bld: 122 mg/dL — ABNORMAL HIGH (ref 70–99)
Potassium: 3.6 mmol/L (ref 3.5–5.1)
Sodium: 140 mmol/L (ref 135–145)

## 2018-06-30 MED ORDER — CHLORHEXIDINE GLUCONATE CLOTH 2 % EX PADS: 6.0000 | MEDICATED_PAD | Freq: Once | CUTANEOUS | Status: DC

## 2018-06-30 NOTE — Progress Notes (Signed)
PCP -Dr Shawna Orleans   Has not seen cardiac md in over a yr  Chest x-ray - 3/19 EKG - 7/19 Stress Test - 4/19 ECHO - 3/19   ERAS protocol  Discussed with pt     Anesthesia review: consult done by Shelby Dubin  PA                Patient denies shortness of breath, fever, cough and chest pain at PAT appointment   Patient verbalized understanding of instructions that were given to them at the PAT appointment. Patient was also instructed that they will need to review over the PAT instructions again at home before surgery.

## 2018-06-30 NOTE — Progress Notes (Addendum)
Anesthesia PAT Evaluation:   Case:  [id number] Date/Time:  07/06/18 0945   Procedure:  RIGHT BREAST LUMPECTOMY WITH RADIOACTIVE SEED LOCALIZATION (Right Breast)   Anesthesia type:  General   Pre-op diagnosis:  right breast dcis   Location:  [place] OR ROOM 09 / [place] [facility]   Surgeon:  Rolm Bookbinder, MD    Special Needs: Anesthesia consult evaluation preoperative. Patient has trach and tracheoesophageal puncture (TEP) prosthesis which is a small plastic or silicone valve that fits into the opening. The valve keeps food out of the trachea. Patients can can cover their stoma with a finger, and force air into the esophagus through the valve.  Radioactive seed is being placed on 07/05/18.   DISCUSSION: Patient is a 64 year old female scheduled for the above procedure.  History includesDCIS right breast (diagnosed 05/26/18), HTN, heart murmur, asthma, COPD, hypothyroidism, anemia,laryngeal cancer (s/p laryngectomy and radiation, tracheostomy dependent, s/p stomaplasty 10/21/16, TEP [facility]; s/p esophagoscopy with diltation 12/11/17; s/p right bronchoscopy for retrieval of silastic foreign body and replacement of tracheoesophageal fistula prothesis 01/08/18; s/p esophagoscopy with dilitation 04/19/18), GERD, former smoker (quit 2012). BMI is consistent with obesity.   Last anesthesia records: - 04/19/18:   Induction Type: IV induction Tube type: Reinforced Tube size: 5.5 mm Number of attempts: 1 Airway Equipment and Method: Tracheostomy  - 01/08/18: Induction Type: IV induction Tube type: Oral Tube size: 6.5 mm Number of attempts: 1 Airway Equipment and Method: Tracheostomy Placement Confirmation: positive ETCO2 and breath sounds checked- equal and bilateral. ETT to lip (cm): balloon cuff visible at stoma opening.  Patient has a soft flexible tracheostomy tube that she reports size as #8. She can removed this easily. The TEP prosthesis can be visualized looking into her tracheal stoma and is pressed  closely (almost flush) to the posterior tracheal. It appears like a small clearish-white button. She says TEP performed on 07/2010 and TEP prosthesis placed 02/2011. Patient cannot remove the TEP prosthesis (done by speech therapist or ENT) although on three occasions it has been come dislodged--twice with significant coughing and once when a physician tried to re-insert her flexible tracheosotomy tube.   She had a URI in December 2019, but no residual symptoms. She reports some degree of chronic DOE which has been stable. She has had issues with dysphagia with certain foods like hamburger. Unfortunately this did not improve after her recent esophagoscopy with dilation, so she was referred to GI and is having a swallow evaluation on 07/02/18. (UPDATE 07/05/18 9:04 AM: Esophogram/barium swallow study on 07/02/18 showed: IMPRESSION: Cervical esophageal stricture just above the tracheostomy tube as described. The remainder of the esophagus appears normal. GI recommendations are still pending.)  Previous airway for anesthesia via tracheostomy. Patient is familiar as she has had multiple experiences with anesthesia with her trach and TEP prosthesis. Anesthesiologist Roberts Gaudy, MD also evaluated airway at PAT. If no acute changes then it is anticipated that she can proceed as planned.   VS: BP (!) 160/77   Pulse 95   Temp 36.9 C   Resp 20   Ht 5' 6.5" (1.689 m)   Wt 101 kg   BMI 35.39 kg/m  Patient is a pleasant black female in NAD. She is able to talk by placing her finger over her tracheal stoma. Heart RRR. No murmur noted. Lungs clear. Tracheal stoma description as above.    PROVIDERS: Bufford Lope, DO is PCP at the [facility].  - [name],  MD is ENT ([place]). Last visit 05/18/18. Nicholas Lose, MD is HEM-[facility]. Eppie Gibson, MD is RAD-[facility]. Christinia Gully, MD is pulmonologist. Last visit 09/20/17. He felt patient's DOE was upper airway related, as no  pulmonary findings to explain symptoms.  [name], MD is cardiologist. Seen by [name], PA 10/01/17 for dyspnea evaluation. PRN f/u recommended after testing (see below). Wray Kearns, MD is GI. See on 06/30/18.   LABS: Labs reviewed: Acceptable for surgery. (all labs ordered are listed, but only abnormal results are displayed)  Labs Reviewed  CBC - Abnormal; Notable for the following components:      Result Value   Hemoglobin 11.5 (*)    All other components within normal limits  BASIC METABOLIC PANEL - Abnormal; Notable for the following components:   Glucose, Bld 122 (*)    All other components within normal limits    IMAGES: 1V CXR 01/08/18 (in the setting of aspiration of dislodged transesophageal prosthesis; now s/p retrieval): IMPRESSION: Volume loss on the left with diffuse infiltrative change. This may be related to some central mucous plugging. Hyperinflation of the right lung is noted.   OTHER: Walk Tests: 08/21/17 Walked RA x one lap @ 185 stopped due to Sob with nl sats / rapid pace  09/18/17 Walked RA 2 laps @ 185 ft each stopped due to Sob / no desats / slow pace   EKG:01/08/18: SR, LVH, T wave abnormality, consider lateral ischemia. No significant change since last tracing.   CV: Nuclear stress test 10/07/17:  Nuclear stress EF: 61%.  There was no ST segment deviation noted during stress.  Defect 1: There is a medium defect of moderate severity present in the mid anteroseptal and apex location.  The study is normal.  This is a low risk study.  The left ventricular ejection fraction is normal (55-65%). Normal stress nuclear study with breast attenuation but no ischemia; EF 61 with normal wall motion.  Echo 08/31/17: Study Conclusions - Left ventricle: The cavity size was mildly reduced. Wall thickness was increased in a pattern of moderate LVH. Systolic function was vigorous. The estimated ejection  fraction was in the range of 65% to 70%. Wall motion was normal; there were no regional wall motion abnormalities. Features are consistent with a pseudonormal left ventricular filling pattern, with concomitant abnormal relaxation and increased filling pressure (grade 2 diastolic dysfunction). - Aortic valve: There was mild regurgitation. - Pulmonary arteries: Systolic pressure was mildly increased. PA peak pressure: 35 mm Hg (S).   Past Medical History:  Diagnosis Date  . Anemia   . Arthritis    in knees  . Asthma   . Breast cancer ([place])   . Bronchitis   . COPD (chronic obstructive pulmonary disease) ([place])   . Diverticulosis 11/15/2012    noted on screening colonoscopy   . Dyspnea    with COPD exerbation  . Esophageal stricture   . Former smoker 03/19/2011  . GERD (gastroesophageal reflux disease)   . Heart murmur    asymptomatic   . History of laryngectomy   . 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 Hypertension   . 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 Internal hemorrhoid 11/15/2012    small, noted on screening colonoscopy   . 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  . Leukocytopenia   . Nausea alone 07/28/2013  . Neck pain 01/21/2012  .  Normal MRI 07/14/11   negative for mestasis   . Pneumonia 2012  . Sciatica   . Seizures ([place])    07/24/11 off Effexor w/o seizure  . Sepsis ([place]) 08/04/12  . Sinusitis, chronic 07/20/2011   Bilateral maxillary, identified on MRI of head 07/14/11.    . Tracheostomy dependent [facility])     Past Surgical History:  Procedure Laterality Date  . COLONOSCOPY N/A 11/15/2012   Procedure: COLONOSCOPY;  Surgeon: Lafayette Dragon, MD;  Location: [facility];  Service: Endoscopy;  Laterality: N/A;  . DENTAL RESTORATION/EXTRACTION WITH X-RAY    . ESOPHAGOSCOPY  06/21/2012   Procedure: ESOPHAGOSCOPY;  Surgeon: [name], MD;  Location: [place];   Service: ENT;  Laterality: N/A;  . ESOPHAGOSCOPY WITH DILITATION N/A 09/21/2014   Procedure: ESOPHAGOSCOPY WITH DILITATION;  Surgeon: [name], MD;  Location: [place];  Service: ENT;  Laterality: N/A;  . ESOPHAGOSCOPY WITH DILITATION N/A 07/04/2016   Procedure: ESOPHAGOSCOPY WITH DILITATION;  Surgeon: [name], MD;  Location: [facility] OR;  Service: ENT;  Laterality: N/A;  . ESOPHAGOSCOPY WITH DILITATION N/A 12/01/2017   Procedure: ESOPHAGOSCOPY WITH DILITATION;  Surgeon: [name], MD;  Location: [place];  Service: ENT;  Laterality: N/A;  . ESOPHAGOSCOPY WITH DILITATION N/A 04/19/2018   Procedure: ESOPHAGOSCOPY WITH DILITATION;  Surgeon: [name], MD;  Location: [place];  Service: ENT;  Laterality: N/A;  . FLEXIBLE BRONCHOSCOPY  01/08/2018      . FOREIGN BODY REMOVAL BRONCHIAL  10/02/2011   Procedure: REMOVAL FOREIGN BODY BRONCHIAL;  Surgeon: Ruby Cola, MD;  Location: [place];  Service: ENT;  Laterality: N/A;  . FOREIGN BODY REMOVAL BRONCHIAL N/A 01/08/2018   Procedure: REMOVAL FOREIGN BODY BRONCHIAL;  Surgeon: Jodi Marble, MD;  Location: [facility];  Service: ENT;  Laterality: N/A;  . LARYNGECTOMY    . Porta cath removal    . PORTACATH PLACEMENT  09/17/10   Tip in cavoatrial junction  . STOMAPLASTY N/A 10/21/2016   Procedure: Zola Button;  Surgeon: [name], MD;  Location: [place];  Service: ENT;  Laterality: N/A;  . TRACHEAL DILITATION  07/16/2011   Procedure: TRACHEAL DILITATION;  Surgeon: Beckie Salts, MD;  Location: [place];  Service: ENT;  Laterality: N/A;  dilation of tracheal stoma and replacement of stoma tube  . TUBAL LIGATION  1982    MEDICATIONS: . albuterol (PROVENTIL HFA;VENTOLIN HFA) 108 (90 Base) MCG/ACT inhaler  . amLODipine (NORVASC) 10 MG tablet  . buPROPion (WELLBUTRIN XL) 150 MG 24 hr tablet  . levothyroxine (SYNTHROID, LEVOTHROID) 175 MCG tablet  . losartan (COZAAR) 100 MG tablet  . metoprolol tartrate (LOPRESSOR) 50 MG tablet  . Misc. Devices (WRIST BRACE) MISC  .  mometasone-formoterol (DULERA) 200-5 MCG/ACT AERO  . nystatin ointment (MYCOSTATIN)  . omeprazole (PRILOSEC) 40 MG capsule  . TRANSDERM-SCOP, 1.5 MG, 1 MG/3DAYS   . Chlorhexidine Gluconate Cloth 2 % PADS 6 each   And  . Chlorhexidine Gluconate Cloth 2 % PADS 6 each    Myra Gianotti, PA-C Surgical Short Stay/Anesthesiology [facility] Phone [phone] [facility] Phone [phone] 06/30/2018 4:48 PM

## 2018-06-30 NOTE — Anesthesia Preprocedure Evaluation (Addendum)
Anesthesia Evaluation  Patient identified by MRN, date of birth, ID band Patient awake    Reviewed: Allergy & Precautions, NPO status , Patient's Chart, lab work & pertinent test results  Airway Mallampati: II  TM Distance: >3 FB Neck ROM: Full    Dental no notable dental hx. (+) Edentulous Upper, Edentulous Lower   Pulmonary asthma , COPD, former smoker,    Pulmonary exam normal breath sounds clear to auscultation       Cardiovascular Exercise Tolerance: Good hypertension, Pt. on medications and Pt. on home beta blockers Normal cardiovascular exam Rhythm:Regular Rate:Normal     Neuro/Psych PSYCHIATRIC DISORDERS Depression  Neuromuscular disease    GI/Hepatic Neg liver ROS, GERD  Medicated,  Endo/Other  negative endocrine ROSHypothyroidism   Renal/GU negative Renal ROS     Musculoskeletal negative musculoskeletal ROS (+)   Abdominal (+) + obese,   Peds  Hematology  (+) Blood dyscrasia, anemia , hgb 11.5   Anesthesia Other Findings   Reproductive/Obstetrics                           Lab Results  Component Value Date   WBC 6.6 06/30/2018   HGB 11.5 (L) 06/30/2018   HCT 37.1 06/30/2018   MCV 94.6 06/30/2018   PLT 376 06/30/2018    Anesthesia Physical Anesthesia Plan  ASA: III  Anesthesia Plan: General   Post-op Pain Management:    Induction: Intravenous  PONV Risk Score and Plan: 3 and Treatment may vary due to age or medical condition, Ondansetron and Dexamethasone  Airway Management Planned: Oral ETT  Additional Equipment:   Intra-op Plan:   Post-operative Plan: Extubation in OR  Informed Consent: I have reviewed the patients History and Physical, chart, labs and discussed the procedure including the risks, benefits and alternatives for the proposed anesthesia with the patient or authorized representative who has indicated his/her understanding and acceptance.      Dental advisory given  Plan Discussed with: CRNA  Anesthesia Plan Comments: (PAT note written 06/30/2018 by Myra Gianotti, PA-C.  Patient has trach and tracheoesophageal puncture (TEP) prosthesis.intubated through trach w eiter 5.5 or 6.5 et tubes )      Anesthesia Quick Evaluation

## 2018-06-30 NOTE — Progress Notes (Signed)
[name]:  History: [name] 06/30/2018  Referring physician: Bufford Lope, DO  Reason for consult/chief complaint: Dysphagia (food and medicatio getting stuck in throat)   Subjective  HPI:  Deborah [name] was referred by [name] of ENT for ongoing dysphagia.  She has a history of laryngeal cancer treated with surgery, radiation, chemotherapy and tracheostomy.  She has recurrent dysphagia, and underwent bougie dilation under anesthesia by [name] in January 2018, June 2019, and November 2019 up to a maximal diameter of 44 [place].  She reports minimal brief relief of dysphagia after most recent dilation, thus she was referred to [place].  She has dysphagia to both solids and liquids that occurs some days but not others.  She might drink some water and it feels completely stopped and then comes right back up.  Other times liquid, medicines and food will pass without much difficulty at all.  Her appetite is been good and her weight stable.  She had a feeding tube briefly during her cancer treatment.  Deborah [name] was also recently diagnosed with breast cancer, is scheduled early next week to have seed placement followed by surgery with Dr. Donne Hazel.  ROS:  Review of Systems  Constitutional: Negative for appetite change and unexpected weight change.  HENT: Negative for mouth sores and voice change.   Eyes: Negative for pain and redness.  Respiratory: Positive for cough and shortness of breath.   Cardiovascular: Negative for chest pain and palpitations.  Genitourinary: Negative for dysuria and hematuria.  Musculoskeletal: Positive for arthralgias, back pain and myalgias.  Skin: Negative for pallor and rash.  Allergic/Immunologic: Positive for environmental allergies.  Neurological: Negative for weakness and headaches.  Hematological: Negative for adenopathy.     Past Medical History: Past Medical History:  Diagnosis Date  . Anemia   . Arthritis      in knees  . Asthma   . Breast cancer ([place])   . Bronchitis   . COPD (chronic obstructive pulmonary disease) ([place])   . Diverticulosis 11/15/2012    noted on screening colonoscopy   . Dyspnea    with COPD exerbation  . Esophageal stricture   . Former smoker 03/19/2011  . GERD (gastroesophageal reflux disease)   . Heart murmur    asymptomatic   . History of laryngectomy   . 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 Hypertension   . 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 Internal hemorrhoid 11/15/2012    small, noted on screening colonoscopy   . 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  . Leukocytopenia   . Nausea alone 07/28/2013  . Neck pain 01/21/2012  . Normal MRI 07/14/11   negative for mestasis   . Pneumonia 2012  . Sciatica   . Seizures ([place])    07/24/11 off Effexor w/o seizure  . Sepsis ([place]) 08/04/12  . Sinusitis, chronic 07/20/2011   Bilateral maxillary, identified on MRI of head 07/14/11.    . Tracheostomy dependent [facility])      Past Surgical History: Past Surgical History:  Procedure Laterality Date  . COLONOSCOPY N/A 11/15/2012   Procedure: COLONOSCOPY;  Surgeon: Lafayette Dragon, MD;  Location: [facility];  Service: Endoscopy;  Laterality: N/A;  . DENTAL RESTORATION/EXTRACTION WITH X-RAY    . ESOPHAGOSCOPY  06/21/2012   Procedure: ESOPHAGOSCOPY;  Surgeon: [name], MD;  Location: [place];  Service: ENT;  Laterality: N/A;  .  ESOPHAGOSCOPY WITH DILITATION N/A 09/21/2014   Procedure: ESOPHAGOSCOPY WITH DILITATION;  Surgeon: [name], MD;  Location: [place];  Service: ENT;  Laterality: N/A;  . ESOPHAGOSCOPY WITH DILITATION N/A 07/04/2016   Procedure: ESOPHAGOSCOPY WITH DILITATION;  Surgeon: [name], MD;  Location: [place];  Service: ENT;  Laterality: N/A;  . ESOPHAGOSCOPY WITH DILITATION N/A 12/01/2017   Procedure: ESOPHAGOSCOPY WITH DILITATION;  Surgeon: [name], MD;  Location: [place];  Service: ENT;  Laterality: N/A;  . ESOPHAGOSCOPY WITH DILITATION N/A 04/19/2018   Procedure: ESOPHAGOSCOPY WITH DILITATION;  Surgeon: [name], MD;  Location: [place];  Service: ENT;  Laterality: N/A;  . FLEXIBLE BRONCHOSCOPY  01/08/2018      . FOREIGN BODY REMOVAL BRONCHIAL  10/02/2011   Procedure: REMOVAL FOREIGN BODY BRONCHIAL;  Surgeon: Ruby Cola, MD;  Location: [place];  Service: ENT;  Laterality: N/A;  . FOREIGN BODY REMOVAL BRONCHIAL N/A 01/08/2018   Procedure: REMOVAL FOREIGN BODY BRONCHIAL;  Surgeon: Jodi Marble, MD;  Location: [facility];  Service: ENT;  Laterality: N/A;  . LARYNGECTOMY    . Porta cath removal    . PORTACATH PLACEMENT  09/17/10   Tip in cavoatrial junction  . STOMAPLASTY N/A 10/21/2016   Procedure: Zola Button;  Surgeon: [name], MD;  Location: [place];  Service: ENT;  Laterality: N/A;  . TRACHEAL DILITATION  07/16/2011   Procedure: TRACHEAL DILITATION;  Surgeon: Beckie Salts, MD;  Location: [place];  Service: ENT;  Laterality: N/A;  dilation of tracheal stoma and replacement of stoma tube  . TUBAL LIGATION  1982     Family History: Family History  Problem Relation Age of Onset  . Heart disease Mother   . Heart disease Father   . Heart disease Sister   . Cancer Brother        type unknown  . Liver disease Maternal Grandmother   . Cancer Sister        type unknown  . Diabetes Sister   . Breast cancer Neg Hx     Social History: Social History   Socioeconomic History  . Marital status: Divorced    Spouse name: Not on file  . Number of children: 2  . Years of education: 85  . Highest education level: Not on file  Occupational History  . Occupation: part time    Comment: warehouse work  Scientific laboratory technician  . Financial resource strain: Not on file  . Food insecurity:    Worry: Not on file    Inability: Not on file  . Transportation needs:    Medical: Not on file    Non-medical: Not on file  Tobacco Use  . Smoking status: Former Smoker     Packs/day: 2.00    Years: 20.00    Pack years: 40.00    Types: Cigarettes    Last attempt to quit: 09/17/2010    Years since quitting: 7.7  . Smokeless tobacco: Never Used  Substance and Sexual Activity  . Alcohol use: Yes    Comment: Socially drinks   . Drug use: No    Comment: tried cocaine 1 time 2010 only used 1 time  . Sexual activity: Yes    Birth control/protection: Surgical  Lifestyle  . Physical activity:    Days per week: Not on file    Minutes per session: Not on file  . Stress: Not on file  Relationships  . Social connections:    Talks on phone: Not on file  Gets together: Not on file    Attends religious service: Not on file    Active member of club or organization: Not on file    Attends meetings of clubs or organizations: Not on file    Relationship status: Not on file  Other Topics Concern  . Not on file  Social History Narrative   Lives with boyfriend    Allergies: Allergies  Allergen Reactions  . Lisinopril Cough    Outpatient Meds: Current Outpatient Medications  Medication Sig Dispense Refill  . albuterol (PROVENTIL HFA;VENTOLIN HFA) 108 (90 Base) MCG/ACT inhaler INHALE 2 PUFFS BY MOUTH EVERY 4 HOURS AS NEEDED FOR WHEEZING OR SHORTNESS OF BREATH (Patient taking differently: Inhale 2 puffs into the lungs every 4 (four) hours as needed for wheezing or shortness of breath. ) 2 each 3  . amLODipine (NORVASC) 10 MG tablet TAKE 1 TABLET BY MOUTH ONCE DAILY (Patient taking differently: Take 10 mg by mouth daily. ) 90 tablet 3  . buPROPion (WELLBUTRIN XL) 150 MG 24 hr tablet Take 1 tablet (150 mg total) by mouth daily. 90 tablet 3  . levothyroxine (SYNTHROID, LEVOTHROID) 175 MCG tablet TAKE 1 TABLET BY MOUTH ONCE DAILY BEFORE BREAKFAST (Patient taking differently: Take 175 mcg by mouth daily before breakfast. ) 30 tablet 0  . losartan (COZAAR) 100 MG tablet TAKE 1 TABLET BY MOUTH ONCE DAILY (Patient taking differently: Take 100 mg by mouth daily. ) 30 tablet  0  . metoprolol tartrate (LOPRESSOR) 50 MG tablet TAKE 1 TABLET BY MOUTH TWICE DAILY (Patient taking differently: Take 50 mg by mouth 2 (two) times daily. ) 60 tablet 2  . Misc. Devices (WRIST BRACE) MISC 1 Units by Does not apply route at bedtime. Wear braces on both hands every night. 2 each 0  . mometasone-formoterol (DULERA) 200-5 MCG/ACT AERO Inhale 2 puffs into the lungs 2 (two) times daily. 13 g 2  . nystatin ointment (MYCOSTATIN) Apply 1 application topically 2 (two) times daily as needed (for rash).    [name] omeprazole (PRILOSEC) 40 MG capsule Take 40 mg by mouth 2 (two) times daily.     . TRANSDERM-SCOP, 1.5 MG, 1 MG/3DAYS PLACE 1 PATCH ONTO THE SKIN EVERY 3 DAYS AS NEEDED (Patient taking differently: Place 1 patch onto the skin every three (3) days as needed (for mucus). ) 10 patch 2   No current facility-administered medications for this visit.       ___________________________________________________________________ Objective   Exam:  BP (!) 146/78 (BP Location: Left Arm, Patient Position: Sitting, Cuff Size: Normal)   Pulse 96   Ht 5' 6.5" (1.689 m) Comment: height measured without shoes  Wt 223 lb 2 oz (101.2 kg)   BMI 35.47 kg/m    General: this is a(n) very pleasant and well-appearing woman with a tracheostomy, and she is able to speak if she occludes it.  Eyes: sclera anicteric, no redness  ENT: oral mucosa moist without lesions, no cervical or supraclavicular lymphadenopathy.  Tracheostomy  CV: RRR without murmur, S1/S2, no JVD, no peripheral edema  Resp: clear to auscultation bilaterally, normal RR and effort noted  GI: soft, no tenderness, with active bowel sounds. No guarding or palpable organomegaly noted.  Skin; warm and dry, no rash or jaundice noted  Neuro: awake, alert and oriented x 3. Normal gross motor function and fluent speech  Radiologic Studies:  2018 barium study  CLINICAL DATA:  Dysphagia, history of laryngeal carcinoma    EXAM: ESOPHOGRAM/BARIUM SWALLOW   TECHNIQUE: Single  contrast examination was performed using  thin barium.   FLUOROSCOPY TIME:  Fluoroscopy Time:  1 minutes 24 seconds   Radiation Exposure Index (if provided by the fluoroscopic device): 307 mGy   Number of Acquired Spot Images: 0   COMPARISON:  Barium swallow of 09/13/2014   FINDINGS: A single contrast barium swallow was performed in this patient with tracheostomy. The swallowing mechanism is unremarkable. Compared to the prior study from 2016 the apparently extrinsic indentation upon the lower cervical esophagus from the right of midline, just above the tracheostomy is unchanged. This could be due to scarring, being stable over the intervening 2 years. A submucosal lesion however would be difficult to exclude as would indentation by thyroid nodule from the right. No aspiration or penetration is seen. Esophageal peristalsis is otherwise normal. There is a small hiatal hernia present and mild gastroesophageal reflux is demonstrated. A barium pill was given at the end the study which passed into the stomach without significant delay.   IMPRESSION: 1. As noted on the barium swallow from 2016, there is extrinsic indentation upon the lower cervical esophagus from the right of midline just above the tracheostomy. This has not changed over the intervening 2 years and most likely is therefore benign possibly related to postop scarring or benign submucosal lesion. 2. Small hiatal hernia with mild gastroesophageal reflux. 3. Barium pill passes into the stomach without delay.     Electronically Signed   By: Ivar Drape M.D.   On: 06/24/2016 11:06   Assessment: Encounter Diagnoses  Name Primary?  . Pharyngoesophageal dysphagia Yes  . Esophageal stricture   . History of head and neck cancer     I suspect that her persistent dysphagia that is no longer responsive to 44 French bougie dilation is due to fixed obstruction that is  not amenable to dilation, obstruction that requires larger diameter dilation, dysmotility, or some combination of those.  Based on her description of its intermittent behavior, I suspect it is more a motility issue than fixed obstruction.  Plan:  Barium swallow later this week.  This will help was planned whether or not upper endoscopy will be helpful, and if so what mechanism of dilation should be used.  We will then contacted with recommendations, and she needs to recover from upcoming breast cancer surgery before endoscopic treatments.  Thank you for the courtesy of this consult.  Please call me with any questions or concerns.  Nelida Meuse III  CC: Referring provider noted above

## 2018-06-30 NOTE — Patient Instructions (Addendum)
If you are age 61 or older, your body mass index should be between 23-30. Your Body mass index is 35.47 kg/m. If this is out of the aforementioned range listed, please consider follow up with your Primary Care Provider.  If you are age 39 or younger, your body mass index should be between 19-25. Your Body mass index is 35.47 kg/m. If this is out of the aformentioned range listed, please consider follow up with your Primary Care Provider.   You have been scheduled for a Barium Esophogram at [facility] (1st floor of the hospital) on 07/02/2018 at 9:45am. Please arrive 15 minutes prior to your appointment for registration. Make certain not to have anything to eat or drink 3 hours prior to your test. If you need to reschedule for any reason, please contact radiology at [phone] to do so. __________________________________________________________________ A barium swallow is an examination that concentrates on views of the esophagus. This tends to be a double contrast exam (barium and two liquids which, when combined, create a gas to distend the wall of the oesophagus) or single contrast (non-ionic iodine based). The study is usually tailored to your symptoms so a good history is essential. Attention is paid during the study to the form, structure and configuration of the esophagus, looking for functional disorders (such as aspiration, dysphagia, achalasia, motility and reflux) EXAMINATION You may be asked to change into a gown, depending on the type of swallow being performed. A radiologist and radiographer will perform the procedure. The radiologist will advise you of the type of contrast selected for your procedure and direct you during the exam. You will be asked to stand, sit or lie in several different positions and to hold a small amount of fluid in your mouth before being asked to swallow while the imaging is performed .In some instances you may be asked to swallow barium coated marshmallows  to assess the motility of a solid food bolus. The exam can be recorded as a digital or video fluoroscopy procedure. POST PROCEDURE It will take 1-2 days for the barium to pass through your system. To facilitate this, it is important, unless otherwise directed, to increase your fluids for the next 24-48hrs and to resume your normal diet.  This test typically takes about 30 minutes to perform.  Thank you for choosing me and [facility].    __________________________________________________________________________________

## 2018-07-02 ENCOUNTER — Other Ambulatory Visit: Payer: Self-pay | Admitting: Gastroenterology

## 2018-07-02 ENCOUNTER — Ambulatory Visit (HOSPITAL_COMMUNITY)
Admission: RE | Admit: 2018-07-02 | Discharge: 2018-07-02 | Disposition: A | Discharge status: Home / Self Care | Payer: Medicaid Other | Source: Ambulatory Visit | Attending: Gastroenterology | Admitting: Gastroenterology

## 2018-07-02 DIAGNOSIS — Z8589 Personal history of malignant neoplasm of other organs and systems: Secondary | ICD-10-CM | POA: Insufficient documentation

## 2018-07-02 DIAGNOSIS — R1314 Dysphagia, pharyngoesophageal phase: Secondary | ICD-10-CM

## 2018-07-02 DIAGNOSIS — K222 Esophageal obstruction: Secondary | ICD-10-CM

## 2018-07-02 DIAGNOSIS — R1310 Dysphagia, unspecified: Secondary | ICD-10-CM | POA: Diagnosis not present

## 2018-07-05 ENCOUNTER — Ambulatory Visit
Admission: RE | Admit: 2018-07-05 | Discharge: 2018-07-05 | Disposition: A | Discharge status: Home / Self Care | Payer: Medicaid Other | Source: Ambulatory Visit | Attending: General Surgery | Admitting: General Surgery

## 2018-07-05 DIAGNOSIS — D0511 Intraductal carcinoma in situ of right breast: Secondary | ICD-10-CM

## 2018-07-06 ENCOUNTER — Ambulatory Visit (HOSPITAL_COMMUNITY): Payer: Medicaid Other | Admitting: Vascular Surgery

## 2018-07-06 ENCOUNTER — Ambulatory Visit
Admission: RE | Admit: 2018-07-06 | Discharge: 2018-07-06 | Disposition: A | Discharge status: Home / Self Care | Payer: Medicaid Other | Source: Ambulatory Visit | Attending: General Surgery | Admitting: General Surgery

## 2018-07-06 ENCOUNTER — Observation Stay (HOSPITAL_COMMUNITY)
Admission: RE | Admit: 2018-07-06 | Discharge: 2018-07-07 | Disposition: A | Discharge status: Home / Self Care | Payer: Medicaid Other | Attending: General Surgery | Admitting: General Surgery

## 2018-07-06 ENCOUNTER — Other Ambulatory Visit: Payer: Self-pay

## 2018-07-06 ENCOUNTER — Encounter (HOSPITAL_COMMUNITY): Payer: Self-pay

## 2018-07-06 ENCOUNTER — Encounter (HOSPITAL_COMMUNITY)
Admission: RE | Disposition: A | Discharge status: Home / Self Care | Payer: Self-pay | Source: Home / Self Care | Attending: General Surgery

## 2018-07-06 ENCOUNTER — Ambulatory Visit (HOSPITAL_COMMUNITY): Payer: Medicaid Other | Admitting: Anesthesiology

## 2018-07-06 DIAGNOSIS — N62 Hypertrophy of breast: Secondary | ICD-10-CM | POA: Diagnosis not present

## 2018-07-06 DIAGNOSIS — J45909 Unspecified asthma, uncomplicated: Secondary | ICD-10-CM | POA: Diagnosis not present

## 2018-07-06 DIAGNOSIS — J449 Chronic obstructive pulmonary disease, unspecified: Secondary | ICD-10-CM | POA: Diagnosis not present

## 2018-07-06 DIAGNOSIS — K222 Esophageal obstruction: Secondary | ICD-10-CM

## 2018-07-06 DIAGNOSIS — D0511 Intraductal carcinoma in situ of right breast: Secondary | ICD-10-CM

## 2018-07-06 DIAGNOSIS — C50919 Malignant neoplasm of unspecified site of unspecified female breast: Secondary | ICD-10-CM | POA: Diagnosis present

## 2018-07-06 DIAGNOSIS — N6011 Diffuse cystic mastopathy of right breast: Secondary | ICD-10-CM | POA: Diagnosis not present

## 2018-07-06 DIAGNOSIS — Z87891 Personal history of nicotine dependence: Secondary | ICD-10-CM | POA: Insufficient documentation

## 2018-07-06 DIAGNOSIS — Z79899 Other long term (current) drug therapy: Secondary | ICD-10-CM | POA: Insufficient documentation

## 2018-07-06 DIAGNOSIS — I10 Essential (primary) hypertension: Secondary | ICD-10-CM | POA: Diagnosis not present

## 2018-07-06 HISTORY — PX: BREAST LUMPECTOMY WITH RADIOACTIVE SEED LOCALIZATION: SHX6424

## 2018-07-06 HISTORY — PX: BREAST LUMPECTOMY: SHX2

## 2018-07-06 SURGERY — BREAST LUMPECTOMY WITH RADIOACTIVE SEED LOCALIZATION
Anesthesia: General | Site: Breast | Laterality: Right

## 2018-07-06 MED ORDER — BUPIVACAINE-EPINEPHRINE 0.25% -1:200000 IJ SOLN
INTRAMUSCULAR | Status: DC | PRN
  Administered 2018-07-06: 10 mL

## 2018-07-06 MED ORDER — ACETAMINOPHEN 500 MG PO TABS
1000.0000 mg | ORAL_TABLET | Freq: Four times a day (QID) | ORAL | Status: DC
  Administered 2018-07-06 – 2018-07-07 (×3): 1000 mg via ORAL
  Filled 2018-07-06 (×4): qty 2

## 2018-07-06 MED ORDER — MIDAZOLAM HCL 2 MG/2ML IJ SOLN
INTRAMUSCULAR | Status: AC
  Filled 2018-07-06: qty 2

## 2018-07-06 MED ORDER — AMLODIPINE BESYLATE 10 MG PO TABS
10.0000 mg | ORAL_TABLET | Freq: Every day | ORAL | Status: DC
  Administered 2018-07-07: 10 mg via ORAL
  Filled 2018-07-06: qty 1

## 2018-07-06 MED ORDER — ACETAMINOPHEN 500 MG PO TABS
1000.0000 mg | ORAL_TABLET | ORAL | Status: AC
  Administered 2018-07-06: 1000 mg via ORAL
  Filled 2018-07-06: qty 2

## 2018-07-06 MED ORDER — ONDANSETRON 4 MG PO TBDP: 4.0000 mg | ORAL_TABLET | Freq: Four times a day (QID) | ORAL | Status: DC | PRN

## 2018-07-06 MED ORDER — MORPHINE SULFATE (PF) 2 MG/ML IV SOLN: 1.0000 mg | INTRAVENOUS | Status: DC | PRN

## 2018-07-06 MED ORDER — PHENYLEPHRINE 40 MCG/ML (10ML) SYRINGE FOR IV PUSH (FOR BLOOD PRESSURE SUPPORT)
PREFILLED_SYRINGE | INTRAVENOUS | Status: DC | PRN
  Administered 2018-07-06: 40 ug via INTRAVENOUS
  Administered 2018-07-06: 80 ug via INTRAVENOUS
  Administered 2018-07-06: 120 ug via INTRAVENOUS
  Administered 2018-07-06 (×2): 80 ug via INTRAVENOUS

## 2018-07-06 MED ORDER — CEFAZOLIN SODIUM-DEXTROSE 2-4 GM/100ML-% IV SOLN
2.0000 g | INTRAVENOUS | Status: AC
  Administered 2018-07-06: 2 g via INTRAVENOUS

## 2018-07-06 MED ORDER — OXYCODONE HCL 5 MG PO TABS
5.0000 mg | ORAL_TABLET | ORAL | Status: DC | PRN
  Administered 2018-07-07: 5 mg via ORAL
  Filled 2018-07-06: qty 1

## 2018-07-06 MED ORDER — ENOXAPARIN SODIUM 40 MG/0.4ML ~~LOC~~ SOLN
40.0000 mg | SUBCUTANEOUS | Status: DC
  Administered 2018-07-07: 40 mg via SUBCUTANEOUS
  Filled 2018-07-06: qty 0.4

## 2018-07-06 MED ORDER — ROCURONIUM BROMIDE 50 MG/5ML IV SOSY
PREFILLED_SYRINGE | INTRAVENOUS | Status: DC | PRN
  Administered 2018-07-06: 20 mg via INTRAVENOUS

## 2018-07-06 MED ORDER — 0.9 % SODIUM CHLORIDE (POUR BTL) OPTIME
TOPICAL | Status: DC | PRN
  Administered 2018-07-06: 1000 mL

## 2018-07-06 MED ORDER — ENSURE PRE-SURGERY PO LIQD
296.0000 mL | Freq: Once | ORAL | Status: DC
  Filled 2018-07-06: qty 296

## 2018-07-06 MED ORDER — EPHEDRINE SULFATE-NACL 50-0.9 MG/10ML-% IV SOSY
PREFILLED_SYRINGE | INTRAVENOUS | Status: DC | PRN
  Administered 2018-07-06: 5 mg via INTRAVENOUS

## 2018-07-06 MED ORDER — LACTATED RINGERS IV SOLN
INTRAVENOUS | Status: DC
  Administered 2018-07-06: 11:00:00 via INTRAVENOUS

## 2018-07-06 MED ORDER — PROPOFOL 10 MG/ML IV BOLUS
INTRAVENOUS | Status: DC | PRN
  Administered 2018-07-06: 120 mg via INTRAVENOUS

## 2018-07-06 MED ORDER — ALBUTEROL SULFATE (2.5 MG/3ML) 0.083% IN NEBU: 3.0000 mL | INHALATION_SOLUTION | RESPIRATORY_TRACT | Status: DC | PRN

## 2018-07-06 MED ORDER — GABAPENTIN 100 MG PO CAPS
100.0000 mg | ORAL_CAPSULE | ORAL | Status: AC
  Administered 2018-07-06: 100 mg via ORAL
  Filled 2018-07-06: qty 1

## 2018-07-06 MED ORDER — PANTOPRAZOLE SODIUM 40 MG PO TBEC
40.0000 mg | DELAYED_RELEASE_TABLET | Freq: Every day | ORAL | Status: DC
  Administered 2018-07-07: 40 mg via ORAL
  Filled 2018-07-06: qty 1

## 2018-07-06 MED ORDER — HYDROMORPHONE HCL 1 MG/ML IJ SOLN: 0.2500 mg | INTRAMUSCULAR | Status: DC | PRN

## 2018-07-06 MED ORDER — EPHEDRINE 5 MG/ML INJ
INTRAVENOUS | Status: AC
  Filled 2018-07-06: qty 10

## 2018-07-06 MED ORDER — SIMETHICONE 80 MG PO CHEW: 40.0000 mg | CHEWABLE_TABLET | Freq: Four times a day (QID) | ORAL | Status: DC | PRN

## 2018-07-06 MED ORDER — PROPOFOL 10 MG/ML IV BOLUS
INTRAVENOUS | Status: AC
  Filled 2018-07-06: qty 20

## 2018-07-06 MED ORDER — FENTANYL CITRATE (PF) 250 MCG/5ML IJ SOLN
INTRAMUSCULAR | Status: AC
  Filled 2018-07-06: qty 5

## 2018-07-06 MED ORDER — OXYCODONE HCL 5 MG/5ML PO SOLN: 5.0000 mg | Freq: Once | ORAL | Status: DC | PRN

## 2018-07-06 MED ORDER — PHENYLEPHRINE 40 MCG/ML (10ML) SYRINGE FOR IV PUSH (FOR BLOOD PRESSURE SUPPORT)
PREFILLED_SYRINGE | INTRAVENOUS | Status: AC
  Filled 2018-07-06: qty 10

## 2018-07-06 MED ORDER — DEXAMETHASONE SODIUM PHOSPHATE 10 MG/ML IJ SOLN
INTRAMUSCULAR | Status: DC | PRN
  Administered 2018-07-06: 4 mg via INTRAVENOUS

## 2018-07-06 MED ORDER — CEFAZOLIN SODIUM-DEXTROSE 2-4 GM/100ML-% IV SOLN
INTRAVENOUS | Status: AC
  Filled 2018-07-06: qty 100

## 2018-07-06 MED ORDER — DEXAMETHASONE SODIUM PHOSPHATE 10 MG/ML IJ SOLN
INTRAMUSCULAR | Status: AC
  Filled 2018-07-06: qty 1

## 2018-07-06 MED ORDER — SCOPOLAMINE 1 MG/3DAYS TD PT72: 1.0000 | MEDICATED_PATCH | TRANSDERMAL | Status: DC | PRN

## 2018-07-06 MED ORDER — ONDANSETRON HCL 4 MG/2ML IJ SOLN
INTRAMUSCULAR | Status: DC | PRN
  Administered 2018-07-06: 4 mg via INTRAVENOUS

## 2018-07-06 MED ORDER — OXYCODONE HCL 5 MG PO TABS: 5.0000 mg | ORAL_TABLET | Freq: Once | ORAL | Status: DC | PRN

## 2018-07-06 MED ORDER — ONDANSETRON HCL 4 MG/2ML IJ SOLN: 4.0000 mg | Freq: Four times a day (QID) | INTRAMUSCULAR | Status: DC | PRN

## 2018-07-06 MED ORDER — LOSARTAN POTASSIUM 50 MG PO TABS
100.0000 mg | ORAL_TABLET | Freq: Every day | ORAL | Status: DC
  Administered 2018-07-07: 100 mg via ORAL
  Filled 2018-07-06: qty 2

## 2018-07-06 MED ORDER — MOMETASONE FURO-FORMOTEROL FUM 200-5 MCG/ACT IN AERO
2.0000 | INHALATION_SPRAY | Freq: Two times a day (BID) | RESPIRATORY_TRACT | Status: DC
  Administered 2018-07-06 – 2018-07-07 (×2): 2 via RESPIRATORY_TRACT
  Filled 2018-07-06: qty 8.8

## 2018-07-06 MED ORDER — LIDOCAINE 2% (20 MG/ML) 5 ML SYRINGE
INTRAMUSCULAR | Status: AC
  Filled 2018-07-06: qty 5

## 2018-07-06 MED ORDER — BUPROPION HCL ER (XL) 150 MG PO TB24
150.0000 mg | ORAL_TABLET | Freq: Every day | ORAL | Status: DC
  Administered 2018-07-07: 150 mg via ORAL
  Filled 2018-07-06: qty 1

## 2018-07-06 MED ORDER — METOPROLOL TARTRATE 50 MG PO TABS
50.0000 mg | ORAL_TABLET | Freq: Two times a day (BID) | ORAL | Status: DC
  Administered 2018-07-06 – 2018-07-07 (×2): 50 mg via ORAL
  Filled 2018-07-06 (×2): qty 1

## 2018-07-06 MED ORDER — MIDAZOLAM HCL 5 MG/5ML IJ SOLN
INTRAMUSCULAR | Status: DC | PRN
  Administered 2018-07-06: 2 mg via INTRAVENOUS

## 2018-07-06 MED ORDER — LEVOTHYROXINE SODIUM 75 MCG PO TABS
175.0000 ug | ORAL_TABLET | Freq: Every day | ORAL | Status: DC
  Administered 2018-07-07: 175 ug via ORAL
  Filled 2018-07-06: qty 1

## 2018-07-06 MED ORDER — LIDOCAINE 2% (20 MG/ML) 5 ML SYRINGE
INTRAMUSCULAR | Status: DC | PRN
  Administered 2018-07-06: 60 mg via INTRAVENOUS

## 2018-07-06 MED ORDER — ROCURONIUM BROMIDE 50 MG/5ML IV SOSY
PREFILLED_SYRINGE | INTRAVENOUS | Status: AC
  Filled 2018-07-06: qty 5

## 2018-07-06 MED ORDER — SUGAMMADEX SODIUM 200 MG/2ML IV SOLN
INTRAVENOUS | Status: DC | PRN
  Administered 2018-07-06 (×2): 100 mg via INTRAVENOUS

## 2018-07-06 MED ORDER — ONDANSETRON HCL 4 MG/2ML IJ SOLN
INTRAMUSCULAR | Status: AC
  Filled 2018-07-06: qty 2

## 2018-07-06 MED ORDER — BUPIVACAINE-EPINEPHRINE (PF) 0.25% -1:200000 IJ SOLN
INTRAMUSCULAR | Status: AC
  Filled 2018-07-06: qty 30

## 2018-07-06 MED ORDER — FENTANYL CITRATE (PF) 100 MCG/2ML IJ SOLN
INTRAMUSCULAR | Status: DC | PRN
  Administered 2018-07-06 (×2): 50 ug via INTRAVENOUS

## 2018-07-06 MED ORDER — KETOROLAC TROMETHAMINE 15 MG/ML IJ SOLN
15.0000 mg | INTRAMUSCULAR | Status: AC
  Administered 2018-07-06: 15 mg via INTRAVENOUS
  Filled 2018-07-06: qty 1

## 2018-07-06 SURGICAL SUPPLY — 47 items
APPLIER CLIP 9.375 MED OPEN (MISCELLANEOUS)
BINDER BREAST LRG (GAUZE/BANDAGES/DRESSINGS) IMPLANT
BINDER BREAST XLRG (GAUZE/BANDAGES/DRESSINGS) ×2 IMPLANT
BLADE SURG 15 STRL LF DISP TIS (BLADE) ×1 IMPLANT
BLADE SURG 15 STRL SS (BLADE) ×1
CANISTER SUCT 3000ML PPV (MISCELLANEOUS) ×2 IMPLANT
CHLORAPREP W/TINT 26ML (MISCELLANEOUS) ×2 IMPLANT
CLIP APPLIE 9.375 MED OPEN (MISCELLANEOUS) IMPLANT
CLIP VESOCCLUDE MED 6/CT (CLIP) ×2 IMPLANT
COVER PROBE W GEL 5X96 (DRAPES) ×2 IMPLANT
COVER SURGICAL LIGHT HANDLE (MISCELLANEOUS) ×2 IMPLANT
COVER WAND RF STERILE (DRAPES) ×2 IMPLANT
DERMABOND ADVANCED (GAUZE/BANDAGES/DRESSINGS) ×1
DERMABOND ADVANCED .7 DNX12 (GAUZE/BANDAGES/DRESSINGS) ×1 IMPLANT
DEVICE DUBIN SPECIMEN MAMMOGRA (MISCELLANEOUS) ×2 IMPLANT
DRAPE CHEST BREAST 15X10 FENES (DRAPES) ×2 IMPLANT
DRAPE UTILITY XL STRL (DRAPES) ×2 IMPLANT
ELECT COATED BLADE 2.86 ST (ELECTRODE) ×2 IMPLANT
ELECT REM PT RETURN 9FT ADLT (ELECTROSURGICAL) ×2
ELECTRODE REM PT RTRN 9FT ADLT (ELECTROSURGICAL) ×1 IMPLANT
GLOVE BIO SURGEON STRL SZ7 (GLOVE) ×4 IMPLANT
GLOVE BIOGEL PI IND STRL 7.5 (GLOVE) ×1 IMPLANT
GLOVE BIOGEL PI INDICATOR 7.5 (GLOVE) ×1
GOWN STRL REUS W/ TWL LRG LVL3 (GOWN DISPOSABLE) ×2 IMPLANT
GOWN STRL REUS W/TWL LRG LVL3 (GOWN DISPOSABLE) ×2
KIT BASIN OR (CUSTOM PROCEDURE TRAY) ×2 IMPLANT
KIT MARKER MARGIN INK (KITS) ×2 IMPLANT
LIGHT WAVEGUIDE WIDE FLAT (MISCELLANEOUS) IMPLANT
NEEDLE HYPO 25GX1X1/2 BEV (NEEDLE) ×2 IMPLANT
NS IRRIG 1000ML POUR BTL (IV SOLUTION) ×2 IMPLANT
PACK SURGICAL SETUP 50X90 (CUSTOM PROCEDURE TRAY) ×2 IMPLANT
PENCIL BUTTON HOLSTER BLD 10FT (ELECTRODE) ×2 IMPLANT
SPONGE LAP 18X18 X RAY DECT (DISPOSABLE) ×2 IMPLANT
STRIP CLOSURE SKIN 1/2X4 (GAUZE/BANDAGES/DRESSINGS) ×2 IMPLANT
SUT MNCRL AB 4-0 PS2 18 (SUTURE) ×2 IMPLANT
SUT MON AB 5-0 PS2 18 (SUTURE) ×2 IMPLANT
SUT SILK 2 0 SH (SUTURE) ×2 IMPLANT
SUT VIC AB 2-0 SH 27 (SUTURE) ×1
SUT VIC AB 2-0 SH 27XBRD (SUTURE) ×1 IMPLANT
SUT VIC AB 3-0 SH 27 (SUTURE) ×1
SUT VIC AB 3-0 SH 27X BRD (SUTURE) ×1 IMPLANT
SYR BULB 3OZ (MISCELLANEOUS) ×2 IMPLANT
SYR CONTROL 10ML LL (SYRINGE) ×2 IMPLANT
TOWEL OR 17X24 6PK STRL BLUE (TOWEL DISPOSABLE) ×2 IMPLANT
TOWEL OR 17X26 10 PK STRL BLUE (TOWEL DISPOSABLE) IMPLANT
TUBE CONNECTING 12X1/4 (SUCTIONS) IMPLANT
YANKAUER SUCT BULB TIP NO VENT (SUCTIONS) IMPLANT

## 2018-07-06 NOTE — H&P (View-Only) (Signed)
  39 yof referred by Dr Shawna Orleans for new right breast dcis. she has history laryngeal cancer in 2012 treated with laryngectomy/chemo/rads and she is doing well. had a g tube then and has permanent trach. she did does not have fh. no mass or dc. had screening mm that shows 1.9 cm of right breast calcs in medial breast. ax [place] is negative. she underwent core biopsy that shows hg dcis that is er/pr positive. she is here to discuss options today.  Past Surgical History Breast Biopsy  Right. Oral Surgery   Diagnostic Studies History  Colonoscopy  5-10 years ago  Medication History  Medications Reconciled  Social History  Alcohol use  Occasional alcohol use. Caffeine use  Tea. No drug use  Tobacco use  Former smoker.  Family History Heart Disease  Father, Mother, Sister.  Pregnancy / Birth History  Age at menarche  54 years. Gravida  2 Para  2  Other Problems Arthritis  Asthma  Cancer  Depression  Diverticulosis  Gastroesophageal Reflux Disease  Heart murmur  Hemorrhoids  High blood pressure  Other disease, cancer, significant illness  Seizure Disorder   Review of Systems  General Not Present- Appetite Loss, Chills, Fatigue, Fever, Night Sweats, Weight Gain and Weight Loss. Skin Not Present- Change in Wart/Mole, Dryness, Hives, Jaundice, New Lesions, Non-Healing Wounds, Rash and Ulcer. HEENT Not Present- Earache, Hearing Loss, Hoarseness, Nose Bleed, Oral Ulcers, Ringing in the Ears, Seasonal Allergies, Sinus Pain, Sore Throat, Visual Disturbances, Wears glasses/contact lenses and Yellow Eyes. Respiratory Present- Difficulty Breathing. Not Present- Bloody sputum, Chronic Cough, Snoring and Wheezing. Musculoskeletal Present- Back Pain and Joint Pain. Not Present- Joint Stiffness, Muscle Pain, Muscle Weakness and Swelling of Extremities. Neurological Not Present- Decreased Memory, Fainting, Headaches, Numbness, Seizures, Tingling, Tremor, Trouble walking and  Weakness. Psychiatric Present- Depression. Not Present- Anxiety, Bipolar, Change in Sleep Pattern, Fearful and Frequent crying. Endocrine Not Present- Cold Intolerance, Excessive Hunger, Hair Changes, Heat Intolerance, Hot flashes and New Diabetes. Hematology Not Present- Blood Thinners, Easy Bruising, Excessive bleeding, Gland problems, HIV and Persistent Infections.  Physical Exam  General Mental Status-Alert. Head and Neck Trachea -Note: tracheostomy in place. Eye Sclera/Conjunctiva - Bilateral-No scleral icterus. Chest and Lung Exam Chest and lung exam reveals -quiet, even and easy respiratory effort with no use of accessory muscles and on auscultation, normal breath sounds, no adventitious sounds and normal vocal resonance. Breast Nipples-No Discharge. Breast Lump-No Palpable Breast Mass. Cardiovascular Cardiovascular examination reveals -normal heart sounds, regular rate and rhythm with no murmurs. Abdomen Note: soft no hm Neurologic Neurologic evaluation reveals -alert and oriented x 3 with no impairment of recent or remote memory. Lymphatic Head & Neck General Head & Neck Lymphatics: Bilateral - Description - Normal. Axillary General Axillary Region: Bilateral - Description - Normal. Note: no [place] adenopathy   Assessment & Plan  BREAST NEOPLASM, TIS (DCIS), RIGHT (D05.11) Story: Right breast seed guided lumpectomy we discussed staging and pathophysiology of breast cancer and that she has a stage 0 breast cancer. she does not need a sentinel node biopsy. due to high grade she is not eligible for comet trial. discussed lumpectomy with radiation vs mastectomy. no benefit to mastectomy for her. discussed antiestrogens as well. we will plan for seed guided lumpectomy. will keep her overnight, will have her see anesthesia preop and check with ent office as well. discussed 5-10% risk of a positive margin necessitating further surgery, as well as bleeding,  infection will proceed with scheduling

## 2018-07-06 NOTE — H&P (Signed)
  70 yof referred by Dr Shawna Orleans for new right breast dcis. she has history laryngeal cancer in 2012 treated with laryngectomy/chemo/rads and she is doing well. had a g tube then and has permanent trach. she did does not have fh. no mass or dc. had screening mm that shows 1.9 cm of right breast calcs in medial breast. ax [place] is negative. she underwent core biopsy that shows hg dcis that is er/pr positive. she is here to discuss options today.  Past Surgical History Breast Biopsy  Right. Oral Surgery   Diagnostic Studies History  Colonoscopy  5-10 years ago  Medication History  Medications Reconciled  Social History  Alcohol use  Occasional alcohol use. Caffeine use  Tea. No drug use  Tobacco use  Former smoker.  Family History Heart Disease  Father, Mother, Sister.  Pregnancy / Birth History  Age at menarche  72 years. Gravida  2 Para  2  Other Problems Arthritis  Asthma  Cancer  Depression  Diverticulosis  Gastroesophageal Reflux Disease  Heart murmur  Hemorrhoids  High blood pressure  Other disease, cancer, significant illness  Seizure Disorder   Review of Systems  General Not Present- Appetite Loss, Chills, Fatigue, Fever, Night Sweats, Weight Gain and Weight Loss. Skin Not Present- Change in Wart/Mole, Dryness, Hives, Jaundice, New Lesions, Non-Healing Wounds, Rash and Ulcer. HEENT Not Present- Earache, Hearing Loss, Hoarseness, Nose Bleed, Oral Ulcers, Ringing in the Ears, Seasonal Allergies, Sinus Pain, Sore Throat, Visual Disturbances, Wears glasses/contact lenses and Yellow Eyes. Respiratory Present- Difficulty Breathing. Not Present- Bloody sputum, Chronic Cough, Snoring and Wheezing. Musculoskeletal Present- Back Pain and Joint Pain. Not Present- Joint Stiffness, Muscle Pain, Muscle Weakness and Swelling of Extremities. Neurological Not Present- Decreased Memory, Fainting, Headaches, Numbness, Seizures, Tingling, Tremor, Trouble walking and  Weakness. Psychiatric Present- Depression. Not Present- Anxiety, Bipolar, Change in Sleep Pattern, Fearful and Frequent crying. Endocrine Not Present- Cold Intolerance, Excessive Hunger, Hair Changes, Heat Intolerance, Hot flashes and New Diabetes. Hematology Not Present- Blood Thinners, Easy Bruising, Excessive bleeding, Gland problems, HIV and Persistent Infections.  Physical Exam  General Mental Status-Alert. Head and Neck Trachea -Note: tracheostomy in place. Eye Sclera/Conjunctiva - Bilateral-No scleral icterus. Chest and Lung Exam Chest and lung exam reveals -quiet, even and easy respiratory effort with no use of accessory muscles and on auscultation, normal breath sounds, no adventitious sounds and normal vocal resonance. Breast Nipples-No Discharge. Breast Lump-No Palpable Breast Mass. Cardiovascular Cardiovascular examination reveals -normal heart sounds, regular rate and rhythm with no murmurs. Abdomen Note: soft no hm Neurologic Neurologic evaluation reveals -alert and oriented x 3 with no impairment of recent or remote memory. Lymphatic Head & Neck General Head & Neck Lymphatics: Bilateral - Description - Normal. Axillary General Axillary Region: Bilateral - Description - Normal. Note: no [place] adenopathy   Assessment & Plan  BREAST NEOPLASM, TIS (DCIS), RIGHT (D05.11) Story: Right breast seed guided lumpectomy we discussed staging and pathophysiology of breast cancer and that she has a stage 0 breast cancer. she does not need a sentinel node biopsy. due to high grade she is not eligible for comet trial. discussed lumpectomy with radiation vs mastectomy. no benefit to mastectomy for her. discussed antiestrogens as well. we will plan for seed guided lumpectomy. will keep her overnight, will have her see anesthesia preop and check with ent office as well. discussed 5-10% risk of a positive margin necessitating further surgery, as well as bleeding,  infection will proceed with scheduling

## 2018-07-06 NOTE — Transfer of Care (Signed)
Immediate Anesthesia Transfer of Care Note  Patient: Deborah [name]  Procedure(s) Performed: RIGHT BREAST LUMPECTOMY WITH RADIOACTIVE SEED LOCALIZATION (Right Breast)  Patient Location: PACU  Anesthesia Type:General  Level of Consciousness: awake, alert  and oriented  Airway & Oxygen Therapy: Patient Spontanous Breathing  Post-op Assessment: Report given to RN and Post -op Vital signs reviewed and stable  Post vital signs: Reviewed and stable  Last Vitals:  Vitals Value Taken Time  BP 155/90 07/06/2018 12:31 PM  Temp    Pulse 70 07/06/2018 12:32 PM  Resp 27 07/06/2018 12:32 PM  SpO2 98 % 07/06/2018 12:32 PM  Vitals shown include unvalidated device data.  Last Pain:  Vitals:   07/06/18 0814  TempSrc: Oral  PainSc:          Complications: No apparent anesthesia complications

## 2018-07-06 NOTE — Anesthesia Procedure Notes (Signed)
Procedure Name: Intubation Date/Time: 07/06/2018 11:13 AM Performed by: Trinna Post., CRNA Pre-anesthesia Checklist: Patient identified, Emergency Drugs available, Suction available, Patient being monitored and Timeout performed Patient Re-evaluated:Patient Re-evaluated prior to induction Oxygen Delivery Method: Circle system utilized Preoxygenation: Pre-oxygenation with 100% oxygen Induction Type: IV induction Tube type: Oral (Placed in trach site) Tube size: 5.5 mm Number of attempts: 1 Secured at: 16 cm Tube secured with: Tape Comments: 5.5 cuffed ETT passed directly into trach site, secured at 16cm

## 2018-07-06 NOTE — Discharge Instructions (Signed)
[facility] °Office Phone Number [phone] ° °POST OP INSTRUCTIONS °Take 400 mg of ibuprofen every 8 hours or 650 mg tylenol every 6 hours for next 72 hours then as needed. Use ice several times daily also. °Always review your discharge instruction sheet given to you by the facility where your surgery was performed. ° °IF YOU HAVE DISABILITY OR FAMILY LEAVE FORMS, YOU MUST BRING THEM TO THE OFFICE FOR PROCESSING.  DO NOT GIVE THEM TO YOUR DOCTOR. ° °1. A prescription for pain medication may be given to you upon discharge.  Take your pain medication as prescribed, if needed.  If narcotic pain medicine is not needed, then you may take acetaminophen (Tylenol), naprosyn (Alleve) or ibuprofen (Advil) as needed. °2. Take your usually prescribed medications unless otherwise directed °3. If you need a refill on your pain medication, please contact your pharmacy.  They will contact our office to request authorization.  Prescriptions will not be filled after 5pm or on week-ends. °4. You should eat very light the first 24 hours after surgery, such as soup, crackers, pudding, etc.  Resume your normal diet the day after surgery. °5. Most patients will experience some swelling and bruising in the breast.  Ice packs and a good support bra will help.  Wear the breast binder provided or a sports bra for 72 hours day and night.  After that wear a sports bra during the day until you return to the office. Swelling and bruising can take several days to resolve.  °6. It is common to experience some constipation if taking pain medication after surgery.  Increasing fluid intake and taking a stool softener will usually help or prevent this problem from occurring.  A mild laxative (Milk of Magnesia or Miralax) should be taken according to package directions if there are no bowel movements after 48 hours. °7. Unless discharge instructions indicate otherwise, you may remove your bandages 48 hours after surgery and you may  shower at that time.  You may have steri-strips (small skin tapes) in place directly over the incision.  These strips should be left on the skin for 7-10 days and will come off on their own.  If your surgeon used skin glue on the incision, you may shower in 24 hours.  The glue will flake off over the next 2-3 weeks.  Any sutures or staples will be removed at the office during your follow-up visit. °8. ACTIVITIES:  You may resume regular daily activities (gradually increasing) beginning the next day.  Wearing a good support bra or sports bra minimizes pain and swelling.  You may have sexual intercourse when it is comfortable. °a. You may drive when you no longer are taking prescription pain medication, you can comfortably wear a seatbelt, and you can safely maneuver your car and apply brakes. °b. RETURN TO WORK:  ______________________________________________________________________________________ °9. You should see your doctor in the office for a follow-up appointment approximately two weeks after your surgery.  Your doctor’s nurse will typically make your follow-up appointment when she calls you with your pathology report.  Expect your pathology report 3-4 business days after your surgery.  You may call to check if you do not hear from us after three days. °10. OTHER INSTRUCTIONS: _______________________________________________________________________________________________ _____________________________________________________________________________________________________________________________________ °_____________________________________________________________________________________________________________________________________ °_____________________________________________________________________________________________________________________________________ ° °WHEN TO CALL DR Jonty Morrical: °1. Fever over 101.0 °2. Nausea and/or vomiting. °3. Extreme swelling or bruising. °4. Continued bleeding from  incision. °5. Increased pain, redness, or drainage from the incision. ° °The clinic staff is available to   answer your questions during regular business hours.  Please don’t hesitate to call and ask to speak to one of the nurses for clinical concerns.  If you have a medical emergency, go to the nearest emergency room or call 911.  A surgeon from [facility] is always on call at the hospital. ° °For further questions, please visit [email or web address] ° °

## 2018-07-06 NOTE — Anesthesia Postprocedure Evaluation (Signed)
Anesthesia Post Note  Patient: Deborah [name]  Procedure(s) Performed: RIGHT BREAST LUMPECTOMY WITH RADIOACTIVE SEED LOCALIZATION (Right Breast)     Patient location during evaluation: PACU Anesthesia Type: General Level of consciousness: awake and alert Pain management: pain level controlled Vital Signs Assessment: post-procedure vital signs reviewed and stable Respiratory status: spontaneous breathing, nonlabored ventilation, respiratory function stable and patient connected to nasal cannula oxygen Cardiovascular status: blood pressure returned to baseline and stable Postop Assessment: no apparent nausea or vomiting Anesthetic complications: no    Last Vitals:  Vitals:   07/06/18 1345 07/06/18 1416  BP: 139/85 (!) 169/100  Pulse: 68 67  Resp: 15 18  Temp:  37 C  SpO2: 94% 98%    Last Pain:  Vitals:   07/06/18 1416  TempSrc: Oral  PainSc:                  Barnet Glasgow

## 2018-07-06 NOTE — Op Note (Signed)
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 Serita Grammes Anesthesia: General Specimens: Right breast tissue marked with paint Additional medial, inferior, posterior, anterior margins marked short stitch superior, long stitch lateral, double stitch deep Complications: None Drains: None Estimated blood loss: Minimal Special count was correct at completion Disposition to recovery stable  Indications: This is a 63 year old female with a history of laryngeal cancer who underwent a screening mammogram with right breast calcifications.  This is ductal carcinoma in situ on core biopsy.  We discussed her options and elected to proceed with a seed guided lumpectomy.  The clip had migrated away and the calcifications were used to mark the area with the seed.  I discussed her with ENT prior to beginning as she has a permanent tracheostomy.  She was seen by anesthesia preop as well.  Procedure: After informed consent was obtained the patient was taken to the operating room.  I confirmed placement of the seed first.  She was given antibiotics.  SCDs were in place.  She was placed under general anesthesia without complication.  She was prepped and draped in the standard sterile surgical fashion.  A surgical timeout was then performed.  The seed was in the superior breast.  I have treated Marcaine in a periareolar superior region.  I then made a periareolar incision to hide the scar later.  I tunneled to the seed using retractors.  I then remove the seed and the surrounding tissue with an attempt to get a clear margin.  I then painted this and passed this off the table.  Mammogram confirmed removal of the seed.  I did elect to remove the additional margins as this tissue did not feel completely normal.  The posterior margin is now the muscle.  These margins were marked as above with  silk sutures.  I then obtained the stasis.  The breast was closed with 2-0 Vicryl.  The skin was closed with 3-0 Vicryl and 5-0 Monocryl.  Glue and Steri-Strips were placed.  A binder was placed.  She tolerated this well was extubated and transferred to the recovery in stable condition.  Due to her body habitus and permanent tracheostomy she is going to remain overnight for monitoring.

## 2018-07-06 NOTE — Interval H&P Note (Signed)
History and Physical Interval Note:  07/06/2018 9:56 AM  Deborah [name]  has presented today for surgery, with the diagnosis of right breast dcis  The various methods of treatment have been discussed with the patient and family. After consideration of risks, benefits and other options for treatment, the patient has consented to  Procedure(s): RIGHT BREAST LUMPECTOMY WITH RADIOACTIVE SEED LOCALIZATION (Right) as a surgical intervention .  The patient's history has been reviewed, patient examined, no change in status, stable for surgery.  I have reviewed the patient's chart and labs.  Questions were answered to the patient's satisfaction.     Rolm Bookbinder

## 2018-07-07 ENCOUNTER — Other Ambulatory Visit: Payer: Self-pay

## 2018-07-07 ENCOUNTER — Encounter (HOSPITAL_COMMUNITY): Payer: Self-pay | Admitting: General Surgery

## 2018-07-07 ENCOUNTER — Encounter (HOSPITAL_COMMUNITY): Payer: Self-pay

## 2018-07-07 DIAGNOSIS — D0511 Intraductal carcinoma in situ of right breast: Secondary | ICD-10-CM | POA: Diagnosis not present

## 2018-07-07 DIAGNOSIS — K222 Esophageal obstruction: Secondary | ICD-10-CM

## 2018-07-07 MED ORDER — TRAMADOL HCL 50 MG PO TABS: 50.0000 mg | ORAL_TABLET | Freq: Four times a day (QID) | ORAL | 0 refills | Status: DC | PRN

## 2018-07-07 NOTE — Plan of Care (Signed)
  Problem: Education: Goal: Knowledge of General Education information will improve Description: Including pain rating scale, medication(s)/side effects and non-pharmacologic comfort measures Outcome: Progressing   Problem: Clinical Measurements: Goal: Respiratory complications will improve Outcome: Progressing   Problem: Activity: Goal: Risk for activity intolerance will decrease Outcome: Progressing   

## 2018-07-07 NOTE — Discharge Summary (Signed)
Physician Discharge Summary  Patient ID: Deborah [name] MRN: [id number] DOB/AGE: 61-May-1957 61 y.o.  Admit date: 07/06/2018 Discharge date: 07/07/2018  Admission Diagnoses: History of laryngeal cancer Breast cancer  Discharge Diagnoses:  Active Problems:   Breast cancer [facility])   Discharged Condition: good  Hospital Course: 89 yof underwent seed guided right breast lumpectomy for dcis. Did well, remained overnight for monitoring.  Discharged follwiing day.  Consults: None  Significant Diagnostic Studies: none  Treatments: surgery: right breast seed guided lumpectomy  Discharge Exam: Blood pressure 123/72, pulse 73, temperature 97.7 F (36.5 C), temperature source Oral, resp. rate 16, height 5' 5.5" (1.664 m), weight 101 kg, SpO2 98 %. Incision/Wound:right breast incision clean, no hematoma, no swelling  Disposition: Discharge disposition: 01-Home or Self Care        Allergies as of 07/07/2018      Reactions   Lisinopril Cough      Medication List    STOP taking these medications   Wrist Brace Misc     TAKE these medications   albuterol 108 (90 Base) MCG/ACT inhaler Commonly known as:  PROVENTIL HFA;VENTOLIN HFA INHALE 2 PUFFS BY MOUTH EVERY 4 HOURS AS NEEDED FOR WHEEZING OR SHORTNESS OF BREATH What changed:  See the new instructions.   amLODipine 10 MG tablet Commonly known as:  NORVASC TAKE 1 TABLET BY MOUTH ONCE DAILY   buPROPion 150 MG 24 hr tablet Commonly known as:  WELLBUTRIN XL Take 1 tablet (150 mg total) by mouth daily.   levothyroxine 175 MCG tablet Commonly known as:  SYNTHROID, LEVOTHROID TAKE 1 TABLET BY MOUTH ONCE DAILY BEFORE BREAKFAST What changed:  See the new instructions.   losartan 100 MG tablet Commonly known as:  COZAAR TAKE 1 TABLET BY MOUTH ONCE DAILY   metoprolol tartrate 50 MG tablet Commonly known as:  LOPRESSOR TAKE 1 TABLET BY MOUTH TWICE DAILY   mometasone-formoterol 200-5 MCG/ACT Aero Commonly known as:   DULERA Inhale 2 puffs into the lungs 2 (two) times daily.   nystatin ointment Commonly known as:  MYCOSTATIN Apply 1 application topically 2 (two) times daily as needed (for rash).   omeprazole 40 MG capsule Commonly known as:  PRILOSEC Take 40 mg by mouth 2 (two) times daily.   traMADol 50 MG tablet Commonly known as:  ULTRAM Take 1 tablet (50 mg total) by mouth every 6 (six) hours as needed.   TRANSDERM-SCOP (1.5 MG) 1 MG/3DAYS Generic drug:  scopolamine PLACE 1 PATCH ONTO THE SKIN EVERY 3 DAYS AS NEEDED What changed:    how much to take  how to take this  when to take this  reasons to take this  additional instructions      Follow-up Information    Rolm Bookbinder, MD In 3 weeks.   Specialty:  General Surgery Contact information: [address] [place] [address] [phone]           Signed: Rolm Bookbinder 07/07/2018, 9:17 AM

## 2018-07-08 NOTE — Progress Notes (Signed)
Patient Care Team: Bufford Lope, DO as PCP - General (Family Medicine) [name], MD as Attending Physician (Otolaryngology) Kyung Rudd, MD (Radiation Oncology) Heath Lark, MD as Consulting Physician (Hematology and Oncology) Rolm Bookbinder, MD as Consulting Physician (General Surgery) Nicholas Lose, MD as Consulting Physician (Hematology and Oncology) Eppie Gibson, MD as Attending Physician (Radiation Oncology)  DIAGNOSIS:    ICD-10-CM   1. Ductal carcinoma in situ (DCIS) of right breast D05.11     SUMMARY OF ONCOLOGIC HISTORY:   Ductal carcinoma in situ (DCIS) of right breast   05/26/2018 Initial Diagnosis    History of laryngeal cancer treated with chemoradiation, screening detected right breast calcifications medial right breast measuring 1.9 cm, biopsy revealed high-grade DCIS with comedonecrosis and calcifications ER 90%, PR 90%, Tis NX stage 0    06/02/2018 Cancer Staging    Staging form: Breast, AJCC 8th Edition - Clinical: Stage 0 (cTis (DCIS), cN0, cM0, ER+, PR+, HER2: Not Assessed) - Signed by Nicholas Lose, MD on 06/02/2018    07/06/2018 Surgery    Right lumpectomy: No residual carcinoma identified, right medial margin excision: DCIS high-grade 1.5 cm margins negative, additional margins benign, ER 90%, PR 90%, Tis NX stage 0      CHIEF COMPLIANT: Follow-up s/p lumpectomy to review pathology report  INTERVAL HISTORY: Deborah [name] is a 63 y.o. with above-mentioned history of DCIS of the right breast. She had a lumpectomy on 07/06/18 for which pathology confirmed that the DCIS was removed with clean margins, stage 0, ER 90%, PR 90%. She presents to the clinic alone today. She is recovering well and denies any pain at the surgical site and has not taken any pain medication. She will return on 07/27/18 to meet with Dr. Isidore Moos and begin radiation.   REVIEW OF SYSTEMS:   Constitutional: Denies fevers, chills or abnormal weight loss Eyes: Denies blurriness  of vision Ears, nose, mouth, throat, and face: Denies mucositis or sore throat Respiratory: Denies cough, dyspnea or wheezes Cardiovascular: Denies palpitation, chest discomfort Gastrointestinal:  Denies nausea, heartburn or change in bowel habits Skin: Denies abnormal skin rashes Lymphatics: Denies new lymphadenopathy or easy bruising Neurological: Denies numbness, tingling or new weaknesses Behavioral/Psych: Mood is stable, no new changes  Extremities: No lower extremity edema Breast: denies any pain or lumps or nodules in either breasts All other systems were reviewed with the patient and are negative.  I have reviewed the past medical history, past surgical history, social history and family history with the patient and they are unchanged from previous note.  ALLERGIES:  is allergic to lisinopril.  MEDICATIONS:  Current Outpatient Medications  Medication Sig Dispense Refill  . albuterol (PROVENTIL HFA;VENTOLIN HFA) 108 (90 Base) MCG/ACT inhaler INHALE 2 PUFFS BY MOUTH EVERY 4 HOURS AS NEEDED FOR WHEEZING OR SHORTNESS OF BREATH (Patient taking differently: Inhale 2 puffs into the lungs every 4 (four) hours as needed for wheezing or shortness of breath. ) 2 each 3  . amLODipine (NORVASC) 10 MG tablet TAKE 1 TABLET BY MOUTH ONCE DAILY (Patient taking differently: Take 10 mg by mouth daily. ) 90 tablet 3  . buPROPion (WELLBUTRIN XL) 150 MG 24 hr tablet Take 1 tablet (150 mg total) by mouth daily. 90 tablet 3  . furosemide (LASIX) 20 MG tablet Take 1 tablet (20 mg total) by mouth as needed for edema. 90 tablet 0  . levothyroxine (SYNTHROID, LEVOTHROID) 175 MCG tablet TAKE 1 TABLET BY MOUTH ONCE DAILY BEFORE BREAKFAST (Patient taking differently: Take  175 mcg by mouth daily before breakfast. ) 30 tablet 0  . losartan (COZAAR) 100 MG tablet TAKE 1 TABLET BY MOUTH ONCE DAILY (Patient taking differently: Take 100 mg by mouth daily. ) 30 tablet 0  . metoprolol tartrate (LOPRESSOR) 50 MG tablet  TAKE 1 TABLET BY MOUTH TWICE DAILY (Patient taking differently: Take 50 mg by mouth 2 (two) times daily. ) 60 tablet 2  . mometasone-formoterol (DULERA) 200-5 MCG/ACT AERO Inhale 2 puffs into the lungs 2 (two) times daily. 13 g 2  . nystatin ointment (MYCOSTATIN) Apply 1 application topically 2 (two) times daily as needed (for rash).    [name] omeprazole (PRILOSEC) 40 MG capsule Take 40 mg by mouth 2 (two) times daily.     . traMADol (ULTRAM) 50 MG tablet Take 1 tablet (50 mg total) by mouth every 6 (six) hours as needed. 10 tablet 0  . TRANSDERM-SCOP, 1.5 MG, 1 MG/3DAYS PLACE 1 PATCH ONTO THE SKIN EVERY 3 DAYS AS NEEDED (Patient taking differently: Place 1 patch onto the skin every three (3) days as needed (for mucus). ) 10 patch 2   No current facility-administered medications for this visit.     PHYSICAL EXAMINATION: ECOG PERFORMANCE STATUS: 0 - Asymptomatic  Vitals:   07/13/18 1431  BP: 110/63  Pulse: 73  Resp: 17  Temp: 98.3 F (36.8 C)  SpO2: 97%   Filed Weights   07/13/18 1431  Weight: 219 lb 6.4 oz (99.5 kg)    GENERAL: alert, no distress and comfortable SKIN: skin color, texture, turgor are normal, no rashes or significant lesions EYES: normal, Conjunctiva are pink and non-injected, sclera clear OROPHARYNX: no exudate, no erythema and lips, buccal mucosa, and tongue normal  NECK: supple, thyroid normal size, non-tender, without nodularity LYMPH: no palpable lymphadenopathy in the cervical, axillary or inguinal LUNGS: clear to auscultation and percussion with normal breathing effort HEART: regular rate & rhythm and no murmurs and no lower extremity edema ABDOMEN: abdomen soft, non-tender and normal bowel sounds MUSCULOSKELETAL: no cyanosis of digits and no clubbing  NEURO: alert & oriented x 3 with fluent speech, no focal motor/sensory deficits EXTREMITIES: No lower extremity edema  LABORATORY DATA:  I have reviewed the data as listed CMP Latest Ref Rng & Units 06/30/2018  06/02/2018 04/19/2018  Glucose 70 - 99 mg/dL 122(H) 90 105(H)  BUN 6 - 20 mg/dL _0 Creatinine 0.44 - 1.00 mg/dL 0.91 1.07(H) 0.83  Sodium 135 - 145 mmol/L 140 142 139  Potassium 3.5 - 5.1 mmol/L 3.6 3.6 3.8  Chloride 98 - 111 mmol/L 106 108 113(H)  CO2 22 - 32 mmol/L _1 Calcium 8.9 - 10.3 mg/dL 9.4 9.0 8.7(L)  Total Protein 6.5 - 8.1 g/dL - 7.9 -  Total Bilirubin 0.3 - 1.2 mg/dL - 0.2(L) -  Alkaline Phos 38 - 126 U/L - 86 -  AST 15 - 41 U/L - 16 -  ALT 0 - 44 U/L - 17 -    Lab Results  Component Value Date   WBC 6.6 06/30/2018   HGB 11.5 (L) 06/30/2018   HCT 37.1 06/30/2018   MCV 94.6 06/30/2018   PLT 376 06/30/2018   NEUTROABS 2.6 06/02/2018    ASSESSMENT & PLAN:  Ductal carcinoma in situ (DCIS) of right breast 07/05/2018: Right lumpectomy: No residual carcinoma identified, right medial margin excision: DCIS high-grade 1.5 cm margins negative, additional margins benign, ER 90%, PR 90%, Tis NX stage 0  Pathology counseling: I discussed  the final pathology report of the patient provided  a copy of this report. I discussed the margins as well as lymph node surgeries. We also discussed the final staging along with previously performed ER/PR testing.   Treatment plan: 1. adjuvant radiation therapy 2. Followed by antiestrogen therapy with tamoxifen 5 years  Follow-up after radiation is complete we will start tamoxifen.      No orders of the defined types were placed in this encounter.  The patient has a good understanding of the overall plan. she agrees with it. she will call with any problems that may develop before the next visit here.  Nicholas Lose, MD 07/13/2018  Julious Oka Dorshimer am acting as scribe for Dr. Nicholas Lose.  I have reviewed the above documentation for accuracy and completeness, and I agree with the above.

## 2018-07-09 ENCOUNTER — Telehealth: Payer: Self-pay

## 2018-07-09 NOTE — Telephone Encounter (Signed)
Patient left message that she was to call PCP if she had any weight gain and she reports a weight gain of 4 pounds in last two days.  Call back is [phone].  Danley Danker, RN [facility] RN)

## 2018-07-12 NOTE — Telephone Encounter (Signed)
Talked with patient, cards PA called her in lasix. She is doing well and has no complaints.

## 2018-07-13 ENCOUNTER — Inpatient Hospital Stay: Payer: Medicaid Other | Attending: Hematology and Oncology | Admitting: Hematology and Oncology

## 2018-07-13 DIAGNOSIS — Z9221 Personal history of antineoplastic chemotherapy: Secondary | ICD-10-CM | POA: Diagnosis not present

## 2018-07-13 DIAGNOSIS — Z79899 Other long term (current) drug therapy: Secondary | ICD-10-CM | POA: Insufficient documentation

## 2018-07-13 DIAGNOSIS — Z8521 Personal history of malignant neoplasm of larynx: Secondary | ICD-10-CM | POA: Diagnosis not present

## 2018-07-13 DIAGNOSIS — I10 Essential (primary) hypertension: Secondary | ICD-10-CM | POA: Insufficient documentation

## 2018-07-13 DIAGNOSIS — Z170 Estrogen receptor positive status [ER+]: Secondary | ICD-10-CM | POA: Diagnosis not present

## 2018-07-13 DIAGNOSIS — D0511 Intraductal carcinoma in situ of right breast: Secondary | ICD-10-CM | POA: Diagnosis not present

## 2018-07-13 DIAGNOSIS — K219 Gastro-esophageal reflux disease without esophagitis: Secondary | ICD-10-CM | POA: Diagnosis not present

## 2018-07-13 DIAGNOSIS — Z923 Personal history of irradiation: Secondary | ICD-10-CM | POA: Insufficient documentation

## 2018-07-13 NOTE — Assessment & Plan Note (Signed)
07/05/2018: Right lumpectomy: No residual carcinoma identified, right medial margin excision: DCIS high-grade 1.5 cm margins negative, additional margins benign, ER 90%, PR 90%, Tis NX stage 0  Pathology counseling: I discussed the final pathology report of the patient provided  a copy of this report. I discussed the margins as well as lymph node surgeries. We also discussed the final staging along with previously performed ER/PR testing.   Treatment plan: 1. adjuvant radiation therapy 2. Followed by antiestrogen therapy with tamoxifen 5 years  Follow-up after radiation is complete we will start tamoxifen.

## 2018-07-14 ENCOUNTER — Other Ambulatory Visit: Payer: Self-pay | Admitting: Family Medicine

## 2018-07-14 ENCOUNTER — Encounter: Payer: Self-pay | Admitting: Sports Medicine

## 2018-07-14 ENCOUNTER — Other Ambulatory Visit: Payer: Self-pay | Admitting: Internal Medicine

## 2018-07-14 ENCOUNTER — Other Ambulatory Visit: Payer: Self-pay | Admitting: Physician Assistant

## 2018-07-14 ENCOUNTER — Ambulatory Visit (INDEPENDENT_AMBULATORY_CARE_PROVIDER_SITE_OTHER): Payer: Medicaid Other | Admitting: Sports Medicine

## 2018-07-14 VITALS — BP 100/63 | Ht 67.0 in | Wt 202.0 lb

## 2018-07-14 DIAGNOSIS — E039 Hypothyroidism, unspecified: Secondary | ICD-10-CM

## 2018-07-14 DIAGNOSIS — M170 Bilateral primary osteoarthritis of knee: Secondary | ICD-10-CM | POA: Diagnosis not present

## 2018-07-14 MED ORDER — MELOXICAM 15 MG PO TABS: 15.0000 mg | ORAL_TABLET | Freq: Every day | ORAL | 1 refills | Status: DC

## 2018-07-14 NOTE — Progress Notes (Addendum)
PCP: Bufford Lope, DO  Subjective:   HPI: Patient is a 63 y.o. female here for bilateral knee pain follow up. Patient was last seen on 04/21/2018 at that time she was having bilateral knee pain consistent with OA, which was confirmed on X-rays ordered that day. She was given bilateral steroid injection of her knees, provided a knee brace for her right knee, and given a prescription for Meloxicam (which she states she did not pick up). She states that her pain remain about the same as before (9/10 with walking) although her left knee hurts more than her right today. The knee injection relieved her pain for only about 2 weeks, then her pain began to return. She has been using her knee brace, though she does not feel it has made much of a difference. She has been taking tylenol PRN. She endorses new intermittent radiation of the pain in her left knee to her ankle and new lateral left knee swelling.   BP 100/63   Ht 5\' 7"  (1.702 m)   Wt 202 lb (91.6 kg)   BMI 31.64 kg/m   Review of Systems: See HPI above.     Objective:  Physical Exam:  Gen: awake, alert, NAD, comfortable in exam room Pulm: breathing unlabored  Left Knee: - Inspection: Mild-Moderate Anterolateral and Mediolateral effusion deformity. No erythema or bruising. Skin intact - Palpation: Tender to palpation at bilateral joint line - ROM with flexion and extension in knee intact - Strength: 5/5 strength - Neuro/vasc: NV intact - Special Tests: - LIGAMENTS: negative anterior and posterior drawer, no MCL or LCL laxity  - MENISCUS: unable to fully assess, no change in  pain with Thessaly   Right Knee: - Inspection: Trace-Mild Anterolateral and Mediolateral effusion deformity. No erythema or bruising. Skin intact - Palpation: Tender to palpation at bilateral joint line - ROM with flexion and extension in knee intact - Strength: 5/5 strength - Neuro/vasc: NV intact - Special Tests: - LIGAMENTS: negative anterior and posterior  drawer, no MCL or LCL laxity  - MENISCUS: unable to fully assess, no change in  pain with Thessaly     Assessment & Plan:  1. Bilateral Knee Osteoarthritis: Patient has known OA redemonstrated on recent X-rays from 04/22/2018. She has failed steroid injection of her knees with only 2 weeks of relief at her last visit. Discussion about next steps was held with patient, which include anti-inflammatory trial, Hyaluronic  acid injections, or referral for surgery. Patient states she would like to start with trial of antiinflammatories while we await insurance approval for hyaluronic acid injections, will plan to proceed with injections unless significant improvement in pain occurs with NSAIDs alone (unlikely). She would prefer to avoid knee surgery until she has exhausted her other options. - Meloxicam 15mg  Daily x14 days, then PRN, counseled on common and severe side effects of long term NSAID use - Insurance will not cover HA injections, will continue to manage symptomatically with steroid injections, meloxicam, bracing, and activity modification.  Attending/Fellow Addendum:  I have discussed this patient's visit at length and in detail with Dr. Neva Seat and reviewed the following note. I agree with the residents assessment and resulting plan.    Loa Socks, DO [facility] Fellow 07/14/2018 4:44 PM  I was the preceptor for this visit and available for immediate consultation to both the resident and the sports medicine fellow Shellia Cleverly, DO

## 2018-07-15 ENCOUNTER — Other Ambulatory Visit: Payer: Self-pay | Admitting: Internal Medicine

## 2018-07-19 ENCOUNTER — Telehealth: Payer: Self-pay

## 2018-07-19 ENCOUNTER — Other Ambulatory Visit: Payer: Self-pay | Admitting: Internal Medicine

## 2018-07-19 ENCOUNTER — Other Ambulatory Visit: Payer: Self-pay | Admitting: General Surgery

## 2018-07-19 DIAGNOSIS — D0511 Intraductal carcinoma in situ of right breast: Secondary | ICD-10-CM

## 2018-07-19 MED ORDER — MELOXICAM 15 MG PO TABS: ORAL_TABLET | ORAL | 1 refills | Status: DC

## 2018-07-19 NOTE — Telephone Encounter (Signed)
See phone note

## 2018-07-19 NOTE — Telephone Encounter (Signed)
Rx resent. Pt is aware.

## 2018-07-21 ENCOUNTER — Other Ambulatory Visit: Payer: Self-pay | Admitting: General Surgery

## 2018-07-23 ENCOUNTER — Ambulatory Visit
Admission: RE | Admit: 2018-07-23 | Discharge: 2018-07-23 | Disposition: A | Discharge status: Home / Self Care | Payer: Medicaid Other | Source: Ambulatory Visit | Attending: General Surgery | Admitting: General Surgery

## 2018-07-23 DIAGNOSIS — R928 Other abnormal and inconclusive findings on diagnostic imaging of breast: Secondary | ICD-10-CM | POA: Diagnosis not present

## 2018-07-23 DIAGNOSIS — D0511 Intraductal carcinoma in situ of right breast: Secondary | ICD-10-CM

## 2018-07-23 DIAGNOSIS — Z853 Personal history of malignant neoplasm of breast: Secondary | ICD-10-CM | POA: Diagnosis not present

## 2018-07-27 ENCOUNTER — Ambulatory Visit: Payer: Medicaid Other | Admitting: Radiation Oncology

## 2018-07-27 ENCOUNTER — Ambulatory Visit: Payer: Medicaid Other

## 2018-07-28 ENCOUNTER — Other Ambulatory Visit: Payer: Self-pay

## 2018-07-28 ENCOUNTER — Encounter (HOSPITAL_COMMUNITY): Payer: Self-pay | Admitting: *Deleted

## 2018-07-28 NOTE — Anesthesia Preprocedure Evaluation (Addendum)
Anesthesia Evaluation  Patient identified by MRN, date of birth, ID band Patient awake    Reviewed: Allergy & Precautions, H&P , NPO status , Patient's Chart, lab work & pertinent test results, reviewed documented beta blocker date and time   Airway Mallampati: Trach       Dental no notable dental hx. (+) Edentulous Upper, Edentulous Lower, Dental Advisory Given   Pulmonary asthma , COPD,  COPD inhaler, former smoker,    Pulmonary exam normal breath sounds clear to auscultation       Cardiovascular hypertension, Pt. on medications and Pt. on home beta blockers + DOE   Rhythm:Regular Rate:Normal     Neuro/Psych Seizures -, Well Controlled,  Depression    GI/Hepatic Neg liver ROS, GERD  Medicated and Controlled,  Endo/Other  Hypothyroidism   Renal/GU negative Renal ROS  negative genitourinary   Musculoskeletal  (+) Arthritis ,   Abdominal   Peds  Hematology  (+) Blood dyscrasia, anemia ,   Anesthesia Other Findings   Reproductive/Obstetrics negative OB ROS                            Anesthesia Physical Anesthesia Plan  ASA: III  Anesthesia Plan: General   Post-op Pain Management:    Induction: Intravenous  PONV Risk Score and Plan: 4 or greater and Ondansetron, Dexamethasone and Midazolam  Airway Management Planned: Tracheostomy  Additional Equipment:   Intra-op Plan:   Post-operative Plan: Extubation in OR  Informed Consent: I have reviewed the patients History and Physical, chart, labs and discussed the procedure including the risks, benefits and alternatives for the proposed anesthesia with the patient or authorized representative who has indicated his/her understanding and acceptance.     Dental advisory given  Plan Discussed with: CRNA  Anesthesia Plan Comments: (PAT note written 07/28/2018 by Myra Gianotti, PA-C. Has tracheostomy/TEP prosthesis. )        Anesthesia Quick Evaluation

## 2018-07-28 NOTE — Progress Notes (Signed)
Pt denies any acute cardiopulmonary issues and being under the care of a cardiologist. Pt denies having a cardiac cath. Pt made aware to stop taking  Aspirin (unless advised otherwise by surgeon), vitamins, fish oil and herbal medications. Do not take any NSAIDs ie: Ibuprofen, Advil, Naproxen (Aleve), Motrin, Mobic, BC and Goody Powder. Pt verbalized understanding of all pre-op instructions. PA,  Anesthesiology asked to review pt history.

## 2018-07-28 NOTE — Progress Notes (Signed)
Anesthesia Chart Review: SAME DAY WORK-UP   Case:  [id number] Date/Time:  07/29/18 1445   Procedure:  RE-EXCISION OF RIGHT BREAST MEDIAL MARGINS (Right Breast)   Anesthesia type:  General   Pre-op diagnosis:  RIGHT BREAST CANCER   Location:  [facility] OR ROOM 09 / [facility]   Surgeon:  Rolm Bookbinder, MD     * Patient has trach and tracheoesophageal puncture (TEP) prosthesis which is a small plastic or silicone valve that fits into the opening. The valve keeps food out of the trachea. Patients can can cover their stoma with a finger, and force air into the esophagus through the valve.  DISCUSSION: Patient is a 63 year old female scheduled for the above procedure. 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   History includesDCIS right breast (diagnosed 05/26/18, s/p right lumpectomy 07/06/18), HTN, heart murmur, asthma, COPD, hypothyroidism, anemia,laryngeal cancer (s/p laryngectomy and radiation, tracheostomy dependent, s/p stomaplasty 10/21/16, TEP managed at [facility]; s/p esophagoscopy with diltation 12/11/17; s/p right bronchoscopy for retrieval of silastic foreign body and replacement of tracheoesophageal fistula prothesis 01/08/18; s/p esophagoscopy with dilitation 04/19/18), GERD, former smoker (quit 2012). BMI is consistent with obesity.  - She has upcoming EGD with wire-guided dilation at [facility] by Dr. Loletha Carrow on 08/03/18 for cervical esophageal stricture.   I evaluated her prior to 07/06/18 procedure. At that time, she had a soft flexible tracheostomy tube reported as size #8. TEP prosthesis in place (reported TEP performed on 07/2010 and TEP prosthesis placed 02/2011). Patient cannot remove the TEP prosthesis (done by speech therapist or ENT) although on three occasions it has become dislodged--twice with significant coughing and once when a physician tried to re-insert her flexible tracheosotomy tube.   Last  anesthesia record: - 07/06/18: Induction Type: IV induction Tube type: Oral (Placed in trach site) Tube size: 5.5 mm Number of attempts: 1 Comments: 5.5 cuffed ETT passed directly into trach site, secured at 16cm (5.5 mm  tube size used 04/19/18 and 6.5 mm on 01/08/18)  Previous airway for anesthesia via tracheostomy. Patient is familiar as she has had multiple experiences with anesthesia with her trach and TEP prosthesis. Anesthesiologist to evaluate on the day of surgery.    PROVIDERS: Tawny Asal PCPat the [facility] [name], MD is ENT ([place]). Last visit 05/18/18. Nicholas Lose, MD is HEM-ONC. Eppie Gibson, MD is RAD-ONC. Christinia Gully, MD is pulmonologist. Last visit 09/20/17. He felt patient's DOE was upper airway related, as no pulmonary findings to explain symptoms.  [name], MD is cardiologist. Seen by [name], PA 10/01/17 for dyspnea evaluation. PRN f/u recommended after testing (see below). Wray Kearns, MD is GI. Seen on 06/30/18 for dysphagia.   LABS: She is for updated labs on arrival.   IMAGES: Esophogram/barium swallow study 07/02/18: IMPRESSION:  Cervical esophageal stricture just above the tracheostomy tube as described. The remainder of the esophagus appears normal.  1V CXR7/26/19 (in the setting of aspiration of dislodged transesophageal prosthesis; now s/p retrieval): IMPRESSION: Volume loss on the left with diffuse infiltrative change. This may be related to some central mucous plugging. Hyperinflation of the right lung is noted.   OTHER: Walk Tests: 08/21/17 Walked RA x one lap @ 185 stopped due to Sob with nl sats / rapid pace  09/18/17 Walked RA 2 laps @ 185  ft each stopped due to Sob / no desats / slow pace   EKG:01/08/18: SR, LVH,T wave abnormality, consider lateral ischemia.No significant change since last tracing.   CV: Nuclear stress test  10/07/17:  Nuclear stress EF: 61%.  There was no ST segment deviation noted during stress.  Defect 1: There is a medium defect of moderate severity present in the mid anteroseptal and apex location.  The study is normal.  This is a low risk study.  The left ventricular ejection fraction is normal (55-65%). Normal stress nuclear study with breast attenuation but no ischemia; EF 61 with normal wall motion.  Echo 08/31/17: Study Conclusions - Left ventricle: The cavity size was mildly reduced. Wall thickness was increased in a pattern of moderate LVH. Systolic function was vigorous. The estimated ejection fraction was in the range of 65% to 70%. Wall motion was normal; there were no regional wall motion abnormalities. Features are consistent with a pseudonormal left ventricular filling pattern, with concomitant abnormal relaxation and increased filling pressure (grade 2 diastolic dysfunction). - Aortic valve: There was mild regurgitation. - Pulmonary arteries: Systolic pressure was mildly increased. PA peak pressure: 35 mm Hg (S).   Past Medical History:  Diagnosis Date  . Anemia   . Arthritis    in knees  . Asthma   . Breast cancer ([place])   . Bronchitis   . COPD (chronic obstructive pulmonary disease) ([place])   . Diverticulosis 11/15/2012    noted on screening colonoscopy   . Dyspnea    with COPD exerbation  . Esophageal stricture   . Former smoker 03/19/2011  . GERD (gastroesophageal reflux disease)   . Heart murmur    asymptomatic   . History of laryngectomy   . 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 Hypertension   . 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 Internal hemorrhoid 11/15/2012    small, noted on screening colonoscopy   . 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  . Leukocytopenia   . Nausea alone 07/28/2013  . Neck pain 01/21/2012  . Normal MRI 07/14/11   negative for  mestasis   . Pneumonia 2012  . Sciatica   . Seizures ([place])    07/24/11 off Effexor w/o seizure  . Sepsis ([place]) 08/04/12  . Sinusitis, chronic 07/20/2011   Bilateral maxillary, identified on MRI of head 07/14/11.    . Tracheostomy dependent [facility])     Past Surgical History:  Procedure Laterality Date  . BREAST LUMPECTOMY Right 07/06/2018  . BREAST LUMPECTOMY WITH RADIOACTIVE SEED LOCALIZATION Right 07/06/2018   Procedure: RIGHT BREAST LUMPECTOMY WITH RADIOACTIVE SEED LOCALIZATION;  Surgeon: Rolm Bookbinder, MD;  Location: [facility];  Service: General;  Laterality: Right;  . COLONOSCOPY N/A 11/15/2012   Procedure: COLONOSCOPY;  Surgeon: Lafayette Dragon, MD;  Location: [facility];  Service: Endoscopy;  Laterality: N/A;  . DENTAL RESTORATION/EXTRACTION WITH X-RAY    . ESOPHAGOSCOPY  06/21/2012   Procedure: ESOPHAGOSCOPY;  Surgeon: [name], MD;  Location: [facility];  Service: ENT;  Laterality: N/A;  . ESOPHAGOSCOPY WITH DILITATION N/A 09/21/2014   Procedure: ESOPHAGOSCOPY WITH DILITATION;  Surgeon: [name], MD;  Location: [facility];  Service: ENT;  Laterality: N/A;  . ESOPHAGOSCOPY WITH DILITATION N/A 07/04/2016   Procedure: ESOPHAGOSCOPY WITH DILITATION;  Surgeon: [name], MD;  Location: [facility];  Service: ENT;  Laterality: N/A;  . ESOPHAGOSCOPY WITH DILITATION N/A 12/01/2017  Procedure: ESOPHAGOSCOPY WITH DILITATION;  Surgeon: [name], MD;  Location: [facility];  Service: ENT;  Laterality: N/A;  . ESOPHAGOSCOPY WITH DILITATION N/A 04/19/2018   Procedure: ESOPHAGOSCOPY WITH DILITATION;  Surgeon: [name], MD;  Location: [facility];  Service: ENT;  Laterality: N/A;  . FLEXIBLE BRONCHOSCOPY  01/08/2018      . FOREIGN BODY REMOVAL BRONCHIAL  10/02/2011   Procedure: REMOVAL FOREIGN BODY BRONCHIAL;  Surgeon: Ruby Cola, MD;  Location: [facility];  Service: ENT;  Laterality: N/A;  . FOREIGN BODY REMOVAL BRONCHIAL N/A 01/08/2018   Procedure: REMOVAL FOREIGN BODY BRONCHIAL;  Surgeon: Jodi Marble, MD;  Location: [facility];  Service: ENT;  Laterality: N/A;  . LARYNGECTOMY    . Porta cath removal    . PORTACATH PLACEMENT  09/17/10   Tip in cavoatrial junction  . STOMAPLASTY N/A 10/21/2016   Procedure: Zola Button;  Surgeon: [name], MD;  Location: [facility];  Service: ENT;  Laterality: N/A;  . TRACHEAL DILITATION  07/16/2011   Procedure: TRACHEAL DILITATION;  Surgeon: Beckie Salts, MD;  Location: [facility];  Service: ENT;  Laterality: N/A;  dilation of tracheal stoma and replacement of stoma tube  . TUBAL LIGATION  1982    MEDICATIONS: No current facility-administered medications for this encounter.    [name] albuterol (PROVENTIL HFA;VENTOLIN HFA) 108 (90 Base) MCG/ACT inhaler  . amLODipine (NORVASC) 10 MG tablet  . buPROPion (WELLBUTRIN XL) 150 MG 24 hr tablet  . chlorpheniramine (CHLOR-TRIMETON) 4 MG tablet  . DULERA 200-5 MCG/ACT AERO  . furosemide (LASIX) 20 MG tablet  . levothyroxine (SYNTHROID, LEVOTHROID) 175 MCG tablet  . losartan (COZAAR) 100 MG tablet  . meloxicam (MOBIC) 15 MG tablet  . metoprolol tartrate (LOPRESSOR) 50 MG tablet  . nystatin cream (MYCOSTATIN)  . omeprazole (PRILOSEC) 40 MG capsule  . traMADol (ULTRAM) 50 MG tablet  . TRANSDERM-SCOP, 1.5 MG, 1 MG/3DAYS    Myra Gianotti, PA-C Surgical Short Stay/Anesthesiology [facility] Phone [phone] [facility] Phone [phone] 07/28/2018 10:10 AM

## 2018-07-29 ENCOUNTER — Encounter (HOSPITAL_COMMUNITY)
Admission: RE | Disposition: A | Discharge status: Home / Self Care | Payer: Self-pay | Source: Home / Self Care | Attending: General Surgery

## 2018-07-29 ENCOUNTER — Ambulatory Visit (HOSPITAL_COMMUNITY): Payer: Medicaid Other | Admitting: Vascular Surgery

## 2018-07-29 ENCOUNTER — Ambulatory Visit (HOSPITAL_COMMUNITY)
Admission: RE | Admit: 2018-07-29 | Discharge: 2018-07-29 | Disposition: A | Discharge status: Home / Self Care | Payer: Medicaid Other | Attending: General Surgery | Admitting: General Surgery

## 2018-07-29 ENCOUNTER — Encounter (HOSPITAL_COMMUNITY): Payer: Self-pay

## 2018-07-29 DIAGNOSIS — I10 Essential (primary) hypertension: Secondary | ICD-10-CM | POA: Diagnosis not present

## 2018-07-29 DIAGNOSIS — K5790 Diverticulosis of intestine, part unspecified, without perforation or abscess without bleeding: Secondary | ICD-10-CM | POA: Diagnosis not present

## 2018-07-29 DIAGNOSIS — K219 Gastro-esophageal reflux disease without esophagitis: Secondary | ICD-10-CM | POA: Diagnosis not present

## 2018-07-29 DIAGNOSIS — R569 Unspecified convulsions: Secondary | ICD-10-CM | POA: Insufficient documentation

## 2018-07-29 DIAGNOSIS — N641 Fat necrosis of breast: Secondary | ICD-10-CM | POA: Insufficient documentation

## 2018-07-29 DIAGNOSIS — R011 Cardiac murmur, unspecified: Secondary | ICD-10-CM | POA: Insufficient documentation

## 2018-07-29 DIAGNOSIS — Z8249 Family history of ischemic heart disease and other diseases of the circulatory system: Secondary | ICD-10-CM | POA: Insufficient documentation

## 2018-07-29 DIAGNOSIS — F329 Major depressive disorder, single episode, unspecified: Secondary | ICD-10-CM | POA: Insufficient documentation

## 2018-07-29 DIAGNOSIS — D0511 Intraductal carcinoma in situ of right breast: Secondary | ICD-10-CM | POA: Diagnosis not present

## 2018-07-29 DIAGNOSIS — Z170 Estrogen receptor positive status [ER+]: Secondary | ICD-10-CM | POA: Insufficient documentation

## 2018-07-29 DIAGNOSIS — J449 Chronic obstructive pulmonary disease, unspecified: Secondary | ICD-10-CM | POA: Diagnosis not present

## 2018-07-29 DIAGNOSIS — C50911 Malignant neoplasm of unspecified site of right female breast: Secondary | ICD-10-CM | POA: Diagnosis present

## 2018-07-29 DIAGNOSIS — D649 Anemia, unspecified: Secondary | ICD-10-CM | POA: Diagnosis not present

## 2018-07-29 DIAGNOSIS — Z8521 Personal history of malignant neoplasm of larynx: Secondary | ICD-10-CM | POA: Insufficient documentation

## 2018-07-29 DIAGNOSIS — Z87891 Personal history of nicotine dependence: Secondary | ICD-10-CM | POA: Diagnosis not present

## 2018-07-29 DIAGNOSIS — J45909 Unspecified asthma, uncomplicated: Secondary | ICD-10-CM | POA: Insufficient documentation

## 2018-07-29 DIAGNOSIS — E039 Hypothyroidism, unspecified: Secondary | ICD-10-CM | POA: Diagnosis not present

## 2018-07-29 DIAGNOSIS — M1990 Unspecified osteoarthritis, unspecified site: Secondary | ICD-10-CM | POA: Insufficient documentation

## 2018-07-29 HISTORY — PX: RE-EXCISION OF BREAST CANCER,SUPERIOR MARGINS: SHX6047

## 2018-07-29 LAB — BASIC METABOLIC PANEL
Anion gap: 8 (ref 5–15)
BUN: 16 mg/dL (ref 6–20)
CO2: 22 mmol/L (ref 22–32)
CREATININE: 0.84 mg/dL (ref 0.44–1.00)
Calcium: 8.9 mg/dL (ref 8.9–10.3)
Chloride: 110 mmol/L (ref 98–111)
GFR calc Af Amer: >60 mL/min (ref >60)
GFR calc non Af Amer: >60 mL/min (ref >60)
Glucose, Bld: 101 mg/dL — ABNORMAL HIGH (ref 70–99)
Potassium: 3.5 mmol/L (ref 3.5–5.1)
Sodium: 140 mmol/L (ref 135–145)

## 2018-07-29 LAB — CBC
HCT: 36.3 % (ref 36.0–46.0)
Hemoglobin: 11.1 g/dL — ABNORMAL LOW (ref 12.0–15.0)
MCH: 28.5 pg (ref 26.0–34.0)
MCHC: 30.6 g/dL (ref 30.0–36.0)
MCV: 93.3 fL (ref 80.0–100.0)
NRBC: 0 % (ref 0.0–0.2)
Platelets: 292 10*3/uL (ref 150–400)
RBC: 3.89 MIL/uL (ref 3.87–5.11)
RDW: 12.6 % (ref 11.5–15.5)
WBC: 5 10*3/uL (ref 4.0–10.5)

## 2018-07-29 SURGERY — RE-EXCISION OF BREAST CANCER,SUPERIOR MARGINS
Anesthesia: General | Site: Breast | Laterality: Right

## 2018-07-29 MED ORDER — CEFAZOLIN SODIUM-DEXTROSE 2-4 GM/100ML-% IV SOLN
2.0000 g | INTRAVENOUS | Status: AC
  Administered 2018-07-29: 2 g via INTRAVENOUS

## 2018-07-29 MED ORDER — ROCURONIUM BROMIDE 50 MG/5ML IV SOSY
PREFILLED_SYRINGE | INTRAVENOUS | Status: AC
  Filled 2018-07-29: qty 5

## 2018-07-29 MED ORDER — FENTANYL CITRATE (PF) 250 MCG/5ML IJ SOLN
INTRAMUSCULAR | Status: AC
  Filled 2018-07-29: qty 5

## 2018-07-29 MED ORDER — DEXAMETHASONE SODIUM PHOSPHATE 10 MG/ML IJ SOLN
INTRAMUSCULAR | Status: DC | PRN
  Administered 2018-07-29: 6 mg via INTRAVENOUS

## 2018-07-29 MED ORDER — PROPOFOL 10 MG/ML IV BOLUS
INTRAVENOUS | Status: DC | PRN
  Administered 2018-07-29: 200 mg via INTRAVENOUS

## 2018-07-29 MED ORDER — ONDANSETRON HCL 4 MG/2ML IJ SOLN
INTRAMUSCULAR | Status: AC
  Filled 2018-07-29: qty 2

## 2018-07-29 MED ORDER — PHENYLEPHRINE 40 MCG/ML (10ML) SYRINGE FOR IV PUSH (FOR BLOOD PRESSURE SUPPORT)
PREFILLED_SYRINGE | INTRAVENOUS | Status: AC
  Filled 2018-07-29: qty 10

## 2018-07-29 MED ORDER — BUPIVACAINE HCL (PF) 0.25 % IJ SOLN
INTRAMUSCULAR | Status: AC
  Filled 2018-07-29: qty 30

## 2018-07-29 MED ORDER — PROPOFOL 10 MG/ML IV BOLUS
INTRAVENOUS | Status: AC
  Filled 2018-07-29: qty 20

## 2018-07-29 MED ORDER — LACTATED RINGERS IV SOLN
INTRAVENOUS | Status: DC | PRN
  Administered 2018-07-29: 13:00:00 via INTRAVENOUS

## 2018-07-29 MED ORDER — MIDAZOLAM HCL 5 MG/5ML IJ SOLN
INTRAMUSCULAR | Status: DC | PRN
  Administered 2018-07-29: 2 mg via INTRAVENOUS

## 2018-07-29 MED ORDER — MIDAZOLAM HCL 2 MG/2ML IJ SOLN
INTRAMUSCULAR | Status: AC
  Filled 2018-07-29: qty 2

## 2018-07-29 MED ORDER — ACETAMINOPHEN 500 MG PO TABS
1000.0000 mg | ORAL_TABLET | ORAL | Status: AC
  Administered 2018-07-29: 1000 mg via ORAL

## 2018-07-29 MED ORDER — DEXAMETHASONE SODIUM PHOSPHATE 10 MG/ML IJ SOLN
INTRAMUSCULAR | Status: AC
  Filled 2018-07-29: qty 1

## 2018-07-29 MED ORDER — GABAPENTIN 100 MG PO CAPS
ORAL_CAPSULE | ORAL | Status: AC
  Administered 2018-07-29: 100 mg via ORAL
  Filled 2018-07-29: qty 1

## 2018-07-29 MED ORDER — ONDANSETRON HCL 4 MG/2ML IJ SOLN
INTRAMUSCULAR | Status: DC | PRN
  Administered 2018-07-29: 4 mg via INTRAVENOUS

## 2018-07-29 MED ORDER — LACTATED RINGERS IV SOLN
INTRAVENOUS | Status: DC
  Administered 2018-07-29: 13:00:00 via INTRAVENOUS

## 2018-07-29 MED ORDER — LIDOCAINE 2% (20 MG/ML) 5 ML SYRINGE
INTRAMUSCULAR | Status: AC
  Filled 2018-07-29: qty 5

## 2018-07-29 MED ORDER — 0.9 % SODIUM CHLORIDE (POUR BTL) OPTIME
TOPICAL | Status: DC | PRN
  Administered 2018-07-29: 1000 mL

## 2018-07-29 MED ORDER — ACETAMINOPHEN 500 MG PO TABS
ORAL_TABLET | ORAL | Status: AC
  Administered 2018-07-29: 1000 mg via ORAL
  Filled 2018-07-29: qty 2

## 2018-07-29 MED ORDER — LIDOCAINE 2% (20 MG/ML) 5 ML SYRINGE
INTRAMUSCULAR | Status: DC | PRN
  Administered 2018-07-29: 100 mg via INTRAVENOUS

## 2018-07-29 MED ORDER — BUPIVACAINE HCL 0.25 % IJ SOLN
INTRAMUSCULAR | Status: DC | PRN
  Administered 2018-07-29: 10 mL

## 2018-07-29 MED ORDER — GABAPENTIN 100 MG PO CAPS
100.0000 mg | ORAL_CAPSULE | ORAL | Status: AC
  Administered 2018-07-29: 100 mg via ORAL

## 2018-07-29 MED ORDER — ENSURE PRE-SURGERY PO LIQD: 296.0000 mL | Freq: Once | ORAL | Status: DC

## 2018-07-29 MED ORDER — HYDROMORPHONE HCL 1 MG/ML IJ SOLN: 0.2500 mg | INTRAMUSCULAR | Status: DC | PRN

## 2018-07-29 MED ORDER — CEFAZOLIN SODIUM-DEXTROSE 2-4 GM/100ML-% IV SOLN
INTRAVENOUS | Status: AC
  Filled 2018-07-29: qty 100

## 2018-07-29 MED ORDER — PHENYLEPHRINE 40 MCG/ML (10ML) SYRINGE FOR IV PUSH (FOR BLOOD PRESSURE SUPPORT)
PREFILLED_SYRINGE | INTRAVENOUS | Status: DC | PRN
  Administered 2018-07-29: 80 ug via INTRAVENOUS

## 2018-07-29 MED ORDER — FENTANYL CITRATE (PF) 100 MCG/2ML IJ SOLN
INTRAMUSCULAR | Status: DC | PRN
  Administered 2018-07-29 (×3): 50 ug via INTRAVENOUS

## 2018-07-29 MED ORDER — SUCCINYLCHOLINE CHLORIDE 200 MG/10ML IV SOSY
PREFILLED_SYRINGE | INTRAVENOUS | Status: AC
  Filled 2018-07-29: qty 10

## 2018-07-29 SURGICAL SUPPLY — 44 items
BINDER BREAST XLRG (GAUZE/BANDAGES/DRESSINGS) ×2 IMPLANT
CANISTER SUCT 3000ML PPV (MISCELLANEOUS) ×2 IMPLANT
CHLORAPREP W/TINT 26ML (MISCELLANEOUS) ×2 IMPLANT
CLOSURE STERI-STRIP 1/4X4 (GAUZE/BANDAGES/DRESSINGS) ×2 IMPLANT
CONT SPEC 4OZ CLIKSEAL STRL BL (MISCELLANEOUS) ×2 IMPLANT
COVER SURGICAL LIGHT HANDLE (MISCELLANEOUS) ×2 IMPLANT
DECANTER SPIKE VIAL GLASS SM (MISCELLANEOUS) ×2 IMPLANT
DERMABOND ADVANCED (GAUZE/BANDAGES/DRESSINGS) ×1
DERMABOND ADVANCED .7 DNX12 (GAUZE/BANDAGES/DRESSINGS) ×1 IMPLANT
DRAPE LAPAROTOMY 100X72 PEDS (DRAPES) ×2 IMPLANT
DRAPE UTILITY 15X26 TOWEL STRL (DRAPES) ×4 IMPLANT
DRSG OPSITE 4X5.5 SM (GAUZE/BANDAGES/DRESSINGS) IMPLANT
ELECT CAUTERY BLADE 6.4 (BLADE) ×2 IMPLANT
ELECT REM PT RETURN 9FT ADLT (ELECTROSURGICAL) ×2
ELECTRODE REM PT RTRN 9FT ADLT (ELECTROSURGICAL) ×1 IMPLANT
GAUZE SPONGE 4X4 12PLY STRL (GAUZE/BANDAGES/DRESSINGS) IMPLANT
GLOVE BIO SURGEON STRL SZ7 (GLOVE) ×2 IMPLANT
GLOVE BIOGEL PI IND STRL 7.5 (GLOVE) ×1 IMPLANT
GLOVE BIOGEL PI INDICATOR 7.5 (GLOVE) ×1
GOWN STRL REUS W/ TWL LRG LVL3 (GOWN DISPOSABLE) ×3 IMPLANT
GOWN STRL REUS W/TWL LRG LVL3 (GOWN DISPOSABLE) ×3
KIT BASIN OR (CUSTOM PROCEDURE TRAY) ×2 IMPLANT
KIT TURNOVER KIT B (KITS) ×2 IMPLANT
NEEDLE HYPO 25GX1X1/2 BEV (NEEDLE) ×2 IMPLANT
NS IRRIG 1000ML POUR BTL (IV SOLUTION) ×2 IMPLANT
PACK SURGICAL SETUP 50X90 (CUSTOM PROCEDURE TRAY) ×2 IMPLANT
PAD ABD 8X10 STRL (GAUZE/BANDAGES/DRESSINGS) ×2 IMPLANT
PAD ARMBOARD 7.5X6 YLW CONV (MISCELLANEOUS) ×4 IMPLANT
PENCIL BUTTON HOLSTER BLD 10FT (ELECTRODE) ×2 IMPLANT
SPONGE LAP 4X18 RFD (DISPOSABLE) ×2 IMPLANT
STRIP CLOSURE SKIN 1/2X4 (GAUZE/BANDAGES/DRESSINGS) ×2 IMPLANT
SUT MNCRL AB 4-0 PS2 18 (SUTURE) ×2 IMPLANT
SUT MON AB 5-0 PS2 18 (SUTURE) ×2 IMPLANT
SUT SILK 2 0 SH (SUTURE) ×2 IMPLANT
SUT VIC AB 2-0 SH 27 (SUTURE) ×2
SUT VIC AB 2-0 SH 27X BRD (SUTURE) ×2 IMPLANT
SUT VIC AB 3-0 SH 27 (SUTURE) ×1
SUT VIC AB 3-0 SH 27XBRD (SUTURE) ×1 IMPLANT
SYR BULB 3OZ (MISCELLANEOUS) ×2 IMPLANT
SYR CONTROL 10ML LL (SYRINGE) ×2 IMPLANT
TOWEL OR 17X24 6PK STRL BLUE (TOWEL DISPOSABLE) IMPLANT
TOWEL OR 17X26 10 PK STRL BLUE (TOWEL DISPOSABLE) ×2 IMPLANT
TUBE CONNECTING 12X1/4 (SUCTIONS) ×2 IMPLANT
YANKAUER SUCT BULB TIP NO VENT (SUCTIONS) ×2 IMPLANT

## 2018-07-29 NOTE — Anesthesia Postprocedure Evaluation (Signed)
Anesthesia Post Note  Patient: Deborah [name]  Procedure(s) Performed: RE-EXCISION OF RIGHT BREAST MEDIAL MARGINS (Right Breast)     Patient location during evaluation: PACU Anesthesia Type: General Level of consciousness: sedated Pain management: pain level controlled Vital Signs Assessment: post-procedure vital signs reviewed and stable Respiratory status: spontaneous breathing and respiratory function stable Cardiovascular status: stable Postop Assessment: no apparent nausea or vomiting Anesthetic complications: no    Last Vitals:  Vitals:   07/29/18 1543 07/29/18 1550  BP: (!) 166/78   Pulse: 68   Resp: 15   Temp:    SpO2: 91% 96%    Last Pain:  Vitals:   07/29/18 1550  TempSrc:   PainSc: Asleep                 Odysseus Cada DANIEL

## 2018-07-29 NOTE — Transfer of Care (Signed)
Immediate Anesthesia Transfer of Care Note  Patient: Deborah [name]  Procedure(s) Performed: RE-EXCISION OF RIGHT BREAST MEDIAL MARGINS (Right Breast)  Patient Location: PACU  Anesthesia Type:General  Level of Consciousness: drowsy and patient cooperative  Airway & Oxygen Therapy: Patient Spontanous Breathing and Patient connected to tracheostomy mask oxygen  Post-op Assessment: Report given to RN, Post -op Vital signs reviewed and stable and Patient moving all extremities X 4  Post vital signs: Reviewed and stable  Last Vitals:  Vitals Value Taken Time  BP 141/70 07/29/2018  3:13 PM  Temp    Pulse 70 07/29/2018  3:13 PM  Resp 17 07/29/2018  3:13 PM  SpO2 94 % 07/29/2018  3:13 PM  Vitals shown include unvalidated device data.  Last Pain:  Vitals:   07/29/18 1315  TempSrc:   PainSc: 0-No pain         Complications: No apparent anesthesia complications

## 2018-07-29 NOTE — Op Note (Signed)
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 Serita Grammes Anesthesia: General Specimens: Right breast medial margin short superior, long lateral, double deep Complications: None Drains: None Estimated blood loss: Minimal Special count was correct at completion Disposition to recovery stable  Indications: This is a 63 year old female with a history of laryngeal cancer who underwent a screening mammogram with right breast calcifications.  This is ductal carcinoma in situ on core biopsy.  We discussed her options and elected to proceed with a seed guided lumpectomy.  The clip had migrated away and the calcifications were used to mark the area with the seed.  I discussed her with ENT prior to beginning as she has a permanent tracheostomy.  She underwent excision with a very close medial margin and I discussed reexcision.   Procedure: After informed consent was obtained the patient was taken to the operating room.  She was given antibiotics.  SCDs were in place.  She was placed under general anesthesia without complication.  She was prepped and draped in the standard sterile surgical fashion.  A surgical timeout was then performed.  I have infiltrated Marcaine in a periareolar superior region and I reentered her old incision. I released the old sutures. I then removed the medial margin and marked as above.   I then obtained hemostasis.  The breast was closed with 2-0 Vicryl.  The skin was closed with 3-0 Vicryl and 5-0 Monocryl.  Glue and Steri-Strips were placed.  A binder was placed.  She tolerated this well was extubated and transferred to the recovery in stable condition

## 2018-07-29 NOTE — Discharge Instructions (Signed)
[facility] °Office Phone Number [phone] ° °POST OP INSTRUCTIONS °Take 400 mg of ibuprofen every 8 hours or 650 mg tylenol every 6 hours for next 72 hours then as needed. Use ice several times daily also. °Always review your discharge instruction sheet given to you by the facility where your surgery was performed. ° °IF YOU HAVE DISABILITY OR FAMILY LEAVE FORMS, YOU MUST BRING THEM TO THE OFFICE FOR PROCESSING.  DO NOT GIVE THEM TO YOUR DOCTOR. ° °1. A prescription for pain medication may be given to you upon discharge.  Take your pain medication as prescribed, if needed.  If narcotic pain medicine is not needed, then you may take acetaminophen (Tylenol), naprosyn (Alleve) or ibuprofen (Advil) as needed. °2. Take your usually prescribed medications unless otherwise directed °3. If you need a refill on your pain medication, please contact your pharmacy.  They will contact our office to request authorization.  Prescriptions will not be filled after 5pm or on week-ends. °4. You should eat very light the first 24 hours after surgery, such as soup, crackers, pudding, etc.  Resume your normal diet the day after surgery. °5. Most patients will experience some swelling and bruising in the breast.  Ice packs and a good support bra will help.  Wear the breast binder provided or a sports bra for 72 hours day and night.  After that wear a sports bra during the day until you return to the office. Swelling and bruising can take several days to resolve.  °6. It is common to experience some constipation if taking pain medication after surgery.  Increasing fluid intake and taking a stool softener will usually help or prevent this problem from occurring.  A mild laxative (Milk of Magnesia or Miralax) should be taken according to package directions if there are no bowel movements after 48 hours. °7. Unless discharge instructions indicate otherwise, you may remove your bandages 48 hours after surgery and you may  shower at that time.  You may have steri-strips (small skin tapes) in place directly over the incision.  These strips should be left on the skin for 7-10 days and will come off on their own.  If your surgeon used skin glue on the incision, you may shower in 24 hours.  The glue will flake off over the next 2-3 weeks.  Any sutures or staples will be removed at the office during your follow-up visit. °8. ACTIVITIES:  You may resume regular daily activities (gradually increasing) beginning the next day.  Wearing a good support bra or sports bra minimizes pain and swelling.  You may have sexual intercourse when it is comfortable. °a. You may drive when you no longer are taking prescription pain medication, you can comfortably wear a seatbelt, and you can safely maneuver your car and apply brakes. °b. RETURN TO WORK:  ______________________________________________________________________________________ °9. You should see your doctor in the office for a follow-up appointment approximately two weeks after your surgery.  Your doctor’s nurse will typically make your follow-up appointment when she calls you with your pathology report.  Expect your pathology report 3-4 business days after your surgery.  You may call to check if you do not hear from us after three days. °10. OTHER INSTRUCTIONS: _______________________________________________________________________________________________ _____________________________________________________________________________________________________________________________________ °_____________________________________________________________________________________________________________________________________ °_____________________________________________________________________________________________________________________________________ ° °WHEN TO CALL DR Osualdo Hansell: °1. Fever over 101.0 °2. Nausea and/or vomiting. °3. Extreme swelling or bruising. °4. Continued bleeding from  incision. °5. Increased pain, redness, or drainage from the incision. ° °The clinic staff is available to   answer your questions during regular business hours.  Please don’t hesitate to call and ask to speak to one of the nurses for clinical concerns.  If you have a medical emergency, go to the nearest emergency room or call 911.  A surgeon from [facility] is always on call at the hospital. ° °For further questions, please visit [email or web address] ° °

## 2018-07-29 NOTE — Interval H&P Note (Signed)
History and Physical Interval Note:  07/29/2018 1:47 PM  Deborah [name]  has presented today for surgery, with the diagnosis of RIGHT BREAST CANCER  The various methods of treatment have been discussed with the patient and family. After consideration of risks, benefits and other options for treatment, the patient has consented to  Procedure(s): RE-EXCISION OF RIGHT BREAST MEDIAL MARGINS (Right) as a surgical intervention .  The patient's history has been reviewed, patient examined, no change in status, stable for surgery.  I have reviewed the patient's chart and labs.  Questions were answered to the patient's satisfaction.     Rolm Bookbinder

## 2018-07-29 NOTE — Anesthesia Procedure Notes (Signed)
Procedure Name: Intubation Date/Time: 07/29/2018 2:30 PM Performed by: Orlie Dakin, CRNA Pre-anesthesia Checklist: Patient identified, Emergency Drugs available, Suction available and Patient being monitored Patient Re-evaluated:Patient Re-evaluated prior to induction Oxygen Delivery Method: Circle system utilized Preoxygenation: Pre-oxygenation with 100% oxygen Induction Type: IV induction Tube type: Reinforced Tube size: 5.5 mm Number of attempts: 1 Airway Equipment and Method: Tracheostomy Placement Confirmation: positive ETCO2 Tube secured with: Tape Dental Injury: Teeth and Oropharynx as per pre-operative assessment  Comments:  After induction, reinforced ETT, lubricated and passed gently via trach stoma, and as noted above.

## 2018-07-30 ENCOUNTER — Encounter (HOSPITAL_COMMUNITY): Payer: Self-pay | Admitting: General Surgery

## 2018-08-02 ENCOUNTER — Encounter (HOSPITAL_COMMUNITY): Payer: Self-pay | Admitting: Certified Registered"

## 2018-08-02 ENCOUNTER — Encounter (HOSPITAL_COMMUNITY): Payer: Self-pay | Admitting: *Deleted

## 2018-08-02 ENCOUNTER — Telehealth: Payer: Self-pay | Admitting: Gastroenterology

## 2018-08-02 ENCOUNTER — Other Ambulatory Visit: Payer: Self-pay

## 2018-08-02 NOTE — Telephone Encounter (Signed)
Dr Loletha Carrow I see where you ordered the EGD with fluoro. Do you want balloon dil or savory. Please advise.

## 2018-08-02 NOTE — Telephone Encounter (Signed)
Deborah [name] has been notified and aware.

## 2018-08-02 NOTE — Telephone Encounter (Signed)
Bethany from [facility] called with a question about the pt procd that is sched for tomorrow. She is needing a call back to verify is the Endo dilation.She is wanting to know if it will be savory or Balloon? If Azzie Almas will flouro be needed?

## 2018-08-02 NOTE — Telephone Encounter (Signed)
Savary dilation and yes, fluoroscopy needed

## 2018-08-03 ENCOUNTER — Other Ambulatory Visit: Payer: Self-pay

## 2018-08-03 ENCOUNTER — Encounter (HOSPITAL_COMMUNITY)
Admission: RE | Disposition: A | Discharge status: Home / Self Care | Payer: Self-pay | Source: Home / Self Care | Attending: Gastroenterology

## 2018-08-03 DIAGNOSIS — Z8589 Personal history of malignant neoplasm of other organs and systems: Secondary | ICD-10-CM

## 2018-08-03 DIAGNOSIS — R1314 Dysphagia, pharyngoesophageal phase: Secondary | ICD-10-CM

## 2018-08-03 DIAGNOSIS — K222 Esophageal obstruction: Secondary | ICD-10-CM

## 2018-08-03 SURGERY — ESOPHAGOGASTRODUODENOSCOPY (EGD) WITH PROPOFOL
Anesthesia: Monitor Anesthesia Care

## 2018-08-03 NOTE — Progress Notes (Signed)
Anesthesia cancelled pt today. They wanted this case done at [facility]. Patient called to let them know there procedure is cancelled for today. Dr. Loletha Carrow' office will contact her to reschedule.

## 2018-08-11 ENCOUNTER — Encounter (HOSPITAL_COMMUNITY): Payer: Self-pay | Admitting: *Deleted

## 2018-08-11 ENCOUNTER — Other Ambulatory Visit: Payer: Self-pay

## 2018-08-11 NOTE — Progress Notes (Signed)
Pt denies any acute cardiopulmonary issues and being under the care of a cardiologist. Pt denies having a cardiac cath. Pt made aware to stop taking  Aspirin (unless advised otherwise by surgeon), vitamins, fish oil and herbal medications. Do not take any NSAIDs ie: Ibuprofen, Advil, Naproxen (Aleve), Motrin, Mobic, BC and Goody Powder. Pt made aware to brush her teeth the morning of procedure with regular toothpaste. Pt verbalized understanding of all pre-op instructions.

## 2018-08-11 NOTE — Progress Notes (Signed)
Location of Breast Cancer: Right Breast  Histology per Pathology Report:  05/26/18 Diagnosis Breast, right, needle core biopsy, superior medial - DUCTAL CARCINOMA IN SITU, HIGH NUCLEAR GRADE WITH CENTRAL NECROSIS AND CALCIFICATIONS. Receptor Status: ER(90%), PR (90%)  07/06/18 Diagnosis 1. Breast, lumpectomy, Right - MICROGLANDULAR ADENOSIS AND FIBROCYSTIC CHANGES WITH CALCIFICATIONS - NO RESIDUAL CARCINOMA IDENTIFIED - SEE COMMENT 2. Breast, excision, Right Medial Margin - DUCTAL CARCINOMA IN SITU, HIGH GRADE - MARGINS UNINVOLVED BY CARCINOMA (FOCALLY LESS THAN 0.1 CM) - PREVIOUS BIOPSY SITE CHANGES - SEE ONCOLOGY TABLE AND COMMENT BELOW 3. Breast, excision, Right Inferior Margin - USUAL DUCTAL HYPERPLASIA - NO RESIDUAL CARCINOMA IDENTIFIED 4. Breast, excision, Right Posterior Margin - BENIGN BREAST TISSUE - NO RESIDUAL CARCINOMA IDENTIFIED 5. Breast, excision, Right Anterior Margin - BENIGN BREAST TISSUE - NO RESIDUAL CARCINOMA IDENTIFIED  07/29/18 Diagnosis Breast, excision, Right Medial Margin - BENIGN BREAST PARENCHYMA WITH FAT NECROSIS. - HEALING BIOPSY SITE. - THERE IS NO EVIDENCE OF MALIGNANCY.  Did patient present with symptoms or was this found on screening mammography?: It was found on a screening mammogram.   Past/Anticipated interventions by surgeon, if any: 07/06/18 Procedure: Right breast radioactive seed guided lumpectomy Surgeon: Dr. Serita Grammes  07/29/18 Procedure: Right breast re-excision of medial margin Surgeon: Dr. Serita Grammes  Past/Anticipated interventions by medical oncology, if any: 07/13/18 Dr. Lindi Adie Treatment plan: 1. adjuvant radiation therapy 2. Followed by antiestrogen therapy with tamoxifen 5 years  Follow-up after radiation is complete we will start tamoxifen.   Lymphedema issues, if any: She denies. She has good arm mobility.   Pain issues, if any:  She denies.   SAFETY ISSUES:  Prior radiation? Yes, 09/03/10-  10/16/10  Supraglottic Larynx radiation.   Pacemaker/ICD? No  Possible current pregnancy? No  Is the patient on methotrexate? No  Current Complaints / other details:    BP 116/71 (BP Location: Right Arm, Patient Position: Sitting)   Pulse 82   Temp 98.3 F (36.8 C) (Oral)   Resp 20   Ht 5\' 7"  (1.702 m)   Wt 225 lb 3.2 oz (102.2 kg)   SpO2 98%   BMI 35.27 kg/m    Wt Readings from Last 3 Encounters:  08/17/18 225 lb 3.2 oz (102.2 kg)  08/12/18 220 lb (99.8 kg)  07/29/18 220 lb (99.8 kg)      Curtis Cain, Stephani Police, RN 08/11/2018,10:06 AM

## 2018-08-12 ENCOUNTER — Ambulatory Visit (HOSPITAL_COMMUNITY)
Admission: RE | Admit: 2018-08-12 | Discharge: 2018-08-12 | Disposition: A | Discharge status: Home / Self Care | Payer: Medicaid Other | Attending: Gastroenterology | Admitting: Gastroenterology

## 2018-08-12 ENCOUNTER — Ambulatory Visit (HOSPITAL_COMMUNITY): Payer: Medicaid Other | Admitting: Anesthesiology

## 2018-08-12 ENCOUNTER — Other Ambulatory Visit: Payer: Self-pay | Admitting: Family Medicine

## 2018-08-12 ENCOUNTER — Encounter (HOSPITAL_COMMUNITY)
Admission: RE | Disposition: A | Discharge status: Home / Self Care | Payer: Self-pay | Source: Home / Self Care | Attending: Gastroenterology

## 2018-08-12 ENCOUNTER — Other Ambulatory Visit: Payer: Self-pay

## 2018-08-12 ENCOUNTER — Other Ambulatory Visit: Payer: Self-pay | Admitting: Internal Medicine

## 2018-08-12 ENCOUNTER — Encounter (HOSPITAL_COMMUNITY): Payer: Self-pay | Admitting: *Deleted

## 2018-08-12 DIAGNOSIS — M170 Bilateral primary osteoarthritis of knee: Secondary | ICD-10-CM | POA: Diagnosis not present

## 2018-08-12 DIAGNOSIS — Z8521 Personal history of malignant neoplasm of larynx: Secondary | ICD-10-CM | POA: Diagnosis not present

## 2018-08-12 DIAGNOSIS — D649 Anemia, unspecified: Secondary | ICD-10-CM | POA: Insufficient documentation

## 2018-08-12 DIAGNOSIS — R011 Cardiac murmur, unspecified: Secondary | ICD-10-CM | POA: Diagnosis not present

## 2018-08-12 DIAGNOSIS — Z963 Presence of artificial larynx: Secondary | ICD-10-CM

## 2018-08-12 DIAGNOSIS — Z930 Tracheostomy status: Secondary | ICD-10-CM | POA: Insufficient documentation

## 2018-08-12 DIAGNOSIS — R1314 Dysphagia, pharyngoesophageal phase: Secondary | ICD-10-CM

## 2018-08-12 DIAGNOSIS — K219 Gastro-esophageal reflux disease without esophagitis: Secondary | ICD-10-CM | POA: Insufficient documentation

## 2018-08-12 DIAGNOSIS — J449 Chronic obstructive pulmonary disease, unspecified: Secondary | ICD-10-CM | POA: Insufficient documentation

## 2018-08-12 DIAGNOSIS — E039 Hypothyroidism, unspecified: Secondary | ICD-10-CM | POA: Diagnosis not present

## 2018-08-12 DIAGNOSIS — Z853 Personal history of malignant neoplasm of breast: Secondary | ICD-10-CM | POA: Insufficient documentation

## 2018-08-12 DIAGNOSIS — M5430 Sciatica, unspecified side: Secondary | ICD-10-CM | POA: Diagnosis not present

## 2018-08-12 DIAGNOSIS — J45909 Unspecified asthma, uncomplicated: Secondary | ICD-10-CM | POA: Diagnosis not present

## 2018-08-12 DIAGNOSIS — R569 Unspecified convulsions: Secondary | ICD-10-CM | POA: Insufficient documentation

## 2018-08-12 DIAGNOSIS — Z923 Personal history of irradiation: Secondary | ICD-10-CM | POA: Diagnosis not present

## 2018-08-12 DIAGNOSIS — R1313 Dysphagia, pharyngeal phase: Secondary | ICD-10-CM | POA: Diagnosis not present

## 2018-08-12 DIAGNOSIS — K5730 Diverticulosis of large intestine without perforation or abscess without bleeding: Secondary | ICD-10-CM | POA: Diagnosis not present

## 2018-08-12 DIAGNOSIS — Z8589 Personal history of malignant neoplasm of other organs and systems: Secondary | ICD-10-CM

## 2018-08-12 DIAGNOSIS — Z9689 Presence of other specified functional implants: Secondary | ICD-10-CM | POA: Diagnosis not present

## 2018-08-12 DIAGNOSIS — I10 Essential (primary) hypertension: Secondary | ICD-10-CM | POA: Insufficient documentation

## 2018-08-12 DIAGNOSIS — K222 Esophageal obstruction: Secondary | ICD-10-CM | POA: Diagnosis not present

## 2018-08-12 DIAGNOSIS — Z9002 Acquired absence of larynx: Secondary | ICD-10-CM | POA: Insufficient documentation

## 2018-08-12 DIAGNOSIS — Z87891 Personal history of nicotine dependence: Secondary | ICD-10-CM | POA: Diagnosis not present

## 2018-08-12 DIAGNOSIS — Z888 Allergy status to other drugs, medicaments and biological substances status: Secondary | ICD-10-CM | POA: Diagnosis not present

## 2018-08-12 HISTORY — PX: ESOPHAGOGASTRODUODENOSCOPY (EGD) WITH PROPOFOL: SHX5813

## 2018-08-12 SURGERY — ESOPHAGOGASTRODUODENOSCOPY (EGD) WITH PROPOFOL
Anesthesia: General

## 2018-08-12 MED ORDER — SUCCINYLCHOLINE CHLORIDE 20 MG/ML IJ SOLN
INTRAMUSCULAR | Status: DC | PRN
  Administered 2018-08-12: 120 mg via INTRAVENOUS

## 2018-08-12 MED ORDER — SODIUM CHLORIDE 0.9 % IV SOLN: INTRAVENOUS | Status: DC

## 2018-08-12 MED ORDER — LIDOCAINE 2% (20 MG/ML) 5 ML SYRINGE
INTRAMUSCULAR | Status: DC | PRN
  Administered 2018-08-12: 100 mg via INTRAVENOUS

## 2018-08-12 MED ORDER — FENTANYL CITRATE (PF) 250 MCG/5ML IJ SOLN
INTRAMUSCULAR | Status: DC | PRN
  Administered 2018-08-12: 100 ug via INTRAVENOUS

## 2018-08-12 MED ORDER — MIDAZOLAM HCL 2 MG/2ML IJ SOLN
INTRAMUSCULAR | Status: DC | PRN
  Administered 2018-08-12: 1 mg via INTRAVENOUS

## 2018-08-12 MED ORDER — FENTANYL CITRATE (PF) 100 MCG/2ML IJ SOLN
INTRAMUSCULAR | Status: AC
  Filled 2018-08-12: qty 2

## 2018-08-12 MED ORDER — LACTATED RINGERS IV SOLN
INTRAVENOUS | Status: AC | PRN
  Administered 2018-08-12: 12:00:00 via INTRAVENOUS
  Administered 2018-08-12: 1000 mL via INTRAVENOUS

## 2018-08-12 MED ORDER — PROPOFOL 10 MG/ML IV BOLUS
INTRAVENOUS | Status: DC | PRN
  Administered 2018-08-12: 120 mg via INTRAVENOUS

## 2018-08-12 MED ORDER — PHENYLEPHRINE 40 MCG/ML (10ML) SYRINGE FOR IV PUSH (FOR BLOOD PRESSURE SUPPORT)
PREFILLED_SYRINGE | INTRAVENOUS | Status: DC | PRN
  Administered 2018-08-12: 40 ug via INTRAVENOUS

## 2018-08-12 MED ORDER — PROPOFOL 10 MG/ML IV BOLUS
INTRAVENOUS | Status: AC
  Filled 2018-08-12: qty 20

## 2018-08-12 MED ORDER — MIDAZOLAM HCL 2 MG/2ML IJ SOLN
INTRAMUSCULAR | Status: AC
  Filled 2018-08-12: qty 2

## 2018-08-12 SURGICAL SUPPLY — 14 items

## 2018-08-12 NOTE — H&P (Signed)
History:  This patient presents for endoscopic testing for dysphagia, esophageal stricture.  Reatha Harps Referring physician: Bufford Lope, DO  Past Medical History: Past Medical History:  Diagnosis Date  . Anemia   . Arthritis    in knees  . Asthma   . Breast cancer ([place])   . Bronchitis   . COPD (chronic obstructive pulmonary disease) ([place])   . Diverticulosis 11/15/2012    noted on screening colonoscopy   . Dyspnea    with COPD exerbation  . Esophageal stricture   . Former smoker 03/19/2011  . GERD (gastroesophageal reflux disease)   . Heart murmur    asymptomatic   . History of laryngectomy   . 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 Hypertension   . 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 Internal hemorrhoid 11/15/2012    small, noted on screening colonoscopy   . 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  . Leukocytopenia   . Nausea alone 07/28/2013  . Neck pain 01/21/2012  . Normal MRI 07/14/11   negative for mestasis   . Pneumonia 2012  . Sciatica   . Seizures ([place])    07/24/11 off Effexor w/o seizure  . Sepsis ([place]) 08/04/12  . Sinusitis, chronic 07/20/2011   Bilateral maxillary, identified on MRI of head 07/14/11.    . Tracheostomy dependent [facility])      Past Surgical History: Past Surgical History:  Procedure Laterality Date  . BREAST LUMPECTOMY Right 07/06/2018  . BREAST LUMPECTOMY WITH RADIOACTIVE SEED LOCALIZATION Right 07/06/2018   Procedure: RIGHT BREAST LUMPECTOMY WITH RADIOACTIVE SEED LOCALIZATION;  Surgeon: Rolm Bookbinder, MD;  Location: [place];  Service: General;  Laterality: Right;  . COLONOSCOPY N/A 11/15/2012   Procedure: COLONOSCOPY;  Surgeon: Lafayette Dragon, MD;  Location: [facility];  Service: Endoscopy;  Laterality: N/A;  . DENTAL RESTORATION/EXTRACTION WITH X-RAY    . ESOPHAGOSCOPY  06/21/2012   Procedure: ESOPHAGOSCOPY;  Surgeon: [name], MD;  Location: [place];  Service: ENT;  Laterality: N/A;  . ESOPHAGOSCOPY WITH DILITATION N/A 09/21/2014   Procedure: ESOPHAGOSCOPY WITH DILITATION;  Surgeon: [name], MD;  Location: [place];  Service: ENT;  Laterality: N/A;  . ESOPHAGOSCOPY WITH DILITATION N/A 07/04/2016   Procedure: ESOPHAGOSCOPY WITH DILITATION;  Surgeon: [name], MD;  Location: [facility] OR;  Service: ENT;  Laterality: N/A;  . ESOPHAGOSCOPY WITH DILITATION N/A 12/01/2017   Procedure: ESOPHAGOSCOPY WITH DILITATION;  Surgeon: [name], MD;  Location: [place];  Service: ENT;  Laterality: N/A;  . ESOPHAGOSCOPY WITH DILITATION N/A 04/19/2018   Procedure: ESOPHAGOSCOPY WITH DILITATION;  Surgeon: [name], MD;  Location: [place];  Service: ENT;  Laterality: N/A;  . FLEXIBLE BRONCHOSCOPY  01/08/2018      . FOREIGN BODY REMOVAL BRONCHIAL  10/02/2011   Procedure: REMOVAL FOREIGN BODY BRONCHIAL;  Surgeon: Ruby Cola, MD;  Location: [place];  Service: ENT;  Laterality: N/A;  . FOREIGN BODY REMOVAL BRONCHIAL N/A 01/08/2018   Procedure: REMOVAL FOREIGN BODY BRONCHIAL;  Surgeon: Jodi Marble, MD;  Location: [facility];  Service: ENT;  Laterality: N/A;  . LARYNGECTOMY    . Porta cath removal    . PORTACATH PLACEMENT  09/17/10   Tip in cavoatrial junction  . RE-EXCISION OF BREAST CANCER,SUPERIOR MARGINS Right 07/29/2018   Procedure: RE-EXCISION OF RIGHT BREAST MEDIAL MARGINS;  Surgeon: Rolm Bookbinder, MD;  Location: [place];  Service: General;  Laterality:  Right;  [name] STOMAPLASTY N/A 10/21/2016   Procedure: Zola Button;  Surgeon: [name], MD;  Location: [place];  Service: ENT;  Laterality: N/A;  . TRACHEAL DILITATION  07/16/2011   Procedure: TRACHEAL DILITATION;  Surgeon: Beckie Salts, MD;  Location: [place];  Service: ENT;  Laterality: N/A;  dilation of tracheal stoma and replacement of stoma tube  . TUBAL LIGATION  1982    Allergies: Allergies  Allergen Reactions  . Lisinopril Cough    Outpatient Meds: Current Facility-Administered  Medications  Medication Dose Route Frequency Provider Last Rate Last Dose  . 0.9 %  sodium chloride infusion   Intravenous Continuous Danis, Abbegale Stehle L III, MD      . 0.9 %  sodium chloride infusion   Intravenous Continuous Danis, Estill Cotta III, MD      . fentaNYL (SUBLIMAZE) 100 MCG/2ML injection           . lactated ringers infusion    Continuous PRN Nelida Meuse III, MD   1,000 mL at 08/12/18 1049  . midazolam (VERSED) 2 MG/2ML injection               ___________________________________________________________________ Objective   Exam:  BP 104/64   Temp 98.1 F (36.7 C) (Oral)   Resp 18   Ht _0  (1.702 m)   Wt 99.8 kg   SpO2 96%   BMI 34.46 kg/m  Tracheostomy.  Breathing comfortably.  Coughs with secretions  CV: RRR without murmur, S1/S2, no JVD, no peripheral edema  Resp: clear to auscultation bilaterally, normal RR and effort noted  GI: soft, no tenderness, with active bowel sounds. No guarding or palpable organomegaly noted.  Neuro: awake, alert and oriented x 3. Normal gross motor function and fluent speech  See Jan 2020 office visit note and recent barium swallow study (images personally reviewed) Assessment:  Proximal esophageal stricture, dysphagia  Plan:  EGD and wire-guided dilation with fluoroscopy   Nelida Meuse III

## 2018-08-12 NOTE — Anesthesia Preprocedure Evaluation (Addendum)
Anesthesia Evaluation  Patient identified by MRN, date of birth, ID band Patient awake    Reviewed: Allergy & Precautions, NPO status , Patient's Chart, lab work & pertinent test results, reviewed documented beta blocker date and time   Airway Mallampati: Trach   Neck ROM: Full    Dental   Pulmonary asthma , COPD,  COPD inhaler, former smoker,    Pulmonary exam normal        Cardiovascular hypertension, Pt. on medications and Pt. on home beta blockers negative cardio ROS Normal cardiovascular exam  Stress Test 2019 Nuclear stress EF: 61%. There was no ST segment deviation noted during stress. Defect 1: There is a medium defect of moderate severity present in the mid anteroseptal and apex location. The study is normal. This is a low risk study. The left ventricular ejection fraction is normal (55-65%). Normal stress nuclear study with breast attenuation but no ischemia; EF 61 with normal wall motion.  TTE 2019 EF 65-70%, Grade 2 DD, mild AI   Neuro/Psych Seizures -, Well Controlled,  PSYCHIATRIC DISORDERS Depression    GI/Hepatic Neg liver ROS, GERD  Medicated,  Endo/Other  Hypothyroidism   Renal/GU negative Renal ROS  negative genitourinary   Musculoskeletal  (+) Arthritis ,   Abdominal   Peds  Hematology negative hematology ROS (+)   Anesthesia Other Findings S/p laryngectomy, tracheostomy 2/2 laryngeal cancer, h/o esophageal stricture  Reproductive/Obstetrics negative OB ROS                            Anesthesia Physical Anesthesia Plan  ASA: III  Anesthesia Plan: General   Post-op Pain Management:    Induction: Intravenous  PONV Risk Score and Plan: 3 and Treatment may vary due to age or medical condition, Ondansetron and Dexamethasone  Airway Management Planned: Tracheostomy  Additional Equipment:   Intra-op Plan:   Post-operative Plan: Extubation in  OR  Informed Consent: I have reviewed the patients History and Physical, chart, labs and discussed the procedure including the risks, benefits and alternatives for the proposed anesthesia with the patient or authorized representative who has indicated his/her understanding and acceptance.     Dental advisory given  Plan Discussed with: CRNA and Surgeon  Anesthesia Plan Comments:        Anesthesia Quick Evaluation

## 2018-08-12 NOTE — Discharge Instructions (Signed)
YOU HAD AN ENDOSCOPIC PROCEDURE TODAY: Refer to the procedure report and other information in the discharge instructions given to you for any specific questions about what was found during the examination. If this information does not answer your questions, please call [place] office at [phone] to clarify.  ° °YOU SHOULD EXPECT: Some feelings of bloating in the abdomen. Passage of more gas than usual. Walking can help get rid of the air that was put into your GI tract during the procedure and reduce the bloating. If you had a lower endoscopy (such as a colonoscopy or flexible sigmoidoscopy) you may notice spotting of blood in your stool or on the toilet paper. Some abdominal soreness may be present for a day or two, also. ° °DIET: Your first meal following the procedure should be a light meal and then it is ok to progress to your normal diet. A half-sandwich or bowl of soup is an example of a good first meal. Heavy or fried foods are harder to digest and may make you feel nauseous or bloated. Drink plenty of fluids but you should avoid alcoholic beverages for 24 hours. If you had a esophageal dilation, please see attached instructions for diet.   ° °ACTIVITY: Your care partner should take you home directly after the procedure. You should plan to take it easy, moving slowly for the rest of the day. You can resume normal activity the day after the procedure however YOU SHOULD NOT DRIVE, use power tools, machinery or perform tasks that involve climbing or major physical exertion for 24 hours (because of the sedation medicines used during the test).  ° °SYMPTOMS TO REPORT IMMEDIATELY: °A gastroenterologist can be reached at any hour. Please call [phone]  for any of the following symptoms:  °Following lower endoscopy (colonoscopy, flexible sigmoidoscopy) °Excessive amounts of blood in the stool  °Significant tenderness, worsening of abdominal pains  °Swelling of the abdomen that is new, acute  °Fever of 100° or  higher  °Following upper endoscopy (EGD, EUS, ERCP, esophageal dilation) °Vomiting of blood or coffee ground material  °New, significant abdominal pain  °New, significant chest pain or pain under the shoulder blades  °Painful or persistently difficult swallowing  °New shortness of breath  °Black, tarry-looking or red, bloody stools ° °FOLLOW UP:  °If any biopsies were taken you will be contacted by phone or by letter within the next 1-3 weeks. Call [phone]  if you have not heard about the biopsies in 3 weeks.  °Please also call with any specific questions about appointments or follow up tests. ° °

## 2018-08-12 NOTE — Interval H&P Note (Signed)
History and Physical Interval Note:  08/12/2018 12:11 PM  Deborah [name]  has presented today for surgery, with the diagnosis of Esophageal stricture, Hx of head and neck cancer tt  The various methods of treatment have been discussed with the patient and family. After consideration of risks, benefits and other options for treatment, the patient has consented to  Procedure(s): ESOPHAGOGASTRODUODENOSCOPY (EGD) WITH PROPOFOL (N/A) as a surgical intervention .  The patient's history has been reviewed, patient examined, no change in status, stable for surgery.  I have reviewed the patient's chart and labs.  Questions were answered to the patient's satisfaction.     Nelida Meuse III

## 2018-08-12 NOTE — Anesthesia Procedure Notes (Signed)
Procedure Name: Intubation Date/Time: 08/12/2018 12:26 PM Performed by: Julieta Bellini, CRNA Pre-anesthesia Checklist: Patient identified, Emergency Drugs available, Suction available and Patient being monitored Patient Re-evaluated:Patient Re-evaluated prior to induction Oxygen Delivery Method: Circle system utilized Preoxygenation: Pre-oxygenation with 100% oxygen Induction Type: IV induction Tube type: Reinforced Tube size: 5.5 mm Number of attempts: 1 Placement Confirmation: positive ETCO2 and breath sounds checked- equal and bilateral Tube secured with: Tape Comments: Reinforced tube inserted through stoma after induction

## 2018-08-12 NOTE — Anesthesia Postprocedure Evaluation (Signed)
Anesthesia Post Note  Patient: Deborah [name]  Procedure(s) Performed: ESOPHAGOGASTRODUODENOSCOPY (EGD) WITH PROPOFOL (N/A )     Patient location during evaluation: PACU Anesthesia Type: General Level of consciousness: awake and alert Pain management: pain level controlled Vital Signs Assessment: post-procedure vital signs reviewed and stable Respiratory status: spontaneous breathing, nonlabored ventilation, respiratory function stable and patient connected to nasal cannula oxygen Cardiovascular status: blood pressure returned to baseline and stable Postop Assessment: no apparent nausea or vomiting Anesthetic complications: no    Last Vitals:  Vitals:   08/12/18 1250 08/12/18 1305  BP: 105/61 109/72  Resp: (!) 24 19  Temp: 36.7 C   SpO2: 96% 91%    Last Pain:  Vitals:   08/12/18 1305  TempSrc:   PainSc: 0-No pain                 Baltasar Twilley DAVID

## 2018-08-12 NOTE — Transfer of Care (Signed)
Immediate Anesthesia Transfer of Care Note  Patient: Deborah [name]  Procedure(s) Performed: ESOPHAGOGASTRODUODENOSCOPY (EGD) WITH PROPOFOL (N/A )  Patient Location: Endoscopy Unit  Anesthesia Type:General  Level of Consciousness: drowsy and patient cooperative  Airway & Oxygen Therapy: Patient Spontanous Breathing and Patient connected to tracheostomy mask oxygen  Post-op Assessment: Report given to RN, Post -op Vital signs reviewed and stable and Patient moving all extremities X 4  Post vital signs: Reviewed and stable  Last Vitals:  Vitals Value Taken Time  BP    Temp    Pulse    Resp    SpO2      Last Pain:  Vitals:   08/12/18 1045  TempSrc: Oral  PainSc: 0-No pain         Complications: No apparent anesthesia complications

## 2018-08-12 NOTE — Op Note (Signed)
[facility] Patient Name: Deborah [name] Procedure Date : 08/12/2018 MRN: [id number] Attending MD: Estill Cotta. Loletha Carrow , MD Date of Birth: [birth year] CSN: [id number] Age: 63 Admit Type: Inpatient Procedure:                Upper GI endoscopy Indications:              Pharyngeal phase dysphagia, Priro XRT for laryngeal                            CA, laryngectomy. Prior bougie dilations by ENT                            physician. Recent barium swallow suggests stricture                            and shows 78mm tablet lodged in cervical esophagus Providers:                Mallie Mussel L. Loletha Carrow, MD, Carlyn Reichert, RN, Cherylynn Ridges, Technician, Wyatt Haste Referring MD:             Arville Care, MD Medicines:                General Anesthesia Complications:            No immediate complications. Estimated Blood Loss:     Estimated blood loss: none. Procedure:                Pre-Anesthesia Assessment:                           - Prior to the procedure, a History and Physical                            was performed, and patient medications and                            allergies were reviewed. The patient's tolerance of                            previous anesthesia was also reviewed. The risks                            and benefits of the procedure and the sedation                            options and risks were discussed with the patient.                            All questions were answered, and informed consent                            was obtained. Prior Anticoagulants: The patient has  taken no previous anticoagulant or antiplatelet                            agents. ASA Grade Assessment: III - A patient with                            severe systemic disease. After reviewing the risks                            and benefits, the patient was deemed in                            satisfactory condition to undergo the  procedure.                           After obtaining informed consent, the endoscope was                            passed under direct vision. Throughout the                            procedure, the patient's blood pressure, pulse, and                            oxygen saturations were monitored continuously. The                            [id number]-[id number] ([id number]) Olympus Ultraslim gastroscope                            was introduced through the mouth, and advanced to                            the second part of duodenum. The upper GI endoscopy                            was accomplished without difficulty. The patient                            tolerated the procedure well. 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      An area of extrinsic compression was found in the upper third of the       esophagus at the prosthesis site. The ultra slim 5.50mm EGD scope passed       without resistance. There is no intrinsic esophageal stricture, ulcer or       esophagitis.      The exam of the esophagus was otherwise normal.      The stomach was normal.      The cardia and gastric fundus were normal on retroflexion.      The examined duodenum was normal. 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                           -  Extrinsic compression in the upper third of the                            esophagus.                           - Normal stomach.                           - Normal examined duodenum.                           - No specimens collected.                           This patient's swallowing difficulty is both                            mechanical and dysmotility from post-surgical                            changes to pharyngeo-esophageal anatomy and prior                            radiation. No dilation was performed, as there was                            no stricture, and thus  no expected benefit from                            dilation. Moderate Sedation:      GETA Recommendation:           - Patient has a contact number available for                            emergencies. The signs and symptoms of potential                            delayed complications were discussed with the                            patient. Return to normal activities tomorrow.                            Written discharge instructions were provided to the                            patient.                           - Continue present medications.                           - Return to referring physician. Consider PEG if  patient consistenty unable to manage sufficient                            intake of fluids, nutrition, or medications by oral                            route.                           - Soft diet. Procedure Code(s):        --- Professional ---                           [phone], Esophagogastroduodenoscopy, flexible,                            transoral; diagnostic, including collection of                            specimen(s) by brushing or washing, when performed                            (separate procedure) Diagnosis Code(s):        --- Professional ---                           Z96.3, Presence of artificial larynx                           K22.2, Esophageal obstruction                           R13.13, Dysphagia, pharyngeal phase CPT copyright 2018 [employer]. All rights reserved. The codes documented in this report are preliminary and upon coder review may  be revised to meet current compliance requirements. Keyleigh Manninen L. Loletha Carrow, MD 08/12/2018 12:56:56 PM This report has been signed electronically. Number of Addenda: 0

## 2018-08-13 ENCOUNTER — Telehealth: Payer: Self-pay | Admitting: Family Medicine

## 2018-08-13 DIAGNOSIS — Z9002 Acquired absence of larynx: Secondary | ICD-10-CM | POA: Diagnosis not present

## 2018-08-13 DIAGNOSIS — Z462 Encounter for fitting and adjustment of other devices related to nervous system and special senses: Secondary | ICD-10-CM | POA: Diagnosis not present

## 2018-08-15 ENCOUNTER — Encounter (HOSPITAL_COMMUNITY): Payer: Self-pay | Admitting: Gastroenterology

## 2018-08-17 ENCOUNTER — Ambulatory Visit
Admission: RE | Admit: 2018-08-17 | Discharge: 2018-08-17 | Disposition: A | Discharge status: Home / Self Care | Payer: Medicaid Other | Source: Ambulatory Visit | Attending: Radiation Oncology | Admitting: Radiation Oncology

## 2018-08-17 ENCOUNTER — Encounter: Payer: Self-pay | Admitting: General Practice

## 2018-08-17 ENCOUNTER — Other Ambulatory Visit: Payer: Self-pay

## 2018-08-17 ENCOUNTER — Ambulatory Visit: Admission: RE | Admit: 2018-08-17 | Payer: Medicaid Other | Source: Ambulatory Visit | Admitting: Radiation Oncology

## 2018-08-17 ENCOUNTER — Encounter: Payer: Self-pay | Admitting: Radiation Oncology

## 2018-08-17 VITALS — BP 116/71 | HR 82 | Temp 98.3°F | Resp 20 | Ht 67.0 in | Wt 225.2 lb

## 2018-08-17 DIAGNOSIS — D0511 Intraductal carcinoma in situ of right breast: Secondary | ICD-10-CM | POA: Insufficient documentation

## 2018-08-17 DIAGNOSIS — Z170 Estrogen receptor positive status [ER+]: Secondary | ICD-10-CM | POA: Diagnosis not present

## 2018-08-17 DIAGNOSIS — N6459 Other signs and symptoms in breast: Secondary | ICD-10-CM | POA: Diagnosis not present

## 2018-08-17 DIAGNOSIS — Z791 Long term (current) use of non-steroidal anti-inflammatories (NSAID): Secondary | ICD-10-CM | POA: Diagnosis not present

## 2018-08-17 DIAGNOSIS — C50111 Malignant neoplasm of central portion of right female breast: Secondary | ICD-10-CM

## 2018-08-17 DIAGNOSIS — Z79899 Other long term (current) drug therapy: Secondary | ICD-10-CM | POA: Insufficient documentation

## 2018-08-17 DIAGNOSIS — Z9889 Other specified postprocedural states: Secondary | ICD-10-CM | POA: Diagnosis not present

## 2018-08-17 NOTE — Progress Notes (Signed)
[facility] [facility] Psychosocial Distress Screening Clinical Social Work  Clinical Social Work was referred by distress screening protocol.  The patient scored a 7 on the Psychosocial Distress Thermometer which indicates moderate distress. Clinical Social Worker contacted patient by phone to assess for distress and other psychosocial needs. Unable to reach patient by phone, left VM w information about [place] and how to access resources if needed.    ONCBCN DISTRESS SCREENING 08/17/2018  Screening Type Initial Screening  Distress experienced in past week (1-10) 7  Emotional problem type Adjusting to illness  Physical Problem type Pain;Sleep/insomnia  Referral to support programs     Clinical Social Worker follow up needed: No.  Please reconsult if appropriate  If yes, follow up plan:  Beverely Pace, [place], LCSW Clinical Social Worker Phone:  [phone]

## 2018-08-17 NOTE — Progress Notes (Signed)
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 She has been doing well overall.    Since consultation date, she underwent the following imaging: Diagnostic Mammogram unilateral right on 07/23/2018 showed: Expected postoperative changes in the RIGHT breast. No mammographic evidence for malignancy.    Systemic therapy, if applicable, involved (dates and therapy as follows): Her last appointment with Medical Oncologist, Dr. Lindi Adie was on 07/13/2018 and he recommended adjuvant radiation therapy, followed by antiestrogen therapy with tamoxifen 5 years.   Breast or nodal biopsies, since consultation, involved (dates and results as follows): right breast lumpectomy on 07/06/2018 that showed: microglandular adenosis and fibrocystic changes with calcification. No residual carcinoma identified. Breast, excision, Right Medial Margin with ductal carcinoma in situ, high grade. Margins uninvolved by carcinoma (focally less than 0.1 cm). Previous biopsy site changes. Breast, excision, Right Inferior Margin with usual ductal hyperplasia. No residual carcinoma identified. Breast, excision, Right Posterior Margin with benign breast tissue. No residual  carcinoma identified. Breast, excision, Right Anterior Margin with benign breast tissue. No residual carcinoma identified. ER and PR, 90% positive. pTis, pNX, grade 3 high  On 07/29/2018, she had a right breast excision that showed: Breast, excision, Right Medial Margin with benign breast parenchyma with fat necrosis. Healing biopsy site. There is no evidence of malignancy.   Symptomatically, the patient reports: redness to her surgical site/right breast x 2 weeks, it was inspected by her surgeon with a FNA that was clear, however, she is currently on doxycycline until she follows up with him on Monday, 08/23/2018. Per patient, she was informed that if the infection doesn't heal by then, then she may be admitted to the hospital for IV Abx. Dr Donne Hazel is her surgeon.         ALLERGIES:  is allergic to lisinopril.  Meds: Current Outpatient Medications  Medication Sig Dispense Refill  . amLODipine (NORVASC) 10 MG tablet TAKE 1 TABLET BY MOUTH ONCE DAILY (Patient taking differently: Take 10 mg by mouth daily. ) 90 tablet 3  . buPROPion (WELLBUTRIN XL) 150 MG 24 hr tablet Take 1 tablet (150 mg total) by mouth daily. 90 tablet 3  . chlorpheniramine (CHLOR-TRIMETON) 4 MG tablet Take 4 mg by mouth daily.    [name] doxycycline (VIBRA-TABS) 100 MG tablet Take 100 mg by mouth 2 (two) times daily.    . furosemide (LASIX) 20 MG tablet TAKE 1 TABLET BY MOUTH DAILY AS NEEDED FOR  SWELLING  OR  SHORTNESS  OF  BREATH (Patient taking differently: Take 20 mg by mouth daily as needed for edema. ) 30 tablet 0  . levothyroxine (SYNTHROID, LEVOTHROID) 175 MCG tablet Take 1 tablet (175 mcg total) by mouth daily before breakfast. 30  tablet 3  . losartan (COZAAR) 100 MG tablet Take 1 tablet (100 mg total) by mouth daily. 30 tablet 3  . meloxicam (MOBIC) 15 MG tablet Take 1 tab daily by mouth for 14 days. Then take as needed. (Patient taking differently: Take 15 mg by mouth daily. Take 1 tab daily by mouth for 14 days. Then take  as needed.) 30 tablet 1  . metoprolol tartrate (LOPRESSOR) 50 MG tablet TAKE 1 TABLET BY MOUTH TWICE DAILY (Patient taking differently: Take 50 mg by mouth 2 (two) times daily. ) 60 tablet 2  . mometasone-formoterol (DULERA) 200-5 MCG/ACT AERO Inhale 2 puffs into the lungs 2 (two) times daily. 13 g 3  . nystatin cream (MYCOSTATIN) APPLY 1 APPLICATION TOPICALLY 2 TIMES DAILY AS NEEDED RASH (Patient taking differently: Apply 1 application topically 2 (two) times daily as needed (rash). ) 30 g 0  . omeprazole (PRILOSEC) 40 MG capsule Take 40 mg by mouth 2 (two) times daily.     [name] PROVENTIL HFA 108 (90 Base) MCG/ACT inhaler Inhale 2 puffs into the lungs every 4 (four) hours as needed for wheezing or shortness of breath. 18 g 3  . traMADol (ULTRAM) 50 MG tablet Take 1 tablet (50 mg total) by mouth every 6 (six) hours as needed. 10 tablet 0  . TRANSDERM-SCOP, 1.5 MG, 1 MG/3DAYS PLACE 1 PATCH ONTO THE SKIN EVERY 3 DAYS AS NEEDED (Patient taking differently: Place 1 patch onto the skin every 3 (three) days. PLACE 1 PATCH ONTO THE SKIN EVERY 3 DAYS AS NEEDED) 10 patch 2   No current facility-administered medications for this encounter.     Physical Findings:  height is '5\' 7"'$  (1.702 m) and weight is 225 lb 3.2 oz (102.2 kg). Her oral temperature is 98.3 F (36.8 C). Her blood pressure is 116/71 and her pulse is 82. Her respiration is 20 and oxygen saturation is 98%. .     General: Alert and oriented, in no acute distress HEENT: Head is normocephalic. Trach intact.  Heart: Regular in rate and rhythm with no murmurs, rubs, or gallops. Chest: Clear to auscultation bilaterally, with no rhonchi, wheezes, or rales. Psychiatric: Judgment and insight are intact.  Breast exam reveals Erythema in the UIQ of right breast. Significant seroma in this area. There is no excessive warmth in this area. Mild swelling of right breast overall.   Lab Findings: Lab Results  Component Value Date   WBC 5.0 07/29/2018    HGB 11.1 (L) 07/29/2018   HCT 36.3 07/29/2018   MCV 93.3 07/29/2018   PLT 292 07/29/2018     Radiographic Findings: Mm Diag Breast Tomo Uni Right  Result Date: 07/23/2018 CLINICAL DATA:  Status post RIGHT lumpectomy for ductal carcinoma in situ in 07/06/2018.Evaluate for residual calcifications. The MEDIAL margin is uninvolved by carcinoma but focally less than 0.1 centimeters. EXAM: DIGITAL DIAGNOSTIC UNILATERAL RIGHT MAMMOGRAM WITH CAD AND TOMO COMPARISON:  07/06/2018 and earlier ACR Breast Density Category b: There are scattered areas of fibroglandular density. FINDINGS: Postoperative changes are identified in the [place] of the RIGHT breast. Magnified views are performed of the lumpectomy bed, demonstrating no residual calcifications to indicate ductal carcinoma in situ. Mammographic images were processed with CAD. IMPRESSION: Expected postoperative changes in the RIGHT breast. No mammographic evidence for malignancy. RECOMMENDATION: Patient is scheduled for re-excision of surgical margin next week. Diagnostic mammogram is recommended in December 2020. I have discussed the findings and recommendations with the patient. Results were also provided in  writing at the conclusion of the visit. If applicable, a reminder letter will be sent to the patient regarding the next appointment. BI-RADS CATEGORY  2: Benign. Electronically Signed   By: Nolon Nations M.D.   On: 07/23/2018 15:56    Impression/Plan: We discussed adjuvant radiotherapy today.  I recommend 4 weeks of radiotherapy in order to reduce her risks of locoregional recurrence by 1/2.  The risks, benefits and side effects of this treatment were discussed in detail.  She understands that radiotherapy is associated with skin irritation and fatigue in the acute setting. Late effects can include cosmetic changes and rare injury to internal organs.   She is agreeable about proceeding with treatment. A consent form has been signed and  placed in her chart.  A total of 3 medically necessary complex treatment devices will be fabricated and supervised by me: 2 fields with MLCs for custom blocks to protect heart, and lungs;  and, a Vac-lok. MORE COMPLEX DEVICES MAY BE MADE IN DOSIMETRY FOR FIELD IN FIELD BEAMS FOR DOSE HOMOGENEITY.  I have requested : 3D Simulation which is medically necessary to give adequate dose to at risk tissues while sparing lungs and heart.  I have requested a DVH of the following structures: lungs, heart, right lumpectomy cavity.    The patient will receive 40.05 Gy in 15 fractions to the right breast with 2 fields.  This will be followed by a boost.  However, due to the patients' ongoing possible infection to right breast, the patient will have her CT simulation planning appointment in 2 weeks on 09/03/2018 instead of today, 08/17/2018. Continue follow up with Dr. Donne Hazel.    I spent 20 minutes  face to face with the patient and more than 50% of that time was spent in counseling and/or coordination of care.  _____________________________________   Eppie Gibson, MD  This document serves as a record of services personally performed by Eppie Gibson, MD. It was created on her behalf by Steva Colder, a trained medical scribe. The creation of this record is based on the scribe's personal observations and the provider's statements to them. This document has been checked and approved by the attending provider.

## 2018-08-23 ENCOUNTER — Other Ambulatory Visit: Payer: Self-pay | Admitting: General Surgery

## 2018-08-23 DIAGNOSIS — N61 Mastitis without abscess: Secondary | ICD-10-CM | POA: Diagnosis not present

## 2018-08-23 DIAGNOSIS — N611 Abscess of the breast and nipple: Secondary | ICD-10-CM

## 2018-08-26 ENCOUNTER — Other Ambulatory Visit: Payer: Self-pay

## 2018-08-26 ENCOUNTER — Ambulatory Visit
Admission: RE | Admit: 2018-08-26 | Discharge: 2018-08-26 | Disposition: A | Discharge status: Home / Self Care | Payer: Medicaid Other | Source: Ambulatory Visit | Attending: General Surgery | Admitting: General Surgery

## 2018-08-26 DIAGNOSIS — N611 Abscess of the breast and nipple: Secondary | ICD-10-CM

## 2018-08-26 DIAGNOSIS — N6489 Other specified disorders of breast: Secondary | ICD-10-CM | POA: Diagnosis not present

## 2018-09-03 ENCOUNTER — Other Ambulatory Visit: Payer: Self-pay

## 2018-09-03 ENCOUNTER — Other Ambulatory Visit: Payer: Self-pay | Admitting: Radiation Oncology

## 2018-09-03 ENCOUNTER — Ambulatory Visit
Admission: RE | Admit: 2018-09-03 | Discharge: 2018-09-03 | Disposition: A | Discharge status: Home / Self Care | Payer: Medicaid Other | Source: Ambulatory Visit | Attending: Radiation Oncology | Admitting: Radiation Oncology

## 2018-09-03 DIAGNOSIS — D0511 Intraductal carcinoma in situ of right breast: Secondary | ICD-10-CM

## 2018-09-03 DIAGNOSIS — D0591 Unspecified type of carcinoma in situ of right breast: Secondary | ICD-10-CM | POA: Insufficient documentation

## 2018-09-03 DIAGNOSIS — Z510 Encounter for antineoplastic radiation therapy: Secondary | ICD-10-CM | POA: Diagnosis not present

## 2018-09-03 NOTE — Progress Notes (Signed)
  Radiation Oncology         [phone]) [phone] ________________________________  Name: Deborah [name] MRN: [id number]  Date: 09/03/2018  DOB: 10-19-1955  SIMULATION AND TREATMENT PLANNING NOTE    outpatient  DIAGNOSIS:     ICD-10-CM   1. Ductal carcinoma in situ (DCIS) of right breast D05.11     NARRATIVE:  The patient was brought to the [facility].  Identity was confirmed.  All relevant records and images related to the planned course of therapy were reviewed.  The patient freely provided informed written consent to proceed with treatment after reviewing the details related to the planned course of therapy. The consent form was witnessed and verified by the simulation staff.    Then, the patient was set-up in a stable reproducible supine position for radiation therapy with her ipsilateral arm over her head, and her upper body secured in a custom-made Vac-lok device.  CT images were obtained.  Surface markings were placed.  The CT images were loaded into the planning software.    TREATMENT PLANNING NOTE: Treatment planning then occurred.  The radiation prescription was entered and confirmed.     A total of 3 medically necessary complex treatment devices were fabricated and supervised by me: 2 fields with MLCs for custom blocks to protect heart, and lungs;  and, a Vac-lok. MORE COMPLEX DEVICES MAY BE MADE IN DOSIMETRY FOR FIELD IN FIELD BEAMS FOR DOSE HOMOGENEITY.  I have requested : 3D Simulation which is medically necessary to give adequate dose to at risk tissues while sparing lungs and heart.  I have requested a DVH of the following structures: lungs, heart, right lumpectomy cavity.    The patient will receive 42.56 Gy in 16 fractions to the right breast with 2 tangential fields.  This will not be followed by a boost.  Given the coronavirus pandemic, I asked medical oncology to start her on antiestrogen medication now.  I recommend delaying radiotherapy because I am  concerned that the virus could be devastating for this patient.  Tentatively we will start radiation therapy in about 5 weeks but we can push this back further depending on how the pandemic evolves.  I discussed this plan with the patient and she is agreeable  Optical Surface Tracking Plan:  Since intensity modulated radiotherapy (IMRT) and 3D conformal radiation treatment methods are predicated on accurate and precise positioning for treatment, intrafraction motion monitoring is medically necessary to ensure accurate and safe treatment delivery. The ability to quantify intrafraction motion without excessive ionizing radiation dose can only be performed with optical surface tracking. Accordingly, surface imaging offers the opportunity to obtain 3D measurements of patient position throughout IMRT and 3D treatments without excessive radiation exposure. I am ordering optical surface tracking for this patient's upcoming course of radiotherapy.  ________________________________   Reference:  Ursula Alert, J, et al. Surface imaging-based analysis of intrafraction motion for breast radiotherapy patients.Journal of [place], n. 6, nov. 2014. ISSN [id number].  Available at: <http://www.jacmp.org/index.php/jacmp/article/view/4957>.    -----------------------------------  Eppie Gibson, MD

## 2018-09-06 ENCOUNTER — Other Ambulatory Visit: Payer: Self-pay | Admitting: Hematology and Oncology

## 2018-09-06 MED ORDER — ANASTROZOLE 1 MG PO TABS: 1.0000 mg | ORAL_TABLET | Freq: Every day | ORAL | 3 refills | Status: DC

## 2018-09-06 NOTE — Progress Notes (Signed)
I called in a prescription for anastrozole until radiation can be started. I left a voicemail for the patient.

## 2018-09-07 ENCOUNTER — Other Ambulatory Visit: Payer: Self-pay | Admitting: Radiation Oncology

## 2018-09-07 DIAGNOSIS — D0511 Intraductal carcinoma in situ of right breast: Secondary | ICD-10-CM | POA: Diagnosis not present

## 2018-09-07 DIAGNOSIS — D0591 Unspecified type of carcinoma in situ of right breast: Secondary | ICD-10-CM | POA: Diagnosis not present

## 2018-09-07 DIAGNOSIS — Z510 Encounter for antineoplastic radiation therapy: Secondary | ICD-10-CM | POA: Diagnosis not present

## 2018-09-08 ENCOUNTER — Telehealth: Payer: Self-pay | Admitting: Hematology and Oncology

## 2018-09-08 NOTE — Telephone Encounter (Signed)
Scheduled appt per 3/23 sch message - sent reminder letter in the mail with appt date and time

## 2018-09-12 ENCOUNTER — Other Ambulatory Visit: Payer: Self-pay | Admitting: Family Medicine

## 2018-09-13 ENCOUNTER — Ambulatory Visit: Payer: Medicaid Other | Admitting: Radiation Oncology

## 2018-09-14 ENCOUNTER — Ambulatory Visit: Payer: Medicaid Other

## 2018-09-15 ENCOUNTER — Ambulatory Visit: Payer: Medicaid Other

## 2018-09-16 ENCOUNTER — Ambulatory Visit: Payer: Medicaid Other

## 2018-09-17 ENCOUNTER — Ambulatory Visit: Payer: Medicaid Other

## 2018-09-20 ENCOUNTER — Ambulatory Visit: Payer: Medicaid Other

## 2018-09-21 ENCOUNTER — Ambulatory Visit: Payer: Medicaid Other

## 2018-09-22 ENCOUNTER — Ambulatory Visit: Payer: Medicaid Other

## 2018-09-23 ENCOUNTER — Ambulatory Visit: Payer: Medicaid Other

## 2018-09-24 ENCOUNTER — Ambulatory Visit: Payer: Medicaid Other

## 2018-09-27 ENCOUNTER — Ambulatory Visit: Payer: Medicaid Other

## 2018-09-28 ENCOUNTER — Ambulatory Visit: Payer: Medicaid Other

## 2018-09-29 ENCOUNTER — Ambulatory Visit: Payer: Medicaid Other

## 2018-09-30 ENCOUNTER — Ambulatory Visit: Payer: Medicaid Other

## 2018-10-01 ENCOUNTER — Ambulatory Visit: Payer: Medicaid Other

## 2018-10-04 ENCOUNTER — Ambulatory Visit: Payer: Medicaid Other

## 2018-10-05 ENCOUNTER — Ambulatory Visit: Payer: Medicaid Other

## 2018-10-06 ENCOUNTER — Ambulatory Visit: Payer: Medicaid Other

## 2018-10-07 ENCOUNTER — Ambulatory Visit: Payer: Medicaid Other

## 2018-10-08 ENCOUNTER — Ambulatory Visit: Payer: Medicaid Other

## 2018-10-11 ENCOUNTER — Ambulatory Visit: Payer: Medicaid Other | Admitting: Radiation Oncology

## 2018-10-11 ENCOUNTER — Encounter: Payer: Self-pay | Admitting: Radiation Oncology

## 2018-10-11 ENCOUNTER — Ambulatory Visit: Admission: RE | Admit: 2018-10-11 | Payer: Medicaid Other | Source: Ambulatory Visit | Admitting: Radiation Oncology

## 2018-10-11 NOTE — Progress Notes (Signed)
Today I called the patient. We discussed the pros/ cons of radiotherapy now vs later while carefully considering her particular oncologic condition and also factoring the risks of the COVID 19 pandemic.  We are in agreement that it makes sense to delay radiotherapy further to maximize her ability to stay at home and reduce the risk of catching the virus outside the home.  I let her know that I will share the above plan with medical oncology and she will continue antiestrogen therapy.  She is very pleased with this plan.  I will order rad onc scheduling to postpone her treatment until June.  -----------------------------------  Eppie Gibson, MD

## 2018-10-12 ENCOUNTER — Ambulatory Visit: Payer: Medicaid Other

## 2018-10-13 ENCOUNTER — Ambulatory Visit: Payer: Medicaid Other

## 2018-10-14 ENCOUNTER — Other Ambulatory Visit: Payer: Self-pay | Admitting: Family Medicine

## 2018-10-14 ENCOUNTER — Other Ambulatory Visit: Payer: Self-pay | Admitting: Internal Medicine

## 2018-10-14 ENCOUNTER — Other Ambulatory Visit: Payer: Self-pay | Admitting: Sports Medicine

## 2018-10-14 ENCOUNTER — Ambulatory Visit: Payer: Medicaid Other

## 2018-10-15 ENCOUNTER — Ambulatory Visit: Payer: Medicaid Other

## 2018-10-18 ENCOUNTER — Ambulatory Visit: Payer: Medicaid Other

## 2018-10-19 ENCOUNTER — Ambulatory Visit: Payer: Medicaid Other

## 2018-10-20 ENCOUNTER — Ambulatory Visit: Payer: Medicaid Other

## 2018-10-21 ENCOUNTER — Ambulatory Visit: Payer: Medicaid Other

## 2018-10-21 NOTE — Assessment & Plan Note (Signed)
07/05/2018: Right lumpectomy: No residual carcinoma identified, right medial margin excision: DCIS high-grade 1.5 cm margins negative, additional margins benign, ER 90%, PR 90%, Tis NX stage 0  Because radiation was delayed due to coronavirus pandemic, we started her on antiestrogen therapy with anastrozole 1 mg daily 09/06/2018  Anastrozole toxicities:  Return to clinic at the end of radiation therapy for survivorship care plan visit.

## 2018-10-22 ENCOUNTER — Telehealth: Payer: Self-pay | Admitting: Hematology and Oncology

## 2018-10-22 ENCOUNTER — Ambulatory Visit: Payer: Medicaid Other

## 2018-10-22 NOTE — Telephone Encounter (Signed)
Called regarding upcoming Webex appointment, patient would prefer this to be a doximity appointment.

## 2018-10-22 NOTE — Telephone Encounter (Signed)
Called pt per 5/08 sch message - unable to reach pt to see if she still wanted to cancel appt even though it is a phone call

## 2018-10-23 NOTE — Progress Notes (Signed)
HEMATOLOGY-ONCOLOGY DOXIMITY VISIT PROGRESS NOTE  I connected with Deborah [name] on 10/25/2018 at  3:15 PM EDT by Doximity video conference and verified that I am speaking with the correct person using two identifiers.  I discussed the limitations, risks, security and privacy concerns of performing an evaluation and management service by Doximity and the availability of in person appointments.  I also discussed with the patient that there may be a patient responsible charge related to this service. The patient expressed understanding and agreed to proceed.  Patient's Location: Home Physician Location: Clinic  CHIEF COMPLIANT: Follow-up of right breast DCIS on anastrozole prior to radiation  INTERVAL HISTORY: Deborah [name] is a 63 y.o. female with above-mentioned history of right breast DCIS who underwent lumpectomy with re-excision and an aspiration on 08/26/18 of fluid collecting in the right breast. Radiation therapy was postponed due to the coronavirus pandemic and she was started on anastrozole on 09/06/18. She presents to the clinic today for follow-up.    Ductal carcinoma in situ (DCIS) of right breast   05/26/2018 Initial Diagnosis    History of laryngeal cancer treated with chemoradiation, screening detected right breast calcifications medial right breast measuring 1.9 cm, biopsy revealed high-grade DCIS with comedonecrosis and calcifications ER 90%, PR 90%, Tis NX stage 0    06/02/2018 Cancer Staging    Staging form: Breast, AJCC 8th Edition - Clinical: Stage 0 (cTis (DCIS), cN0, cM0, ER+, PR+, HER2: Not Assessed) - Signed by Nicholas Lose, MD on 06/02/2018    07/06/2018 Surgery    Right lumpectomy: No residual carcinoma identified, right medial margin excision: DCIS high-grade 1.5 cm margins negative, additional margins benign, ER 90%, PR 90%, Tis NX stage 0    09/06/2018 -  Anti-estrogen oral therapy    Anastrozole 60m daily     REVIEW OF SYSTEMS:    Constitutional: Denies fevers, chills or abnormal weight loss Eyes: Denies blurriness of vision Ears, nose, mouth, throat, and face: Denies mucositis or sore throat Respiratory: Denies cough, dyspnea or wheezes Cardiovascular: Denies palpitation, chest discomfort Gastrointestinal:  Denies nausea, heartburn or change in bowel habits Skin: Denies abnormal skin rashes Lymphatics: Denies new lymphadenopathy or easy bruising Neurological:Denies numbness, tingling or new weaknesses Behavioral/Psych: Mood is stable, no new changes  Extremities: No lower extremity edema Breast: denies any pain or lumps or nodules in either breasts All other systems were reviewed with the patient and are negative.  Observations/Objective:  There were no vitals filed for this visit. There is no height or weight on file to calculate BMI.  I have reviewed the data as listed CMP Latest Ref Rng & Units 07/29/2018 06/30/2018 06/02/2018  Glucose 70 - 99 mg/dL 101(H) 122(H) 90  BUN 6 - 20 mg/dL 16 16 17   Creatinine 0.44 - 1.00 mg/dL 0.84 0.91 1.07(H)  Sodium 135 - 145 mmol/L 140 140 142  Potassium 3.5 - 5.1 mmol/L 3.5 3.6 3.6  Chloride 98 - 111 mmol/L 110 106 108  CO2 22 - 32 mmol/L 22 22 25   Calcium 8.9 - 10.3 mg/dL 8.9 9.4 9.0  Total Protein 6.5 - 8.1 g/dL - - 7.9  Total Bilirubin 0.3 - 1.2 mg/dL - - 0.2(L)  Alkaline Phos 38 - 126 U/L - - 86  AST 15 - 41 U/L - - 16  ALT 0 - 44 U/L - - 17    Lab Results  Component Value Date   WBC 5.0 07/29/2018   HGB 11.1 (L) 07/29/2018   HCT 36.3 07/29/2018  MCV 93.3 07/29/2018   PLT 292 07/29/2018   NEUTROABS 2.6 06/02/2018      Assessment Plan:  Ductal carcinoma in situ (DCIS) of right breast 07/05/2018: Right lumpectomy: No residual carcinoma identified, right medial margin excision: DCIS high-grade 1.5 cm margins negative, additional margins benign, ER 90%, PR 90%, Tis NX stage 0  Because radiation was delayed due to coronavirus pandemic, we started her on  antiestrogen therapy with anastrozole 1 mg daily 09/06/2018  Anastrozole toxicities: She is tolerating anastrozole extremely well without any side effects. Denies any hot flashes or myalgias.  She may be starting radiation in June. Return to clinic at the end of radiation therapy for survivorship care plan visit.    I discussed the assessment and treatment plan with the patient. The patient was provided an opportunity to ask questions and all were answered. The patient agreed with the plan and demonstrated an understanding of the instructions. The patient was advised to call back or seek an in-person evaluation if the symptoms worsen or if the condition fails to improve as anticipated.   I provided 12 minutes of face-to-face Doximity time during this encounter.    Rulon Eisenmenger, MD 10/25/2018   I, Molly Dorshimer, am acting as scribe for Nicholas Lose, MD.  I have reviewed the above documentation for accuracy and completeness, and I agree with the above.

## 2018-10-25 ENCOUNTER — Inpatient Hospital Stay: Payer: Medicaid Other | Attending: Hematology and Oncology | Admitting: Hematology and Oncology

## 2018-10-25 ENCOUNTER — Ambulatory Visit: Payer: Medicaid Other

## 2018-10-25 DIAGNOSIS — D0511 Intraductal carcinoma in situ of right breast: Secondary | ICD-10-CM

## 2018-10-26 ENCOUNTER — Ambulatory Visit: Payer: Medicaid Other

## 2018-10-27 ENCOUNTER — Ambulatory Visit: Payer: Medicaid Other

## 2018-10-28 ENCOUNTER — Ambulatory Visit: Payer: Medicaid Other

## 2018-10-29 ENCOUNTER — Encounter: Payer: Self-pay | Admitting: *Deleted

## 2018-10-29 ENCOUNTER — Other Ambulatory Visit: Payer: Self-pay

## 2018-10-29 ENCOUNTER — Ambulatory Visit: Payer: Medicaid Other

## 2018-11-01 ENCOUNTER — Ambulatory Visit: Payer: Medicaid Other

## 2018-11-03 ENCOUNTER — Telehealth: Payer: Self-pay | Admitting: Hematology and Oncology

## 2018-11-03 NOTE — Telephone Encounter (Signed)
Left message re July SCP visit. Schedule mailed.

## 2018-11-15 ENCOUNTER — Ambulatory Visit
Admission: RE | Admit: 2018-11-15 | Discharge: 2018-11-15 | Disposition: A | Discharge status: Home / Self Care | Payer: Medicaid Other | Source: Ambulatory Visit | Attending: Radiation Oncology | Admitting: Radiation Oncology

## 2018-11-15 ENCOUNTER — Encounter: Payer: Self-pay | Admitting: Radiation Oncology

## 2018-11-15 ENCOUNTER — Other Ambulatory Visit: Payer: Self-pay

## 2018-11-15 DIAGNOSIS — I10 Essential (primary) hypertension: Secondary | ICD-10-CM | POA: Diagnosis not present

## 2018-11-15 DIAGNOSIS — Z923 Personal history of irradiation: Secondary | ICD-10-CM | POA: Insufficient documentation

## 2018-11-15 DIAGNOSIS — D0511 Intraductal carcinoma in situ of right breast: Secondary | ICD-10-CM | POA: Insufficient documentation

## 2018-11-15 DIAGNOSIS — Z791 Long term (current) use of non-steroidal anti-inflammatories (NSAID): Secondary | ICD-10-CM | POA: Diagnosis not present

## 2018-11-15 DIAGNOSIS — Z79811 Long term (current) use of aromatase inhibitors: Secondary | ICD-10-CM | POA: Diagnosis not present

## 2018-11-15 DIAGNOSIS — D0591 Unspecified type of carcinoma in situ of right breast: Secondary | ICD-10-CM | POA: Diagnosis not present

## 2018-11-15 DIAGNOSIS — Z9002 Acquired absence of larynx: Secondary | ICD-10-CM | POA: Diagnosis not present

## 2018-11-15 DIAGNOSIS — Z7951 Long term (current) use of inhaled steroids: Secondary | ICD-10-CM | POA: Diagnosis not present

## 2018-11-15 DIAGNOSIS — Z510 Encounter for antineoplastic radiation therapy: Secondary | ICD-10-CM | POA: Insufficient documentation

## 2018-11-15 DIAGNOSIS — Z448 Encounter for fitting and adjustment of other external prosthetic devices: Secondary | ICD-10-CM | POA: Diagnosis not present

## 2018-11-15 DIAGNOSIS — Z170 Estrogen receptor positive status [ER+]: Secondary | ICD-10-CM | POA: Insufficient documentation

## 2018-11-15 DIAGNOSIS — Z79899 Other long term (current) drug therapy: Secondary | ICD-10-CM | POA: Insufficient documentation

## 2018-11-15 DIAGNOSIS — Z963 Presence of artificial larynx: Secondary | ICD-10-CM | POA: Diagnosis not present

## 2018-11-15 MED ORDER — RADIAPLEXRX EX GEL
Freq: Once | CUTANEOUS | Status: AC
  Administered 2018-11-15: 14:00:00 via TOPICAL

## 2018-11-15 MED ORDER — ALRA NON-METALLIC DEODORANT (RAD-ONC)
1.0000 "application " | Freq: Once | TOPICAL | Status: AC
  Administered 2018-11-15: 1 via TOPICAL

## 2018-11-15 NOTE — Progress Notes (Signed)

## 2018-11-15 NOTE — Patient Instructions (Signed)
Coronavirus (COVID-19) Are you at risk?  Are you at risk for the Coronavirus (COVID-19)?  To be considered HIGH RISK for Coronavirus (COVID-19), you have to meet the following criteria:  . Traveled to [place], [place], [place], [place] or [place]; or in the [place] to [place], [place], [place], or [place]; and have fever, cough, and shortness of breath within the last 2 weeks of travel OR . Been in close contact with a person diagnosed with COVID-19 within the last 2 weeks and have fever, cough, and shortness of breath . IF YOU DO NOT MEET THESE CRITERIA, YOU ARE CONSIDERED LOW RISK FOR COVID-19.  What to do if you are HIGH RISK for COVID-19?  . If you are having a medical emergency, call 911. . Seek medical care right away. Before you go to a doctor's office, urgent care or emergency department, call ahead and tell them about your recent travel, contact with someone diagnosed with COVID-19, and your symptoms. You should receive instructions from your physician's office regarding next steps of care.  . When you arrive at healthcare provider, tell the healthcare staff immediately you have returned from visiting [place], [place], [place], [place] or [place]; or traveled in the [place] to [place], [place], [place], or [place]; in the last two weeks or you have been in close contact with a person diagnosed with COVID-19 in the last 2 weeks.   . Tell the health care staff about your symptoms: fever, cough and shortness of breath. . After you have been seen by a medical provider, you will be either: o Tested for (COVID-19) and discharged home on quarantine except to seek medical care if symptoms worsen, and asked to  - Stay home and avoid contact with others until you get your results (4-5 days)  - Avoid travel on public transportation if possible (such as bus, train, or airplane) or o Sent to the Emergency Department by EMS for evaluation, COVID-19 testing, and possible  admission depending on your condition and test results.  What to do if you are LOW RISK for COVID-19?  Reduce your risk of any infection by using the same precautions used for avoiding the common cold or flu:  . Wash your hands often with soap and warm water for at least 20 seconds.  If soap and water are not readily available, use an alcohol-based hand sanitizer with at least 60% alcohol.  . If coughing or sneezing, cover your mouth and nose by coughing or sneezing into the elbow areas of your shirt or coat, into a tissue or into your sleeve (not your hands). . Avoid shaking hands with others and consider head nods or verbal greetings only. . Avoid touching your eyes, nose, or mouth with unwashed hands.  . Avoid close contact with people who are sick. . Avoid places or events with large numbers of people in one location, like concerts or sporting events. . Carefully consider travel plans you have or are making. . If you are planning any travel outside or inside the [place], visit the CDC's Travelers' Health webpage for the latest health notices. . If you have some symptoms but not all symptoms, continue to monitor at home and seek medical attention if your symptoms worsen. . If you are having a medical emergency, call 911.   ADDITIONAL HEALTHCARE OPTIONS FOR PATIENTS  [facility] / e-Visit: [email or web address]         [facility]: [phone]  [facility]: [phone]                   [facility]: [phone]   

## 2018-11-16 ENCOUNTER — Other Ambulatory Visit: Payer: Self-pay

## 2018-11-16 ENCOUNTER — Ambulatory Visit
Admission: RE | Admit: 2018-11-16 | Discharge: 2018-11-16 | Disposition: A | Discharge status: Home / Self Care | Payer: Medicaid Other | Source: Ambulatory Visit | Attending: Radiation Oncology | Admitting: Radiation Oncology

## 2018-11-16 ENCOUNTER — Emergency Department (HOSPITAL_COMMUNITY): Payer: Medicaid Other

## 2018-11-16 ENCOUNTER — Encounter: Payer: Self-pay | Admitting: Radiation Oncology

## 2018-11-16 ENCOUNTER — Emergency Department (HOSPITAL_COMMUNITY)
Admission: EM | Admit: 2018-11-16 | Discharge: 2018-11-17 | Disposition: A | Discharge status: Home / Self Care | Payer: Medicaid Other | Attending: Emergency Medicine | Admitting: Emergency Medicine

## 2018-11-16 DIAGNOSIS — R0602 Shortness of breath: Secondary | ICD-10-CM | POA: Insufficient documentation

## 2018-11-16 DIAGNOSIS — Z03818 Encounter for observation for suspected exposure to other biological agents ruled out: Secondary | ICD-10-CM | POA: Diagnosis not present

## 2018-11-16 DIAGNOSIS — Z430 Encounter for attention to tracheostomy: Secondary | ICD-10-CM | POA: Diagnosis present

## 2018-11-16 DIAGNOSIS — Z930 Tracheostomy status: Secondary | ICD-10-CM | POA: Insufficient documentation

## 2018-11-16 DIAGNOSIS — Y92003 Bedroom of unspecified non-institutional (private) residence as the place of occurrence of the external cause: Secondary | ICD-10-CM | POA: Insufficient documentation

## 2018-11-16 DIAGNOSIS — I10 Essential (primary) hypertension: Secondary | ICD-10-CM | POA: Diagnosis not present

## 2018-11-16 DIAGNOSIS — Z853 Personal history of malignant neoplasm of breast: Secondary | ICD-10-CM | POA: Diagnosis not present

## 2018-11-16 DIAGNOSIS — E039 Hypothyroidism, unspecified: Secondary | ICD-10-CM | POA: Diagnosis not present

## 2018-11-16 DIAGNOSIS — Y939 Activity, unspecified: Secondary | ICD-10-CM | POA: Insufficient documentation

## 2018-11-16 DIAGNOSIS — R0902 Hypoxemia: Secondary | ICD-10-CM | POA: Diagnosis not present

## 2018-11-16 DIAGNOSIS — J449 Chronic obstructive pulmonary disease, unspecified: Secondary | ICD-10-CM | POA: Diagnosis not present

## 2018-11-16 DIAGNOSIS — R1310 Dysphagia, unspecified: Secondary | ICD-10-CM | POA: Diagnosis not present

## 2018-11-16 DIAGNOSIS — J45909 Unspecified asthma, uncomplicated: Secondary | ICD-10-CM | POA: Insufficient documentation

## 2018-11-16 DIAGNOSIS — D0511 Intraductal carcinoma in situ of right breast: Secondary | ICD-10-CM | POA: Diagnosis not present

## 2018-11-16 DIAGNOSIS — J9811 Atelectasis: Secondary | ICD-10-CM | POA: Diagnosis not present

## 2018-11-16 DIAGNOSIS — Y999 Unspecified external cause status: Secondary | ICD-10-CM | POA: Diagnosis not present

## 2018-11-16 DIAGNOSIS — Z8521 Personal history of malignant neoplasm of larynx: Secondary | ICD-10-CM | POA: Diagnosis not present

## 2018-11-16 DIAGNOSIS — Z510 Encounter for antineoplastic radiation therapy: Secondary | ICD-10-CM | POA: Diagnosis not present

## 2018-11-16 DIAGNOSIS — R918 Other nonspecific abnormal finding of lung field: Secondary | ICD-10-CM | POA: Diagnosis not present

## 2018-11-16 DIAGNOSIS — Z87891 Personal history of nicotine dependence: Secondary | ICD-10-CM | POA: Diagnosis not present

## 2018-11-16 DIAGNOSIS — T189XXA Foreign body of alimentary tract, part unspecified, initial encounter: Secondary | ICD-10-CM | POA: Diagnosis not present

## 2018-11-16 DIAGNOSIS — Z79899 Other long term (current) drug therapy: Secondary | ICD-10-CM | POA: Diagnosis not present

## 2018-11-16 DIAGNOSIS — D0591 Unspecified type of carcinoma in situ of right breast: Secondary | ICD-10-CM | POA: Diagnosis not present

## 2018-11-16 DIAGNOSIS — X58XXXA Exposure to other specified factors, initial encounter: Secondary | ICD-10-CM | POA: Insufficient documentation

## 2018-11-16 DIAGNOSIS — R000 Tachycardia, unspecified: Secondary | ICD-10-CM | POA: Diagnosis not present

## 2018-11-16 NOTE — ED Notes (Signed)
Bed: WA25 Expected date:  Expected time:  Means of arrival:  Comments: EMS  

## 2018-11-16 NOTE — ED Triage Notes (Signed)
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 Pt is currently oxygenating with 15L blow by via NRB and is at 95%.

## 2018-11-16 NOTE — Progress Notes (Signed)
Radiation Oncology         [id number]) [phone] ________________________________  Name: Deborah [name] MRN: [id number]  Date: 11/15/2018  DOB: Dec 06, 1955  Follow-Up Visit Note  Outpatient  CC: Bufford Lope, DO  Nicholas Lose, MD  Diagnosis and Prior Radiotherapy:    ICD-10-CM   1. Ductal carcinoma in situ (DCIS) of right breast D05.11     CHIEF COMPLAINT: DCIS, right breast  Narrative:  The patient returns today for routine follow-up.  Her treatment was delayed due to the pandemic.  The patient has no new complaints.  She is ready for treatment today.  She does not have any new questions.  She has been taking anastrozole                            ALLERGIES:  is allergic to lisinopril.  Meds: Current Outpatient Medications  Medication Sig Dispense Refill  . amLODipine (NORVASC) 10 MG tablet TAKE 1 TABLET BY MOUTH ONCE DAILY (Patient taking differently: Take 10 mg by mouth daily. ) 90 tablet 3  . anastrozole (ARIMIDEX) 1 MG tablet Take 1 tablet (1 mg total) by mouth daily. 90 tablet 3  . buPROPion (WELLBUTRIN XL) 150 MG 24 hr tablet Take 1 tablet (150 mg total) by mouth daily. 90 tablet 3  . chlorpheniramine (CHLOR-TRIMETON) 4 MG tablet Take 4 mg by mouth daily.    . furosemide (LASIX) 20 MG tablet TAKE 1 TABLET BY MOUTH DAILY AS NEEDED FOR  SWELLING  OR  SHORTNESS  OF  BREATH (Patient taking differently: Take 20 mg by mouth daily as needed for edema. ) 30 tablet 0  . levothyroxine (SYNTHROID, LEVOTHROID) 175 MCG tablet Take 1 tablet (175 mcg total) by mouth daily before breakfast. 30 tablet 3  . losartan (COZAAR) 100 MG tablet Take 1 tablet (100 mg total) by mouth daily. 30 tablet 3  . meloxicam (MOBIC) 15 MG tablet TAKE 1 TABLET BY MOUTH DAILY AS NEEDED 30 tablet 0  . metoprolol tartrate (LOPRESSOR) 50 MG tablet Take 1 tablet by mouth twice daily 60 tablet 0  . mometasone-formoterol (DULERA) 200-5 MCG/ACT AERO Inhale 2 puffs into the lungs 2 (two) times daily. 13 g 3  . nystatin  cream (MYCOSTATIN) Apply 1 application topically 2 (two) times daily as needed (rash). 30 g 0  . omeprazole (PRILOSEC) 40 MG capsule Take 40 mg by mouth 2 (two) times daily.     [name] PROVENTIL HFA 108 (90 Base) MCG/ACT inhaler INHALE 2 PUFFS BY MOUTH EVERY 4 HOURS AS NEEDED OR SHORTNESS OF BREATH 14 g 2  . traMADol (ULTRAM) 50 MG tablet Take 1 tablet (50 mg total) by mouth every 6 (six) hours as needed. 10 tablet 0  . doxycycline (VIBRA-TABS) 100 MG tablet Take 100 mg by mouth 2 (two) times daily.    . TRANSDERM-SCOP, 1.5 MG, 1 MG/3DAYS PLACE 1 PATCH ONTO THE SKIN EVERY 3 DAYS AS NEEDED (Patient not taking: PLACE 1 PATCH ONTO THE SKIN EVERY 3 DAYS AS NEEDED) 10 patch 2   No current facility-administered medications for this encounter.     Physical Findings: The patient is in no acute distress. Patient is alert and oriented.  weight is 235 lb 12.8 oz (107 kg). Her oral temperature is 98.3 F (36.8 C). Her blood pressure is 151/96 (abnormal) and her pulse is 75. Her oxygen saturation is 97%. .  She is coughing from her trach.  She has a facemask  secured around her trach.  Level of congestion that is audible is consistent with her baseline.   Lab Findings: Lab Results  Component Value Date   WBC 5.0 07/29/2018   HGB 11.1 (L) 07/29/2018   HCT 36.3 07/29/2018   MCV 93.3 07/29/2018   PLT 292 07/29/2018    Radiographic Findings: No results found.  Impression/Plan: Patient is ready to proceed with radiotherapy today.  She will  receive her first fraction today.  She is pleased with this plan.  _____________________________________   Eppie Gibson, MD

## 2018-11-17 ENCOUNTER — Other Ambulatory Visit: Payer: Self-pay

## 2018-11-17 ENCOUNTER — Emergency Department (HOSPITAL_COMMUNITY): Payer: Medicaid Other

## 2018-11-17 ENCOUNTER — Ambulatory Visit
Admission: RE | Admit: 2018-11-17 | Discharge: 2018-11-17 | Disposition: A | Discharge status: Home / Self Care | Payer: Medicaid Other | Source: Ambulatory Visit | Attending: Radiation Oncology | Admitting: Radiation Oncology

## 2018-11-17 DIAGNOSIS — T189XXA Foreign body of alimentary tract, part unspecified, initial encounter: Secondary | ICD-10-CM | POA: Diagnosis not present

## 2018-11-17 DIAGNOSIS — J9811 Atelectasis: Secondary | ICD-10-CM | POA: Diagnosis not present

## 2018-11-17 DIAGNOSIS — D0511 Intraductal carcinoma in situ of right breast: Secondary | ICD-10-CM | POA: Diagnosis not present

## 2018-11-17 DIAGNOSIS — R1310 Dysphagia, unspecified: Secondary | ICD-10-CM | POA: Diagnosis not present

## 2018-11-17 DIAGNOSIS — Z510 Encounter for antineoplastic radiation therapy: Secondary | ICD-10-CM | POA: Diagnosis not present

## 2018-11-17 DIAGNOSIS — Z79899 Other long term (current) drug therapy: Secondary | ICD-10-CM | POA: Diagnosis not present

## 2018-11-17 DIAGNOSIS — Z8521 Personal history of malignant neoplasm of larynx: Secondary | ICD-10-CM | POA: Diagnosis not present

## 2018-11-17 DIAGNOSIS — Z963 Presence of artificial larynx: Secondary | ICD-10-CM | POA: Diagnosis not present

## 2018-11-17 DIAGNOSIS — D0591 Unspecified type of carcinoma in situ of right breast: Secondary | ICD-10-CM | POA: Diagnosis not present

## 2018-11-17 DIAGNOSIS — Z448 Encounter for fitting and adjustment of other external prosthetic devices: Secondary | ICD-10-CM | POA: Diagnosis not present

## 2018-11-17 DIAGNOSIS — R918 Other nonspecific abnormal finding of lung field: Secondary | ICD-10-CM | POA: Diagnosis not present

## 2018-11-17 LAB — SARS CORONAVIRUS 2 BY RT PCR (HOSPITAL ORDER, PERFORMED IN ~~LOC~~ HOSPITAL LAB): SARS Coronavirus 2: NEGATIVE

## 2018-11-17 NOTE — ED Notes (Signed)
Pt resting in bed. Call bell is within reach. Pt asking about a provider coming into the room, updated on plan of care. NAD at this moment.

## 2018-11-17 NOTE — ED Notes (Signed)
Provider, Dr. Florina Ou, notified of pt becoming upset and tachycardic periodically.

## 2018-11-17 NOTE — Discharge Instructions (Signed)
Leave red rubber catheter (#8 [place]) in your neck until you see your therapist at [facility].  Call [facility] first thing this morning to be seen by the Speech Therapy Department, if possible, to have your TEP device replaced so that the hole does not close. [name]

## 2018-11-17 NOTE — ED Notes (Signed)
Husband is on his way

## 2018-11-17 NOTE — ED Notes (Signed)
Attempted to call husband. Left a voicemail to call back.

## 2018-11-17 NOTE — ED Notes (Signed)
ENT at bedside with patient. Nasal swab done. Pt in NAD

## 2018-11-17 NOTE — Consult Note (Signed)
[name], [name]  History of Presenting Illness:  History obtained from the patient Deborah [name] is a  63 y.o. female presenting with dysphagia. 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 At approximately 11pm, she felt the TEP dislodge. Feels like it is still stuck in her esophagus.  She is tolerating her secretions.  She is confident she did not aspirate it.  She has not been coughing and is not short of breath.  She is not in any significant pain.  She is had multiple issues with her TEP in the past.  She is typically treated at [facility] for her TEP prosthesis changes.  Past Medical History:  Diagnosis Date  . Anemia   . Arthritis    in knees  . Asthma   . Breast cancer ([place])   . Bronchitis   . COPD (chronic obstructive pulmonary disease) ([place])   . Diverticulosis 11/15/2012    noted on screening colonoscopy   . Dyspnea    with COPD exerbation  . Esophageal stricture   . Former smoker 03/19/2011  . GERD (gastroesophageal reflux disease)   . Heart murmur    asymptomatic   . History of laryngectomy   . 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 Hypertension   . 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 Internal hemorrhoid 11/15/2012    small, noted on screening colonoscopy   . 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  . Leukocytopenia   . Nausea alone 07/28/2013  . Neck pain 01/21/2012  . Normal MRI 07/14/11   negative for mestasis   . Pneumonia 2012  . Sciatica   . Seizures ([place])    07/24/11 off Effexor w/o seizure  . Sepsis ([place]) 08/04/12  . Sinusitis, chronic 07/20/2011   Bilateral maxillary, identified on MRI of head 07/14/11.    . Tracheostomy dependent [facility])     Past Surgical History:   Procedure Laterality Date  . BREAST LUMPECTOMY Right 07/06/2018  . BREAST LUMPECTOMY WITH RADIOACTIVE SEED LOCALIZATION Right 07/06/2018   Procedure: RIGHT BREAST LUMPECTOMY WITH RADIOACTIVE SEED LOCALIZATION;  Surgeon: Rolm Bookbinder, MD;  Location: [place];  Service: General;  Laterality: Right;  . COLONOSCOPY N/A 11/15/2012   Procedure: COLONOSCOPY;  Surgeon: Lafayette Dragon, MD;  Location: [facility];  Service: Endoscopy;  Laterality: N/A;  . DENTAL RESTORATION/EXTRACTION WITH X-RAY    . ESOPHAGOGASTRODUODENOSCOPY (EGD) WITH PROPOFOL N/A 08/12/2018   Procedure: ESOPHAGOGASTRODUODENOSCOPY (EGD) WITH PROPOFOL;  Surgeon: Doran Stabler, MD;  Location: [place];  Service: Gastroenterology;  Laterality: N/A;  . ESOPHAGOSCOPY  06/21/2012   Procedure: ESOPHAGOSCOPY;  Surgeon: [name], MD;  Location: [place];  Service: ENT;  Laterality: N/A;  . ESOPHAGOSCOPY WITH DILITATION N/A 09/21/2014   Procedure: ESOPHAGOSCOPY WITH DILITATION;  Surgeon: [name], MD;  Location: [place];  Service: ENT;  Laterality: N/A;  . ESOPHAGOSCOPY WITH DILITATION N/A 07/04/2016   Procedure: ESOPHAGOSCOPY WITH DILITATION;  Surgeon: [name], MD;  Location: [place];  Service: ENT;  Laterality: N/A;  . ESOPHAGOSCOPY WITH DILITATION N/A 12/01/2017   Procedure: ESOPHAGOSCOPY WITH DILITATION;  Surgeon: [name], MD;  Location: [place];  Service: ENT;  Laterality: N/A;  . ESOPHAGOSCOPY WITH DILITATION N/A 04/19/2018   Procedure: ESOPHAGOSCOPY  WITH DILITATION;  Surgeon: [name], MD;  Location: [place];  Service: ENT;  Laterality: N/A;  . FLEXIBLE BRONCHOSCOPY  01/08/2018      . FOREIGN BODY REMOVAL BRONCHIAL  10/02/2011   Procedure: REMOVAL FOREIGN BODY BRONCHIAL;  Surgeon: Ruby Cola, MD;  Location: [place];  Service: ENT;  Laterality: N/A;  . FOREIGN BODY REMOVAL BRONCHIAL N/A 01/08/2018   Procedure: REMOVAL FOREIGN BODY BRONCHIAL;  Surgeon: Jodi Marble, MD;  Location: [facility];  Service: ENT;   Laterality: N/A;  . LARYNGECTOMY    . Porta cath removal    . PORTACATH PLACEMENT  09/17/10   Tip in cavoatrial junction  . RE-EXCISION OF BREAST CANCER,SUPERIOR MARGINS Right 07/29/2018   Procedure: RE-EXCISION OF RIGHT BREAST MEDIAL MARGINS;  Surgeon: Rolm Bookbinder, MD;  Location: [place];  Service: General;  Laterality: Right;  . STOMAPLASTY N/A 10/21/2016   Procedure: Zola Button;  Surgeon: [name], MD;  Location: [place];  Service: ENT;  Laterality: N/A;  . TRACHEAL DILITATION  07/16/2011   Procedure: TRACHEAL DILITATION;  Surgeon: Beckie Salts, MD;  Location: [place];  Service: ENT;  Laterality: N/A;  dilation of tracheal stoma and replacement of stoma tube  . TUBAL LIGATION  1982    Family History  Problem Relation Age of Onset  . Heart disease Mother   . Heart disease Father   . Heart disease Sister   . Cancer Brother        type unknown  . Liver disease Maternal Grandmother   . Cancer Sister        type unknown  . Diabetes Sister   . Breast cancer Neg Hx     Social History   Socioeconomic History  . Marital status: Divorced    Spouse name: Not on file  . Number of children: 2  . Years of education: 86  . Highest education level: Not on file  Occupational History  . Occupation: part time    Comment: warehouse work  Scientific laboratory technician  . Financial resource strain: Not on file  . Food insecurity:    Worry: Not on file    Inability: Not on file  . Transportation needs:    Medical: No    Non-medical: No  Tobacco Use  . Smoking status: Former Smoker    Packs/day: 2.00    Years: 20.00    Pack years: 40.00    Types: Cigarettes    Last attempt to quit: 09/17/2010    Years since quitting: 8.1  . Smokeless tobacco: Never Used  Substance and Sexual Activity  . Alcohol use: Yes    Comment: Socially drinks   . Drug use: No    Comment: tried cocaine 1 time 2010 only used 1 time  . Sexual activity: Yes    Birth control/protection: Surgical  Lifestyle  . Physical  activity:    Days per week: Not on file    Minutes per session: Not on file  . Stress: Not on file  Relationships  . Social connections:    Talks on phone: Not on file    Gets together: Not on file    Attends religious service: Not on file    Active member of club or organization: Not on file    Attends meetings of clubs or organizations: Not on file    Relationship status: Not on file  Other Topics Concern  . Not on file  Social History Narrative   Lives with boyfriend    No current  facility-administered medications on file prior to encounter.    Current Outpatient Medications on File Prior to Encounter  Medication Sig Dispense Refill  . amLODipine (NORVASC) 10 MG tablet TAKE 1 TABLET BY MOUTH ONCE DAILY (Patient taking differently: Take 10 mg by mouth daily. ) 90 tablet 3  . anastrozole (ARIMIDEX) 1 MG tablet Take 1 tablet (1 mg total) by mouth daily. 90 tablet 3  . buPROPion (WELLBUTRIN XL) 150 MG 24 hr tablet Take 1 tablet (150 mg total) by mouth daily. 90 tablet 3  . chlorpheniramine (CHLOR-TRIMETON) 4 MG tablet Take 4 mg by mouth daily.    [name] doxycycline (VIBRA-TABS) 100 MG tablet Take 100 mg by mouth 2 (two) times daily.    . furosemide (LASIX) 20 MG tablet TAKE 1 TABLET BY MOUTH DAILY AS NEEDED FOR  SWELLING  OR  SHORTNESS  OF  BREATH (Patient taking differently: Take 20 mg by mouth daily as needed for edema. ) 30 tablet 0  . levothyroxine (SYNTHROID, LEVOTHROID) 175 MCG tablet Take 1 tablet (175 mcg total) by mouth daily before breakfast. 30 tablet 3  . losartan (COZAAR) 100 MG tablet Take 1 tablet (100 mg total) by mouth daily. 30 tablet 3  . meloxicam (MOBIC) 15 MG tablet TAKE 1 TABLET BY MOUTH DAILY AS NEEDED 30 tablet 0  . metoprolol tartrate (LOPRESSOR) 50 MG tablet Take 1 tablet by mouth twice daily 60 tablet 0  . mometasone-formoterol (DULERA) 200-5 MCG/ACT AERO Inhale 2 puffs into the lungs 2 (two) times daily. 13 g 3  . nystatin cream (MYCOSTATIN) Apply 1 application  topically 2 (two) times daily as needed (rash). 30 g 0  . omeprazole (PRILOSEC) 40 MG capsule Take 40 mg by mouth 2 (two) times daily.     [name] PROVENTIL HFA 108 (90 Base) MCG/ACT inhaler INHALE 2 PUFFS BY MOUTH EVERY 4 HOURS AS NEEDED OR SHORTNESS OF BREATH 14 g 2  . traMADol (ULTRAM) 50 MG tablet Take 1 tablet (50 mg total) by mouth every 6 (six) hours as needed. 10 tablet 0  . TRANSDERM-SCOP, 1.5 MG, 1 MG/3DAYS PLACE 1 PATCH ONTO THE SKIN EVERY 3 DAYS AS NEEDED (Patient not taking: PLACE 1 PATCH ONTO THE SKIN EVERY 3 DAYS AS NEEDED) 10 patch 2    Allergies  Allergen Reactions  . Lisinopril Cough     Review of Systems: Complete, negative except for the above   OBJECTIVE: Vital Signs: Vitals:   11/17/18 0200 11/17/18 0300  BP:    Pulse: 86 93  Resp:    Temp:    SpO2: 100% 100%    I&O No intake or output data in the 24 hours ending 11/17/18 0401  Physical Exam General: Well developed, well nourished. No acute distress.  TEP site narrow and without the prosthesis.  Remains patent.  Head/Face: Normocephalic, atraumatic. No scars or lesions. No sinus tenderness. Facial nerve intact and equal bilaterally.  No facial lacerations. Salivary glands non tender and without palpable masses  Eyes: Globes well positioned, no proptosis Lids: No periorbital edema/ecchymosis. No lid laceration Conjunctiva: No chemosis, hemorrhage EOMI, PERRLA  Ears: No gross deformity. Normal external canal. Tympanic membrane intact bilaterally, no middle ear effusion  Nose: No gross deformity or lesions. No purulent discharge. Septum midline. No turbinate hypertrophy.  Mouth/Oropharynx: Lips without any lesions.  Edentulous wearing dentures.  No mucosal lesions within the oropharynx. No tonsillar enlargement, exudate, or lesions. Pharyngeal walls symmetrical. Uvula midline. Tongue midline without lesions.  Larynx: See TFL  Nasopharynx:  See TFL  Neck:  Woody/post radiated neck trachea midline. No masses. No  thyromegaly or nodules palpated. No crepitus.  Lymphatic: No lymphadenopathy in the neck.  Respiratory: No stridor or distress.  Cardiovascular: Regular rate and rhythm.  Extremities: No edema or cyanosis. Warm and well-perfused.  Skin: No scars or lesions on face or neck.  Neurologic: CN II-XII intact. Moving all extremities without gross abnormality.  Other:      Labs: Lab Results  Component Value Date   WBC 5.0 07/29/2018   HGB 11.1 (L) 07/29/2018   HCT 36.3 07/29/2018   PLT 292 07/29/2018   CHOL 186 08/24/2017   TRIG 120 08/24/2017   HDL 50 08/24/2017   ALT 17 06/02/2018   AST 16 06/02/2018   NA 140 07/29/2018   K 3.5 07/29/2018   CL 110 07/29/2018   CREATININE 0.84 07/29/2018   BUN 16 07/29/2018   CO2 22 07/29/2018   TSH 1.280 04/08/2018   INR 0.97 08/04/2013   Procedure: Transnasal flexible laryngoscopy and flexible tracheoscopy. Findings: No foreign body identified Details: The patient's nares were anesthetized with 2% viscous lidocaine as well as several sprays of Afrin.  The flexible T FL scope, 4 mm Storz, was placed into the right naris and utilized to examine the floor of the nose, the nasopharynx and the proximal esophageal reconstruction.  There was obvious stenosis.  No further mass or lesions.  There was no identifiable foreign body.  The scope was then removed.  The scope was utilized to also examine down her trachea through the laryngectomy site.  The laryngeal stoma appeared normal.  TEP site was patent without any obvious foreign body.  The scope was advanced to the level of the mainstem bronchi and utilized on both sides of the main bronchi without any identifiable foreign body.  Review of Ancillary Data / Diagnostic Tests: Chest x-ray performed and was normal.  COVID swab done and is pending.  ASSESSMENT:  63 y.o. female with swallowed TEP prosthesis.  Not identifiable on flexible T FL examination today.  She is tolerating her secretions.  I confirmed  there was no aspiration of this device.  A red rubber catheter, #8 [place], was able to be placed in the tracheoesophageal puncture to stented open.  RECOMMENDATIONS: Red rubber placed in TEP site.  Patient is still see the speech therapist later today to have TEP replaced.    Gavin Pound, MD  [facility] [place] Office phone [phone]

## 2018-11-17 NOTE — ED Provider Notes (Signed)
[facility] DEPT Provider Note: Georgena Spurling, MD, FACEP  CSN: [id number] MRN: [id number] ARRIVAL: 11/16/18 at 2313 ROOM: [place]  Foreign body in Airway   HISTORY Deborah [name]  11/17/18 1:06 AM Deborah [name] is a 63 y.o. female who has a stoma status post laryngectomy.  She was preparing for bed yesterday evening and believes she aspirated her "prosthesis", a small part which aids in vocalization.  She states she feels like the part is sitting on top of her esophagus.  Although it is not causing frank pain it is causing her significant distress due to the foreign body sensation.  She feels short of breath.   Past Medical History:  Diagnosis Date  . Anemia   . Arthritis    in knees  . Asthma   . Breast cancer ([place])   . Bronchitis   . COPD (chronic obstructive pulmonary disease) ([place])   . Diverticulosis 11/15/2012    noted on screening colonoscopy   . Dyspnea    with COPD exerbation  . Esophageal stricture   . Former smoker 03/19/2011  . GERD (gastroesophageal reflux disease)   . Heart murmur    asymptomatic   . History of laryngectomy   . 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 Hypertension   . 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 Internal hemorrhoid 11/15/2012    small, noted on screening colonoscopy   . 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  . Leukocytopenia   . Nausea alone 07/28/2013  . Neck pain 01/21/2012  . Normal MRI 07/14/11   negative for mestasis   . Pneumonia 2012  . Sciatica   . Seizures ([place])    07/24/11 off Effexor w/o seizure  . Sepsis ([place]) 08/04/12  . Sinusitis, chronic 07/20/2011   Bilateral maxillary, identified on MRI of head 07/14/11.    . Tracheostomy dependent [facility])     Past Surgical History:  Procedure Laterality Date  . BREAST LUMPECTOMY Right 07/06/2018  . BREAST LUMPECTOMY WITH RADIOACTIVE SEED LOCALIZATION Right 07/06/2018   Procedure: RIGHT BREAST LUMPECTOMY WITH RADIOACTIVE SEED LOCALIZATION;  Surgeon: Rolm Bookbinder, MD;  Location: [facility];  Service: General;  Laterality: Right;  . COLONOSCOPY N/A 11/15/2012   Procedure: COLONOSCOPY;  Surgeon: Lafayette Dragon, MD;  Location: [facility];  Service: Endoscopy;  Laterality: N/A;  . DENTAL RESTORATION/EXTRACTION WITH X-RAY    . ESOPHAGOGASTRODUODENOSCOPY (EGD) WITH PROPOFOL N/A 08/12/2018   Procedure: ESOPHAGOGASTRODUODENOSCOPY (EGD) WITH PROPOFOL;  Surgeon: Doran Stabler, MD;  Location: [facility];  Service: Gastroenterology;  Laterality: N/A;  . ESOPHAGOSCOPY  06/21/2012   Procedure: ESOPHAGOSCOPY;  Surgeon: [name], MD;  Location: [facility];  Service: ENT;  Laterality: N/A;  . ESOPHAGOSCOPY WITH DILITATION N/A 09/21/2014   Procedure: ESOPHAGOSCOPY WITH DILITATION;  Surgeon: [name], MD;  Location: [facility];  Service: ENT;  Laterality: N/A;  . ESOPHAGOSCOPY WITH DILITATION N/A 07/04/2016   Procedure: ESOPHAGOSCOPY WITH DILITATION;  Surgeon: [name], MD;  Location: [facility] OR;  Service: ENT;  Laterality: N/A;  . ESOPHAGOSCOPY WITH DILITATION N/A 12/01/2017   Procedure: ESOPHAGOSCOPY WITH DILITATION;  Surgeon: [name], MD;  Location: [facility];  Service: ENT;  Laterality: N/A;  . ESOPHAGOSCOPY WITH DILITATION N/A 04/19/2018   Procedure: ESOPHAGOSCOPY WITH DILITATION;  Surgeon: [name], MD;  Location: [facility];  Service: ENT;  Laterality: N/A;  . FLEXIBLE BRONCHOSCOPY  01/08/2018      .  FOREIGN BODY REMOVAL BRONCHIAL  10/02/2011   Procedure: REMOVAL FOREIGN BODY BRONCHIAL;  Surgeon: Ruby Cola, MD;  Location: [facility];  Service: ENT;  Laterality: N/A;  . FOREIGN BODY REMOVAL BRONCHIAL N/A 01/08/2018   Procedure: REMOVAL FOREIGN BODY BRONCHIAL;  Surgeon: Jodi Marble, MD;  Location: [facility];  Service: ENT;  Laterality: N/A;  . LARYNGECTOMY    . Porta cath removal    . PORTACATH PLACEMENT  09/17/10   Tip in cavoatrial junction  . RE-EXCISION OF  BREAST CANCER,SUPERIOR MARGINS Right 07/29/2018   Procedure: RE-EXCISION OF RIGHT BREAST MEDIAL MARGINS;  Surgeon: Rolm Bookbinder, MD;  Location: [facility];  Service: General;  Laterality: Right;  . STOMAPLASTY N/A 10/21/2016   Procedure: Zola Button;  Surgeon: [name], MD;  Location: [facility];  Service: ENT;  Laterality: N/A;  . TRACHEAL DILITATION  07/16/2011   Procedure: TRACHEAL DILITATION;  Surgeon: Beckie Salts, MD;  Location: [facility];  Service: ENT;  Laterality: N/A;  dilation of tracheal stoma and replacement of stoma tube  . TUBAL LIGATION  1982    Family History  Problem Relation Age of Onset  . Heart disease Mother   . Heart disease Father   . Heart disease Sister   . Cancer Brother        type unknown  . Liver disease Maternal Grandmother   . Cancer Sister        type unknown  . Diabetes Sister   . Breast cancer Neg Hx     Social History   Tobacco Use  . Smoking status: Former Smoker    Packs/day: 2.00    Years: 20.00    Pack years: 40.00    Types: Cigarettes    Last attempt to quit: 09/17/2010    Years since quitting: 8.1  . Smokeless tobacco: Never Used  Substance Use Topics  . Alcohol use: Yes    Comment: Socially drinks   . Drug use: No    Comment: tried cocaine 1 time 2010 only used 1 time    Prior to Admission medications   Medication Sig Start Date End Date Taking? Authorizing Provider  amLODipine (NORVASC) 10 MG tablet TAKE 1 TABLET BY MOUTH ONCE DAILY Patient taking differently: Take 10 mg by mouth daily.  01/05/18   Bufford Lope, DO  anastrozole (ARIMIDEX) 1 MG tablet Take 1 tablet (1 mg total) by mouth daily. 09/06/18   Nicholas Lose, MD  buPROPion (WELLBUTRIN XL) 150 MG 24 hr tablet Take 1 tablet (150 mg total) by mouth daily. 04/08/18   Bufford Lope, DO  chlorpheniramine (CHLOR-TRIMETON) 4 MG tablet Take 4 mg by mouth daily.    [provider]  doxycycline (VIBRA-TABS) 100 MG tablet Take 100 mg by mouth 2 (two) times daily. 08/13/18    [provider]  furosemide (LASIX) 20 MG tablet TAKE 1 TABLET BY MOUTH DAILY AS NEEDED FOR  SWELLING  OR  SHORTNESS  OF  BREATH Patient taking differently: Take 20 mg by mouth daily as needed for edema.  07/14/18   [name], PA  levothyroxine (SYNTHROID, LEVOTHROID) 175 MCG tablet Take 1 tablet (175 mcg total) by mouth daily before breakfast. 07/14/18   Bufford Lope, DO  losartan (COZAAR) 100 MG tablet Take 1 tablet (100 mg total) by mouth daily. 07/14/18   Bufford Lope, DO  meloxicam (MOBIC) 15 MG tablet TAKE 1 TABLET BY MOUTH DAILY AS NEEDED 10/14/18   Loa Socks, DO  metoprolol tartrate (LOPRESSOR) 50  MG tablet Take 1 tablet by mouth twice daily 10/15/18   Tanda Rockers, MD  mometasone-formoterol (DULERA) 200-5 MCG/ACT AERO Inhale 2 puffs into the lungs 2 (two) times daily. 08/12/18   Bufford Lope, DO  nystatin cream (MYCOSTATIN) Apply 1 application topically 2 (two) times daily as needed (rash). 10/14/18   Bufford Lope, DO  omeprazole (PRILOSEC) 40 MG capsule Take 40 mg by mouth 2 (two) times daily.     [provider]  PROVENTIL HFA 108 (90 Base) MCG/ACT inhaler INHALE 2 PUFFS BY MOUTH EVERY 4 HOURS AS NEEDED OR SHORTNESS OF BREATH 09/13/18   Bufford Lope, DO  traMADol (ULTRAM) 50 MG tablet Take 1 tablet (50 mg total) by mouth every 6 (six) hours as needed. 07/07/18   Rolm Bookbinder, MD  TRANSDERM-SCOP, 1.5 MG, 1 MG/3DAYS PLACE 1 PATCH ONTO THE SKIN EVERY 3 DAYS AS NEEDED Patient not taking: PLACE 1 PATCH ONTO THE SKIN EVERY 3 DAYS AS NEEDED 07/15/18   Bufford Lope, DO    Allergies Lisinopril   REVIEW OF SYSTEMS  Negative except as noted here or in the History of Present Illness.   PHYSICAL EXAMINATION  Initial Vital Signs Blood pressure (!) 147/84, pulse 88, temperature 98.3 F (36.8 C), temperature source Oral, resp. rate (!) 36, height _0  (1.727 m), weight 107 kg, SpO2 96 %.  Examination General: Well-developed, obese female in no acute distress;  appearance consistent with age of record HENT: normocephalic; atraumatic Eyes: pupils equal, round and reactive to light; extraocular muscles intact Neck: supple; anterior stoma; no stridor Heart: regular rate and rhythm Lungs: Transmitted upper airway sounds Abdomen: soft; nondistended; nontender; bowel sounds present Extremities: No deformity; full range of motion Neurologic: Awake, alert and oriented; motor function intact in all extremities and symmetric; no facial droop Skin: Warm and dry Psychiatric: Flat affect   RESULTS  Summary of this visit's results, reviewed by myself:   EKG Interpretation  Date/Time:    Ventricular Rate:    PR Interval:    QRS Duration:   QT Interval:    QTC Calculation:   R Axis:     Text Interpretation:        Laboratory Studies: No results found for this or any previous visit (from the past 24 hour(s)). Imaging Studies: Dg Chest 2 View  Result Date: 11/17/2018 CLINICAL DATA:  Possible aspiration of tracheostomy tube EXAM: CHEST - 2 VIEW COMPARISON:  None. FINDINGS: No tracheostomy tube is visualized in the expected location or overlying the chest. There is shallow lung inflation with bibasilar atelectasis. No pleural effusion or pneumothorax. IMPRESSION: No visualized tracheostomy tube, at the expected location or elsewhere. Electronically Signed   By: Ulyses Jarred M.D.   On: 11/17/2018 01:39    ED COURSE and MDM  Nursing notes and initial vitals signs, including pulse oximetry, reviewed.  Vitals:   11/17/18 0100 11/17/18 0200 11/17/18 0300 11/17/18 0400  BP:    (!) 155/85  Pulse: 89 86 93 84  Resp:    16  Temp:      TempSrc:      SpO2: 98% 100% 100% 100%  Weight:      Height:       4:43 AM Dr. Wellington [place] has evaluated the patient and placed a temporizing red rubber catheter into her tracheoesophageal puncture pending follow-up with speech therapy later today.  PROCEDURES    ED DIAGNOSES     ICD-10-CM   1. Swallowed foreign  body, initial  encounter T18.9XXA        Nobuo Nunziata, Jenny Reichmann, MD 11/17/18 [phone]

## 2018-11-17 NOTE — ED Notes (Addendum)
Pt given and verbalized understanding of d/c instructions and need for follow up with pcp and therapists. Told to return if s/s worsen. No further distress or questions upon being taken out of the department to her husband.

## 2018-11-18 ENCOUNTER — Ambulatory Visit
Admission: RE | Admit: 2018-11-18 | Discharge: 2018-11-18 | Disposition: A | Discharge status: Home / Self Care | Payer: Medicaid Other | Source: Ambulatory Visit | Attending: Radiation Oncology | Admitting: Radiation Oncology

## 2018-11-18 ENCOUNTER — Other Ambulatory Visit: Payer: Self-pay

## 2018-11-18 DIAGNOSIS — Z930 Tracheostomy status: Secondary | ICD-10-CM | POA: Diagnosis not present

## 2018-11-18 DIAGNOSIS — D0591 Unspecified type of carcinoma in situ of right breast: Secondary | ICD-10-CM | POA: Diagnosis not present

## 2018-11-18 DIAGNOSIS — C329 Malignant neoplasm of larynx, unspecified: Secondary | ICD-10-CM | POA: Diagnosis not present

## 2018-11-18 DIAGNOSIS — Z510 Encounter for antineoplastic radiation therapy: Secondary | ICD-10-CM | POA: Diagnosis not present

## 2018-11-18 DIAGNOSIS — C321 Malignant neoplasm of supraglottis: Secondary | ICD-10-CM | POA: Diagnosis not present

## 2018-11-19 ENCOUNTER — Ambulatory Visit
Admission: RE | Admit: 2018-11-19 | Discharge: 2018-11-19 | Disposition: A | Discharge status: Home / Self Care | Payer: Medicaid Other | Source: Ambulatory Visit | Attending: Radiation Oncology | Admitting: Radiation Oncology

## 2018-11-19 ENCOUNTER — Other Ambulatory Visit: Payer: Self-pay

## 2018-11-19 DIAGNOSIS — Z510 Encounter for antineoplastic radiation therapy: Secondary | ICD-10-CM | POA: Diagnosis not present

## 2018-11-19 DIAGNOSIS — D0511 Intraductal carcinoma in situ of right breast: Secondary | ICD-10-CM | POA: Diagnosis not present

## 2018-11-19 DIAGNOSIS — D0591 Unspecified type of carcinoma in situ of right breast: Secondary | ICD-10-CM | POA: Diagnosis not present

## 2018-11-20 ENCOUNTER — Other Ambulatory Visit: Payer: Self-pay | Admitting: Family Medicine

## 2018-11-20 DIAGNOSIS — E039 Hypothyroidism, unspecified: Secondary | ICD-10-CM

## 2018-11-21 ENCOUNTER — Other Ambulatory Visit: Payer: Self-pay | Admitting: Sports Medicine

## 2018-11-22 ENCOUNTER — Other Ambulatory Visit: Payer: Self-pay

## 2018-11-22 ENCOUNTER — Other Ambulatory Visit: Payer: Self-pay | Admitting: *Deleted

## 2018-11-22 ENCOUNTER — Ambulatory Visit
Admission: RE | Admit: 2018-11-22 | Discharge: 2018-11-22 | Disposition: A | Discharge status: Home / Self Care | Payer: Medicaid Other | Source: Ambulatory Visit | Attending: Radiation Oncology | Admitting: Radiation Oncology

## 2018-11-22 DIAGNOSIS — Z448 Encounter for fitting and adjustment of other external prosthetic devices: Secondary | ICD-10-CM | POA: Diagnosis not present

## 2018-11-22 DIAGNOSIS — Z963 Presence of artificial larynx: Secondary | ICD-10-CM | POA: Diagnosis not present

## 2018-11-22 DIAGNOSIS — Z510 Encounter for antineoplastic radiation therapy: Secondary | ICD-10-CM | POA: Diagnosis not present

## 2018-11-22 DIAGNOSIS — D0591 Unspecified type of carcinoma in situ of right breast: Secondary | ICD-10-CM | POA: Diagnosis not present

## 2018-11-22 DIAGNOSIS — D0511 Intraductal carcinoma in situ of right breast: Secondary | ICD-10-CM | POA: Diagnosis not present

## 2018-11-22 DIAGNOSIS — Z8521 Personal history of malignant neoplasm of larynx: Secondary | ICD-10-CM | POA: Diagnosis not present

## 2018-11-22 DIAGNOSIS — Z79899 Other long term (current) drug therapy: Secondary | ICD-10-CM | POA: Diagnosis not present

## 2018-11-22 MED ORDER — MELOXICAM 15 MG PO TABS: ORAL_TABLET | ORAL | 0 refills | Status: DC

## 2018-11-22 MED ORDER — LORATADINE 10 MG PO TABS: 10.0000 mg | ORAL_TABLET | Freq: Every day | ORAL | 11 refills | Status: DC

## 2018-11-22 NOTE — Telephone Encounter (Signed)
Appears to have change from levothyroxine. Please inform patient that she needs to come in for in person visit to have TSH checked in 4-6 weeks as thyroid medication can be very variable and will need to be titrated.

## 2018-11-23 ENCOUNTER — Ambulatory Visit
Admission: RE | Admit: 2018-11-23 | Discharge: 2018-11-23 | Disposition: A | Discharge status: Home / Self Care | Payer: Medicaid Other | Source: Ambulatory Visit | Attending: Radiation Oncology | Admitting: Radiation Oncology

## 2018-11-23 ENCOUNTER — Encounter: Payer: Self-pay | Admitting: Family Medicine

## 2018-11-23 ENCOUNTER — Other Ambulatory Visit: Payer: Self-pay

## 2018-11-23 ENCOUNTER — Ambulatory Visit (INDEPENDENT_AMBULATORY_CARE_PROVIDER_SITE_OTHER): Payer: Medicaid Other | Admitting: Family Medicine

## 2018-11-23 VITALS — BP 150/90 | HR 84 | Ht 68.0 in | Wt 235.1 lb

## 2018-11-23 DIAGNOSIS — B9789 Other viral agents as the cause of diseases classified elsewhere: Secondary | ICD-10-CM | POA: Diagnosis not present

## 2018-11-23 DIAGNOSIS — J069 Acute upper respiratory infection, unspecified: Secondary | ICD-10-CM

## 2018-11-23 DIAGNOSIS — J4520 Mild intermittent asthma, uncomplicated: Secondary | ICD-10-CM | POA: Diagnosis not present

## 2018-11-23 DIAGNOSIS — D0511 Intraductal carcinoma in situ of right breast: Secondary | ICD-10-CM | POA: Diagnosis not present

## 2018-11-23 DIAGNOSIS — D0591 Unspecified type of carcinoma in situ of right breast: Secondary | ICD-10-CM | POA: Diagnosis not present

## 2018-11-23 DIAGNOSIS — Z510 Encounter for antineoplastic radiation therapy: Secondary | ICD-10-CM | POA: Diagnosis not present

## 2018-11-23 MED ORDER — ALBUTEROL SULFATE (2.5 MG/3ML) 0.083% IN NEBU: 2.5000 mg | INHALATION_SOLUTION | Freq: Four times a day (QID) | RESPIRATORY_TRACT | 1 refills | Status: DC | PRN

## 2018-11-23 NOTE — Patient Instructions (Signed)
Let us know if you worsen or if you stay sick for the next week.    Viral Respiratory Infection A viral respiratory infection is an illness that affects parts of the body that are used for breathing. These include the lungs, nose, and throat. It is caused by a germ called a virus. Some examples of this kind of infection are:  A cold.  The flu (influenza).  A respiratory syncytial virus (RSV) infection. A person who gets this illness may have the following symptoms:  A stuffy or runny nose.  Yellow or green fluid in the nose.  A cough.  Sneezing.  Tiredness (fatigue).  Achy muscles.  A sore throat.  Sweating or chills.  A fever.  A headache. Follow these instructions at home: Managing pain and congestion  Take over-the-counter and prescription medicines only as told by your doctor.  If you have a sore throat, gargle with salt water. Do this 3-4 times per day or as needed. To make a salt-water mixture, dissolve -1 tsp of salt in 1 cup of warm water. Make sure that all the salt dissolves.  Use nose drops made from salt water. This helps with stuffiness (congestion). It also helps soften the skin around your nose.  Drink enough fluid to keep your pee (urine) pale yellow. General instructions   Rest as much as possible.  Do not drink alcohol.  Do not use any products that have nicotine or tobacco, such as cigarettes and e-cigarettes. If you need help quitting, ask your doctor.  Keep all follow-up visits as told by your doctor. This is important. How is this prevented?   Get a flu shot every year. Ask your doctor when you should get your flu shot.  Do not let other people get your germs. If you are sick: ? Stay home from work or school. ? Wash your hands with soap and water often. Wash your hands after you cough or sneeze. If soap and water are not available, use hand sanitizer.  Avoid contact with people who are sick during cold and flu season. This is in  fall and winter. Get help if:  Your symptoms last for 10 days or longer.  Your symptoms get worse over time.  You have a fever.  You have very bad pain in your face or forehead.  Parts of your jaw or neck become very swollen. Get help right away if:  You feel pain or pressure in your chest.  You have shortness of breath.  You faint or feel like you will faint.  You keep throwing up (vomiting).  You feel confused. Summary  A viral respiratory infection is an illness that affects parts of the body that are used for breathing.  Examples of this illness include a cold, the flu, and respiratory syncytial virus (RSV) infection.  The infection can cause a runny nose, cough, sneezing, sore throat, and fever.  Follow what your doctor tells you about taking medicines, drinking lots of fluid, washing your hands, resting at home, and avoiding people who are sick. This information is not intended to replace advice given to you by your health care provider. Make sure you discuss any questions you have with your health care provider. Document Released: 05/15/2008 Document Revised: 07/13/2017 Document Reviewed: 07/13/2017 Elsevier Interactive Patient Education  2019 [employer].

## 2018-11-23 NOTE — Progress Notes (Signed)
    Subjective:  Deborah [name] is a 63 y.o. female who presents to the [facility] today with a chief complaint of wheezing.   HPI:  Patient states that she has been having some wheezing and nasal congestion for the past 1 week. She was seen in the ER during this time as she had also swallowed her prosthesis and was COVID neg. She has had no fever or cough. No SOB. She has been trying to use her home albuterol inhaler but feels like she can't inhale all of the medication through her trach. She does not have a nebulizer at home.  No sputum purulence. No double worsening. Did not take her blood pressure medication this morning  ROS: Per HPI  CC, SH/smoking status, and VS noted  Objective:  Physical Exam: BP (!) 150/90   Pulse 84   Ht 5\' 8"  (1.727 m)   Wt 235 lb 2 oz (106.7 kg)   SpO2 94%   BMI 35.75 kg/m   Gen: NAD, resting comfortably CV: RRR with no murmurs appreciated Pulm: NWOB on RA, diffuse wheezing but with good air movement throughout. no crackles, wheezes, or rhonchi Skin: warm, dry Neuro: grossly normal, moves all extremities Psych: Normal affect and thought content   Assessment/Plan:  1. Viral URI with cough Patient is well appearing, afebrile, and good O2 sat on RA. She does have some diffuse wheezing but is moving good air. She was COVID neg on 11/17/18. Patient was given a nebulizer machine in clinic today. Reviewed return precautions.  Meds ordered this encounter  Medications  . albuterol (PROVENTIL) (2.5 MG/3ML) 0.083% nebulizer solution    Sig: Take 3 mLs (2.5 mg total) by nebulization every 6 (six) hours as needed for wheezing or shortness of breath.    Dispense:  150 mL    Refill:  [place], DO PGY-3, [facility] 11/23/2018 11:31 AM

## 2018-11-23 NOTE — Telephone Encounter (Signed)
LM for patient to call back and schedule an appointment to follow up on her thyroid.  Rikki Trosper,CMA

## 2018-11-24 ENCOUNTER — Other Ambulatory Visit: Payer: Self-pay

## 2018-11-24 ENCOUNTER — Ambulatory Visit
Admission: RE | Admit: 2018-11-24 | Discharge: 2018-11-24 | Disposition: A | Discharge status: Home / Self Care | Payer: Medicaid Other | Source: Ambulatory Visit | Attending: Radiation Oncology | Admitting: Radiation Oncology

## 2018-11-24 DIAGNOSIS — D0591 Unspecified type of carcinoma in situ of right breast: Secondary | ICD-10-CM | POA: Diagnosis not present

## 2018-11-24 DIAGNOSIS — D0511 Intraductal carcinoma in situ of right breast: Secondary | ICD-10-CM | POA: Diagnosis not present

## 2018-11-24 DIAGNOSIS — Z510 Encounter for antineoplastic radiation therapy: Secondary | ICD-10-CM | POA: Diagnosis not present

## 2018-11-25 ENCOUNTER — Other Ambulatory Visit: Payer: Self-pay

## 2018-11-25 ENCOUNTER — Ambulatory Visit
Admission: RE | Admit: 2018-11-25 | Discharge: 2018-11-25 | Disposition: A | Discharge status: Home / Self Care | Payer: Medicaid Other | Source: Ambulatory Visit | Attending: Radiation Oncology | Admitting: Radiation Oncology

## 2018-11-25 DIAGNOSIS — D0591 Unspecified type of carcinoma in situ of right breast: Secondary | ICD-10-CM | POA: Diagnosis not present

## 2018-11-25 DIAGNOSIS — Z510 Encounter for antineoplastic radiation therapy: Secondary | ICD-10-CM | POA: Diagnosis not present

## 2018-11-26 ENCOUNTER — Ambulatory Visit
Admission: RE | Admit: 2018-11-26 | Discharge: 2018-11-26 | Disposition: A | Discharge status: Home / Self Care | Payer: Medicaid Other | Source: Ambulatory Visit | Attending: Radiation Oncology | Admitting: Radiation Oncology

## 2018-11-26 ENCOUNTER — Other Ambulatory Visit: Payer: Self-pay

## 2018-11-26 DIAGNOSIS — Z510 Encounter for antineoplastic radiation therapy: Secondary | ICD-10-CM | POA: Diagnosis not present

## 2018-11-26 DIAGNOSIS — D0591 Unspecified type of carcinoma in situ of right breast: Secondary | ICD-10-CM | POA: Diagnosis not present

## 2018-11-26 DIAGNOSIS — D0511 Intraductal carcinoma in situ of right breast: Secondary | ICD-10-CM | POA: Diagnosis not present

## 2018-11-29 ENCOUNTER — Ambulatory Visit
Admission: RE | Admit: 2018-11-29 | Discharge: 2018-11-29 | Disposition: A | Discharge status: Home / Self Care | Payer: Medicaid Other | Source: Ambulatory Visit | Attending: Radiation Oncology | Admitting: Radiation Oncology

## 2018-11-29 ENCOUNTER — Other Ambulatory Visit: Payer: Self-pay

## 2018-11-29 DIAGNOSIS — D0511 Intraductal carcinoma in situ of right breast: Secondary | ICD-10-CM | POA: Diagnosis not present

## 2018-11-29 DIAGNOSIS — D0591 Unspecified type of carcinoma in situ of right breast: Secondary | ICD-10-CM | POA: Diagnosis not present

## 2018-11-29 DIAGNOSIS — Z510 Encounter for antineoplastic radiation therapy: Secondary | ICD-10-CM | POA: Diagnosis not present

## 2018-11-30 ENCOUNTER — Other Ambulatory Visit: Payer: Self-pay

## 2018-11-30 ENCOUNTER — Ambulatory Visit
Admission: RE | Admit: 2018-11-30 | Discharge: 2018-11-30 | Disposition: A | Discharge status: Home / Self Care | Payer: Medicaid Other | Source: Ambulatory Visit | Attending: Radiation Oncology | Admitting: Radiation Oncology

## 2018-11-30 DIAGNOSIS — D0591 Unspecified type of carcinoma in situ of right breast: Secondary | ICD-10-CM | POA: Diagnosis not present

## 2018-11-30 DIAGNOSIS — D0511 Intraductal carcinoma in situ of right breast: Secondary | ICD-10-CM | POA: Diagnosis not present

## 2018-11-30 DIAGNOSIS — Z510 Encounter for antineoplastic radiation therapy: Secondary | ICD-10-CM | POA: Diagnosis not present

## 2018-12-01 ENCOUNTER — Ambulatory Visit
Admission: RE | Admit: 2018-12-01 | Discharge: 2018-12-01 | Disposition: A | Discharge status: Home / Self Care | Payer: Medicaid Other | Source: Ambulatory Visit | Attending: Radiation Oncology | Admitting: Radiation Oncology

## 2018-12-01 ENCOUNTER — Other Ambulatory Visit: Payer: Self-pay

## 2018-12-01 DIAGNOSIS — Z510 Encounter for antineoplastic radiation therapy: Secondary | ICD-10-CM | POA: Diagnosis not present

## 2018-12-01 DIAGNOSIS — D0511 Intraductal carcinoma in situ of right breast: Secondary | ICD-10-CM | POA: Diagnosis not present

## 2018-12-01 DIAGNOSIS — D0591 Unspecified type of carcinoma in situ of right breast: Secondary | ICD-10-CM | POA: Diagnosis not present

## 2018-12-02 ENCOUNTER — Ambulatory Visit
Admission: RE | Admit: 2018-12-02 | Discharge: 2018-12-02 | Disposition: A | Discharge status: Home / Self Care | Payer: Medicaid Other | Source: Ambulatory Visit | Attending: Radiation Oncology | Admitting: Radiation Oncology

## 2018-12-02 ENCOUNTER — Other Ambulatory Visit: Payer: Self-pay

## 2018-12-02 DIAGNOSIS — Z930 Tracheostomy status: Secondary | ICD-10-CM | POA: Diagnosis not present

## 2018-12-02 DIAGNOSIS — C321 Malignant neoplasm of supraglottis: Secondary | ICD-10-CM | POA: Diagnosis not present

## 2018-12-02 DIAGNOSIS — C329 Malignant neoplasm of larynx, unspecified: Secondary | ICD-10-CM | POA: Diagnosis not present

## 2018-12-02 DIAGNOSIS — Z510 Encounter for antineoplastic radiation therapy: Secondary | ICD-10-CM | POA: Diagnosis not present

## 2018-12-02 DIAGNOSIS — D0591 Unspecified type of carcinoma in situ of right breast: Secondary | ICD-10-CM | POA: Diagnosis not present

## 2018-12-03 ENCOUNTER — Other Ambulatory Visit: Payer: Self-pay

## 2018-12-03 ENCOUNTER — Ambulatory Visit
Admission: RE | Admit: 2018-12-03 | Discharge: 2018-12-03 | Disposition: A | Discharge status: Home / Self Care | Payer: Medicaid Other | Source: Ambulatory Visit | Attending: Radiation Oncology | Admitting: Radiation Oncology

## 2018-12-03 DIAGNOSIS — Z510 Encounter for antineoplastic radiation therapy: Secondary | ICD-10-CM | POA: Diagnosis not present

## 2018-12-03 DIAGNOSIS — D0511 Intraductal carcinoma in situ of right breast: Secondary | ICD-10-CM | POA: Diagnosis not present

## 2018-12-03 DIAGNOSIS — D0591 Unspecified type of carcinoma in situ of right breast: Secondary | ICD-10-CM | POA: Diagnosis not present

## 2018-12-06 ENCOUNTER — Encounter: Payer: Self-pay | Admitting: *Deleted

## 2018-12-06 ENCOUNTER — Other Ambulatory Visit: Payer: Self-pay

## 2018-12-06 ENCOUNTER — Ambulatory Visit
Admission: RE | Admit: 2018-12-06 | Discharge: 2018-12-06 | Disposition: A | Discharge status: Home / Self Care | Payer: Medicaid Other | Source: Ambulatory Visit | Attending: Radiation Oncology | Admitting: Radiation Oncology

## 2018-12-06 DIAGNOSIS — Z510 Encounter for antineoplastic radiation therapy: Secondary | ICD-10-CM | POA: Diagnosis not present

## 2018-12-06 DIAGNOSIS — D0591 Unspecified type of carcinoma in situ of right breast: Secondary | ICD-10-CM | POA: Diagnosis not present

## 2018-12-10 ENCOUNTER — Encounter: Payer: Self-pay | Admitting: Radiation Oncology

## 2018-12-10 DIAGNOSIS — Z930 Tracheostomy status: Secondary | ICD-10-CM | POA: Diagnosis not present

## 2018-12-10 DIAGNOSIS — C321 Malignant neoplasm of supraglottis: Secondary | ICD-10-CM | POA: Diagnosis not present

## 2018-12-10 DIAGNOSIS — C329 Malignant neoplasm of larynx, unspecified: Secondary | ICD-10-CM | POA: Diagnosis not present

## 2018-12-18 ENCOUNTER — Other Ambulatory Visit: Payer: Self-pay | Admitting: Sports Medicine

## 2018-12-18 ENCOUNTER — Other Ambulatory Visit: Payer: Self-pay | Admitting: Physician Assistant

## 2018-12-22 NOTE — Progress Notes (Signed)
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  Cancer Staging: Cancer Staging Ductal carcinoma in situ (DCIS) of right breast Staging form: Breast, AJCC 8th Edition - Clinical: Stage 0 (cTis (DCIS), cN0, cM0, ER+, PR+, HER2: Not Assessed) - Signed by Nicholas Lose, MD on 06/02/2018 - Pathologic: Stage 0 (pTis (DCIS), pN0, cM0, ER+, PR+) - Unsigned  History of head and neck cancer Staging form: Larynx - Supraglottis, AJCC 7th Edition - Clinical: Stage IVA (T4a, N2, M0) - Signed by Heath Lark, MD on 07/28/2013   Intent: Curative  Radiation Treatment Dates: 11/15/2018 through 12/06/2018 Site Technique Total Dose Dose per Fx Completed Fx Beam Energies  Breast: Breast_Rt 3D 42.56/42.56 2.66 16/16 10X   Narrative: The patient tolerated radiation therapy relatively well.   Plan: The patient will follow-up with radiation oncology in 32mo.   -----------------------------------  SEppie Gibson MD

## 2018-12-31 DIAGNOSIS — Z08 Encounter for follow-up examination after completed treatment for malignant neoplasm: Secondary | ICD-10-CM | POA: Diagnosis not present

## 2018-12-31 DIAGNOSIS — Z9002 Acquired absence of larynx: Secondary | ICD-10-CM | POA: Diagnosis not present

## 2018-12-31 DIAGNOSIS — Z8521 Personal history of malignant neoplasm of larynx: Secondary | ICD-10-CM | POA: Diagnosis not present

## 2019-01-04 DIAGNOSIS — C329 Malignant neoplasm of larynx, unspecified: Secondary | ICD-10-CM | POA: Diagnosis not present

## 2019-01-04 DIAGNOSIS — C321 Malignant neoplasm of supraglottis: Secondary | ICD-10-CM | POA: Diagnosis not present

## 2019-01-04 DIAGNOSIS — Z930 Tracheostomy status: Secondary | ICD-10-CM | POA: Diagnosis not present

## 2019-01-05 ENCOUNTER — Ambulatory Visit
Admission: RE | Admit: 2019-01-05 | Discharge: 2019-01-05 | Disposition: A | Discharge status: Home / Self Care | Payer: Medicaid Other | Source: Ambulatory Visit | Attending: Radiation Oncology | Admitting: Radiation Oncology

## 2019-01-05 ENCOUNTER — Encounter: Payer: Self-pay | Admitting: Radiation Oncology

## 2019-01-05 ENCOUNTER — Other Ambulatory Visit: Payer: Self-pay

## 2019-01-05 DIAGNOSIS — Z79899 Other long term (current) drug therapy: Secondary | ICD-10-CM | POA: Diagnosis not present

## 2019-01-05 DIAGNOSIS — D0511 Intraductal carcinoma in situ of right breast: Secondary | ICD-10-CM | POA: Insufficient documentation

## 2019-01-05 DIAGNOSIS — Z923 Personal history of irradiation: Secondary | ICD-10-CM | POA: Insufficient documentation

## 2019-01-05 HISTORY — DX: Personal history of irradiation: Z92.3

## 2019-01-05 NOTE — Progress Notes (Signed)
Radiation Oncology         [phone] ________________________________  Name: Deborah [name] MRN: [id number]  Date: 01/05/2019  DOB: [birth year]  Follow-Up Visit Note in person  Outpatient  CC: Benay Pike, MD  Nicholas Lose, MD  Diagnosis and Prior Radiotherapy:    ICD-10-CM   1. 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     CHIEF COMPLAINT: Here for follow-up and surveillance of DCIS  Narrative:  The patient returns today for routine follow-up.   [name] presents for follow up of radiation completed 12/06/18 to her Right Breast. She has hyperpigmentation present to her right breast. She has scarring from peeling to her right axilla and under her right breast. She continues to apply radiaplex to her radiation site.  She will see Wilber Bihari NP on mychart video on 01/11/19.   BP (!) 148/90 (BP Location: Left Arm, Patient Position: Sitting)   Pulse (!) 102   Temp 98.9 F (37.2 C) (Temporal)   Resp 20   Ht 5\' 8"  (1.727 m)   Wt 230 lb 2 oz (104.4 kg)   SpO2 93%   BMI 34.99 kg/m    Wt Readings from Last 3 Encounters:  01/05/19 230 lb 2 oz (104.4 kg)  11/23/18 235 lb 2 oz (106.7 kg)  11/16/18 235 lb 14.3 oz (107 kg)                                ALLERGIES:  is allergic to lisinopril.  Meds: Current Outpatient Medications  Medication Sig Dispense Refill  . albuterol (PROVENTIL) (2.5 MG/3ML) 0.083% nebulizer solution Take 3 mLs (2.5 mg total) by nebulization every 6 (six) hours as needed for wheezing or shortness of breath. 150 mL 1  . amLODipine (NORVASC) 10 MG tablet TAKE 1 TABLET BY MOUTH ONCE DAILY (Patient taking differently: Take 10 mg by mouth daily. ) 90 tablet 3  . anastrozole (ARIMIDEX) 1 MG tablet Take 1 tablet (1 mg total) by mouth daily. 90 tablet 3  . buPROPion (WELLBUTRIN XL) 150 MG 24 hr tablet Take 1 tablet (150 mg total) by mouth daily. 90 tablet 3  . chlorpheniramine (CHLOR-TRIMETON) 4 MG tablet Take 4 mg by mouth daily.     [name] doxycycline (VIBRA-TABS) 100 MG tablet Take 100 mg by mouth 2 (two) times daily.    Arna Medici 175 MCG tablet TAKE 1 TABLET BY MOUTH  DAILY BEFORE BREAKFAST 30 tablet 0  . furosemide (LASIX) 20 MG tablet TAKE 1 TABLET BY MOUTH ONCE DAILY AS NEEDED FOR  SWELLING  OR  SHORTNESS  OF  BREATH. 30 tablet 0  . loratadine (CLARITIN) 10 MG tablet Take 1 tablet (10 mg total) by mouth daily. 30 tablet 11  . losartan (COZAAR) 100 MG tablet Take 1 tablet (100 mg total) by mouth daily. 30 tablet 3  . meloxicam (MOBIC) 15 MG tablet TAKE 1 TABLET BY MOUTH ONCE DAILY AS NEEDED 30 tablet 0  . metoprolol tartrate (LOPRESSOR) 50 MG tablet Take 1 tablet by mouth twice daily 60 tablet 0  . mometasone-formoterol (DULERA) 200-5 MCG/ACT AERO Inhale 2 puffs into the lungs 2 (two) times daily. 13 g 3  . nystatin cream (MYCOSTATIN) Apply 1 application topically 2 (two) times daily as needed (rash). 30 g 0  . omeprazole (PRILOSEC) 40 MG capsule Take 40 mg by mouth 2 (two) times daily.     [name] PROVENTIL  HFA 108 (90 Base) MCG/ACT inhaler INHALE 2 PUFFS BY MOUTH EVERY 4 HOURS AS NEEDED OR SHORTNESS OF BREATH 14 g 2  . traMADol (ULTRAM) 50 MG tablet Take 1 tablet (50 mg total) by mouth every 6 (six) hours as needed. (Patient not taking: Reported on 01/05/2019) 10 tablet 0  . TRANSDERM-SCOP, 1.5 MG, 1 MG/3DAYS PLACE 1 PATCH ONTO THE SKIN EVERY 3 DAYS AS NEEDED (Patient not taking: PLACE 1 PATCH ONTO THE SKIN EVERY 3 DAYS AS NEEDED) 10 patch 2   No current facility-administered medications for this encounter.     Physical Findings: The patient is in no acute distress. Patient is alert and oriented.  height is 5\' 8"  (1.727 m) and weight is 230 lb 2 oz (104.4 kg). Her temporal temperature is 98.9 F (37.2 C). Her blood pressure is 148/90 (abnormal) and her pulse is 102 (abnormal). Her respiration is 20 and oxygen saturation is 93%. .    Satisfactory skin healing in radiotherapy fields. Continued residual hyperpigmentation  diffusely with hypopigmentation where her skin has peeled.  Skin is without any oozing   Lab Findings: Lab Results  Component Value Date   WBC 5.0 07/29/2018   HGB 11.1 (L) 07/29/2018   HCT 36.3 07/29/2018   MCV 93.3 07/29/2018   PLT 292 07/29/2018    Radiographic Findings: No results found.  Impression/Plan: Healing well from radiotherapy to the breast tissue.  Continue skin care with topical Vitamin E Oil and / or lotion for at least 2 more months for further healing.  I encouraged her to continue with yearly mammography as appropriate (for intact breast tissue) and followup with medical oncology. I will see her back on an as-needed basis. I have encouraged her to call if she has any issues or concerns in the future. I wished her the very best.     _____________________________________   Eppie Gibson, MD

## 2019-01-11 ENCOUNTER — Inpatient Hospital Stay: Payer: Medicaid Other | Attending: Hematology and Oncology | Admitting: Adult Health

## 2019-01-11 ENCOUNTER — Encounter: Payer: Self-pay | Admitting: Adult Health

## 2019-01-11 DIAGNOSIS — D0511 Intraductal carcinoma in situ of right breast: Secondary | ICD-10-CM

## 2019-01-11 DIAGNOSIS — Z87891 Personal history of nicotine dependence: Secondary | ICD-10-CM

## 2019-01-11 DIAGNOSIS — E2839 Other primary ovarian failure: Secondary | ICD-10-CM

## 2019-01-11 NOTE — Progress Notes (Signed)
SURVIVORSHIP VIRTUAL VISIT:  I connected with Deborah [name] on 01/11/19 at  2:00 PM EDT by my chart video and due to technical difficulties, we had to convert to telephone verified that I am speaking with the correct person using two identifiers.  I discussed the limitations, risks, security and privacy concerns of performing an evaluation and management service by telephone and the availability of in person appointments. I also discussed with the patient that there may be a patient responsible charge related to this service. The patient expressed understanding and agreed to proceed.   BRIEF ONCOLOGIC HISTORY:  Oncology History  Ductal carcinoma in situ (DCIS) of right breast  05/26/2018 Initial Diagnosis   History of laryngeal cancer treated with chemoradiation, screening detected right breast calcifications medial right breast measuring 1.9 cm, biopsy revealed high-grade DCIS with comedonecrosis and calcifications ER 90%, PR 90%, Tis NX stage 0   06/02/2018 Cancer Staging   Staging form: Breast, AJCC 8th Edition - Clinical: Stage 0 (cTis (DCIS), cN0, cM0, ER+, PR+, HER2: Not Assessed) - Signed by Nicholas Lose, MD on 06/02/2018   07/06/2018 Surgery   Right lumpectomy: No residual carcinoma identified, right medial margin excision: DCIS high-grade 1.5 cm margins negative, additional margins benign, ER 90%, PR 90%, Tis NX stage 0   09/06/2018 -  Anti-estrogen oral therapy   Anastrozole 31m daily     INTERVAL HISTORY:  Deborah [name] review her survivorship care plan detailing her treatment course for breast cancer, as well as monitoring long-term side effects of that treatment, education regarding health maintenance, screening, and overall wellness and health promotion.     Overall, Deborah [name] reports feeling quite well.  She denies any side effects taking Anastrozole.  She denies any hot flashes, vaginal dryness, or joint aches/pains.  She notes she has difficulty breathing, but  this is related to her h/o head and neck cancer requiring trachelectomy.  REVIEW OF SYSTEMS:  Review of Systems  Constitutional: Negative for appetite change, chills, fatigue and unexpected weight change.  HENT:   Negative for hearing loss, lump/mass, sore throat and trouble swallowing.   Respiratory: Positive for shortness of breath (at baseline). Negative for chest tightness and cough.   Cardiovascular: Negative for chest pain, leg swelling and palpitations.  Gastrointestinal: Negative for abdominal distention, abdominal pain, blood in stool, constipation, diarrhea, nausea and vomiting.  Endocrine: Negative for hot flashes.  Musculoskeletal: Negative for arthralgias.  Skin: Negative for itching and rash.  Neurological: Negative for dizziness, extremity weakness, headaches and numbness.  Hematological: Negative for adenopathy.  Psychiatric/Behavioral: Negative for depression. The patient is not nervous/anxious.   Breast: Denies any new nodularity, masses, tenderness, nipple changes, or nipple discharge.     ONCOLOGY TREATMENT TEAM:  1. Surgeon[facility]  [facility] 2. Medical Oncologist: Dr. GLindi Adie 3. Radiation Oncologist: Dr. SIsidore Moos   PAST MEDICAL/SURGICAL HISTORY:  Past Medical History:  Diagnosis Date  . Anemia   . Arthritis    in knees  . Asthma   . Breast cancer ([place]   . Bronchitis   . COPD (chronic obstructive pulmonary disease) ([facility]   . Diverticulosis 11/15/2012    noted on screening colonoscopy   . Dyspnea    with COPD exerbation  . Esophageal stricture   . Former smoker 03/19/2011  . GERD (gastroesophageal reflux disease)   . Heart murmur    asymptomatic   . History of laryngectomy   . History of radiation therapy 11/15/18- 12/06/18   Right  Breast total dose 42.56 Gy in 16 fractions.   [name] Hx of radiation therapy 09/03/10 to 10/16/2010   supraglottic larynx  . Hypertension   . 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 Internal hemorrhoid 11/15/2012     small, noted on screening colonoscopy   . 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  . Leukocytopenia   . Nausea alone 07/28/2013  . Neck pain 01/21/2012  . Normal MRI 07/14/11   negative for mestasis   . Pneumonia 2012  . Sciatica   . Seizures ([place])    07/24/11 off Effexor w/o seizure  . Sepsis ([place]) 08/04/12  . Sinusitis, chronic 07/20/2011   Bilateral maxillary, identified on MRI of head 07/14/11.    . Tracheostomy dependent [facility])    Past Surgical History:  Procedure Laterality Date  . BREAST LUMPECTOMY Right 07/06/2018  . BREAST LUMPECTOMY WITH RADIOACTIVE SEED LOCALIZATION Right 07/06/2018   Procedure: RIGHT BREAST LUMPECTOMY WITH RADIOACTIVE SEED LOCALIZATION;  Surgeon: Rolm Bookbinder, MD;  Location: [place];  Service: General;  Laterality: Right;  . COLONOSCOPY N/A 11/15/2012   Procedure: COLONOSCOPY;  Surgeon: Lafayette Dragon, MD;  Location: [facility];  Service: Endoscopy;  Laterality: N/A;  . DENTAL RESTORATION/EXTRACTION WITH X-RAY    . ESOPHAGOGASTRODUODENOSCOPY (EGD) WITH PROPOFOL N/A 08/12/2018   Procedure: ESOPHAGOGASTRODUODENOSCOPY (EGD) WITH PROPOFOL;  Surgeon: Doran Stabler, MD;  Location: [place];  Service: Gastroenterology;  Laterality: N/A;  . ESOPHAGOSCOPY  06/21/2012   Procedure: ESOPHAGOSCOPY;  Surgeon: [name], MD;  Location: [place];  Service: ENT;  Laterality: N/A;  . ESOPHAGOSCOPY WITH DILITATION N/A 09/21/2014   Procedure: ESOPHAGOSCOPY WITH DILITATION;  Surgeon: [name], MD;  Location: [place];  Service: ENT;  Laterality: N/A;  . ESOPHAGOSCOPY WITH DILITATION N/A 07/04/2016   Procedure: ESOPHAGOSCOPY WITH DILITATION;  Surgeon: [name], MD;  Location: [facility];  Service: ENT;  Laterality: N/A;  . ESOPHAGOSCOPY WITH DILITATION N/A 12/01/2017   Procedure: ESOPHAGOSCOPY WITH DILITATION;  Surgeon: [name], MD;  Location: [place];  Service: ENT;  Laterality: N/A;  . ESOPHAGOSCOPY WITH  DILITATION N/A 04/19/2018   Procedure: ESOPHAGOSCOPY WITH DILITATION;  Surgeon: [name], MD;  Location: [place];  Service: ENT;  Laterality: N/A;  . FLEXIBLE BRONCHOSCOPY  01/08/2018      . FOREIGN BODY REMOVAL BRONCHIAL  10/02/2011   Procedure: REMOVAL FOREIGN BODY BRONCHIAL;  Surgeon: Ruby Cola, MD;  Location: [place];  Service: ENT;  Laterality: N/A;  . FOREIGN BODY REMOVAL BRONCHIAL N/A 01/08/2018   Procedure: REMOVAL FOREIGN BODY BRONCHIAL;  Surgeon: Jodi Marble, MD;  Location[facility];  Service: ENT;  Laterality: N/A;  . LARYNGECTOMY    . Porta cath removal    . PORTACATH PLACEMENT  09/17/10   Tip in cavoatrial junction  . RE-EXCISION OF BREAST CANCER,SUPERIOR MARGINS Right 07/29/2018   Procedure: RE-EXCISION OF RIGHT BREAST MEDIAL MARGINS;  Surgeon: Rolm Bookbinder, MD;  Location: [place];  Service: General;  Laterality: Right;  . STOMAPLASTY N/A 10/21/2016   Procedure: Zola Button;  Surgeon: [name], MD;  Location: [place];  Service: ENT;  Laterality: N/A;  . TRACHEAL DILITATION  07/16/2011   Procedure: TRACHEAL DILITATION;  Surgeon: Beckie Salts, MD;  Location: [place];  Service: ENT;  Laterality: N/A;  dilation of tracheal stoma and replacement of stoma tube  . TUBAL LIGATION  1982     ALLERGIES:  Allergies  Allergen Reactions  . Lisinopril Cough  CURRENT MEDICATIONS:  Outpatient Encounter Medications as of 01/11/2019  Medication Sig  . albuterol (PROVENTIL) (2.5 MG/3ML) 0.083% nebulizer solution Take 3 mLs (2.5 mg total) by nebulization every 6 (six) hours as needed for wheezing or shortness of breath.  [name] amLODipine (NORVASC) 10 MG tablet TAKE 1 TABLET BY MOUTH ONCE DAILY (Patient taking differently: Take 10 mg by mouth daily. )  . anastrozole (ARIMIDEX) 1 MG tablet Take 1 tablet (1 mg total) by mouth daily.  [name] buPROPion (WELLBUTRIN XL) 150 MG 24 hr tablet Take 1 tablet (150 mg total) by mouth daily.  . chlorpheniramine (CHLOR-TRIMETON) 4 MG tablet Take 4 mg by mouth  daily.  [name] doxycycline (VIBRA-TABS) 100 MG tablet Take 100 mg by mouth 2 (two) times daily.  . EUTHYROX 175 MCG tablet TAKE 1 TABLET BY MOUTH  DAILY BEFORE BREAKFAST  . furosemide (LASIX) 20 MG tablet TAKE 1 TABLET BY MOUTH ONCE DAILY AS NEEDED FOR  SWELLING  OR  SHORTNESS  OF  BREATH.  [name] loratadine (CLARITIN) 10 MG tablet Take 1 tablet (10 mg total) by mouth daily.  [name] losartan (COZAAR) 100 MG tablet Take 1 tablet (100 mg total) by mouth daily.  . meloxicam (MOBIC) 15 MG tablet TAKE 1 TABLET BY MOUTH ONCE DAILY AS NEEDED  . metoprolol tartrate (LOPRESSOR) 50 MG tablet Take 1 tablet by mouth twice daily  . mometasone-formoterol (DULERA) 200-5 MCG/ACT AERO Inhale 2 puffs into the lungs 2 (two) times daily.  [name] nystatin cream (MYCOSTATIN) Apply 1 application topically 2 (two) times daily as needed (rash).  [name] omeprazole (PRILOSEC) 40 MG capsule Take 40 mg by mouth 2 (two) times daily.   [name] PROVENTIL HFA 108 (90 Base) MCG/ACT inhaler INHALE 2 PUFFS BY MOUTH EVERY 4 HOURS AS NEEDED OR SHORTNESS OF BREATH  . traMADol (ULTRAM) 50 MG tablet Take 1 tablet (50 mg total) by mouth every 6 (six) hours as needed. (Patient not taking: Reported on 01/05/2019)  . TRANSDERM-SCOP, 1.5 MG, 1 MG/3DAYS PLACE 1 PATCH ONTO THE SKIN EVERY 3 DAYS AS NEEDED (Patient not taking: PLACE 1 PATCH ONTO THE SKIN EVERY 3 DAYS AS NEEDED)   No facility-administered encounter medications on file as of 01/11/2019.      ONCOLOGIC FAMILY HISTORY:  Family History  Problem Relation Age of Onset  . Heart disease Mother   . Heart disease Father   . Heart disease Sister   . Cancer Brother        type unknown  . Liver disease Maternal Grandmother   . Cancer Sister        type unknown  . Diabetes Sister   . Breast cancer Neg Hx      GENETIC COUNSELING/TESTING: Not at this time  SOCIAL HISTORY:  Social History   Socioeconomic History  . Marital status: Divorced    Spouse name: Not on file  . Number of children: 2  . Years of  education: 66  . Highest education level: Not on file  Occupational History  . Occupation: part time    Comment: warehouse work  Scientific laboratory technician  . Financial resource strain: Not on file  . Food insecurity    Worry: Not on file    Inability: Not on file  . Transportation needs    Medical: No    Non-medical: No  Tobacco Use  . Smoking status: Former Smoker    Packs/day: 2.00    Years: 20.00    Pack years: 40.00    Types: Cigarettes  Quit date: 09/17/2010    Years since quitting: 8.3  . Smokeless tobacco: Never Used  Substance and Sexual Activity  . Alcohol use: Yes    Comment: Socially drinks   . Drug use: No    Comment: tried cocaine 1 time 2010 only used 1 time  . Sexual activity: Yes    Birth control/protection: Surgical  Lifestyle  . Physical activity    Days per week: Not on file    Minutes per session: Not on file  . Stress: Not on file  Relationships  . Social Herbalist on phone: Not on file    Gets together: Not on file    Attends religious service: Not on file    Active member of club or organization: Not on file    Attends meetings of clubs or organizations: Not on file    Relationship status: Not on file  . Intimate partner violence    Fear of current or ex partner: No    Emotionally abused: No    Physically abused: No    Forced sexual activity: No  Other Topics Concern  . Not on file  Social History Narrative   Lives with boyfriend     OBSERVATIONS/OBJECTIVE:  Doing well.  No apparent distress.  Voice is strained and can hear patient breathing due to trachelectomy.  Mood and behavior are normal  LABORATORY DATA:  None for this visit.  DIAGNOSTIC IMAGING:  None for this visit.      ASSESSMENT AND PLAN:  Deborah [name] is a pleasant 63 y.o. 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  She  presents to the Survivorship Clinic for our initial meeting and routine follow-up post-completion of treatment for breast cancer.    1. Stage 0 right breast cancer:  Deborah [name] is continuing to recover from definitive treatment for breast cancer. She will follow-up with her medical oncologist, Dr. Lindi Adie in 06/2018 with history and physical exam per surveillance protocol.  She will continue her anti-estrogen therapy with Anastrozole. Thus far, she is tolerating the Anastrozole well, with minimal side effects. She was instructed to make Dr. Lindi Adie or myself aware if she begins to experience any worsening side effects of the medication and I could see her back in clinic to help manage those side effects, as needed. Her mammogram is due 05/2019; orders placed today. Today, a comprehensive survivorship care plan and treatment summary was reviewed with the patient today detailing her breast cancer diagnosis, treatment course, potential late/long-term effects of treatment, appropriate follow-up care with recommendations for the future, and patient education resources.  A copy of this summary, along with a letter will be sent to the patient's primary care provider via mail/fax/In Basket message after today's visit.    2. Bone health:  Given Deborah [name] age/history of breast cancer and her current treatment regimen including anti-estrogen therapy with Anastrozole, she is at risk for bone demineralization.  She has not yet undergone bone density testing, so I ordered this for her to be done in 05/2019 when she has her mammogram.  In the meantime, she was encouraged to increase her consumption of foods rich in calcium, as well as increase her weight-bearing activities.  She was given education on specific activities to promote bone health.  3. Cancer screening:  Due to Deborah [name]'s history and her age, she should receive screening for skin cancers, colon  cancer, and gynecologic cancers.  The information  and recommendations are listed on the patient's comprehensive care plan/treatment summary and were reviewed in detail with the patient.    4. Health maintenance and wellness promotion: Deborah [name] was encouraged to consume 5-7 servings of fruits and vegetables per day. We reviewed the "Nutrition Rainbow" handout, as well as the handout "Take Control of Your Health and Reduce Your Cancer Risk" from the [place].  She was also encouraged to engage in moderate to vigorous exercise for 30 minutes per day most days of the week. We discussed the [employer] fitness program, which is designed for cancer survivors to help them become more physically fit after cancer treatments.  She was instructed to limit her alcohol consumption and continue to abstain from tobacco use.     5. Support services/counseling: It is not uncommon for this period of the patient's cancer care trajectory to be one of many emotions and stressors.  We discussed how this can be increasingly difficult during the times of quarantine and social distancing due to the COVID-19 pandemic.   She was given information regarding our available services and encouraged to contact me with any questions or for help enrolling in any of our support group/programs.    Follow up instructions:    -Return to cancer center in 06/2018 for f/u with Dr. Lindi Adie  -Mammogram due in 05/2019 -Bone density due in 05/2019 -Follow up with Dr. Donne Hazel in 12/2019 -She is welcome to return back to the Survivorship Clinic at any time; no additional follow-up needed at this time.  -Consider referral back to survivorship as a long-term survivor for continued surveillance  The patient was provided an opportunity to ask questions and all were answered. The patient agreed with the plan and demonstrated an understanding of the instructions.   The patient was advised to call back or seek an in-person evaluation if the symptoms worsen or if the condition  fails to improve as anticipated.   I provided 15 minutes of face-to-face video visit time during this encounter, and > 50% was spent counseling as documented under my assessment & plan.  Scot Dock, NP

## 2019-01-12 ENCOUNTER — Telehealth: Payer: Self-pay | Admitting: Adult Health

## 2019-01-12 NOTE — Telephone Encounter (Signed)
I talk with patient regarding schedule  

## 2019-01-17 ENCOUNTER — Other Ambulatory Visit: Payer: Self-pay

## 2019-01-17 ENCOUNTER — Encounter (HOSPITAL_COMMUNITY): Payer: Self-pay | Admitting: Dentistry

## 2019-01-17 ENCOUNTER — Ambulatory Visit (HOSPITAL_COMMUNITY): Payer: Medicaid Other | Admitting: Dentistry

## 2019-01-17 ENCOUNTER — Other Ambulatory Visit: Payer: Self-pay | Admitting: Physician Assistant

## 2019-01-17 VITALS — BP 181/103 | HR 96 | Temp 98.3°F

## 2019-01-17 DIAGNOSIS — E039 Hypothyroidism, unspecified: Secondary | ICD-10-CM

## 2019-01-17 DIAGNOSIS — K0820 Unspecified atrophy of edentulous alveolar ridge: Secondary | ICD-10-CM

## 2019-01-17 DIAGNOSIS — K08109 Complete loss of teeth, unspecified cause, unspecified class: Secondary | ICD-10-CM

## 2019-01-17 DIAGNOSIS — Z923 Personal history of irradiation: Secondary | ICD-10-CM

## 2019-01-17 DIAGNOSIS — Z463 Encounter for fitting and adjustment of dental prosthetic device: Secondary | ICD-10-CM

## 2019-01-17 DIAGNOSIS — C321 Malignant neoplasm of supraglottis: Secondary | ICD-10-CM

## 2019-01-17 DIAGNOSIS — Z972 Presence of dental prosthetic device (complete) (partial): Secondary | ICD-10-CM

## 2019-01-17 NOTE — Patient Instructions (Signed)
COVID-19 Education: The signs and symptoms of COVID-19 were discussed with the patient and how to seek care for testing (follow up with PCP or arrange E-visit).   The importance of social distancing was discussed today.  Plan:  1. Patient to followup with Dental Medicine as scheduled for yearly recall .  2. The patient to keep dentures out if sore spots arise. Use salt water rinses as needed aid healing. Call if problems arise before next scheduled appointment.   Lenn Cal, DDS

## 2019-01-17 NOTE — Progress Notes (Signed)
01/17/2019  Patient:            Deborah [name] Date of Birth:  08-28-1955 MRN:                [id number]  BP (!) 181/103 (BP Location: Left Arm)   Pulse 96   Temp 98.3 F (36.8 C)   COVID 19 SCREENING: The patient does not symptoms concerning for COVID-19 infection (Including fever, chills, cough, or new SHORTNESS OF BREATH).   Deborah [name] is a 63 year old female that presents for periodic oral exam and evaluation of upper and lower complete dentures. Patient has had multiple broken appointments for denture evaluation since seen in February of 2018.  Patient last seen on 01/19/2018 for denture recall.    Medical Hx Update:  Past Medical History:  Diagnosis Date  . Anemia   . Arthritis    in knees  . Asthma   . Breast cancer ([place])   . Bronchitis   . COPD (chronic obstructive pulmonary disease) ([place])   . Diverticulosis 11/15/2012    noted on screening colonoscopy   . Dyspnea    with COPD exerbation  . Esophageal stricture   . Former smoker 03/19/2011  . GERD (gastroesophageal reflux disease)   . Heart murmur    asymptomatic   . History of laryngectomy   . History of radiation therapy 11/15/18- 12/06/18   Right Breast total dose 42.56 Gy in 16 fractions.   [name] Hx of radiation therapy 09/03/10 to 10/16/2010   supraglottic larynx  . Hypertension   . 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 Internal hemorrhoid 11/15/2012    small, noted on screening colonoscopy   . 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  . Leukocytopenia   . Nausea alone 07/28/2013  . Neck pain 01/21/2012  . Normal MRI 07/14/11   negative for mestasis   . Pneumonia 2012  . Sciatica   . Seizures ([place])    07/24/11 off Effexor w/o seizure  . Sepsis ([place]) 08/04/12  . Sinusitis, chronic 07/20/2011   Bilateral maxillary, identified on MRI of head 07/14/11.    . 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[place])   . ALLERGIES/ADVERSE DRUG REACTIONS: Allergies  Allergen Reactions  .  Lisinopril Cough   MEDICATIONS: Current Outpatient Medications  Medication Sig Dispense Refill  . albuterol (PROVENTIL) (2.5 MG/3ML) 0.083% nebulizer solution Take 3 mLs (2.5 mg total) by nebulization every 6 (six) hours as needed for wheezing or shortness of breath. 150 mL 1  . amLODipine (NORVASC) 10 MG tablet TAKE 1 TABLET BY MOUTH ONCE DAILY (Patient taking differently: Take 10 mg by mouth daily. ) 90 tablet 3  . anastrozole (ARIMIDEX) 1 MG tablet Take 1 tablet (1 mg total) by mouth daily. 90 tablet 3  . buPROPion (WELLBUTRIN XL) 150 MG 24 hr tablet Take 1 tablet (150 mg total) by mouth daily. 90 tablet 3  . chlorpheniramine (CHLOR-TRIMETON) 4 MG tablet Take 4 mg by mouth daily.    [name] doxycycline (VIBRA-TABS) 100 MG tablet Take 100 mg by mouth 2 (two) times daily.    Arna Medici 175 MCG tablet TAKE 1 TABLET BY MOUTH  DAILY BEFORE BREAKFAST 30 tablet 0  . furosemide (LASIX) 20 MG tablet TAKE 1 TABLET BY MOUTH ONCE DAILY AS NEEDED FOR  SWELLING  OR  SHORTNESS  OF  BREATH. 30 tablet 0  . loratadine (CLARITIN) 10 MG tablet Take 1 tablet (10 mg total) by mouth daily.  30 tablet 11  . losartan (COZAAR) 100 MG tablet Take 1 tablet (100 mg total) by mouth daily. 30 tablet 3  . meloxicam (MOBIC) 15 MG tablet TAKE 1 TABLET BY MOUTH ONCE DAILY AS NEEDED 30 tablet 0  . metoprolol tartrate (LOPRESSOR) 50 MG tablet Take 1 tablet by mouth twice daily 60 tablet 0  . mometasone-formoterol (DULERA) 200-5 MCG/ACT AERO Inhale 2 puffs into the lungs 2 (two) times daily. 13 g 3  . nystatin cream (MYCOSTATIN) Apply 1 application topically 2 (two) times daily as needed (rash). 30 g 0  . omeprazole (PRILOSEC) 40 MG capsule Take 40 mg by mouth 2 (two) times daily.     [name] PROVENTIL HFA 108 (90 Base) MCG/ACT inhaler INHALE 2 PUFFS BY MOUTH EVERY 4 HOURS AS NEEDED OR SHORTNESS OF BREATH 14 g 2  . traMADol (ULTRAM) 50 MG tablet Take 1 tablet (50 mg total) by mouth every 6 (six) hours as needed. (Patient not taking: Reported  on 01/05/2019) 10 tablet 0  . TRANSDERM-SCOP, 1.5 MG, 1 MG/3DAYS PLACE 1 PATCH ONTO THE SKIN EVERY 3 DAYS AS NEEDED (Patient not taking: PLACE 1 PATCH ONTO THE SKIN EVERY 3 DAYS AS NEEDED) 10 patch 2   No current facility-administered medications for this visit.     C/C: Patient presents for periodic oral examination and evaluation of upper and lower complete dentures.   HPI:   Patient had upper and  lower complete dentures inserted on 02/27/2011. Patient had reline procedures done on November 30, 2012. The patient was last seen On 01/29/2018 for denture recall.  The patient now presents for periodic oral examination and evaluation of the upper and lower complete dentures. Patient is not having any problems with her upper or lower complete dentures by report.  DENTAL EXAM: General: Patient is a well-developed, well-nourished female in no acute distress. Tracheostomy is present. Vitals: BP (!) 181/103 (BP Location: Left Arm)   Pulse 96   Temp 98.3 F (36.8 C)  Extraoral Exam:  There is no palpable lymphadenopathy. There are no TMJ Symptoms. The tracheal stoma is still in place.  Intraoral  Exam: Patient has xerostomia. There are no soft tissue lesions or denture irritation noted.  There is atrophy of the edentulous alveolar ridges. Patient has thin, spiny ridges of the mandibular arch. Dentition: Patient is edentulous. Prosthodontic: The patient has upper and lower complete dentures. Dentures are stable and retentive.  Pressure indicating paste was applied to the dentures and dentures were adjusted as needed. Dentures were polished.  Patient accepted the results of the denture adjustment. Occlusion:  The denture occlusion was evaluated. The patient has acceptable occlusion. The patient has a tendency to protrude to and past the End to End position. Patient indicates she is able to chew "anything she wants'.  Assessments: 1. Patient is edentulous. 2. There is atrophy of the edentulous alveolar  ridges 3. Upper and lower complete dentures are acceptable.   Plan:  1. Patient to followup with Dental Medicine as scheduled for yearly recall .  2. The patient to keep dentures out if sore spots arise. Use salt water rinses as needed aid healing. Call if problems arise before next scheduled appointment.   Lenn Cal, DDS

## 2019-01-18 ENCOUNTER — Encounter (HOSPITAL_COMMUNITY): Payer: Medicaid Other | Admitting: Dentistry

## 2019-01-18 MED ORDER — OMEPRAZOLE 40 MG PO CPDR: 40.0000 mg | DELAYED_RELEASE_CAPSULE | Freq: Two times a day (BID) | ORAL | 0 refills | Status: DC

## 2019-01-18 MED ORDER — LEVOTHYROXINE SODIUM 175 MCG PO TABS: ORAL_TABLET | ORAL | 0 refills | Status: DC

## 2019-01-28 DIAGNOSIS — Z8521 Personal history of malignant neoplasm of larynx: Secondary | ICD-10-CM | POA: Diagnosis not present

## 2019-01-28 DIAGNOSIS — K222 Esophageal obstruction: Secondary | ICD-10-CM | POA: Diagnosis not present

## 2019-01-28 DIAGNOSIS — R1314 Dysphagia, pharyngoesophageal phase: Secondary | ICD-10-CM | POA: Diagnosis not present

## 2019-01-28 DIAGNOSIS — Z923 Personal history of irradiation: Secondary | ICD-10-CM | POA: Diagnosis not present

## 2019-02-02 DIAGNOSIS — C329 Malignant neoplasm of larynx, unspecified: Secondary | ICD-10-CM | POA: Diagnosis not present

## 2019-02-02 DIAGNOSIS — Z930 Tracheostomy status: Secondary | ICD-10-CM | POA: Diagnosis not present

## 2019-02-02 DIAGNOSIS — C321 Malignant neoplasm of supraglottis: Secondary | ICD-10-CM | POA: Diagnosis not present

## 2019-02-17 ENCOUNTER — Other Ambulatory Visit: Payer: Self-pay | Admitting: Internal Medicine

## 2019-02-17 ENCOUNTER — Other Ambulatory Visit: Payer: Self-pay | Admitting: Sports Medicine

## 2019-02-17 ENCOUNTER — Other Ambulatory Visit: Payer: Self-pay

## 2019-02-17 ENCOUNTER — Other Ambulatory Visit: Payer: Self-pay | Admitting: Family Medicine

## 2019-02-17 DIAGNOSIS — E039 Hypothyroidism, unspecified: Secondary | ICD-10-CM

## 2019-02-17 DIAGNOSIS — I10 Essential (primary) hypertension: Secondary | ICD-10-CM

## 2019-02-18 MED ORDER — NYSTATIN 100000 UNIT/GM EX CREA: 1.0000 "application " | TOPICAL_CREAM | Freq: Two times a day (BID) | CUTANEOUS | 0 refills | Status: DC | PRN

## 2019-02-18 MED ORDER — AMLODIPINE BESYLATE 10 MG PO TABS: 10.0000 mg | ORAL_TABLET | Freq: Every day | ORAL | 3 refills | Status: DC

## 2019-02-22 NOTE — H&P (Signed)
Deborah Mussen Witherspoonis an 63 y.o.female.  Chief Complaint:Trouble swallowing PL:[id number] of total laryngectomy and radiation many years ago. Having some trouble swallowing again. Has had successful esophageal dilatation in the past.      Past Medical History:  Diagnosis Date  . Anemia   . Arthritis    in knees  . Asthma   . Bronchitis   . COPD (chronic obstructive pulmonary disease) ([place])   . Diverticulosis 11/15/2012    noted on screening colonoscopy   . Dyspnea    with COPD exerbation  . Esophageal stricture   . Former smoker 03/19/2011  . GERD (gastroesophageal reflux disease)   . Heart murmur    asymptomatic   . History of laryngectomy   . 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 Hypertension   . 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 Internal hemorrhoid 11/15/2012    small, noted on screening colonoscopy   . 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  . Leukocytopenia   . Nausea alone 07/28/2013  . Neck pain 01/21/2012  . Normal MRI 07/14/11   negative for mestasis   . Pneumonia 2012  . Sciatica   . Seizures ([place])    07/24/11 off Effexor w/o seizure  . Sepsis ([place]) 08/04/12  . Sinusitis, chronic 07/20/2011  . Sinusitis, chronic 07/20/2011   Bilateral maxillary, identified on MRI of head 07/14/11.   . Tracheostomy dependent [facility])          Past Surgical History:  Procedure Laterality Date  . COLONOSCOPY N/A 11/15/2012   Procedure: COLONOSCOPY; Surgeon: Lafayette Dragon, MD; Location: [facility]; Service: Endoscopy; Laterality: N/A;  . DENTAL RESTORATION/EXTRACTION WITH X-RAY    . ESOPHAGOSCOPY  06/21/2012   Procedure: ESOPHAGOSCOPY; Surgeon: [name], MD; Location: [place]; Service: ENT; Laterality: N/A;  . ESOPHAGOSCOPY WITH DILITATION N/A 09/21/2014   Procedure: ESOPHAGOSCOPY WITH DILITATION; Surgeon: [name], MD; Location:  [place]; Service: ENT; Laterality: N/A;  . ESOPHAGOSCOPY WITH DILITATION N/A 07/04/2016   Procedure: ESOPHAGOSCOPY WITH DILITATION; Surgeon: [name], MD; Location: [place]; Service: ENT; Laterality: N/A;  . FOREIGN BODY REMOVAL BRONCHIAL  10/02/2011   Procedure: REMOVAL FOREIGN BODY BRONCHIAL; Surgeon: Ruby Cola, MD; Location: [place]; Service: ENT; Laterality: N/A;  . LARYNGECTOMY    . Porta cath removal    . PORTACATH PLACEMENT  09/17/10   Tip in cavoatrial junction  . STOMAPLASTY N/A 10/21/2016   Procedure: Zola Button; Surgeon: [name], MD; Location: [place]; Service: ENT; Laterality: N/A;  . TRACHEAL DILITATION  07/16/2011   Procedure: TRACHEAL DILITATION; Surgeon: Beckie Salts, MD; Location: [place]; Service: ENT; Laterality: N/A; dilation of tracheal stoma and replacement of stoma tube  . TUBAL LIGATION  1982         Family History  Problem Relation Age of Onset  . Heart disease Mother   . Heart disease Father   . Heart disease Sister   . Breast cancer Neg Hx    Social History:reports that she quit smoking about 6 years ago. Her smoking use included cigarettes. She has a 40.00 pack-year smoking history. she has never used smokeless tobacco. She reports that she drinks alcohol. She reports that she does not use drugs.  Allergies:     Allergies  Allergen Reactions  . Lisinopril Cough    No medications prior to admission.    LabResultsLast48Hours  No results found for this or any previous visit (  from the past 48 hour(s)).   ImagingResults(Last48hours)  No results found.    ROS: otherwise negative  There were no vitals taken for this visit.  PHYSICAL EXAM: Overall appearance: Healthy appearing, in no distress Head: Normocephalic, atraumatic. Ears: External auditory canals are clear; tympanic membranes are intact and the middle ears are free of any effusion. Nose: External nose is healthy in  appearance. Internal nasal exam free of any lesions or obstruction. Oral Cavity/pharynx: There are no mucosal lesions or masses identified. Neuro: No identifiable neurologic deficits. Neck: No palpable neck masses.Stoma is clear. TEP functioning.  Studies Reviewed: none    Assessment/Plan Esophageal stricture following laryngectomy and radiation years ago. Proceed with esophagoscopy and dilatation.

## 2019-03-01 ENCOUNTER — Encounter (HOSPITAL_BASED_OUTPATIENT_CLINIC_OR_DEPARTMENT_OTHER): Payer: Self-pay | Admitting: *Deleted

## 2019-03-01 ENCOUNTER — Other Ambulatory Visit: Payer: Self-pay

## 2019-03-01 DIAGNOSIS — C321 Malignant neoplasm of supraglottis: Secondary | ICD-10-CM | POA: Diagnosis not present

## 2019-03-01 DIAGNOSIS — Z963 Presence of artificial larynx: Secondary | ICD-10-CM | POA: Diagnosis not present

## 2019-03-01 DIAGNOSIS — Z8521 Personal history of malignant neoplasm of larynx: Secondary | ICD-10-CM | POA: Diagnosis not present

## 2019-03-01 DIAGNOSIS — C329 Malignant neoplasm of larynx, unspecified: Secondary | ICD-10-CM | POA: Diagnosis not present

## 2019-03-01 DIAGNOSIS — Z79899 Other long term (current) drug therapy: Secondary | ICD-10-CM | POA: Diagnosis not present

## 2019-03-01 DIAGNOSIS — Z930 Tracheostomy status: Secondary | ICD-10-CM | POA: Diagnosis not present

## 2019-03-01 DIAGNOSIS — Z448 Encounter for fitting and adjustment of other external prosthetic devices: Secondary | ICD-10-CM | POA: Diagnosis not present

## 2019-03-01 NOTE — Progress Notes (Signed)
Chart reviewed with Dr Lissa Hoard, [place] for [facility].

## 2019-03-03 ENCOUNTER — Other Ambulatory Visit: Payer: Self-pay

## 2019-03-03 ENCOUNTER — Other Ambulatory Visit (HOSPITAL_COMMUNITY)
Admission: RE | Admit: 2019-03-03 | Discharge: 2019-03-03 | Disposition: A | Discharge status: Home / Self Care | Payer: Medicaid Other | Source: Ambulatory Visit | Attending: Otolaryngology | Admitting: Otolaryngology

## 2019-03-03 ENCOUNTER — Encounter (HOSPITAL_BASED_OUTPATIENT_CLINIC_OR_DEPARTMENT_OTHER)
Admission: RE | Admit: 2019-03-03 | Discharge: 2019-03-03 | Disposition: A | Discharge status: Home / Self Care | Payer: Medicaid Other | Source: Ambulatory Visit | Attending: Otolaryngology | Admitting: Otolaryngology

## 2019-03-03 DIAGNOSIS — Z01812 Encounter for preprocedural laboratory examination: Secondary | ICD-10-CM | POA: Diagnosis not present

## 2019-03-03 DIAGNOSIS — Z20828 Contact with and (suspected) exposure to other viral communicable diseases: Secondary | ICD-10-CM | POA: Diagnosis not present

## 2019-03-03 LAB — BASIC METABOLIC PANEL
Anion gap: 8 (ref 5–15)
BUN: 20 mg/dL (ref 8–23)
CO2: 21 mmol/L — ABNORMAL LOW (ref 22–32)
Calcium: 9.3 mg/dL (ref 8.9–10.3)
Chloride: 112 mmol/L — ABNORMAL HIGH (ref 98–111)
Creatinine, Ser: 0.9 mg/dL (ref 0.44–1.00)
GFR calc Af Amer: >60 mL/min (ref >60)
GFR calc non Af Amer: >60 mL/min (ref >60)
Glucose, Bld: 109 mg/dL — ABNORMAL HIGH (ref 70–99)
Potassium: 4.1 mmol/L (ref 3.5–5.1)
Sodium: 141 mmol/L (ref 135–145)

## 2019-03-03 NOTE — Progress Notes (Signed)
EKG reviewed with Dr Smith Robert. Ok to proceed with surgery as scheduled

## 2019-03-04 LAB — NOVEL CORONAVIRUS, NAA (HOSP ORDER, SEND-OUT TO REF LAB; TAT 18-24 HRS): SARS-CoV-2, NAA: NOT DETECTED

## 2019-03-07 ENCOUNTER — Ambulatory Visit (HOSPITAL_BASED_OUTPATIENT_CLINIC_OR_DEPARTMENT_OTHER)
Admission: RE | Admit: 2019-03-07 | Discharge: 2019-03-07 | Disposition: A | Discharge status: Home / Self Care | Payer: Medicaid Other | Attending: Otolaryngology | Admitting: Otolaryngology

## 2019-03-07 ENCOUNTER — Other Ambulatory Visit: Payer: Self-pay

## 2019-03-07 ENCOUNTER — Encounter (HOSPITAL_BASED_OUTPATIENT_CLINIC_OR_DEPARTMENT_OTHER)
Admission: RE | Disposition: A | Discharge status: Home / Self Care | Payer: Self-pay | Source: Home / Self Care | Attending: Otolaryngology

## 2019-03-07 ENCOUNTER — Ambulatory Visit (HOSPITAL_BASED_OUTPATIENT_CLINIC_OR_DEPARTMENT_OTHER): Payer: Medicaid Other | Admitting: Anesthesiology

## 2019-03-07 ENCOUNTER — Encounter (HOSPITAL_BASED_OUTPATIENT_CLINIC_OR_DEPARTMENT_OTHER): Payer: Self-pay | Admitting: *Deleted

## 2019-03-07 DIAGNOSIS — R1314 Dysphagia, pharyngoesophageal phase: Secondary | ICD-10-CM | POA: Diagnosis not present

## 2019-03-07 DIAGNOSIS — K222 Esophageal obstruction: Secondary | ICD-10-CM | POA: Insufficient documentation

## 2019-03-07 DIAGNOSIS — Z87891 Personal history of nicotine dependence: Secondary | ICD-10-CM | POA: Diagnosis not present

## 2019-03-07 DIAGNOSIS — Z923 Personal history of irradiation: Secondary | ICD-10-CM | POA: Diagnosis not present

## 2019-03-07 DIAGNOSIS — Z888 Allergy status to other drugs, medicaments and biological substances status: Secondary | ICD-10-CM | POA: Diagnosis not present

## 2019-03-07 DIAGNOSIS — Z79899 Other long term (current) drug therapy: Secondary | ICD-10-CM | POA: Diagnosis not present

## 2019-03-07 DIAGNOSIS — Z930 Tracheostomy status: Secondary | ICD-10-CM | POA: Diagnosis not present

## 2019-03-07 DIAGNOSIS — I10 Essential (primary) hypertension: Secondary | ICD-10-CM | POA: Diagnosis not present

## 2019-03-07 DIAGNOSIS — F329 Major depressive disorder, single episode, unspecified: Secondary | ICD-10-CM | POA: Diagnosis not present

## 2019-03-07 DIAGNOSIS — R569 Unspecified convulsions: Secondary | ICD-10-CM | POA: Diagnosis not present

## 2019-03-07 DIAGNOSIS — Z853 Personal history of malignant neoplasm of breast: Secondary | ICD-10-CM | POA: Diagnosis not present

## 2019-03-07 DIAGNOSIS — E039 Hypothyroidism, unspecified: Secondary | ICD-10-CM | POA: Insufficient documentation

## 2019-03-07 DIAGNOSIS — M170 Bilateral primary osteoarthritis of knee: Secondary | ICD-10-CM | POA: Diagnosis not present

## 2019-03-07 DIAGNOSIS — K219 Gastro-esophageal reflux disease without esophagitis: Secondary | ICD-10-CM | POA: Diagnosis not present

## 2019-03-07 DIAGNOSIS — Z8521 Personal history of malignant neoplasm of larynx: Secondary | ICD-10-CM | POA: Diagnosis not present

## 2019-03-07 DIAGNOSIS — J449 Chronic obstructive pulmonary disease, unspecified: Secondary | ICD-10-CM | POA: Insufficient documentation

## 2019-03-07 HISTORY — PX: ESOPHAGOSCOPY WITH DILITATION: SHX5618

## 2019-03-07 SURGERY — ESOPHAGOSCOPY, WITH DILATION
Anesthesia: General | Site: Throat

## 2019-03-07 MED ORDER — MIDAZOLAM HCL 2 MG/2ML IJ SOLN
INTRAMUSCULAR | Status: AC
  Filled 2019-03-07: qty 2

## 2019-03-07 MED ORDER — EPINEPHRINE PF 1 MG/ML IJ SOLN
INTRAMUSCULAR | Status: AC
  Filled 2019-03-07: qty 1

## 2019-03-07 MED ORDER — FENTANYL CITRATE (PF) 100 MCG/2ML IJ SOLN: 25.0000 ug | INTRAMUSCULAR | Status: DC | PRN

## 2019-03-07 MED ORDER — DEXAMETHASONE SODIUM PHOSPHATE 4 MG/ML IJ SOLN
INTRAMUSCULAR | Status: DC | PRN
  Administered 2019-03-07: 5 mg via INTRAVENOUS

## 2019-03-07 MED ORDER — PROPOFOL 10 MG/ML IV BOLUS
INTRAVENOUS | Status: AC
  Filled 2019-03-07: qty 20

## 2019-03-07 MED ORDER — OXYCODONE HCL 5 MG PO TABS: 5.0000 mg | ORAL_TABLET | Freq: Once | ORAL | Status: DC | PRN

## 2019-03-07 MED ORDER — OXYCODONE HCL 5 MG/5ML PO SOLN: 5.0000 mg | Freq: Once | ORAL | Status: DC | PRN

## 2019-03-07 MED ORDER — LIDOCAINE 2% (20 MG/ML) 5 ML SYRINGE
INTRAMUSCULAR | Status: DC | PRN
  Administered 2019-03-07: 60 mg via INTRAVENOUS

## 2019-03-07 MED ORDER — ONDANSETRON HCL 4 MG/2ML IJ SOLN
INTRAMUSCULAR | Status: DC | PRN
  Administered 2019-03-07: 4 mg via INTRAVENOUS

## 2019-03-07 MED ORDER — LIDOCAINE 2% (20 MG/ML) 5 ML SYRINGE
INTRAMUSCULAR | Status: AC
  Filled 2019-03-07: qty 5

## 2019-03-07 MED ORDER — FENTANYL CITRATE (PF) 100 MCG/2ML IJ SOLN
50.0000 ug | INTRAMUSCULAR | Status: DC | PRN
  Administered 2019-03-07: 50 ug via INTRAVENOUS

## 2019-03-07 MED ORDER — SCOPOLAMINE 1 MG/3DAYS TD PT72: 1.0000 | MEDICATED_PATCH | Freq: Once | TRANSDERMAL | Status: DC

## 2019-03-07 MED ORDER — DEXAMETHASONE SODIUM PHOSPHATE 10 MG/ML IJ SOLN
INTRAMUSCULAR | Status: AC
  Filled 2019-03-07: qty 1

## 2019-03-07 MED ORDER — FENTANYL CITRATE (PF) 100 MCG/2ML IJ SOLN
INTRAMUSCULAR | Status: AC
  Filled 2019-03-07: qty 2

## 2019-03-07 MED ORDER — PROPOFOL 10 MG/ML IV BOLUS
INTRAVENOUS | Status: DC | PRN
  Administered 2019-03-07: 150 mg via INTRAVENOUS
  Administered 2019-03-07: 50 mg via INTRAVENOUS

## 2019-03-07 MED ORDER — ONDANSETRON HCL 4 MG/2ML IJ SOLN
INTRAMUSCULAR | Status: AC
  Filled 2019-03-07: qty 2

## 2019-03-07 MED ORDER — ONDANSETRON HCL 4 MG/2ML IJ SOLN: 4.0000 mg | Freq: Once | INTRAMUSCULAR | Status: DC | PRN

## 2019-03-07 MED ORDER — SUCCINYLCHOLINE CHLORIDE 200 MG/10ML IV SOSY
PREFILLED_SYRINGE | INTRAVENOUS | Status: AC
  Filled 2019-03-07: qty 10

## 2019-03-07 MED ORDER — LACTATED RINGERS IV SOLN
INTRAVENOUS | Status: DC
  Administered 2019-03-07 (×2): via INTRAVENOUS

## 2019-03-07 MED ORDER — MIDAZOLAM HCL 2 MG/2ML IJ SOLN
1.0000 mg | INTRAMUSCULAR | Status: DC | PRN
  Administered 2019-03-07: 2 mg via INTRAVENOUS

## 2019-03-07 SURGICAL SUPPLY — 27 items
CANISTER SUCT 1200ML W/VALVE (MISCELLANEOUS) ×2 IMPLANT
CONT SPEC 4OZ CLIKSEAL STRL BL (MISCELLANEOUS) IMPLANT
COVER WAND RF STERILE (DRAPES) IMPLANT
DRAPE HALF SHEET 70X43 (DRAPES) ×2 IMPLANT
GAUZE SPONGE 4X4 12PLY STRL LF (GAUZE/BANDAGES/DRESSINGS) ×3 IMPLANT
GLOVE BIO SURGEON STRL SZ 6.5 (GLOVE) ×1 IMPLANT
GLOVE BIOGEL PI IND STRL 7.0 (GLOVE) IMPLANT
GLOVE BIOGEL PI INDICATOR 7.0 (GLOVE) ×1
GLOVE ECLIPSE 7.5 STRL STRAW (GLOVE) ×2 IMPLANT
GOWN STRL REUS W/ TWL LRG LVL3 (GOWN DISPOSABLE) IMPLANT
GOWN STRL REUS W/ TWL XL LVL3 (GOWN DISPOSABLE) IMPLANT
GOWN STRL REUS W/TWL LRG LVL3 (GOWN DISPOSABLE) ×1
GOWN STRL REUS W/TWL XL LVL3 (GOWN DISPOSABLE) ×1
GUARD TEETH (MISCELLANEOUS) ×1 IMPLANT
NDL HYPO 18GX1.5 BLUNT FILL (NEEDLE) ×1 IMPLANT
NDL SPNL 22GX7 QUINCKE BK (NEEDLE) IMPLANT
NEEDLE HYPO 18GX1.5 BLUNT FILL (NEEDLE) IMPLANT
NEEDLE SPNL 22GX7 QUINCKE BK (NEEDLE) IMPLANT
NS IRRIG 1000ML POUR BTL (IV SOLUTION) ×1 IMPLANT
PACK BASIN DAY SURGERY FS (CUSTOM PROCEDURE TRAY) ×2 IMPLANT
PATTIES SURGICAL .5 X3 (DISPOSABLE) ×1 IMPLANT
SLEEVE SCD COMPRESS KNEE MED (MISCELLANEOUS) ×1 IMPLANT
SURGILUBE 2OZ TUBE FLIPTOP (MISCELLANEOUS) ×3 IMPLANT
SYR 5ML LL (SYRINGE) ×1 IMPLANT
SYR CONTROL 10ML LL (SYRINGE) IMPLANT
TOWEL GREEN STERILE FF (TOWEL DISPOSABLE) ×2 IMPLANT
TUBE CONNECTING 20X1/4 (TUBING) ×2 IMPLANT

## 2019-03-07 NOTE — Transfer of Care (Signed)
Immediate Anesthesia Transfer of Care Note  Patient: Deborah [name]  Procedure(s) Performed: Esophagoscopy with dilatation (N/A Throat)  Patient Location: PACU  Anesthesia Type:General  Level of Consciousness: awake, alert  and oriented  Airway & Oxygen Therapy: Patient Spontanous Breathing and Patient connected to nasal cannula oxygen  Post-op Assessment: Report given to RN and Post -op Vital signs reviewed and stable  Post vital signs: Reviewed and stable  Last Vitals:  Vitals Value Taken Time  BP 160/86 03/07/19 0815  Temp    Pulse 74 03/07/19 0818  Resp 19 03/07/19 0818  SpO2 99 % 03/07/19 0818  Vitals shown include unvalidated device data.  Last Pain:  Vitals:   03/07/19 0701  TempSrc: Oral  PainSc: 0-No pain         Complications: No apparent anesthesia complications

## 2019-03-07 NOTE — Anesthesia Postprocedure Evaluation (Signed)
Anesthesia Post Note  Patient: Deborah [name]  Procedure(s) Performed: Esophagoscopy with dilatation (N/A Throat)     Patient location during evaluation: PACU Anesthesia Type: General Level of consciousness: awake and alert Pain management: pain level controlled Vital Signs Assessment: post-procedure vital signs reviewed and stable Respiratory status: spontaneous breathing, nonlabored ventilation and respiratory function stable Cardiovascular status: blood pressure returned to baseline and stable Postop Assessment: no apparent nausea or vomiting Anesthetic complications: no    Last Vitals:  Vitals:   03/07/19 0846 03/07/19 0900  BP: (!) 159/90 (!) 155/82  Pulse: 69 76  Resp: 15 20  Temp:  36.8 C  SpO2: 98% 96%    Last Pain:  Vitals:   03/07/19 0900  TempSrc:   PainSc: 0-No pain                 Lidia Collum

## 2019-03-07 NOTE — Interval H&P Note (Signed)
History and Physical Interval Note:  03/07/2019 7:21 AM  Deborah [name]  has presented today for surgery, with the diagnosis of Z85.21 History of laryngeal cancer, K22.2 Esophageal stricture, R13.14 Pharyngoesphageal dysphagia, Z92.3 History of radiation therapy.  The various methods of treatment have been discussed with the patient and family. After consideration of risks, benefits and other options for treatment, the patient has consented to  Procedure(s): Esophagoscopy with dilatation (N/A) as a surgical intervention.  The patient's history has been reviewed, patient examined, no change in status, stable for surgery.  I have reviewed the patient's chart and labs.  Questions were answered to the patient's satisfaction.     [name]

## 2019-03-07 NOTE — Anesthesia Procedure Notes (Signed)
Procedure Name: Intubation Date/Time: 03/07/2019 7:36 AM Performed by: Bufford Spikes, CRNA Pre-anesthesia Checklist: Patient identified, Emergency Drugs available, Suction available, Patient being monitored and Timeout performed Patient Re-evaluated:Patient Re-evaluated prior to induction Oxygen Delivery Method: Circle system utilized Preoxygenation: Pre-oxygenation with 100% oxygen Induction Type: Tracheostomy Ventilation: Unable to mask ventilate Tube type: Reinforced Tube size: 6.0 mm Number of attempts: 1 Airway Equipment and Method: Tracheostomy Placement Confirmation: positive ETCO2 and breath sounds checked- equal and bilateral Secured at: 12 cm Tube secured with: Tape Dental Injury: Teeth and Oropharynx as per pre-operative assessment

## 2019-03-07 NOTE — Op Note (Signed)
OPERATIVE REPORT  DATE OF SURGERY: 03/07/2019  PATIENT:  Deborah [name],  63 y.o. female  PRE-OPERATIVE DIAGNOSIS:  Z85.21 History of laryngeal cancer, K22.2 Esophageal stricture, R13.14 Pharyngoesphageal dysphagia, Z92.3 History of radiation therapy  POST-OPERATIVE DIAGNOSIS:  Z85.21 History of laryngeal cancer, K22.2 Esophageal stricture, R13.14 Pharyngoesphageal dysphagia, Z92.3 History of radiation therapy  PROCEDURE:  Procedure(s): Esophagoscopy with dilatation  SURGEON:  Beckie Salts, MD  ASSISTANTS: none  ANESTHESIA:   General   EBL:  0 ml  DRAINS: none  LOCAL MEDICATIONS USED:  None  SPECIMEN: None  COUNTS:  Correct  PROCEDURE DETAILS: The patient was taken to the operating room and placed on the operating table in the supine position. Following induction of general endotracheal anesthesia, the table was turned 90 and the patient was draped in a standard fashion.  The patient is edentulous.  A cervical esophagoscope was passed into the oral cavity into the pharynx and used to visualize the nasopharynx.  The stricture was identified.  Savory dilators were passed starting with 21 [place] and advancing up to 39 [place].  I was unable to pass the 36 [place].  I did not attempt to force it.  A Jako laryngoscope was used for the larger dilators.  There was no bleeding identified.  The scope was removed and the patient was awakened extubated and transferred to recovery in stable condition.    PATIENT DISPOSITION:  To PACU, stable

## 2019-03-07 NOTE — Discharge Instructions (Signed)
Advance diet as tolerated.   Post Anesthesia Home Care Instructions  Activity: Get plenty of rest for the remainder of the day. A responsible individual must stay with you for 24 hours following the procedure.  For the next 24 hours, DO NOT: -Drive a car -Paediatric nurse -Drink alcoholic beverages -Take any medication unless instructed by your physician -Make any legal decisions or sign important papers.  Meals: Start with liquid foods such as gelatin or soup. Progress to regular foods as tolerated. Avoid greasy, spicy, heavy foods. If nausea and/or vomiting occur, drink only clear liquids until the nausea and/or vomiting subsides. Call your physician if vomiting continues.  Special Instructions/Symptoms: Your throat may feel dry or sore from the anesthesia or the breathing tube placed in your throat during surgery. If this causes discomfort, gargle with warm salt water. The discomfort should disappear within 24 hours.  If you had a scopolamine patch placed behind your ear for the management of post- operative nausea and/or vomiting:  1. The medication in the patch is effective for 72 hours, after which it should be removed.  Wrap patch in a tissue and discard in the trash. Wash hands thoroughly with soap and water. 2. You may remove the patch earlier than 72 hours if you experience unpleasant side effects which may include dry mouth, dizziness or visual disturbances. 3. Avoid touching the patch. Wash your hands with soap and water after contact with the patch.

## 2019-03-07 NOTE — Anesthesia Preprocedure Evaluation (Addendum)
Anesthesia Evaluation  Patient identified by MRN, date of birth, ID band Patient awake    Reviewed: Allergy & Precautions, NPO status , Patient's Chart, lab work & pertinent test results, reviewed documented beta blocker date and time   History of Anesthesia Complications Negative for: history of anesthetic complications  Airway       Comment: Laryngectomy Dental   Pulmonary asthma , COPD, former smoker,    Pulmonary exam normal        Cardiovascular hypertension, Pt. on medications and Pt. on home beta blockers Normal cardiovascular exam     Neuro/Psych Seizures -,  PSYCHIATRIC DISORDERS Depression    GI/Hepatic Neg liver ROS, GERD  Medicated,  Endo/Other  Hypothyroidism   Renal/GU negative Renal ROS  negative genitourinary   Musculoskeletal  (+) Arthritis ,   Abdominal   Peds  Hematology negative hematology ROS (+)   Anesthesia Other Findings S/p total laryngectomy/XRT with esophageal stricture  Reproductive/Obstetrics                          Anesthesia Physical Anesthesia Plan  ASA: III  Anesthesia Plan: General   Post-op Pain Management:    Induction: Intravenous  PONV Risk Score and Plan: 3 and Ondansetron, Dexamethasone, Treatment may vary due to age or medical condition and Midazolam  Airway Management Planned: Tracheostomy  Additional Equipment: None  Intra-op Plan:   Post-operative Plan: Extubation in OR  Informed Consent: I have reviewed the patients History and Physical, chart, labs and discussed the procedure including the risks, benefits and alternatives for the proposed anesthesia with the patient or authorized representative who has indicated his/her understanding and acceptance.     Dental advisory given  Plan Discussed with:   Anesthesia Plan Comments: (GA with ETT via laryngectomy stoma)       Anesthesia Quick Evaluation

## 2019-03-08 ENCOUNTER — Encounter (HOSPITAL_BASED_OUTPATIENT_CLINIC_OR_DEPARTMENT_OTHER): Payer: Self-pay | Admitting: Otolaryngology

## 2019-03-18 DIAGNOSIS — Z9002 Acquired absence of larynx: Secondary | ICD-10-CM | POA: Diagnosis not present

## 2019-03-19 ENCOUNTER — Encounter (HOSPITAL_COMMUNITY): Payer: Self-pay

## 2019-03-19 ENCOUNTER — Other Ambulatory Visit: Payer: Self-pay

## 2019-03-19 ENCOUNTER — Inpatient Hospital Stay (HOSPITAL_COMMUNITY): Payer: Medicaid Other | Admitting: Anesthesiology

## 2019-03-19 ENCOUNTER — Emergency Department (HOSPITAL_COMMUNITY): Payer: Medicaid Other

## 2019-03-19 ENCOUNTER — Inpatient Hospital Stay (HOSPITAL_COMMUNITY)
Admission: EM | Admit: 2019-03-19 | Discharge: 2019-03-19 | DRG: 164 | Disposition: A | Discharge status: Home / Self Care | Payer: Medicaid Other | Attending: Student | Admitting: Student

## 2019-03-19 ENCOUNTER — Encounter (HOSPITAL_COMMUNITY)
Admission: EM | Disposition: A | Discharge status: Home / Self Care | Payer: Self-pay | Source: Home / Self Care | Attending: Student

## 2019-03-19 DIAGNOSIS — T17590A Other foreign object in bronchus causing asphyxiation, initial encounter: Principal | ICD-10-CM | POA: Diagnosis present

## 2019-03-19 DIAGNOSIS — Z8249 Family history of ischemic heart disease and other diseases of the circulatory system: Secondary | ICD-10-CM

## 2019-03-19 DIAGNOSIS — Z20828 Contact with and (suspected) exposure to other viral communicable diseases: Secondary | ICD-10-CM | POA: Diagnosis present

## 2019-03-19 DIAGNOSIS — Z930 Tracheostomy status: Secondary | ICD-10-CM | POA: Diagnosis not present

## 2019-03-19 DIAGNOSIS — F329 Major depressive disorder, single episode, unspecified: Secondary | ICD-10-CM | POA: Diagnosis present

## 2019-03-19 DIAGNOSIS — D0511 Intraductal carcinoma in situ of right breast: Secondary | ICD-10-CM | POA: Diagnosis not present

## 2019-03-19 DIAGNOSIS — Z9221 Personal history of antineoplastic chemotherapy: Secondary | ICD-10-CM

## 2019-03-19 DIAGNOSIS — K219 Gastro-esophageal reflux disease without esophagitis: Secondary | ICD-10-CM | POA: Diagnosis not present

## 2019-03-19 DIAGNOSIS — M1990 Unspecified osteoarthritis, unspecified site: Secondary | ICD-10-CM | POA: Diagnosis not present

## 2019-03-19 DIAGNOSIS — T17900A Unspecified foreign body in respiratory tract, part unspecified causing asphyxiation, initial encounter: Secondary | ICD-10-CM

## 2019-03-19 DIAGNOSIS — J984 Other disorders of lung: Secondary | ICD-10-CM | POA: Diagnosis not present

## 2019-03-19 DIAGNOSIS — Z888 Allergy status to other drugs, medicaments and biological substances status: Secondary | ICD-10-CM | POA: Diagnosis not present

## 2019-03-19 DIAGNOSIS — I110 Hypertensive heart disease with heart failure: Secondary | ICD-10-CM | POA: Diagnosis not present

## 2019-03-19 DIAGNOSIS — Z833 Family history of diabetes mellitus: Secondary | ICD-10-CM

## 2019-03-19 DIAGNOSIS — T17908A Unspecified foreign body in respiratory tract, part unspecified causing other injury, initial encounter: Secondary | ICD-10-CM | POA: Diagnosis present

## 2019-03-19 DIAGNOSIS — Z853 Personal history of malignant neoplasm of breast: Secondary | ICD-10-CM

## 2019-03-19 DIAGNOSIS — Z8521 Personal history of malignant neoplasm of larynx: Secondary | ICD-10-CM

## 2019-03-19 DIAGNOSIS — Z79890 Hormone replacement therapy (postmenopausal): Secondary | ICD-10-CM

## 2019-03-19 DIAGNOSIS — K5790 Diverticulosis of intestine, part unspecified, without perforation or abscess without bleeding: Secondary | ICD-10-CM | POA: Diagnosis not present

## 2019-03-19 DIAGNOSIS — Z90710 Acquired absence of both cervix and uterus: Secondary | ICD-10-CM | POA: Diagnosis not present

## 2019-03-19 DIAGNOSIS — T17598A Other foreign object in bronchus causing other injury, initial encounter: Secondary | ICD-10-CM | POA: Diagnosis not present

## 2019-03-19 DIAGNOSIS — Z809 Family history of malignant neoplasm, unspecified: Secondary | ICD-10-CM | POA: Diagnosis not present

## 2019-03-19 DIAGNOSIS — N179 Acute kidney failure, unspecified: Secondary | ICD-10-CM | POA: Diagnosis not present

## 2019-03-19 DIAGNOSIS — Z87891 Personal history of nicotine dependence: Secondary | ICD-10-CM | POA: Diagnosis not present

## 2019-03-19 DIAGNOSIS — I5032 Chronic diastolic (congestive) heart failure: Secondary | ICD-10-CM | POA: Diagnosis present

## 2019-03-19 DIAGNOSIS — I10 Essential (primary) hypertension: Secondary | ICD-10-CM | POA: Diagnosis not present

## 2019-03-19 DIAGNOSIS — Z923 Personal history of irradiation: Secondary | ICD-10-CM | POA: Diagnosis not present

## 2019-03-19 DIAGNOSIS — C329 Malignant neoplasm of larynx, unspecified: Secondary | ICD-10-CM | POA: Diagnosis present

## 2019-03-19 DIAGNOSIS — F325 Major depressive disorder, single episode, in full remission: Secondary | ICD-10-CM | POA: Diagnosis not present

## 2019-03-19 DIAGNOSIS — Z7951 Long term (current) use of inhaled steroids: Secondary | ICD-10-CM | POA: Diagnosis not present

## 2019-03-19 DIAGNOSIS — E1159 Type 2 diabetes mellitus with other circulatory complications: Secondary | ICD-10-CM | POA: Diagnosis present

## 2019-03-19 DIAGNOSIS — J449 Chronic obstructive pulmonary disease, unspecified: Secondary | ICD-10-CM | POA: Diagnosis present

## 2019-03-19 DIAGNOSIS — E039 Hypothyroidism, unspecified: Secondary | ICD-10-CM | POA: Diagnosis present

## 2019-03-19 DIAGNOSIS — Z791 Long term (current) use of non-steroidal anti-inflammatories (NSAID): Secondary | ICD-10-CM

## 2019-03-19 HISTORY — PX: RIGID ESOPHAGOSCOPY: SHX5226

## 2019-03-19 LAB — BASIC METABOLIC PANEL
Anion gap: 10 (ref 5–15)
BUN: 22 mg/dL (ref 8–23)
CO2: 23 mmol/L (ref 22–32)
Calcium: 9.4 mg/dL (ref 8.9–10.3)
Chloride: 107 mmol/L (ref 98–111)
Creatinine, Ser: 1.23 mg/dL — ABNORMAL HIGH (ref 0.44–1.00)
GFR calc Af Amer: 55 mL/min — ABNORMAL LOW (ref >60)
GFR calc non Af Amer: 47 mL/min — ABNORMAL LOW (ref >60)
Glucose, Bld: 126 mg/dL — ABNORMAL HIGH (ref 70–99)
Potassium: 4.2 mmol/L (ref 3.5–5.1)
Sodium: 140 mmol/L (ref 135–145)

## 2019-03-19 LAB — CBC
HCT: 39.3 % (ref 36.0–46.0)
Hemoglobin: 12.4 g/dL (ref 12.0–15.0)
MCH: 29.5 pg (ref 26.0–34.0)
MCHC: 31.6 g/dL (ref 30.0–36.0)
MCV: 93.3 fL (ref 80.0–100.0)
Platelets: 387 10*3/uL (ref 150–400)
RBC: 4.21 MIL/uL (ref 3.87–5.11)
RDW: 13.4 % (ref 11.5–15.5)
WBC: 5 10*3/uL (ref 4.0–10.5)
nRBC: 0 % (ref 0.0–0.2)

## 2019-03-19 LAB — SARS CORONAVIRUS 2 BY RT PCR (HOSPITAL ORDER, PERFORMED IN ~~LOC~~ HOSPITAL LAB): SARS Coronavirus 2: NEGATIVE

## 2019-03-19 SURGERY — ESOPHAGOSCOPY, RIGID
Anesthesia: General

## 2019-03-19 MED ORDER — DEXAMETHASONE SODIUM PHOSPHATE 10 MG/ML IJ SOLN
INTRAMUSCULAR | Status: DC | PRN
  Administered 2019-03-19: 10 mg via INTRAVENOUS

## 2019-03-19 MED ORDER — LIDOCAINE HCL 4 % EX SOLN
CUTANEOUS | Status: AC
  Filled 2019-03-19: qty 50

## 2019-03-19 MED ORDER — ALBUTEROL SULFATE HFA 108 (90 BASE) MCG/ACT IN AERS: 2.0000 | INHALATION_SPRAY | RESPIRATORY_TRACT | Status: DC | PRN

## 2019-03-19 MED ORDER — ONDANSETRON HCL 4 MG/2ML IJ SOLN: 4.0000 mg | Freq: Four times a day (QID) | INTRAMUSCULAR | Status: DC | PRN

## 2019-03-19 MED ORDER — LEVOTHYROXINE SODIUM 75 MCG PO TABS: 175.0000 ug | ORAL_TABLET | Freq: Every day | ORAL | Status: DC

## 2019-03-19 MED ORDER — IPRATROPIUM-ALBUTEROL 0.5-2.5 (3) MG/3ML IN SOLN: 3.0000 mL | RESPIRATORY_TRACT | Status: DC

## 2019-03-19 MED ORDER — MOMETASONE FURO-FORMOTEROL FUM 200-5 MCG/ACT IN AERO
2.0000 | INHALATION_SPRAY | Freq: Two times a day (BID) | RESPIRATORY_TRACT | Status: DC
  Filled 2019-03-19: qty 8.8

## 2019-03-19 MED ORDER — PROPOFOL 10 MG/ML IV BOLUS
INTRAVENOUS | Status: AC
  Filled 2019-03-19: qty 20

## 2019-03-19 MED ORDER — ALBUTEROL SULFATE (2.5 MG/3ML) 0.083% IN NEBU
2.5000 mg | INHALATION_SOLUTION | RESPIRATORY_TRACT | Status: DC | PRN
  Administered 2019-03-19: 2.5 mg via RESPIRATORY_TRACT

## 2019-03-19 MED ORDER — PROPOFOL 500 MG/50ML IV EMUL
INTRAVENOUS | Status: DC | PRN
  Administered 2019-03-19: 150 ug/kg/min via INTRAVENOUS

## 2019-03-19 MED ORDER — ANASTROZOLE 1 MG PO TABS
1.0000 mg | ORAL_TABLET | Freq: Every day | ORAL | Status: DC
  Filled 2019-03-19: qty 1

## 2019-03-19 MED ORDER — 0.9 % SODIUM CHLORIDE (POUR BTL) OPTIME
TOPICAL | Status: DC | PRN
  Administered 2019-03-19: 1000 mL

## 2019-03-19 MED ORDER — IBUPROFEN 100 MG/5ML PO SUSP: 400.0000 mg | Freq: Four times a day (QID) | ORAL | Status: DC | PRN

## 2019-03-19 MED ORDER — SODIUM CHLORIDE 0.9 % IV SOLN
3.0000 g | Freq: Once | INTRAVENOUS | Status: AC
  Administered 2019-03-19: 02:00:00 3 g via INTRAVENOUS
  Filled 2019-03-19: qty 8

## 2019-03-19 MED ORDER — PHENYLEPHRINE 40 MCG/ML (10ML) SYRINGE FOR IV PUSH (FOR BLOOD PRESSURE SUPPORT)
PREFILLED_SYRINGE | INTRAVENOUS | Status: AC
  Filled 2019-03-19: qty 10

## 2019-03-19 MED ORDER — ROCURONIUM BROMIDE 10 MG/ML (PF) SYRINGE
PREFILLED_SYRINGE | INTRAVENOUS | Status: AC
  Filled 2019-03-19: qty 30

## 2019-03-19 MED ORDER — LIDOCAINE 2% (20 MG/ML) 5 ML SYRINGE
INTRAMUSCULAR | Status: DC | PRN
  Administered 2019-03-19: 60 mg via INTRAVENOUS

## 2019-03-19 MED ORDER — DEXAMETHASONE SODIUM PHOSPHATE 10 MG/ML IJ SOLN
INTRAMUSCULAR | Status: AC
  Filled 2019-03-19: qty 1

## 2019-03-19 MED ORDER — ACETAMINOPHEN 650 MG RE SUPP: 650.0000 mg | Freq: Four times a day (QID) | RECTAL | Status: DC | PRN

## 2019-03-19 MED ORDER — LIDOCAINE HCL 2 % IJ SOLN
INTRAMUSCULAR | Status: AC
  Filled 2019-03-19: qty 20

## 2019-03-19 MED ORDER — ROCURONIUM BROMIDE 10 MG/ML (PF) SYRINGE
PREFILLED_SYRINGE | INTRAVENOUS | Status: DC | PRN
  Administered 2019-03-19: 30 mg via INTRAVENOUS

## 2019-03-19 MED ORDER — SUGAMMADEX SODIUM 200 MG/2ML IV SOLN
INTRAVENOUS | Status: DC | PRN
  Administered 2019-03-19: 200 mg via INTRAVENOUS

## 2019-03-19 MED ORDER — LIDOCAINE 2% (20 MG/ML) 5 ML SYRINGE
INTRAMUSCULAR | Status: AC
  Filled 2019-03-19: qty 5

## 2019-03-19 MED ORDER — DEXTROSE-NACL 5-0.45 % IV SOLN: INTRAVENOUS | Status: DC

## 2019-03-19 MED ORDER — LACTATED RINGERS IV SOLN
INTRAVENOUS | Status: DC | PRN
  Administered 2019-03-19: 16:00:00 via INTRAVENOUS

## 2019-03-19 MED ORDER — KETAMINE HCL 10 MG/ML IJ SOLN
INTRAMUSCULAR | Status: AC
  Filled 2019-03-19: qty 1

## 2019-03-19 MED ORDER — AMLODIPINE BESYLATE 10 MG PO TABS: 10.0000 mg | ORAL_TABLET | Freq: Every day | ORAL | Status: DC

## 2019-03-19 MED ORDER — FENTANYL CITRATE (PF) 100 MCG/2ML IJ SOLN
100.0000 ug | Freq: Once | INTRAMUSCULAR | Status: AC
  Administered 2019-03-19: 100 ug via INTRAVENOUS
  Filled 2019-03-19: qty 2

## 2019-03-19 MED ORDER — ONDANSETRON HCL 4 MG PO TABS: 4.0000 mg | ORAL_TABLET | Freq: Four times a day (QID) | ORAL | Status: DC | PRN

## 2019-03-19 MED ORDER — FENTANYL CITRATE (PF) 100 MCG/2ML IJ SOLN: 25.0000 ug | INTRAMUSCULAR | Status: DC | PRN

## 2019-03-19 MED ORDER — METOPROLOL TARTRATE 50 MG PO TABS: 50.0000 mg | ORAL_TABLET | Freq: Two times a day (BID) | ORAL | Status: DC

## 2019-03-19 MED ORDER — KETAMINE HCL 50 MG/5ML IJ SOSY: 100.0000 mg | PREFILLED_SYRINGE | Freq: Once | INTRAMUSCULAR | Status: DC

## 2019-03-19 MED ORDER — DIPHENHYDRAMINE HCL 25 MG PO CAPS: 25.0000 mg | ORAL_CAPSULE | Freq: Four times a day (QID) | ORAL | Status: DC

## 2019-03-19 MED ORDER — MIDAZOLAM HCL 2 MG/2ML IJ SOLN
INTRAMUSCULAR | Status: AC
  Filled 2019-03-19: qty 2

## 2019-03-19 MED ORDER — HYDRALAZINE HCL 25 MG PO TABS
25.0000 mg | ORAL_TABLET | Freq: Three times a day (TID) | ORAL | Status: DC | PRN
  Filled 2019-03-19: qty 1

## 2019-03-19 MED ORDER — PANTOPRAZOLE SODIUM 40 MG PO TBEC: 40.0000 mg | DELAYED_RELEASE_TABLET | Freq: Every day | ORAL | Status: DC

## 2019-03-19 MED ORDER — BUPROPION HCL ER (XL) 150 MG PO TB24
150.0000 mg | ORAL_TABLET | Freq: Every day | ORAL | Status: DC
  Filled 2019-03-19: qty 1

## 2019-03-19 MED ORDER — PROPOFOL 10 MG/ML IV BOLUS
INTRAVENOUS | Status: DC | PRN
  Administered 2019-03-19: 200 mg via INTRAVENOUS

## 2019-03-19 MED ORDER — ACETAMINOPHEN 325 MG PO TABS: 650.0000 mg | ORAL_TABLET | Freq: Four times a day (QID) | ORAL | Status: DC | PRN

## 2019-03-19 MED ORDER — SCOPOLAMINE 1 MG/3DAYS TD PT72
1.0000 | MEDICATED_PATCH | TRANSDERMAL | Status: DC
  Filled 2019-03-19: qty 1

## 2019-03-19 MED ORDER — ONDANSETRON HCL 4 MG/2ML IJ SOLN
INTRAMUSCULAR | Status: DC | PRN
  Administered 2019-03-19: 4 mg via INTRAVENOUS

## 2019-03-19 MED ORDER — AMLODIPINE BESYLATE 5 MG PO TABS: 5.0000 mg | ORAL_TABLET | Freq: Every day | ORAL | Status: DC

## 2019-03-19 MED ORDER — ONDANSETRON HCL 4 MG/2ML IJ SOLN
INTRAMUSCULAR | Status: AC
  Filled 2019-03-19: qty 2

## 2019-03-19 MED ORDER — MORPHINE SULFATE (PF) 2 MG/ML IV SOLN
2.0000 mg | INTRAVENOUS | Status: DC | PRN
  Administered 2019-03-19: 2 mg via INTRAVENOUS
  Filled 2019-03-19: qty 1

## 2019-03-19 MED ORDER — LORATADINE 10 MG PO TABS: 10.0000 mg | ORAL_TABLET | Freq: Every day | ORAL | Status: DC

## 2019-03-19 MED ORDER — SODIUM CHLORIDE 0.9 % IV SOLN
3.0000 g | Freq: Four times a day (QID) | INTRAVENOUS | Status: DC
  Administered 2019-03-19 (×2): 3 g via INTRAVENOUS
  Filled 2019-03-19 (×8): qty 8

## 2019-03-19 MED ORDER — HYDROMORPHONE HCL 1 MG/ML IJ SOLN
0.5000 mg | Freq: Once | INTRAMUSCULAR | Status: AC | PRN
  Administered 2019-03-19: 0.5 mg via INTRAVENOUS
  Filled 2019-03-19: qty 1

## 2019-03-19 MED ORDER — ALBUTEROL SULFATE (2.5 MG/3ML) 0.083% IN NEBU
INHALATION_SOLUTION | RESPIRATORY_TRACT | Status: AC
  Administered 2019-03-19: 2.5 mg
  Filled 2019-03-19: qty 3

## 2019-03-19 MED ORDER — ONDANSETRON HCL 4 MG/2ML IJ SOLN
4.0000 mg | Freq: Once | INTRAMUSCULAR | Status: AC
  Administered 2019-03-19: 4 mg via INTRAVENOUS
  Filled 2019-03-19: qty 2

## 2019-03-19 MED ORDER — FENTANYL CITRATE (PF) 250 MCG/5ML IJ SOLN
INTRAMUSCULAR | Status: AC
  Filled 2019-03-19: qty 5

## 2019-03-19 SURGICAL SUPPLY — 24 items
BLADE SURG 15 STRL LF DISP TIS (BLADE) IMPLANT
BLADE SURG 15 STRL SS (BLADE)
BRONCHOSCOPE PED SLIM DISP (MISCELLANEOUS) ×2 IMPLANT
CONT SPEC 4OZ CLIKSEAL STRL BL (MISCELLANEOUS) ×4 IMPLANT
COVER BACK TABLE 60X90IN (DRAPES) ×2 IMPLANT
COVER WAND RF STERILE (DRAPES) ×2 IMPLANT
DRAPE HALF SHEET 40X57 (DRAPES) ×2 IMPLANT
GAUZE SPONGE 4X4 12PLY STRL (GAUZE/BANDAGES/DRESSINGS) ×4 IMPLANT
GLOVE ECLIPSE 8.0 STRL XLNG CF (GLOVE) ×2 IMPLANT
GUARD TEETH (MISCELLANEOUS) ×2 IMPLANT
KIT TURNOVER KIT B (KITS) ×2 IMPLANT
MARKER SKIN DUAL TIP RULER LAB (MISCELLANEOUS) ×2 IMPLANT
NEEDLE HYPO 25GX1X1/2 BEV (NEEDLE) IMPLANT
NS IRRIG 1000ML POUR BTL (IV SOLUTION) ×2 IMPLANT
PAD ARMBOARD 7.5X6 YLW CONV (MISCELLANEOUS) ×4 IMPLANT
PATTIES SURGICAL 1X1 (DISPOSABLE) IMPLANT
POSITIONER HEAD DONUT 9IN (MISCELLANEOUS) IMPLANT
SPECIMEN JAR SMALL (MISCELLANEOUS) ×2 IMPLANT
SPONGE INTESTINAL PEANUT (DISPOSABLE) IMPLANT
SURGILUBE 2OZ TUBE FLIPTOP (MISCELLANEOUS) ×2 IMPLANT
TOWEL GREEN STERILE FF (TOWEL DISPOSABLE) ×4 IMPLANT
TRAP SPECIMEN MUCOUS 40CC (MISCELLANEOUS) IMPLANT
TUBE CONNECTING 12X1/4 (SUCTIONS) ×2 IMPLANT
WATER STERILE IRR 1000ML POUR (IV SOLUTION) ×2 IMPLANT

## 2019-03-19 NOTE — Progress Notes (Signed)
RT assisted Critical Care with bronch to retrieve Trach valve. Patient tolerated well. ENT is coming to evaluate. RT will continue to monitor.

## 2019-03-19 NOTE — Anesthesia Postprocedure Evaluation (Signed)
Anesthesia Post Note  Patient: Grace Blight Lasala  Procedure(s) Performed: FLEXIBLE ESOPHAGOSCOPY (N/A )     Patient location during evaluation: PACU Anesthesia Type: General Level of consciousness: awake and alert Pain management: pain level controlled Vital Signs Assessment: post-procedure vital signs reviewed and stable Respiratory status: spontaneous breathing, nonlabored ventilation, respiratory function stable and patient connected to nasal cannula oxygen Cardiovascular status: blood pressure returned to baseline and stable Postop Assessment: no apparent nausea or vomiting Anesthetic complications: no    Last Vitals:  Vitals:   03/19/19 1710 03/19/19 1720  BP: (!) 153/88 115/88  Pulse: 94 100  Resp: 20 19  Temp:    SpO2: 92% 95%    Last Pain:  Vitals:   03/19/19 1720  TempSrc:   PainSc: 0-No pain                 Tiajuana Amass

## 2019-03-19 NOTE — Anesthesia Preprocedure Evaluation (Signed)
Anesthesia Evaluation  Patient identified by MRN, date of birth, ID band Patient awake    Reviewed: Allergy & Precautions, NPO status , Patient's Chart, lab work & pertinent test results  Airway   TM Distance: >3 FB    Comment: laryngectomy Dental   Pulmonary asthma , COPD, former smoker,  S/p laryngectomy   breath sounds clear to auscultation       Cardiovascular hypertension, Pt. on medications +CHF   Rhythm:Regular Rate:Normal     Neuro/Psych Seizures -,     GI/Hepatic Neg liver ROS, GERD  ,  Endo/Other  Hypothyroidism   Renal/GU negative Renal ROS     Musculoskeletal  (+) Arthritis ,   Abdominal   Peds  Hematology  (+) anemia ,   Anesthesia Other Findings   Reproductive/Obstetrics                             Anesthesia Physical Anesthesia Plan  ASA: III  Anesthesia Plan: General   Post-op Pain Management:    Induction: Intravenous  PONV Risk Score and Plan: 3 and TIVA, Dexamethasone, Ondansetron and Treatment may vary due to age or medical condition  Airway Management Planned: Tracheostomy  Additional Equipment:   Intra-op Plan:   Post-operative Plan: Extubation in OR  Informed Consent: I have reviewed the patients History and Physical, chart, labs and discussed the procedure including the risks, benefits and alternatives for the proposed anesthesia with the patient or authorized representative who has indicated his/her understanding and acceptance.     Dental advisory given  Plan Discussed with: CRNA  Anesthesia Plan Comments:         Anesthesia Quick Evaluation

## 2019-03-19 NOTE — Progress Notes (Signed)
Had a failed bronchoscopy attempt to remove foreign body from left lower lobe bronchus which appears to be a clear plastic tube Discussed with [name] -they have better instrumentation at [facility] so would recommend transfer to [facility] so that she can go to the OR for another attempt at foreign body removal.  If ENT unable, then would have to involve thoracic surgery Have asked hospitalist to make arrangements for transfer to [facility] stepdown unit  Suleman Gunning V. Elsworth Soho MD

## 2019-03-19 NOTE — Progress Notes (Signed)
Received Deborah [name] from [employer] via [employer] .  Patient is awake, alert and oriented x 4.  Denies pain at this time.  Spouse is at bedside.  Trach collar place on compressed air by RN, page to RT for additional orders.  Right wrist saline lock intact, clean and dry.  CHG bath completed.  VSS, placed on cardiac monitor and verified with CCMD.  RT in to transition trach collar to 8 liters O2 with humidity.

## 2019-03-19 NOTE — ED Notes (Signed)
Pt c/o difficulty breathing, O2 sat 92%. RT called to suction pt trach

## 2019-03-19 NOTE — Op Note (Signed)
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  Relatively small external stoma.  Proc: With the patient in a comfortable supine position, general intravenous and tracheal stomal anesthesia was administered.  A routine surgical timeout was obtained.  The endotracheal tube was removed and the flexible bronchoscope was introduced.  Some thick frothy white phlegm was suctioned clear.  The prosthesis was visualized.  Additional ventilation per endotracheal tube was administered to improve oxygen saturation.  Endotracheal tube was removed and the flexible bronchoscope was reintroduced.  A flexible grasping forceps was introduced and under direct vision, the prosthesis was grasped and extracted.  The bronchoscope was reintroduced and there was no additional foreign bodies.  At this point the procedure was completed.  The patient was returned to anesthesia, awakened, extubated, and transferred to recovery in stable condition.  Dispo:   OR to PACU to home  Plan: She will need to get the prosthesis replaced at [facility] as per her usual.  It may need to be resized so she does not continue to cough it out.  Tyson Alias MD

## 2019-03-19 NOTE — Progress Notes (Signed)
Rt called to room WA 15 for SOB. RT placed TC back over stoma. Pt stated she could fill air flow and was feeling better. Pt waiting on transport to [facility] for plan to remove foreign body from lungs.

## 2019-03-19 NOTE — Consult Note (Signed)
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 She is followed by [name]. She last saw SLP in [name] on 10/2. She had a TEP change 2/2 leak through central bore. She last saw ENT ([name]) on 8/14. She had an esophagoscopy with dilatation with [name] 03/07/2019.  She has had aspiration of part of the TEP prior. She had a bronch on 7/26 with the disposible scope and they were unable to retreive the foreign body. She ultimately had rigid bronch in OR on 7/26.   She reports she was coughing tonight when she felt the foreign body fall in. She has some chest discomfort over the left mid part of her chest. She has had emesis, no coffee ground or hematemesis. She has some dyspnea. She denies fever, headache, rash, leg edema, myalgias otherwise.  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  Scattered white thick mucous.  Significant Diagnostic Tests:  CXR 10/3: triangular opacity in left midchest, left base airspace opacification  Micro Data:  COVID19> neg  Antimicrobials:  Unasyn 10/3>  Interim history/subjective:  Sitting in bed on trach collar  Objective   Blood pressure (!) 161/84, pulse 91, temperature 97.6 F (36.4 C), temperature source Oral, resp. rate (!) 27, SpO2 90 %.       No intake or output data in the 24 hours ending 03/19/19 0503 There were no vitals filed for this visit.  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  Foreign body aspiration in left lower lobe bronchus: Some atelectasis on chest xray but does not appear to have complete collapse as there is likely airflow through the foreign body. Attempted to retreive at bedside in ED with Ambuscope but was unable to.  --Discussed with ENT ([name]) on phone who recommended therapeutic, flexible bronchoscope in AM to attempt to retrieve the object. Will plan to keep them in the loop in case she does need rigid bronch. --Please keep her NPO, may still have meds --Can admit to hospitalist --Maintain O2 sat > 89% with supplemental oxygen --May continue Unasyn  --Will continue to follow  Best practice:  Diet: NPO Pain/Anxiety/Delirium protocol (if indicated): per  primary VAP protocol (if indicated): N/A DVT prophylaxis: per primary GI prophylaxis: N/A Glucose control: monitor Mobility: OOB as  tolerated Code Status: Full Family Communication: Discussed with patient Disposition: inpatient  Labs   CBC: Recent Labs  Lab 03/19/19 0135  WBC 5.0  HGB 12.4  HCT 39.3  MCV 93.3  PLT 419    Basic Metabolic Panel: Recent Labs  Lab 03/19/19 0135  NA 140  K 4.2  CL 107  CO2 23  GLUCOSE 126*  BUN 22  CREATININE 1.23*  CALCIUM 9.4   GFR: Estimated Creatinine Clearance: 58.8 mL/min (A) (by C-G formula based on SCr of 1.23 mg/dL (H)). Recent Labs  Lab 03/19/19 0135  WBC 5.0    Liver Function Tests: No results for input(s): AST, ALT, ALKPHOS, BILITOT, PROT, ALBUMIN in the last 168 hours. No results for input(s): LIPASE, AMYLASE in the last 168 hours. No results for input(s): AMMONIA in the last 168 hours.  ABG    Component Value Date/Time   PHART 7.445 08/02/2012 1959   PCO2ART 39.8 08/02/2012 1959   PO2ART 94.5 08/02/2012 1959   HCO3 27.0 (H) 08/02/2012 1959   TCO2 24.5 08/02/2012 1959   O2SAT 97.4 08/02/2012 1959     Coagulation Profile: No results for input(s): INR, PROTIME in the last 168 hours.  Cardiac Enzymes: No results for input(s): CKTOTAL, CKMB, CKMBINDEX, TROPONINI in the last 168 hours.  HbA1C: No results found for: HGBA1C  CBG: No results for input(s): GLUCAP in the last 168 hours.  Review of Systems:   Complete 12 point review of system performed and negative except per HPI  Past Medical History  She,  has a past medical history of Anemia, Arthritis, Asthma, Breast cancer ([place]), Bronchitis, COPD (chronic obstructive pulmonary disease) ([place]), Diverticulosis (11/15/2012 ), Dyspnea, Esophageal stricture, Former smoker (03/19/2011), GERD (gastroesophageal reflux disease), Heart murmur, History of laryngectomy, History of radiation therapy (11/15/18- 12/06/18), radiation therapy (09/03/10 to 10/16/2010), Hypertension, Hypothyroid, Internal hemorrhoid (11/15/2012 ), Larynx cancer ([name]) (07/31/2010), Leukocytopenia, Nausea alone (07/28/2013), Neck pain  (01/21/2012), Normal MRI (07/14/11), Pneumonia (2012), Sciatica, Seizures ([place]), Sepsis ([place]) (08/04/12), Sinusitis, chronic (07/20/2011), and 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[place]).   Surgical History    Past Surgical History:  Procedure Laterality Date  . BREAST LUMPECTOMY Right 07/06/2018  . BREAST LUMPECTOMY WITH RADIOACTIVE SEED LOCALIZATION Right 07/06/2018   Procedure: RIGHT BREAST LUMPECTOMY WITH RADIOACTIVE SEED LOCALIZATION;  Surgeon: Rolm Bookbinder, MD;  Location: [place];  Service: General;  Laterality: Right;  . COLONOSCOPY N/A 11/15/2012   Procedure: COLONOSCOPY;  Surgeon: Lafayette Dragon, MD;  Location: [facility];  Service: Endoscopy;  Laterality: N/A;  . DENTAL RESTORATION/EXTRACTION WITH X-RAY    . ESOPHAGOGASTRODUODENOSCOPY (EGD) WITH PROPOFOL N/A 08/12/2018   Procedure: ESOPHAGOGASTRODUODENOSCOPY (EGD) WITH PROPOFOL;  Surgeon: Doran Stabler, MD;  Location: [place];  Service: Gastroenterology;  Laterality: N/A;  . ESOPHAGOSCOPY  06/21/2012   Procedure: ESOPHAGOSCOPY;  Surgeon: [name], MD;  Location: [place];  Service: ENT;  Laterality: N/A;  . ESOPHAGOSCOPY WITH DILITATION N/A 09/21/2014   Procedure: ESOPHAGOSCOPY WITH DILITATION;  Surgeon: [name], MD;  Location: [place];  Service: ENT;  Laterality: N/A;  . ESOPHAGOSCOPY WITH DILITATION N/A 07/04/2016   Procedure: ESOPHAGOSCOPY WITH DILITATION;  Surgeon: [name], MD;  Location: [place];  Service: ENT;  Laterality: N/A;  . ESOPHAGOSCOPY WITH DILITATION N/A 12/01/2017   Procedure: ESOPHAGOSCOPY WITH DILITATION;  Surgeon: [name], MD;  Location: [place];  Service: ENT;  Laterality: N/A;  .  ESOPHAGOSCOPY WITH DILITATION N/A 04/19/2018   Procedure: ESOPHAGOSCOPY WITH DILITATION;  Surgeon: [name], MD;  Location: [place];  Service: ENT;  Laterality: N/A;  . ESOPHAGOSCOPY WITH DILITATION N/A 03/07/2019   Procedure: Esophagoscopy with dilatation;  Surgeon: [name], MD;  Location: [place];   Service: ENT;  Laterality: N/A;  . FLEXIBLE BRONCHOSCOPY  01/08/2018      . FOREIGN BODY REMOVAL BRONCHIAL  10/02/2011   Procedure: REMOVAL FOREIGN BODY BRONCHIAL;  Surgeon: Ruby Cola, MD;  Location: [place];  Service: ENT;  Laterality: N/A;  . FOREIGN BODY REMOVAL BRONCHIAL N/A 01/08/2018   Procedure: REMOVAL FOREIGN BODY BRONCHIAL;  Surgeon: Jodi Marble, MD;  Location: [facility];  Service: ENT;  Laterality: N/A;  . LARYNGECTOMY    . Porta cath removal    . PORTACATH PLACEMENT  09/17/10   Tip in cavoatrial junction  . RE-EXCISION OF BREAST CANCER,SUPERIOR MARGINS Right 07/29/2018   Procedure: RE-EXCISION OF RIGHT BREAST MEDIAL MARGINS;  Surgeon: Rolm Bookbinder, MD;  Location: [place];  Service: General;  Laterality: Right;  . STOMAPLASTY N/A 10/21/2016   Procedure: Zola Button;  Surgeon: [name], MD;  Location: [place];  Service: ENT;  Laterality: N/A;  . TRACHEAL DILITATION  07/16/2011   Procedure: TRACHEAL DILITATION;  Surgeon: Beckie Salts, MD;  Location: [place];  Service: ENT;  Laterality: N/A;  dilation of tracheal stoma and replacement of stoma tube  . TUBAL LIGATION  1982     Social History   reports that she quit smoking about 8 years ago. Her smoking use included cigarettes. She has a 40.00 pack-year smoking history. She has never used smokeless tobacco. She reports current alcohol use. She reports that she does not use drugs.   Family History   Her family history includes Cancer in her brother and sister; Diabetes in her sister; Heart disease in her father, mother, and sister; Liver disease in her maternal grandmother. There is no history of Breast cancer.   Allergies Allergies  Allergen Reactions  . Lisinopril Cough     Home Medications  Prior to Admission medications   Medication Sig Start Date End Date Taking? Authorizing Provider  albuterol (PROVENTIL) (2.5 MG/3ML) 0.083% nebulizer solution Take 3 mLs (2.5 mg total) by nebulization every 6 (six) hours as needed for  wheezing or shortness of breath. 11/23/18   Bufford Lope, DO  amLODipine (NORVASC) 10 MG tablet Take 1 tablet (10 mg total) by mouth daily. 02/18/19   Benay Pike, MD  anastrozole (ARIMIDEX) 1 MG tablet Take 1 tablet (1 mg total) by mouth daily. 09/06/18   Nicholas Lose, MD  buPROPion (WELLBUTRIN XL) 150 MG 24 hr tablet Take 1 tablet (150 mg total) by mouth daily. 04/08/18   Bufford Lope, DO  chlorpheniramine (CHLOR-TRIMETON) 4 MG tablet Take 4 mg by mouth daily.    [provider]  doxycycline (VIBRA-TABS) 100 MG tablet Take 100 mg by mouth 2 (two) times daily. 08/13/18   [provider]  EUTHYROX 175 MCG tablet TAKE 1 TABLET BY MOUTH ONCE DAILY BEFORE BREAKFAST 02/17/19   Benay Pike, MD  furosemide (LASIX) 20 MG tablet Take 1 tablet (20 mg total) by mouth daily. PT needs to call and make appt for further refills - 1st attempt 01/18/19   [name], PA  loratadine (CLARITIN) 10 MG tablet Take 1 tablet (10 mg total) by mouth daily. 11/22/18   Bufford Lope, DO  losartan (COZAAR) 100 MG tablet Take  1 tablet (100 mg total) by mouth daily. 07/14/18   Bufford Lope, DO  meloxicam (MOBIC) 15 MG tablet TAKE 1 TABLET BY MOUTH ONCE DAILY AS NEEDED 12/21/18   Lilia Argue R, DO  metoprolol tartrate (LOPRESSOR) 50 MG tablet Take 1 tablet by mouth twice daily 10/15/18   Tanda Rockers, MD  mometasone-formoterol (DULERA) 200-5 MCG/ACT AERO Inhale 2 puffs into the lungs 2 (two) times daily. 08/12/18   Bufford Lope, DO  nystatin cream (MYCOSTATIN) Apply 1 application topically 2 (two) times daily as needed (rash). 02/18/19   Benay Pike, MD  omeprazole (PRILOSEC) 40 MG capsule Take 1 capsule (40 mg total) by mouth 2 (two) times daily. 01/18/19   Benay Pike, MD  PROVENTIL HFA 108 [phone] Base) MCG/ACT inhaler INHALE 2 PUFFS BY MOUTH EVERY 4 HOURS AS NEEDED OR SHORTNESS OF BREATH 09/13/18   Orson Eva J, DO  TRANSDERM-SCOP, 1.5 MG, 1 MG/3DAYS PLACE 1 PATCH ONTO THE SKIN EVERY 3 DAYS AS NEEDED  07/15/18   Bufford Lope, DO     Critical care time: The patient is critically ill with multiple organ systems failure and requires high complexity decision making for assessment and support, frequent evaluation and titration of therapies, application of advanced monitoring technologies and extensive interpretation of multiple databases.   Jacques Earthly, M.D. [facility] Pulmonary/Critical Care Medicine After hours pager: [phone].

## 2019-03-19 NOTE — Progress Notes (Signed)
Received call from OR for report, transferred to Short Stay and report given to [place], [place].  Patient transferred to Short Stay via stretcher.

## 2019-03-19 NOTE — Transfer of Care (Signed)
Immediate Anesthesia Transfer of Care Note  Patient: Deborah [name]  Procedure(s) Performed: FLEXIBLE ESOPHAGOSCOPY (N/A )  Patient Location: PACU  Anesthesia Type:General  Level of Consciousness: awake, alert  and oriented  Airway & Oxygen Therapy: Patient Spontanous Breathing and Patient connected to T-piece oxygen  Post-op Assessment: Report given to RN and Post -op Vital signs reviewed and stable  Post vital signs: Reviewed and stable  Last Vitals:  Vitals Value Taken Time  BP 118/105 03/19/19 1703  Temp    Pulse 106 03/19/19 1707  Resp 18 03/19/19 1707  SpO2 91 % 03/19/19 1707  Vitals shown include unvalidated device data.  Last Pain:  Vitals:   03/19/19 1516  TempSrc: Oral  PainSc: 0-No pain         Complications: No apparent anesthesia complications

## 2019-03-19 NOTE — Progress Notes (Signed)
Pharmacy Antibiotic Note  Deborah [name] is a 63 y.o. female admitted on 03/19/2019 with aspiration pneumonia.  Pharmacy has been consulted for unasyn dosing.  Plan: Unasyn 3 Gm IV q6h F/u scr/cultures     Temp (24hrs), Avg:97.6 F (36.4 C), Min:97.6 F (36.4 C), Max:97.6 F (36.4 C)  Recent Labs  Lab 03/19/19 0135  WBC 5.0  CREATININE 1.23*    Estimated Creatinine Clearance: 58.8 mL/min (A) (by C-G formula based on SCr of 1.23 mg/dL (H)).    Allergies  Allergen Reactions  . Lisinopril Cough    Antimicrobials this admission: 10/3 unasyn >>    >>   Dose adjustments this admission:   Microbiology results:  BCx:   UCx:    Sputum:    MRSA PCR:   Thank you for allowing pharmacy to be a part of this patient's care.  Dorrene German 03/19/2019 5:51 AM

## 2019-03-19 NOTE — Progress Notes (Signed)
Discharge instructions given to Deborah and Deborah [name].  Discussed signs and symptoms to watch for and when to contact the physician.  Verbalized understanding.

## 2019-03-19 NOTE — ED Notes (Signed)
Patient was given CHG cloths for bath.

## 2019-03-19 NOTE — H&P (Signed)
History and Physical    Deborah [name] ZDG:[id number] DOB: Apr 11, 1956 DOA: 03/19/2019  Referring MD/NP/PA:   PCP: Benay Pike, MD   Patient coming from:  The patient is coming from home.  At baseline, pt is independent for most of ADL.        Chief Complaint: Foreign body aspiration  HPI: Deborah [name] is a 63 y.o. 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  Pt had hx of several times of foreign body aspiration. She has had aspiration of part of the TEP prior. She had a bronch on 7/26 with the disposible scope and they were unable to retreive the foreign body. She ultimately had rigid bronch in OR on 7/26. Today patient states that she aspirated her TEP voice box "prothesis", a device that aids her in vocalization but does not assist with breathing.  She has cough and shortness of breath.  Oxygen saturation was 75% in ED. no chest pain.  Patient has nausea and vomited few times.  Currently no nausea, vomiting, diarrhea or abdominal pain.  No hematemesis or hemoptysis. Denies symptoms of UTI or unilateral weakness. She is followed by [facility] for TEP prosthesis changes. PCCM, Dr. Randell Patient was consulted. He did bedside bronchoscopy, but was not successful for removing the foreign body.  ED Course: pt was found to have WBC 5.6, negative COVID-19 test, mild AKI with creatinine 1.23, BUN 22, temperature normal, blood pressure 161/84, heart rate 80-90s, tachypnea, oxygen saturation currently 90% on 2 L oxygen.  Patient is admitted to stepdown as inpatient. ENT, [name] was consulted. Likely flexible bronchoscope in AM  # CXR showed: 1. No tracheostomy tube identified. 2. Triangular opacity projecting over the left mid chest medially, indeterminate for small  foreign body or artifact. CT could be obtained for further evaluation if clinically indicated 3. Airspace disease at the left base, may reflect atelectasis or pneumonia.  Review of Systems:   General: no fevers, chills, no body weight gain has fatigue HEENT: no blurry vision, hearing changes or sore throat Respiratory: has dyspnea, coughing, no wheezing CV: no chest pain, no palpitations GI: has nausea, vomiting, no abdominal pain, diarrhea, constipation GU: no dysuria, burning on urination, increased urinary frequency, hematuria  Ext: no leg edema Neuro: no unilateral weakness, numbness, or tingling, no vision change or hearing loss Skin: no rash, no skin tear. MSK: No muscle spasm, no deformity, no limitation of range of movement in spin Heme: No easy bruising.  Travel history: No recent long distant travel.  Allergy:  Allergies  Allergen Reactions  . Lisinopril Cough    Past Medical History:  Diagnosis Date  . Anemia   . Arthritis    in knees  . Asthma   . Breast cancer ([place])   . Bronchitis   . COPD (chronic obstructive pulmonary disease) ([place])   . Diverticulosis 11/15/2012    noted on screening colonoscopy   . Dyspnea    with COPD exerbation  . Esophageal stricture   . Former smoker 03/19/2011  . GERD (gastroesophageal reflux disease)   . Heart murmur    asymptomatic   . History of laryngectomy   . History of radiation therapy 11/15/18- 12/06/18   Right Breast total dose 42.56 Gy in 16 fractions.   [name] Hx of radiation therapy 09/03/10 to 10/16/2010   supraglottic larynx  .  Hypertension   . 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 Internal hemorrhoid 11/15/2012    small, noted on screening colonoscopy   . Larynx cancer ([place]) 07/31/2010   supraglotttic s/p chemo/radiation and surgical rescection.  . Leukocytopenia   . Nausea alone 07/28/2013  . Neck pain 01/21/2012  . Normal MRI 07/14/11   negative for mestasis   . Pneumonia 2012  . Sciatica   . Seizures ([place])    07/24/11 off  Effexor w/o seizure  . Sepsis ([place]) 08/04/12  . Sinusitis, chronic 07/20/2011   Bilateral maxillary, identified on MRI of head 07/14/11.    . Tracheostomy dependent [facility])     Past Surgical History:  Procedure Laterality Date  . BREAST LUMPECTOMY Right 07/06/2018  . BREAST LUMPECTOMY WITH RADIOACTIVE SEED LOCALIZATION Right 07/06/2018   Procedure: RIGHT BREAST LUMPECTOMY WITH RADIOACTIVE SEED LOCALIZATION;  Surgeon: Rolm Bookbinder, MD;  Location: [place];  Service: General;  Laterality: Right;  . COLONOSCOPY N/A 11/15/2012   Procedure: COLONOSCOPY;  Surgeon: Lafayette Dragon, MD;  Location: [facility];  Service: Endoscopy;  Laterality: N/A;  . DENTAL RESTORATION/EXTRACTION WITH X-RAY    . ESOPHAGOGASTRODUODENOSCOPY (EGD) WITH PROPOFOL N/A 08/12/2018   Procedure: ESOPHAGOGASTRODUODENOSCOPY (EGD) WITH PROPOFOL;  Surgeon: Doran Stabler, MD;  Location: [place];  Service: Gastroenterology;  Laterality: N/A;  . ESOPHAGOSCOPY  06/21/2012   Procedure: ESOPHAGOSCOPY;  Surgeon: [name], MD;  Location: [place];  Service: ENT;  Laterality: N/A;  . ESOPHAGOSCOPY WITH DILITATION N/A 09/21/2014   Procedure: ESOPHAGOSCOPY WITH DILITATION;  Surgeon: [name], MD;  Location: [place];  Service: ENT;  Laterality: N/A;  . ESOPHAGOSCOPY WITH DILITATION N/A 07/04/2016   Procedure: ESOPHAGOSCOPY WITH DILITATION;  Surgeon: [name], MD;  Location: [place];  Service: ENT;  Laterality: N/A;  . ESOPHAGOSCOPY WITH DILITATION N/A 12/01/2017   Procedure: ESOPHAGOSCOPY WITH DILITATION;  Surgeon: [name], MD;  Location: [place];  Service: ENT;  Laterality: N/A;  . ESOPHAGOSCOPY WITH DILITATION N/A 04/19/2018   Procedure: ESOPHAGOSCOPY WITH DILITATION;  Surgeon: [name], MD;  Location: [place];  Service: ENT;  Laterality: N/A;  . ESOPHAGOSCOPY WITH DILITATION N/A 03/07/2019   Procedure: Esophagoscopy with dilatation;  Surgeon: [name], MD;  Location: [place];  Service: ENT;   Laterality: N/A;  . FLEXIBLE BRONCHOSCOPY  01/08/2018      . FOREIGN BODY REMOVAL BRONCHIAL  10/02/2011   Procedure: REMOVAL FOREIGN BODY BRONCHIAL;  Surgeon: Ruby Cola, MD;  Location: [place];  Service: ENT;  Laterality: N/A;  . FOREIGN BODY REMOVAL BRONCHIAL N/A 01/08/2018   Procedure: REMOVAL FOREIGN BODY BRONCHIAL;  Surgeon: Jodi Marble, MD;  [facility];  Service: ENT;  Laterality: N/A;  . LARYNGECTOMY    . Porta cath removal    . PORTACATH PLACEMENT  09/17/10   Tip in cavoatrial junction  . RE-EXCISION OF BREAST CANCER,SUPERIOR MARGINS Right 07/29/2018   Procedure: RE-EXCISION OF RIGHT BREAST MEDIAL MARGINS;  Surgeon: Rolm Bookbinder, MD;  Location: [place];  Service: General;  Laterality: Right;  . STOMAPLASTY N/A 10/21/2016   Procedure: Zola Button;  Surgeon: [name], MD;  Location: [place];  Service: ENT;  Laterality: N/A;  . TRACHEAL DILITATION  07/16/2011   Procedure: TRACHEAL DILITATION;  Surgeon: Beckie Salts, MD;  Location: [place];  Service: ENT;  Laterality: N/A;  dilation of tracheal stoma and replacement of stoma tube  . TUBAL LIGATION  1982    Social History:  reports that  she quit smoking about 8 years ago. Her smoking use included cigarettes. She has a 40.00 pack-year smoking history. She has never used smokeless tobacco. She reports current alcohol use. She reports that she does not use drugs.  Family History:  Family History  Problem Relation Age of Onset  . Heart disease Mother   . Heart disease Father   . Heart disease Sister   . Cancer Brother        type unknown  . Liver disease Maternal Grandmother   . Cancer Sister        type unknown  . Diabetes Sister   . Breast cancer Neg Hx      Prior to Admission medications   Medication Sig Start Date End Date Taking? Authorizing Provider  amLODipine (NORVASC) 10 MG tablet Take 1 tablet (10 mg total) by mouth daily. 02/18/19  Yes Benay Pike, MD  anastrozole (ARIMIDEX) 1 MG tablet Take 1 tablet (1 mg  total) by mouth daily. 09/06/18  Yes Nicholas Lose, MD  buPROPion (WELLBUTRIN XL) 150 MG 24 hr tablet Take 1 tablet (150 mg total) by mouth daily. 04/08/18  Yes Orson Eva J, DO  chlorpheniramine (CHLOR-TRIMETON) 4 MG tablet Take 4 mg by mouth daily.   Yes [provider]  EUTHYROX 175 MCG tablet TAKE 1 TABLET BY MOUTH ONCE DAILY BEFORE BREAKFAST 02/17/19  Yes Benay Pike, MD  furosemide (LASIX) 20 MG tablet Take 1 tablet (20 mg total) by mouth daily. PT needs to call and make appt for further refills - 1st attempt 01/18/19  Yes Bhagat, Bhavinkumar, PA  loratadine (CLARITIN) 10 MG tablet Take 1 tablet (10 mg total) by mouth daily. 11/22/18  Yes Orson Eva J, DO  losartan (COZAAR) 100 MG tablet Take 1 tablet (100 mg total) by mouth daily. 07/14/18  Yes Orson Eva J, DO  metoprolol tartrate (LOPRESSOR) 50 MG tablet Take 1 tablet by mouth twice daily Patient taking differently: Take 50 mg by mouth 2 (two) times daily.  10/15/18  Yes Tanda Rockers, MD  mometasone-formoterol (DULERA) 200-5 MCG/ACT AERO Inhale 2 puffs into the lungs 2 (two) times daily. Patient taking differently: Inhale 2 puffs into the lungs 2 (two) times daily as needed for wheezing.  08/12/18  Yes Orson Eva J, DO  nystatin cream (MYCOSTATIN) Apply 1 application topically 2 (two) times daily as needed (rash). 02/18/19  Yes Benay Pike, MD  omeprazole (PRILOSEC) 40 MG capsule Take 1 capsule (40 mg total) by mouth 2 (two) times daily. 01/18/19  Yes Benay Pike, MD  TRANSDERM-SCOP, 1.5 MG, 1 MG/3DAYS PLACE 1 PATCH ONTO THE SKIN EVERY 3 DAYS AS NEEDED Patient taking differently: Place 1 patch onto the skin daily as needed. Nausea 07/15/18  Yes Orson Eva J, DO  albuterol (PROVENTIL) (2.5 MG/3ML) 0.083% nebulizer solution Take 3 mLs (2.5 mg total) by nebulization every 6 (six) hours as needed for wheezing or shortness of breath. 11/23/18   Bufford Lope, DO  meloxicam (MOBIC) 15 MG tablet TAKE 1 TABLET BY MOUTH ONCE DAILY AS NEEDED  Patient not taking: Reported on 03/19/2019 12/21/18   Thurman Coyer, DO  PROVENTIL HFA 108 (90 Base) MCG/ACT inhaler INHALE 2 PUFFS BY MOUTH EVERY 4 HOURS AS NEEDED OR SHORTNESS OF BREATH Patient taking differently: Inhale 2 puffs into the lungs every 4 (four) hours as needed for wheezing.  09/13/18   Bufford Lope, DO    Physical Exam: Vitals:   03/19/19 2947 03/19/19 0320  03/19/19 0325 03/19/19 0335  BP: (!) 115/92 (!) 145/87 (!) 147/82 (!) 161/84  Pulse: 84 87 80 91  Resp: (!) 27     Temp:      TempSrc:      SpO2: (!) 87% (!) 88% (!) 85% 90%   General: Not in acute distress HEENT:       Eyes: PERRL, EOMI, no scleral icterus.       ENT: No discharge from the ears and nose, no pharynx injection, no tonsillar enlargement.        Neck: No JVD, no bruit, no mass felt. Heme: No neck lymph node enlargement. Cardiac: S1/S2, RRR, No gallops or rubs. Respiratory:  No rales, wheezing, rhonchi or rubs. S/p of tracheostomy GI: Soft, nondistended, nontender, no rebound pain, no organomegaly, BS present. GU: No hematuria Ext: No pitting leg edema bilaterally. 2+DP/PT pulse bilaterally. Musculoskeletal: No joint deformities, No joint redness or warmth, no limitation of ROM in spin. Skin: No rashes.  Neuro: Alert, oriented X3, cranial nerves II-XII grossly intact, moves all extremities normally.  Pysych: Patient is not psychotic, no suicidal or hemocidal ideation.  Labs on Admission: I have personally reviewed following labs and imaging studies  CBC: Recent Labs  Lab 03/19/19 0135  WBC 5.0  HGB 12.4  HCT 39.3  MCV 93.3  PLT 295   Basic Metabolic Panel: Recent Labs  Lab 03/19/19 0135  NA 140  K 4.2  CL 107  CO2 23  GLUCOSE 126*  BUN 22  CREATININE 1.23*  CALCIUM 9.4   GFR: Estimated Creatinine Clearance: 58.8 mL/min (A) (by C-G formula based on SCr of 1.23 mg/dL (H)). Liver Function Tests: No results for input(s): AST, ALT, ALKPHOS, BILITOT, PROT, ALBUMIN in the last 168  hours. No results for input(s): LIPASE, AMYLASE in the last 168 hours. No results for input(s): AMMONIA in the last 168 hours. Coagulation Profile: No results for input(s): INR, PROTIME in the last 168 hours. Cardiac Enzymes: No results for input(s): CKTOTAL, CKMB, CKMBINDEX, TROPONINI in the last 168 hours. BNP (last 3 results) No results for input(s): PROBNP in the last 8760 hours. HbA1C: No results for input(s): HGBA1C in the last 72 hours. CBG: No results for input(s): GLUCAP in the last 168 hours. Lipid Profile: No results for input(s): CHOL, HDL, LDLCALC, TRIG, CHOLHDL, LDLDIRECT in the last 72 hours. Thyroid Function Tests: No results for input(s): TSH, T4TOTAL, FREET4, T3FREE, THYROIDAB in the last 72 hours. Anemia Panel: No results for input(s): VITAMINB12, FOLATE, FERRITIN, TIBC, IRON, RETICCTPCT in the last 72 hours. Urine analysis:    Component Value Date/Time   COLORURINE YELLOW 01/30/2016 2359   APPEARANCEUR CLOUDY (A) 01/30/2016 2359   LABSPEC 1.018 01/30/2016 2359   PHURINE 5.5 01/30/2016 2359   GLUCOSEU NEGATIVE 01/30/2016 2359   HGBUR NEGATIVE 01/30/2016 2359   BILIRUBINUR NEGATIVE 01/30/2016 2359   BILIRUBINUR negative 05/27/2012 1500   KETONESUR NEGATIVE 01/30/2016 2359   PROTEINUR NEGATIVE 01/30/2016 2359   UROBILINOGEN 0.2 05/27/2012 1500   UROBILINOGEN 0.2 11/19/2010 1331   NITRITE NEGATIVE 01/30/2016 2359   LEUKOCYTESUR NEGATIVE 01/30/2016 2359   Sepsis Labs: _0 (procalcitonin:4,lacticidven:4) ) Recent Results (from the past 240 hour(s))  SARS Coronavirus 2 [facility] order, Performed in [facility] lab) Nasopharyngeal Nasopharyngeal Swab     Status: None   Collection Time: 03/19/19  1:35 AM   Specimen: Nasopharyngeal Swab  Result Value Ref Range Status   SARS Coronavirus 2 NEGATIVE NEGATIVE Final    Comment: (NOTE) If result is  NEGATIVE SARS-CoV-2 target nucleic acids are NOT DETECTED. The SARS-CoV-2 RNA is generally detectable  in upper and lower  respiratory specimens during the acute phase of infection. The lowest  concentration of SARS-CoV-2 viral copies this assay can detect is 250  copies / mL. A negative result does not preclude SARS-CoV-2 infection  and should not be used as the sole basis for treatment or other  patient management decisions.  A negative result may occur with  improper specimen collection / handling, submission of specimen other  than nasopharyngeal swab, presence of viral mutation(s) within the  areas targeted by this assay, and inadequate number of viral copies  (<250 copies / mL). A negative result must be combined with clinical  observations, patient history, and epidemiological information. If result is POSITIVE SARS-CoV-2 target nucleic acids are DETECTED. The SARS-CoV-2 RNA is generally detectable in upper and lower  respiratory specimens dur ing the acute phase of infection.  Positive  results are indicative of active infection with SARS-CoV-2.  Clinical  correlation with patient history and other diagnostic information is  necessary to determine patient infection status.  Positive results do  not rule out bacterial infection or co-infection with other viruses. If result is PRESUMPTIVE POSTIVE SARS-CoV-2 nucleic acids MAY BE PRESENT.   A presumptive positive result was obtained on the submitted specimen  and confirmed on repeat testing.  While 2019 novel coronavirus  (SARS-CoV-2) nucleic acids may be present in the submitted sample  additional confirmatory testing may be necessary for epidemiological  and / or clinical management purposes  to differentiate between  SARS-CoV-2 and other Sarbecovirus currently known to infect humans.  If clinically indicated additional testing with an alternate test  methodology [phone]) is advised. The SARS-CoV-2 RNA is generally  detectable in upper and lower respiratory sp ecimens during the acute  phase of infection. The expected result is  Negative. Fact Sheet for Patients:  StrictlyIdeas.no Fact Sheet for Healthcare Providers: BankingDealers.co.za This test is not yet approved or cleared by the [place] FDA and has been authorized for detection and/or diagnosis of SARS-CoV-2 by FDA under an Emergency Use Authorization (EUA).  This EUA will remain in effect (meaning this test can be used) for the duration of the COVID-19 declaration under Section 564(b)(1) of the Act, 21 U.S.C. section 360bbb-3(b)(1), unless the authorization is terminated or revoked sooner. Performed at [facility], [place] [address], [place], [place] [address]      Radiological Exams on Admission: Dg Chest 2 View  Result Date: 03/19/2019 CLINICAL DATA:  Swallowed foreign body trach fell down throat EXAM: CHEST - 2 VIEW COMPARISON:  11/17/2018 FINDINGS: The patient is rotated. There is suspected airspace disease at the left base. Stable cardiomediastinal silhouette. No pneumothorax. No tracheostomy tube is identified. Triangular opacity over the left mid chest medially. IMPRESSION: 1. No tracheostomy tube identified. 2. Triangular opacity projecting over the left mid chest medially, indeterminate for small foreign body or artifact. CT could be obtained for further evaluation if clinically indicated 3. Airspace disease at the left base, may reflect atelectasis or pneumonia. Electronically Signed   By: Donavan Foil M.D.   On: 03/19/2019 01:58     EKG: Not done in ED, will get one.   Assessment/Plan Principal Problem:   Foreign body aspiration Active Problems:   Hypothyroidism   Essential hypertension   Status post trachelectomy   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[place])   Ductal carcinoma in situ (DCIS) of right breast  Larynx cancer ([facility])   GERD (gastroesophageal reflux disease)   COPD (chronic obstructive pulmonary disease) ([facility])   Chronic diastolic (congestive) heart  failure ([place])   Foreign body aspiration: This is a recurrent issue.  Dr. Randell Patient from PCCM attempted to remove foreign body by bedside bronchoscopy without success.  ENT, [name] was consulted, likely to flexible bronchoscopy morning. - will admit to [facility] as inpt - NPO --Maintain bronchoscopy> 89% with supplemental oxygen per DR. Krall of PCCM - Unasyn was started in ED due to concern of aspiration pneumonia, will continue  COPD (chronic obstructive pulmonary disease): stable -Continue bronchodilators  Hypothyroidism: -Synthroid  Essential hypertension: -oral Hydralazine prn -Continue home medications: Amlodipine, metoprolol -Hold Lasix and Cozaar due to AKI  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[place]): -Wellbutrin  Ductal carcinoma in situ (DCIS) of right breast: -Continue letrozole  Larynx cancer ([place]) and Status post trachelectomy: s/p chemo/radiaton and laryngectomy with stoma and TEP. -consult RT  GERD (gastroesophageal reflux disease): -Protonix  Chronic diastolic (congestive) heart failure ([place]): 2D echo on 08/31/2017 showed EF 65-70% with grade 2 diastolic dysfunction.  Patient does not have leg edema JVD.  CHF seem to be compensated -Hold Lasix due to AKI -check BNP   Inpatient status:  # Patient requires inpatient status due to high intensity of service, high risk for further deterioration and high frequency of surveillance required.  I certify that at the point of admission it is my clinical judgment that the patient will require inpatient hospital care spanning beyond 2 midnights from the point of admission.  . 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 Now patient has presenting symptoms include presenting with foreign body aspiration, AKI . The initial  radiographic and laboratory data are worrisome because of foreign body by x-ray . Current medical needs: please see my assessment and plan . Predictability of an adverse outcome (risk): Patient has multiple complicated history, now presents with recurrent foreign body aspiration.  Bedside bronchoscopy was attempted by PCCM without success. Patient will need flexible bronchoscopy procedure.  Her presentation is highly complicated.  Patient is at high risk for deteriorating.  Will need to be treated in hospital for at least 2 days.   DVT ppx: SCD Code Status: Full code Family Communication:  Yes, patient's husband   at bed side Disposition Plan:  Anticipate discharge back to previous home environment Consults called:  Dr. Randell Patient from PCCM and [name] of ENT Admission status:  SDU/inpation       Date of Service 03/19/2019    [facility]   If 7PM-7AM, please contact night-coverage [email or web address] Password [email or web address] 03/19/2019, 7:14 AM

## 2019-03-19 NOTE — ED Notes (Signed)
[facility] has been called for patient transport. 

## 2019-03-19 NOTE — Progress Notes (Signed)
PHARMACY NOTE -  Unasyn  Pharmacy has been assisting with dosing of Unasyn for aspiration PNA.  Dosage remains stable at 3g IV q6 hr and need for further dosage adjustment appears unlikely at present given CrCl well above adjustment threshold  Pharmacy will sign off, following peripherally for culture results or dose adjustments. Please reconsult if a change in clinical status warrants re-evaluation of dosage.  Reuel Boom, PharmD, BCPS [phone] 03/19/2019, 1:56 PM

## 2019-03-19 NOTE — Anesthesia Procedure Notes (Signed)
Procedure Name: Intubation Date/Time: 03/19/2019 4:10 PM Performed by: Clearnce Sorrel, CRNA Pre-anesthesia Checklist: Patient identified, Emergency Drugs available, Suction available, Patient being monitored and Timeout performed Patient Re-evaluated:Patient Re-evaluated prior to induction Oxygen Delivery Method: Circle system utilized Preoxygenation: Pre-oxygenation with 100% oxygen Induction Type: IV induction Tube size: 6.5 (trough trach) mm Number of attempts: 1 Placement Confirmation: positive ETCO2 and breath sounds checked- equal and bilateral

## 2019-03-19 NOTE — ED Notes (Signed)
[name] called and wanted to know when the patient was going to have the procedure to remove the foreign body from her lung. Since this was unknown,the attending, Dr. Cyndia Skeeters was contacted and he requested Dr. Noreene Filbert number so they could decide when and where the procedure will be. Primary RN Cherie Ouch was was made aware

## 2019-03-19 NOTE — Progress Notes (Signed)
Admission from earlier this morning foreign body aspiration.  Discussed with PCCM, Dr. Elsworth Soho who discussed patient with ENT, [name], and recommended transfer to [facility] in case she has to go to OR and involve thoracic surgery.  Patient is currently stable saturating in 90s.  No distress other than some discomfort in her left chest likely from the foreign body.  I have notified patient's nurse about the transfer to [facility].  Will involve thoracic surgery if ENT is not able to remove the foreign body.

## 2019-03-19 NOTE — ED Provider Notes (Signed)
[facility] DEPT Provider Note   CSN: [id number] Arrival date & time: 03/19/19  0019     History   Chief Complaint Chief Complaint  Patient presents with  . Foreign Body    PM valve fell down her throat    HPI Deborah [name] is a 63 y.o. 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  The patient reports that she aspirated her TEP voice box "prothesis", a device that aids her in vocalization but does not assist with breathing, approximately 30 minutes prior to arrival.  She reports that she had a couple of episodes of vomiting after aspirating the device.  She states that she thinks that it is sitting in her esophagus.  On arrival to the ER, she is hypoxic on room air at 75%.  She does not use supplemental oxygen at home.  She is feeling short of breath, but denies pain.   Her significant other is at bedside and helps with history.  He states that they have been seen in the ER multiple times in the past after she has either swallowed or aspirated the device.  Her most recent ER visit in June required emergent bronchoscopy as she had aspirated her prosthesis.  She is followed by [facility] for TEP prosthesis changes.     The history is provided by the patient. No language interpreter was used.    Past Medical History:  Diagnosis Date  . Anemia   . Arthritis    in knees  . Asthma   . Breast cancer ([place])   . Bronchitis   . COPD (chronic obstructive pulmonary disease) ([place])   . Diverticulosis 11/15/2012    noted on screening colonoscopy   . Dyspnea    with COPD exerbation  . Esophageal stricture   . Former smoker 03/19/2011  . GERD (gastroesophageal reflux disease)   . Heart murmur    asymptomatic   . History of laryngectomy   . History of radiation therapy 11/15/18- 12/06/18   Right Breast  total dose 42.56 Gy in 16 fractions.   [name] Hx of radiation therapy 09/03/10 to 10/16/2010   supraglottic larynx  . Hypertension   . 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 Internal hemorrhoid 11/15/2012    small, noted on screening colonoscopy   . Larynx cancer ([place]) 07/31/2010   supraglotttic s/p chemo/radiation and surgical rescection.  . Leukocytopenia   . Nausea alone 07/28/2013  . Neck pain 01/21/2012  . Normal MRI 07/14/11   negative for mestasis   . Pneumonia 2012  . Sciatica   . Seizures ([place])    07/24/11 off Effexor w/o seizure  . Sepsis ([place]) 08/04/12  . Sinusitis, chronic 07/20/2011   Bilateral maxillary, identified on MRI of head 07/14/11.    . Tracheostomy dependent [facility])     Patient Active Problem List   Diagnosis Date Noted  . GERD (gastroesophageal reflux disease)   . COPD (chronic obstructive pulmonary disease) ([place])   . Chronic diastolic (congestive) heart failure ([place])   . Foreign body aspiration   . Breast cancer ([place]) 07/06/2018  . 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[place]) 04/08/2018  . Carpal tunnel syndrome 03/16/2018  . Obesity 08/24/2017  . Plantar fasciitis 06/06/2016  . Low back pain 05/21/2016  . Health  care maintenance 12/29/2015  . Osteoarthritis of knees, bilateral 05/31/2015  . Dysphagia 08/16/2014  . Weight gain 08/05/2014  . DOE (dyspnea on exertion) 04/28/2014  . Frequent falls 12/23/2013  . Bilateral shoulder pain 04/07/2013  . Anemia in chronic illness 08/11/2012  . Status post trachelectomy 08/04/2012  . Chronic pain 01/20/2012  . History of head and neck cancer 09/26/2011  . Hx of radiation therapy   . Tracheitis 07/17/2011  . Seizure ([place]) 07/17/2011  . Hypothyroidism 03/19/2011  . Essential hypertension 03/19/2011  . Larynx cancer ([place]) 07/31/2010    Past Surgical History:  Procedure Laterality Date  . BREAST LUMPECTOMY Right 07/06/2018  . BREAST LUMPECTOMY WITH  RADIOACTIVE SEED LOCALIZATION Right 07/06/2018   Procedure: RIGHT BREAST LUMPECTOMY WITH RADIOACTIVE SEED LOCALIZATION;  Surgeon: Rolm Bookbinder, MD;  Location: [place] [facility];  Service: General;  Laterality: Right;  . COLONOSCOPY N/A 11/15/2012   Procedure: COLONOSCOPY;  Surgeon: Lafayette Dragon, MD;  [facility];  Service: Endoscopy;  Laterality: N/A;  . DENTAL RESTORATION/EXTRACTION WITH X-RAY    . ESOPHAGOGASTRODUODENOSCOPY (EGD) WITH PROPOFOL N/A 08/12/2018   Procedure: ESOPHAGOGASTRODUODENOSCOPY (EGD) WITH PROPOFOL;  Surgeon: Doran Stabler, MD;  Location: [facility];  Service: Gastroenterology;  Laterality: N/A;  . ESOPHAGOSCOPY  06/21/2012   Procedure: ESOPHAGOSCOPY;  Surgeon: [name], MD;  Location: [place];  Service: ENT;  Laterality: N/A;  . ESOPHAGOSCOPY WITH DILITATION N/A 09/21/2014   Procedure: ESOPHAGOSCOPY WITH DILITATION;  Surgeon: [name], MD;  Location: [place];  Service: ENT;  Laterality: N/A;  . ESOPHAGOSCOPY WITH DILITATION N/A 07/04/2016   Procedure: ESOPHAGOSCOPY WITH DILITATION;  Surgeon: [name], MD;  Location: [place];  Service: ENT;  Laterality: N/A;  . ESOPHAGOSCOPY WITH DILITATION N/A 12/01/2017   Procedure: ESOPHAGOSCOPY WITH DILITATION;  Surgeon: [name], MD;  Location: [place];  Service: ENT;  Laterality: N/A;  . ESOPHAGOSCOPY WITH DILITATION N/A 04/19/2018   Procedure: ESOPHAGOSCOPY WITH DILITATION;  Surgeon: [name], MD;  Location: [place];  Service: ENT;  Laterality: N/A;  . ESOPHAGOSCOPY WITH DILITATION N/A 03/07/2019   Procedure: Esophagoscopy with dilatation;  Surgeon: [name], MD;  Location: [facility];  Service: ENT;  Laterality: N/A;  . FLEXIBLE BRONCHOSCOPY  01/08/2018      . FOREIGN BODY REMOVAL BRONCHIAL  10/02/2011   Procedure: REMOVAL FOREIGN BODY BRONCHIAL;  Surgeon: Ruby Cola, MD;  Location: [place];  Service: ENT;  Laterality: N/A;  . FOREIGN BODY REMOVAL BRONCHIAL N/A 01/08/2018   Procedure:  REMOVAL FOREIGN BODY BRONCHIAL;  Surgeon: Jodi Marble, MD;  [facility];  Service: ENT;  Laterality: N/A;  . LARYNGECTOMY    . Porta cath removal    . PORTACATH PLACEMENT  09/17/10   Tip in cavoatrial junction  . RE-EXCISION OF BREAST CANCER,SUPERIOR MARGINS Right 07/29/2018   Procedure: RE-EXCISION OF RIGHT BREAST MEDIAL MARGINS;  Surgeon: Rolm Bookbinder, MD;  Location: [place];  Service: General;  Laterality: Right;  . STOMAPLASTY N/A 10/21/2016   Procedure: Zola Button;  Surgeon: [name], MD;  Location: [place];  Service: ENT;  Laterality: N/A;  . TRACHEAL DILITATION  07/16/2011   Procedure: TRACHEAL DILITATION;  Surgeon: Beckie Salts, MD;  Location: [place];  Service: ENT;  Laterality: N/A;  dilation of tracheal stoma and replacement of stoma tube  . TUBAL LIGATION  1982     OB History   No obstetric history on file.      Home Medications    Prior to  Admission medications   Medication Sig Start Date End Date Taking? Authorizing Provider  amLODipine (NORVASC) 10 MG tablet Take 1 tablet (10 mg total) by mouth daily. 02/18/19  Yes Benay Pike, MD  anastrozole (ARIMIDEX) 1 MG tablet Take 1 tablet (1 mg total) by mouth daily. 09/06/18  Yes Nicholas Lose, MD  buPROPion (WELLBUTRIN XL) 150 MG 24 hr tablet Take 1 tablet (150 mg total) by mouth daily. 04/08/18  Yes Orson Eva J, DO  chlorpheniramine (CHLOR-TRIMETON) 4 MG tablet Take 4 mg by mouth daily.   Yes [provider]  EUTHYROX 175 MCG tablet TAKE 1 TABLET BY MOUTH ONCE DAILY BEFORE BREAKFAST 02/17/19  Yes Benay Pike, MD  furosemide (LASIX) 20 MG tablet Take 1 tablet (20 mg total) by mouth daily. PT needs to call and make appt for further refills - 1st attempt 01/18/19  Yes Bhagat, Bhavinkumar, PA  loratadine (CLARITIN) 10 MG tablet Take 1 tablet (10 mg total) by mouth daily. 11/22/18  Yes Orson Eva J, DO  losartan (COZAAR) 100 MG tablet Take 1 tablet (100 mg total) by mouth daily. 07/14/18  Yes Orson Eva J, DO   metoprolol tartrate (LOPRESSOR) 50 MG tablet Take 1 tablet by mouth twice daily Patient taking differently: Take 50 mg by mouth 2 (two) times daily.  10/15/18  Yes Tanda Rockers, MD  mometasone-formoterol (DULERA) 200-5 MCG/ACT AERO Inhale 2 puffs into the lungs 2 (two) times daily. Patient taking differently: Inhale 2 puffs into the lungs 2 (two) times daily as needed for wheezing.  08/12/18  Yes Orson Eva J, DO  nystatin cream (MYCOSTATIN) Apply 1 application topically 2 (two) times daily as needed (rash). 02/18/19  Yes Benay Pike, MD  omeprazole (PRILOSEC) 40 MG capsule Take 1 capsule (40 mg total) by mouth 2 (two) times daily. 01/18/19  Yes Benay Pike, MD  TRANSDERM-SCOP, 1.5 MG, 1 MG/3DAYS PLACE 1 PATCH ONTO THE SKIN EVERY 3 DAYS AS NEEDED Patient taking differently: Place 1 patch onto the skin daily as needed. Nausea 07/15/18  Yes Orson Eva J, DO  albuterol (PROVENTIL) (2.5 MG/3ML) 0.083% nebulizer solution Take 3 mLs (2.5 mg total) by nebulization every 6 (six) hours as needed for wheezing or shortness of breath. 11/23/18   Bufford Lope, DO  meloxicam (MOBIC) 15 MG tablet TAKE 1 TABLET BY MOUTH ONCE DAILY AS NEEDED Patient not taking: Reported on 03/19/2019 12/21/18   Thurman Coyer, DO  PROVENTIL HFA 108 (90 Base) MCG/ACT inhaler INHALE 2 PUFFS BY MOUTH EVERY 4 HOURS AS NEEDED OR SHORTNESS OF BREATH Patient taking differently: Inhale 2 puffs into the lungs every 4 (four) hours as needed for wheezing.  09/13/18   Bufford Lope, DO    Family History Family History  Problem Relation Age of Onset  . Heart disease Mother   . Heart disease Father   . Heart disease Sister   . Cancer Brother        type unknown  . Liver disease Maternal Grandmother   . Cancer Sister        type unknown  . Diabetes Sister   . Breast cancer Neg Hx     Social History Social History   Tobacco Use  . Smoking status: Former Smoker    Packs/day: 2.00    Years: 20.00    Pack years: 40.00    Types:  Cigarettes    Quit date: 09/17/2010    Years since quitting: 8.5  . Smokeless tobacco:  Never Used  Substance Use Topics  . Alcohol use: Yes    Comment: Socially drinks   . Drug use: No    Comment: tried cocaine 1 time 2010 only used 1 time     Allergies   Lisinopril   Review of Systems Review of Systems  Constitutional: Negative for activity change, chills and fever.  Respiratory: Positive for shortness of breath.   Cardiovascular: Negative for chest pain, palpitations and leg swelling.  Gastrointestinal: Positive for nausea and vomiting. Negative for abdominal pain and diarrhea.  Genitourinary: Negative for dysuria.  Musculoskeletal: Negative for back pain.  Skin: Negative for rash.  Allergic/Immunologic: Negative for immunocompromised state.  Neurological: Negative for syncope, weakness and headaches.  Psychiatric/Behavioral: Negative for confusion.   Physical Exam Updated Vital Signs BP (!) 161/84   Pulse 91   Temp 97.6 F (36.4 C) (Oral)   Resp (!) 27   SpO2 90%   Physical Exam Vitals signs and nursing note reviewed.  Constitutional:      General: She is not in acute distress.    Appearance: She is not ill-appearing or diaphoretic.  HENT:     Head: Normocephalic.  Eyes:     Conjunctiva/sclera: Conjunctivae normal.  Neck:     Musculoskeletal: Neck supple.     Comments: Stoma is intact and unremarkable.  No surrounding redness. Cardiovascular:     Rate and Rhythm: Normal rate and regular rhythm.     Heart sounds: No murmur. No friction rub. No gallop.   Pulmonary:     Effort: Pulmonary effort is normal. No respiratory distress.     Breath sounds: No stridor. No wheezing, rhonchi or rales.     Comments: No wheezing or other adventitious breath sounds.  No increased work of breathing. Chest:     Chest wall: No tenderness.  Abdominal:     General: There is no distension.     Palpations: Abdomen is soft. There is no mass.     Tenderness: There is no abdominal  tenderness. There is no right CVA tenderness, left CVA tenderness, guarding or rebound.     Hernia: No hernia is present.  Skin:    General: Skin is warm.     Findings: No rash.  Neurological:     Mental Status: She is alert.  Psychiatric:        Behavior: Behavior normal.      ED Treatments / Results  Labs (all labs ordered are listed, but only abnormal results are displayed) Labs Reviewed  BASIC METABOLIC PANEL - Abnormal; Notable for the following components:      Result Value   Glucose, Bld 126 (*)    Creatinine, Ser 1.23 (*)    GFR calc non Af Amer 47 (*)    GFR calc Af Amer 55 (*)    All other components within normal limits  SARS CORONAVIRUS 2 (HOSPITAL ORDER, [facility])  CBC    EKG None  Radiology Dg Chest 2 View  Result Date: 03/19/2019 CLINICAL DATA:  Swallowed foreign body trach fell down throat EXAM: CHEST - 2 VIEW COMPARISON:  11/17/2018 FINDINGS: The patient is rotated. There is suspected airspace disease at the left base. Stable cardiomediastinal silhouette. No pneumothorax. No tracheostomy tube is identified. Triangular opacity over the left mid chest medially. IMPRESSION: 1. No tracheostomy tube identified. 2. Triangular opacity projecting over the left mid chest medially, indeterminate for small foreign body or artifact. CT could be obtained for further evaluation if  clinically indicated 3. Airspace disease at the left base, may reflect atelectasis or pneumonia. Electronically Signed   By: Donavan Foil M.D.   On: 03/19/2019 01:58    Procedures .Critical Care Performed by: Joanne Gavel, PA-C Authorized by: Joanne Gavel, PA-C   Critical care provider statement:    Critical care time (minutes):  50   Critical care time was exclusive of:  Separately billable procedures and treating other patients and teaching time   Critical care was necessary to treat or prevent imminent or life-threatening deterioration of the following  conditions:  Respiratory failure   Critical care was time spent personally by me on the following activities:  Discussions with consultants, development of treatment plan with patient or surrogate, evaluation of patient's response to treatment, examination of patient, obtaining history from patient or surrogate, ordering and performing treatments and interventions, ordering and review of laboratory studies, ordering and review of radiographic studies, pulse oximetry, re-evaluation of patient's condition and review of old charts   (including critical care time)  Medications Ordered in ED Medications  ketamine 50 mg in normal saline 5 mL (10 mg/mL) syringe (0 mg Intravenous Hold 03/19/19 0452)  lidocaine (XYLOCAINE) 2 % (with pres) injection (has no administration in time range)  ketamine (KETALAR) 10 MG/ML injection (0 mg  Hold 03/19/19 0452)  fentaNYL (SUBLIMAZE) injection 100 mcg (has no administration in time range)  hydrALAZINE (APRESOLINE) tablet 25 mg (has no administration in time range)  Ampicillin-Sulbactam (UNASYN) 3 g in sodium chloride 0.9 % 100 mL IVPB (has no administration in time range)  Ampicillin-Sulbactam (UNASYN) 3 g in sodium chloride 0.9 % 100 mL IVPB (3 g Intravenous New Bag/Given 03/19/19 0201)  ondansetron (ZOFRAN) injection 4 mg (4 mg Intravenous Given 03/19/19 0159)  HYDROmorphone (DILAUDID) injection 0.5 mg (0.5 mg Intravenous Given 03/19/19 0308)     Initial Impression / Assessment and Plan / ED Course  I have reviewed the triage vital signs and the nursing notes.  Pertinent labs & imaging results that were available during my care of the patient were reviewed by me and considered in my medical decision making (see chart for details).  Clinical Course as of Mar 18 556  Sat Mar 19, 2019  0101 Spoke with remote ICU physician, Dr. Genevive Bi.  Critical care will come and evaluate the patient.   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  On arrival to the ER, she is hypoxic at 75% on room air.  The patient has a stoma secondary to laryngectomy.  She uses a TEP prosthesis device that aids in vocalization.  She has aspirated and swallowed this device multiple times previously.  The patient was seen and independently evaluated by Dr. Kathrynn Humble, attending physician.  She was placed on supplemental oxygen and oxygen saturation increased to greater than 90%.  I accompanied the patient to x-ray and a foreign body was not obviously seen on AP and lateral x-ray on my evaluation.  Chest x-ray impression with triangular opacity projecting over the left mid chest medially indeterminate for small foreign body or artifact.  There is also airway disease of the left lung base reflecting pneumonia or atelectasis.  Given multiple episodes of vomiting, will treat with Unasyn for  aspiration pneumonia.  She was given Zofran for nausea and vomiting with significant improvement.  Pain was well-controlled with fentanyl.  COVID-19 test is negative.  Labs are notable for a small AKI, but otherwise unremarkable.  Consulted pulmonary critical care and the patient was seen by Dr. Randell Patient who attempted bedside bronchoscopy.  The foreign body was visualized, but was unable to be retrieved.  Consulted ENT and spoke with [name].  He recommends medical admit and will see the patient in the ER to determine if flexible endoscopy versus bronchoscopy in the OR is appropriate for foreign body removal.  ENT consult appreciated.  Spoke with Dr. Blaine Hamper, hospitalist, who will admit the patient. The patient appears reasonably stabilized for admission considering the current resources, flow, and capabilities available in the ED at this time, and I doubt any  other [facility] requiring further screening and/or treatment in the ED prior to admission.   Final Clinical Impressions(s) / ED Diagnoses   Final diagnoses:  Aspiration of foreign body, initial encounter    ED Discharge Orders    None       Estefan Pattison A, PA-C 03/19/19 [place], Ankit, MD 03/19/19 2300

## 2019-03-19 NOTE — Procedures (Signed)
Bronchoscopy Procedure Note Deborah [name] [id number] 1955/10/07  Procedure: Bronchoscopy Indications: Diagnostic evaluation of the airways  Procedure Details Consent: Risks of procedure as well as the alternatives and risks of each were explained to the (patient/caregiver).  Consent for procedure obtained. Time Out: Verified patient identification, verified procedure, site/side was marked, verified correct patient position, special equipment/implants available, medications/allergies/relevent history reviewed, required imaging and test results available.  Time Out Performed  In preparation for procedure, patient was given 100% FiO2, bronchoscope lubricated and lidocaine given via stoma (2%, 54ml). Sedation: None  Airway entered and the following bronchi were examined: Right sided airways appear normal with minimal thick white mucous. Left upper lobe airway patent and appeared normal. Left lower lobe bronchus with foreign body identified. It appears to be a clear plastic tube. Attempted to try to advanced scope through tube to pull the tubing proximal/removal without success.  Procedures performed:  Bronchoscope removed.    Evaluation Hemodynamic Status: Transient hypertension not requiring treatment; O2 sats: transiently fell during during procedure with quick recovery of her oxygenation Patient's Current Condition: stable Specimens:  None Complications: No apparent complications Patient did tolerate procedure well.   Jacques Earthly 03/19/2019

## 2019-03-19 NOTE — Progress Notes (Signed)
Patient placed on monitor on arrival to Short Stay. NSR on monitor 91 SpO2 98% on trach collar. CRNA at bedside.

## 2019-03-19 NOTE — Discharge Instructions (Signed)
You will need to talk with [facility].  The prosthesis may not be the right size since you aspirate it with coughing.

## 2019-03-19 NOTE — Progress Notes (Signed)
Report called to [facility] 

## 2019-03-19 NOTE — ED Notes (Signed)
Pt tolerated bronchscopy well foreign body visualized but unable to remove with current equiptment.

## 2019-03-19 NOTE — ED Triage Notes (Signed)
Pt trach fell down her throat. Hx of same. She thinks that it is in her throat and not her lung. PA at bedside.

## 2019-03-20 ENCOUNTER — Encounter (HOSPITAL_COMMUNITY): Payer: Self-pay | Admitting: Otolaryngology

## 2019-03-21 DIAGNOSIS — Z448 Encounter for fitting and adjustment of other external prosthetic devices: Secondary | ICD-10-CM | POA: Diagnosis not present

## 2019-03-21 DIAGNOSIS — Z9002 Acquired absence of larynx: Secondary | ICD-10-CM | POA: Diagnosis not present

## 2019-03-21 DIAGNOSIS — Z79899 Other long term (current) drug therapy: Secondary | ICD-10-CM | POA: Diagnosis not present

## 2019-03-21 DIAGNOSIS — I10 Essential (primary) hypertension: Secondary | ICD-10-CM | POA: Diagnosis not present

## 2019-03-28 ENCOUNTER — Other Ambulatory Visit: Payer: Self-pay | Admitting: Sports Medicine

## 2019-03-30 ENCOUNTER — Other Ambulatory Visit: Payer: Self-pay

## 2019-03-31 ENCOUNTER — Other Ambulatory Visit: Payer: Self-pay

## 2019-03-31 ENCOUNTER — Ambulatory Visit
Admission: RE | Admit: 2019-03-31 | Discharge: 2019-03-31 | Disposition: A | Discharge status: Home / Self Care | Payer: Medicaid Other | Source: Ambulatory Visit | Attending: Adult Health | Admitting: Adult Health

## 2019-03-31 ENCOUNTER — Telehealth: Payer: Self-pay | Admitting: *Deleted

## 2019-03-31 ENCOUNTER — Ambulatory Visit (INDEPENDENT_AMBULATORY_CARE_PROVIDER_SITE_OTHER): Payer: Medicaid Other | Admitting: *Deleted

## 2019-03-31 DIAGNOSIS — Z13820 Encounter for screening for osteoporosis: Secondary | ICD-10-CM | POA: Diagnosis not present

## 2019-03-31 DIAGNOSIS — E2839 Other primary ovarian failure: Secondary | ICD-10-CM

## 2019-03-31 DIAGNOSIS — Z23 Encounter for immunization: Secondary | ICD-10-CM

## 2019-03-31 DIAGNOSIS — Z780 Asymptomatic menopausal state: Secondary | ICD-10-CM | POA: Diagnosis not present

## 2019-03-31 MED ORDER — PROVENTIL HFA 108 (90 BASE) MCG/ACT IN AERS: INHALATION_SPRAY | RESPIRATORY_TRACT | 2 refills | Status: DC

## 2019-03-31 NOTE — Telephone Encounter (Signed)
Pt in office for flu shot and wanted to get a refill on her inhalers. April Zimmerman Rumple, CMA

## 2019-04-05 ENCOUNTER — Telehealth: Payer: Self-pay

## 2019-04-05 NOTE — Telephone Encounter (Signed)
Attempted to call patient with BD results but there was no answer, VM did not state patient name.

## 2019-04-05 NOTE — Telephone Encounter (Signed)
-----   Message from Gardenia Phlegm, NP sent at 04/01/2019  8:47 AM EDT ----- Bone density normal, please notify patient ----- Message ----- From: Interface, Rad Results In Sent: 03/31/2019   2:31 PM EDT To: Gardenia Phlegm, NP

## 2019-04-12 ENCOUNTER — Other Ambulatory Visit: Payer: Self-pay | Admitting: *Deleted

## 2019-04-12 MED ORDER — DULERA 200-5 MCG/ACT IN AERO: 2.0000 | INHALATION_SPRAY | Freq: Two times a day (BID) | RESPIRATORY_TRACT | 3 refills | Status: DC

## 2019-04-27 DIAGNOSIS — Z8521 Personal history of malignant neoplasm of larynx: Secondary | ICD-10-CM | POA: Diagnosis not present

## 2019-04-27 DIAGNOSIS — Z448 Encounter for fitting and adjustment of other external prosthetic devices: Secondary | ICD-10-CM | POA: Diagnosis not present

## 2019-04-27 DIAGNOSIS — Z9002 Acquired absence of larynx: Secondary | ICD-10-CM | POA: Diagnosis not present

## 2019-04-29 DIAGNOSIS — Z930 Tracheostomy status: Secondary | ICD-10-CM | POA: Diagnosis not present

## 2019-04-29 DIAGNOSIS — C321 Malignant neoplasm of supraglottis: Secondary | ICD-10-CM | POA: Diagnosis not present

## 2019-04-29 DIAGNOSIS — C329 Malignant neoplasm of larynx, unspecified: Secondary | ICD-10-CM | POA: Diagnosis not present

## 2019-05-20 ENCOUNTER — Telehealth (INDEPENDENT_AMBULATORY_CARE_PROVIDER_SITE_OTHER): Payer: Medicaid Other | Admitting: Family Medicine

## 2019-05-20 ENCOUNTER — Other Ambulatory Visit: Payer: Self-pay

## 2019-05-20 DIAGNOSIS — J302 Other seasonal allergic rhinitis: Secondary | ICD-10-CM

## 2019-05-20 DIAGNOSIS — J309 Allergic rhinitis, unspecified: Secondary | ICD-10-CM | POA: Insufficient documentation

## 2019-05-20 MED ORDER — CETIRIZINE HCL 10 MG PO TABS: 10.0000 mg | ORAL_TABLET | Freq: Every day | ORAL | 0 refills | Status: DC

## 2019-05-20 MED ORDER — BENZONATATE 100 MG PO CAPS: 100.0000 mg | ORAL_CAPSULE | Freq: Two times a day (BID) | ORAL | 0 refills | Status: DC | PRN

## 2019-05-20 NOTE — Progress Notes (Signed)
[facility] Telemedicine Visit  Patient consented to have virtual visit. Method of visit: Video was attempted, but technology challenges prevented patient from using video, so visit was conducted via telephone.  Encounter participants: Patient: Deborah [name] - located at Home Provider: Patriciaann Clan - located at [facility] Others (if applicable): None   Chief Complaint: "seasonal cold"   HPI: Deborah [name] is a 63 year old female presenting discuss the following:  Cold-like symptoms: Coming on with the weather changes, occurs every year at the exact same time. Present for the past two weeks, 14 days.  Says that she will always get an antibiotic every year for this for it to go away.  Non-productive coughing with postnasal drip, runny nose/congestion.  Eyes somewhat watery.  No associated fever, sore throat, sinus pressure, wheezing, SOB, chest pain, change in taste/smell.  She has been using Nyquil/dayquil and drinking hot tea with honey however does not seem to help much.  Says she used to use Claritin however for she feels like it has not worked in the past several years for her anymore.  Says she cannot use any nasal inhalers due to previous surgeries in her sinuses.  No known COVID exposures, stays at home except for the grocery store, wears 2 masks at all times and washing her hands frequently because she knows she is high risk.  She is bothered by these symptoms, however able to maintain her regular daily routine without fatigue.  Lives with her husband, no one else at home is sick.  Taking her COPD inhalers as prescribed, has not needed to use them more frequently.  ROS: per HPI  Pertinent PMHx: Hypertension, CHF, larynx cancer s/p chemo/radiation and surgical resection, COPD, seizure disorder, hypothyroidism, depression, ductal carcinoma in situ of right breast  Exam:  Respiratory: Hoarse voice, however unlabored breathing and able to speak in full  sentences.  No coughing during conversation.  Assessment/Plan:  Allergic rhinitis 2-week history of rhinorrhea with associated postnasal drip cough that occurs on an annual basis suggestive of allergic rhinitis.  Due to cough, considered COPD exacerbation with known COPD, however symptomatology seems more suggestive above without productive sputum.  No fever or sputum to suggest concern for pneumonia.  Lastly, considered Covid infection given current pandemic, however patient has been very diligent about appropriate precautions and quarantine, have low concern for this as her symptoms are classic for her normal seasonal flare.  Discussed the likelihood that this is likely allergic vs viral and that I would prefer to reserve antibiotic therapy for more necessary cases especially as she has numerous co-morbidities.   -Through shared decision making, decided on trialing a different oral antihistamine and antitussive with continued conservative therapy including hot showers, tea with honey.  If she sees no benefit over the weekend, will send in an antibiotic on 12/7, patient to call for this. -Sent in Zyrtec 10 mg and Tessalon Perles to use as needed    Time spent during visit with patient: 12 minutes  Patriciaann Clan, [place] PGY-2

## 2019-05-20 NOTE — Assessment & Plan Note (Addendum)
2-week history of rhinorrhea with associated postnasal drip cough that occurs on an annual basis suggestive of allergic rhinitis.  Due to cough, considered COPD exacerbation with known COPD, however symptomatology seems more suggestive above without productive sputum.  No fever or sputum to suggest concern for pneumonia.  Lastly, considered Covid infection given current pandemic, however patient has been very diligent about appropriate precautions and quarantine, have low concern for this as her symptoms are classic for her normal seasonal flare.  Discussed the likelihood that this is likely allergic vs viral and that I would prefer to reserve antibiotic therapy for more necessary cases especially as she has numerous co-morbidities.   -Through shared decision making, decided on trialing a different oral antihistamine and antitussive with continued conservative therapy including hot showers, tea with honey.  If she sees no benefit over the weekend, will send in an antibiotic on 12/7, patient to call for this. -Sent in Zyrtec 10 mg and Tessalon Perles to use as needed

## 2019-05-23 ENCOUNTER — Ambulatory Visit
Admission: RE | Admit: 2019-05-23 | Discharge: 2019-05-23 | Disposition: A | Discharge status: Home / Self Care | Payer: Medicaid Other | Source: Ambulatory Visit | Attending: Adult Health | Admitting: Adult Health

## 2019-05-23 ENCOUNTER — Other Ambulatory Visit: Payer: Self-pay

## 2019-05-23 ENCOUNTER — Telehealth: Payer: Self-pay

## 2019-05-23 DIAGNOSIS — Z9002 Acquired absence of larynx: Secondary | ICD-10-CM | POA: Diagnosis not present

## 2019-05-23 DIAGNOSIS — I10 Essential (primary) hypertension: Secondary | ICD-10-CM | POA: Diagnosis not present

## 2019-05-23 DIAGNOSIS — Z4589 Encounter for adjustment and management of other implanted devices: Secondary | ICD-10-CM | POA: Diagnosis not present

## 2019-05-23 DIAGNOSIS — Z79899 Other long term (current) drug therapy: Secondary | ICD-10-CM | POA: Diagnosis not present

## 2019-05-23 DIAGNOSIS — R928 Other abnormal and inconclusive findings on diagnostic imaging of breast: Secondary | ICD-10-CM | POA: Diagnosis not present

## 2019-05-23 DIAGNOSIS — Z8521 Personal history of malignant neoplasm of larynx: Secondary | ICD-10-CM | POA: Diagnosis not present

## 2019-05-23 DIAGNOSIS — D0511 Intraductal carcinoma in situ of right breast: Secondary | ICD-10-CM

## 2019-05-23 NOTE — Telephone Encounter (Signed)
Patient LVM on nurse line stating the medication given to her last week is not working. Patient did not state any details or medication names. Per chart review, looks like she was given Zyrtec and Tessalon. I attempted to call her for more information, however had to leave a VM.

## 2019-06-01 DIAGNOSIS — C321 Malignant neoplasm of supraglottis: Secondary | ICD-10-CM | POA: Diagnosis not present

## 2019-06-01 DIAGNOSIS — Z930 Tracheostomy status: Secondary | ICD-10-CM | POA: Diagnosis not present

## 2019-06-01 DIAGNOSIS — C329 Malignant neoplasm of larynx, unspecified: Secondary | ICD-10-CM | POA: Diagnosis not present

## 2019-06-03 ENCOUNTER — Telehealth: Payer: Self-pay | Admitting: Adult Health

## 2019-06-03 NOTE — Telephone Encounter (Signed)
I left a message regarding reschedule °

## 2019-06-09 ENCOUNTER — Other Ambulatory Visit: Payer: Self-pay | Admitting: Family Medicine

## 2019-06-09 DIAGNOSIS — J302 Other seasonal allergic rhinitis: Secondary | ICD-10-CM

## 2019-06-13 ENCOUNTER — Other Ambulatory Visit: Payer: Self-pay

## 2019-06-13 DIAGNOSIS — J069 Acute upper respiratory infection, unspecified: Secondary | ICD-10-CM

## 2019-06-13 MED ORDER — ALBUTEROL SULFATE (2.5 MG/3ML) 0.083% IN NEBU: 2.5000 mg | INHALATION_SOLUTION | Freq: Four times a day (QID) | RESPIRATORY_TRACT | 1 refills | Status: DC | PRN

## 2019-06-16 DIAGNOSIS — Z8521 Personal history of malignant neoplasm of larynx: Secondary | ICD-10-CM | POA: Diagnosis not present

## 2019-06-20 ENCOUNTER — Ambulatory Visit: Payer: Medicaid Other | Admitting: Adult Health

## 2019-06-30 ENCOUNTER — Telehealth: Payer: Self-pay | Admitting: Adult Health

## 2019-06-30 NOTE — Telephone Encounter (Signed)
Returned patient's phone call regarding rescheduling 01/19 appointment, per patient's request appointment has moved 01/26.

## 2019-07-05 ENCOUNTER — Inpatient Hospital Stay: Payer: Medicaid Other | Admitting: Adult Health

## 2019-07-12 ENCOUNTER — Other Ambulatory Visit: Payer: Self-pay

## 2019-07-12 ENCOUNTER — Inpatient Hospital Stay: Payer: Medicaid Other | Attending: Adult Health | Admitting: Adult Health

## 2019-07-12 ENCOUNTER — Encounter: Payer: Self-pay | Admitting: Adult Health

## 2019-07-12 VITALS — BP 179/101 | HR 100 | Temp 98.3°F | Resp 17 | Ht 67.0 in | Wt 226.3 lb

## 2019-07-12 DIAGNOSIS — G8929 Other chronic pain: Secondary | ICD-10-CM | POA: Insufficient documentation

## 2019-07-12 DIAGNOSIS — M1990 Unspecified osteoarthritis, unspecified site: Secondary | ICD-10-CM | POA: Insufficient documentation

## 2019-07-12 DIAGNOSIS — E039 Hypothyroidism, unspecified: Secondary | ICD-10-CM | POA: Diagnosis not present

## 2019-07-12 DIAGNOSIS — Z809 Family history of malignant neoplasm, unspecified: Secondary | ICD-10-CM | POA: Insufficient documentation

## 2019-07-12 DIAGNOSIS — Z87891 Personal history of nicotine dependence: Secondary | ICD-10-CM | POA: Diagnosis not present

## 2019-07-12 DIAGNOSIS — Z888 Allergy status to other drugs, medicaments and biological substances status: Secondary | ICD-10-CM | POA: Insufficient documentation

## 2019-07-12 DIAGNOSIS — Z833 Family history of diabetes mellitus: Secondary | ICD-10-CM | POA: Insufficient documentation

## 2019-07-12 DIAGNOSIS — Z8719 Personal history of other diseases of the digestive system: Secondary | ICD-10-CM | POA: Insufficient documentation

## 2019-07-12 DIAGNOSIS — Z930 Tracheostomy status: Secondary | ICD-10-CM | POA: Insufficient documentation

## 2019-07-12 DIAGNOSIS — Z923 Personal history of irradiation: Secondary | ICD-10-CM | POA: Diagnosis not present

## 2019-07-12 DIAGNOSIS — Z79811 Long term (current) use of aromatase inhibitors: Secondary | ICD-10-CM | POA: Diagnosis not present

## 2019-07-12 DIAGNOSIS — Z8521 Personal history of malignant neoplasm of larynx: Secondary | ICD-10-CM | POA: Diagnosis not present

## 2019-07-12 DIAGNOSIS — Z8249 Family history of ischemic heart disease and other diseases of the circulatory system: Secondary | ICD-10-CM | POA: Diagnosis not present

## 2019-07-12 DIAGNOSIS — Z170 Estrogen receptor positive status [ER+]: Secondary | ICD-10-CM | POA: Diagnosis not present

## 2019-07-12 DIAGNOSIS — D0511 Intraductal carcinoma in situ of right breast: Secondary | ICD-10-CM | POA: Diagnosis not present

## 2019-07-12 NOTE — Assessment & Plan Note (Addendum)
07/05/2018: Right lumpectomy: No residual carcinoma identified, right medial margin excision: DCIS high-grade 1.5 cm margins negative, additional margins benign, ER 90%, PR 90%, Tis NX stage 0  Anastrozole daily began in 08/2018 Mammogram in 05/2019 normal Bone density 03/2019 normal  [place] continues on Anastrozole daily with good tolerance.  She will continue this.  She was recommended to keep up with her other screenings and PCP visits, as well as healthy diet and exercise.

## 2019-07-12 NOTE — Progress Notes (Signed)
[facility] Follow up:    Deborah Pike, MD [address] [place] [address]   DIAGNOSIS: Cancer Staging Ductal carcinoma in situ (DCIS) of right breast Staging form: Breast, AJCC 8th Edition - Clinical: Stage 0 (cTis (DCIS), cN0, cM0, ER+, PR+, HER2: Not Assessed) - Signed by Nicholas Lose, MD on 06/02/2018 - Pathologic: Stage 0 (pTis (DCIS), pN0, cM0, ER+, PR+) - Unsigned  History of head and neck cancer Staging form: Larynx - Supraglottis, AJCC 7th Edition - Clinical: Stage IVA (T4a, N2, M0) - Signed by Heath Lark, MD on 07/28/2013   SUMMARY OF ONCOLOGIC HISTORY: Oncology History  Ductal carcinoma in situ (DCIS) of right breast  05/26/2018 Initial Diagnosis   History of laryngeal cancer treated with chemoradiation, screening detected right breast calcifications medial right breast measuring 1.9 cm, biopsy revealed high-grade DCIS with comedonecrosis and calcifications ER 90%, PR 90%, Tis NX stage 0   06/02/2018 Cancer Staging   Staging form: Breast, AJCC 8th Edition - Clinical: Stage 0 (cTis (DCIS), cN0, cM0, ER+, PR+, HER2: Not Assessed) - Signed by Nicholas Lose, MD on 06/02/2018   07/06/2018 Surgery   Right lumpectomy: No residual carcinoma identified, right medial margin excision: DCIS high-grade 1.5 cm margins negative, additional margins benign, ER 90%, PR 90%, Tis NX stage 0   09/06/2018 -  Anti-estrogen oral therapy   Anastrozole 29m daily     CURRENT THERAPY: Anastrozole  INTERVAL HISTORY: Deborah [name] 64y.o. female returns for evaluation of her estrogen psoitive breast cancer.  She is doing well.  Her most recent mammogram was on 05/23/2019 that showed no evidence of malignancy and breast density B.  She also underwent bone density testing 03/31/2019 that showed normal bone density.  She continues on Anastrozole daily.   Patient Active Problem List   Diagnosis Date Noted  . Allergic rhinitis 05/20/2019  . GERD  (gastroesophageal reflux disease)   . COPD (chronic obstructive pulmonary disease) ([facility]   . Chronic diastolic (congestive) heart failure ([place]   . Foreign body aspiration   . Breast cancer ([place] 07/06/2018  . 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[facility] 04/08/2018  . Carpal tunnel syndrome 03/16/2018  . Obesity 08/24/2017  . Plantar fasciitis 06/06/2016  . Low back pain 05/21/2016  . Health care maintenance 12/29/2015  . Osteoarthritis of knees, bilateral 05/31/2015  . Dysphagia 08/16/2014  . Weight gain 08/05/2014  . DOE (dyspnea on exertion) 04/28/2014  . Frequent falls 12/23/2013  . Bilateral shoulder pain 04/07/2013  . Anemia in chronic illness 08/11/2012  . Status post trachelectomy 08/04/2012  . Chronic pain 01/20/2012  . History of head and neck cancer 09/26/2011  . Hx of radiation therapy   . Tracheitis 07/17/2011  . Seizure ([facility] 07/17/2011  . Hypothyroidism 03/19/2011  . Essential hypertension 03/19/2011  . Larynx cancer ([facility] 07/31/2010    is allergic to lisinopril.  MEDICAL HISTORY: Past Medical History:  Diagnosis Date  . Anemia   . Arthritis    in knees  . Asthma   . Breast cancer ([place] [facility] [place]   . Bronchitis   . COPD (chronic obstructive pulmonary disease) ([facility]   . Diverticulosis 11/15/2012    noted on screening colonoscopy   . Dyspnea    with COPD exerbation  . Esophageal stricture   . Former smoker 03/19/2011  . GERD (gastroesophageal reflux disease)   . Heart murmur    asymptomatic   .  History of laryngectomy   . History of radiation therapy 11/15/18- 12/06/18   Right Breast total dose 42.56 Gy in 16 fractions.   [name] Hx of radiation therapy 09/03/10 to 10/16/2010   supraglottic larynx  . Hypertension   . 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 Internal hemorrhoid 11/15/2012    small, noted on screening colonoscopy   . 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  . Leukocytopenia   . Nausea alone 07/28/2013  . Neck pain 01/21/2012  . Normal MRI 07/14/11   negative for mestasis   . Pneumonia 2012  . Sciatica   . Seizures ([place])    07/24/11 off Effexor w/o seizure  . Sepsis ([place]) 08/04/12  . Sinusitis, chronic 07/20/2011   Bilateral maxillary, identified on MRI of head 07/14/11.    . Tracheostomy dependent [facility])     SURGICAL HISTORY: Past Surgical History:  Procedure Laterality Date  . BREAST LUMPECTOMY Right 07/06/2018  . BREAST LUMPECTOMY WITH RADIOACTIVE SEED LOCALIZATION Right 07/06/2018   Procedure: RIGHT BREAST LUMPECTOMY WITH RADIOACTIVE SEED LOCALIZATION;  Surgeon: Rolm Bookbinder, MD;  Location: [place];  Service: General;  Laterality: Right;  . COLONOSCOPY N/A 11/15/2012   Procedure: COLONOSCOPY;  Surgeon: Lafayette Dragon, MD;  [facility];  Service: Endoscopy;  Laterality: N/A;  . DENTAL RESTORATION/EXTRACTION WITH X-RAY    . ESOPHAGOGASTRODUODENOSCOPY (EGD) WITH PROPOFOL N/A 08/12/2018   Procedure: ESOPHAGOGASTRODUODENOSCOPY (EGD) WITH PROPOFOL;  Surgeon: Doran Stabler, MD;  Location: [place];  Service: Gastroenterology;  Laterality: N/A;  . ESOPHAGOSCOPY  06/21/2012   Procedure: ESOPHAGOSCOPY;  Surgeon: [name], MD;  Location: [place];  Service: ENT;  Laterality: N/A;  . ESOPHAGOSCOPY WITH DILITATION N/A 09/21/2014   Procedure: ESOPHAGOSCOPY WITH DILITATION;  Surgeon: [name], MD;  Location: [place];  Service: ENT;  Laterality: N/A;  . ESOPHAGOSCOPY WITH DILITATION N/A 07/04/2016   Procedure: ESOPHAGOSCOPY WITH DILITATION;  Surgeon: [name], MD;  Location: [place];  Service: ENT;  Laterality: N/A;  . ESOPHAGOSCOPY WITH DILITATION N/A 12/01/2017   Procedure: ESOPHAGOSCOPY WITH DILITATION;  Surgeon: [name], MD;  Location: [place];  Service: ENT;  Laterality: N/A;  . ESOPHAGOSCOPY WITH DILITATION N/A 04/19/2018   Procedure: ESOPHAGOSCOPY WITH DILITATION;  Surgeon: [name], MD;   Location: [place];  Service: ENT;  Laterality: N/A;  . ESOPHAGOSCOPY WITH DILITATION N/A 03/07/2019   Procedure: Esophagoscopy with dilatation;  Surgeon: [name], MD;  Location: [place];  Service: ENT;  Laterality: N/A;  . FLEXIBLE BRONCHOSCOPY  01/08/2018      . FOREIGN BODY REMOVAL BRONCHIAL  10/02/2011   Procedure: REMOVAL FOREIGN BODY BRONCHIAL;  Surgeon: Ruby Cola, MD;  Location: [place];  Service: ENT;  Laterality: N/A;  . FOREIGN BODY REMOVAL BRONCHIAL N/A 01/08/2018   Procedure: REMOVAL FOREIGN BODY BRONCHIAL;  Surgeon: Jodi Marble, MD;  [facility];  Service: ENT;  Laterality: N/A;  . LARYNGECTOMY    . Porta cath removal    . PORTACATH PLACEMENT  09/17/10   Tip in cavoatrial junction  . RE-EXCISION OF BREAST CANCER,SUPERIOR MARGINS Right 07/29/2018   Procedure: RE-EXCISION OF RIGHT BREAST MEDIAL MARGINS;  Surgeon: Rolm Bookbinder, MD;  Location: [place];  Service: General;  Laterality: Right;  . RIGID ESOPHAGOSCOPY N/A 03/19/2019   Procedure: FLEXIBLE ESOPHAGOSCOPY;  Surgeon: Jodi Marble, MD;  Location: [place];  Service: ENT;  Laterality: N/A;  . STOMAPLASTY N/A 10/21/2016   Procedure:  STOMAPLASTY;  Surgeon: [name], MD;  Location: [place];  Service: ENT;  Laterality: N/A;  . TRACHEAL DILITATION  07/16/2011   Procedure: TRACHEAL DILITATION;  Surgeon: Beckie Salts, MD;  Location: [place];  Service: ENT;  Laterality: N/A;  dilation of tracheal stoma and replacement of stoma tube  . TUBAL LIGATION  1982    SOCIAL HISTORY: Social History   Socioeconomic History  . Marital status: Divorced    Spouse name: Not on file  . Number of children: 2  . Years of education: 61  . Highest education level: Not on file  Occupational History  . Occupation: part time    Comment: warehouse work  Tobacco Use  . Smoking status: Former Smoker    Packs/day: 2.00    Years: 20.00    Pack years: 40.00    Types: Cigarettes    Quit date: 09/17/2010    Years since  quitting: 8.8  . Smokeless tobacco: Never Used  Substance and Sexual Activity  . Alcohol use: Yes    Comment: Socially drinks   . Drug use: No    Comment: tried cocaine 1 time 2010 only used 1 time  . Sexual activity: Yes    Birth control/protection: Surgical  Other Topics Concern  . Not on file  Social History Narrative   Lives with boyfriend   Social Determinants of Health   Financial Resource Strain:   . Difficulty of Paying Living Expenses: Not on file  Food Insecurity:   . Worried About Charity fundraiser in the Last Year: Not on file  . Ran Out of Food in the Last Year: Not on file  Transportation Needs: No Transportation Needs  . Lack of Transportation (Medical): No  . Lack of Transportation (Non-Medical): No  Physical Activity:   . Days of Exercise per Week: Not on file  . Minutes of Exercise per Session: Not on file  Stress:   . Feeling of Stress : Not on file  Social Connections:   . Frequency of Communication with Friends and Family: Not on file  . Frequency of Social Gatherings with Friends and Family: Not on file  . Attends Religious Services: Not on file  . Active Member of Clubs or Organizations: Not on file  . Attends Archivist Meetings: Not on file  . Marital Status: Not on file  Intimate Partner Violence: Not At Risk  . Fear of Current or Ex-Partner: No  . Emotionally Abused: No  . Physically Abused: No  . Sexually Abused: No    FAMILY HISTORY: Family History  Problem Relation Age of Onset  . Heart disease Mother   . Heart disease Father   . Heart disease Sister   . Cancer Brother        type unknown  . Liver disease Maternal Grandmother   . Cancer Sister        type unknown  . Diabetes Sister   . Breast cancer Neg Hx     Review of Systems  Constitutional: Negative for appetite change, chills, fatigue, fever and unexpected weight change.  HENT:   Negative for hearing loss, lump/mass, sore throat and trouble swallowing.   Eyes:  Negative for eye problems and icterus.  Respiratory: Negative for chest tightness, cough and shortness of breath.   Cardiovascular: Negative for leg swelling and palpitations.  Gastrointestinal: Negative for abdominal distention, abdominal pain, constipation, diarrhea, nausea and vomiting.  Endocrine: Negative for hot flashes.  Musculoskeletal: Negative for arthralgias.  Skin: Negative for itching and rash.  Neurological: Negative for dizziness, extremity weakness, headaches and numbness.  Hematological: Negative for adenopathy. Does not bruise/bleed easily.  Psychiatric/Behavioral: Negative for depression. The patient is not nervous/anxious.       PHYSICAL EXAMINATION  ECOG PERFORMANCE STATUS: 0 - Asymptomatic  Vitals:   07/12/19 1418  BP: (!) 179/101  Pulse: 100  Resp: 17  Temp: 98.3 F (36.8 C)  SpO2: 95%    Physical Exam Constitutional:      General: She is not in acute distress.    Appearance: Normal appearance. She is not toxic-appearing.  HENT:     Head: Normocephalic and atraumatic.  Eyes:     General: No scleral icterus.    Pupils: Pupils are equal, round, and reactive to light.  Cardiovascular:     Rate and Rhythm: Normal rate and regular rhythm.     Pulses: Normal pulses.     Heart sounds: Normal heart sounds.  Pulmonary:     Effort: Pulmonary effort is normal.     Breath sounds: Normal breath sounds.  Abdominal:     General: Abdomen is flat. Bowel sounds are normal. There is no distension.     Palpations: Abdomen is soft.     Tenderness: There is no abdominal tenderness.  Lymphadenopathy:     Cervical: No cervical adenopathy.  Skin:    General: Skin is warm and dry.     Capillary Refill: Capillary refill takes less than 2 seconds.     Findings: No lesion or rash.  Neurological:     General: No focal deficit present.     Mental Status: She is alert and oriented to person, place, and time.  Psychiatric:        Mood and Affect: Mood normal.         Behavior: Behavior normal.     LABORATORY DATA:  CBC    Component Value Date/Time   WBC 5.0 03/19/2019 0135   RBC 4.21 03/19/2019 0135   HGB 12.4 03/19/2019 0135   HGB 11.7 (L) 06/02/2018 1208   HGB 11.9 08/20/2015 1200   HCT 39.3 03/19/2019 0135   HCT 37.3 08/20/2015 1200   PLT 387 03/19/2019 0135   PLT 359 06/02/2018 1208   PLT 305 08/20/2015 1200   MCV 93.3 03/19/2019 0135   MCV 91.8 08/20/2015 1200   MCH 29.5 03/19/2019 0135   MCHC 31.6 03/19/2019 0135   RDW 13.4 03/19/2019 0135   RDW 14.4 08/20/2015 1200   LYMPHSABS 1.8 06/02/2018 1208   LYMPHSABS 1.2 08/20/2015 1200   MONOABS 0.3 06/02/2018 1208   MONOABS 0.3 08/20/2015 1200   EOSABS 0.2 06/02/2018 1208   EOSABS 0.2 08/20/2015 1200   BASOSABS 0.1 06/02/2018 1208   BASOSABS 0.1 08/20/2015 1200    CMP     Component Value Date/Time   NA 140 03/19/2019 0135   NA 141 03/23/2018 1105   NA 143 08/20/2015 1200   K 4.2 03/19/2019 0135   K 4.1 08/20/2015 1200   CL 107 03/19/2019 0135   CL 105 11/23/2012 0807   CO2 23 03/19/2019 0135   CO2 25 08/20/2015 1200   GLUCOSE 126 (H) 03/19/2019 0135   GLUCOSE 86 08/20/2015 1200   GLUCOSE 83 11/23/2012 0807   BUN 22 03/19/2019 0135   BUN 24 03/23/2018 1105   BUN 21.3 08/20/2015 1200   CREATININE 1.23 (H) 03/19/2019 0135   CREATININE 1.07 (H) 06/02/2018 1208   CREATININE 1.1 08/20/2015 1200   CALCIUM  9.4 03/19/2019 0135   CALCIUM 9.9 08/20/2015 1200   PROT 7.9 06/02/2018 1208   PROT 8.8 (H) 08/20/2015 1200   ALBUMIN 3.8 06/02/2018 1208   ALBUMIN 3.9 08/20/2015 1200   AST 16 06/02/2018 1208   AST 18 08/20/2015 1200   ALT 17 06/02/2018 1208   ALT 12 08/20/2015 1200   ALKPHOS 86 06/02/2018 1208   ALKPHOS 90 08/20/2015 1200   BILITOT 0.2 (L) 06/02/2018 1208   BILITOT 0.34 08/20/2015 1200   GFRNONAA 47 (L) 03/19/2019 0135   GFRNONAA 56 (L) 06/02/2018 1208   GFRAA 55 (L) 03/19/2019 0135   GFRAA >60 06/02/2018 1208     ASSESSMENT and THERAPY PLAN:   Ductal  carcinoma in situ (DCIS) of right breast 07/05/2018: Right lumpectomy: No residual carcinoma identified, right medial margin excision: DCIS high-grade 1.5 cm margins negative, additional margins benign, ER 90%, PR 90%, Tis NX stage 0  Anastrozole daily began in 08/2018 Mammogram in 05/2019 normal Bone density 03/2019 normal  Felicha continues on Anastrozole daily with good tolerance.  She will continue this.  She was recommended to keep up with her other screenings and PCP visits, as well as healthy diet and exercise.      RTC 6 months for f/u with Dr. Lindi Adie.  She was recommended to continue with the appropriate pandemic precautions.    All questions were answered. The patient knows to call the clinic with any problems, questions or concerns. We can certainly see the patient much sooner if necessary.  Total time this encounter: 20 minutes This note was electronically signed. Scot Dock, NP 07/12/2019

## 2019-07-13 ENCOUNTER — Telehealth: Payer: Self-pay | Admitting: Hematology and Oncology

## 2019-07-13 NOTE — Telephone Encounter (Signed)
I left a message regarding schedule  

## 2019-07-19 ENCOUNTER — Other Ambulatory Visit: Payer: Self-pay | Admitting: Family Medicine

## 2019-07-19 ENCOUNTER — Other Ambulatory Visit: Payer: Self-pay | Admitting: Hematology and Oncology

## 2019-07-19 DIAGNOSIS — J302 Other seasonal allergic rhinitis: Secondary | ICD-10-CM

## 2019-07-20 ENCOUNTER — Other Ambulatory Visit: Payer: Self-pay

## 2019-07-20 ENCOUNTER — Encounter: Payer: Self-pay | Admitting: Family Medicine

## 2019-07-20 ENCOUNTER — Ambulatory Visit (INDEPENDENT_AMBULATORY_CARE_PROVIDER_SITE_OTHER): Payer: Medicaid Other | Admitting: Family Medicine

## 2019-07-20 DIAGNOSIS — I10 Essential (primary) hypertension: Secondary | ICD-10-CM | POA: Diagnosis not present

## 2019-07-20 DIAGNOSIS — R221 Localized swelling, mass and lump, neck: Secondary | ICD-10-CM | POA: Diagnosis not present

## 2019-07-20 MED ORDER — METOPROLOL TARTRATE 50 MG PO TABS: 50.0000 mg | ORAL_TABLET | Freq: Two times a day (BID) | ORAL | 2 refills | Status: DC

## 2019-07-20 NOTE — Assessment & Plan Note (Addendum)
She was found to be hypertensive up to 170/90 today in clinic.  She reports that she has not been taking her metoprolol since she ran out about 1 year ago.  She continues to take her losartan and amlodipine.  No evidence of fluid overload on exam.  Her metoprolol was refilled.  She was encouraged to follow-up in clinic with her PCP for further discussion of hypertension.

## 2019-07-20 NOTE — Progress Notes (Addendum)
   CHIEF COMPLAINT / HPI:  Sebaceous cyst Ms Deborah [name] has been experiencing a small bump on the back of her neck for the past 3 weeks.  This seemed to start off smaller and gradually grew over the course of 3 weeks it is now much more firm compared to when it started.  For the past week and a half, it has been causing her some itching.  She has some discomfort with bending of her neck or putting on close or holding her trach collar in place.  She has not noticed any drainage from the bump.  She otherwise feels well.  She denies any recent colds or any known scalp problem/infection.  She was seen by her oncologist on 1/26.  At that visit, she reports that her oncologist encouraged her to be assessed by her PCP and to consider having this lesion lanced.  PERTINENT  PMH / PSH: History of pharyngeal cancer, s/p tracheostomy  OBJECTIVE: BP (!) 170/90   Pulse 80   SpO2 93%    HEENT: A single 1.5 x 1.5 cm indurated lump noted in her left occiput.  No evidence of erythema or drainage.  No obvious central hair follicle.  No other occipital or cervical lymphadenopathy noted.  Oropharynx normal appearing.  See picture below. Report [id number] - MEDIA REPORT: MEDIA INFORMATION Print Group [phone] - Media: Media Footer Print Group [phone] - Media: Media Header Media Information Print Group [phone] - Media: Media Display   Print Group 5004 - Media: Media Routing History Print Group [id number] - Media: Key Information (Rich Text) Document Information  Photos    07/20/2019 15:06  Attached To:  Deborah [name]  Source Information  Matilde Haymaker, MD  [facility]     ASSESSMENT / PLAN:  Localized swelling, mass and lump, neck Differential includes sebaceous cyst, abscess, for alcohol, enlarged lymph node.  Low suspicion for lymphadenopathy related to cancer primarily because she reports having discussed this briefly with her oncologist who expressed a low level of concern.  Most likely  sebaceous cyst based on physical exam.  Discussed lancing versus excision of cyst with capsule.  She reported that she is interested in total excision of the cyst if possible.  She would like this done sooner rather than later.  She was set up with an appointment in the [facility] dermatology clinic on 2/4.  She was informed that it may be difficult for [place] to excise in the clinic and we may have to refer out based on the location of the lump.  Essential hypertension She was found to be hypertensive up to 170/90 today in clinic.  She reports that she has not been taking her metoprolol since she ran out about 1 year ago.  She continues to take her losartan and amlodipine.  No evidence of fluid overload on exam.  Her metoprolol was refilled.  She was encouraged to follow-up in clinic with her PCP for further discussion of hypertension.     Matilde Haymaker, MD Deborah [name]   solifenacin

## 2019-07-20 NOTE — Assessment & Plan Note (Signed)
Differential includes sebaceous cyst, abscess, for alcohol, enlarged lymph node.  Low suspicion for lymphadenopathy related to cancer primarily because she reports having discussed this briefly with her oncologist who expressed a low level of concern.  Most likely sebaceous cyst based on physical exam.  Discussed lancing versus excision of cyst with capsule.  She reported that she is interested in total excision of the cyst if possible.  She would like this done sooner rather than later.  She was set up with an appointment in the [facility] dermatology clinic on 2/4.  She was informed that it may be difficult for [place] to excise in the clinic and we may have to refer out based on the location of the lump.

## 2019-07-20 NOTE — Patient Instructions (Signed)
I think that you have a cyst in your neck.  It will be helpful to be seen in our dermatology clinic to see if we can excise it here or if it would be better to have you see dermatology.

## 2019-07-21 ENCOUNTER — Other Ambulatory Visit: Payer: Self-pay

## 2019-07-21 ENCOUNTER — Ambulatory Visit (INDEPENDENT_AMBULATORY_CARE_PROVIDER_SITE_OTHER): Payer: Medicaid Other | Admitting: Family Medicine

## 2019-07-21 VITALS — BP 122/75 | HR 75 | Wt 223.8 lb

## 2019-07-21 DIAGNOSIS — L720 Epidermal cyst: Secondary | ICD-10-CM | POA: Diagnosis not present

## 2019-07-21 NOTE — Patient Instructions (Signed)
Thank you for coming to see me today. It was a pleasure. Today we talked about:    You have one stitch in the back of your neck.  Keep your bandage on for 24 hours.  After 24 hours, you can take this off.  Wash the area with soap and water.  You do not have to put ointment on the area.  You can just cover with a bandage.  Keep the stitches in for 7 to 10 days.  Come back to have it removed.  If you have signs of infection including redness, discharge, pain, fever, you should be seen right away.  Please follow-up with [place] in 7-10 days for suture removal.  If you have any questions or concerns, please do not hesitate to call the office at [phone].  Best,   [place] Constable, DO

## 2019-07-21 NOTE — Progress Notes (Signed)
   CHIEF COMPLAINT / HPI:  1 Cyst on Back of Neck, Left Side Has been present for 3 weeks, first noticed it when she was in the shower when she felt the a bump.  Has been getting progressively larger.  Intermittently hurts, but not constant.  Has not had any drainage from the area.  No fevers or chills.  Has tried warm compresses, also tried neosporin with no improvement.  PERTINENT  PMH / PSH: Has trach collar in similar location, HTN, CHF, Hx Laryngeal cancer and breast cancer, S/P head and neck radiation    OBJECTIVE: BP 122/75   Pulse 75   Wt 223 lb 12.8 oz (101.5 kg)   SpO2 94%   BMI 35.05 kg/m    Physical Exam:  General: 64 y.o. female in NAD Neck: trach in place Lungs: breathing comfortably Skin: 1.5 cm by 1.5cm firm lump with mild erythema overlying the region, mild TTP, no other obvious cervical or occipital lymphdenopathy, see image    Procedure: PRE-OP DIAGNOSIS: Epidermal Cyst POST-OP DIAGNOSIS: Same  PROCEDURE: Minimal Excision Technique for Epidermal Cyst Removal Performing Physician: Arizona Constable, D.O. Supervising Physician (if applicable): Azzie Roup, M.D.   PROCEDURE:  Minimal Excision Technique   The area surrounding the skin lesion was prepared and draped in the usual sterile manner. A field block was performed used Lidocaine 1% with Epinephrine.  A 0.3cm incision was made overlying the cyst.  Hemostats were used to open the incision and pressure applied to surrounding area, expressing a small amount of blood tinged pus.  The wall of the cyst was removed in pieces with forceps and hemostats.  Hemostasis was assured. The patient tolerated the procedure well.   Closure:  1 suture   Followup: The patient tolerated the procedure well without complications.  Standard post-procedure care is explained and return precautions are given.   ASSESSMENT / PLAN:  Epidermal cyst of neck Risks and benefits of excision discussed.  Consent for procedure  obtained.  Minimal excision technique performed per procedure note above.  Patient tolerated procedure well.  1 suture placed, bandage placed over the area.  Advised to keep area covered for 24 hrs and then wash area with soap and water.  Will need sutures removed in 7-10 days.  Advised of signs of infection and to return to care if experiences redness, pus, increased pain, or fevers.     Cleophas Dunker, [place]

## 2019-07-21 NOTE — Assessment & Plan Note (Signed)
Risks and benefits of excision discussed.  Consent for procedure obtained.  Minimal excision technique performed per procedure note above.  Patient tolerated procedure well.  1 suture placed, bandage placed over the area.  Advised to keep area covered for 24 hrs and then wash area with soap and water.  Will need sutures removed in 7-10 days.  Advised of signs of infection and to return to care if experiences redness, pus, increased pain, or fevers.

## 2019-08-09 ENCOUNTER — Other Ambulatory Visit: Payer: Self-pay

## 2019-08-09 ENCOUNTER — Ambulatory Visit: Payer: Medicaid Other

## 2019-08-09 ENCOUNTER — Telehealth: Payer: Self-pay

## 2019-08-09 ENCOUNTER — Encounter: Payer: Self-pay | Admitting: Family Medicine

## 2019-08-09 ENCOUNTER — Ambulatory Visit (INDEPENDENT_AMBULATORY_CARE_PROVIDER_SITE_OTHER): Payer: Medicaid Other | Admitting: Family Medicine

## 2019-08-09 DIAGNOSIS — L720 Epidermal cyst: Secondary | ICD-10-CM | POA: Diagnosis not present

## 2019-08-09 DIAGNOSIS — I10 Essential (primary) hypertension: Secondary | ICD-10-CM

## 2019-08-09 DIAGNOSIS — J069 Acute upper respiratory infection, unspecified: Secondary | ICD-10-CM | POA: Diagnosis not present

## 2019-08-09 DIAGNOSIS — K219 Gastro-esophageal reflux disease without esophagitis: Secondary | ICD-10-CM

## 2019-08-09 DIAGNOSIS — E039 Hypothyroidism, unspecified: Secondary | ICD-10-CM

## 2019-08-09 DIAGNOSIS — J449 Chronic obstructive pulmonary disease, unspecified: Secondary | ICD-10-CM | POA: Diagnosis not present

## 2019-08-09 DIAGNOSIS — F325 Major depressive disorder, single episode, in full remission: Secondary | ICD-10-CM

## 2019-08-09 MED ORDER — MELOXICAM 15 MG PO TABS: 15.0000 mg | ORAL_TABLET | Freq: Every day | ORAL | 0 refills | Status: DC | PRN

## 2019-08-09 MED ORDER — LEVOTHYROXINE SODIUM 175 MCG PO TABS: ORAL_TABLET | ORAL | 0 refills | Status: DC

## 2019-08-09 MED ORDER — AMLODIPINE BESYLATE 10 MG PO TABS: 10.0000 mg | ORAL_TABLET | Freq: Every day | ORAL | 3 refills | Status: DC

## 2019-08-09 MED ORDER — CETIRIZINE HCL 10 MG PO TABS: 10.0000 mg | ORAL_TABLET | Freq: Every day | ORAL | 0 refills | Status: DC

## 2019-08-09 MED ORDER — BUPROPION HCL ER (XL) 150 MG PO TB24: 150.0000 mg | ORAL_TABLET | Freq: Every day | ORAL | 3 refills | Status: DC

## 2019-08-09 MED ORDER — OMEPRAZOLE 40 MG PO CPDR: 40.0000 mg | DELAYED_RELEASE_CAPSULE | Freq: Two times a day (BID) | ORAL | 0 refills | Status: DC

## 2019-08-09 MED ORDER — LOSARTAN POTASSIUM 100 MG PO TABS: 100.0000 mg | ORAL_TABLET | Freq: Every day | ORAL | 3 refills | Status: DC

## 2019-08-09 MED ORDER — ALBUTEROL SULFATE (2.5 MG/3ML) 0.083% IN NEBU: 2.5000 mg | INHALATION_SOLUTION | Freq: Four times a day (QID) | RESPIRATORY_TRACT | 1 refills | Status: DC | PRN

## 2019-08-09 NOTE — Patient Instructions (Signed)
It was nice to meet you today,   I have refilled your prescriptions. I will let you know the results of your labwork if it is abnormal.  Otherwise you can continue taking your medications as prescribed.    If you ever don't have mobic and you need something to control your pain, you can use over the counter Alleve (generic: naproxen).  But do not use both at the same time.    Please come back in 6 months for your next visit.    Have a great day,   Clemetine Marker, MD

## 2019-08-09 NOTE — Telephone Encounter (Signed)
[facility] calls nurse line regarding form for patient to receive home health services for trach care.   [facility] re faxed form to office and form placed in Dr. Richardson Landry box for signature.   To PCP  Talbot Grumbling, RN

## 2019-08-09 NOTE — Progress Notes (Signed)
    SUBJECTIVE:   CHIEF COMPLAINT / HPI:  Annual checkup  Patient states she has not come into the clinic in the past year because she is concerned about Covid.  Patient takes all of her medications that are listed in her chart.  Endorses daily compliance.  Denies any side effects from these medications.  She states that her clinic went over her medication list the last time she was here a few weeks ago for removal of a cyst on her neck.  Not currently having any pain in the area where the cyst was.  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  Patient takes her PPI twice a day.  She states that she misses even 1 of these doses she develops painful acid reflux symptoms.  PERTINENT  PMH / PSH: Breast cancer, larynx cancer  OBJECTIVE:   BP 104/60   Pulse 75   Ht 5\' 7"  (1.702 m)   Wt 224 lb 6 oz (101.8 kg)   SpO2 94%   BMI 35.14 kg/m   General: African-American woman, appears older than stated age.  Tracheostomy in place.  Patient able to speak by occluding the tracheostomy with her finger.  Voice is raspy and difficult to understand. HEENT: PERRLA.  False teeth.  No oral pharyngeal erythema. Neck: Anteriorly has tracheostomy.  This does not appear infected.  Posteriorly patient has a small incision from recent cyst removal that is well-healing and nonerythematous. CV: Regular rhythm, normal rate.  2+ pulses radial bilaterally. Lungs: Coarse upper airway sounds heard during inspiration and exhalation.  No increased work of breathing. MSK: Normal gait, 5/5 strength bilaterally. Neuro: Cranial nerves II through XII grossly intact.  ASSESSMENT/PLAN:   Essential hypertension Hypertensive during her visit a few weeks ago.  Endorse that she was out of her Lopressor at the time and it was refilled.  Today BP is 104/60.  States she does not need any Lopressor now. -Refilled amlodipine -Refilled losartan  GERD (gastroesophageal reflux disease) Patient unable to tolerate  less than twice daily dosing of 40 mg of Prilosec -Refill PPI  Hypothyroidism Patient states she is compliant with her Synthroid.  We will get TSH as it has not been collected over a year. -Follow-up TSH.  Adjust as needed.  Epidermal cyst of neck Sutures removed today.  Area does not appear infected and was not tender.  COPD (chronic obstructive pulmonary disease) (HCC) Coarse upper airway sounds likely related to her tracheostomy. -Refilled albuterol  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[place]) Stable on Wellbutrin.  Patient not endorsing any signs or symptoms of depression currently.  No suicidal ideation.  Appears happy during her encounter. -Refilled Wellbutrin    Benay Pike, MD [place]

## 2019-08-09 NOTE — Progress Notes (Signed)
Patient reports for suture removal from excision of cyst on neck on 07/21/2019.  Site is unremarkable prior to removal. Cleansed site with alcohol prior to removal. Removed one suture. Site unremarkable, no bleeding or drainage noted.   Jeannine Kitten evaluated site after removal. Instructions given to return to clinic for signs of infection. Patient verbalized understanding.   Talbot Grumbling, RN

## 2019-08-10 DIAGNOSIS — Z08 Encounter for follow-up examination after completed treatment for malignant neoplasm: Secondary | ICD-10-CM | POA: Diagnosis not present

## 2019-08-10 DIAGNOSIS — Z8521 Personal history of malignant neoplasm of larynx: Secondary | ICD-10-CM | POA: Diagnosis not present

## 2019-08-10 LAB — TSH: TSH: 62.6 u[IU]/mL — ABNORMAL HIGH (ref 0.450–4.500)

## 2019-08-12 ENCOUNTER — Other Ambulatory Visit: Payer: Self-pay | Admitting: Family Medicine

## 2019-08-13 NOTE — Assessment & Plan Note (Signed)
Coarse upper airway sounds likely related to her tracheostomy. -Refilled albuterol

## 2019-08-13 NOTE — Assessment & Plan Note (Addendum)
Hypertensive during her visit a few weeks ago.  Endorse that she was out of her Lopressor at the time and it was refilled.  Today BP is 104/60.  States she does not need any Lopressor now. -Refilled amlodipine -Refilled losartan

## 2019-08-13 NOTE — Assessment & Plan Note (Signed)
Patient unable to tolerate less than twice daily dosing of 40 mg of Prilosec -Refill PPI

## 2019-08-13 NOTE — Assessment & Plan Note (Signed)
Stable on Wellbutrin.  Patient not endorsing any signs or symptoms of depression currently.  No suicidal ideation.  Appears happy during her encounter. -Refilled Wellbutrin

## 2019-08-13 NOTE — Assessment & Plan Note (Signed)
Patient states she is compliant with her Synthroid.  We will get TSH as it has not been collected over a year. -Follow-up TSH.  Adjust as needed.

## 2019-08-13 NOTE — Assessment & Plan Note (Signed)
Sutures removed today.  Area does not appear infected and was not tender.

## 2019-08-14 ENCOUNTER — Other Ambulatory Visit: Payer: Self-pay | Admitting: Hematology and Oncology

## 2019-08-19 ENCOUNTER — Telehealth: Payer: Self-pay | Admitting: Family Medicine

## 2019-08-19 DIAGNOSIS — Z79899 Other long term (current) drug therapy: Secondary | ICD-10-CM | POA: Diagnosis not present

## 2019-08-19 DIAGNOSIS — Z8521 Personal history of malignant neoplasm of larynx: Secondary | ICD-10-CM | POA: Diagnosis not present

## 2019-08-19 DIAGNOSIS — Z08 Encounter for follow-up examination after completed treatment for malignant neoplasm: Secondary | ICD-10-CM | POA: Diagnosis not present

## 2019-08-19 NOTE — Telephone Encounter (Signed)
Called pt to ask about synthroid use (recent tsh of 62).  No answer, left voicemail. If patient calls back please ask her if she is taking this medication daily and if she is taking it 30 minutes before breakfast with a full glass of water without any other medications as she is supposed to.

## 2019-08-19 NOTE — Telephone Encounter (Signed)
Patient came into office and stated that she is taking synthroid correctly.

## 2019-08-22 DIAGNOSIS — Z9002 Acquired absence of larynx: Secondary | ICD-10-CM | POA: Diagnosis not present

## 2019-08-22 DIAGNOSIS — Z8521 Personal history of malignant neoplasm of larynx: Secondary | ICD-10-CM | POA: Diagnosis not present

## 2019-09-01 DIAGNOSIS — K222 Esophageal obstruction: Secondary | ICD-10-CM | POA: Diagnosis not present

## 2019-09-01 DIAGNOSIS — Z923 Personal history of irradiation: Secondary | ICD-10-CM | POA: Diagnosis not present

## 2019-09-06 ENCOUNTER — Other Ambulatory Visit (HOSPITAL_COMMUNITY)
Admission: RE | Admit: 2019-09-06 | Discharge: 2019-09-06 | Disposition: A | Discharge status: Home / Self Care | Payer: Medicaid Other | Source: Ambulatory Visit | Attending: Otolaryngology | Admitting: Otolaryngology

## 2019-09-06 ENCOUNTER — Encounter (HOSPITAL_BASED_OUTPATIENT_CLINIC_OR_DEPARTMENT_OTHER)
Admission: RE | Admit: 2019-09-06 | Discharge: 2019-09-06 | Disposition: A | Discharge status: Home / Self Care | Payer: Medicaid Other | Source: Ambulatory Visit | Attending: Otolaryngology | Admitting: Otolaryngology

## 2019-09-06 ENCOUNTER — Encounter (HOSPITAL_BASED_OUTPATIENT_CLINIC_OR_DEPARTMENT_OTHER): Payer: Self-pay | Admitting: Otolaryngology

## 2019-09-06 ENCOUNTER — Other Ambulatory Visit: Payer: Self-pay

## 2019-09-06 DIAGNOSIS — Z01812 Encounter for preprocedural laboratory examination: Secondary | ICD-10-CM | POA: Insufficient documentation

## 2019-09-06 DIAGNOSIS — Z7951 Long term (current) use of inhaled steroids: Secondary | ICD-10-CM | POA: Diagnosis not present

## 2019-09-06 DIAGNOSIS — R569 Unspecified convulsions: Secondary | ICD-10-CM | POA: Diagnosis not present

## 2019-09-06 DIAGNOSIS — Z20822 Contact with and (suspected) exposure to covid-19: Secondary | ICD-10-CM | POA: Diagnosis not present

## 2019-09-06 DIAGNOSIS — Z79899 Other long term (current) drug therapy: Secondary | ICD-10-CM | POA: Diagnosis not present

## 2019-09-06 DIAGNOSIS — K222 Esophageal obstruction: Secondary | ICD-10-CM | POA: Diagnosis not present

## 2019-09-06 DIAGNOSIS — E039 Hypothyroidism, unspecified: Secondary | ICD-10-CM | POA: Diagnosis not present

## 2019-09-06 DIAGNOSIS — I10 Essential (primary) hypertension: Secondary | ICD-10-CM | POA: Diagnosis not present

## 2019-09-06 DIAGNOSIS — J449 Chronic obstructive pulmonary disease, unspecified: Secondary | ICD-10-CM | POA: Diagnosis not present

## 2019-09-06 DIAGNOSIS — Z87891 Personal history of nicotine dependence: Secondary | ICD-10-CM | POA: Diagnosis not present

## 2019-09-06 DIAGNOSIS — Z79890 Hormone replacement therapy (postmenopausal): Secondary | ICD-10-CM | POA: Diagnosis not present

## 2019-09-06 LAB — BASIC METABOLIC PANEL
Anion gap: 10 (ref 5–15)
BUN: 17 mg/dL (ref 8–23)
CO2: 26 mmol/L (ref 22–32)
Calcium: 9.3 mg/dL (ref 8.9–10.3)
Chloride: 104 mmol/L (ref 98–111)
Creatinine, Ser: 0.88 mg/dL (ref 0.44–1.00)
GFR calc Af Amer: >60 mL/min (ref >60)
GFR calc non Af Amer: >60 mL/min (ref >60)
Glucose, Bld: 82 mg/dL (ref 70–99)
Potassium: 4.2 mmol/L (ref 3.5–5.1)
Sodium: 140 mmol/L (ref 135–145)

## 2019-09-06 LAB — SARS CORONAVIRUS 2 (TAT 6-24 HRS): SARS Coronavirus 2: NEGATIVE

## 2019-09-08 NOTE — H&P (Signed)
HPI:   Deborah [name] is a 64 y.o. female who presents as a return Patient.   Referring Provider: Self, A Referral  Chief complaint: Swallowing.  HPI: 10 years post laryngectomy and radiation therapy. Intermittent esophageal stricture requiring dilatation. Her last one was last March and about a week ago she started having some trouble again.  PMH/Meds/All/SocHx/FamHx/ROS:   Past Medical History:  Diagnosis Date  . Cancer (HCC)  . COPD (chronic obstructive pulmonary disease) (HCC)  . History of radiation therapy  . Hypertension  . Hypothyroidism  . Seizures (HCC)   Past Surgical History:  Procedure Laterality Date  . Esophagoscopy with dilation  . stomoplasty  . TRACHEOSTOMY   No family history of bleeding disorders, wound healing problems or difficulty with anesthesia.   Social History   Socioeconomic History  . Marital status: Divorced  Spouse name: Not on file  . Number of children: Not on file  . Years of education: Not on file  . Highest education level: Not on file  Occupational History  . Not on file  Tobacco Use  . Smoking status: Former Smoker  Packs/day: 0.00  Quit date: 2010  Years since quitting: 11.2  . Smokeless tobacco: Never Used  Substance and Sexual Activity  . Alcohol use: Yes  Comment: occassionally  . Drug use: Not on file  . Sexual activity: Not on file  Other Topics Concern  . Not on file  Social History Narrative  . Not on file   Social Determinants of Health   Financial Resource Strain:  . Difficulty of Paying Living Expenses:  Food Insecurity:  . Worried About Running Out of Food in the Last Year:  . Ran Out of Food in the Last Year:  Transportation Needs:  . Lack of Transportation (Medical):  . Lack of Transportation (Non-Medical):  Physical Activity:  . Days of Exercise per Week:  . Minutes of Exercise per Session:  Stress:  . Feeling of Stress :  Social Connections:  . Frequency of Communication with Friends and  Family:  . Frequency of Social Gatherings with Friends and Family:  . Attends Religious Services:  . Active Member of Clubs or Organizations:  . Attends Club or Organization Meetings:  . Marital Status:   Current Outpatient Medications:  . albuterol 90 mcg/actuation inhaler, INHALE 2 PUFFS INTO THE LUNGS EVERY 4 HOURS AS NEEDED FOR WHEEZING OR SHORTNESS OF BREATH., Disp: , Rfl:  . amLODIPine (NORVASC) 10 MG tablet, TAKE 1 TABLET BY MOUTH ONCE DAILY, Disp: , Rfl: 3 . anastrozole (ARIMIDEX) 1 mg tablet, Take 1 mg by mouth., Disp: , Rfl:  . benzonatate (TESSALON) 100 MG capsule, TAKE ONE CAPSULE BY MOUTH THREE TIMES DAILY AS NEEDED FOR COUGH, Disp: , Rfl:  . buPROPion XL (WELLBUTRIN XL) 150 MG 24 hr tablet, TAKE ONE TABLET BY MOUTH ONCE DAILY, Disp: , Rfl:  . cetirizine (ZYRTEC) 10 MG tablet, TAKE 1 TABLET BY MOUTH ONCE DAILY, Disp: , Rfl:  . cyclobenzaprine (FLEXERIL) 10 MG tablet, TAKE ONE TABLET BY MOUTH TWICE DAILY FOR MUSCLE SPASM, Disp: , Rfl:  . dextromethorphan-guaifenesin (MUCINEX DM) 30-600 mg per tablet, Take 1 tablet by mouth., Disp: , Rfl:  . gabapentin (NEURONTIN) 300 MG capsule, Take by mouth., Disp: , Rfl:  . levothyroxine (SYNTHROID) 200 MCG tablet, TAKE 1 TABLET BY MOUTH DAILY BEFORE BREAKFAST, Disp: , Rfl: 3 . loratadine (CLARITIN) 10 mg tablet, TAKE ONE TABLET BY MOUTH ONCE DAILY, Disp: , Rfl:  . losartan (COZAAR) 100 MG tablet, TAKE   ONE TABLET BY MOUTH DAILY, Disp: , Rfl:  . MELATONIN ORAL, Take 1 tablet by mouth., Disp: , Rfl:  . meloxicam (MOBIC) 7.5 MG tablet, Take 7.5 mg by mouth., Disp: , Rfl:  . metoclopramide HCl (REGLAN) 10 MG tablet, Take by mouth., Disp: , Rfl:  . metoPROLOL tartrate (LOPRESSOR) 25 MG tablet, TAKE ONE TABLET BY MOUTH TWICE DAILY, Disp: , Rfl:  . mometasone-formoterol (DULERA) 200-5 mcg/actuation HFAA inhaler, INHALE TWO PUFFS BY MOUTH TWICE DAILY, Disp: , Rfl:  . nystatin (MYCOSTATIN) 100,000 unit/gram cream, Apply topically., Disp: , Rfl:  .  omeprazole (PRILOSEC) 40 MG capsule, Take 30- 60 min before your first and last meals of the day, Disp: , Rfl:  . predniSONE (DELTASONE) 50 MG tablet, Take once daily with food., Disp: , Rfl:  . scopolamine (TRANSDERM-SCOP) 1 mg over 3 days patch, PLACE 1 PATCH ONTO THE SKIN EVERY 3 DAYS, Disp: , Rfl:  . tiotropium (SPIRIVA WITH HANDIHALER) 18 mcg inhalation capsule, PLACE ONE CAPSULE INTO INHALER AND INHALE DAILY., Disp: , Rfl:  . tiotropium (SPIRIVA) 18 mcg inhalation capsule, Place 18 mcg into inhaler and inh., Disp: , Rfl:  . furosemide (LASIX) 20 MG tablet, Take 20 mg by mouth., Disp: , Rfl:   A complete ROS was performed with pertinent positives/negatives noted in the HPI. The remainder of the ROS are negative.   Physical Exam:   BP 161/89  Pulse 86  Temp 97.1 F (36.2 C)  Ht 1.702 m (5' 7")  Wt 100.7 kg (222 lb)  BMI 34.77 kg/m   General: Healthy and alert, in no distress, breathing easily. Normal affect. In a pleasant mood. Head: Normocephalic, atraumatic. No masses, or scars. Eyes: Pupils are equal, and reactive to light. Vision is grossly intact. No spontaneous or gaze nystagmus. Ears: Ear canals are clear. Tympanic membranes are intact, with normal landmarks and the middle ears are clear and healthy. Hearing: Grossly normal. Nose: Nasal cavities are clear with healthy mucosa, no polyps or exudate. Airways are patent. Face: No masses or scars, facial nerve function is symmetric. Oral Cavity: No mucosal abnormalities are noted. Tongue with normal mobility.  Oropharynx: There are no mucosal masses identified. Tongue base appears normal and healthy. Larynx/Hypopharynx: Tracheal stoma in good condition with TEP in place and excellent phonation. Chest: Deferred Neck: No palpable masses, no cervical adenopathy, no thyroid nodules or enlargement. Neuro: Cranial nerves II-XII with normal function. Balance: Normal gate. Other findings: none.  Independent Review of Additional  Tests or Records:  none  Procedures:  none  Impression & Plans:  No evidence of recurrent disease. Esophageal stricture. Recommend schedule for esophagoscopy with dilatation.  

## 2019-09-09 ENCOUNTER — Ambulatory Visit (HOSPITAL_BASED_OUTPATIENT_CLINIC_OR_DEPARTMENT_OTHER)
Admission: RE | Admit: 2019-09-09 | Discharge: 2019-09-09 | Disposition: A | Discharge status: Home / Self Care | Payer: Medicaid Other | Attending: Otolaryngology | Admitting: Otolaryngology

## 2019-09-09 ENCOUNTER — Other Ambulatory Visit: Payer: Self-pay

## 2019-09-09 ENCOUNTER — Ambulatory Visit (HOSPITAL_BASED_OUTPATIENT_CLINIC_OR_DEPARTMENT_OTHER): Payer: Medicaid Other | Admitting: Anesthesiology

## 2019-09-09 ENCOUNTER — Encounter (HOSPITAL_BASED_OUTPATIENT_CLINIC_OR_DEPARTMENT_OTHER): Payer: Self-pay | Admitting: Otolaryngology

## 2019-09-09 ENCOUNTER — Encounter (HOSPITAL_BASED_OUTPATIENT_CLINIC_OR_DEPARTMENT_OTHER)
Admission: RE | Disposition: A | Discharge status: Home / Self Care | Payer: Self-pay | Source: Home / Self Care | Attending: Otolaryngology

## 2019-09-09 DIAGNOSIS — Z7951 Long term (current) use of inhaled steroids: Secondary | ICD-10-CM | POA: Insufficient documentation

## 2019-09-09 DIAGNOSIS — Z79899 Other long term (current) drug therapy: Secondary | ICD-10-CM | POA: Diagnosis not present

## 2019-09-09 DIAGNOSIS — D638 Anemia in other chronic diseases classified elsewhere: Secondary | ICD-10-CM | POA: Diagnosis not present

## 2019-09-09 DIAGNOSIS — J449 Chronic obstructive pulmonary disease, unspecified: Secondary | ICD-10-CM | POA: Insufficient documentation

## 2019-09-09 DIAGNOSIS — I10 Essential (primary) hypertension: Secondary | ICD-10-CM | POA: Diagnosis not present

## 2019-09-09 DIAGNOSIS — R569 Unspecified convulsions: Secondary | ICD-10-CM | POA: Insufficient documentation

## 2019-09-09 DIAGNOSIS — Z87891 Personal history of nicotine dependence: Secondary | ICD-10-CM | POA: Diagnosis not present

## 2019-09-09 DIAGNOSIS — K222 Esophageal obstruction: Secondary | ICD-10-CM | POA: Insufficient documentation

## 2019-09-09 DIAGNOSIS — K219 Gastro-esophageal reflux disease without esophagitis: Secondary | ICD-10-CM | POA: Diagnosis not present

## 2019-09-09 DIAGNOSIS — Z79890 Hormone replacement therapy (postmenopausal): Secondary | ICD-10-CM | POA: Diagnosis not present

## 2019-09-09 DIAGNOSIS — Z923 Personal history of irradiation: Secondary | ICD-10-CM | POA: Diagnosis not present

## 2019-09-09 DIAGNOSIS — E039 Hypothyroidism, unspecified: Secondary | ICD-10-CM | POA: Diagnosis not present

## 2019-09-09 HISTORY — PX: ESOPHAGEAL DILATION: SHX303

## 2019-09-09 SURGERY — DILATION, ESOPHAGUS
Anesthesia: General | Site: Throat

## 2019-09-09 MED ORDER — PROPOFOL 10 MG/ML IV BOLUS
INTRAVENOUS | Status: AC
  Filled 2019-09-09: qty 20

## 2019-09-09 MED ORDER — LIDOCAINE 2% (20 MG/ML) 5 ML SYRINGE
INTRAMUSCULAR | Status: AC
  Filled 2019-09-09: qty 5

## 2019-09-09 MED ORDER — ACETAMINOPHEN 500 MG PO TABS
ORAL_TABLET | ORAL | Status: AC
  Filled 2019-09-09: qty 2

## 2019-09-09 MED ORDER — OXYCODONE HCL 5 MG/5ML PO SOLN: 5.0000 mg | Freq: Once | ORAL | Status: DC | PRN

## 2019-09-09 MED ORDER — FENTANYL CITRATE (PF) 100 MCG/2ML IJ SOLN
INTRAMUSCULAR | Status: AC
  Filled 2019-09-09: qty 2

## 2019-09-09 MED ORDER — MIDAZOLAM HCL 2 MG/2ML IJ SOLN
INTRAMUSCULAR | Status: AC
  Filled 2019-09-09: qty 2

## 2019-09-09 MED ORDER — LIDOCAINE HCL (CARDIAC) PF 100 MG/5ML IV SOSY
PREFILLED_SYRINGE | INTRAVENOUS | Status: DC | PRN
  Administered 2019-09-09: 60 mg via INTRAVENOUS

## 2019-09-09 MED ORDER — ONDANSETRON HCL 4 MG/2ML IJ SOLN
INTRAMUSCULAR | Status: DC | PRN
  Administered 2019-09-09: 4 mg via INTRAVENOUS

## 2019-09-09 MED ORDER — DEXAMETHASONE SODIUM PHOSPHATE 10 MG/ML IJ SOLN
INTRAMUSCULAR | Status: AC
  Filled 2019-09-09: qty 1

## 2019-09-09 MED ORDER — DEXAMETHASONE SODIUM PHOSPHATE 10 MG/ML IJ SOLN
INTRAMUSCULAR | Status: DC | PRN
  Administered 2019-09-09: 5 mg via INTRAVENOUS

## 2019-09-09 MED ORDER — OXYCODONE HCL 5 MG PO TABS: 5.0000 mg | ORAL_TABLET | Freq: Once | ORAL | Status: DC | PRN

## 2019-09-09 MED ORDER — HYDROMORPHONE HCL 1 MG/ML IJ SOLN: 0.2500 mg | INTRAMUSCULAR | Status: DC | PRN

## 2019-09-09 MED ORDER — PROPOFOL 10 MG/ML IV BOLUS
INTRAVENOUS | Status: DC | PRN
  Administered 2019-09-09: 150 mg via INTRAVENOUS

## 2019-09-09 MED ORDER — PROMETHAZINE HCL 25 MG/ML IJ SOLN: 6.2500 mg | INTRAMUSCULAR | Status: DC | PRN

## 2019-09-09 MED ORDER — LACTATED RINGERS IV SOLN: INTRAVENOUS | Status: DC

## 2019-09-09 MED ORDER — MIDAZOLAM HCL 2 MG/2ML IJ SOLN
1.0000 mg | INTRAMUSCULAR | Status: DC | PRN
  Administered 2019-09-09: 2 mg via INTRAVENOUS

## 2019-09-09 MED ORDER — ACETAMINOPHEN 500 MG PO TABS
1000.0000 mg | ORAL_TABLET | Freq: Once | ORAL | Status: AC
  Administered 2019-09-09: 1000 mg via ORAL

## 2019-09-09 MED ORDER — ONDANSETRON HCL 4 MG/2ML IJ SOLN
INTRAMUSCULAR | Status: AC
  Filled 2019-09-09: qty 2

## 2019-09-09 MED ORDER — FENTANYL CITRATE (PF) 100 MCG/2ML IJ SOLN
50.0000 ug | INTRAMUSCULAR | Status: DC | PRN
  Administered 2019-09-09: 50 ug via INTRAVENOUS

## 2019-09-09 SURGICAL SUPPLY — 31 items
APPLICATOR COTTON TIP 6 STRL (MISCELLANEOUS) ×1 IMPLANT
APPLICATOR COTTON TIP 6IN STRL (MISCELLANEOUS) ×2
CANISTER SUCT 1200ML W/VALVE (MISCELLANEOUS) ×2 IMPLANT
CNTNR URN SCR LID CUP LEK RST (MISCELLANEOUS) ×1 IMPLANT
CONT SPEC 4OZ STRL OR WHT (MISCELLANEOUS) ×2
COVER WAND RF STERILE (DRAPES) IMPLANT
GAUZE SPONGE 4X4 12PLY STRL LF (GAUZE/BANDAGES/DRESSINGS) ×4 IMPLANT
GLOVE BIO SURGEON STRL SZ7 (GLOVE) ×2 IMPLANT
GLOVE BIOGEL PI IND STRL 7.0 (GLOVE) ×2 IMPLANT
GLOVE BIOGEL PI INDICATOR 7.0 (GLOVE) ×2
GLOVE ECLIPSE 7.0 STRL STRAW (GLOVE) ×2 IMPLANT
GLOVE ECLIPSE 7.5 STRL STRAW (GLOVE) ×2 IMPLANT
GOWN STRL REUS W/ TWL LRG LVL3 (GOWN DISPOSABLE) ×1 IMPLANT
GOWN STRL REUS W/ TWL XL LVL3 (GOWN DISPOSABLE) ×1 IMPLANT
GOWN STRL REUS W/TWL LRG LVL3 (GOWN DISPOSABLE) ×2
GOWN STRL REUS W/TWL XL LVL3 (GOWN DISPOSABLE) ×2
GUARD TEETH (MISCELLANEOUS) IMPLANT
IV CATH 18G SAFETY (IV SOLUTION) ×2 IMPLANT
NEEDLE HYPO 18GX1.5 BLUNT FILL (NEEDLE) IMPLANT
NEEDLE SPNL 22GX7 QUINCKE BK (NEEDLE) IMPLANT
NS IRRIG 1000ML POUR BTL (IV SOLUTION) ×2 IMPLANT
PACK BASIN DAY SURGERY FS (CUSTOM PROCEDURE TRAY) ×2 IMPLANT
PATTIES SURGICAL .5 X3 (DISPOSABLE) ×2 IMPLANT
SHEET MEDIUM DRAPE 40X70 STRL (DRAPES) ×2 IMPLANT
SLEEVE SCD COMPRESS KNEE MED (MISCELLANEOUS) ×2 IMPLANT
SURGILUBE 2OZ TUBE FLIPTOP (MISCELLANEOUS) ×2 IMPLANT
SYR 20ML LL LF (SYRINGE) ×2 IMPLANT
SYR 5ML LL (SYRINGE) IMPLANT
SYR CONTROL 10ML LL (SYRINGE) IMPLANT
TOWEL GREEN STERILE FF (TOWEL DISPOSABLE) ×2 IMPLANT
TUBE CONNECTING 20X1/4 (TUBING) ×2 IMPLANT

## 2019-09-09 NOTE — Anesthesia Procedure Notes (Signed)
Procedure Name: Intubation Date/Time: 09/09/2019 8:20 AM Performed by: Jonna Munro, CRNA Pre-anesthesia Checklist: Patient identified, Suction available, Patient being monitored and Timeout performed Patient Re-evaluated:Patient Re-evaluated prior to induction Oxygen Delivery Method: Circle system utilized Preoxygenation: Pre-oxygenation with 100% oxygen Induction Type: IV induction and Inhalational induction Tube size: 6.0 mm Number of attempts: 1 Placement Confirmation: positive ETCO2 and breath sounds checked- equal and bilateral Tube secured with: Tape Comments: Used stoma for intubation

## 2019-09-09 NOTE — Op Note (Signed)
OPERATIVE REPORT  DATE OF SURGERY: 09/09/2019  PATIENT:  Deborah [name],  64 y.o. female  PRE-OPERATIVE DIAGNOSIS:  Esophageal stricture  POST-OPERATIVE DIAGNOSIS:  Esophageal stricture  PROCEDURE:  Procedure(s): ESOPHAGEAL DILATION  SURGEON:  Beckie Salts, MD  ASSISTANTS: None  ANESTHESIA:   General   EBL: 0 ml  DRAINS: None  LOCAL MEDICATIONS USED:  None  SPECIMEN:  none  COUNTS:  Correct  PROCEDURE DETAILS: The patient was taken to the operating room and placed on the operating table in the supine position. Following induction of general endotracheal anesthesia, via the tracheal stoma, the table was turned and the patient was draped in standard fashion.  The rigid cervical esophagoscope was entered into the oral cavity used to evaluate the neopharynx.  Savory dilators were used starting at #18 dilating up to 21 [place].  I was unable to pass a 36 [place] dilator.  For the larger dilators I switched the esophagoscope to a laryngoscope.  No other pathology was identified.  The TEP was irrigated using a 18-gauge Angiocath and saline.  Patient was awakened extubated and transferred to recovery in stable condition.    PATIENT DISPOSITION:  To PACU, stable

## 2019-09-09 NOTE — Anesthesia Postprocedure Evaluation (Signed)
Anesthesia Post Note  Patient: Deborah [name]  Procedure(s) Performed: ESOPHAGEAL DILATION (N/A Throat)     Patient location during evaluation: PACU Anesthesia Type: General Level of consciousness: awake, oriented, awake and alert and patient cooperative Pain management: pain level controlled Vital Signs Assessment: post-procedure vital signs reviewed and stable Respiratory status: spontaneous breathing, respiratory function stable, nonlabored ventilation and patient connected to face mask oxygen Cardiovascular status: stable Postop Assessment: no apparent nausea or vomiting Anesthetic complications: no    Last Vitals:  Vitals:   09/09/19 0705  BP: (!) 155/83  Pulse: 65  Resp: 20  Temp: (!) 36.4 C  SpO2: 93%    Last Pain:  Vitals:   09/09/19 0705  TempSrc: Oral  PainSc: 0-No pain                 Deaunte Dente

## 2019-09-09 NOTE — Transfer of Care (Signed)
Immediate Anesthesia Transfer of Care Note  Patient: Deborah [name]  Procedure(s) Performed: ESOPHAGEAL DILATION (N/A Throat)  Patient Location: PACU  Anesthesia Type:General  Level of Consciousness: awake, alert , oriented and patient cooperative  Airway & Oxygen Therapy: Patient Spontanous Breathing and Patient connected to face mask oxygen  Post-op Assessment: Report given to RN, Post -op Vital signs reviewed and stable and Patient moving all extremities X 4  Post vital signs: Reviewed and stable  Last Vitals:  Vitals Value Taken Time  BP 148/75 09/09/19 0858  Temp    Pulse 78 09/09/19 0859  Resp 16 09/09/19 0900  SpO2 89 % 09/09/19 0859  Vitals shown include unvalidated device data.  Last Pain:  Vitals:   09/09/19 0705  TempSrc: Oral  PainSc: 0-No pain         Complications: No apparent anesthesia complications

## 2019-09-09 NOTE — Anesthesia Preprocedure Evaluation (Deleted)
Anesthesia Evaluation  Patient identified by MRN, date of birth, ID band Patient awake    Reviewed: Allergy & Precautions, NPO status , Patient's Chart, lab work & pertinent test results, reviewed documented beta blocker date and time   Airway Mallampati: II  TM Distance: >3 FB Neck ROM: Full   Comment: laryngectomy Dental  (+) Teeth Intact, Dental Advisory Given   Pulmonary asthma , COPD,  COPD inhaler, former smoker,  S/p laryngectomy, prior trach dependence   Pulmonary exam normal breath sounds clear to auscultation       Cardiovascular hypertension, Pt. on medications and Pt. on home beta blockers +CHF  Normal cardiovascular exam Rhythm:Regular Rate:Normal     Neuro/Psych Seizures -,     GI/Hepatic Neg liver ROS, GERD  Medicated and Controlled,Esophageal stricture   Endo/Other  Hypothyroidism Obesity BMI 35  Renal/GU negative Renal ROS     Musculoskeletal  (+) Arthritis ,   Abdominal Normal abdominal exam  (+)   Peds  Hematology  (+) anemia ,   Anesthesia Other Findings   Reproductive/Obstetrics                             Anesthesia Physical Anesthesia Plan  ASA: III  Anesthesia Plan: General   Post-op Pain Management:    Induction: Intravenous  PONV Risk Score and Plan: 3 and Ondansetron, Dexamethasone, Midazolam and Treatment may vary due to age or medical condition  Airway Management Planned: Oral ETT  Additional Equipment: None  Intra-op Plan:   Post-operative Plan: Extubation in OR  Informed Consent: I have reviewed the patients History and Physical, chart, labs and discussed the procedure including the risks, benefits and alternatives for the proposed anesthesia with the patient or authorized representative who has indicated his/her understanding and acceptance.     Dental advisory given  Plan Discussed with: CRNA  Anesthesia Plan Comments:          Anesthesia Quick Evaluation

## 2019-09-09 NOTE — Interval H&P Note (Signed)
History and Physical Interval Note:  09/09/2019 7:49 AM  Deborah [name]  has presented today for surgery, with the diagnosis of esophageal stricture.  The various methods of treatment have been discussed with the patient and family. After consideration of risks, benefits and other options for treatment, the patient has consented to  Procedure(s): ESOPHAGEAL DILATION (N/A) as a surgical intervention.  The patient's history has been reviewed, patient examined, no change in status, stable for surgery.  I have reviewed the patient's chart and labs.  Questions were answered to the patient's satisfaction.     [name]

## 2019-09-09 NOTE — Anesthesia Preprocedure Evaluation (Signed)
Anesthesia Evaluation  Patient identified by MRN, date of birth, ID band Patient awake    Reviewed: Allergy & Precautions, NPO status , Patient's Chart, lab work & pertinent test results, reviewed documented beta blocker date and time   History of Anesthesia Complications Negative for: history of anesthetic complications  Airway       Comment: Laryngectomy Dental   Pulmonary asthma , COPD, former smoker,    Pulmonary exam normal        Cardiovascular hypertension, Pt. on medications and Pt. on home beta blockers Normal cardiovascular exam     Neuro/Psych Seizures -,  PSYCHIATRIC DISORDERS Depression    GI/Hepatic Neg liver ROS, GERD  Medicated,  Endo/Other  Hypothyroidism   Renal/GU negative Renal ROS  negative genitourinary   Musculoskeletal  (+) Arthritis ,   Abdominal   Peds  Hematology negative hematology ROS (+)   Anesthesia Other Findings S/p total laryngectomy/XRT with esophageal stricture  Reproductive/Obstetrics                             Anesthesia Physical  Anesthesia Plan  ASA: III  Anesthesia Plan: General   Post-op Pain Management:    Induction: Intravenous  PONV Risk Score and Plan: 3 and Ondansetron, Dexamethasone, Treatment may vary due to age or medical condition and Midazolam  Airway Management Planned:   Additional Equipment: None  Intra-op Plan:   Post-operative Plan: Extubation in OR  Informed Consent: I have reviewed the patients History and Physical, chart, labs and discussed the procedure including the risks, benefits and alternatives for the proposed anesthesia with the patient or authorized representative who has indicated his/her understanding and acceptance.       Plan Discussed with:   Anesthesia Plan Comments: (GA with ETT via laryngectomy stoma)        Anesthesia Quick Evaluation

## 2019-09-09 NOTE — Discharge Instructions (Signed)
Resume diet as tolerated.  Post Anesthesia Home Care Instructions  Activity: Get plenty of rest for the remainder of the day. A responsible individual must stay with you for 24 hours following the procedure.  For the next 24 hours, DO NOT: -Drive a car -Paediatric nurse -Drink alcoholic beverages -Take any medication unless instructed by your physician -Make any legal decisions or sign important papers.  No tylenol today until after 1:30pm  Meals: Start with liquid foods such as gelatin or soup. Progress to regular foods as tolerated. Avoid greasy, spicy, heavy foods. If nausea and/or vomiting occur, drink only clear liquids until the nausea and/or vomiting subsides. Call your physician if vomiting continues.  Special Instructions/Symptoms: Your throat may feel dry or sore from the anesthesia or the breathing tube placed in your throat during surgery. If this causes discomfort, gargle with warm salt water. The discomfort should disappear within 24 hours.  If you had a scopolamine patch placed behind your ear for the management of post- operative nausea and/or vomiting:  1. The medication in the patch is effective for 72 hours, after which it should be removed.  Wrap patch in a tissue and discard in the trash. Wash hands thoroughly with soap and water. 2. You may remove the patch earlier than 72 hours if you experience unpleasant side effects which may include dry mouth, dizziness or visual disturbances. 3. Avoid touching the patch. Wash your hands with soap and water after contact with the patch.

## 2019-09-09 NOTE — Addendum Note (Signed)
Addendum  created 09/09/19 0954 by Jonna Munro, CRNA   Review and Sign - Signed

## 2019-09-09 NOTE — Anesthesia Postprocedure Evaluation (Signed)
Anesthesia Post Note  Patient: Deborah [name]  Procedure(s) Performed: ESOPHAGEAL DILATION (N/A Throat)     Patient location during evaluation: PACU Anesthesia Type: General Level of consciousness: awake and alert Pain management: pain level controlled Vital Signs Assessment: post-procedure vital signs reviewed and stable Respiratory status: spontaneous breathing, nonlabored ventilation and respiratory function stable Cardiovascular status: blood pressure returned to baseline and stable Postop Assessment: no apparent nausea or vomiting Anesthetic complications: no    Last Vitals:  Vitals:   09/09/19 0904 09/09/19 0915  BP:  119/82  Pulse: 79 77  Resp: (!) 22 (!) 21  Temp:    SpO2: 100% 92%    Last Pain:  Vitals:   09/09/19 0915  TempSrc:   PainSc: 0-No pain                 Lidia Collum

## 2019-09-12 ENCOUNTER — Encounter: Payer: Self-pay | Admitting: *Deleted

## 2019-09-16 ENCOUNTER — Other Ambulatory Visit: Payer: Self-pay | Admitting: Family Medicine

## 2019-09-16 DIAGNOSIS — E039 Hypothyroidism, unspecified: Secondary | ICD-10-CM

## 2019-09-16 DIAGNOSIS — J302 Other seasonal allergic rhinitis: Secondary | ICD-10-CM

## 2019-09-19 ENCOUNTER — Other Ambulatory Visit: Payer: Self-pay

## 2019-09-20 ENCOUNTER — Other Ambulatory Visit: Payer: Self-pay | Admitting: Family Medicine

## 2019-09-20 MED ORDER — PROVENTIL HFA 108 (90 BASE) MCG/ACT IN AERS: INHALATION_SPRAY | RESPIRATORY_TRACT | 2 refills | Status: DC

## 2019-09-20 MED ORDER — DULERA 200-5 MCG/ACT IN AERO: 2.0000 | INHALATION_SPRAY | Freq: Two times a day (BID) | RESPIRATORY_TRACT | 0 refills | Status: DC

## 2019-09-21 ENCOUNTER — Other Ambulatory Visit: Payer: Self-pay

## 2019-09-23 MED ORDER — PROVENTIL HFA 108 (90 BASE) MCG/ACT IN AERS: INHALATION_SPRAY | RESPIRATORY_TRACT | 2 refills | Status: DC

## 2019-09-27 DIAGNOSIS — Z8521 Personal history of malignant neoplasm of larynx: Secondary | ICD-10-CM | POA: Diagnosis not present

## 2019-09-27 DIAGNOSIS — K222 Esophageal obstruction: Secondary | ICD-10-CM | POA: Diagnosis not present

## 2019-10-04 ENCOUNTER — Other Ambulatory Visit (HOSPITAL_COMMUNITY)
Admission: RE | Admit: 2019-10-04 | Discharge: 2019-10-04 | Disposition: A | Discharge status: Home / Self Care | Payer: Medicaid Other | Source: Ambulatory Visit | Attending: Otolaryngology | Admitting: Otolaryngology

## 2019-10-04 ENCOUNTER — Other Ambulatory Visit: Payer: Self-pay

## 2019-10-04 ENCOUNTER — Encounter (HOSPITAL_BASED_OUTPATIENT_CLINIC_OR_DEPARTMENT_OTHER): Payer: Self-pay | Admitting: Otolaryngology

## 2019-10-04 DIAGNOSIS — Z01812 Encounter for preprocedural laboratory examination: Secondary | ICD-10-CM | POA: Diagnosis not present

## 2019-10-04 DIAGNOSIS — Z20822 Contact with and (suspected) exposure to covid-19: Secondary | ICD-10-CM | POA: Diagnosis not present

## 2019-10-04 LAB — SARS CORONAVIRUS 2 (TAT 6-24 HRS): SARS Coronavirus 2: NEGATIVE

## 2019-10-04 NOTE — H&P (Signed)
HPI:   Deborah [name] is a 64 y.o. female who presents as a return Patient.   Referring Provider: Self, A Referral  Chief complaint: Swallowing.  HPI: 10 years post laryngectomy and radiation therapy. Intermittent esophageal stricture requiring dilatation. Her last one was last March and about a week ago she started having some trouble again.  PMH/Meds/All/SocHx/FamHx/ROS:   Past Medical History:  Diagnosis Date  . Cancer (HCC)  . COPD (chronic obstructive pulmonary disease) (HCC)  . History of radiation therapy  . Hypertension  . Hypothyroidism  . Seizures (HCC)   Past Surgical History:  Procedure Laterality Date  . Esophagoscopy with dilation  . stomoplasty  . TRACHEOSTOMY   No family history of bleeding disorders, wound healing problems or difficulty with anesthesia.   Social History   Socioeconomic History  . Marital status: Divorced  Spouse name: Not on file  . Number of children: Not on file  . Years of education: Not on file  . Highest education level: Not on file  Occupational History  . Not on file  Tobacco Use  . Smoking status: Former Smoker  Packs/day: 0.00  Quit date: 2010  Years since quitting: 11.2  . Smokeless tobacco: Never Used  Substance and Sexual Activity  . Alcohol use: Yes  Comment: occassionally  . Drug use: Not on file  . Sexual activity: Not on file  Other Topics Concern  . Not on file  Social History Narrative  . Not on file   Social Determinants of Health   Financial Resource Strain:  . Difficulty of Paying Living Expenses:  Food Insecurity:  . Worried About Running Out of Food in the Last Year:  . Ran Out of Food in the Last Year:  Transportation Needs:  . Lack of Transportation (Medical):  . Lack of Transportation (Non-Medical):  Physical Activity:  . Days of Exercise per Week:  . Minutes of Exercise per Session:  Stress:  . Feeling of Stress :  Social Connections:  . Frequency of Communication with Friends and  Family:  . Frequency of Social Gatherings with Friends and Family:  . Attends Religious Services:  . Active Member of Clubs or Organizations:  . Attends Club or Organization Meetings:  . Marital Status:   Current Outpatient Medications:  . albuterol 90 mcg/actuation inhaler, INHALE 2 PUFFS INTO THE LUNGS EVERY 4 HOURS AS NEEDED FOR WHEEZING OR SHORTNESS OF BREATH., Disp: , Rfl:  . amLODIPine (NORVASC) 10 MG tablet, TAKE 1 TABLET BY MOUTH ONCE DAILY, Disp: , Rfl: 3 . anastrozole (ARIMIDEX) 1 mg tablet, Take 1 mg by mouth., Disp: , Rfl:  . benzonatate (TESSALON) 100 MG capsule, TAKE ONE CAPSULE BY MOUTH THREE TIMES DAILY AS NEEDED FOR COUGH, Disp: , Rfl:  . buPROPion XL (WELLBUTRIN XL) 150 MG 24 hr tablet, TAKE ONE TABLET BY MOUTH ONCE DAILY, Disp: , Rfl:  . cetirizine (ZYRTEC) 10 MG tablet, TAKE 1 TABLET BY MOUTH ONCE DAILY, Disp: , Rfl:  . cyclobenzaprine (FLEXERIL) 10 MG tablet, TAKE ONE TABLET BY MOUTH TWICE DAILY FOR MUSCLE SPASM, Disp: , Rfl:  . dextromethorphan-guaifenesin (MUCINEX DM) 30-600 mg per tablet, Take 1 tablet by mouth., Disp: , Rfl:  . gabapentin (NEURONTIN) 300 MG capsule, Take by mouth., Disp: , Rfl:  . levothyroxine (SYNTHROID) 200 MCG tablet, TAKE 1 TABLET BY MOUTH DAILY BEFORE BREAKFAST, Disp: , Rfl: 3 . loratadine (CLARITIN) 10 mg tablet, TAKE ONE TABLET BY MOUTH ONCE DAILY, Disp: , Rfl:  . losartan (COZAAR) 100 MG tablet, TAKE   ONE TABLET BY MOUTH DAILY, Disp: , Rfl:  . MELATONIN ORAL, Take 1 tablet by mouth., Disp: , Rfl:  . meloxicam (MOBIC) 7.5 MG tablet, Take 7.5 mg by mouth., Disp: , Rfl:  . metoclopramide HCl (REGLAN) 10 MG tablet, Take by mouth., Disp: , Rfl:  . metoPROLOL tartrate (LOPRESSOR) 25 MG tablet, TAKE ONE TABLET BY MOUTH TWICE DAILY, Disp: , Rfl:  . mometasone-formoterol (DULERA) 200-5 mcg/actuation HFAA inhaler, INHALE TWO PUFFS BY MOUTH TWICE DAILY, Disp: , Rfl:  . nystatin (MYCOSTATIN) 100,000 unit/gram cream, Apply topically., Disp: , Rfl:  .  omeprazole (PRILOSEC) 40 MG capsule, Take 30- 60 min before your first and last meals of the day, Disp: , Rfl:  . predniSONE (DELTASONE) 50 MG tablet, Take once daily with food., Disp: , Rfl:  . scopolamine (TRANSDERM-SCOP) 1 mg over 3 days patch, PLACE 1 PATCH ONTO THE SKIN EVERY 3 DAYS, Disp: , Rfl:  . tiotropium (SPIRIVA WITH HANDIHALER) 18 mcg inhalation capsule, PLACE ONE CAPSULE INTO INHALER AND INHALE DAILY., Disp: , Rfl:  . tiotropium (SPIRIVA) 18 mcg inhalation capsule, Place 18 mcg into inhaler and inh., Disp: , Rfl:  . furosemide (LASIX) 20 MG tablet, Take 20 mg by mouth., Disp: , Rfl:   A complete ROS was performed with pertinent positives/negatives noted in the HPI. The remainder of the ROS are negative.   Physical Exam:   BP 161/89  Pulse 86  Temp 97.1 F (36.2 C)  Ht 1.702 m (5' 7")  Wt 100.7 kg (222 lb)  BMI 34.77 kg/m   General: Healthy and alert, in no distress, breathing easily. Normal affect. In a pleasant mood. Head: Normocephalic, atraumatic. No masses, or scars. Eyes: Pupils are equal, and reactive to light. Vision is grossly intact. No spontaneous or gaze nystagmus. Ears: Ear canals are clear. Tympanic membranes are intact, with normal landmarks and the middle ears are clear and healthy. Hearing: Grossly normal. Nose: Nasal cavities are clear with healthy mucosa, no polyps or exudate. Airways are patent. Face: No masses or scars, facial nerve function is symmetric. Oral Cavity: No mucosal abnormalities are noted. Tongue with normal mobility.  Oropharynx: There are no mucosal masses identified. Tongue base appears normal and healthy. Larynx/Hypopharynx: Tracheal stoma in good condition with TEP in place and excellent phonation. Chest: Deferred Neck: No palpable masses, no cervical adenopathy, no thyroid nodules or enlargement. Neuro: Cranial nerves II-XII with normal function. Balance: Normal gate. Other findings: none.  Independent Review of Additional  Tests or Records:  none  Procedures:  none  Impression & Plans:  No evidence of recurrent disease. Esophageal stricture. Recommend schedule for esophagoscopy with dilatation.  

## 2019-10-05 ENCOUNTER — Ambulatory Visit (HOSPITAL_BASED_OUTPATIENT_CLINIC_OR_DEPARTMENT_OTHER)
Admission: RE | Admit: 2019-10-05 | Discharge: 2019-10-05 | Disposition: A | Discharge status: Home / Self Care | Payer: Medicaid Other | Attending: Otolaryngology | Admitting: Otolaryngology

## 2019-10-05 ENCOUNTER — Ambulatory Visit (HOSPITAL_BASED_OUTPATIENT_CLINIC_OR_DEPARTMENT_OTHER): Payer: Medicaid Other | Admitting: Anesthesiology

## 2019-10-05 ENCOUNTER — Encounter (HOSPITAL_BASED_OUTPATIENT_CLINIC_OR_DEPARTMENT_OTHER)
Admission: RE | Disposition: A | Discharge status: Home / Self Care | Payer: Self-pay | Source: Home / Self Care | Attending: Otolaryngology

## 2019-10-05 ENCOUNTER — Encounter (HOSPITAL_BASED_OUTPATIENT_CLINIC_OR_DEPARTMENT_OTHER): Payer: Self-pay | Admitting: Otolaryngology

## 2019-10-05 DIAGNOSIS — K222 Esophageal obstruction: Secondary | ICD-10-CM | POA: Insufficient documentation

## 2019-10-05 DIAGNOSIS — Z791 Long term (current) use of non-steroidal anti-inflammatories (NSAID): Secondary | ICD-10-CM | POA: Diagnosis not present

## 2019-10-05 DIAGNOSIS — E669 Obesity, unspecified: Secondary | ICD-10-CM | POA: Diagnosis not present

## 2019-10-05 DIAGNOSIS — Z859 Personal history of malignant neoplasm, unspecified: Secondary | ICD-10-CM | POA: Diagnosis not present

## 2019-10-05 DIAGNOSIS — Z9002 Acquired absence of larynx: Secondary | ICD-10-CM | POA: Insufficient documentation

## 2019-10-05 DIAGNOSIS — Z923 Personal history of irradiation: Secondary | ICD-10-CM | POA: Insufficient documentation

## 2019-10-05 DIAGNOSIS — M1990 Unspecified osteoarthritis, unspecified site: Secondary | ICD-10-CM | POA: Diagnosis not present

## 2019-10-05 DIAGNOSIS — Z87891 Personal history of nicotine dependence: Secondary | ICD-10-CM | POA: Insufficient documentation

## 2019-10-05 DIAGNOSIS — Z7951 Long term (current) use of inhaled steroids: Secondary | ICD-10-CM | POA: Insufficient documentation

## 2019-10-05 DIAGNOSIS — K219 Gastro-esophageal reflux disease without esophagitis: Secondary | ICD-10-CM | POA: Insufficient documentation

## 2019-10-05 DIAGNOSIS — E039 Hypothyroidism, unspecified: Secondary | ICD-10-CM | POA: Diagnosis not present

## 2019-10-05 DIAGNOSIS — Z7952 Long term (current) use of systemic steroids: Secondary | ICD-10-CM | POA: Insufficient documentation

## 2019-10-05 DIAGNOSIS — R569 Unspecified convulsions: Secondary | ICD-10-CM | POA: Insufficient documentation

## 2019-10-05 DIAGNOSIS — I10 Essential (primary) hypertension: Secondary | ICD-10-CM | POA: Insufficient documentation

## 2019-10-05 DIAGNOSIS — J449 Chronic obstructive pulmonary disease, unspecified: Secondary | ICD-10-CM | POA: Diagnosis not present

## 2019-10-05 DIAGNOSIS — Z6834 Body mass index (BMI) 34.0-34.9, adult: Secondary | ICD-10-CM | POA: Diagnosis not present

## 2019-10-05 DIAGNOSIS — Z79899 Other long term (current) drug therapy: Secondary | ICD-10-CM | POA: Diagnosis not present

## 2019-10-05 DIAGNOSIS — D638 Anemia in other chronic diseases classified elsewhere: Secondary | ICD-10-CM | POA: Diagnosis not present

## 2019-10-05 HISTORY — PX: ESOPHAGEAL DILATION: SHX303

## 2019-10-05 SURGERY — DILATION, ESOPHAGUS
Anesthesia: General | Site: Esophagus

## 2019-10-05 MED ORDER — MIDAZOLAM HCL 2 MG/2ML IJ SOLN
INTRAMUSCULAR | Status: AC
  Filled 2019-10-05: qty 2

## 2019-10-05 MED ORDER — DEXAMETHASONE SODIUM PHOSPHATE 10 MG/ML IJ SOLN
INTRAMUSCULAR | Status: AC
  Filled 2019-10-05: qty 1

## 2019-10-05 MED ORDER — PHENYLEPHRINE 40 MCG/ML (10ML) SYRINGE FOR IV PUSH (FOR BLOOD PRESSURE SUPPORT)
PREFILLED_SYRINGE | INTRAVENOUS | Status: DC | PRN
  Administered 2019-10-05: 80 ug via INTRAVENOUS

## 2019-10-05 MED ORDER — MIDAZOLAM HCL 2 MG/2ML IJ SOLN
INTRAMUSCULAR | Status: DC | PRN
  Administered 2019-10-05: 2 mg via INTRAVENOUS

## 2019-10-05 MED ORDER — LACTATED RINGERS IV SOLN: INTRAVENOUS | Status: DC

## 2019-10-05 MED ORDER — OXYCODONE HCL 5 MG/5ML PO SOLN: 5.0000 mg | Freq: Once | ORAL | Status: DC | PRN

## 2019-10-05 MED ORDER — HYDROMORPHONE HCL 1 MG/ML IJ SOLN: 0.2500 mg | INTRAMUSCULAR | Status: DC | PRN

## 2019-10-05 MED ORDER — MEPERIDINE HCL 25 MG/ML IJ SOLN: 6.2500 mg | INTRAMUSCULAR | Status: DC | PRN

## 2019-10-05 MED ORDER — PROPOFOL 10 MG/ML IV BOLUS
INTRAVENOUS | Status: AC
  Filled 2019-10-05: qty 20

## 2019-10-05 MED ORDER — FENTANYL CITRATE (PF) 100 MCG/2ML IJ SOLN
INTRAMUSCULAR | Status: AC
  Filled 2019-10-05: qty 2

## 2019-10-05 MED ORDER — EPINEPHRINE PF 1 MG/ML IJ SOLN
INTRAMUSCULAR | Status: DC | PRN
  Administered 2019-10-05: 1 mg

## 2019-10-05 MED ORDER — PROPOFOL 10 MG/ML IV BOLUS
INTRAVENOUS | Status: DC | PRN
  Administered 2019-10-05: 120 mg via INTRAVENOUS

## 2019-10-05 MED ORDER — DEXAMETHASONE SODIUM PHOSPHATE 10 MG/ML IJ SOLN
INTRAMUSCULAR | Status: DC | PRN
  Administered 2019-10-05: 5 mg via INTRAVENOUS

## 2019-10-05 MED ORDER — ROCURONIUM BROMIDE 10 MG/ML (PF) SYRINGE
PREFILLED_SYRINGE | INTRAVENOUS | Status: AC
  Filled 2019-10-05: qty 10

## 2019-10-05 MED ORDER — PROMETHAZINE HCL 25 MG/ML IJ SOLN: 6.2500 mg | INTRAMUSCULAR | Status: DC | PRN

## 2019-10-05 MED ORDER — ROCURONIUM BROMIDE 10 MG/ML (PF) SYRINGE
PREFILLED_SYRINGE | INTRAVENOUS | Status: DC | PRN
  Administered 2019-10-05: 100 mg via INTRAVENOUS

## 2019-10-05 MED ORDER — SUCCINYLCHOLINE CHLORIDE 200 MG/10ML IV SOSY
PREFILLED_SYRINGE | INTRAVENOUS | Status: AC
  Filled 2019-10-05: qty 10

## 2019-10-05 MED ORDER — FENTANYL CITRATE (PF) 100 MCG/2ML IJ SOLN
INTRAMUSCULAR | Status: DC | PRN
  Administered 2019-10-05: 100 ug via INTRAVENOUS

## 2019-10-05 MED ORDER — LIDOCAINE 2% (20 MG/ML) 5 ML SYRINGE
INTRAMUSCULAR | Status: AC
  Filled 2019-10-05: qty 5

## 2019-10-05 MED ORDER — ONDANSETRON HCL 4 MG/2ML IJ SOLN
INTRAMUSCULAR | Status: DC | PRN
  Administered 2019-10-05: 4 mg via INTRAVENOUS

## 2019-10-05 MED ORDER — EPINEPHRINE PF 1 MG/ML IJ SOLN
INTRAMUSCULAR | Status: AC
  Filled 2019-10-05: qty 1

## 2019-10-05 MED ORDER — LIDOCAINE HCL (CARDIAC) PF 100 MG/5ML IV SOSY
PREFILLED_SYRINGE | INTRAVENOUS | Status: DC | PRN
  Administered 2019-10-05: 100 mg via INTRAVENOUS

## 2019-10-05 MED ORDER — SUGAMMADEX SODIUM 200 MG/2ML IV SOLN
INTRAVENOUS | Status: DC | PRN
  Administered 2019-10-05: 400 mg via INTRAVENOUS

## 2019-10-05 MED ORDER — OXYCODONE HCL 5 MG PO TABS: 5.0000 mg | ORAL_TABLET | Freq: Once | ORAL | Status: DC | PRN

## 2019-10-05 MED ORDER — ONDANSETRON HCL 4 MG/2ML IJ SOLN
INTRAMUSCULAR | Status: AC
  Filled 2019-10-05: qty 2

## 2019-10-05 SURGICAL SUPPLY — 23 items
CANISTER SUCT 1200ML W/VALVE (MISCELLANEOUS) ×2 IMPLANT
CNTNR URN SCR LID CUP LEK RST (MISCELLANEOUS) IMPLANT
CONT SPEC 4OZ STRL OR WHT (MISCELLANEOUS)
COVER WAND RF STERILE (DRAPES) IMPLANT
GAUZE SPONGE 4X4 12PLY STRL LF (GAUZE/BANDAGES/DRESSINGS) ×4 IMPLANT
GLOVE ECLIPSE 7.5 STRL STRAW (GLOVE) ×2 IMPLANT
GOWN STRL REUS W/ TWL LRG LVL3 (GOWN DISPOSABLE) IMPLANT
GOWN STRL REUS W/ TWL XL LVL3 (GOWN DISPOSABLE) IMPLANT
GOWN STRL REUS W/TWL LRG LVL3 (GOWN DISPOSABLE)
GOWN STRL REUS W/TWL XL LVL3 (GOWN DISPOSABLE)
GUARD TEETH (MISCELLANEOUS) IMPLANT
NEEDLE HYPO 18GX1.5 BLUNT FILL (NEEDLE) ×2 IMPLANT
NEEDLE SPNL 22GX7 QUINCKE BK (NEEDLE) IMPLANT
NS IRRIG 1000ML POUR BTL (IV SOLUTION) ×2 IMPLANT
PACK BASIN DAY SURGERY FS (CUSTOM PROCEDURE TRAY) ×2 IMPLANT
PATTIES SURGICAL .5 X3 (DISPOSABLE) ×2 IMPLANT
SHEET MEDIUM DRAPE 40X70 STRL (DRAPES) ×2 IMPLANT
SURGILUBE 2OZ TUBE FLIPTOP (MISCELLANEOUS) ×2 IMPLANT
SYR 5ML LL (SYRINGE) ×2 IMPLANT
SYR CONTROL 10ML LL (SYRINGE) IMPLANT
TOWEL GREEN STERILE FF (TOWEL DISPOSABLE) ×2 IMPLANT
TUBE CONNECTING 20X1/4 (TUBING) ×2 IMPLANT
YANKAUER SUCT BULB TIP NO VENT (SUCTIONS) ×2 IMPLANT

## 2019-10-05 NOTE — Discharge Instructions (Signed)
Advance diet as tolerated.  Call if needed.   Post Anesthesia Home Care Instructions  Activity: Get plenty of rest for the remainder of the day. A responsible individual must stay with you for 24 hours following the procedure.  For the next 24 hours, DO NOT: -Drive a car -Paediatric nurse -Drink alcoholic beverages -Take any medication unless instructed by your physician -Make any legal decisions or sign important papers.  Meals: Start with liquid foods such as gelatin or soup. Progress to regular foods as tolerated. Avoid greasy, spicy, heavy foods. If nausea and/or vomiting occur, drink only clear liquids until the nausea and/or vomiting subsides. Call your physician if vomiting continues.  Special Instructions/Symptoms: Your throat may feel dry or sore from the anesthesia or the breathing tube placed in your throat during surgery. If this causes discomfort, gargle with warm salt water. The discomfort should disappear within 24 hours.  Call your surgeon if you experience:   1.  Fever over 101.0. 2.  Inability to urinate. 3.  Nausea and/or vomiting. 4.  Extreme swelling or bruising at the surgical site. 5.  Continued bleeding from the incision. 6.  Increased pain, redness or drainage from the incision. 7.  Problems related to your pain medication. 8.  Any problems and/or concerns

## 2019-10-05 NOTE — Op Note (Signed)
OPERATIVE REPORT  DATE OF SURGERY: 10/05/2019  PATIENT:  Deborah [name],  64 y.o. female  PRE-OPERATIVE DIAGNOSIS:  esophageal stricture  POST-OPERATIVE DIAGNOSIS:  esophageal stricture  PROCEDURE:  Procedure(s): ESOPHAGEAL DILATION  SURGEON:  Beckie Salts, MD  ASSISTANTS: None  ANESTHESIA:   General   EBL: 0 ml  DRAINS: None  LOCAL MEDICATIONS USED:  None  SPECIMEN:  none  COUNTS:  Correct  PROCEDURE DETAILS: The patient was taken to the operating room and placed on the operating table in the supine position. Following induction of general endotracheal anesthesia, via the tracheal stoma, the table was turned 90 degrees and the patient was draped in a standard fashion.  A Jackson sliding laryngoscope was used to visualize the pharynx and suspended to the [employer].  Serial dilation was accomplished starting at 75 French and serially dilating up to 41 [place] with the esophageal dilators.  Scope was removed.  Patient was awakened extubated and transferred to recovery in stable condition.    PATIENT DISPOSITION:  To PACU, stable

## 2019-10-05 NOTE — Transfer of Care (Signed)
Immediate Anesthesia Transfer of Care Note  Patient: Deborah [name]  Procedure(s) Performed: ESOPHAGEAL DILATION (N/A Esophagus)  Patient Location: PACU  Anesthesia Type:General  Level of Consciousness: awake, alert , oriented and patient cooperative  Airway & Oxygen Therapy: Patient Spontanous Breathing and Patient connected to tracheostomy mask oxygen  Post-op Assessment: Report given to RN and Post -op Vital signs reviewed and stable  Post vital signs: Reviewed and stable  Last Vitals:  Vitals Value Taken Time  BP 155/79 10/05/19 0900  Temp    Pulse 77 10/05/19 0905  Resp 21 10/05/19 0905  SpO2 98 % 10/05/19 0905  Vitals shown include unvalidated device data.  Last Pain:  Vitals:   10/05/19 0730  TempSrc: Tympanic  PainSc: 0-No pain         Complications: No apparent anesthesia complications

## 2019-10-05 NOTE — Anesthesia Preprocedure Evaluation (Signed)
Anesthesia Evaluation  Patient identified by MRN, date of birth, ID band Patient awake    Reviewed: Allergy & Precautions, NPO status , Patient's Chart, lab work & pertinent test results, reviewed documented beta blocker date and time   History of Anesthesia Complications Negative for: history of anesthetic complications  Airway Mallampati: II  TM Distance: >3 FB Neck ROM: Full   Comment: Laryngectomy Dental no notable dental hx.    Pulmonary asthma , COPD, former smoker,    Pulmonary exam normal breath sounds clear to auscultation       Cardiovascular hypertension, Pt. on medications and Pt. on home beta blockers Normal cardiovascular exam Rhythm:Regular Rate:Normal     Neuro/Psych Seizures -,  PSYCHIATRIC DISORDERS Depression    GI/Hepatic Neg liver ROS, GERD  Medicated,  Endo/Other  Hypothyroidism   Renal/GU negative Renal ROS  negative genitourinary   Musculoskeletal  (+) Arthritis ,   Abdominal (+) + obese,   Peds  Hematology negative hematology ROS (+)   Anesthesia Other Findings S/p total laryngectomy/XRT with esophageal stricture  Reproductive/Obstetrics                             Anesthesia Physical  Anesthesia Plan  ASA: III  Anesthesia Plan: General   Post-op Pain Management:    Induction: Intravenous  PONV Risk Score and Plan: 3 and Ondansetron, Dexamethasone, Treatment may vary due to age or medical condition and Midazolam  Airway Management Planned: Oral ETT  Additional Equipment: None  Intra-op Plan:   Post-operative Plan: Extubation in OR  Informed Consent: I have reviewed the patients History and Physical, chart, labs and discussed the procedure including the risks, benefits and alternatives for the proposed anesthesia with the patient or authorized representative who has indicated his/her understanding and acceptance.       Plan Discussed with:    Anesthesia Plan Comments: (GA with ETT)        Anesthesia Quick Evaluation

## 2019-10-05 NOTE — Interval H&P Note (Signed)
History and Physical Interval Note:  10/05/2019 8:21 AM  Deborah [name]  has presented today for surgery, with the diagnosis of esophageal stricture.  The various methods of treatment have been discussed with the patient and family. After consideration of risks, benefits and other options for treatment, the patient has consented to  Procedure(s): ESOPHAGEAL DILATION (N/A) as a surgical intervention.  The patient's history has been reviewed, patient examined, no change in status, stable for surgery.  I have reviewed the patient's chart and labs.  Questions were answered to the patient's satisfaction.     [name]

## 2019-10-05 NOTE — Anesthesia Procedure Notes (Signed)
Procedure Name: Intubation Date/Time: 10/05/2019 8:34 AM Performed by: Raenette Rover, CRNA Pre-anesthesia Checklist: Patient identified, Emergency Drugs available, Suction available and Patient being monitored Patient Re-evaluated:Patient Re-evaluated prior to induction Oxygen Delivery Method: Circle system utilized Preoxygenation: Pre-oxygenation with 100% oxygen Induction Type: IV induction Tube size: 6.0 mm Number of attempts: 1 Placement Confirmation: positive ETCO2 and breath sounds checked- equal and bilateral Secured at: 12 cm Tube secured with: Tape Dental Injury: Teeth and Oropharynx as per pre-operative assessment

## 2019-10-06 ENCOUNTER — Encounter: Payer: Self-pay | Admitting: *Deleted

## 2019-10-06 NOTE — Anesthesia Postprocedure Evaluation (Signed)
Anesthesia Post Note  Patient: Deborah [name]  Procedure(s) Performed: ESOPHAGEAL DILATION (N/A Esophagus)     Patient location during evaluation: PACU Anesthesia Type: General Level of consciousness: awake and alert Pain management: pain level controlled Vital Signs Assessment: post-procedure vital signs reviewed and stable Respiratory status: spontaneous breathing, nonlabored ventilation and respiratory function stable Cardiovascular status: blood pressure returned to baseline and stable Postop Assessment: no apparent nausea or vomiting Anesthetic complications: no    Last Vitals:  Vitals:   10/05/19 1010 10/05/19 1018  BP:  (!) 141/71  Pulse:  81  Resp:  17  Temp:  (!) 36.3 C  SpO2: 98% 96%    Last Pain:  Vitals:   10/05/19 1018  TempSrc: Oral  PainSc: 0-No pain   Pain Goal:                   Lynda Rainwater

## 2019-10-07 DIAGNOSIS — Z8521 Personal history of malignant neoplasm of larynx: Secondary | ICD-10-CM | POA: Diagnosis not present

## 2019-10-07 DIAGNOSIS — Z9002 Acquired absence of larynx: Secondary | ICD-10-CM | POA: Diagnosis not present

## 2019-10-07 DIAGNOSIS — Z08 Encounter for follow-up examination after completed treatment for malignant neoplasm: Secondary | ICD-10-CM | POA: Diagnosis not present

## 2019-10-17 ENCOUNTER — Other Ambulatory Visit: Payer: Self-pay | Admitting: Family Medicine

## 2019-10-17 DIAGNOSIS — E039 Hypothyroidism, unspecified: Secondary | ICD-10-CM

## 2019-10-17 DIAGNOSIS — J302 Other seasonal allergic rhinitis: Secondary | ICD-10-CM

## 2019-10-17 NOTE — Telephone Encounter (Signed)
Patient needs to have her TSH rechecked.  Was highly elevated last visit.  I will put in a future lab order.  Please call pt to let her know she needs to have this rechecked. Will refill one month of synthroid in the meantime so as to not disrupt her treatment.

## 2019-10-18 NOTE — Telephone Encounter (Signed)
LVM to call office to inform her of below and to assist in getting this scheduled.  Will also send MyChart message requesting her to call and schedule lab visit. Amari Burnsworth Zimmerman Rumple, CMA

## 2019-10-19 NOTE — Telephone Encounter (Signed)
LVM to call office to inform her of below and to assist in scheduling a lab visit. Zimmerman Rumple, CMA

## 2019-10-19 NOTE — Telephone Encounter (Signed)
Patient returned call to nurse line. Informed of below. Scheduled lab visit for Monday 5/10 at 4:00.   Talbot Grumbling, RN

## 2019-10-24 ENCOUNTER — Other Ambulatory Visit: Payer: Self-pay | Admitting: Family Medicine

## 2019-10-24 ENCOUNTER — Other Ambulatory Visit: Payer: Self-pay

## 2019-10-24 ENCOUNTER — Other Ambulatory Visit: Payer: Medicaid Other

## 2019-10-24 DIAGNOSIS — E039 Hypothyroidism, unspecified: Secondary | ICD-10-CM

## 2019-10-25 LAB — TSH: TSH: 1.06 u[IU]/mL (ref 0.450–4.500)

## 2019-11-10 ENCOUNTER — Ambulatory Visit (INDEPENDENT_AMBULATORY_CARE_PROVIDER_SITE_OTHER): Payer: Medicaid Other | Admitting: Gastroenterology

## 2019-11-10 ENCOUNTER — Encounter: Payer: Self-pay | Admitting: Gastroenterology

## 2019-11-10 VITALS — BP 132/80 | HR 89 | Ht 67.0 in | Wt 221.4 lb

## 2019-11-10 DIAGNOSIS — K222 Esophageal obstruction: Secondary | ICD-10-CM | POA: Diagnosis not present

## 2019-11-10 DIAGNOSIS — R1314 Dysphagia, pharyngoesophageal phase: Secondary | ICD-10-CM

## 2019-11-10 NOTE — Progress Notes (Signed)
[facility] Progress Note  Chief Complaint: Pharyngeal esophageal dysphagia  Subjective  History: Urmi was sent by her ENT physician for reevaluation due to chronic oropharyngeal dysphagia.  I saw her in January 2020 for this with my note as follows; "Lee-Ann was referred by [name] of ENT for ongoing dysphagia.  She has a history of laryngeal cancer treated with surgery, radiation, chemotherapy and tracheostomy.  She has recurrent dysphagia, and underwent bougie dilation under anesthesia by [name] in January 2018, June 2019, and November 2019 up to a maximal diameter of 44 [place].  She reports minimal brief relief of dysphagia after most recent dilation, thus she was referred to [place].   She has dysphagia to both solids and liquids that occurs some days but not others.  She might drink some water and it feels completely stopped and then comes right back up.  Other times liquid, medicines and food will pass without much difficulty at all.  Her appetite is been good and her weight stable.  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  There was no intrinsic fibrotic stricture, thus was not amenable to endoscopic or bougie dilation.  _________________________________  She was hospitalized October 2020 after aspirating part of her TEP, and ultimately had to be removed with rigid bronchoscopy.  Keily underwent bougie dilation again by ENT March 26 and April 21 of this year.  [name] referred the patient back to me for consideration of an endoscopic solution to this dysphagia.  She always has dysphagia to solids.  Sometimes he gets worse and she will go on a liquid diet and soup.  Sometimes is difficult to get pills down.  Sarayu feels that the dilations performed by her ENT physician sometimes do not help, other times might give her  relief for months.  Given that temporary improvement still leaves her with a baseline of severe dysphagia. She previously had no interest in a gastrostomy when I brought it up last year, and still has no interest in doing so.  ROS: Cardiovascular:  no chest pain Respiratory: Chronic dyspnea and cough (She communicates with the use of her TEP) The patient's Past Medical, Family and Social History were reviewed and are on file in the EMR.  Objective:  Med list reviewed  Current Outpatient Medications:  .  albuterol (PROVENTIL) (2.5 MG/3ML) 0.083% nebulizer solution, Take 3 mLs (2.5 mg total) by nebulization every 6 (six) hours as needed for wheezing or shortness of breath., Disp: 150 mL, Rfl: 1 .  amLODipine (NORVASC) 10 MG tablet, Take 1 tablet (10 mg total) by mouth daily., Disp: 90 tablet, Rfl: 3 .  anastrozole (ARIMIDEX) 1 MG tablet, Take 1 tablet by mouth daily, Disp: 90 tablet, Rfl: 0 .  benzonatate (TESSALON) 100 MG capsule, TAKE 1 CAPSULE BY MOUTH TWICE DAILY AS NEEDED FOR COUGH, Disp: 20 capsule, Rfl: 0 .  buPROPion (WELLBUTRIN XL) 150 MG 24 hr tablet, Take 1 tablet (150 mg total) by mouth daily., Disp: 90 tablet, Rfl: 3 .  cetirizine (ZYRTEC) 10 MG tablet, Take 1 tablet by mouth once daily, Disp: 30 tablet, Rfl: 0 .  chlorpheniramine (CHLOR-TRIMETON) 4 MG tablet, Take 4 mg by mouth daily., Disp: , Rfl:  .  EUTHYROX 175 MCG tablet, TAKE 1 TABLET BY MOUTH ONCE DAILY BEFORE BREAKFAST, Disp: 30 tablet, Rfl: 0 .  losartan (COZAAR) 100 MG tablet, Take 1 tablet (100 mg total) by mouth daily., Disp: 30 tablet, Rfl: 3 .  meloxicam (MOBIC) 15 MG tablet, TAKE 1 TABLET BY MOUTH  DAILY AS NEEDED, Disp: 30 tablet, Rfl: 0 .  metoprolol tartrate (LOPRESSOR) 50 MG tablet, Take 1 tablet (50 mg total) by mouth 2 (two) times daily., Disp: 90 tablet, Rfl: 2 .  mometasone-formoterol (DULERA) 200-5 MCG/ACT AERO, Inhale 2 puffs into the lungs 2 (two) times daily., Disp: 13 g, Rfl: 0 .  nystatin cream  (MYCOSTATIN), APPLY  CREAM TOPICALLY TWICE DAILY AS NEEDED, Disp: 30 g, Rfl: 0 .  omeprazole (PRILOSEC) 40 MG capsule, Take 1 capsule (40 mg total) by mouth 2 (two) times daily., Disp: 90 capsule, Rfl: 0 .  PROVENTIL HFA 108 (90 Base) MCG/ACT inhaler, INHALE 2 PUFFS BY MOUTH EVERY 4 HOURS AS NEEDED OR SHORTNESS OF BREATH, Disp: 14 g, Rfl: 2 .  tiotropium (SPIRIVA) 18 MCG inhalation capsule, Place 18 mcg into inhaler and inhale daily., Disp: , Rfl:  .  TRANSDERM-SCOP, 1.5 MG, 1 MG/3DAYS, PLACE 1 PATCH ONTO THE SKIN EVERY 3 DAYS AS NEEDED (Patient taking differently: Place 1 patch onto the skin daily as needed. Nausea), Disp: 10 patch, Rfl: 2   Vital signs in last 24 hrs: Vitals:   11/10/19 1402  BP: 132/80  Pulse: 89  SpO2: 95%    Physical Exam   Ambulatory, alert and pleasant.  Chronic tracheostomy, productive cough. Phonates by occluding her TEP. Remainder exam not performed.  Entire visit spent in conversation. Labs:   ___________________________________________ Radiologic studies:   ____________________________________________ Other: Recent procedure reports of the esophageal dilations were reviewed.  _____________________________________________ Assessment & Plan  Assessment: Encounter Diagnoses  Name Primary?  . Pharyngoesophageal dysphagia Yes  . 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  There is no intrinsic fibrotic stricture, which is why she does not get consistent or long-lasting improvement from bougie dilation, nor is she likely to in the future.  I am afraid I do not have any different or more durable endoscopic solution to offer.  It is not clear to me that these bougie dilations have been of great benefit, but she feels strongly about continuing it with [name] because sometimes she gets a period of some improvement afterwards.  She still has no intention of  having a gastrostomy.  If she should ever change her mind on that, it would need placement by general surgery or interventional radiology, as I believe an endoscopic placement is precluded by the degree of stenosis and the TEP in place.     20 minutes were spent on this encounter (including chart review, history/exam, counseling/coordination of care, and documentation)  Nelida Meuse III

## 2019-11-10 NOTE — Patient Instructions (Signed)
If you are age 62 or older, your body mass index should be between 23-30. Your Body mass index is 34.68 kg/m. If this is out of the aforementioned range listed, please consider follow up with your Primary Care Provider.  If you are age 53 or younger, your body mass index should be between 19-25. Your Body mass index is 34.68 kg/m. If this is out of the aformentioned range listed, please consider follow up with your Primary Care Provider.   It was a pleasure to see you today!  Dr. Loletha Carrow

## 2019-11-15 ENCOUNTER — Ambulatory Visit (INDEPENDENT_AMBULATORY_CARE_PROVIDER_SITE_OTHER): Payer: Medicaid Other | Admitting: Sports Medicine

## 2019-11-15 ENCOUNTER — Other Ambulatory Visit: Payer: Self-pay

## 2019-11-15 VITALS — BP 136/94 | Ht 67.0 in | Wt 222.0 lb

## 2019-11-15 DIAGNOSIS — M25561 Pain in right knee: Secondary | ICD-10-CM

## 2019-11-15 MED ORDER — METHYLPREDNISOLONE ACETATE 40 MG/ML IJ SUSP
40.0000 mg | Freq: Once | INTRAMUSCULAR | Status: AC
  Administered 2019-11-15: 40 mg via INTRA_ARTICULAR

## 2019-11-15 NOTE — Assessment & Plan Note (Signed)
Patient with right knee pain.  Former x-rays reviewed by both me and Dr. Micheline Chapman showing limited joint cartilage on medial aspect which is the likely cause of her pain.  Discussed multiple treatment options including corticosteroid injections, medication treatment, conservative measures.  Patient states that in the past since corticosteroid injections of work she would like to try this again.  Risks and benefits discussed with patient.  Procedure performed: knee intraarticular corticosteroid injection; palpation guided  Consent obtained and verified. Time-out conducted. Noted no overlying erythema, induration, or other signs of local infection. The lateral medial joint space was palpated and marked. The overlying skin was prepped in a sterile fashion. Topical analgesic spray: Ethyl chloride. Joint: right knee Needle: 25 gauge, 1.5 inch Completed without difficulty. Meds: 1 mg methylrprednisolone, 3 ml 1% lidocaine without epinephrine  Advised to call if fevers/chills, erythema, induration, drainage, or persistent bleeding.  Consent obtained and verified. Time-out conducted. Procedure supervised by Dr. Micheline Chapman

## 2019-11-15 NOTE — Progress Notes (Signed)
   Deborah [name] is a 64 y.o. female who presents to [facility] today for the following:  Right knee pain Patient presenting with right knee pain x4 months.  States it is keeping her up at night she is unable to sleep.  States it feels like she is being hit in the knee with a hammer.  States that she feels very stiff in the morning and has difficulty bending her knee secondary to pain.  Has tried Tylenol, Ace wrap, braces, Mobic none of which have helped.  States in the past she has had a steroid injection which improved the pain for 6 months.  States that the knee does lock but is not unstable.  Does report edema.  Feels warm to her.  Denies any trauma.   PMH reviewed. HTN, larynx cancer ROS as above. Medications reviewed.  Exam:  BP (!) 136/94   Ht 5\' 7"  (1.702 m)   Wt 222 lb (100.7 kg)   BMI 34.77 kg/m  Gen: Well NAD MSK: Knee: Difficult exam 2/2 pain  - Inspection: no gross deformity. Swelling noted on  Medial joint line. No erythema or bruising. Skin intact - Palpation: TTP of medial joint line  - ROM: limited active ROM with flexion and extension in knee and 2/2 pain. Full hip ROM  - Strength: 5/5 strength - Neuro/vasc: NV intact - Special Tests: - LIGAMENTS: unable to assess anterior and posterior drawer as  Patient unable to bend knee, unable to assess Lachman's 2/2 pain with bending knee, no MCL or LCL laxity  -- MENISCUS: positive [place]ssaly  -- PF JOINT: nml patellar mobility bilaterally.  negative patellar grind, negative patellar apprehension Hips: normal ROM    Assessment and Plan: 1) Knee pain Patient with right knee pain.  Former x-rays reviewed by both me and Dr. Micheline Chapman showing limited joint cartilage on medial aspect which is the likely cause of her pain.  Discussed multiple treatment options including corticosteroid injections, medication treatment, conservative measures.  Patient states that in the past since corticosteroid injections of work she would like to try  this again.  Risks and benefits discussed with patient.  Procedure performed: knee intraarticular corticosteroid injection; palpation guided  Consent obtained and verified. Time-out conducted. Noted no overlying erythema, induration, or other signs of local infection. The lateral medial joint space was palpated and marked. The overlying skin was prepped in a sterile fashion. Topical analgesic spray: Ethyl chloride. Joint: right knee Needle: 25 gauge, 1.5 inch Completed without difficulty. Meds: 1 mg methylrprednisolone, 3 ml 1% lidocaine without epinephrine  Advised to call if fevers/chills, erythema, induration, drainage, or persistent bleeding.  Consent obtained and verified. Time-out conducted. Procedure supervised by Dr. Stormy Card, DO, PGY-3 [facility] 11/15/2019 4:42 PM  Patient seen and evaluated with the resident.  I agree with the above plan of care.  Patient's right knee was injected today with cortisone utilizing an anterior lateral approach.  She tolerates this without difficulty.  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  Follow-up as needed.

## 2019-11-15 NOTE — Assessment & Plan Note (Signed)
>>  ASSESSMENT AND PLAN FOR KNEE PAIN WRITTEN ON 11/15/2019  4:40 PM BY Oralia Manis, DO  Patient with right knee pain.  Former x-rays reviewed by both me and Dr. Margaretha Sheffield showing limited joint cartilage on medial aspect which is the likely cause of her pain.  Discussed multiple treatment options including corticosteroid injections, medication treatment, conservative measures.  Patient states that in the past since corticosteroid injections of work she would like to try this again.  Risks and benefits discussed with patient.  Procedure performed: knee intraarticular corticosteroid injection; palpation guided  Consent obtained and verified. Time-out conducted. Noted no overlying erythema, induration, or other signs of local infection. The lateral medial joint space was palpated and marked. The overlying skin was prepped in a sterile fashion. Topical analgesic spray: Ethyl chloride. Joint: right knee Needle: 25 gauge, 1.5 inch Completed without difficulty. Meds: 1 mg methylrprednisolone, 3 ml 1% lidocaine without epinephrine  Advised to call if fevers/chills, erythema, induration, drainage, or persistent bleeding.  Consent obtained and verified. Time-out conducted. Procedure supervised by Dr. Margaretha Sheffield

## 2019-11-16 ENCOUNTER — Encounter: Payer: Self-pay | Admitting: Sports Medicine

## 2019-11-24 ENCOUNTER — Ambulatory Visit
Admission: RE | Admit: 2019-11-24 | Discharge: 2019-11-24 | Disposition: A | Discharge status: Home / Self Care | Payer: Medicaid Other | Source: Ambulatory Visit | Attending: Sports Medicine | Admitting: Sports Medicine

## 2019-11-24 ENCOUNTER — Telehealth: Payer: Self-pay

## 2019-11-24 ENCOUNTER — Other Ambulatory Visit: Payer: Self-pay

## 2019-11-24 ENCOUNTER — Other Ambulatory Visit: Payer: Self-pay | Admitting: Family Medicine

## 2019-11-24 DIAGNOSIS — M25561 Pain in right knee: Secondary | ICD-10-CM

## 2019-11-24 DIAGNOSIS — J302 Other seasonal allergic rhinitis: Secondary | ICD-10-CM

## 2019-11-24 NOTE — Telephone Encounter (Signed)
Dr. Micheline Chapman would like to get updated x-rays of her knee. He will call her with the results.

## 2019-11-28 ENCOUNTER — Other Ambulatory Visit: Payer: Self-pay | Admitting: *Deleted

## 2019-11-28 ENCOUNTER — Telehealth: Payer: Self-pay | Admitting: Sports Medicine

## 2019-11-28 DIAGNOSIS — M1711 Unilateral primary osteoarthritis, right knee: Secondary | ICD-10-CM

## 2019-11-28 NOTE — Telephone Encounter (Signed)
  I spoke with Deborah [name] on the phone today after reviewing x-rays of her right knee.  We ordered these x-rays after she called the office stating that a recent cortisone injection had not helped her pain.  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  She is still in quite a bit of pain.  I recommended surgical consultation with [facility]u [facility].  She is in agreement with that plan.  I will defer further work-up and treatment to the discretion of Dr.Xu she will follow up with me as needed.

## 2019-12-02 ENCOUNTER — Encounter: Payer: Self-pay | Admitting: Orthopaedic Surgery

## 2019-12-02 ENCOUNTER — Ambulatory Visit (INDEPENDENT_AMBULATORY_CARE_PROVIDER_SITE_OTHER): Payer: Medicaid Other | Admitting: Orthopaedic Surgery

## 2019-12-02 ENCOUNTER — Other Ambulatory Visit: Payer: Self-pay

## 2019-12-02 VITALS — Ht 67.0 in | Wt 225.0 lb

## 2019-12-02 DIAGNOSIS — M1711 Unilateral primary osteoarthritis, right knee: Secondary | ICD-10-CM

## 2019-12-02 NOTE — Progress Notes (Signed)
Office Visit Note   Patient: Deborah [name]           Date of Birth: 07-Oct-1955           MRN: [id number] Visit Date: 12/02/2019              Requested by: Thurman Coyer, DO [address] [place],  [place] [address] PCP: Benay Pike, MD   Assessment & Plan: Visit Diagnoses:  1. Primary osteoarthritis of right knee     Plan: Impression is end-stage right knee DJD.  Patient has failed conservative treatment at this point and wishes to proceed with a right total knee replacement.  Based on discussion of risk benefits rehab recovery she has elected to move forward with scheduling.  Denies history of DVT, nickel allergy.  We look forward to treating her in the operating room in the near future.  Follow-Up Instructions: Return if symptoms worsen or fail to improve.   Orders:  No orders of the defined types were placed in this encounter.  No orders of the defined types were placed in this encounter.     Procedures: No procedures performed   Clinical Data: No additional findings.   Subjective: Chief Complaint  Patient presents with  . Right Knee - Pain    [name] is a 64 year old female with chronic right knee pain for many years worse.  She states that she has constant knee pain is worse on the medial side.  She has undergone a cortisone injection as well as tried NSAIDs and Voltaren gel and she has use a knee brace all without relief.  She does have a trach from prior neck cancer from smoking.  She is severely limited by the pain.  She is unable to sleep at night.   Review of Systems  Constitutional: Negative.   HENT: Negative.   Eyes: Negative.   Respiratory: Negative.   Cardiovascular: Negative.   Endocrine: Negative.   Musculoskeletal: Negative.   Neurological: Negative.   Hematological: Negative.   Psychiatric/Behavioral: Negative.   All other systems reviewed and are negative.    Objective: Vital Signs: Ht _0  (1.702 m)   Wt 225 lb  (102.1 kg)   BMI 35.24 kg/m   Physical Exam Vitals and nursing note reviewed.  Constitutional:      Appearance: She is well-developed.  HENT:     Head: Normocephalic and atraumatic.  Pulmonary:     Effort: Pulmonary effort is normal.  Abdominal:     Palpations: Abdomen is soft.  Musculoskeletal:     Cervical back: Neck supple.  Skin:    General: Skin is warm.     Capillary Refill: Capillary refill takes less than 2 seconds.  Neurological:     Mental Status: She is alert and oriented to person, place, and time.  Psychiatric:        Behavior: Behavior normal.        Thought Content: Thought content normal.        Judgment: Judgment normal.     Ortho Exam Right knee shows very painful range of motion with slight varus deformity.  2+ patellofemoral crepitus.  Collaterals and cruciates are stable. Specialty Comments:  No specialty comments available.  Imaging: No results found.   PMFS History: Patient Active Problem List   Diagnosis Date Noted  . Epidermal cyst of neck 07/21/2019  . Localized swelling, mass and lump, neck 07/20/2019  . Allergic rhinitis 05/20/2019  . GERD (gastroesophageal reflux disease)   .  COPD (chronic obstructive pulmonary disease) ([place])   . Chronic diastolic (congestive) heart failure ([place])   . Foreign body aspiration   . Breast cancer ([place]) 07/06/2018  . 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[place]) 04/08/2018  . Carpal tunnel syndrome 03/16/2018  . Obesity 08/24/2017  . Plantar fasciitis 06/06/2016  . Low back pain 05/21/2016  . Health care maintenance 12/29/2015  . Osteoarthritis of knees, bilateral 05/31/2015  . Knee pain 01/04/2015  . Dysphagia 08/16/2014  . Weight gain 08/05/2014  . DOE (dyspnea on exertion) 04/28/2014  . Frequent falls 12/23/2013  . Bilateral shoulder pain 04/07/2013  . Anemia in chronic illness 08/11/2012  . Status post trachelectomy 08/04/2012    . Chronic pain 01/20/2012  . History of head and neck cancer 09/26/2011  . Hx of radiation therapy   . Tracheitis 07/17/2011  . Seizure ([place]) 07/17/2011  . Hypothyroidism 03/19/2011  . Essential hypertension 03/19/2011  . 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 Anemia   . Arthritis    in knees  . Asthma   . Breast cancer ([place])   . Bronchitis   . COPD (chronic obstructive pulmonary disease) ([place])   . Diverticulosis 11/15/2012    noted on screening colonoscopy   . Dyspnea    with COPD exerbation  . Esophageal stricture   . Former smoker 03/19/2011  . GERD (gastroesophageal reflux disease)   . Heart murmur    asymptomatic   . History of laryngectomy   . History of radiation therapy 11/15/18- 12/06/18   Right Breast total dose 42.56 Gy in 16 fractions.   [name] Hx of radiation therapy 09/03/10 to 10/16/2010   supraglottic larynx  . Hypertension   . 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 Internal hemorrhoid 11/15/2012    small, noted on screening colonoscopy   . 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  . Leukocytopenia   . Nausea alone 07/28/2013  . Neck pain 01/21/2012  . Normal MRI 07/14/11   negative for mestasis   . Pneumonia 2012  . Sciatica   . Seizures ([place])    07/24/11 off Effexor w/o seizure  . Sepsis ([place]) 08/04/12  . Sinusitis, chronic 07/20/2011   Bilateral maxillary, identified on MRI of head 07/14/11.    . 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[place])     Family History  Problem Relation Age of Onset  . Heart disease Mother   . Heart disease Father   . Heart disease Sister   . Cancer Brother        type unknown  . Liver disease Maternal Grandmother   . Cancer Sister        type unknown  . Diabetes Sister   . Breast cancer Neg Hx     Past Surgical History:  Procedure Laterality Date  . BREAST LUMPECTOMY Right 07/06/2018  . BREAST LUMPECTOMY WITH RADIOACTIVE SEED LOCALIZATION Right 07/06/2018    Procedure: RIGHT BREAST LUMPECTOMY WITH RADIOACTIVE SEED LOCALIZATION;  Surgeon: Rolm Bookbinder, MD;  Location: [facility];  Service: General;  Laterality: Right;  . COLONOSCOPY N/A 11/15/2012   Procedure: COLONOSCOPY;  Surgeon: Lafayette Dragon, MD;  [facility];  Service: Endoscopy;  Laterality: N/A;  . DENTAL RESTORATION/EXTRACTION WITH X-RAY    . ESOPHAGEAL DILATION N/A 09/09/2019   Procedure: ESOPHAGEAL DILATION;  Surgeon: [name], MD;  Location: [facility];  Service: ENT;  Laterality: N/A;  . ESOPHAGEAL DILATION N/A 10/05/2019   Procedure: ESOPHAGEAL DILATION;  Surgeon: [name], MD;  Location: [facility];  Service: ENT;  Laterality: N/A;  via tracheostomy  . ESOPHAGOGASTRODUODENOSCOPY (EGD) WITH PROPOFOL N/A 08/12/2018   Procedure: ESOPHAGOGASTRODUODENOSCOPY (EGD) WITH PROPOFOL;  Surgeon: Doran Stabler, MD;  Location: [facility];  Service: Gastroenterology;  Laterality: N/A;  . ESOPHAGOSCOPY  06/21/2012   Procedure: ESOPHAGOSCOPY;  Surgeon: [name], MD;  Location: [facility];  Service: ENT;  Laterality: N/A;  . ESOPHAGOSCOPY WITH DILITATION N/A 09/21/2014   Procedure: ESOPHAGOSCOPY WITH DILITATION;  Surgeon: [name], MD;  Location: [facility];  Service: ENT;  Laterality: N/A;  . ESOPHAGOSCOPY WITH DILITATION N/A 07/04/2016   Procedure: ESOPHAGOSCOPY WITH DILITATION;  Surgeon: [name], MD;  Location: [facility];  Service: ENT;  Laterality: N/A;  . ESOPHAGOSCOPY WITH DILITATION N/A 12/01/2017   Procedure: ESOPHAGOSCOPY WITH DILITATION;  Surgeon: [name], MD;  Location: [facility];  Service: ENT;  Laterality: N/A;  . ESOPHAGOSCOPY WITH DILITATION N/A 04/19/2018   Procedure: ESOPHAGOSCOPY WITH DILITATION;  Surgeon: [name], MD;  Location: [facility];  Service: ENT;  Laterality: N/A;  . ESOPHAGOSCOPY WITH DILITATION N/A 03/07/2019   Procedure: Esophagoscopy with dilatation;  Surgeon: [name], MD;  Location: [facility];  Service: ENT;  Laterality: N/A;  . FLEXIBLE BRONCHOSCOPY  01/08/2018      . FOREIGN BODY REMOVAL BRONCHIAL  10/02/2011   Procedure: REMOVAL FOREIGN BODY BRONCHIAL;  Surgeon: Ruby Cola, MD;  Location: [facility];  Service: ENT;  Laterality: N/A;  . FOREIGN BODY REMOVAL BRONCHIAL N/A 01/08/2018   Procedure: REMOVAL FOREIGN BODY BRONCHIAL;  Surgeon: Jodi Marble, MD;  [facility];  Service: ENT;  Laterality: N/A;  . LARYNGECTOMY    . Porta cath removal    . PORTACATH PLACEMENT  09/17/10   Tip in cavoatrial junction  . RE-EXCISION OF BREAST CANCER,SUPERIOR MARGINS Right 07/29/2018   Procedure: RE-EXCISION OF RIGHT BREAST MEDIAL MARGINS;  Surgeon: Rolm Bookbinder, MD;  Location: [facility];  Service: General;  Laterality: Right;  . RIGID ESOPHAGOSCOPY N/A 03/19/2019   Procedure: FLEXIBLE ESOPHAGOSCOPY;  Surgeon: Jodi Marble, MD;  Location: [facility];  Service: ENT;  Laterality: N/A;  . STOMAPLASTY N/A 10/21/2016   Procedure: Zola Button;  Surgeon: [name], MD;  Location: [facility];  Service: ENT;  Laterality: N/A;  . TRACHEAL DILITATION  07/16/2011   Procedure: TRACHEAL DILITATION;  Surgeon: Beckie Salts, MD;  Location: [facility];  Service: ENT;  Laterality: N/A;  dilation of tracheal stoma and replacement of stoma tube  . TUBAL LIGATION  1982   Social History   Occupational History  . Occupation: part time    Comment: warehouse work  Tobacco Use  . Smoking status: Former Smoker    Packs/day: 2.00    Years: 20.00    Pack years: 40.00    Types: Cigarettes    Quit date: 09/17/2010    Years since quitting: 9.2  . Smokeless tobacco: Never Used  Vaping Use  . Vaping Use: Never used  Substance and Sexual Activity  . Alcohol use: Yes    Comment: Socially drinks   . Drug use: No    Comment: tried cocaine 1 time 2010 only used 1 time  . Sexual activity: Yes    Birth control/protection: Surgical

## 2019-12-05 ENCOUNTER — Other Ambulatory Visit: Payer: Self-pay | Admitting: Family

## 2019-12-08 ENCOUNTER — Other Ambulatory Visit (HOSPITAL_COMMUNITY)
Admission: RE | Admit: 2019-12-08 | Discharge: 2019-12-08 | Disposition: A | Discharge status: Home / Self Care | Payer: Medicaid Other | Source: Ambulatory Visit | Attending: Orthopaedic Surgery | Admitting: Orthopaedic Surgery

## 2019-12-08 ENCOUNTER — Encounter (HOSPITAL_COMMUNITY): Payer: Self-pay

## 2019-12-08 ENCOUNTER — Encounter (HOSPITAL_COMMUNITY)
Admission: RE | Admit: 2019-12-08 | Discharge: 2019-12-08 | Disposition: A | Discharge status: Home / Self Care | Payer: Medicaid Other | Source: Ambulatory Visit | Attending: Orthopaedic Surgery | Admitting: Orthopaedic Surgery

## 2019-12-08 ENCOUNTER — Other Ambulatory Visit: Payer: Self-pay

## 2019-12-08 DIAGNOSIS — I10 Essential (primary) hypertension: Secondary | ICD-10-CM | POA: Diagnosis not present

## 2019-12-08 DIAGNOSIS — Z79811 Long term (current) use of aromatase inhibitors: Secondary | ICD-10-CM | POA: Diagnosis not present

## 2019-12-08 DIAGNOSIS — R9431 Abnormal electrocardiogram [ECG] [EKG]: Secondary | ICD-10-CM | POA: Insufficient documentation

## 2019-12-08 DIAGNOSIS — Z79890 Hormone replacement therapy (postmenopausal): Secondary | ICD-10-CM | POA: Diagnosis not present

## 2019-12-08 DIAGNOSIS — K219 Gastro-esophageal reflux disease without esophagitis: Secondary | ICD-10-CM | POA: Diagnosis not present

## 2019-12-08 DIAGNOSIS — Z7951 Long term (current) use of inhaled steroids: Secondary | ICD-10-CM | POA: Insufficient documentation

## 2019-12-08 DIAGNOSIS — Z20822 Contact with and (suspected) exposure to covid-19: Secondary | ICD-10-CM | POA: Diagnosis not present

## 2019-12-08 DIAGNOSIS — Z853 Personal history of malignant neoplasm of breast: Secondary | ICD-10-CM | POA: Diagnosis not present

## 2019-12-08 DIAGNOSIS — Z923 Personal history of irradiation: Secondary | ICD-10-CM | POA: Diagnosis not present

## 2019-12-08 DIAGNOSIS — J449 Chronic obstructive pulmonary disease, unspecified: Secondary | ICD-10-CM | POA: Diagnosis not present

## 2019-12-08 DIAGNOSIS — Z01818 Encounter for other preprocedural examination: Secondary | ICD-10-CM | POA: Diagnosis not present

## 2019-12-08 DIAGNOSIS — E890 Postprocedural hypothyroidism: Secondary | ICD-10-CM | POA: Insufficient documentation

## 2019-12-08 DIAGNOSIS — Z79899 Other long term (current) drug therapy: Secondary | ICD-10-CM | POA: Insufficient documentation

## 2019-12-08 DIAGNOSIS — M1711 Unilateral primary osteoarthritis, right knee: Secondary | ICD-10-CM | POA: Insufficient documentation

## 2019-12-08 DIAGNOSIS — Z87891 Personal history of nicotine dependence: Secondary | ICD-10-CM | POA: Insufficient documentation

## 2019-12-08 DIAGNOSIS — Z8521 Personal history of malignant neoplasm of larynx: Secondary | ICD-10-CM | POA: Diagnosis not present

## 2019-12-08 LAB — URINALYSIS, ROUTINE W REFLEX MICROSCOPIC
Bilirubin Urine: NEGATIVE
Glucose, UA: NEGATIVE mg/dL
Hgb urine dipstick: NEGATIVE
Ketones, ur: NEGATIVE mg/dL
Leukocytes,Ua: NEGATIVE
Nitrite: NEGATIVE
Protein, ur: NEGATIVE mg/dL
Specific Gravity, Urine: 1.01 (ref 1.005–1.030)
pH: 5 (ref 5.0–8.0)

## 2019-12-08 LAB — BASIC METABOLIC PANEL
Anion gap: 8 (ref 5–15)
BUN: 18 mg/dL (ref 8–23)
CO2: 27 mmol/L (ref 22–32)
Calcium: 9.6 mg/dL (ref 8.9–10.3)
Chloride: 103 mmol/L (ref 98–111)
Creatinine, Ser: 0.88 mg/dL (ref 0.44–1.00)
GFR calc Af Amer: >60 mL/min (ref >60)
GFR calc non Af Amer: >60 mL/min (ref >60)
Glucose, Bld: 104 mg/dL — ABNORMAL HIGH (ref 70–99)
Potassium: 3.7 mmol/L (ref 3.5–5.1)
Sodium: 138 mmol/L (ref 135–145)

## 2019-12-08 LAB — PROTIME-INR
INR: 1 (ref 0.8–1.2)
Prothrombin Time: 12.8 seconds (ref 11.4–15.2)

## 2019-12-08 LAB — SURGICAL PCR SCREEN
MRSA, PCR: NEGATIVE
Staphylococcus aureus: NEGATIVE

## 2019-12-08 LAB — APTT: aPTT: 30 seconds (ref 24–36)

## 2019-12-08 LAB — CBC
HCT: 39.3 % (ref 36.0–46.0)
Hemoglobin: 12.4 g/dL (ref 12.0–15.0)
MCH: 29.2 pg (ref 26.0–34.0)
MCHC: 31.6 g/dL (ref 30.0–36.0)
MCV: 92.7 fL (ref 80.0–100.0)
Platelets: 369 10*3/uL (ref 150–400)
RBC: 4.24 MIL/uL (ref 3.87–5.11)
RDW: 13.2 % (ref 11.5–15.5)
WBC: 5.9 10*3/uL (ref 4.0–10.5)
nRBC: 0 % (ref 0.0–0.2)

## 2019-12-08 LAB — SARS CORONAVIRUS 2 (TAT 6-24 HRS): SARS Coronavirus 2: NEGATIVE

## 2019-12-08 NOTE — Progress Notes (Signed)
Anesthesia Chart Review:   Case: [id number] Date/Time: 12/12/19 1228   Procedure: RIGHT TOTAL KNEE ARTHROPLASTY (Right Knee)   Anesthesia type: Spinal   Pre-op diagnosis: right knee degenerative joint disease   Location: [facility] OR ROOM 04 / [facility]   Surgeons: Leandrew Koyanagi, MD      DISCUSSION:  Pt is a 64 year old with hx HTN, heart murmur (mild aortic regurgitation on 2019 echo), asthma, COPD, hypothyroidism, anemia,laryngeal cancer (s/p laryngectomy and radiation, tracheostomy dependent, s/p stomaplasty 10/21/16, TEP managed at [facility]).   - Patient has trach and tracheoesophageal puncture (TEP) prosthesis which is a small plastic or silicone valve that fits into the opening. The valve keeps food out of the trachea. Patients can cancover their stoma with a finger, and force air into the esophagus through the valve.  Pt has undergone anesthesia multiple times in the past in the [facility], most recently on 10/05/19 for esophagoscopy with dilation   VS: BP 127/66   Pulse 72   Temp 37.1 C (Oral)   Resp 17   Ht _0  (1.702 m)   Wt 97.3 kg   SpO2 96%   BMI 33.61 kg/m    PROVIDERS: - PCP is Benay Pike, MD - Used to see cardiologist [name], MD. Seen by [name], PA 10/01/17 for dyspnea evaluation. PRN f/u recommended after testing (see below). - GI is Wray Kearns, MD - Oncologist is Nicholas Lose, MD. Last office visit 07/12/19 with Wilber Bihari, NP   LABS: Labs reviewed: Acceptable for surgery. (all labs ordered are listed, but only abnormal results are displayed)  Labs Reviewed  BASIC METABOLIC PANEL - Abnormal; Notable for the following components:      Result Value   Glucose, Bld 104 (*)    All other components within normal limits  URINALYSIS, ROUTINE W REFLEX MICROSCOPIC - Abnormal; Notable for the following components:   Color, Urine STRAW (*)    All other components within normal limits  SURGICAL PCR SCREEN  APTT  CBC  PROTIME-INR      IMAGES:  CXR 03/19/19:  1. No tracheostomy tube identified. 2. Triangular opacity projecting over the left mid chest medially, indeterminate for small foreign body or artifact. CT could be obtained for further evaluation if clinically indicated 3. Airspace disease at the left base, may reflect atelectasis or pneumonia.   EKG 12/08/19: NSR. Minimal voltage criteria for LVH, may be normal variant. T wave abnormality, consider lateral ischemia. Appears stable when compared to prior EKG 03/03/19   CV:  Nuclear stress test 10/07/17:  Nuclear stress EF: 61%.  There was no ST segment deviation noted during stress.  Defect 1: There is a medium defect of moderate severity present in the mid anteroseptal and apex location.  The study is normal.  This is a low risk study.  The left ventricular ejection fraction is normal (55-65%).   Echo 08/31/17: - Left ventricle: The cavity size was mildly reduced. Wall thickness was increased in a pattern of moderate LVH. Systolic function was vigorous. The estimated ejection fraction was in therange of 65% to 70%. Wall motion was normal; there were noregional wall motion abnormalities. Features are consistent witha pseudonormal left ventricular filling pattern, with concomitant abnormal relaxation and increased filling pressure (grade 2 diastolic dysfunction). - Aortic valve: There was mild regurgitation. - Pulmonary arteries: Systolic pressure was mildly increased. PA peak pressure: 35 mm Hg (S).   Past Medical History:  Diagnosis Date  . Anemia  h/o of  . Arthritis    in knees  . Asthma   . Breast cancer ([place])   . Bronchitis   . COPD (chronic obstructive pulmonary disease) ([place])   . Diverticulosis 11/15/2012    noted on screening colonoscopy   . Dyspnea    with COPD exerbation  . Esophageal stricture   . Former smoker 03/19/2011  . GERD (gastroesophageal reflux disease)   . Heart murmur    asymptomatic   . History of  laryngectomy   . History of radiation therapy 11/15/18- 12/06/18   Right Breast total dose 42.56 Gy in 16 fractions.   [name] Hx of radiation therapy 09/03/10 to 10/16/2010   supraglottic larynx  . Hypertension   . 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 Internal hemorrhoid 11/15/2012    small, noted on screening colonoscopy   . 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  . Leukocytopenia   . Nausea alone 07/28/2013  . Neck pain 01/21/2012  . Normal MRI 07/14/11   negative for mestasis   . Pneumonia 2012  . Sciatica   . Seizures ([place])    07/24/11 off Effexor w/o seizure  . Sepsis ([place]) 08/04/12  . Sinusitis, chronic 07/20/2011   Bilateral maxillary, identified on MRI of head 07/14/11.    . Tracheostomy dependent [facility])     Past Surgical History:  Procedure Laterality Date  . BREAST LUMPECTOMY Right 07/06/2018  . BREAST LUMPECTOMY WITH RADIOACTIVE SEED LOCALIZATION Right 07/06/2018   Procedure: RIGHT BREAST LUMPECTOMY WITH RADIOACTIVE SEED LOCALIZATION;  Surgeon: Rolm Bookbinder, MD;  Location: [facility];  Service: General;  Laterality: Right;  . COLONOSCOPY N/A 11/15/2012   Procedure: COLONOSCOPY;  Surgeon: Lafayette Dragon, MD;  [facility];  Service: Endoscopy;  Laterality: N/A;  . DENTAL RESTORATION/EXTRACTION WITH X-RAY    . ESOPHAGEAL DILATION N/A 09/09/2019   Procedure: ESOPHAGEAL DILATION;  Surgeon: [name], MD;  Location: [facility];  Service: ENT;  Laterality: N/A;  . ESOPHAGEAL DILATION N/A 10/05/2019   Procedure: ESOPHAGEAL DILATION;  Surgeon: [name], MD;  Location: [facility];  Service: ENT;  Laterality: N/A;  via tracheostomy  . ESOPHAGOGASTRODUODENOSCOPY (EGD) WITH PROPOFOL N/A 08/12/2018   Procedure: ESOPHAGOGASTRODUODENOSCOPY (EGD) WITH PROPOFOL;  Surgeon: Doran Stabler, MD;  Location: [facility];  Service: Gastroenterology;  Laterality: N/A;  . ESOPHAGOSCOPY  06/21/2012   Procedure:  ESOPHAGOSCOPY;  Surgeon: [name], MD;  Location: [facility];  Service: ENT;  Laterality: N/A;  . ESOPHAGOSCOPY WITH DILITATION N/A 09/21/2014   Procedure: ESOPHAGOSCOPY WITH DILITATION;  Surgeon: [name], MD;  Location: [facility];  Service: ENT;  Laterality: N/A;  . ESOPHAGOSCOPY WITH DILITATION N/A 07/04/2016   Procedure: ESOPHAGOSCOPY WITH DILITATION;  Surgeon: [name], MD;  Location: [facility];  Service: ENT;  Laterality: N/A;  . ESOPHAGOSCOPY WITH DILITATION N/A 12/01/2017   Procedure: ESOPHAGOSCOPY WITH DILITATION;  Surgeon: [name], MD;  Location: [facility];  Service: ENT;  Laterality: N/A;  . ESOPHAGOSCOPY WITH DILITATION N/A 04/19/2018   Procedure: ESOPHAGOSCOPY WITH DILITATION;  Surgeon: [name], MD;  Location: [facility];  Service: ENT;  Laterality: N/A;  . ESOPHAGOSCOPY WITH DILITATION N/A 03/07/2019   Procedure: Esophagoscopy with dilatation;  Surgeon: [name], MD;  Location: [facility];  Service: ENT;  Laterality: N/A;  . FLEXIBLE BRONCHOSCOPY  01/08/2018      . FOREIGN BODY REMOVAL BRONCHIAL  10/02/2011   Procedure: REMOVAL  FOREIGN BODY BRONCHIAL;  Surgeon: Ruby Cola, MD;  Location: [facility];  Service: ENT;  Laterality: N/A;  . FOREIGN BODY REMOVAL BRONCHIAL N/A 01/08/2018   Procedure: REMOVAL FOREIGN BODY BRONCHIAL;  Surgeon: Jodi Marble, MD;  [facility];  Service: ENT;  Laterality: N/A;  . LARYNGECTOMY    . Porta cath removal    . PORTACATH PLACEMENT  09/17/10   Tip in cavoatrial junction  . RE-EXCISION OF BREAST CANCER,SUPERIOR MARGINS Right 07/29/2018   Procedure: RE-EXCISION OF RIGHT BREAST MEDIAL MARGINS;  Surgeon: Rolm Bookbinder, MD;  Location: [facility];  Service: General;  Laterality: Right;  . RIGID ESOPHAGOSCOPY N/A 03/19/2019   Procedure: FLEXIBLE ESOPHAGOSCOPY;  Surgeon: Jodi Marble, MD;  Location: [facility];  Service: ENT;  Laterality: N/A;  . STOMAPLASTY N/A 10/21/2016   Procedure: Zola Button;  Surgeon: [name], MD;   Location: [facility];  Service: ENT;  Laterality: N/A;  . TRACHEAL DILITATION  07/16/2011   Procedure: TRACHEAL DILITATION;  Surgeon: Beckie Salts, MD;  Location: [facility];  Service: ENT;  Laterality: N/A;  dilation of tracheal stoma and replacement of stoma tube  . TUBAL LIGATION  1982    MEDICATIONS: . albuterol (PROVENTIL) (2.5 MG/3ML) 0.083% nebulizer solution  . amLODipine (NORVASC) 10 MG tablet  . anastrozole (ARIMIDEX) 1 MG tablet  . benzonatate (TESSALON) 100 MG capsule  . buPROPion (WELLBUTRIN XL) 150 MG 24 hr tablet  . cetirizine (ZYRTEC) 10 MG tablet  . EUTHYROX 175 MCG tablet  . losartan (COZAAR) 100 MG tablet  . meloxicam (MOBIC) 15 MG tablet  . metoprolol tartrate (LOPRESSOR) 50 MG tablet  . mometasone-formoterol (DULERA) 200-5 MCG/ACT AERO  . nystatin cream (MYCOSTATIN)  . omeprazole (PRILOSEC) 40 MG capsule  . PROVENTIL HFA 108 (90 Base) MCG/ACT inhaler  . tiotropium (SPIRIVA) 18 MCG inhalation capsule  . TRANSDERM-SCOP, 1.5 MG, 1 MG/3DAYS   No current facility-administered medications for this encounter.    If no changes, I anticipate pt can proceed with surgery as scheduled.   Willeen Cass, FNP-BC [facility]/Anesthesiology Phone: [phone] 12/08/2019 2:54 PM

## 2019-12-08 NOTE — Progress Notes (Signed)
PCP - Addison Naegeli @ [facility] Cardiologist - na  PPM/ICD - na   Chest x-ray - na EKG - 12/08/19 Stress Test - 4/19 ECHO - 3/19 Cardiac Cath - na  Sleep Study - na   Fasting Blood Sugar - na   Blood Thinner Instructions:na Aspirin Instructions: na  ERAS Protcol - yes PRE-SURGERY Ensure given  COVID TEST- 12/08/19   Anesthesia review: trach/heart murmur  Patient denies shortness of breath, fever, cough and chest pain at PAT appointment   All instructions explained to the patient, with a verbal understanding of the material. Patient agrees to go over the instructions while at home for a better understanding. Patient also instructed to self quarantine after being tested for COVID-19. The opportunity to ask questions was provided.

## 2019-12-08 NOTE — Pre-Procedure Instructions (Signed)
[place]  12/08/2019      [facility] ([place] [place] - [address] [place] ([place]) [place] [address] Phone: [phone] Fax: [phone]    Your procedure is scheduled on June 28  Report to [facility] Entrance A at 10:45 A.M.  Call this number if you have problems the morning of surgery:  [phone]   Remember:  Do not eat after midnight the night before surgery.   You may drink clear liquids until 9:45.  Clear liquids allowed are:                    Water, Juice (non-citric and without pulp - diabetics please choose diet or no sugar options), Carbonated beverages - (diabetics please choose diet or no sugar options), Clear Tea, Black Coffee only (no creamer, milk or cream including half and half), Plain Jell-O only (diabetics please choose diet or no sugar options), Gatorade (diabetics please choose diet or no sugar options) and Plain Popsicles only                             Enhanced Recovery after Surgery for Orthopedics Enhanced Recovery after Surgery is a protocol used to improve the stress on your body and your recovery after surgery.  Patient Instructions  . The night before surgery:  o No food after midnight. ONLY clear liquids after midnight  .  [name] The day of surgery (if you do NOT have diabetes):  o Drink ONE (1) Pre-Surgery Clear Ensure as directed.   o This drink was given to you during your hospital  pre-op appointment visit. o The pre-op nurse will instruct you on the time to drink the  Pre-Surgery Ensure depending on your surgery time. o Finish the drink at the designated time by the pre-op nurse.  o Nothing else to drink after completing the  Pre-Surgery Clear Ensure.          If you have questions, please contact your surgeon's office.     Take these medicines the morning of surgery with A SIP OF WATER :              Albuterol inhaler if needed-bring to hospital             Amlodipine  (norvasc)             Anastrozole (arimidex)             Tessalon if needed             Bupropion (wellbutrin)             Cetirizine (zyrtec)             euthyrox             Metoprolol (lopressor)             dulera-bring to hospital             Omeprazole(prilosec)             proventil inhaler if needed-bring to hospital             spiriva if needed--bring to hospital        7 days prior to surgery STOP taking any Aspirin (unless otherwise instructed by your surgeon), Aleve, Naproxen, Ibuprofen, Motrin, Advil, Goody's, BC's, all herbal medications, fish oil, and all vitamins, mobic/meloxican    Do not wear  jewelry, make-up or nail polish.  Do not wear lotions, powders, or perfumes, or deodorant.  Do not shave 48 hours prior to surgery.  Men may shave face and neck.  Do not bring valuables to the hospital.  [facility] is not responsible for any belongings or valuables.  Contacts, dentures or bridgework may not be worn into surgery.  Leave your suitcase in the car.  After surgery it may be brought to your room.  For patients admitted to the hospital, discharge time will be determined by your treatment team.  Patients discharged the day of surgery will not be allowed to drive home.    Special instructions:  [place]- Preparing For Surgery  Before surgery, you can play an important role. Because skin is not sterile, your skin needs to be as free of germs as possible. You can reduce the number of germs on your skin by washing with CHG (chlorahexidine gluconate) Soap before surgery.  CHG is an antiseptic cleaner which kills germs and bonds with the skin to continue killing germs even after washing.    Oral Hygiene is also important to reduce your risk of infection.  Remember - BRUSH YOUR TEETH THE MORNING OF SURGERY WITH YOUR REGULAR TOOTHPASTE  Please do not use if you have an allergy to CHG or antibacterial soaps. If your skin becomes reddened/irritated stop using the CHG.  Do  not shave (including legs and underarms) for at least 48 hours prior to first CHG shower. It is OK to shave your face.  Please follow these instructions carefully.   1. Shower the NIGHT BEFORE SURGERY and the MORNING OF SURGERY with CHG.   2. If you chose to wash your hair, wash your hair first as usual with your normal shampoo.  3. After you shampoo, rinse your hair and body thoroughly to remove the shampoo.  4. Use CHG as you would any other liquid soap. You can apply CHG directly to the skin and wash gently with a scrungie or a clean washcloth.   5. Apply the CHG Soap to your body ONLY FROM THE NECK DOWN.  Do not use on open wounds or open sores. Avoid contact with your eyes, ears, mouth and genitals (private parts). Wash Face and genitals (private parts)  with your normal soap.  6. Wash thoroughly, paying special attention to the area where your surgery will be performed.  7. Thoroughly rinse your body with warm water from the neck down.  8. DO NOT shower/wash with your normal soap after using and rinsing off the CHG Soap.  9. Pat yourself dry with a CLEAN TOWEL.  10. Wear CLEAN PAJAMAS to bed the night before surgery, wear comfortable clothes the morning of surgery  11. Place CLEAN SHEETS on your bed the night of your first shower and DO NOT SLEEP WITH PETS.    Day of Surgery:  Do not apply any deodorants/lotions.  Please wear clean clothes to the hospital/surgery center.   Remember to brush your teeth WITH YOUR REGULAR TOOTHPASTE.    Please read over the following fact sheets that you were given. Coughing and Deep Breathing and Surgical Site Infection Prevention

## 2019-12-08 NOTE — Anesthesia Preprocedure Evaluation (Addendum)
Anesthesia Evaluation  Patient identified by MRN, date of birth, ID band Patient awake    Reviewed: Allergy & Precautions, H&P , NPO status , Patient's Chart, lab work & pertinent test results  Airway Mallampati: Trach  TM Distance: >3 FB Neck ROM: Full    Dental no notable dental hx. (+) Edentulous Upper, Edentulous Lower, Dental Advisory Given   Pulmonary asthma , COPD,  COPD inhaler, former smoker,  S/p laryngectomy   Pulmonary exam normal breath sounds clear to auscultation       Cardiovascular hypertension, Pt. on medications + DOE   Rhythm:Regular Rate:Normal     Neuro/Psych Seizures -,  Depression    GI/Hepatic Neg liver ROS, GERD  Medicated and Controlled,  Endo/Other  Hypothyroidism   Renal/GU negative Renal ROS  negative genitourinary   Musculoskeletal  (+) Arthritis , Osteoarthritis,    Abdominal   Peds  Hematology  (+) Blood dyscrasia, anemia ,   Anesthesia Other Findings   Reproductive/Obstetrics negative OB ROS                           Anesthesia Physical Anesthesia Plan  ASA: III  Anesthesia Plan: Spinal   Post-op Pain Management:  Regional for Post-op pain   Induction: Intravenous  PONV Risk Score and Plan: 3 and Ondansetron, Dexamethasone, Midazolam and Propofol infusion  Airway Management Planned: Simple Face Mask  Additional Equipment:   Intra-op Plan:   Post-operative Plan:   Informed Consent: I have reviewed the patients History and Physical, chart, labs and discussed the procedure including the risks, benefits and alternatives for the proposed anesthesia with the patient or authorized representative who has indicated his/her understanding and acceptance.     Dental advisory given  Plan Discussed with: CRNA  Anesthesia Plan Comments: (See APP note by Durel Salts, FNP)      Anesthesia Quick Evaluation

## 2019-12-09 MED ORDER — BUPIVACAINE LIPOSOME 1.3 % IJ SUSP
20.0000 mL | Freq: Once | INTRAMUSCULAR | Status: DC
  Filled 2019-12-09: qty 20

## 2019-12-09 MED ORDER — TRANEXAMIC ACID 1000 MG/10ML IV SOLN
2000.0000 mg | INTRAVENOUS | Status: DC
  Filled 2019-12-09 (×2): qty 20

## 2019-12-10 ENCOUNTER — Other Ambulatory Visit: Payer: Self-pay | Admitting: Family Medicine

## 2019-12-10 DIAGNOSIS — J302 Other seasonal allergic rhinitis: Secondary | ICD-10-CM

## 2019-12-10 DIAGNOSIS — E039 Hypothyroidism, unspecified: Secondary | ICD-10-CM

## 2019-12-11 ENCOUNTER — Other Ambulatory Visit: Payer: Self-pay | Admitting: Orthopaedic Surgery

## 2019-12-11 DIAGNOSIS — M1711 Unilateral primary osteoarthritis, right knee: Secondary | ICD-10-CM

## 2019-12-11 MED ORDER — ONDANSETRON HCL 4 MG PO TABS: 4.0000 mg | ORAL_TABLET | Freq: Three times a day (TID) | ORAL | 0 refills | Status: DC | PRN

## 2019-12-11 MED ORDER — ASPIRIN EC 81 MG PO TBEC: 81.0000 mg | DELAYED_RELEASE_TABLET | Freq: Two times a day (BID) | ORAL | 0 refills | Status: DC

## 2019-12-11 MED ORDER — METHOCARBAMOL 750 MG PO TABS
750.0000 mg | ORAL_TABLET | Freq: Two times a day (BID) | ORAL | 3 refills | Status: DC | PRN
Start: 2019-12-11 — End: 2020-01-24

## 2019-12-11 MED ORDER — SENNOSIDES-DOCUSATE SODIUM 8.6-50 MG PO TABS: 1.0000 | ORAL_TABLET | Freq: Every evening | ORAL | 1 refills | Status: DC | PRN

## 2019-12-11 MED ORDER — OXYCODONE-ACETAMINOPHEN 5-325 MG PO TABS: 1.0000 | ORAL_TABLET | Freq: Three times a day (TID) | ORAL | 0 refills | Status: DC | PRN

## 2019-12-11 NOTE — Discharge Instructions (Signed)

## 2019-12-12 ENCOUNTER — Ambulatory Visit (HOSPITAL_COMMUNITY): Payer: Medicaid Other | Admitting: Emergency Medicine

## 2019-12-12 ENCOUNTER — Other Ambulatory Visit: Payer: Self-pay

## 2019-12-12 ENCOUNTER — Observation Stay (HOSPITAL_COMMUNITY)
Admission: RE | Admit: 2019-12-12 | Discharge: 2019-12-13 | Disposition: A | Discharge status: Home / Self Care | Payer: Medicaid Other | Attending: Orthopaedic Surgery | Admitting: Orthopaedic Surgery

## 2019-12-12 ENCOUNTER — Observation Stay (HOSPITAL_COMMUNITY): Payer: Medicaid Other

## 2019-12-12 ENCOUNTER — Encounter (HOSPITAL_COMMUNITY)
Admission: RE | Disposition: A | Discharge status: Home / Self Care | Payer: Self-pay | Source: Home / Self Care | Attending: Orthopaedic Surgery

## 2019-12-12 ENCOUNTER — Ambulatory Visit (HOSPITAL_COMMUNITY): Payer: Medicaid Other | Admitting: Certified Registered Nurse Anesthetist

## 2019-12-12 ENCOUNTER — Encounter (HOSPITAL_COMMUNITY): Payer: Self-pay | Admitting: Orthopaedic Surgery

## 2019-12-12 DIAGNOSIS — K219 Gastro-esophageal reflux disease without esophagitis: Secondary | ICD-10-CM | POA: Diagnosis not present

## 2019-12-12 DIAGNOSIS — M1990 Unspecified osteoarthritis, unspecified site: Secondary | ICD-10-CM | POA: Diagnosis not present

## 2019-12-12 DIAGNOSIS — Z96651 Presence of right artificial knee joint: Secondary | ICD-10-CM | POA: Diagnosis not present

## 2019-12-12 DIAGNOSIS — D649 Anemia, unspecified: Secondary | ICD-10-CM | POA: Diagnosis not present

## 2019-12-12 DIAGNOSIS — Z853 Personal history of malignant neoplasm of breast: Secondary | ICD-10-CM | POA: Diagnosis not present

## 2019-12-12 DIAGNOSIS — R569 Unspecified convulsions: Secondary | ICD-10-CM | POA: Diagnosis not present

## 2019-12-12 DIAGNOSIS — E039 Hypothyroidism, unspecified: Secondary | ICD-10-CM | POA: Insufficient documentation

## 2019-12-12 DIAGNOSIS — Z79899 Other long term (current) drug therapy: Secondary | ICD-10-CM | POA: Diagnosis not present

## 2019-12-12 DIAGNOSIS — J449 Chronic obstructive pulmonary disease, unspecified: Secondary | ICD-10-CM | POA: Diagnosis not present

## 2019-12-12 DIAGNOSIS — Z9002 Acquired absence of larynx: Secondary | ICD-10-CM | POA: Insufficient documentation

## 2019-12-12 DIAGNOSIS — Z8521 Personal history of malignant neoplasm of larynx: Secondary | ICD-10-CM | POA: Insufficient documentation

## 2019-12-12 DIAGNOSIS — Z471 Aftercare following joint replacement surgery: Secondary | ICD-10-CM | POA: Diagnosis not present

## 2019-12-12 DIAGNOSIS — Z930 Tracheostomy status: Secondary | ICD-10-CM | POA: Diagnosis not present

## 2019-12-12 DIAGNOSIS — F329 Major depressive disorder, single episode, unspecified: Secondary | ICD-10-CM | POA: Diagnosis not present

## 2019-12-12 DIAGNOSIS — Z87891 Personal history of nicotine dependence: Secondary | ICD-10-CM | POA: Diagnosis not present

## 2019-12-12 DIAGNOSIS — I10 Essential (primary) hypertension: Secondary | ICD-10-CM | POA: Insufficient documentation

## 2019-12-12 DIAGNOSIS — M1711 Unilateral primary osteoarthritis, right knee: Principal | ICD-10-CM

## 2019-12-12 DIAGNOSIS — Z888 Allergy status to other drugs, medicaments and biological substances status: Secondary | ICD-10-CM | POA: Diagnosis not present

## 2019-12-12 DIAGNOSIS — Z923 Personal history of irradiation: Secondary | ICD-10-CM | POA: Insufficient documentation

## 2019-12-12 DIAGNOSIS — M5430 Sciatica, unspecified side: Secondary | ICD-10-CM | POA: Insufficient documentation

## 2019-12-12 DIAGNOSIS — D638 Anemia in other chronic diseases classified elsewhere: Secondary | ICD-10-CM | POA: Diagnosis not present

## 2019-12-12 DIAGNOSIS — G8918 Other acute postprocedural pain: Secondary | ICD-10-CM | POA: Diagnosis not present

## 2019-12-12 HISTORY — PX: TOTAL KNEE ARTHROPLASTY: SHX125

## 2019-12-12 SURGERY — ARTHROPLASTY, KNEE, TOTAL
Anesthesia: Spinal | Site: Knee | Laterality: Right

## 2019-12-12 MED ORDER — FENTANYL CITRATE (PF) 100 MCG/2ML IJ SOLN: 50.0000 ug | Freq: Once | INTRAMUSCULAR | Status: AC

## 2019-12-12 MED ORDER — LOSARTAN POTASSIUM 50 MG PO TABS
100.0000 mg | ORAL_TABLET | Freq: Every day | ORAL | Status: DC
  Administered 2019-12-12 – 2019-12-13 (×2): 100 mg via ORAL
  Filled 2019-12-12 (×2): qty 2

## 2019-12-12 MED ORDER — 0.9 % SODIUM CHLORIDE (POUR BTL) OPTIME
TOPICAL | Status: DC | PRN
  Administered 2019-12-12: 1000 mL

## 2019-12-12 MED ORDER — TRANEXAMIC ACID 1000 MG/10ML IV SOLN
INTRAVENOUS | Status: DC | PRN
  Administered 2019-12-12: 2000 mg via TOPICAL

## 2019-12-12 MED ORDER — UMECLIDINIUM BROMIDE 62.5 MCG/INH IN AEPB
1.0000 | INHALATION_SPRAY | Freq: Every day | RESPIRATORY_TRACT | Status: DC | PRN
  Filled 2019-12-12: qty 7

## 2019-12-12 MED ORDER — EPHEDRINE SULFATE-NACL 50-0.9 MG/10ML-% IV SOSY
PREFILLED_SYRINGE | INTRAVENOUS | Status: DC | PRN
  Administered 2019-12-12: 5 mg via INTRAVENOUS
  Administered 2019-12-12: 10 mg via INTRAVENOUS

## 2019-12-12 MED ORDER — DEXAMETHASONE SODIUM PHOSPHATE 10 MG/ML IJ SOLN
INTRAMUSCULAR | Status: DC | PRN
  Administered 2019-12-12: 4 mg via INTRAVENOUS

## 2019-12-12 MED ORDER — BUPIVACAINE HCL (PF) 0.25 % IJ SOLN
INTRAMUSCULAR | Status: AC
  Filled 2019-12-12: qty 30

## 2019-12-12 MED ORDER — VANCOMYCIN HCL 1000 MG IV SOLR
INTRAVENOUS | Status: AC
  Filled 2019-12-12: qty 1000

## 2019-12-12 MED ORDER — TRANEXAMIC ACID-NACL 1000-0.7 MG/100ML-% IV SOLN
1000.0000 mg | INTRAVENOUS | Status: AC
  Administered 2019-12-12: 1000 mg via INTRAVENOUS

## 2019-12-12 MED ORDER — ACETAMINOPHEN 500 MG PO TABS
1000.0000 mg | ORAL_TABLET | Freq: Four times a day (QID) | ORAL | Status: AC
  Administered 2019-12-12 – 2019-12-13 (×4): 1000 mg via ORAL
  Filled 2019-12-12 (×4): qty 2

## 2019-12-12 MED ORDER — PROPOFOL 1000 MG/100ML IV EMUL
INTRAVENOUS | Status: AC
  Filled 2019-12-12: qty 200

## 2019-12-12 MED ORDER — MIDAZOLAM HCL 2 MG/2ML IJ SOLN
INTRAMUSCULAR | Status: AC
  Administered 2019-12-12: 2 mg via INTRAVENOUS
  Filled 2019-12-12: qty 2

## 2019-12-12 MED ORDER — ALBUTEROL SULFATE HFA 108 (90 BASE) MCG/ACT IN AERS: 2.0000 | INHALATION_SPRAY | RESPIRATORY_TRACT | Status: DC | PRN

## 2019-12-12 MED ORDER — HYDROMORPHONE HCL 1 MG/ML IJ SOLN: 0.5000 mg | INTRAMUSCULAR | Status: DC | PRN

## 2019-12-12 MED ORDER — CHLORHEXIDINE GLUCONATE 0.12 % MT SOLN
15.0000 mL | Freq: Once | OROMUCOSAL | Status: AC
  Administered 2019-12-12: 15 mL via OROMUCOSAL

## 2019-12-12 MED ORDER — DEXAMETHASONE SODIUM PHOSPHATE 10 MG/ML IJ SOLN
10.0000 mg | Freq: Once | INTRAMUSCULAR | Status: AC
  Administered 2019-12-13: 10 mg via INTRAVENOUS
  Filled 2019-12-12: qty 1

## 2019-12-12 MED ORDER — LACTATED RINGERS IV SOLN: INTRAVENOUS | Status: DC | PRN

## 2019-12-12 MED ORDER — HYDROMORPHONE HCL 1 MG/ML IJ SOLN: 0.2500 mg | INTRAMUSCULAR | Status: DC | PRN

## 2019-12-12 MED ORDER — ONDANSETRON HCL 4 MG PO TABS: 4.0000 mg | ORAL_TABLET | Freq: Four times a day (QID) | ORAL | Status: DC | PRN

## 2019-12-12 MED ORDER — MIDAZOLAM HCL 5 MG/5ML IJ SOLN
INTRAMUSCULAR | Status: DC | PRN
  Administered 2019-12-12 (×2): 1 mg via INTRAVENOUS

## 2019-12-12 MED ORDER — ONDANSETRON HCL 4 MG/2ML IJ SOLN: 4.0000 mg | Freq: Four times a day (QID) | INTRAMUSCULAR | Status: DC | PRN

## 2019-12-12 MED ORDER — ONDANSETRON HCL 4 MG/2ML IJ SOLN
INTRAMUSCULAR | Status: DC | PRN
  Administered 2019-12-12: 4 mg via INTRAVENOUS

## 2019-12-12 MED ORDER — VANCOMYCIN HCL 1000 MG IV SOLR
INTRAVENOUS | Status: DC | PRN
  Administered 2019-12-12: 1000 mg via TOPICAL

## 2019-12-12 MED ORDER — METOCLOPRAMIDE HCL 5 MG/ML IJ SOLN: 5.0000 mg | Freq: Three times a day (TID) | INTRAMUSCULAR | Status: DC | PRN

## 2019-12-12 MED ORDER — CEFAZOLIN SODIUM-DEXTROSE 2-4 GM/100ML-% IV SOLN
2.0000 g | Freq: Four times a day (QID) | INTRAVENOUS | Status: AC
  Administered 2019-12-12 – 2019-12-13 (×3): 2 g via INTRAVENOUS
  Filled 2019-12-12 (×3): qty 100

## 2019-12-12 MED ORDER — CEFAZOLIN SODIUM-DEXTROSE 2-4 GM/100ML-% IV SOLN
2.0000 g | INTRAVENOUS | Status: AC
  Administered 2019-12-12: 2 g via INTRAVENOUS

## 2019-12-12 MED ORDER — MAGNESIUM CITRATE PO SOLN: 1.0000 | Freq: Once | ORAL | Status: DC | PRN

## 2019-12-12 MED ORDER — ONDANSETRON HCL 4 MG/2ML IJ SOLN
INTRAMUSCULAR | Status: AC
  Filled 2019-12-12: qty 2

## 2019-12-12 MED ORDER — ASPIRIN EC 81 MG PO TBEC
81.0000 mg | DELAYED_RELEASE_TABLET | Freq: Two times a day (BID) | ORAL | Status: DC
  Administered 2019-12-12 – 2019-12-13 (×2): 81 mg via ORAL
  Filled 2019-12-12 (×2): qty 1

## 2019-12-12 MED ORDER — SODIUM CHLORIDE 0.9 % IR SOLN
Status: DC | PRN
  Administered 2019-12-12: 3000 mL

## 2019-12-12 MED ORDER — KETOROLAC TROMETHAMINE 30 MG/ML IJ SOLN
INTRAMUSCULAR | Status: AC
  Administered 2019-12-12: 30 mg
  Filled 2019-12-12: qty 1

## 2019-12-12 MED ORDER — SODIUM CHLORIDE 0.9% FLUSH
INTRAVENOUS | Status: DC | PRN
  Administered 2019-12-12 (×2): 10 mL via INTRADERMAL

## 2019-12-12 MED ORDER — ORAL CARE MOUTH RINSE: 15.0000 mL | Freq: Once | OROMUCOSAL | Status: AC

## 2019-12-12 MED ORDER — METOPROLOL TARTRATE 50 MG PO TABS
50.0000 mg | ORAL_TABLET | Freq: Two times a day (BID) | ORAL | Status: DC
  Administered 2019-12-12 – 2019-12-13 (×2): 50 mg via ORAL
  Filled 2019-12-12 (×2): qty 1

## 2019-12-12 MED ORDER — METOCLOPRAMIDE HCL 5 MG PO TABS: 5.0000 mg | ORAL_TABLET | Freq: Three times a day (TID) | ORAL | Status: DC | PRN

## 2019-12-12 MED ORDER — ACETAMINOPHEN 500 MG PO TABS
1000.0000 mg | ORAL_TABLET | Freq: Once | ORAL | Status: AC
  Administered 2019-12-12: 1000 mg via ORAL
  Filled 2019-12-12: qty 2

## 2019-12-12 MED ORDER — MOMETASONE FURO-FORMOTEROL FUM 200-5 MCG/ACT IN AERO
2.0000 | INHALATION_SPRAY | Freq: Two times a day (BID) | RESPIRATORY_TRACT | Status: DC
  Administered 2019-12-12: 2 via RESPIRATORY_TRACT
  Filled 2019-12-12: qty 8.8

## 2019-12-12 MED ORDER — SORBITOL 70 % SOLN: 30.0000 mL | Freq: Every day | Status: DC | PRN

## 2019-12-12 MED ORDER — TRANEXAMIC ACID-NACL 1000-0.7 MG/100ML-% IV SOLN
INTRAVENOUS | Status: AC
  Filled 2019-12-12: qty 100

## 2019-12-12 MED ORDER — ANASTROZOLE 1 MG PO TABS
1.0000 mg | ORAL_TABLET | Freq: Every day | ORAL | Status: DC
  Administered 2019-12-13: 1 mg via ORAL
  Filled 2019-12-12: qty 1

## 2019-12-12 MED ORDER — SODIUM CHLORIDE 0.9 % IV SOLN: INTRAVENOUS | Status: DC

## 2019-12-12 MED ORDER — DOCUSATE SODIUM 100 MG PO CAPS
100.0000 mg | ORAL_CAPSULE | Freq: Two times a day (BID) | ORAL | Status: DC
  Administered 2019-12-12 – 2019-12-13 (×2): 100 mg via ORAL
  Filled 2019-12-12 (×2): qty 1

## 2019-12-12 MED ORDER — POLYETHYLENE GLYCOL 3350 17 G PO PACK: 17.0000 g | PACK | Freq: Every day | ORAL | Status: DC | PRN

## 2019-12-12 MED ORDER — KETOROLAC TROMETHAMINE 15 MG/ML IJ SOLN
30.0000 mg | Freq: Four times a day (QID) | INTRAMUSCULAR | Status: AC
  Administered 2019-12-12 – 2019-12-13 (×4): 30 mg via INTRAVENOUS
  Filled 2019-12-12 (×5): qty 2

## 2019-12-12 MED ORDER — PHENOL 1.4 % MT LIQD: 1.0000 | OROMUCOSAL | Status: DC | PRN

## 2019-12-12 MED ORDER — CELECOXIB 200 MG PO CAPS
200.0000 mg | ORAL_CAPSULE | Freq: Two times a day (BID) | ORAL | Status: DC
  Administered 2019-12-12 – 2019-12-13 (×2): 200 mg via ORAL
  Filled 2019-12-12 (×4): qty 1

## 2019-12-12 MED ORDER — BUPIVACAINE-EPINEPHRINE (PF) 0.5% -1:200000 IJ SOLN
INTRAMUSCULAR | Status: DC | PRN
  Administered 2019-12-12: 20 mL via PERINEURAL

## 2019-12-12 MED ORDER — GABAPENTIN 300 MG PO CAPS
300.0000 mg | ORAL_CAPSULE | Freq: Three times a day (TID) | ORAL | Status: DC
  Administered 2019-12-12 – 2019-12-13 (×3): 300 mg via ORAL
  Filled 2019-12-12 (×3): qty 1

## 2019-12-12 MED ORDER — BUPIVACAINE IN DEXTROSE 0.75-8.25 % IT SOLN
INTRATHECAL | Status: DC | PRN
Start: 2019-12-12 — End: 2019-12-12
  Administered 2019-12-12: 1.6 mL via INTRATHECAL

## 2019-12-12 MED ORDER — BUPROPION HCL ER (XL) 150 MG PO TB24
150.0000 mg | ORAL_TABLET | Freq: Every day | ORAL | Status: DC
  Administered 2019-12-13: 150 mg via ORAL
  Filled 2019-12-12: qty 1

## 2019-12-12 MED ORDER — POVIDONE-IODINE 10 % EX SWAB
2.0000 "application " | Freq: Once | CUTANEOUS | Status: AC
  Administered 2019-12-12: 2 via TOPICAL

## 2019-12-12 MED ORDER — MIDAZOLAM HCL 2 MG/2ML IJ SOLN
INTRAMUSCULAR | Status: AC
  Filled 2019-12-12: qty 2

## 2019-12-12 MED ORDER — LEVOTHYROXINE SODIUM 75 MCG PO TABS
175.0000 ug | ORAL_TABLET | Freq: Every day | ORAL | Status: DC
  Administered 2019-12-13: 175 ug via ORAL
  Filled 2019-12-12: qty 1

## 2019-12-12 MED ORDER — OXYCODONE HCL ER 10 MG PO T12A
10.0000 mg | EXTENDED_RELEASE_TABLET | Freq: Two times a day (BID) | ORAL | Status: DC
  Administered 2019-12-12 – 2019-12-13 (×2): 10 mg via ORAL
  Filled 2019-12-12 (×2): qty 1

## 2019-12-12 MED ORDER — MIDAZOLAM HCL 2 MG/2ML IJ SOLN: 2.0000 mg | Freq: Once | INTRAMUSCULAR | Status: AC

## 2019-12-12 MED ORDER — ALUM & MAG HYDROXIDE-SIMETH 200-200-20 MG/5ML PO SUSP: 30.0000 mL | ORAL | Status: DC | PRN

## 2019-12-12 MED ORDER — AMLODIPINE BESYLATE 10 MG PO TABS
10.0000 mg | ORAL_TABLET | Freq: Every day | ORAL | Status: DC
  Administered 2019-12-13: 10 mg via ORAL
  Filled 2019-12-12: qty 1

## 2019-12-12 MED ORDER — EPHEDRINE 5 MG/ML INJ
INTRAVENOUS | Status: AC
  Filled 2019-12-12: qty 10

## 2019-12-12 MED ORDER — CEFAZOLIN SODIUM-DEXTROSE 2-4 GM/100ML-% IV SOLN
INTRAVENOUS | Status: AC
  Filled 2019-12-12: qty 100

## 2019-12-12 MED ORDER — SCOPOLAMINE 1 MG/3DAYS TD PT72
1.0000 | MEDICATED_PATCH | Freq: Every day | TRANSDERMAL | Status: DC | PRN
  Filled 2019-12-12: qty 1

## 2019-12-12 MED ORDER — OXYCODONE HCL 5 MG PO TABS: 5.0000 mg | ORAL_TABLET | ORAL | Status: DC | PRN

## 2019-12-12 MED ORDER — MENTHOL 3 MG MT LOZG: 1.0000 | LOZENGE | OROMUCOSAL | Status: DC | PRN

## 2019-12-12 MED ORDER — LACTATED RINGERS IV SOLN: INTRAVENOUS | Status: DC

## 2019-12-12 MED ORDER — ALBUTEROL SULFATE (2.5 MG/3ML) 0.083% IN NEBU: 2.5000 mg | INHALATION_SOLUTION | Freq: Four times a day (QID) | RESPIRATORY_TRACT | Status: DC | PRN

## 2019-12-12 MED ORDER — PROPOFOL 10 MG/ML IV BOLUS
INTRAVENOUS | Status: AC
  Filled 2019-12-12: qty 20

## 2019-12-12 MED ORDER — LIDOCAINE 2% (20 MG/ML) 5 ML SYRINGE
INTRAMUSCULAR | Status: AC
  Filled 2019-12-12: qty 5

## 2019-12-12 MED ORDER — BUPIVACAINE LIPOSOME 1.3 % IJ SUSP
INTRAMUSCULAR | Status: DC | PRN
  Administered 2019-12-12: 20 mL

## 2019-12-12 MED ORDER — ACETAMINOPHEN 325 MG PO TABS: 325.0000 mg | ORAL_TABLET | Freq: Four times a day (QID) | ORAL | Status: DC | PRN

## 2019-12-12 MED ORDER — BUPIVACAINE HCL (PF) 0.25 % IJ SOLN
INTRAMUSCULAR | Status: DC | PRN
  Administered 2019-12-12: 20 mL

## 2019-12-12 MED ORDER — FENTANYL CITRATE (PF) 100 MCG/2ML IJ SOLN
INTRAMUSCULAR | Status: AC
  Administered 2019-12-12: 50 ug via INTRAVENOUS
  Filled 2019-12-12: qty 2

## 2019-12-12 MED ORDER — OXYCODONE HCL 5 MG PO TABS
ORAL_TABLET | ORAL | Status: AC
  Administered 2019-12-12: 10 mg via ORAL
  Filled 2019-12-12: qty 2

## 2019-12-12 MED ORDER — DEXAMETHASONE SODIUM PHOSPHATE 10 MG/ML IJ SOLN
INTRAMUSCULAR | Status: AC
  Filled 2019-12-12: qty 1

## 2019-12-12 MED ORDER — FENTANYL CITRATE (PF) 250 MCG/5ML IJ SOLN
INTRAMUSCULAR | Status: AC
  Filled 2019-12-12: qty 5

## 2019-12-12 MED ORDER — DIPHENHYDRAMINE HCL 12.5 MG/5ML PO ELIX: 25.0000 mg | ORAL_SOLUTION | ORAL | Status: DC | PRN

## 2019-12-12 MED ORDER — OXYCODONE HCL 5 MG PO TABS: 10.0000 mg | ORAL_TABLET | ORAL | Status: DC | PRN

## 2019-12-12 SURGICAL SUPPLY — 76 items
ALCOHOL 70% 16 OZ (MISCELLANEOUS) ×2 IMPLANT
BAG DECANTER FOR FLEXI CONT (MISCELLANEOUS) ×2 IMPLANT
BANDAGE ESMARK 6X9 LF (GAUZE/BANDAGES/DRESSINGS) IMPLANT
BLADE SAG 18X100X1.27 (BLADE) ×2 IMPLANT
BNDG ESMARK 6X9 LF (GAUZE/BANDAGES/DRESSINGS)
BOWL SMART MIX CTS (DISPOSABLE) ×2 IMPLANT
CEMENT BONE REFOBACIN R1X40 US (Cement) ×4 IMPLANT
CLSR STERI-STRIP ANTIMIC 1/2X4 (GAUZE/BANDAGES/DRESSINGS) ×2 IMPLANT
COVER SURGICAL LIGHT HANDLE (MISCELLANEOUS) ×2 IMPLANT
COVER WAND RF STERILE (DRAPES) IMPLANT
CUFF TOURN SGL QUICK 34 (TOURNIQUET CUFF) ×2
CUFF TOURN SGL QUICK 42 (TOURNIQUET CUFF) IMPLANT
CUFF TRNQT CYL 34X4.125X (TOURNIQUET CUFF) ×1 IMPLANT
DERMABOND ADVANCED (GAUZE/BANDAGES/DRESSINGS) ×1
DERMABOND ADVANCED .7 DNX12 (GAUZE/BANDAGES/DRESSINGS) ×1 IMPLANT
DRAPE EXTREMITY T 121X128X90 (DISPOSABLE) ×2 IMPLANT
DRAPE HALF SHEET 40X57 (DRAPES) ×2 IMPLANT
DRAPE INCISE IOBAN 66X45 STRL (DRAPES) IMPLANT
DRAPE ORTHO SPLIT 77X108 STRL (DRAPES) ×4
DRAPE POUCH INSTRU U-SHP 10X18 (DRAPES) ×2 IMPLANT
DRAPE SURG ORHT 6 SPLT 77X108 (DRAPES) ×2 IMPLANT
DRAPE U-SHAPE 47X51 STRL (DRAPES) ×4 IMPLANT
DRSG AQUACEL AG ADV 3.5X10 (GAUZE/BANDAGES/DRESSINGS) ×2 IMPLANT
DURAPREP 26ML APPLICATOR (WOUND CARE) ×6 IMPLANT
ELECT CAUTERY BLADE 6.4 (BLADE) ×2 IMPLANT
ELECT REM PT RETURN 9FT ADLT (ELECTROSURGICAL) ×2
ELECTRODE REM PT RTRN 9FT ADLT (ELECTROSURGICAL) ×1 IMPLANT
FEMUR CMT CR STD SZ 8 RT KNEE (Joint) ×2 IMPLANT
FEMUR CMTD CR STD SZ 8 RT KNEE (Joint) ×1 IMPLANT
GLOVE BIOGEL PI IND STRL 7.0 (GLOVE) ×1 IMPLANT
GLOVE BIOGEL PI INDICATOR 7.0 (GLOVE) ×1
GLOVE ECLIPSE 7.0 STRL STRAW (GLOVE) ×6 IMPLANT
GLOVE SKINSENSE NS SZ7.5 (GLOVE) ×3
GLOVE SKINSENSE STRL SZ7.5 (GLOVE) ×3 IMPLANT
GLOVE SURG SYN 7.5  E (GLOVE) ×8
GLOVE SURG SYN 7.5 E (GLOVE) ×4 IMPLANT
GOWN STRL REIN XL XLG (GOWN DISPOSABLE) ×2 IMPLANT
GOWN STRL REUS W/ TWL LRG LVL3 (GOWN DISPOSABLE) ×1 IMPLANT
GOWN STRL REUS W/TWL LRG LVL3 (GOWN DISPOSABLE) ×2
HANDPIECE INTERPULSE COAX TIP (DISPOSABLE) ×2
HDLS TROCR DRIL PIN KNEE 75 (PIN) ×2
HOOD PEEL AWAY FLYTE STAYCOOL (MISCELLANEOUS) ×4 IMPLANT
INSERT TIB AS PERS 8-11X13 (Insert) ×2 IMPLANT
JET LAVAGE IRRISEPT WOUND (IRRIGATION / IRRIGATOR) ×2
KIT BASIN OR (CUSTOM PROCEDURE TRAY) ×2 IMPLANT
KIT TURNOVER KIT B (KITS) ×2 IMPLANT
LAVAGE JET IRRISEPT WOUND (IRRIGATION / IRRIGATOR) ×1 IMPLANT
MANIFOLD NEPTUNE II (INSTRUMENTS) ×2 IMPLANT
MARKER SKIN DUAL TIP RULER LAB (MISCELLANEOUS) ×2 IMPLANT
NEEDLE SPNL 18GX3.5 QUINCKE PK (NEEDLE) ×4 IMPLANT
NS IRRIG 1000ML POUR BTL (IV SOLUTION) ×2 IMPLANT
PACK TOTAL JOINT (CUSTOM PROCEDURE TRAY) ×2 IMPLANT
PAD ARMBOARD 7.5X6 YLW CONV (MISCELLANEOUS) ×4 IMPLANT
PIN DRILL HDLS TROCAR 75 4PK (PIN) ×1 IMPLANT
SAW OSC TIP CART 19.5X105X1.3 (SAW) ×2 IMPLANT
SCREW FEMALE HEX FIX 25X2.5 (ORTHOPEDIC DISPOSABLE SUPPLIES) ×2 IMPLANT
SET HNDPC FAN SPRY TIP SCT (DISPOSABLE) ×1 IMPLANT
STAPLER VISISTAT 35W (STAPLE) IMPLANT
STEM POLY PAT PLY 32M KNEE (Knees) ×2 IMPLANT
STEM TIBIA 5 DEG SZ E R KNEE (Knees) ×1 IMPLANT
SUCTION FRAZIER HANDLE 10FR (MISCELLANEOUS) ×2
SUCTION TUBE FRAZIER 10FR DISP (MISCELLANEOUS) ×1 IMPLANT
SUT ETHILON 2 0 FS 18 (SUTURE) IMPLANT
SUT MNCRL AB 4-0 PS2 18 (SUTURE) IMPLANT
SUT VIC AB 0 CT1 27 (SUTURE) ×4
SUT VIC AB 0 CT1 27XBRD ANBCTR (SUTURE) ×2 IMPLANT
SUT VIC AB 1 CTX 27 (SUTURE) ×6 IMPLANT
SUT VIC AB 2-0 CT1 27 (SUTURE) ×8
SUT VIC AB 2-0 CT1 TAPERPNT 27 (SUTURE) ×4 IMPLANT
SYR 50ML LL SCALE MARK (SYRINGE) ×4 IMPLANT
TIBIA STEM 5 DEG SZ E R KNEE (Knees) ×2 IMPLANT
TOWEL GREEN STERILE (TOWEL DISPOSABLE) ×2 IMPLANT
TOWEL GREEN STERILE FF (TOWEL DISPOSABLE) ×2 IMPLANT
TRAY CATH 16FR W/PLASTIC CATH (SET/KITS/TRAYS/PACK) IMPLANT
UNDERPAD 30X36 HEAVY ABSORB (UNDERPADS AND DIAPERS) ×2 IMPLANT
WRAP KNEE MAXI GEL POST OP (GAUZE/BANDAGES/DRESSINGS) ×2 IMPLANT

## 2019-12-12 NOTE — Anesthesia Procedure Notes (Signed)
Anesthesia Regional Block: Adductor canal block   Pre-Anesthetic Checklist: ,, timeout performed, Correct Patient, Correct Site, Correct Laterality, Correct Procedure, Correct Position, site marked, Risks and benefits discussed, pre-op evaluation,  At surgeon's request and post-op pain management  Laterality: Right  Prep: Maximum Sterile Barrier Precautions used, chloraprep       Needles:  Injection technique: Single-shot  Needle Type: Echogenic Stimulator Needle     Needle Length: 9cm  Needle Gauge: 21     Additional Needles:   Procedures:,,,, ultrasound used (permanent image in chart),,,,  Narrative:  Start time: 12/12/2019 11:27 AM End time: 12/12/2019 11:37 AM Injection made incrementally with aspirations every 5 mL.  Performed by: Personally  Anesthesiologist: Roderic Palau, MD  Additional Notes: 2% Lidocaine skin wheel.

## 2019-12-12 NOTE — Anesthesia Procedure Notes (Signed)
Spinal  Patient location during procedure: OR Start time: 12/12/2019 12:59 PM End time: 12/12/2019 1:02 PM Staffing Performed: anesthesiologist  Anesthesiologist: Roderic Palau, MD Preanesthetic Checklist Completed: patient identified, IV checked, risks and benefits discussed, surgical consent, monitors and equipment checked, pre-op evaluation and timeout performed Spinal Block Patient position: sitting Prep: DuraPrep Patient monitoring: cardiac monitor, continuous pulse ox and blood pressure Approach: midline Location: L3-4 Injection technique: single-shot Needle Needle type: Pencan  Needle gauge: 24 G Needle length: 9 cm Assessment Sensory level: T8 Additional Notes Functioning IV was confirmed and monitors were applied. Sterile prep and drape, including hand hygiene and sterile gloves were used. The patient was positioned and the spine was prepped. The skin was anesthetized with lidocaine.  Free flow of clear CSF was obtained prior to injecting local anesthetic into the CSF.  The spinal needle aspirated freely following injection.  The needle was carefully withdrawn.  The patient tolerated the procedure well.

## 2019-12-12 NOTE — Transfer of Care (Signed)
Immediate Anesthesia Transfer of Care Note  Patient: Deborah [name]  Procedure(s) Performed: RIGHT TOTAL KNEE ARTHROPLASTY (Right Knee)  Patient Location: PACU  Anesthesia Type:Spinal  Level of Consciousness: awake, alert  and oriented  Airway & Oxygen Therapy: Patient Spontanous Breathing  Post-op Assessment: Report given to RN and Post -op Vital signs reviewed and stable  Post vital signs: Reviewed and stable  Last Vitals:  Vitals Value Taken Time  BP 129/94 12/12/19 1500  Temp    Pulse 72 12/12/19 1505  Resp 16 12/12/19 1505  SpO2 95 % 12/12/19 1505  Vitals shown include unvalidated device data.  Last Pain:  Vitals:   12/12/19 1145  TempSrc:   PainSc: 0-No pain         Complications: No complications documented.

## 2019-12-12 NOTE — Progress Notes (Signed)
Orthopedic Tech Progress Note Patient Details:  Deborah [name] [facility] 1955-10-17 [id number]  CPM Right Knee CPM Right Knee: On Right Knee Flexion (Degrees): 0 Right Knee Extension (Degrees): 70  Post Interventions Patient Tolerated: Well Instructions Provided: Care of device  Janit Pagan 12/12/2019, 4:44 PM

## 2019-12-12 NOTE — Plan of Care (Signed)
  Problem: Education: Goal: Knowledge of General Education information will improve Description Including pain rating scale, medication(s)/side effects and non-pharmacologic comfort measures Outcome: Progressing   

## 2019-12-12 NOTE — H&P (Signed)
PREOPERATIVE H&P  Chief Complaint: right knee degenerative joint disease  HPI: Deborah [name] is a 64 y.o. female who presents for surgical treatment of right knee degenerative joint disease.  She denies any changes in medical history.  Past Medical History:  Diagnosis Date  . Anemia    h/o of  . Arthritis    in knees  . Asthma   . Breast cancer ([place])   . Bronchitis   . COPD (chronic obstructive pulmonary disease) ([place])   . Diverticulosis 11/15/2012    noted on screening colonoscopy   . Dyspnea    with COPD exerbation  . Esophageal stricture   . Former smoker 03/19/2011  . GERD (gastroesophageal reflux disease)   . Heart murmur    asymptomatic   . History of laryngectomy   . History of radiation therapy 11/15/18- 12/06/18   Right Breast total dose 42.56 Gy in 16 fractions.   [name] Hx of radiation therapy 09/03/10 to 10/16/2010   supraglottic larynx  . Hypertension   . 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 Internal hemorrhoid 11/15/2012    small, noted on screening colonoscopy   . 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  . Leukocytopenia   . Nausea alone 07/28/2013  . Neck pain 01/21/2012  . Normal MRI 07/14/11   negative for mestasis   . Pneumonia 2012  . Sciatica   . Seizures ([place])    07/24/11 off Effexor w/o seizure  . Sepsis ([place]) 08/04/12  . Sinusitis, chronic 07/20/2011   Bilateral maxillary, identified on MRI of head 07/14/11.    . Tracheostomy dependent [facility])    Past Surgical History:  Procedure Laterality Date  . BREAST LUMPECTOMY Right 07/06/2018  . BREAST LUMPECTOMY WITH RADIOACTIVE SEED LOCALIZATION Right 07/06/2018   Procedure: RIGHT BREAST LUMPECTOMY WITH RADIOACTIVE SEED LOCALIZATION;  Surgeon: Rolm Bookbinder, MD;  Location: [place];  Service: General;  Laterality: Right;  . COLONOSCOPY N/A 11/15/2012   Procedure: COLONOSCOPY;  Surgeon: Lafayette Dragon, MD;  Location: [facility];  Service: Endoscopy;   Laterality: N/A;  . DENTAL RESTORATION/EXTRACTION WITH X-RAY    . ESOPHAGEAL DILATION N/A 09/09/2019   Procedure: ESOPHAGEAL DILATION;  Surgeon: [name], MD;  Location: [place];  Service: ENT;  Laterality: N/A;  . ESOPHAGEAL DILATION N/A 10/05/2019   Procedure: ESOPHAGEAL DILATION;  Surgeon: [name], MD;  Location: [place];  Service: ENT;  Laterality: N/A;  via tracheostomy  . ESOPHAGOGASTRODUODENOSCOPY (EGD) WITH PROPOFOL N/A 08/12/2018   Procedure: ESOPHAGOGASTRODUODENOSCOPY (EGD) WITH PROPOFOL;  Surgeon: Doran Stabler, MD;  Location: [place];  Service: Gastroenterology;  Laterality: N/A;  . ESOPHAGOSCOPY  06/21/2012   Procedure: ESOPHAGOSCOPY;  Surgeon: [name], MD;  Location: [place];  Service: ENT;  Laterality: N/A;  . ESOPHAGOSCOPY WITH DILITATION N/A 09/21/2014   Procedure: ESOPHAGOSCOPY WITH DILITATION;  Surgeon: [name], MD;  Location: [place];  Service: ENT;  Laterality: N/A;  . ESOPHAGOSCOPY WITH DILITATION N/A 07/04/2016   Procedure: ESOPHAGOSCOPY WITH DILITATION;  Surgeon: [name], MD;  Location: [facility] OR;  Service: ENT;  Laterality: N/A;  . ESOPHAGOSCOPY WITH DILITATION N/A 12/01/2017   Procedure: ESOPHAGOSCOPY WITH DILITATION;  Surgeon: [name], MD;  Location: [place];  Service: ENT;  Laterality: N/A;  . ESOPHAGOSCOPY WITH DILITATION N/A 04/19/2018   Procedure: ESOPHAGOSCOPY WITH DILITATION;  Surgeon: [name], MD;  Location: [place];  Service: ENT;  Laterality: N/A;  .  ESOPHAGOSCOPY WITH DILITATION N/A 03/07/2019   Procedure: Esophagoscopy with dilatation;  Surgeon: [name], MD;  Location: [place];  Service: ENT;  Laterality: N/A;  . FLEXIBLE BRONCHOSCOPY  01/08/2018      . FOREIGN BODY REMOVAL BRONCHIAL  10/02/2011   Procedure: REMOVAL FOREIGN BODY BRONCHIAL;  Surgeon: Ruby Cola, MD;  Location: [place];  Service: ENT;  Laterality: N/A;  . FOREIGN BODY REMOVAL BRONCHIAL N/A  01/08/2018   Procedure: REMOVAL FOREIGN BODY BRONCHIAL;  Surgeon: Jodi Marble, MD;  Location[facility];  Service: ENT;  Laterality: N/A;  . LARYNGECTOMY    . Porta cath removal    . PORTACATH PLACEMENT  09/17/10   Tip in cavoatrial junction  . RE-EXCISION OF BREAST CANCER,SUPERIOR MARGINS Right 07/29/2018   Procedure: RE-EXCISION OF RIGHT BREAST MEDIAL MARGINS;  Surgeon: Rolm Bookbinder, MD;  Location: [place];  Service: General;  Laterality: Right;  . RIGID ESOPHAGOSCOPY N/A 03/19/2019   Procedure: FLEXIBLE ESOPHAGOSCOPY;  Surgeon: Jodi Marble, MD;  Location: [place];  Service: ENT;  Laterality: N/A;  . STOMAPLASTY N/A 10/21/2016   Procedure: Zola Button;  Surgeon: [name], MD;  Location: [place];  Service: ENT;  Laterality: N/A;  . TRACHEAL DILITATION  07/16/2011   Procedure: TRACHEAL DILITATION;  Surgeon: Beckie Salts, MD;  Location: [place];  Service: ENT;  Laterality: N/A;  dilation of tracheal stoma and replacement of stoma tube  . TUBAL LIGATION  1982   Social History   Socioeconomic History  . Marital status: Divorced    Spouse name: Not on file  . Number of children: 2  . Years of education: 51  . Highest education level: Not on file  Occupational History  . Occupation: part time    Comment: warehouse work  Tobacco Use  . Smoking status: Former Smoker    Packs/day: 2.00    Years: 20.00    Pack years: 40.00    Types: Cigarettes    Quit date: 09/17/2010    Years since quitting: 9.2  . Smokeless tobacco: Never Used  Vaping Use  . Vaping Use: Never used  Substance and Sexual Activity  . Alcohol use: Yes    Comment: Socially drinks   . Drug use: No    Comment: tried cocaine 1 time 2010 only used 1 time  . Sexual activity: Yes    Birth control/protection: Surgical  Other Topics Concern  . Not on file  Social History Narrative   Lives with boyfriend   Social Determinants of Health   Financial Resource Strain:   . Difficulty of Paying Living Expenses:   Food  Insecurity:   . Worried About Charity fundraiser in the Last Year:   . Arboriculturist in the Last Year:   Transportation Needs:   . Film/video editor (Medical):   [name] Lack of Transportation (Non-Medical):   Physical Activity:   . Days of Exercise per Week:   . Minutes of Exercise per Session:   Stress:   . Feeling of Stress :   Social Connections:   . Frequency of Communication with Friends and Family:   . Frequency of Social Gatherings with Friends and Family:   . Attends Religious Services:   . Active Member of Clubs or Organizations:   . Attends Archivist Meetings:   [name] Marital Status:    Family History  Problem Relation Age of Onset  . Heart disease Mother   . Heart disease Father   .  Heart disease Sister   . Cancer Brother        type unknown  . Liver disease Maternal Grandmother   . Cancer Sister        type unknown  . Diabetes Sister   . Breast cancer Neg Hx    Allergies  Allergen Reactions  . Lisinopril Cough   Prior to Admission medications   Medication Sig Start Date End Date Taking? Authorizing Provider  albuterol (PROVENTIL) (2.5 MG/3ML) 0.083% nebulizer solution Take 3 mLs (2.5 mg total) by nebulization every 6 (six) hours as needed for wheezing or shortness of breath. 08/09/19  Yes Benay Pike, MD  amLODipine (NORVASC) 10 MG tablet Take 1 tablet (10 mg total) by mouth daily. 08/09/19  Yes Benay Pike, MD  anastrozole (ARIMIDEX) 1 MG tablet Take 1 tablet by mouth daily Patient taking differently: Take 1 mg by mouth daily.  08/15/19  Yes Nicholas Lose, MD  benzonatate (TESSALON) 100 MG capsule TAKE 1 CAPSULE BY MOUTH TWICE DAILY AS NEEDED FOR COUGH Patient taking differently: Take 100 mg by mouth 2 (two) times daily as needed for cough.  11/25/19  Yes Benay Pike, MD  buPROPion (WELLBUTRIN XL) 150 MG 24 hr tablet Take 1 tablet (150 mg total) by mouth daily. 08/09/19  Yes Benay Pike, MD  cetirizine (ZYRTEC) 10 MG tablet Take 1 tablet  by mouth once daily Patient taking differently: Take 10 mg by mouth daily.  11/25/19  Yes Benay Pike, MD  EUTHYROX 175 MCG tablet TAKE 1 TABLET BY MOUTH ONCE DAILY BEFORE BREAKFAST Patient taking differently: Take 175 mcg by mouth daily before breakfast.  10/17/19  Yes Benay Pike, MD  losartan (COZAAR) 100 MG tablet Take 1 tablet (100 mg total) by mouth daily. 08/09/19  Yes Benay Pike, MD  meloxicam (MOBIC) 15 MG tablet TAKE 1 TABLET BY MOUTH  DAILY AS NEEDED Patient taking differently: Take 15 mg by mouth daily as needed for pain.  09/20/19  Yes Benay Pike, MD  metoprolol tartrate (LOPRESSOR) 50 MG tablet Take 1 tablet (50 mg total) by mouth 2 (two) times daily. 07/20/19  Yes Matilde Haymaker, MD  mometasone-formoterol [facility]) 200-5 MCG/ACT AERO Inhale 2 puffs into the lungs 2 (two) times daily. Patient taking differently: Inhale 2 puffs into the lungs daily.  09/20/19  Yes Benay Pike, MD  nystatin cream (MYCOSTATIN) APPLY  CREAM TOPICALLY TWICE DAILY AS NEEDED Patient taking differently: Apply 1 application topically daily as needed for dry skin.  09/20/19  Yes Benay Pike, MD  omeprazole (PRILOSEC) 40 MG capsule Take 1 capsule (40 mg total) by mouth 2 (two) times daily. 08/09/19  Yes Benay Pike, MD  PROVENTIL HFA 108 [phone] Base) MCG/ACT inhaler INHALE 2 PUFFS BY MOUTH EVERY 4 HOURS AS NEEDED OR SHORTNESS OF BREATH Patient taking differently: Inhale 2 puffs into the lungs every 4 (four) hours as needed for wheezing or shortness of breath.  09/23/19  Yes Benay Pike, MD  tiotropium (SPIRIVA) 18 MCG inhalation capsule Place 18 mcg into inhaler and inhale daily as needed (shortness of breath).    Yes [provider]  TRANSDERM-SCOP, 1.5 MG, 1 MG/3DAYS PLACE 1 PATCH ONTO THE SKIN EVERY 3 DAYS AS NEEDED Patient taking differently: Place 1 patch onto the skin daily as needed (Nausea).  07/15/18  Yes Bufford Lope, DO  aspirin EC 81 MG tablet Take 1 tablet (81 mg total) by mouth  2 (two) times  daily. 12/11/19   Leandrew Koyanagi, MD  methocarbamol (ROBAXIN) 750 MG tablet Take 1 tablet (750 mg total) by mouth 2 (two) times daily as needed for muscle spasms. 12/11/19   Leandrew Koyanagi, MD  ondansetron (ZOFRAN) 4 MG tablet Take 1-2 tablets (4-8 mg total) by mouth every 8 (eight) hours as needed for nausea or vomiting. 12/11/19   Leandrew Koyanagi, MD  oxyCODONE-acetaminophen (PERCOCET) 5-325 MG tablet Take 1-2 tablets by mouth every 8 (eight) hours as needed for severe pain. 12/11/19   Leandrew Koyanagi, MD  senna-docusate (SENOKOT S) 8.6-50 MG tablet Take 1-2 tablets by mouth at bedtime as needed. 12/11/19   Leandrew Koyanagi, MD     Positive ROS: All other systems have been reviewed and were otherwise negative with the exception of those mentioned in the HPI and as above.  Physical Exam: General: Alert, no acute distress Cardiovascular: No pedal edema Respiratory: No cyanosis, no use of accessory musculature GI: abdomen soft Skin: No lesions in the area of chief complaint Neurologic: Sensation intact distally Psychiatric: Patient is competent for consent with normal mood and affect Lymphatic: no lymphedema  MUSCULOSKELETAL: exam stable  Assessment: right knee degenerative joint disease  Plan: Plan for Procedure(s): RIGHT TOTAL KNEE ARTHROPLASTY  The risks benefits and alternatives were discussed with the patient including but not limited to the risks of nonoperative treatment, versus surgical intervention including infection, bleeding, nerve injury,  blood clots, cardiopulmonary complications, morbidity, mortality, among others, and they were willing to proceed.   Preoperative templating of the joint replacement has been completed, documented, and submitted to the Operating Room personnel in order to optimize intra-operative equipment management.   Eduard Roux, MD 12/12/2019 6:26 AM

## 2019-12-12 NOTE — Op Note (Signed)
Total Knee Arthroplasty Procedure Note  Preoperative diagnosis: Right knee osteoarthritis  Postoperative diagnosis:same  Operative procedure: Right total knee arthroplasty. CPT [phone]  Surgeon: N. Eduard Roux, MD  Assist: April Green, RNFA  Anesthesia: Spinal, regional, local  Tourniquet time: see anesthesia record  Implants used: Zimmer persona Femur: CR 8 Tibia: E Patella: 32 mm Polyethylene: 13 mm, MC  Indication: Deborah [name] is a 64 y.o. year old female with a history of knee pain. Having failed conservative management, the patient elected to proceed with a total knee arthroplasty.  We have reviewed the risk and benefits of the surgery and they elected to proceed after voicing understanding.  Procedure:  After informed consent was obtained and understanding of the risk were voiced including but not limited to bleeding, infection, damage to surrounding structures including nerves and vessels, blood clots, leg length inequality and the failure to achieve desired results, the operative extremity was marked with verbal confirmation of the patient in the holding area.   The patient was then brought to the operating room and transported to the operating room table in the supine position.  A tourniquet was applied to the operative extremity around the upper thigh. The operative limb was then prepped and draped in the usual sterile fashion and preoperative antibiotics were administered.  A time out was performed prior to the start of surgery confirming the correct extremity, preoperative antibiotic administration, as well as team members, implants and instruments available for the case. Correct surgical site was also confirmed with preoperative radiographs. The limb was then elevated for exsanguination and the tourniquet was inflated. A midline incision was made and a standard medial parapatellar approach was performed.  The infrapatellar fat pad was removed.  Suprapatellar  synovium was removed to reveal the anterior distal femoral cortex.  A medial peel was performed to release the capsule of the medial tibial plateau.  The patella was then everted and was prepared and sized to a 32 mm.  A cover was placed on the patella for protection from retractors.  The knee was then brought into full flexion and we then turned our attention to the femur.  The cruciates were sacrificed.  Start site was drilled in the femur and the intramedullary distal femoral cutting guide was placed, set at 5 degrees valgus, taking 12 mm of distal resection. The distal cut was made. Osteophytes were then removed.  Next, the proximal tibial cutting guide was placed with appropriate slope, varus/valgus alignment and depth of resection. The proximal tibial cut was made. Gap blocks were then used to assess the extension gap and alignment, and appropriate soft tissue releases were performed. Attention was turned back to the femur, which was sized using the sizing guide to a size 8. Appropriate rotation of the femoral component was determined using epicondylar axis, Whiteside's line, and assessing the flexion gap under ligament tension. The appropriate size 4-in-1 cutting block was placed and checked with an angel wing and cuts were made. Posterior femoral osteophytes and uncapped bone were then removed with the curved osteotome.  Trial components were placed, and stability was checked in full extension, mid-flexion, and deep flexion. Proper tibial rotation was determined and marked.  The patella tracked well without a lateral release.  The femoral lugs were then drilled. Trial components were then removed and tibial preparation performed.  The tibia was sized for a size E component.  A posterior capsular injection comprising of 20 cc of 1.3% exparel, 20 cc of 0.25% bupivicaine with  epi and 20 cc of normal saline was performed for postoperative pain control. The bony surfaces were irrigated with a pulse lavage and  then dried. Bone cement was vacuum mixed on the back table, and the final components sized above were cemented into place.  Antibiotic irrigation was placed in the knee joint and soft tissues while the cement cured.  After cement had finished curing, excess cement was removed. The stability of the construct was re-evaluated throughout a range of motion and found to be acceptable. The trial liner was removed, the knee was copiously irrigated, and the knee was re-evaluated for any excess bone debris. The real polyethylene liner, 13 mm thick, was inserted and checked to ensure the locking mechanism had engaged appropriately. The tourniquet was deflated and hemostasis was achieved. The wound was irrigated with normal saline.  One gram of vancomycin powder was placed in the surgical bed.  Capsular closure was performed with a #1 vicryl, subcutaneous fat closed with a 0 vicryl suture, then subcutaneous tissue closed with interrupted 2.0 vicryl suture. The skin was then closed with a 2.0 nylon and dermabond. A sterile dressing was applied.  The patient was awakened in the operating room and taken to recovery in stable condition. All sponge, needle, and instrument counts were correct at the end of the case.  Position: supine  Complications: none.  Time Out: performed   Drains/Packing: none  Estimated blood loss: minimal  Returned to Recovery Room: in good condition.   Antibiotics: yes   Mechanical VTE (DVT) Prophylaxis: sequential compression devices, TED thigh-high  Chemical VTE (DVT) Prophylaxis: aspirin  Fluid Replacement  Crystalloid: see anesthesia record Blood: none  FFP: none   Specimens Removed: 1 to pathology   Sponge and Instrument Count Correct? yes   PACU: portable radiograph - knee AP and Lateral   Plan/RTC: Return in 2 weeks for wound check.   Weight Bearing/Load Lower Extremity: full   N. Eduard Roux, MD [facility] 2:27 PM

## 2019-12-13 DIAGNOSIS — Z96651 Presence of right artificial knee joint: Secondary | ICD-10-CM | POA: Diagnosis not present

## 2019-12-13 DIAGNOSIS — M1711 Unilateral primary osteoarthritis, right knee: Secondary | ICD-10-CM | POA: Diagnosis not present

## 2019-12-13 LAB — CBC
HCT: 36.9 % (ref 36.0–46.0)
Hemoglobin: 11.7 g/dL — ABNORMAL LOW (ref 12.0–15.0)
MCH: 29.3 pg (ref 26.0–34.0)
MCHC: 31.7 g/dL (ref 30.0–36.0)
MCV: 92.5 fL (ref 80.0–100.0)
Platelets: 352 10*3/uL (ref 150–400)
RBC: 3.99 MIL/uL (ref 3.87–5.11)
RDW: 13.1 % (ref 11.5–15.5)
WBC: 7.9 10*3/uL (ref 4.0–10.5)
nRBC: 0 % (ref 0.0–0.2)

## 2019-12-13 LAB — BASIC METABOLIC PANEL
Anion gap: 8 (ref 5–15)
BUN: 14 mg/dL (ref 8–23)
CO2: 26 mmol/L (ref 22–32)
Calcium: 9.1 mg/dL (ref 8.9–10.3)
Chloride: 106 mmol/L (ref 98–111)
Creatinine, Ser: 0.92 mg/dL (ref 0.44–1.00)
GFR calc Af Amer: >60 mL/min (ref >60)
GFR calc non Af Amer: >60 mL/min (ref >60)
Glucose, Bld: 118 mg/dL — ABNORMAL HIGH (ref 70–99)
Potassium: 4.5 mmol/L (ref 3.5–5.1)
Sodium: 140 mmol/L (ref 135–145)

## 2019-12-13 NOTE — Discharge Summary (Signed)
Patient ID: Deborah [name] MRN: [id number] DOB/AGE: 11/17/1955 64 y.o.  Admit date: 12/12/2019 Discharge date: 12/13/2019  Admission Diagnoses:  Primary osteoarthritis of right knee  Discharge Diagnoses:  Principal Problem:   Primary osteoarthritis of right knee Active Problems:   Status post total right knee replacement   Past Medical History:  Diagnosis Date   Anemia    h/o of   Arthritis    in knees   Asthma    Breast cancer ([place])    Bronchitis    COPD (chronic obstructive pulmonary disease) ([place])    Diverticulosis 11/15/2012    noted on screening colonoscopy    Dyspnea    with COPD exerbation   Esophageal stricture    Former smoker 03/19/2011   GERD (gastroesophageal reflux disease)    Heart murmur    asymptomatic    History of laryngectomy    History of radiation therapy 11/15/18- 12/06/18   Right Breast total dose 42.56 Gy in 16 fractions.    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   Leukocytopenia    Nausea alone 07/28/2013   Neck pain 01/21/2012   Normal MRI 07/14/11   negative for mestasis    Pneumonia 2012   Sciatica    Seizures ([place])    07/24/11 off Effexor w/o seizure   Sepsis ([place]) 08/04/12   Sinusitis, chronic 07/20/2011   Bilateral maxillary, identified on MRI of head 07/14/11.     Tracheostomy dependent [facility])     Surgeries: Procedure(s): RIGHT TOTAL KNEE ARTHROPLASTY on 12/12/2019   Consultants (if any):   Discharged Condition: Improved  Hospital Course: Deborah [name] is an 64 y.o. female who was admitted 12/12/2019 with a diagnosis of Primary osteoarthritis of right knee and went to the operating room on 12/12/2019 and underwent the above named procedures.    She  was given perioperative antibiotics:  Anti-infectives (From admission, onward)   Start     Dose/Rate Route Frequency Ordered Stop   12/12/19 1900  ceFAZolin (ANCEF) IVPB 2g/100 mL premix        2 g 200 mL/hr over 30 Minutes Intravenous Every 6 hours 12/12/19 1712 12/13/19 0639   12/12/19 1330  vancomycin (VANCOCIN) powder  Status:  Discontinued          As needed 12/12/19 1330 12/12/19 1455   12/12/19 1100  ceFAZolin (ANCEF) IVPB 2g/100 mL premix        2 g 200 mL/hr over 30 Minutes Intravenous On call to O.R. 12/12/19 1053 12/12/19 1255   12/12/19 1058  ceFAZolin (ANCEF) 2-4 GM/100ML-% IVPB       Note to Pharmacy: Gleason, Ginger   : cabinet override      12/12/19 1058 12/12/19 1301    .  She was given sequential compression devices, early ambulation, and appropriate chemoprophylaxis for DVT prophylaxis.  She benefited maximally from the hospital stay and there were no complications.    Recent vital signs:  Vitals:   12/13/19 0333 12/13/19 0412  BP:  130/75  Pulse: 72 69  Resp: 18 16  Temp:  97.8 F (36.6 C)  SpO2: 96% 100%    Recent laboratory studies:  Lab Results  Component Value Date   HGB 11.7 (L)  12/13/2019   HGB 12.4 12/08/2019   HGB 12.4 03/19/2019   Lab Results  Component Value Date   WBC 7.9 12/13/2019   PLT 352 12/13/2019   Lab Results  Component Value Date   INR 1.0 12/08/2019   Lab Results  Component Value Date   NA 140 12/13/2019   K 4.5 12/13/2019   CL 106 12/13/2019   CO2 26 12/13/2019   BUN 14 12/13/2019   CREATININE 0.92 12/13/2019   GLUCOSE 118 (H) 12/13/2019    Discharge Medications:   Allergies as of 12/13/2019      Reactions   Lisinopril Cough      Medication List    TAKE these medications   albuterol (2.5 MG/3ML) 0.083% nebulizer solution Commonly known as: PROVENTIL Take 3 mLs (2.5 mg total) by nebulization every 6 (six) hours as needed for wheezing or shortness of breath. What changed: Another medication with the same  name was changed. Make sure you understand how and when to take each.   Proventil HFA 108 (90 Base) MCG/ACT inhaler Generic drug: albuterol INHALE 2 PUFFS BY MOUTH EVERY 4 HOURS AS NEEDED OR SHORTNESS OF BREATH What changed:   how much to take  how to take this  when to take this  reasons to take this  additional instructions   amLODipine 10 MG tablet Commonly known as: NORVASC Take 1 tablet (10 mg total) by mouth daily.   anastrozole 1 MG tablet Commonly known as: ARIMIDEX Take 1 tablet by mouth daily   aspirin EC 81 MG tablet Take 1 tablet (81 mg total) by mouth 2 (two) times daily.   benzonatate 100 MG capsule Commonly known as: TESSALON TAKE 1 CAPSULE BY MOUTH TWICE DAILY AS NEEDED FOR COUGH What changed: See the new instructions.   buPROPion 150 MG 24 hr tablet Commonly known as: WELLBUTRIN XL Take 1 tablet (150 mg total) by mouth daily.   cetirizine 10 MG tablet Commonly known as: ZYRTEC Take 1 tablet by mouth once daily   Dulera 200-5 MCG/ACT Aero Generic drug: mometasone-formoterol Inhale 2 puffs into the lungs 2 (two) times daily. What changed: when to take this   Euthyrox 175 MCG tablet Generic drug: levothyroxine TAKE 1 TABLET BY MOUTH ONCE DAILY BEFORE BREAKFAST What changed: See the new instructions.   losartan 100 MG tablet Commonly known as: COZAAR Take 1 tablet (100 mg total) by mouth daily.   meloxicam 15 MG tablet Commonly known as: MOBIC TAKE 1 TABLET BY MOUTH  DAILY AS NEEDED What changed: reasons to take this   methocarbamol 750 MG tablet Commonly known as: ROBAXIN Take 1 tablet (750 mg total) by mouth 2 (two) times daily as needed for muscle spasms.   metoprolol tartrate 50 MG tablet Commonly known as: LOPRESSOR Take 1 tablet (50 mg total) by mouth 2 (two) times daily.   nystatin cream Commonly known as: MYCOSTATIN APPLY  CREAM TOPICALLY TWICE DAILY AS NEEDED What changed: See the new instructions.   omeprazole 40 MG  capsule Commonly known as: PRILOSEC Take 1 capsule (40 mg total) by mouth 2 (two) times daily.   ondansetron 4 MG tablet Commonly known as: ZOFRAN Take 1-2 tablets (4-8 mg total) by mouth every 8 (eight) hours as needed for nausea or vomiting.   oxyCODONE-acetaminophen 5-325 MG tablet Commonly known as: Percocet Take 1-2 tablets by mouth every 8 (eight) hours as needed for severe pain.   senna-docusate 8.6-50 MG tablet Commonly known as: Senokot S Take 1-2 tablets by  mouth at bedtime as needed.   tiotropium 18 MCG inhalation capsule Commonly known as: SPIRIVA Place 18 mcg into inhaler and inhale daily as needed (shortness of breath).   Transderm-Scop (1.5 MG) 1 MG/3DAYS Generic drug: scopolamine PLACE 1 PATCH ONTO THE SKIN EVERY 3 DAYS AS NEEDED What changed: See the new instructions.            Durable Medical Equipment  (From admission, onward)         Start     Ordered   12/12/19 1713  DME Walker rolling  Once       Question:  Patient needs a walker to treat with the following condition  Answer:  Total knee replacement status   12/12/19 1712   12/12/19 1713  DME 3 n 1  Once        12/12/19 1712   12/12/19 1713  DME Bedside commode  Once       Question:  Patient needs a bedside commode to treat with the following condition  Answer:  Total knee replacement status   12/12/19 1712          Diagnostic Studies: DG Knee Right Port  Result Date: 12/12/2019 CLINICAL DATA:  Total knee replacement EXAM: PORTABLE RIGHT KNEE - 1-2 VIEW COMPARISON:  11/24/2019 FINDINGS: Total knee replacement in satisfactory position and alignment. No fracture or complication. There is gas and fluid in the joint. IMPRESSION: Satisfactory right knee replacement. Electronically Signed   By: Franchot Gallo M.D.   On: 12/12/2019 16:23   DG Knee AP/LAT W/[place] Right  Result Date: 11/25/2019 CLINICAL DATA:  Chronic right knee pain.  No known injury. EXAM: RIGHT KNEE 3 VIEWS COMPARISON:   04/21/2018 FINDINGS: Marked medial joint space narrowing. Mild medial and lateral spur formation. Marked patellofemoral spur formation with joint space narrowing. No effusion. IMPRESSION: Tricompartmental degenerative changes, most pronounced involving the medial compartment with mild progression. Electronically Signed   By: Claudie Revering M.D.   On: 11/25/2019 16:07    Disposition: Discharge disposition: 01-Home or Self Care       Discharge Instructions    Call MD / Call 911   Complete by: As directed    If you experience chest pain or shortness of breath, CALL 911 and be transported to the hospital emergency room.  If you develope a fever above 101.5 F, pus (white drainage) or increased drainage or redness at the wound, or calf pain, call your surgeon's office.   Constipation Prevention   Complete by: As directed    Drink plenty of fluids.  Prune juice may be helpful.  You may use a stool softener, such as Colace (over the counter) 100 mg twice a day.  Use MiraLax (over the counter) for constipation as needed.   Driving restrictions   Complete by: As directed    No driving while taking narcotic pain meds.   Increase activity slowly as tolerated   Complete by: As directed        Follow-up Information    Leandrew Koyanagi, MD In 2 weeks.   Specialty: Orthopedic Surgery Why: For suture removal, For wound re-check Contact information: [place] [address] [phone]                Signed: Eduard Roux 12/13/2019, 7:43 AM

## 2019-12-13 NOTE — TOC Initial Note (Signed)
Transition of Care [facility]) - Initial/Assessment Note    Patient Details  Name: Deborah [name] MRN: [id number] Date of Birth: [birth year]  [facility]) CM/SW Contact:    Curlene Labrum, RN Phone Number: 12/13/2019, 9:41 AM  Clinical Narrative:                  Case Management met with the patient and husband at the bedside - S/P Right total knee arthroplasty by Dr. Erlinda Hong on 12/12/2019.  Patient noted that she currently has RW, 3:1 and CPM for dme.  The husband will be transporting the patient home and aiding with ADL's at home.  Dr. Phoebe Sharps office set up the patient for [facility] for PT prior to surgery.  Patient acknowledged PT services.  Will continue to follow for discharge needs.  Expected Discharge Plan: [place] Barriers to Discharge: No Barriers Identified   Patient Goals and CMS Choice Patient states their goals for this hospitalization and ongoing recovery are:: Patient states that she is ready to get better and go home today. CMS Medicare.gov Compare Post Acute Care list provided to:: Patient Choice offered to / list presented to : Patient  Expected Discharge Plan and Services Expected Discharge Plan: [place]   Discharge Planning Services: CM Consult Post Acute Care Choice: Durable Medical Equipment, Home Health Living arrangements for the past 2 months: Single Family Home Expected Discharge Date: 12/13/19                         HH Arranged: PT HH Agency: [facility] [employer] Date [place]: 12/13/19 Time Force: [phone] Representative spoke with at [place]: Joen Laura, RNCM [facility] Arrangements/Services Living arrangements for the past 2 months: [place] Lives with:: Spouse Patient language and need for interpreter reviewed:: Yes Do you feel safe going back to the place where you live?: Yes      Need for Family  Participation in Patient Care: Yes (Comment) Care giver support system in place?: Yes (comment) Current home services: DME (currently has a RW, CPM, and 3:1 at home) Criminal Activity/Legal Involvement Pertinent to Current Situation/Hospitalization: No - Comment as needed  Activities of Daily Living      Permission Sought/Granted Permission sought to share information with : Case Manager       Permission granted to share info w AGENCY: [facility] granted to share info w Relationship: husband     Emotional Assessment Appearance:: Appears stated age Attitude/Demeanor/Rapport: Engaged Affect (typically observed): Accepting Orientation: : Oriented to Self, Oriented to Place, Oriented to  Time, Oriented to Situation Alcohol / Substance Use: Not Applicable Psych Involvement: No (comment)  Admission diagnosis:  Status post total right knee replacement [Z96.651] Patient Active Problem List   Diagnosis Date Noted   Status post total right knee replacement 12/12/2019   Primary osteoarthritis of right knee 12/11/2019   Epidermal cyst of neck 07/21/2019   Localized swelling, mass and lump, neck 07/20/2019   Allergic rhinitis 05/20/2019   GERD (gastroesophageal reflux disease)    COPD (chronic obstructive pulmonary disease) (H[facility])    Chronic diastolic (congestive) heart failure ([place])    Foreign body aspiration    Breast cancer ([place]) 07/06/2018   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[place]) 04/08/2018  Carpal tunnel syndrome 03/16/2018   Obesity 08/24/2017   Plantar fasciitis 06/06/2016   Low back pain 05/21/2016   Health care maintenance 12/29/2015   Osteoarthritis of knees, bilateral 05/31/2015   Knee pain 01/04/2015   Dysphagia 08/16/2014   Weight gain 08/05/2014   DOE (dyspnea on exertion) 04/28/2014   Frequent falls 12/23/2013   Bilateral shoulder pain 04/07/2013    Anemia in chronic illness 08/11/2012   Status post trachelectomy 08/04/2012   Chronic pain 01/20/2012   History of head and neck cancer 09/26/2011   Hx of radiation therapy    Tracheitis 07/17/2011   Seizure ([place]) 07/17/2011   Hypothyroidism 03/19/2011   Essential hypertension 03/19/2011   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

## 2019-12-13 NOTE — Anesthesia Postprocedure Evaluation (Signed)
Anesthesia Post Note  Patient: Deborah [name]  Procedure(s) Performed: RIGHT TOTAL KNEE ARTHROPLASTY (Right Knee)     Patient location during evaluation: Other Anesthesia Type: Spinal, Regional and MAC Level of consciousness: oriented and awake and alert Pain management: pain level controlled Vital Signs Assessment: post-procedure vital signs reviewed and stable Respiratory status: spontaneous breathing and respiratory function stable Cardiovascular status: blood pressure returned to baseline and stable Postop Assessment: no headache, no backache, no apparent nausea or vomiting, spinal receding and patient able to bend at knees Anesthetic complications: no   No complications documented.  Last Vitals:  Vitals:   12/13/19 0333 12/13/19 0412  BP:  130/75  Pulse: 72 69  Resp: 18 16  Temp:  36.6 C  SpO2: 96% 100%    Last Pain:  Vitals:   12/13/19 0412  TempSrc: Oral  PainSc:                  [name],W. EDMOND

## 2019-12-13 NOTE — Progress Notes (Signed)
° °  Subjective:  Patient reports pain as mild.  Did not get PT yesterday. Did well with CPM.  Objective:   VITALS:   Vitals:   12/12/19 2321 12/13/19 0022 12/13/19 0333 12/13/19 0412  BP:  104/60  130/75  Pulse: 78 65 72 69  Resp: 18 16 18 16   Temp:  98.1 F (36.7 C)  97.8 F (36.6 C)  TempSrc:  Oral  Oral  SpO2: 96% 92% 96% 100%  Weight:      Height:        Neurovascular intact Sensation intact distally Intact pulses distally Dorsiflexion/Plantar flexion intact Incision: dressing C/D/I and no drainage   Lab Results  Component Value Date   WBC 7.9 12/13/2019   HGB 11.7 (L) 12/13/2019   HCT 36.9 12/13/2019   MCV 92.5 12/13/2019   PLT 352 12/13/2019     Assessment/Plan:  1 Day Post-Op   - Expected postop acute blood loss anemia - will monitor for symptoms - Up with PT/OT - DVT ppx - SCDs, ambulation, aspirin - WBAT operative extremity - Pain control - Discharge planning - home with HHPT today  Eduard Roux 12/13/2019, 7:42 AM [phone]

## 2019-12-13 NOTE — Evaluation (Signed)
Physical Therapy Evaluation & Discharge Patient Details Name: Deborah [name] MRN: [id number] DOB: May 29, 1956 Today's Date: 12/13/2019   History of Present Illness  64 y.o. female s/p R TKA 12/13/19. PMH trachestomy, sepsis, siezures, neck pain, CA, COPD.  Clinical Impression  Pt presents with no major decrease in functional mobility secondary to above. PTA, pt works and lives with husband in one story home. Educ on WBAT precautions, positioning of knee at rest, LE therex, and importance of mobility. Today, pt able to complete all transfers mod(I) to independent with and without RW. Pt able to complete stair mobility mod(I). Pt independent from mobility stand point and no longer requires physical therapy acutely.     Follow Up Recommendations No PT follow up    Equipment Recommendations  None recommended by PT    Recommendations for Other Services       Precautions / Restrictions Precautions Precautions: None Restrictions Weight Bearing Restrictions: Yes RLE Weight Bearing: Weight bearing as tolerated      Mobility  Bed Mobility Overal bed mobility: Independent                Transfers Overall transfer level: Independent Equipment used: Rolling walker (2 wheeled);None             General transfer comment: Pt able to sit to stand with and without RW, mod (I) to independently  Ambulation/Gait Ambulation/Gait assistance: Modified independent (Device/Increase time) Gait Distance (Feet): 200 Feet Assistive device: Rolling walker (2 wheeled);None Gait Pattern/deviations: Step-through pattern;Decreased stride length;Wide base of support Gait velocity: Normal   General Gait Details: Pt able to amb 170 feet with RW, modified independent, no standing rest breaks. Pt able to amb around room, to bathroom and to recliner no assistive deviced independently.  Stairs Stairs: Yes Stairs assistance: Modified independent (Device/Increase time) Stair Management: One rail  Right Number of Stairs: 2 General stair comments: Educated on proper way to negotiate steps, pt able to negotiate steps without LOB, modifed independent  Wheelchair Mobility    Modified Rankin (Stroke Patients Only)       Balance Overall balance assessment: No apparent balance deficits (not formally assessed)   Sitting balance-Leahy Scale: Normal       Standing balance-Leahy Scale: Good Standing balance comment: Pt able to amb with and without assistive device, no LOB, pt able to sit to stand on standard height toilet independently.                             Pertinent Vitals/Pain Pain Assessment: No/denies pain    Home Living Family/patient expects to be discharged to:: Private residence Living Arrangements: Spouse/significant other;Children Available Help at Discharge: Family Type of Home: House Home Access: Stairs to enter Entrance Stairs-Rails: Right Entrance Stairs-Number of Steps: 2 Home Layout: One level Home Equipment: Environmental consultant - 2 wheels      Prior Function Level of Independence: Independent               Hand Dominance        Extremity/Trunk Assessment   Upper Extremity Assessment Upper Extremity Assessment: Overall WFL for tasks assessed    Lower Extremity Assessment Lower Extremity Assessment: Overall WFL for tasks assessed    Cervical / Trunk Assessment Cervical / Trunk Assessment: Kyphotic  Communication   Communication: No difficulties  Cognition Arousal/Alertness: Awake/alert Behavior During Therapy: WFL for tasks assessed/performed Overall Cognitive Status: Within Functional Limits for tasks assessed  General Comments General comments (skin integrity, edema, etc.): Pt reports she prepared for her surgery by doing research and has been completing exercises while in bed.    Exercises Total Joint Exercises Ankle Circles/Pumps: AROM;5 reps;Both;Seated Quad Sets:  AROM;5 reps;Both;Seated Hip ABduction/ADduction: AROM;5 reps;Both;Seated Long Arc Quad: AROM;Both;5 reps;Seated Knee Flexion: AROM;Both;Seated;5 reps   Assessment/Plan    PT Assessment Patent does not need any further PT services  PT Problem List         PT Treatment Interventions      PT Goals (Current goals can be found in the Care Plan section)  Acute Rehab PT Goals PT Goal Formulation: All assessment and education complete, DC therapy    Frequency     Barriers to discharge        Co-evaluation               AM-PAC PT "6 Clicks" Mobility  Outcome Measure Help needed turning from your back to your side while in a flat bed without using bedrails?: None Help needed moving from lying on your back to sitting on the side of a flat bed without using bedrails?: None Help needed moving to and from a bed to a chair (including a wheelchair)?: None Help needed standing up from a chair using your arms (e.g., wheelchair or bedside chair)?: None Help needed to walk in hospital room?: None Help needed climbing 3-5 steps with a railing? : None 6 Click Score: 24    End of Session Equipment Utilized During Treatment: Gait belt Activity Tolerance: Patient tolerated treatment well Patient left: in chair;with call bell/phone within reach Nurse Communication: Mobility status      Time: 0981-1914 PT Time Calculation (min) (ACUTE ONLY): 22 min   Charges:   PT Evaluation $PT Eval Low Complexity: 1 Low          Tauren Delbuono SPT 12/13/2019   Rolland Porter 12/13/2019, 10:39 AM

## 2019-12-13 NOTE — Progress Notes (Signed)
Foley cath removed @0600  via sterile technique per protocol, tolerated px well. Pain being managed with currrent regime.  Drsg to r knee is CDI. Not in apparent distress. Will cont. To monitor.

## 2019-12-13 NOTE — Plan of Care (Signed)

## 2019-12-13 NOTE — Progress Notes (Signed)
Discharge package printed and intructions given to patient. Verbalizing understood.

## 2019-12-14 ENCOUNTER — Telehealth: Payer: Self-pay | Admitting: Orthopaedic Surgery

## 2019-12-14 ENCOUNTER — Encounter (HOSPITAL_COMMUNITY): Payer: Self-pay | Admitting: Orthopaedic Surgery

## 2019-12-14 DIAGNOSIS — Z471 Aftercare following joint replacement surgery: Secondary | ICD-10-CM | POA: Diagnosis not present

## 2019-12-14 NOTE — Telephone Encounter (Signed)
Discussed medications with patient . She will pick up remainder.

## 2019-12-14 NOTE — Telephone Encounter (Signed)
Pt called wanting to make sure she wasn't suppose to receive more than the oxycodone and muscle relaxers, because she thought Dr.Xu had said he'd be sending in 5 different things?  [phone]

## 2019-12-15 DIAGNOSIS — Z96651 Presence of right artificial knee joint: Secondary | ICD-10-CM | POA: Diagnosis not present

## 2019-12-15 DIAGNOSIS — M1711 Unilateral primary osteoarthritis, right knee: Secondary | ICD-10-CM | POA: Diagnosis not present

## 2019-12-18 ENCOUNTER — Other Ambulatory Visit: Payer: Self-pay | Admitting: Family Medicine

## 2019-12-22 NOTE — Telephone Encounter (Signed)
Can we call her to schedule an appt with me in august? I want to follow up with her after her knee surgery.

## 2019-12-26 DIAGNOSIS — Z8589 Personal history of malignant neoplasm of other organs and systems: Secondary | ICD-10-CM | POA: Diagnosis not present

## 2019-12-26 DIAGNOSIS — D638 Anemia in other chronic diseases classified elsewhere: Secondary | ICD-10-CM | POA: Diagnosis not present

## 2019-12-26 DIAGNOSIS — Z87891 Personal history of nicotine dependence: Secondary | ICD-10-CM | POA: Diagnosis not present

## 2019-12-26 DIAGNOSIS — F325 Major depressive disorder, single episode, in full remission: Secondary | ICD-10-CM | POA: Diagnosis not present

## 2019-12-26 DIAGNOSIS — K219 Gastro-esophageal reflux disease without esophagitis: Secondary | ICD-10-CM | POA: Diagnosis not present

## 2019-12-26 DIAGNOSIS — Z96651 Presence of right artificial knee joint: Secondary | ICD-10-CM | POA: Diagnosis not present

## 2019-12-26 DIAGNOSIS — Z853 Personal history of malignant neoplasm of breast: Secondary | ICD-10-CM | POA: Diagnosis not present

## 2019-12-26 DIAGNOSIS — E669 Obesity, unspecified: Secondary | ICD-10-CM | POA: Diagnosis not present

## 2019-12-26 DIAGNOSIS — E039 Hypothyroidism, unspecified: Secondary | ICD-10-CM | POA: Diagnosis not present

## 2019-12-26 DIAGNOSIS — Z7951 Long term (current) use of inhaled steroids: Secondary | ICD-10-CM | POA: Diagnosis not present

## 2019-12-26 DIAGNOSIS — Z471 Aftercare following joint replacement surgery: Secondary | ICD-10-CM | POA: Diagnosis not present

## 2019-12-26 DIAGNOSIS — J309 Allergic rhinitis, unspecified: Secondary | ICD-10-CM | POA: Diagnosis not present

## 2019-12-26 DIAGNOSIS — I110 Hypertensive heart disease with heart failure: Secondary | ICD-10-CM | POA: Diagnosis not present

## 2019-12-26 DIAGNOSIS — I5032 Chronic diastolic (congestive) heart failure: Secondary | ICD-10-CM | POA: Diagnosis not present

## 2019-12-26 DIAGNOSIS — J449 Chronic obstructive pulmonary disease, unspecified: Secondary | ICD-10-CM | POA: Diagnosis not present

## 2019-12-26 DIAGNOSIS — Z6833 Body mass index (BMI) 33.0-33.9, adult: Secondary | ICD-10-CM | POA: Diagnosis not present

## 2019-12-26 DIAGNOSIS — Z8521 Personal history of malignant neoplasm of larynx: Secondary | ICD-10-CM | POA: Diagnosis not present

## 2019-12-26 DIAGNOSIS — M1712 Unilateral primary osteoarthritis, left knee: Secondary | ICD-10-CM | POA: Diagnosis not present

## 2019-12-27 ENCOUNTER — Encounter: Payer: Self-pay | Admitting: Orthopaedic Surgery

## 2019-12-27 ENCOUNTER — Ambulatory Visit (INDEPENDENT_AMBULATORY_CARE_PROVIDER_SITE_OTHER): Payer: Medicaid Other | Admitting: Physician Assistant

## 2019-12-27 VITALS — Ht 67.0 in | Wt 214.0 lb

## 2019-12-27 DIAGNOSIS — Z96651 Presence of right artificial knee joint: Secondary | ICD-10-CM

## 2019-12-27 MED ORDER — OXYCODONE-ACETAMINOPHEN 5-325 MG PO TABS: 1.0000 | ORAL_TABLET | Freq: Three times a day (TID) | ORAL | 0 refills | Status: DC | PRN

## 2019-12-27 NOTE — Progress Notes (Signed)
Post-Op Visit Note   Patient: Deborah [name]           Date of Birth: Aug 15, 1955           MRN: [id number] Visit Date: 12/27/2019 PCP: Benay Pike, MD   Assessment & Plan:  Chief Complaint:  Chief Complaint  Patient presents with   Right Knee - Routine Post Op    Right TKA DOS 12-12-2019   Visit Diagnoses:  1. Hx of total knee arthroplasty, right     Plan: Patient is a pleasant 64 year old female who comes in today 2 weeks status post right total knee replacement 12/12/2019.  She has been doing fairly well.  She has only had 2 sessions of home health physical therapy due to insurance approval.  She is however, doing well.  Examination of her right knee reveals mild to moderate swelling.  Calf is soft nontender.  She does have a well healing surgical incision without complication.  At this point, we will remove the sutures and apply Steri-Strips.  We will transition her to formal physical therapy.  Internal referral has been made.  I have refilled her Percocet.  We did discuss dental prophylaxis, but she notes that she has dentures and only has denture readjustment.  He does not need to be medicated for this.  Follow-up with [place] in 4 weeks time for repeat evaluation and 2 view x-rays of the right knee.  Call with concerns or questions.  Follow-Up Instructions: Return in about 4 weeks (around 01/24/2020).   Orders:  Orders Placed This Encounter  Procedures   Ambulatory referral to Physical Therapy   Meds ordered this encounter  Medications   oxyCODONE-acetaminophen (PERCOCET) 5-325 MG tablet    Sig: Take 1-2 tablets by mouth every 8 (eight) hours as needed for severe pain.    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[place]) 04/08/2018   Carpal tunnel syndrome 03/16/2018   Obesity 08/24/2017   Plantar fasciitis 06/06/2016   Low back pain 05/21/2016   Health care maintenance 12/29/2015   Osteoarthritis of knees, bilateral 05/31/2015   Knee pain 01/04/2015   Dysphagia 08/16/2014   Weight gain 08/05/2014   DOE (dyspnea on exertion) 04/28/2014   Frequent falls 12/23/2013   Bilateral shoulder pain 04/07/2013   Anemia in chronic illness 08/11/2012   Status post trachelectomy 08/04/2012   Chronic pain 01/20/2012   History of head and neck cancer 09/26/2011   Hx of radiation therapy    Tracheitis 07/17/2011   Seizure ([place]) 07/17/2011   Hypothyroidism 03/19/2011   Essential hypertension 03/19/2011   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   Leukocytopenia    Nausea alone 07/28/2013   Neck pain 01/21/2012   Normal MRI 07/14/11   negative for mestasis    Pneumonia 2012   Sciatica    Seizures ([place])    07/24/11 off Effexor w/o seizure   Sepsis ([place]) 08/04/12   Sinusitis, chronic 07/20/2011   Bilateral maxillary, identified on MRI of head 07/14/11.     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[place])     Family History  Problem Relation Age of Onset   Heart disease Mother    Heart disease Father    Heart disease Sister    Cancer Brother        type unknown   Liver disease Maternal Grandmother    Cancer Sister        type unknown   Diabetes Sister    Breast cancer Neg Hx     Past Surgical History:  Procedure Laterality Date   BREAST LUMPECTOMY Right 07/06/2018   BREAST LUMPECTOMY WITH RADIOACTIVE SEED LOCALIZATION Right 07/06/2018   Procedure: RIGHT BREAST LUMPECTOMY WITH RADIOACTIVE SEED LOCALIZATION;  Surgeon: Rolm Bookbinder, MD;  Location: [place] [facility];  Service: General;  Laterality: Right;   COLONOSCOPY N/A 11/15/2012   Procedure: COLONOSCOPY;  Surgeon: Lafayette Dragon, MD;  Location: [facility];  Service: Endoscopy;  Laterality: N/A;   DENTAL RESTORATION/EXTRACTION WITH X-RAY     ESOPHAGEAL DILATION N/A 09/09/2019   Procedure: ESOPHAGEAL DILATION;  Surgeon: [name], MD;  Location: [facility];  Service: ENT;  Laterality: N/A;   ESOPHAGEAL DILATION N/A 10/05/2019   Procedure: ESOPHAGEAL DILATION;  Surgeon: [name], MD;  Location: [place];  Service: ENT;  Laterality: N/A;  via tracheostomy   ESOPHAGOGASTRODUODENOSCOPY (EGD) WITH PROPOFOL N/A 08/12/2018   Procedure: ESOPHAGOGASTRODUODENOSCOPY (EGD) WITH PROPOFOL;  Surgeon: Doran Stabler, MD;  Location: [facility];  Service: Gastroenterology;  Laterality: N/A;   ESOPHAGOSCOPY  06/21/2012   Procedure: ESOPHAGOSCOPY;  Surgeon: [name], MD;   Location: [facility];  Service: ENT;  Laterality: N/A;   ESOPHAGOSCOPY WITH DILITATION N/A 09/21/2014   Procedure: ESOPHAGOSCOPY WITH DILITATION;  Surgeon: [name], MD;  Location: [place];  Service: ENT;  Laterality: N/A;   ESOPHAGOSCOPY WITH DILITATION N/A 07/04/2016   Procedure: ESOPHAGOSCOPY WITH DILITATION;  Surgeon: [name], MD;  [facility];  Service: ENT;  Laterality: N/A;   ESOPHAGOSCOPY WITH DILITATION N/A 12/01/2017   Procedure: ESOPHAGOSCOPY WITH DILITATION;  Surgeon: [name], MD;  Location: [place];  Service: ENT;  Laterality: N/A;   ESOPHAGOSCOPY WITH DILITATION N/A 04/19/2018   Procedure: ESOPHAGOSCOPY WITH DILITATION;  Surgeon: [name], MD;  Location: [place];  Service: ENT;  Laterality: N/A;  ESOPHAGOSCOPY WITH DILITATION N/A 03/07/2019   Procedure: Esophagoscopy with dilatation;  Surgeon: [name], MD;  Location: [place];  Service: ENT;  Laterality: N/A;   FLEXIBLE BRONCHOSCOPY  01/08/2018       FOREIGN BODY REMOVAL BRONCHIAL  10/02/2011   Procedure: REMOVAL FOREIGN BODY BRONCHIAL;  Surgeon: Ruby Cola, MD;  Location: [facility];  Service: ENT;  Laterality: N/A;   FOREIGN BODY REMOVAL BRONCHIAL N/A 01/08/2018   Procedure: REMOVAL FOREIGN BODY BRONCHIAL;  Surgeon: Jodi Marble, MD;  Location: [facility];  Service: ENT;  Laterality: N/A;   LARYNGECTOMY     Porta cath removal     PORTACATH PLACEMENT  09/17/10   Tip in cavoatrial junction   RE-EXCISION OF BREAST CANCER,SUPERIOR MARGINS Right 07/29/2018   Procedure: RE-EXCISION OF RIGHT BREAST MEDIAL MARGINS;  Surgeon: Rolm Bookbinder, MD;  Location: [place];  Service: General;  Laterality: Right;   RIGID ESOPHAGOSCOPY N/A 03/19/2019   Procedure: FLEXIBLE ESOPHAGOSCOPY;  Surgeon: Jodi Marble, MD;  Location: [place];  Service: ENT;  Laterality: N/A;   STOMAPLASTY N/A 10/21/2016   Procedure: Zola Button;  Surgeon: [name], MD;  Location: [place];  Service: ENT;  Laterality:  N/A;   TOTAL KNEE ARTHROPLASTY Right 12/12/2019   Procedure: RIGHT TOTAL KNEE ARTHROPLASTY;  Surgeon: Leandrew Koyanagi, MD;  Location: [place];  Service: Orthopedics;  Laterality: Right;   TRACHEAL DILITATION  07/16/2011   Procedure: TRACHEAL DILITATION;  Surgeon: Beckie Salts, MD;  Location: [place];  Service: ENT;  Laterality: N/A;  dilation of tracheal stoma and replacement of stoma tube   TUBAL LIGATION  1982   Social History   Occupational History   Occupation: part time    Comment: warehouse work  Tobacco Use   Smoking status: Former Smoker    Packs/day: 2.00    Years: 20.00    Pack years: 40.00    Types: Cigarettes    Quit date: 09/17/2010    Years since quitting: 9.2   Smokeless tobacco: Never Used  Vaping Use   Vaping Use: Never used  Substance and Sexual Activity   Alcohol use: Yes    Comment: Socially drinks    Drug use: No    Comment: tried cocaine 1 time 2010 only used 1 time   Sexual activity: Yes    Birth control/protection: Surgical

## 2019-12-28 ENCOUNTER — Ambulatory Visit: Payer: Medicaid Other | Attending: Physician Assistant | Admitting: Physical Therapy

## 2019-12-28 ENCOUNTER — Encounter: Payer: Self-pay | Admitting: Physical Therapy

## 2019-12-28 ENCOUNTER — Other Ambulatory Visit: Payer: Self-pay

## 2019-12-28 DIAGNOSIS — M6281 Muscle weakness (generalized): Secondary | ICD-10-CM | POA: Diagnosis not present

## 2019-12-28 DIAGNOSIS — G8929 Other chronic pain: Secondary | ICD-10-CM | POA: Diagnosis not present

## 2019-12-28 DIAGNOSIS — R600 Localized edema: Secondary | ICD-10-CM | POA: Diagnosis not present

## 2019-12-28 DIAGNOSIS — M25561 Pain in right knee: Secondary | ICD-10-CM | POA: Insufficient documentation

## 2019-12-28 DIAGNOSIS — R2689 Other abnormalities of gait and mobility: Secondary | ICD-10-CM | POA: Diagnosis not present

## 2019-12-28 DIAGNOSIS — M25661 Stiffness of right knee, not elsewhere classified: Secondary | ICD-10-CM | POA: Insufficient documentation

## 2019-12-28 NOTE — Therapy (Signed)
[place], [place], [address] Phone: [phone]   Fax:  [phone]   Physical Therapy Evaluation  Patient Details  Name: Citlally Captain MRN: [id number] Date of Birth: Nov 22, 1955 Referring Provider (PT): Aundra Dubin, [place]   Encounter Date: 12/28/2019   PT End of Session - 12/28/19 0826    Visit Number 1    Number of Visits 17    Date for PT Re-Evaluation 02/22/20    Authorization Type [employer]    PT Start Time 0830    PT Stop Time 0915    PT Time Calculation (min) 45 min    Activity Tolerance Patient tolerated treatment well    Behavior During Therapy [facility] for tasks assessed/performed           Past Medical History:  Diagnosis Date  . Anemia    h/o of  . Arthritis    in knees  . Asthma   . Breast cancer ([place])   . Bronchitis   . COPD (chronic obstructive pulmonary disease) ([place])   . Diverticulosis 11/15/2012    noted on screening colonoscopy   . Dyspnea    with COPD exerbation  . Esophageal stricture   . Former smoker 03/19/2011  . GERD (gastroesophageal reflux disease)   . Heart murmur    asymptomatic   . History of laryngectomy   . History of radiation therapy 11/15/18- 12/06/18   Right Breast total dose 42.56 Gy in 16 fractions.   [name] Hx of radiation therapy 09/03/10 to 10/16/2010   supraglottic larynx  . Hypertension   . 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 Internal hemorrhoid 11/15/2012    small, noted on screening colonoscopy   . 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  . Leukocytopenia   . Nausea alone 07/28/2013  . Neck pain 01/21/2012  . Normal MRI 07/14/11   negative for mestasis   . Pneumonia 2012  . Sciatica   . Seizures ([place])    07/24/11 off Effexor w/o seizure  . Sepsis ([place]) 08/04/12  . Sinusitis, chronic 07/20/2011   Bilateral maxillary, identified on MRI of head 07/14/11.    . Tracheostomy dependent [facility])      Past Surgical History:  Procedure Laterality Date  . BREAST LUMPECTOMY Right 07/06/2018  . BREAST LUMPECTOMY WITH RADIOACTIVE SEED LOCALIZATION Right 07/06/2018   Procedure: RIGHT BREAST LUMPECTOMY WITH RADIOACTIVE SEED LOCALIZATION;  Surgeon: Rolm Bookbinder, MD;  Location: [place];  Service: General;  Laterality: Right;  . COLONOSCOPY N/A 11/15/2012   Procedure: COLONOSCOPY;  Surgeon: Lafayette Dragon, MD;  Location: [facility];  Service: Endoscopy;  Laterality: N/A;  . DENTAL RESTORATION/EXTRACTION WITH X-RAY    . ESOPHAGEAL DILATION N/A 09/09/2019   Procedure: ESOPHAGEAL DILATION;  Surgeon: [name], MD;  Location: [place];  Service: ENT;  Laterality: N/A;  . ESOPHAGEAL DILATION N/A 10/05/2019   Procedure: ESOPHAGEAL DILATION;  Surgeon: [name], MD;  Location: [place];  Service: ENT;  Laterality: N/A;  via tracheostomy  . ESOPHAGOGASTRODUODENOSCOPY (EGD) WITH PROPOFOL N/A 08/12/2018   Procedure: ESOPHAGOGASTRODUODENOSCOPY (EGD) WITH PROPOFOL;  Surgeon: Doran Stabler, MD;  Location: [place];  Service: Gastroenterology;  Laterality: N/A;  . ESOPHAGOSCOPY  06/21/2012   Procedure: ESOPHAGOSCOPY;  Surgeon: [name], MD;  Location: [place];  Service: ENT;  Laterality: N/A;  . ESOPHAGOSCOPY WITH DILITATION N/A 09/21/2014   Procedure: ESOPHAGOSCOPY WITH  DILITATION;  Surgeon: [name], MD;  Location: [place];  Service: ENT;  Laterality: N/A;  . ESOPHAGOSCOPY WITH DILITATION N/A 07/04/2016   Procedure: ESOPHAGOSCOPY WITH DILITATION;  Surgeon: [name], MD;  Location: [place];  Service: ENT;  Laterality: N/A;  . ESOPHAGOSCOPY WITH DILITATION N/A 12/01/2017   Procedure: ESOPHAGOSCOPY WITH DILITATION;  Surgeon: [name], MD;  Location: [place];  Service: ENT;  Laterality: N/A;  . ESOPHAGOSCOPY WITH DILITATION N/A 04/19/2018   Procedure: ESOPHAGOSCOPY WITH DILITATION;  Surgeon: [name], MD;  Location: [place];  Service: ENT;   Laterality: N/A;  . ESOPHAGOSCOPY WITH DILITATION N/A 03/07/2019   Procedure: Esophagoscopy with dilatation;  Surgeon: [name], MD;  Location: [place];  Service: ENT;  Laterality: N/A;  . FLEXIBLE BRONCHOSCOPY  01/08/2018      . FOREIGN BODY REMOVAL BRONCHIAL  10/02/2011   Procedure: REMOVAL FOREIGN BODY BRONCHIAL;  Surgeon: Ruby Cola, MD;  Location: [place];  Service: ENT;  Laterality: N/A;  . FOREIGN BODY REMOVAL BRONCHIAL N/A 01/08/2018   Procedure: REMOVAL FOREIGN BODY BRONCHIAL;  Surgeon: Jodi Marble, MD;  Location[facility];  Service: ENT;  Laterality: N/A;  . LARYNGECTOMY    . Porta cath removal    . PORTACATH PLACEMENT  09/17/10   Tip in cavoatrial junction  . RE-EXCISION OF BREAST CANCER,SUPERIOR MARGINS Right 07/29/2018   Procedure: RE-EXCISION OF RIGHT BREAST MEDIAL MARGINS;  Surgeon: Rolm Bookbinder, MD;  Location: [place];  Service: General;  Laterality: Right;  . RIGID ESOPHAGOSCOPY N/A 03/19/2019   Procedure: FLEXIBLE ESOPHAGOSCOPY;  Surgeon: Jodi Marble, MD;  Location: [place];  Service: ENT;  Laterality: N/A;  . STOMAPLASTY N/A 10/21/2016   Procedure: Zola Button;  Surgeon: [name], MD;  Location: [place];  Service: ENT;  Laterality: N/A;  . TOTAL KNEE ARTHROPLASTY Right 12/12/2019   Procedure: RIGHT TOTAL KNEE ARTHROPLASTY;  Surgeon: Leandrew Koyanagi, MD;  Location: [place];  Service: Orthopedics;  Laterality: Right;  . TRACHEAL DILITATION  07/16/2011   Procedure: TRACHEAL DILITATION;  Surgeon: Beckie Salts, MD;  Location: [place];  Service: ENT;  Laterality: N/A;  dilation of tracheal stoma and replacement of stoma tube  . TUBAL LIGATION  1982    There were no vitals filed for this visit.    Subjective Assessment - 12/28/19 0824    Subjective Patient reports right total knee replacement on 12/12/2019. She has not been using an assistive device. She reports her pain has been pretty bad sometimes off and on. She had 2 visits of home health therapy where she  was doing some exercises.    Limitations Standing;Walking;House hold activities;Sitting;Lifting    How long can you sit comfortably? No limitation - knee will get stiff    How long can you stand comfortably? Patient reports she can stand as long as she needs to but is not comfortable    How long can you walk comfortably? Patient hasn't tried walking long distances - she is more limited from breathing    Patient Stated Goals Get rid of the pain and back to walking    Currently in Pain? Yes    Pain Score 10-Worst pain ever    Pain Location Knee    Pain Orientation Right    Pain Descriptors / Indicators Aching;Constant    Pain Type Surgical pain    Pain Onset 1 to 4 weeks ago    Pain Frequency Constant    Aggravating Factors  Bending the knee, standing, walking,  putting pressure on the inside of the knee when lying down    Pain Relieving Factors Medication, ice    Effect of Pain on Daily Activities Patient limited with walking or standing tasks              [facility] PT Assessment - 12/28/19 0001      Assessment   Medical Diagnosis Right TKA    Referring Provider (PT) Aundra Dubin, PA-C    Onset Date/Surgical Date 12/12/19    Hand Dominance Right    Next MD Visit 01/24/2020    Prior Therapy [facility]      Precautions   Precautions None      Restrictions   Weight Bearing Restrictions No      Balance Screen   Has the patient fallen in the past 6 months No    Has the patient had a decrease in activity level because of a fear of falling?  No    Is the patient reluctant to leave their home because of a fear of falling?  No      Home Ecologist residence    Living Arrangements Spouse/significant other    Home Access Stairs to enter    Entrance Stairs-Number of Steps 2    Entrance Stairs-Rails Right    [place] One level      Prior Function   Level of Independence Independent with basic ADLs    Leisure None reported      Cognition   Overall  Cognitive Status Within Functional Limits for tasks assessed      Observation/Other Assessments   Observations Patient appears in no apparent distress    Focus on Therapeutic Outcomes (FOTO)  NA - MCD      Observation/Other Assessments-Edema    Edema Circumferential      Circumferential Edema   Circumferential - Right 44    Circumferential - Left  42      Sensation   Light Touch Appears Intact      Functional Tests   Functional tests Sit to Stand      Sit to Stand   Comments Patient exhibits weight shift toward left, decreased right knee flexion, use of UE for assist      ROM / Strength   AROM / PROM / Strength AROM;PROM;Strength      AROM   Overall AROM Comments Increased right knee pain throughout ranges    AROM Assessment Site Knee    Right/Left Knee Right;Left    Right Knee Extension -8   lacking   Right Knee Flexion 95    Left Knee Extension 0    Left Knee Flexion 126      PROM   Overall PROM Comments Right knee PROM=AROM      Strength   Strength Assessment Site Hip;Knee    Right/Left Hip Right;Left    Right Hip Flexion 4-/5    Right Hip Extension 4-/5    Right Hip ABduction 3+/5    Left Hip Flexion 4/5    Left Hip Extension 4-/5    Left Hip ABduction 4-/5    Right/Left Knee Right;Left    Right Knee Flexion 4-/5    Right Knee Extension 4-/5   able to perform SLR without lag   Left Knee Flexion 5/5    Left Knee Extension 5/5      Flexibility   Soft Tissue Assessment /Muscle Length yes    Hamstrings Limited    Quadriceps Limited  Palpation   Patella mobility Hypomobile superior/inferior    Palpation comment Generalized tenderness of right knee, calf soft and non-tender      Special Tests   Other special tests None performed      Transfers   Transfers Independent with all Transfers      Ambulation/Gait   Ambulation/Gait Yes    Ambulation/Gait Assistance 7: Independent    Gait Comments Antalgic on right with decreased knee flexion                        Objective measurements completed on examination: See above findings.       [place] Adult PT Treatment/Exercise - 12/28/19 0001      Exercises   Exercises Knee/Hip      Knee/Hip Exercises: Stretches   Passive Hamstring Stretch 2 reps;20 seconds    Passive Hamstring Stretch Limitations seated edge of mat    Knee: Self-Stretch to increase Flexion 5 reps    Knee: Self-Stretch Limitations Supine heel slide with strap x5 5 sec hold    Gastroc Stretch 2 reps;20 seconds    Gastroc Stretch Limitations supine with strap      Knee/Hip Exercises: Seated   Heel Slides 10 reps    Heel Slides Limitations knee flexion/extension AROM      Knee/Hip Exercises: Supine   Quad Sets 5 sets   5 sec hold   Quad Sets Limitations towel roll under knee    Heel Prop for Knee Extension 3 minutes    Straight Leg Raises 5 sets    Straight Leg Raises Limitations cued for quad set                  PT Education - 12/28/19 0825    Education Details Exam findings, POC, HEP    Person(s) Educated Patient    Methods Explanation;Demonstration;Tactile cues;Verbal cues;Handout    Comprehension Verbalized understanding;Returned demonstration;Verbal cues required;Tactile cues required;Need further instruction            PT Short Term Goals - 12/28/19 0924      PT SHORT TERM GOAL #1   Title Patient will be I with initial HEP to progress with PT    Baseline HEP provided at evaluation and cues required    Time 3    Period Weeks    Status New    Target Date 01/18/20      PT SHORT TERM GOAL #2   Title Patient will exhibit improved AROM knee flexion to >/= 110 deg and knee extension to </= 5 deg lacking from neutral to improve gait and transfers    Baseline AROM knee flexion: 95, extension: 8 lacking from neutral    Time 3    Period Weeks    Status New    Target Date 01/18/20      PT SHORT TERM GOAL #3   Title Patient will report </= 6/10 pain level with activity to improve  functional ability    Baseline 10/10 pain level with activity    Time 3    Period Weeks    Status New    Target Date 01/18/20             PT Long Term Goals - 12/28/19 1334      PT LONG TERM GOAL #1   Title Patient will be I with final HEP to maintain progress from PT    Time 8    Period Weeks    Status New  Target Date 02/22/20      PT LONG TERM GOAL #2   Title Patient will demonstrated right knee AROM WFL to all for normalalized transfers and gait    Time 8    Period Weeks    Status New    Target Date 02/22/20      PT LONG TERM GOAL #3   Title Patient will exhibit right knee strength grossly 5/5 to improve stair negotiation and activity tolerance    Time 8    Period Weeks    Status New    Target Date 02/22/20      PT LONG TERM GOAL #4   Title Patient will report no limitation with walking or standing    Time 8    Period Weeks    Status New    Target Date 02/22/20                  Plan - 12/28/19 0826    Clinical Impression Statement Patient presents to PT following right TKA. She demonstrates limitations in right knee motion, strength, flexibility, gait and transfer deviations, increased edema. She would benefit from continued skilled PT to address limitations and progress motion and strength to improve walking ability and maximize functional level.    Personal Factors and Comorbidities Past/Current Experience;Comorbidity 3+;Fitness;Social Background    Comorbidities Tracheostomy dependent, HTN, hx of breast and larynx cancer    Examination-Activity Limitations Locomotion Level;Transfers;Bathing;Sit;Squat;Stairs;Stand;Lift;Dressing;Carry    Examination-Participation Restrictions Meal Prep;Cleaning;Community Activity;Driving;Shop;Laundry;Yard Work    Stability/Clinical Decision Making Stable/Uncomplicated    Clinical Decision Making Low    Rehab Potential Good    PT Frequency 2x / week    PT Duration 8 weeks    PT Treatment/Interventions ADLs/Self  Care Home Management;Cryotherapy;Electrical Stimulation;Moist Heat;Neuromuscular re-education;Balance training;Therapeutic exercise;Therapeutic activities;Functional mobility training;Stair training;Gait training;Patient/family education;Manual techniques;Dry needling;Passive range of motion;Taping;Vasopneumatic Device;Joint Manipulations    PT Next Visit Plan Assess HEP and progress PRN, nustep, manual and stretching for improving knee motion, progress strength as tolerated, gait and balance training, vaso    PT Home Exercise Plan [email or web address]: heel prop, quad set, SLR, supine calf stretch with strap, supine heel slide with strap, seated hamstring stretch, seated knee AROM    Consulted and Agree with Plan of Care Patient           Patient will benefit from skilled therapeutic intervention in order to improve the following deficits and impairments:  Abnormal gait, Decreased range of motion, Difficulty walking, Pain, Decreased activity tolerance, Decreased balance, Decreased strength, Improper body mechanics, Impaired flexibility  Visit Diagnosis: Chronic pain of right knee  Stiffness of right knee, not elsewhere classified  Muscle weakness (generalized)  Other abnormalities of gait and mobility  Localized edema     Problem List Patient Active Problem List   Diagnosis Date Noted  . Status post total right knee replacement 12/12/2019  . Primary osteoarthritis of right knee 12/11/2019  . Epidermal cyst of neck 07/21/2019  . Localized swelling, mass and lump, neck 07/20/2019  . Allergic rhinitis 05/20/2019  . GERD (gastroesophageal reflux disease)   . COPD (chronic obstructive pulmonary disease) ([place])   . Chronic diastolic (congestive) heart failure ([place])   . Foreign body aspiration   . Breast cancer ([place]) 07/06/2018  . 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[place]) 04/08/2018  . Carpal tunnel syndrome 03/16/2018  .  Obesity 08/24/2017  . Plantar fasciitis 06/06/2016  . Low back pain 05/21/2016  .  Health care maintenance 12/29/2015  . Osteoarthritis of knees, bilateral 05/31/2015  . Knee pain 01/04/2015  . Dysphagia 08/16/2014  . Weight gain 08/05/2014  . DOE (dyspnea on exertion) 04/28/2014  . Frequent falls 12/23/2013  . Bilateral shoulder pain 04/07/2013  . Anemia in chronic illness 08/11/2012  . Status post trachelectomy 08/04/2012  . Chronic pain 01/20/2012  . History of head and neck cancer 09/26/2011  . Hx of radiation therapy   . Tracheitis 07/17/2011  . Seizure ([place]) 07/17/2011  . Hypothyroidism 03/19/2011  . Essential hypertension 03/19/2011  . 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

## 2019-12-28 NOTE — Patient Instructions (Signed)
Access Code: [email or web address] URL: [email or web address] Date: 12/28/2019 Prepared by: Hilda Blades  Exercises Supine Knee Extension Stretch on Towel Roll - 3 x daily - 7 x weekly - 3-5 minutes hold Supine Calf Stretch with Strap - 3 x daily - 7 x weekly - 3 reps - 20 seconds hold Supine Quad Set - 3 x daily - 7 x weekly - 10 reps - 5 seconds hold Active Straight Leg Raise with Quad Set - 3 x daily - 7 x weekly - 10 reps Supine Heel Slide with Strap - 3 x daily - 7 x weekly - 10 reps - 5 seconds hold Seated Hamstring Stretch - 3 x daily - 7 x weekly - 3 reps - 20 seconds hold Seated Knee Flexion Extension AROM - 3 x daily - 7 x weekly - 10 reps - 5 seconds hold

## 2020-01-04 ENCOUNTER — Other Ambulatory Visit: Payer: Self-pay

## 2020-01-04 ENCOUNTER — Ambulatory Visit: Payer: Medicaid Other | Admitting: Physical Therapy

## 2020-01-04 DIAGNOSIS — R600 Localized edema: Secondary | ICD-10-CM

## 2020-01-04 DIAGNOSIS — M25661 Stiffness of right knee, not elsewhere classified: Secondary | ICD-10-CM

## 2020-01-04 DIAGNOSIS — G8929 Other chronic pain: Secondary | ICD-10-CM

## 2020-01-04 DIAGNOSIS — R2689 Other abnormalities of gait and mobility: Secondary | ICD-10-CM

## 2020-01-04 DIAGNOSIS — M6281 Muscle weakness (generalized): Secondary | ICD-10-CM | POA: Diagnosis not present

## 2020-01-04 DIAGNOSIS — M25561 Pain in right knee: Secondary | ICD-10-CM | POA: Diagnosis not present

## 2020-01-04 NOTE — Patient Instructions (Signed)
Access Code: [email or web address] URL: [email or web address] Date: 01/04/2020 Prepared by: Hessie Diener  Exercises Sidelying Hip Abduction - 2 x daily - 7 x weekly - 2-3 sets - 10 reps Prone Hip Extension - 2 x daily - 7 x weekly - 2-3 sets - 10 reps

## 2020-01-04 NOTE — Therapy (Signed)
[place], [place], [address] Phone: [phone]   Fax:  [phone]  Physical Therapy Treatment  Patient Details  Name: Deborah [name] MRN: [id number] Date of Birth: Nov 02, 1955 Referring Provider (PT): Aundra Dubin, [place]   Encounter Date: 01/04/2020   PT End of Session - 01/04/20 0921    Visit Number 2    Number of Visits 17    Date for PT Re-Evaluation 02/22/20    Authorization Type [employer] BLUE    PT Start Time 0850    PT Stop Time 0938    PT Time Calculation (min) 48 min           Past Medical History:  Diagnosis Date  . Anemia    h/o of  . Arthritis    in knees  . Asthma   . Breast cancer ([place])   . Bronchitis   . COPD (chronic obstructive pulmonary disease) ([place])   . Diverticulosis 11/15/2012    noted on screening colonoscopy   . Dyspnea    with COPD exerbation  . Esophageal stricture   . Former smoker 03/19/2011  . GERD (gastroesophageal reflux disease)   . Heart murmur    asymptomatic   . History of laryngectomy   . History of radiation therapy 11/15/18- 12/06/18   Right Breast total dose 42.56 Gy in 16 fractions.   [name] Hx of radiation therapy 09/03/10 to 10/16/2010   supraglottic larynx  . Hypertension   . 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 Internal hemorrhoid 11/15/2012    small, noted on screening colonoscopy   . 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  . Leukocytopenia   . Nausea alone 07/28/2013  . Neck pain 01/21/2012  . Normal MRI 07/14/11   negative for mestasis   . Pneumonia 2012  . Sciatica   . Seizures ([place])    07/24/11 off Effexor w/o seizure  . Sepsis ([place]) 08/04/12  . Sinusitis, chronic 07/20/2011   Bilateral maxillary, identified on MRI of head 07/14/11.    . Tracheostomy dependent [facility])     Past Surgical History:  Procedure Laterality Date  . BREAST LUMPECTOMY Right 07/06/2018  . BREAST LUMPECTOMY WITH  RADIOACTIVE SEED LOCALIZATION Right 07/06/2018   Procedure: RIGHT BREAST LUMPECTOMY WITH RADIOACTIVE SEED LOCALIZATION;  Surgeon: Rolm Bookbinder, MD;  Location: [facility];  Service: General;  Laterality: Right;  . COLONOSCOPY N/A 11/15/2012   Procedure: COLONOSCOPY;  Surgeon: Lafayette Dragon, MD;  Location: [facility];  Service: Endoscopy;  Laterality: N/A;  . DENTAL RESTORATION/EXTRACTION WITH X-RAY    . ESOPHAGEAL DILATION N/A 09/09/2019   Procedure: ESOPHAGEAL DILATION;  Surgeon: [name], MD;  Location: [facility];  Service: ENT;  Laterality: N/A;  . ESOPHAGEAL DILATION N/A 10/05/2019   Procedure: ESOPHAGEAL DILATION;  Surgeon: [name], MD;  Location: [facility];  Service: ENT;  Laterality: N/A;  via tracheostomy  . ESOPHAGOGASTRODUODENOSCOPY (EGD) WITH PROPOFOL N/A 08/12/2018   Procedure: ESOPHAGOGASTRODUODENOSCOPY (EGD) WITH PROPOFOL;  Surgeon: Doran Stabler, MD;  Location: [facility];  Service: Gastroenterology;  Laterality: N/A;  . ESOPHAGOSCOPY  06/21/2012   Procedure: ESOPHAGOSCOPY;  Surgeon: [name], MD;  Location: [facility];  Service: ENT;  Laterality: N/A;  . ESOPHAGOSCOPY WITH DILITATION N/A 09/21/2014   Procedure: ESOPHAGOSCOPY WITH DILITATION;  Surgeon: [name], MD;  Location: [facility];  Service: ENT;  Laterality: N/A;  . ESOPHAGOSCOPY WITH  DILITATION N/A 07/04/2016   Procedure: ESOPHAGOSCOPY WITH DILITATION;  Surgeon: [name], MD;  Location: [facility];  Service: ENT;  Laterality: N/A;  . ESOPHAGOSCOPY WITH DILITATION N/A 12/01/2017   Procedure: ESOPHAGOSCOPY WITH DILITATION;  Surgeon: [name], MD;  Location: [facility];  Service: ENT;  Laterality: N/A;  . ESOPHAGOSCOPY WITH DILITATION N/A 04/19/2018   Procedure: ESOPHAGOSCOPY WITH DILITATION;  Surgeon: [name], MD;  Location: [facility];  Service: ENT;  Laterality: N/A;  . ESOPHAGOSCOPY WITH DILITATION N/A 03/07/2019   Procedure: Esophagoscopy with dilatation;  Surgeon:  [name], MD;  Location: [facility];  Service: ENT;  Laterality: N/A;  . FLEXIBLE BRONCHOSCOPY  01/08/2018      . FOREIGN BODY REMOVAL BRONCHIAL  10/02/2011   Procedure: REMOVAL FOREIGN BODY BRONCHIAL;  Surgeon: Ruby Cola, MD;  Location: [facility];  Service: ENT;  Laterality: N/A;  . FOREIGN BODY REMOVAL BRONCHIAL N/A 01/08/2018   Procedure: REMOVAL FOREIGN BODY BRONCHIAL;  Surgeon: Jodi Marble, MD;  Location: [facility];  Service: ENT;  Laterality: N/A;  . LARYNGECTOMY    . Porta cath removal    . PORTACATH PLACEMENT  09/17/10   Tip in cavoatrial junction  . RE-EXCISION OF BREAST CANCER,SUPERIOR MARGINS Right 07/29/2018   Procedure: RE-EXCISION OF RIGHT BREAST MEDIAL MARGINS;  Surgeon: Rolm Bookbinder, MD;  Location: [facility];  Service: General;  Laterality: Right;  . RIGID ESOPHAGOSCOPY N/A 03/19/2019   Procedure: FLEXIBLE ESOPHAGOSCOPY;  Surgeon: Jodi Marble, MD;  Location: [facility];  Service: ENT;  Laterality: N/A;  . STOMAPLASTY N/A 10/21/2016   Procedure: Zola Button;  Surgeon: [name], MD;  Location: [facility];  Service: ENT;  Laterality: N/A;  . TOTAL KNEE ARTHROPLASTY Right 12/12/2019   Procedure: RIGHT TOTAL KNEE ARTHROPLASTY;  Surgeon: Leandrew Koyanagi, MD;  Location: [facility];  Service: Orthopedics;  Laterality: Right;  . TRACHEAL DILITATION  07/16/2011   Procedure: TRACHEAL DILITATION;  Surgeon: Beckie Salts, MD;  Location: [facility];  Service: ENT;  Laterality: N/A;  dilation of tracheal stoma and replacement of stoma tube  . TUBAL LIGATION  1982    There were no vitals filed for this visit.       [facility] PT Assessment - 01/04/20 0001      AROM   Right Knee Extension -8    Right Knee Flexion 118      PROM   PROM Assessment Site Knee    Right/Left Knee Right    Right Knee Extension -5                         [facility] Adult PT Treatment/Exercise - 01/04/20 0001      Knee/Hip Exercises: Stretches   Passive Hamstring Stretch 3 reps;20 seconds     Passive Hamstring Stretch Limitations seated edge of mat    Gastroc Stretch 2 reps;20 seconds    Gastroc Stretch Limitations supine with strap      Knee/Hip Exercises: Aerobic   Nustep L4 UE/LE x 5 minutes       Knee/Hip Exercises: Seated   Heel Slides 10 reps    Heel Slides Limitations knee flexion/extension AROM      Knee/Hip Exercises: Supine   Quad Sets 20 reps   5 sec hold   Quad Sets Limitations heel propped     Heel Slides 10 reps      Knee/Hip Exercises: Sidelying   Hip ABduction 15 reps      Knee/Hip Exercises: Prone  Hamstring Curl 15 reps    Hip Extension 10 reps      Modalities   Modalities Cryotherapy      Cryotherapy   Number Minutes Cryotherapy 10 Minutes    Cryotherapy Location Knee    Type of Cryotherapy Ice pack      Manual Therapy   Manual Therapy Joint mobilization    Joint Mobilization A/P mobs supine for knee flexion and extension ROM, followed by PROM                   PT Education - 01/04/20 0927    Education Details HEP    Person(s) Educated Patient    Methods Explanation;Handout    Comprehension Verbalized understanding            PT Short Term Goals - 12/28/19 0924      PT SHORT TERM GOAL #1   Title Patient will be I with initial HEP to progress with PT    Baseline HEP provided at evaluation and cues required    Time 3    Period Weeks    Status New    Target Date 01/18/20      PT SHORT TERM GOAL #2   Title Patient will exhibit improved AROM knee flexion to >/= 110 deg and knee extension to </= 5 deg lacking from neutral to improve gait and transfers    Baseline AROM knee flexion: 95, extension: 8 lacking from neutral    Time 3    Period Weeks    Status New    Target Date 01/18/20      PT SHORT TERM GOAL #3   Title Patient will report </= 6/10 pain level with activity to improve functional ability    Baseline 10/10 pain level with activity    Time 3    Period Weeks    Status New    Target Date 01/18/20              PT Long Term Goals - 12/28/19 1334      PT LONG TERM GOAL #1   Title Patient will be I with final HEP to maintain progress from PT    Time 8    Period Weeks    Status New    Target Date 02/22/20      PT LONG TERM GOAL #2   Title Patient will demonstrated right knee AROM WFL to all for normalalized transfers and gait    Time 8    Period Weeks    Status New    Target Date 02/22/20      PT LONG TERM GOAL #3   Title Patient will exhibit right knee strength grossly 5/5 to improve stair negotiation and activity tolerance    Time 8    Period Weeks    Status New    Target Date 02/22/20      PT LONG TERM GOAL #4   Title Patient will report no limitation with walking or standing    Time 8    Period Weeks    Status New    Target Date 02/22/20                 Plan - 01/04/20 0927    Clinical Impression Statement pt arrives with improved knee flexion to 118 degrees. She lacks extension by 8 degrees. Reviewed HEP and began aerobic machnes. Progressd HEP with hip strengthening. Session tolerated well with min increased pain during PROM.    PT Next Visit  Plan Assess HEP and progress PRN, nustep (try bike) , manual and stretching for improving knee motion, progress strength as tolerated, gait and balance training, vaso    PT Home Exercise Plan [email or web address]: heel prop, quad set, SLR, supine calf stretch with strap, supine heel slide with strap, seated hamstring stretch, seated knee AROM      Access Code: [email or web address] Abduction -Hip Extension           Patient will benefit from skilled therapeutic intervention in order to improve the following deficits and impairments:  Abnormal gait, Decreased range of motion, Difficulty walking, Pain, Decreased activity tolerance, Decreased balance, Decreased strength, Improper body mechanics, Impaired flexibility  Visit Diagnosis: Chronic pain of right knee  Stiffness of right knee, not elsewhere classified  Muscle weakness  (generalized)  Other abnormalities of gait and mobility  Localized edema     Problem List Patient Active Problem List   Diagnosis Date Noted  . Status post total right knee replacement 12/12/2019  . Primary osteoarthritis of right knee 12/11/2019  . Epidermal cyst of neck 07/21/2019  . Localized swelling, mass and lump, neck 07/20/2019  . Allergic rhinitis 05/20/2019  . GERD (gastroesophageal reflux disease)   . COPD (chronic obstructive pulmonary disease) ([place])   . Chronic diastolic (congestive) heart failure ([place])   . Foreign body aspiration   . Breast cancer ([place]) 07/06/2018  . 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[place]) 04/08/2018  . Carpal tunnel syndrome 03/16/2018  . Obesity 08/24/2017  . Plantar fasciitis 06/06/2016  . Low back pain 05/21/2016  . Health care maintenance 12/29/2015  . Osteoarthritis of knees, bilateral 05/31/2015  . Knee pain 01/04/2015  . Dysphagia 08/16/2014  . Weight gain 08/05/2014  . DOE (dyspnea on exertion) 04/28/2014  . Frequent falls 12/23/2013  . Bilateral shoulder pain 04/07/2013  . Anemia in chronic illness 08/11/2012  . Status post trachelectomy 08/04/2012  . Chronic pain 01/20/2012  . History of head and neck cancer 09/26/2011  . Hx of radiation therapy   . Tracheitis 07/17/2011  . Seizure ([place]) 07/17/2011  . Hypothyroidism 03/19/2011  . Essential hypertension 03/19/2011  . 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 [place], [place], [address] Phone: [phone]   Fax:  [phone]  Name: Deborah [name] MRN: [id number] Date of Birth: September 30, 1955

## 2020-01-09 ENCOUNTER — Other Ambulatory Visit: Payer: Self-pay

## 2020-01-09 ENCOUNTER — Ambulatory Visit: Payer: Medicaid Other

## 2020-01-09 ENCOUNTER — Other Ambulatory Visit: Payer: Self-pay | Admitting: Family Medicine

## 2020-01-09 DIAGNOSIS — M25561 Pain in right knee: Secondary | ICD-10-CM

## 2020-01-09 DIAGNOSIS — R2689 Other abnormalities of gait and mobility: Secondary | ICD-10-CM | POA: Diagnosis not present

## 2020-01-09 DIAGNOSIS — R600 Localized edema: Secondary | ICD-10-CM | POA: Diagnosis not present

## 2020-01-09 DIAGNOSIS — J302 Other seasonal allergic rhinitis: Secondary | ICD-10-CM

## 2020-01-09 DIAGNOSIS — G8929 Other chronic pain: Secondary | ICD-10-CM

## 2020-01-09 DIAGNOSIS — M25661 Stiffness of right knee, not elsewhere classified: Secondary | ICD-10-CM

## 2020-01-09 DIAGNOSIS — E039 Hypothyroidism, unspecified: Secondary | ICD-10-CM

## 2020-01-09 DIAGNOSIS — M6281 Muscle weakness (generalized): Secondary | ICD-10-CM

## 2020-01-09 NOTE — Progress Notes (Signed)
Patient Care Team: Benay Pike, MD as PCP - General (Family Medicine) [name], MD as Attending Physician (Otolaryngology) Kyung Rudd, MD (Radiation Oncology) Heath Lark, MD as Consulting Physician (Hematology and Oncology) Rolm Bookbinder, MD as Consulting Physician (General Surgery) Nicholas Lose, MD as Consulting Physician (Hematology and Oncology) Eppie Gibson, MD as Attending Physician (Radiation Oncology)  DIAGNOSIS:    ICD-10-CM   1. 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     SUMMARY OF ONCOLOGIC HISTORY: Oncology History  Ductal carcinoma in situ (DCIS) of right breast  05/26/2018 Initial Diagnosis   History of laryngeal cancer treated with chemoradiation, screening detected right breast calcifications medial right breast measuring 1.9 cm, biopsy revealed high-grade DCIS with comedonecrosis and calcifications ER 90%, PR 90%, Tis NX stage 0   06/02/2018 Cancer Staging   Staging form: Breast, AJCC 8th Edition - Clinical: Stage 0 (cTis (DCIS), cN0, cM0, ER+, PR+, HER2: Not Assessed) - Signed by Nicholas Lose, MD on 06/02/2018   07/06/2018 Surgery   Right lumpectomy: No residual carcinoma identified, right medial margin excision: DCIS high-grade 1.5 cm margins negative, additional margins benign, ER 90%, PR 90%, Tis NX stage 0   09/06/2018 -  Anti-estrogen oral therapy   Anastrozole 77m daily   11/16/2018 - 12/06/2018 Radiation Therapy   Adjuvant radiation     CHIEF COMPLIANT: Follow-up of right breast DCIS on anastrozole  INTERVAL HISTORY: [name] a 64y.o. with above-mentioned history of right breast DCIS who underwent lumpectomy with re-excision, radiation, and is currently on antiestrogen therapy with anastrozole. Mammogram on 05/23/19 showed no evidence of malignancy bilaterally. Bone density scan on 03/31/19 showed normal bone density, with a T-score of -0.7 at the right femur neck. She presents to the clinic today for  follow-up.  ALLERGIES:  is allergic to lisinopril.  MEDICATIONS:  Current Outpatient Medications  Medication Sig Dispense Refill   albuterol (PROVENTIL) (2.5 MG/3ML) 0.083% nebulizer solution Take 3 mLs (2.5 mg total) by nebulization every 6 (six) hours as needed for wheezing or shortness of breath. 150 mL 1   amLODipine (NORVASC) 10 MG tablet Take 1 tablet (10 mg total) by mouth daily. 90 tablet 3   anastrozole (ARIMIDEX) 1 MG tablet Take 1 tablet by mouth daily (Patient taking differently: Take 1 mg by mouth daily. ) 90 tablet 0   aspirin EC 81 MG tablet Take 1 tablet (81 mg total) by mouth 2 (two) times daily. 84 tablet 0   benzonatate (TESSALON) 100 MG capsule TAKE 1 CAPSULE BY MOUTH TWICE DAILY AS NEEDED FOR COUGH 20 capsule 0   buPROPion (WELLBUTRIN XL) 150 MG 24 hr tablet Take 1 tablet (150 mg total) by mouth daily. 90 tablet 3   cetirizine (ZYRTEC) 10 MG tablet Take 1 tablet by mouth once daily 30 tablet 0   EUTHYROX 175 MCG tablet TAKE 1 TABLET BY MOUTH ONCE DAILY BEFORE BREAKFAST 30 tablet 0   losartan (COZAAR) 100 MG tablet Take 1 tablet (100 mg total) by mouth daily. 30 tablet 3   meloxicam (MOBIC) 15 MG tablet TAKE 1 TABLET BY MOUTH ONCE DAILY AS NEEDED 30 tablet 0   methocarbamol (ROBAXIN) 750 MG tablet Take 1 tablet (750 mg total) by mouth 2 (two) times daily as needed for muscle spasms. 20 tablet 3   metoprolol tartrate (LOPRESSOR) 50 MG tablet Take 1 tablet (50 mg total) by mouth 2 (two) times daily. 90 tablet 2   mometasone-formoterol (DULERA) 200-5 MCG/ACT AERO  Inhale 2 puffs into the lungs 2 (two) times daily. (Patient taking differently: Inhale 2 puffs into the lungs daily. ) 13 g 0   nystatin cream (MYCOSTATIN) APPLY  CREAM TOPICALLY TWICE DAILY AS NEEDED (Patient taking differently: Apply 1 application topically daily as needed for dry skin. ) 30 g 0   omeprazole (PRILOSEC) 40 MG capsule Take 1 capsule (40 mg total) by mouth 2 (two) times daily. 90 capsule 0    ondansetron (ZOFRAN) 4 MG tablet Take 1-2 tablets (4-8 mg total) by mouth every 8 (eight) hours as needed for nausea or vomiting. 20 tablet 0   oxyCODONE-acetaminophen (PERCOCET) 5-325 MG tablet Take 1-2 tablets by mouth every 8 (eight) hours as needed for severe pain. 30 tablet 0   PROVENTIL HFA 108 (90 Base) MCG/ACT inhaler INHALE 2 PUFFS BY MOUTH EVERY 4 HOURS AS NEEDED OR SHORTNESS OF BREATH (Patient taking differently: Inhale 2 puffs into the lungs every 4 (four) hours as needed for wheezing or shortness of breath. ) 14 g 2   senna-docusate (SENOKOT S) 8.6-50 MG tablet Take 1-2 tablets by mouth at bedtime as needed. 30 tablet 1   tiotropium (SPIRIVA) 18 MCG inhalation capsule Place 18 mcg into inhaler and inhale daily as needed (shortness of breath).      TRANSDERM-SCOP, 1.5 MG, 1 MG/3DAYS PLACE 1 PATCH ONTO THE SKIN EVERY 3 DAYS AS NEEDED (Patient taking differently: Place 1 patch onto the skin daily as needed (Nausea). ) 10 patch 2   No current facility-administered medications for this visit.    PHYSICAL EXAMINATION: Tracheostomy ECOG PERFORMANCE STATUS: 1 - Symptomatic but completely ambulatory  Vitals:   01/10/20 1410  BP: 120/67  Pulse: 81  Resp: 17  Temp: 98.9 F (37.2 C)  SpO2: 95%   Filed Weights   01/10/20 1410  Weight: (!) 214 lb 3.2 oz (97.2 kg)    BREAST: No palpable masses or nodules in either right or left breasts. No palpable axillary supraclavicular or infraclavicular adenopathy no breast tenderness or nipple discharge. (exam performed in the presence of a chaperone)  LABORATORY DATA:  I have reviewed the data as listed CMP Latest Ref Rng & Units 12/13/2019 12/08/2019 09/06/2019  Glucose 70 - 99 mg/dL 118(H) 104(H) 82  BUN 8 - 23 mg/dL _0 Creatinine 0.44 - 1.00 mg/dL 0.92 0.88 0.88  Sodium 135 - 145 mmol/L 140 138 140  Potassium 3.5 - 5.1 mmol/L 4.5 3.7 4.2  Chloride 98 - 111 mmol/L 106 103 104  CO2 22 - 32 mmol/L _1 Calcium 8.9 - 10.3  mg/dL 9.1 9.6 9.3  Total Protein 6.5 - 8.1 g/dL - - -  Total Bilirubin 0.3 - 1.2 mg/dL - - -  Alkaline Phos 38 - 126 U/L - - -  AST 15 - 41 U/L - - -  ALT 0 - 44 U/L - - -    Lab Results  Component Value Date   WBC 7.9 12/13/2019   HGB 11.7 (L) 12/13/2019   HCT 36.9 12/13/2019   MCV 92.5 12/13/2019   PLT 352 12/13/2019   NEUTROABS 2.6 06/02/2018    ASSESSMENT & PLAN:  Ductal carcinoma in situ (DCIS) of right breast 07/05/2018:Right lumpectomy: No residual carcinoma identified, right medial margin excision: DCIS high-grade 1.5 cm margins negative, additional margins benign, ER 90%, PR 90%, Tis NX stage 0  Current treatment: Anastrozole daily began in 08/2018 Anastrozole toxicities: Denies any adverse effects like hot flashes or myalgias or  arthralgias.  Breast cancer surveillance: 1. Mammogram in 05/2019 benign 2. Bone density 03/2019 normal 3.  Breast exam 01/10/2020: Benign  Return to clinic in 1 year for follow-up    No orders of the defined types were placed in this encounter.  The patient has a good understanding of the overall plan. she agrees with it. she will call with any problems that may develop before the next visit here.  Total time spent: 20 mins including face to face time and time spent for planning, charting and coordination of care  Nicholas Lose, MD 01/10/2020  I, Cloyde Reams Dorshimer, am acting as scribe for Dr. Nicholas Lose.  I have reviewed the above documentation for accuracy and completeness, and I agree with the above.

## 2020-01-09 NOTE — Therapy (Signed)
[place], [place], [address] Phone: [phone]   Fax:  [phone]  Physical Therapy Treatment  Patient Details  Name: Deborah [name] MRN: [id number] Date of Birth: May 26, 1956 Referring Provider (PT): Aundra Dubin, [place]   Encounter Date: 01/09/2020   PT End of Session - 01/09/20 0934    Visit Number 3    Number of Visits 17    Date for PT Re-Evaluation 02/22/20    Authorization Type [employer]UE    PT Start Time [phone]   pt arrived late   PT Stop Time 1010    PT Time Calculation (min) 45 min    Activity Tolerance Patient tolerated treatment well    Behavior During Therapy [facility] for tasks assessed/performed           Past Medical History:  Diagnosis Date  . Anemia    h/o of  . Arthritis    in knees  . Asthma   . Breast cancer ([place])   . Bronchitis   . COPD (chronic obstructive pulmonary disease) ([place])   . Diverticulosis 11/15/2012    noted on screening colonoscopy   . Dyspnea    with COPD exerbation  . Esophageal stricture   . Former smoker 03/19/2011  . GERD (gastroesophageal reflux disease)   . Heart murmur    asymptomatic   . History of laryngectomy   . History of radiation therapy 11/15/18- 12/06/18   Right Breast total dose 42.56 Gy in 16 fractions.   [name] Hx of radiation therapy 09/03/10 to 10/16/2010   supraglottic larynx  . Hypertension   . 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 Internal hemorrhoid 11/15/2012    small, noted on screening colonoscopy   . 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  . Leukocytopenia   . Nausea alone 07/28/2013  . Neck pain 01/21/2012  . Normal MRI 07/14/11   negative for mestasis   . Pneumonia 2012  . Sciatica   . Seizures ([place])    07/24/11 off Effexor w/o seizure  . Sepsis ([place]) 08/04/12  . Sinusitis, chronic 07/20/2011   Bilateral maxillary, identified on MRI of head 07/14/11.    . Tracheostomy  dependent [facility])     Past Surgical History:  Procedure Laterality Date  . BREAST LUMPECTOMY Right 07/06/2018  . BREAST LUMPECTOMY WITH RADIOACTIVE SEED LOCALIZATION Right 07/06/2018   Procedure: RIGHT BREAST LUMPECTOMY WITH RADIOACTIVE SEED LOCALIZATION;  Surgeon: Rolm Bookbinder, MD;  Location: [place];  Service: General;  Laterality: Right;  . COLONOSCOPY N/A 11/15/2012   Procedure: COLONOSCOPY;  Surgeon: Lafayette Dragon, MD;  [facility]: [facility];  Service: Endoscopy;  Laterality: N/A;  . DENTAL RESTORATION/EXTRACTION WITH X-RAY    . ESOPHAGEAL DILATION N/A 09/09/2019   Procedure: ESOPHAGEAL DILATION;  Surgeon: [name], MD;  Location: [place];  Service: ENT;  Laterality: N/A;  . ESOPHAGEAL DILATION N/A 10/05/2019   Procedure: ESOPHAGEAL DILATION;  Surgeon: [name], MD;  Location: [place];  Service: ENT;  Laterality: N/A;  via tracheostomy  . ESOPHAGOGASTRODUODENOSCOPY (EGD) WITH PROPOFOL N/A 08/12/2018   Procedure: ESOPHAGOGASTRODUODENOSCOPY (EGD) WITH PROPOFOL;  Surgeon: Doran Stabler, MD;  Location: [place];  Service: Gastroenterology;  Laterality: N/A;  . ESOPHAGOSCOPY  06/21/2012   Procedure: ESOPHAGOSCOPY;  Surgeon: [name], MD;  Location: [place];  Service: ENT;  Laterality: N/A;  . ESOPHAGOSCOPY WITH DILITATION N/A 09/21/2014  Procedure: ESOPHAGOSCOPY WITH DILITATION;  Surgeon: [name], MD;  Location: [place];  Service: ENT;  Laterality: N/A;  . ESOPHAGOSCOPY WITH DILITATION N/A 07/04/2016   Procedure: ESOPHAGOSCOPY WITH DILITATION;  Surgeon: [name], MD;  Location: [place];  Service: ENT;  Laterality: N/A;  . ESOPHAGOSCOPY WITH DILITATION N/A 12/01/2017   Procedure: ESOPHAGOSCOPY WITH DILITATION;  Surgeon: [name], MD;  Location: [place];  Service: ENT;  Laterality: N/A;  . ESOPHAGOSCOPY WITH DILITATION N/A 04/19/2018   Procedure: ESOPHAGOSCOPY WITH DILITATION;  Surgeon: [name], MD;  Location: [place];   Service: ENT;  Laterality: N/A;  . ESOPHAGOSCOPY WITH DILITATION N/A 03/07/2019   Procedure: Esophagoscopy with dilatation;  Surgeon: [name], MD;  Location: [place];  Service: ENT;  Laterality: N/A;  . FLEXIBLE BRONCHOSCOPY  01/08/2018      . FOREIGN BODY REMOVAL BRONCHIAL  10/02/2011   Procedure: REMOVAL FOREIGN BODY BRONCHIAL;  Surgeon: Ruby Cola, MD;  Location: [place];  Service: ENT;  Laterality: N/A;  . FOREIGN BODY REMOVAL BRONCHIAL N/A 01/08/2018   Procedure: REMOVAL FOREIGN BODY BRONCHIAL;  Surgeon: Jodi Marble, MD;  Location: [facility];  Service: ENT;  Laterality: N/A;  . LARYNGECTOMY    . Porta cath removal    . PORTACATH PLACEMENT  09/17/10   Tip in cavoatrial junction  . RE-EXCISION OF BREAST CANCER,SUPERIOR MARGINS Right 07/29/2018   Procedure: RE-EXCISION OF RIGHT BREAST MEDIAL MARGINS;  Surgeon: Rolm Bookbinder, MD;  Location: [place];  Service: General;  Laterality: Right;  . RIGID ESOPHAGOSCOPY N/A 03/19/2019   Procedure: FLEXIBLE ESOPHAGOSCOPY;  Surgeon: Jodi Marble, MD;  Location: [place];  Service: ENT;  Laterality: N/A;  . STOMAPLASTY N/A 10/21/2016   Procedure: Zola Button;  Surgeon: [name], MD;  Location: [place];  Service: ENT;  Laterality: N/A;  . TOTAL KNEE ARTHROPLASTY Right 12/12/2019   Procedure: RIGHT TOTAL KNEE ARTHROPLASTY;  Surgeon: Leandrew Koyanagi, MD;  Location: [place];  Service: Orthopedics;  Laterality: Right;  . TRACHEAL DILITATION  07/16/2011   Procedure: TRACHEAL DILITATION;  Surgeon: Beckie Salts, MD;  Location: [place];  Service: ENT;  Laterality: N/A;  dilation of tracheal stoma and replacement of stoma tube  . TUBAL LIGATION  1982    There were no vitals filed for this visit.   Subjective Assessment - 01/09/20 0940    Subjective Pt reports pain was initially a 10/10 when she walked in and went down to a 2/2 after exercises.    Limitations Standing;Walking;House hold activities;Sitting;Lifting    How long can you sit  comfortably? No limitation - knee will get stiff    How long can you stand comfortably? Patient reports she can stand as long as she needs to but is not comfortable    How long can you walk comfortably? Patient hasn't tried walking long distances - she is more limited from breathing    Patient Stated Goals Get rid of the pain and back to walking    Currently in Pain? Yes    Pain Score 10-Worst pain ever    Pain Location Knee    Pain Orientation Right    Pain Onset 1 to 4 weeks ago                             [facility] Adult PT Treatment/Exercise - 01/09/20 0001      Knee/Hip Exercises: Stretches   Passive Hamstring Stretch 3 reps;20 seconds  Passive Hamstring Stretch Limitations seated edge of mat    Gastroc Stretch 2 reps;20 seconds    Gastroc Stretch Limitations supine with strap      Knee/Hip Exercises: Aerobic   Recumbent Bike Lvl 5, 5 min      Knee/Hip Exercises: Seated   Heel Slides 10 reps    Heel Slides Limitations knee flexion/extension AROM      Knee/Hip Exercises: Supine   Quad Sets 20 reps   5 sec hold   Quad Sets Limitations heel propped    Heel Slides 10 reps    Bridges Strengthening;Both;1 set;10 reps    Bridges Limitations RTB    Other Supine Knee/Hip Exercises supine PPT with RTB above knees to prep for bridge to maximize glute activation x 10      Knee/Hip Exercises: Sidelying   Hip ABduction 15 reps      Knee/Hip Exercises: Prone   Hamstring Curl 15 reps    Hamstring Curl Limitations VCs for hip ext     Hip Extension 10 reps    Hip Extension Limitations 2 sec                    PT Short Term Goals - 12/28/19 4982      PT SHORT TERM GOAL #1   Title Patient will be I with initial HEP to progress with PT    Baseline HEP provided at evaluation and cues required    Time 3    Period Weeks    Status New    Target Date 01/18/20      PT SHORT TERM GOAL #2   Title Patient will exhibit improved AROM knee flexion to >/= 110 deg  and knee extension to </= 5 deg lacking from neutral to improve gait and transfers    Baseline AROM knee flexion: 95, extension: 8 lacking from neutral    Time 3    Period Weeks    Status New    Target Date 01/18/20      PT SHORT TERM GOAL #3   Title Patient will report </= 6/10 pain level with activity to improve functional ability    Baseline 10/10 pain level with activity    Time 3    Period Weeks    Status New    Target Date 01/18/20             PT Long Term Goals - 12/28/19 1334      PT LONG TERM GOAL #1   Title Patient will be I with final HEP to maintain progress from PT    Time 8    Period Weeks    Status New    Target Date 02/22/20      PT LONG TERM GOAL #2   Title Patient will demonstrated right knee AROM WFL to all for normalalized transfers and gait    Time 8    Period Weeks    Status New    Target Date 02/22/20      PT LONG TERM GOAL #3   Title Patient will exhibit right knee strength grossly 5/5 to improve stair negotiation and activity tolerance    Time 8    Period Weeks    Status New    Target Date 02/22/20      PT LONG TERM GOAL #4   Title Patient will report no limitation with walking or standing    Time 8    Period Weeks    Status New  Target Date 02/22/20                 Plan - 01/09/20 0940    Clinical Impression Statement Pt presents a little late with 10/10 pain. Pt reported reduction to 8/10 during exercise. Additional c/o nighttime medial burning reported with recommendation to try heat at home before bed. Pt tolerated tx well with ability to progress to bridging with RTB, maintaining posterior pelvic tilt. Pt provided with band to take home.    PT Next Visit Plan Assess HEP and progress PRN, bike, manual and stretching for improving knee motion, progress strength as tolerated, gait and balance training, vaso/ice    PT Home Exercise Plan [email or web address]: heel prop, quad set, SLR, supine calf stretch with strap, supine heel slide with  strap, seated hamstring stretch, seated knee AROM      Access Code: [email or web address] Abduction -Hip Extension           Patient will benefit from skilled therapeutic intervention in order to improve the following deficits and impairments:  Abnormal gait, Decreased range of motion, Difficulty walking, Pain, Decreased activity tolerance, Decreased balance, Decreased strength, Improper body mechanics, Impaired flexibility  Visit Diagnosis: Chronic pain of right knee  Stiffness of right knee, not elsewhere classified  Muscle weakness (generalized)  Other abnormalities of gait and mobility  Localized edema     Problem List Patient Active Problem List   Diagnosis Date Noted  . Status post total right knee replacement 12/12/2019  . Primary osteoarthritis of right knee 12/11/2019  . Epidermal cyst of neck 07/21/2019  . Localized swelling, mass and lump, neck 07/20/2019  . Allergic rhinitis 05/20/2019  . GERD (gastroesophageal reflux disease)   . COPD (chronic obstructive pulmonary disease) ([place])   . Chronic diastolic (congestive) heart failure ([place])   . Foreign body aspiration   . Breast cancer ([place]) 07/06/2018  . 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[place]) 04/08/2018  . Carpal tunnel syndrome 03/16/2018  . Obesity 08/24/2017  . Plantar fasciitis 06/06/2016  . Low back pain 05/21/2016  . Health care maintenance 12/29/2015  . Osteoarthritis of knees, bilateral 05/31/2015  . Knee pain 01/04/2015  . Dysphagia 08/16/2014  . Weight gain 08/05/2014  . DOE (dyspnea on exertion) 04/28/2014  . Frequent falls 12/23/2013  . Bilateral shoulder pain 04/07/2013  . Anemia in chronic illness 08/11/2012  . Status post trachelectomy 08/04/2012  . Chronic pain 01/20/2012  . History of head and neck cancer 09/26/2011  . Hx of radiation therapy   . Tracheitis 07/17/2011  . Seizure ([place]) 07/17/2011  . Hypothyroidism  03/19/2011  . Essential hypertension 03/19/2011  . 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

## 2020-01-10 ENCOUNTER — Telehealth: Payer: Self-pay | Admitting: Family Medicine

## 2020-01-10 ENCOUNTER — Other Ambulatory Visit: Payer: Self-pay

## 2020-01-10 ENCOUNTER — Inpatient Hospital Stay: Payer: Medicaid Other | Attending: Hematology and Oncology | Admitting: Hematology and Oncology

## 2020-01-10 DIAGNOSIS — Z923 Personal history of irradiation: Secondary | ICD-10-CM | POA: Diagnosis not present

## 2020-01-10 DIAGNOSIS — Z79899 Other long term (current) drug therapy: Secondary | ICD-10-CM | POA: Insufficient documentation

## 2020-01-10 DIAGNOSIS — Z888 Allergy status to other drugs, medicaments and biological substances status: Secondary | ICD-10-CM | POA: Diagnosis not present

## 2020-01-10 DIAGNOSIS — Z79811 Long term (current) use of aromatase inhibitors: Secondary | ICD-10-CM | POA: Diagnosis not present

## 2020-01-10 DIAGNOSIS — Z8521 Personal history of malignant neoplasm of larynx: Secondary | ICD-10-CM | POA: Diagnosis not present

## 2020-01-10 DIAGNOSIS — D0511 Intraductal carcinoma in situ of right breast: Secondary | ICD-10-CM | POA: Diagnosis not present

## 2020-01-10 MED ORDER — ANASTROZOLE 1 MG PO TABS: 1.0000 mg | ORAL_TABLET | Freq: Every day | ORAL | 3 refills | Status: DC

## 2020-01-10 NOTE — Telephone Encounter (Signed)
Pt walked in and stated she was charged full price for her medication because the pharmacy said the person who wrote the prescription was not incensed.  She is concerned that this does not happen again.

## 2020-01-10 NOTE — Assessment & Plan Note (Signed)
07/05/2018:Right lumpectomy: No residual carcinoma identified, right medial margin excision: DCIS high-grade 1.5 cm margins negative, additional margins benign, ER 90%, PR 90%, Tis NX stage 0  Current treatment: Anastrozole daily began in 08/2018 Anastrozole toxicities:  Breast cancer surveillance: 1. Mammogram in 05/2019 benign 2. Bone density 03/2019 normal 3.  Breast exam 01/10/2020: Benign  Return to clinic in 1 year for follow-up

## 2020-01-12 ENCOUNTER — Ambulatory Visit: Payer: Medicaid Other | Admitting: Physical Therapy

## 2020-01-12 ENCOUNTER — Other Ambulatory Visit: Payer: Self-pay

## 2020-01-12 ENCOUNTER — Other Ambulatory Visit: Payer: Self-pay | Admitting: Orthopaedic Surgery

## 2020-01-12 ENCOUNTER — Other Ambulatory Visit: Payer: Self-pay | Admitting: Family Medicine

## 2020-01-12 ENCOUNTER — Encounter: Payer: Self-pay | Admitting: Physical Therapy

## 2020-01-12 DIAGNOSIS — R2689 Other abnormalities of gait and mobility: Secondary | ICD-10-CM | POA: Diagnosis not present

## 2020-01-12 DIAGNOSIS — R600 Localized edema: Secondary | ICD-10-CM | POA: Diagnosis not present

## 2020-01-12 DIAGNOSIS — M6281 Muscle weakness (generalized): Secondary | ICD-10-CM | POA: Diagnosis not present

## 2020-01-12 DIAGNOSIS — M25561 Pain in right knee: Secondary | ICD-10-CM | POA: Diagnosis not present

## 2020-01-12 DIAGNOSIS — G8929 Other chronic pain: Secondary | ICD-10-CM | POA: Diagnosis not present

## 2020-01-12 DIAGNOSIS — M25661 Stiffness of right knee, not elsewhere classified: Secondary | ICD-10-CM

## 2020-01-12 NOTE — Therapy (Signed)
[place], [place], [address] Phone: [phone]   Fax:  [phone]  Physical Therapy Treatment  Patient Details  Name: Deborah [name] MRN: [id number] Date of Birth: 05/22/1956 Referring Provider (PT): Aundra Dubin, [place]   Encounter Date: 01/12/2020   PT End of Session - 01/12/20 1355    Visit Number 4    Number of Visits 17    Date for PT Re-Evaluation 02/22/20    Authorization Type [employer]    Authorization Time Period 01/04/2020 - 02/29/2020    Authorization - Visit Number 3    Authorization - Number of Visits 16    PT Start Time 1400    PT Stop Time 1445    PT Time Calculation (min) 45 min    Activity Tolerance Patient tolerated treatment well    Behavior During Therapy [facility] for tasks assessed/performed           Past Medical History:  Diagnosis Date  . Anemia    h/o of  . Arthritis    in knees  . Asthma   . Breast cancer ([place])   . Bronchitis   . COPD (chronic obstructive pulmonary disease) ([place])   . Diverticulosis 11/15/2012    noted on screening colonoscopy   . Dyspnea    with COPD exerbation  . Esophageal stricture   . Former smoker 03/19/2011  . GERD (gastroesophageal reflux disease)   . Heart murmur    asymptomatic   . History of laryngectomy   . History of radiation therapy 11/15/18- 12/06/18   Right Breast total dose 42.56 Gy in 16 fractions.   [name] Hx of radiation therapy 09/03/10 to 10/16/2010   supraglottic larynx  . Hypertension   . 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 Internal hemorrhoid 11/15/2012    small, noted on screening colonoscopy   . 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  . Leukocytopenia   . Nausea alone 07/28/2013  . Neck pain 01/21/2012  . Normal MRI 07/14/11   negative for mestasis   . Pneumonia 2012  . Sciatica   . Seizures ([place])    07/24/11 off Effexor w/o seizure  . Sepsis ([place]) 08/04/12  .  Sinusitis, chronic 07/20/2011   Bilateral maxillary, identified on MRI of head 07/14/11.    . Tracheostomy dependent [facility])     Past Surgical History:  Procedure Laterality Date  . BREAST LUMPECTOMY Right 07/06/2018  . BREAST LUMPECTOMY WITH RADIOACTIVE SEED LOCALIZATION Right 07/06/2018   Procedure: RIGHT BREAST LUMPECTOMY WITH RADIOACTIVE SEED LOCALIZATION;  Surgeon: Rolm Bookbinder, MD;  Location: [place];  Service: General;  Laterality: Right;  . COLONOSCOPY N/A 11/15/2012   Procedure: COLONOSCOPY;  Surgeon: Lafayette Dragon, MD;  [facility];  Service: Endoscopy;  Laterality: N/A;  . DENTAL RESTORATION/EXTRACTION WITH X-RAY    . ESOPHAGEAL DILATION N/A 09/09/2019   Procedure: ESOPHAGEAL DILATION;  Surgeon: [name], MD;  Location: [place];  Service: ENT;  Laterality: N/A;  . ESOPHAGEAL DILATION N/A 10/05/2019   Procedure: ESOPHAGEAL DILATION;  Surgeon: [name], MD;  Location: [place];  Service: ENT;  Laterality: N/A;  via tracheostomy  . ESOPHAGOGASTRODUODENOSCOPY (EGD) WITH PROPOFOL N/A 08/12/2018   Procedure: ESOPHAGOGASTRODUODENOSCOPY (EGD) WITH PROPOFOL;  Surgeon: Doran Stabler, MD;  Location: [place];  Service: Gastroenterology;  Laterality: N/A;  . ESOPHAGOSCOPY  06/21/2012   Procedure: ESOPHAGOSCOPY;  Surgeon: [name],  MD;  Location: [place];  Service: ENT;  Laterality: N/A;  . ESOPHAGOSCOPY WITH DILITATION N/A 09/21/2014   Procedure: ESOPHAGOSCOPY WITH DILITATION;  Surgeon: [name], MD;  Location: [place];  Service: ENT;  Laterality: N/A;  . ESOPHAGOSCOPY WITH DILITATION N/A 07/04/2016   Procedure: ESOPHAGOSCOPY WITH DILITATION;  Surgeon: [name], MD;  Location[place]  Service: ENT;  Laterality: N/A;  . ESOPHAGOSCOPY WITH DILITATION N/A 12/01/2017   Procedure: ESOPHAGOSCOPY WITH DILITATION;  Surgeon: [name], MD;  Location: [place];  Service: ENT;  Laterality: N/A;  . ESOPHAGOSCOPY WITH DILITATION  N/A 04/19/2018   Procedure: ESOPHAGOSCOPY WITH DILITATION;  Surgeon: [name], MD;  Location: [place];  Service: ENT;  Laterality: N/A;  . ESOPHAGOSCOPY WITH DILITATION N/A 03/07/2019   Procedure: Esophagoscopy with dilatation;  Surgeon: [name], MD;  Location: [place];  Service: ENT;  Laterality: N/A;  . FLEXIBLE BRONCHOSCOPY  01/08/2018      . FOREIGN BODY REMOVAL BRONCHIAL  10/02/2011   Procedure: REMOVAL FOREIGN BODY BRONCHIAL;  Surgeon: Ruby Cola, MD;  Location: [place];  Service: ENT;  Laterality: N/A;  . FOREIGN BODY REMOVAL BRONCHIAL N/A 01/08/2018   Procedure: REMOVAL FOREIGN BODY BRONCHIAL;  Surgeon: Jodi Marble, MD;  Location: [facility];  Service: ENT;  Laterality: N/A;  . LARYNGECTOMY    . Porta cath removal    . PORTACATH PLACEMENT  09/17/10   Tip in cavoatrial junction  . RE-EXCISION OF BREAST CANCER,SUPERIOR MARGINS Right 07/29/2018   Procedure: RE-EXCISION OF RIGHT BREAST MEDIAL MARGINS;  Surgeon: Rolm Bookbinder, MD;  Location: [place];  Service: General;  Laterality: Right;  . RIGID ESOPHAGOSCOPY N/A 03/19/2019   Procedure: FLEXIBLE ESOPHAGOSCOPY;  Surgeon: Jodi Marble, MD;  Location: [place];  Service: ENT;  Laterality: N/A;  . STOMAPLASTY N/A 10/21/2016   Procedure: Zola Button;  Surgeon: [name], MD;  Location: [place];  Service: ENT;  Laterality: N/A;  . TOTAL KNEE ARTHROPLASTY Right 12/12/2019   Procedure: RIGHT TOTAL KNEE ARTHROPLASTY;  Surgeon: Leandrew Koyanagi, MD;  Location: [place];  Service: Orthopedics;  Laterality: Right;  . TRACHEAL DILITATION  07/16/2011   Procedure: TRACHEAL DILITATION;  Surgeon: Beckie Salts, MD;  Location: [place];  Service: ENT;  Laterality: N/A;  dilation of tracheal stoma and replacement of stoma tube  . TUBAL LIGATION  1982    There were no vitals filed for this visit.   Subjective Assessment - 01/12/20 1402    Subjective Patient reports she is doing well. She is not having much pain today.    Patient Stated Goals  Get rid of the pain and back to walking    Currently in Pain? Yes    Pain Score 4     Pain Location Knee    Pain Orientation Right    Pain Descriptors / Indicators Aching;Tightness    Pain Type Surgical pain    Pain Onset More than a month ago    Pain Frequency Constant              [facility] PT Assessment - 01/12/20 0001      Assessment   Medical Diagnosis Right TKA    Referring Provider (PT) Aundra Dubin, PA-C    Onset Date/Surgical Date 12/12/19      Precautions   Precautions None      Restrictions   Weight Bearing Restrictions No      Balance Screen   Has the patient fallen in the past  6 months No      AROM   Right Knee Extension -4   lacking   Right Knee Flexion 120                         [facility] Adult PT Treatment/Exercise - 01/12/20 0001      Exercises   Exercises Knee/Hip      Knee/Hip Exercises: Stretches   Sports administrator --   with hip flexor stretch   Hip Flexor Stretch 2 reps;30 seconds    Hip Flexor Stretch Limitations supine edge of mat      Knee/Hip Exercises: Aerobic   Nustep L5 x 5 min (UE and LE)      Knee/Hip Exercises: Standing   Heel Raises 2 sets;10 reps    Knee Flexion 2 sets;10 reps    Knee Flexion Limitations red band      Knee/Hip Exercises: Seated   Long Arc Quad 2 sets;10 reps    Long Arc Quad Weight 2 lbs.    Long [employer] Limitations patient also instructed how to perform with red band for HEP    Hamstring Curl 2 sets;10 reps    Hamstring Limitations red band    Sit to [employer] 2 sets;10 reps;without UE support      Knee/Hip Exercises: Supine   Quad Sets 5 sets   5 sec hold   Quad Sets Limitations heel propped on towel roll    Bridges 10 reps    Straight Leg Raises 2 sets;10 reps      Knee/Hip Exercises: Sidelying   Hip ABduction 10 reps      Manual Therapy   Manual Therapy Joint mobilization;Passive ROM    Joint Mobilization PF and knee A/P mobs supine and seated for knee flexion and extension ROM, followed  by PROM     Passive ROM Knee flexion and extension to tolerance                  PT Education - 01/12/20 1355    Education Details HEP    Person(s) Educated Patient    Methods Explanation;Demonstration;Verbal cues    Comprehension Verbalized understanding;Returned demonstration;Verbal cues required;Need further instruction            PT Short Term Goals - 12/28/19 0924      PT SHORT TERM GOAL #1   Title Patient will be I with initial HEP to progress with PT    Baseline HEP provided at evaluation and cues required    Time 3    Period Weeks    Status New    Target Date 01/18/20      PT SHORT TERM GOAL #2   Title Patient will exhibit improved AROM knee flexion to >/= 110 deg and knee extension to </= 5 deg lacking from neutral to improve gait and transfers    Baseline AROM knee flexion: 95, extension: 8 lacking from neutral    Time 3    Period Weeks    Status New    Target Date 01/18/20      PT SHORT TERM GOAL #3   Title Patient will report </= 6/10 pain level with activity to improve functional ability    Baseline 10/10 pain level with activity    Time 3    Period Weeks    Status New    Target Date 01/18/20             PT Long Term Goals - 12/28/19  [place] #1   Title Patient will be I with final HEP to maintain progress from PT    Time 8    Period Weeks    Status New    Target Date 02/22/20      PT LONG TERM GOAL #2   Title Patient will demonstrated right knee AROM WFL to all for normalalized transfers and gait    Time 8    Period Weeks    Status New    Target Date 02/22/20      PT LONG TERM GOAL #3   Title Patient will exhibit right knee strength grossly 5/5 to improve stair negotiation and activity tolerance    Time 8    Period Weeks    Status New    Target Date 02/22/20      PT LONG TERM GOAL #4   Title Patient will report no limitation with walking or standing    Time 8    Period Weeks    Status New    Target Date  02/22/20                 Plan - 01/12/20 1356    Clinical Impression Statement Patient tolerated therapy well with no adverse effects. She demonstrates improved knee motion this visit and is progressing well with strengthening. Her HEP was progressed and and she was encouraged to continue walking, and using modalities as needed for pain control. She would benefit from continued skilled PT to progress motion and strength to improve walking ability and maximize functional level.    PT Treatment/Interventions ADLs/Self Care Home Management;Cryotherapy;Electrical Stimulation;Moist Heat;Neuromuscular re-education;Balance training;Therapeutic exercise;Therapeutic activities;Functional mobility training;Stair training;Gait training;Patient/family education;Manual techniques;Dry needling;Passive range of motion;Taping;Vasopneumatic Device;Joint Manipulations    PT Next Visit Plan Assess HEP and progress PRN, bike, manual and stretching for improving knee motion, progress strength as tolerated, gait and balance training, *cannot bill for vaso    PT Home Exercise Plan [id number]: heel prop, quad set, supine calf stretch with strap, supine heel slide with strap, seated hamstring stretch, seated knee AROM; SLR, hip abduction, hip extension, bridge, banded LAQ and standing hamstring curl, heel raises, sit<>stand    Consulted and Agree with Plan of Care Patient           Patient will benefit from skilled therapeutic intervention in order to improve the following deficits and impairments:  Abnormal gait, Decreased range of motion, Difficulty walking, Pain, Decreased activity tolerance, Decreased balance, Decreased strength, Improper body mechanics, Impaired flexibility  Visit Diagnosis: Chronic pain of right knee  Stiffness of right knee, not elsewhere classified  Muscle weakness (generalized)  Other abnormalities of gait and mobility  Localized edema     Problem List Patient Active Problem  List   Diagnosis Date Noted  . Status post total right knee replacement 12/12/2019  . Primary osteoarthritis of right knee 12/11/2019  . Epidermal cyst of neck 07/21/2019  . Localized swelling, mass and lump, neck 07/20/2019  . Allergic rhinitis 05/20/2019  . GERD (gastroesophageal reflux disease)   . COPD (chronic obstructive pulmonary disease) ([place])   . Chronic diastolic (congestive) heart failure ([place])   . Foreign body aspiration   . Breast cancer ([place]) 07/06/2018  . 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[place]) 04/08/2018  . Carpal tunnel syndrome 03/16/2018  . Obesity 08/24/2017  . Plantar fasciitis 06/06/2016  . Low back pain 05/21/2016  .  Health care maintenance 12/29/2015  . Osteoarthritis of knees, bilateral 05/31/2015  . Knee pain 01/04/2015  . Dysphagia 08/16/2014  . Weight gain 08/05/2014  . DOE (dyspnea on exertion) 04/28/2014  . Frequent falls 12/23/2013  . Bilateral shoulder pain 04/07/2013  . Anemia in chronic illness 08/11/2012  . Status post trachelectomy 08/04/2012  . Chronic pain 01/20/2012  . History of head and neck cancer 09/26/2011  . Hx of radiation therapy   . Tracheitis 07/17/2011  . Seizure ([place]) 07/17/2011  . Hypothyroidism 03/19/2011  . Essential hypertension 03/19/2011  . 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 [place], [place], [address] Phone: [phone]   Fax:  [phone]  Name: Deborah [name] MRN: [id number] Date of Birth: Feb 09, 1956

## 2020-01-16 ENCOUNTER — Other Ambulatory Visit: Payer: Self-pay

## 2020-01-16 ENCOUNTER — Ambulatory Visit: Payer: Medicaid Other | Attending: Physician Assistant | Admitting: Physical Therapy

## 2020-01-16 ENCOUNTER — Encounter: Payer: Self-pay | Admitting: Physical Therapy

## 2020-01-16 ENCOUNTER — Other Ambulatory Visit: Payer: Self-pay | Admitting: *Deleted

## 2020-01-16 DIAGNOSIS — R2689 Other abnormalities of gait and mobility: Secondary | ICD-10-CM | POA: Insufficient documentation

## 2020-01-16 DIAGNOSIS — M25661 Stiffness of right knee, not elsewhere classified: Secondary | ICD-10-CM | POA: Diagnosis not present

## 2020-01-16 DIAGNOSIS — G8929 Other chronic pain: Secondary | ICD-10-CM | POA: Diagnosis not present

## 2020-01-16 DIAGNOSIS — M25561 Pain in right knee: Secondary | ICD-10-CM | POA: Insufficient documentation

## 2020-01-16 DIAGNOSIS — R600 Localized edema: Secondary | ICD-10-CM

## 2020-01-16 DIAGNOSIS — M6281 Muscle weakness (generalized): Secondary | ICD-10-CM | POA: Insufficient documentation

## 2020-01-16 MED ORDER — SCOPOLAMINE 1 MG/3DAYS TD PT72: 1.0000 | MEDICATED_PATCH | Freq: Every day | TRANSDERMAL | 2 refills | Status: DC | PRN

## 2020-01-16 NOTE — Therapy (Signed)
[place], [place], [address] Phone: [phone]   Fax:  [phone]  Physical Therapy Treatment  Patient Details  Name: Deborah [name] MRN: [id number] Date of Birth: 05/02/1956 Referring Provider (PT): Aundra Dubin, [place]   Encounter Date: 01/16/2020   PT End of Session - 01/16/20 0827    Visit Number 5    Number of Visits 17    Date for PT Re-Evaluation 02/22/20    Authorization Type [employer] BLUE    Authorization Time Period 01/04/2020 - 02/29/2020    Authorization - Visit Number 4    Authorization - Number of Visits 16    PT Start Time 0830    PT Stop Time 0915    PT Time Calculation (min) 45 min    Activity Tolerance Patient tolerated treatment well    Behavior During Therapy [facility] for tasks assessed/performed           Past Medical History:  Diagnosis Date  . Anemia    h/o of  . Arthritis    in knees  . Asthma   . Breast cancer ([place])   . Bronchitis   . COPD (chronic obstructive pulmonary disease) ([place])   . Diverticulosis 11/15/2012    noted on screening colonoscopy   . Dyspnea    with COPD exerbation  . Esophageal stricture   . Former smoker 03/19/2011  . GERD (gastroesophageal reflux disease)   . Heart murmur    asymptomatic   . History of laryngectomy   . History of radiation therapy 11/15/18- 12/06/18   Right Breast total dose 42.56 Gy in 16 fractions.   [name] Hx of radiation therapy 09/03/10 to 10/16/2010   supraglottic larynx  . Hypertension   . 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 Internal hemorrhoid 11/15/2012    small, noted on screening colonoscopy   . 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  . Leukocytopenia   . Nausea alone 07/28/2013  . Neck pain 01/21/2012  . Normal MRI 07/14/11   negative for mestasis   . Pneumonia 2012  . Sciatica   . Seizures ([place])    07/24/11 off Effexor w/o seizure  . Sepsis ([place]) 08/04/12  .  Sinusitis, chronic 07/20/2011   Bilateral maxillary, identified on MRI of head 07/14/11.    . Tracheostomy dependent [facility])     Past Surgical History:  Procedure Laterality Date  . BREAST LUMPECTOMY Right 07/06/2018  . BREAST LUMPECTOMY WITH RADIOACTIVE SEED LOCALIZATION Right 07/06/2018   Procedure: RIGHT BREAST LUMPECTOMY WITH RADIOACTIVE SEED LOCALIZATION;  Surgeon: Rolm Bookbinder, MD;  Location: [place];  Service: General;  Laterality: Right;  . COLONOSCOPY N/A 11/15/2012   Procedure: COLONOSCOPY;  Surgeon: Lafayette Dragon, MD;  Location: [facility];  Service: Endoscopy;  Laterality: N/A;  . DENTAL RESTORATION/EXTRACTION WITH X-RAY    . ESOPHAGEAL DILATION N/A 09/09/2019   Procedure: ESOPHAGEAL DILATION;  Surgeon: [name], MD;  Location: [place];  Service: ENT;  Laterality: N/A;  . ESOPHAGEAL DILATION N/A 10/05/2019   Procedure: ESOPHAGEAL DILATION;  Surgeon: [name], MD;  Location: [place];  Service: ENT;  Laterality: N/A;  via tracheostomy  . ESOPHAGOGASTRODUODENOSCOPY (EGD) WITH PROPOFOL N/A 08/12/2018   Procedure: ESOPHAGOGASTRODUODENOSCOPY (EGD) WITH PROPOFOL;  Surgeon: Doran Stabler, MD;  Location: [place];  Service: Gastroenterology;  Laterality: N/A;  . ESOPHAGOSCOPY  06/21/2012   Procedure: ESOPHAGOSCOPY;  Surgeon: [name],  MD;  Location: [place];  Service: ENT;  Laterality: N/A;  . ESOPHAGOSCOPY WITH DILITATION N/A 09/21/2014   Procedure: ESOPHAGOSCOPY WITH DILITATION;  Surgeon: [name], MD;  Location: [place];  Service: ENT;  Laterality: N/A;  . ESOPHAGOSCOPY WITH DILITATION N/A 07/04/2016   Procedure: ESOPHAGOSCOPY WITH DILITATION;  Surgeon: [name], MD;  Location: [place];  Service: ENT;  Laterality: N/A;  . ESOPHAGOSCOPY WITH DILITATION N/A 12/01/2017   Procedure: ESOPHAGOSCOPY WITH DILITATION;  Surgeon: [name], MD;  Location: [place];  Service: ENT;  Laterality: N/A;  . ESOPHAGOSCOPY WITH DILITATION  N/A 04/19/2018   Procedure: ESOPHAGOSCOPY WITH DILITATION;  Surgeon: [name], MD;  Location: [place];  Service: ENT;  Laterality: N/A;  . ESOPHAGOSCOPY WITH DILITATION N/A 03/07/2019   Procedure: Esophagoscopy with dilatation;  Surgeon: [name], MD;  Location: [place];  Service: ENT;  Laterality: N/A;  . FLEXIBLE BRONCHOSCOPY  01/08/2018      . FOREIGN BODY REMOVAL BRONCHIAL  10/02/2011   Procedure: REMOVAL FOREIGN BODY BRONCHIAL;  Surgeon: Ruby Cola, MD;  Location: [place];  Service: ENT;  Laterality: N/A;  . FOREIGN BODY REMOVAL BRONCHIAL N/A 01/08/2018   Procedure: REMOVAL FOREIGN BODY BRONCHIAL;  Surgeon: Jodi Marble, MD;  Location: [facility];  Service: ENT;  Laterality: N/A;  . LARYNGECTOMY    . Porta cath removal    . PORTACATH PLACEMENT  09/17/10   Tip in cavoatrial junction  . RE-EXCISION OF BREAST CANCER,SUPERIOR MARGINS Right 07/29/2018   Procedure: RE-EXCISION OF RIGHT BREAST MEDIAL MARGINS;  Surgeon: Rolm Bookbinder, MD;  Location: [place];  Service: General;  Laterality: Right;  . RIGID ESOPHAGOSCOPY N/A 03/19/2019   Procedure: FLEXIBLE ESOPHAGOSCOPY;  Surgeon: Jodi Marble, MD;  Location: [place];  Service: ENT;  Laterality: N/A;  . STOMAPLASTY N/A 10/21/2016   Procedure: Zola Button;  Surgeon: [name], MD;  Location: [place];  Service: ENT;  Laterality: N/A;  . TOTAL KNEE ARTHROPLASTY Right 12/12/2019   Procedure: RIGHT TOTAL KNEE ARTHROPLASTY;  Surgeon: Leandrew Koyanagi, MD;  Location: [place];  Service: Orthopedics;  Laterality: Right;  . TRACHEAL DILITATION  07/16/2011   Procedure: TRACHEAL DILITATION;  Surgeon: Beckie Salts, MD;  Location: [place];  Service: ENT;  Laterality: N/A;  dilation of tracheal stoma and replacement of stoma tube  . TUBAL LIGATION  1982    There were no vitals filed for this visit.   Subjective Assessment - 01/16/20 0826    Subjective Patient reports she is doing well. New exercises are doing good at home. Her knee continues to  improve.    Patient Stated Goals Get rid of the pain and back to walking    Currently in Pain? Yes    Pain Score 4     Pain Location Knee    Pain Orientation Right    Pain Descriptors / Indicators Aching;Tightness;Sore    Pain Type Surgical pain    Pain Onset More than a month ago    Pain Frequency Constant              [facility] PT Assessment - 01/16/20 0001      Assessment   Medical Diagnosis Right TKA    Referring Provider (PT) Aundra Dubin, PA-C    Onset Date/Surgical Date 12/12/19      Observation/Other Assessments   Focus on Therapeutic Outcomes (FOTO)  NA - MCD      AROM   Right Knee Flexion 122  [facility] Adult PT Treatment/Exercise - 01/16/20 0001      Neuro Re-ed    Neuro Re-ed Details  SLS 4x15 sec      Exercises   Exercises Knee/Hip      Knee/Hip Exercises: Stretches   Passive Hamstring Stretch 2 reps;20 seconds    Quad Stretch --   with hip flexor stretch   Hip Flexor Stretch 2 reps;30 seconds    Hip Flexor Stretch Limitations supine edge of mat    Gastroc Stretch 2 reps;30 seconds    Gastroc Stretch Limitations slant board      Knee/Hip Exercises: Aerobic   Nustep L5 x 5 min (UE and LE)      Knee/Hip Exercises: Machines for Strengthening   Cybex Knee Extension 25# 2x10    Cybex Knee Flexion 25# 2x10    Cybex Leg Press 55# 2x10      Knee/Hip Exercises: Standing   Lateral Step Up 2 sets;10 reps    Lateral Step Up Limitations 6" step-height    Forward Step Up 2 sets;10 reps    Forward Step Up Limitations 8" step-height      Manual Therapy   Manual Therapy Joint mobilization;Passive ROM    Joint Mobilization PF and knee A/P mobs supine and seated for knee flexion and extension ROM, followed by PROM     Passive ROM Knee flexion and extension to tolerance                  PT Education - 01/16/20 0826    Education Details HEP, balance    Person(s) Educated Patient    Methods  Explanation;Demonstration;Verbal cues;Handout    Comprehension Verbalized understanding;Returned demonstration;Verbal cues required;Need further instruction            PT Short Term Goals - 01/16/20 5859      PT SHORT TERM GOAL #1   Title Patient will be I with initial HEP to progress with PT    Baseline Patient continues to require cueing for technique - 01/16/2020    Time 3    Period Weeks    Status On-going    Target Date 01/18/20      PT SHORT TERM GOAL #2   Title Patient will exhibit improved AROM knee flexion to >/= 110 deg and knee extension to </= 5 deg lacking from neutral to improve gait and transfers    Baseline Flexion: 120, Extension: 4 lacking - 01/12/2020    Time 3    Period Weeks    Status Achieved    Target Date 01/18/20      PT SHORT TERM GOAL #3   Title Patient will report </= 6/10 pain level with activity to improve functional ability    Baseline 4/10 pain level - 01/16/2020    Time 3    Period Weeks    Status Achieved    Target Date 01/18/20             PT Long Term Goals - 12/28/19 1334      PT LONG TERM GOAL #1   Title Patient will be I with final HEP to maintain progress from PT    Time 8    Period Weeks    Status New    Target Date 02/22/20      PT LONG TERM GOAL #2   Title Patient will demonstrated right knee AROM WFL to all for normalalized transfers and gait    Time 8    Period Weeks  Status New    Target Date 02/22/20      PT LONG TERM GOAL #3   Title Patient will exhibit right knee strength grossly 5/5 to improve stair negotiation and activity tolerance    Time 8    Period Weeks    Status New    Target Date 02/22/20      PT LONG TERM GOAL #4   Title Patient will report no limitation with walking or standing    Time 8    Period Weeks    Status New    Target Date 02/22/20                 Plan - 01/16/20 0827    Clinical Impression Statement Patient tolerated therapy well with no adverse effects. Range of motion  continues to gradually improve. Progressed strengthening this visit with good tolerance. Patient exhibits good control with step-ups and continues to improve walking ability. She does exhibit balance deficit bilaterally, balance training added to HEP. She would benefit from continued skilled PT to progress motion and strength to improve walking ability and maximize functional level.    PT Treatment/Interventions ADLs/Self Care Home Management;Cryotherapy;Electrical Stimulation;Moist Heat;Neuromuscular re-education;Balance training;Therapeutic exercise;Therapeutic activities;Functional mobility training;Stair training;Gait training;Patient/family education;Manual techniques;Dry needling;Passive range of motion;Taping;Vasopneumatic Device;Joint Manipulations    PT Next Visit Plan Assess HEP and progress PRN, bike, manual and stretching for improving knee motion, progress strength as tolerated, gait and balance training, *cannot bill for vaso    PT Home Exercise Plan [id number]: heel prop, quad set, standing calf stretch with strap, supine heel slide with strap, seated hamstring stretch, seated knee AROM; SLR, hip abduction, hip extension, bridge, banded LAQ and standing hamstring curl, heel raises, sit<>stand, SLS    Consulted and Agree with Plan of Care Patient           Patient will benefit from skilled therapeutic intervention in order to improve the following deficits and impairments:  Abnormal gait, Decreased range of motion, Difficulty walking, Pain, Decreased activity tolerance, Decreased balance, Decreased strength, Improper body mechanics, Impaired flexibility  Visit Diagnosis: Chronic pain of right knee  Stiffness of right knee, not elsewhere classified  Muscle weakness (generalized)  Other abnormalities of gait and mobility  Localized edema     Problem List Patient Active Problem List   Diagnosis Date Noted  . Status post total right knee replacement 12/12/2019  . Primary  osteoarthritis of right knee 12/11/2019  . Epidermal cyst of neck 07/21/2019  . Localized swelling, mass and lump, neck 07/20/2019  . Allergic rhinitis 05/20/2019  . GERD (gastroesophageal reflux disease)   . COPD (chronic obstructive pulmonary disease) ([place])   . Chronic diastolic (congestive) heart failure ([place])   . Foreign body aspiration   . Breast cancer ([place]) 07/06/2018  . 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[place]) 04/08/2018  . Carpal tunnel syndrome 03/16/2018  . Obesity 08/24/2017  . Plantar fasciitis 06/06/2016  . Low back pain 05/21/2016  . Health care maintenance 12/29/2015  . Osteoarthritis of knees, bilateral 05/31/2015  . Knee pain 01/04/2015  . Dysphagia 08/16/2014  . Weight gain 08/05/2014  . DOE (dyspnea on exertion) 04/28/2014  . Frequent falls 12/23/2013  . Bilateral shoulder pain 04/07/2013  . Anemia in chronic illness 08/11/2012  . Status post trachelectomy 08/04/2012  . Chronic pain 01/20/2012  . History of head and neck cancer 09/26/2011  . Hx of radiation therapy   . Tracheitis 07/17/2011  .  Seizure ([place]) 07/17/2011  . Hypothyroidism 03/19/2011  . Essential hypertension 03/19/2011  . 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 [address] [place], [place], [address] Phone: [phone]   Fax:  [phone]  Name: Mildreth Reek MRN: [id number] Date of Birth: 14-Feb-1956

## 2020-01-17 ENCOUNTER — Ambulatory Visit (HOSPITAL_COMMUNITY): Payer: Self-pay | Admitting: Dentistry

## 2020-01-17 ENCOUNTER — Encounter (HOSPITAL_COMMUNITY): Payer: Self-pay | Admitting: Dentistry

## 2020-01-17 VITALS — BP 160/88 | HR 80 | Temp 99.9°F

## 2020-01-17 DIAGNOSIS — Z463 Encounter for fitting and adjustment of dental prosthetic device: Secondary | ICD-10-CM

## 2020-01-17 DIAGNOSIS — C321 Malignant neoplasm of supraglottis: Secondary | ICD-10-CM

## 2020-01-17 DIAGNOSIS — Z930 Tracheostomy status: Secondary | ICD-10-CM

## 2020-01-17 DIAGNOSIS — Z9221 Personal history of antineoplastic chemotherapy: Secondary | ICD-10-CM | POA: Diagnosis not present

## 2020-01-17 DIAGNOSIS — K08109 Complete loss of teeth, unspecified cause, unspecified class: Secondary | ICD-10-CM

## 2020-01-17 DIAGNOSIS — K0820 Unspecified atrophy of edentulous alveolar ridge: Secondary | ICD-10-CM

## 2020-01-17 DIAGNOSIS — Z923 Personal history of irradiation: Secondary | ICD-10-CM

## 2020-01-17 NOTE — Progress Notes (Signed)
01/17/2020  Patient:            Deborah [name] Date of Birth:  08/01/1955 MRN:                [id number]  BP (!) 160/88 (BP Location: Right Arm)   Pulse 80   Temp 99.9 F (37.7 C)   COVID 19 SCREENING: The patient does not symptoms concerning for COVID-19 infection (Including fever, chills, cough, or new SHORTNESS OF BREATH).  Patient has had both doses of the Pfizer Covid vaccine.  Deborah [name] is a 64 year old female that presents for periodic oral exam and evaluation of upper and lower complete dentures. Patient was last seen on 01/17/2019 for denture recall.  Patient denies having any problems with her upper and lower complete dentures.  Patient reports right total knee replacement in June 2021.    Medical Hx Update:  Past Medical History:  Diagnosis Date  . Anemia    h/o of  . Arthritis    in knees  . Asthma   . Breast cancer ([place])   . Bronchitis   . COPD (chronic obstructive pulmonary disease) ([place])   . Diverticulosis 11/15/2012    noted on screening colonoscopy   . Dyspnea    with COPD exerbation  . Esophageal stricture   . Former smoker 03/19/2011  . GERD (gastroesophageal reflux disease)   . Heart murmur    asymptomatic   . History of laryngectomy   . History of radiation therapy 11/15/18- 12/06/18   Right Breast total dose 42.56 Gy in 16 fractions.   [name] Hx of radiation therapy 09/03/10 to 10/16/2010   supraglottic larynx  . Hypertension   . 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 Internal hemorrhoid 11/15/2012    small, noted on screening colonoscopy   . 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  . Leukocytopenia   . Nausea alone 07/28/2013  . Neck pain 01/21/2012  . Normal MRI 07/14/11   negative for mestasis   . Pneumonia 2012  . Sciatica   . Seizures ([place])    07/24/11 off Effexor w/o seizure  . Sepsis ([place]) 08/04/12  . Sinusitis, chronic 07/20/2011   Bilateral maxillary, identified on MRI of head 07/14/11.    .  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[place])   . ALLERGIES/ADVERSE DRUG REACTIONS: Allergies  Allergen Reactions  . Lisinopril Cough   MEDICATIONS: Current Outpatient Medications  Medication Sig Dispense Refill  . albuterol (PROVENTIL) (2.5 MG/3ML) 0.083% nebulizer solution Take 3 mLs (2.5 mg total) by nebulization every 6 (six) hours as needed for wheezing or shortness of breath. 150 mL 1  . amLODipine (NORVASC) 10 MG tablet Take 1 tablet (10 mg total) by mouth daily. 90 tablet 3  . anastrozole (ARIMIDEX) 1 MG tablet Take 1 tablet (1 mg total) by mouth daily. 90 tablet 3  . aspirin EC 81 MG tablet Take 1 tablet (81 mg total) by mouth 2 (two) times daily. 84 tablet 0  . benzonatate (TESSALON) 100 MG capsule TAKE 1 CAPSULE BY MOUTH TWICE DAILY AS NEEDED FOR COUGH 20 capsule 0  . buPROPion (WELLBUTRIN XL) 150 MG 24 hr tablet Take 1 tablet (150 mg total) by mouth daily. 90 tablet 3  . cetirizine (ZYRTEC) 10 MG tablet Take 1 tablet by mouth once daily 30 tablet 0  . DULERA 200-5 MCG/ACT AERO Inhale 2 puffs by mouth twice daily 13 g 0  . EUTHYROX 175 MCG tablet TAKE  1 TABLET BY MOUTH ONCE DAILY BEFORE BREAKFAST 30 tablet 0  . losartan (COZAAR) 100 MG tablet Take 1 tablet (100 mg total) by mouth daily. 30 tablet 3  . meloxicam (MOBIC) 15 MG tablet TAKE 1 TABLET BY MOUTH ONCE DAILY AS NEEDED 30 tablet 0  . methocarbamol (ROBAXIN) 750 MG tablet Take 1 tablet (750 mg total) by mouth 2 (two) times daily as needed for muscle spasms. 20 tablet 3  . metoprolol tartrate (LOPRESSOR) 50 MG tablet Take 1 tablet (50 mg total) by mouth 2 (two) times daily. 90 tablet 2  . nystatin cream (MYCOSTATIN) APPLY  CREAM TOPICALLY TWICE DAILY AS NEEDED (Patient taking differently: Apply 1 application topically daily as needed for dry skin. ) 30 g 0  . omeprazole (PRILOSEC) 40 MG capsule Take 1 capsule by mouth twice daily 90 capsule 0  . ondansetron (ZOFRAN) 4 MG tablet TAKE 1 TO 2 TABLETS BY MOUTH EVERY 8 HOURS AS NEEDED FOR NAUSEA OR  VOMITING 20 tablet 0  . oxyCODONE-acetaminophen (PERCOCET) 5-325 MG tablet Take 1-2 tablets by mouth every 8 (eight) hours as needed for severe pain. 30 tablet 0  . PROVENTIL HFA 108 (90 Base) MCG/ACT inhaler INHALE 2 PUFFS BY MOUTH EVERY 4 HOURS AS NEEDED OR SHORTNESS OF BREATH (Patient taking differently: Inhale 2 puffs into the lungs every 4 (four) hours as needed for wheezing or shortness of breath. ) 14 g 2  . scopolamine (TRANSDERM-SCOP, 1.5 MG,) 1 MG/3DAYS Place 1 patch (1.5 mg total) onto the skin daily as needed (Nausea). 10 patch 2  . senna-docusate (SENOKOT S) 8.6-50 MG tablet Take 1-2 tablets by mouth at bedtime as needed. 30 tablet 1  . tiotropium (SPIRIVA) 18 MCG inhalation capsule Place 18 mcg into inhaler and inhale daily as needed (shortness of breath).      No current facility-administered medications for this visit.    C/C: Patient presents for periodic oral examination and evaluation of upper and lower complete dentures.   HPI:   Patient had upper and  lower complete dentures inserted on 02/27/2011. Patient had reline procedures done on November 30, 2012. The patient was last seen 01/17/2019 for denture recall.  The patient now presents for periodic oral examination and evaluation of the upper and lower complete dentures. Patient is not having any problems with her upper or lower complete dentures by report.  DENTAL EXAM: General: Patient is a well-developed, well-nourished female in no acute distress. Tracheostomy is present. Vitals: BP (!) 160/88 (BP Location: Right Arm)   Pulse 80   Temp 99.9 F (37.7 C)  Extraoral Exam:  There is no palpable lymphadenopathy. There are no TMJ Symptoms. The tracheal stoma is still in place.  Intraoral  Exam: Patient has xerostomia. There are no soft tissue lesions or denture irritation noted.  There is atrophy of the edentulous alveolar ridges. Patient has thin, spiny ridges of the mandibular arch. Dentition: Patient is  edentulous. Prosthodontic: The patient has upper and lower complete dentures. Dentures are stable and retentive.  Pressure indicating paste was applied to the dentures and dentures were adjusted as needed. Dentures were polished.  Patient accepted the results of the denture adjustment. Occlusion:  The denture occlusion was evaluated. The patient has acceptable occlusion. The patient has a tendency to protrude to and past the End to End position. Patient indicates she is able to chew "anything she wants"  Assessments: 1. Patient is edentulous. 2. There is atrophy of the edentulous alveolar ridges 3. Upper and  lower complete dentures are acceptable.   Plan:  1. Patient to followup with the new Dentist of her choice (that accepts [employer]) for a denture recall in one year sine I will be retiring soon.  Patient will be eligible for a new upper and lower complete denture thru Medicaid in September 2022.   (10 year limitation) The patient was given information on Dr. Perlie Gold with [facility] for follow-up evaluation for new upper and lower complete dentures. 2. The patient to keep dentures out if sore spots arise. Use salt water rinses as needed aid healing.   Lenn Cal, DDS

## 2020-01-17 NOTE — Patient Instructions (Addendum)
Plan:  1. Patient to followup with the new Dentist of her choice (that accepts [employer]) for a denture recall in one year sine I will be retiring soon.  Patient will be eligible for a new upper and lower complete denture thru Medicaid in September 2022.   (10 year limitation) The patient was given information on Dr. Perlie Gold with [facility] for follow-up evaluation for new upper and lower complete dentures. 2. The patient to keep dentures out if sore spots arise. Use salt water rinses as needed aid healing.   Lenn Cal, DDS

## 2020-01-18 ENCOUNTER — Ambulatory Visit: Payer: Medicaid Other | Admitting: Physical Therapy

## 2020-01-18 ENCOUNTER — Encounter: Payer: Self-pay | Admitting: Physical Therapy

## 2020-01-18 ENCOUNTER — Other Ambulatory Visit: Payer: Self-pay

## 2020-01-18 DIAGNOSIS — R2689 Other abnormalities of gait and mobility: Secondary | ICD-10-CM | POA: Diagnosis not present

## 2020-01-18 DIAGNOSIS — G8929 Other chronic pain: Secondary | ICD-10-CM

## 2020-01-18 DIAGNOSIS — M25561 Pain in right knee: Secondary | ICD-10-CM | POA: Diagnosis not present

## 2020-01-18 DIAGNOSIS — R600 Localized edema: Secondary | ICD-10-CM

## 2020-01-18 DIAGNOSIS — M25661 Stiffness of right knee, not elsewhere classified: Secondary | ICD-10-CM | POA: Diagnosis not present

## 2020-01-18 DIAGNOSIS — M6281 Muscle weakness (generalized): Secondary | ICD-10-CM | POA: Diagnosis not present

## 2020-01-18 NOTE — Therapy (Signed)
[facility] [place], [place], [address] Phone: [phone]   Fax:  [phone]  Physical Therapy Treatment  Patient Details  Name: Deborah [name] MRN: [id number] Date of Birth: 24-Jan-1956 Referring Provider (PT): Aundra Dubin, [place]   Encounter Date: 01/18/2020   PT End of Session - 01/18/20 1005    Visit Number 6    Number of Visits 17    Date for PT Re-Evaluation 02/22/20    Authorization Type [employer]    Authorization Time Period 01/04/2020 - 02/29/2020    Authorization - Visit Number 5    Authorization - Number of Visits 16    PT Start Time 1000    PT Stop Time 1043    PT Time Calculation (min) 43 min    Activity Tolerance Patient tolerated treatment well    Behavior During Therapy [facility] for tasks assessed/performed           Past Medical History:  Diagnosis Date   Anemia    h/o of   Arthritis    in knees   Asthma    Breast cancer ([place])    Bronchitis    COPD (chronic obstructive pulmonary disease) ([place])    Diverticulosis 11/15/2012    noted on screening colonoscopy    Dyspnea    with COPD exerbation   Esophageal stricture    Former smoker 03/19/2011   GERD (gastroesophageal reflux disease)    Heart murmur    asymptomatic    History of laryngectomy    History of radiation therapy 11/15/18- 12/06/18   Right Breast total dose 42.56 Gy in 16 fractions.    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   Leukocytopenia    Nausea alone 07/28/2013   Neck pain 01/21/2012   Normal MRI 07/14/11   negative for mestasis    Pneumonia 2012   Sciatica    Seizures ([place])    07/24/11 off Effexor w/o seizure   Sepsis ([place]) 08/04/12    Sinusitis, chronic 07/20/2011   Bilateral maxillary, identified on MRI of head 07/14/11.     Tracheostomy dependent [facility])     Past Surgical History:  Procedure Laterality Date   BREAST LUMPECTOMY Right 07/06/2018   BREAST LUMPECTOMY WITH RADIOACTIVE SEED LOCALIZATION Right 07/06/2018   Procedure: RIGHT BREAST LUMPECTOMY WITH RADIOACTIVE SEED LOCALIZATION;  Surgeon: Rolm Bookbinder, MD;  Location: [place];  Service: General;  Laterality: Right;   COLONOSCOPY N/A 11/15/2012   Procedure: COLONOSCOPY;  Surgeon: Lafayette Dragon, MD;  Location: [facility];  Service: Endoscopy;  Laterality: N/A;   DENTAL RESTORATION/EXTRACTION WITH X-RAY     ESOPHAGEAL DILATION N/A 09/09/2019   Procedure: ESOPHAGEAL DILATION;  Surgeon: [name], MD;  Location: [place];  Service: ENT;  Laterality: N/A;   ESOPHAGEAL DILATION N/A 10/05/2019   Procedure: ESOPHAGEAL DILATION;  Surgeon: [name], MD;  Location: [place];  Service: ENT;  Laterality: N/A;  via tracheostomy   ESOPHAGOGASTRODUODENOSCOPY (EGD) WITH PROPOFOL N/A 08/12/2018   Procedure: ESOPHAGOGASTRODUODENOSCOPY (EGD) WITH PROPOFOL;  Surgeon: Doran Stabler, MD;  Location: [place];  Service: Gastroenterology;  Laterality: N/A;   ESOPHAGOSCOPY  06/21/2012   Procedure: ESOPHAGOSCOPY;  Surgeon: [name],  MD;  Location: [place];  Service: ENT;  Laterality: N/A;   ESOPHAGOSCOPY WITH DILITATION N/A 09/21/2014   Procedure: ESOPHAGOSCOPY WITH DILITATION;  Surgeon: [name], MD;  Location: [place];  Service: ENT;  Laterality: N/A;   ESOPHAGOSCOPY WITH DILITATION N/A 07/04/2016   Procedure: ESOPHAGOSCOPY WITH DILITATION;  Surgeon: [name], MD;  Location: [place];  Service: ENT;  Laterality: N/A;   ESOPHAGOSCOPY WITH DILITATION N/A 12/01/2017   Procedure: ESOPHAGOSCOPY WITH DILITATION;  Surgeon: [name], MD;  Location: [place];  Service: ENT;  Laterality: N/A;   ESOPHAGOSCOPY WITH DILITATION  N/A 04/19/2018   Procedure: ESOPHAGOSCOPY WITH DILITATION;  Surgeon: [name], MD;  Location: [place];  Service: ENT;  Laterality: N/A;   ESOPHAGOSCOPY WITH DILITATION N/A 03/07/2019   Procedure: Esophagoscopy with dilatation;  Surgeon: [name], MD;  Location: [place];  Service: ENT;  Laterality: N/A;   FLEXIBLE BRONCHOSCOPY  01/08/2018       FOREIGN BODY REMOVAL BRONCHIAL  10/02/2011   Procedure: REMOVAL FOREIGN BODY BRONCHIAL;  Surgeon: Ruby Cola, MD;  Location: [place];  Service: ENT;  Laterality: N/A;   FOREIGN BODY REMOVAL BRONCHIAL N/A 01/08/2018   Procedure: REMOVAL FOREIGN BODY BRONCHIAL;  Surgeon: Jodi Marble, MD;  Location: [facility];  Service: ENT;  Laterality: N/A;   LARYNGECTOMY     Porta cath removal     PORTACATH PLACEMENT  09/17/10   Tip in cavoatrial junction   RE-EXCISION OF BREAST CANCER,SUPERIOR MARGINS Right 07/29/2018   Procedure: RE-EXCISION OF RIGHT BREAST MEDIAL MARGINS;  Surgeon: Rolm Bookbinder, MD;  Location: [place];  Service: General;  Laterality: Right;   RIGID ESOPHAGOSCOPY N/A 03/19/2019   Procedure: FLEXIBLE ESOPHAGOSCOPY;  Surgeon: Jodi Marble, MD;  Location: [place];  Service: ENT;  Laterality: N/A;   STOMAPLASTY N/A 10/21/2016   Procedure: Zola Button;  Surgeon: [name], MD;  Location: [place];  Service: ENT;  Laterality: N/A;   TOTAL KNEE ARTHROPLASTY Right 12/12/2019   Procedure: RIGHT TOTAL KNEE ARTHROPLASTY;  Surgeon: Leandrew Koyanagi, MD;  Location: [place];  Service: Orthopedics;  Laterality: Right;   TRACHEAL DILITATION  07/16/2011   Procedure: TRACHEAL DILITATION;  Surgeon: Beckie Salts, MD;  Location: [place];  Service: ENT;  Laterality: N/A;  dilation of tracheal stoma and replacement of stoma tube   TUBAL LIGATION  1982    There were no vitals filed for this visit.   Subjective Assessment - 01/18/20 1003    Subjective Patient reports her knee is a little more sore today. She continues to have increased pain at  night.    Patient Stated Goals Get rid of the pain and back to walking    Currently in Pain? Yes    Pain Location Knee    Pain Orientation Right    Pain Descriptors / Indicators Aching;Tightness;Sore    Pain Type Surgical pain    Pain Onset More than a month ago    Pain Frequency Constant                             [facility] Adult PT Treatment/Exercise - 01/18/20 0001      Neuro Re-ed    Neuro Re-ed Details  Tandem stance 2x30 sec, SLS 4x15 sec      Exercises   Exercises Knee/Hip      Knee/Hip Exercises: Stretches   Passive Hamstring Stretch 2 reps;20 seconds    Hip Flexor Stretch  2 reps;30 seconds    Hip Flexor Stretch Limitations supine edge of mat    Gastroc Stretch 2 reps;30 seconds    Gastroc Stretch Limitations slant board      Knee/Hip Exercises: Aerobic   Recumbent Bike L2 x 5 min      Knee/Hip Exercises: Machines for Strengthening   Cybex Leg Press 65# 2x10      Knee/Hip Exercises: Standing   Heel Raises 2 sets;20 reps    Hip Abduction 2 sets;15 reps    Abduction Limitations yellow band    Forward Step Up 2 sets;15 reps    Forward Step Up Limitations 6" step height      Manual Therapy   Manual Therapy Joint mobilization;Passive ROM;Soft tissue mobilization    Joint Mobilization PF and knee A/P mobs supine and seated for knee flexion and extension ROM, followed by PROM     Soft tissue mobilization Quad with FR    Passive ROM Knee flexion and extension to tolerance                  PT Education - 01/18/20 1004    Education Details HEP    Person(s) Educated Patient    Methods Explanation;Demonstration;Verbal cues    Comprehension Verbalized understanding;Returned demonstration;Verbal cues required;Need further instruction            PT Short Term Goals - 01/16/20 0833      PT SHORT TERM GOAL #1   Title Patient will be I with initial HEP to progress with PT    Baseline Patient continues to require cueing for technique -  01/16/2020    Time 3    Period Weeks    Status On-going    Target Date 01/18/20      PT SHORT TERM GOAL #2   Title Patient will exhibit improved AROM knee flexion to >/= 110 deg and knee extension to </= 5 deg lacking from neutral to improve gait and transfers    Baseline Flexion: 120, Extension: 4 lacking - 01/12/2020    Time 3    Period Weeks    Status Achieved    Target Date 01/18/20      PT SHORT TERM GOAL #3   Title Patient will report </= 6/10 pain level with activity to improve functional ability    Baseline 4/10 pain level - 01/16/2020    Time 3    Period Weeks    Status Achieved    Target Date 01/18/20             PT Long Term Goals - 12/28/19 1334      PT LONG TERM GOAL #1   Title Patient will be I with final HEP to maintain progress from PT    Time 8    Period Weeks    Status New    Target Date 02/22/20      PT LONG TERM GOAL #2   Title Patient will demonstrated right knee AROM WFL to all for normalalized transfers and gait    Time 8    Period Weeks    Status New    Target Date 02/22/20      PT LONG TERM GOAL #3   Title Patient will exhibit right knee strength grossly 5/5 to improve stair negotiation and activity tolerance    Time 8    Period Weeks    Status New    Target Date 02/22/20      PT LONG TERM GOAL #4  Title Patient will report no limitation with walking or standing    Time 8    Period Weeks    Status New    Target Date 02/22/20                 Plan - 01/18/20 1005    Clinical Impression Statement Patient tolerated therapy well with no adverse effects. Continued to progress mobility and strength but did focus more on balance this visit as patient continues to exhibit difficulty with SLS. She did report increased sorenss this visit so did not perform as much quad strengthening this visit. She would benefit from continued skilled PT to progress motion and strength to improve walking ability and maximize functional level.    PT  Treatment/Interventions ADLs/Self Care Home Management;Cryotherapy;Electrical Stimulation;Moist Heat;Neuromuscular re-education;Balance training;Therapeutic exercise;Therapeutic activities;Functional mobility training;Stair training;Gait training;Patient/family education;Manual techniques;Dry needling;Passive range of motion;Taping;Vasopneumatic Device;Joint Manipulations    PT Next Visit Plan Assess HEP and progress PRN, bike, manual and stretching for improving knee motion, progress strength as tolerated, gait and balance training, *cannot bill for vaso    PT Home Exercise Plan [id number]: heel prop, quad set, standing calf stretch with strap, supine heel slide with strap, seated hamstring stretch, seated knee AROM; SLR, hip abduction, hip extension, bridge, banded LAQ and standing hamstring curl, heel raises, sit<>stand, SLS    Consulted and Agree with Plan of Care Patient           Patient will benefit from skilled therapeutic intervention in order to improve the following deficits and impairments:  Abnormal gait, Decreased range of motion, Difficulty walking, Pain, Decreased activity tolerance, Decreased balance, Decreased strength, Improper body mechanics, Impaired flexibility  Visit Diagnosis: Chronic pain of right knee  Stiffness of right knee, not elsewhere classified  Muscle weakness (generalized)  Other abnormalities of gait and mobility  Localized edema     Problem List Patient Active Problem List   Diagnosis Date Noted   Status post total right knee replacement 12/12/2019   Primary osteoarthritis of right knee 12/11/2019   Epidermal cyst of neck 07/21/2019   Localized swelling, mass and lump, neck 07/20/2019   Allergic rhinitis 05/20/2019   GERD (gastroesophageal reflux disease)    COPD (chronic obstructive pulmonary disease) ([facility])    Chronic diastolic (congestive) heart failure ([facility])    Foreign body aspiration    Breast cancer ([place]) 07/06/2018   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[place]) 04/08/2018   Carpal tunnel syndrome 03/16/2018   Obesity 08/24/2017   Plantar fasciitis 06/06/2016   Low back pain 05/21/2016   Health care maintenance 12/29/2015   Osteoarthritis of knees, bilateral 05/31/2015   Knee pain 01/04/2015   Dysphagia 08/16/2014   Weight gain 08/05/2014   DOE (dyspnea on exertion) 04/28/2014   Frequent falls 12/23/2013   Bilateral shoulder pain 04/07/2013   Anemia in chronic illness 08/11/2012   Status post trachelectomy 08/04/2012   Chronic pain 01/20/2012   History of head and neck cancer 09/26/2011   Hx of radiation therapy    Tracheitis 07/17/2011   Seizure ([place]) 07/17/2011   Hypothyroidism 03/19/2011   Essential hypertension 03/19/2011   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 [place], [place], [address] Phone: [phone]   Fax:  [phone]  Name: Lundyn Coste MRN: [id number] Date  of Birth: September 28, 1955

## 2020-01-23 ENCOUNTER — Encounter: Payer: Self-pay | Admitting: Physical Therapy

## 2020-01-23 ENCOUNTER — Ambulatory Visit: Payer: Medicaid Other | Admitting: Physical Therapy

## 2020-01-23 ENCOUNTER — Other Ambulatory Visit: Payer: Self-pay

## 2020-01-23 DIAGNOSIS — R2689 Other abnormalities of gait and mobility: Secondary | ICD-10-CM | POA: Diagnosis not present

## 2020-01-23 DIAGNOSIS — M25661 Stiffness of right knee, not elsewhere classified: Secondary | ICD-10-CM | POA: Diagnosis not present

## 2020-01-23 DIAGNOSIS — R600 Localized edema: Secondary | ICD-10-CM

## 2020-01-23 DIAGNOSIS — M6281 Muscle weakness (generalized): Secondary | ICD-10-CM | POA: Diagnosis not present

## 2020-01-23 DIAGNOSIS — M25561 Pain in right knee: Secondary | ICD-10-CM | POA: Diagnosis not present

## 2020-01-23 DIAGNOSIS — G8929 Other chronic pain: Secondary | ICD-10-CM

## 2020-01-23 NOTE — Therapy (Signed)
[place], [place], [address] Phone: [phone]   Fax:  [phone]  Physical Therapy Treatment  Patient Details  Name: Deborah [name] MRN: [id number] Date of Birth: 12/14/1955 Referring Provider (PT): Aundra Dubin, [place]   Encounter Date: 01/23/2020   PT End of Session - 01/23/20 0911    Visit Number 7    Number of Visits 17    Date for PT Re-Evaluation 02/22/20    Authorization Type [employer] BLUE    Authorization Time Period 01/04/2020 - 02/29/2020    Authorization - Visit Number 6    Authorization - Number of Visits 16    PT Start Time 0915    PT Stop Time 0956    PT Time Calculation (min) 41 min    Activity Tolerance Patient tolerated treatment well    Behavior During Therapy [facility] for tasks assessed/performed           Past Medical History:  Diagnosis Date  . Anemia    h/o of  . Arthritis    in knees  . Asthma   . Breast cancer ([place])   . Bronchitis   . COPD (chronic obstructive pulmonary disease) ([place])   . Diverticulosis 11/15/2012    noted on screening colonoscopy   . Dyspnea    with COPD exerbation  . Esophageal stricture   . Former smoker 03/19/2011  . GERD (gastroesophageal reflux disease)   . Heart murmur    asymptomatic   . History of laryngectomy   . History of radiation therapy 11/15/18- 12/06/18   Right Breast total dose 42.56 Gy in 16 fractions.   [name] Hx of radiation therapy 09/03/10 to 10/16/2010   supraglottic larynx  . Hypertension   . 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 Internal hemorrhoid 11/15/2012    small, noted on screening colonoscopy   . 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  . Leukocytopenia   . Nausea alone 07/28/2013  . Neck pain 01/21/2012  . Normal MRI 07/14/11   negative for mestasis   . Pneumonia 2012  . Sciatica   . Seizures ([place])    07/24/11 off Effexor w/o seizure  . Sepsis ([place]) 08/04/12  .  Sinusitis, chronic 07/20/2011   Bilateral maxillary, identified on MRI of head 07/14/11.    . Tracheostomy dependent [name])     Past Surgical History:  Procedure Laterality Date  . BREAST LUMPECTOMY Right 07/06/2018  . BREAST LUMPECTOMY WITH RADIOACTIVE SEED LOCALIZATION Right 07/06/2018   Procedure: RIGHT BREAST LUMPECTOMY WITH RADIOACTIVE SEED LOCALIZATION;  Surgeon: Rolm Bookbinder, MD;  Location: [place];  Service: General;  Laterality: Right;  . COLONOSCOPY N/A 11/15/2012   Procedure: COLONOSCOPY;  Surgeon: Lafayette Dragon, MD;  [facility];  Service: Endoscopy;  Laterality: N/A;  . DENTAL RESTORATION/EXTRACTION WITH X-RAY    . ESOPHAGEAL DILATION N/A 09/09/2019   Procedure: ESOPHAGEAL DILATION;  Surgeon: [name], MD;  Location: [place];  Service: ENT;  Laterality: N/A;  . ESOPHAGEAL DILATION N/A 10/05/2019   Procedure: ESOPHAGEAL DILATION;  Surgeon: [name], MD;  Location: [place];  Service: ENT;  Laterality: N/A;  via tracheostomy  . ESOPHAGOGASTRODUODENOSCOPY (EGD) WITH PROPOFOL N/A 08/12/2018   Procedure: ESOPHAGOGASTRODUODENOSCOPY (EGD) WITH PROPOFOL;  Surgeon: Doran Stabler, MD;  Location: [name];  Service: Gastroenterology;  Laterality: N/A;  . ESOPHAGOSCOPY  06/21/2012   Procedure: ESOPHAGOSCOPY;  Surgeon: [name],  MD;  Location: [place];  Service: ENT;  Laterality: N/A;  . ESOPHAGOSCOPY WITH DILITATION N/A 09/21/2014   Procedure: ESOPHAGOSCOPY WITH DILITATION;  Surgeon: [name], MD;  Location: [place];  Service: ENT;  Laterality: N/A;  . ESOPHAGOSCOPY WITH DILITATION N/A 07/04/2016   Procedure: ESOPHAGOSCOPY WITH DILITATION;  Surgeon: [name], MD;  Location: [place];  Service: ENT;  Laterality: N/A;  . ESOPHAGOSCOPY WITH DILITATION N/A 12/01/2017   Procedure: ESOPHAGOSCOPY WITH DILITATION;  Surgeon: [name], MD;  Location: [place];  Service: ENT;  Laterality: N/A;  . ESOPHAGOSCOPY WITH DILITATION  N/A 04/19/2018   Procedure: ESOPHAGOSCOPY WITH DILITATION;  Surgeon: [name], MD;  Location: [place];  Service: ENT;  Laterality: N/A;  . ESOPHAGOSCOPY WITH DILITATION N/A 03/07/2019   Procedure: Esophagoscopy with dilatation;  Surgeon: [name], MD;  Location: [place];  Service: ENT;  Laterality: N/A;  . FLEXIBLE BRONCHOSCOPY  01/08/2018      . FOREIGN BODY REMOVAL BRONCHIAL  10/02/2011   Procedure: REMOVAL FOREIGN BODY BRONCHIAL;  Surgeon: Ruby Cola, MD;  Location: [place];  Service: ENT;  Laterality: N/A;  . FOREIGN BODY REMOVAL BRONCHIAL N/A 01/08/2018   Procedure: REMOVAL FOREIGN BODY BRONCHIAL;  Surgeon: Jodi Marble, MD;  [facility];  Service: ENT;  Laterality: N/A;  . LARYNGECTOMY    . Porta cath removal    . PORTACATH PLACEMENT  09/17/10   Tip in cavoatrial junction  . RE-EXCISION OF BREAST CANCER,SUPERIOR MARGINS Right 07/29/2018   Procedure: RE-EXCISION OF RIGHT BREAST MEDIAL MARGINS;  Surgeon: Rolm Bookbinder, MD;  Location: [place];  Service: General;  Laterality: Right;  . RIGID ESOPHAGOSCOPY N/A 03/19/2019   Procedure: FLEXIBLE ESOPHAGOSCOPY;  Surgeon: Jodi Marble, MD;  Location: [place];  Service: ENT;  Laterality: N/A;  . STOMAPLASTY N/A 10/21/2016   Procedure: Zola Button;  Surgeon: [name], MD;  Location: [place];  Service: ENT;  Laterality: N/A;  . TOTAL KNEE ARTHROPLASTY Right 12/12/2019   Procedure: RIGHT TOTAL KNEE ARTHROPLASTY;  Surgeon: Leandrew Koyanagi, MD;  Location: [place];  Service: Orthopedics;  Laterality: Right;  . TRACHEAL DILITATION  07/16/2011   Procedure: TRACHEAL DILITATION;  Surgeon: Beckie Salts, MD;  Location: [place];  Service: ENT;  Laterality: N/A;  dilation of tracheal stoma and replacement of stoma tube  . TUBAL LIGATION  1982    There were no vitals filed for this visit.   Subjective Assessment - 01/23/20 0910    Subjective Patient reports she is doing well. No new issues noted.    Patient Stated Goals Get rid of the  pain and back to walking    Currently in Pain? Yes    Pain Score 3     Pain Location Knee    Pain Orientation Right    Pain Descriptors / Indicators Aching;Tightness;Sore    Pain Type Surgical pain    Pain Onset More than a month ago    Pain Frequency Intermittent              [facility] PT Assessment - 01/23/20 0001      Assessment   Medical Diagnosis Right TKA    Onset Date/Surgical Date 12/12/19      AROM   Right Knee Extension -3   lacking   Right Knee Flexion 125                         [facility] Adult PT Treatment/Exercise - 01/23/20  0001      Neuro Re-ed    Neuro Re-ed Details  SLS 5 x 20 sec each      Exercises   Exercises Knee/Hip      Knee/Hip Exercises: Stretches   Passive Hamstring Stretch 2 reps;20 seconds    Hip Flexor Stretch 2 reps;30 seconds    Hip Flexor Stretch Limitations supine edge of mat    Gastroc Stretch 2 reps;30 seconds    Gastroc Stretch Limitations slant board      Knee/Hip Exercises: Aerobic   Nustep L5 x 5 min (UE and LE)      Knee/Hip Exercises: Machines for Strengthening   Cybex Knee Extension 25# 2x10    Cybex Leg Press 65# x15, 75# 2x15      Knee/Hip Exercises: Standing   Heel Raises 2 sets;20 reps    Lateral Step Up 2 sets;10 reps    Lateral Step Up Limitations 6" step-height    Forward Step Up 2 sets;10 reps    Forward Step Up Limitations 8" step-height      Knee/Hip Exercises: Seated   Sit to Sand 10 reps;without UE support      Knee/Hip Exercises: Supine   Bridges 10 reps      Knee/Hip Exercises: Sidelying   Hip ABduction 2 sets;10 reps      Manual Therapy   Manual Therapy Soft tissue mobilization;Passive ROM    Soft tissue mobilization Quad, hamstring, calf with FR    Passive ROM Knee flexion and extension to tolerance                  PT Education - 01/23/20 0910    Education Details HEP    Person(s) Educated Patient    Methods Explanation;Demonstration;Verbal cues    Comprehension  Verbalized understanding;Returned demonstration;Verbal cues required;Need further instruction            PT Short Term Goals - 01/16/20 0833      PT SHORT TERM GOAL #1   Title Patient will be I with initial HEP to progress with PT    Baseline Patient continues to require cueing for technique - 01/16/2020    Time 3    Period Weeks    Status On-going    Target Date 01/18/20      PT SHORT TERM GOAL #2   Title Patient will exhibit improved AROM knee flexion to >/= 110 deg and knee extension to </= 5 deg lacking from neutral to improve gait and transfers    Baseline Flexion: 120, Extension: 4 lacking - 01/12/2020    Time 3    Period Weeks    Status Achieved    Target Date 01/18/20      PT SHORT TERM GOAL #3   Title Patient will report </= 6/10 pain level with activity to improve functional ability    Baseline 4/10 pain level - 01/16/2020    Time 3    Period Weeks    Status Achieved    Target Date 01/18/20             PT Long Term Goals - 12/28/19 1334      PT LONG TERM GOAL #1   Title Patient will be I with final HEP to maintain progress from PT    Time 8    Period Weeks    Status New    Target Date 02/22/20      PT LONG TERM GOAL #2   Title Patient will demonstrated right knee AROM  WFL to all for normalalized transfers and gait    Time 8    Period Weeks    Status New    Target Date 02/22/20      PT LONG TERM GOAL #3   Title Patient will exhibit right knee strength grossly 5/5 to improve stair negotiation and activity tolerance    Time 8    Period Weeks    Status New    Target Date 02/22/20      PT LONG TERM GOAL #4   Title Patient will report no limitation with walking or standing    Time 8    Period Weeks    Status New    Target Date 02/22/20                 Plan - 01/23/20 0911    Clinical Impression Statement Patient tolerated therapy well with no adverse effects.  She continues to progress well with her strengthening and she exhibits good knee  range of motion. She does demonstrate limitation with single leg balance but overall is improving. She does require occasional cueing for proper technique with sit<>stand and step-ups, and she reports greater difficulty on left side. She would benefit from continued skilled PT to progress motion and strength to improve walking ability and maximize functional level.    PT Treatment/Interventions ADLs/Self Care Home Management;Cryotherapy;Electrical Stimulation;Moist Heat;Neuromuscular re-education;Balance training;Therapeutic exercise;Therapeutic activities;Functional mobility training;Stair training;Gait training;Patient/family education;Manual techniques;Dry needling;Passive range of motion;Taping;Vasopneumatic Device;Joint Manipulations    PT Next Visit Plan Assess HEP and progress PRN, bike, manual and stretching for improving knee motion, progress strength as tolerated, gait and balance training, *cannot bill for vaso    PT Home Exercise Plan [id number]: heel prop, quad set, standing calf stretch with strap, supine heel slide with strap, seated hamstring stretch, seated knee AROM; SLR, hip abduction, hip extension, bridge, banded LAQ and standing hamstring curl, heel raises, sit<>stand, SLS    Consulted and Agree with Plan of Care Patient           Patient will benefit from skilled therapeutic intervention in order to improve the following deficits and impairments:  Abnormal gait, Decreased range of motion, Difficulty walking, Pain, Decreased activity tolerance, Decreased balance, Decreased strength, Improper body mechanics, Impaired flexibility  Visit Diagnosis: Chronic pain of right knee  Stiffness of right knee, not elsewhere classified  Muscle weakness (generalized)  Other abnormalities of gait and mobility  Localized edema     Problem List Patient Active Problem List   Diagnosis Date Noted  . Status post total right knee replacement 12/12/2019  . Primary osteoarthritis of right  knee 12/11/2019  . Epidermal cyst of neck 07/21/2019  . Localized swelling, mass and lump, neck 07/20/2019  . Allergic rhinitis 05/20/2019  . GERD (gastroesophageal reflux disease)   . COPD (chronic obstructive pulmonary disease) ([place])   . Chronic diastolic (congestive) heart failure ([place])   . Foreign body aspiration   . Breast cancer ([place]) 07/06/2018  . 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[place]) 04/08/2018  . Carpal tunnel syndrome 03/16/2018  . Obesity 08/24/2017  . Plantar fasciitis 06/06/2016  . Low back pain 05/21/2016  . Health care maintenance 12/29/2015  . Osteoarthritis of knees, bilateral 05/31/2015  . Knee pain 01/04/2015  . Dysphagia 08/16/2014  . Weight gain 08/05/2014  . DOE (dyspnea on exertion) 04/28/2014  . Frequent falls 12/23/2013  . Bilateral shoulder pain 04/07/2013  . Anemia in chronic  illness 08/11/2012  . Status post trachelectomy 08/04/2012  . Chronic pain 01/20/2012  . History of head and neck cancer 09/26/2011  . Hx of radiation therapy   . Tracheitis 07/17/2011  . Seizure ([place]) 07/17/2011  . Hypothyroidism 03/19/2011  . Essential hypertension 03/19/2011  . 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 [place], [place], [address] Phone: [phone]   Fax:  [phone]  Name: Deborah [name] MRN: [id number] Date of Birth: Aug 24, 1955

## 2020-01-24 ENCOUNTER — Ambulatory Visit (INDEPENDENT_AMBULATORY_CARE_PROVIDER_SITE_OTHER): Payer: Medicaid Other | Admitting: Orthopaedic Surgery

## 2020-01-24 ENCOUNTER — Ambulatory Visit (INDEPENDENT_AMBULATORY_CARE_PROVIDER_SITE_OTHER): Payer: Medicaid Other

## 2020-01-24 VITALS — Ht 67.25 in | Wt 214.0 lb

## 2020-01-24 DIAGNOSIS — Z96651 Presence of right artificial knee joint: Secondary | ICD-10-CM

## 2020-01-24 MED ORDER — DICLOFENAC SODIUM 1 % EX GEL: 2.0000 g | Freq: Four times a day (QID) | CUTANEOUS | 0 refills | Status: DC

## 2020-01-24 MED ORDER — METHOCARBAMOL 500 MG PO TABS: 500.0000 mg | ORAL_TABLET | Freq: Two times a day (BID) | ORAL | 0 refills | Status: DC | PRN

## 2020-01-24 MED ORDER — HYDROCODONE-ACETAMINOPHEN 5-325 MG PO TABS: 1.0000 | ORAL_TABLET | Freq: Three times a day (TID) | ORAL | 0 refills | Status: DC | PRN

## 2020-01-24 NOTE — Progress Notes (Signed)
Post-Op Visit Note   Patient: Deborah [name]           Date of Birth: 10-Nov-1955           MRN: [id number] Visit Date: 01/24/2020 PCP: Benay Pike, MD   Assessment & Plan:  Chief Complaint:  Chief Complaint  Patient presents with  . Right Knee - Follow-up   Visit Diagnoses:  1. S/P total knee replacement, right   2. H/O total knee replacement, right     Plan: Patient is a pleasant 64 year old female who presents our clinic today 6 weeks status post right total knee replacement 12/12/2019.  She has been doing well.  She is in physical therapy making great progress.  She does admit to moderate pain with exercise.  The majority of this is over the pes bursa.  No fevers or chills.  Examination of her right knee reveals a fully healed surgical scar without complication.  Range of motion 0 to 125 degrees.  She is neurovascularly intact distally.  Stable valgus varus stress.  At this point, I have refilled her pain medication.  Also called in Voltaren gel to use as needed.  She will continue with physical therapy and follow-up with [place] in 6 weeks time for recheck.  Follow-Up Instructions: Return in about 6 weeks (around 03/06/2020).   Orders:  Orders Placed This Encounter  Procedures  . XR Knee 1-2 Views Right   No orders of the defined types were placed in this encounter.   Imaging: XR Knee 1-2 Views Right  Result Date: 01/24/2020 Well-seated prosthesis without complication   PMFS History: Patient Active Problem List   Diagnosis Date Noted  . Status post total right knee replacement 12/12/2019  . Primary osteoarthritis of right knee 12/11/2019  . Epidermal cyst of neck 07/21/2019  . Localized swelling, mass and lump, neck 07/20/2019  . Allergic rhinitis 05/20/2019  . GERD (gastroesophageal reflux disease)   . COPD (chronic obstructive pulmonary disease) ([place])   . Chronic diastolic (congestive) heart failure ([place])   . Foreign body aspiration   . Breast cancer  ([place]) 07/06/2018  . 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[place]) 04/08/2018  . Carpal tunnel syndrome 03/16/2018  . Obesity 08/24/2017  . Plantar fasciitis 06/06/2016  . Low back pain 05/21/2016  . Health care maintenance 12/29/2015  . Osteoarthritis of knees, bilateral 05/31/2015  . Knee pain 01/04/2015  . Dysphagia 08/16/2014  . Weight gain 08/05/2014  . DOE (dyspnea on exertion) 04/28/2014  . Frequent falls 12/23/2013  . Bilateral shoulder pain 04/07/2013  . Anemia in chronic illness 08/11/2012  . Status post trachelectomy 08/04/2012  . Chronic pain 01/20/2012  . History of head and neck cancer 09/26/2011  . Hx of radiation therapy   . Tracheitis 07/17/2011  . Seizure ([place]) 07/17/2011  . Hypothyroidism 03/19/2011  . Essential hypertension 03/19/2011  . 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 Anemia    h/o of  . Arthritis    in knees  . Asthma   . Breast cancer ([place])   . Bronchitis   . COPD (chronic obstructive pulmonary disease) ([place])   . Diverticulosis 11/15/2012    noted on screening colonoscopy   . Dyspnea    with COPD exerbation  . Esophageal stricture   . Former smoker 03/19/2011  . GERD (gastroesophageal reflux disease)   . Heart murmur  asymptomatic   . History of laryngectomy   . History of radiation therapy 11/15/18- 12/06/18   Right Breast total dose 42.56 Gy in 16 fractions.   [name] Hx of radiation therapy 09/03/10 to 10/16/2010   supraglottic larynx  . Hypertension   . 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 Internal hemorrhoid 11/15/2012    small, noted on screening colonoscopy   . 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  . Leukocytopenia   . Nausea alone 07/28/2013  . Neck pain 01/21/2012  . Normal MRI 07/14/11   negative for mestasis   . Pneumonia 2012  . Sciatica   . Seizures ([place])    07/24/11 off Effexor  w/o seizure  . Sepsis ([place]) 08/04/12  . Sinusitis, chronic 07/20/2011   Bilateral maxillary, identified on MRI of head 07/14/11.    . 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[place])     Family History  Problem Relation Age of Onset  . Heart disease Mother   . Heart disease Father   . Heart disease Sister   . Cancer Brother        type unknown  . Liver disease Maternal Grandmother   . Cancer Sister        type unknown  . Diabetes Sister   . Breast cancer Neg Hx     Past Surgical History:  Procedure Laterality Date  . BREAST LUMPECTOMY Right 07/06/2018  . BREAST LUMPECTOMY WITH RADIOACTIVE SEED LOCALIZATION Right 07/06/2018   Procedure: RIGHT BREAST LUMPECTOMY WITH RADIOACTIVE SEED LOCALIZATION;  Surgeon: Rolm Bookbinder, MD;  Location: [place];  Service: General;  Laterality: Right;  . COLONOSCOPY N/A 11/15/2012   Procedure: COLONOSCOPY;  Surgeon: Lafayette Dragon, MD;  [facility];  Service: Endoscopy;  Laterality: N/A;  . DENTAL RESTORATION/EXTRACTION WITH X-RAY    . ESOPHAGEAL DILATION N/A 09/09/2019   Procedure: ESOPHAGEAL DILATION;  Surgeon: [name], MD;  Location: [place];  Service: ENT;  Laterality: N/A;  . ESOPHAGEAL DILATION N/A 10/05/2019   Procedure: ESOPHAGEAL DILATION;  Surgeon: [name], MD;  Location: [place];  Service: ENT;  Laterality: N/A;  via tracheostomy  . ESOPHAGOGASTRODUODENOSCOPY (EGD) WITH PROPOFOL N/A 08/12/2018   Procedure: ESOPHAGOGASTRODUODENOSCOPY (EGD) WITH PROPOFOL;  Surgeon: Doran Stabler, MD;  Location: [place];  Service: Gastroenterology;  Laterality: N/A;  . ESOPHAGOSCOPY  06/21/2012   Procedure: ESOPHAGOSCOPY;  Surgeon: [name], MD;  Location: [place];  Service: ENT;  Laterality: N/A;  . ESOPHAGOSCOPY WITH DILITATION N/A 09/21/2014   Procedure: ESOPHAGOSCOPY WITH DILITATION;  Surgeon: [name], MD;  Location: [place];  Service: ENT;  Laterality: N/A;  . ESOPHAGOSCOPY WITH DILITATION N/A  07/04/2016   Procedure: ESOPHAGOSCOPY WITH DILITATION;  Surgeon: [name], MD;  Location: [place];  Service: ENT;  Laterality: N/A;  . ESOPHAGOSCOPY WITH DILITATION N/A 12/01/2017   Procedure: ESOPHAGOSCOPY WITH DILITATION;  Surgeon: [name], MD;  Location: [place];  Service: ENT;  Laterality: N/A;  . ESOPHAGOSCOPY WITH DILITATION N/A 04/19/2018   Procedure: ESOPHAGOSCOPY WITH DILITATION;  Surgeon: [name], MD;  Location: [place];  Service: ENT;  Laterality: N/A;  . ESOPHAGOSCOPY WITH DILITATION N/A 03/07/2019   Procedure: Esophagoscopy with dilatation;  Surgeon: [name], MD;  Location: [place];  Service: ENT;  Laterality: N/A;  . FLEXIBLE BRONCHOSCOPY  01/08/2018      . FOREIGN BODY REMOVAL BRONCHIAL  10/02/2011   Procedure: REMOVAL FOREIGN BODY BRONCHIAL;  Surgeon: Ruby Cola, MD;  Location[facility];  Service: ENT;  Laterality: N/A;  . FOREIGN BODY REMOVAL BRONCHIAL N/A 01/08/2018   Procedure: REMOVAL FOREIGN BODY BRONCHIAL;  Surgeon: Jodi Marble, MD;  [facility];  Service: ENT;  Laterality: N/A;  . LARYNGECTOMY    . Porta cath removal    . PORTACATH PLACEMENT  09/17/10   Tip in cavoatrial junction  . RE-EXCISION OF BREAST CANCER,SUPERIOR MARGINS Right 07/29/2018   Procedure: RE-EXCISION OF RIGHT BREAST MEDIAL MARGINS;  Surgeon: Rolm Bookbinder, MD;  Location: [place];  Service: General;  Laterality: Right;  . RIGID ESOPHAGOSCOPY N/A 03/19/2019   Procedure: FLEXIBLE ESOPHAGOSCOPY;  Surgeon: Jodi Marble, MD;  Location: [place];  Service: ENT;  Laterality: N/A;  . STOMAPLASTY N/A 10/21/2016   Procedure: Zola Button;  Surgeon: [name], MD;  Location: [place];  Service: ENT;  Laterality: N/A;  . TOTAL KNEE ARTHROPLASTY Right 12/12/2019   Procedure: RIGHT TOTAL KNEE ARTHROPLASTY;  Surgeon: Leandrew Koyanagi, MD;  Location: [place];  Service: Orthopedics;  Laterality: Right;  . TRACHEAL DILITATION  07/16/2011   Procedure: TRACHEAL DILITATION;  Surgeon: Beckie Salts,  MD;  Location: [place];  Service: ENT;  Laterality: N/A;  dilation of tracheal stoma and replacement of stoma tube  . TUBAL LIGATION  1982   Social History   Occupational History  . Occupation: part time    Comment: warehouse work  Tobacco Use  . Smoking status: Former Smoker    Packs/day: 2.00    Years: 20.00    Pack years: 40.00    Types: Cigarettes    Quit date: 09/17/2010    Years since quitting: 9.3  . Smokeless tobacco: Never Used  Vaping Use  . Vaping Use: Never used  Substance and Sexual Activity  . Alcohol use: Yes    Comment: Socially drinks   . Drug use: No    Comment: tried cocaine 1 time 2010 only used 1 time  . Sexual activity: Yes    Birth control/protection: Surgical

## 2020-01-25 ENCOUNTER — Encounter: Payer: Self-pay | Admitting: Physical Therapy

## 2020-01-25 ENCOUNTER — Other Ambulatory Visit: Payer: Self-pay

## 2020-01-25 ENCOUNTER — Ambulatory Visit: Payer: Medicaid Other | Admitting: Physical Therapy

## 2020-01-25 DIAGNOSIS — R2689 Other abnormalities of gait and mobility: Secondary | ICD-10-CM | POA: Diagnosis not present

## 2020-01-25 DIAGNOSIS — M25661 Stiffness of right knee, not elsewhere classified: Secondary | ICD-10-CM

## 2020-01-25 DIAGNOSIS — R600 Localized edema: Secondary | ICD-10-CM | POA: Diagnosis not present

## 2020-01-25 DIAGNOSIS — M6281 Muscle weakness (generalized): Secondary | ICD-10-CM

## 2020-01-25 DIAGNOSIS — M25561 Pain in right knee: Secondary | ICD-10-CM | POA: Diagnosis not present

## 2020-01-25 DIAGNOSIS — G8929 Other chronic pain: Secondary | ICD-10-CM | POA: Diagnosis not present

## 2020-01-25 NOTE — Therapy (Signed)
[place], [place], [address] Phone: [phone]   Fax:  [phone]  Physical Therapy Treatment  Patient Details  Name: Deborah [name] MRN: [id number] Date of Birth: 1956/04/12 Referring Provider (PT): Aundra Dubin, [place]   Encounter Date: 01/25/2020   PT End of Session - 01/25/20 0833    Visit Number 8    Number of Visits 17    Date for PT Re-Evaluation 02/22/20    Authorization Type [employer]    Authorization Time Period 01/04/2020 - 02/29/2020    Authorization - Visit Number 7    Authorization - Number of Visits 16    PT Start Time 0828    PT Stop Time 0910    PT Time Calculation (min) 42 min    Activity Tolerance Patient tolerated treatment well    Behavior During Therapy [facility] for tasks assessed/performed           Past Medical History:  Diagnosis Date  . Anemia    h/o of  . Arthritis    in knees  . Asthma   . Breast cancer ([place])   . Bronchitis   . COPD (chronic obstructive pulmonary disease) ([place])   . Diverticulosis 11/15/2012    noted on screening colonoscopy   . Dyspnea    with COPD exerbation  . Esophageal stricture   . Former smoker 03/19/2011  . GERD (gastroesophageal reflux disease)   . Heart murmur    asymptomatic   . History of laryngectomy   . History of radiation therapy 11/15/18- 12/06/18   Right Breast total dose 42.56 Gy in 16 fractions.   [name] Hx of radiation therapy 09/03/10 to 10/16/2010   supraglottic larynx  . Hypertension   . 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 Internal hemorrhoid 11/15/2012    small, noted on screening colonoscopy   . 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  . Leukocytopenia   . Nausea alone 07/28/2013  . Neck pain 01/21/2012  . Normal MRI 07/14/11   negative for mestasis   . Pneumonia 2012  . Sciatica   . Seizures ([place])    07/24/11 off Effexor w/o seizure  . Sepsis ([place]) 08/04/12  .  Sinusitis, chronic 07/20/2011   Bilateral maxillary, identified on MRI of head 07/14/11.    . Tracheostomy dependent [facility])     Past Surgical History:  Procedure Laterality Date  . BREAST LUMPECTOMY Right 07/06/2018  . BREAST LUMPECTOMY WITH RADIOACTIVE SEED LOCALIZATION Right 07/06/2018   Procedure: RIGHT BREAST LUMPECTOMY WITH RADIOACTIVE SEED LOCALIZATION;  Surgeon: Rolm Bookbinder, MD;  Location: [place];  Service: General;  Laterality: Right;  . COLONOSCOPY N/A 11/15/2012   Procedure: COLONOSCOPY;  Surgeon: Lafayette Dragon, MD;  [facility];  Service: Endoscopy;  Laterality: N/A;  . DENTAL RESTORATION/EXTRACTION WITH X-RAY    . ESOPHAGEAL DILATION N/A 09/09/2019   Procedure: ESOPHAGEAL DILATION;  Surgeon: [name], MD;  Location: [place];  Service: ENT;  Laterality: N/A;  . ESOPHAGEAL DILATION N/A 10/05/2019   Procedure: ESOPHAGEAL DILATION;  Surgeon: [name], MD;  Location: [place];  Service: ENT;  Laterality: N/A;  via tracheostomy  . ESOPHAGOGASTRODUODENOSCOPY (EGD) WITH PROPOFOL N/A 08/12/2018   Procedure: ESOPHAGOGASTRODUODENOSCOPY (EGD) WITH PROPOFOL;  Surgeon: Doran Stabler, MD;  Location: [place];  Service: Gastroenterology;  Laterality: N/A;  . ESOPHAGOSCOPY  06/21/2012   Procedure: ESOPHAGOSCOPY;  Surgeon: [name],  MD;  Location: [place];  Service: ENT;  Laterality: N/A;  . ESOPHAGOSCOPY WITH DILITATION N/A 09/21/2014   Procedure: ESOPHAGOSCOPY WITH DILITATION;  Surgeon: [name], MD;  Location: [place];  Service: ENT;  Laterality: N/A;  . ESOPHAGOSCOPY WITH DILITATION N/A 07/04/2016   Procedure: ESOPHAGOSCOPY WITH DILITATION;  Surgeon: [name], MD;  Location: [place];  Service: ENT;  Laterality: N/A;  . ESOPHAGOSCOPY WITH DILITATION N/A 12/01/2017   Procedure: ESOPHAGOSCOPY WITH DILITATION;  Surgeon: [name], MD;  Location: [place];  Service: ENT;  Laterality: N/A;  . ESOPHAGOSCOPY WITH DILITATION  N/A 04/19/2018   Procedure: ESOPHAGOSCOPY WITH DILITATION;  Surgeon: [name], MD;  Location: [place];  Service: ENT;  Laterality: N/A;  . ESOPHAGOSCOPY WITH DILITATION N/A 03/07/2019   Procedure: Esophagoscopy with dilatation;  Surgeon: [name], MD;  Location: [place];  Service: ENT;  Laterality: N/A;  . FLEXIBLE BRONCHOSCOPY  01/08/2018      . FOREIGN BODY REMOVAL BRONCHIAL  10/02/2011   Procedure: REMOVAL FOREIGN BODY BRONCHIAL;  Surgeon: Ruby Cola, MD;  Location: [place];  Service: ENT;  Laterality: N/A;  . FOREIGN BODY REMOVAL BRONCHIAL N/A 01/08/2018   Procedure: REMOVAL FOREIGN BODY BRONCHIAL;  Surgeon: Jodi Marble, MD;  Location: [facility];  Service: ENT;  Laterality: N/A;  . LARYNGECTOMY    . Porta cath removal    . PORTACATH PLACEMENT  09/17/10   Tip in cavoatrial junction  . RE-EXCISION OF BREAST CANCER,SUPERIOR MARGINS Right 07/29/2018   Procedure: RE-EXCISION OF RIGHT BREAST MEDIAL MARGINS;  Surgeon: Rolm Bookbinder, MD;  Location: [place];  Service: General;  Laterality: Right;  . RIGID ESOPHAGOSCOPY N/A 03/19/2019   Procedure: FLEXIBLE ESOPHAGOSCOPY;  Surgeon: Jodi Marble, MD;  Location: [place];  Service: ENT;  Laterality: N/A;  . STOMAPLASTY N/A 10/21/2016   Procedure: Zola Button;  Surgeon: [name], MD;  Location: [place];  Service: ENT;  Laterality: N/A;  . TOTAL KNEE ARTHROPLASTY Right 12/12/2019   Procedure: RIGHT TOTAL KNEE ARTHROPLASTY;  Surgeon: Leandrew Koyanagi, MD;  Location: [place];  Service: Orthopedics;  Laterality: Right;  . TRACHEAL DILITATION  07/16/2011   Procedure: TRACHEAL DILITATION;  Surgeon: Beckie Salts, MD;  Location: [place];  Service: ENT;  Laterality: N/A;  dilation of tracheal stoma and replacement of stoma tube  . TUBAL LIGATION  1982    There were no vitals filed for this visit.   Subjective Assessment - 01/25/20 0828    Subjective Patient reports continues improvement of knee. She feels stiff this morning.    Patient Stated  Goals Get rid of the pain and back to walking    Currently in Pain? Yes    Pain Score 7     Pain Location Knee    Pain Orientation Right    Pain Descriptors / Indicators Tightness    Pain Type Surgical pain    Pain Onset More than a month ago    Pain Frequency Intermittent              [facility] PT Assessment - 01/25/20 0001      Assessment   Medical Diagnosis Right TKA    Onset Date/Surgical Date 12/12/19                         [facility] Adult PT Treatment/Exercise - 01/25/20 0001      Exercises   Exercises Knee/Hip      Knee/Hip Exercises: Stretches  Passive Hamstring Stretch 3 reps;30 seconds    Passive Hamstring Stretch Limitations 3-way hamstring stretch supine    Quad Stretch 2 reps;30 seconds    Quad Stretch Limitations prone    Hip Flexor Stretch 2 reps;30 seconds    Hip Flexor Stretch Limitations supine edge of mat    Gastroc Stretch 3 reps;30 seconds    Gastroc Stretch Limitations slant board      Knee/Hip Exercises: Standing   Heel Raises 2 sets;20 reps    Knee Flexion 2 sets;10 reps    Knee Flexion Limitations green band    Lateral Step Up 2 sets;10 reps    Lateral Step Up Limitations 8" step-height    Forward Step Up 2 sets;10 reps    Forward Step Up Limitations 8" step-height      Knee/Hip Exercises: Seated   Long Arc Quad 2 sets;10 reps    Long Arc Quad Limitations green band    Sit to [employer] 2 sets;10 reps;without UE support   focus on slow controlled movement                 PT Education - 01/25/20 [id number]    Education Details HEP update    Person(s) Educated Patient    Methods Explanation;Demonstration;Verbal cues;Handout    Comprehension Verbalized understanding;Returned demonstration;Verbal cues required;Need further instruction            PT Short Term Goals - 01/16/20 8638      PT SHORT TERM GOAL #1   Title Patient will be I with initial HEP to progress with PT    Baseline Patient continues to require cueing for  technique - 01/16/2020    Time 3    Period Weeks    Status On-going    Target Date 01/18/20      PT SHORT TERM GOAL #2   Title Patient will exhibit improved AROM knee flexion to >/= 110 deg and knee extension to </= 5 deg lacking from neutral to improve gait and transfers    Baseline Flexion: 120, Extension: 4 lacking - 01/12/2020    Time 3    Period Weeks    Status Achieved    Target Date 01/18/20      PT SHORT TERM GOAL #3   Title Patient will report </= 6/10 pain level with activity to improve functional ability    Baseline 4/10 pain level - 01/16/2020    Time 3    Period Weeks    Status Achieved    Target Date 01/18/20             PT Long Term Goals - 12/28/19 1334      PT LONG TERM GOAL #1   Title Patient will be I with final HEP to maintain progress from PT    Time 8    Period Weeks    Status New    Target Date 02/22/20      PT LONG TERM GOAL #2   Title Patient will demonstrated right knee AROM WFL to all for normalalized transfers and gait    Time 8    Period Weeks    Status New    Target Date 02/22/20      PT LONG TERM GOAL #3   Title Patient will exhibit right knee strength grossly 5/5 to improve stair negotiation and activity tolerance    Time 8    Period Weeks    Status New    Target Date 02/22/20  PT LONG TERM GOAL #4   Title Patient will report no limitation with walking or standing    Time 8    Period Weeks    Status New    Target Date 02/22/20                 Plan - 01/25/20 0837    Clinical Impression Statement Patient tolerated therapy well with no adverse effects. She is progressing very well with her motion and tolerated progression in strengthening well without any report of increased pain. She does note stiffness and generally higher levels of discomfort but these do not limit her exercise and she does not demonstrate any increases in swelling. Updated HEP this visit with good tolerance. She would benefit from continued skilled PT  to progress motion and strength to improve walking ability and maximize functional level.    PT Treatment/Interventions ADLs/Self Care Home Management;Cryotherapy;Electrical Stimulation;Moist Heat;Neuromuscular re-education;Balance training;Therapeutic exercise;Therapeutic activities;Functional mobility training;Stair training;Gait training;Patient/family education;Manual techniques;Dry needling;Passive range of motion;Taping;Vasopneumatic Device;Joint Manipulations    PT Next Visit Plan Assess HEP and progress PRN, bike, manual and stretching for improving knee motion, progress strength as tolerated, gait and balance training, *cannot bill for vaso    PT Home Exercise Plan [email or web address]: heel prop, quad set, standing calf stretch with strap, supine heel slide with strap, seated hamstring stretch, seated knee AROM; SLR, hip abduction, hip extension, bridge, banded LAQ and standing hamstring curl, heel raises, sit<>stand, SLS    Consulted and Agree with Plan of Care Patient           Patient will benefit from skilled therapeutic intervention in order to improve the following deficits and impairments:  Abnormal gait, Decreased range of motion, Difficulty walking, Pain, Decreased activity tolerance, Decreased balance, Decreased strength, Improper body mechanics, Impaired flexibility  Visit Diagnosis: Chronic pain of right knee  Stiffness of right knee, not elsewhere classified  Muscle weakness (generalized)  Other abnormalities of gait and mobility  Localized edema     Problem List Patient Active Problem List   Diagnosis Date Noted  . Status post total right knee replacement 12/12/2019  . Primary osteoarthritis of right knee 12/11/2019  . Epidermal cyst of neck 07/21/2019  . Localized swelling, mass and lump, neck 07/20/2019  . Allergic rhinitis 05/20/2019  . GERD (gastroesophageal reflux disease)   . COPD (chronic obstructive pulmonary disease) ([place])   . Chronic diastolic (congestive)  heart failure ([place])   . Foreign body aspiration   . Breast cancer ([place]) 07/06/2018  . 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[place]) 04/08/2018  . Carpal tunnel syndrome 03/16/2018  . Obesity 08/24/2017  . Plantar fasciitis 06/06/2016  . Low back pain 05/21/2016  . Health care maintenance 12/29/2015  . Osteoarthritis of knees, bilateral 05/31/2015  . Knee pain 01/04/2015  . Dysphagia 08/16/2014  . Weight gain 08/05/2014  . DOE (dyspnea on exertion) 04/28/2014  . Frequent falls 12/23/2013  . Bilateral shoulder pain 04/07/2013  . Anemia in chronic illness 08/11/2012  . Status post trachelectomy 08/04/2012  . Chronic pain 01/20/2012  . History of head and neck cancer 09/26/2011  . Hx of radiation therapy   . Tracheitis 07/17/2011  . Seizure ([place]) 07/17/2011  . Hypothyroidism 03/19/2011  . Essential hypertension 03/19/2011  . 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

## 2020-01-25 NOTE — Patient Instructions (Signed)
Access Code: [email or web address] URL: [email or web address] Date: 01/25/2020 Prepared by: Hilda Blades  Exercises Supine Quadriceps Stretch with Strap on Table - 2 x daily - 7 x weekly - 2 reps - 30 seconds hold Prone Quadriceps Stretch with Strap - 2 x daily - 7 x weekly - 2 reps - 30 seconds hold Seated Hamstring Stretch - 2 x daily - 7 x weekly - 2 reps - 30 seconds hold Standing Gastroc Stretch at Counter - 2 x daily - 7 x weekly - 2 reps - 30 seconds hold Active Straight Leg Raise with Quad Set - 1 x daily - 7 x weekly - 10 reps - 2 sets Bridge - 1 x daily - 7 x weekly - 2 sets - 10 reps Sidelying Hip Abduction - 1 x daily - 7 x weekly - 2 sets - 10 reps Seated Knee Extension with Resistance - 1 x daily - 7 x weekly - 2 sets - 15 reps Standing Hamstring Curl with Resistance - 1 x daily - 7 x weekly - 2 sets - 15 reps Heel rises with counter support - 1 x daily - 7 x weekly - 2 sets - 20 reps Squat with Chair Touch - 1 x daily - 7 x weekly - 2 sets - 10 reps Step Up - 1 x daily - 7 x weekly - 2 sets - 10 reps Lateral Step Up - 1 x daily - 7 x weekly - 2 sets - 10 reps Single Leg Stance with Support - 1 x daily - 7 x weekly - 4 reps - 20 seconds hold

## 2020-02-03 ENCOUNTER — Ambulatory Visit: Payer: Medicaid Other | Admitting: Physical Therapy

## 2020-02-03 ENCOUNTER — Other Ambulatory Visit: Payer: Self-pay

## 2020-02-03 ENCOUNTER — Encounter: Payer: Self-pay | Admitting: Physical Therapy

## 2020-02-03 DIAGNOSIS — G8929 Other chronic pain: Secondary | ICD-10-CM | POA: Diagnosis not present

## 2020-02-03 DIAGNOSIS — R600 Localized edema: Secondary | ICD-10-CM | POA: Diagnosis not present

## 2020-02-03 DIAGNOSIS — M25661 Stiffness of right knee, not elsewhere classified: Secondary | ICD-10-CM

## 2020-02-03 DIAGNOSIS — R2689 Other abnormalities of gait and mobility: Secondary | ICD-10-CM | POA: Diagnosis not present

## 2020-02-03 DIAGNOSIS — M25561 Pain in right knee: Secondary | ICD-10-CM | POA: Diagnosis not present

## 2020-02-03 DIAGNOSIS — M6281 Muscle weakness (generalized): Secondary | ICD-10-CM | POA: Diagnosis not present

## 2020-02-03 NOTE — Therapy (Signed)
[place], [place], [address] Phone: [phone]   Fax:  [phone]  Physical Therapy Treatment  Patient Details  Name: Deborah [name] MRN: [id number] Date of Birth: April 21, 1956 Referring Provider (PT): Aundra Dubin, [place]   Encounter Date: 02/03/2020   PT End of Session - 02/03/20 0900    Visit Number 9    Number of Visits 17    Date for PT Re-Evaluation 02/22/20    Authorization Type [employer]    Authorization Time Period 01/04/2020 - 02/29/2020    Authorization - Visit Number 8    Authorization - Number of Visits 16    PT Start Time 0900    PT Stop Time 0945    PT Time Calculation (min) 45 min    Activity Tolerance Patient tolerated treatment well    Behavior During Therapy [facility] for tasks assessed/performed           Past Medical History:  Diagnosis Date  . Anemia    h/o of  . Arthritis    in knees  . Asthma   . Breast cancer ([place])   . Bronchitis   . COPD (chronic obstructive pulmonary disease) ([place])   . Diverticulosis 11/15/2012    noted on screening colonoscopy   . Dyspnea    with COPD exerbation  . Esophageal stricture   . Former smoker 03/19/2011  . GERD (gastroesophageal reflux disease)   . Heart murmur    asymptomatic   . History of laryngectomy   . History of radiation therapy 11/15/18- 12/06/18   Right Breast total dose 42.56 Gy in 16 fractions.   [name] Hx of radiation therapy 09/03/10 to 10/16/2010   supraglottic larynx  . Hypertension   . 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 Internal hemorrhoid 11/15/2012    small, noted on screening colonoscopy   . 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  . Leukocytopenia   . Nausea alone 07/28/2013  . Neck pain 01/21/2012  . Normal MRI 07/14/11   negative for mestasis   . Pneumonia 2012  . Sciatica   . Seizures ([place])    07/24/11 off Effexor w/o seizure  . Sepsis ([place]) 08/04/12  .  Sinusitis, chronic 07/20/2011   Bilateral maxillary, identified on MRI of head 07/14/11.    . Tracheostomy dependent [facility])     Past Surgical History:  Procedure Laterality Date  . BREAST LUMPECTOMY Right 07/06/2018  . BREAST LUMPECTOMY WITH RADIOACTIVE SEED LOCALIZATION Right 07/06/2018   Procedure: RIGHT BREAST LUMPECTOMY WITH RADIOACTIVE SEED LOCALIZATION;  Surgeon: Rolm Bookbinder, MD;  Location: [place];  Service: General;  Laterality: Right;  . COLONOSCOPY N/A 11/15/2012   Procedure: COLONOSCOPY;  Surgeon: Lafayette Dragon, MD;  Location[facility];  Service: Endoscopy;  Laterality: N/A;  . DENTAL RESTORATION/EXTRACTION WITH X-RAY    . ESOPHAGEAL DILATION N/A 09/09/2019   Procedure: ESOPHAGEAL DILATION;  Surgeon: [name], MD;  Location: [place];  Service: ENT;  Laterality: N/A;  . ESOPHAGEAL DILATION N/A 10/05/2019   Procedure: ESOPHAGEAL DILATION;  Surgeon: [name], MD;  Location: [place];  Service: ENT;  Laterality: N/A;  via tracheostomy  . ESOPHAGOGASTRODUODENOSCOPY (EGD) WITH PROPOFOL N/A 08/12/2018   Procedure: ESOPHAGOGASTRODUODENOSCOPY (EGD) WITH PROPOFOL;  Surgeon: Doran Stabler, MD;  Location: [place];  Service: Gastroenterology;  Laterality: N/A;  . ESOPHAGOSCOPY  06/21/2012   Procedure: ESOPHAGOSCOPY;  Surgeon: [name],  MD;  Location: [place];  Service: ENT;  Laterality: N/A;  . ESOPHAGOSCOPY WITH DILITATION N/A 09/21/2014   Procedure: ESOPHAGOSCOPY WITH DILITATION;  Surgeon: [name], MD;  Location: [place];  Service: ENT;  Laterality: N/A;  . ESOPHAGOSCOPY WITH DILITATION N/A 07/04/2016   Procedure: ESOPHAGOSCOPY WITH DILITATION;  Surgeon: [name], MD;  Location: [place];  Service: ENT;  Laterality: N/A;  . ESOPHAGOSCOPY WITH DILITATION N/A 12/01/2017   Procedure: ESOPHAGOSCOPY WITH DILITATION;  Surgeon: [name], MD;  Location: [place];  Service: ENT;  Laterality: N/A;  . ESOPHAGOSCOPY WITH DILITATION  N/A 04/19/2018   Procedure: ESOPHAGOSCOPY WITH DILITATION;  Surgeon: [name], MD;  Location: [place];  Service: ENT;  Laterality: N/A;  . ESOPHAGOSCOPY WITH DILITATION N/A 03/07/2019   Procedure: Esophagoscopy with dilatation;  Surgeon: [name], MD;  Location: [place];  Service: ENT;  Laterality: N/A;  . FLEXIBLE BRONCHOSCOPY  01/08/2018      . FOREIGN BODY REMOVAL BRONCHIAL  10/02/2011   Procedure: REMOVAL FOREIGN BODY BRONCHIAL;  Surgeon: Ruby Cola, MD;  Location: [place];  Service: ENT;  Laterality: N/A;  . FOREIGN BODY REMOVAL BRONCHIAL N/A 01/08/2018   Procedure: REMOVAL FOREIGN BODY BRONCHIAL;  Surgeon: Jodi Marble, MD;  Location: [facility];  Service: ENT;  Laterality: N/A;  . LARYNGECTOMY    . Porta cath removal    . PORTACATH PLACEMENT  09/17/10   Tip in cavoatrial junction  . RE-EXCISION OF BREAST CANCER,SUPERIOR MARGINS Right 07/29/2018   Procedure: RE-EXCISION OF RIGHT BREAST MEDIAL MARGINS;  Surgeon: Rolm Bookbinder, MD;  Location: [place];  Service: General;  Laterality: Right;  . RIGID ESOPHAGOSCOPY N/A 03/19/2019   Procedure: FLEXIBLE ESOPHAGOSCOPY;  Surgeon: Jodi Marble, MD;  Location: [place];  Service: ENT;  Laterality: N/A;  . STOMAPLASTY N/A 10/21/2016   Procedure: Zola Button;  Surgeon: [name], MD;  Location: [place];  Service: ENT;  Laterality: N/A;  . TOTAL KNEE ARTHROPLASTY Right 12/12/2019   Procedure: RIGHT TOTAL KNEE ARTHROPLASTY;  Surgeon: Leandrew Koyanagi, MD;  Location: [place];  Service: Orthopedics;  Laterality: Right;  . TRACHEAL DILITATION  07/16/2011   Procedure: TRACHEAL DILITATION;  Surgeon: Beckie Salts, MD;  Location: [facility];  Service: ENT;  Laterality: N/A;  dilation of tracheal stoma and replacement of stoma tube  . TUBAL LIGATION  1982    There were no vitals filed for this visit.   Subjective Assessment - 02/03/20 0859    Subjective Patient reports she has been doing good since last visit.    Patient Stated Goals Get rid of  the pain and back to walking    Currently in Pain? No/denies    Pain Score 0-No pain    Pain Location Knee    Pain Orientation Right    Pain Descriptors / Indicators Tightness;Sore    Pain Type Surgical pain    Pain Onset More than a month ago    Pain Frequency Intermittent              [facility] PT Assessment - 02/03/20 0001      Assessment   Medical Diagnosis Right TKA    Referring Provider (PT) Aundra Dubin, PA-C    Onset Date/Surgical Date 12/12/19      AROM   Right Knee Extension -3   lacking   Right Knee Flexion 125  [place] Adult PT Treatment/Exercise - 02/03/20 0001      Neuro Re-ed    Neuro Re-ed Details  SLS 5 x 20 sec each      Knee/Hip Exercises: Stretches   Passive Hamstring Stretch 3 reps;20 seconds    Passive Hamstring Stretch Limitations seated edge of chair    Knee: Self-Stretch Limitations Seated knee to chest flexion stretch 3x20 sec    Gastroc Stretch 3 reps;20 seconds    Gastroc Stretch Limitations slant board      Knee/Hip Exercises: Aerobic   Recumbent Bike L2 x 5 min      Knee/Hip Exercises: Machines for Strengthening   Cybex Knee Extension 25# 2x10    Cybex Knee Flexion 25# 2x10    Cybex Leg Press 85# 3x10      Knee/Hip Exercises: Standing   Heel Raises 2 sets;20 reps    Lateral Step Up 2 sets;10 reps    Lateral Step Up Limitations 8" step-height    Forward Step Up 2 sets;10 reps    Forward Step Up Limitations 8" step-height                  PT Education - 02/03/20 0900    Education Details HEP    Person(s) Educated Patient    Methods Explanation;Demonstration;Verbal cues    Comprehension Verbalized understanding;Returned demonstration;Verbal cues required;Need further instruction            PT Short Term Goals - 01/16/20 0833      PT SHORT TERM GOAL #1   Title Patient will be I with initial HEP to progress with PT    Baseline Patient continues to require cueing for technique -  01/16/2020    Time 3    Period Weeks    Status On-going    Target Date 01/18/20      PT SHORT TERM GOAL #2   Title Patient will exhibit improved AROM knee flexion to >/= 110 deg and knee extension to </= 5 deg lacking from neutral to improve gait and transfers    Baseline Flexion: 120, Extension: 4 lacking - 01/12/2020    Time 3    Period Weeks    Status Achieved    Target Date 01/18/20      PT SHORT TERM GOAL #3   Title Patient will report </= 6/10 pain level with activity to improve functional ability    Baseline 4/10 pain level - 01/16/2020    Time 3    Period Weeks    Status Achieved    Target Date 01/18/20             PT Long Term Goals - 12/28/19 1334      PT LONG TERM GOAL #1   Title Patient will be I with final HEP to maintain progress from PT    Time 8    Period Weeks    Status New    Target Date 02/22/20      PT LONG TERM GOAL #2   Title Patient will demonstrated right knee AROM WFL to all for normalalized transfers and gait    Time 8    Period Weeks    Status New    Target Date 02/22/20      PT LONG TERM GOAL #3   Title Patient will exhibit right knee strength grossly 5/5 to improve stair negotiation and activity tolerance    Time 8    Period Weeks    Status New    Target Date  02/22/20      PT LONG TERM GOAL #4   Title Patient will report no limitation with walking or standing    Time 8    Period Weeks    Status New    Target Date 02/22/20                 Plan - 02/03/20 0900    Clinical Impression Statement Patient tolerated therapy well with no adverse effects. She is progressing very well with her strengthening and is continuing to improve with her balance exercises. Her active motion demonstrates improvement and is gradually increasing her walking in community. Patient did not report any increase in pain this visit and no change was made to HEP. She would benefit from continued skilled PT to progress motion and strength to improve walking  ability and maximize functional level.    PT Treatment/Interventions ADLs/Self Care Home Management;Cryotherapy;Electrical Stimulation;Moist Heat;Neuromuscular re-education;Balance training;Therapeutic exercise;Therapeutic activities;Functional mobility training;Stair training;Gait training;Patient/family education;Manual techniques;Dry needling;Passive range of motion;Taping;Vasopneumatic Device;Joint Manipulations    PT Next Visit Plan Assess HEP and progress PRN, bike, manual and stretching for improving knee motion, progress strength as tolerated, gait and balance training, *cannot bill for vaso    PT Home Exercise Plan [id number]: heel prop, quad set, standing calf stretch with strap, supine heel slide with strap, seated hamstring stretch, seated knee AROM; SLR, hip abduction, hip extension, bridge, banded LAQ and standing hamstring curl, heel raises, sit<>stand, SLS    Consulted and Agree with Plan of Care Patient           Patient will benefit from skilled therapeutic intervention in order to improve the following deficits and impairments:  Abnormal gait, Decreased range of motion, Difficulty walking, Pain, Decreased activity tolerance, Decreased balance, Decreased strength, Improper body mechanics, Impaired flexibility  Visit Diagnosis: Chronic pain of right knee  Stiffness of right knee, not elsewhere classified  Muscle weakness (generalized)  Other abnormalities of gait and mobility  Localized edema     Problem List Patient Active Problem List   Diagnosis Date Noted  . Status post total right knee replacement 12/12/2019  . Primary osteoarthritis of right knee 12/11/2019  . Epidermal cyst of neck 07/21/2019  . Localized swelling, mass and lump, neck 07/20/2019  . Allergic rhinitis 05/20/2019  . GERD (gastroesophageal reflux disease)   . COPD (chronic obstructive pulmonary disease) ([place])   . Chronic diastolic (congestive) heart failure ([place])   . Foreign body aspiration   .  Breast cancer ([place]) 07/06/2018  . 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[place]) 04/08/2018  . Carpal tunnel syndrome 03/16/2018  . Obesity 08/24/2017  . Plantar fasciitis 06/06/2016  . Low back pain 05/21/2016  . Health care maintenance 12/29/2015  . Osteoarthritis of knees, bilateral 05/31/2015  . Knee pain 01/04/2015  . Dysphagia 08/16/2014  . Weight gain 08/05/2014  . DOE (dyspnea on exertion) 04/28/2014  . Frequent falls 12/23/2013  . Bilateral shoulder pain 04/07/2013  . Anemia in chronic illness 08/11/2012  . Status post trachelectomy 08/04/2012  . Chronic pain 01/20/2012  . History of head and neck cancer 09/26/2011  . Hx of radiation therapy   . Tracheitis 07/17/2011  . Seizure ([place]) 07/17/2011  . Hypothyroidism 03/19/2011  . Essential hypertension 03/19/2011  . 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

## 2020-02-08 ENCOUNTER — Other Ambulatory Visit: Payer: Self-pay

## 2020-02-08 ENCOUNTER — Encounter: Payer: Self-pay | Admitting: Physical Therapy

## 2020-02-08 ENCOUNTER — Ambulatory Visit: Payer: Medicaid Other | Admitting: Physical Therapy

## 2020-02-08 DIAGNOSIS — M6281 Muscle weakness (generalized): Secondary | ICD-10-CM | POA: Diagnosis not present

## 2020-02-08 DIAGNOSIS — G8929 Other chronic pain: Secondary | ICD-10-CM

## 2020-02-08 DIAGNOSIS — M25661 Stiffness of right knee, not elsewhere classified: Secondary | ICD-10-CM | POA: Diagnosis not present

## 2020-02-08 DIAGNOSIS — R600 Localized edema: Secondary | ICD-10-CM

## 2020-02-08 DIAGNOSIS — R2689 Other abnormalities of gait and mobility: Secondary | ICD-10-CM | POA: Diagnosis not present

## 2020-02-08 DIAGNOSIS — M25561 Pain in right knee: Secondary | ICD-10-CM

## 2020-02-08 NOTE — Therapy (Addendum)
[place], [place], [address] Phone: [phone]   Fax:  [phone]  Physical Therapy Treatment   Progress Note Reporting Period 12/28/2019 to 02/08/2020  See note below for Objective Data and Assessment of Progress/Goals.    Patient Details  Name: Deborah [name] MRN: [id number] Date of Birth: Oct 04, 1955 Referring Provider (PT): Aundra Dubin, [place]   Encounter Date: 02/08/2020   PT End of Session - 02/08/20 0918    Visit Number 10    Number of Visits 17    Date for PT Re-Evaluation 02/22/20    Authorization Type [employer]    Authorization Time Period 01/04/2020 - 02/29/2020    Authorization - Visit Number 9    Authorization - Number of Visits 16    PT Start Time 0911    PT Stop Time 0953    PT Time Calculation (min) 42 min    Activity Tolerance Patient tolerated treatment well    Behavior During Therapy [facility] for tasks assessed/performed           Past Medical History:  Diagnosis Date  . Anemia    h/o of  . Arthritis    in knees  . Asthma   . Breast cancer ([place])   . Bronchitis   . COPD (chronic obstructive pulmonary disease) ([place])   . Diverticulosis 11/15/2012    noted on screening colonoscopy   . Dyspnea    with COPD exerbation  . Esophageal stricture   . Former smoker 03/19/2011  . GERD (gastroesophageal reflux disease)   . Heart murmur    asymptomatic   . History of laryngectomy   . History of radiation therapy 11/15/18- 12/06/18   Right Breast total dose 42.56 Gy in 16 fractions.   [name] Hx of radiation therapy 09/03/10 to 10/16/2010   supraglottic larynx  . Hypertension   . 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 Internal hemorrhoid 11/15/2012    small, noted on screening colonoscopy   . 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  . Leukocytopenia   . Nausea alone 07/28/2013  . Neck pain 01/21/2012  . Normal MRI 07/14/11    negative for mestasis   . Pneumonia 2012  . Sciatica   . Seizures ([place])    07/24/11 off Effexor w/o seizure  . Sepsis ([place]) 08/04/12  . Sinusitis, chronic 07/20/2011   Bilateral maxillary, identified on MRI of head 07/14/11.    . Tracheostomy dependent [facility])     Past Surgical History:  Procedure Laterality Date  . BREAST LUMPECTOMY Right 07/06/2018  . BREAST LUMPECTOMY WITH RADIOACTIVE SEED LOCALIZATION Right 07/06/2018   Procedure: RIGHT BREAST LUMPECTOMY WITH RADIOACTIVE SEED LOCALIZATION;  Surgeon: Rolm Bookbinder, MD;  Location: [place];  Service: General;  Laterality: Right;  . COLONOSCOPY N/A 11/15/2012   Procedure: COLONOSCOPY;  Surgeon: Lafayette Dragon, MD;  Location: [facility];  Service: Endoscopy;  Laterality: N/A;  . DENTAL RESTORATION/EXTRACTION WITH X-RAY    . ESOPHAGEAL DILATION N/A 09/09/2019   Procedure: ESOPHAGEAL DILATION;  Surgeon: [name], MD;  Location: [place];  Service: ENT;  Laterality: N/A;  . ESOPHAGEAL DILATION N/A 10/05/2019   Procedure: ESOPHAGEAL DILATION;  Surgeon: [name], MD;  Location: [place];  Service: ENT;  Laterality: N/A;  via tracheostomy  . ESOPHAGOGASTRODUODENOSCOPY (EGD) WITH PROPOFOL N/A 08/12/2018   Procedure: ESOPHAGOGASTRODUODENOSCOPY (EGD) WITH PROPOFOL;  Surgeon: Doran Stabler, MD;  [facility];  Service: Gastroenterology;  Laterality: N/A;  . ESOPHAGOSCOPY  06/21/2012   Procedure: ESOPHAGOSCOPY;  Surgeon: [name], MD;  Location: [place];  Service: ENT;  Laterality: N/A;  . ESOPHAGOSCOPY WITH DILITATION N/A 09/21/2014   Procedure: ESOPHAGOSCOPY WITH DILITATION;  Surgeon: [name], MD;  Location: [place];  Service: ENT;  Laterality: N/A;  . ESOPHAGOSCOPY WITH DILITATION N/A 07/04/2016   Procedure: ESOPHAGOSCOPY WITH DILITATION;  Surgeon: [name], MD;  Location: [place];  Service: ENT;  Laterality: N/A;  . ESOPHAGOSCOPY WITH DILITATION N/A 12/01/2017   Procedure:  ESOPHAGOSCOPY WITH DILITATION;  Surgeon: [name], MD;  Location: [place];  Service: ENT;  Laterality: N/A;  . ESOPHAGOSCOPY WITH DILITATION N/A 04/19/2018   Procedure: ESOPHAGOSCOPY WITH DILITATION;  Surgeon: [name], MD;  Location: [place];  Service: ENT;  Laterality: N/A;  . ESOPHAGOSCOPY WITH DILITATION N/A 03/07/2019   Procedure: Esophagoscopy with dilatation;  Surgeon: [name], MD;  Location: [place];  Service: ENT;  Laterality: N/A;  . FLEXIBLE BRONCHOSCOPY  01/08/2018      . FOREIGN BODY REMOVAL BRONCHIAL  10/02/2011   Procedure: REMOVAL FOREIGN BODY BRONCHIAL;  Surgeon: Ruby Cola, MD;  Location: [place];  Service: ENT;  Laterality: N/A;  . FOREIGN BODY REMOVAL BRONCHIAL N/A 01/08/2018   Procedure: REMOVAL FOREIGN BODY BRONCHIAL;  Surgeon: Jodi Marble, MD;  Location: [facility];  Service: ENT;  Laterality: N/A;  . LARYNGECTOMY    . Porta cath removal    . PORTACATH PLACEMENT  09/17/10   Tip in cavoatrial junction  . RE-EXCISION OF BREAST CANCER,SUPERIOR MARGINS Right 07/29/2018   Procedure: RE-EXCISION OF RIGHT BREAST MEDIAL MARGINS;  Surgeon: Rolm Bookbinder, MD;  Location: [place];  Service: General;  Laterality: Right;  . RIGID ESOPHAGOSCOPY N/A 03/19/2019   Procedure: FLEXIBLE ESOPHAGOSCOPY;  Surgeon: Jodi Marble, MD;  Location: [place];  Service: ENT;  Laterality: N/A;  . STOMAPLASTY N/A 10/21/2016   Procedure: Zola Button;  Surgeon: [name], MD;  Location: [place];  Service: ENT;  Laterality: N/A;  . TOTAL KNEE ARTHROPLASTY Right 12/12/2019   Procedure: RIGHT TOTAL KNEE ARTHROPLASTY;  Surgeon: Leandrew Koyanagi, MD;  Location: [place];  Service: Orthopedics;  Laterality: Right;  . TRACHEAL DILITATION  07/16/2011   Procedure: TRACHEAL DILITATION;  Surgeon: Beckie Salts, MD;  Location: [place];  Service: ENT;  Laterality: N/A;  dilation of tracheal stoma and replacement of stoma tube  . TUBAL LIGATION  1982    There were no vitals filed for this visit.    Subjective Assessment - 02/08/20 0917    Subjective Patient reports she is doing well with no new issues.    Patient Stated Goals Get rid of the pain and back to walking    Currently in Pain? No/denies              [facility] PT Assessment - 02/08/20 0001      AROM   Right Knee Extension -3   lacking   Right Knee Flexion 126      Strength   Right Knee Flexion 4+/5    Right Knee Extension 4+/5                         [facility] Adult PT Treatment/Exercise - 02/08/20 0001      Neuro Re-ed    Neuro Re-ed Details  SLS 3 x 20 sec each, Tandem on Airex 3 x 20 sec  Knee/Hip Exercises: Aerobic   Recumbent Bike L2 x 5 min      Knee/Hip Exercises: Machines for Strengthening   Cybex Knee Extension 25# x10, 30# x10    Cybex Knee Flexion 30# 2x10    Cybex Leg Press 95# 3x8      Knee/Hip Exercises: Standing   Lateral Step Up 2 sets;10 reps    Lateral Step Up Limitations 10" step height    Forward Step Up 2 sets;10 reps    Forward Step Up Limitations 10" step height    Other Standing Knee Exercises Lateral band walk with green around knees 3x15                  PT Education - 02/08/20 0918    Education Details HEP    Person(s) Educated Patient    Methods Explanation    Comprehension Verbalized understanding            PT Short Term Goals - 01/16/20 0833      PT SHORT TERM GOAL #1   Title Patient will be I with initial HEP to progress with PT    Baseline Patient continues to require cueing for technique - 01/16/2020    Time 3    Period Weeks    Status On-going    Target Date 01/18/20      PT SHORT TERM GOAL #2   Title Patient will exhibit improved AROM knee flexion to >/= 110 deg and knee extension to </= 5 deg lacking from neutral to improve gait and transfers    Baseline Flexion: 120, Extension: 4 lacking - 01/12/2020    Time 3    Period Weeks    Status Achieved    Target Date 01/18/20      PT SHORT TERM GOAL #3   Title Patient will report </=  6/10 pain level with activity to improve functional ability    Baseline 4/10 pain level - 01/16/2020    Time 3    Period Weeks    Status Achieved    Target Date 01/18/20             PT Long Term Goals - 12/28/19 1334      PT LONG TERM GOAL #1   Title Patient will be I with final HEP to maintain progress from PT    Time 8    Period Weeks    Status New    Target Date 02/22/20      PT LONG TERM GOAL #2   Title Patient will demonstrated right knee AROM WFL to all for normalalized transfers and gait    Time 8    Period Weeks    Status New    Target Date 02/22/20      PT LONG TERM GOAL #3   Title Patient will exhibit right knee strength grossly 5/5 to improve stair negotiation and activity tolerance    Time 8    Period Weeks    Status New    Target Date 02/22/20      PT LONG TERM GOAL #4   Title Patient will report no limitation with walking or standing    Time 8    Period Weeks    Status New    Target Date 02/22/20                 Plan - 02/08/20 0919    Clinical Impression Statement Patient tolerated therapy well with no adverse effects. She is progressing very well with  her strengthening exercises and balanace training. She exhibits improved right knee strength and single leg balance. She would benefit from continued skilled PT to progress motion and strength to improve walking ability and maximize functional level.    PT Treatment/Interventions ADLs/Self Care Home Management;Cryotherapy;Electrical Stimulation;Moist Heat;Neuromuscular re-education;Balance training;Therapeutic exercise;Therapeutic activities;Functional mobility training;Stair training;Gait training;Patient/family education;Manual techniques;Dry needling;Passive range of motion;Taping;Vasopneumatic Device;Joint Manipulations    PT Next Visit Plan Assess HEP and progress PRN, bike, manual and stretching for improving knee motion, progress strength as tolerated, gait and balance training, *cannot bill for  vaso    PT Home Exercise Plan [id number]: heel prop, standing calf stretch with strap, supine heel slide with strap, seated hamstring stretch, seated knee AROM; SLR, hip abduction, hip extension, bridge, banded LAQ and standing hamstring curl, heel raises, sit<>stand, SLS    Consulted and Agree with Plan of Care Patient           Patient will benefit from skilled therapeutic intervention in order to improve the following deficits and impairments:  Abnormal gait, Decreased range of motion, Difficulty walking, Pain, Decreased activity tolerance, Decreased balance, Decreased strength, Improper body mechanics, Impaired flexibility  Visit Diagnosis: Chronic pain of right knee  Stiffness of right knee, not elsewhere classified  Muscle weakness (generalized)  Other abnormalities of gait and mobility  Localized edema     Problem List Patient Active Problem List   Diagnosis Date Noted  . Status post total right knee replacement 12/12/2019  . Primary osteoarthritis of right knee 12/11/2019  . Epidermal cyst of neck 07/21/2019  . Localized swelling, mass and lump, neck 07/20/2019  . Allergic rhinitis 05/20/2019  . GERD (gastroesophageal reflux disease)   . COPD (chronic obstructive pulmonary disease) ([place])   . Chronic diastolic (congestive) heart failure ([place])   . Foreign body aspiration   . Breast cancer ([place]) 07/06/2018  . 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[place]) 04/08/2018  . Carpal tunnel syndrome 03/16/2018  . Obesity 08/24/2017  . Plantar fasciitis 06/06/2016  . Low back pain 05/21/2016  . Health care maintenance 12/29/2015  . Osteoarthritis of knees, bilateral 05/31/2015  . Knee pain 01/04/2015  . Dysphagia 08/16/2014  . Weight gain 08/05/2014  . DOE (dyspnea on exertion) 04/28/2014  . Frequent falls 12/23/2013  . Bilateral shoulder pain 04/07/2013  . Anemia in chronic illness 08/11/2012  . Status  post trachelectomy 08/04/2012  . Chronic pain 01/20/2012  . History of head and neck cancer 09/26/2011  . Hx of radiation therapy   . Tracheitis 07/17/2011  . Seizure ([place]) 07/17/2011  . Hypothyroidism 03/19/2011  . Essential hypertension 03/19/2011  . 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

## 2020-02-13 ENCOUNTER — Other Ambulatory Visit: Payer: Self-pay | Admitting: Orthopaedic Surgery

## 2020-02-13 ENCOUNTER — Other Ambulatory Visit: Payer: Self-pay | Admitting: Family Medicine

## 2020-02-13 ENCOUNTER — Other Ambulatory Visit: Payer: Self-pay | Admitting: Physician Assistant

## 2020-02-13 DIAGNOSIS — E039 Hypothyroidism, unspecified: Secondary | ICD-10-CM

## 2020-02-13 DIAGNOSIS — J302 Other seasonal allergic rhinitis: Secondary | ICD-10-CM

## 2020-02-14 ENCOUNTER — Telehealth: Payer: Self-pay | Admitting: *Deleted

## 2020-02-14 NOTE — Telephone Encounter (Signed)
Received fax requesting a new script for ProAir which is covered by pt insurance.  Rx sent was for Proventil. Janice Bodine Zimmerman Rumple, CMA

## 2020-02-15 ENCOUNTER — Ambulatory Visit: Payer: Medicaid Other | Attending: Physician Assistant | Admitting: Physical Therapy

## 2020-02-15 ENCOUNTER — Other Ambulatory Visit: Payer: Self-pay | Admitting: Family Medicine

## 2020-02-15 ENCOUNTER — Encounter: Payer: Self-pay | Admitting: Physical Therapy

## 2020-02-15 ENCOUNTER — Other Ambulatory Visit: Payer: Self-pay

## 2020-02-15 DIAGNOSIS — R2689 Other abnormalities of gait and mobility: Secondary | ICD-10-CM

## 2020-02-15 DIAGNOSIS — G8929 Other chronic pain: Secondary | ICD-10-CM | POA: Insufficient documentation

## 2020-02-15 DIAGNOSIS — R600 Localized edema: Secondary | ICD-10-CM | POA: Diagnosis not present

## 2020-02-15 DIAGNOSIS — M25661 Stiffness of right knee, not elsewhere classified: Secondary | ICD-10-CM | POA: Diagnosis not present

## 2020-02-15 DIAGNOSIS — M6281 Muscle weakness (generalized): Secondary | ICD-10-CM

## 2020-02-15 DIAGNOSIS — M25561 Pain in right knee: Secondary | ICD-10-CM | POA: Insufficient documentation

## 2020-02-15 MED ORDER — ALBUTEROL SULFATE HFA 108 (90 BASE) MCG/ACT IN AERS: 2.0000 | INHALATION_SPRAY | RESPIRATORY_TRACT | 11 refills | Status: DC | PRN

## 2020-02-15 NOTE — Therapy (Signed)
[place], [place], [address] Phone: [phone]   Fax:  [phone]  Physical Therapy Treatment  Patient Details  Name: Deborah [name] MRN: [id number] Date of Birth: March 21, 1956 Referring Provider (PT): Aundra Dubin, [place]   Encounter Date: 02/15/2020   PT End of Session - 02/15/20 0926    Visit Number 11    Number of Visits 17    Date for PT Re-Evaluation 02/22/20    Authorization Type [employer]    Authorization Time Period 01/04/2020 - 02/29/2020    Authorization - Visit Number 10    Authorization - Number of Visits 16    Progress Note Due on Visit 20    PT Start Time 0916    PT Stop Time 0958    PT Time Calculation (min) 42 min    Activity Tolerance Patient tolerated treatment well    Behavior During Therapy [facility] for tasks assessed/performed           Past Medical History:  Diagnosis Date  . Anemia    h/o of  . Arthritis    in knees  . Asthma   . Breast cancer ([place])   . Bronchitis   . COPD (chronic obstructive pulmonary disease) ([place])   . Diverticulosis 11/15/2012    noted on screening colonoscopy   . Dyspnea    with COPD exerbation  . Esophageal stricture   . Former smoker 03/19/2011  . GERD (gastroesophageal reflux disease)   . Heart murmur    asymptomatic   . History of laryngectomy   . History of radiation therapy 11/15/18- 12/06/18   Right Breast total dose 42.56 Gy in 16 fractions.   [name] Hx of radiation therapy 09/03/10 to 10/16/2010   supraglottic larynx  . Hypertension   . 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 Internal hemorrhoid 11/15/2012    small, noted on screening colonoscopy   . 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  . Leukocytopenia   . Nausea alone 07/28/2013  . Neck pain 01/21/2012  . Normal MRI 07/14/11   negative for mestasis   . Pneumonia 2012  . Sciatica   . Seizures ([place])    07/24/11 off Effexor w/o  seizure  . Sepsis ([place]) 08/04/12  . Sinusitis, chronic 07/20/2011   Bilateral maxillary, identified on MRI of head 07/14/11.    . Tracheostomy dependent [facility])     Past Surgical History:  Procedure Laterality Date  . BREAST LUMPECTOMY Right 07/06/2018  . BREAST LUMPECTOMY WITH RADIOACTIVE SEED LOCALIZATION Right 07/06/2018   Procedure: RIGHT BREAST LUMPECTOMY WITH RADIOACTIVE SEED LOCALIZATION;  Surgeon: Rolm Bookbinder, MD;  Location: [place];  Service: General;  Laterality: Right;  . COLONOSCOPY N/A 11/15/2012   Procedure: COLONOSCOPY;  Surgeon: Lafayette Dragon, MD;  Location: [facility];  Service: Endoscopy;  Laterality: N/A;  . DENTAL RESTORATION/EXTRACTION WITH X-RAY    . ESOPHAGEAL DILATION N/A 09/09/2019   Procedure: ESOPHAGEAL DILATION;  Surgeon: [name], MD;  Location: [place];  Service: ENT;  Laterality: N/A;  . ESOPHAGEAL DILATION N/A 10/05/2019   Procedure: ESOPHAGEAL DILATION;  Surgeon: [name], MD;  Location: [place];  Service: ENT;  Laterality: N/A;  via tracheostomy  . ESOPHAGOGASTRODUODENOSCOPY (EGD) WITH PROPOFOL N/A 08/12/2018   Procedure: ESOPHAGOGASTRODUODENOSCOPY (EGD) WITH PROPOFOL;  Surgeon: Doran Stabler, MD;  Location: [place];  Service: Gastroenterology;  Laterality: N/A;  . ESOPHAGOSCOPY  06/21/2012   Procedure: ESOPHAGOSCOPY;  Surgeon: [name], MD;  Location: [place];  Service: ENT;  Laterality: N/A;  . ESOPHAGOSCOPY WITH DILITATION N/A 09/21/2014   Procedure: ESOPHAGOSCOPY WITH DILITATION;  Surgeon: [name], MD;  Location: [place];  Service: ENT;  Laterality: N/A;  . ESOPHAGOSCOPY WITH DILITATION N/A 07/04/2016   Procedure: ESOPHAGOSCOPY WITH DILITATION;  Surgeon: [name], MD;  Location: [place];  Service: ENT;  Laterality: N/A;  . ESOPHAGOSCOPY WITH DILITATION N/A 12/01/2017   Procedure: ESOPHAGOSCOPY WITH DILITATION;  Surgeon: [name], MD;  Location: [place];  Service: ENT;  Laterality:  N/A;  . ESOPHAGOSCOPY WITH DILITATION N/A 04/19/2018   Procedure: ESOPHAGOSCOPY WITH DILITATION;  Surgeon: [name], MD;  Location: [place];  Service: ENT;  Laterality: N/A;  . ESOPHAGOSCOPY WITH DILITATION N/A 03/07/2019   Procedure: Esophagoscopy with dilatation;  Surgeon: [name], MD;  Location: [place];  Service: ENT;  Laterality: N/A;  . FLEXIBLE BRONCHOSCOPY  01/08/2018      . FOREIGN BODY REMOVAL BRONCHIAL  10/02/2011   Procedure: REMOVAL FOREIGN BODY BRONCHIAL;  Surgeon: Ruby Cola, MD;  Location: [place];  Service: ENT;  Laterality: N/A;  . FOREIGN BODY REMOVAL BRONCHIAL N/A 01/08/2018   Procedure: REMOVAL FOREIGN BODY BRONCHIAL;  Surgeon: Jodi Marble, MD;  Location: [facility];  Service: ENT;  Laterality: N/A;  . LARYNGECTOMY    . Porta cath removal    . PORTACATH PLACEMENT  09/17/10   Tip in cavoatrial junction  . RE-EXCISION OF BREAST CANCER,SUPERIOR MARGINS Right 07/29/2018   Procedure: RE-EXCISION OF RIGHT BREAST MEDIAL MARGINS;  Surgeon: Rolm Bookbinder, MD;  Location: [place];  Service: General;  Laterality: Right;  . RIGID ESOPHAGOSCOPY N/A 03/19/2019   Procedure: FLEXIBLE ESOPHAGOSCOPY;  Surgeon: Jodi Marble, MD;  Location: [place];  Service: ENT;  Laterality: N/A;  . STOMAPLASTY N/A 10/21/2016   Procedure: Zola Button;  Surgeon: [name], MD;  Location: [place];  Service: ENT;  Laterality: N/A;  . TOTAL KNEE ARTHROPLASTY Right 12/12/2019   Procedure: RIGHT TOTAL KNEE ARTHROPLASTY;  Surgeon: Leandrew Koyanagi, MD;  Location: [place];  Service: Orthopedics;  Laterality: Right;  . TRACHEAL DILITATION  07/16/2011   Procedure: TRACHEAL DILITATION;  Surgeon: Beckie Salts, MD;  Location: [place];  Service: ENT;  Laterality: N/A;  dilation of tracheal stoma and replacement of stoma tube  . TUBAL LIGATION  1982    There were no vitals filed for this visit.   Subjective Assessment - 02/15/20 0924    Subjective Patient reports she woke up a little stiff this morning  but no other issues.    Patient Stated Goals Get rid of the pain and back to walking    Currently in Pain? No/denies              [facility] PT Assessment - 02/15/20 0001      AROM   Right Knee Flexion 128                         [facility] Adult PT Treatment/Exercise - 02/15/20 0001      Neuro Re-ed    Neuro Re-ed Details  SLS 3 x 30 sec each, Tandem on Airex 3 x 20 sec      Knee/Hip Exercises: Stretches   Gastroc Stretch 2 reps;30 seconds    Gastroc Stretch Limitations slant board      Knee/Hip Exercises: Aerobic   Recumbent Bike L2 x  4 min      Knee/Hip Exercises: Machines for Strengthening   Cybex Knee Extension 25# 2x10    Cybex Knee Flexion 35# 2x10    Cybex Leg Press 95# 3x8      Knee/Hip Exercises: Standing   Lateral Step Up 2 sets;10 reps    Lateral Step Up Limitations 10" step height    Forward Step Up 2 sets;10 reps    Forward Step Up Limitations 10" step height    Other Standing Knee Exercises Lateral band walk with green around knees 3x15    Other Standing Knee Exercises Dead lift with 30# 2x8   significant cueing for proper technique                 PT Education - 02/15/20 0925    Education Details HEP    Person(s) Educated Patient    Methods Explanation    Comprehension Verbalized understanding;Need further instruction            PT Short Term Goals - 01/16/20 0833      PT SHORT TERM GOAL #1   Title Patient will be I with initial HEP to progress with PT    Baseline Patient continues to require cueing for technique - 01/16/2020    Time 3    Period Weeks    Status On-going    Target Date 01/18/20      PT SHORT TERM GOAL #2   Title Patient will exhibit improved AROM knee flexion to >/= 110 deg and knee extension to </= 5 deg lacking from neutral to improve gait and transfers    Baseline Flexion: 120, Extension: 4 lacking - 01/12/2020    Time 3    Period Weeks    Status Achieved    Target Date 01/18/20      PT SHORT TERM GOAL  #3   Title Patient will report </= 6/10 pain level with activity to improve functional ability    Baseline 4/10 pain level - 01/16/2020    Time 3    Period Weeks    Status Achieved    Target Date 01/18/20             PT Long Term Goals - 12/28/19 1334      PT LONG TERM GOAL #1   Title Patient will be I with final HEP to maintain progress from PT    Time 8    Period Weeks    Status New    Target Date 02/22/20      PT LONG TERM GOAL #2   Title Patient will demonstrated right knee AROM WFL to all for normalalized transfers and gait    Time 8    Period Weeks    Status New    Target Date 02/22/20      PT LONG TERM GOAL #3   Title Patient will exhibit right knee strength grossly 5/5 to improve stair negotiation and activity tolerance    Time 8    Period Weeks    Status New    Target Date 02/22/20      PT LONG TERM GOAL #4   Title Patient will report no limitation with walking or standing    Time 8    Period Weeks    Status New    Target Date 02/22/20                 Plan - 02/15/20 0926    Clinical Impression Statement Patient tolerated therapy well with  no adverse effects. She continues to exhibit good ROM and is progress well with her strengthening exercises. Balance continues to improve. She notes persistent morning stiffness so was instructed to stretch and move her knee in the morning. She would benefit from continued skilled PT to progress motion and strength to improve walking ability and maximize functional level.    PT Treatment/Interventions ADLs/Self Care Home Management;Cryotherapy;Electrical Stimulation;Moist Heat;Neuromuscular re-education;Balance training;Therapeutic exercise;Therapeutic activities;Functional mobility training;Stair training;Gait training;Patient/family education;Manual techniques;Dry needling;Passive range of motion;Taping;Vasopneumatic Device;Joint Manipulations    PT Next Visit Plan Assess HEP and progress PRN, bike, manual and  stretching for improving knee motion, progress strength as tolerated, gait and balance training, *cannot bill for vaso    PT Home Exercise Plan [id number]: heel prop, standing calf stretch with strap, supine heel slide with strap, seated hamstring stretch, seated knee AROM; SLR, hip abduction, hip extension, bridge, banded LAQ and standing hamstring curl, heel raises, sit<>stand, SLS    Consulted and Agree with Plan of Care Patient           Patient will benefit from skilled therapeutic intervention in order to improve the following deficits and impairments:  Abnormal gait, Decreased range of motion, Difficulty walking, Pain, Decreased activity tolerance, Decreased balance, Decreased strength, Improper body mechanics, Impaired flexibility  Visit Diagnosis: Chronic pain of right knee  Stiffness of right knee, not elsewhere classified  Muscle weakness (generalized)  Other abnormalities of gait and mobility  Localized edema     Problem List Patient Active Problem List   Diagnosis Date Noted  . Status post total right knee replacement 12/12/2019  . Primary osteoarthritis of right knee 12/11/2019  . Epidermal cyst of neck 07/21/2019  . Localized swelling, mass and lump, neck 07/20/2019  . Allergic rhinitis 05/20/2019  . GERD (gastroesophageal reflux disease)   . COPD (chronic obstructive pulmonary disease) ([place])   . Chronic diastolic (congestive) heart failure ([place])   . Foreign body aspiration   . Breast cancer ([place]) 07/06/2018  . 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[place]) 04/08/2018  . Carpal tunnel syndrome 03/16/2018  . Obesity 08/24/2017  . Plantar fasciitis 06/06/2016  . Low back pain 05/21/2016  . Health care maintenance 12/29/2015  . Osteoarthritis of knees, bilateral 05/31/2015  . Knee pain 01/04/2015  . Dysphagia 08/16/2014  . Weight gain 08/05/2014  . DOE (dyspnea on exertion) 04/28/2014  .  Frequent falls 12/23/2013  . Bilateral shoulder pain 04/07/2013  . Anemia in chronic illness 08/11/2012  . Status post trachelectomy 08/04/2012  . Chronic pain 01/20/2012  . History of head and neck cancer 09/26/2011  . Hx of radiation therapy   . Tracheitis 07/17/2011  . Seizure ([place]) 07/17/2011  . Hypothyroidism 03/19/2011  . Essential hypertension 03/19/2011  . 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 [place], [place], [address] Phone: [phone]   Fax:  [phone]  Name: Deborah [name] MRN: [id number] Date of Birth: 1955-07-20

## 2020-02-15 NOTE — Telephone Encounter (Signed)
New script sent in for proair

## 2020-02-22 ENCOUNTER — Encounter: Payer: Self-pay | Admitting: Physical Therapy

## 2020-02-22 ENCOUNTER — Other Ambulatory Visit: Payer: Self-pay

## 2020-02-22 ENCOUNTER — Encounter (HOSPITAL_BASED_OUTPATIENT_CLINIC_OR_DEPARTMENT_OTHER): Payer: Self-pay | Admitting: Otolaryngology

## 2020-02-22 ENCOUNTER — Ambulatory Visit: Payer: Medicaid Other | Admitting: Physical Therapy

## 2020-02-22 DIAGNOSIS — G8929 Other chronic pain: Secondary | ICD-10-CM

## 2020-02-22 DIAGNOSIS — M25661 Stiffness of right knee, not elsewhere classified: Secondary | ICD-10-CM

## 2020-02-22 DIAGNOSIS — R2689 Other abnormalities of gait and mobility: Secondary | ICD-10-CM | POA: Diagnosis not present

## 2020-02-22 DIAGNOSIS — M6281 Muscle weakness (generalized): Secondary | ICD-10-CM

## 2020-02-22 DIAGNOSIS — M25561 Pain in right knee: Secondary | ICD-10-CM | POA: Diagnosis not present

## 2020-02-22 DIAGNOSIS — R600 Localized edema: Secondary | ICD-10-CM | POA: Diagnosis not present

## 2020-02-22 NOTE — Therapy (Signed)
[place], [place], [address] Phone: [phone]   Fax:  [phone]  Physical Therapy Treatment  Patient Details  Name: Deborah [name] MRN: [id number] Date of Birth: [birth year] Referring Provider (PT): Aundra Dubin, [place]   Encounter Date: 02/22/2020   PT End of Session - 02/22/20 0827    Visit Number 12    Number of Visits 13    Date for PT Re-Evaluation 02/29/20    Authorization Type [employer] HE[employer] BLUE    Authorization Time Period 01/04/2020 - 02/29/2020    Authorization - Visit Number 11    Authorization - Number of Visits 16    Progress Note Due on Visit 20    PT Start Time 0822    PT Stop Time 0905    PT Time Calculation (min) 43 min    Activity Tolerance Patient tolerated treatment well    Behavior During Therapy [facility] for tasks assessed/performed           Past Medical History:  Diagnosis Date  . Anemia    h/o of  . Arthritis    in knees  . Asthma   . Breast cancer ([place])   . Bronchitis   . COPD (chronic obstructive pulmonary disease) ([place])   . Diverticulosis 11/15/2012    noted on screening colonoscopy   . Dyspnea    with COPD exerbation  . Esophageal stricture   . Former smoker 03/19/2011  . GERD (gastroesophageal reflux disease)   . Heart murmur    asymptomatic   . History of laryngectomy   . History of radiation therapy 11/15/18- 12/06/18   Right Breast total dose 42.56 Gy in 16 fractions.   [name] Hx of radiation therapy 09/03/10 to 10/16/2010   supraglottic larynx  . Hypertension   . 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 Internal hemorrhoid 11/15/2012    small, noted on screening colonoscopy   . 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  . Leukocytopenia   . Nausea alone 07/28/2013  . Neck pain 01/21/2012  . Normal MRI 07/14/11   negative for mestasis   . Pneumonia 2012  . Sciatica   . Seizures ([place])    07/24/11 off Effexor w/o  seizure  . Sepsis ([place]) 08/04/12  . Sinusitis, chronic 07/20/2011   Bilateral maxillary, identified on MRI of head 07/14/11.    . Tracheostomy dependent [facility])     Past Surgical History:  Procedure Laterality Date  . BREAST LUMPECTOMY Right 07/06/2018  . BREAST LUMPECTOMY WITH RADIOACTIVE SEED LOCALIZATION Right 07/06/2018   Procedure: RIGHT BREAST LUMPECTOMY WITH RADIOACTIVE SEED LOCALIZATION;  Surgeon: Rolm Bookbinder, MD;  Location: [place];  Service: General;  Laterality: Right;  . COLONOSCOPY N/A 11/15/2012   Procedure: COLONOSCOPY;  Surgeon: Lafayette Dragon, MD;  [facility];  Service: Endoscopy;  Laterality: N/A;  . DENTAL RESTORATION/EXTRACTION WITH X-RAY    . ESOPHAGEAL DILATION N/A 09/09/2019   Procedure: ESOPHAGEAL DILATION;  Surgeon: [name], MD;  Location: [place];  Service: ENT;  Laterality: N/A;  . ESOPHAGEAL DILATION N/A 10/05/2019   Procedure: ESOPHAGEAL DILATION;  Surgeon: [name], MD;  Location: [place];  Service: ENT;  Laterality: N/A;  via tracheostomy  . ESOPHAGOGASTRODUODENOSCOPY (EGD) WITH PROPOFOL N/A 08/12/2018   Procedure: ESOPHAGOGASTRODUODENOSCOPY (EGD) WITH PROPOFOL;  Surgeon: Doran Stabler, MD;  Location: [place];  Service: Gastroenterology;  Laterality: N/A;  . ESOPHAGOSCOPY  06/21/2012   Procedure: ESOPHAGOSCOPY;  Surgeon: [name], MD;  Location: [place];  Service: ENT;  Laterality: N/A;  . ESOPHAGOSCOPY WITH DILITATION N/A 09/21/2014   Procedure: ESOPHAGOSCOPY WITH DILITATION;  Surgeon: [name], MD;  Location: [place];  Service: ENT;  Laterality: N/A;  . ESOPHAGOSCOPY WITH DILITATION N/A 07/04/2016   Procedure: ESOPHAGOSCOPY WITH DILITATION;  Surgeon: [name], MD;  Location: [place];  Service: ENT;  Laterality: N/A;  . ESOPHAGOSCOPY WITH DILITATION N/A 12/01/2017   Procedure: ESOPHAGOSCOPY WITH DILITATION;  Surgeon: [name], MD;  Location: [place];  Service: ENT;  Laterality:  N/A;  . ESOPHAGOSCOPY WITH DILITATION N/A 04/19/2018   Procedure: ESOPHAGOSCOPY WITH DILITATION;  Surgeon: [name], MD;  Location: [name];  Service: ENT;  Laterality: N/A;  . ESOPHAGOSCOPY WITH DILITATION N/A 03/07/2019   Procedure: Esophagoscopy with dilatation;  Surgeon: [name], MD;  Location: [place];  Service: ENT;  Laterality: N/A;  . FLEXIBLE BRONCHOSCOPY  01/08/2018      . FOREIGN BODY REMOVAL BRONCHIAL  10/02/2011   Procedure: REMOVAL FOREIGN BODY BRONCHIAL;  Surgeon: Ruby Cola, MD;  Location: [place];  Service: ENT;  Laterality: N/A;  . FOREIGN BODY REMOVAL BRONCHIAL N/A 01/08/2018   Procedure: REMOVAL FOREIGN BODY BRONCHIAL;  Surgeon: Jodi Marble, MD;  [facility];  Service: ENT;  Laterality: N/A;  . LARYNGECTOMY    . Porta cath removal    . PORTACATH PLACEMENT  09/17/10   Tip in cavoatrial junction  . RE-EXCISION OF BREAST CANCER,SUPERIOR MARGINS Right 07/29/2018   Procedure: RE-EXCISION OF RIGHT BREAST MEDIAL MARGINS;  Surgeon: Rolm Bookbinder, MD;  Location: [place];  Service: General;  Laterality: Right;  . RIGID ESOPHAGOSCOPY N/A 03/19/2019   Procedure: FLEXIBLE ESOPHAGOSCOPY;  Surgeon: Jodi Marble, MD;  Location: [place];  Service: ENT;  Laterality: N/A;  . STOMAPLASTY N/A 10/21/2016   Procedure: Zola Button;  Surgeon: [name], MD;  Location: [place];  Service: ENT;  Laterality: N/A;  . TOTAL KNEE ARTHROPLASTY Right 12/12/2019   Procedure: RIGHT TOTAL KNEE ARTHROPLASTY;  Surgeon: Leandrew Koyanagi, MD;  Location: [place];  Service: Orthopedics;  Laterality: Right;  . TRACHEAL DILITATION  07/16/2011   Procedure: TRACHEAL DILITATION;  Surgeon: Beckie Salts, MD;  Location: [place];  Service: ENT;  Laterality: N/A;  dilation of tracheal stoma and replacement of stoma tube  . TUBAL LIGATION  1982    There were no vitals filed for this visit.   Subjective Assessment - 02/22/20 0825    Subjective Patient reports she is doing with no new issues. Knee  continues to improve.    How long can you stand comfortably? No limitation    How long can you walk comfortably? No limitation    Patient Stated Goals Get rid of the pain and back to walking    Currently in Pain? No/denies              [facility] PT Assessment - 02/22/20 0001      Assessment   Medical Diagnosis Right TKA    Referring Provider (PT) Aundra Dubin, PA-C    Onset Date/Surgical Date 12/12/19    Next MD Visit 03/06/2020      Precautions   Precautions None      Restrictions   Weight Bearing Restrictions No      Balance Screen   Has the patient fallen in the past 6 months No    Has the patient had a decrease  in activity level because of a fear of falling?  No    Is the patient reluctant to leave their home because of a fear of falling?  No      Prior Function   Level of Independence Independent with basic ADLs      Cognition   Overall Cognitive Status Within Functional Limits for tasks assessed      Observation/Other Assessments   Observations Patient appears in no apparent distress    Focus on Therapeutic Outcomes (FOTO)  NA - MCD      Functional Tests   Functional tests Sit to Stand;Single leg stance      Single Leg Stance   Comments > 30 sec bilaterally      Sit to Stand   Comments WFL      AROM   Right Knee Extension -2   lacking   Right Knee Flexion 126      Strength   Right Hip Flexion 4/5    Right Hip Extension 4/5    Right Hip ABduction 4/5    Right Knee Flexion 5/5    Right Knee Extension 5/5                         [facility] Adult PT Treatment/Exercise - 02/22/20 0001      Neuro Re-ed    Neuro Re-ed Details  SLS 3 x 30 sec each on Airex      Knee/Hip Exercises: Stretches   Passive Hamstring Stretch 2 reps;30 seconds    Passive Hamstring Stretch Limitations seated edge of chair    Gastroc Stretch 2 reps;30 seconds    Gastroc Stretch Limitations slant board      Knee/Hip Exercises: Aerobic   Recumbent Bike L3 x 5 min        Knee/Hip Exercises: Machines for Strengthening   Cybex Knee Extension 35# 2x10    Cybex Knee Flexion 35# 2x10    Cybex Leg Press 105# x8, 110# 2x8      Knee/Hip Exercises: Standing   Forward Step Up 2 sets;10 reps    Forward Step Up Limitations 8" step height    Other Standing Knee Exercises Lateral band walk with green below knees 3x15    Other Standing Knee Exercises Dead lift with 25# 2x8                  PT Education - 02/22/20 0826    Education Details HEP    Person(s) Educated Patient    Methods Explanation    Comprehension Verbalized understanding;Need further instruction            PT Short Term Goals - 02/22/20 0835      PT SHORT TERM GOAL #1   Title Patient will be I with initial HEP to progress with PT    Baseline Patient independent - 02/22/2020    Time 3    Period Weeks    Status Achieved    Target Date 01/18/20      PT SHORT TERM GOAL #2   Title Patient will exhibit improved AROM knee flexion to >/= 110 deg and knee extension to </= 5 deg lacking from neutral to improve gait and transfers    Baseline Flexion: 120, Extension: 4 lacking - 01/12/2020    Time 3    Period Weeks    Status Achieved    Target Date 01/18/20      PT SHORT TERM GOAL #3   Title Patient  will report </= 6/10 pain level with activity to improve functional ability    Baseline 4/10 pain level - 01/16/2020    Time 3    Period Weeks    Status Achieved    Target Date 01/18/20             PT Long Term Goals - 02/22/20 0835      PT LONG TERM GOAL #1   Title Patient will be I with final HEP to maintain progress from PT    Baseline Patient continues to require cueing for advanced HEP - 02/22/2020    Time 1    Period Weeks    Status On-going    Target Date 02/29/20      PT LONG TERM GOAL #2   Title Patient will demonstrated right knee AROM WFL to all for normalized transfers and gait    Baseline 2-126 deg - 02/22/2020    Status Achieved      PT LONG TERM GOAL #3   Title  Patient will exhibit right knee strength grossly 5/5 to improve stair negotiation and activity tolerance    Baseline Patient exhibits 5/5 strength - 02/22/2020    Status Achieved      PT LONG TERM GOAL #4   Title Patient will report no limitation with walking or standing    Baseline Patient denies limitation with standing or walking - 02/22/2020    Status Achieved                 Plan - 02/22/20 0828    Clinical Impression Statement Patient tolerated therapy well with no adverse effects. She is making great progress toward her goals and is demonstrating improvement with motion, strength, balance, and function. Patient has one more scheduled appointment so will plan to finalize HEP and discharge next visit. She would benefit from continued skilled PT to progress motion and strength to improve walking ability and maximize functional level.    PT Frequency 1x / week    PT Duration Other (comment)   1 week   PT Treatment/Interventions ADLs/Self Care Home Management;Cryotherapy;Electrical Stimulation;Moist Heat;Neuromuscular re-education;Balance training;Therapeutic exercise;Therapeutic activities;Functional mobility training;Stair training;Gait training;Patient/family education;Manual techniques;Dry needling;Passive range of motion;Taping;Vasopneumatic Device;Joint Manipulations    PT Next Visit Plan Finalize HEP, continued strengthening/balance, anticipate discharge    PT Home Exercise Plan [id number]: heel prop, standing calf stretch with strap, supine heel slide with strap, seated hamstring stretch, seated knee AROM; SLR, hip abduction, hip extension, bridge, banded LAQ and standing hamstring curl, heel raises, sit<>stand, SLS    Consulted and Agree with Plan of Care Patient           Patient will benefit from skilled therapeutic intervention in order to improve the following deficits and impairments:  Abnormal gait, Decreased range of motion, Difficulty walking, Pain, Decreased activity  tolerance, Decreased balance, Decreased strength, Improper body mechanics, Impaired flexibility  Visit Diagnosis: Chronic pain of right knee  Stiffness of right knee, not elsewhere classified  Muscle weakness (generalized)  Other abnormalities of gait and mobility  Localized edema     Problem List Patient Active Problem List   Diagnosis Date Noted  . Status post total right knee replacement 12/12/2019  . Primary osteoarthritis of right knee 12/11/2019  . Epidermal cyst of neck 07/21/2019  . Localized swelling, mass and lump, neck 07/20/2019  . Allergic rhinitis 05/20/2019  . GERD (gastroesophageal reflux disease)   . COPD (chronic obstructive pulmonary disease) ([place])   . Chronic diastolic (congestive) heart failure ([place])   .  Foreign body aspiration   . Breast cancer ([place]) 07/06/2018  . 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[place]) 04/08/2018  . Carpal tunnel syndrome 03/16/2018  . Obesity 08/24/2017  . Plantar fasciitis 06/06/2016  . Low back pain 05/21/2016  . Health care maintenance 12/29/2015  . Osteoarthritis of knees, bilateral 05/31/2015  . Knee pain 01/04/2015  . Dysphagia 08/16/2014  . Weight gain 08/05/2014  . DOE (dyspnea on exertion) 04/28/2014  . Frequent falls 12/23/2013  . Bilateral shoulder pain 04/07/2013  . Anemia in chronic illness 08/11/2012  . Status post trachelectomy 08/04/2012  . Chronic pain 01/20/2012  . History of head and neck cancer 09/26/2011  . Hx of radiation therapy   . Tracheitis 07/17/2011  . Seizure ([place]) 07/17/2011  . Hypothyroidism 03/19/2011  . Essential hypertension 03/19/2011  . 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 [place], [place], [address] Phone: [phone]   Fax:   [phone]  Name: Vale Peraza MRN: [id number] Date of Birth: [birth year]

## 2020-02-23 ENCOUNTER — Other Ambulatory Visit (HOSPITAL_COMMUNITY)
Admission: RE | Admit: 2020-02-23 | Discharge: 2020-02-23 | Disposition: A | Discharge status: Home / Self Care | Payer: Medicaid Other | Source: Ambulatory Visit | Attending: Otolaryngology | Admitting: Otolaryngology

## 2020-02-23 DIAGNOSIS — Z01812 Encounter for preprocedural laboratory examination: Secondary | ICD-10-CM | POA: Diagnosis not present

## 2020-02-23 DIAGNOSIS — Z20822 Contact with and (suspected) exposure to covid-19: Secondary | ICD-10-CM | POA: Diagnosis not present

## 2020-02-23 LAB — SARS CORONAVIRUS 2 (TAT 6-24 HRS): SARS Coronavirus 2: NEGATIVE

## 2020-02-24 NOTE — H&P (Signed)
HPI:   Deborah [name] is a 64 y.o. female who presents as a return Patient.   Referring Provider: Self, A Referral  Chief complaint: Swallowing.  HPI: 10 years post laryngectomy and radiation therapy. Intermittent esophageal stricture requiring dilatation. Her last one was last March and about a week ago she started having some trouble again.  PMH/Meds/All/SocHx/FamHx/ROS:   Past Medical History:  Diagnosis Date  . Cancer ([place])  . COPD (chronic obstructive pulmonary disease) ([place])  . History of radiation therapy  . Hypertension  . Hypothyroidism  . Seizures ([place])   Past Surgical History:  Procedure Laterality Date  . Esophagoscopy with dilation  . stomoplasty  . TRACHEOSTOMY   No family history of bleeding disorders, wound healing problems or difficulty with anesthesia.   Social History   Socioeconomic History  . Marital status: Divorced  Spouse name: Not on file  . Number of children: Not on file  . Years of education: Not on file  . Highest education level: Not on file  Occupational History  . Not on file  Tobacco Use  . Smoking status: Former Smoker  Packs/day: 0.00  Quit date: 2010  Years since quitting: 11.2  . Smokeless tobacco: Never Used  Substance and Sexual Activity  . Alcohol use: Yes  Comment: occassionally  . Drug use: Not on file  . Sexual activity: Not on file  Other Topics Concern  . Not on file  Social History Narrative  . Not on file   Social Determinants of Health   Financial Resource Strain:  . Difficulty of Paying Living Expenses:  Food Insecurity:  . Worried About Charity fundraiser in the Last Year:  . Arboriculturist in the Last Year:  Transportation Needs:  . Film/video editor (Medical):  [name] Lack of Transportation (Non-Medical):  Physical Activity:  . Days of Exercise per Week:  . Minutes of Exercise per Session:  Stress:  . Feeling of Stress :  Social Connections:  . Frequency of Communication with Friends and  Family:  . Frequency of Social Gatherings with Friends and Family:  . Attends Religious Services:  . Active Member of Clubs or Organizations:  . Attends Archivist Meetings:  [name] Marital Status:   Current Outpatient Medications:  . albuterol 90 mcg/actuation inhaler, INHALE 2 PUFFS INTO THE LUNGS EVERY 4 HOURS AS NEEDED FOR WHEEZING OR SHORTNESS OF BREATH., Disp: , Rfl:  . amLODIPine (NORVASC) 10 MG tablet, TAKE 1 TABLET BY MOUTH ONCE DAILY, Disp: , Rfl: 3 . anastrozole (ARIMIDEX) 1 mg tablet, Take 1 mg by mouth., Disp: , Rfl:  . benzonatate (TESSALON) 100 MG capsule, TAKE ONE CAPSULE BY MOUTH THREE TIMES DAILY AS NEEDED FOR COUGH, Disp: , Rfl:  . buPROPion XL (WELLBUTRIN XL) 150 MG 24 hr tablet, TAKE ONE TABLET BY MOUTH ONCE DAILY, Disp: , Rfl:  . cetirizine (ZYRTEC) 10 MG tablet, TAKE 1 TABLET BY MOUTH ONCE DAILY, Disp: , Rfl:  . cyclobenzaprine (FLEXERIL) 10 MG tablet, TAKE ONE TABLET BY MOUTH TWICE DAILY FOR MUSCLE SPASM, Disp: , Rfl:  . dextromethorphan-guaifenesin (MUCINEX DM) 30-600 mg per tablet, Take 1 tablet by mouth., Disp: , Rfl:  . gabapentin (NEURONTIN) 300 MG capsule, Take by mouth., Disp: , Rfl:  . levothyroxine (SYNTHROID) 200 MCG tablet, TAKE 1 TABLET BY MOUTH DAILY BEFORE BREAKFAST, Disp: , Rfl: 3 . loratadine (CLARITIN) 10 mg tablet, TAKE ONE TABLET BY MOUTH ONCE DAILY, Disp: , Rfl:  . losartan (COZAAR) 100 MG tablet, TAKE  ONE TABLET BY MOUTH DAILY, Disp: , Rfl:  . MELATONIN ORAL, Take 1 tablet by mouth., Disp: , Rfl:  . meloxicam (MOBIC) 7.5 MG tablet, Take 7.5 mg by mouth., Disp: , Rfl:  . metoclopramide HCl (REGLAN) 10 MG tablet, Take by mouth., Disp: , Rfl:  . metoPROLOL tartrate (LOPRESSOR) 25 MG tablet, TAKE ONE TABLET BY MOUTH TWICE DAILY, Disp: , Rfl:  . mometasone-formoterol (DULERA) 200-5 mcg/actuation HFAA inhaler, INHALE TWO PUFFS BY MOUTH TWICE DAILY, Disp: , Rfl:  . nystatin (MYCOSTATIN) 100,000 unit/gram cream, Apply topically., Disp: , Rfl:  .  omeprazole (PRILOSEC) 40 MG capsule, Take 30- 60 min before your first and last meals of the day, Disp: , Rfl:  . predniSONE (DELTASONE) 50 MG tablet, Take once daily with food., Disp: , Rfl:  . scopolamine (TRANSDERM-SCOP) 1 mg over 3 days patch, PLACE 1 PATCH ONTO THE SKIN EVERY 3 DAYS, Disp: , Rfl:  . tiotropium (SPIRIVA WITH HANDIHALER) 18 mcg inhalation capsule, PLACE ONE CAPSULE INTO INHALER AND INHALE DAILY., Disp: , Rfl:  . tiotropium (SPIRIVA) 18 mcg inhalation capsule, Place 18 mcg into inhaler and inh., Disp: , Rfl:  . furosemide (LASIX) 20 MG tablet, Take 20 mg by mouth., Disp: , Rfl:   A complete ROS was performed with pertinent positives/negatives noted in the HPI. The remainder of the ROS are negative.   Physical Exam:   BP 161/89  Pulse 86  Temp 97.1 F (36.2 C)  Ht 1.702 m (5\' 7" )  Wt 100.7 kg (222 lb)  BMI 34.77 kg/m   General: Healthy and alert, in no distress, breathing easily. Normal affect. In a pleasant mood. Head: Normocephalic, atraumatic. No masses, or scars. Eyes: Pupils are equal, and reactive to light. Vision is grossly intact. No spontaneous or gaze nystagmus. Ears: Ear canals are clear. Tympanic membranes are intact, with normal landmarks and the middle ears are clear and healthy. Hearing: Grossly normal. Nose: Nasal cavities are clear with healthy mucosa, no polyps or exudate. Airways are patent. Face: No masses or scars, facial nerve function is symmetric. Oral Cavity: No mucosal abnormalities are noted. Tongue with normal mobility.  Oropharynx: There are no mucosal masses identified. Tongue base appears normal and healthy. Larynx/Hypopharynx: Tracheal stoma in good condition with TEP in place and excellent phonation. Chest: Deferred Neck: No palpable masses, no cervical adenopathy, no thyroid nodules or enlargement. Neuro: Cranial nerves II-XII with normal function. Balance: Normal gate. Other findings: none.  Independent Review of Additional  Tests or Records:  none  Procedures:  none  Impression & Plans:  No evidence of recurrent disease. Esophageal stricture. Recommend schedule for esophagoscopy with dilatation.

## 2020-02-26 NOTE — Anesthesia Preprocedure Evaluation (Addendum)
Anesthesia Evaluation  Patient identified by MRN, date of birth, ID band Patient awake    Reviewed: Allergy & Precautions, NPO status , Patient's Chart, lab work & pertinent test results  Airway Mallampati: Trach   Neck ROM: Full    Dental no notable dental hx. (+) Edentulous Lower, Edentulous Upper   Pulmonary shortness of breath, asthma , COPD,  COPD inhaler, former smoker,  S/p laryngectomy   Pulmonary exam normal breath sounds clear to auscultation       Cardiovascular hypertension, Pt. on home beta blockers and Pt. on medications + DOE  Normal cardiovascular exam Rhythm:Regular Rate:Normal     Neuro/Psych Seizures -,  Depression    GI/Hepatic GERD  Medicated and Controlled,  Endo/Other  Hypothyroidism   Renal/GU      Musculoskeletal  (+) Arthritis , Osteoarthritis,    Abdominal (+) + obese,   Peds  Hematology   Anesthesia Other Findings   Reproductive/Obstetrics                            Anesthesia Physical Anesthesia Plan  ASA: III  Anesthesia Plan: General   Post-op Pain Management:    Induction: Intravenous  PONV Risk Score and Plan: 4 or greater and Treatment may vary due to age or medical condition, Ondansetron, Midazolam and Dexamethasone  Airway Management Planned: Tracheostomy  Additional Equipment: None  Intra-op Plan:   Post-operative Plan: Extubation in OR  Informed Consent: I have reviewed the patients History and Physical, chart, labs and discussed the procedure including the risks, benefits and alternatives for the proposed anesthesia with the patient or authorized representative who has indicated his/her understanding and acceptance.     Dental advisory given  Plan Discussed with: CRNA and Anesthesiologist  Anesthesia Plan Comments:       Anesthesia Quick Evaluation

## 2020-02-27 ENCOUNTER — Encounter (HOSPITAL_BASED_OUTPATIENT_CLINIC_OR_DEPARTMENT_OTHER): Payer: Self-pay | Admitting: Otolaryngology

## 2020-02-27 ENCOUNTER — Other Ambulatory Visit: Payer: Self-pay

## 2020-02-27 ENCOUNTER — Encounter (HOSPITAL_BASED_OUTPATIENT_CLINIC_OR_DEPARTMENT_OTHER)
Admission: RE | Disposition: A | Discharge status: Home / Self Care | Payer: Self-pay | Source: Home / Self Care | Attending: Otolaryngology

## 2020-02-27 ENCOUNTER — Ambulatory Visit (HOSPITAL_BASED_OUTPATIENT_CLINIC_OR_DEPARTMENT_OTHER): Payer: Medicaid Other | Admitting: Anesthesiology

## 2020-02-27 ENCOUNTER — Ambulatory Visit (HOSPITAL_BASED_OUTPATIENT_CLINIC_OR_DEPARTMENT_OTHER)
Admission: RE | Admit: 2020-02-27 | Discharge: 2020-02-27 | Disposition: A | Discharge status: Home / Self Care | Payer: Medicaid Other | Attending: Otolaryngology | Admitting: Otolaryngology

## 2020-02-27 DIAGNOSIS — Z923 Personal history of irradiation: Secondary | ICD-10-CM | POA: Diagnosis not present

## 2020-02-27 DIAGNOSIS — Z79899 Other long term (current) drug therapy: Secondary | ICD-10-CM | POA: Insufficient documentation

## 2020-02-27 DIAGNOSIS — F329 Major depressive disorder, single episode, unspecified: Secondary | ICD-10-CM | POA: Diagnosis not present

## 2020-02-27 DIAGNOSIS — E039 Hypothyroidism, unspecified: Secondary | ICD-10-CM | POA: Insufficient documentation

## 2020-02-27 DIAGNOSIS — Z87891 Personal history of nicotine dependence: Secondary | ICD-10-CM | POA: Diagnosis not present

## 2020-02-27 DIAGNOSIS — E669 Obesity, unspecified: Secondary | ICD-10-CM | POA: Diagnosis not present

## 2020-02-27 DIAGNOSIS — Z7952 Long term (current) use of systemic steroids: Secondary | ICD-10-CM | POA: Diagnosis not present

## 2020-02-27 DIAGNOSIS — Z9002 Acquired absence of larynx: Secondary | ICD-10-CM | POA: Diagnosis not present

## 2020-02-27 DIAGNOSIS — Z7951 Long term (current) use of inhaled steroids: Secondary | ICD-10-CM | POA: Diagnosis not present

## 2020-02-27 DIAGNOSIS — I110 Hypertensive heart disease with heart failure: Secondary | ICD-10-CM | POA: Diagnosis not present

## 2020-02-27 DIAGNOSIS — R569 Unspecified convulsions: Secondary | ICD-10-CM | POA: Diagnosis not present

## 2020-02-27 DIAGNOSIS — Z6833 Body mass index (BMI) 33.0-33.9, adult: Secondary | ICD-10-CM | POA: Diagnosis not present

## 2020-02-27 DIAGNOSIS — K222 Esophageal obstruction: Secondary | ICD-10-CM | POA: Insufficient documentation

## 2020-02-27 DIAGNOSIS — R0602 Shortness of breath: Secondary | ICD-10-CM | POA: Insufficient documentation

## 2020-02-27 DIAGNOSIS — R0609 Other forms of dyspnea: Secondary | ICD-10-CM | POA: Insufficient documentation

## 2020-02-27 DIAGNOSIS — I10 Essential (primary) hypertension: Secondary | ICD-10-CM | POA: Diagnosis not present

## 2020-02-27 DIAGNOSIS — J449 Chronic obstructive pulmonary disease, unspecified: Secondary | ICD-10-CM | POA: Diagnosis not present

## 2020-02-27 DIAGNOSIS — M1990 Unspecified osteoarthritis, unspecified site: Secondary | ICD-10-CM | POA: Diagnosis not present

## 2020-02-27 DIAGNOSIS — K219 Gastro-esophageal reflux disease without esophagitis: Secondary | ICD-10-CM | POA: Diagnosis not present

## 2020-02-27 DIAGNOSIS — Z8521 Personal history of malignant neoplasm of larynx: Secondary | ICD-10-CM | POA: Insufficient documentation

## 2020-02-27 DIAGNOSIS — Z853 Personal history of malignant neoplasm of breast: Secondary | ICD-10-CM | POA: Diagnosis not present

## 2020-02-27 DIAGNOSIS — I5032 Chronic diastolic (congestive) heart failure: Secondary | ICD-10-CM | POA: Diagnosis not present

## 2020-02-27 DIAGNOSIS — Z791 Long term (current) use of non-steroidal anti-inflammatories (NSAID): Secondary | ICD-10-CM | POA: Diagnosis not present

## 2020-02-27 HISTORY — PX: ESOPHAGEAL DILATION: SHX303

## 2020-02-27 SURGERY — DILATION, ESOPHAGUS
Anesthesia: General | Site: Mouth

## 2020-02-27 MED ORDER — PROPOFOL 10 MG/ML IV BOLUS
INTRAVENOUS | Status: DC | PRN
  Administered 2020-02-27: 90 mg via INTRAVENOUS
  Administered 2020-02-27 (×2): 40 mg via INTRAVENOUS

## 2020-02-27 MED ORDER — PROPOFOL 10 MG/ML IV BOLUS
INTRAVENOUS | Status: AC
  Filled 2020-02-27: qty 20

## 2020-02-27 MED ORDER — FENTANYL CITRATE (PF) 100 MCG/2ML IJ SOLN
INTRAMUSCULAR | Status: DC | PRN
Start: 2020-02-27 — End: 2020-02-27
  Administered 2020-02-27: 50 ug via INTRAVENOUS

## 2020-02-27 MED ORDER — MIDAZOLAM HCL 2 MG/2ML IJ SOLN
INTRAMUSCULAR | Status: AC
  Filled 2020-02-27: qty 2

## 2020-02-27 MED ORDER — LIDOCAINE 2% (20 MG/ML) 5 ML SYRINGE
INTRAMUSCULAR | Status: AC
  Filled 2020-02-27: qty 5

## 2020-02-27 MED ORDER — FENTANYL CITRATE (PF) 100 MCG/2ML IJ SOLN
INTRAMUSCULAR | Status: AC
  Filled 2020-02-27: qty 2

## 2020-02-27 MED ORDER — ONDANSETRON HCL 4 MG/2ML IJ SOLN: 4.0000 mg | Freq: Once | INTRAMUSCULAR | Status: DC | PRN

## 2020-02-27 MED ORDER — DEXAMETHASONE SODIUM PHOSPHATE 10 MG/ML IJ SOLN
INTRAMUSCULAR | Status: AC
  Filled 2020-02-27: qty 1

## 2020-02-27 MED ORDER — DEXAMETHASONE SODIUM PHOSPHATE 4 MG/ML IJ SOLN
INTRAMUSCULAR | Status: DC | PRN
  Administered 2020-02-27: 10 mg via INTRAVENOUS

## 2020-02-27 MED ORDER — ACETAMINOPHEN 10 MG/ML IV SOLN: 1000.0000 mg | Freq: Once | INTRAVENOUS | Status: DC | PRN

## 2020-02-27 MED ORDER — OXYCODONE HCL 5 MG/5ML PO SOLN: 5.0000 mg | Freq: Once | ORAL | Status: DC | PRN

## 2020-02-27 MED ORDER — ONDANSETRON HCL 4 MG/2ML IJ SOLN
INTRAMUSCULAR | Status: AC
  Filled 2020-02-27: qty 2

## 2020-02-27 MED ORDER — MEPERIDINE HCL 25 MG/ML IJ SOLN: 6.2500 mg | INTRAMUSCULAR | Status: DC | PRN

## 2020-02-27 MED ORDER — MIDAZOLAM HCL 5 MG/5ML IJ SOLN
INTRAMUSCULAR | Status: DC | PRN
  Administered 2020-02-27: 2 mg via INTRAVENOUS

## 2020-02-27 MED ORDER — EPINEPHRINE PF 1 MG/ML IJ SOLN
INTRAMUSCULAR | Status: AC
  Filled 2020-02-27: qty 1

## 2020-02-27 MED ORDER — ONDANSETRON HCL 4 MG/2ML IJ SOLN
INTRAMUSCULAR | Status: DC | PRN
  Administered 2020-02-27: 4 mg via INTRAVENOUS

## 2020-02-27 MED ORDER — LACTATED RINGERS IV SOLN: INTRAVENOUS | Status: DC

## 2020-02-27 MED ORDER — OXYCODONE HCL 5 MG PO TABS: 5.0000 mg | ORAL_TABLET | Freq: Once | ORAL | Status: DC | PRN

## 2020-02-27 MED ORDER — DROPERIDOL 2.5 MG/ML IJ SOLN: 0.6250 mg | Freq: Once | INTRAMUSCULAR | Status: DC | PRN

## 2020-02-27 MED ORDER — ROCURONIUM BROMIDE 10 MG/ML (PF) SYRINGE
PREFILLED_SYRINGE | INTRAVENOUS | Status: AC
  Filled 2020-02-27: qty 10

## 2020-02-27 MED ORDER — LIDOCAINE HCL (CARDIAC) PF 100 MG/5ML IV SOSY
PREFILLED_SYRINGE | INTRAVENOUS | Status: DC | PRN
  Administered 2020-02-27: 100 mg via INTRAVENOUS

## 2020-02-27 MED ORDER — FENTANYL CITRATE (PF) 100 MCG/2ML IJ SOLN: 25.0000 ug | INTRAMUSCULAR | Status: DC | PRN

## 2020-02-27 SURGICAL SUPPLY — 24 items
CANISTER SUCT 1200ML W/VALVE (MISCELLANEOUS) IMPLANT
CNTNR URN SCR LID CUP LEK RST (MISCELLANEOUS) IMPLANT
CONT SPEC 4OZ STRL OR WHT (MISCELLANEOUS)
COVER WAND RF STERILE (DRAPES) IMPLANT
GAUZE SPONGE 4X4 12PLY STRL LF (GAUZE/BANDAGES/DRESSINGS) ×4 IMPLANT
GLOVE BIO SURGEON STRL SZ7 (GLOVE) ×2 IMPLANT
GLOVE ECLIPSE 7.5 STRL STRAW (GLOVE) ×2 IMPLANT
GOWN STRL REUS W/ TWL LRG LVL3 (GOWN DISPOSABLE) ×1 IMPLANT
GOWN STRL REUS W/ TWL XL LVL3 (GOWN DISPOSABLE) IMPLANT
GOWN STRL REUS W/TWL LRG LVL3 (GOWN DISPOSABLE) ×2
GOWN STRL REUS W/TWL XL LVL3 (GOWN DISPOSABLE)
GUARD TEETH (MISCELLANEOUS) IMPLANT
NEEDLE HYPO 18GX1.5 BLUNT FILL (NEEDLE) IMPLANT
NEEDLE SPNL 22GX7 QUINCKE BK (NEEDLE) IMPLANT
NS IRRIG 1000ML POUR BTL (IV SOLUTION) IMPLANT
PACK BASIN DAY SURGERY FS (CUSTOM PROCEDURE TRAY) IMPLANT
PATTIES SURGICAL .5 X3 (DISPOSABLE) IMPLANT
SHEET MEDIUM DRAPE 40X70 STRL (DRAPES) ×2 IMPLANT
SURGILUBE 2OZ TUBE FLIPTOP (MISCELLANEOUS) ×2 IMPLANT
SYR 5ML LL (SYRINGE) IMPLANT
SYR CONTROL 10ML LL (SYRINGE) IMPLANT
TOWEL GREEN STERILE FF (TOWEL DISPOSABLE) ×2 IMPLANT
TUBE CONNECTING 20X1/4 (TUBING) ×2 IMPLANT
YANKAUER SUCT BULB TIP NO VENT (SUCTIONS) ×2 IMPLANT

## 2020-02-27 NOTE — Discharge Instructions (Signed)

## 2020-02-27 NOTE — Anesthesia Procedure Notes (Signed)
Procedure Name: Intubation Date/Time: 02/27/2020 8:17 AM Performed by: Glory Buff, CRNA Pre-anesthesia Checklist: Patient identified, Emergency Drugs available, Suction available and Patient being monitored Patient Re-evaluated:Patient Re-evaluated prior to induction Oxygen Delivery Method: Circle system utilized Preoxygenation: Pre-oxygenation with 100% oxygen Induction Type: IV induction Tube size: 6.0 mm Number of attempts: 1 Placement Confirmation: positive ETCO2 and ETT inserted through vocal cords under direct vision Secured at: 8 cm Tube secured with: Tape Dental Injury: Teeth and Oropharynx as per pre-operative assessment  Comments: ETT placed via trach site with ease.

## 2020-02-27 NOTE — Op Note (Signed)
OPERATIVE REPORT  DATE OF SURGERY: 02/27/2020  PATIENT:  Deborah [name],  64 y.o. female  PRE-OPERATIVE DIAGNOSIS:  esophageal stricture  POST-OPERATIVE DIAGNOSIS:  esophageal stricture  PROCEDURE:  Procedure(s): ESOPHAGEAL DILATION  SURGEON:  Beckie Salts, MD  ASSISTANTS: None  ANESTHESIA:   General   EBL: Less than 5 ml  DRAINS: None  LOCAL MEDICATIONS USED:  None  SPECIMEN:  none  COUNTS:  Correct  PROCEDURE DETAILS: The patient was taken to the operating room and placed on the operating table in the supine position. Following induction of general anesthesia, through the tracheal stoma, patient was draped in a standard fashion for upper endoscopy.  A Jackson sliding laryngoscope was used to evaluate the larynx.  Esophageal dilators were used without the stylette's, starting at 33 [place] and serially dilating up to 49 [place].  Following that a cervical rigid esophagoscope was used to evaluate the esophageal wall and there was no tears or injuries identified.  Esophagoscope passed easily through the lumen of the esophagus.  Patient was awakened extubated and transferred to recovery in stable condition.    PATIENT DISPOSITION:  To PACU, stable

## 2020-02-27 NOTE — Anesthesia Postprocedure Evaluation (Signed)
Anesthesia Post Note  Patient: Grace Blight Kirks  Procedure(s) Performed: ESOPHAGEAL DILATION (N/A Mouth)     Patient location during evaluation: PACU Anesthesia Type: General Level of consciousness: awake and alert Pain management: pain level controlled Vital Signs Assessment: post-procedure vital signs reviewed and stable Respiratory status: spontaneous breathing, nonlabored ventilation, respiratory function stable and patient connected to nasal cannula oxygen Cardiovascular status: blood pressure returned to baseline and stable Postop Assessment: no apparent nausea or vomiting Anesthetic complications: no   No complications documented.  Last Vitals:  Vitals:   02/27/20 0947 02/27/20 1000  BP: (!) 157/82 (!) 157/84  Pulse: 68 70  Resp: 16 18  Temp:  (!) 36.3 C  SpO2: 95% 97%    Last Pain:  Vitals:   02/27/20 1000  TempSrc:   PainSc: 0-No pain                 Barnet [place]

## 2020-02-27 NOTE — Interval H&P Note (Signed)
History and Physical Interval Note:  02/27/2020 8:08 AM  Deborah [name]  has presented today for surgery, with the diagnosis of esophageal stricture.  The various methods of treatment have been discussed with the patient and family. After consideration of risks, benefits and other options for treatment, the patient has consented to  Procedure(s): ESOPHAGEAL DILATION (N/A) as a surgical intervention.  The patient's history has been reviewed, patient examined, no change in status, stable for surgery.  I have reviewed the patient's chart and labs.  Questions were answered to the patient's satisfaction.     [name]

## 2020-02-27 NOTE — Transfer of Care (Signed)
Immediate Anesthesia Transfer of Care Note  Patient: Deborah [name]  Procedure(s) Performed: ESOPHAGEAL DILATION (N/A Mouth)  Patient Location: PACU  Anesthesia Type:General  Level of Consciousness: drowsy, patient cooperative and responds to stimulation  Airway & Oxygen Therapy: Patient Spontanous Breathing and Patient connected to face mask oxygen  Post-op Assessment: Report given to RN and Post -op Vital signs reviewed and stable  Post vital signs: Reviewed and stable  Last Vitals:  Vitals Value Taken Time  BP    Temp    Pulse 73 02/27/20 0847  Resp    SpO2 98 % 02/27/20 0847  Vitals shown include unvalidated device data.  Last Pain:  Vitals:   02/27/20 0719  TempSrc: Oral  PainSc: 0-No pain         Complications: No complications documented.

## 2020-02-29 ENCOUNTER — Encounter: Payer: Self-pay | Admitting: Physical Therapy

## 2020-02-29 ENCOUNTER — Ambulatory Visit: Payer: Medicaid Other | Admitting: Physical Therapy

## 2020-02-29 ENCOUNTER — Other Ambulatory Visit: Payer: Self-pay

## 2020-02-29 DIAGNOSIS — M6281 Muscle weakness (generalized): Secondary | ICD-10-CM

## 2020-02-29 DIAGNOSIS — M25561 Pain in right knee: Secondary | ICD-10-CM | POA: Diagnosis not present

## 2020-02-29 DIAGNOSIS — R2689 Other abnormalities of gait and mobility: Secondary | ICD-10-CM

## 2020-02-29 DIAGNOSIS — M25661 Stiffness of right knee, not elsewhere classified: Secondary | ICD-10-CM

## 2020-02-29 DIAGNOSIS — R600 Localized edema: Secondary | ICD-10-CM

## 2020-02-29 DIAGNOSIS — G8929 Other chronic pain: Secondary | ICD-10-CM | POA: Diagnosis not present

## 2020-02-29 NOTE — Patient Instructions (Signed)
Access Code: [email or web address] URL: [email or web address] Date: 02/29/2020 Prepared by: Hilda Blades  Exercises Supine Quadriceps Stretch with Strap on Table - 2 x daily - 7 x weekly - 2 reps - 30 seconds hold Prone Quadriceps Stretch with Strap - 2 x daily - 7 x weekly - 2 reps - 30 seconds hold Seated Hamstring Stretch - 2 x daily - 7 x weekly - 2 reps - 30 seconds hold Standing Gastroc Stretch at Counter - 2 x daily - 7 x weekly - 2 reps - 30 seconds hold Active Straight Leg Raise with Quad Set - 1 x daily - 7 x weekly - 10 reps - 2 sets Bridge - 1 x daily - 7 x weekly - 2 sets - 10 reps Sidelying Hip Abduction - 1 x daily - 7 x weekly - 2 sets - 10 reps Seated Knee Extension with Resistance - 1 x daily - 7 x weekly - 2 sets - 15 reps Standing Hamstring Curl with Resistance - 1 x daily - 7 x weekly - 2 sets - 15 reps Heel rises with counter support - 1 x daily - 7 x weekly - 2 sets - 20 reps Squat with Chair Touch - 1 x daily - 7 x weekly - 2 sets - 10 reps Step Up - 1 x daily - 7 x weekly - 2 sets - 10 reps Lateral Step Up - 1 x daily - 7 x weekly - 2 sets - 10 reps Single Leg Stance with Support - 1 x daily - 7 x weekly - 4 reps - 20 seconds hold

## 2020-02-29 NOTE — Therapy (Signed)
[place], [place], [address] Phone: [phone]   Fax:  [phone]  Physical Therapy Treatment / Discharge  Patient Details  Name: Deborah [name] MRN: [id number] Date of Birth: 12/26/1955 Referring Provider (PT): Aundra Dubin, [place]   Encounter Date: 02/29/2020   PT End of Session - 02/29/20 0832    Visit Number 13    Number of Visits 13    Date for PT Re-Evaluation 02/29/20    Authorization Type [employer]    Authorization Time Period 01/04/2020 - 02/29/2020    Authorization - Visit Number 12    Authorization - Number of Visits 16    Progress Note Due on Visit 20    PT Start Time 0825    PT Stop Time 0905    PT Time Calculation (min) 40 min    Activity Tolerance Patient tolerated treatment well    Behavior During Therapy [facility] for tasks assessed/performed           Past Medical History:  Diagnosis Date  . Anemia    h/o of  . Arthritis    in knees  . Asthma   . Breast cancer ([place])   . Bronchitis   . COPD (chronic obstructive pulmonary disease) ([place])   . Diverticulosis 11/15/2012    noted on screening colonoscopy   . Dyspnea    with COPD exerbation  . Esophageal stricture   . Former smoker 03/19/2011  . GERD (gastroesophageal reflux disease)   . Heart murmur    asymptomatic   . History of laryngectomy   . History of radiation therapy 11/15/18- 12/06/18   Right Breast total dose 42.56 Gy in 16 fractions.   [name] Hx of radiation therapy 09/03/10 to 10/16/2010   supraglottic larynx  . Hypertension   . 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 Internal hemorrhoid 11/15/2012    small, noted on screening colonoscopy   . 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  . Leukocytopenia   . Nausea alone 07/28/2013  . Neck pain 01/21/2012  . Normal MRI 07/14/11   negative for mestasis   . Pneumonia 2012  . Sciatica   . Seizures ([place])    07/24/11 off  Effexor w/o seizure  . Sepsis ([place]) 08/04/12  . Sinusitis, chronic 07/20/2011   Bilateral maxillary, identified on MRI of head 07/14/11.    . Tracheostomy dependent [facility])     Past Surgical History:  Procedure Laterality Date  . BREAST LUMPECTOMY Right 07/06/2018  . BREAST LUMPECTOMY WITH RADIOACTIVE SEED LOCALIZATION Right 07/06/2018   Procedure: RIGHT BREAST LUMPECTOMY WITH RADIOACTIVE SEED LOCALIZATION;  Surgeon: Rolm Bookbinder, MD;  Location: [place];  Service: General;  Laterality: Right;  . COLONOSCOPY N/A 11/15/2012   Procedure: COLONOSCOPY;  Surgeon: Lafayette Dragon, MD;  [facility];  Service: Endoscopy;  Laterality: N/A;  . DENTAL RESTORATION/EXTRACTION WITH X-RAY    . ESOPHAGEAL DILATION N/A 09/09/2019   Procedure: ESOPHAGEAL DILATION;  Surgeon: [name], MD;  Location: [place];  Service: ENT;  Laterality: N/A;  . ESOPHAGEAL DILATION N/A 10/05/2019   Procedure: ESOPHAGEAL DILATION;  Surgeon: [name], MD;  Location: [place];  Service: ENT;  Laterality: N/A;  via tracheostomy  . ESOPHAGOGASTRODUODENOSCOPY (EGD) WITH PROPOFOL N/A 08/12/2018   Procedure: ESOPHAGOGASTRODUODENOSCOPY (EGD) WITH PROPOFOL;  Surgeon: Doran Stabler, MD;  Location: [place];  Service: Gastroenterology;  Laterality: N/A;  .  ESOPHAGOSCOPY  06/21/2012   Procedure: ESOPHAGOSCOPY;  Surgeon: [name], MD;  Location: [place];  Service: ENT;  Laterality: N/A;  . ESOPHAGOSCOPY WITH DILITATION N/A 09/21/2014   Procedure: ESOPHAGOSCOPY WITH DILITATION;  Surgeon: [name], MD;  Location: [place];  Service: ENT;  Laterality: N/A;  . ESOPHAGOSCOPY WITH DILITATION N/A 07/04/2016   Procedure: ESOPHAGOSCOPY WITH DILITATION;  Surgeon: [name], MD;  Location: [place];  Service: ENT;  Laterality: N/A;  . ESOPHAGOSCOPY WITH DILITATION N/A 12/01/2017   Procedure: ESOPHAGOSCOPY WITH DILITATION;  Surgeon: [name], MD;  Location: [place];  Service: ENT;   Laterality: N/A;  . ESOPHAGOSCOPY WITH DILITATION N/A 04/19/2018   Procedure: ESOPHAGOSCOPY WITH DILITATION;  Surgeon: [name], MD;  Location: [place];  Service: ENT;  Laterality: N/A;  . ESOPHAGOSCOPY WITH DILITATION N/A 03/07/2019   Procedure: Esophagoscopy with dilatation;  Surgeon: [name], MD;  Location: [place];  Service: ENT;  Laterality: N/A;  . FLEXIBLE BRONCHOSCOPY  01/08/2018      . FOREIGN BODY REMOVAL BRONCHIAL  10/02/2011   Procedure: REMOVAL FOREIGN BODY BRONCHIAL;  Surgeon: Ruby Cola, MD;  Location: [place];  Service: ENT;  Laterality: N/A;  . FOREIGN BODY REMOVAL BRONCHIAL N/A 01/08/2018   Procedure: REMOVAL FOREIGN BODY BRONCHIAL;  Surgeon: Jodi Marble, MD;  Location[facility];  Service: ENT;  Laterality: N/A;  . LARYNGECTOMY    . Porta cath removal    . PORTACATH PLACEMENT  09/17/10   Tip in cavoatrial junction  . RE-EXCISION OF BREAST CANCER,SUPERIOR MARGINS Right 07/29/2018   Procedure: RE-EXCISION OF RIGHT BREAST MEDIAL MARGINS;  Surgeon: Rolm Bookbinder, MD;  Location: [place];  Service: General;  Laterality: Right;  . RIGID ESOPHAGOSCOPY N/A 03/19/2019   Procedure: FLEXIBLE ESOPHAGOSCOPY;  Surgeon: Jodi Marble, MD;  Location: [place];  Service: ENT;  Laterality: N/A;  . STOMAPLASTY N/A 10/21/2016   Procedure: Zola Button;  Surgeon: [name], MD;  Location: [place];  Service: ENT;  Laterality: N/A;  . TOTAL KNEE ARTHROPLASTY Right 12/12/2019   Procedure: RIGHT TOTAL KNEE ARTHROPLASTY;  Surgeon: Leandrew Koyanagi, MD;  Location: [place];  Service: Orthopedics;  Laterality: Right;  . TRACHEAL DILITATION  07/16/2011   Procedure: TRACHEAL DILITATION;  Surgeon: Beckie Salts, MD;  Location: [place];  Service: ENT;  Laterality: N/A;  dilation of tracheal stoma and replacement of stoma tube  . TUBAL LIGATION  1982    There were no vitals filed for this visit.   Subjective Assessment - 02/29/20 0831    Subjective Patient reports knee is feeling good. No new  issues and she is independent with her HEP.    How long can you sit comfortably? No limitation    How long can you stand comfortably? No limitation    How long can you walk comfortably? No limitation    Patient Stated Goals Get rid of the pain and back to walking    Currently in Pain? No/denies              [facility] PT Assessment - 02/29/20 0001      Assessment   Medical Diagnosis Right TKA    Referring Provider (PT) Aundra Dubin, PA-C    Onset Date/Surgical Date 12/12/19    Next MD Visit 03/06/2020      Precautions   Precautions None      Restrictions   Weight Bearing Restrictions No      Balance Screen   Has the patient  fallen in the past 6 months No    Has the patient had a decrease in activity level because of a fear of falling?  No    Is the patient reluctant to leave their home because of a fear of falling?  No      Prior Function   Level of Independence Independent      Cognition   Overall Cognitive Status Within Functional Limits for tasks assessed      Observation/Other Assessments   Observations Patient appears in no apparent distress    Focus on Therapeutic Outcomes (FOTO)  NA - MCD      Single Leg Stance   Comments > 30 sec bilaterally      Sit to Stand   Comments WFL      AROM   Right Knee Extension -1   lacking   Right Knee Flexion 126      Strength   Right Hip Flexion 4/5    Right Hip Extension 4/5    Right Hip ABduction 4/5    Right Knee Flexion 5/5    Right Knee Extension 5/5      Flexibility   Hamstrings WFL    Quadriceps WFL      Transfers   Transfers Independent with all Transfers      Ambulation/Gait   Ambulation/Gait Yes    Ambulation/Gait Assistance 7: Independent    Gait Pattern Within Functional Limits                         [facility] Adult PT Treatment/Exercise - 02/29/20 0001      Self-Care   Self-Care Other Self-Care Comments    Other Self-Care Comments  POC, HEP, walking consistency      Exercises    Exercises Knee/Hip   review current HEP program     Knee/Hip Exercises: Stretches   Passive Hamstring Stretch 2 reps;30 seconds    Passive Hamstring Stretch Limitations seated edge of chair    Quad Stretch 2 reps;30 seconds    Quad Stretch Limitations prone with strap    Hip Flexor Stretch 2 reps;30 seconds    Hip Flexor Stretch Limitations supine edge of mat    Gastroc Stretch 2 reps;30 seconds    Gastroc Stretch Limitations slant board      Knee/Hip Exercises: Aerobic   Recumbent Bike L3 x 5 min      Knee/Hip Exercises: Machines for Strengthening   Cybex Knee Extension 35# 2x10    Cybex Knee Flexion 35# 2x10    Cybex Leg Press 110# 2x10      Knee/Hip Exercises: Standing   Forward Step Up 2 sets;10 reps    Forward Step Up Limitations 8" step height    SLS 2 x 30 sec each    Other Standing Knee Exercises Lateral band walk with blue at knees 3x15                  PT Education - 02/29/20 0832    Education Details POC, HEP    Person(s) Educated Patient    Methods Explanation    Comprehension Verbalized understanding            PT Short Term Goals - 02/22/20 0835      PT SHORT TERM GOAL #1   Title Patient will be I with initial HEP to progress with PT    Baseline Patient independent - 02/22/2020    Time 3    Period [place]  Status Achieved    Target Date 01/18/20      PT SHORT TERM GOAL #2   Title Patient will exhibit improved AROM knee flexion to >/= 110 deg and knee extension to </= 5 deg lacking from neutral to improve gait and transfers    Baseline Flexion: 120, Extension: 4 lacking - 01/12/2020    Time 3    Period Weeks    Status Achieved    Target Date 01/18/20      PT SHORT TERM GOAL #3   Title Patient will report </= 6/10 pain level with activity to improve functional ability    Baseline 4/10 pain level - 01/16/2020    Time 3    Period Weeks    Status Achieved    Target Date 01/18/20             PT Long Term Goals - 02/29/20 0846      PT  LONG TERM GOAL #1   Title Patient will be I with final HEP to maintain progress from PT    Baseline Patient independent with HEP    Status Achieved      PT LONG TERM GOAL #2   Title Patient will demonstrated right knee AROM WFL to all for normalized transfers and gait    Baseline 1-126 deg    Status Achieved      PT LONG TERM GOAL #3   Title Patient will exhibit right knee strength grossly 5/5 to improve stair negotiation and activity tolerance    Baseline Patient exhibits 5/5 strength    Status Achieved      PT LONG TERM GOAL #4   Title Patient will report no limitation with walking or standing    Baseline Patient denies limitation with standing or walking    Status Achieved                 Plan - 02/29/20 0838    Clinical Impression Statement Patient tolerated therapy well with no adverse effects. Patient has achieved all established goals and is independent with HEP. She denies any functional limitation or difficulties at home. She will be discharged from formal PT to independent HEP.    PT Treatment/Interventions ADLs/Self Care Home Management;Cryotherapy;Electrical Stimulation;Moist Heat;Neuromuscular re-education;Balance training;Therapeutic exercise;Therapeutic activities;Functional mobility training;Stair training;Gait training;Patient/family education;Manual techniques;Dry needling;Passive range of motion;Taping;Vasopneumatic Device;Joint Manipulations    PT Next Visit Plan NA - discharge    PT Home Exercise Plan [email or web address]: heel prop, standing calf stretch with strap, supine heel slide with strap, seated hamstring stretch, seated knee AROM; SLR, hip abduction, hip extension, bridge, banded LAQ and standing hamstring curl, heel raises, sit<>stand, SLS    Consulted and Agree with Plan of Care Patient             Visit Diagnosis: Chronic pain of right knee  Stiffness of right knee, not elsewhere classified  Muscle weakness (generalized)  Other abnormalities of  gait and mobility  Localized edema     Problem List Patient Active Problem List   Diagnosis Date Noted  . Status post total right knee replacement 12/12/2019  . Primary osteoarthritis of right knee 12/11/2019  . Epidermal cyst of neck 07/21/2019  . Localized swelling, mass and lump, neck 07/20/2019  . Allergic rhinitis 05/20/2019  . GERD (gastroesophageal reflux disease)   . COPD (chronic obstructive pulmonary disease) ([place])   . Chronic diastolic (congestive) heart failure ([place])   . Foreign body aspiration   . Breast cancer ([place]) 07/06/2018  . 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[place]) 04/08/2018  . Carpal tunnel syndrome 03/16/2018  . Obesity 08/24/2017  . Plantar fasciitis 06/06/2016  . Low back pain 05/21/2016  . Health care maintenance 12/29/2015  . Osteoarthritis of knees, bilateral 05/31/2015  . Knee pain 01/04/2015  . Dysphagia 08/16/2014  . Weight gain 08/05/2014  . DOE (dyspnea on exertion) 04/28/2014  . Frequent falls 12/23/2013  . Bilateral shoulder pain 04/07/2013  . Anemia in chronic illness 08/11/2012  . Status post trachelectomy 08/04/2012  . Chronic pain 01/20/2012  . History of head and neck cancer 09/26/2011  . Hx of radiation therapy   . Tracheitis 07/17/2011  . Seizure ([place]) 07/17/2011  . Hypothyroidism 03/19/2011  . Essential hypertension 03/19/2011  . 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  Patient goals were met. Patient is being discharged due to meeting the stated rehab goals.  ?????     Hilda Blades, PT, DPT, LAT, ATC 02/29/20  9:13 AM Phone: [phone] Fax: [place]  [facility] [address] [place], [place], [address] Phone: [phone]   Fax:  [phone]  Name: Deborah [name] MRN: [id number] Date of Birth: Feb 28, 1956

## 2020-03-05 ENCOUNTER — Encounter (HOSPITAL_BASED_OUTPATIENT_CLINIC_OR_DEPARTMENT_OTHER): Payer: Self-pay | Admitting: Otolaryngology

## 2020-03-06 ENCOUNTER — Ambulatory Visit (INDEPENDENT_AMBULATORY_CARE_PROVIDER_SITE_OTHER): Payer: Medicaid Other | Admitting: Physician Assistant

## 2020-03-06 ENCOUNTER — Encounter: Payer: Self-pay | Admitting: Orthopaedic Surgery

## 2020-03-06 DIAGNOSIS — C329 Malignant neoplasm of larynx, unspecified: Secondary | ICD-10-CM | POA: Diagnosis not present

## 2020-03-06 DIAGNOSIS — Z96651 Presence of right artificial knee joint: Secondary | ICD-10-CM

## 2020-03-06 DIAGNOSIS — Z930 Tracheostomy status: Secondary | ICD-10-CM | POA: Diagnosis not present

## 2020-03-06 DIAGNOSIS — C321 Malignant neoplasm of supraglottis: Secondary | ICD-10-CM | POA: Diagnosis not present

## 2020-03-06 NOTE — Progress Notes (Signed)
Post-Op Visit Note   Patient: Deborah [name]           Date of Birth: 26-Jun-1955           MRN: [id number] Visit Date: 03/06/2020 PCP: Benay Pike, MD   Assessment & Plan:  Chief Complaint:  Chief Complaint  Patient presents with   Right Knee - Pain   Visit Diagnoses:  1. H/O total knee replacement, right     Plan: Patient is a pleasant 64 year old female comes in today 3 months out right total knee replacement.  She has been doing very well.  She still notes some pain to the medial aspect but this continues to improve.  She has finished physical therapy and continues to work on him exercise program.  Examination of the right knee shows a fully healed surgical scar.  Range of motion 0 to 125 degrees.  Stable to valgus varus stress.  She is neurovascular intact distally.  At this point, we we will allow her to return to work full duty without restrictions.  Dental prophylaxis reinforced.  Follow-up with [place] in 3 months time for repeat evaluation and 2 view x-rays of the right knee.  Call with concerns or questions.  Follow-Up Instructions: Return in about 3 months (around 06/05/2020).   Orders:  No orders of the defined types were placed in this encounter.  No orders of the defined types were placed in this encounter.   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[place])  04/08/2018   Carpal tunnel syndrome 03/16/2018   Obesity 08/24/2017   Plantar fasciitis 06/06/2016   Low back pain 05/21/2016   Health care maintenance 12/29/2015   Osteoarthritis of knees, bilateral 05/31/2015   Knee pain 01/04/2015   Dysphagia 08/16/2014   Weight gain 08/05/2014   DOE (dyspnea on exertion) 04/28/2014   Frequent falls 12/23/2013   Bilateral shoulder pain 04/07/2013   Anemia in chronic illness 08/11/2012   Status post trachelectomy 08/04/2012   Chronic pain 01/20/2012   History of head and neck cancer 09/26/2011   Hx of radiation therapy    Tracheitis 07/17/2011   Seizure ([place]) 07/17/2011   Hypothyroidism 03/19/2011   Essential hypertension 03/19/2011   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   Leukocytopenia    Nausea alone 07/28/2013   Neck pain 01/21/2012   Normal MRI 07/14/11   negative for mestasis    Pneumonia 2012   Sciatica    Seizures ([place])    07/24/11 off Effexor w/o seizure   Sepsis ([place]) 08/04/12   Sinusitis, chronic 07/20/2011   Bilateral maxillary, identified on MRI of head 07/14/11.      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[place])     Family History  Problem Relation Age of Onset   Heart disease Mother    Heart disease Father    Heart disease Sister    Cancer Brother        type unknown   Liver disease Maternal Grandmother    Cancer Sister        type unknown   Diabetes Sister    Breast cancer Neg Hx     Past Surgical History:  Procedure Laterality Date   BREAST LUMPECTOMY Right 07/06/2018   BREAST LUMPECTOMY WITH RADIOACTIVE SEED LOCALIZATION Right 07/06/2018   Procedure: RIGHT BREAST LUMPECTOMY WITH RADIOACTIVE SEED LOCALIZATION;  Surgeon: Rolm Bookbinder, MD;  Location: [facility];  Service: General;  Laterality: Right;   COLONOSCOPY N/A 11/15/2012   Procedure: COLONOSCOPY;  Surgeon: Lafayette Dragon, MD;  Location: [facility];  Service: Endoscopy;  Laterality: N/A;   DENTAL RESTORATION/EXTRACTION WITH X-RAY     ESOPHAGEAL DILATION N/A 09/09/2019   Procedure: ESOPHAGEAL DILATION;  Surgeon: [name], MD;  Location: [facility];  Service: ENT;  Laterality: N/A;   ESOPHAGEAL DILATION N/A 10/05/2019   Procedure: ESOPHAGEAL DILATION;  Surgeon: [name], MD;  Location: [facility];  Service: ENT;  Laterality: N/A;  via tracheostomy   ESOPHAGEAL DILATION N/A 02/27/2020   Procedure: ESOPHAGEAL DILATION;  Surgeon: [name], MD;  Location: [facility];  Service: ENT;  Laterality: N/A;   ESOPHAGOGASTRODUODENOSCOPY (EGD) WITH PROPOFOL N/A 08/12/2018   Procedure: ESOPHAGOGASTRODUODENOSCOPY (EGD) WITH PROPOFOL;  Surgeon: Doran Stabler, MD;  Location: [facility];  Service: Gastroenterology;  Laterality: N/A;   ESOPHAGOSCOPY  06/21/2012   Procedure: ESOPHAGOSCOPY;  Surgeon: [name], MD;  Location: [facility];  Service: ENT;  Laterality: N/A;   ESOPHAGOSCOPY WITH DILITATION N/A 09/21/2014   Procedure: ESOPHAGOSCOPY WITH DILITATION;  Surgeon: [name], MD;  Location: [facility];  Service: ENT;  Laterality: N/A;    ESOPHAGOSCOPY WITH DILITATION N/A 07/04/2016   Procedure: ESOPHAGOSCOPY WITH DILITATION;  Surgeon: [name], MD;  Location: [facility];  Service: ENT;  Laterality: N/A;   ESOPHAGOSCOPY WITH DILITATION N/A 12/01/2017   Procedure: ESOPHAGOSCOPY WITH DILITATION;  Surgeon: [name], MD;  Location: [facility];  Service: ENT;  Laterality: N/A;   ESOPHAGOSCOPY WITH DILITATION N/A 04/19/2018   Procedure: ESOPHAGOSCOPY WITH DILITATION;  Surgeon: [name], MD;  Location: [facility];  Service: ENT;  Laterality: N/A;   ESOPHAGOSCOPY WITH DILITATION N/A 03/07/2019   Procedure: Esophagoscopy with dilatation;  Surgeon: [name], MD;  Location: [facility];  Service: ENT;  Laterality: N/A;   FLEXIBLE BRONCHOSCOPY  01/08/2018       FOREIGN BODY REMOVAL BRONCHIAL  10/02/2011  Procedure: REMOVAL FOREIGN BODY BRONCHIAL;  Surgeon: Ruby Cola, MD;  [facility];  Service: ENT;  Laterality: N/A;   FOREIGN BODY REMOVAL BRONCHIAL N/A 01/08/2018   Procedure: REMOVAL FOREIGN BODY BRONCHIAL;  Surgeon: Jodi Marble, MD;  Location: [facility];  Service: ENT;  Laterality: N/A;   LARYNGECTOMY     Porta cath removal     PORTACATH PLACEMENT  09/17/10   Tip in cavoatrial junction   RE-EXCISION OF BREAST CANCER,SUPERIOR MARGINS Right 07/29/2018   Procedure: RE-EXCISION OF RIGHT BREAST MEDIAL MARGINS;  Surgeon: Rolm Bookbinder, MD;  Location: [facility];  Service: General;  Laterality: Right;   RIGID ESOPHAGOSCOPY N/A 03/19/2019   Procedure: FLEXIBLE ESOPHAGOSCOPY;  Surgeon: Jodi Marble, MD;  Location: [facility];  Service: ENT;  Laterality: N/A;   STOMAPLASTY N/A 10/21/2016   Procedure: Zola Button;  Surgeon: [name], MD;  Location: [facility];  Service: ENT;  Laterality: N/A;   TOTAL KNEE ARTHROPLASTY Right 12/12/2019   Procedure: RIGHT TOTAL KNEE ARTHROPLASTY;  Surgeon: Leandrew Koyanagi, MD;  Location: [facility];  Service: Orthopedics;  Laterality: Right;   TRACHEAL DILITATION  07/16/2011   Procedure: TRACHEAL  DILITATION;  Surgeon: Beckie Salts, MD;  Location: [facility];  Service: ENT;  Laterality: N/A;  dilation of tracheal stoma and replacement of stoma tube   TUBAL LIGATION  1982   Social History   Occupational History   Occupation: part time    Comment: warehouse work  Tobacco Use   Smoking status: Former Smoker    Packs/day: 2.00    Years: 20.00    Pack years: 40.00    Types: Cigarettes    Quit date: 09/17/2010    Years since quitting: 9.4   Smokeless tobacco: Never Used  Vaping Use   Vaping Use: Never used  Substance and Sexual Activity   Alcohol use: Yes    Comment: Socially drinks    Drug use: No    Comment: tried cocaine 1 time 2010 only used 1 time   Sexual activity: Yes    Birth control/protection: Surgical

## 2020-03-16 ENCOUNTER — Ambulatory Visit (INDEPENDENT_AMBULATORY_CARE_PROVIDER_SITE_OTHER): Payer: Medicaid Other

## 2020-03-16 ENCOUNTER — Other Ambulatory Visit: Payer: Self-pay

## 2020-03-16 DIAGNOSIS — Z23 Encounter for immunization: Secondary | ICD-10-CM

## 2020-03-16 NOTE — Progress Notes (Signed)
Patient presents in nurse clinic for Flu Vaccine.   Vaccine administered LD without complication.   See admin for details.

## 2020-03-27 ENCOUNTER — Other Ambulatory Visit: Payer: Self-pay

## 2020-03-27 ENCOUNTER — Ambulatory Visit (INDEPENDENT_AMBULATORY_CARE_PROVIDER_SITE_OTHER): Payer: Medicaid Other | Admitting: Sports Medicine

## 2020-03-27 VITALS — BP 191/86 | Ht 67.25 in | Wt 213.0 lb

## 2020-03-27 DIAGNOSIS — M1712 Unilateral primary osteoarthritis, left knee: Secondary | ICD-10-CM

## 2020-03-27 DIAGNOSIS — Z96651 Presence of right artificial knee joint: Secondary | ICD-10-CM | POA: Diagnosis not present

## 2020-03-27 MED ORDER — METHYLPREDNISOLONE ACETATE 40 MG/ML IJ SUSP
40.0000 mg | Freq: Once | INTRAMUSCULAR | Status: AC
  Administered 2020-03-27: 40 mg via INTRA_ARTICULAR

## 2020-03-27 NOTE — Progress Notes (Signed)
Office Visit Note   Patient: Deborah [name]           Date of Birth: 08/31/1955           MRN: [id number] Visit Date: 03/27/2020 Requested by: Benay Pike, MD [phone],  [place] [address] PCP: Benay Pike, MD  Subjective: CC: Bilateral Knee Pain, s/p R knee TKA  HPI: 64yo F presenting to clinic with concerns of bilateral knee pain. Patient is approximately 2.61mo from TKA on the right, and states she continues to have pain on the medial aspect- which is worse with prolong standing at work. She denies swelling, bruising, or redness to the area, and no new trauma. She states that it has improved since the initial surgery, but she is frustrated that it still hurts this far out.  Additionally, she states her left knee has been hurting lately- which she also feels is due to increasing standing at work, and limping after her knee replacement on the right. States her knee will swell after a long day at work. She has had steroid injections in the past with good success, and wants to know if she can have one today. She is otherwise doing well, with no additional concerns.               ROS:   All other systems were reviewed and are negative.  Objective: Vital Signs: BP (!) 191/86   Ht 5' 7.25" (1.708 m)   Wt 213 lb (96.6 kg)   BMI 33.11 kg/m   Physical Exam:  General:  Alert and oriented, in no acute distress. Pulm:  Breathing unlabored. Psy:  Normal mood, congruent affect. Skin:  Right knee with linear surgical incision. Appears to be healing well, with no surrounding erythema. Left knee skin intact, no bruising, no rashes, no lesions.  MSK:  General: antalgic gait, favoring right knee.  Standing exam: Mild valgus deformity of the knee with standing.   Seated Exam:  Moderate patellar crepitus with knee flexion/extension on left.   Palpation: Tenderness to palpation over medial joint line on right. No appreciable effusion. No warmth/erythema.  Left knee  with tenderness to palpation on both medial and lateral joint lines. Trace effusion. Tenderness with palpation of patella and patellar facets.    Left knee Ligamentous Exam:  No pain or laxity with anterior/posterior drawer.  No obvious Sag.  No pain or laxity with varus/valgus stress across the knee.   Meniscus:  [place] with no pain or deep clicking.   Imaging: None Today.  Assessment & Plan: 64 year old female presenting to clinic with concerns of bilateral knee pain. She is approximately 2 and half months status post a right knee replacement, and is frustrated that she continues to have pain on the medial aspect. -Discussed that she is still close to her surgery date, and it is not surprising that she continues to have some discomfort in this area. No red flags on examination, and no new trauma. Encouraged to continue with her physical therapy exercises, as well as icing if needed for pain control. -Follow-up with orthopedic surgeon as previously scheduled.  Left knee with crepitus with flexion and extension, as well as pain on medial and lateral joint lines. Consistent with history of osteoarthritis. Patient requesting to try injection therapy today. Risks and benefits discussed, and patient agreed to proceed. -Injection procedure performed as described below, which patient tolerated very well. She voiced immediate improvement in her symptoms following procedure. -Strict return precautions  were discussed.  Patient had no further questions or concerns today.     Procedures: Left Knee Cortisone Injection:  Risks and benefits of procedure discussed, Patient opted to proceed. Written Consent obtained.  Timeout performed.  Skin prepped in a sterile fashion with betadine before further cleansing with alcohol. Ethyl Chloride was used for topical analgesia.  Left Knee was injected with 4cc 1% Lidocaine without epinephrine and 40mg  methylprednisolone (1cc) via the suprapatellar approach  using a 25G, 1.5in needle.   Patient tolerated the injection well with no immediate complications. Aftercare instructions were discussed, and patient was given strict return precautions.   Patient seen and evaluated with the sports medicine fellow.  I agree with the above plan of care.  Patient's left knee was injected with cortisone today.  She will continue to follow-up with orthopedics for her recent right total knee arthroplasty.  Follow-up with me as needed.

## 2020-03-28 ENCOUNTER — Encounter: Payer: Self-pay | Admitting: Sports Medicine

## 2020-04-03 DIAGNOSIS — Z8521 Personal history of malignant neoplasm of larynx: Secondary | ICD-10-CM | POA: Diagnosis not present

## 2020-04-03 DIAGNOSIS — I10 Essential (primary) hypertension: Secondary | ICD-10-CM | POA: Diagnosis not present

## 2020-04-03 DIAGNOSIS — Z79899 Other long term (current) drug therapy: Secondary | ICD-10-CM | POA: Diagnosis not present

## 2020-05-15 ENCOUNTER — Other Ambulatory Visit: Payer: Self-pay | Admitting: Family Medicine

## 2020-05-15 DIAGNOSIS — Z853 Personal history of malignant neoplasm of breast: Secondary | ICD-10-CM

## 2020-05-17 ENCOUNTER — Ambulatory Visit (INDEPENDENT_AMBULATORY_CARE_PROVIDER_SITE_OTHER): Payer: Medicaid Other | Admitting: Family Medicine

## 2020-05-17 ENCOUNTER — Other Ambulatory Visit: Payer: Self-pay

## 2020-05-17 VITALS — BP 166/100 | HR 84

## 2020-05-17 DIAGNOSIS — I10 Essential (primary) hypertension: Secondary | ICD-10-CM | POA: Diagnosis not present

## 2020-05-17 DIAGNOSIS — J441 Chronic obstructive pulmonary disease with (acute) exacerbation: Secondary | ICD-10-CM | POA: Diagnosis not present

## 2020-05-17 MED ORDER — PREDNISONE 20 MG PO TABS: 40.0000 mg | ORAL_TABLET | Freq: Every day | ORAL | 0 refills | Status: AC

## 2020-05-17 MED ORDER — AMOXICILLIN-POT CLAVULANATE 875-125 MG PO TABS: 1.0000 | ORAL_TABLET | Freq: Two times a day (BID) | ORAL | 0 refills | Status: AC

## 2020-05-17 NOTE — Patient Instructions (Signed)
It was a pleasure to see you today!  1. You most likely have an exacerbation of your COPD. We will treat with you steroids and antibiotics (see below). If you do not have improvement by Monday, please schedule a follow up appointment next week. If you have any trouble breathing, fever (temperature greater than 100.4*F), worsening shortness of breath, please go to the nearest emergency department. If you are unsure what to do, you can call us at [phone] and we have an emergency answering service at night. - please take prednisone 40 mg (2 pills) once daily for 5 days - please take augmentin once in the morning and once at night for 5 days  2. For your elevated blood pressure: the goal is to keep your blood pressure at less than 130/80. When you are not sick, I recommend you schedule a follow up with your PCP, Dr. Jeannine Kitten, to discuss maximizing your blood pressure therapy.  Be Well,  Dr. Chauncey Reading

## 2020-05-17 NOTE — Assessment & Plan Note (Signed)
Moderate COPD exacerbation. Some dyspnea, increased sputum production and cough. Normal respiratory effort. - prednisone 40 mg qAM x5d - augmentin 875- BID x5d  RTC instructions given if not improved by Monday. Reviewed reasons to seek emergent care, see AVS.

## 2020-05-17 NOTE — Progress Notes (Signed)
    SUBJECTIVE:   CHIEF COMPLAINT / HPI:   Primary symptom: dyspnea, coughing, increased sputum production. Patient has tracheostomy, changes it daily, been in place since 2012. Using home medications dulera and spiriva, albuterol as needed. Duration:15 days Severity: moderate Fever? Tmax?: n/a Sick contacts: none Covid test: none Covid vaccination(s): pfizer 2 shot series in April 2021, planning for booster after mammogram in January  PERTINENT  PMH / PSH: COPD, larynx cancer s/p tracheostomy 2012  OBJECTIVE:  Nursing note and vitals reviewed BP (!) 166/100   Pulse 84   SpO2 93%   Gen: AA woman resting comfortably on chair, NAD HEENT: EOMI. Sclera without injection or icterus. MMM.  Neck: tracheostomy present in midline location, well healed edges, no erythema, edema or drainage Cardiac: Regular rate and rhythm. Normal S1/S2. No murmurs, rubs, or gallops appreciated. Lungs: coarse upper air way sounds due to trach, diminished air flow at the bases, no wheezes/rales/or rhonchi appreciated Neuro: AOx3, at baseline Psych: Pleasant and appropriate   ASSESSMENT/PLAN:   COPD exacerbation (HCC) Moderate COPD exacerbation. Some dyspnea, increased sputum production and cough. Normal respiratory effort. - prednisone 40 mg qAM x5d - augmentin 875- BID x5d  RTC instructions given if not improved by Monday. Reviewed reasons to seek emergent care, see AVS.   Essential hypertension Patient compliant with medications: amlodipine 10mg , lopressor 50mg  BID, losartan 100mg . BP elevated to 166/100 today, repeat BP at end of visit the same. Asymptomatic. Recommend patient follow up in 4-6 weeks when no longer sick to maximize BP control.     Gladys Damme, MD [place]

## 2020-05-17 NOTE — Assessment & Plan Note (Signed)
Patient compliant with medications: amlodipine 10mg , lopressor 50mg  BID, losartan 100mg . BP elevated to 166/100 today, repeat BP at end of visit the same. Asymptomatic. Recommend patient follow up in 4-6 weeks when no longer sick to maximize BP control.

## 2020-05-31 ENCOUNTER — Other Ambulatory Visit: Payer: Self-pay | Admitting: Family Medicine

## 2020-05-31 DIAGNOSIS — J069 Acute upper respiratory infection, unspecified: Secondary | ICD-10-CM

## 2020-06-05 ENCOUNTER — Ambulatory Visit: Payer: Medicaid Other | Admitting: Orthopaedic Surgery

## 2020-06-05 DIAGNOSIS — C329 Malignant neoplasm of larynx, unspecified: Secondary | ICD-10-CM | POA: Diagnosis not present

## 2020-06-12 ENCOUNTER — Encounter: Payer: Self-pay | Admitting: Orthopaedic Surgery

## 2020-06-12 ENCOUNTER — Ambulatory Visit: Payer: Self-pay

## 2020-06-12 ENCOUNTER — Ambulatory Visit (INDEPENDENT_AMBULATORY_CARE_PROVIDER_SITE_OTHER): Payer: Medicaid Other | Admitting: Orthopaedic Surgery

## 2020-06-12 ENCOUNTER — Other Ambulatory Visit: Payer: Self-pay

## 2020-06-12 VITALS — Ht 67.25 in | Wt 213.0 lb

## 2020-06-12 DIAGNOSIS — Z96651 Presence of right artificial knee joint: Secondary | ICD-10-CM

## 2020-06-12 NOTE — Progress Notes (Signed)
Post-Op Visit Note   Patient: Deborah [name]           Date of Birth: July 19, 1955           MRN: [id number] Visit Date: 06/12/2020 PCP: Benay Pike, MD   Assessment & Plan:  Chief Complaint:  Chief Complaint  Patient presents with   Right Knee - Follow-up    Right total knee arthroplasty 12/12/2019   Visit Diagnoses:  1. S/P total knee replacement, right     Plan: Patient is a pleasant 64 year old female who comes in today 6 months out right total knee replacement.  She has been doing well.  She still notes some soreness at times which is alleviated with ibuprofen.  Examination of her right knee shows range of motion from 0 to 125 degrees.  She is stable valgus varus stress.  Slight soreness to the MCL.  No proximal tibia tenderness.  She is neurovascular intact distally.  At this point, the patient will continue to dance with activity as tolerated.  Dental prophylaxis reinforced.  She will follow up with [place] in 6 months time for repeat evaluation and 2 view x-rays of the right knee.  Call with concerns or questions.  Follow-Up Instructions: Return in about 6 months (around 12/11/2020).   Orders:  Orders Placed This Encounter  Procedures   XR Knee 1-2 Views Right   No orders of the defined types were placed in this encounter.   Imaging: XR Knee 1-2 Views Right  Result Date: 06/12/2020 Well-seated prosthesis without complication   PMFS History: Patient Active Problem List   Diagnosis Date Noted   Status post total right knee replacement 12/12/2019   Primary osteoarthritis of right knee 12/11/2019   Epidermal cyst of neck 07/21/2019   Localized swelling, mass and lump, neck 07/20/2019   Allergic rhinitis 05/20/2019   GERD (gastroesophageal reflux disease)    COPD (chronic obstructive pulmonary disease) (HCC)    Chronic diastolic (congestive) heart failure ([place])    Foreign body aspiration    Breast cancer ([place]) 07/06/2018   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[place]) 04/08/2018   Carpal tunnel syndrome 03/16/2018   Obesity 08/24/2017   Plantar fasciitis 06/06/2016   Low back pain 05/21/2016   Health care maintenance 12/29/2015   Osteoarthritis of knees, bilateral 05/31/2015   Knee pain 01/04/2015   Dysphagia 08/16/2014   Weight gain 08/05/2014   COPD exacerbation ([place]) 04/29/2014   DOE (dyspnea on exertion) 04/28/2014   Frequent falls 12/23/2013   Bilateral shoulder pain 04/07/2013   Anemia in chronic illness 08/11/2012   Status post trachelectomy 08/04/2012   Chronic pain 01/20/2012   History of head and neck cancer 09/26/2011   Hx of radiation therapy    Tracheitis 07/17/2011   Seizure ([place]) 07/17/2011   Hypothyroidism 03/19/2011   Essential hypertension 03/19/2011   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   Leukocytopenia    Nausea alone 07/28/2013   Neck pain 01/21/2012   Normal MRI 07/14/11   negative for mestasis    Pneumonia 2012   Sciatica    Seizures ([place])    07/24/11 off Effexor w/o  seizure   Sepsis ([place]) 08/04/12   Sinusitis, chronic 07/20/2011   Bilateral maxillary, identified on MRI of head 07/14/11.     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[place])     Family History  Problem Relation Age of Onset   Heart disease Mother    Heart disease Father    Heart disease Sister    Cancer Brother        type unknown   Liver disease Maternal Grandmother    Cancer Sister        type unknown   Diabetes Sister    Breast cancer Neg Hx     Past Surgical History:  Procedure Laterality Date   BREAST LUMPECTOMY Right 07/06/2018   BREAST LUMPECTOMY WITH RADIOACTIVE SEED LOCALIZATION Right 07/06/2018   Procedure: RIGHT BREAST LUMPECTOMY WITH RADIOACTIVE SEED LOCALIZATION;  Surgeon: Rolm Bookbinder, MD;  Location: [facility];  Service: General;  Laterality: Right;   COLONOSCOPY N/A 11/15/2012   Procedure: COLONOSCOPY;  Surgeon: Lafayette Dragon, MD;  [facility];  Service: Endoscopy;  Laterality: N/A;   DENTAL RESTORATION/EXTRACTION WITH X-RAY     ESOPHAGEAL DILATION N/A 09/09/2019   Procedure: ESOPHAGEAL DILATION;  Surgeon: [name], MD;  Location: [facility];  Service: ENT;  Laterality: N/A;   ESOPHAGEAL DILATION N/A 10/05/2019   Procedure: ESOPHAGEAL DILATION;  Surgeon: [name], MD;  Location: [facility];  Service: ENT;  Laterality: N/A;  via tracheostomy   ESOPHAGEAL DILATION N/A 02/27/2020   Procedure: ESOPHAGEAL DILATION;  Surgeon: [name], MD;  Location: [facility];  Service: ENT;  Laterality: N/A;   ESOPHAGOGASTRODUODENOSCOPY (EGD) WITH PROPOFOL N/A 08/12/2018   Procedure: ESOPHAGOGASTRODUODENOSCOPY (EGD) WITH PROPOFOL;  Surgeon: Doran Stabler, MD;  Location: [facility];  Service: Gastroenterology;  Laterality: N/A;   ESOPHAGOSCOPY  06/21/2012   Procedure: ESOPHAGOSCOPY;  Surgeon: [name], MD;  Location: [facility];  Service: ENT;  Laterality: N/A;   ESOPHAGOSCOPY WITH DILITATION N/A  09/21/2014   Procedure: ESOPHAGOSCOPY WITH DILITATION;  Surgeon: [name], MD;  Location: [facility];  Service: ENT;  Laterality: N/A;   ESOPHAGOSCOPY WITH DILITATION N/A 07/04/2016   Procedure: ESOPHAGOSCOPY WITH DILITATION;  Surgeon: [name], MD;  Location: [facility];  Service: ENT;  Laterality: N/A;   ESOPHAGOSCOPY WITH DILITATION N/A 12/01/2017   Procedure: ESOPHAGOSCOPY WITH DILITATION;  Surgeon: [name], MD;  Location: [facility];  Service: ENT;  Laterality: N/A;   ESOPHAGOSCOPY WITH DILITATION N/A 04/19/2018   Procedure: ESOPHAGOSCOPY WITH DILITATION;  Surgeon: [name], MD;  Location: [facility];  Service: ENT;  Laterality: N/A;   ESOPHAGOSCOPY WITH DILITATION N/A 03/07/2019   Procedure: Esophagoscopy with dilatation;  Surgeon: [name], MD;  Location: [place] [facility];  Service: ENT;  Laterality: N/A;  FLEXIBLE BRONCHOSCOPY  01/08/2018       FOREIGN BODY REMOVAL BRONCHIAL  10/02/2011   Procedure: REMOVAL FOREIGN BODY BRONCHIAL;  Surgeon: Ruby Cola, MD;  [facility];  Service: ENT;  Laterality: N/A;   FOREIGN BODY REMOVAL BRONCHIAL N/A 01/08/2018   Procedure: REMOVAL FOREIGN BODY BRONCHIAL;  Surgeon: Jodi Marble, MD;  [facility];  Service: ENT;  Laterality: N/A;   LARYNGECTOMY     Porta cath removal     PORTACATH PLACEMENT  09/17/10   Tip in cavoatrial junction   RE-EXCISION OF BREAST CANCER,SUPERIOR MARGINS Right 07/29/2018   Procedure: RE-EXCISION OF RIGHT BREAST MEDIAL MARGINS;  Surgeon: Rolm Bookbinder, MD;  Location: [facility];  Service: General;  Laterality: Right;   RIGID ESOPHAGOSCOPY N/A 03/19/2019   Procedure: FLEXIBLE ESOPHAGOSCOPY;  Surgeon: Jodi Marble, MD;  Location: [place];  Service: ENT;  Laterality: N/A;   STOMAPLASTY N/A 10/21/2016   Procedure: Zola Button;  Surgeon: [name], MD;  Location: [facility];  Service: ENT;  Laterality: N/A;   TOTAL KNEE ARTHROPLASTY Right 12/12/2019   Procedure: RIGHT TOTAL KNEE ARTHROPLASTY;  Surgeon: Leandrew Koyanagi, MD;  Location: [facility];  Service: Orthopedics;  Laterality: Right;   TRACHEAL DILITATION  07/16/2011   Procedure: TRACHEAL DILITATION;  Surgeon: Beckie Salts, MD;  Location: [facility];  Service: ENT;  Laterality: N/A;  dilation of tracheal stoma and replacement of stoma tube   TUBAL LIGATION  1982   Social History   Occupational History   Occupation: part time    Comment: warehouse work  Tobacco Use   Smoking status: Former Smoker    Packs/day: 2.00    Years: 20.00    Pack years: 40.00    Types: Cigarettes    Quit date: 09/17/2010    Years since quitting: 9.7   Smokeless tobacco: Never Used  Vaping Use   Vaping Use: Never used  Substance and Sexual Activity   Alcohol use: Yes    Comment: Socially drinks    Drug use: No    Comment: tried cocaine 1 time 2010 only used 1 time   Sexual activity: Yes    Birth control/protection: Surgical

## 2020-06-14 DIAGNOSIS — Z963 Presence of artificial larynx: Secondary | ICD-10-CM | POA: Diagnosis not present

## 2020-06-14 DIAGNOSIS — Z79890 Hormone replacement therapy (postmenopausal): Secondary | ICD-10-CM | POA: Diagnosis not present

## 2020-06-14 DIAGNOSIS — Z448 Encounter for fitting and adjustment of other external prosthetic devices: Secondary | ICD-10-CM | POA: Diagnosis not present

## 2020-06-14 DIAGNOSIS — I10 Essential (primary) hypertension: Secondary | ICD-10-CM | POA: Diagnosis not present

## 2020-06-14 DIAGNOSIS — Z8521 Personal history of malignant neoplasm of larynx: Secondary | ICD-10-CM | POA: Diagnosis not present

## 2020-06-14 DIAGNOSIS — Z9002 Acquired absence of larynx: Secondary | ICD-10-CM | POA: Diagnosis not present

## 2020-06-19 DIAGNOSIS — C321 Malignant neoplasm of supraglottis: Secondary | ICD-10-CM | POA: Diagnosis not present

## 2020-06-19 DIAGNOSIS — C329 Malignant neoplasm of larynx, unspecified: Secondary | ICD-10-CM | POA: Diagnosis not present

## 2020-06-19 DIAGNOSIS — Z930 Tracheostomy status: Secondary | ICD-10-CM | POA: Diagnosis not present

## 2020-06-20 ENCOUNTER — Other Ambulatory Visit: Payer: Self-pay | Admitting: Adult Health

## 2020-06-20 DIAGNOSIS — Z853 Personal history of malignant neoplasm of breast: Secondary | ICD-10-CM

## 2020-06-22 ENCOUNTER — Telehealth: Payer: Self-pay | Admitting: Family Medicine

## 2020-06-22 ENCOUNTER — Other Ambulatory Visit: Payer: Self-pay

## 2020-06-22 ENCOUNTER — Ambulatory Visit
Admission: RE | Admit: 2020-06-22 | Discharge: 2020-06-22 | Disposition: A | Discharge status: Home / Self Care | Payer: Medicaid Other | Source: Ambulatory Visit | Attending: Internal Medicine | Admitting: Internal Medicine

## 2020-06-22 DIAGNOSIS — Z853 Personal history of malignant neoplasm of breast: Secondary | ICD-10-CM

## 2020-06-22 DIAGNOSIS — R922 Inconclusive mammogram: Secondary | ICD-10-CM | POA: Diagnosis not present

## 2020-06-22 NOTE — Telephone Encounter (Signed)
Patient walked in today saying that she has been without her Spiriva for 2 weeks and is in need of it.! She is wanting to script to be sent to [facility] [address] in [place]. She states she is in real bad need of it since it is getting colder. Thanks

## 2020-06-25 ENCOUNTER — Other Ambulatory Visit: Payer: Self-pay | Admitting: Family Medicine

## 2020-06-25 MED ORDER — TIOTROPIUM BROMIDE MONOHYDRATE 18 MCG IN CAPS
18.0000 ug | ORAL_CAPSULE | Freq: Every day | RESPIRATORY_TRACT | 11 refills | Status: DC | PRN
Start: 2020-06-25 — End: 2020-07-13

## 2020-06-26 DIAGNOSIS — K222 Esophageal obstruction: Secondary | ICD-10-CM | POA: Diagnosis not present

## 2020-06-26 DIAGNOSIS — Z923 Personal history of irradiation: Secondary | ICD-10-CM | POA: Diagnosis not present

## 2020-06-26 DIAGNOSIS — Z8521 Personal history of malignant neoplasm of larynx: Secondary | ICD-10-CM | POA: Diagnosis not present

## 2020-06-29 ENCOUNTER — Ambulatory Visit (INDEPENDENT_AMBULATORY_CARE_PROVIDER_SITE_OTHER): Payer: Medicaid Other

## 2020-06-29 ENCOUNTER — Telehealth: Payer: Self-pay

## 2020-06-29 ENCOUNTER — Other Ambulatory Visit: Payer: Self-pay

## 2020-06-29 DIAGNOSIS — Z01818 Encounter for other preprocedural examination: Secondary | ICD-10-CM | POA: Diagnosis not present

## 2020-06-29 DIAGNOSIS — Z23 Encounter for immunization: Secondary | ICD-10-CM | POA: Diagnosis not present

## 2020-06-29 NOTE — Progress Notes (Signed)
   Covid-19 Vaccination Clinic  Name:  Medea Deines    MRN: [id number] DOB: 09/15/1955  06/29/2020  Ms. Yaeger was observed post Covid-19 immunization for 15 minutes without incident. She was provided with Vaccine Information Sheet and instruction to access the V-Safe system.   Ms. Fellows was instructed to call 911 with any severe reactions post vaccine: [name] Difficulty breathing  . Swelling of face and throat  . A fast heartbeat  . A bad rash all over body  . Dizziness and weakness   Booster administered RD without complication.

## 2020-06-29 NOTE — Telephone Encounter (Signed)
Patient reports her albuterol neb solution is on back order. Originally patient had told me the only option was a "much higher dose." However, I spoke to the pharmacist and it is available in two doses, just not hers.   Option 1: 0.63 per 45ml (0.021%) Option 2: 2.5 per 0.15ml (0.5%)  Patient is usually on 0.08% for reference.   Please send in one option. I advised pharmacy the provider was ok initially with the "high dose" however she would not take a verbal.

## 2020-06-30 MED ORDER — ALBUTEROL SULFATE (5 MG/ML) 0.5% IN NEBU: 2.5000 mg | INHALATION_SOLUTION | Freq: Four times a day (QID) | RESPIRATORY_TRACT | 2 refills | Status: DC | PRN

## 2020-06-30 NOTE — Telephone Encounter (Signed)
2.5 per 0.23ml was sent in .

## 2020-07-03 DIAGNOSIS — Z8521 Personal history of malignant neoplasm of larynx: Secondary | ICD-10-CM | POA: Diagnosis not present

## 2020-07-03 DIAGNOSIS — Z448 Encounter for fitting and adjustment of other external prosthetic devices: Secondary | ICD-10-CM | POA: Diagnosis not present

## 2020-07-03 DIAGNOSIS — Z9002 Acquired absence of larynx: Secondary | ICD-10-CM | POA: Diagnosis not present

## 2020-07-10 ENCOUNTER — Other Ambulatory Visit: Payer: Self-pay | Admitting: Family Medicine

## 2020-07-10 NOTE — Telephone Encounter (Signed)
Patient calls nurse line regarding issues with albuterol nebulizer solution. Patient reports that dispensed solution amount is not enough for her breathing treatments. States that amount is "child sized" and she needs a larger volume of solution.   To PCP  Please advise  Talbot Grumbling, RN

## 2020-07-11 ENCOUNTER — Other Ambulatory Visit: Payer: Self-pay | Admitting: Family Medicine

## 2020-07-11 MED ORDER — ALBUTEROL SULFATE (5 MG/ML) 0.5% IN NEBU: 2.5000 mg | INHALATION_SOLUTION | Freq: Four times a day (QID) | RESPIRATORY_TRACT | 5 refills | Status: DC | PRN

## 2020-07-11 NOTE — H&P (Signed)
Deborah [name] is an 65 y.o. female.   Chief Complaint: Dysphagia HPI: Remote history of laryngectomy and radiation, having difficulty swallowing, needing serial esophagoscopy with dilatation.  Past Medical History:  Diagnosis Date  . Anemia    h/o of  . Arthritis    in knees  . Asthma   . Breast cancer ([place])   . Bronchitis   . COPD (chronic obstructive pulmonary disease) ([place])   . Diverticulosis 11/15/2012    noted on screening colonoscopy   . Dyspnea    with COPD exerbation  . Esophageal stricture   . Former smoker 03/19/2011  . GERD (gastroesophageal reflux disease)   . Heart murmur    asymptomatic   . History of laryngectomy   . History of radiation therapy 11/15/18- 12/06/18   Right Breast total dose 42.56 Gy in 16 fractions.   [name] Hx of radiation therapy 09/03/10 to 10/16/2010   supraglottic larynx  . Hypertension   . 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 Internal hemorrhoid 11/15/2012    small, noted on screening colonoscopy   . 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  . Leukocytopenia   . Nausea alone 07/28/2013  . Neck pain 01/21/2012  . Normal MRI 07/14/11   negative for mestasis   . Pneumonia 2012  . Sciatica   . Seizures ([place])    07/24/11 off Effexor w/o seizure  . Sepsis ([place]) 08/04/12  . Sinusitis, chronic 07/20/2011   Bilateral maxillary, identified on MRI of head 07/14/11.    . Tracheostomy dependent [facility])     Past Surgical History:  Procedure Laterality Date  . BREAST LUMPECTOMY Right 07/06/2018  . BREAST LUMPECTOMY WITH RADIOACTIVE SEED LOCALIZATION Right 07/06/2018   Procedure: RIGHT BREAST LUMPECTOMY WITH RADIOACTIVE SEED LOCALIZATION;  Surgeon: Rolm Bookbinder, MD;  Location: [facility];  Service: General;  Laterality: Right;  . COLONOSCOPY N/A 11/15/2012   Procedure: COLONOSCOPY;  Surgeon: Lafayette Dragon, MD;  [facility];  Service: Endoscopy;  Laterality: N/A;  . DENTAL RESTORATION/EXTRACTION WITH  X-RAY    . ESOPHAGEAL DILATION N/A 09/09/2019   Procedure: ESOPHAGEAL DILATION;  Surgeon: [name], MD;  Location: [facility];  Service: ENT;  Laterality: N/A;  . ESOPHAGEAL DILATION N/A 10/05/2019   Procedure: ESOPHAGEAL DILATION;  Surgeon: [name], MD;  Location: [facility];  Service: ENT;  Laterality: N/A;  via tracheostomy  . ESOPHAGEAL DILATION N/A 02/27/2020   Procedure: ESOPHAGEAL DILATION;  Surgeon: [name], MD;  Location: [place];  Service: ENT;  Laterality: N/A;  . ESOPHAGOGASTRODUODENOSCOPY (EGD) WITH PROPOFOL N/A 08/12/2018   Procedure: ESOPHAGOGASTRODUODENOSCOPY (EGD) WITH PROPOFOL;  Surgeon: Doran Stabler, MD;  Location: [place] [facility];  Service: Gastroenterology;  Laterality: N/A;  . ESOPHAGOSCOPY  06/21/2012   Procedure: ESOPHAGOSCOPY;  Surgeon: [name], MD;  Location: [place];  Service: ENT;  Laterality: N/A;  . ESOPHAGOSCOPY WITH DILITATION N/A 09/21/2014   Procedure: ESOPHAGOSCOPY WITH DILITATION;  Surgeon: [name], MD;  Location: [facility];  Service: ENT;  Laterality: N/A;  . ESOPHAGOSCOPY WITH DILITATION N/A 07/04/2016   Procedure: ESOPHAGOSCOPY WITH DILITATION;  Surgeon: [name], MD;  Location: [facility];  Service: ENT;  Laterality: N/A;  . ESOPHAGOSCOPY WITH DILITATION N/A 12/01/2017   Procedure: ESOPHAGOSCOPY WITH DILITATION;  Surgeon: [name], MD;  Location: [facility];  Service: ENT;  Laterality: N/A;  . ESOPHAGOSCOPY WITH DILITATION N/A 04/19/2018   Procedure: ESOPHAGOSCOPY WITH  DILITATION;  Surgeon: [name], MD;  Location: [facility];  Service: ENT;  Laterality: N/A;  . ESOPHAGOSCOPY WITH DILITATION N/A 03/07/2019   Procedure: Esophagoscopy with dilatation;  Surgeon: [name], MD;  Location: [place];  Service: ENT;  Laterality: N/A;  . FLEXIBLE BRONCHOSCOPY  01/08/2018      . FOREIGN BODY REMOVAL BRONCHIAL  10/02/2011   Procedure: REMOVAL FOREIGN BODY BRONCHIAL;  Surgeon:  Ruby Cola, MD;  Location: [facility];  Service: ENT;  Laterality: N/A;  . FOREIGN BODY REMOVAL BRONCHIAL N/A 01/08/2018   Procedure: REMOVAL FOREIGN BODY BRONCHIAL;  Surgeon: Jodi Marble, MD;  [facility];  Service: ENT;  Laterality: N/A;  . LARYNGECTOMY    . Porta cath removal    . PORTACATH PLACEMENT  09/17/10   Tip in cavoatrial junction  . RE-EXCISION OF BREAST CANCER,SUPERIOR MARGINS Right 07/29/2018   Procedure: RE-EXCISION OF RIGHT BREAST MEDIAL MARGINS;  Surgeon: Rolm Bookbinder, MD;  Location: [facility];  Service: General;  Laterality: Right;  . RIGID ESOPHAGOSCOPY N/A 03/19/2019   Procedure: FLEXIBLE ESOPHAGOSCOPY;  Surgeon: Jodi Marble, MD;  Location: [place];  Service: ENT;  Laterality: N/A;  . STOMAPLASTY N/A 10/21/2016   Procedure: Zola Button;  Surgeon: [name], MD;  Location: [facility];  Service: ENT;  Laterality: N/A;  . TOTAL KNEE ARTHROPLASTY Right 12/12/2019   Procedure: RIGHT TOTAL KNEE ARTHROPLASTY;  Surgeon: Leandrew Koyanagi, MD;  Location: [place];  Service: Orthopedics;  Laterality: Right;  . TRACHEAL DILITATION  07/16/2011   Procedure: TRACHEAL DILITATION;  Surgeon: Beckie Salts, MD;  Location: [facility];  Service: ENT;  Laterality: N/A;  dilation of tracheal stoma and replacement of stoma tube  . TUBAL LIGATION  1982    Family History  Problem Relation Age of Onset  . Heart disease Mother   . Heart disease Father   . Heart disease Sister   . Cancer Brother        type unknown  . Liver disease Maternal Grandmother   . Cancer Sister        type unknown  . Diabetes Sister   . Breast cancer Neg Hx    Social History:  reports that she quit smoking about 9 years ago. Her smoking use included cigarettes. She has a 40.00 pack-year smoking history. She has never used smokeless tobacco. She reports current alcohol use. She reports that she does not use drugs.  Allergies:  Allergies  Allergen Reactions  . Lisinopril Cough    No medications prior to admission.    No  results found for this or any previous visit (from the past 48 hour(s)). No results found.  ROS: otherwise negative  There were no vitals taken for this visit.  PHYSICAL EXAM: Overall appearance:  Healthy appearing, in no distress Head:  Normocephalic, atraumatic. Ears: External auditory canals are clear; tympanic membranes are intact and the middle ears are free of any effusion. Nose: External nose is healthy in appearance. Internal nasal exam free of any lesions or obstruction. Oral Cavity/pharynx:  There are no mucosal lesions or masses identified. Neuro:  No identifiable neurologic deficits. Neck: No palpable neck masses.  Stoma healthy and clear.  Studies Reviewed: none    Assessment/Plan Proceed with esophagoscopy and dilatation.  [name] 07/11/2020, 4:03 PM

## 2020-07-11 NOTE — Telephone Encounter (Signed)
I re-wrote the prescription for a greater quantity.

## 2020-07-12 ENCOUNTER — Other Ambulatory Visit (HOSPITAL_COMMUNITY): Payer: Medicaid Other

## 2020-07-13 ENCOUNTER — Encounter (HOSPITAL_BASED_OUTPATIENT_CLINIC_OR_DEPARTMENT_OTHER): Payer: Self-pay | Admitting: Otolaryngology

## 2020-07-13 ENCOUNTER — Other Ambulatory Visit (HOSPITAL_COMMUNITY)
Admission: RE | Admit: 2020-07-13 | Discharge: 2020-07-13 | Disposition: A | Discharge status: Home / Self Care | Payer: Medicaid Other | Source: Ambulatory Visit | Attending: Otolaryngology | Admitting: Otolaryngology

## 2020-07-13 ENCOUNTER — Other Ambulatory Visit: Payer: Self-pay

## 2020-07-13 DIAGNOSIS — Z20822 Contact with and (suspected) exposure to covid-19: Secondary | ICD-10-CM | POA: Insufficient documentation

## 2020-07-13 DIAGNOSIS — Z01812 Encounter for preprocedural laboratory examination: Secondary | ICD-10-CM | POA: Insufficient documentation

## 2020-07-13 LAB — SARS CORONAVIRUS 2 (TAT 6-24 HRS): SARS Coronavirus 2: NEGATIVE

## 2020-07-16 ENCOUNTER — Other Ambulatory Visit: Payer: Self-pay

## 2020-07-16 ENCOUNTER — Ambulatory Visit (HOSPITAL_BASED_OUTPATIENT_CLINIC_OR_DEPARTMENT_OTHER): Payer: Medicaid Other | Admitting: Anesthesiology

## 2020-07-16 ENCOUNTER — Ambulatory Visit (HOSPITAL_BASED_OUTPATIENT_CLINIC_OR_DEPARTMENT_OTHER)
Admission: RE | Admit: 2020-07-16 | Discharge: 2020-07-16 | Disposition: A | Discharge status: Home / Self Care | Payer: Medicaid Other | Attending: Otolaryngology | Admitting: Otolaryngology

## 2020-07-16 ENCOUNTER — Encounter (HOSPITAL_BASED_OUTPATIENT_CLINIC_OR_DEPARTMENT_OTHER): Payer: Self-pay | Admitting: Otolaryngology

## 2020-07-16 ENCOUNTER — Encounter (HOSPITAL_BASED_OUTPATIENT_CLINIC_OR_DEPARTMENT_OTHER)
Admission: RE | Disposition: A | Discharge status: Home / Self Care | Payer: Self-pay | Source: Home / Self Care | Attending: Otolaryngology

## 2020-07-16 DIAGNOSIS — Z87891 Personal history of nicotine dependence: Secondary | ICD-10-CM | POA: Insufficient documentation

## 2020-07-16 DIAGNOSIS — Z923 Personal history of irradiation: Secondary | ICD-10-CM | POA: Insufficient documentation

## 2020-07-16 DIAGNOSIS — Z9002 Acquired absence of larynx: Secondary | ICD-10-CM | POA: Diagnosis not present

## 2020-07-16 DIAGNOSIS — K219 Gastro-esophageal reflux disease without esophagitis: Secondary | ICD-10-CM | POA: Diagnosis not present

## 2020-07-16 DIAGNOSIS — Z930 Tracheostomy status: Secondary | ICD-10-CM | POA: Diagnosis not present

## 2020-07-16 DIAGNOSIS — K222 Esophageal obstruction: Secondary | ICD-10-CM | POA: Insufficient documentation

## 2020-07-16 DIAGNOSIS — Z888 Allergy status to other drugs, medicaments and biological substances status: Secondary | ICD-10-CM | POA: Diagnosis not present

## 2020-07-16 DIAGNOSIS — Z8521 Personal history of malignant neoplasm of larynx: Secondary | ICD-10-CM | POA: Insufficient documentation

## 2020-07-16 DIAGNOSIS — E039 Hypothyroidism, unspecified: Secondary | ICD-10-CM | POA: Diagnosis not present

## 2020-07-16 DIAGNOSIS — D638 Anemia in other chronic diseases classified elsewhere: Secondary | ICD-10-CM | POA: Diagnosis not present

## 2020-07-16 HISTORY — PX: ESOPHAGOSCOPY: SHX5534

## 2020-07-16 HISTORY — PX: ESOPHAGOSCOPY WITH DILITATION: SHX5618

## 2020-07-16 SURGERY — ESOPHAGOSCOPY, WITH DILATION
Anesthesia: General | Site: Throat

## 2020-07-16 MED ORDER — LIDOCAINE 2% (20 MG/ML) 5 ML SYRINGE
INTRAMUSCULAR | Status: DC | PRN
  Administered 2020-07-16: 80 mg via INTRAVENOUS

## 2020-07-16 MED ORDER — AMISULPRIDE (ANTIEMETIC) 5 MG/2ML IV SOLN: 10.0000 mg | Freq: Once | INTRAVENOUS | Status: DC | PRN

## 2020-07-16 MED ORDER — FENTANYL CITRATE (PF) 100 MCG/2ML IJ SOLN
INTRAMUSCULAR | Status: DC | PRN
  Administered 2020-07-16: 25 ug via INTRAVENOUS

## 2020-07-16 MED ORDER — DEXAMETHASONE SODIUM PHOSPHATE 10 MG/ML IJ SOLN
INTRAMUSCULAR | Status: DC | PRN
  Administered 2020-07-16: 10 mg via INTRAVENOUS

## 2020-07-16 MED ORDER — ONDANSETRON HCL 4 MG/2ML IJ SOLN
INTRAMUSCULAR | Status: DC | PRN
  Administered 2020-07-16: 4 mg via INTRAVENOUS

## 2020-07-16 MED ORDER — LIDOCAINE 2% (20 MG/ML) 5 ML SYRINGE
INTRAMUSCULAR | Status: AC
  Filled 2020-07-16: qty 5

## 2020-07-16 MED ORDER — OXYCODONE HCL 5 MG PO TABS
5.0000 mg | ORAL_TABLET | Freq: Once | ORAL | Status: DC | PRN
Start: 2020-07-16 — End: 2020-07-16

## 2020-07-16 MED ORDER — MIDAZOLAM HCL 5 MG/5ML IJ SOLN
INTRAMUSCULAR | Status: DC | PRN
  Administered 2020-07-16: 2 mg via INTRAVENOUS

## 2020-07-16 MED ORDER — PROPOFOL 10 MG/ML IV BOLUS
INTRAVENOUS | Status: DC | PRN
  Administered 2020-07-16: 150 mg via INTRAVENOUS
  Administered 2020-07-16: 50 mg via INTRAVENOUS

## 2020-07-16 MED ORDER — EPINEPHRINE PF 1 MG/ML IJ SOLN
INTRAMUSCULAR | Status: AC
  Filled 2020-07-16: qty 1

## 2020-07-16 MED ORDER — DEXAMETHASONE SODIUM PHOSPHATE 10 MG/ML IJ SOLN
INTRAMUSCULAR | Status: AC
  Filled 2020-07-16: qty 1

## 2020-07-16 MED ORDER — PROPOFOL 10 MG/ML IV BOLUS
INTRAVENOUS | Status: AC
  Filled 2020-07-16: qty 20

## 2020-07-16 MED ORDER — PROMETHAZINE HCL 25 MG/ML IJ SOLN
6.2500 mg | INTRAMUSCULAR | Status: DC | PRN
Start: 2020-07-16 — End: 2020-07-16

## 2020-07-16 MED ORDER — FENTANYL CITRATE (PF) 100 MCG/2ML IJ SOLN: 25.0000 ug | INTRAMUSCULAR | Status: DC | PRN

## 2020-07-16 MED ORDER — MIDAZOLAM HCL 2 MG/2ML IJ SOLN
INTRAMUSCULAR | Status: AC
  Filled 2020-07-16: qty 2

## 2020-07-16 MED ORDER — LACTATED RINGERS IV SOLN: INTRAVENOUS | Status: DC

## 2020-07-16 MED ORDER — ONDANSETRON HCL 4 MG/2ML IJ SOLN
INTRAMUSCULAR | Status: AC
  Filled 2020-07-16: qty 2

## 2020-07-16 MED ORDER — FENTANYL CITRATE (PF) 100 MCG/2ML IJ SOLN
INTRAMUSCULAR | Status: AC
  Filled 2020-07-16: qty 2

## 2020-07-16 MED ORDER — OXYCODONE HCL 5 MG/5ML PO SOLN: 5.0000 mg | Freq: Once | ORAL | Status: DC | PRN

## 2020-07-16 SURGICAL SUPPLY — 22 items
CANISTER SUCT 1200ML W/VALVE (MISCELLANEOUS) ×3 IMPLANT
CNTNR URN SCR LID CUP LEK RST (MISCELLANEOUS) ×4 IMPLANT
CONT SPEC 4OZ STRL OR WHT (MISCELLANEOUS) ×6
COVER WAND RF STERILE (DRAPES) IMPLANT
GAUZE SPONGE 4X4 12PLY STRL LF (GAUZE/BANDAGES/DRESSINGS) ×6 IMPLANT
GLOVE ECLIPSE 7.5 STRL STRAW (GLOVE) ×3 IMPLANT
GOWN STRL REUS W/ TWL LRG LVL3 (GOWN DISPOSABLE) ×2 IMPLANT
GOWN STRL REUS W/ TWL XL LVL3 (GOWN DISPOSABLE) IMPLANT
GOWN STRL REUS W/TWL LRG LVL3 (GOWN DISPOSABLE) ×3
GOWN STRL REUS W/TWL XL LVL3 (GOWN DISPOSABLE)
GUARD TEETH (MISCELLANEOUS) IMPLANT
NEEDLE HYPO 18GX1.5 BLUNT FILL (NEEDLE) ×3 IMPLANT
NEEDLE SPNL 22GX7 QUINCKE BK (NEEDLE) IMPLANT
NS IRRIG 1000ML POUR BTL (IV SOLUTION) ×3 IMPLANT
PACK BASIN DAY SURGERY FS (CUSTOM PROCEDURE TRAY) ×3 IMPLANT
PATTIES SURGICAL .5 X3 (DISPOSABLE) ×3 IMPLANT
SHEET MEDIUM DRAPE 40X70 STRL (DRAPES) ×3 IMPLANT
SURGILUBE 2OZ TUBE FLIPTOP (MISCELLANEOUS) ×6 IMPLANT
SYR 5ML LL (SYRINGE) ×3 IMPLANT
SYR CONTROL 10ML LL (SYRINGE) IMPLANT
TOWEL GREEN STERILE FF (TOWEL DISPOSABLE) ×3 IMPLANT
TUBE CONNECTING 20X1/4 (TUBING) ×3 IMPLANT

## 2020-07-16 NOTE — Anesthesia Procedure Notes (Signed)
Procedure Name: Intubation Date/Time: 07/16/2020 9:29 AM Performed by: Gwyndolyn Saxon, CRNA Pre-anesthesia Checklist: Patient identified, Emergency Drugs available, Suction available and Patient being monitored Patient Re-evaluated:Patient Re-evaluated prior to induction Oxygen Delivery Method: Circle system utilized Preoxygenation: Pre-oxygenation with 100% oxygen Induction Type: IV induction Tube size: 6.0 mm Number of attempts: 1 Placement Confirmation: positive ETCO2 and breath sounds checked- equal and bilateral Tube secured with: Tape

## 2020-07-16 NOTE — Op Note (Signed)
OPERATIVE REPORT  DATE OF SURGERY: 07/16/2020  PATIENT:  Deborah [name],  65 y.o. female  PRE-OPERATIVE DIAGNOSIS:  Esophageal stricture  POST-OPERATIVE DIAGNOSIS: Esophageal stricture  PROCEDURE:  Procedure(s): ESOPHAGOSCOPY WITH DILATION  SURGEON:  Beckie Salts, MD  ASSISTANTS: None  ANESTHESIA:   General   EBL: 0 ml  DRAINS: None  LOCAL MEDICATIONS USED:  None  SPECIMEN:  none  COUNTS:  Correct  PROCEDURE DETAILS: The patient was taken to the operating room and placed on the operating table in the supine position. Following induction of general endotracheal anesthesia, via the laryngectomy stoma, the patient was then draped in a standard fashion for esophagoscopy.  The rigid cervical esophagoscope was entered into the oral cavity and used to inspect the nasopharynx.  I was not able to pass it into the esophagus due to the stricture.  A Jako laryngoscope was then used for the remainder of the case.  The savory dilators were used starting at a #21 [place] and serially dilating up to 43 [place] were passed either through the laryngoscope or around it depending on the size of the dilator.  There was no bleeding.  There is nice stretching of the stricture site.  The scope was removed and the patient was awakened extubated and transferred to recovery in stable condition.    PATIENT DISPOSITION:  To PACU, stable

## 2020-07-16 NOTE — Anesthesia Preprocedure Evaluation (Addendum)
Anesthesia Evaluation  °Patient identified by MRN, date of birth, ID band °Patient awake ° ° ° °Reviewed: °Allergy & Precautions, H&P , NPO status , Patient's Chart, lab work & pertinent test results ° °Airway °Mallampati: Trach ° ° °Neck ROM: Full ° ° ° Dental °no notable dental hx. °(+) Edentulous Lower, Edentulous Upper °  °Pulmonary °shortness of breath, asthma , COPD,  COPD inhaler, former smoker,  °S/p laryngectomy °  °Pulmonary exam normal °breath sounds clear to auscultation ° ° ° ° ° ° Cardiovascular °hypertension, Pt. on home beta blockers and Pt. on medications °+CHF  °Normal cardiovascular exam °Rhythm:Regular Rate:Normal ° ° °  °Neuro/Psych °Seizures -,  PSYCHIATRIC DISORDERS Depression  Neuromuscular disease (sciatica)   ° GI/Hepatic °Neg liver ROS, GERD  Medicated and Controlled,  °Endo/Other  °Hypothyroidism  ° Renal/GU °negative Renal ROS  °negative genitourinary °  °Musculoskeletal ° °(+) Arthritis , Osteoarthritis,   ° Abdominal °(+) + obese,   °Peds °negative pediatric ROS °(+)  Hematology °negative hematology ROS °(+)   °Anesthesia Other Findings °H/o laryngeal cancer s/p chemo/radiation and surgical resection °H/o breast cancer °+ cough/secretions ° Reproductive/Obstetrics °negative OB ROS ° °  ° ° ° ° ° ° ° ° ° ° ° ° ° °  °  ° ° ° ° ° ° ° ° °Anesthesia Physical ° °Anesthesia Plan ° °ASA: III ° °Anesthesia Plan: General  ° °Post-op Pain Management:   ° °Induction: Intravenous ° °PONV Risk Score and Plan: 3 and Treatment may vary due to age or medical condition, Ondansetron, Midazolam and Dexamethasone ° °Airway Management Planned: Tracheostomy ° °Additional Equipment: None ° °Intra-op Plan:  ° °Post-operative Plan: Extubation in OR ° °Informed Consent: I have reviewed the patients History and Physical, chart, labs and discussed the procedure including the risks, benefits and alternatives for the proposed anesthesia with the patient or authorized representative  who has indicated his/her understanding and acceptance.  ° ° ° °Dental advisory given ° °Plan Discussed with: CRNA and Anesthesiologist ° °Anesthesia Plan Comments: (Plan to use 6.0 cuffed tube via stoma to deliver anesthetic gas as this has been successful multiple times in the past. + cough/secretions (plan to suction perioperatively))  ° ° ° ° ° ° °Anesthesia Quick Evaluation ° °

## 2020-07-16 NOTE — Interval H&P Note (Signed)
History and Physical Interval Note:  07/16/2020 9:00 AM  Deborah [name]  has presented today for surgery, with the diagnosis of Esophageal stricture.  The various methods of treatment have been discussed with the patient and family. After consideration of risks, benefits and other options for treatment, the patient has consented to  Procedure(s): ESOPHAGOSCOPY WITH DILATION (N/A) as a surgical intervention.  The patient's history has been reviewed, patient examined, no change in status, stable for surgery.  I have reviewed the patient's chart and labs.  Questions were answered to the patient's satisfaction.     [name]

## 2020-07-16 NOTE — Transfer of Care (Signed)
Immediate Anesthesia Transfer of Care Note  Patient: Deborah [name]  Procedure(s) Performed: ESOPHAGOSCOPY WITH DILATION (N/A Throat)  Patient Location: PACU  Anesthesia Type:General  Level of Consciousness: awake, alert  and oriented  Airway & Oxygen Therapy: Patient Spontanous Breathing and Patient connected to face mask oxygen  Post-op Assessment: Report given to RN and Post -op Vital signs reviewed and stable  Post vital signs: Reviewed and stable  Last Vitals:  Vitals Value Taken Time  BP 138/81 07/16/20 1004  Temp    Pulse 69 07/16/20 1013  Resp 16 07/16/20 1013  SpO2 96 % 07/16/20 1013  Vitals shown include unvalidated device data.  Last Pain:  Vitals:   07/16/20 0854  TempSrc: Oral  PainSc: 0-No pain         Complications: No complications documented.

## 2020-07-16 NOTE — Discharge Instructions (Signed)

## 2020-07-16 NOTE — Anesthesia Postprocedure Evaluation (Signed)
Anesthesia Post Note  Patient: Deborah [name]  Procedure(s) Performed: ESOPHAGOSCOPY WITH DILATION (N/A Throat)     Patient location during evaluation: PACU Anesthesia Type: General Level of consciousness: awake Pain management: pain level controlled Vital Signs Assessment: post-procedure vital signs reviewed and stable Respiratory status: spontaneous breathing and respiratory function stable Cardiovascular status: stable Postop Assessment: no apparent nausea or vomiting Anesthetic complications: no   No complications documented.  Last Vitals:  Vitals:   07/16/20 1030 07/16/20 1055  BP: 113/80 131/71  Pulse: 74 66  Resp: (!) 22   Temp:  37.3 C  SpO2: 100% 94%    Last Pain:  Vitals:   07/16/20 1055  TempSrc:   PainSc: 0-No pain                 Merlinda Frederick

## 2020-07-17 ENCOUNTER — Encounter (HOSPITAL_BASED_OUTPATIENT_CLINIC_OR_DEPARTMENT_OTHER): Payer: Self-pay | Admitting: Otolaryngology

## 2020-07-17 DIAGNOSIS — Z79899 Other long term (current) drug therapy: Secondary | ICD-10-CM | POA: Diagnosis not present

## 2020-07-17 DIAGNOSIS — Z9002 Acquired absence of larynx: Secondary | ICD-10-CM | POA: Diagnosis not present

## 2020-07-17 DIAGNOSIS — Z8521 Personal history of malignant neoplasm of larynx: Secondary | ICD-10-CM | POA: Diagnosis not present

## 2020-07-17 DIAGNOSIS — Z449 Encounter for fitting and adjustment of unspecified external prosthetic device: Secondary | ICD-10-CM | POA: Diagnosis not present

## 2020-07-17 DIAGNOSIS — I10 Essential (primary) hypertension: Secondary | ICD-10-CM | POA: Diagnosis not present

## 2020-07-18 ENCOUNTER — Ambulatory Visit (INDEPENDENT_AMBULATORY_CARE_PROVIDER_SITE_OTHER): Payer: Medicaid Other | Admitting: Family Medicine

## 2020-07-18 ENCOUNTER — Other Ambulatory Visit: Payer: Self-pay | Admitting: Family Medicine

## 2020-07-18 VITALS — BP 118/70 | HR 86

## 2020-07-18 DIAGNOSIS — J441 Chronic obstructive pulmonary disease with (acute) exacerbation: Secondary | ICD-10-CM

## 2020-07-18 DIAGNOSIS — J302 Other seasonal allergic rhinitis: Secondary | ICD-10-CM

## 2020-07-18 MED ORDER — PREDNISONE 20 MG PO TABS: 40.0000 mg | ORAL_TABLET | Freq: Every day | ORAL | 0 refills | Status: DC

## 2020-07-18 MED ORDER — IPRATROPIUM-ALBUTEROL 0.5-2.5 (3) MG/3ML IN SOLN
3.0000 mL | RESPIRATORY_TRACT | 1 refills | Status: DC | PRN
Start: 2020-07-18 — End: 2020-07-19

## 2020-07-18 MED ORDER — AZITHROMYCIN 250 MG PO TABS: ORAL_TABLET | ORAL | 0 refills | Status: DC

## 2020-07-18 NOTE — Progress Notes (Signed)
   Subjective:   Patient ID: Deborah [name]    DOB: Dec 09, 1955, 65 y.o. female   MRN: [id number]  Deborah [name] is a 65 y.o. female with a history of chronic diastolic CHF, hypertension, allergic rhinitis, COPD, larynx cancer, tracheitis, dysphagia, GERD, hypothyroidism, carpal tunnel syndrome, history of radiation therapy, low back pain, MDD, obesity, seizures, knee replacement here for cough and congestion  Cough and congestion: Patient notes that she has had cough and congestion for 1 week. She notes she is coughing a lot more but is having trouble getting anything up. Also endorses increased shortness of breath and difficulty walking long distances. She has been having to use her albuterol nebulizer more often. She normally only needs it seldomly. She has been using her Dulera daily. She is had Covid vaccine x3. Denies any recent Covid exposures. She did have surgery 2 days ago to widen her esophagus. Denies any fevers, chills. She had Covid testing on Friday that was negative. She declines Covid testing today.  Per chart review she has an exacerbation 1-2 times per year.  Review of Systems:  Per HPI.   Objective:   BP 118/70   Pulse 86   SpO2 91%  Vitals and nursing note reviewed.  General: pleasant older woman, sitting comfortably in exam chair, well nourished, well developed, in no acute distress with non-toxic appearance HEENT: tacheostomy in place - caps with finger when speaks  CV: regular rate and rhythm without murmurs, rubs, or gallops Lungs: mild wheezing in left upper lung field otherwise clear to auscultation bilaterally with normal work of breathing on room air Skin: warm, dry Extremities: warm and well perfused MSK: gait normal Neuro: Alert and oriented, speech normal  Assessment & Plan:   COPD exacerbation ([place]) Acute. Increased SOB and cough without increased sputum production. O2 sat 91% (baseline 93-95%). Minimal wheezing on exam. Will treat for  mild COPD exacerbation. Recommended holding off on antibiotics and starting if no improvement in 24-48 hours.  - Prednisone 40mg  QD x 5 days - If no improvement in 2 days, start Z-pac x 5 days - Continue Dulera daily - Albuterol / Duonebs q4 hours x 48 hours, then PRN - Return/ED precautions discussed  - COVID testing offered and declined   No orders of the defined types were placed in this encounter.  Meds ordered this encounter  Medications  . ipratropium-albuterol (DUONEB) 0.5-2.5 (3) MG/3ML SOLN    Sig: Take 3 mLs by nebulization every 4 (four) hours as needed.    Dispense:  360 mL    Refill:  1  . azithromycin (ZITHROMAX) 250 MG tablet    Sig: Take 2 tablets (500mg ) on day 1, then 1 tablet (250mg ) on day 2-5.    Dispense:  6 tablet    Refill:  0  . predniSONE (DELTASONE) 20 MG tablet    Sig: Take 2 tablets (40 mg total) by mouth daily with breakfast for 5 days.    Dispense:  10 tablet    Refill:  0    Mina Marble, DO PGY-3, [facility] 07/18/2020 8:34 PM

## 2020-07-18 NOTE — Patient Instructions (Signed)
It was a pleasure to see you today!  Thank you for choosing [facility] for your primary care.    Our plans for today were:  I believe you have a COPD exacerbation.  Please start Prednisone 40mg  daily for 5 days  I have called in an Antibiotic. Please delay picking it up. I recommend to pick it up if you are not improving in 2 days. It is called Azithromycin. You would take this medicine daily for 5 days.   Continue your Dulera daily  Continue Albuterol or start Duoneb (another nebulizer) every 4 hours for the next 48 hours, then take as needed for shortness of breath.    Best Wishes,   Mina Marble, DO   What You Can Do to Feel Better When You Have a Viral Illness Common Symptoms: runny eyes, muscle aches, throat irritation, ear pain, cough, sneezing, runny nose or congested nose, sinus pressure, headache, fever, fatigue   For your cough, try these:  Teaspoon of honey either alone or mixed with warm water  Tessalon pearls  Lozenges or hard candies  Laying with head elevated  Vicks/menthol rub topically on chest   For your congestion, try these:   Steroid nasal spray such as fluticasone or budesonide- helps prevent swelling in the nasal passage  Guaifenesin (mucinex) - helps thin the mucus  Steam- in a closed bathroom with hot shower running or a bedside humidifier  Drinking plenty of fluids to stay hydrated can help thin mucus   For your runny nose:   Atrovent nasal spray- helps decrease the drainage (can lead to dryness if overdone)  Nasal rinses such as a netty pot or a bulb syringe using filtered water mixed with small amount of baking soda and/or sea salt   For your fever:   Acetaminophen (Tylenol) up to 4g per day for most people  Ibuprofen 600mg  up to three times per day for most people   For your sore throat:  Drinking either warm or cold liquids (whichever feels best to you)  Gargling warm salt water     Help prevent spreading of  infection to others.  Wash your hands frequently  Avoid crowded places  Wear a mask when in public  Get your regularly scheduled vaccinations as they are recommended by the CDC.   Fun facts: -Antibiotics treat bacteria and have no effect on viruses so are not helpful in the vast majority of upper respiratory illnesses which are caused by common cold or flu viruses.  -Generic over the counter (OTC) medications have the same active ingredients and effectiveness of the more expensive name-brand version. -Vaccines are available to prevent infection with several of the most infectious/deadly viruses.

## 2020-07-18 NOTE — Assessment & Plan Note (Signed)
Acute. Increased SOB and cough without increased sputum production. O2 sat 91% (baseline 93-95%). Minimal wheezing on exam. Will treat for mild COPD exacerbation. Recommended holding off on antibiotics and starting if no improvement in 24-48 hours.  - Prednisone 40mg  QD x 5 days - If no improvement in 2 days, start Z-pac x 5 days - Continue Dulera daily - Albuterol / Duonebs q4 hours x 48 hours, then PRN - Return/ED precautions discussed  - COVID testing offered and declined

## 2020-07-19 ENCOUNTER — Telehealth: Payer: Self-pay

## 2020-07-19 ENCOUNTER — Other Ambulatory Visit: Payer: Self-pay | Admitting: *Deleted

## 2020-07-19 DIAGNOSIS — J441 Chronic obstructive pulmonary disease with (acute) exacerbation: Secondary | ICD-10-CM

## 2020-07-19 MED ORDER — IPRATROPIUM-ALBUTEROL 0.5-2.5 (3) MG/3ML IN SOLN: 3.0000 mL | RESPIRATORY_TRACT | 1 refills | Status: DC | PRN

## 2020-07-19 MED ORDER — AZITHROMYCIN 250 MG PO TABS: ORAL_TABLET | ORAL | 0 refills | Status: DC

## 2020-07-19 MED ORDER — PREDNISONE 20 MG PO TABS: 40.0000 mg | ORAL_TABLET | Freq: Every day | ORAL | 0 refills | Status: AC

## 2020-07-19 NOTE — Telephone Encounter (Signed)
Received phone call from [place] at [facility] regarding prescriptions. Pharmacist states that insurance will not cover due to provider not being listed with Medicaid.   Will send to supervising physician. Pended medications to this encounter.   Talbot Grumbling, RN

## 2020-07-19 NOTE — Telephone Encounter (Signed)
Rx to pharmacy. Casimira Sutphin, MD  Family Medicine Teaching Service   

## 2020-07-19 NOTE — Telephone Encounter (Signed)
Encounter opened in another rx request. Shontelle Muska Katharina Caper, CMA

## 2020-07-24 DIAGNOSIS — C329 Malignant neoplasm of larynx, unspecified: Secondary | ICD-10-CM | POA: Diagnosis not present

## 2020-07-24 DIAGNOSIS — C321 Malignant neoplasm of supraglottis: Secondary | ICD-10-CM | POA: Diagnosis not present

## 2020-07-24 DIAGNOSIS — Z930 Tracheostomy status: Secondary | ICD-10-CM | POA: Diagnosis not present

## 2020-08-03 ENCOUNTER — Encounter (HOSPITAL_BASED_OUTPATIENT_CLINIC_OR_DEPARTMENT_OTHER): Payer: Self-pay | Admitting: Otolaryngology

## 2020-08-15 ENCOUNTER — Other Ambulatory Visit: Payer: Self-pay | Admitting: Family Medicine

## 2020-08-15 DIAGNOSIS — J302 Other seasonal allergic rhinitis: Secondary | ICD-10-CM

## 2020-09-24 DIAGNOSIS — Z8521 Personal history of malignant neoplasm of larynx: Secondary | ICD-10-CM | POA: Diagnosis not present

## 2020-09-24 DIAGNOSIS — Z08 Encounter for follow-up examination after completed treatment for malignant neoplasm: Secondary | ICD-10-CM | POA: Diagnosis not present

## 2020-09-27 ENCOUNTER — Encounter (HOSPITAL_BASED_OUTPATIENT_CLINIC_OR_DEPARTMENT_OTHER): Payer: Self-pay | Admitting: Otolaryngology

## 2020-09-27 ENCOUNTER — Other Ambulatory Visit: Payer: Self-pay

## 2020-10-03 ENCOUNTER — Other Ambulatory Visit: Payer: Self-pay | Admitting: Family Medicine

## 2020-10-03 DIAGNOSIS — J302 Other seasonal allergic rhinitis: Secondary | ICD-10-CM

## 2020-10-03 NOTE — H&P (Signed)
Deborah [name] is an 65 y.o. female.   Chief Complaint: Dysphagia HPI: Remote history of laryngectomy and radiation, having difficulty swallowing, needing serial esophagoscopy with dilatation.      Past Medical History:  Diagnosis Date  . Anemia    h/o of  . Arthritis    in knees  . Asthma   . Breast cancer ([place])   . Bronchitis   . COPD (chronic obstructive pulmonary disease) ([place])   . Diverticulosis 11/15/2012    noted on screening colonoscopy   . Dyspnea    with COPD exerbation  . Esophageal stricture   . Former smoker 03/19/2011  . GERD (gastroesophageal reflux disease)   . Heart murmur    asymptomatic   . History of laryngectomy   . History of radiation therapy 11/15/18- 12/06/18   Right Breast total dose 42.56 Gy in 16 fractions.   [name] Hx of radiation therapy 09/03/10 to 10/16/2010   supraglottic larynx  . Hypertension   . 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 Internal hemorrhoid 11/15/2012    small, noted on screening colonoscopy   . 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  . Leukocytopenia   . Nausea alone 07/28/2013  . Neck pain 01/21/2012  . Normal MRI 07/14/11   negative for mestasis   . Pneumonia 2012  . Sciatica   . Seizures ([place])    07/24/11 off Effexor w/o seizure  . Sepsis ([place]) 08/04/12  . Sinusitis, chronic 07/20/2011   Bilateral maxillary, identified on MRI of head 07/14/11.    . Tracheostomy dependent [facility])          Past Surgical History:  Procedure Laterality Date  . BREAST LUMPECTOMY Right 07/06/2018  . BREAST LUMPECTOMY WITH RADIOACTIVE SEED LOCALIZATION Right 07/06/2018   Procedure: RIGHT BREAST LUMPECTOMY WITH RADIOACTIVE SEED LOCALIZATION;  Surgeon: Rolm Bookbinder, MD;  Location: [place];  Service: General;  Laterality: Right;  . COLONOSCOPY N/A 11/15/2012   Procedure: COLONOSCOPY;  Surgeon: Lafayette Dragon, MD;  [facility]: [facility];  Service: Endoscopy;  Laterality:  N/A;  . DENTAL RESTORATION/EXTRACTION WITH X-RAY    . ESOPHAGEAL DILATION N/A 09/09/2019   Procedure: ESOPHAGEAL DILATION;  Surgeon: [name], MD;  Location: [place];  Service: ENT;  Laterality: N/A;  . ESOPHAGEAL DILATION N/A 10/05/2019   Procedure: ESOPHAGEAL DILATION;  Surgeon: [name], MD;  Location: [place];  Service: ENT;  Laterality: N/A;  via tracheostomy  . ESOPHAGEAL DILATION N/A 02/27/2020   Procedure: ESOPHAGEAL DILATION;  Surgeon: [name], MD;  Location: [place];  Service: ENT;  Laterality: N/A;  . ESOPHAGOGASTRODUODENOSCOPY (EGD) WITH PROPOFOL N/A 08/12/2018   Procedure: ESOPHAGOGASTRODUODENOSCOPY (EGD) WITH PROPOFOL;  Surgeon: Doran Stabler, MD;  Location: [place];  Service: Gastroenterology;  Laterality: N/A;  . ESOPHAGOSCOPY  06/21/2012   Procedure: ESOPHAGOSCOPY;  Surgeon: [name], MD;  Location: [place];  Service: ENT;  Laterality: N/A;  . ESOPHAGOSCOPY WITH DILITATION N/A 09/21/2014   Procedure: ESOPHAGOSCOPY WITH DILITATION;  Surgeon: [name], MD;  Location: [place];  Service: ENT;  Laterality: N/A;  . ESOPHAGOSCOPY WITH DILITATION N/A 07/04/2016   Procedure: ESOPHAGOSCOPY WITH DILITATION;  Surgeon: [name], MD;  Location: [place];  Service: ENT;  Laterality: N/A;  . ESOPHAGOSCOPY WITH DILITATION N/A 12/01/2017   Procedure: ESOPHAGOSCOPY WITH DILITATION;  Surgeon: [name], MD;  Location: [place];  Service: ENT;  Laterality: N/A;  . ESOPHAGOSCOPY  WITH DILITATION N/A 04/19/2018   Procedure: ESOPHAGOSCOPY WITH DILITATION;  Surgeon: [name], MD;  Location: [place];  Service: ENT;  Laterality: N/A;  . ESOPHAGOSCOPY WITH DILITATION N/A 03/07/2019   Procedure: Esophagoscopy with dilatation;  Surgeon: [name], MD;  Location: [place];  Service: ENT;  Laterality: N/A;  . FLEXIBLE BRONCHOSCOPY  01/08/2018      . FOREIGN BODY REMOVAL BRONCHIAL   10/02/2011   Procedure: REMOVAL FOREIGN BODY BRONCHIAL;  Surgeon: Ruby Cola, MD;  Location: [place];  Service: ENT;  Laterality: N/A;  . FOREIGN BODY REMOVAL BRONCHIAL N/A 01/08/2018   Procedure: REMOVAL FOREIGN BODY BRONCHIAL;  Surgeon: Jodi Marble, MD;  [facility];  Service: ENT;  Laterality: N/A;  . LARYNGECTOMY    . Porta cath removal    . PORTACATH PLACEMENT  09/17/10   Tip in cavoatrial junction  . RE-EXCISION OF BREAST CANCER,SUPERIOR MARGINS Right 07/29/2018   Procedure: RE-EXCISION OF RIGHT BREAST MEDIAL MARGINS;  Surgeon: Rolm Bookbinder, MD;  Location: [place];  Service: General;  Laterality: Right;  . RIGID ESOPHAGOSCOPY N/A 03/19/2019   Procedure: FLEXIBLE ESOPHAGOSCOPY;  Surgeon: Jodi Marble, MD;  Location: [place];  Service: ENT;  Laterality: N/A;  . STOMAPLASTY N/A 10/21/2016   Procedure: [facility] Button;  Surgeon: [name], MD;  Location: [place];  Service: ENT;  Laterality: N/A;  . TOTAL KNEE ARTHROPLASTY Right 12/12/2019   Procedure: RIGHT TOTAL KNEE ARTHROPLASTY;  Surgeon: Leandrew Koyanagi, MD;  Location: [place];  Service: Orthopedics;  Laterality: Right;  . TRACHEAL DILITATION  07/16/2011   Procedure: TRACHEAL DILITATION;  Surgeon: Beckie Salts, MD;  Location: [place];  Service: ENT;  Laterality: N/A;  dilation of tracheal stoma and replacement of stoma tube  . TUBAL LIGATION  1982         Family History  Problem Relation Age of Onset  . Heart disease Mother   . Heart disease Father   . Heart disease Sister   . Cancer Brother        type unknown  . Liver disease Maternal Grandmother   . Cancer Sister        type unknown  . Diabetes Sister   . Breast cancer Neg Hx    Social History:  reports that she quit smoking about 9 years ago. Her smoking use included cigarettes. She has a 40.00 pack-year smoking history. She has never used smokeless tobacco. She reports current alcohol use. She reports that she does not use drugs.  Allergies:       Allergies  Allergen Reactions  . Lisinopril Cough    No medications prior to admission.    Lab Results Last 48 Hours  No results found for this or any previous visit (from the past 48 hour(s)).   Imaging Results (Last 48 hours)  No results found.    ROS: otherwise negative  There were no vitals taken for this visit.  PHYSICAL EXAM: Overall appearance:  Healthy appearing, in no distress Head:  Normocephalic, atraumatic. Ears: External auditory canals are clear; tympanic membranes are intact and the middle ears are free of any effusion. Nose: External nose is healthy in appearance. Internal nasal exam free of any lesions or obstruction. Oral Cavity/pharynx:  There are no mucosal lesions or masses identified. Neuro:  No identifiable neurologic deficits. Neck: No palpable neck masses.  Stoma healthy and clear.  Studies Reviewed: none    Assessment/Plan Proceed with esophagoscopy and dilatation.

## 2020-10-05 ENCOUNTER — Other Ambulatory Visit (HOSPITAL_COMMUNITY)
Admission: RE | Admit: 2020-10-05 | Discharge: 2020-10-05 | Disposition: A | Discharge status: Home / Self Care | Payer: Medicaid Other | Source: Ambulatory Visit | Attending: Otolaryngology | Admitting: Otolaryngology

## 2020-10-05 DIAGNOSIS — Z20822 Contact with and (suspected) exposure to covid-19: Secondary | ICD-10-CM | POA: Insufficient documentation

## 2020-10-05 DIAGNOSIS — Z01812 Encounter for preprocedural laboratory examination: Secondary | ICD-10-CM | POA: Insufficient documentation

## 2020-10-05 LAB — SARS CORONAVIRUS 2 (TAT 6-24 HRS): SARS Coronavirus 2: NEGATIVE

## 2020-10-05 NOTE — Progress Notes (Signed)
Sent message reminding pt to go for covid testing.  

## 2020-10-08 ENCOUNTER — Other Ambulatory Visit: Payer: Self-pay

## 2020-10-08 ENCOUNTER — Encounter (HOSPITAL_BASED_OUTPATIENT_CLINIC_OR_DEPARTMENT_OTHER): Payer: Self-pay | Admitting: Otolaryngology

## 2020-10-08 ENCOUNTER — Ambulatory Visit (HOSPITAL_BASED_OUTPATIENT_CLINIC_OR_DEPARTMENT_OTHER): Payer: Medicaid Other | Admitting: Certified Registered"

## 2020-10-08 ENCOUNTER — Encounter (HOSPITAL_BASED_OUTPATIENT_CLINIC_OR_DEPARTMENT_OTHER)
Admission: RE | Disposition: A | Discharge status: Home / Self Care | Payer: Self-pay | Source: Home / Self Care | Attending: Otolaryngology

## 2020-10-08 ENCOUNTER — Ambulatory Visit (HOSPITAL_BASED_OUTPATIENT_CLINIC_OR_DEPARTMENT_OTHER)
Admission: RE | Admit: 2020-10-08 | Discharge: 2020-10-08 | Disposition: A | Discharge status: Home / Self Care | Payer: Medicaid Other | Attending: Otolaryngology | Admitting: Otolaryngology

## 2020-10-08 DIAGNOSIS — Z8521 Personal history of malignant neoplasm of larynx: Secondary | ICD-10-CM | POA: Diagnosis not present

## 2020-10-08 DIAGNOSIS — Z96651 Presence of right artificial knee joint: Secondary | ICD-10-CM | POA: Diagnosis not present

## 2020-10-08 DIAGNOSIS — Z9002 Acquired absence of larynx: Secondary | ICD-10-CM | POA: Insufficient documentation

## 2020-10-08 DIAGNOSIS — Z923 Personal history of irradiation: Secondary | ICD-10-CM | POA: Diagnosis not present

## 2020-10-08 DIAGNOSIS — K222 Esophageal obstruction: Secondary | ICD-10-CM | POA: Insufficient documentation

## 2020-10-08 DIAGNOSIS — I5032 Chronic diastolic (congestive) heart failure: Secondary | ICD-10-CM | POA: Diagnosis not present

## 2020-10-08 DIAGNOSIS — Z9221 Personal history of antineoplastic chemotherapy: Secondary | ICD-10-CM | POA: Diagnosis not present

## 2020-10-08 DIAGNOSIS — Z888 Allergy status to other drugs, medicaments and biological substances status: Secondary | ICD-10-CM | POA: Diagnosis not present

## 2020-10-08 DIAGNOSIS — Z87891 Personal history of nicotine dependence: Secondary | ICD-10-CM | POA: Insufficient documentation

## 2020-10-08 DIAGNOSIS — J441 Chronic obstructive pulmonary disease with (acute) exacerbation: Secondary | ICD-10-CM | POA: Diagnosis not present

## 2020-10-08 DIAGNOSIS — I110 Hypertensive heart disease with heart failure: Secondary | ICD-10-CM | POA: Diagnosis not present

## 2020-10-08 HISTORY — PX: ESOPHAGOSCOPY WITH DILITATION: SHX5618

## 2020-10-08 SURGERY — ESOPHAGOSCOPY, WITH DILATION
Anesthesia: General | Site: Throat

## 2020-10-08 MED ORDER — EPINEPHRINE PF 1 MG/ML IJ SOLN
INTRAMUSCULAR | Status: AC
  Filled 2020-10-08: qty 1

## 2020-10-08 MED ORDER — ONDANSETRON HCL 4 MG/2ML IJ SOLN
INTRAMUSCULAR | Status: DC | PRN
  Administered 2020-10-08: 4 mg via INTRAVENOUS

## 2020-10-08 MED ORDER — FENTANYL CITRATE (PF) 100 MCG/2ML IJ SOLN
INTRAMUSCULAR | Status: AC
  Filled 2020-10-08: qty 2

## 2020-10-08 MED ORDER — LIDOCAINE HCL (CARDIAC) PF 100 MG/5ML IV SOSY
PREFILLED_SYRINGE | INTRAVENOUS | Status: DC | PRN
  Administered 2020-10-08: 40 mg via INTRAVENOUS
  Administered 2020-10-08: 60 mg via INTRAVENOUS

## 2020-10-08 MED ORDER — LACTATED RINGERS IV SOLN: INTRAVENOUS | Status: DC

## 2020-10-08 MED ORDER — PROPOFOL 10 MG/ML IV BOLUS
INTRAVENOUS | Status: AC
  Filled 2020-10-08: qty 40

## 2020-10-08 MED ORDER — LIDOCAINE 2% (20 MG/ML) 5 ML SYRINGE
INTRAMUSCULAR | Status: AC
  Filled 2020-10-08: qty 5

## 2020-10-08 MED ORDER — DEXAMETHASONE SODIUM PHOSPHATE 4 MG/ML IJ SOLN
INTRAMUSCULAR | Status: DC | PRN
  Administered 2020-10-08: 5 mg via INTRAVENOUS

## 2020-10-08 MED ORDER — FENTANYL CITRATE (PF) 100 MCG/2ML IJ SOLN
INTRAMUSCULAR | Status: DC | PRN
  Administered 2020-10-08: 50 ug via INTRAVENOUS

## 2020-10-08 MED ORDER — PHENYLEPHRINE HCL (PRESSORS) 10 MG/ML IV SOLN
INTRAVENOUS | Status: DC | PRN
  Administered 2020-10-08: 200 ug via INTRAVENOUS

## 2020-10-08 MED ORDER — ONDANSETRON HCL 4 MG/2ML IJ SOLN
INTRAMUSCULAR | Status: AC
  Filled 2020-10-08: qty 2

## 2020-10-08 MED ORDER — PROPOFOL 10 MG/ML IV BOLUS
INTRAVENOUS | Status: DC | PRN
  Administered 2020-10-08: 200 mg via INTRAVENOUS

## 2020-10-08 MED ORDER — DEXAMETHASONE SODIUM PHOSPHATE 10 MG/ML IJ SOLN
INTRAMUSCULAR | Status: AC
  Filled 2020-10-08: qty 1

## 2020-10-08 MED ORDER — MIDAZOLAM HCL 5 MG/5ML IJ SOLN
INTRAMUSCULAR | Status: DC | PRN
  Administered 2020-10-08: 1 mg via INTRAVENOUS

## 2020-10-08 MED ORDER — MIDAZOLAM HCL 2 MG/2ML IJ SOLN
INTRAMUSCULAR | Status: AC
  Filled 2020-10-08: qty 2

## 2020-10-08 SURGICAL SUPPLY — 22 items
CANISTER SUCT 1200ML W/VALVE (MISCELLANEOUS) ×2 IMPLANT
CNTNR URN SCR LID CUP LEK RST (MISCELLANEOUS) IMPLANT
CONT SPEC 4OZ STRL OR WHT (MISCELLANEOUS)
COVER WAND RF STERILE (DRAPES) IMPLANT
GAUZE SPONGE 4X4 12PLY STRL LF (GAUZE/BANDAGES/DRESSINGS) ×4 IMPLANT
GLOVE SURG LTX SZ7.5 (GLOVE) ×2 IMPLANT
GOWN STRL REUS W/ TWL LRG LVL3 (GOWN DISPOSABLE) IMPLANT
GOWN STRL REUS W/ TWL XL LVL3 (GOWN DISPOSABLE) IMPLANT
GOWN STRL REUS W/TWL LRG LVL3 (GOWN DISPOSABLE)
GOWN STRL REUS W/TWL XL LVL3 (GOWN DISPOSABLE)
GUARD TEETH (MISCELLANEOUS) IMPLANT
NEEDLE HYPO 18GX1.5 BLUNT FILL (NEEDLE) ×2 IMPLANT
NEEDLE SPNL 22GX7 QUINCKE BK (NEEDLE) IMPLANT
NS IRRIG 1000ML POUR BTL (IV SOLUTION) ×2 IMPLANT
PACK BASIN DAY SURGERY FS (CUSTOM PROCEDURE TRAY) ×2 IMPLANT
PATTIES SURGICAL .5 X3 (DISPOSABLE) ×2 IMPLANT
SHEET MEDIUM DRAPE 40X70 STRL (DRAPES) ×2 IMPLANT
SURGILUBE 2OZ TUBE FLIPTOP (MISCELLANEOUS) ×4 IMPLANT
SYR 5ML LL (SYRINGE) ×2 IMPLANT
SYR CONTROL 10ML LL (SYRINGE) IMPLANT
TOWEL GREEN STERILE FF (TOWEL DISPOSABLE) ×2 IMPLANT
TUBE CONNECTING 20X1/4 (TUBING) ×2 IMPLANT

## 2020-10-08 NOTE — Anesthesia Preprocedure Evaluation (Signed)
Anesthesia Evaluation  °Patient identified by MRN, date of birth, ID band °Patient awake ° ° ° °Reviewed: °Allergy & Precautions, H&P , NPO status , Patient's Chart, lab work & pertinent test results ° °Airway °Mallampati: Trach ° ° °Neck ROM: Full ° ° ° Dental °no notable dental hx. °(+) Edentulous Lower, Edentulous Upper °  °Pulmonary °shortness of breath, asthma , COPD,  COPD inhaler, former smoker,  °S/p laryngectomy °  °Pulmonary exam normal °breath sounds clear to auscultation ° ° ° ° ° ° Cardiovascular °hypertension, Pt. on home beta blockers and Pt. on medications °+CHF  °Normal cardiovascular exam °Rhythm:Regular Rate:Normal ° ° °  °Neuro/Psych °Seizures -,  PSYCHIATRIC DISORDERS Depression  Neuromuscular disease (sciatica)   ° GI/Hepatic °Neg liver ROS, GERD  Medicated and Controlled,  °Endo/Other  °Hypothyroidism  ° Renal/GU °negative Renal ROS  °negative genitourinary °  °Musculoskeletal ° °(+) Arthritis , Osteoarthritis,   ° Abdominal °(+) + obese,   °Peds °negative pediatric ROS °(+)  Hematology °negative hematology ROS °(+)   °Anesthesia Other Findings °H/o laryngeal cancer s/p chemo/radiation and surgical resection °H/o breast cancer °+ cough/secretions ° Reproductive/Obstetrics °negative OB ROS ° °  ° ° ° ° ° ° ° ° ° ° ° ° ° °  °  ° ° ° ° ° ° ° ° °Anesthesia Physical ° °Anesthesia Plan ° °ASA: III ° °Anesthesia Plan: General  ° °Post-op Pain Management:   ° °Induction: Intravenous ° °PONV Risk Score and Plan: 3 and Treatment may vary due to age or medical condition, Ondansetron, Midazolam and Dexamethasone ° °Airway Management Planned: Tracheostomy ° °Additional Equipment: None ° °Intra-op Plan:  ° °Post-operative Plan: Extubation in OR ° °Informed Consent: I have reviewed the patients History and Physical, chart, labs and discussed the procedure including the risks, benefits and alternatives for the proposed anesthesia with the patient or authorized representative  who has indicated his/her understanding and acceptance.  ° ° ° °Dental advisory given ° °Plan Discussed with: CRNA and Anesthesiologist ° °Anesthesia Plan Comments: (Plan to use 6.0 cuffed tube via stoma to deliver anesthetic gas as this has been successful multiple times in the past. + cough/secretions (plan to suction perioperatively))  ° ° ° ° ° ° °Anesthesia Quick Evaluation ° °

## 2020-10-08 NOTE — Op Note (Signed)
OPERATIVE REPORT  DATE OF SURGERY: 10/08/2020  PATIENT:  Deborah [name],  65 y.o. female  PRE-OPERATIVE DIAGNOSIS:  Esophageal stricture  POST-OPERATIVE DIAGNOSIS: Esophageal stricture  PROCEDURE:  Procedure(s): ESOPHAGOSCOPY WITH DILATION  SURGEON:  Beckie Salts, MD  ASSISTANTS: None  ANESTHESIA:   General   EBL: 0 ml  DRAINS: None  LOCAL MEDICATIONS USED:  None  SPECIMEN:  none  COUNTS:  Correct  PROCEDURE DETAILS: The patient was taken to the operating room and placed on the operating table in the supine position. Following induction of general endotracheal anesthesia, via the laryngectomy stoma, the patient was then draped in a standard fashion for esophagoscopy.  The rigid cervical esophagoscope was entered into the oral cavity and used to inspect the nasopharynx.  I was not able to pass it into the esophagus due to the stricture.  A Jako laryngoscope was then used for the remainder of the case.  The savory dilators were used starting at a #21 [place] and serially dilating up to 63 [place] were passed either through the laryngoscope or around it depending on the size of the dilator.  There was no bleeding.  There is nice stretching of the stricture site.  The scope was removed and the patient was awakened extubated and transferred to recovery in stable condition.    PATIENT DISPOSITION:  To PACU, stable

## 2020-10-08 NOTE — Interval H&P Note (Signed)
History and Physical Interval Note:  10/08/2020 7:19 AM  Deborah [name]  has presented today for surgery, with the diagnosis of Esophageal stricture.  The various methods of treatment have been discussed with the patient and family. After consideration of risks, benefits and other options for treatment, the patient has consented to  Procedure(s): ESOPHAGOSCOPY With Dilation (N/A) as a surgical intervention.  The patient's history has been reviewed, patient examined, no change in status, stable for surgery.  I have reviewed the patient's chart and labs.  Questions were answered to the patient's satisfaction.     Deborah [name]

## 2020-10-08 NOTE — Transfer of Care (Signed)
Immediate Anesthesia Transfer of Care Note  Patient: Deborah [name]  Procedure(s) Performed: ESOPHAGOSCOPY With Dilation (N/A Throat)  Patient Location: PACU  Anesthesia Type:General  Level of Consciousness: awake, alert  and oriented  Airway & Oxygen Therapy: Patient Spontanous Breathing and Patient connected to face mask oxygen  Post-op Assessment: Report given to RN and Post -op Vital signs reviewed and stable  Post vital signs: Reviewed and stable  Last Vitals:  Vitals Value Taken Time  BP 173/80 10/08/20 0808  Temp    Pulse 69 10/08/20 0809  Resp 24 10/08/20 0809  SpO2 92 % 10/08/20 0809  Vitals shown include unvalidated device data.  Last Pain:  Vitals:   10/08/20 0701  TempSrc: Oral  PainSc: 0-No pain      Patients Stated Pain Goal: 4 (15/05/69 7948)  Complications: No complications documented.

## 2020-10-08 NOTE — Discharge Instructions (Signed)

## 2020-10-08 NOTE — Anesthesia Postprocedure Evaluation (Signed)
Anesthesia Post Note  Patient: Deborah [name]  Procedure(s) Performed: ESOPHAGOSCOPY With Dilation (N/A Throat)     Patient location during evaluation: PACU Anesthesia Type: General Level of consciousness: awake and alert Pain management: pain level controlled Vital Signs Assessment: post-procedure vital signs reviewed and stable Respiratory status: spontaneous breathing, nonlabored ventilation, respiratory function stable and patient connected to nasal cannula oxygen Cardiovascular status: blood pressure returned to baseline and stable Postop Assessment: no apparent nausea or vomiting Anesthetic complications: no   No complications documented.  Last Vitals:  Vitals:   10/08/20 0815 10/08/20 0836  BP: (!) 168/68 (!) 145/77  Pulse: 67 69  Resp: 17 14  Temp:  37.1 C  SpO2: 96% 96%    Last Pain:  Vitals:   10/08/20 0828  TempSrc:   PainSc: 0-No pain                 Tiajuana Amass

## 2020-10-09 ENCOUNTER — Encounter (HOSPITAL_BASED_OUTPATIENT_CLINIC_OR_DEPARTMENT_OTHER): Payer: Self-pay | Admitting: Otolaryngology

## 2020-10-12 DIAGNOSIS — Z930 Tracheostomy status: Secondary | ICD-10-CM | POA: Diagnosis not present

## 2020-10-12 DIAGNOSIS — C329 Malignant neoplasm of larynx, unspecified: Secondary | ICD-10-CM | POA: Diagnosis not present

## 2020-10-12 DIAGNOSIS — C321 Malignant neoplasm of supraglottis: Secondary | ICD-10-CM | POA: Diagnosis not present

## 2020-12-10 ENCOUNTER — Other Ambulatory Visit: Payer: Self-pay | Admitting: Family Medicine

## 2020-12-10 DIAGNOSIS — J302 Other seasonal allergic rhinitis: Secondary | ICD-10-CM

## 2020-12-10 DIAGNOSIS — E039 Hypothyroidism, unspecified: Secondary | ICD-10-CM

## 2020-12-10 DIAGNOSIS — I10 Essential (primary) hypertension: Secondary | ICD-10-CM

## 2020-12-11 ENCOUNTER — Ambulatory Visit (INDEPENDENT_AMBULATORY_CARE_PROVIDER_SITE_OTHER): Payer: Medicaid Other | Admitting: Orthopaedic Surgery

## 2020-12-11 ENCOUNTER — Encounter: Payer: Self-pay | Admitting: Orthopaedic Surgery

## 2020-12-11 ENCOUNTER — Other Ambulatory Visit: Payer: Self-pay

## 2020-12-11 ENCOUNTER — Ambulatory Visit (INDEPENDENT_AMBULATORY_CARE_PROVIDER_SITE_OTHER): Payer: Medicaid Other

## 2020-12-11 ENCOUNTER — Ambulatory Visit: Payer: Self-pay

## 2020-12-11 ENCOUNTER — Ambulatory Visit: Payer: Medicaid Other | Admitting: Family Medicine

## 2020-12-11 VITALS — BP 160/82 | HR 83 | Ht 67.0 in | Wt 219.6 lb

## 2020-12-11 DIAGNOSIS — M1712 Unilateral primary osteoarthritis, left knee: Secondary | ICD-10-CM | POA: Diagnosis not present

## 2020-12-11 DIAGNOSIS — F339 Major depressive disorder, recurrent, unspecified: Secondary | ICD-10-CM | POA: Diagnosis not present

## 2020-12-11 DIAGNOSIS — M25562 Pain in left knee: Secondary | ICD-10-CM

## 2020-12-11 DIAGNOSIS — E039 Hypothyroidism, unspecified: Secondary | ICD-10-CM

## 2020-12-11 DIAGNOSIS — I10 Essential (primary) hypertension: Secondary | ICD-10-CM

## 2020-12-11 DIAGNOSIS — Z96651 Presence of right artificial knee joint: Secondary | ICD-10-CM | POA: Diagnosis not present

## 2020-12-11 DIAGNOSIS — G629 Polyneuropathy, unspecified: Secondary | ICD-10-CM

## 2020-12-11 DIAGNOSIS — Z23 Encounter for immunization: Secondary | ICD-10-CM | POA: Diagnosis not present

## 2020-12-11 LAB — POCT GLYCOSYLATED HEMOGLOBIN (HGB A1C): Hemoglobin A1C: 5.5 % (ref 4.0–5.6)

## 2020-12-11 MED ORDER — ALBUTEROL SULFATE HFA 108 (90 BASE) MCG/ACT IN AERS: 2.0000 | INHALATION_SPRAY | RESPIRATORY_TRACT | 11 refills | Status: DC | PRN

## 2020-12-11 MED ORDER — METOPROLOL TARTRATE 50 MG PO TABS: 50.0000 mg | ORAL_TABLET | Freq: Two times a day (BID) | ORAL | 3 refills | Status: DC

## 2020-12-11 MED ORDER — HYDROCHLOROTHIAZIDE 12.5 MG PO CAPS: 12.5000 mg | ORAL_CAPSULE | Freq: Every day | ORAL | 1 refills | Status: DC

## 2020-12-11 MED ORDER — ALBUTEROL SULFATE (5 MG/ML) 0.5% IN NEBU: 2.5000 mg | INHALATION_SOLUTION | Freq: Four times a day (QID) | RESPIRATORY_TRACT | 5 refills | Status: DC | PRN

## 2020-12-11 MED ORDER — DULERA 200-5 MCG/ACT IN AERO: 2.0000 | INHALATION_SPRAY | Freq: Two times a day (BID) | RESPIRATORY_TRACT | 5 refills | Status: DC

## 2020-12-11 NOTE — Progress Notes (Signed)
Office Visit Note   Patient: Deborah [name]           Date of Birth: August 02, 1955           MRN: [id number] Visit Date: 12/11/2020              Requested by: Benay Pike, MD [phone],  [place] [address] PCP: Benay Pike, MD   Assessment & Plan: Visit Diagnoses:  1. S/P total knee replacement, right   2. Primary osteoarthritis of left knee     Plan: Impression is 1 year status post right total knee replacement 12/12/2019 and end-stage left knee tricompartmental DJD.  I am very happy that she is doing well from her knee replacement.  We will see her back in another year for follow-up for this.  Dental prophylaxis reinforced.  In terms of the left knee she has exhausted conservative treatments and at this time she would like to proceed with a left total knee replacement in the near future.  Again we reviewed the risk benefits rehab recovery time and time out of work.  Questions encouraged and answered.  Follow-Up Instructions: Return for Postop.   Orders:  Orders Placed This Encounter  Procedures   XR Knee 1-2 Views Right   XR KNEE 3 VIEW LEFT   No orders of the defined types were placed in this encounter.     Procedures: No procedures performed   Clinical Data: No additional findings.   Subjective: Chief Complaint  Patient presents with   Right Knee - Follow-up, Pain   Left Knee - Pain    Deborah [name] is a 65 year old female 1 year status post right total knee replacement on 12/12/2019.  She is doing very well and very happy with everything has no complaints.  She is currently working.  Her main problem is the left knee pain due to DJD.  She has had prior cortisone injections without significant relief.  Currently uses a knee sleeve as well as takes ibuprofen Aleve on a daily basis.  The pain has become severe and constant and is affecting daily activities.   Review of Systems  Constitutional: Negative.   HENT: Negative.    Eyes: Negative.    Respiratory: Negative.    Cardiovascular: Negative.   Endocrine: Negative.   Musculoskeletal: Negative.   Neurological: Negative.   Hematological: Negative.   Psychiatric/Behavioral: Negative.    All other systems reviewed and are negative.   Objective: Vital Signs: There were no vitals taken for this visit.  Physical Exam Vitals and nursing note reviewed.  Constitutional:      Appearance: She is well-developed.  Pulmonary:     Effort: Pulmonary effort is normal.  Skin:    General: Skin is warm.     Capillary Refill: Capillary refill takes less than 2 seconds.  Neurological:     Mental Status: She is alert and oriented to person, place, and time.  Psychiatric:        Behavior: Behavior normal.        Thought Content: Thought content normal.        Judgment: Judgment normal.    Ortho Exam Right knee shows a fully healed surgical scar.  Excellent range of motion.  Stable collaterals. Left knee shows significant decreased range of motion with patellofemoral crepitus and with pain.  Collaterals and cruciates are stable. Specialty Comments:  No specialty comments available.  Imaging: XR Knee 1-2 Views Right  Result Date: 12/11/2020 Stable total  knee replacement in good alignment   XR KNEE 3 VIEW LEFT  Result Date: 12/11/2020 Advanced tricompartment degenerative joint disease    PMFS History: Patient Active Problem List   Diagnosis Date Noted   Status post total right knee replacement 12/12/2019   Primary osteoarthritis of right knee 12/11/2019   Localized swelling, mass and lump, neck 07/20/2019   Allergic rhinitis 05/20/2019   GERD (gastroesophageal reflux disease)    COPD (chronic obstructive pulmonary disease) ([facility])    Chronic diastolic (congestive) heart failure ([place])    Breast cancer ([facility]) 07/06/2018   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[place]) 04/08/2018   Carpal tunnel syndrome  03/16/2018   Obesity 08/24/2017   Plantar fasciitis 06/06/2016   Low back pain 05/21/2016   Osteoarthritis of knees, bilateral 05/31/2015   Knee pain 01/04/2015   Dysphagia 08/16/2014   Weight gain 08/05/2014   DOE (dyspnea on exertion) 04/28/2014   Frequent falls 12/23/2013   Anemia in chronic illness 08/11/2012   Status post trachelectomy 08/04/2012   Chronic pain 01/20/2012   History of head and neck cancer 09/26/2011   Hx of radiation therapy    Tracheitis 07/17/2011   Seizure ([place]) 07/17/2011   Hypothyroidism 03/19/2011   Essential hypertension 03/19/2011   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   Leukocytopenia    Nausea alone 07/28/2013   Neck pain 01/21/2012   Normal MRI 07/14/11   negative for mestasis    Pneumonia 2012   Sciatica    Seizures ([place])    07/24/11 off Effexor w/o seizure   Sepsis ([facility]) 08/04/12   Sinusitis, chronic 07/20/2011   Bilateral maxillary, identified on MRI of head 07/14/11.     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[place])     Family History  Problem Relation Age of Onset   Heart disease Mother    Heart disease Father    Heart disease Sister    Cancer  Brother        type unknown   Liver disease Maternal Grandmother    Cancer Sister        type unknown   Diabetes Sister    Breast cancer Neg Hx     Past Surgical History:  Procedure Laterality Date   BREAST LUMPECTOMY Right 07/06/2018   BREAST LUMPECTOMY WITH RADIOACTIVE SEED LOCALIZATION Right 07/06/2018   Procedure: RIGHT BREAST LUMPECTOMY WITH RADIOACTIVE SEED LOCALIZATION;  Surgeon: Rolm Bookbinder, MD;  Location: [facility];  Service:  General;  Laterality: Right;   COLONOSCOPY N/A 11/15/2012   Procedure: COLONOSCOPY;  Surgeon: Lafayette Dragon, MD;  [facility];  Service: Endoscopy;  Laterality: N/A;   DENTAL RESTORATION/EXTRACTION WITH X-RAY     ESOPHAGEAL DILATION N/A 09/09/2019   Procedure: ESOPHAGEAL DILATION;  Surgeon: [name], MD;  Location: [facility];  Service: ENT;  Laterality: N/A;   ESOPHAGEAL DILATION N/A 10/05/2019   Procedure: ESOPHAGEAL DILATION;  Surgeon: [name], MD;  Location: [facility];  Service: ENT;  Laterality: N/A;  via tracheostomy   ESOPHAGEAL DILATION N/A 02/27/2020   Procedure: ESOPHAGEAL DILATION;  Surgeon: [name], MD;  Location: [facility];  Service: ENT;  Laterality: N/A;   ESOPHAGOGASTRODUODENOSCOPY (EGD) WITH PROPOFOL N/A 08/12/2018   Procedure: ESOPHAGOGASTRODUODENOSCOPY (EGD) WITH PROPOFOL;  Surgeon: Doran Stabler, MD;  Location: [facility];  Service: Gastroenterology;  Laterality: N/A;   ESOPHAGOSCOPY  06/21/2012   Procedure: ESOPHAGOSCOPY;  Surgeon: [name], MD;  Location: [facility];  Service: ENT;  Laterality: N/A;   ESOPHAGOSCOPY WITH DILITATION N/A 09/21/2014   Procedure: ESOPHAGOSCOPY WITH DILITATION;  Surgeon: [name], MD;  Location: [facility];  Service: ENT;  Laterality: N/A;   ESOPHAGOSCOPY WITH DILITATION N/A 07/04/2016   Procedure: ESOPHAGOSCOPY WITH DILITATION;  Surgeon: [name], MD;  Location: [facility];  Service: ENT;  Laterality: N/A;   ESOPHAGOSCOPY  WITH DILITATION N/A 12/01/2017   Procedure: ESOPHAGOSCOPY WITH DILITATION;  Surgeon: [name], MD;  Location: [facility];  Service: ENT;  Laterality: N/A;   ESOPHAGOSCOPY WITH DILITATION N/A 04/19/2018   Procedure: ESOPHAGOSCOPY WITH DILITATION;  Surgeon: [name], MD;  Location: [facility];  Service: ENT;  Laterality: N/A;   ESOPHAGOSCOPY WITH DILITATION N/A 03/07/2019   Procedure: Esophagoscopy with dilatation;  Surgeon: [name], MD;  Location: [facility];  Service: ENT;  Laterality: N/A;   ESOPHAGOSCOPY WITH DILITATION N/A 07/16/2020   Procedure: ESOPHAGOSCOPY WITH DILATION;  Surgeon: [name], MD;  Location: [facility];  Service: ENT;  Laterality: N/A;   [place] N/A 10/08/2020   Procedure: ESOPHAGOSCOPY With Dilation;  Surgeon: [name], MD;  Location: [facility];  Service: ENT;  Laterality: N/A;  [address]  01/08/2018       FOREIGN BODY REMOVAL BRONCHIAL  10/02/2011   Procedure: REMOVAL FOREIGN BODY BRONCHIAL;  Surgeon: Ruby Cola, MD;  Location: [facility];  Service: ENT;  Laterality: N/A;   FOREIGN BODY REMOVAL BRONCHIAL N/A 01/08/2018   Procedure: REMOVAL FOREIGN BODY BRONCHIAL;  Surgeon: Jodi Marble, MD;  Location: [facility];  Service: ENT;  Laterality: N/A;   LARYNGECTOMY     Porta cath removal     PORTACATH PLACEMENT  09/17/10   Tip in cavoatrial junction   RE-EXCISION OF BREAST CANCER,SUPERIOR MARGINS Right 07/29/2018   Procedure: RE-EXCISION OF RIGHT BREAST MEDIAL MARGINS;  Surgeon: Rolm Bookbinder, MD;  Location: [facility];  Service: General;  Laterality: Right;   RIGID ESOPHAGOSCOPY N/A 03/19/2019   Procedure: FLEXIBLE ESOPHAGOSCOPY;  Surgeon: Jodi Marble, MD;  Location: [facility];  Service: ENT;  Laterality: N/A;   STOMAPLASTY N/A 10/21/2016   Procedure: Zola Button;  Surgeon: [name], MD;  Location: [facility];  Service: ENT;  Laterality: N/A;   TOTAL KNEE ARTHROPLASTY Right  12/12/2019   Procedure: RIGHT TOTAL KNEE ARTHROPLASTY;  Surgeon: Leandrew Koyanagi, MD;  Location: [facility];  Service: Orthopedics;  Laterality: Right;  TRACHEAL DILITATION  07/16/2011   Procedure: TRACHEAL DILITATION;  Surgeon: Beckie Salts, MD;  Location: [facility];  Service: ENT;  Laterality: N/A;  dilation of tracheal stoma and replacement of stoma tube   TUBAL LIGATION  1982   Social History   Occupational History   Occupation: part time    Comment: warehouse work  Tobacco Use   Smoking status: Former    Packs/day: 2.00    Years: 20.00    Pack years: 40.00    Types: Cigarettes    Quit date: 09/17/2010    Years since quitting: 10.2   Smokeless tobacco: Never  Vaping Use   Vaping Use: Never used  Substance and Sexual Activity   Alcohol use: Yes    Comment: Socially drinks    Drug use: No    Comment: tried cocaine 1 time 2010 only used 1 time   Sexual activity: Yes    Birth control/protection: Surgical

## 2020-12-11 NOTE — Progress Notes (Signed)
    SUBJECTIVE:   CHIEF COMPLAINT / HPI:  Patient is here for her annual physical. She states everything is going well. No major concerns.    Htn: taking amlodipine, losartan, lopressor. Is compliant with these medications.  Is okay with starting a new medication for blood pressure.  Has never taken hctz before.    Thyroid: patient compliant with her synthroid.  No side effects.    MDD - on welbutrin.  States she thinks it is 'wearing off' after taking it for so many years.  Has 'ups and downs' with her mood.  Recently lost her best friend.  Would like to try an alternative medication. Patient states that she had seizures many years ago when on lisinopril but these stopped after stopping the medication.    Neuropathy: patient states she gets numbness in her fingers bilaterally occasionally.  This has become more frequent over the past year and now occurs at some point every day.  Also has some occasional numbness in left foot but not as often as hands.  No weakness in the fingers.   Patient willing to get pneumococcal vaccine today.  Willing to get pap smear at next visit.  She states insurance wouldn't cover her shingles vaccine.   PERTINENT  PMH / PSH: laryngeal cancer, htn, depression  OBJECTIVE:   BP (!) 160/82   Pulse 83   Ht 5\' 7"  (1.702 m)   Wt 219 lb 9.6 oz (99.6 kg)   SpO2 97%   BMI 34.39 kg/m   Gen: alert, oriented. No acute distress.   [place]ent: perrla, eomi Neck: tracheostomy present.  Cv: rrr Pulm: lctab. No wheezes or crackles.  Msk: 5/5 grip and finger strength bilaterally Neuro: sensation to light touch intact in upper extremities bilaterally Psych: pleasant affect.   ASSESSMENT/PLAN:   Essential hypertension Compliant with medications.  Still elevated.  Adding hctz 12.5.  F/u in 1-2 weeks.  Can increase to 25 if necessary.  Once titrated to appropriate dose consider switching to combo pill with her losartan if covered by insurance  Hypothyroidism Compliant  with medications.  Previous tsh normal 1 year ago.  Refill rx and repeat tsh  Peripheral polyneuropathy Progressive Bilateral hand numbness/tingling.  Previously diagnosed with carpal tunnel in 2019. Not wearing splints currently.  Will get b12, a1c, cmp to look for other causes.   Major depressive disorder Patient feels like medication not working as well as it did previously.  Would like to try something else.  Hx of seizures is questionable but would favor switching off of wellbutrin. Given other changes we made today, will defer switch until next visit when she follows up for htn and pap.  At that time would start pt on ssri and begin wellbutrin taper.       Benay Pike, MD [address]

## 2020-12-11 NOTE — Patient Instructions (Addendum)
It was nice to see you today,  We did the following changes today:  - I have refilled your blood pressure medications and your thyroid medication.  - I have ordered some blood test.  I will call you with the results when I get them.  - I added a blood pressure medication today.  I would like you to follow-up in approximately 1 to 2 weeks so we can recheck it and increase the dose if necessary.  - At your next visit we can discuss tapering off this depressant medication and switching out for another one.  - I am getting some blood test to follow-up on your neuropathy.  We can also discuss this further at your next visit in 1 to 2 weeks.  Have a great day,  Clemetine Marker, MD

## 2020-12-12 DIAGNOSIS — G629 Polyneuropathy, unspecified: Secondary | ICD-10-CM | POA: Insufficient documentation

## 2020-12-12 LAB — COMPREHENSIVE METABOLIC PANEL
ALT: 30 IU/L (ref 0–32)
AST: 38 IU/L (ref 0–40)
Albumin/Globulin Ratio: 1.6 (ref 1.2–2.2)
Albumin: 4.9 g/dL — ABNORMAL HIGH (ref 3.8–4.8)
Alkaline Phosphatase: 114 IU/L (ref 44–121)
BUN/Creatinine Ratio: 21 (ref 12–28)
BUN: 22 mg/dL (ref 8–27)
Bilirubin Total: 0.4 mg/dL (ref 0.0–1.2)
CO2: 25 mmol/L (ref 20–29)
Calcium: 9.1 mg/dL (ref 8.7–10.3)
Chloride: 101 mmol/L (ref 96–106)
Creatinine, Ser: 1.05 mg/dL — ABNORMAL HIGH (ref 0.57–1.00)
Globulin, Total: 3 g/dL (ref 1.5–4.5)
Glucose: 81 mg/dL (ref 65–99)
Potassium: 4.3 mmol/L (ref 3.5–5.2)
Sodium: 141 mmol/L (ref 134–144)
Total Protein: 7.9 g/dL (ref 6.0–8.5)
eGFR: 60 mL/min/{1.73_m2} (ref >59)

## 2020-12-12 LAB — VITAMIN B12: Vitamin B-12: 464 pg/mL (ref 232–1245)

## 2020-12-12 LAB — TSH: TSH: 56 u[IU]/mL — ABNORMAL HIGH (ref 0.450–4.500)

## 2020-12-12 NOTE — Assessment & Plan Note (Signed)
Compliant with medications.  Still elevated.  Adding hctz 12.5.  F/u in 1-2 weeks.  Can increase to 25 if necessary.  Once titrated to appropriate dose consider switching to combo pill with her losartan if covered by insurance

## 2020-12-12 NOTE — Assessment & Plan Note (Signed)
Progressive Bilateral hand numbness/tingling.  Previously diagnosed with carpal tunnel in 2019. Not wearing splints currently.  Will get b12, a1c, cmp to look for other causes.

## 2020-12-12 NOTE — Assessment & Plan Note (Signed)
Patient feels like medication not working as well as it did previously.  Would like to try something else.  Hx of seizures is questionable but would favor switching off of wellbutrin. Given other changes we made today, will defer switch until next visit when she follows up for htn and pap.  At that time would start pt on ssri and begin wellbutrin taper.

## 2020-12-12 NOTE — Assessment & Plan Note (Signed)
Compliant with medications.  Previous tsh normal 1 year ago.  Refill rx and repeat tsh

## 2020-12-13 ENCOUNTER — Telehealth: Payer: Self-pay | Admitting: Family Medicine

## 2020-12-13 DIAGNOSIS — E039 Hypothyroidism, unspecified: Secondary | ICD-10-CM

## 2020-12-13 NOTE — Telephone Encounter (Signed)
Patient presents to front office to discuss lab results. Discussed elevated TSH levels with patient. Patient endorses compliance on thyroid medication. Please advise.   Patient also has follow up appointment with new PCP on 12/25/2020.  Talbot Grumbling, RN

## 2020-12-13 NOTE — Telephone Encounter (Signed)
Tried to call the patient regarding her TSH levels.  Patient had endorsed compliance with euthyrox.  Would like to confirm this with patient that she is taking her medications consistently and properly before increasing her euthryox. If necessary we can increase dosing from 171mcg to 261mcg and recheck in 6 weeks.

## 2020-12-18 MED ORDER — LEVOTHYROXINE SODIUM 200 MCG PO TABS: 200.0000 ug | ORAL_TABLET | Freq: Every day | ORAL | 3 refills | Status: DC

## 2020-12-18 NOTE — Telephone Encounter (Signed)
Reviewed notes and sent new Rx for higher dose of levothyroxine to [facility].

## 2020-12-18 NOTE — Addendum Note (Signed)
Addended by: Zenia Resides on: 12/18/2020 05:02 PM   Modules accepted: Orders

## 2020-12-20 ENCOUNTER — Other Ambulatory Visit: Payer: Self-pay

## 2020-12-20 MED ORDER — METOPROLOL TARTRATE 50 MG PO TABS: 50.0000 mg | ORAL_TABLET | Freq: Two times a day (BID) | ORAL | 3 refills | Status: DC

## 2020-12-20 MED ORDER — CETIRIZINE HCL 10 MG PO TABS: 10.0000 mg | ORAL_TABLET | Freq: Every day | ORAL | 1 refills | Status: DC

## 2020-12-24 NOTE — Progress Notes (Signed)
    SUBJECTIVE:   CHIEF COMPLAINT / HPI:   Hypothyroidism Levothyroxine dose recently increased from 175 mcg to 200 mcg as TSH elevated at 56.0 at most recent visit two weeks ago with previous PCP Dr. Jeannine Kitten. Endorses good adherence to medication  HTN On amlodipine 10 mg, losartan 100 mg daily, and metoprolol tartrate 50 mg BID. HCTZ 12.5 mg recently added two weeks ago given elevated BP 160/82. 160/75 yesterday. Had a headache with this which resolved after sleeping. Mostly variable, ranges from 120s to 160s SBP BP higher in the afternoons  Depression Has been on buproprion since 2010. Feels it hasn't been effective and wants to change medications. Initially on it for smoking, no longing smoking. Feels symptoms of low energy, low motivation,  Loss of multiple family members and friends over the past 2 years, most recent loss of a close friend Feeling a lot of anxiety as well Is not interested in therapy, does not feel it will help Denies SI Denies history of periods feeling like she does not need sleep and denies periods of manic symptoms  Left knee replacement surgery August 15th. S/p right knee replacement.  PERTINENT  PMH / PSH: hypothyroidism, HFpEF, HTN, COPD, GERD, laryngeal cancer, COPD, depression  OBJECTIVE:   BP 122/62   Pulse 86   Ht 5\' 7"  (1.702 m)   Wt 215 lb 12.8 oz (97.9 kg)   SpO2 95%   BMI 33.80 kg/m   General: Obese, trach in place, NAD CV: RRR, 2/6 systolic murmur Pulm: CTAB, no wheezes or rales   Depression screen [facility] 2/9 12/25/2020  Decreased Interest 2  Down, Depressed, Hopeless 0  PHQ - 2 Score 2  Altered sleeping 0  Tired, decreased energy 3  Change in appetite 0  Feeling bad or failure about yourself  2  Trouble concentrating 2  Moving slowly or fidgety/restless 1  Suicidal thoughts 0  PHQ-9 Score 10  Difficult doing work/chores Somewhat difficult  Some recent data might be hidden     ASSESSMENT/PLAN:   Essential  hypertension Controlled in office today though home readings labile. - changed losartan and HCTZ to combined form - continue amlodipine and metprolol - consider ambulatory BP monitoring at follow-up if still labile  Hypothyroidism Recent dose increase from 175 to 200 mcg. Will plan to re-check TSH in 2 months.  Major depressive disorder Feels buproprion not effective and requesting to switch to another medication. Has never been on SSRI. No SI. - d/c buproprion - start escitalopram 10 mg   HCM - pap smear due, last completed 12/27/2015 - patient to schedule - shingles vaccine due, apparently not covered by her insurance - Covid vaccine 2nd booster given today, observed without incident  Zola Button, MD [place]

## 2020-12-25 ENCOUNTER — Ambulatory Visit (INDEPENDENT_AMBULATORY_CARE_PROVIDER_SITE_OTHER): Payer: Medicaid Other

## 2020-12-25 ENCOUNTER — Other Ambulatory Visit: Payer: Self-pay

## 2020-12-25 ENCOUNTER — Ambulatory Visit: Payer: Medicaid Other | Admitting: Family Medicine

## 2020-12-25 ENCOUNTER — Encounter: Payer: Self-pay | Admitting: Family Medicine

## 2020-12-25 VITALS — BP 122/62 | HR 86 | Ht 67.0 in | Wt 215.8 lb

## 2020-12-25 DIAGNOSIS — F339 Major depressive disorder, recurrent, unspecified: Secondary | ICD-10-CM

## 2020-12-25 DIAGNOSIS — I10 Essential (primary) hypertension: Secondary | ICD-10-CM | POA: Diagnosis not present

## 2020-12-25 DIAGNOSIS — E039 Hypothyroidism, unspecified: Secondary | ICD-10-CM | POA: Diagnosis not present

## 2020-12-25 DIAGNOSIS — Z23 Encounter for immunization: Secondary | ICD-10-CM | POA: Diagnosis not present

## 2020-12-25 MED ORDER — LOSARTAN POTASSIUM-HCTZ 100-12.5 MG PO TABS: 1.0000 | ORAL_TABLET | Freq: Every day | ORAL | 1 refills | Status: DC

## 2020-12-25 MED ORDER — ESCITALOPRAM OXALATE 10 MG PO TABS: 10.0000 mg | ORAL_TABLET | Freq: Every day | ORAL | 2 refills | Status: DC

## 2020-12-25 MED ORDER — OMEPRAZOLE 40 MG PO CPDR: 40.0000 mg | DELAYED_RELEASE_CAPSULE | Freq: Two times a day (BID) | ORAL | 0 refills | Status: DC

## 2020-12-25 NOTE — Assessment & Plan Note (Addendum)
Feels buproprion not effective and requesting to switch to another medication. Has never been on SSRI. No SI. - d/c buproprion - start escitalopram 10 mg, titrate as needed at follow-up visit

## 2020-12-25 NOTE — Assessment & Plan Note (Signed)
Controlled in office today though home readings labile. - changed losartan and HCTZ to combined form - continue amlodipine and metprolol - consider ambulatory BP monitoring at follow-up if still labile

## 2020-12-25 NOTE — Patient Instructions (Addendum)
It was nice seeing you today!  Start taking Lexapro and stop the Wellbutrin. You do not need to taper.  I have combined your HCTZ and losartan - the new combo is called Hyzaar.  Your pap smear is due.  We will recheck your thyroid levels in 2 months.  Please arrive at least 15 minutes prior to your scheduled appointments.  Stay well, Zola Button, MD [place] [phone]

## 2020-12-25 NOTE — Assessment & Plan Note (Signed)
Recent dose increase from 175 to 200 mcg. Will plan to re-check TSH in 2 months.

## 2020-12-26 DIAGNOSIS — C321 Malignant neoplasm of supraglottis: Secondary | ICD-10-CM | POA: Diagnosis not present

## 2020-12-26 DIAGNOSIS — C329 Malignant neoplasm of larynx, unspecified: Secondary | ICD-10-CM | POA: Diagnosis not present

## 2020-12-26 DIAGNOSIS — Z930 Tracheostomy status: Secondary | ICD-10-CM | POA: Diagnosis not present

## 2020-12-26 LAB — BASIC METABOLIC PANEL
BUN/Creatinine Ratio: 22 (ref 12–28)
BUN: 25 mg/dL (ref 8–27)
CO2: 20 mmol/L (ref 20–29)
Calcium: 9.8 mg/dL (ref 8.7–10.3)
Chloride: 98 mmol/L (ref 96–106)
Creatinine, Ser: 1.15 mg/dL — ABNORMAL HIGH (ref 0.57–1.00)
Glucose: 85 mg/dL (ref 65–99)
Potassium: 4.3 mmol/L (ref 3.5–5.2)
Sodium: 141 mmol/L (ref 134–144)
eGFR: 54 mL/min/{1.73_m2} — ABNORMAL LOW (ref >59)

## 2020-12-30 ENCOUNTER — Other Ambulatory Visit: Payer: Self-pay | Admitting: Hematology and Oncology

## 2021-01-01 ENCOUNTER — Inpatient Hospital Stay: Payer: Medicaid Other | Attending: Hematology and Oncology | Admitting: Hematology and Oncology

## 2021-01-01 NOTE — Assessment & Plan Note (Deleted)
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  Breast cancer surveillance: 1. Mammogram in 06/22/20 benign, density cat B 2. Bone density 03/2019 normal 3.  Breast exam 01/01/2021: Benign  Return to clinic in 1 year for follow-up

## 2021-01-04 ENCOUNTER — Other Ambulatory Visit: Payer: Self-pay

## 2021-01-09 ENCOUNTER — Ambulatory Visit: Payer: Medicaid Other | Admitting: Hematology and Oncology

## 2021-01-18 NOTE — Patient Instructions (Addendum)
It was nice seeing you today!  I will send you a letter in the mail if your pap test is normal. If the pap test is normal, you will not need any more pap smears.  Follow-up in 2 months for thyroid and blood pressure.  Please arrive at least 15 minutes prior to your scheduled appointments.  Stay well, Zola Button, MD [place] [phone]

## 2021-01-18 NOTE — Progress Notes (Signed)
    SUBJECTIVE:   CHIEF COMPLAINT / HPI: pap smear  Last pap 12/27/2015 normal. No history of abnormal paps. Declines STI testing.  Depression At last visit 1 month ago, was switched from bupropion to escitalopram 10 mg. Reports improvement with escitalopram, feels more energy. Feels she is on a good dose.  Has upcoming knee surgery 8/15.  PERTINENT  PMH / PSH: depression, HTN  OBJECTIVE:   BP 130/85   Pulse 88   Ht '5\' 7"'$  (1.702 m)   Wt 220 lb 9.6 oz (100.1 kg)   SpO2 95%   BMI 34.55 kg/m   General: obese female, NAD CV: normal rate Pulm: no respiratory distress GU: no external lesions, cervix appears normal  Chaperone Lavell Anchors CMA present  Depression screen [facility] 2/9 01/23/2021  Decreased Interest 2  Down, Depressed, Hopeless 2  PHQ - 2 Score 4  Altered sleeping 3  Tired, decreased energy 0  Change in appetite 0  Feeling bad or failure about yourself  0  Trouble concentrating 2  Moving slowly or fidgety/restless 0  Suicidal thoughts 0  PHQ-9 Score 9  Difficult doing work/chores Not difficult at all  Some recent data might be hidden     ASSESSMENT/PLAN:   Major depressive disorder Well-controlled on current dose of escitalopram, no changes today.   Pap smear Collected today with HPV cotesting, no more pap testing needed if negative.  Zola Button, MD [place]

## 2021-01-18 NOTE — Progress Notes (Signed)
Surgical Instructions    Your procedure is scheduled on 01/28/2021 .  Report to [facility] Main Entrance "A" at 05:30 A.M., then check in with the Admitting office.  Call this number if you have problems the morning of surgery:  [phone]   If you have any questions prior to your surgery date call [phone]: Open Monday-Friday 8am-4pm    Remember:  Do not eat after midnight the night before your surgery  You may drink clear liquids until 04:15 the morning of your surgery.   Clear liquids allowed are: Water, Non-Citrus Juices (without pulp), Carbonated Beverages, Clear Tea, Black Coffee Only, and Gatorade  Patient Instructions  The night before surgery:  No food after midnight. ONLY clear liquids after midnight  The day of surgery (if you do NOT have diabetes):  Drink ONE (1) Pre-Surgery Clear Ensure by 04:15 the morning of surgery. Drink in one sitting. Do not sip.  This drink was given to you during your hospital  pre-op appointment visit.  Nothing else to drink after completing the  Pre-Surgery Clear Ensure.         If you have questions, please contact your surgeon's office.     Take these medicines the morning of surgery with A SIP OF WATER:  amLODipine (NORVASC) cetirizine (ZYRTEC)  escitalopram (LEXAPRO)  levothyroxine (SYNTHROID) metoprolol tartrate (LOPRESSOR) omeprazole (PRILOSEC)  mometasone-formoterol (DULERA) - please, bring the inhaler with you  If needed:  albuterol (PROAIR HFA) - please, bring the inhaler with you albuterol (PROVENTIL) benzonatate (TESSALON)   Ask the doctor if you need to stop anastrozole (ARIMIDEX) before surgery  As of today, STOP taking any Aspirin (unless otherwise instructed by your surgeon) Aleve, Naproxen, Ibuprofen, Motrin, Advil, Goody's, BC's, all herbal medications, fish oil, and all vitamins.          Do not wear jewelry or makeup Do not wear lotions, powders, perfumes, or deodorant. Do not shave 48 hours prior  to surgery.   Do not bring valuables to the hospital. DO Not wear nail polish, gel polish, artificial nails, or any other type of covering on natural nails including finger and toenails. If patients have artificial nails, gel coating, etc. that need to be removed by a nail salon please have this removed prior to surgery or surgery may need to be canceled/delayed if the surgeon/ anesthesia feels like the patient is unable to be adequately monitored.             [facility] is not responsible for any belongings or valuables.  Do NOT Smoke (Tobacco/Vaping) or drink Alcohol 24 hours prior to your procedure If you use a CPAP at night, you may bring all equipment for your overnight stay.   Contacts, glasses, dentures or bridgework may not be worn into surgery, please bring cases for these belongings   For patients admitted to the hospital, discharge time will be determined by your treatment team.   Patients discharged the day of surgery will not be allowed to drive home, and someone needs to stay with them for 24 hours.  ONLY 1 SUPPORT PERSON MAY BE PRESENT WHILE YOU ARE IN SURGERY. IF YOU ARE TO BE ADMITTED ONCE YOU ARE IN YOUR ROOM YOU WILL BE ALLOWED TWO (2) VISITORS.  Minor children may have two parents present. Special consideration for safety and communication needs will be reviewed on a case by case basis.  Special instructions:    Oral Hygiene is also important to reduce your risk of infection.  Remember -  BRUSH YOUR TEETH THE MORNING OF SURGERY WITH YOUR REGULAR TOOTHPASTE   [facility]- Preparing For Surgery  Before surgery, you can play an important role. Because skin is not sterile, your skin needs to be as free of germs as possible. You can reduce the number of germs on your skin by washing with CHG (chlorahexidine gluconate) Soap before surgery.  CHG is an antiseptic cleaner which kills germs and bonds with the skin to continue killing germs even after washing.     Please do not  use if you have an allergy to CHG or antibacterial soaps. If your skin becomes reddened/irritated stop using the CHG.  Do not shave (including legs and underarms) for at least 48 hours prior to first CHG shower. It is OK to shave your face.  Please follow these instructions carefully.     Shower the NIGHT BEFORE SURGERY and the MORNING OF SURGERY with CHG Soap.   If you chose to wash your hair, wash your hair first as usual with your normal shampoo. After you shampoo, rinse your hair and body thoroughly to remove the shampoo.  Then [employer] and genitals (private parts) with your normal soap and rinse thoroughly to remove soap.  After that Use CHG Soap as you would any other liquid soap. You can apply CHG directly to the skin and wash gently with a scrungie or a clean washcloth.   Apply the CHG Soap to your body ONLY FROM THE NECK DOWN.  Do not use on open wounds or open sores. Avoid contact with your eyes, ears, mouth and genitals (private parts). Wash Face and genitals (private parts)  with your normal soap.   Wash thoroughly, paying special attention to the area where your surgery will be performed.  Thoroughly rinse your body with warm water from the neck down.  DO NOT shower/wash with your normal soap after using and rinsing off the CHG Soap.  Pat yourself dry with a CLEAN TOWEL.  Wear CLEAN PAJAMAS to bed the night before surgery  Place CLEAN SHEETS on your bed the night before your surgery  DO NOT SLEEP WITH PETS.   Day of Surgery:  Take a shower with CHG soap. Wear Clean/Comfortable clothing the morning of surgery Do not apply any deodorants/lotions.   Remember to brush your teeth WITH YOUR REGULAR TOOTHPASTE.   Please read over the following fact sheets that you were given.

## 2021-01-21 ENCOUNTER — Encounter (HOSPITAL_COMMUNITY)
Admission: RE | Admit: 2021-01-21 | Discharge: 2021-01-21 | Disposition: A | Discharge status: Home / Self Care | Payer: Medicaid Other | Source: Ambulatory Visit | Attending: Orthopaedic Surgery | Admitting: Orthopaedic Surgery

## 2021-01-21 ENCOUNTER — Other Ambulatory Visit: Payer: Self-pay | Admitting: Physician Assistant

## 2021-01-21 ENCOUNTER — Telehealth: Payer: Self-pay | Admitting: Orthopaedic Surgery

## 2021-01-21 ENCOUNTER — Other Ambulatory Visit: Payer: Self-pay

## 2021-01-21 ENCOUNTER — Ambulatory Visit (HOSPITAL_COMMUNITY)
Admission: RE | Admit: 2021-01-21 | Discharge: 2021-01-21 | Disposition: A | Discharge status: Home / Self Care | Payer: Medicaid Other | Source: Ambulatory Visit | Attending: Physician Assistant | Admitting: Physician Assistant

## 2021-01-21 ENCOUNTER — Encounter (HOSPITAL_COMMUNITY): Payer: Self-pay

## 2021-01-21 DIAGNOSIS — M1712 Unilateral primary osteoarthritis, left knee: Secondary | ICD-10-CM | POA: Diagnosis not present

## 2021-01-21 DIAGNOSIS — Z01818 Encounter for other preprocedural examination: Secondary | ICD-10-CM | POA: Diagnosis not present

## 2021-01-21 HISTORY — DX: Anxiety disorder, unspecified: F41.9

## 2021-01-21 LAB — CBC WITH DIFFERENTIAL/PLATELET
Abs Immature Granulocytes: 0.02 10*3/uL (ref 0.00–0.07)
Basophils Absolute: 0.1 10*3/uL (ref 0.0–0.1)
Basophils Relative: 2 %
Eosinophils Absolute: 0.4 10*3/uL (ref 0.0–0.5)
Eosinophils Relative: 10 %
HCT: 37.9 % (ref 36.0–46.0)
Hemoglobin: 11.8 g/dL — ABNORMAL LOW (ref 12.0–15.0)
Immature Granulocytes: 1 %
Lymphocytes Relative: 31 %
Lymphs Abs: 1.3 10*3/uL (ref 0.7–4.0)
MCH: 30.2 pg (ref 26.0–34.0)
MCHC: 31.1 g/dL (ref 30.0–36.0)
MCV: 96.9 fL (ref 80.0–100.0)
Monocytes Absolute: 0.4 10*3/uL (ref 0.1–1.0)
Monocytes Relative: 10 %
Neutro Abs: 2 10*3/uL (ref 1.7–7.7)
Neutrophils Relative %: 46 %
Platelets: 320 10*3/uL (ref 150–400)
RBC: 3.91 MIL/uL (ref 3.87–5.11)
RDW: 13.2 % (ref 11.5–15.5)
WBC: 4.2 10*3/uL (ref 4.0–10.5)
nRBC: 0 % (ref 0.0–0.2)

## 2021-01-21 LAB — URINALYSIS, ROUTINE W REFLEX MICROSCOPIC
Bacteria, UA: NONE SEEN
Bilirubin Urine: NEGATIVE
Glucose, UA: NEGATIVE mg/dL
Hgb urine dipstick: NEGATIVE
Ketones, ur: NEGATIVE mg/dL
Nitrite: NEGATIVE
Protein, ur: NEGATIVE mg/dL
Specific Gravity, Urine: 1.017 (ref 1.005–1.030)
pH: 5 (ref 5.0–8.0)

## 2021-01-21 LAB — COMPREHENSIVE METABOLIC PANEL
ALT: 25 U/L (ref 0–44)
AST: 27 U/L (ref 15–41)
Albumin: 3.8 g/dL (ref 3.5–5.0)
Alkaline Phosphatase: 73 U/L (ref 38–126)
Anion gap: 8 (ref 5–15)
BUN: 13 mg/dL (ref 8–23)
CO2: 29 mmol/L (ref 22–32)
Calcium: 9.2 mg/dL (ref 8.9–10.3)
Chloride: 104 mmol/L (ref 98–111)
Creatinine, Ser: 1.04 mg/dL — ABNORMAL HIGH (ref 0.44–1.00)
GFR, Estimated: >60 mL/min (ref >60)
Glucose, Bld: 113 mg/dL — ABNORMAL HIGH (ref 70–99)
Potassium: 4 mmol/L (ref 3.5–5.1)
Sodium: 141 mmol/L (ref 135–145)
Total Bilirubin: 0.3 mg/dL (ref 0.3–1.2)
Total Protein: 7.9 g/dL (ref 6.5–8.1)

## 2021-01-21 LAB — PROTIME-INR
INR: 1 (ref 0.8–1.2)
Prothrombin Time: 13 seconds (ref 11.4–15.2)

## 2021-01-21 LAB — SURGICAL PCR SCREEN
MRSA, PCR: POSITIVE — AB
Staphylococcus aureus: POSITIVE — AB

## 2021-01-21 LAB — TYPE AND SCREEN
ABO/RH(D): A NEG
Antibody Screen: NEGATIVE

## 2021-01-21 LAB — APTT: aPTT: 27 seconds (ref 24–36)

## 2021-01-21 MED ORDER — OXYCODONE-ACETAMINOPHEN 5-325 MG PO TABS: 1.0000 | ORAL_TABLET | Freq: Four times a day (QID) | ORAL | 0 refills | Status: DC | PRN

## 2021-01-21 MED ORDER — ONDANSETRON HCL 4 MG PO TABS: 4.0000 mg | ORAL_TABLET | Freq: Three times a day (TID) | ORAL | 0 refills | Status: DC | PRN

## 2021-01-21 MED ORDER — METHOCARBAMOL 500 MG PO TABS: 500.0000 mg | ORAL_TABLET | Freq: Two times a day (BID) | ORAL | 0 refills | Status: DC | PRN

## 2021-01-21 MED ORDER — ASPIRIN EC 81 MG PO TBEC: 81.0000 mg | DELAYED_RELEASE_TABLET | Freq: Two times a day (BID) | ORAL | 0 refills | Status: DC

## 2021-01-21 MED ORDER — DOCUSATE SODIUM 100 MG PO CAPS: 100.0000 mg | ORAL_CAPSULE | Freq: Every day | ORAL | 2 refills | Status: DC | PRN

## 2021-01-21 NOTE — Progress Notes (Addendum)
PCP - Stark Klein, MD Cardiologist - denies  PPM/ICD - denies Device Orders - N/A Rep Notified - N/A  Chest x-ray - 01/21/2021 EKG - 01/21/2021 Stress Test - 10/07/2017 ECHO - 08/31/2017 Cardiac Cath - denies  Sleep Study - denies CPAP - N/A  Fasting Blood Sugar - N/A  Blood Thinner Instructions: N/A Aspirin Instructions: Patient was instructed: As of today, STOP taking any Aspirin (unless otherwise instructed by your surgeon) Aleve, Naproxen, Ibuprofen, Motrin, Advil, Goody's, BC's, all herbal medications, fish oil, and all vitamins.  Patient was instructed to ask the doctor if she need to stop anastrozole (ARIMIDEX) before surgery  ERAS Protcol - yes PRE-SURGERY Ensure - yes  COVID TEST- patient was instructed to go to [place] site for test on 01/25/2021   Anesthesia review: yes; cardiac history; patient has a trach; Leukocytes, UA - trace; MRSA PCR and Staph - positive  Patient denies shortness of breath, fever, cough and chest pain at PAT appointment   All instructions explained to the patient, with a verbal understanding of the material. Patient agrees to go over the instructions while at home for a better understanding. Patient also instructed to self quarantine after being tested for COVID-19. The opportunity to ask questions was provided.

## 2021-01-21 NOTE — Progress Notes (Signed)
Abnormal lab in PAT- MRSA PCR and Staph - positive; Leukocytes, UA - trace. Dr. Erlinda Hong office was called with this results and the surgery scheduler was notified.

## 2021-01-21 NOTE — Telephone Encounter (Signed)
Let's make her 2nd or 3rd case.  Thanks.

## 2021-01-21 NOTE — Telephone Encounter (Signed)
Patient is MRSA positive and will need vancomycin.  She will need to be moved to either second or third case as we prefer not to have this for a first case

## 2021-01-21 NOTE — Progress Notes (Signed)
Can you call patient and see if she is having any urinary symptoms like frequency, urgency, burning, etc

## 2021-01-21 NOTE — Telephone Encounter (Signed)
  Patient is scheduled for Left Total Knee Arthroplasty at [facility]  01-28-21 @ 7:15am.    Patient had PAT visit this morning.  Greta, RN called to provide the following results:   FYI:  Leukocytes, UA - trace;  MRSA PCR and Staph - positive

## 2021-01-22 ENCOUNTER — Encounter (HOSPITAL_COMMUNITY): Payer: Self-pay | Admitting: Physician Assistant

## 2021-01-22 NOTE — Progress Notes (Signed)
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  Patient has had a number of procedures at [facility] without complication.  Per last anesthesia procedure note 10/08/2020, 6.0 cuffed tube via stoma has been used to deliver anesthetic gas with success multiple times in the past.  Patient had cardiology evaluation in 2019, chronic lateral T wave inversions were noted at that time as well.  She had nuclear stress test that was nonischemic, low risk and echo showing normal LVEF, grade 2 diastolic dysfunction, mild AR.  She was advised to follow-up as needed based on benign testing.  Preop labs reviewed, unremarkable.  EKG 01/21/2021: Normal sinus rhythm.  Rate 76. Possible Left atrial enlargement. T wave abnormality, consider lateral ischemia. No significant change since last tracing  CHEST - 2 VIEW 01/21/2021:   COMPARISON:  March 19, 2019.   FINDINGS: The heart size and mediastinal contours are within normal limits. Both lungs are clear. The visualized skeletal structures are unremarkable.   IMPRESSION: No active cardiopulmonary disease.   Nuclear stress test 10/07/17: Nuclear stress EF: 61%. There was no ST segment deviation noted during stress. Defect 1: There is a medium defect of moderate severity present in the mid anteroseptal and apex location. The study is normal. This is a low risk study. The left ventricular ejection fraction is normal (55-65%).   Echo 08/31/17: - Left ventricle: The cavity size was mildly reduced. Wall thickness was increased in a pattern of moderate LVH. Systolic function was vigorous. The estimated ejection fraction was in the range of 65% to 70%. Wall motion was normal; there were no regional wall motion abnormalities. Features are consistent with a  pseudonormal left ventricular filling pattern, with concomitant abnormal relaxation and increased filling pressure (grade 2 diastolic dysfunction). - Aortic valve: There was mild regurgitation. - Pulmonary arteries: Systolic pressure was mildly increased. PA peak pressure: 35 mm Hg (S).   Deborah [name] [facility] Short Stay Center/Anesthesiology Phone [phone] 01/22/2021 1:43 PM

## 2021-01-22 NOTE — Anesthesia Preprocedure Evaluation (Deleted)
Anesthesia Evaluation    Airway        Dental   Pulmonary former smoker,           Cardiovascular hypertension,      Neuro/Psych    GI/Hepatic   Endo/Other    Renal/GU      Musculoskeletal   Abdominal   Peds  Hematology   Anesthesia Other Findings   Reproductive/Obstetrics                             Anesthesia Physical Anesthesia Plan  ASA:   Anesthesia Plan:    Post-op Pain Management:    Induction:   PONV Risk Score and Plan:   Airway Management Planned:   Additional Equipment:   Intra-op Plan:   Post-operative Plan:   Informed Consent:   Plan Discussed with:   Anesthesia Plan Comments: (PAT note by Karoline Caldwell, PA-C: Pertinent hx includesHTN, heart murmur(mild aortic regurgitation on 2019 echo), asthma, COPD, hypothyroidism, anemia,laryngeal cancer (s/p laryngectomy and radiation, tracheostomy dependent, s/p stomaplasty 10/21/16, TEP managed at [facility]).  Patient has had a number of procedures at [facility] without complication.  Per last anesthesia procedure note 10/08/2020, 6.0 cuffed tube via stoma has been used to deliver anesthetic gas with success multiple times in the past.  Patient had cardiology evaluation in 2019, chronic lateral T wave inversions were noted at that time as well.  She had nuclear stress test that was nonischemic, low risk and echo showing normal LVEF, grade 2 diastolic dysfunction, mild AR.  She was advised to follow-up as needed based on benign testing.  Preop labs reviewed, unremarkable.  EKG 01/21/2021: Normal sinus rhythm.  Rate 76. Possible Left atrial enlargement. T wave abnormality, consider lateral ischemia. No significant change since last tracing  CHEST - 2 VIEW 01/21/2021:  COMPARISON: March 19, 2019.  FINDINGS: The heart size and mediastinal contours are within normal limits. Both lungs are clear. The visualized skeletal structures  are unremarkable.  IMPRESSION: No active cardiopulmonary disease.  Nuclear stress test 10/07/17: . Nuclear stress EF: 61%. . There was no ST segment deviation noted during stress. . Defect 1: There is a medium defect of moderate severity present in the mid anteroseptal and apex location. . The study is normal. . This is a low risk study. . The left ventricular ejection fraction is normal (55-65%).  Echo 08/31/17: -Left ventricle: The cavity size was mildly reduced. Wall thickness was increased in a pattern of moderate LVH. Systolic function was vigorous. The estimated ejection fraction was in therange of 65% to 70%. Wall motion was normal; there were noregional wall motion abnormalities. Features are consistent witha pseudonormal left ventricular filling pattern, with concomitant abnormal relaxation and increased filling pressure (grade 2 diastolic dysfunction). - Aortic valve: There was mild regurgitation. - Pulmonary arteries: Systolic pressure was mildly increased. PA peak pressure: 35 mm Hg (S).  )        Anesthesia Quick Evaluation

## 2021-01-22 NOTE — Progress Notes (Signed)
Some bacteria in urine.  If no symptoms, no need to treat

## 2021-01-23 ENCOUNTER — Other Ambulatory Visit (HOSPITAL_COMMUNITY)
Admission: RE | Admit: 2021-01-23 | Discharge: 2021-01-23 | Disposition: A | Discharge status: Home / Self Care | Payer: Medicaid Other | Source: Ambulatory Visit | Attending: Family Medicine | Admitting: Family Medicine

## 2021-01-23 ENCOUNTER — Other Ambulatory Visit: Payer: Self-pay

## 2021-01-23 ENCOUNTER — Ambulatory Visit (INDEPENDENT_AMBULATORY_CARE_PROVIDER_SITE_OTHER): Payer: Medicaid Other | Admitting: Family Medicine

## 2021-01-23 ENCOUNTER — Encounter: Payer: Self-pay | Admitting: Family Medicine

## 2021-01-23 VITALS — BP 130/85 | HR 88 | Ht 67.0 in | Wt 220.6 lb

## 2021-01-23 DIAGNOSIS — F339 Major depressive disorder, recurrent, unspecified: Secondary | ICD-10-CM

## 2021-01-23 DIAGNOSIS — Z01419 Encounter for gynecological examination (general) (routine) without abnormal findings: Secondary | ICD-10-CM | POA: Insufficient documentation

## 2021-01-23 NOTE — Assessment & Plan Note (Signed)
Well-controlled on current dose of escitalopram, no changes today.

## 2021-01-23 NOTE — Progress Notes (Signed)
Per patient no symptoms

## 2021-01-24 LAB — CYTOLOGY - PAP
Comment: NEGATIVE
Diagnosis: NEGATIVE
High risk HPV: NEGATIVE

## 2021-01-25 ENCOUNTER — Encounter (HOSPITAL_COMMUNITY): Payer: Self-pay | Admitting: Family Medicine

## 2021-01-25 ENCOUNTER — Other Ambulatory Visit: Payer: Self-pay | Admitting: Orthopaedic Surgery

## 2021-01-25 NOTE — Progress Notes (Signed)
Unable to reach patient, left message. Spoke with patient's husband. He voiced understanding of new arrival time of 0800 on Monday.

## 2021-01-26 LAB — SARS CORONAVIRUS 2 (TAT 6-24 HRS): SARS Coronavirus 2: POSITIVE — AB

## 2021-01-26 NOTE — Progress Notes (Signed)
Called [facility] with on-call service for Dr. Erlinda Hong with + covid results for this pt. The pt is to have surgery on Mon. 01/28/21 at 1005 at [facility]. The pt has not been notified as there will be pertinent questions the provider needs to answer.   Positive Results for:  Asymptomatic: Procedure postponed and quarantined for 10 days, unless the procedure is urgent. Please follow the facility's protocol regarding pt and family arrival.  Symptomatic: Procedure postponed and quarantined for 14 days.  Hospitalized with Covid: postponed and quarantined  for 21 days.  Immunocompromised: Procedure postponed and quarantine for 20 days.  The pt will not be retested for 90 days from the + result.

## 2021-01-26 NOTE — Progress Notes (Signed)
Shellia Cleverly for Dr. Erlinda Hong informed of pt's positive covid test.

## 2021-01-28 ENCOUNTER — Ambulatory Visit (HOSPITAL_COMMUNITY): Admission: RE | Admit: 2021-01-28 | Payer: Medicaid Other | Source: Home / Self Care | Admitting: Orthopaedic Surgery

## 2021-01-28 ENCOUNTER — Telehealth: Payer: Self-pay

## 2021-01-28 ENCOUNTER — Encounter (HOSPITAL_COMMUNITY): Admission: RE | Payer: Self-pay | Source: Home / Self Care

## 2021-01-28 SURGERY — ARTHROPLASTY, KNEE, TOTAL
Anesthesia: Spinal | Site: Knee | Laterality: Left

## 2021-01-28 NOTE — Telephone Encounter (Signed)
I would not start any antivirals if she is asymptomatic. She can call if she develops symptoms and can look into prescribing.

## 2021-01-28 NOTE — Telephone Encounter (Signed)
Patient calls nurse line reporting she tested positive for covid on 8/12. Patient denies any symptoms at this time. Patient states she tested for surgery on 8/12. Patient would like to know if she should start antivirals. Please advise.

## 2021-01-30 DIAGNOSIS — M1712 Unilateral primary osteoarthritis, left knee: Secondary | ICD-10-CM | POA: Diagnosis not present

## 2021-01-30 DIAGNOSIS — Z96652 Presence of left artificial knee joint: Secondary | ICD-10-CM | POA: Diagnosis not present

## 2021-01-31 ENCOUNTER — Inpatient Hospital Stay: Payer: Medicaid Other | Admitting: Hematology and Oncology

## 2021-01-31 ENCOUNTER — Telehealth: Payer: Self-pay | Admitting: Hematology and Oncology

## 2021-01-31 ENCOUNTER — Other Ambulatory Visit: Payer: Self-pay

## 2021-01-31 NOTE — Assessment & Plan Note (Deleted)
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  Breast cancer surveillance: 1. Mammogram 06/22/2020 benign 2. Bone density 03/2019 normal 3.  Breast exam 01/31/2021: Benign  Return to clinic in 1 year for follow-up

## 2021-01-31 NOTE — Telephone Encounter (Signed)
R/s appt per 8/18 sch msg. Pt aware.

## 2021-02-04 ENCOUNTER — Telehealth: Payer: Self-pay

## 2021-02-04 NOTE — Telephone Encounter (Signed)
Patient calls nurse line requesting new prescription for humidifier for tracheostomy.   Please advise if new order can be placed. Please route back to "RN Team" for further processing once order is complete.   Talbot Grumbling, RN

## 2021-02-04 NOTE — Telephone Encounter (Signed)
Is there a specific humidifier or brand that she needs? There are multiple orders for humidifer.

## 2021-02-05 MED ORDER — HUMIDIFIERS MISC: 1.0000 "application " | 0 refills | Status: DC | PRN

## 2021-02-07 NOTE — Telephone Encounter (Signed)
Community message sent to [employer]. Will await response.   Ivory Bail C Tailor Westfall, RN  

## 2021-02-12 ENCOUNTER — Other Ambulatory Visit: Payer: Self-pay

## 2021-02-12 ENCOUNTER — Ambulatory Visit (INDEPENDENT_AMBULATORY_CARE_PROVIDER_SITE_OTHER): Payer: Medicaid Other | Admitting: Orthopaedic Surgery

## 2021-02-12 DIAGNOSIS — M1712 Unilateral primary osteoarthritis, left knee: Secondary | ICD-10-CM

## 2021-02-12 NOTE — Progress Notes (Signed)
Deborah [name] is here to reschedule her left total knee replacement which was canceled for positive COVID test.  She has been asymptomatic throughout this entire time.  Unfortunately [name] is not here in the office today so [name] will give her a call later this week to reschedule the surgery.

## 2021-02-13 NOTE — Progress Notes (Signed)
Patient Care Team: Zola Button, MD as PCP - General (Family Medicine) [name], MD as Attending Physician (Otolaryngology) Kyung Rudd, MD (Radiation Oncology) Heath Lark, MD as Consulting Physician (Hematology and Oncology) Rolm Bookbinder, MD as Consulting Physician (General Surgery) Nicholas Lose, MD as Consulting Physician (Hematology and Oncology) Eppie Gibson, MD as Attending Physician (Radiation Oncology)  DIAGNOSIS:    ICD-10-CM   1. 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       SUMMARY OF ONCOLOGIC HISTORY: Oncology History  Ductal carcinoma in situ (DCIS) of right breast  05/26/2018 Initial Diagnosis   History of laryngeal cancer treated with chemoradiation, screening detected right breast calcifications medial right breast measuring 1.9 cm, biopsy revealed high-grade DCIS with comedonecrosis and calcifications ER 90%, PR 90%, Tis NX stage 0   06/02/2018 Cancer Staging   Staging form: Breast, AJCC 8th Edition - Clinical: Stage 0 (cTis (DCIS), cN0, cM0, ER+, PR+, HER2: Not Assessed) - Signed by Nicholas Lose, MD on 06/02/2018   07/06/2018 Surgery   Right lumpectomy: No residual carcinoma identified, right medial margin excision: DCIS high-grade 1.5 cm margins negative, additional margins benign, ER 90%, PR 90%, Tis NX stage 0   09/06/2018 -  Anti-estrogen oral therapy   Anastrozole 76m daily   11/16/2018 - 12/06/2018 Radiation Therapy   Adjuvant radiation     CHIEF COMPLIANT: Follow-up of right breast DCIS on anastrozole  INTERVAL HISTORY: Deborah [name] a 65y.o. with above-mentioned history of right breast DCIS who underwent lumpectomy with re-excision, radiation, and is currently on antiestrogen therapy with anastrozole. Mammogram on 06/22/2020 showed no evidence of malignancy bilaterally. She presents to the clinic today for follow-up.  ALLERGIES:  is allergic to lisinopril.  MEDICATIONS:  Current Outpatient Medications   Medication Sig Dispense Refill   albuterol (PROAIR HFA) 108 (90 Base) MCG/ACT inhaler Inhale 2 puffs into the lungs every 4 (four) hours as needed for wheezing or shortness of breath. 18 g 11   albuterol (PROVENTIL) (5 MG/ML) 0.5% nebulizer solution Take 0.5 mLs (2.5 mg total) by nebulization every 6 (six) hours as needed for wheezing or shortness of breath. Please give the patient 10 219mvials. 200 mL 5   amLODipine (NORVASC) 10 MG tablet Take 1 tablet by mouth daily (Patient taking differently: Take 10 mg by mouth daily.) 90 tablet 3   anastrozole (ARIMIDEX) 1 MG tablet Take 1 tablet by mouth once daily (Patient taking differently: Take 1 mg by mouth daily.) 90 tablet 0   aspirin EC 81 MG tablet Take 1 tablet (81 mg total) by mouth 2 (two) times daily. To be taken after surgery 84 tablet 0   benzonatate (TESSALON) 100 MG capsule TAKE 1 CAPSULE BY MOUTH TWICE DAILY AS NEEDED FOR COUGH (Patient taking differently: Take 100 mg by mouth 2 (two) times daily as needed for cough.) 30 capsule 0   cetirizine (ZYRTEC) 10 MG tablet Take 1 tablet (10 mg total) by mouth daily. 90 tablet 1   docusate sodium (COLACE) 100 MG capsule Take 1 capsule (100 mg total) by mouth daily as needed. 30 capsule 2   escitalopram (LEXAPRO) 10 MG tablet Take 1 tablet (10 mg total) by mouth daily. 30 tablet 2   Humidifiers MISC 1 application by Tracheal Tube route as needed. 1 each 0   ipratropium-albuterol (DUONEB) 0.5-2.5 (3) MG/3ML SOLN Take 3 mLs by nebulization every 4 (four) hours as needed. (Patient not taking: No sig reported) 360 mL 1  levothyroxine (SYNTHROID) 200 MCG tablet Take 1 tablet (200 mcg total) by mouth daily before breakfast. 90 tablet 3   losartan-hydrochlorothiazide (HYZAAR) 100-12.5 MG tablet Take 1 tablet by mouth daily. 90 tablet 1   methocarbamol (ROBAXIN) 500 MG tablet Take 1 tablet (500 mg total) by mouth 2 (two) times daily as needed. To be taken after surgery 20 tablet 0   metoprolol tartrate  (LOPRESSOR) 50 MG tablet Take 1 tablet (50 mg total) by mouth 2 (two) times daily. 180 tablet 3   mometasone-formoterol (DULERA) 200-5 MCG/ACT AERO Inhale 2 puffs into the lungs 2 (two) times daily. 13 g 5   nystatin cream (MYCOSTATIN) APPLY  CREAM TOPICALLY TWICE DAILY AS NEEDED (Patient taking differently: Apply 1 application topically daily as needed for dry skin.) 30 g 0   omeprazole (PRILOSEC) 40 MG capsule Take 1 capsule (40 mg total) by mouth 2 (two) times daily. 90 capsule 0   ondansetron (ZOFRAN) 4 MG tablet Take 1 tablet (4 mg total) by mouth every 8 (eight) hours as needed for nausea or vomiting. 40 tablet 0   oxyCODONE-acetaminophen (PERCOCET) 5-325 MG tablet Take 1-2 tablets by mouth every 6 (six) hours as needed. To be taken after surgery 40 tablet 0   No current facility-administered medications for this visit.    PHYSICAL EXAMINATION: ECOG PERFORMANCE STATUS: 1 - Symptomatic but completely ambulatory  Vitals:   02/15/21 0918  BP: 131/74  Pulse: 72  Resp: 18  Temp: (!) 97.5 F (36.4 C)  SpO2: 94%   Filed Weights   02/15/21 0918  Weight: 223 lb 12.8 oz (101.5 kg)    BREAST: No palpable masses or nodules in either right or left breasts. No palpable axillary supraclavicular or infraclavicular adenopathy no breast tenderness or nipple discharge. (exam performed in the presence of a chaperone)  LABORATORY DATA:  I have reviewed the data as listed CMP Latest Ref Rng & Units 01/21/2021 12/25/2020 12/11/2020  Glucose 70 - 99 mg/dL 113(H) 85 81  BUN 8 - 23 mg/dL 13 25 22   Creatinine 0.44 - 1.00 mg/dL 1.04(H) 1.15(H) 1.05(H)  Sodium 135 - 145 mmol/L 141 141 141  Potassium 3.5 - 5.1 mmol/L 4.0 4.3 4.3  Chloride 98 - 111 mmol/L 104 98 101  CO2 22 - 32 mmol/L 29 20 25   Calcium 8.9 - 10.3 mg/dL 9.2 9.8 9.1  Total Protein 6.5 - 8.1 g/dL 7.9 - 7.9  Total Bilirubin 0.3 - 1.2 mg/dL 0.3 - 0.4  Alkaline Phos 38 - 126 U/L 73 - 114  AST 15 - 41 U/L 27 - 38  ALT 0 - 44 U/L 25 - 30     Lab Results  Component Value Date   WBC 4.2 01/21/2021   HGB 11.8 (L) 01/21/2021   HCT 37.9 01/21/2021   MCV 96.9 01/21/2021   PLT 320 01/21/2021   NEUTROABS 2.0 01/21/2021    ASSESSMENT & PLAN:  Ductal carcinoma in situ (DCIS) of right breast 07/05/2018: Right lumpectomy: No residual carcinoma identified, right medial margin excision: DCIS high-grade 1.5 cm margins negative, additional margins benign, ER 90%, PR 90%, Tis NX stage 0   Current treatment: Anastrozole daily began in 08/2018 Anastrozole toxicities: Denies any adverse effects like hot flashes or myalgias or arthralgias.   Breast cancer surveillance: 1. Mammogram in 06/22/2020 benign 2. Bone density 03/2019 normal 3.  Breast exam 02/15/2021: Benign   Return to clinic in 1 year for follow-up    No orders of the defined types were  placed in this encounter.  The patient has a good understanding of the overall plan. she agrees with it. she will call with any problems that may develop before the next visit here.  Total time spent: 20 mins including face to face time and time spent for planning, charting and coordination of care  Rulon Eisenmenger, MD, MPH 02/15/2021  I, Thana Ates, am acting as scribe for Dr. Nicholas Lose.  I have reviewed the above documentation for accuracy and completeness, and I agree with the above.

## 2021-02-14 NOTE — Assessment & Plan Note (Signed)
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  Breast cancer surveillance: 1. Mammogram in 06/22/2020 benign 2. Bone density 03/2019 normal 3.  Breast exam 02/15/2021: Benign  Return to clinic in 1 year for follow-up

## 2021-02-15 ENCOUNTER — Inpatient Hospital Stay: Payer: Medicaid Other | Attending: Hematology and Oncology | Admitting: Hematology and Oncology

## 2021-02-15 ENCOUNTER — Other Ambulatory Visit: Payer: Self-pay

## 2021-02-15 DIAGNOSIS — Z79811 Long term (current) use of aromatase inhibitors: Secondary | ICD-10-CM | POA: Diagnosis not present

## 2021-02-15 DIAGNOSIS — Z8521 Personal history of malignant neoplasm of larynx: Secondary | ICD-10-CM | POA: Diagnosis not present

## 2021-02-15 DIAGNOSIS — C321 Malignant neoplasm of supraglottis: Secondary | ICD-10-CM | POA: Diagnosis not present

## 2021-02-15 DIAGNOSIS — Z79899 Other long term (current) drug therapy: Secondary | ICD-10-CM | POA: Diagnosis not present

## 2021-02-15 DIAGNOSIS — Z888 Allergy status to other drugs, medicaments and biological substances status: Secondary | ICD-10-CM | POA: Diagnosis not present

## 2021-02-15 DIAGNOSIS — Z923 Personal history of irradiation: Secondary | ICD-10-CM | POA: Insufficient documentation

## 2021-02-15 DIAGNOSIS — D0511 Intraductal carcinoma in situ of right breast: Secondary | ICD-10-CM | POA: Diagnosis not present

## 2021-02-15 DIAGNOSIS — Z930 Tracheostomy status: Secondary | ICD-10-CM | POA: Diagnosis not present

## 2021-02-15 DIAGNOSIS — C329 Malignant neoplasm of larynx, unspecified: Secondary | ICD-10-CM | POA: Diagnosis not present

## 2021-02-15 MED ORDER — ANASTROZOLE 1 MG PO TABS: 1.0000 mg | ORAL_TABLET | Freq: Every day | ORAL | 3 refills | Status: DC

## 2021-02-20 DIAGNOSIS — Z448 Encounter for fitting and adjustment of other external prosthetic devices: Secondary | ICD-10-CM | POA: Diagnosis not present

## 2021-02-20 DIAGNOSIS — Z8521 Personal history of malignant neoplasm of larynx: Secondary | ICD-10-CM | POA: Diagnosis not present

## 2021-02-20 DIAGNOSIS — Z9002 Acquired absence of larynx: Secondary | ICD-10-CM | POA: Diagnosis not present

## 2021-02-20 DIAGNOSIS — Z963 Presence of artificial larynx: Secondary | ICD-10-CM | POA: Diagnosis not present

## 2021-03-22 DIAGNOSIS — C321 Malignant neoplasm of supraglottis: Secondary | ICD-10-CM | POA: Diagnosis not present

## 2021-03-22 DIAGNOSIS — Z930 Tracheostomy status: Secondary | ICD-10-CM | POA: Diagnosis not present

## 2021-03-22 DIAGNOSIS — C329 Malignant neoplasm of larynx, unspecified: Secondary | ICD-10-CM | POA: Diagnosis not present

## 2021-03-26 ENCOUNTER — Other Ambulatory Visit: Payer: Self-pay

## 2021-03-26 ENCOUNTER — Ambulatory Visit (INDEPENDENT_AMBULATORY_CARE_PROVIDER_SITE_OTHER): Payer: Medicaid Other

## 2021-03-26 DIAGNOSIS — Z23 Encounter for immunization: Secondary | ICD-10-CM

## 2021-03-27 ENCOUNTER — Other Ambulatory Visit: Payer: Self-pay

## 2021-03-27 NOTE — Pre-Procedure Instructions (Addendum)
Surgical Instructions    Your procedure is scheduled on Monday 04/01/21.   Report to [facility] Main Entrance "A" at 10:20 A.M., then check in with the Admitting office.  Call this number if you have problems the morning of surgery:  [phone]   If you have any questions prior to your surgery date call [phone]: Open Monday-Friday 8am-4pm    Remember:  Do not eat after midnight the night before your surgery  You may drink clear liquids until 09:20 A.M. the morning of your surgery.   Clear liquids allowed are: Water, Non-Citrus Juices (without pulp), Carbonated Beverages, Clear Tea, Black Coffee ONLY (NO MILK, CREAM OR POWDERED CREAMER of any kind), and Gatorade  Patient Instructions  The night before surgery:  No food after midnight. ONLY clear liquids after midnight  The day of surgery (if you do NOT have diabetes):  Drink ONE (1) Pre-Surgery Clear Ensure by 09:20 A.M. the morning of surgery. Drink in one sitting. Do not sip.  This drink was given to you during your hospital  pre-op appointment visit.  Nothing else to drink after completing the  Pre-Surgery Clear Ensure.         If you have questions, please contact your surgeon's office.     Take these medicines the morning of surgery with A SIP OF WATER   amLODipine (NORVASC)   cetirizine (ZYRTEC)  escitalopram (LEXAPRO)  levothyroxine (SYNTHROID)  metoprolol tartrate (LOPRESSOR)  mometasone-formoterol (DULERA)  omeprazole (PRILOSEC)   Take these medicines if needed:   albuterol (PROAIR HFA) 108 (90 Base) MCG/ACT inhaler  docusate sodium (COLACE)  ondansetron (ZOFRAN)   As of today, STOP taking any Aspirin (unless otherwise instructed by your surgeon) Aleve, Naproxen, Ibuprofen, Motrin, Advil, Goody's, BC's, all herbal medications, fish oil, and all vitamins.     After your COVID test   You are not required to quarantine however you are required to wear a well-fitting mask when you are out and around  people not in your household.  If your mask becomes wet or soiled, replace with a new one.  Wash your hands often with soap and water for 20 seconds or clean your hands with an alcohol-based hand sanitizer that contains at least 60% alcohol.  Do not share personal items.  Notify your provider: if you are in close contact with someone who has COVID  or if you develop a fever of 100.4 or greater, sneezing, cough, sore throat, shortness of breath or body aches.             Do not wear jewelry or makeup Do not wear lotions, powders, perfumes/colognes, or deodorant. Do not shave 48 hours prior to surgery.  Men may shave face and neck. Do not bring valuables to the hospital. DO Not wear nail polish, gel polish, artificial nails, or any other type of covering on natural nails including finger and toenails. If patients have artificial nails, gel coating, etc. that need to be removed by a nail salon, please have this removed prior to surgery or surgery may need to be canceled/delayed if the surgeon/ anesthesia feels like the patient is unable to be adequately monitored.             [facility] is not responsible for any belongings or valuables.  Do NOT Smoke (Tobacco/Vaping)  24 hours prior to your procedure  If you use a CPAP at night, you may bring your mask for your overnight stay.   Contacts, glasses, hearing aids, dentures or  partials may not be worn into surgery, please bring cases for these belongings   For patients admitted to the hospital, discharge time will be determined by your treatment team.   Patients discharged the day of surgery will not be allowed to drive home, and someone needs to stay with them for 24 hours.  NO VISITORS WILL BE ALLOWED IN PRE-OP WHERE PATIENTS ARE PREPPED FOR SURGERY.  ONLY 1 SUPPORT PERSON MAY BE PRESENT IN THE WAITING ROOM WHILE YOU ARE IN SURGERY.  IF YOU ARE TO BE ADMITTED, ONCE YOU ARE IN YOUR ROOM YOU WILL BE ALLOWED TWO (2) VISITORS. 1 (ONE)  VISITOR MAY STAY OVERNIGHT BUT MUST ARRIVE TO THE ROOM BY 8pm.  Minor children may have two parents present. Special consideration for safety and communication needs will be reviewed on a case by case basis.  Special instructions:    Oral Hygiene is also important to reduce your risk of infection.  Remember - BRUSH YOUR TEETH THE MORNING OF SURGERY WITH YOUR REGULAR TOOTHPASTE   [facility]- Preparing For Surgery  Before surgery, you can play an important role. Because skin is not sterile, your skin needs to be as free of germs as possible. You can reduce the number of germs on your skin by washing with CHG (chlorahexidine gluconate) Soap before surgery.  CHG is an antiseptic cleaner which kills germs and bonds with the skin to continue killing germs even after washing.     Please do not use if you have an allergy to CHG or antibacterial soaps. If your skin becomes reddened/irritated stop using the CHG.  Do not shave (including legs and underarms) for at least 48 hours prior to first CHG shower. It is OK to shave your face.  Please follow these instructions carefully.     Shower the NIGHT BEFORE SURGERY and the MORNING OF SURGERY with CHG Soap.   If you chose to wash your hair, wash your hair first as usual with your normal shampoo. After you shampoo, rinse your hair and body thoroughly to remove the shampoo.  Then [employer] and genitals (private parts) with your normal soap and rinse thoroughly to remove soap.  After that Use CHG Soap as you would any other liquid soap. You can apply CHG directly to the skin and wash gently with a scrungie or a clean washcloth.   Apply the CHG Soap to your body ONLY FROM THE NECK DOWN.  Do not use on open wounds or open sores. Avoid contact with your eyes, ears, mouth and genitals (private parts). Wash Face and genitals (private parts)  with your normal soap.   Wash thoroughly, paying special attention to the area where your surgery will be  performed.  Thoroughly rinse your body with warm water from the neck down.  DO NOT shower/wash with your normal soap after using and rinsing off the CHG Soap.  Pat yourself dry with a CLEAN TOWEL.  Wear CLEAN PAJAMAS to bed the night before surgery  Place CLEAN SHEETS on your bed the night before your surgery  DO NOT SLEEP WITH PETS.   Day of Surgery:  Take a shower with CHG soap. Wear Clean/Comfortable clothing the morning of surgery Do not apply any deodorants/lotions.   Remember to brush your teeth WITH YOUR REGULAR TOOTHPASTE.   Please read over the following fact sheets that you were given.

## 2021-03-28 ENCOUNTER — Encounter (HOSPITAL_COMMUNITY)
Admission: RE | Admit: 2021-03-28 | Discharge: 2021-03-28 | Disposition: A | Discharge status: Home / Self Care | Payer: Medicaid Other | Source: Ambulatory Visit | Attending: Orthopaedic Surgery | Admitting: Orthopaedic Surgery

## 2021-03-28 ENCOUNTER — Other Ambulatory Visit: Payer: Self-pay

## 2021-03-28 DIAGNOSIS — Z20822 Contact with and (suspected) exposure to covid-19: Secondary | ICD-10-CM | POA: Diagnosis not present

## 2021-03-28 DIAGNOSIS — Z8616 Personal history of COVID-19: Secondary | ICD-10-CM | POA: Diagnosis not present

## 2021-03-28 DIAGNOSIS — Z01812 Encounter for preprocedural laboratory examination: Secondary | ICD-10-CM | POA: Diagnosis not present

## 2021-03-28 LAB — PROTIME-INR
INR: 1 (ref 0.8–1.2)
Prothrombin Time: 12.9 seconds (ref 11.4–15.2)

## 2021-03-28 LAB — CBC WITH DIFFERENTIAL/PLATELET
Abs Immature Granulocytes: 0.02 10*3/uL (ref 0.00–0.07)
Basophils Absolute: 0.1 10*3/uL (ref 0.0–0.1)
Basophils Relative: 1 %
Eosinophils Absolute: 0.3 10*3/uL (ref 0.0–0.5)
Eosinophils Relative: 5 %
HCT: 37.7 % (ref 36.0–46.0)
Hemoglobin: 12.2 g/dL (ref 12.0–15.0)
Immature Granulocytes: 0 %
Lymphocytes Relative: 32 %
Lymphs Abs: 1.9 10*3/uL (ref 0.7–4.0)
MCH: 30.7 pg (ref 26.0–34.0)
MCHC: 32.4 g/dL (ref 30.0–36.0)
MCV: 95 fL (ref 80.0–100.0)
Monocytes Absolute: 0.4 10*3/uL (ref 0.1–1.0)
Monocytes Relative: 6 %
Neutro Abs: 3.4 10*3/uL (ref 1.7–7.7)
Neutrophils Relative %: 56 %
Platelets: 377 10*3/uL (ref 150–400)
RBC: 3.97 MIL/uL (ref 3.87–5.11)
RDW: 12.5 % (ref 11.5–15.5)
WBC: 6 10*3/uL (ref 4.0–10.5)
nRBC: 0 % (ref 0.0–0.2)

## 2021-03-28 LAB — COMPREHENSIVE METABOLIC PANEL
ALT: 38 U/L (ref 0–44)
AST: 43 U/L — ABNORMAL HIGH (ref 15–41)
Albumin: 4.1 g/dL (ref 3.5–5.0)
Alkaline Phosphatase: 92 U/L (ref 38–126)
Anion gap: 10 (ref 5–15)
BUN: 19 mg/dL (ref 8–23)
CO2: 29 mmol/L (ref 22–32)
Calcium: 9.8 mg/dL (ref 8.9–10.3)
Chloride: 100 mmol/L (ref 98–111)
Creatinine, Ser: 1.1 mg/dL — ABNORMAL HIGH (ref 0.44–1.00)
GFR, Estimated: 56 mL/min — ABNORMAL LOW (ref >60)
Glucose, Bld: 96 mg/dL (ref 70–99)
Potassium: 4 mmol/L (ref 3.5–5.1)
Sodium: 139 mmol/L (ref 135–145)
Total Bilirubin: 0.2 mg/dL — ABNORMAL LOW (ref 0.3–1.2)
Total Protein: 8.4 g/dL — ABNORMAL HIGH (ref 6.5–8.1)

## 2021-03-28 LAB — URINALYSIS, ROUTINE W REFLEX MICROSCOPIC
Bilirubin Urine: NEGATIVE
Glucose, UA: NEGATIVE mg/dL
Hgb urine dipstick: NEGATIVE
Ketones, ur: NEGATIVE mg/dL
Leukocytes,Ua: NEGATIVE
Nitrite: NEGATIVE
Protein, ur: NEGATIVE mg/dL
Specific Gravity, Urine: 1.015 (ref 1.005–1.030)
pH: 6 (ref 5.0–8.0)

## 2021-03-28 LAB — SURGICAL PCR SCREEN
MRSA, PCR: POSITIVE — AB
Staphylococcus aureus: POSITIVE — AB

## 2021-03-28 LAB — APTT: aPTT: 27 seconds (ref 24–36)

## 2021-03-28 LAB — SARS CORONAVIRUS 2 (TAT 6-24 HRS): SARS Coronavirus 2: NEGATIVE

## 2021-03-28 NOTE — Progress Notes (Signed)
PCP - Dr. Zola Button Cardiologist - denies Oncologist- Dr. Nicholas Lose  PPM/ICD - n/a Device Orders - n/a Rep Notified - n/a   Chest x-ray - 01/21/21 EKG - 01/21/21 Stress Test - 10/07/17 ECHO - 08/31/17 Cardiac Cath - denies  Sleep Study - denies CPAP - n/a  Fasting Blood Sugar - n/a Checks Blood Sugar __ n/a __ times a day  Blood Thinner Instructions: n/a Aspirin Instructions: n/a  ERAS Protcol - Yes PRE-SURGERY Ensure or G2- Ensure  COVID TEST- 03/28/21. Pending.    Anesthesia review: Yes. Cardiac History. Patient has a trach. Originally scheduled for surgery in August and cancelled due to testing positive for COVID.   Patient denies shortness of breath, fever, cough and chest pain at PAT appointment   All instructions explained to the patient, with a verbal understanding of the material. Patient agrees to go over the instructions while at home for a better understanding. Patient also instructed to self quarantine after being tested for COVID-19. The opportunity to ask questions was provided.

## 2021-03-29 MED ORDER — TRANEXAMIC ACID 1000 MG/10ML IV SOLN
2000.0000 mg | INTRAVENOUS | Status: DC
  Filled 2021-03-29: qty 20

## 2021-03-29 NOTE — Anesthesia Preprocedure Evaluation (Addendum)
Anesthesia Evaluation  Patient identified by MRN, date of birth, ID band Patient awake    Reviewed: Allergy & Precautions, NPO status , Patient's Chart, lab work & pertinent test results  History of Anesthesia Complications Negative for: history of anesthetic complications  Airway Mallampati: Trach  TM Distance: >3 FB Neck ROM: Full    Dental no notable dental hx.    Pulmonary asthma , COPD, former smoker,  Laryngeal ca s/p radiation, laryngectomy, tracheostomy   Pulmonary exam normal        Cardiovascular hypertension, Pt. on medications +CHF  Normal cardiovascular exam  TTE 2019: moderate LVH, EF 65-70%, grade 2 DD, mild AR, PASP mildly increased (35 mmHg)   Neuro/Psych Seizures -,  Anxiety Depression    GI/Hepatic Neg liver ROS, GERD  Medicated and Controlled,  Endo/Other  Hypothyroidism   Renal/GU negative Renal ROS  negative genitourinary   Musculoskeletal  (+) Arthritis ,   Abdominal   Peds  Hematology negative hematology ROS (+)   Anesthesia Other Findings Day of surgery medications reviewed with patient.  Reproductive/Obstetrics negative OB ROS                           Anesthesia Physical Anesthesia Plan  ASA: 3  Anesthesia Plan: Spinal   Post-op Pain Management:  Regional for Post-op pain   Induction:   PONV Risk Score and Plan: 3 and Treatment may vary due to age or medical condition, Ondansetron, Propofol infusion, Dexamethasone and Midazolam  Airway Management Planned: Natural Airway and Simple Face Mask  Additional Equipment: None  Intra-op Plan:   Post-operative Plan:   Informed Consent: I have reviewed the patients History and Physical, chart, labs and discussed the procedure including the risks, benefits and alternatives for the proposed anesthesia with the patient or authorized representative who has indicated his/her understanding and acceptance.        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  Patient has had a number of procedures at [facility] without complication. Per last anesthesia procedure note 10/08/2020, 6.0 cuffed tube via stoma has been used to deliver anesthetic gas with success multiple times in the past.  Patient had cardiology evaluation in 2019, chronic lateral T wave inversions were noted at that time as well. She had nuclear stress test that was nonischemic, low risk and echo showing normal LVEF, grade 2 diastolic dysfunction, mild AR. She was advised to follow-up as needed based on benign testing.  Preop labs reviewed, unremarkable.  Pt was previously scheduled for this procedure in Aug 2022, but postponed do to positive covid on preop screening. She remained asymptomatic, required no treatment.   EKG 01/21/2021:Normal sinus rhythm. Rate 76. Possible Left atrial enlargement.T wave abnormality, consider lateral ischemia.No significant change since last tracing  CHEST - 2 VIEW8/01/2021:  COMPARISON: March 19, 2019.  FINDINGS: The heart size and mediastinal contours are within normal limits. Both lungs are clear. The visualized skeletal structures are unremarkable.  IMPRESSION: No active cardiopulmonary disease.  Nuclear stress test 10/07/17: . Nuclear stress EF: 61%. . There was no ST segment deviation noted during stress. . Defect 1: There is a medium defect of moderate severity present in the mid anteroseptal and apex location. . The study is normal. . This is a low  risk study. . The left ventricular ejection fraction is normal (55-65%).  Echo 08/31/17: -Left ventricle: The cavity size was mildly reduced. Wall  thickness was increased in a pattern of moderate LVH. Systolic function was vigorous. The estimated ejection fraction was in therange of 65% to 70%. Wall motion was normal; there were noregional wall motion abnormalities. Features are consistent witha pseudonormal left ventricular filling pattern, with concomitant abnormal relaxation and increased filling pressure (grade 2 diastolic dysfunction). - Aortic valve: There was mild regurgitation. - Pulmonary arteries: Systolic pressure was mildly increased. PA peak pressure: 35 mm Hg (S).  )      Anesthesia Quick Evaluation

## 2021-03-29 NOTE — Progress Notes (Signed)
Called and left a voicemail with Dr. 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

## 2021-03-29 NOTE — Progress Notes (Signed)
One gram of vanc added for surgical ppx due to mrsa positive swab

## 2021-03-29 NOTE — Progress Notes (Signed)
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   Patient has had a number of procedures at [facility] without complication.  Per last anesthesia procedure note 10/08/2020, 6.0 cuffed tube via stoma has been used to deliver anesthetic gas with success multiple times in the past.   Patient had cardiology evaluation in 2019, chronic lateral T wave inversions were noted at that time as well.  She had nuclear stress test that was nonischemic, low risk and echo showing normal LVEF, grade 2 diastolic dysfunction, mild AR.  She was advised to follow-up as needed based on benign testing.   Preop labs reviewed, unremarkable.  Pt was previously scheduled for this procedure in Aug 2022, but postponed do to positive covid on preop screening. She remained asymptomatic, required no treatment.    EKG 01/21/2021: Normal sinus rhythm.  Rate 76. Possible Left atrial enlargement. T wave abnormality, consider lateral ischemia. No significant change since last tracing   CHEST - 2 VIEW 01/21/2021:   COMPARISON:  March 19, 2019.   FINDINGS: The heart size and mediastinal contours are within normal limits. Both lungs are clear. The visualized skeletal structures are unremarkable.   IMPRESSION: No active cardiopulmonary disease.   Nuclear stress test 10/07/17: Nuclear stress EF: 61%. There was no ST segment deviation noted during stress. Defect 1: There is a medium defect of moderate severity present in the mid anteroseptal and apex location. The study is normal. This is a low risk study. The left ventricular ejection fraction is normal (55-65%).    Echo 08/31/17: - Left ventricle: The cavity size was mildly reduced. Wall thickness was increased in a pattern of moderate LVH. Systolic function was vigorous.  The estimated ejection fraction was in the range of 65% to 70%. Wall motion was normal; there were no regional wall motion abnormalities. Features are consistent with a pseudonormal left ventricular filling pattern, with concomitant abnormal relaxation and increased filling pressure (grade 2 diastolic dysfunction). - Aortic valve: There was mild regurgitation. - Pulmonary arteries: Systolic pressure was mildly increased. PA peak pressure: 35 mm Hg (S).    Deborah [name] [facility] Short Stay Center/Anesthesiology Phone [phone] 03/29/2021 10:31 AM

## 2021-03-31 DIAGNOSIS — M1712 Unilateral primary osteoarthritis, left knee: Secondary | ICD-10-CM

## 2021-04-01 ENCOUNTER — Encounter (HOSPITAL_COMMUNITY): Payer: Self-pay | Admitting: Orthopaedic Surgery

## 2021-04-01 ENCOUNTER — Ambulatory Visit (HOSPITAL_COMMUNITY): Payer: Medicaid Other | Admitting: Physician Assistant

## 2021-04-01 ENCOUNTER — Observation Stay (HOSPITAL_COMMUNITY)
Admission: RE | Admit: 2021-04-01 | Discharge: 2021-04-02 | Disposition: A | Discharge status: Home / Self Care | Payer: Medicaid Other | Source: Ambulatory Visit | Attending: Orthopaedic Surgery | Admitting: Orthopaedic Surgery

## 2021-04-01 ENCOUNTER — Encounter (HOSPITAL_COMMUNITY)
Admission: RE | Disposition: A | Discharge status: Home / Self Care | Payer: Self-pay | Source: Ambulatory Visit | Attending: Orthopaedic Surgery

## 2021-04-01 ENCOUNTER — Other Ambulatory Visit: Payer: Self-pay

## 2021-04-01 ENCOUNTER — Observation Stay (HOSPITAL_COMMUNITY): Payer: Medicaid Other

## 2021-04-01 ENCOUNTER — Ambulatory Visit (HOSPITAL_COMMUNITY): Payer: Medicaid Other | Admitting: Anesthesiology

## 2021-04-01 DIAGNOSIS — Z87891 Personal history of nicotine dependence: Secondary | ICD-10-CM | POA: Insufficient documentation

## 2021-04-01 DIAGNOSIS — Z853 Personal history of malignant neoplasm of breast: Secondary | ICD-10-CM | POA: Insufficient documentation

## 2021-04-01 DIAGNOSIS — G8918 Other acute postprocedural pain: Secondary | ICD-10-CM | POA: Diagnosis not present

## 2021-04-01 DIAGNOSIS — J45909 Unspecified asthma, uncomplicated: Secondary | ICD-10-CM | POA: Insufficient documentation

## 2021-04-01 DIAGNOSIS — R52 Pain, unspecified: Secondary | ICD-10-CM

## 2021-04-01 DIAGNOSIS — E039 Hypothyroidism, unspecified: Secondary | ICD-10-CM | POA: Insufficient documentation

## 2021-04-01 DIAGNOSIS — Z96652 Presence of left artificial knee joint: Secondary | ICD-10-CM

## 2021-04-01 DIAGNOSIS — M1712 Unilateral primary osteoarthritis, left knee: Secondary | ICD-10-CM | POA: Diagnosis not present

## 2021-04-01 DIAGNOSIS — J449 Chronic obstructive pulmonary disease, unspecified: Secondary | ICD-10-CM | POA: Insufficient documentation

## 2021-04-01 DIAGNOSIS — Z8521 Personal history of malignant neoplasm of larynx: Secondary | ICD-10-CM | POA: Insufficient documentation

## 2021-04-01 DIAGNOSIS — I10 Essential (primary) hypertension: Secondary | ICD-10-CM | POA: Diagnosis not present

## 2021-04-01 DIAGNOSIS — Z79899 Other long term (current) drug therapy: Secondary | ICD-10-CM | POA: Diagnosis not present

## 2021-04-01 DIAGNOSIS — Z96651 Presence of right artificial knee joint: Secondary | ICD-10-CM | POA: Insufficient documentation

## 2021-04-01 HISTORY — PX: TOTAL KNEE ARTHROPLASTY: SHX125

## 2021-04-01 SURGERY — ARTHROPLASTY, KNEE, TOTAL
Anesthesia: Spinal | Site: Knee | Laterality: Left

## 2021-04-01 MED ORDER — METHOCARBAMOL 1000 MG/10ML IJ SOLN
500.0000 mg | Freq: Four times a day (QID) | INTRAVENOUS | Status: DC | PRN
  Filled 2021-04-01: qty 5

## 2021-04-01 MED ORDER — HYDROMORPHONE HCL 1 MG/ML IJ SOLN
0.5000 mg | INTRAMUSCULAR | Status: DC | PRN
Start: 2021-04-01 — End: 2021-04-02

## 2021-04-01 MED ORDER — PHENYLEPHRINE 40 MCG/ML (10ML) SYRINGE FOR IV PUSH (FOR BLOOD PRESSURE SUPPORT)
PREFILLED_SYRINGE | INTRAVENOUS | Status: DC | PRN
  Administered 2021-04-01 (×5): 80 ug via INTRAVENOUS
  Administered 2021-04-01: 120 ug via INTRAVENOUS

## 2021-04-01 MED ORDER — OXYCODONE HCL 5 MG PO TABS: 10.0000 mg | ORAL_TABLET | ORAL | Status: DC | PRN

## 2021-04-01 MED ORDER — OXYCODONE HCL 5 MG/5ML PO SOLN: 5.0000 mg | Freq: Once | ORAL | Status: DC | PRN

## 2021-04-01 MED ORDER — OXYCODONE HCL 5 MG PO TABS: 5.0000 mg | ORAL_TABLET | Freq: Once | ORAL | Status: DC | PRN

## 2021-04-01 MED ORDER — FENTANYL CITRATE (PF) 100 MCG/2ML IJ SOLN: 25.0000 ug | INTRAMUSCULAR | Status: DC | PRN

## 2021-04-01 MED ORDER — MIDAZOLAM HCL 2 MG/2ML IJ SOLN
INTRAMUSCULAR | Status: AC
  Filled 2021-04-01: qty 2

## 2021-04-01 MED ORDER — ONDANSETRON HCL 4 MG/2ML IJ SOLN: 4.0000 mg | Freq: Four times a day (QID) | INTRAMUSCULAR | Status: DC | PRN

## 2021-04-01 MED ORDER — MIDAZOLAM HCL 2 MG/2ML IJ SOLN: 1.0000 mg | Freq: Once | INTRAMUSCULAR | Status: AC

## 2021-04-01 MED ORDER — IRRISEPT - 450ML BOTTLE WITH 0.05% CHG IN STERILE WATER, USP 99.95% OPTIME
TOPICAL | Status: DC | PRN
  Administered 2021-04-01: 450 mL via TOPICAL

## 2021-04-01 MED ORDER — KETOROLAC TROMETHAMINE 30 MG/ML IJ SOLN
INTRAMUSCULAR | Status: DC | PRN
  Administered 2021-04-01: 15 mg via INTRAVENOUS

## 2021-04-01 MED ORDER — SODIUM CHLORIDE 0.9 % IV SOLN: INTRAVENOUS | Status: DC

## 2021-04-01 MED ORDER — DEXAMETHASONE SODIUM PHOSPHATE 10 MG/ML IJ SOLN
INTRAMUSCULAR | Status: DC | PRN
  Administered 2021-04-01: 5 mg via INTRAVENOUS

## 2021-04-01 MED ORDER — IPRATROPIUM-ALBUTEROL 0.5-2.5 (3) MG/3ML IN SOLN: 3.0000 mL | RESPIRATORY_TRACT | Status: DC | PRN

## 2021-04-01 MED ORDER — ONDANSETRON HCL 4 MG/2ML IJ SOLN
INTRAMUSCULAR | Status: DC | PRN
  Administered 2021-04-01: 4 mg via INTRAVENOUS

## 2021-04-01 MED ORDER — EPHEDRINE SULFATE-NACL 50-0.9 MG/10ML-% IV SOSY
PREFILLED_SYRINGE | INTRAVENOUS | Status: DC | PRN
  Administered 2021-04-01 (×2): 10 mg via INTRAVENOUS

## 2021-04-01 MED ORDER — TRANEXAMIC ACID 1000 MG/10ML IV SOLN
INTRAVENOUS | Status: DC | PRN
  Administered 2021-04-01: 2000 mg via TOPICAL

## 2021-04-01 MED ORDER — FENTANYL CITRATE (PF) 100 MCG/2ML IJ SOLN
INTRAMUSCULAR | Status: AC
  Administered 2021-04-01: 50 ug via INTRAVENOUS
  Filled 2021-04-01: qty 2

## 2021-04-01 MED ORDER — ACETAMINOPHEN 500 MG PO TABS
1000.0000 mg | ORAL_TABLET | Freq: Four times a day (QID) | ORAL | Status: AC
  Administered 2021-04-01 – 2021-04-02 (×4): 1000 mg via ORAL
  Filled 2021-04-01 (×4): qty 2

## 2021-04-01 MED ORDER — VANCOMYCIN HCL IN DEXTROSE 1-5 GM/200ML-% IV SOLN
INTRAVENOUS | Status: AC
  Administered 2021-04-01: 1000 mg via INTRAVENOUS
  Filled 2021-04-01: qty 200

## 2021-04-01 MED ORDER — BUPIVACAINE IN DEXTROSE 0.75-8.25 % IT SOLN
INTRATHECAL | Status: DC | PRN
  Administered 2021-04-01: 1.6 mL via INTRATHECAL

## 2021-04-01 MED ORDER — MIDAZOLAM HCL 2 MG/2ML IJ SOLN
INTRAMUSCULAR | Status: DC | PRN
  Administered 2021-04-01 (×2): 1 mg via INTRAVENOUS

## 2021-04-01 MED ORDER — CEFAZOLIN SODIUM-DEXTROSE 2-4 GM/100ML-% IV SOLN
INTRAVENOUS | Status: AC
  Filled 2021-04-01: qty 100

## 2021-04-01 MED ORDER — METHOCARBAMOL 500 MG PO TABS
500.0000 mg | ORAL_TABLET | Freq: Four times a day (QID) | ORAL | Status: DC | PRN
  Administered 2021-04-01 (×2): 500 mg via ORAL
  Filled 2021-04-01 (×2): qty 1

## 2021-04-01 MED ORDER — VANCOMYCIN HCL IN DEXTROSE 1-5 GM/200ML-% IV SOLN: 1000.0000 mg | Freq: Once | INTRAVENOUS | Status: AC

## 2021-04-01 MED ORDER — PROMETHAZINE HCL 25 MG/ML IJ SOLN
6.2500 mg | INTRAMUSCULAR | Status: DC | PRN
Start: 2021-04-01 — End: 2021-04-01

## 2021-04-01 MED ORDER — MIDAZOLAM HCL 2 MG/2ML IJ SOLN
INTRAMUSCULAR | Status: AC
  Administered 2021-04-01: 1 mg via INTRAVENOUS
  Filled 2021-04-01: qty 2

## 2021-04-01 MED ORDER — CEFAZOLIN SODIUM-DEXTROSE 2-4 GM/100ML-% IV SOLN
2.0000 g | Freq: Four times a day (QID) | INTRAVENOUS | Status: AC
  Administered 2021-04-01 (×2): 2 g via INTRAVENOUS
  Filled 2021-04-01 (×2): qty 100

## 2021-04-01 MED ORDER — ACETAMINOPHEN 500 MG PO TABS
ORAL_TABLET | ORAL | Status: AC
  Administered 2021-04-01: 1000 mg via ORAL
  Filled 2021-04-01: qty 2

## 2021-04-01 MED ORDER — DEXAMETHASONE SODIUM PHOSPHATE 10 MG/ML IJ SOLN
10.0000 mg | Freq: Once | INTRAMUSCULAR | Status: AC
  Administered 2021-04-02: 10 mg via INTRAVENOUS
  Filled 2021-04-01: qty 1

## 2021-04-01 MED ORDER — METOCLOPRAMIDE HCL 5 MG PO TABS: 5.0000 mg | ORAL_TABLET | Freq: Three times a day (TID) | ORAL | Status: DC | PRN

## 2021-04-01 MED ORDER — ACETAMINOPHEN 500 MG PO TABS: 1000.0000 mg | ORAL_TABLET | Freq: Once | ORAL | Status: AC

## 2021-04-01 MED ORDER — TRANEXAMIC ACID-NACL 1000-0.7 MG/100ML-% IV SOLN
1000.0000 mg | INTRAVENOUS | Status: AC
  Administered 2021-04-01: 1000 mg via INTRAVENOUS

## 2021-04-01 MED ORDER — PROPOFOL 10 MG/ML IV BOLUS
INTRAVENOUS | Status: AC
  Filled 2021-04-01: qty 20

## 2021-04-01 MED ORDER — TRANEXAMIC ACID-NACL 1000-0.7 MG/100ML-% IV SOLN
1000.0000 mg | Freq: Once | INTRAVENOUS | Status: AC
  Administered 2021-04-01: 1000 mg via INTRAVENOUS
  Filled 2021-04-01: qty 100

## 2021-04-01 MED ORDER — PROPOFOL 10 MG/ML IV BOLUS
INTRAVENOUS | Status: DC | PRN
  Administered 2021-04-01: 40 mg via INTRAVENOUS

## 2021-04-01 MED ORDER — VANCOMYCIN HCL 1000 MG IV SOLR
INTRAVENOUS | Status: DC | PRN
  Administered 2021-04-01: 1000 mg via TOPICAL

## 2021-04-01 MED ORDER — BUPIVACAINE-MELOXICAM ER 400-12 MG/14ML IJ SOLN
INTRAMUSCULAR | Status: DC | PRN
  Administered 2021-04-01: 400 mg

## 2021-04-01 MED ORDER — MENTHOL 3 MG MT LOZG: 1.0000 | LOZENGE | OROMUCOSAL | Status: DC | PRN

## 2021-04-01 MED ORDER — ASPIRIN 81 MG PO CHEW
81.0000 mg | CHEWABLE_TABLET | Freq: Two times a day (BID) | ORAL | Status: DC
  Administered 2021-04-01 – 2021-04-02 (×2): 81 mg via ORAL
  Filled 2021-04-01 (×2): qty 1

## 2021-04-01 MED ORDER — LACTATED RINGERS IV SOLN: INTRAVENOUS | Status: DC

## 2021-04-01 MED ORDER — ONDANSETRON HCL 4 MG PO TABS: 4.0000 mg | ORAL_TABLET | Freq: Four times a day (QID) | ORAL | Status: DC | PRN

## 2021-04-01 MED ORDER — ACETAMINOPHEN 325 MG PO TABS: 325.0000 mg | ORAL_TABLET | Freq: Four times a day (QID) | ORAL | Status: DC | PRN

## 2021-04-01 MED ORDER — PHENOL 1.4 % MT LIQD: 1.0000 | OROMUCOSAL | Status: DC | PRN

## 2021-04-01 MED ORDER — 0.9 % SODIUM CHLORIDE (POUR BTL) OPTIME
TOPICAL | Status: DC | PRN
  Administered 2021-04-01: 1000 mL

## 2021-04-01 MED ORDER — VANCOMYCIN HCL 1000 MG IV SOLR
INTRAVENOUS | Status: AC
  Filled 2021-04-01: qty 20

## 2021-04-01 MED ORDER — CEFAZOLIN SODIUM-DEXTROSE 2-4 GM/100ML-% IV SOLN
2.0000 g | INTRAVENOUS | Status: AC
  Administered 2021-04-01: 2 g via INTRAVENOUS

## 2021-04-01 MED ORDER — POVIDONE-IODINE 10 % EX SWAB
2.0000 "application " | Freq: Once | CUTANEOUS | Status: AC
  Administered 2021-04-01: 2 via TOPICAL

## 2021-04-01 MED ORDER — OXYCODONE HCL 5 MG PO TABS
5.0000 mg | ORAL_TABLET | ORAL | Status: DC | PRN
  Administered 2021-04-01: 5 mg via ORAL
  Administered 2021-04-01 – 2021-04-02 (×2): 10 mg via ORAL
  Filled 2021-04-01 (×3): qty 2

## 2021-04-01 MED ORDER — METOCLOPRAMIDE HCL 5 MG/ML IJ SOLN: 5.0000 mg | Freq: Three times a day (TID) | INTRAMUSCULAR | Status: DC | PRN

## 2021-04-01 MED ORDER — SODIUM CHLORIDE 0.9 % IR SOLN
Status: DC | PRN
  Administered 2021-04-01: 1000 mL

## 2021-04-01 MED ORDER — BUPIVACAINE-MELOXICAM ER 400-12 MG/14ML IJ SOLN
INTRAMUSCULAR | Status: AC
  Filled 2021-04-01: qty 1

## 2021-04-01 MED ORDER — LEVOTHYROXINE SODIUM 100 MCG PO TABS
200.0000 ug | ORAL_TABLET | Freq: Every day | ORAL | Status: DC
  Administered 2021-04-02: 200 ug via ORAL
  Filled 2021-04-01 (×2): qty 2

## 2021-04-01 MED ORDER — PROPOFOL 500 MG/50ML IV EMUL
INTRAVENOUS | Status: DC | PRN
  Administered 2021-04-01: 60 ug/kg/min via INTRAVENOUS

## 2021-04-01 MED ORDER — DOCUSATE SODIUM 100 MG PO CAPS
100.0000 mg | ORAL_CAPSULE | Freq: Two times a day (BID) | ORAL | Status: DC
  Administered 2021-04-01 – 2021-04-02 (×2): 100 mg via ORAL
  Filled 2021-04-01 (×2): qty 1

## 2021-04-01 MED ORDER — CHLORHEXIDINE GLUCONATE 0.12 % MT SOLN
OROMUCOSAL | Status: AC
  Administered 2021-04-01: 15 mL
  Filled 2021-04-01: qty 15

## 2021-04-01 MED ORDER — LIDOCAINE 2% (20 MG/ML) 5 ML SYRINGE
INTRAMUSCULAR | Status: DC | PRN
  Administered 2021-04-01: 50 mg via INTRAVENOUS

## 2021-04-01 MED ORDER — TRANEXAMIC ACID-NACL 1000-0.7 MG/100ML-% IV SOLN
INTRAVENOUS | Status: AC
  Filled 2021-04-01: qty 100

## 2021-04-01 MED ORDER — FENTANYL CITRATE (PF) 100 MCG/2ML IJ SOLN: 50.0000 ug | Freq: Once | INTRAMUSCULAR | Status: AC

## 2021-04-01 SURGICAL SUPPLY — 78 items
ALCOHOL 70% 16 OZ (MISCELLANEOUS) ×2 IMPLANT
BAG COUNTER SPONGE SURGICOUNT (BAG) IMPLANT
BAG DECANTER FOR FLEXI CONT (MISCELLANEOUS) ×2 IMPLANT
BANDAGE ESMARK 6X9 LF (GAUZE/BANDAGES/DRESSINGS) IMPLANT
BLADE SAG 18X100X1.27 (BLADE) ×2 IMPLANT
BNDG ESMARK 6X9 LF (GAUZE/BANDAGES/DRESSINGS)
BOWL SMART MIX CTS (DISPOSABLE) ×2 IMPLANT
CEMENT BONE REFOBACIN R1X40 US (Cement) ×4 IMPLANT
CLSR STERI-STRIP ANTIMIC 1/2X4 (GAUZE/BANDAGES/DRESSINGS) ×4 IMPLANT
COMP FEM CEMT PERSONA STD SZ7 (Knees) ×2 IMPLANT
COMPONENT FEM CMT PRSN STD SZ7 (Knees) ×1 IMPLANT
COOLER ICEMAN CLASSIC (MISCELLANEOUS) ×2 IMPLANT
COVER SURGICAL LIGHT HANDLE (MISCELLANEOUS) ×2 IMPLANT
CUFF TOURN SGL QUICK 34 (TOURNIQUET CUFF) ×2
CUFF TOURN SGL QUICK 42 (TOURNIQUET CUFF) IMPLANT
CUFF TRNQT CYL 34X4.125X (TOURNIQUET CUFF) ×1 IMPLANT
DERMABOND ADVANCED (GAUZE/BANDAGES/DRESSINGS) ×1
DERMABOND ADVANCED .7 DNX12 (GAUZE/BANDAGES/DRESSINGS) ×1 IMPLANT
DRAPE EXTREMITY T 121X128X90 (DISPOSABLE) ×2 IMPLANT
DRAPE HALF SHEET 40X57 (DRAPES) ×2 IMPLANT
DRAPE INCISE IOBAN 66X45 STRL (DRAPES) ×2 IMPLANT
DRAPE ORTHO SPLIT 77X108 STRL (DRAPES) ×4
DRAPE POUCH INSTRU U-SHP 10X18 (DRAPES) ×2 IMPLANT
DRAPE SURG ORHT 6 SPLT 77X108 (DRAPES) ×2 IMPLANT
DRAPE U-SHAPE 47X51 STRL (DRAPES) ×4 IMPLANT
DRESSING AQUACEL AG SP 3.5X10 (GAUZE/BANDAGES/DRESSINGS) ×1 IMPLANT
DRSG AQUACEL AG ADV 3.5X10 (GAUZE/BANDAGES/DRESSINGS) ×2 IMPLANT
DRSG AQUACEL AG SP 3.5X10 (GAUZE/BANDAGES/DRESSINGS) ×2
DURAPREP 26ML APPLICATOR (WOUND CARE) ×6 IMPLANT
ELECT CAUTERY BLADE 6.4 (BLADE) ×2 IMPLANT
ELECT REM PT RETURN 9FT ADLT (ELECTROSURGICAL) ×2
ELECTRODE REM PT RTRN 9FT ADLT (ELECTROSURGICAL) ×1 IMPLANT
GLOVE SURG SYN 7.5  E (GLOVE) ×8
GLOVE SURG SYN 7.5 E (GLOVE) ×4 IMPLANT
GLOVE SURG UNDER LTX SZ7.5 (GLOVE) ×4 IMPLANT
GLOVE SURG UNDER POLY LF SZ7 (GLOVE) ×2 IMPLANT
GOWN STRL REIN XL XLG (GOWN DISPOSABLE) ×2 IMPLANT
GOWN STRL REUS W/ TWL LRG LVL3 (GOWN DISPOSABLE) ×1 IMPLANT
GOWN STRL REUS W/TWL LRG LVL3 (GOWN DISPOSABLE) ×2
HANDPIECE INTERPULSE COAX TIP (DISPOSABLE) ×2
HDLS TROCR DRIL PIN KNEE 75 (PIN) ×2
HOOD PEEL AWAY FLYTE STAYCOOL (MISCELLANEOUS) ×4 IMPLANT
JET LAVAGE IRRISEPT WOUND (IRRIGATION / IRRIGATOR) ×2
KIT BASIN OR (CUSTOM PROCEDURE TRAY) ×2 IMPLANT
KIT TURNOVER KIT B (KITS) ×2 IMPLANT
LAVAGE JET IRRISEPT WOUND (IRRIGATION / IRRIGATOR) ×1 IMPLANT
MANIFOLD NEPTUNE II (INSTRUMENTS) ×2 IMPLANT
MARKER SKIN DUAL TIP RULER LAB (MISCELLANEOUS) ×4 IMPLANT
NEEDLE SPNL 18GX3.5 QUINCKE PK (NEEDLE) ×2 IMPLANT
NS IRRIG 1000ML POUR BTL (IV SOLUTION) ×2 IMPLANT
PACK TOTAL JOINT (CUSTOM PROCEDURE TRAY) ×2 IMPLANT
PAD ARMBOARD 7.5X6 YLW CONV (MISCELLANEOUS) ×4 IMPLANT
PAD COLD SHLDR WRAP-ON (PAD) ×2 IMPLANT
PIN DRILL HDLS TROCAR 75 4PK (PIN) ×1 IMPLANT
SAW OSC TIP CART 19.5X105X1.3 (SAW) ×2 IMPLANT
SCREW FEMALE HEX FIX 25X2.5 (ORTHOPEDIC DISPOSABLE SUPPLIES) ×2 IMPLANT
SET HNDPC FAN SPRY TIP SCT (DISPOSABLE) ×1 IMPLANT
SPONGE T-LAP 18X18 ~~LOC~~+RFID (SPONGE) ×4 IMPLANT
STAPLER VISISTAT 35W (STAPLE) IMPLANT
STEM POLY PAT PLY 32M KNEE (Knees) ×2 IMPLANT
STEM TIBIA 5 DEG SZ E L KNEE (Knees) ×1 IMPLANT
STEM TIBIAL SZ6-7 E-F10 (Stem) ×2 IMPLANT
SUCTION FRAZIER HANDLE 10FR (MISCELLANEOUS) ×2
SUCTION TUBE FRAZIER 10FR DISP (MISCELLANEOUS) ×1 IMPLANT
SUT ETHILON 2 0 FS 18 (SUTURE) ×2 IMPLANT
SUT MNCRL AB 4-0 PS2 18 (SUTURE) IMPLANT
SUT VIC AB 0 CT1 27 (SUTURE) ×4
SUT VIC AB 0 CT1 27XBRD ANBCTR (SUTURE) ×2 IMPLANT
SUT VIC AB 1 CTX 27 (SUTURE) ×6 IMPLANT
SUT VIC AB 2-0 CT1 27 (SUTURE) ×12
SUT VIC AB 2-0 CT1 TAPERPNT 27 (SUTURE) ×6 IMPLANT
SYR 50ML LL SCALE MARK (SYRINGE) ×4 IMPLANT
TIBIA STEM 5 DEG SZ E L KNEE (Knees) ×2 IMPLANT
TOWEL GREEN STERILE (TOWEL DISPOSABLE) ×2 IMPLANT
TOWEL GREEN STERILE FF (TOWEL DISPOSABLE) ×2 IMPLANT
TRAY CATH 16FR W/PLASTIC CATH (SET/KITS/TRAYS/PACK) IMPLANT
UNDERPAD 30X36 HEAVY ABSORB (UNDERPADS AND DIAPERS) ×2 IMPLANT
YANKAUER SUCT BULB TIP NO VENT (SUCTIONS) ×4 IMPLANT

## 2021-04-01 NOTE — Plan of Care (Signed)

## 2021-04-01 NOTE — Anesthesia Procedure Notes (Signed)
Anesthesia Regional Block: Adductor canal block   Pre-Anesthetic Checklist: , timeout performed,  Correct Patient, Correct Site, Correct Laterality,  Correct Procedure, Correct Position, site marked,  Risks and benefits discussed,  Pre-op evaluation,  At surgeon's request and post-op pain management  Laterality: Left  Prep: Maximum Sterile Barrier Precautions used, chloraprep       Needles:  Injection technique: Single-shot  Needle Type: Echogenic Stimulator Needle     Needle Length: 9cm  Needle Gauge: 22     Additional Needles:   Procedures:,,,, ultrasound used (permanent image in chart),,    Narrative:  Start time: 04/01/2021 9:06 AM End time: 04/01/2021 9:09 AM Injection made incrementally with aspirations every 5 mL.  Performed by: Personally  Anesthesiologist: Brennan Bailey, MD  Additional Notes: Risks, benefits, and alternative discussed. Patient gave consent for procedure. Patient prepped and draped in sterile fashion. Sedation administered, patient remains easily responsive to voice. Relevant anatomy identified with ultrasound guidance. Local anesthetic given in 5cc increments with no signs or symptoms of intravascular injection. No pain or paraesthesias with injection. Patient monitored throughout procedure with signs of LAST or immediate complications. Tolerated well. Ultrasound image placed in chart.  Tawny Asal, MD

## 2021-04-01 NOTE — Evaluation (Signed)
Physical Therapy Evaluation Patient Details Name: Deborah [name] MRN: [id number] DOB: 10/21/1955 Today's Date: 04/01/2021  History of Present Illness  Pt adm 10/17 for lt TKR. PMH - Rt TKR, breast CA, larynx CA,   Tracheostomy dependent, HTN, hx of breast and larynx cancer, chronic trach  Clinical Impression  Pt doing very well after lt TKR and with minimal change from her baseline. Pt extremely motivated to return to independence. Excellent knee ROM 0-100 degrees in sitting.       Recommendations for follow up therapy are one component of a multi-disciplinary discharge planning process, led by the attending physician.  Recommendations may be updated based on patient status, additional functional criteria and insurance authorization.  Follow Up Recommendations Follow surgeon's recommendation for DC plan and follow-up therapies    Equipment Recommendations  None recommended by PT    Recommendations for Other Services       Precautions / Restrictions Precautions Precautions: None;Knee Restrictions Weight Bearing Restrictions: Yes RLE Weight Bearing: Weight bearing as tolerated      Mobility  Bed Mobility Overal bed mobility: Modified Independent                  Transfers Overall transfer level: Needs assistance Equipment used: Rolling walker (2 wheeled) Transfers: Sit to/from Stand Sit to Stand: Supervision         General transfer comment: supervision for safety/lines  Ambulation/Gait Ambulation/Gait assistance: Supervision Gait Distance (Feet): 200 Feet Assistive device: Rolling walker (2 wheeled) Gait Pattern/deviations: Step-through pattern;Decreased stride length Gait velocity: decr Gait velocity interpretation: 1.31 - 2.62 ft/sec, indicative of limited community ambulator General Gait Details: Steady gait with walker. Supervision for lines/safety  Stairs            Wheelchair Mobility    Modified Rankin (Stroke Patients Only)        Balance Overall balance assessment: No apparent balance deficits (not formally assessed)                                           Pertinent Vitals/Pain Pain Assessment: 0-10 Pain Score: 2  Pain Location: lt knee Pain Descriptors / Indicators: Discomfort Pain Intervention(s): Limited activity within patient's tolerance    Home Living Family/patient expects to be discharged to:: Private residence Living Arrangements: Spouse/significant other;Children Available Help at Discharge: Family Type of Home: House Home Access: Stairs to enter Entrance Stairs-Rails: Right Entrance Stairs-Number of Steps: 2 Home Layout: One level Home Equipment: Environmental consultant - 2 wheels      Prior Function Level of Independence: Independent               Hand Dominance        Extremity/Trunk Assessment   Upper Extremity Assessment Upper Extremity Assessment: Overall WFL for tasks assessed    Lower Extremity Assessment Lower Extremity Assessment: LLE deficits/detail LLE Deficits / Details: excellent quad set, knee AROM 0-100 degrees       Communication   Communication: Tracheostomy  Cognition Arousal/Alertness: Awake/alert Behavior During Therapy: WFL for tasks assessed/performed Overall Cognitive Status: Within Functional Limits for tasks assessed                                        General Comments General comments (skin integrity, edema, etc.): pt with trach on trach collar but  doesn't wear at home. SpO2 97% with or without trach collar    Exercises Total Joint Exercises Quad Sets: AROM;Left;5 reps;Supine Long Arc Quad: AROM;Left;5 reps;Seated Knee Flexion: AROM;Left;5 reps;Seated Goniometric ROM: 0-100 active seated   Assessment/Plan    PT Assessment Patient needs continued PT services  PT Problem List Decreased strength;Decreased mobility;Decreased range of motion       PT Treatment Interventions DME instruction;Functional mobility  training;Patient/family education;Gait training;Therapeutic activities;Stair training;Therapeutic exercise    PT Goals (Current goals can be found in the Care Plan section)  Acute Rehab PT Goals Patient Stated Goal: return home PT Goal Formulation: With patient Time For Goal Achievement: 04/04/21 Potential to Achieve Goals: Good    Frequency 7X/week   Barriers to discharge        Co-evaluation               AM-PAC PT "6 Clicks" Mobility  Outcome Measure Help needed turning from your back to your side while in a flat bed without using bedrails?: None Help needed moving from lying on your back to sitting on the side of a flat bed without using bedrails?: None Help needed moving to and from a bed to a chair (including a wheelchair)?: A Little Help needed standing up from a chair using your arms (e.g., wheelchair or bedside chair)?: A Little Help needed to walk in hospital room?: A Little Help needed climbing 3-5 steps with a railing? : A Little 6 Click Score: 20    End of Session Equipment Utilized During Treatment: Gait belt Activity Tolerance: Patient tolerated treatment well Patient left: in chair;with call bell/phone within reach;with family/visitor present Nurse Communication: Mobility status PT Visit Diagnosis: Other abnormalities of gait and mobility (R26.89)    Time: 5697-9480 PT Time Calculation (min) (ACUTE ONLY): 20 min   Charges:   PT Evaluation $PT Eval Low Complexity: 1 Low          [place] Pager [phone] Office [place] 04/01/2021, 6:24 PM

## 2021-04-01 NOTE — H&P (Signed)
PREOPERATIVE H&P  Chief Complaint: left knee degenerative joint disease  HPI: Deborah [name] is a 65 y.o. female who presents for surgical treatment of left knee degenerative joint disease.  She denies any changes in medical history.  Past Medical History:  Diagnosis Date   Anemia    h/o of   Anxiety    Arthritis    in knees   Asthma    Breast cancer ([place])    Bronchitis    COPD (chronic obstructive pulmonary disease) ([place])    Diverticulosis 11/15/2012   noted on screening colonoscopy    Dyspnea    with COPD exerbation   Esophageal stricture    Former smoker 03/19/2011   GERD (gastroesophageal reflux disease)    Heart murmur    asymptomatic    History of laryngectomy    History of radiation therapy 11/15/18- 12/06/18   Right Breast total dose 42.56 Gy in 16 fractions.    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   Leukocytopenia    Nausea alone 07/28/2013   Neck pain 01/21/2012   Normal MRI 07/14/2011   negative for mestasis    Pneumonia 2012   Sciatica    Seizures ([place])    07/24/11 off Effexor w/o seizure   Sepsis ([place]) 08/04/2012   Sinusitis, chronic 07/20/2011   Bilateral maxillary, identified on MRI of head 07/14/11.     Tracheostomy dependent [facility])    Past Surgical History:  Procedure Laterality Date   BREAST LUMPECTOMY Right 07/06/2018   BREAST LUMPECTOMY WITH RADIOACTIVE SEED LOCALIZATION Right 07/06/2018   Procedure: RIGHT BREAST LUMPECTOMY WITH RADIOACTIVE SEED LOCALIZATION;  Surgeon: Rolm Bookbinder, MD;  Location: [place];  Service: General;  Laterality: Right;   COLONOSCOPY N/A 11/15/2012   Procedure: COLONOSCOPY;  Surgeon: Lafayette Dragon, MD;  [facility];  Service: Endoscopy;  Laterality: N/A;    DENTAL RESTORATION/EXTRACTION WITH X-RAY     ESOPHAGEAL DILATION N/A 09/09/2019   Procedure: ESOPHAGEAL DILATION;  Surgeon: [name], MD;  Location: [place];  Service: ENT;  Laterality: N/A;   ESOPHAGEAL DILATION N/A 10/05/2019   Procedure: ESOPHAGEAL DILATION;  Surgeon: [name], MD;  Location: [place];  Service: ENT;  Laterality: N/A;  via tracheostomy   ESOPHAGEAL DILATION N/A 02/27/2020   Procedure: ESOPHAGEAL DILATION;  Surgeon: [name], MD;  Location: [place];  Service: ENT;  Laterality: N/A;   ESOPHAGOGASTRODUODENOSCOPY (EGD) WITH PROPOFOL N/A 08/12/2018   Procedure: ESOPHAGOGASTRODUODENOSCOPY (EGD) WITH PROPOFOL;  Surgeon: Doran Stabler, MD;  Location: [place];  Service: Gastroenterology;  Laterality: N/A;   ESOPHAGOSCOPY  06/21/2012   Procedure: ESOPHAGOSCOPY;  Surgeon: [name], MD;  Location: [place];  Service: ENT;  Laterality: N/A;   ESOPHAGOSCOPY WITH DILITATION N/A 09/21/2014   Procedure: ESOPHAGOSCOPY WITH DILITATION;  Surgeon: [name], MD;  Location: [place];  Service: ENT;  Laterality: N/A;   ESOPHAGOSCOPY WITH DILITATION N/A 07/04/2016   Procedure: ESOPHAGOSCOPY WITH DILITATION;  Surgeon: [name], MD;  Location: [place];  Service: ENT;  Laterality: N/A;   ESOPHAGOSCOPY WITH DILITATION N/A 12/01/2017   Procedure: ESOPHAGOSCOPY WITH DILITATION;  Surgeon: [name], MD;  Location: [place];  Service: ENT;  Laterality: N/A;   ESOPHAGOSCOPY WITH DILITATION N/A 04/19/2018   Procedure: ESOPHAGOSCOPY WITH DILITATION;  Surgeon: [name], MD;  Location: [place];  Service: ENT;  Laterality: N/A;   ESOPHAGOSCOPY WITH DILITATION N/A 03/07/2019   Procedure: Esophagoscopy with dilatation;  Surgeon: [name], MD;  Location: [place];  Service: ENT;  Laterality: N/A;   ESOPHAGOSCOPY WITH DILITATION N/A 07/16/2020   Procedure: ESOPHAGOSCOPY WITH DILATION;  Surgeon: [name],  MD;  Location: [place];  Service: ENT;  Laterality: N/A;   ESOPHAGOSCOPY WITH DILITATION N/A 10/08/2020   Procedure: ESOPHAGOSCOPY With Dilation;  Surgeon: [name], MD;  Location: [place];  Service: ENT;  Laterality: N/A;  [address] to [place]  01/08/2018       FOREIGN BODY REMOVAL BRONCHIAL  10/02/2011   Procedure: REMOVAL FOREIGN BODY BRONCHIAL;  Surgeon: Ruby Cola, MD;  Location: [place];  Service: ENT;  Laterality: N/A;   FOREIGN BODY REMOVAL BRONCHIAL N/A 01/08/2018   Procedure: REMOVAL FOREIGN BODY BRONCHIAL;  Surgeon: Jodi Marble, MD;  [facility];  Service: ENT;  Laterality: N/A;   LARYNGECTOMY     Porta cath removal     PORTACATH PLACEMENT  09/17/10   Tip in cavoatrial junction   RE-EXCISION OF BREAST CANCER,SUPERIOR MARGINS Right 07/29/2018   Procedure: RE-EXCISION OF RIGHT BREAST MEDIAL MARGINS;  Surgeon: Rolm Bookbinder, MD;  Location: [place];  Service: General;  Laterality: Right;   RIGID ESOPHAGOSCOPY N/A 03/19/2019   Procedure: FLEXIBLE ESOPHAGOSCOPY;  Surgeon: Jodi Marble, MD;  Location: [place];  Service: ENT;  Laterality: N/A;   STOMAPLASTY N/A 10/21/2016   Procedure: Zola Button;  Surgeon: [name], MD;  Location: [place];  Service: ENT;  Laterality: N/A;   TOTAL KNEE ARTHROPLASTY Right 12/12/2019   Procedure: RIGHT TOTAL KNEE ARTHROPLASTY;  Surgeon: Leandrew Koyanagi, MD;  Location: [place];  Service: Orthopedics;  Laterality: Right;   TRACHEAL DILITATION  07/16/2011   Procedure: TRACHEAL DILITATION;  Surgeon: Beckie Salts, MD;  Location: [place];  Service: ENT;  Laterality: N/A;  dilation of tracheal stoma and replacement of stoma tube   TUBAL LIGATION  1982   Social History   Socioeconomic History   Marital status: Divorced    Spouse name: Not on file   Number of children: 2   Years of education: 12   Highest education level: Not on file  Occupational History   Occupation: part time    Comment:  warehouse work  Tobacco Use   Smoking status: Former    Packs/day: 2.00    Years: 20.00    Pack years: 40.00    Types: Cigarettes    Quit date: 09/17/2010    Years since quitting: 10.5   Smokeless tobacco: Never  Vaping Use   Vaping Use: Never used  Substance and Sexual Activity   Alcohol use: Yes    Comment: Socially drinks    Drug use: No    Comment: tried cocaine 1 time 2010 only used 1 time   Sexual activity: Yes    Birth control/protection: Surgical  Other Topics Concern   Not on file  Social History Narrative   Lives with boyfriend   Social Determinants of Health   Financial Resource Strain: Not on file  Food Insecurity: Not on file  Transportation Needs: Not on file  Physical Activity: Not on file  Stress: Not on file  Social Connections: Not on file   Family History  Problem Relation Age of Onset   Heart disease Mother    Heart disease Father    Heart disease Sister    Cancer Brother        type unknown   Liver disease Maternal Grandmother    Cancer Sister        type unknown   Diabetes Sister    Breast cancer Neg Hx    Allergies  Allergen Reactions   Lisinopril Cough   Prior to Admission medications   Medication Sig Start Date End Date Taking? Authorizing Provider  albuterol (PROAIR HFA) 108 (90 Base) MCG/ACT inhaler Inhale 2 puffs into the lungs every 4 (four) hours as needed for wheezing or shortness of breath. 12/11/20  Yes Benay Pike, MD  albuterol (PROVENTIL) (5 MG/ML) 0.5% nebulizer solution Take 0.5 mLs (2.5 mg total) by nebulization every 6 (six) hours as needed for wheezing or shortness of breath. Please give the patient 10 47m vials. 12/11/20  Yes OBenay Pike MD  amLODipine (NORVASC) 10 MG tablet Take 1 tablet by mouth daily Patient taking differently: Take 10 mg by mouth daily. 12/11/20  Yes OBenay Pike MD  anastrozole (ARIMIDEX) 1 MG tablet Take 1 tablet (1 mg total) by mouth daily. 02/15/21  Yes GNicholas Lose MD  cetirizine  (ZYRTEC) 10 MG tablet Take 1 tablet (10 mg total) by mouth daily. 12/20/20  Yes SZola Button MD  escitalopram (LEXAPRO) 10 MG tablet Take 1 tablet (10 mg total) by mouth daily. 12/25/20  Yes SZola Button MD  Humidifiers MISC 1 application by Tracheal Tube route as needed. 02/05/21  Yes SZola Button MD  ipratropium-albuterol (DUONEB) 0.5-2.5 (3) MG/3ML SOLN Take 3 mLs by nebulization every 4 (four) hours as needed. 07/19/20  Yes BMartyn Malay MD  levothyroxine (SYNTHROID) 200 MCG tablet Take 1 tablet (200 mcg total) by mouth daily before breakfast. 12/18/20  Yes Hensel, WJamal Collin MD  losartan-hydrochlorothiazide (HYZAAR) 100-12.5 MG tablet Take 1 tablet by mouth daily. 12/25/20  Yes SZola Button MD  metoprolol tartrate (LOPRESSOR) 50 MG tablet Take 1 tablet (50 mg total) by mouth 2 (two) times daily. 12/20/20  Yes SZola Button MD  mometasone-formoterol ([facility] 200-5 MCG/ACT AERO Inhale 2 puffs into the lungs 2 (two) times daily. 12/11/20  Yes OBenay Pike MD  nystatin cream (MYCOSTATIN) APPLY  CREAM TOPICALLY TWICE DAILY AS NEEDED Patient taking differently: Apply 1 application topically daily as needed for dry skin. 09/20/19  Yes OBenay Pike MD  omeprazole (PRILOSEC) 40 MG capsule Take 1 capsule (40 mg total) by mouth 2 (two) times daily. 12/25/20  Yes SZola Button MD  aspirin EC 81 MG tablet Take 1 tablet (81 mg total) by mouth 2 (two) times daily. To be taken after surgery 01/21/21   SAundra Dubin PA-C  benzonatate (TESSALON) 100 MG capsule TAKE 1 CAPSULE BY MOUTH TWICE DAILY AS NEEDED FOR COUGH Patient not taking: Reported on 03/27/2021 12/11/20   OBenay Pike MD  docusate sodium (COLACE) 100 MG capsule Take 1 capsule (100 mg total) by mouth daily as needed. 01/21/21 01/21/22  SAundra Dubin PA-C  methocarbamol (ROBAXIN) 500 MG tablet Take 1 tablet (500 mg total) by mouth 2 (two) times daily as needed. To be taken after surgery 01/21/21   SAundra Dubin PA-C  ondansetron (ZOFRAN) 4 MG  tablet Take 1 tablet (4 mg total) by mouth every 8 (eight) hours as needed for nausea or vomiting. 01/21/21   SAundra Dubin PA-C  oxyCODONE-acetaminophen (PERCOCET) 5-325 MG tablet Take 1-2 tablets by mouth every 6 (six) hours as needed. To be taken after surgery 01/21/21   Aundra Dubin, PA-C     Positive ROS: All other systems have been reviewed and were otherwise negative with the exception of those mentioned in the HPI and as above.  Physical Exam: General: Alert, no acute distress Cardiovascular: No pedal edema Respiratory: No cyanosis, no use of accessory musculature GI: abdomen soft Skin: No lesions in the area of chief complaint Neurologic: Sensation intact distally Psychiatric: Patient is competent for consent with normal mood and affect Lymphatic: no lymphedema  MUSCULOSKELETAL: exam stable  Assessment: left knee degenerative joint disease  Plan: Plan for Procedure(s): LEFT TOTAL KNEE ARTHROPLASTY  The risks benefits and alternatives were discussed with the patient including but not limited to the risks of nonoperative treatment, versus surgical intervention including infection, bleeding, nerve injury,  blood clots, cardiopulmonary complications, morbidity, mortality, among others, and they were willing to proceed.   Preoperative templating of the joint replacement has been completed, documented, and submitted to the Operating Room personnel in order to optimize intra-operative equipment management.   Eduard Roux, MD 04/01/2021 8:46 AM

## 2021-04-01 NOTE — Transfer of Care (Signed)
Immediate Anesthesia Transfer of Care Note  Patient: Grace Blight Challis  Procedure(s) Performed: LEFT TOTAL KNEE ARTHROPLASTY (Left: Knee)  Patient Location: PACU  Anesthesia Type:General  Level of Consciousness: awake, alert , oriented and pateint uncooperative  Airway & Oxygen Therapy: Patient Spontanous Breathing and Patient connected to tracheostomy mask oxygen  Post-op Assessment: Report given to RN and Post -op Vital signs reviewed and stable  Post vital signs: Reviewed and stable  Last Vitals:  Vitals Value Taken Time  BP 125/58 04/01/21 1211  Temp    Pulse 66 04/01/21 1218  Resp 17 04/01/21 1218  SpO2 100 % 04/01/21 1218  Vitals shown include unvalidated device data.  Last Pain:  Vitals:   04/01/21 0823  TempSrc:   PainSc: 0-No pain         Complications: No notable events documented.

## 2021-04-01 NOTE — Anesthesia Postprocedure Evaluation (Signed)
Anesthesia Post Note  Patient: Deborah [name]  Procedure(s) Performed: LEFT TOTAL KNEE ARTHROPLASTY (Left: Knee)     Patient location during evaluation: PACU Anesthesia Type: Spinal Level of consciousness: awake and alert and oriented Pain management: pain level controlled Vital Signs Assessment: post-procedure vital signs reviewed and stable Respiratory status: spontaneous breathing, nonlabored ventilation and respiratory function stable Cardiovascular status: blood pressure returned to baseline Postop Assessment: no apparent nausea or vomiting, spinal receding, no headache and no backache Anesthetic complications: no   No notable events documented.  Last Vitals:  Vitals:   04/01/21 1225 04/01/21 1230  BP: 127/68 127/68  Pulse: 63 61  Resp: 17 15  Temp:    SpO2: 100% 100%    Last Pain:  Vitals:   04/01/21 1225  TempSrc:   PainSc: 0-No pain                 Marthenia Rolling

## 2021-04-01 NOTE — Op Note (Signed)
Total Knee Arthroplasty Procedure Note  Preoperative diagnosis: Left knee osteoarthritis  Postoperative diagnosis:same  Operative procedure: Left total knee arthroplasty. CPT [id number]  Surgeon: N. Eduard Roux, MD  Assist: Madalyn Rob, PA-C; necessary for the timely completion of procedure and due to complexity of procedure.  Anesthesia: Spinal, regional, local  Tourniquet time: see anesthesia record  Implants used: Zimmer persona Femur: CR 7 Tibia: E Patella: 32 mm Polyethylene: 10 mm, MC  Indication: Deborah [name] is a 65 y.o. year old female with a history of knee pain. Having failed conservative management, the patient elected to proceed with a total knee arthroplasty.  We have reviewed the risk and benefits of the surgery and they elected to proceed after voicing understanding.  Procedure:  After informed consent was obtained and understanding of the risk were voiced including but not limited to bleeding, infection, damage to surrounding structures including nerves and vessels, blood clots, leg length inequality and the failure to achieve desired results, the operative extremity was marked with verbal confirmation of the patient in the holding area.   The patient was then brought to the operating room and transported to the operating room table in the supine position.  A tourniquet was applied to the operative extremity around the upper thigh. The operative limb was then prepped and draped in the usual sterile fashion and preoperative antibiotics were administered.  A time out was performed prior to the start of surgery confirming the correct extremity, preoperative antibiotic administration, as well as team members, implants and instruments available for the case. Correct surgical site was also confirmed with preoperative radiographs. The limb was then elevated for exsanguination and the tourniquet was inflated. A midline incision was made and a standard medial  parapatellar approach was performed.  The infrapatellar fat pad was removed.  Suprapatellar synovium was removed to reveal the anterior distal femoral cortex.  A medial peel was performed to release the capsule of the medial tibial plateau.  The patella showed severe wear and osteophyte formation was then everted and was prepared and sized to a 32 mm.  A cover was placed on the patella for protection from retractors.  The knee was then brought into full flexion which showed severe wear and we then turned our attention to the femur.  The cruciates were sacrificed.  Start site was drilled in the femur and the intramedullary distal femoral cutting guide was placed, set at 5 degrees valgus, taking 10 mm of distal resection. The distal cut was made. Osteophytes were then removed.  Next, the proximal tibial cutting guide was placed with appropriate slope, varus/valgus alignment and depth of resection. The proximal tibial cut was made. Gap blocks were then used to assess the extension gap and alignment, and appropriate soft tissue releases were performed. Attention was turned back to the femur, which was sized using the sizing guide to a size 7. Appropriate rotation of the femoral component was determined using epicondylar axis, Whiteside's line, and assessing the flexion gap under ligament tension. The appropriate size 4-in-1 cutting block was placed and checked with an angel wing and cuts were made. Posterior femoral osteophytes and uncapped bone were then removed with the curved osteotome.  Trial components were placed, and stability was checked in full extension, mid-flexion, and deep flexion. Proper tibial rotation was determined and marked.  The patella tracked well without a lateral release.  The femoral lugs were then drilled. Trial components were then removed and tibial preparation performed.  The tibia was  sized for a size E component.   The bony surfaces were irrigated with a pulse lavage and then dried.  Bone cement was vacuum mixed on the back table, and the final components sized above were cemented into place.  Antibiotic irrigation was placed in the knee joint and soft tissues while the cement cured.  After cement had finished curing, excess cement was removed. The stability of the construct was re-evaluated throughout a range of motion and found to be acceptable. The trial liner was removed, the knee was copiously irrigated, and the knee was re-evaluated for any excess bone debris. The real polyethylene liner, 10 mm thick, was inserted and checked to ensure the locking mechanism had engaged appropriately. The tourniquet was deflated and hemostasis was achieved. The wound was irrigated with normal saline.  One gram of vancomycin powder was placed in the surgical bed.  Capsular closure was performed with a #1 vicryl, subcutaneous fat closed with a 0 vicryl suture, then subcutaneous tissue closed with interrupted 2.0 vicryl suture. The skin was then closed with a 2.0 nylon and dermabond. A sterile dressing was applied.  The patient was awakened in the operating room and taken to recovery in stable condition. All sponge, needle, and instrument counts were correct at the end of the case.  [name] was necessary for opening, closing, retracting, limb positioning and overall facilitation and completion of the surgery.  Position: supine  Complications: none.  Time Out: performed   Drains/Packing: none  Estimated blood loss: minimal  Returned to Recovery Room: in good condition.   Antibiotics: yes   Mechanical VTE (DVT) Prophylaxis: sequential compression devices, TED thigh-high  Chemical VTE (DVT) Prophylaxis: aspirin  Fluid Replacement  Crystalloid: see anesthesia record Blood: none  FFP: none   Specimens Removed: 1 to pathology   Sponge and Instrument Count Correct? yes   PACU: portable radiograph - knee AP and Lateral   Plan/RTC: Return in 2 weeks for wound check.   Weight  Bearing/Load Lower Extremity: full   Implant Name Type Inv. Item Serial No. Manufacturer Lot No. LRB No. Used Action  CEMENT BONE REFOBACIN R1X40 [place] - [id number] Cement CEMENT BONE REFOBACIN R1X40 [place]  ZIMMER RECON(ORTH,TRAU,BIO,SG) [id number] Left 1 Implanted  CEMENT BONE REFOBACIN R1X40 [place] - [id number] Cement CEMENT BONE REFOBACIN R1X40 [place]  ZIMMER RECON(ORTH,TRAU,BIO,SG) [id number] Left 1 Implanted  STEM POLY PAT PLY 41M KNEE - [id number] Knees STEM POLY PAT PLY 41M KNEE  ZIMMER RECON(ORTH,TRAU,BIO,SG) [id number] Left 1 Implanted  COMP FEM CEMT PERSONA STD SZ7 - [id number] Knees COMP FEM CEMT PERSONA STD SZ7  ZIMMER RECON(ORTH,TRAU,BIO,SG) [id number] Left 1 Implanted  STEM TIBIAL SZ6-7 E-F10 - [id number] Stem STEM TIBIAL SZ6-7 E-F10  ZIMMER RECON(ORTH,TRAU,BIO,SG) [id number] Left 1 Implanted  TIBIA STEM 5 DEG SZ E L KNEE - [id number] Knees TIBIA STEM 5 DEG SZ E L KNEE  ZIMMER RECON(ORTH,TRAU,BIO,SG) [id number] Left 1 Implanted    N. Eduard Roux, MD [facility] 11:28 AM

## 2021-04-01 NOTE — Anesthesia Procedure Notes (Signed)
Spinal  Patient location during procedure: OR Start time: 04/01/2021 9:56 AM End time: 04/01/2021 9:59 AM Reason for block: surgical anesthesia Staffing Performed: anesthesiologist  Anesthesiologist: Brennan Bailey, MD Preanesthetic Checklist Completed: patient identified, IV checked, risks and benefits discussed, surgical consent, monitors and equipment checked, pre-op evaluation and timeout performed Spinal Block Patient position: sitting Prep: DuraPrep and site prepped and draped Patient monitoring: continuous pulse ox, blood pressure and heart rate Approach: midline Location: L3-4 Injection technique: single-shot Needle Needle type: Pencan  Needle gauge: 24 G Needle length: 9 cm Assessment Events: CSF return Additional Notes Risks, benefits, and alternative discussed. Patient gave consent to procedure. Prepped and draped in sitting position. Patient sedated but responsive to voice. Clear CSF obtained after two needle redirections. Positive terminal aspiration. No pain or paraesthesias with injection. Patient tolerated procedure well. Vital signs stable. Tawny Asal, MD

## 2021-04-01 NOTE — Discharge Instructions (Signed)

## 2021-04-01 NOTE — Plan of Care (Signed)

## 2021-04-02 ENCOUNTER — Encounter (HOSPITAL_COMMUNITY): Payer: Self-pay | Admitting: Orthopaedic Surgery

## 2021-04-02 ENCOUNTER — Other Ambulatory Visit (HOSPITAL_COMMUNITY): Payer: Medicaid Other

## 2021-04-02 DIAGNOSIS — Z930 Tracheostomy status: Secondary | ICD-10-CM | POA: Diagnosis not present

## 2021-04-02 DIAGNOSIS — C329 Malignant neoplasm of larynx, unspecified: Secondary | ICD-10-CM | POA: Diagnosis not present

## 2021-04-02 DIAGNOSIS — M1712 Unilateral primary osteoarthritis, left knee: Secondary | ICD-10-CM | POA: Diagnosis not present

## 2021-04-02 DIAGNOSIS — Z96652 Presence of left artificial knee joint: Secondary | ICD-10-CM | POA: Diagnosis not present

## 2021-04-02 DIAGNOSIS — C321 Malignant neoplasm of supraglottis: Secondary | ICD-10-CM | POA: Diagnosis not present

## 2021-04-02 LAB — BASIC METABOLIC PANEL
Anion gap: 8 (ref 5–15)
BUN: 20 mg/dL (ref 8–23)
CO2: 24 mmol/L (ref 22–32)
Calcium: 8.9 mg/dL (ref 8.9–10.3)
Chloride: 102 mmol/L (ref 98–111)
Creatinine, Ser: 0.96 mg/dL (ref 0.44–1.00)
GFR, Estimated: >60 mL/min (ref >60)
Glucose, Bld: 120 mg/dL — ABNORMAL HIGH (ref 70–99)
Potassium: 4.5 mmol/L (ref 3.5–5.1)
Sodium: 134 mmol/L — ABNORMAL LOW (ref 135–145)

## 2021-04-02 LAB — CBC
HCT: 34.7 % — ABNORMAL LOW (ref 36.0–46.0)
Hemoglobin: 11 g/dL — ABNORMAL LOW (ref 12.0–15.0)
MCH: 29.9 pg (ref 26.0–34.0)
MCHC: 31.7 g/dL (ref 30.0–36.0)
MCV: 94.3 fL (ref 80.0–100.0)
Platelets: 326 10*3/uL (ref 150–400)
RBC: 3.68 MIL/uL — ABNORMAL LOW (ref 3.87–5.11)
RDW: 12.4 % (ref 11.5–15.5)
WBC: 8 10*3/uL (ref 4.0–10.5)
nRBC: 0 % (ref 0.0–0.2)

## 2021-04-02 NOTE — Progress Notes (Addendum)
Physical Therapy Treatment Patient Details Name: Deborah [name] MRN: [id number] DOB: 03-12-1956 Today's Date: 04/02/2021   History of Present Illness Pt adm 10/17 for lt TKR. PMH - Rt TKR, breast CA, larynx CA,   Tracheostomy dependent, HTN, hx of breast and larynx cancer, chronic trach    PT Comments    Patient progressing well this morning. Reports no pain. Tolerated bed mobility, transfers and gait training with mod I-supervision. Gait mechanics improved with use of RW so discussed importance of using RW at home for at least 1 week. Pt agreeble. Mild DOE with activity but pt reports this is baseline. Sp02 remained >91% on RA. Tolerated stair training with use of rail, no assist needed. Reviewed HEP which pt reports she has been doing at home already. Knee AROM 2-105 degrees. Discussed precautions and positioning of post surgical knee as well as mobility expectations for home. Pt functioning at Mod I level towards end of session. Safe to d/c home from a mobility stand point.    Recommendations for follow up therapy are one component of a multi-disciplinary discharge planning process, led by the attending physician.  Recommendations may be updated based on patient status, additional functional criteria and insurance authorization.  Follow Up Recommendations  Follow surgeon's recommendation for DC plan and follow-up therapies     Equipment Recommendations  None recommended by PT    Recommendations for Other Services       Precautions / Restrictions Precautions Precautions: None;Knee Precaution Booklet Issued: No Precaution Comments: Reviewed no pillow under knee and precautions Restrictions Weight Bearing Restrictions: Yes RLE Weight Bearing: Weight bearing as tolerated     Mobility  Bed Mobility Overal bed mobility: Modified Independent                  Transfers Overall transfer level: Needs assistance Equipment used: Rolling walker (2  wheeled);None Transfers: Sit to/from Stand Sit to Stand: Modified independent (Device/Increase time)         General transfer comment: Stood from EOB x2 with and without use of RW; no imbalance noted.  Ambulation/Gait Ambulation/Gait assistance: Supervision;Modified independent (Device/Increase time) Gait Distance (Feet): 150 Feet (x2 bouts, + 25') Assistive device: Rolling walker (2 wheeled);None Gait Pattern/deviations: Step-through pattern;Decreased stride length Gait velocity: decr Gait velocity interpretation: 1.31 - 2.62 ft/sec, indicative of limited community ambulator General Gait Details: Steady gait with RW, walked with RW and without; noted to have decreased knee flexion and antalgic like gait wthout RW so agreeable to walking with RW for better gait mechanics.   Stairs Stairs: Yes Stairs assistance: Supervision Stair Management: One rail Left;Step to pattern Number of Stairs: 2 General stair comments: Cues for technique, holding left rail to simulate home   Wheelchair Mobility    Modified Rankin (Stroke Patients Only)       Balance Overall balance assessment: No apparent balance deficits (not formally assessed)                                          Cognition Arousal/Alertness: Awake/alert Behavior During Therapy: WFL for tasks assessed/performed Overall Cognitive Status: Within Functional Limits for tasks assessed                                        Exercises Total Joint Exercises Ankle Circles/Pumps: AROM;Both;Supine;10  reps Quad Sets: AROM;Left;5 reps;Supine Long Arc Quad: AROM;Left;5 reps;Seated Goniometric ROM: 2-105 degrees knee AROM Marching in Standing: AROM;Both;5 reps;Standing    General Comments General comments (skin integrity, edema, etc.): Trach collar removed for session; Sp02 >91% during activity. Mild DOE but pt reports this is baseline.      Pertinent Vitals/Pain Pain Assessment: No/denies  pain    Home Living                      Prior Function            PT Goals (current goals can now be found in the care plan section) Progress towards PT goals: Progressing toward goals    Frequency    7X/week      PT Plan Current plan remains appropriate    Co-evaluation              AM-PAC PT "6 Clicks" Mobility   Outcome Measure  Help needed turning from your back to your side while in a flat bed without using bedrails?: None Help needed moving from lying on your back to sitting on the side of a flat bed without using bedrails?: None Help needed moving to and from a bed to a chair (including a wheelchair)?: None Help needed standing up from a chair using your arms (e.g., wheelchair or bedside chair)?: None Help needed to walk in hospital room?: None Help needed climbing 3-5 steps with a railing? : A Little 6 Click Score: 23    End of Session   Activity Tolerance: Patient tolerated treatment well Patient left: in bed;with call bell/phone within reach Nurse Communication: Mobility status PT Visit Diagnosis: Other abnormalities of gait and mobility (R26.89)     Time: 3662-9476 PT Time Calculation (min) (ACUTE ONLY): 25 min  Charges:  $Gait Training: 8-22 mins $Therapeutic Activity: 8-22 mins                     Marisa Severin, PT, DPT Acute Rehabilitation Services Pager [phone] Office [phone]      Marguarite Arbour A [place] 04/02/2021, 9:02 AM

## 2021-04-02 NOTE — Progress Notes (Signed)
Pt's d/c AVS was taught to pt at bedside. Pt verbalized understanding of teaching at this time. Pt's IV was removed and documented in chart - intact. Patient's clothing, glasses, phone, and equipment was collected prior to being wheeled from the unit by staff.

## 2021-04-02 NOTE — TOC Transition Note (Signed)
Transition of Care [facility]) - CM/SW Discharge Note   Patient Details  Name: Deborah [name] MRN: [id number] Date of Birth: [birth year]  [facility]) CM/SW Contact:  Sharin Mons, RN Phone Number: 04/02/2021, 10:08 AM   Clinical Narrative:    Patient will DC QZ:RAQT Anticipated DC date: 04/02/2021 Family notified: yes Transport by: car       - s/p L TKR, 10/17  Hx of  R TKR, breast CA, larynx CA,   Tracheostomy dependent, HTN, hx of breast and larynx cancer, chronic trach   Pt from home with husband. PTA independent with ADL's. Pt with  rolling walker and BSC  @ home. Per MD patient ready for DC today . RN, patient, and patient's husband aware of DC.  Pt without DME needs. Husband to provide supervision and care one d/c from hospital.  Pt without Rx med concerns. Post hospital f/u noted on AVS.  RNCM will sign off for now as intervention is no longer needed. Please consult [place] if new needs arise.      Final next level of care: Home/Self Care Barriers to Discharge: No Barriers Identified   Patient Goals and CMS Choice        Discharge Placement                       Discharge Plan and Services                                     Social Determinants of Health (SDOH) Interventions     Readmission Risk Interventions No flowsheet data found.

## 2021-04-02 NOTE — Progress Notes (Signed)
Subjective: 1 Day Post-Op Procedure(s) (LRB): LEFT TOTAL KNEE ARTHROPLASTY (Left) Patient reports pain as mild.    Objective: Vital signs in last 24 hours: Temp:  [97.5 F (36.4 C)-98 F (36.7 C)] 97.5 F (36.4 C) (10/18 0539) Pulse Rate:  [60-77] 77 (10/18 0539) Resp:  [15-18] 18 (10/18 0539) BP: (117-186)/(58-106) 117/70 (10/18 0539) SpO2:  [78 %-100 %] 98 % (10/18 0539) FiO2 (%):  [28 %] 28 % (10/17 2243)  Intake/Output from previous day: 10/17 0701 - 10/18 0700 In: 1879.4 [I.V.:1879.4] Out: 4395 [Urine:4375; Blood:20] Intake/Output this shift: No intake/output data recorded.  Recent Labs    04/02/21 0243  HGB 11.0*   Recent Labs    04/02/21 0243  WBC 8.0  RBC 3.68*  HCT 34.7*  PLT 326   Recent Labs    04/02/21 0243  NA 134*  K 4.5  CL 102  CO2 24  BUN 20  CREATININE 0.96  GLUCOSE 120*  CALCIUM 8.9   No results for input(s): LABPT, INR in the last 72 hours.  Neurologically intact Neurovascular intact Sensation intact distally Intact pulses distally Dorsiflexion/Plantar flexion intact Incision: scant drainage No cellulitis present Compartment soft   Assessment/Plan: 1 Day Post-Op Procedure(s) (LRB): LEFT TOTAL KNEE ARTHROPLASTY (Left) Advance diet Up with therapy D/C IV fluids Discharge home with home health after second PT session WBAT LLE ABLA- mild and stable   Anticipated LOS equal to or greater than 2 midnights due to - Age 63 and older with one or more of the following:  - Obesity  - Expected need for hospital services (PT, OT, Nursing) required for safe  discharge  - Anticipated need for postoperative skilled nursing care or inpatient rehab  - Active co-morbidities: None OR   - Unanticipated findings during/Post Surgery: None  - Patient is a high risk of re-admission due to: None   Aundra Dubin 04/02/2021, 8:07 AM

## 2021-04-02 NOTE — Progress Notes (Signed)
Patient wants the foley removed later in the morning, will let the day shift nurse know.

## 2021-04-02 NOTE — Plan of Care (Signed)

## 2021-04-02 NOTE — Progress Notes (Signed)
Occupational Therapy Discharge Patient Details Name: Lenox Ladouceur MRN: [id number] DOB: 09-05-1955 Today's Date: 04/02/2021 Time:  -     Patient discharged from OT services secondary to  OT screen by PT [facility] .  Please see latest therapy progress note for current level of functioning and progress toward goals.    Progress and discharge plan discussed with patient and/or caregiver: Patient/Caregiver agrees with plan  GO     Billey Chang, OTR/L  Acute Rehabilitation Services Pager: [phone] Office: [phone] . 04/02/2021, 7:26 AM

## 2021-04-02 NOTE — Discharge Summary (Signed)
Patient ID: Deborah [name] MRN: [id number] DOB/AGE: Oct 04, 1955 65 y.o.  Admit date: 04/01/2021 Discharge date: 04/02/2021  Admission Diagnoses:  Principal Problem:   Primary osteoarthritis of left knee Active Problems:   Status post total left knee replacement   Discharge Diagnoses:  Same  Past Medical History:  Diagnosis Date   Anemia    h/o of   Anxiety    Arthritis    in knees   Asthma    Breast cancer ([place])    Bronchitis    COPD (chronic obstructive pulmonary disease) ([place])    Diverticulosis 11/15/2012   noted on screening colonoscopy    Dyspnea    with COPD exerbation   Esophageal stricture    Former smoker 03/19/2011   GERD (gastroesophageal reflux disease)    Heart murmur    asymptomatic    History of laryngectomy    History of radiation therapy 11/15/18- 12/06/18   Right Breast total dose 42.56 Gy in 16 fractions.    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   Leukocytopenia    Nausea alone 07/28/2013   Neck pain 01/21/2012   Normal MRI 07/14/2011   negative for mestasis    Pneumonia 2012   Sciatica    Seizures ([place])    07/24/11 off Effexor w/o seizure   Sepsis ([place]) 08/04/2012   Sinusitis, chronic 07/20/2011   Bilateral maxillary, identified on MRI of head 07/14/11.     Tracheostomy dependent [facility])     Surgeries: Procedure(s): LEFT TOTAL KNEE ARTHROPLASTY on 04/01/2021   Consultants:   Discharged Condition: Improved  Hospital Course: Deborah [name] is an 65 y.o. female who was admitted 04/01/2021 for operative treatment ofPrimary osteoarthritis of left knee. Patient has severe unremitting pain that affects sleep, daily activities, and work/hobbies. After pre-op clearance the patient was taken  to the operating room on 04/01/2021 and underwent  Procedure(s): LEFT TOTAL KNEE ARTHROPLASTY.    Patient was given perioperative antibiotics:  Anti-infectives (From admission, onward)    Start     Dose/Rate Route Frequency Ordered Stop   04/01/21 1600  ceFAZolin (ANCEF) IVPB 2g/100 mL premix        2 g 200 mL/hr over 30 Minutes Intravenous Every 6 hours 04/01/21 1509 04/01/21 2244   04/01/21 1030  vancomycin (VANCOCIN) powder  Status:  Discontinued          As needed 04/01/21 1030 04/01/21 1205   04/01/21 0809  ceFAZolin (ANCEF) 2-4 GM/100ML-% IVPB       Note to Pharmacy: Alvy Beal   : cabinet override      04/01/21 0809 04/01/21 1029   04/01/21 0800  ceFAZolin (ANCEF) IVPB 2g/100 mL premix        2 g 200 mL/hr over 30 Minutes Intravenous On call to O.R. 04/01/21 0758 04/01/21 1017   04/01/21 0800  vancomycin (VANCOCIN) IVPB 1000 mg/200 mL premix        1,000 mg 200 mL/hr over 60 Minutes Intravenous  Once 04/01/21 0759 04/01/21 0936        Patient was given sequential compression devices, early ambulation, and chemoprophylaxis to prevent DVT.  Patient benefited maximally from hospital stay and there were no complications.    Recent vital signs: Patient Vitals for  the past 24 hrs:  BP Temp Temp src Pulse Resp SpO2  04/02/21 0539 117/70 (!) 97.5 F (36.4 C) Oral 77 18 98 %  04/01/21 2209 (!) 150/72 97.8 F (36.6 C) Oral 73 18 98 %  04/01/21 1510 (!) 152/76 98 F (36.7 C) Oral 71 18 95 %  04/01/21 1425 (!) 158/73 -- -- 65 18 98 %  04/01/21 1355 (!) 141/71 -- -- 65 16 99 %  04/01/21 1325 (!) 146/77 -- -- 63 16 100 %  04/01/21 1310 138/79 -- -- 63 16 93 %  04/01/21 1255 (!) 152/81 (!) 97.5 F (36.4 C) -- 64 15 99 %  04/01/21 1249 -- -- -- 65 15 93 %  04/01/21 1240 139/76 -- -- 67 16 95 %  04/01/21 1230 127/68 -- -- 61 15 100 %  04/01/21 1225 127/68 -- -- 63 17 100 %  04/01/21 1215 (!) 125/58 (!) 97.5 F (36.4 C) -- 64 16 100 %  04/01/21 0930 (!) 153/75 -- -- 61 18  90 %  04/01/21 0925 135/70 -- -- 62 16 96 %  04/01/21 0920 (!) 151/75 -- -- 60 15 95 %  04/01/21 0915 (!) 164/75 -- -- 63 17 96 %  04/01/21 0910 (!) 186/106 -- -- 67 17 (!) 78 %     Recent laboratory studies:  Recent Labs    04/02/21 0243  WBC 8.0  HGB 11.0*  HCT 34.7*  PLT 326  NA 134*  K 4.5  CL 102  CO2 24  BUN 20  CREATININE 0.96  GLUCOSE 120*  CALCIUM 8.9     Discharge Medications:   Allergies as of 04/02/2021       Reactions   Lisinopril Cough        Medication List     STOP taking these medications    benzonatate 100 MG capsule Commonly known as: TESSALON       TAKE these medications    albuterol (5 MG/ML) 0.5% nebulizer solution Commonly known as: PROVENTIL Take 0.5 mLs (2.5 mg total) by nebulization every 6 (six) hours as needed for wheezing or shortness of breath. Please give the patient 10 31ml vials.   albuterol 108 (90 Base) MCG/ACT inhaler Commonly known as: ProAir HFA Inhale 2 puffs into the lungs every 4 (four) hours as needed for wheezing or shortness of breath.   amLODipine 10 MG tablet Commonly known as: NORVASC Take 1 tablet by mouth daily   anastrozole 1 MG tablet Commonly known as: ARIMIDEX Take 1 tablet (1 mg total) by mouth daily.   aspirin EC 81 MG tablet Take 1 tablet (81 mg total) by mouth 2 (two) times daily. To be taken after surgery   cetirizine 10 MG tablet Commonly known as: ZYRTEC Take 1 tablet (10 mg total) by mouth daily.   docusate sodium 100 MG capsule Commonly known as: Colace Take 1 capsule (100 mg total) by mouth daily as needed.   Dulera 200-5 MCG/ACT Aero Generic drug: mometasone-formoterol Inhale 2 puffs into the lungs 2 (two) times daily.   escitalopram 10 MG tablet Commonly known as: Lexapro Take 1 tablet (10 mg total) by mouth daily.   Humidifiers Misc 1 application by Tracheal Tube route as needed.   ipratropium-albuterol 0.5-2.5 (3) MG/3ML Soln Commonly known as: DUONEB Take 3 mLs  by nebulization every 4 (four) hours as needed.   levothyroxine 200 MCG tablet Commonly known as: Synthroid Take 1 tablet (200 mcg total) by mouth daily before breakfast.  losartan-hydrochlorothiazide 100-12.5 MG tablet Commonly known as: Hyzaar Take 1 tablet by mouth daily.   methocarbamol 500 MG tablet Commonly known as: Robaxin Take 1 tablet (500 mg total) by mouth 2 (two) times daily as needed. To be taken after surgery   metoprolol tartrate 50 MG tablet Commonly known as: LOPRESSOR Take 1 tablet (50 mg total) by mouth 2 (two) times daily.   nystatin cream Commonly known as: MYCOSTATIN APPLY  CREAM TOPICALLY TWICE DAILY AS NEEDED What changed: See the new instructions.   omeprazole 40 MG capsule Commonly known as: PRILOSEC Take 1 capsule (40 mg total) by mouth 2 (two) times daily.   ondansetron 4 MG tablet Commonly known as: Zofran Take 1 tablet (4 mg total) by mouth every 8 (eight) hours as needed for nausea or vomiting.   oxyCODONE-acetaminophen 5-325 MG tablet Commonly known as: Percocet Take 1-2 tablets by mouth every 6 (six) hours as needed. To be taken after surgery               Durable Medical Equipment  (From admission, onward)           Start     Ordered   04/01/21 1510  DME Walker rolling  Once       Question Answer Comment  Walker: With [place]   Patient needs a walker to treat with the following condition Status post left partial knee replacement      04/01/21 1509   04/01/21 1510  DME 3 n 1  Once        04/01/21 1509   04/01/21 1510  DME Bedside commode  Once       Question:  Patient needs a bedside commode to treat with the following condition  Answer:  Status post left partial knee replacement   04/01/21 1509            Diagnostic Studies: DG Knee Left Port  Result Date: 04/01/2021 CLINICAL DATA:  Status post left knee surgery EXAM: PORTABLE LEFT KNEE - 1-2 VIEW COMPARISON:  12/11/2020 FINDINGS: Status post left knee  total arthroplasty with expected overlying postoperative change. No evidence of perihardware fracture or component malpositioning. IMPRESSION: Status post left knee total arthroplasty with expected overlying postoperative change. No evidence of perihardware fracture or component malpositioning. Electronically Signed   By: Delanna Ahmadi M.D.   On: 04/01/2021 13:18    Disposition: Discharge disposition: 01-Home or Self Care          Follow-up Information     Leandrew Koyanagi, MD. Schedule an appointment as soon as possible for a visit in 2 week(s).   Specialty: Orthopedic Surgery Contact information: [address] [place] [address] [phone]                  Signed: Aundra Dubin 04/02/2021, 8:08 AM

## 2021-04-02 NOTE — Plan of Care (Signed)
  Problem: Education: Goal: Knowledge of General Education information will improve Description: Including pain rating scale, medication(s)/side effects and non-pharmacologic comfort measures 04/02/2021 1246 by Damaris Schooner, RN Outcome: Adequate for Discharge 04/02/2021 1129 by Damaris Schooner, RN Outcome: Progressing   Problem: Health Behavior/Discharge Planning: Goal: Ability to manage health-related needs will improve 04/02/2021 1246 by Damaris Schooner, RN Outcome: Adequate for Discharge 04/02/2021 1129 by Damaris Schooner, RN Outcome: Progressing   Problem: Clinical Measurements: Goal: Ability to maintain clinical measurements within normal limits will improve 04/02/2021 1246 by Damaris Schooner, RN Outcome: Adequate for Discharge 04/02/2021 1129 by Damaris Schooner, RN Outcome: Progressing Goal: Will remain free from infection 04/02/2021 1246 by Damaris Schooner, RN Outcome: Adequate for Discharge 04/02/2021 1129 by Damaris Schooner, RN Outcome: Progressing Goal: Diagnostic test results will improve 04/02/2021 1246 by Damaris Schooner, RN Outcome: Adequate for Discharge 04/02/2021 1129 by Damaris Schooner, RN Outcome: Progressing Goal: Respiratory complications will improve 04/02/2021 1246 by Damaris Schooner, RN Outcome: Adequate for Discharge 04/02/2021 1129 by Damaris Schooner, RN Outcome: Progressing Goal: Cardiovascular complication will be avoided 04/02/2021 1246 by Damaris Schooner, RN Outcome: Adequate for Discharge 04/02/2021 1129 by Damaris Schooner, RN Outcome: Progressing   Problem: Activity: Goal: Risk for activity intolerance will decrease 04/02/2021 1246 by Damaris Schooner, RN Outcome: Adequate for Discharge 04/02/2021 1129 by Damaris Schooner, RN Outcome: Progressing   Problem: Nutrition: Goal: Adequate nutrition will be maintained 04/02/2021 1246 by Damaris Schooner, RN Outcome: Adequate for Discharge 04/02/2021 1129 by Damaris Schooner, RN Outcome:  Progressing   Problem: Coping: Goal: Level of anxiety will decrease 04/02/2021 1246 by Damaris Schooner, RN Outcome: Adequate for Discharge 04/02/2021 1129 by Damaris Schooner, RN Outcome: Progressing   Problem: Elimination: Goal: Will not experience complications related to bowel motility 04/02/2021 1246 by Damaris Schooner, RN Outcome: Adequate for Discharge 04/02/2021 1129 by Damaris Schooner, RN Outcome: Progressing Goal: Will not experience complications related to urinary retention 04/02/2021 1246 by Damaris Schooner, RN Outcome: Adequate for Discharge 04/02/2021 1129 by Damaris Schooner, RN Outcome: Progressing   Problem: Pain Managment: Goal: General experience of comfort will improve 04/02/2021 1246 by Damaris Schooner, RN Outcome: Adequate for Discharge 04/02/2021 1129 by Damaris Schooner, RN Outcome: Progressing   Problem: Safety: Goal: Ability to remain free from injury will improve 04/02/2021 1246 by Damaris Schooner, RN Outcome: Adequate for Discharge 04/02/2021 1129 by Damaris Schooner, RN Outcome: Progressing   Problem: Skin Integrity: Goal: Risk for impaired skin integrity will decrease 04/02/2021 1246 by Damaris Schooner, RN Outcome: Adequate for Discharge 04/02/2021 1129 by Damaris Schooner, RN Outcome: Progressing

## 2021-04-03 ENCOUNTER — Telehealth: Payer: Self-pay

## 2021-04-03 NOTE — Telephone Encounter (Signed)
Transition Care Management Unsuccessful Follow-up Telephone Call  Date of discharge and from where:  04/02/2021-[facility]   Attempts:  1st Attempt  Reason for unsuccessful TCM follow-up call:  Left voice message

## 2021-04-04 NOTE — Telephone Encounter (Signed)
Transition Care Management Unsuccessful Follow-up Telephone Call  Date of discharge and from where:  04/02/2021 from [facility]  Attempts:  2nd Attempt  Reason for unsuccessful TCM follow-up call:  Left voice message

## 2021-04-05 NOTE — Telephone Encounter (Signed)
Transition Care Management Unsuccessful Follow-up Telephone Call  Date of discharge and from where:  04/02/2021 from [facility]  Attempts:  3rd Attempt  Reason for unsuccessful TCM follow-up call:  Unable to reach patient

## 2021-04-05 NOTE — Telephone Encounter (Signed)
Transition Care Management Follow-up Telephone Call Date of discharge and from where: 04/02/2021 from [facility] How have you been since you were released from the hospital? Pt stated that she is feeling well and did not have any questions or concerns.  Any questions or concerns? No  Items Reviewed: Did the pt receive and understand the discharge instructions provided? Yes  Medications obtained and verified? Yes  Other? No  Any new allergies since your discharge? No  Dietary orders reviewed? No Do you have support at home? Yes   Functional Questionnaire: (I = Independent and D = Dependent) ADLs: I  Bathing/Dressing- I  Meal Prep- I  Eating- I  Maintaining continence- I  Transferring/Ambulation- I  Managing Meds- I   Follow up appointments reviewed:  PCP Hospital f/u appt confirmed? Yes  Scheduled to see Deborah Button, MD on 04/16/2021 @ 2:30pm. [facility] f/u appt confirmed? Yes  Scheduled to see Deborah Shown, MD on 04/17/2021 @ 10:15am. Are transportation arrangements needed? No  If their condition worsens, is the pt aware to call PCP or go to the Emergency Dept.? Yes Was the patient provided with contact information for the PCP's office or ED? Yes Was to pt encouraged to call back with questions or concerns? Yes

## 2021-04-10 ENCOUNTER — Other Ambulatory Visit: Payer: Self-pay | Admitting: Physician Assistant

## 2021-04-10 ENCOUNTER — Telehealth: Payer: Self-pay | Admitting: Orthopaedic Surgery

## 2021-04-10 NOTE — Telephone Encounter (Signed)
[place] forms received. To [employer].

## 2021-04-10 NOTE — Telephone Encounter (Signed)
Pt submitted medical release form, short term disability forms, and $25.00 cash payment. Accepted 04/10/2021

## 2021-04-16 ENCOUNTER — Ambulatory Visit (INDEPENDENT_AMBULATORY_CARE_PROVIDER_SITE_OTHER): Payer: Medicaid Other

## 2021-04-16 ENCOUNTER — Ambulatory Visit: Payer: Medicaid Other | Admitting: Family Medicine

## 2021-04-16 ENCOUNTER — Other Ambulatory Visit: Payer: Self-pay

## 2021-04-16 VITALS — BP 106/75 | HR 106 | Ht 67.0 in | Wt 231.0 lb

## 2021-04-16 DIAGNOSIS — Z23 Encounter for immunization: Secondary | ICD-10-CM

## 2021-04-16 DIAGNOSIS — E039 Hypothyroidism, unspecified: Secondary | ICD-10-CM | POA: Diagnosis not present

## 2021-04-16 DIAGNOSIS — J449 Chronic obstructive pulmonary disease, unspecified: Secondary | ICD-10-CM | POA: Diagnosis not present

## 2021-04-16 DIAGNOSIS — I10 Essential (primary) hypertension: Secondary | ICD-10-CM

## 2021-04-16 MED ORDER — SPIRIVA RESPIMAT 2.5 MCG/ACT IN AERS: 2.0000 | INHALATION_SPRAY | Freq: Every day | RESPIRATORY_TRACT | 2 refills | Status: DC

## 2021-04-16 NOTE — Patient Instructions (Addendum)
It was nice seeing you today!  Start using the Spiriva inhaler twice a day in addition to your [place].  Follow-up in 3 months.  Please arrive at least 15 minutes prior to your scheduled appointments.  Stay well, Zola Button, MD [place] [phone]

## 2021-04-16 NOTE — Progress Notes (Addendum)
    SUBJECTIVE:   CHIEF COMPLAINT / HPI: f/u hypothyroidism  Hypothyroidism Most recent TSH elevated 56 in June. Levothyroxine increased to 200 mcg from 175 mcg.  HTN Takes metoprolol tartrate 50 mg BID, losartan-HCTZ 100-12.5 mg, amlodipine 10 mg Reports good adherence with her medications and did take her BP meds today Woke up with a HA today, still present Home readings 130s/80s  Dyspnea Patient reports increasing shortness of breath over the past 1 month.  She was also concerned that while she was in the hospital for her left knee arthroplasty, she did require supplemental oxygen as her O2 dropped as low as 88%. Denies any shortness of breath currently. She has been taking her Dulera twice daily as instructed and reports she has been having to use her albuterol nebulizer more frequently, about once per day. She was previously seeing pulmonology but has not been in a long time.  Recent left knee arthroplasty, has orthopedic follow-up tomorrow  PERTINENT  PMH / PSH: HTN, COPD, larynx cancer, GERD, hypothyroidism, depression  OBJECTIVE:   BP 106/75   Pulse (!) 106   Ht 5\' 7"  (1.702 m)   Wt 231 lb (104.8 kg)   SpO2 96%   BMI 36.18 kg/m   General: Alert, NAD, tracheostomy in place CV: RRR, no murmurs Pulm: CTAB, decreased breath sounds, no wheezes or rales Neuro: Alert, interactive, CN II-XII intact, full strength in all extremities  ASSESSMENT/PLAN:   Essential hypertension Significantly elevated initially but improved on repeat.  No change to medications.  Hypothyroidism Levothyroxine dose was increased 4 months ago.  We will repeat TSH today.  COPD (chronic obstructive pulmonary disease) (HCC) Increasing dyspnea over the past month and increasing nebulizer use.  Interestingly, she is only on LAMA/ICS and as needed [place].  Trelegy is not on formulary so we will add on LAMA.   HCM - Covid booster given today  Zola Button, MD [place]

## 2021-04-16 NOTE — Assessment & Plan Note (Addendum)
Increasing dyspnea over the past month and increasing nebulizer use.  Not in acute exacerbation.  Interestingly, she is only on LAMA/ICS and as needed [place].  Trelegy is not on formulary so we will add on LAMA.

## 2021-04-16 NOTE — Assessment & Plan Note (Signed)
Significantly elevated initially but improved on repeat.  No change to medications.

## 2021-04-16 NOTE — Assessment & Plan Note (Signed)
Levothyroxine dose was increased 4 months ago.  We will repeat TSH today.

## 2021-04-17 ENCOUNTER — Encounter: Payer: Self-pay | Admitting: Orthopaedic Surgery

## 2021-04-17 ENCOUNTER — Ambulatory Visit (INDEPENDENT_AMBULATORY_CARE_PROVIDER_SITE_OTHER): Payer: Medicaid Other | Admitting: Orthopaedic Surgery

## 2021-04-17 DIAGNOSIS — Z96652 Presence of left artificial knee joint: Secondary | ICD-10-CM

## 2021-04-17 LAB — TSH RFX ON ABNORMAL TO FREE T4: TSH: 0.936 u[IU]/mL (ref 0.450–4.500)

## 2021-04-17 MED ORDER — OXYCODONE-ACETAMINOPHEN 5-325 MG PO TABS: 1.0000 | ORAL_TABLET | Freq: Two times a day (BID) | ORAL | 0 refills | Status: DC | PRN

## 2021-04-17 MED ORDER — CELECOXIB 200 MG PO CAPS: 200.0000 mg | ORAL_CAPSULE | Freq: Two times a day (BID) | ORAL | 3 refills | Status: DC

## 2021-04-17 NOTE — Progress Notes (Signed)
Post-Op Visit Note   Patient: Deborah [name]           Date of Birth: 1956-03-17           MRN: [id number] Visit Date: 04/17/2021 PCP: Zola Button, MD   Assessment & Plan:  Chief Complaint:  Chief Complaint  Patient presents with   Left Knee - Pain, Routine Post Op   Visit Diagnoses:  1. Status post total left knee replacement     Plan: Nafisa is a 2-week status post left total knee replacement.  Overall doing okay.  She does report constant pain and inflammation swelling with numbness and tingling around the surgical site.  Requesting a refill on oxycodone.  Doing home exercises currently.  Her insurance denied home health PT.  Left knee shows a healed surgical incision.  Sutures intact.  No signs of infection or drainage.  No calf tenderness.  Range of motion is 10 to 95 degrees.  Stable to varus valgus.   Sutures were removed today.  We will send a referral to outpatient physical therapy at this time.  Percocet refilled and Celebrex was prescribed.  She will use ice man at all times.  Continue with the TED hose for another couple weeks.  Recheck in 4 weeks with two-view x-rays of the left knee.   Follow-Up Instructions: Return in about 4 weeks (around 05/15/2021).   Orders:  Orders Placed This Encounter  Procedures   Ambulatory referral to Physical Therapy   Meds ordered this encounter  Medications   celecoxib (CELEBREX) 200 MG capsule    Sig: Take 1 capsule (200 mg total) by mouth 2 (two) times daily.    Dispense:  30 capsule    Refill:  3   oxyCODONE-acetaminophen (PERCOCET) 5-325 MG tablet    Sig: Take 1-2 tablets by mouth 2 (two) times daily as needed for severe pain.    Dispense:  30 tablet    Refill:  0    Imaging: No results found.  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   Major depressive disorder 04/08/2018   Carpal tunnel syndrome 03/16/2018   Obesity 08/24/2017   Plantar fasciitis 06/06/2016   Low back pain 05/21/2016   Osteoarthritis of knees, bilateral 05/31/2015   Knee pain 01/04/2015   Dysphagia 08/16/2014   Weight gain 08/05/2014   DOE (dyspnea on exertion) 04/28/2014   Frequent falls 12/23/2013   Anemia in chronic illness 08/11/2012   Status post trachelectomy 08/04/2012   Chronic pain 01/20/2012   History of head and neck cancer 09/26/2011   Hx of radiation therapy    Tracheitis 07/17/2011   Seizure ([place]) 07/17/2011   Hypothyroidism 03/19/2011   Essential hypertension 03/19/2011   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   Leukocytopenia    Nausea alone 07/28/2013   Neck pain 01/21/2012   Normal MRI 07/14/2011   negative for mestasis    Pneumonia 2012   Sciatica    Seizures ([place])    07/24/11 off Effexor w/o seizure   Sepsis ([place]) 08/04/2012   Sinusitis, chronic 07/20/2011   Bilateral maxillary, identified on MRI of head 07/14/11.     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[place])     Family History  Problem Relation Age of Onset   Heart disease Mother    Heart disease Father    Heart disease Sister    Cancer Brother        type unknown   Liver disease Maternal Grandmother    Cancer Sister        type unknown   Diabetes Sister    Breast cancer Neg Hx     Past Surgical History:  Procedure Laterality Date   BREAST LUMPECTOMY Right 07/06/2018   BREAST LUMPECTOMY WITH RADIOACTIVE SEED LOCALIZATION Right 07/06/2018   Procedure: RIGHT BREAST LUMPECTOMY WITH RADIOACTIVE SEED LOCALIZATION;  Surgeon: Rolm Bookbinder, MD;  Location: [facility];  Service: General;  Laterality: Right;   COLONOSCOPY N/A 11/15/2012   Procedure: COLONOSCOPY;  Surgeon: Lafayette Dragon, MD;  [facility];  Service: Endoscopy;  Laterality: N/A;   DENTAL RESTORATION/EXTRACTION WITH X-RAY     ESOPHAGEAL DILATION N/A 09/09/2019   Procedure: ESOPHAGEAL DILATION;  Surgeon: [name], MD;  Location: [facility];  Service: ENT;  Laterality: N/A;   ESOPHAGEAL DILATION N/A 10/05/2019   Procedure: ESOPHAGEAL DILATION;  Surgeon: [name], MD;  Location: [facility];  Service: ENT;  Laterality: N/A;  via tracheostomy   ESOPHAGEAL DILATION N/A 02/27/2020   Procedure: ESOPHAGEAL DILATION;  Surgeon: [name], MD;  Location: [facility];  Service: ENT;  Laterality: N/A;   ESOPHAGOGASTRODUODENOSCOPY (EGD) WITH PROPOFOL N/A 08/12/2018   Procedure: ESOPHAGOGASTRODUODENOSCOPY (EGD) WITH PROPOFOL;  Surgeon: Doran Stabler, MD;  Location: [facility];  Service:  Gastroenterology;  Laterality: N/A;   ESOPHAGOSCOPY  06/21/2012   Procedure: ESOPHAGOSCOPY;  Surgeon: [name], MD;  Location: [facility];  Service: ENT;  Laterality: N/A;   ESOPHAGOSCOPY WITH DILITATION N/A 09/21/2014   Procedure: ESOPHAGOSCOPY WITH DILITATION;  Surgeon: [name], MD;  Location: [facility];  Service: ENT;  Laterality: N/A;   ESOPHAGOSCOPY WITH DILITATION N/A 07/04/2016   Procedure: ESOPHAGOSCOPY WITH DILITATION;  Surgeon: [name], MD;  Location: [facility];  Service: ENT;  Laterality: N/A;   ESOPHAGOSCOPY WITH DILITATION N/A 12/01/2017   Procedure: ESOPHAGOSCOPY WITH DILITATION;  Surgeon: [name], MD;  Location: [facility];  Service:  ENT;  Laterality: N/A;   ESOPHAGOSCOPY WITH DILITATION N/A 04/19/2018   Procedure: ESOPHAGOSCOPY WITH DILITATION;  Surgeon: [name], MD;  Location: [facility];  Service: ENT;  Laterality: N/A;   ESOPHAGOSCOPY WITH DILITATION N/A 03/07/2019   Procedure: Esophagoscopy with dilatation;  Surgeon: [name], MD;  Location: [facility];  Service: ENT;  Laterality: N/A;   ESOPHAGOSCOPY WITH DILITATION N/A 07/16/2020   Procedure: ESOPHAGOSCOPY WITH DILATION;  Surgeon: [name], MD;  Location: [facility];  Service: ENT;  Laterality: N/A;   ESOPHAGOSCOPY WITH DILITATION N/A 10/08/2020   Procedure: ESOPHAGOSCOPY With Dilation;  Surgeon: [name], MD;  Location: [facility];  Service: ENT;  Laterality: N/A;  [address] [place] to [place]  01/08/2018       FOREIGN BODY REMOVAL BRONCHIAL  10/02/2011   Procedure: REMOVAL FOREIGN BODY BRONCHIAL;  Surgeon: Ruby Cola, MD;  Location: [facility];  Service: ENT;  Laterality: N/A;   FOREIGN BODY REMOVAL BRONCHIAL N/A 01/08/2018   Procedure: REMOVAL FOREIGN BODY BRONCHIAL;  Surgeon: Jodi Marble, MD;  [facility];  Service: ENT;  Laterality: N/A;   LARYNGECTOMY     Porta cath removal     PORTACATH PLACEMENT  09/17/10   Tip in cavoatrial  junction   RE-EXCISION OF BREAST CANCER,SUPERIOR MARGINS Right 07/29/2018   Procedure: RE-EXCISION OF RIGHT BREAST MEDIAL MARGINS;  Surgeon: Rolm Bookbinder, MD;  Location: [facility];  Service: General;  Laterality: Right;   RIGID ESOPHAGOSCOPY N/A 03/19/2019   Procedure: FLEXIBLE ESOPHAGOSCOPY;  Surgeon: Jodi Marble, MD;  Location: [facility];  Service: ENT;  Laterality: N/A;   STOMAPLASTY N/A 10/21/2016   Procedure: Zola Button;  Surgeon: [name], MD;  Location: [facility];  Service: ENT;  Laterality: N/A;   TOTAL KNEE ARTHROPLASTY Right 12/12/2019   Procedure: RIGHT TOTAL KNEE ARTHROPLASTY;  Surgeon: Leandrew Koyanagi, MD;  Location: [facility];  Service: Orthopedics;  Laterality: Right;   TOTAL KNEE ARTHROPLASTY Left 04/01/2021   Procedure: LEFT TOTAL KNEE ARTHROPLASTY;  Surgeon: Leandrew Koyanagi, MD;  Location: [facility];  Service: Orthopedics;  Laterality: Left;   TRACHEAL DILITATION  07/16/2011   Procedure: TRACHEAL DILITATION;  Surgeon: Beckie Salts, MD;  Location: [facility];  Service: ENT;  Laterality: N/A;  dilation of tracheal stoma and replacement of stoma tube   TUBAL LIGATION  1982   Social History   Occupational History   Occupation: part time    Comment: warehouse work  Tobacco Use   Smoking status: Former    Packs/day: 2.00    Years: 20.00    Pack years: 40.00    Types: Cigarettes    Quit date: 09/17/2010    Years since quitting: 10.5   Smokeless tobacco: Never  Vaping Use   Vaping Use: Never used  Substance and Sexual Activity   Alcohol use: Yes    Comment: Socially drinks    Drug use: No    Comment: tried cocaine 1 time 2010 only used 1 time   Sexual activity: Yes    Birth control/protection: Surgical

## 2021-04-18 ENCOUNTER — Encounter: Payer: Self-pay | Admitting: Family Medicine

## 2021-04-19 ENCOUNTER — Encounter: Payer: Medicaid Other | Admitting: Orthopaedic Surgery

## 2021-04-22 ENCOUNTER — Telehealth: Payer: Self-pay | Admitting: Orthopaedic Surgery

## 2021-04-22 ENCOUNTER — Other Ambulatory Visit: Payer: Self-pay | Admitting: Physician Assistant

## 2021-04-22 MED ORDER — HYDROCODONE-ACETAMINOPHEN 5-325 MG PO TABS: 1.0000 | ORAL_TABLET | Freq: Two times a day (BID) | ORAL | 0 refills | Status: DC | PRN

## 2021-04-22 NOTE — Telephone Encounter (Signed)
Pt called stating her pain rx is on back order at her [facility]; she would like to know if there's anything else she can have called in? Pt would still like it sent to her [facility] and would like a CB when it's been done.   [phone]

## 2021-04-22 NOTE — Telephone Encounter (Signed)
Patient called. Says the pharmacy said that Josem Kaufmann is needed for her medication.

## 2021-04-22 NOTE — Telephone Encounter (Signed)
Sent in [place]co

## 2021-04-22 NOTE — Telephone Encounter (Signed)
Patient aware.

## 2021-04-23 DIAGNOSIS — C321 Malignant neoplasm of supraglottis: Secondary | ICD-10-CM | POA: Diagnosis not present

## 2021-04-23 DIAGNOSIS — Z930 Tracheostomy status: Secondary | ICD-10-CM | POA: Diagnosis not present

## 2021-04-23 DIAGNOSIS — C329 Malignant neoplasm of larynx, unspecified: Secondary | ICD-10-CM | POA: Diagnosis not present

## 2021-04-23 NOTE — Telephone Encounter (Signed)
[name] Key: [id number] - PA Case ID: [id number] - Rx #: [id number]   PA Done. Pending Approval.

## 2021-04-23 NOTE — Telephone Encounter (Signed)
[facility] has not yet replied to your PA request. You may close this dialog, return to your dashboard, and perform other tasks.  To check for an update later, open this request again from your dashboard.  If [facility] has not replied to your request within 24 hours please contact [facility] [place] at [phone].

## 2021-04-24 NOTE — Telephone Encounter (Signed)
[name] (Key: [id number]) - [id number] HYDROcodone-Acetaminophen 5-325MG  tablets     Status: PA Response - Approved     Patient aware RX approved

## 2021-04-29 DIAGNOSIS — Z8521 Personal history of malignant neoplasm of larynx: Secondary | ICD-10-CM | POA: Diagnosis not present

## 2021-04-29 DIAGNOSIS — J398 Other specified diseases of upper respiratory tract: Secondary | ICD-10-CM | POA: Diagnosis not present

## 2021-04-29 DIAGNOSIS — K222 Esophageal obstruction: Secondary | ICD-10-CM | POA: Diagnosis not present

## 2021-04-29 DIAGNOSIS — Z923 Personal history of irradiation: Secondary | ICD-10-CM | POA: Diagnosis not present

## 2021-04-30 ENCOUNTER — Other Ambulatory Visit: Payer: Self-pay | Admitting: Otolaryngology

## 2021-04-30 ENCOUNTER — Ambulatory Visit
Admission: RE | Admit: 2021-04-30 | Discharge: 2021-04-30 | Disposition: A | Discharge status: Home / Self Care | Payer: Medicaid Other | Source: Ambulatory Visit | Attending: Otolaryngology | Admitting: Otolaryngology

## 2021-04-30 DIAGNOSIS — R0609 Other forms of dyspnea: Secondary | ICD-10-CM | POA: Diagnosis not present

## 2021-04-30 DIAGNOSIS — Z8521 Personal history of malignant neoplasm of larynx: Secondary | ICD-10-CM

## 2021-05-02 ENCOUNTER — Ambulatory Visit
Admission: RE | Admit: 2021-05-02 | Discharge: 2021-05-02 | Disposition: A | Discharge status: Home / Self Care | Payer: Medicaid Other | Source: Ambulatory Visit | Attending: Otolaryngology | Admitting: Otolaryngology

## 2021-05-02 ENCOUNTER — Other Ambulatory Visit: Payer: Self-pay | Admitting: Otolaryngology

## 2021-05-02 DIAGNOSIS — Z8521 Personal history of malignant neoplasm of larynx: Secondary | ICD-10-CM

## 2021-05-02 DIAGNOSIS — T17900A Unspecified foreign body in respiratory tract, part unspecified causing asphyxiation, initial encounter: Secondary | ICD-10-CM | POA: Diagnosis not present

## 2021-05-02 DIAGNOSIS — J9811 Atelectasis: Secondary | ICD-10-CM | POA: Diagnosis not present

## 2021-05-02 DIAGNOSIS — J9809 Other diseases of bronchus, not elsewhere classified: Secondary | ICD-10-CM | POA: Diagnosis not present

## 2021-05-02 MED ORDER — IOPAMIDOL (ISOVUE-300) INJECTION 61%
75.0000 mL | Freq: Once | INTRAVENOUS | Status: AC | PRN
  Administered 2021-05-02: 14:00:00 75 mL via INTRAVENOUS

## 2021-05-03 ENCOUNTER — Ambulatory Visit: Payer: Medicaid Other | Attending: Orthopaedic Surgery | Admitting: Physical Therapy

## 2021-05-03 ENCOUNTER — Other Ambulatory Visit: Payer: Self-pay

## 2021-05-03 DIAGNOSIS — M6281 Muscle weakness (generalized): Secondary | ICD-10-CM

## 2021-05-03 DIAGNOSIS — Z96652 Presence of left artificial knee joint: Secondary | ICD-10-CM | POA: Insufficient documentation

## 2021-05-03 DIAGNOSIS — R2689 Other abnormalities of gait and mobility: Secondary | ICD-10-CM | POA: Diagnosis present

## 2021-05-03 DIAGNOSIS — M25562 Pain in left knee: Secondary | ICD-10-CM

## 2021-05-03 NOTE — Patient Instructions (Signed)
Access Code: [email or web address] URL: [email or web address] Date: 05/03/2021 Prepared by: Shearon Balo  Exercises Long Sitting Quad Set with Towel Roll Under Heel - 5 x daily - 7 x weekly - 3 sets - 10 reps Sitting Heel Slide with Towel - 3 x daily - 7 x weekly - 3 sets - 10 reps

## 2021-05-03 NOTE — Therapy (Signed)
[place], [place], [address] Phone: [phone]   Fax:  [phone]  Physical Therapy Evaluation  Patient Details  Name: Deborah [name] MRN: [id number] Date of Birth: 1955-08-08 Referring Provider (PT): Leandrew Koyanagi, MD  Encounter Date: 05/03/2021   PT End of Session - 05/04/21 0950     Visit Number 1    Number of Visits 16    Date for PT Re-Evaluation 06/29/21    Authorization Type MCD - [employer]    PT Start Time 0145    PT Stop Time 0230    PT Time Calculation (min) 45 min             Past Medical History:  Diagnosis Date   Anemia    h/o of   Anxiety    Arthritis    in knees   Asthma    Breast cancer ([place])    Bronchitis    COPD (chronic obstructive pulmonary disease) ([place])    Diverticulosis 11/15/2012   noted on screening colonoscopy    Dyspnea    with COPD exerbation   Esophageal stricture    Former smoker 03/19/2011   GERD (gastroesophageal reflux disease)    Heart murmur    asymptomatic    History of laryngectomy    History of radiation therapy 11/15/18- 12/06/18   Right Breast total dose 42.56 Gy in 16 fractions.    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   Leukocytopenia    Nausea alone 07/28/2013   Neck pain 01/21/2012   Normal MRI 07/14/2011   negative for mestasis    Pneumonia 2012   Sciatica    Seizures ([place])    07/24/11 off Effexor w/o seizure   Sepsis ([place]) 08/04/2012   Sinusitis, chronic 07/20/2011   Bilateral maxillary, identified on MRI of head 07/14/11.     Tracheostomy dependent [facility])     Past Surgical History:  Procedure Laterality Date   BREAST LUMPECTOMY Right 07/06/2018   BREAST LUMPECTOMY WITH  RADIOACTIVE SEED LOCALIZATION Right 07/06/2018   Procedure: RIGHT BREAST LUMPECTOMY WITH RADIOACTIVE SEED LOCALIZATION;  Surgeon: Rolm Bookbinder, MD;  Location: [place];  Service: General;  Laterality: Right;   COLONOSCOPY N/A 11/15/2012   Procedure: COLONOSCOPY;  Surgeon: Lafayette Dragon, MD;  [facility];  Service: Endoscopy;  Laterality: N/A;   DENTAL RESTORATION/EXTRACTION WITH X-RAY     ESOPHAGEAL DILATION N/A 09/09/2019   Procedure: ESOPHAGEAL DILATION;  Surgeon: [name], MD;  Location: [place];  Service: ENT;  Laterality: N/A;   ESOPHAGEAL DILATION N/A 10/05/2019   Procedure: ESOPHAGEAL DILATION;  Surgeon: [name], MD;  Location: [place];  Service: ENT;  Laterality: N/A;  via tracheostomy   ESOPHAGEAL DILATION N/A 02/27/2020   Procedure: ESOPHAGEAL DILATION;  Surgeon: [name], MD;  Location: [place];  Service: ENT;  Laterality: N/A;   ESOPHAGOGASTRODUODENOSCOPY (EGD) WITH PROPOFOL N/A 08/12/2018   Procedure: ESOPHAGOGASTRODUODENOSCOPY (EGD) WITH PROPOFOL;  Surgeon: Doran Stabler, MD;  Location: [place];  Service: Gastroenterology;  Laterality: N/A;   ESOPHAGOSCOPY  06/21/2012   Procedure: ESOPHAGOSCOPY;  Surgeon: [name], MD;  Location: [place];  Service: ENT;  Laterality:  N/A;   ESOPHAGOSCOPY WITH DILITATION N/A 09/21/2014   Procedure: ESOPHAGOSCOPY WITH DILITATION;  Surgeon: [name], MD;  Location: [place];  Service: ENT;  Laterality: N/A;   ESOPHAGOSCOPY WITH DILITATION N/A 07/04/2016   Procedure: ESOPHAGOSCOPY WITH DILITATION;  Surgeon: [name], MD;  Location: [place];  Service: ENT;  Laterality: N/A;   ESOPHAGOSCOPY WITH DILITATION N/A 12/01/2017   Procedure: ESOPHAGOSCOPY WITH DILITATION;  Surgeon: [name], MD;  Location: [place];  Service: ENT;  Laterality: N/A;   ESOPHAGOSCOPY WITH DILITATION N/A 04/19/2018   Procedure: ESOPHAGOSCOPY WITH DILITATION;  Surgeon: [name],  MD;  Location: [place];  Service: ENT;  Laterality: N/A;   ESOPHAGOSCOPY WITH DILITATION N/A 03/07/2019   Procedure: Esophagoscopy with dilatation;  Surgeon: [name], MD;  Location: [place];  Service: ENT;  Laterality: N/A;   ESOPHAGOSCOPY WITH DILITATION N/A 07/16/2020   Procedure: ESOPHAGOSCOPY WITH DILATION;  Surgeon: [name], MD;  Location: [place];  Service: ENT;  Laterality: N/A;   [place] N/A 10/08/2020   Procedure: ESOPHAGOSCOPY With Dilation;  Surgeon: [name], MD;  Location: [place];  Service: ENT;  Laterality: N/A;  21 French to [place]  01/08/2018       FOREIGN BODY REMOVAL BRONCHIAL  10/02/2011   Procedure: REMOVAL FOREIGN BODY BRONCHIAL;  Surgeon: Ruby Cola, MD;  Location: [place];  Service: ENT;  Laterality: N/A;   FOREIGN BODY REMOVAL BRONCHIAL N/A 01/08/2018   Procedure: REMOVAL FOREIGN BODY BRONCHIAL;  Surgeon: Jodi Marble, MD;  [facility];  Service: ENT;  Laterality: N/A;   LARYNGECTOMY     Porta cath removal     PORTACATH PLACEMENT  09/17/10   Tip in cavoatrial junction   RE-EXCISION OF BREAST CANCER,SUPERIOR MARGINS Right 07/29/2018   Procedure: RE-EXCISION OF RIGHT BREAST MEDIAL MARGINS;  Surgeon: Rolm Bookbinder, MD;  Location: [place];  Service: General;  Laterality: Right;   RIGID ESOPHAGOSCOPY N/A 03/19/2019   Procedure: FLEXIBLE ESOPHAGOSCOPY;  Surgeon: Jodi Marble, MD;  Location: [place];  Service: ENT;  Laterality: N/A;   STOMAPLASTY N/A 10/21/2016   Procedure: Zola Button;  Surgeon: [name], MD;  Location: [place];  Service: ENT;  Laterality: N/A;   TOTAL KNEE ARTHROPLASTY Right 12/12/2019   Procedure: RIGHT TOTAL KNEE ARTHROPLASTY;  Surgeon: Leandrew Koyanagi, MD;  Location: [place];  Service: Orthopedics;  Laterality: Right;   TOTAL KNEE ARTHROPLASTY Left 04/01/2021   Procedure: LEFT TOTAL KNEE ARTHROPLASTY;  Surgeon: Leandrew Koyanagi, MD;  Location:  [place];  Service: Orthopedics;  Laterality: Left;   TRACHEAL DILITATION  07/16/2011   Procedure: TRACHEAL DILITATION;  Surgeon: Beckie Salts, MD;  Location: [place];  Service: ENT;  Laterality: N/A;  dilation of tracheal stoma and replacement of stoma tube   TUBAL LIGATION  1982    There were no vitals filed for this visit.  Subjective:   Deborah [name] is a 65 y.o. female who presents to clinic with chief complaint of L knee pain after TKA on 04/01/21.    MOI/History of condition: Chronic L knee pain treated with L TKA on 04/01/21.    She has not had PT yet.    Red flags: denies malaise, erythema / streaking, and chills / night sweats  Pain location: Diffuse L knee pain 48 hour pain intensity:  highest 10/10 current 10/10,  best 10/10 Aggs: movement, ROM Eases: rest, ice, elevation Nature: aching, burning, sharp, and  throbbing Severity: high Irritability: high Stage: Subacute Stability: unchanged 24 hour pattern: NA  Vocation/requirements: no Hobbies: walk for recreation Functional limitations/goals (if different from vocation and hobbies): squatting, stairs, ambulation Home environment: lives with husband at home, 3 STE Assistive device: none  Significant PMH: tracheitis, COPD, peripheral polyneruopathy, hx of seizure (several years ago), hx of cancer, hx of R TKA     [facility] PT Assessment - 05/04/21 0001       Assessment   Medical Diagnosis Referral diagnosis: Status post total left knee replacement (J85.631)    Referring Provider (PT) Leandrew Koyanagi, MD    Onset Date/Surgical Date 04/01/21      Precautions   Precaution Comments Significant PMH: tracheitis, COPD, peripheral polyneruopathy, hx of seizure (several years ago), hx of cancer      Restrictions   Other Position/Activity Restrictions none      Balance Screen   Has the patient fallen in the past 6 months No      Observation/Other Assessments   Observations Calf supple and non tender; antalgic gait  with reduced time in stance on L      Functional Tests   Functional tests Other;Sit to Stand;Other2      Sit to Stand   Comments 30'': 5x      Other:   Other/ Comments KOOS Jr. Score: 42/100      Other:   Other/Comments Gait speed: 1 m/s (10 m)      AROM   Right Knee Extension 3    Right Knee Flexion 100    Left Knee Extension 7    Left Knee Flexion 80      Strength   Overall Strength Comments L knee unreliable d/t pain    Right Knee Flexion 4/5    Right Knee Extension 5/5                        Objective measurements completed on examination: See above findings.                PT Education - 05/04/21 0949     Education Details POC, diagnosis, prognosis, HEP.  Pt educated via explanation, demonstration, and handout (HEP).  Pt confirms understanding verbally.              PT Short Term Goals - 05/04/21 0953       PT SHORT TERM GOAL #1   Title Deborah [name] will be >75% HEP compliant to improve carryover between sessions and facilitate independent management of condition    Target Date 05/25/21               PT Long Term Goals - 05/04/21 0953       PT LONG TERM GOAL #1   Title Target date for all long term goals:  06/27/20      PT LONG TERM GOAL #2   Title Deborah [name] will improve KOOS, Jr. score from 42 (on evaluation) to 62 as a proxy for functional improvement      PT LONG TERM GOAL #3   Title Deborah [name] will achieve 3 degrees knee extension by D/C (see POC end date) to improve stability in stance phase of gait  EVAL: 7 degrees      PT LONG TERM GOAL #4   Title Deborah [name] will achieve 100 degrees knee flexion by D/C (see POC end date) to improve ability to safely navigate steps in the community  EVAL: 80 degrees  PT LONG TERM GOAL #5   Title Deborah [name] will improve 30'' STS (MCID 2) to >/= 8x (w/ UE?: n) to show improved LE strength and improved transfers  EVAL: 5x  w/ UE? Y      Additional Long Term Goals   Additional Long Term Goals Yes       PT LONG TERM GOAL #6   Title Deborah [name] will be able to put shoes and socks on, not limited by pain  EVAL: limited                    Plan - 05/04/21 0951     Clinical Impression Statement Deborah [name] is a 65 y.o. female who presents to clinic with signs and sxs consistent with L knee pain secondary to L TKA on 04/01/21.  Pt presents with pain and impairments/deficits in: L knee ROM, strength, gait, balance.  Activity limitations include: steps, squatting, bending, ambulation.  Participation limitations include: walking for recreation, ADLs involving listed activity limitiations.  Pt will benefit from skilled therapy to address pain and the listed deficits in order to achieve functional goals, enable safety and independence in completion of daily tasks, and return to PLOF.    Stability/Clinical Decision Making Stable/Uncomplicated    Clinical Decision Making Low    Rehab Potential Good    PT Frequency 2x / week    PT Duration 8 weeks    PT Treatment/Interventions ADLs/Self Care Home Management;Aquatic Therapy;Electrical Stimulation;Iontophoresis 101m/ml Dexamethasone;Gait training;Therapeutic activities;Therapeutic exercise;Neuromuscular re-education;Manual techniques;Dry needling;Vasopneumatic Device    [facility]            Patient will benefit from skilled therapeutic intervention in order to improve the following deficits and impairments:  Abnormal gait, Decreased balance, Difficulty walking, Pain, Impaired flexibility, Decreased strength, Decreased range of motion  Visit Diagnosis: Left knee pain, unspecified chronicity  Muscle weakness (generalized)  Other abnormalities of gait and mobility     Problem List Patient Active Problem List   Diagnosis Date Noted   Status post total left knee replacement 04/01/2021   Primary osteoarthritis of left knee 03/31/2021   Peripheral polyneuropathy 12/12/2020   Status post total right knee replacement 12/12/2019    Primary osteoarthritis of right knee 12/11/2019   Localized swelling, mass and lump, neck 07/20/2019   Allergic rhinitis 05/20/2019   GERD (gastroesophageal reflux disease)    COPD (chronic obstructive pulmonary disease) (HCC)    Chronic diastolic (congestive) heart failure ([place]    Breast cancer ([place] 07/06/2018   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 07/17/2011   Hypothyroidism 03/19/2011   Essential hypertension 03/19/2011   Larynx cancer ([place] 07/31/2010    Deborah [name] PT 05/04/2021, 9:54 AM  [facility] [address]Phone: [phone]  Fax:  [phone] Name: MSamona ChihuahuaMRN: [id number] of  Birth: 05/04/1956

## 2021-05-08 ENCOUNTER — Other Ambulatory Visit: Payer: Self-pay

## 2021-05-08 ENCOUNTER — Ambulatory Visit: Payer: Medicaid Other

## 2021-05-08 DIAGNOSIS — M25562 Pain in left knee: Secondary | ICD-10-CM | POA: Diagnosis not present

## 2021-05-08 DIAGNOSIS — M6281 Muscle weakness (generalized): Secondary | ICD-10-CM

## 2021-05-08 DIAGNOSIS — R2689 Other abnormalities of gait and mobility: Secondary | ICD-10-CM

## 2021-05-08 NOTE — Therapy (Signed)
[place], [place], [address] Phone: [phone]   Fax:  [phone]  Physical Therapy Treatment  Patient Details  Name: Deborah [name] MRN: [id number] Date of Birth: [birth year] Referring Provider (PT): Leandrew Koyanagi, MD   Encounter Date: 05/08/2021   PT End of Session - 05/08/21 1528     Visit Number 2    Number of Visits 16    Date for PT Re-Evaluation 06/29/21    Authorization Type MCD - [employer]    PT Start Time 1530    PT Stop Time 1610   10 min game ready   PT Time Calculation (min) 40 min             Past Medical History:  Diagnosis Date   Anemia    h/o of   Anxiety    Arthritis    in knees   Asthma    Breast cancer ([place])    Bronchitis    COPD (chronic obstructive pulmonary disease) ([place])    Diverticulosis 11/15/2012   noted on screening colonoscopy    Dyspnea    with COPD exerbation   Esophageal stricture    Former smoker 03/19/2011   GERD (gastroesophageal reflux disease)    Heart murmur    asymptomatic    History of laryngectomy    History of radiation therapy 11/15/18- 12/06/18   Right Breast total dose 42.56 Gy in 16 fractions.    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   Leukocytopenia    Nausea alone 07/28/2013   Neck pain 01/21/2012   Normal MRI 07/14/2011   negative for mestasis    Pneumonia 2012   Sciatica    Seizures ([place])    07/24/11 off Effexor w/o seizure   Sepsis ([place]) 08/04/2012   Sinusitis, chronic 07/20/2011   Bilateral maxillary, identified on MRI of head 07/14/11.     Tracheostomy dependent [facility])     Past Surgical History:  Procedure Laterality Date   BREAST LUMPECTOMY Right 07/06/2018    BREAST LUMPECTOMY WITH RADIOACTIVE SEED LOCALIZATION Right 07/06/2018   Procedure: RIGHT BREAST LUMPECTOMY WITH RADIOACTIVE SEED LOCALIZATION;  Surgeon: Rolm Bookbinder, MD;  Location: [place];  Service: General;  Laterality: Right;   COLONOSCOPY N/A 11/15/2012   Procedure: COLONOSCOPY;  Surgeon: Lafayette Dragon, MD;  [facility];  Service: Endoscopy;  Laterality: N/A;   DENTAL RESTORATION/EXTRACTION WITH X-RAY     ESOPHAGEAL DILATION N/A 09/09/2019   Procedure: ESOPHAGEAL DILATION;  Surgeon: [name], MD;  Location: [place];  Service: ENT;  Laterality: N/A;   ESOPHAGEAL DILATION N/A 10/05/2019   Procedure: ESOPHAGEAL DILATION;  Surgeon: [name], MD;  Location: [place];  Service: ENT;  Laterality: N/A;  via tracheostomy   ESOPHAGEAL DILATION N/A 02/27/2020   Procedure: ESOPHAGEAL DILATION;  Surgeon: [name], MD;  Location: [place];  Service: ENT;  Laterality: N/A;   ESOPHAGOGASTRODUODENOSCOPY (EGD) WITH PROPOFOL N/A 08/12/2018   Procedure: ESOPHAGOGASTRODUODENOSCOPY (EGD) WITH PROPOFOL;  Surgeon: Doran Stabler, MD;  Location: [place];  Service: Gastroenterology;  Laterality: N/A;   ESOPHAGOSCOPY  06/21/2012   Procedure: ESOPHAGOSCOPY;  Surgeon: [name], MD;  Location[facility];  Service: ENT;  Laterality: N/A;   ESOPHAGOSCOPY WITH DILITATION N/A 09/21/2014   Procedure: ESOPHAGOSCOPY WITH DILITATION;  Surgeon: [name], MD;  Location: [place];  Service: ENT;  Laterality: N/A;   ESOPHAGOSCOPY WITH DILITATION N/A 07/04/2016   Procedure: ESOPHAGOSCOPY WITH DILITATION;  Surgeon: [name], MD;  Location: [place];  Service: ENT;  Laterality: N/A;   ESOPHAGOSCOPY WITH DILITATION N/A 12/01/2017   Procedure: ESOPHAGOSCOPY WITH DILITATION;  Surgeon: [name], MD;  Location: [place];  Service: ENT;  Laterality: N/A;   ESOPHAGOSCOPY WITH DILITATION N/A 04/19/2018   Procedure: ESOPHAGOSCOPY WITH DILITATION;   Surgeon: [name], MD;  Location: [place];  Service: ENT;  Laterality: N/A;   ESOPHAGOSCOPY WITH DILITATION N/A 03/07/2019   Procedure: Esophagoscopy with dilatation;  Surgeon: [name], MD;  Location: [place];  Service: ENT;  Laterality: N/A;   ESOPHAGOSCOPY WITH DILITATION N/A 07/16/2020   Procedure: ESOPHAGOSCOPY WITH DILATION;  Surgeon: [name], MD;  Location: [place];  Service: ENT;  Laterality: N/A;   [place] N/A 10/08/2020   Procedure: ESOPHAGOSCOPY With Dilation;  Surgeon: [name], MD;  [place]: [place] ;  Service: ENT;  Laterality: N/A;  21 French to [place]  01/08/2018       FOREIGN BODY REMOVAL BRONCHIAL  10/02/2011   Procedure: REMOVAL FOREIGN BODY BRONCHIAL;  Surgeon: Ruby Cola, MD;  Location: [place];  Service: ENT;  Laterality: N/A;   FOREIGN BODY REMOVAL BRONCHIAL N/A 01/08/2018   Procedure: REMOVAL FOREIGN BODY BRONCHIAL;  Surgeon: Jodi Marble, MD;  [facility];  Service: ENT;  Laterality: N/A;   LARYNGECTOMY     Porta cath removal     PORTACATH PLACEMENT  09/17/10   Tip in cavoatrial junction   RE-EXCISION OF BREAST CANCER,SUPERIOR MARGINS Right 07/29/2018   Procedure: RE-EXCISION OF RIGHT BREAST MEDIAL MARGINS;  Surgeon: Rolm Bookbinder, MD;  Location: [place];  Service: General;  Laterality: Right;   RIGID ESOPHAGOSCOPY N/A 03/19/2019   Procedure: FLEXIBLE ESOPHAGOSCOPY;  Surgeon: Jodi Marble, MD;  Location: [place];  Service: ENT;  Laterality: N/A;   STOMAPLASTY N/A 10/21/2016   Procedure: Zola Button;  Surgeon: [name], MD;  Location: [place];  Service: ENT;  Laterality: N/A;   TOTAL KNEE ARTHROPLASTY Right 12/12/2019   Procedure: RIGHT TOTAL KNEE ARTHROPLASTY;  Surgeon: Leandrew Koyanagi, MD;  Location: [place];  Service: Orthopedics;  Laterality: Right;   TOTAL KNEE ARTHROPLASTY Left 04/01/2021   Procedure: LEFT TOTAL KNEE ARTHROPLASTY;  Surgeon: Leandrew Koyanagi, MD;  Location: [place];  Service: Orthopedics;  Laterality: Left;   TRACHEAL DILITATION  07/16/2011   Procedure: TRACHEAL DILITATION;  Surgeon: Beckie Salts, MD;  Location: [place];  Service: ENT;  Laterality: N/A;  dilation of tracheal stoma and replacement of stoma tube   TUBAL LIGATION  1982    There were no vitals filed for this visit.   Subjective Assessment - 05/08/21 1529     Subjective Pt presents to PT with reports of continued significant L knee pain and discomfort. She notes continued swelling and pain over last few days. Has been compliant with HEP with no adverse effect. She is ready to begin PT at this time.    Currently in Pain? Yes    Pain Score 7     Pain Location Knee    Pain Orientation Left           [facility] Adult  PT Treatment/Exercise:   Therapeutic Exercise:  NuStep lvl 4 UE/LE x 4 min while taking subjective LAQ 3x10 2lbs Seated heel slide x 10 - 5"  STS 2x10 - no UE support Supine quad set L LE w/ towel @ heel x 10 - 5" Supine SLR 2x10 L Step ups 2x10 fwd 8in  Manual Therapy: N/A   Neuromuscular re-ed: N/A   Therapeutic Activity: N/A   Modalities: GameReady x 10 min L knee Low compression 36 deg   Self Care: N/A   Consider / progression for next session:      [facility] PT Assessment - 05/08/21 0001       AROM   Left Knee Extension 5    Left Knee Flexion 100                                      PT Short Term Goals - 05/04/21 0953       PT SHORT TERM GOAL #1   Title Deborah [name] will be >75% HEP compliant to improve carryover between sessions and facilitate independent management of condition    Target Date 05/25/21               PT Long Term Goals - 05/04/21 0953       PT LONG TERM GOAL #1   Title Target date for all long term goals:  06/27/20      PT LONG TERM GOAL #2   Title Deborah [name] will improve KOOS, Jr. score from 42 (on evaluation) to 62 as a proxy for functional improvement      PT LONG  TERM GOAL #3   Title Deborah [name] will achieve 3 degrees knee extension by D/C (see POC end date) to improve stability in stance phase of gait  EVAL: 7 degrees      PT LONG TERM GOAL #4   Title Deborah [name] will achieve 100 degrees knee flexion by D/C (see POC end date) to improve ability to safely navigate steps in the community  EVAL: 80 degrees      PT LONG TERM GOAL #5   Title Deborah [name] will improve 30'' STS (MCID 2) to >/= 8x (w/ UE?: n) to show improved LE strength and improved transfers  EVAL: 5x  w/ UE? Y      Additional Long Term Goals   Additional Long Term Goals Yes      PT LONG TERM GOAL #6   Title Deborah [name] will be able to put shoes and socks on, not limited by pain  EVAL: limited                   Plan - 05/08/21 1547     Clinical Impression Statement Pt was able to complete prescribed exercises with no adverse effect or increase in pain. Today's session focused on improving L knee strength and ROM post sx. She is progressing well with therapy, showing improving mobility and L knee ROM today. She continues to benefit from skilled PT services and will continue to be seen and progressed as able.    PT Treatment/Interventions ADLs/Self Care Home Management;Aquatic Therapy;Electrical Stimulation;Iontophoresis 11m/ml Dexamethasone;Gait training;Therapeutic activities;Therapeutic exercise;Neuromuscular re-education;Manual techniques;Dry needling;Vasopneumatic Device    PT Next Visit Plan progress L knee strength and ROM as able    [facility]            Patient will benefit from skilled therapeutic intervention in  order to improve the following deficits and impairments:  Abnormal gait, Decreased balance, Difficulty walking, Pain, Impaired flexibility, Decreased strength, Decreased range of motion  Visit Diagnosis: Left knee pain, unspecified chronicity  Muscle weakness (generalized)  Other abnormalities of gait and mobility     Problem List Patient Active  Problem List   Diagnosis Date Noted   Status post total left knee replacement 04/01/2021   Primary osteoarthritis of left knee 03/31/2021   Peripheral polyneuropathy 12/12/2020   Status post total right knee replacement 12/12/2019   Primary osteoarthritis of right knee 12/11/2019   Localized swelling, mass and lump, neck 07/20/2019   Allergic rhinitis 05/20/2019   GERD (gastroesophageal reflux disease)    COPD (chronic obstructive pulmonary disease) (HCC)    Chronic diastolic (congestive) heart failure ([place])    Breast cancer ([place]) 07/06/2018   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   Major depressive disorder 04/08/2018   Carpal tunnel syndrome 03/16/2018   Obesity 08/24/2017   Plantar fasciitis 06/06/2016   Low back pain 05/21/2016   Osteoarthritis of knees, bilateral 05/31/2015   Knee pain 01/04/2015   Dysphagia 08/16/2014   Weight gain 08/05/2014   DOE (dyspnea on exertion) 04/28/2014   Frequent falls 12/23/2013   Anemia in chronic illness 08/11/2012   Status post trachelectomy 08/04/2012   Chronic pain 01/20/2012   History of head and neck cancer 09/26/2011   Hx of radiation therapy    Tracheitis 07/17/2011   Seizure ([place]) 07/17/2011   Hypothyroidism 03/19/2011   Essential hypertension 03/19/2011   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 [place], [place], [address] Phone: [phone]   Fax:  [phone]  Name: Deborah [name] MRN: [id number] Date of Birth: 09-25-1955

## 2021-05-11 ENCOUNTER — Encounter (HOSPITAL_COMMUNITY)
Admission: EM | Disposition: A | Discharge status: Home / Self Care | Payer: Self-pay | Source: Home / Self Care | Attending: Emergency Medicine

## 2021-05-11 ENCOUNTER — Emergency Department (HOSPITAL_COMMUNITY): Payer: Medicaid Other | Admitting: Critical Care Medicine

## 2021-05-11 ENCOUNTER — Ambulatory Visit (HOSPITAL_COMMUNITY)
Admission: EM | Admit: 2021-05-11 | Discharge: 2021-05-11 | Disposition: A | Discharge status: Home / Self Care | Payer: Medicaid Other | Attending: Emergency Medicine | Admitting: Emergency Medicine

## 2021-05-11 ENCOUNTER — Emergency Department (HOSPITAL_COMMUNITY): Payer: Medicaid Other

## 2021-05-11 ENCOUNTER — Encounter (HOSPITAL_COMMUNITY): Payer: Self-pay | Admitting: Emergency Medicine

## 2021-05-11 ENCOUNTER — Other Ambulatory Visit: Payer: Self-pay

## 2021-05-11 DIAGNOSIS — Z20822 Contact with and (suspected) exposure to covid-19: Secondary | ICD-10-CM | POA: Insufficient documentation

## 2021-05-11 DIAGNOSIS — I5032 Chronic diastolic (congestive) heart failure: Secondary | ICD-10-CM | POA: Diagnosis not present

## 2021-05-11 DIAGNOSIS — T17898A Other foreign object in other parts of respiratory tract causing other injury, initial encounter: Secondary | ICD-10-CM | POA: Insufficient documentation

## 2021-05-11 DIAGNOSIS — Z930 Tracheostomy status: Secondary | ICD-10-CM | POA: Diagnosis not present

## 2021-05-11 DIAGNOSIS — Z9221 Personal history of antineoplastic chemotherapy: Secondary | ICD-10-CM | POA: Insufficient documentation

## 2021-05-11 DIAGNOSIS — J9811 Atelectasis: Secondary | ICD-10-CM | POA: Insufficient documentation

## 2021-05-11 DIAGNOSIS — Z9002 Acquired absence of larynx: Secondary | ICD-10-CM | POA: Insufficient documentation

## 2021-05-11 DIAGNOSIS — F32A Depression, unspecified: Secondary | ICD-10-CM | POA: Diagnosis not present

## 2021-05-11 DIAGNOSIS — I110 Hypertensive heart disease with heart failure: Secondary | ICD-10-CM | POA: Insufficient documentation

## 2021-05-11 DIAGNOSIS — F419 Anxiety disorder, unspecified: Secondary | ICD-10-CM | POA: Insufficient documentation

## 2021-05-11 DIAGNOSIS — T17508A Unspecified foreign body in bronchus causing other injury, initial encounter: Secondary | ICD-10-CM

## 2021-05-11 DIAGNOSIS — X58XXXA Exposure to other specified factors, initial encounter: Secondary | ICD-10-CM | POA: Diagnosis not present

## 2021-05-11 DIAGNOSIS — R0789 Other chest pain: Secondary | ICD-10-CM | POA: Diagnosis not present

## 2021-05-11 DIAGNOSIS — J9809 Other diseases of bronchus, not elsewhere classified: Secondary | ICD-10-CM

## 2021-05-11 DIAGNOSIS — Z923 Personal history of irradiation: Secondary | ICD-10-CM | POA: Insufficient documentation

## 2021-05-11 DIAGNOSIS — L929 Granulomatous disorder of the skin and subcutaneous tissue, unspecified: Secondary | ICD-10-CM | POA: Diagnosis not present

## 2021-05-11 DIAGNOSIS — T17908A Unspecified foreign body in respiratory tract, part unspecified causing other injury, initial encounter: Secondary | ICD-10-CM | POA: Diagnosis not present

## 2021-05-11 DIAGNOSIS — J449 Chronic obstructive pulmonary disease, unspecified: Secondary | ICD-10-CM | POA: Insufficient documentation

## 2021-05-11 DIAGNOSIS — E039 Hypothyroidism, unspecified: Secondary | ICD-10-CM | POA: Diagnosis not present

## 2021-05-11 DIAGNOSIS — K219 Gastro-esophageal reflux disease without esophagitis: Secondary | ICD-10-CM | POA: Diagnosis not present

## 2021-05-11 DIAGNOSIS — Z87891 Personal history of nicotine dependence: Secondary | ICD-10-CM | POA: Diagnosis not present

## 2021-05-11 DIAGNOSIS — Z6836 Body mass index (BMI) 36.0-36.9, adult: Secondary | ICD-10-CM | POA: Insufficient documentation

## 2021-05-11 DIAGNOSIS — R0602 Shortness of breath: Secondary | ICD-10-CM | POA: Diagnosis not present

## 2021-05-11 DIAGNOSIS — D649 Anemia, unspecified: Secondary | ICD-10-CM | POA: Diagnosis not present

## 2021-05-11 DIAGNOSIS — J984 Other disorders of lung: Secondary | ICD-10-CM | POA: Diagnosis not present

## 2021-05-11 DIAGNOSIS — Z8521 Personal history of malignant neoplasm of larynx: Secondary | ICD-10-CM | POA: Diagnosis not present

## 2021-05-11 DIAGNOSIS — D638 Anemia in other chronic diseases classified elsewhere: Secondary | ICD-10-CM | POA: Diagnosis not present

## 2021-05-11 DIAGNOSIS — T17808A Unspecified foreign body in other parts of respiratory tract causing other injury, initial encounter: Secondary | ICD-10-CM | POA: Diagnosis not present

## 2021-05-11 DIAGNOSIS — T17500A Unspecified foreign body in bronchus causing asphyxiation, initial encounter: Secondary | ICD-10-CM | POA: Diagnosis not present

## 2021-05-11 HISTORY — PX: VIDEO BRONCHOSCOPY: SHX5072

## 2021-05-11 HISTORY — PX: CRYOTHERAPY: SHX6894

## 2021-05-11 HISTORY — PX: HEMOSTASIS CONTROL: SHX6838

## 2021-05-11 HISTORY — PX: FOREIGN BODY REMOVAL: SHX962

## 2021-05-11 LAB — COMPREHENSIVE METABOLIC PANEL
ALT: 17 U/L (ref 0–44)
AST: 23 U/L (ref 15–41)
Albumin: 3.9 g/dL (ref 3.5–5.0)
Alkaline Phosphatase: 83 U/L (ref 38–126)
Anion gap: 16 — ABNORMAL HIGH (ref 5–15)
BUN: 10 mg/dL (ref 8–23)
CO2: 22 mmol/L (ref 22–32)
Calcium: 8.7 mg/dL — ABNORMAL LOW (ref 8.9–10.3)
Chloride: 99 mmol/L (ref 98–111)
Creatinine, Ser: 1.05 mg/dL — ABNORMAL HIGH (ref 0.44–1.00)
GFR, Estimated: 60 mL/min — ABNORMAL LOW (ref >60)
Glucose, Bld: 97 mg/dL (ref 70–99)
Potassium: 3 mmol/L — ABNORMAL LOW (ref 3.5–5.1)
Sodium: 137 mmol/L (ref 135–145)
Total Bilirubin: 0.4 mg/dL (ref 0.3–1.2)
Total Protein: 8.1 g/dL (ref 6.5–8.1)

## 2021-05-11 LAB — CBC WITH DIFFERENTIAL/PLATELET
Abs Immature Granulocytes: 0.03 10*3/uL (ref 0.00–0.07)
Basophils Absolute: 0.1 10*3/uL (ref 0.0–0.1)
Basophils Relative: 1 %
Eosinophils Absolute: 0.4 10*3/uL (ref 0.0–0.5)
Eosinophils Relative: 7 %
HCT: 37.4 % (ref 36.0–46.0)
Hemoglobin: 12 g/dL (ref 12.0–15.0)
Immature Granulocytes: 1 %
Lymphocytes Relative: 31 %
Lymphs Abs: 1.8 10*3/uL (ref 0.7–4.0)
MCH: 30.1 pg (ref 26.0–34.0)
MCHC: 32.1 g/dL (ref 30.0–36.0)
MCV: 93.7 fL (ref 80.0–100.0)
Monocytes Absolute: 0.5 10*3/uL (ref 0.1–1.0)
Monocytes Relative: 9 %
Neutro Abs: 3 10*3/uL (ref 1.7–7.7)
Neutrophils Relative %: 51 %
Platelets: 395 10*3/uL (ref 150–400)
RBC: 3.99 MIL/uL (ref 3.87–5.11)
RDW: 13.3 % (ref 11.5–15.5)
WBC: 5.8 10*3/uL (ref 4.0–10.5)
nRBC: 0 % (ref 0.0–0.2)

## 2021-05-11 LAB — RESP PANEL BY RT-PCR (FLU A&B, COVID) ARPGX2
Influenza A by PCR: NEGATIVE
Influenza B by PCR: NEGATIVE
SARS Coronavirus 2 by RT PCR: NEGATIVE

## 2021-05-11 LAB — TROPONIN I (HIGH SENSITIVITY): Troponin I (High Sensitivity): 9 ng/L (ref <18)

## 2021-05-11 SURGERY — VIDEO BRONCHOSCOPY WITHOUT FLUORO
Anesthesia: General | Laterality: Bilateral

## 2021-05-11 MED ORDER — ONDANSETRON HCL 4 MG/2ML IJ SOLN
INTRAMUSCULAR | Status: DC | PRN
  Administered 2021-05-11: 4 mg via INTRAVENOUS

## 2021-05-11 MED ORDER — ONDANSETRON HCL 4 MG/2ML IJ SOLN
4.0000 mg | Freq: Once | INTRAMUSCULAR | Status: AC
  Administered 2021-05-11: 4 mg via INTRAVENOUS
  Filled 2021-05-11: qty 2

## 2021-05-11 MED ORDER — ROCURONIUM BROMIDE 10 MG/ML (PF) SYRINGE
PREFILLED_SYRINGE | INTRAVENOUS | Status: DC | PRN
  Administered 2021-05-11: 40 mg via INTRAVENOUS

## 2021-05-11 MED ORDER — PROPOFOL 10 MG/ML IV BOLUS
INTRAVENOUS | Status: DC | PRN
  Administered 2021-05-11: 100 mg via INTRAVENOUS

## 2021-05-11 MED ORDER — LIDOCAINE 2% (20 MG/ML) 5 ML SYRINGE
INTRAMUSCULAR | Status: DC | PRN
  Administered 2021-05-11: 60 mg via INTRAVENOUS

## 2021-05-11 MED ORDER — FENTANYL CITRATE (PF) 100 MCG/2ML IJ SOLN
INTRAMUSCULAR | Status: AC
  Filled 2021-05-11: qty 2

## 2021-05-11 MED ORDER — SODIUM CHLORIDE (PF) 0.9 % IJ SOLN
PREFILLED_SYRINGE | INTRAMUSCULAR | Status: DC | PRN
  Administered 2021-05-11: 4 mL

## 2021-05-11 MED ORDER — SUGAMMADEX SODIUM 200 MG/2ML IV SOLN
INTRAVENOUS | Status: DC | PRN
  Administered 2021-05-11: 200 mg via INTRAVENOUS

## 2021-05-11 MED ORDER — LACTATED RINGERS IV SOLN
INTRAVENOUS | Status: AC | PRN
  Administered 2021-05-11: 1000 mL via INTRAVENOUS

## 2021-05-11 MED ORDER — EPINEPHRINE 1 MG/10ML IJ SOSY
PREFILLED_SYRINGE | INTRAMUSCULAR | Status: AC
  Filled 2021-05-11: qty 10

## 2021-05-11 MED ORDER — DEXAMETHASONE SODIUM PHOSPHATE 10 MG/ML IJ SOLN
INTRAMUSCULAR | Status: DC | PRN
Start: 2021-05-11 — End: 2021-05-11
  Administered 2021-05-11: 10 mg via INTRAVENOUS

## 2021-05-11 MED ORDER — FENTANYL CITRATE (PF) 250 MCG/5ML IJ SOLN
INTRAMUSCULAR | Status: DC | PRN
  Administered 2021-05-11 (×2): 50 ug via INTRAVENOUS

## 2021-05-11 NOTE — Discharge Instructions (Addendum)
It was a pleasure taking care of you today. The foreign body was removed from your lung. Please follow-up with your PCP within the next few days for further evaluation. Return to the ER for new or worsening symptoms.   Flexible Bronchoscopy, Care After This sheet gives you information about how to care for yourself after your test. Your doctor may also give you more specific instructions. If you have problems or questions, contact your doctor. Follow these instructions at home: Eating and drinking Do not eat or drink anything (not even water) for 2 hours after your test, or until your numbing medicine (local anesthetic) wears off. When your numbness is gone and your cough and gag reflexes have come back, you may: Eat only soft foods. Slowly drink liquids. The day after the test, go back to your normal diet. Driving Do not drive for 24 hours if you were given a medicine to help you relax (sedative). Do not drive or use heavy machinery while taking prescription pain medicine. General instructions  Take over-the-counter and prescription medicines only as told by your doctor. Return to your normal activities as told. Ask what activities are safe for you. Do not use any products that have nicotine or tobacco in them. This includes cigarettes and e-cigarettes. If you need help quitting, ask your doctor. Keep all follow-up visits as told by your doctor. This is important. It is very important if you had a tissue sample (biopsy) taken. Get help right away if: You have shortness of breath that gets worse. You get light-headed. You feel like you are going to pass out (faint). You have chest pain. You cough up: More than a little blood. More blood than before. Summary Do not eat or drink anything (not even water) for 2 hours after your test, or until your numbing medicine wears off. Do not use cigarettes. Do not use e-cigarettes. Get help right away if you have chest pain.  This information is  not intended to replace advice given to you by your health care provider. Make sure you discuss any questions you have with your health care provider. Document Released: 03/30/2009 Document Revised: 05/15/2017 Document Reviewed: 06/20/2016 Elsevier Patient Education  2020 [employer].

## 2021-05-11 NOTE — ED Triage Notes (Addendum)
Pt states she had a chest CT on 11/17 that showed a foreign body that was part of her trach.  Reports she woke up with SOB and chest pain this morning that is new and she believes the foreign body has shifted.   CT results---"There is an obstructing foreign body at the origin of the left mainstem bronchus, with total postobstructive atelectasis of the left lower lobe. 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

## 2021-05-11 NOTE — ED Notes (Signed)
Assumed care of pt at this time.  Pt in HW20

## 2021-05-11 NOTE — H&P (View-Only) (Signed)
 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    Patient seen by her ENT, Dr. Rosen with CT scan on 11/17 showing obstructing foreign body in the left mainstem bronchus with total post-obstructive atelectasis of lower lung.  She was set up to see pulmonologist this upcoming Friday for removal but became symptomatic since.  Patient reports frequent coughing spells, denies productive sputum or fever, but believes it has moved given worsening symptoms.  Reports prior foreign bodies aspirated before but she has been able to cough and clear those.  Believes foreign body to be part of her speech valve she uses.  Difficult to understand patient but some question if this has been there since April or had a prior aspiration event in April.  Chart reviewed, no reports on CXR in August of abnormal object or changes on left lung.  Abnormal changes first noted on 11/15 CXR prompting CT.  Pulmonary consulted for evaluation of bronchoscopy.  Patient's last PO intake was around 1am today.  She is only on ASA.    Pertinent  Medical History   Past Medical History:  Diagnosis Date   Anemia    h/o of   Anxiety    Arthritis    in knees   Asthma    Breast cancer (HCC)    Bronchitis    COPD (chronic obstructive pulmonary disease) (HCC)    Diverticulosis 11/15/2012   noted on screening colonoscopy    Dyspnea    with COPD exerbation   Esophageal stricture    Former smoker 03/19/2011   GERD (gastroesophageal reflux disease)    Heart murmur    asymptomatic    History of laryngectomy    History of radiation therapy 11/15/18- 12/06/18   Right Breast  total dose 42.56 Gy in 16 fractions.    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   Leukocytopenia    Nausea alone 07/28/2013   Neck pain 01/21/2012   Normal MRI 07/14/2011   negative for mestasis    Pneumonia 2012   Sciatica    Seizures (HCC)    07/24/11 off Effexor w/o seizure   Sepsis (HCC) 08/04/2012   Sinusitis, chronic 07/20/2011   Bilateral maxillary, identified on MRI of head 07/14/11.     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HCC)    Significant Hospital Events: Including procedures, antibiotic start and stop dates in addition to other pertinent events   11/26 ER pulmonary consult  Interim History / Subjective:   Objective   Blood pressure (!) 172/92, pulse 93, temperature 97.7 F (36.5 C), resp. rate 20, SpO2 92 %.       No intake or output data in the 24 hours ending 05/11/21 0956 There were no vitals filed for this visit.  Examination: General:  Well nourished and pleasant adult female sitting upright in ER stretcher in NAD HEENT: MM pink/moist, laryngectomy present, patient is able to make some phonation with   tongue Neuro:  Alert/ appropriate, MAE  CV: rr  PULM:  non labored, lungs sounds clear, slight diminished in bases Extremities: warm/dry, no LE edema, some tenderness in LLE- recent knee replacement in October Skin: no rashes  Resolved Hospital Problem list    Assessment & Plan:   Obstructing foreign body in left mainstem bronchus with total post-obstructive atelectasis of lower lung Laryngectomy 2/2 prior laryngeal cancer  - remains on room air and no acute distress - last ate around 1am.  Now NPO.  Plans to take patient this afternoon for bronchoscopy for foreign body retrieval with Dr. Katalyn Matin.  Time TBD.  Sedation per  anesthesia.   - abx to be determined depending on bronch findings.  Currently WBC 5.8 and afebrile.   - patient will likely be able to be discharged home from ER post procedure and not require admission/ observation, but will depend on procedure course and findings.   Discussed with Caroline, PA in ER.    Labs   CBC: Recent Labs  Lab 05/11/21 0907  WBC 5.8  NEUTROABS 3.0  HGB 12.0  HCT 37.4  MCV 93.7  PLT 395    Basic Metabolic Panel: No results for input(s): NA, K, CL, CO2, GLUCOSE, BUN, CREATININE, CALCIUM, MG, PHOS in the last 168 hours. GFR: CrCl cannot be calculated (Patient's most recent lab result is older than the maximum 21 days allowed.). Recent Labs  Lab 05/11/21 0907  WBC 5.8    Liver Function Tests: No results for input(s): AST, ALT, ALKPHOS, BILITOT, PROT, ALBUMIN in the last 168 hours. No results for input(s): LIPASE, AMYLASE in the last 168 hours. No results for input(s): AMMONIA in the last 168 hours.  ABG    Component Value Date/Time   PHART 7.445 08/02/2012 1959   PCO2ART 39.8 08/02/2012 1959   PO2ART 94.5 08/02/2012 1959   HCO3 27.0 (H) 08/02/2012 1959   TCO2 24.5 08/02/2012 1959   O2SAT 97.4 08/02/2012 1959     Coagulation Profile: No results for input(s): INR, PROTIME in the last 168 hours.  Cardiac Enzymes: No results for input(s): CKTOTAL, CKMB, CKMBINDEX, TROPONINI in the last 168 hours.  HbA1C: Hemoglobin A1C  Date/Time Value Ref Range Status  12/11/2020 12:00 PM 5.5 4.0 - 5.6 % Final    CBG: No results for input(s): GLUCAP in the last 168 hours.  Review of Systems:   Review of Systems  Constitutional:  Negative for chills and fever.  Respiratory:  Positive for cough and shortness of breath. Negative for hemoptysis, sputum production and wheezing.   Cardiovascular:  Positive for chest pain.   Past Medical History:  She,  has a past medical history of Anemia, Anxiety, Arthritis, Asthma, Breast cancer (HCC), Bronchitis, COPD  (chronic obstructive pulmonary disease) (HCC), Diverticulosis (11/15/2012), Dyspnea, Esophageal stricture, Former smoker (03/19/2011), GERD (gastroesophageal reflux disease), Heart murmur, History of laryngectomy, History of radiation therapy (11/15/18- 12/06/18), radiation therapy (09/03/10 to 10/16/2010), Hypertension, Hypothyroid, Internal hemorrhoid (11/15/2012), Larynx cancer (HCC) (07/31/2010), Leukocytopenia, Nausea alone (07/28/2013), Neck pain (01/21/2012), Normal MRI (07/14/2011), Pneumonia (2012), Sciatica, Seizures (HCC), Sepsis (HCC) (08/04/2012), Sinusitis, chronic (07/20/2011), and 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HCC).   Surgical History:   Past Surgical History:  Procedure Laterality Date   BREAST LUMPECTOMY Right 07/06/2018   BREAST LUMPECTOMY WITH RADIOACTIVE SEED LOCALIZATION Right 07/06/2018   Procedure: RIGHT BREAST LUMPECTOMY WITH RADIOACTIVE SEED LOCALIZATION;  Surgeon: Wakefield, Matthew, MD;  Location: [facility];  Service: General;  Laterality: Right;   COLONOSCOPY N/A 11/15/2012   Procedure: COLONOSCOPY;    Surgeon: Dora M Brodie, MD;  Location: [facility];  Service: Endoscopy;  Laterality: N/A;   DENTAL RESTORATION/EXTRACTION WITH X-RAY     ESOPHAGEAL DILATION N/A 09/09/2019   Procedure: ESOPHAGEAL DILATION;  Surgeon: Rosen, Jefry, MD;  Location: [facility];  Service: ENT;  Laterality: N/A;   ESOPHAGEAL DILATION N/A 10/05/2019   Procedure: ESOPHAGEAL DILATION;  Surgeon: Rosen, Jefry, MD;  Location: [facility];  Service: ENT;  Laterality: N/A;  via tracheostomy   ESOPHAGEAL DILATION N/A 02/27/2020   Procedure: ESOPHAGEAL DILATION;  Surgeon: Rosen, Jefry, MD;  Location: [facility];  Service: ENT;  Laterality: N/A;   ESOPHAGOGASTRODUODENOSCOPY (EGD) WITH PROPOFOL N/A 08/12/2018   Procedure: ESOPHAGOGASTRODUODENOSCOPY (EGD) WITH PROPOFOL;  Surgeon: Danis, Henry L III, MD;  Location: [facility];  Service: Gastroenterology;  Laterality: N/A;    ESOPHAGOSCOPY  06/21/2012   Procedure: ESOPHAGOSCOPY;  Surgeon: Jefry Rosen, MD;  Location: [facility];  Service: ENT;  Laterality: N/A;   ESOPHAGOSCOPY WITH DILITATION N/A 09/21/2014   Procedure: ESOPHAGOSCOPY WITH DILITATION;  Surgeon: Jefry Rosen, MD;  [facility];  Service: ENT;  Laterality: N/A;   ESOPHAGOSCOPY WITH DILITATION N/A 07/04/2016   Procedure: ESOPHAGOSCOPY WITH DILITATION;  Surgeon: Jefry Rosen, MD;  [facility]: [facility];  Service: ENT;  Laterality: N/A;   ESOPHAGOSCOPY WITH DILITATION N/A 12/01/2017   Procedure: ESOPHAGOSCOPY WITH DILITATION;  Surgeon: Rosen, Jefry, MD;  [facility];  Service: ENT;  Laterality: N/A;   ESOPHAGOSCOPY WITH DILITATION N/A 04/19/2018   Procedure: ESOPHAGOSCOPY WITH DILITATION;  Surgeon: Rosen, Jefry, MD;  [facility];  Service: ENT;  Laterality: N/A;   ESOPHAGOSCOPY WITH DILITATION N/A 03/07/2019   Procedure: Esophagoscopy with dilatation;  Surgeon: Rosen, Jefry, MD;  Location: [facility];  Service: ENT;  Laterality: N/A;   ESOPHAGOSCOPY WITH DILITATION N/A 07/16/2020   Procedure: ESOPHAGOSCOPY WITH DILATION;  Surgeon: Rosen, Jefry, MD;  Location: [facility];  Service: ENT;  Laterality: N/A;   ESOPHAGOSCOPY WITH DILITATION N/A 10/08/2020   Procedure: ESOPHAGOSCOPY With Dilation;  Surgeon: Rosen, Jefry, MD;  Location: [facility];  Service: ENT;  Laterality: N/A;  21 French to 51 french   FLEXIBLE BRONCHOSCOPY  01/08/2018       FOREIGN BODY REMOVAL BRONCHIAL  10/02/2011   Procedure: REMOVAL FOREIGN BODY BRONCHIAL;  Surgeon: Mitchell Gore, MD;  [facility];  Service: ENT;  Laterality: N/A;   FOREIGN BODY REMOVAL BRONCHIAL N/A 01/08/2018   Procedure: REMOVAL FOREIGN BODY BRONCHIAL;  Surgeon: Wolicki, Karol, MD;  Location: [facility];  Service: ENT;  Laterality: N/A;   LARYNGECTOMY     Porta cath removal     PORTACATH PLACEMENT  09/17/10   Tip in cavoatrial junction   RE-EXCISION OF BREAST  CANCER,SUPERIOR MARGINS Right 07/29/2018   Procedure: RE-EXCISION OF RIGHT BREAST MEDIAL MARGINS;  Surgeon: Wakefield, Matthew, MD;  [facility]: [facility];  Service: General;  Laterality: Right;   RIGID ESOPHAGOSCOPY N/A 03/19/2019   Procedure: FLEXIBLE ESOPHAGOSCOPY;  Surgeon: Wolicki, Karol, MD;  [facility];  Service: ENT;  Laterality: N/A;   STOMAPLASTY N/A 10/21/2016   Procedure: STOMAPLASTY;  Surgeon: Rosen, Jefry, MD;  [facility];  Service: ENT;  Laterality: N/A;   TOTAL KNEE ARTHROPLASTY Right 12/12/2019   Procedure: RIGHT TOTAL KNEE ARTHROPLASTY;  Surgeon: Xu, Naiping M, MD;  Location: [facility];  Service: Orthopedics;  Laterality: Right;   TOTAL KNEE ARTHROPLASTY Left 04/01/2021   Procedure: LEFT TOTAL KNEE ARTHROPLASTY;    Surgeon: Xu, Naiping M, MD;  Location: [facility];  Service: Orthopedics;  Laterality: Left;   TRACHEAL DILITATION  07/16/2011   Procedure: TRACHEAL DILITATION;  Surgeon: Jefry H Rosen, MD;  Location: [facility];  Service: ENT;  Laterality: N/A;  dilation of tracheal stoma and replacement of stoma tube   TUBAL LIGATION  1982     Social History:   reports that she quit smoking about 10 years ago. Her smoking use included cigarettes. She has a 40.00 pack-year smoking history. She has never used smokeless tobacco. She reports current alcohol use. She reports that she does not use drugs.   Family History:  Her family history includes Cancer in her brother and sister; Diabetes in her sister; Heart disease in her father, mother, and sister; Liver disease in her maternal grandmother. There is no history of Breast cancer.   Allergies Allergies  Allergen Reactions   Lisinopril Cough     Home Medications  Prior to Admission medications   Medication Sig Start Date End Date Taking? Authorizing Provider  albuterol (PROAIR HFA) 108 (90 Base) MCG/ACT inhaler Inhale 2 puffs into the lungs every 4 (four) hours as needed for wheezing or shortness of breath. 12/11/20   Olson, Daniel K, MD   albuterol (PROVENTIL) (5 MG/ML) 0.5% nebulizer solution Take 0.5 mLs (2.5 mg total) by nebulization every 6 (six) hours as needed for wheezing or shortness of breath. Please give the patient 10 20ml vials. 12/11/20   Olson, Daniel K, MD  amLODipine (NORVASC) 10 MG tablet Take 1 tablet by mouth daily Patient taking differently: Take 10 mg by mouth daily. 12/11/20   Olson, Daniel K, MD  anastrozole (ARIMIDEX) 1 MG tablet Take 1 tablet (1 mg total) by mouth daily. 02/15/21   Gudena, Vinay, MD  aspirin EC 81 MG tablet Take 1 tablet (81 mg total) by mouth 2 (two) times daily. To be taken after surgery 01/21/21   Stanbery, Cortana L, PA-C  celecoxib (CELEBREX) 200 MG capsule Take 1 capsule (200 mg total) by mouth 2 (two) times daily. 04/17/21   Xu, Naiping M, MD  cetirizine (ZYRTEC) 10 MG tablet Take 1 tablet (10 mg total) by mouth daily. 12/20/20   Sun, Richard, MD  docusate sodium (COLACE) 100 MG capsule Take 1 capsule (100 mg total) by mouth daily as needed. 01/21/21 01/21/22  Stanbery, Zineb L, PA-C  escitalopram (LEXAPRO) 10 MG tablet Take 1 tablet (10 mg total) by mouth daily. 12/25/20   Sun, Richard, MD  Humidifiers MISC 1 application by Tracheal Tube route as needed. 02/05/21   Sun, Richard, MD  HYDROcodone-acetaminophen (NORCO) 5-325 MG tablet Take 1 tablet by mouth 2 (two) times daily as needed. 04/22/21   Stanbery, Keyuana L, PA-C  ipratropium-albuterol (DUONEB) 0.5-2.5 (3) MG/3ML SOLN Take 3 mLs by nebulization every 4 (four) hours as needed. 07/19/20   Brown, Carina M, MD  levothyroxine (SYNTHROID) 200 MCG tablet Take 1 tablet (200 mcg total) by mouth daily before breakfast. 12/18/20   Hensel, William A, MD  losartan-hydrochlorothiazide (HYZAAR) 100-12.5 MG tablet Take 1 tablet by mouth daily. 12/25/20   Sun, Richard, MD  methocarbamol (ROBAXIN) 500 MG tablet Take 1 tablet (500 mg total) by mouth 2 (two) times daily as needed. To be taken after surgery 01/21/21   Stanbery, Sirena L, PA-C  metoprolol tartrate (LOPRESSOR) 50  MG tablet Take 1 tablet (50 mg total) by mouth 2 (two) times daily. 12/20/20   Sun, Richard, MD  mometasone-formoterol (DULERA) 200-5 MCG/ACT AERO Inhale   2 puffs into the lungs 2 (two) times daily. 12/11/20   Olson, Daniel K, MD  nystatin cream (MYCOSTATIN) APPLY  CREAM TOPICALLY TWICE DAILY AS NEEDED Patient taking differently: Apply 1 application topically daily as needed for dry skin. 09/20/19   Olson, Daniel K, MD  omeprazole (PRILOSEC) 40 MG capsule Take 1 capsule (40 mg total) by mouth 2 (two) times daily. 12/25/20   Sun, Richard, MD  ondansetron (ZOFRAN) 4 MG tablet Take 1 tablet (4 mg total) by mouth every 8 (eight) hours as needed for nausea or vomiting. 01/21/21   Stanbery, Buffey L, PA-C  oxyCODONE-acetaminophen (PERCOCET) 5-325 MG tablet Take 1-2 tablets by mouth every 6 (six) hours as needed. To be taken after surgery 01/21/21   Stanbery, Genine L, PA-C  oxyCODONE-acetaminophen (PERCOCET) 5-325 MG tablet Take 1-2 tablets by mouth 2 (two) times daily as needed for severe pain. 04/17/21   Xu, Naiping M, MD  Tiotropium Bromide Monohydrate (SPIRIVA RESPIMAT) 2.5 MCG/ACT AERS Inhale 2 puffs into the lungs daily. 04/16/21   Sun, Richard, MD     Critical care time: n/a       Brooke Simpson, ACNP [facility] 05/11/2021, 9:56 AM  See Amion for pager If no response to pager, please call PCCM consult pager After 7:00 pm call Elink     PCCM:  65 yo FM, h/o laryngectomy, found to have a LLL obstructing foreign body presents to the ED with increasing cough and SOB.   BP (!) 151/68   Pulse 86   Temp 97.7 F (36.5 C)   Resp 19   SpO2 93%   Gen: obese FM HENT: ostomy on neck  Heart: RRR, s1 s2  Lungs: CTAB, diminshed in bases BL  Abd: Soft, obese   Labs reviewed  CT imaging: LLL FB  The patient's images have been independently reviewed by me.    A:  LLL obstructing FB   P: Plans for bronchoscopy today with extraction  Discussed with patient  NPO  Orders  placed  Mckade Gurka L Caillou Minus, DO  Pulmonary Critical Care 05/11/2021 11:56 AM    

## 2021-05-11 NOTE — Interval H&P Note (Signed)
History and Physical Interval Note:  05/11/2021 2:05 PM  Deborah [name]  has presented today for surgery, with the diagnosis of foreign body.  The various methods of treatment have been discussed with the patient and family. After consideration of risks, benefits and other options for treatment, the patient has consented to  Procedure(s) with comments: VIDEO BRONCHOSCOPY WITHOUT FLUORO (Bilateral) - w/ cryotherapy, Foreign body extraction as a surgical intervention.  The patient's history has been reviewed, patient examined, no change in status, stable for surgery.  I have reviewed the patient's chart and labs.  Questions were answered to the patient's satisfaction.     [place]

## 2021-05-11 NOTE — Consult Note (Addendum)
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    Patient seen by her ENT, [name] with CT scan on 11/17 showing obstructing foreign body in the left mainstem bronchus with total post-obstructive atelectasis of lower lung.  She was set up to see pulmonologist this upcoming Friday for removal but became symptomatic since.  Patient reports frequent coughing spells, denies productive sputum or fever, but believes it has moved given worsening symptoms.  Reports prior foreign bodies aspirated before but she has been able to cough and clear those.  Believes foreign body to be part of her speech valve she uses.  Difficult to understand patient but some question if this has been there since April or had a prior aspiration event in April.  Chart reviewed, no reports on CXR in August of abnormal object or changes on left lung.  Abnormal changes first noted on 11/15 CXR prompting CT.  Pulmonary consulted for evaluation of bronchoscopy.  Patient's last PO intake was around 1am today.  She is only on ASA.    Pertinent  Medical History   Past Medical History:  Diagnosis Date   Anemia    h/o of   Anxiety    Arthritis    in knees   Asthma    Breast cancer ([place])    Bronchitis    COPD (chronic obstructive pulmonary disease) ([place])    Diverticulosis 11/15/2012   noted on screening colonoscopy    Dyspnea    with COPD exerbation   Esophageal stricture    Former smoker 03/19/2011   GERD (gastroesophageal reflux disease)    Heart murmur    asymptomatic    History of laryngectomy    History of radiation therapy 11/15/18- 12/06/18   Right Breast  total dose 42.56 Gy in 16 fractions.    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   Leukocytopenia    Nausea alone 07/28/2013   Neck pain 01/21/2012   Normal MRI 07/14/2011   negative for mestasis    Pneumonia 2012   Sciatica    Seizures ([place])    07/24/11 off Effexor w/o seizure   Sepsis ([place]) 08/04/2012   Sinusitis, chronic 07/20/2011   Bilateral maxillary, identified on MRI of head 07/14/11.     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[place])    Significant Hospital Events: Including procedures, antibiotic start and stop dates in addition to other pertinent events   11/26 ER pulmonary consult  Interim History / Subjective:   Objective   Blood pressure (!) 172/92, pulse 93, temperature 97.7 F (36.5 C), resp. rate 20, SpO2 92 %.       No intake or output data in the 24 hours ending 05/11/21 0956 There were no vitals filed for this visit.  Examination: General:  Well nourished and pleasant adult female sitting upright in ER stretcher in NAD HEENT: MM pink/moist, laryngectomy present, patient is able to make some phonation with  tongue Neuro:  Alert/ appropriate, MAE  CV: rr  PULM:  non labored, lungs sounds clear, slight diminished in bases Extremities: warm/dry, no LE edema, some tenderness in LLE- recent knee replacement in October Skin: no rashes  Resolved Hospital Problem list    Assessment & Plan:   Obstructing foreign body in left mainstem bronchus with total post-obstructive atelectasis of lower lung Laryngectomy 2/2 prior laryngeal cancer  - remains on room air and no acute distress - last ate around 1am.  Now NPO.  Plans to take patient this afternoon for bronchoscopy for foreign body retrieval with Dr. Valeta Harms.  Time TBD.  Sedation per  anesthesia.   - abx to be determined depending on bronch findings.  Currently WBC 5.8 and afebrile.   - patient will likely be able to be discharged home from ER post procedure and not require admission/ observation, but will depend on procedure course and findings.   Discussed with Chrys Racer, [place] in ER.    Labs   CBC: Recent Labs  Lab 05/11/21 0907  WBC 5.8  NEUTROABS 3.0  HGB 12.0  HCT 37.4  MCV 93.7  PLT 381    Basic Metabolic Panel: No results for input(s): NA, K, CL, CO2, GLUCOSE, BUN, CREATININE, CALCIUM, MG, PHOS in the last 168 hours. GFR: CrCl cannot be calculated (Patient's most recent lab result is older than the maximum 21 days allowed.). Recent Labs  Lab 05/11/21 0907  WBC 5.8    Liver Function Tests: No results for input(s): AST, ALT, ALKPHOS, BILITOT, PROT, ALBUMIN in the last 168 hours. No results for input(s): LIPASE, AMYLASE in the last 168 hours. No results for input(s): AMMONIA in the last 168 hours.  ABG    Component Value Date/Time   PHART 7.445 08/02/2012 1959   PCO2ART 39.8 08/02/2012 1959   PO2ART 94.5 08/02/2012 1959   HCO3 27.0 (H) 08/02/2012 1959   TCO2 24.5 08/02/2012 1959   O2SAT 97.4 08/02/2012 1959     Coagulation Profile: No results for input(s): INR, PROTIME in the last 168 hours.  Cardiac Enzymes: No results for input(s): CKTOTAL, CKMB, CKMBINDEX, TROPONINI in the last 168 hours.  HbA1C: Hemoglobin A1C  Date/Time Value Ref Range Status  12/11/2020 12:00 PM 5.5 4.0 - 5.6 % Final    CBG: No results for input(s): GLUCAP in the last 168 hours.  Review of Systems:   Review of Systems  Constitutional:  Negative for chills and fever.  Respiratory:  Positive for cough and shortness of breath. Negative for hemoptysis, sputum production and wheezing.   Cardiovascular:  Positive for chest pain.   Past Medical History:  She,  has a past medical history of Anemia, Anxiety, Arthritis, Asthma, Breast cancer ([place]), Bronchitis, COPD  (chronic obstructive pulmonary disease) ([place]), Diverticulosis (11/15/2012), Dyspnea, Esophageal stricture, Former smoker (03/19/2011), GERD (gastroesophageal reflux disease), Heart murmur, History of laryngectomy, History of radiation therapy (11/15/18- 12/06/18), radiation therapy (09/03/10 to 10/16/2010), Hypertension, Hypothyroid, Internal hemorrhoid (11/15/2012), Larynx cancer ([place]) (07/31/2010), Leukocytopenia, Nausea alone (07/28/2013), Neck pain (01/21/2012), Normal MRI (07/14/2011), Pneumonia (2012), Sciatica, Seizures ([place]), Sepsis ([place]) (08/04/2012), Sinusitis, chronic (07/20/2011), and 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[place]).   Surgical History:   Past Surgical History:  Procedure Laterality Date   BREAST LUMPECTOMY Right 07/06/2018   BREAST LUMPECTOMY WITH RADIOACTIVE SEED LOCALIZATION Right 07/06/2018   Procedure: RIGHT BREAST LUMPECTOMY WITH RADIOACTIVE SEED LOCALIZATION;  Surgeon: Rolm Bookbinder, MD;  Location: [facility];  Service: General;  Laterality: Right;   COLONOSCOPY N/A 11/15/2012   Procedure: COLONOSCOPY;  Surgeon: Lafayette Dragon, MD;  Location[facility];  Service: Endoscopy;  Laterality: N/A;   DENTAL RESTORATION/EXTRACTION WITH X-RAY     ESOPHAGEAL DILATION N/A 09/09/2019   Procedure: ESOPHAGEAL DILATION;  Surgeon: [name], MD;  Location: [facility];  Service: ENT;  Laterality: N/A;   ESOPHAGEAL DILATION N/A 10/05/2019   Procedure: ESOPHAGEAL DILATION;  Surgeon: [name], MD;  Location: [facility];  Service: ENT;  Laterality: N/A;  via tracheostomy   ESOPHAGEAL DILATION N/A 02/27/2020   Procedure: ESOPHAGEAL DILATION;  Surgeon: [name], MD;  Location: [facility];  Service: ENT;  Laterality: N/A;   ESOPHAGOGASTRODUODENOSCOPY (EGD) WITH PROPOFOL N/A 08/12/2018   Procedure: ESOPHAGOGASTRODUODENOSCOPY (EGD) WITH PROPOFOL;  Surgeon: Doran Stabler, MD;  Location: [facility];  Service: Gastroenterology;  Laterality: N/A;    ESOPHAGOSCOPY  06/21/2012   Procedure: ESOPHAGOSCOPY;  Surgeon: [name], MD;  Location: [facility];  Service: ENT;  Laterality: N/A;   ESOPHAGOSCOPY WITH DILITATION N/A 09/21/2014   Procedure: ESOPHAGOSCOPY WITH DILITATION;  Surgeon: [name], MD;  Location: [facility];  Service: ENT;  Laterality: N/A;   ESOPHAGOSCOPY WITH DILITATION N/A 07/04/2016   Procedure: ESOPHAGOSCOPY WITH DILITATION;  Surgeon: [name], MD;  Location: [facility];  Service: ENT;  Laterality: N/A;   ESOPHAGOSCOPY WITH DILITATION N/A 12/01/2017   Procedure: ESOPHAGOSCOPY WITH DILITATION;  Surgeon: [name], MD;  Location: [facility];  Service: ENT;  Laterality: N/A;   ESOPHAGOSCOPY WITH DILITATION N/A 04/19/2018   Procedure: ESOPHAGOSCOPY WITH DILITATION;  Surgeon: [name], MD;  Location: [facility];  Service: ENT;  Laterality: N/A;   ESOPHAGOSCOPY WITH DILITATION N/A 03/07/2019   Procedure: Esophagoscopy with dilatation;  Surgeon: [name], MD;  Location: [facility];  Service: ENT;  Laterality: N/A;   ESOPHAGOSCOPY WITH DILITATION N/A 07/16/2020   Procedure: ESOPHAGOSCOPY WITH DILATION;  Surgeon: [name], MD;  Location: [facility];  Service: ENT;  Laterality: N/A;   [place] N/A 10/08/2020   Procedure: ESOPHAGOSCOPY With Dilation;  Surgeon: [name], MD;  Location: [facility];  Service: ENT;  Laterality: N/A;  [address] [place] to [place]  01/08/2018       FOREIGN BODY REMOVAL BRONCHIAL  10/02/2011   Procedure: REMOVAL FOREIGN BODY BRONCHIAL;  Surgeon: Ruby Cola, MD;  Location: [facility];  Service: ENT;  Laterality: N/A;   FOREIGN BODY REMOVAL BRONCHIAL N/A 01/08/2018   Procedure: REMOVAL FOREIGN BODY BRONCHIAL;  Surgeon: Jodi Marble, MD;  Location: [facility];  Service: ENT;  Laterality: N/A;   LARYNGECTOMY     Porta cath removal     PORTACATH PLACEMENT  09/17/10   Tip in cavoatrial junction   RE-EXCISION OF BREAST  CANCER,SUPERIOR MARGINS Right 07/29/2018   Procedure: RE-EXCISION OF RIGHT BREAST MEDIAL MARGINS;  Surgeon: Rolm Bookbinder, MD;  Location: [facility];  Service: General;  Laterality: Right;   RIGID ESOPHAGOSCOPY N/A 03/19/2019   Procedure: FLEXIBLE ESOPHAGOSCOPY;  Surgeon: Jodi Marble, MD;  Location: [facility];  Service: ENT;  Laterality: N/A;   STOMAPLASTY N/A 10/21/2016   Procedure: Zola Button;  Surgeon: [name], MD;  Location: [facility];  Service: ENT;  Laterality: N/A;   TOTAL KNEE ARTHROPLASTY Right 12/12/2019   Procedure: RIGHT TOTAL KNEE ARTHROPLASTY;  Surgeon: Leandrew Koyanagi, MD;  Location: [facility];  Service: Orthopedics;  Laterality: Right;   TOTAL KNEE ARTHROPLASTY Left 04/01/2021   Procedure: LEFT TOTAL KNEE ARTHROPLASTY;  Surgeon: Leandrew Koyanagi, MD;  Location: [facility];  Service: Orthopedics;  Laterality: Left;   TRACHEAL DILITATION  07/16/2011   Procedure: TRACHEAL DILITATION;  Surgeon: Beckie Salts, MD;  Location: [facility];  Service: ENT;  Laterality: N/A;  dilation of tracheal stoma and replacement of stoma tube   TUBAL LIGATION  1982     Social History:   reports that she quit smoking about 10 years ago. Her smoking use included cigarettes. She has a 40.00 pack-year smoking history. She has never used smokeless tobacco. She reports current alcohol use. She reports that she does not use drugs.   Family History:  Her family history includes Cancer in her brother and sister; Diabetes in her sister; Heart disease in her father, mother, and sister; Liver disease in her maternal grandmother. There is no history of Breast cancer.   Allergies Allergies  Allergen Reactions   Lisinopril Cough     Home Medications  Prior to Admission medications   Medication Sig Start Date End Date Taking? Authorizing Provider  albuterol (PROAIR HFA) 108 (90 Base) MCG/ACT inhaler Inhale 2 puffs into the lungs every 4 (four) hours as needed for wheezing or shortness of breath. 12/11/20   Benay Pike, MD   albuterol (PROVENTIL) (5 MG/ML) 0.5% nebulizer solution Take 0.5 mLs (2.5 mg total) by nebulization every 6 (six) hours as needed for wheezing or shortness of breath. Please give the patient 10 71m vials. 12/11/20   OBenay Pike MD  amLODipine (NORVASC) 10 MG tablet Take 1 tablet by mouth daily Patient taking differently: Take 10 mg by mouth daily. 12/11/20   OBenay Pike MD  anastrozole (ARIMIDEX) 1 MG tablet Take 1 tablet (1 mg total) by mouth daily. 02/15/21   GNicholas Lose MD  aspirin EC 81 MG tablet Take 1 tablet (81 mg total) by mouth 2 (two) times daily. To be taken after surgery 01/21/21   SAundra Dubin PA-C  celecoxib (CELEBREX) 200 MG capsule Take 1 capsule (200 mg total) by mouth 2 (two) times daily. 04/17/21   XLeandrew Koyanagi MD  cetirizine (ZYRTEC) 10 MG tablet Take 1 tablet (10 mg total) by mouth daily. 12/20/20   SZola Button MD  docusate sodium (COLACE) 100 MG capsule Take 1 capsule (100 mg total) by mouth daily as needed. 01/21/21 01/21/22  SAundra Dubin PA-C  escitalopram (LEXAPRO) 10 MG tablet Take 1 tablet (10 mg total) by mouth daily. 12/25/20   SZola Button MD  Humidifiers MISC 1 application by Tracheal Tube route as needed. 02/05/21   SZola Button MD  HYDROcodone-acetaminophen (NORCO) 5-325 MG tablet Take 1 tablet by mouth 2 (two) times daily as needed. 04/22/21   SAundra Dubin PA-C  ipratropium-albuterol (DUONEB) 0.5-2.5 (3) MG/3ML SOLN Take 3 mLs by nebulization every 4 (four) hours as needed. 07/19/20   BMartyn Malay MD  levothyroxine (SYNTHROID) 200 MCG tablet Take 1 tablet (200 mcg total) by mouth daily before breakfast. 12/18/20   Hensel, WJamal Collin MD  losartan-hydrochlorothiazide (HYZAAR) 100-12.5 MG tablet Take 1 tablet by mouth daily. 12/25/20   SZola Button MD  methocarbamol (ROBAXIN) 500 MG tablet Take 1 tablet (500 mg total) by mouth 2 (two) times daily as needed. To be taken after surgery 01/21/21   SAundra Dubin PA-C  metoprolol tartrate (LOPRESSOR) 50  MG tablet Take 1 tablet (50 mg total) by mouth 2 (two) times daily. 12/20/20   SZola Button MD  mometasone-formoterol ([facility] 200-5 MCG/ACT AERO Inhale  2 puffs into the lungs 2 (two) times daily. 12/11/20   Benay Pike, MD  nystatin cream (MYCOSTATIN) APPLY  CREAM TOPICALLY TWICE DAILY AS NEEDED Patient taking differently: Apply 1 application topically daily as needed for dry skin. 09/20/19   Benay Pike, MD  omeprazole (PRILOSEC) 40 MG capsule Take 1 capsule (40 mg total) by mouth 2 (two) times daily. 12/25/20   Zola Button, MD  ondansetron (ZOFRAN) 4 MG tablet Take 1 tablet (4 mg total) by mouth every 8 (eight) hours as needed for nausea or vomiting. 01/21/21   Aundra Dubin, PA-C  oxyCODONE-acetaminophen (PERCOCET) 5-325 MG tablet Take 1-2 tablets by mouth every 6 (six) hours as needed. To be taken after surgery 01/21/21   Aundra Dubin, PA-C  oxyCODONE-acetaminophen (PERCOCET) 5-325 MG tablet Take 1-2 tablets by mouth 2 (two) times daily as needed for severe pain. 04/17/21   Leandrew Koyanagi, MD  Tiotropium Bromide Monohydrate (SPIRIVA RESPIMAT) 2.5 MCG/ACT AERS Inhale 2 puffs into the lungs daily. 04/16/21   Zola Button, MD     Critical care time: n/a       Kennieth Rad, ACNP [facility] 05/11/2021, 9:56 AM  See Shea Evans for pager If no response to pager, please call PCCM consult pager After 7:00 pm call [place]     PCCM:  65 yo FM, h/o laryngectomy, found to have a LLL obstructing foreign body presents to the ED with increasing cough and SOB.   BP (!) 151/68   Pulse 86   Temp 97.7 F (36.5 C)   Resp 19   SpO2 93%   Gen: obese FM HENT: ostomy on neck  Heart: RRR, s1 s2  Lungs: CTAB, diminshed in bases BL  Abd: Soft, obese   Labs reviewed  CT imaging: LLL FB  The patient's images have been independently reviewed by me.    A:  LLL obstructing FB   P: Plans for bronchoscopy today with extraction  Discussed with patient  NPO  Orders  placed  Garner Nash, DO [facility] 05/11/2021 11:56 AM

## 2021-05-11 NOTE — Progress Notes (Signed)
PCCM:  Patient seen in ED.  Full consult note to follow.  Patient has not eaten or drank since 1AM. S/p laryngectomy  LLL foreign body on CT imaging  Plans for bronchoscopy with extraction this afternoon  Keep NPO, orders placed   Garner Nash, DO [facility] 05/11/2021 10:01 AM

## 2021-05-11 NOTE — Anesthesia Postprocedure Evaluation (Signed)
Anesthesia Post Note  Patient: Grace Blight Winski  Procedure(s) Performed: VIDEO BRONCHOSCOPY WITHOUT FLUORO (Bilateral) FOREIGN BODY REMOVAL HEMOSTASIS CONTROL CRYOTHERAPY     Patient location during evaluation: PACU Anesthesia Type: General Level of consciousness: awake and alert Pain management: pain level controlled Vital Signs Assessment: post-procedure vital signs reviewed and stable Respiratory status: spontaneous breathing, nonlabored ventilation and respiratory function stable Cardiovascular status: blood pressure returned to baseline and stable Postop Assessment: no apparent nausea or vomiting Anesthetic complications: no   No notable events documented.  Last Vitals:  Vitals:   05/11/21 1602 05/11/21 1713  BP: (!) 203/98 (!) 200/92  Pulse: 87 88  Resp: 16 18  Temp:    SpO2: 98% 98%    Last Pain:  Vitals:   05/11/21 1713  TempSrc:   PainSc: 0-No pain                 Daniyal Tabor,W. EDMOND

## 2021-05-11 NOTE — Transfer of Care (Signed)
Immediate Anesthesia Transfer of Care Note  Patient: Deborah [name]  Procedure(s) Performed: VIDEO BRONCHOSCOPY WITHOUT FLUORO (Bilateral) FOREIGN BODY REMOVAL HEMOSTASIS CONTROL CRYOTHERAPY  Patient Location: Endoscopy Unit  Anesthesia Type:General  Level of Consciousness: awake, alert  and oriented  Airway & Oxygen Therapy: Patient Spontanous Breathing  Post-op Assessment: Report given to RN and Post -op Vital signs reviewed and stable  Post vital signs: Reviewed and stable  Last Vitals:  Vitals Value Taken Time  BP 211/99 05/11/21 1555  Temp 36.6 C 05/11/21 1547  Pulse 93 05/11/21 1557  Resp 20 05/11/21 1557  SpO2 95 % 05/11/21 1557  Vitals shown include unvalidated device data.  Last Pain:  Vitals:   05/11/21 1547  TempSrc: Oral  PainSc: 0-No pain         Complications: No notable events documented.

## 2021-05-11 NOTE — ED Provider Notes (Signed)
Patient presents back from the endoscopy lab after she had a bronchoscopy with foreign body removal from her lungs.  She is doing well.  She was observed for period time in the ED.  She has no shortness of breath.  No vomiting.  She is fully alert and oriented.  She has no nausea.  No coughing.  She is ready to go home.  Her blood pressures been a little elevated and she was advised to make sure that she takes her blood pressure medicine when she gets home.  She is asymptomatic from this.  Return precautions were given.  She has instructions to follow-up with [name].   Malvin Johns, MD 05/11/21 1719

## 2021-05-11 NOTE — ED Provider Notes (Signed)
[facility] EMERGENCY DEPARTMENT Provider Note   CSN: [id number] Arrival date & time: 05/11/21  9450     History Chief Complaint  Patient presents with   Chest Pain   Shortness of Breath    Deborah [name] is a 65 y.o. 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  Patient had a CT scan performed on 11/17 by [name] her ENT doctor which demonstrated obstructing foreign body at the origin of the left mainstem bronchus with total postobstructive atelectasis of the left lower lung.  Per telephone encounters and chart review, patient has been set up with a pulmonology appointment by [name] for removal of foreign body.  Patient states her appointment is on Friday however, notes sudden onset of shortness of breath throughout the night which prompted her to report to the ED.  She notes she feels like the foreign body has shifted. She endorses some hemoptysis. Patient states she has been gagging and coughing frequently.  She has been using her nebulizer treatment with no relief.  No fever or chills.   History obtained from patient and past medical records. No interpreter used during encounter.       Past Medical History:  Diagnosis Date   Anemia    h/o of   Anxiety    Arthritis    in knees   Asthma    Breast cancer ([place])    Bronchitis    COPD (chronic obstructive pulmonary disease) ([place])    Diverticulosis 11/15/2012   noted on screening colonoscopy    Dyspnea    with COPD exerbation   Esophageal stricture    Former smoker 03/19/2011   GERD (gastroesophageal reflux disease)    Heart murmur    asymptomatic    History of laryngectomy    History of  radiation therapy 11/15/18- 12/06/18   Right Breast total dose 42.56 Gy in 16 fractions.    Hx of radiation therapy 09/03/10 to 10/16/2010   supraglottic larynx   Hypertension    Hypothyroid    due to radiation   Internal hemorrhoid 11/15/2012   small, noted on screening colonoscopy    Larynx cancer ([place]) 07/31/2010   supraglotttic s/p chemo/radiation and surgical rescection.   Leukocytopenia    Nausea alone 07/28/2013   Neck pain 01/21/2012   Normal MRI 07/14/2011   negative for mestasis    Pneumonia 2012   Sciatica    Seizures ([place])    07/24/11 off Effexor w/o seizure   Sepsis ([place]) 08/04/2012   Sinusitis, chronic 07/20/2011   Bilateral maxillary, identified on MRI of head 07/14/11.     Tracheostomy dependent [facility])     Patient Active Problem List   Diagnosis Date Noted   Status post total left knee replacement 04/01/2021   Primary osteoarthritis of left knee 03/31/2021   Peripheral polyneuropathy 12/12/2020   Status post total right knee replacement 12/12/2019   Primary osteoarthritis of right knee 12/11/2019   Localized swelling, mass and lump, neck 07/20/2019   Allergic rhinitis 05/20/2019   GERD (gastroesophageal reflux disease)    COPD (chronic obstructive pulmonary disease) (H[facility])    Chronic diastolic (congestive) heart failure ([place])    Breast cancer ([place]) 07/06/2018   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   Major depressive disorder 04/08/2018   Carpal tunnel syndrome 03/16/2018   Obesity 08/24/2017   Plantar fasciitis 06/06/2016   Low back pain 05/21/2016   Osteoarthritis of knees, bilateral 05/31/2015   Knee pain 01/04/2015   Dysphagia 08/16/2014   Weight gain 08/05/2014   DOE (dyspnea on exertion) 04/28/2014   Frequent falls 12/23/2013   Anemia in chronic illness 08/11/2012   Status post trachelectomy 08/04/2012   Chronic pain 01/20/2012   History of head and neck cancer 09/26/2011   Hx of radiation therapy    Tracheitis 07/17/2011    Seizure ([place]) 07/17/2011   Hypothyroidism 03/19/2011   Essential hypertension 03/19/2011   Larynx cancer ([place]) 07/31/2010    Past Surgical History:  Procedure Laterality Date   BREAST LUMPECTOMY Right 07/06/2018   BREAST LUMPECTOMY WITH RADIOACTIVE SEED LOCALIZATION Right 07/06/2018   Procedure: RIGHT BREAST LUMPECTOMY WITH RADIOACTIVE SEED LOCALIZATION;  Surgeon: Rolm Bookbinder, MD;  Location: [place];  Service: General;  Laterality: Right;   COLONOSCOPY N/A 11/15/2012   Procedure: COLONOSCOPY;  Surgeon: Lafayette Dragon, MD;  [facility];  Service: Endoscopy;  Laterality: N/A;   DENTAL RESTORATION/EXTRACTION WITH X-RAY     ESOPHAGEAL DILATION N/A 09/09/2019   Procedure: ESOPHAGEAL DILATION;  Surgeon: [name], MD;  Location: [facility];  Service: ENT;  Laterality: N/A;   ESOPHAGEAL DILATION N/A 10/05/2019   Procedure: ESOPHAGEAL DILATION;  Surgeon: [name], MD;  Location: [employer];  Service: ENT;  Laterality: N/A;  via tracheostomy   ESOPHAGEAL DILATION N/A 02/27/2020   Procedure: ESOPHAGEAL DILATION;  Surgeon: [name], MD;  Location: [facility];  Service: ENT;  Laterality: N/A;   ESOPHAGOGASTRODUODENOSCOPY (EGD) WITH PROPOFOL N/A 08/12/2018   Procedure: ESOPHAGOGASTRODUODENOSCOPY (EGD) WITH PROPOFOL;  Surgeon: Doran Stabler, MD;  Location: [facility];  Service: Gastroenterology;  Laterality: N/A;   ESOPHAGOSCOPY  06/21/2012   Procedure: ESOPHAGOSCOPY;  Surgeon: [name], MD;  Location: [facility];  Service: ENT;  Laterality: N/A;   ESOPHAGOSCOPY WITH DILITATION N/A 09/21/2014   Procedure: ESOPHAGOSCOPY WITH DILITATION;  Surgeon: [name], MD;  Location: [place];  Service: ENT;  Laterality: N/A;   ESOPHAGOSCOPY WITH DILITATION N/A 07/04/2016   Procedure: ESOPHAGOSCOPY WITH DILITATION;  Surgeon: [name], MD;  Location: [place];  Service: ENT;  Laterality: N/A;   ESOPHAGOSCOPY WITH DILITATION N/A  12/01/2017   Procedure: ESOPHAGOSCOPY WITH DILITATION;  Surgeon: [name], MD;  Location: [facility];  Service: ENT;  Laterality: N/A;   ESOPHAGOSCOPY WITH DILITATION N/A 04/19/2018   Procedure: ESOPHAGOSCOPY WITH DILITATION;  Surgeon: [name], MD;  Location: [facility];  Service: ENT;  Laterality: N/A;   ESOPHAGOSCOPY WITH DILITATION N/A 03/07/2019   Procedure: Esophagoscopy with dilatation;  Surgeon: [name], MD;  Location: [place];  Service: ENT;  Laterality: N/A;   ESOPHAGOSCOPY WITH DILITATION N/A 07/16/2020   Procedure: ESOPHAGOSCOPY WITH DILATION;  Surgeon: [name], MD;  Location: [facility];  Service: ENT;  Laterality: N/A;   [place] N/A 10/08/2020   Procedure: ESOPHAGOSCOPY With Dilation;  Surgeon: [name], MD;  Location: [facility];  Service: ENT;  Laterality: N/A;  21 French to 46 french   [place]  01/08/2018       FOREIGN BODY REMOVAL BRONCHIAL  10/02/2011   Procedure: REMOVAL FOREIGN BODY BRONCHIAL;  Surgeon: Ruby Cola, MD;  Location: [facility];  Service: ENT;  Laterality: N/A;  FOREIGN BODY REMOVAL BRONCHIAL N/A 01/08/2018   Procedure: REMOVAL FOREIGN BODY BRONCHIAL;  Surgeon: Jodi Marble, MD;  [facility];  Service: ENT;  Laterality: N/A;   LARYNGECTOMY     Porta cath removal     PORTACATH PLACEMENT  09/17/10   Tip in cavoatrial junction   RE-EXCISION OF BREAST CANCER,SUPERIOR MARGINS Right 07/29/2018   Procedure: RE-EXCISION OF RIGHT BREAST MEDIAL MARGINS;  Surgeon: Rolm Bookbinder, MD;  Location: [facility];  Service: General;  Laterality: Right;   RIGID ESOPHAGOSCOPY N/A 03/19/2019   Procedure: FLEXIBLE ESOPHAGOSCOPY;  Surgeon: Jodi Marble, MD;  Location: [facility];  Service: ENT;  Laterality: N/A;   STOMAPLASTY N/A 10/21/2016   Procedure: Zola Button;  Surgeon: [name], MD;  Location: [facility];  Service: ENT;  Laterality: N/A;   TOTAL KNEE ARTHROPLASTY Right 12/12/2019    Procedure: RIGHT TOTAL KNEE ARTHROPLASTY;  Surgeon: Leandrew Koyanagi, MD;  Location: [place];  Service: Orthopedics;  Laterality: Right;   TOTAL KNEE ARTHROPLASTY Left 04/01/2021   Procedure: LEFT TOTAL KNEE ARTHROPLASTY;  Surgeon: Leandrew Koyanagi, MD;  Location: [facility];  Service: Orthopedics;  Laterality: Left;   TRACHEAL DILITATION  07/16/2011   Procedure: TRACHEAL DILITATION;  Surgeon: Beckie Salts, MD;  Location: [facility];  Service: ENT;  Laterality: N/A;  dilation of tracheal stoma and replacement of stoma tube   TUBAL LIGATION  1982     OB History   No obstetric history on file.     Family History  Problem Relation Age of Onset   Heart disease Mother    Heart disease Father    Heart disease Sister    Cancer Brother        type unknown   Liver disease Maternal Grandmother    Cancer Sister        type unknown   Diabetes Sister    Breast cancer Neg Hx     Social History   Tobacco Use   Smoking status: Former    Packs/day: 2.00    Years: 20.00    Pack years: 40.00    Types: Cigarettes    Quit date: 09/17/2010    Years since quitting: 10.6   Smokeless tobacco: Never  Vaping Use   Vaping Use: Never used  Substance Use Topics   Alcohol use: Yes    Comment: Socially drinks    Drug use: No    Comment: tried cocaine 1 time 2010 only used 1 time    Home Medications Prior to Admission medications   Medication Sig Start Date End Date Taking? Authorizing Provider  albuterol (PROAIR HFA) 108 (90 Base) MCG/ACT inhaler Inhale 2 puffs into the lungs every 4 (four) hours as needed for wheezing or shortness of breath. 12/11/20   Benay Pike, MD  albuterol (PROVENTIL) (5 MG/ML) 0.5% nebulizer solution Take 0.5 mLs (2.5 mg total) by nebulization every 6 (six) hours as needed for wheezing or shortness of breath. Please give the patient 10 63m vials. 12/11/20   OBenay Pike MD  amLODipine (NORVASC) 10 MG tablet Take 1 tablet by mouth daily Patient taking differently: Take 10 mg by mouth  daily. 12/11/20   OBenay Pike MD  anastrozole (ARIMIDEX) 1 MG tablet Take 1 tablet (1 mg total) by mouth daily. 02/15/21   GNicholas Lose MD  aspirin EC 81 MG tablet Take 1 tablet (81 mg total) by mouth 2 (two) times daily. To be taken after surgery 01/21/21   SAundra Dubin PA-C  celecoxib (CELEBREX) 200 MG capsule Take 1 capsule (200 mg total) by mouth 2 (two) times daily. 04/17/21   Leandrew Koyanagi, MD  cetirizine (ZYRTEC) 10 MG tablet Take 1 tablet (10 mg total) by mouth daily. 12/20/20   Zola Button, MD  docusate sodium (COLACE) 100 MG capsule Take 1 capsule (100 mg total) by mouth daily as needed. 01/21/21 01/21/22  Aundra Dubin, PA-C  escitalopram (LEXAPRO) 10 MG tablet Take 1 tablet (10 mg total) by mouth daily. 12/25/20   Zola Button, MD  Humidifiers MISC 1 application by Tracheal Tube route as needed. 02/05/21   Zola Button, MD  HYDROcodone-acetaminophen (NORCO) 5-325 MG tablet Take 1 tablet by mouth 2 (two) times daily as needed. 04/22/21   Aundra Dubin, PA-C  ipratropium-albuterol (DUONEB) 0.5-2.5 (3) MG/3ML SOLN Take 3 mLs by nebulization every 4 (four) hours as needed. 07/19/20   Martyn Malay, MD  levothyroxine (SYNTHROID) 200 MCG tablet Take 1 tablet (200 mcg total) by mouth daily before breakfast. 12/18/20   Hensel, Jamal Collin, MD  losartan-hydrochlorothiazide (HYZAAR) 100-12.5 MG tablet Take 1 tablet by mouth daily. 12/25/20   Zola Button, MD  methocarbamol (ROBAXIN) 500 MG tablet Take 1 tablet (500 mg total) by mouth 2 (two) times daily as needed. To be taken after surgery 01/21/21   Aundra Dubin, PA-C  metoprolol tartrate (LOPRESSOR) 50 MG tablet Take 1 tablet (50 mg total) by mouth 2 (two) times daily. 12/20/20   Zola Button, MD  mometasone-formoterol [facility]) 200-5 MCG/ACT AERO Inhale 2 puffs into the lungs 2 (two) times daily. 12/11/20   Benay Pike, MD  nystatin cream (MYCOSTATIN) APPLY  CREAM TOPICALLY TWICE DAILY AS NEEDED Patient taking differently: Apply 1 application  topically daily as needed for dry skin. 09/20/19   Benay Pike, MD  omeprazole (PRILOSEC) 40 MG capsule Take 1 capsule (40 mg total) by mouth 2 (two) times daily. 12/25/20   Zola Button, MD  ondansetron (ZOFRAN) 4 MG tablet Take 1 tablet (4 mg total) by mouth every 8 (eight) hours as needed for nausea or vomiting. 01/21/21   Aundra Dubin, PA-C  oxyCODONE-acetaminophen (PERCOCET) 5-325 MG tablet Take 1-2 tablets by mouth every 6 (six) hours as needed. To be taken after surgery 01/21/21   Aundra Dubin, PA-C  oxyCODONE-acetaminophen (PERCOCET) 5-325 MG tablet Take 1-2 tablets by mouth 2 (two) times daily as needed for severe pain. 04/17/21   Leandrew Koyanagi, MD  Tiotropium Bromide Monohydrate (SPIRIVA RESPIMAT) 2.5 MCG/ACT AERS Inhale 2 puffs into the lungs daily. 04/16/21   Zola Button, MD    Allergies    Lisinopril  Review of Systems   Review of Systems  Constitutional:  Negative for chills and fever.  Respiratory:  Positive for cough, shortness of breath and wheezing.   Cardiovascular:  Positive for chest pain.  Gastrointestinal:  Positive for nausea. Negative for diarrhea and vomiting.  All other systems reviewed and are negative.  Physical Exam Updated Vital Signs BP (!) 147/82   Pulse 87   Temp 97.7 F (36.5 C)   Resp 18   SpO2 94%   Physical Exam Vitals and nursing note reviewed.  Constitutional:      General: She is not in acute distress.    Appearance: She is not ill-appearing.  HENT:     Head: Normocephalic.  Eyes:     Pupils: Pupils are equal, round, and reactive to light.  Neck:     Comments: Tracheostomy in place  Cardiovascular:     Rate and Rhythm: Normal rate and regular rhythm.     Pulses: Normal pulses.     Heart sounds: Normal heart sounds. No murmur heard.   No friction rub. No gallop.  Pulmonary:     Effort: Pulmonary effort is normal.     Comments: Upper respiratory sounds with mild expiratory wheeze Abdominal:     General: Abdomen is flat. There  is no distension.     Palpations: Abdomen is soft.     Tenderness: There is no abdominal tenderness. There is no guarding or rebound.  Musculoskeletal:        General: Normal range of motion.     Cervical back: Neck supple.  Skin:    General: Skin is warm and dry.  Neurological:     General: No focal deficit present.     Mental Status: She is alert.  Psychiatric:        Mood and Affect: Mood normal.        Behavior: Behavior normal.    ED Results / Procedures / Treatments   Labs (all labs ordered are listed, but only abnormal results are displayed) Labs Reviewed  COMPREHENSIVE METABOLIC PANEL - Abnormal; Notable for the following components:      Result Value   Potassium 3.0 (*)    Creatinine, Ser 1.05 (*)    Calcium 8.7 (*)    GFR, Estimated 60 (*)    Anion gap 16 (*)    All other components within normal limits  RESP PANEL BY RT-PCR (FLU A&B, COVID) ARPGX2  CBC WITH DIFFERENTIAL/PLATELET  TROPONIN I (HIGH SENSITIVITY)    EKG EKG Interpretation  Date/Time:  Saturday May 11 2021 08:32:36 EST Ventricular Rate:  100 PR Interval:  176 QRS Duration: 84 QT Interval:  360 QTC Calculation: 464 R Axis:   60 Text Interpretation: Normal sinus rhythm Possible Left atrial enlargement Cannot rule out Anterior infarct , age undetermined ST & T wave abnormality, consider inferolateral ischemia Abnormal ECG no significant change when compared to prior on Aug 2022 Confirmed by Madalyn Rob [phone]) on 05/11/2021 9:01:37 AM  Radiology DG Chest 2 View  Result Date: 05/11/2021 CLINICAL DATA:  Shortness of breath EXAM: CHEST - 2 VIEW COMPARISON:  Previous studies including the examination of 04/30/2021 FINDINGS: Transverse diameter of heart is increased. There is dense atelectasis in the left lower lobe suggested by homogeneous opacity in the medial left lower lung fields. In the previous CT, a foreign body was noted in the proximal course of left lower lobe bronchus which could  not be identified in the radiograph. There are no new infiltrates or signs of pulmonary edema. Costophrenic angles are clear. There is no pneumothorax. IMPRESSION: There is triangular opacity in the medial left lower lung fields consistent with dense atelectasis in the left lower lobe. There are no signs of pulmonary edema or new focal infiltrates. There is no pleural effusion or pneumothorax. Electronically Signed   By: Elmer Picker M.D.   On: 05/11/2021 09:51    Procedures Procedures   Medications Ordered in ED Medications  ondansetron (ZOFRAN) injection 4 mg (4 mg Intravenous Given 05/11/21 2952)    ED Course  I have reviewed the triage vital signs and the nursing notes.  Pertinent labs & imaging results that were available during my care of the patient were reviewed by me and considered in my medical decision making (see chart for details).  Clinical Course as of 05/11/21 1315  Sat May 11, 2021  0850 Spoke to RT who will see patient at bedside for possible suction [CA]    Clinical Course User Index [CA] Suzy Bouchard, PA-C   MDM Rules/Calculators/A&P                           65 year old female who is trach dependent secondary to laryngeal cancer presents to the ED due to worsening shortness of breath and chest pain for the past day.  Patient had CT scan on 11/17 which demonstrated an obstructed foreign body at the origin of the left mainstem bronchus.  Patient has appointment with pulmonology on Friday however, endorses worsening shortness of breath which prompted her to report to the ED.  Upon arrival, stable vitals.  Patient is afebrile, not tachycardic or hypoxic.  Patient in no acute distress.  Routine labs ordered to rule out signs of infection.  Chest x-ray to rule out possible aspiration pneumonia.  We will consult pulmonology to see if a emergent bronchoscopy is warranted at this time.  RT called to report to bed side to see if patient would benefit from suction.  Discussed with Dr. Roslynn Amble who evaluated patient at bedside and agrees with assessment and plan.  9:51 AM Patient evaluated by Dr. Valeta Harms with pulmonology who will perform a bronchoscopy and return patient to the ED for possible discharge pending resolution in symptoms.   CBC unremarkable.  No leukocytosis and normal hemoglobin.  CMP significant for mild hypokalemia at 3. Mild elevation in creatinine at 1.05.  Troponin normal. Low suspicion for ACS. COVID/influenza negative.  Chest x-ray demonstrates left dense atelectasis.  No pneumothorax or consolidation worrisome for pneumonia.  EKG demonstrates normal sinus rhythm.  No signs of acute ischemia.  No major changes from previous EKG.  Patient taken to undergo bronchoscopy. Patient will need to be reassessed after completion of procedure to ensure symptoms have improved prior to discharge. If patient endorses improvement, she may be discharged home with outpatient follow-up.  Final Clinical Impression(s) / ED Diagnoses Final diagnoses:  Shortness of breath  Foreign body in bronchus, initial encounter    Rx / DC Orders ED Discharge Orders     None        Suzy Bouchard, PA-C 05/11/21 1418    Lucrezia Starch, MD 05/12/21 [phone]

## 2021-05-11 NOTE — Op Note (Signed)
Video Bronchoscopy Procedure Note with cryotherapy, foreign body extraction and relief of obstruction.  Date of Operation: 05/11/2021  Pre-op Diagnosis: Left lower lobe foreign body  Post-op Diagnosis: Left lower lobe foreign body  Surgeon: Garner Nash, DO  Assistants: none  Anesthesia: General  Operation: Flexible video fiberoptic bronchoscopy and biopsies.  Estimated Blood Loss: <1OX  Complications: none noted  Indications and History: Deborah [name] is 65 y.o. with history of Laryngectomy .  Recommendation was to perform video fiberoptic bronchoscopy with biopsies. The risks, benefits, complications, treatment options and expected outcomes were discussed with the patient.  The possibilities of pneumothorax, pneumonia, reaction to medication, pulmonary aspiration, perforation of a viscus, bleeding, failure to diagnose a condition and creating a complication requiring transfusion or operation were discussed with the patient who freely signed the consent.    Description of Procedure: The patient was seen in the Preoperative Area, was examined and was deemed appropriate to proceed.  The patient was taken to endoscopy room 3, identified as Deborah [name] and the procedure verified as Flexible Video Fiberoptic Bronchoscopy.  A Time Out was held and the above information confirmed.   The standard scope was used for examination of the bilateral airways.  The right upper lobe right lower lobe and right middle lobe openings all appeared normal.  The left upper lobe appeared normal.  There was a significant mount of swollen tissue and granulation tissue at the mouth of the left lower lobe opening.  We initially started by using the forceps to probe down into the left lower lobe to see if we could feel the foreign body within the granulation tissue.  We also used a forceps to remove some of the granulation tissue covering the foreign body.  Once the foreign body was visualized we  used the 2.4 mm cryotherapy probe to debulk and free up some of the granulation tissue along the wall.  Mucosa was coated with a 1: 10,000 dilution of epinephrine.  During a inspiratory breath-hold we were able to have better visualization of the foreign body.  There appeared to be a metallic/metal center with plastic outer coating.  The cryotherapy probe would not freeze well to the silicone type plastic.  We had to use the forceps to try to rotate the foreign body to where we could access the central metal component.  We were able to place the 2.4 mm cryotherapy probe into the center of the foreign body/central metallic ring.  We were able to freeze the foreign body sticking it to the cryotherapy probe and retracting the entire piece through the endotracheal tube and out.  There was successful extraction of the foreign body which appears to be a Blom-Singer voice prosthesis.  Samples: 1.  Endobronchial cryobiopsies of granulation tissue  Plans:  Once recovered from postanesthesia care patient can return to the emergency department for observation and consideration for discharge home.                Garner Nash, DO [facility] 05/11/2021 3:43 PM

## 2021-05-11 NOTE — ED Notes (Signed)
Patient transported to X-ray 

## 2021-05-11 NOTE — Anesthesia Procedure Notes (Signed)
Procedure Name: Intubation Date/Time: 05/11/2021 2:57 PM Performed by: Clearnce Sorrel, CRNA Pre-anesthesia Checklist: Patient identified, Emergency Drugs available, Suction available and Patient being monitored Patient Re-evaluated:Patient Re-evaluated prior to induction Oxygen Delivery Method: Circle System Utilized Preoxygenation: Pre-oxygenation with 100% oxygen Induction Type: IV induction Tube type: trachostomy. Tube size: 8.5 mm Number of attempts: 1 Placement Confirmation: positive ETCO2 and breath sounds checked- equal and bilateral Tube secured with: Tape Dental Injury: Teeth and Oropharynx as per pre-operative assessment

## 2021-05-11 NOTE — Anesthesia Preprocedure Evaluation (Addendum)
Anesthesia Evaluation  Patient identified by MRN, date of birth, ID band Patient awake    Reviewed: Allergy & Precautions, H&P , NPO status , Patient's Chart, lab work & pertinent test results, reviewed documented beta blocker date and time   Airway Mallampati: Trach   Neck ROM: Full    Dental no notable dental hx. (+) Edentulous Upper, Edentulous Lower, Dental Advisory Given   Pulmonary shortness of breath and with exertion, asthma , COPD, former smoker,    Pulmonary exam normal breath sounds clear to auscultation       Cardiovascular hypertension, Pt. on medications and Pt. on home beta blockers + DOE   Rhythm:Regular Rate:Normal     Neuro/Psych Seizures -, Well Controlled,  Anxiety Depression    GI/Hepatic Neg liver ROS, GERD  Medicated,  Endo/Other  Hypothyroidism Morbid obesity  Renal/GU negative Renal ROS  negative genitourinary   Musculoskeletal   Abdominal   Peds  Hematology  (+) Blood dyscrasia, anemia ,   Anesthesia Other Findings   Reproductive/Obstetrics negative OB ROS                            Anesthesia Physical Anesthesia Plan  ASA: 3  Anesthesia Plan: General   Post-op Pain Management:    Induction: Intravenous  PONV Risk Score and Plan: 4 or greater and Ondansetron, Dexamethasone, Midazolam and Treatment may vary due to age or medical condition  Airway Management Planned: Tracheostomy  Additional Equipment:   Intra-op Plan:   Post-operative Plan: Extubation in OR  Informed Consent: I have reviewed the patients History and Physical, chart, labs and discussed the procedure including the risks, benefits and alternatives for the proposed anesthesia with the patient or authorized representative who has indicated his/her understanding and acceptance.     Dental advisory given  Plan Discussed with: CRNA  Anesthesia Plan Comments:         Anesthesia  Quick Evaluation

## 2021-05-12 ENCOUNTER — Encounter (HOSPITAL_COMMUNITY): Payer: Self-pay | Admitting: Pulmonary Disease

## 2021-05-13 ENCOUNTER — Telehealth: Payer: Self-pay

## 2021-05-13 NOTE — Telephone Encounter (Signed)
Transition Care Management Unsuccessful Follow-up Telephone Call  Date of discharge and from where:  05/11/2021 from [facility]  Attempts:  1st Attempt  Reason for unsuccessful TCM follow-up call:  Left voice message

## 2021-05-14 ENCOUNTER — Other Ambulatory Visit: Payer: Self-pay

## 2021-05-14 ENCOUNTER — Ambulatory Visit: Payer: Medicaid Other

## 2021-05-14 DIAGNOSIS — R2689 Other abnormalities of gait and mobility: Secondary | ICD-10-CM

## 2021-05-14 DIAGNOSIS — M6281 Muscle weakness (generalized): Secondary | ICD-10-CM

## 2021-05-14 DIAGNOSIS — M25562 Pain in left knee: Secondary | ICD-10-CM

## 2021-05-14 NOTE — Therapy (Signed)
[place], [place], [address] Phone: [phone]   Fax:  [phone]  Physical Therapy Treatment  Patient Details  Name: Deborah [name] MRN: [id number] Date of Birth: 01/31/1956 Referring Provider (PT): Leandrew Koyanagi, MD   Encounter Date: 05/14/2021   PT End of Session - 05/14/21 1820     Visit Number 3    Number of Visits 16    Date for PT Re-Evaluation 06/29/21    Authorization Type MCD - [employer]    PT Start Time 1817    PT Stop Time 46    PT Time Calculation (min) 43 min             Past Medical History:  Diagnosis Date   Anemia    h/o of   Anxiety    Arthritis    in knees   Asthma    Breast cancer ([place])    Bronchitis    COPD (chronic obstructive pulmonary disease) ([place])    Diverticulosis 11/15/2012   noted on screening colonoscopy    Dyspnea    with COPD exerbation   Esophageal stricture    Former smoker 03/19/2011   GERD (gastroesophageal reflux disease)    Heart murmur    asymptomatic    History of laryngectomy    History of radiation therapy 11/15/18- 12/06/18   Right Breast total dose 42.56 Gy in 16 fractions.    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   Leukocytopenia    Nausea alone 07/28/2013   Neck pain 01/21/2012   Normal MRI 07/14/2011   negative for mestasis    Pneumonia 2012   Sciatica    Seizures ([place])    07/24/11 off Effexor w/o seizure   Sepsis ([place]) 08/04/2012   Sinusitis, chronic 07/20/2011   Bilateral maxillary, identified on MRI of head 07/14/11.     Tracheostomy dependent [facility])     Past Surgical History:  Procedure Laterality Date   BREAST LUMPECTOMY Right 07/06/2018   BREAST LUMPECTOMY WITH  RADIOACTIVE SEED LOCALIZATION Right 07/06/2018   Procedure: RIGHT BREAST LUMPECTOMY WITH RADIOACTIVE SEED LOCALIZATION;  Surgeon: Rolm Bookbinder, MD;  Location: [place];  Service: General;  Laterality: Right;   COLONOSCOPY N/A 11/15/2012   Procedure: COLONOSCOPY;  Surgeon: Lafayette Dragon, MD;  [facility];  Service: Endoscopy;  Laterality: N/A;   CRYOTHERAPY  05/11/2021   Procedure: CRYOTHERAPY;  Surgeon: Garner Nash, DO;  Location: [place] [facility];  Service: Pulmonary;;   DENTAL RESTORATION/EXTRACTION WITH X-RAY     ESOPHAGEAL DILATION N/A 09/09/2019   Procedure: ESOPHAGEAL DILATION;  Surgeon: [name], MD;  Location: [place];  Service: ENT;  Laterality: N/A;   ESOPHAGEAL DILATION N/A 10/05/2019   Procedure: ESOPHAGEAL DILATION;  Surgeon: [name], MD;  Location: [place];  Service: ENT;  Laterality: N/A;  via tracheostomy   ESOPHAGEAL DILATION N/A 02/27/2020   Procedure: ESOPHAGEAL DILATION;  Surgeon: [name], MD;  Location: [place];  Service: ENT;  Laterality: N/A;   ESOPHAGOGASTRODUODENOSCOPY (EGD) WITH PROPOFOL N/A 08/12/2018   Procedure: ESOPHAGOGASTRODUODENOSCOPY (EGD) WITH PROPOFOL;  Surgeon: Doran Stabler, MD;  Location: [place];  Service: Gastroenterology;  Laterality: N/A;  ESOPHAGOSCOPY  06/21/2012   Procedure: ESOPHAGOSCOPY;  Surgeon: [name], MD;  Location: [place];  Service: ENT;  Laterality: N/A;   ESOPHAGOSCOPY WITH DILITATION N/A 09/21/2014   Procedure: ESOPHAGOSCOPY WITH DILITATION;  Surgeon: [name], MD;  Location: [place];  Service: ENT;  Laterality: N/A;   ESOPHAGOSCOPY WITH DILITATION N/A 07/04/2016   Procedure: ESOPHAGOSCOPY WITH DILITATION;  Surgeon: [name], MD;  Location: [place];  Service: ENT;  Laterality: N/A;   ESOPHAGOSCOPY WITH DILITATION N/A 12/01/2017   Procedure: ESOPHAGOSCOPY WITH DILITATION;  Surgeon: [name], MD;  Location: [place];  Service: ENT;   Laterality: N/A;   ESOPHAGOSCOPY WITH DILITATION N/A 04/19/2018   Procedure: ESOPHAGOSCOPY WITH DILITATION;  Surgeon: [name], MD;  Location: [place];  Service: ENT;  Laterality: N/A;   ESOPHAGOSCOPY WITH DILITATION N/A 03/07/2019   Procedure: Esophagoscopy with dilatation;  Surgeon: [name], MD;  Location: [place];  Service: ENT;  Laterality: N/A;   ESOPHAGOSCOPY WITH DILITATION N/A 07/16/2020   Procedure: ESOPHAGOSCOPY WITH DILATION;  Surgeon: [name], MD;  Location: [place];  Service: ENT;  Laterality: N/A;   [place] N/A 10/08/2020   Procedure: ESOPHAGOSCOPY With Dilation;  Surgeon: [name], MD;  Location: [place];  Service: ENT;  Laterality: N/A;  [place]  01/08/2018       FOREIGN BODY REMOVAL  05/11/2021   Procedure: FOREIGN BODY REMOVAL;  Surgeon: Garner Nash, DO;  Location: [place];  Service: Pulmonary;;   FOREIGN BODY REMOVAL BRONCHIAL  10/02/2011   Procedure: REMOVAL FOREIGN BODY BRONCHIAL;  Surgeon: Ruby Cola, MD;  Location: [place];  Service: ENT;  Laterality: N/A;   FOREIGN BODY REMOVAL BRONCHIAL N/A 01/08/2018   Procedure: REMOVAL FOREIGN BODY BRONCHIAL;  Surgeon: Jodi Marble, MD;  [facility];  Service: ENT;  Laterality: N/A;   HEMOSTASIS CONTROL  05/11/2021   Procedure: HEMOSTASIS CONTROL;  Surgeon: Garner Nash, DO;  Location: [place];  Service: Pulmonary;;   LARYNGECTOMY     Porta cath removal     PORTACATH PLACEMENT  09/17/10   Tip in cavoatrial junction   RE-EXCISION OF BREAST CANCER,SUPERIOR MARGINS Right 07/29/2018   Procedure: RE-EXCISION OF RIGHT BREAST MEDIAL MARGINS;  Surgeon: Rolm Bookbinder, MD;  Location: [place];  Service: General;  Laterality: Right;   RIGID ESOPHAGOSCOPY N/A 03/19/2019   Procedure: FLEXIBLE ESOPHAGOSCOPY;  Surgeon: Jodi Marble, MD;  Location: [place];  Service: ENT;  Laterality: N/A;    STOMAPLASTY N/A 10/21/2016   Procedure: Zola Button;  Surgeon: [name], MD;  Location: [place];  Service: ENT;  Laterality: N/A;   TOTAL KNEE ARTHROPLASTY Right 12/12/2019   Procedure: RIGHT TOTAL KNEE ARTHROPLASTY;  Surgeon: Leandrew Koyanagi, MD;  Location: [place];  Service: Orthopedics;  Laterality: Right;   TOTAL KNEE ARTHROPLASTY Left 04/01/2021   Procedure: LEFT TOTAL KNEE ARTHROPLASTY;  Surgeon: Leandrew Koyanagi, MD;  Location: [place];  Service: Orthopedics;  Laterality: Left;   TRACHEAL DILITATION  07/16/2011   Procedure: TRACHEAL DILITATION;  Surgeon: Beckie Salts, MD;  Location: [place];  Service: ENT;  Laterality: N/A;  dilation of tracheal stoma and replacement of stoma tube   [place] Bilateral 05/11/2021   Procedure: VIDEO BRONCHOSCOPY WITHOUT FLUORO;  Surgeon: Garner Nash, DO;  Location: [place];  Service: Pulmonary;  Laterality: Bilateral;  w/ cryotherapy, Foreign body extraction  There were no vitals filed for this visit.   Subjective Assessment - 05/14/21 1820     Subjective Pt presents to PT with reports of L knee pain and stiffness. She has been compliant with her HEP with no adverse effect. Pt is ready to begin PT at this time.    Currently in Pain? Yes    Pain Score 10-Worst pain ever           [place] Adult PT Treatment/Exercise:   Therapeutic Exercise:  NuStep lvl 4 UE/LE x 4 min while taking subjective LAQ 3x10 4lbs Seated heel slide x 10 - 5" L STS 2x10 - no UE support Step ups 2x10 fwd 8in Standing heel toe raises x 15 Standing L hamstring curl 2x10   Manual Therapy: N/A   Neuromuscular re-ed: N/A   Therapeutic Activity: N/A   Modalities: NA   Self Care: N/A   Consider / progression for next session:      [facility] PT Assessment - 05/15/21 0001       AROM   Left Knee Flexion 105                                      PT Short Term Goals - 05/04/21 0953       PT SHORT TERM  GOAL #1   Title Deborah [name] will be >75% HEP compliant to improve carryover between sessions and facilitate independent management of condition    Target Date 05/25/21               PT Long Term Goals - 05/04/21 0953       PT LONG TERM GOAL #1   Title Target date for all long term goals:  06/27/20      PT LONG TERM GOAL #2   Title Deborah [name] will improve KOOS, Jr. score from 42 (on evaluation) to 62 as a proxy for functional improvement      PT LONG TERM GOAL #3   Title Deborah [name] will achieve 3 degrees knee extension by D/C (see POC end date) to improve stability in stance phase of gait  EVAL: 7 degrees      PT LONG TERM GOAL #4   Title Deborah [name] will achieve 100 degrees knee flexion by D/C (see POC end date) to improve ability to safely navigate steps in the community  EVAL: 80 degrees      PT LONG TERM GOAL #5   Title Deborah [name] will improve 30'' STS (MCID 2) to >/= 8x (w/ UE?: n) to show improved LE strength and improved transfers  EVAL: 5x  w/ UE? Y      Additional Long Term Goals   Additional Long Term Goals Yes      PT LONG TERM GOAL #6   Title Deborah [name] will be able to put shoes and socks on, not limited by pain  EVAL: limited                   Plan - 05/15/21 0956     Clinical Impression Statement Pt was able to complete prescribed exercises with no adverse effect. Therapy today again focused on improving L LE strength and L knee ROM post sx. She continues to progress well, showing improved L knee flexion today. PT will continue to progress as tolerated per POC.    PT Treatment/Interventions ADLs/Self Care Home Management;Aquatic Therapy;Electrical Stimulation;Iontophoresis 51m/ml Dexamethasone;Gait training;Therapeutic activities;Therapeutic exercise;Neuromuscular re-education;Manual techniques;Dry  needling;Vasopneumatic Device    PT Next Visit Plan progress L knee strength and ROM as able    PT Home Exercise Plan [facility]             Patient will benefit from skilled  therapeutic intervention in order to improve the following deficits and impairments:  Abnormal gait, Decreased balance, Difficulty walking, Pain, Impaired flexibility, Decreased strength, Decreased range of motion  Visit Diagnosis: Left knee pain, unspecified chronicity  Muscle weakness (generalized)  Other abnormalities of gait and mobility     Problem List Patient Active Problem List   Diagnosis Date Noted   FB bronchus    Status post total left knee replacement 04/01/2021   Primary osteoarthritis of left knee 03/31/2021   Peripheral polyneuropathy 12/12/2020   Status post total right knee replacement 12/12/2019   Primary osteoarthritis of right knee 12/11/2019   Localized swelling, mass and lump, neck 07/20/2019   Allergic rhinitis 05/20/2019   GERD (gastroesophageal reflux disease)    COPD (chronic obstructive pulmonary disease) (HCC)    Chronic diastolic (congestive) heart failure ([place])    Breast cancer ([place]) 07/06/2018   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   Major depressive disorder 04/08/2018   Carpal tunnel syndrome 03/16/2018   Obesity 08/24/2017   Plantar fasciitis 06/06/2016   Low back pain 05/21/2016   Osteoarthritis of knees, bilateral 05/31/2015   Knee pain 01/04/2015   Dysphagia 08/16/2014   Weight gain 08/05/2014   DOE (dyspnea on exertion) 04/28/2014   Frequent falls 12/23/2013   Anemia in chronic illness 08/11/2012   Status post trachelectomy 08/04/2012   Chronic pain 01/20/2012   History of head and neck cancer 09/26/2011   Hx of radiation therapy    Tracheitis 07/17/2011   Seizure ([place]) 07/17/2011   Hypothyroidism 03/19/2011   Essential hypertension 03/19/2011   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 [place], [place], [address] Phone: [phone]   Fax:  [phone]  Name: Deborah [name] MRN:  [id number] Date of Birth: Feb 03, 1956

## 2021-05-14 NOTE — Telephone Encounter (Signed)
Transition Care Management Unsuccessful Follow-up Telephone Call  Date of discharge and from where:  05/11/2021 from [facility]  Attempts:  2nd Attempt  Reason for unsuccessful TCM follow-up call:  Left voice message

## 2021-05-15 ENCOUNTER — Ambulatory Visit (INDEPENDENT_AMBULATORY_CARE_PROVIDER_SITE_OTHER): Payer: Medicaid Other

## 2021-05-15 ENCOUNTER — Ambulatory Visit (INDEPENDENT_AMBULATORY_CARE_PROVIDER_SITE_OTHER): Payer: Medicaid Other | Admitting: Orthopaedic Surgery

## 2021-05-15 ENCOUNTER — Encounter: Payer: Self-pay | Admitting: Orthopaedic Surgery

## 2021-05-15 DIAGNOSIS — Z96652 Presence of left artificial knee joint: Secondary | ICD-10-CM

## 2021-05-15 MED ORDER — METHOCARBAMOL 500 MG PO TABS: 500.0000 mg | ORAL_TABLET | Freq: Four times a day (QID) | ORAL | 2 refills | Status: DC | PRN

## 2021-05-15 MED ORDER — HYDROCODONE-ACETAMINOPHEN 5-325 MG PO TABS: 1.0000 | ORAL_TABLET | Freq: Every day | ORAL | 0 refills | Status: DC | PRN

## 2021-05-15 NOTE — Progress Notes (Signed)
Post-Op Visit Note   Patient: Deborah [name]           Date of Birth: 1955-07-04           MRN: [id number] Visit Date: 05/15/2021 PCP: Zola Button, MD   Assessment & Plan:  Chief Complaint:  Chief Complaint  Patient presents with   Left Knee - Follow-up, Routine Post Op, Pain   Visit Diagnoses:  1. Status post total left knee replacement     Plan: [name] is 6 weeks status post left total knee replacement.  She has been to 2 physical therapy sessions in which her Street location.  She is doing this once a week but she is doing really well with her own exercises at home.  Requesting refill on hydrocodone and Robaxin.  Left knee shows fully healed surgical scar.  Range of motion is 0 to 100 degrees.  Stable to varus valgus.  X-rays show stable knee replacement without any complications.  She will continue to do outpatient PT.  Can discontinue aspirin at this point.  Dental prophylaxis reinforced.  Robaxin and Norco refilled today.  Recheck in 6 weeks.  Follow-Up Instructions: Return in about 6 weeks (around 06/26/2021).   Orders:  Orders Placed This Encounter  Procedures   XR Knee 1-2 Views Left   Meds ordered this encounter  Medications   methocarbamol (ROBAXIN) 500 MG tablet    Sig: Take 1 tablet (500 mg total) by mouth every 6 (six) hours as needed for muscle spasms.    Dispense:  30 tablet    Refill:  2   HYDROcodone-acetaminophen (NORCO) 5-325 MG tablet    Sig: Take 1-2 tablets by mouth daily as needed.    Dispense:  20 tablet    Refill:  0    Imaging: XR Knee 1-2 Views Left  Result Date: 05/15/2021 Stable total knee replacement in good alignment.    PMFS History: Patient Active Problem List   Diagnosis Date Noted   FB bronchus    Status post total left knee replacement 04/01/2021   Primary osteoarthritis of left knee 03/31/2021   Peripheral polyneuropathy 12/12/2020   Status post total right knee replacement 12/12/2019   Primary osteoarthritis of  right knee 12/11/2019   Localized swelling, mass and lump, neck 07/20/2019   Allergic rhinitis 05/20/2019   GERD (gastroesophageal reflux disease)    COPD (chronic obstructive pulmonary disease) (HCC)    Chronic diastolic (congestive) heart failure ([place])    Breast cancer ([place]) 07/06/2018   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   Major depressive disorder 04/08/2018   Carpal tunnel syndrome 03/16/2018   Obesity 08/24/2017   Plantar fasciitis 06/06/2016   Low back pain 05/21/2016   Osteoarthritis of knees, bilateral 05/31/2015   Knee pain 01/04/2015   Dysphagia 08/16/2014   Weight gain 08/05/2014   DOE (dyspnea on exertion) 04/28/2014   Frequent falls 12/23/2013   Anemia in chronic illness 08/11/2012   Status post trachelectomy 08/04/2012   Chronic pain 01/20/2012   History of head and neck cancer 09/26/2011   Hx of radiation therapy    Tracheitis 07/17/2011   Seizure ([place]) 07/17/2011   Hypothyroidism 03/19/2011   Essential hypertension 03/19/2011   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   Leukocytopenia    Nausea alone 07/28/2013    Neck pain 01/21/2012   Normal MRI 07/14/2011   negative for mestasis    Pneumonia 2012   Sciatica    Seizures ([place])    07/24/11 off Effexor w/o seizure   Sepsis ([place]) 08/04/2012   Sinusitis, chronic 07/20/2011   Bilateral maxillary, identified on MRI of head 07/14/11.     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[place])     Family History  Problem Relation Age of Onset   Heart disease Mother    Heart disease Father    Heart disease Sister    Cancer Brother        type unknown   Liver disease Maternal Grandmother    Cancer Sister        type unknown   Diabetes Sister    Breast cancer Neg Hx     Past Surgical History:  Procedure Laterality Date   BREAST LUMPECTOMY Right 07/06/2018   BREAST LUMPECTOMY WITH RADIOACTIVE SEED LOCALIZATION Right 07/06/2018   Procedure: RIGHT BREAST LUMPECTOMY WITH RADIOACTIVE SEED LOCALIZATION;  Surgeon: Rolm Bookbinder, MD;  Location: [place];  Service: General;  Laterality: Right;   COLONOSCOPY N/A 11/15/2012   Procedure: COLONOSCOPY;  Surgeon: Lafayette Dragon, MD;  [facility];  Service: Endoscopy;  Laterality: N/A;   CRYOTHERAPY  05/11/2021   Procedure: CRYOTHERAPY;  Surgeon: Garner Nash, DO;  Location: [facility] [facility];  Service: Pulmonary;;   DENTAL RESTORATION/EXTRACTION WITH X-RAY     ESOPHAGEAL DILATION N/A 09/09/2019   Procedure: ESOPHAGEAL DILATION;  Surgeon: [name], MD;  Location: [place];  Service: ENT;  Laterality: N/A;   ESOPHAGEAL DILATION N/A 10/05/2019   Procedure: ESOPHAGEAL DILATION;  Surgeon: [name], MD;  Location: [place];  Service: ENT;  Laterality: N/A;  via tracheostomy   ESOPHAGEAL DILATION N/A 02/27/2020   Procedure: ESOPHAGEAL DILATION;  Surgeon: [name], MD;  Location: [place];  Service: ENT;  Laterality: N/A;   ESOPHAGOGASTRODUODENOSCOPY (EGD) WITH PROPOFOL N/A 08/12/2018   Procedure: ESOPHAGOGASTRODUODENOSCOPY (EGD) WITH PROPOFOL;  Surgeon: Doran Stabler, MD;   Location: [place];  Service: Gastroenterology;  Laterality: N/A;   ESOPHAGOSCOPY  06/21/2012   Procedure: ESOPHAGOSCOPY;  Surgeon: [name], MD;  Location: [place];  Service: ENT;  Laterality: N/A;   ESOPHAGOSCOPY WITH DILITATION N/A 09/21/2014   Procedure: ESOPHAGOSCOPY WITH DILITATION;  Surgeon: [name], MD;  Location: [place];  Service: ENT;  Laterality: N/A;   ESOPHAGOSCOPY WITH DILITATION N/A 07/04/2016   Procedure: ESOPHAGOSCOPY WITH DILITATION;  Surgeon: [name], MD;  Location: [place];  Service: ENT;  Laterality: N/A;   ESOPHAGOSCOPY WITH DILITATION N/A 12/01/2017   Procedure: ESOPHAGOSCOPY WITH  DILITATION;  Surgeon: [name], MD;  Location: [place];  Service: ENT;  Laterality: N/A;   ESOPHAGOSCOPY WITH DILITATION N/A 04/19/2018   Procedure: ESOPHAGOSCOPY WITH DILITATION;  Surgeon: [name], MD;  Location: [place];  Service: ENT;  Laterality: N/A;   ESOPHAGOSCOPY WITH DILITATION N/A 03/07/2019   Procedure: Esophagoscopy with dilatation;  Surgeon: [name], MD;  Location: [place];  Service: ENT;  Laterality: N/A;   ESOPHAGOSCOPY WITH DILITATION N/A 07/16/2020   Procedure: ESOPHAGOSCOPY WITH DILATION;  Surgeon: [name], MD;  Location: [place];  Service: ENT;  Laterality: N/A;   [place] N/A 10/08/2020   Procedure: ESOPHAGOSCOPY With Dilation;  Surgeon: [name], MD;  Location: [place];  Service: ENT;  Laterality: N/A;  [address] [place]  01/08/2018       FOREIGN BODY REMOVAL  05/11/2021   Procedure: FOREIGN BODY REMOVAL;  Surgeon: Garner Nash, DO;  Location: [place];  Service: Pulmonary;;   FOREIGN BODY REMOVAL BRONCHIAL  10/02/2011   Procedure: REMOVAL FOREIGN BODY BRONCHIAL;  Surgeon: Ruby Cola, MD;  Location: [place];  Service: ENT;  Laterality: N/A;   FOREIGN BODY REMOVAL BRONCHIAL N/A 01/08/2018   Procedure: REMOVAL FOREIGN BODY  BRONCHIAL;  Surgeon: Jodi Marble, MD;  [facility];  Service: ENT;  Laterality: N/A;   HEMOSTASIS CONTROL  05/11/2021   Procedure: HEMOSTASIS CONTROL;  Surgeon: Garner Nash, DO;  Location: [place];  Service: Pulmonary;;   LARYNGECTOMY     Porta cath removal     PORTACATH PLACEMENT  09/17/10   Tip in cavoatrial junction   RE-EXCISION OF BREAST CANCER,SUPERIOR MARGINS Right 07/29/2018   Procedure: RE-EXCISION OF RIGHT BREAST MEDIAL MARGINS;  Surgeon: Rolm Bookbinder, MD;  Location: [place];  Service: General;  Laterality: Right;   RIGID ESOPHAGOSCOPY N/A 03/19/2019   Procedure: FLEXIBLE ESOPHAGOSCOPY;  Surgeon: Jodi Marble, MD;  Location: [place];  Service: ENT;  Laterality: N/A;   STOMAPLASTY N/A 10/21/2016   Procedure: Zola Button;  Surgeon: [name], MD;  Location: [place];  Service: ENT;  Laterality: N/A;   TOTAL KNEE ARTHROPLASTY Right 12/12/2019   Procedure: RIGHT TOTAL KNEE ARTHROPLASTY;  Surgeon: Leandrew Koyanagi, MD;  Location: [place];  Service: Orthopedics;  Laterality: Right;   TOTAL KNEE ARTHROPLASTY Left 04/01/2021   Procedure: LEFT TOTAL KNEE ARTHROPLASTY;  Surgeon: Leandrew Koyanagi, MD;  Location: [place];  Service: Orthopedics;  Laterality: Left;   TRACHEAL DILITATION  07/16/2011   Procedure: TRACHEAL DILITATION;  Surgeon: Beckie Salts, MD;  Location: [place];  Service: ENT;  Laterality: N/A;  dilation of tracheal stoma and replacement of stoma tube   [place] Bilateral 05/11/2021   Procedure: VIDEO BRONCHOSCOPY WITHOUT FLUORO;  Surgeon: Garner Nash, DO;  Location: [place];  Service: Pulmonary;  Laterality: Bilateral;  w/ cryotherapy, Foreign body extraction   Social History   Occupational History   Occupation: part time    Comment: warehouse work  Tobacco Use   Smoking status: Former    Packs/day: 2.00    Years: 20.00    Pack years: 40.00    Types: Cigarettes    Quit date: 09/17/2010    Years since quitting: 10.6    Smokeless tobacco: Never  Vaping Use   Vaping Use: Never used  Substance and Sexual Activity   Alcohol use: Yes    Comment: Socially drinks  Drug use: No    Comment: tried cocaine 1 time 2010 only used 1 time   Sexual activity: Yes    Birth control/protection: Surgical

## 2021-05-15 NOTE — Telephone Encounter (Signed)
Transition Care Management Unsuccessful Follow-up Telephone Call  Date of discharge and from where:  05/11/2021-[place]   Attempts:  3rd Attempt  Reason for unsuccessful TCM follow-up call:  Left voice message

## 2021-05-16 ENCOUNTER — Ambulatory Visit: Payer: Medicaid Other | Attending: Orthopaedic Surgery | Admitting: Physical Therapy

## 2021-05-16 ENCOUNTER — Other Ambulatory Visit: Payer: Self-pay

## 2021-05-16 ENCOUNTER — Encounter: Payer: Self-pay | Admitting: Physical Therapy

## 2021-05-16 DIAGNOSIS — M25562 Pain in left knee: Secondary | ICD-10-CM

## 2021-05-16 DIAGNOSIS — K222 Esophageal obstruction: Secondary | ICD-10-CM | POA: Diagnosis not present

## 2021-05-16 DIAGNOSIS — R2689 Other abnormalities of gait and mobility: Secondary | ICD-10-CM | POA: Diagnosis present

## 2021-05-16 DIAGNOSIS — M6281 Muscle weakness (generalized): Secondary | ICD-10-CM | POA: Diagnosis present

## 2021-05-16 DIAGNOSIS — M25561 Pain in right knee: Secondary | ICD-10-CM | POA: Insufficient documentation

## 2021-05-16 DIAGNOSIS — G8929 Other chronic pain: Secondary | ICD-10-CM | POA: Insufficient documentation

## 2021-05-16 LAB — SURGICAL PATHOLOGY

## 2021-05-16 NOTE — Therapy (Signed)
[place], [place], [address] Phone: [phone]   Fax:  [phone]  Physical Therapy Treatment  Patient Details  Name: Deborah [name] MRN: [id number] Date of Birth: 02-19-1956 Referring Provider (PT): Leandrew Koyanagi, MD   Encounter Date: 05/16/2021   PT End of Session - 05/16/21 1046     Visit Number 4    Number of Visits 16    Date for PT Re-Evaluation 06/29/21    Authorization Type MCD - [employer]    PT Start Time 8786    PT Stop Time 1127    PT Time Calculation (min) 42 min             Past Medical History:  Diagnosis Date   Anemia    h/o of   Anxiety    Arthritis    in knees   Asthma    Breast cancer ([facility])    Bronchitis    COPD (chronic obstructive pulmonary disease) ([facility])    Diverticulosis 11/15/2012   noted on screening colonoscopy    Dyspnea    with COPD exerbation   Esophageal stricture    Former smoker 03/19/2011   GERD (gastroesophageal reflux disease)    Heart murmur    asymptomatic    History of laryngectomy    History of radiation therapy 11/15/18- 12/06/18   Right Breast total dose 42.56 Gy in 16 fractions.    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   Leukocytopenia    Nausea alone 07/28/2013   Neck pain 01/21/2012   Normal MRI 07/14/2011   negative for mestasis    Pneumonia 2012   Sciatica    Seizures ([facility])    07/24/11 off Effexor w/o seizure   Sepsis ([facility]) 08/04/2012   Sinusitis, chronic 07/20/2011   Bilateral maxillary, identified on MRI of head 07/14/11.     Tracheostomy dependent [facility])     Past Surgical History:  Procedure Laterality Date   BREAST LUMPECTOMY Right 07/06/2018   BREAST LUMPECTOMY WITH  RADIOACTIVE SEED LOCALIZATION Right 07/06/2018   Procedure: RIGHT BREAST LUMPECTOMY WITH RADIOACTIVE SEED LOCALIZATION;  Surgeon: Rolm Bookbinder, MD;  Location: [facility];  Service: General;  Laterality: Right;   COLONOSCOPY N/A 11/15/2012   Procedure: COLONOSCOPY;  Surgeon: Lafayette Dragon, MD;  Location: [facility];  Service: Endoscopy;  Laterality: N/A;   CRYOTHERAPY  05/11/2021   Procedure: CRYOTHERAPY;  Surgeon: Garner Nash, DO;  Location: [facility];  Service: Pulmonary;;   DENTAL RESTORATION/EXTRACTION WITH X-RAY     ESOPHAGEAL DILATION N/A 09/09/2019   Procedure: ESOPHAGEAL DILATION;  Surgeon: [name], MD;  Location: [facility];  Service: ENT;  Laterality: N/A;   ESOPHAGEAL DILATION N/A 10/05/2019   Procedure: ESOPHAGEAL DILATION;  Surgeon: [name], MD;  Location: [facility];  Service: ENT;  Laterality: N/A;  via tracheostomy   ESOPHAGEAL DILATION N/A 02/27/2020   Procedure: ESOPHAGEAL DILATION;  Surgeon: [name], MD;  Location: [facility];  Service: ENT;  Laterality: N/A;   ESOPHAGOGASTRODUODENOSCOPY (EGD) WITH PROPOFOL N/A 08/12/2018   Procedure: ESOPHAGOGASTRODUODENOSCOPY (EGD) WITH PROPOFOL;  Surgeon: Doran Stabler, MD;  Location: [facility];  Service: Gastroenterology;  Laterality: N/A;  ESOPHAGOSCOPY  06/21/2012   Procedure: ESOPHAGOSCOPY;  Surgeon: [name], MD;  Location: [facility];  Service: ENT;  Laterality: N/A;   ESOPHAGOSCOPY WITH DILITATION N/A 09/21/2014   Procedure: ESOPHAGOSCOPY WITH DILITATION;  Surgeon: [name], MD;  Location: [facility];  Service: ENT;  Laterality: N/A;   ESOPHAGOSCOPY WITH DILITATION N/A 07/04/2016   Procedure: ESOPHAGOSCOPY WITH DILITATION;  Surgeon: [name], MD;  Location: [facility];  Service: ENT;  Laterality: N/A;   ESOPHAGOSCOPY WITH DILITATION N/A 12/01/2017   Procedure: ESOPHAGOSCOPY WITH DILITATION;  Surgeon: [name], MD;  Location: [facility];  Service: ENT;   Laterality: N/A;   ESOPHAGOSCOPY WITH DILITATION N/A 04/19/2018   Procedure: ESOPHAGOSCOPY WITH DILITATION;  Surgeon: [name], MD;  Location: [facility];  Service: ENT;  Laterality: N/A;   ESOPHAGOSCOPY WITH DILITATION N/A 03/07/2019   Procedure: Esophagoscopy with dilatation;  Surgeon: [name], MD;  Location: [facility];  Service: ENT;  Laterality: N/A;   ESOPHAGOSCOPY WITH DILITATION N/A 07/16/2020   Procedure: ESOPHAGOSCOPY WITH DILATION;  Surgeon: [name], MD;  Location: [facility];  Service: ENT;  Laterality: N/A;   [place] N/A 10/08/2020   Procedure: ESOPHAGOSCOPY With Dilation;  Surgeon: [name], MD;  Location: [facility];  Service: ENT;  Laterality: N/A;  21 [place]  01/08/2018       FOREIGN BODY REMOVAL  05/11/2021   Procedure: FOREIGN BODY REMOVAL;  Surgeon: Garner Nash, DO;  Location: [facility];  Service: Pulmonary;;   FOREIGN BODY REMOVAL BRONCHIAL  10/02/2011   Procedure: REMOVAL FOREIGN BODY BRONCHIAL;  Surgeon: Ruby Cola, MD;  Location: [facility];  Service: ENT;  Laterality: N/A;   FOREIGN BODY REMOVAL BRONCHIAL N/A 01/08/2018   Procedure: REMOVAL FOREIGN BODY BRONCHIAL;  Surgeon: Jodi Marble, MD;  [facility];  Service: ENT;  Laterality: N/A;   HEMOSTASIS CONTROL  05/11/2021   Procedure: HEMOSTASIS CONTROL;  Surgeon: Garner Nash, DO;  Location: [facility];  Service: Pulmonary;;   LARYNGECTOMY     Porta cath removal     PORTACATH PLACEMENT  09/17/10   Tip in cavoatrial junction   RE-EXCISION OF BREAST CANCER,SUPERIOR MARGINS Right 07/29/2018   Procedure: RE-EXCISION OF RIGHT BREAST MEDIAL MARGINS;  Surgeon: Rolm Bookbinder, MD;  Location: [facility];  Service: General;  Laterality: Right;   RIGID ESOPHAGOSCOPY N/A 03/19/2019   Procedure: FLEXIBLE ESOPHAGOSCOPY;  Surgeon: Jodi Marble, MD;  Location: [place];  Service: ENT;  Laterality: N/A;    STOMAPLASTY N/A 10/21/2016   Procedure: [place];  Surgeon: [name], MD;  Location: [facility];  Service: ENT;  Laterality: N/A;   TOTAL KNEE ARTHROPLASTY Right 12/12/2019   Procedure: RIGHT TOTAL KNEE ARTHROPLASTY;  Surgeon: Leandrew Koyanagi, MD;  Location: [facility];  Service: Orthopedics;  Laterality: Right;   TOTAL KNEE ARTHROPLASTY Left 04/01/2021   Procedure: LEFT TOTAL KNEE ARTHROPLASTY;  Surgeon: Leandrew Koyanagi, MD;  Location: [facility];  Service: Orthopedics;  Laterality: Left;   TRACHEAL DILITATION  07/16/2011   Procedure: TRACHEAL DILITATION;  Surgeon: Beckie Salts, MD;  Location: [facility];  Service: ENT;  Laterality: N/A;  dilation of tracheal stoma and replacement of stoma tube   [place] Bilateral 05/11/2021   Procedure: VIDEO BRONCHOSCOPY WITHOUT FLUORO;  Surgeon: Garner Nash, DO;  Location: [facility];  Service: Pulmonary;  Laterality: Bilateral;  w/ cryotherapy, Foreign body extraction  There were no vitals filed for this visit.   Subjective Assessment - 05/16/21 1049     Subjective Pt reports that she has had continued L knee pain.  She went to her MD and was told thing were going well.  She has 10/10 L knee pain currently.              [place] Adult PT Treatment/Exercise:   Therapeutic Exercise:  NuStep lvl 5 UE/LE x 5 min while taking subjective LAQ 3x10 5lbs Seated heel slide x 10 - 5" L STS x10 - no UE support - blue chair + airex Step ups x10 fwd 6in Standing heel toe raises 2x15 Standing L hamstring curls 2x10 ea Standing hip abduction 2x10 ea Standing march - 2x10 ea Slant board stretch 45''x3 ea  Neuromuscular re-ed, to improve balance and reduce fall risk: - semi tandem stance 30'' bouts  - semi tandem on foam 30'' bouts (75%)       PT Short Term Goals - 05/04/21 0953       PT SHORT TERM GOAL #1   Title Deborah [name] will be >75% HEP compliant to improve carryover between sessions and facilitate independent management of  condition    Target Date 05/25/21               PT Long Term Goals - 05/04/21 0953       PT LONG TERM GOAL #1   Title Target date for all long term goals:  06/27/20      PT LONG TERM GOAL #2   Title Deborah [name] will improve KOOS, Jr. score from 42 (on evaluation) to 62 as a proxy for functional improvement      PT LONG TERM GOAL #3   Title Deborah [name] will achieve 3 degrees knee extension by D/C (see POC end date) to improve stability in stance phase of gait  EVAL: 7 degrees      PT LONG TERM GOAL #4   Title Deborah [name] will achieve 100 degrees knee flexion by D/C (see POC end date) to improve ability to safely navigate steps in the community  EVAL: 80 degrees      PT LONG TERM GOAL #5   Title Deborah [name] will improve 30'' STS (MCID 2) to >/= 8x (w/ UE?: n) to show improved LE strength and improved transfers  EVAL: 5x  w/ UE? Y      Additional Long Term Goals   Additional Long Term Goals Yes      PT LONG TERM GOAL #6   Title Deborah [name] will be able to put shoes and socks on, not limited by pain  EVAL: limited                   Plan - 05/16/21 1119     Clinical Impression Statement Overall, Deborah [name] is progressing well with therapy.  Pt reports no increase in baseline pain following therapy.  Today we concentrated on lower extremity strengthening, knee strengthening, and balance/proprioception.  Pt continues to report higher than expected pain combined with excellent functional progress.  We will continue gradually increasing load and normalizing gait as tolerted.  Pt will continue to benefit from skilled physical therapy to address remaining deficits and achieve listed goals.  Continue per POC.    PT Treatment/Interventions ADLs/Self Care Home Management;Aquatic Therapy;Electrical Stimulation;Iontophoresis 14m/ml Dexamethasone;Gait training;Therapeutic activities;Therapeutic exercise;Neuromuscular re-education;Manual techniques;Dry needling;Vasopneumatic Device    PT Next Visit Plan progress L knee  strength and ROM as able    PT Home Exercise  Plan [id number]             Patient will benefit from skilled therapeutic intervention in order to improve the following deficits and impairments:  Abnormal gait, Decreased balance, Difficulty walking, Pain, Impaired flexibility, Decreased strength, Decreased range of motion  Visit Diagnosis: Left knee pain, unspecified chronicity  Muscle weakness (generalized)  Other abnormalities of gait and mobility     Problem List Patient Active Problem List   Diagnosis Date Noted   FB bronchus    Status post total left knee replacement 04/01/2021   Primary osteoarthritis of left knee 03/31/2021   Peripheral polyneuropathy 12/12/2020   Status post total right knee replacement 12/12/2019   Primary osteoarthritis of right knee 12/11/2019   Localized swelling, mass and lump, neck 07/20/2019   Allergic rhinitis 05/20/2019   GERD (gastroesophageal reflux disease)    COPD (chronic obstructive pulmonary disease) ([facility])    Chronic diastolic (congestive) heart failure ([place])    Breast cancer ([place]) 07/06/2018   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   Major depressive disorder 04/08/2018   Carpal tunnel syndrome 03/16/2018   Obesity 08/24/2017   Plantar fasciitis 06/06/2016   Low back pain 05/21/2016   Osteoarthritis of knees, bilateral 05/31/2015   Knee pain 01/04/2015   Dysphagia 08/16/2014   Weight gain 08/05/2014   DOE (dyspnea on exertion) 04/28/2014   Frequent falls 12/23/2013   Anemia in chronic illness 08/11/2012   Status post trachelectomy 08/04/2012   Chronic pain 01/20/2012   History of head and neck cancer 09/26/2011   Hx of radiation therapy    Tracheitis 07/17/2011   Seizure ([place]) 07/17/2011   Hypothyroidism 03/19/2011   Essential hypertension 03/19/2011   Larynx cancer ([place]) 07/31/2010    Mathis Dad, PT 05/16/2021, 11:19 AM  [facility] [address] [place], [place], [address] Phone: [phone]   Fax:  [phone]  Name: Deborah [name] MRN: [id number] Date of Birth: 26-Sep-1955

## 2021-05-17 ENCOUNTER — Ambulatory Visit (INDEPENDENT_AMBULATORY_CARE_PROVIDER_SITE_OTHER): Payer: Medicaid Other

## 2021-05-17 ENCOUNTER — Encounter: Payer: Self-pay | Admitting: Pulmonary Disease

## 2021-05-17 ENCOUNTER — Ambulatory Visit: Payer: Medicaid Other | Admitting: Pulmonary Disease

## 2021-05-17 VITALS — BP 128/84 | HR 80 | Ht 67.0 in | Wt 230.0 lb

## 2021-05-17 DIAGNOSIS — J4540 Moderate persistent asthma, uncomplicated: Secondary | ICD-10-CM | POA: Diagnosis not present

## 2021-05-17 DIAGNOSIS — J9811 Atelectasis: Secondary | ICD-10-CM | POA: Diagnosis not present

## 2021-05-17 DIAGNOSIS — R0982 Postnasal drip: Secondary | ICD-10-CM

## 2021-05-17 DIAGNOSIS — T17508D Unspecified foreign body in bronchus causing other injury, subsequent encounter: Secondary | ICD-10-CM | POA: Diagnosis not present

## 2021-05-17 MED ORDER — PREDNISONE 10 MG PO TABS: ORAL_TABLET | ORAL | 0 refills | Status: AC

## 2021-05-17 MED ORDER — IPRATROPIUM BROMIDE 0.03 % NA SOLN: 2.0000 | Freq: Two times a day (BID) | NASAL | 12 refills | Status: DC

## 2021-05-17 MED ORDER — FLUTICASONE PROPIONATE 50 MCG/ACT NA SUSP: 1.0000 | Freq: Every day | NASAL | 2 refills | Status: DC

## 2021-05-17 NOTE — Progress Notes (Signed)
Synopsis: Referred in December 2022 for Foreign Body in Bronchus by [name]  Subjective:   PATIENT ID: Deborah [name] GENDER: female DOB: 1956-03-02, MRN: [id number]  HPI  Chief Complaint  Patient presents with   Consult    Referred by ED for SOB. States she has been dealing with SOB for the past 2 months.    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   Patient seen by her ENT, [name] with CT scan on 11/17 showing obstructing foreign body in the left mainstem bronchus with total post-obstructive atelectasis of lower lung. Appointment setup for today for further evaluation but she became symptomatic and presented to the ED on 11/26. She underwent bronchoscopy with foreign body retrieval with Dr. Valeta Harms on 11/26. 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 She recovered well post-procedure and was then sent home from the ED.   She was seen yesterday by [name] with reported improvement in her breathing but continuing to have cough with mucous production.   She has been doing well since her hospital visit.  She continues to have cough with sputum production.  She also complains of significant postnasal drainage, more so than normal.  She denies any issues with aspiration.  She is being scheduled for esophagram and dilation.  Past Medical History:  Diagnosis Date   Anemia    h/o of   Anxiety    Arthritis    in knees   Asthma    Breast cancer ([place])    Bronchitis    COPD (chronic obstructive pulmonary disease) ([place])    Diverticulosis 11/15/2012   noted on screening colonoscopy    Dyspnea    with COPD exerbation   Esophageal stricture    Former smoker 03/19/2011   GERD (gastroesophageal reflux disease)    Heart murmur    asymptomatic    History of laryngectomy    History of radiation  therapy 11/15/18- 12/06/18   Right Breast total dose 42.56 Gy in 16 fractions.    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   Leukocytopenia    Nausea alone 07/28/2013   Neck pain 01/21/2012   Normal MRI 07/14/2011   negative for mestasis    Pneumonia 2012   Sciatica    Seizures ([place])    07/24/11 off Effexor w/o seizure   Sepsis ([place]) 08/04/2012   Sinusitis, chronic 07/20/2011   Bilateral maxillary, identified on MRI of head 07/14/11.     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[place])      Family History  Problem Relation Age of Onset   Heart disease Mother    Heart disease Father    Heart disease Sister    Cancer Brother        type unknown   Liver disease Maternal Grandmother    Cancer Sister        type unknown   Diabetes Sister    Breast cancer Neg Hx      Social History   Socioeconomic History   Marital status: Divorced    Spouse name: Not on file   Number  of children: 2   Years of education: 12   Highest education level: Not on file  Occupational History   Occupation: part time    Comment: warehouse work  Tobacco Use   Smoking status: Former    Packs/day: 2.00    Years: 20.00    Pack years: 40.00    Types: Cigarettes    Quit date: 09/17/2010    Years since quitting: 10.6   Smokeless tobacco: Never  Vaping Use   Vaping Use: Never used  Substance and Sexual Activity   Alcohol use: Yes    Comment: Socially drinks    Drug use: No    Comment: tried cocaine 1 time 2010 only used 1 time   Sexual activity: Yes    Birth control/protection: Surgical  Other Topics Concern   Not on file  Social History Narrative   Lives with boyfriend   Social Determinants of Health   Financial Resource Strain: Not on file  Food Insecurity: Not on  file  Transportation Needs: Not on file  Physical Activity: Not on file  Stress: Not on file  Social Connections: Not on file  Intimate Partner Violence: Not on file     Allergies  Allergen Reactions   Lisinopril Cough     Outpatient Medications Prior to Visit  Medication Sig Dispense Refill   albuterol (PROAIR HFA) 108 (90 Base) MCG/ACT inhaler Inhale 2 puffs into the lungs every 4 (four) hours as needed for wheezing or shortness of breath. 18 g 11   albuterol (PROVENTIL) (5 MG/ML) 0.5% nebulizer solution Take 0.5 mLs (2.5 mg total) by nebulization every 6 (six) hours as needed for wheezing or shortness of breath. Please give the patient 10 43ml vials. 200 mL 5   amLODipine (NORVASC) 10 MG tablet Take 1 tablet by mouth daily (Patient taking differently: Take 10 mg by mouth every morning.) 90 tablet 3   anastrozole (ARIMIDEX) 1 MG tablet Take 1 tablet (1 mg total) by mouth daily. 90 tablet 3   aspirin EC 81 MG tablet Take 1 tablet (81 mg total) by mouth 2 (two) times daily. To be taken after surgery (Patient taking differently: Take 81 mg by mouth every morning.) 84 tablet 0   celecoxib (CELEBREX) 200 MG capsule Take 1 capsule (200 mg total) by mouth 2 (two) times daily. 30 capsule 3   cetirizine (ZYRTEC) 10 MG tablet Take 1 tablet (10 mg total) by mouth daily. 90 tablet 1   docusate sodium (COLACE) 100 MG capsule Take 1 capsule (100 mg total) by mouth daily as needed. (Patient taking differently: Take 100 mg by mouth daily as needed (constipation).) 30 capsule 2   escitalopram (LEXAPRO) 10 MG tablet Take 1 tablet (10 mg total) by mouth daily. 30 tablet 2   Humidifiers MISC 1 application by Tracheal Tube route as needed. 1 each 0   HYDROcodone-acetaminophen (NORCO) 5-325 MG tablet Take 1 tablet by mouth 2 (two) times daily as needed. (Patient taking differently: Take 1 tablet by mouth 2 (two) times daily as needed (pain).) 20 tablet 0   HYDROcodone-acetaminophen (NORCO) 5-325 MG tablet Take  1-2 tablets by mouth daily as needed. 20 tablet 0   ipratropium-albuterol (DUONEB) 0.5-2.5 (3) MG/3ML SOLN Take 3 mLs by nebulization every 4 (four) hours as needed. 360 mL 1   levothyroxine (SYNTHROID) 200 MCG tablet Take 1 tablet (200 mcg total) by mouth daily before breakfast. 90 tablet 3   losartan-hydrochlorothiazide (HYZAAR) 100-12.5 MG tablet Take 1 tablet by mouth daily.  90 tablet 1   methocarbamol (ROBAXIN) 500 MG tablet Take 1 tablet (500 mg total) by mouth 2 (two) times daily as needed. To be taken after surgery (Patient taking differently: Take 500 mg by mouth 2 (two) times daily as needed for muscle spasms.) 20 tablet 0   methocarbamol (ROBAXIN) 500 MG tablet Take 1 tablet (500 mg total) by mouth every 6 (six) hours as needed for muscle spasms. 30 tablet 2   metoprolol tartrate (LOPRESSOR) 50 MG tablet Take 1 tablet (50 mg total) by mouth 2 (two) times daily. 180 tablet 3   mometasone-formoterol (DULERA) 200-5 MCG/ACT AERO Inhale 2 puffs into the lungs 2 (two) times daily. 13 g 5   nystatin cream (MYCOSTATIN) APPLY  CREAM TOPICALLY TWICE DAILY AS NEEDED (Patient taking differently: Apply 1 application topically daily as needed for dry skin.) 30 g 0   omeprazole (PRILOSEC) 40 MG capsule Take 1 capsule (40 mg total) by mouth 2 (two) times daily. 90 capsule 0   ondansetron (ZOFRAN) 4 MG tablet Take 1 tablet (4 mg total) by mouth every 8 (eight) hours as needed for nausea or vomiting. 40 tablet 0   Tiotropium Bromide Monohydrate (SPIRIVA RESPIMAT) 2.5 MCG/ACT AERS Inhale 2 puffs into the lungs daily. 12 g 2   No facility-administered medications prior to visit.   Review of Systems  Constitutional:  Negative for chills, fever, malaise/fatigue and weight loss.  HENT:  Negative for congestion, sinus pain and sore throat.   Eyes: Negative.   Respiratory:  Negative for cough, hemoptysis, sputum production, shortness of breath and wheezing.   Cardiovascular:  Negative for chest pain,  palpitations, orthopnea, claudication and leg swelling.  Gastrointestinal:  Negative for abdominal pain, heartburn, nausea and vomiting.  Genitourinary: Negative.   Musculoskeletal:  Negative for joint pain and myalgias.  Skin:  Negative for rash.  Neurological:  Negative for weakness.  Endo/Heme/Allergies: Negative.   Psychiatric/Behavioral: Negative.     Objective:   Vitals:   05/17/21 0901  BP: 128/84  Pulse: 80  SpO2: 98%  Weight: 230 lb (104.3 kg)  Height: 5\' 7"  (1.702 m)    Physical Exam Constitutional:      General: She is not in acute distress.    Appearance: She is not ill-appearing.  HENT:     Head: Normocephalic and atraumatic.  Eyes:     General: No scleral icterus.    Conjunctiva/sclera: Conjunctivae normal.     Pupils: Pupils are equal, round, and reactive to light.  Cardiovascular:     Rate and Rhythm: Normal rate and regular rhythm.     Pulses: Normal pulses.     Heart sounds: Normal heart sounds. No murmur heard. Pulmonary:     Effort: Pulmonary effort is normal.     Breath sounds: Normal breath sounds. No wheezing, rhonchi or rales.  Abdominal:     General: Bowel sounds are normal.     Palpations: Abdomen is soft.  Musculoskeletal:     Right lower leg: No edema.     Left lower leg: No edema.  Lymphadenopathy:     Cervical: No cervical adenopathy.  Skin:    General: Skin is warm and dry.  Neurological:     General: No focal deficit present.     Mental Status: She is alert.  Psychiatric:        Mood and Affect: Mood normal.        Behavior: Behavior normal.        Thought Content: Thought content  normal.        Judgment: Judgment normal.    CBC    Component Value Date/Time   WBC 5.8 05/11/2021 0907   RBC 3.99 05/11/2021 0907   HGB 12.0 05/11/2021 0907   HGB 11.7 (L) 06/02/2018 1208   HGB 11.9 08/20/2015 1200   HCT 37.4 05/11/2021 0907   HCT 37.3 08/20/2015 1200   PLT 395 05/11/2021 0907   PLT 359 06/02/2018 1208   PLT 305 08/20/2015  1200   MCV 93.7 05/11/2021 0907   MCV 91.8 08/20/2015 1200   MCH 30.1 05/11/2021 0907   MCHC 32.1 05/11/2021 0907   RDW 13.3 05/11/2021 0907   RDW 14.4 08/20/2015 1200   LYMPHSABS 1.8 05/11/2021 0907   LYMPHSABS 1.2 08/20/2015 1200   MONOABS 0.5 05/11/2021 0907   MONOABS 0.3 08/20/2015 1200   EOSABS 0.4 05/11/2021 0907   EOSABS 0.2 08/20/2015 1200   BASOSABS 0.1 05/11/2021 0907   BASOSABS 0.1 08/20/2015 1200   Chest imaging: CT Chest 05/02/21 1. There is an obstructing foreign body at the origin of the left mainstem bronchus, with total postobstructive atelectasis of the left lower lobe. This may be an aspirated tracheoesophageal speech valve. 2. Tracheostomy. 3. Hepatic steatosis.  PFT: No flowsheet data found.  Labs:  Path:  Echo 08/31/17: LVEF 65 to 70%.  Grade 2 diastolic dysfunction.  Normal size and function.  Heart Catheterization:       Assessment & Plan:   Foreign body in main bronchus, subsequent encounter - Plan: DG Chest 2 View  Moderate persistent asthma without complication - Plan: predniSONE (DELTASONE) 10 MG tablet  Post-nasal drainage - Plan: fluticasone (FLONASE) 50 MCG/ACT nasal spray, ipratropium (ATROVENT) 0.03 % nasal spray, predniSONE (DELTASONE) 10 MG tablet  Discussion: Deborah [name] is a 65 year old woman, former smoker with laryngeal cancer s/p laryngectomy who is referred to pulmonary clinic for hospital follow up after retrieval of foreign body from her left main-stem bronchus.   We will check chest radiograph today for postprocedure follow-up.  She appears to have significant postnasal drainage that could be leading to her ongoing cough and congestion but it is also likely she continues to have irritation of the left mainstem bronchus and airways as she is recovering from her foreign body retrieval.  She is to continue Dulera and Spiriva inhaler therapy for her asthma.  She is to increase her DuoNeb nebulizer treatments to aid in  airway clearance.  We are unable to do flutter valve therapy.  We will start her on prednisone taper for her sinus congestion and airway congestion.  She is to start fluticasone nasal spray daily and ipratropium nasal spray twice daily for her postnasal drainage.  She is to continue Zyrtec daily for allergies.  Follow-up in 3 months.  Freda Jackson, MD [facility] Office: [phone]    Current Outpatient Medications:    albuterol (PROAIR HFA) 108 (90 Base) MCG/ACT inhaler, Inhale 2 puffs into the lungs every 4 (four) hours as needed for wheezing or shortness of breath., Disp: 18 g, Rfl: 11   albuterol (PROVENTIL) (5 MG/ML) 0.5% nebulizer solution, Take 0.5 mLs (2.5 mg total) by nebulization every 6 (six) hours as needed for wheezing or shortness of breath. Please give the patient 10 37ml vials., Disp: 200 mL, Rfl: 5   amLODipine (NORVASC) 10 MG tablet, Take 1 tablet by mouth daily (Patient taking differently: Take 10 mg by mouth every morning.), Disp: 90 tablet, Rfl: 3   anastrozole (  ARIMIDEX) 1 MG tablet, Take 1 tablet (1 mg total) by mouth daily., Disp: 90 tablet, Rfl: 3   aspirin EC 81 MG tablet, Take 1 tablet (81 mg total) by mouth 2 (two) times daily. To be taken after surgery (Patient taking differently: Take 81 mg by mouth every morning.), Disp: 84 tablet, Rfl: 0   celecoxib (CELEBREX) 200 MG capsule, Take 1 capsule (200 mg total) by mouth 2 (two) times daily., Disp: 30 capsule, Rfl: 3   cetirizine (ZYRTEC) 10 MG tablet, Take 1 tablet (10 mg total) by mouth daily., Disp: 90 tablet, Rfl: 1   docusate sodium (COLACE) 100 MG capsule, Take 1 capsule (100 mg total) by mouth daily as needed. (Patient taking differently: Take 100 mg by mouth daily as needed (constipation).), Disp: 30 capsule, Rfl: 2   escitalopram (LEXAPRO) 10 MG tablet, Take 1 tablet (10 mg total) by mouth daily., Disp: 30 tablet, Rfl: 2   fluticasone (FLONASE) 50 MCG/ACT nasal spray, Place 1 spray  into both nostrils daily., Disp: 16 g, Rfl: 2   Humidifiers MISC, 1 application by Tracheal Tube route as needed., Disp: 1 each, Rfl: 0   HYDROcodone-acetaminophen (NORCO) 5-325 MG tablet, Take 1 tablet by mouth 2 (two) times daily as needed. (Patient taking differently: Take 1 tablet by mouth 2 (two) times daily as needed (pain).), Disp: 20 tablet, Rfl: 0   HYDROcodone-acetaminophen (NORCO) 5-325 MG tablet, Take 1-2 tablets by mouth daily as needed., Disp: 20 tablet, Rfl: 0   ipratropium (ATROVENT) 0.03 % nasal spray, Place 2 sprays into both nostrils every 12 (twelve) hours., Disp: 30 mL, Rfl: 12   ipratropium-albuterol (DUONEB) 0.5-2.5 (3) MG/3ML SOLN, Take 3 mLs by nebulization every 4 (four) hours as needed., Disp: 360 mL, Rfl: 1   levothyroxine (SYNTHROID) 200 MCG tablet, Take 1 tablet (200 mcg total) by mouth daily before breakfast., Disp: 90 tablet, Rfl: 3   losartan-hydrochlorothiazide (HYZAAR) 100-12.5 MG tablet, Take 1 tablet by mouth daily., Disp: 90 tablet, Rfl: 1   methocarbamol (ROBAXIN) 500 MG tablet, Take 1 tablet (500 mg total) by mouth 2 (two) times daily as needed. To be taken after surgery (Patient taking differently: Take 500 mg by mouth 2 (two) times daily as needed for muscle spasms.), Disp: 20 tablet, Rfl: 0   methocarbamol (ROBAXIN) 500 MG tablet, Take 1 tablet (500 mg total) by mouth every 6 (six) hours as needed for muscle spasms., Disp: 30 tablet, Rfl: 2   metoprolol tartrate (LOPRESSOR) 50 MG tablet, Take 1 tablet (50 mg total) by mouth 2 (two) times daily., Disp: 180 tablet, Rfl: 3   mometasone-formoterol (DULERA) 200-5 MCG/ACT AERO, Inhale 2 puffs into the lungs 2 (two) times daily., Disp: 13 g, Rfl: 5   nystatin cream (MYCOSTATIN), APPLY  CREAM TOPICALLY TWICE DAILY AS NEEDED (Patient taking differently: Apply 1 application topically daily as needed for dry skin.), Disp: 30 g, Rfl: 0   omeprazole (PRILOSEC) 40 MG capsule, Take 1 capsule (40 mg total) by mouth 2 (two)  times daily., Disp: 90 capsule, Rfl: 0   ondansetron (ZOFRAN) 4 MG tablet, Take 1 tablet (4 mg total) by mouth every 8 (eight) hours as needed for nausea or vomiting., Disp: 40 tablet, Rfl: 0   predniSONE (DELTASONE) 10 MG tablet, Take 4 tablets (40 mg total) by mouth daily with breakfast for 3 days, THEN 3 tablets (30 mg total) daily with breakfast for 3 days, THEN 2 tablets (20 mg total) daily with breakfast for 3 days, THEN 1  tablet (10 mg total) daily with breakfast for 3 days., Disp: 30 tablet, Rfl: 0   Tiotropium Bromide Monohydrate (SPIRIVA RESPIMAT) 2.5 MCG/ACT AERS, Inhale 2 puffs into the lungs daily., Disp: 12 g, Rfl: 2

## 2021-05-17 NOTE — Patient Instructions (Addendum)
Increase nebulizer treatments to 3 times per day to aid in mucous clearance.   Start fluticasone nasal spray, 1 spray per nostril daily  Start ipratropium nasal spray 2 sprays per nostril, twice daily  Continue on dulera and spiriva inhaler  Continue Zyrtec Daily for allergies.

## 2021-05-21 ENCOUNTER — Other Ambulatory Visit: Payer: Self-pay

## 2021-05-21 ENCOUNTER — Ambulatory Visit: Payer: Medicaid Other

## 2021-05-21 DIAGNOSIS — M25562 Pain in left knee: Secondary | ICD-10-CM

## 2021-05-21 DIAGNOSIS — R2689 Other abnormalities of gait and mobility: Secondary | ICD-10-CM

## 2021-05-21 DIAGNOSIS — M6281 Muscle weakness (generalized): Secondary | ICD-10-CM

## 2021-05-21 NOTE — Therapy (Signed)
[place], [place], [address] Phone: [phone]   Fax:  [phone]  Physical Therapy Treatment  Patient Details  Name: Deborah [name] MRN: [id number] Date of Birth: [birth year] Referring Provider (PT): Leandrew Koyanagi, MD   Encounter Date: 05/21/2021   PT End of Session - 05/21/21 0912     Visit Number 5    Number of Visits 16    Date for PT Re-Evaluation 06/29/21    Authorization Type MCD - [employer]    PT Start Time 0915    PT Stop Time 0955    PT Time Calculation (min) 40 min             Past Medical History:  Diagnosis Date   Anemia    h/o of   Anxiety    Arthritis    in knees   Asthma    Breast cancer ([place])    Bronchitis    COPD (chronic obstructive pulmonary disease) ([place])    Diverticulosis 11/15/2012   noted on screening colonoscopy    Dyspnea    with COPD exerbation   Esophageal stricture    Former smoker 03/19/2011   GERD (gastroesophageal reflux disease)    Heart murmur    asymptomatic    History of laryngectomy    History of radiation therapy 11/15/18- 12/06/18   Right Breast total dose 42.56 Gy in 16 fractions.    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   Leukocytopenia    Nausea alone 07/28/2013   Neck pain 01/21/2012   Normal MRI 07/14/2011   negative for mestasis    Pneumonia 2012   Sciatica    Seizures ([place])    07/24/11 off Effexor w/o seizure   Sepsis ([place]) 08/04/2012   Sinusitis, chronic 07/20/2011   Bilateral maxillary, identified on MRI of head 07/14/11.     Tracheostomy dependent [facility])     Past Surgical History:  Procedure Laterality Date   BREAST LUMPECTOMY Right 07/06/2018   BREAST LUMPECTOMY WITH  RADIOACTIVE SEED LOCALIZATION Right 07/06/2018   Procedure: RIGHT BREAST LUMPECTOMY WITH RADIOACTIVE SEED LOCALIZATION;  Surgeon: Rolm Bookbinder, MD;  Location: [place];  Service: General;  Laterality: Right;   COLONOSCOPY N/A 11/15/2012   Procedure: COLONOSCOPY;  Surgeon: [place] Dragon, MD;  [facility];  Service: Endoscopy;  Laterality: N/A;   CRYOTHERAPY  05/11/2021   Procedure: CRYOTHERAPY;  Surgeon: Garner Nash, DO;  Location: [place] [facility];  Service: Pulmonary;;   DENTAL RESTORATION/EXTRACTION WITH X-RAY     ESOPHAGEAL DILATION N/A 09/09/2019   Procedure: ESOPHAGEAL DILATION;  Surgeon: [name], MD;  Location: [place];  Service: ENT;  Laterality: N/A;   ESOPHAGEAL DILATION N/A 10/05/2019   Procedure: ESOPHAGEAL DILATION;  Surgeon: [name], MD;  Location: [place];  Service: ENT;  Laterality: N/A;  via tracheostomy   ESOPHAGEAL DILATION N/A 02/27/2020   Procedure: ESOPHAGEAL DILATION;  Surgeon: [name], MD;  Location: [place];  Service: ENT;  Laterality: N/A;   ESOPHAGOGASTRODUODENOSCOPY (EGD) WITH PROPOFOL N/A 08/12/2018   Procedure: ESOPHAGOGASTRODUODENOSCOPY (EGD) WITH PROPOFOL;  Surgeon: Doran Stabler, MD;  Location: [place];  Service: Gastroenterology;  Laterality: N/A;  ESOPHAGOSCOPY  06/21/2012   Procedure: ESOPHAGOSCOPY;  Surgeon: [name], MD;  Location: [place];  Service: ENT;  Laterality: N/A;   ESOPHAGOSCOPY WITH DILITATION N/A 09/21/2014   Procedure: ESOPHAGOSCOPY WITH DILITATION;  Surgeon: [name], MD;  Location: [place];  Service: ENT;  Laterality: N/A;   ESOPHAGOSCOPY WITH DILITATION N/A 07/04/2016   Procedure: ESOPHAGOSCOPY WITH DILITATION;  Surgeon: [name], MD;  Location: [place];  Service: ENT;  Laterality: N/A;   ESOPHAGOSCOPY WITH DILITATION N/A 12/01/2017   Procedure: ESOPHAGOSCOPY WITH DILITATION;  Surgeon: [name], MD;  Location: [place];  Service: ENT;   Laterality: N/A;   ESOPHAGOSCOPY WITH DILITATION N/A 04/19/2018   Procedure: ESOPHAGOSCOPY WITH DILITATION;  Surgeon: [name], MD;  Location: [place];  Service: ENT;  Laterality: N/A;   ESOPHAGOSCOPY WITH DILITATION N/A 03/07/2019   Procedure: Esophagoscopy with dilatation;  Surgeon: [name], MD;  Location: [place];  Service: ENT;  Laterality: N/A;   ESOPHAGOSCOPY WITH DILITATION N/A 07/16/2020   Procedure: ESOPHAGOSCOPY WITH DILATION;  Surgeon: [name], MD;  Location: [place];  Service: ENT;  Laterality: N/A;   [place] N/A 10/08/2020   Procedure: ESOPHAGOSCOPY With Dilation;  Surgeon: [name], MD;  Location: [place];  Service: ENT;  Laterality: N/A;  21 French to [place]  01/08/2018       FOREIGN BODY REMOVAL  05/11/2021   Procedure: FOREIGN BODY REMOVAL;  Surgeon: Garner Nash, DO;  Location: [place];  Service: Pulmonary;;   FOREIGN BODY REMOVAL BRONCHIAL  10/02/2011   Procedure: REMOVAL FOREIGN BODY BRONCHIAL;  Surgeon: [place] Cola, MD;  Location: [place];  Service: ENT;  Laterality: N/A;   FOREIGN BODY REMOVAL BRONCHIAL N/A 01/08/2018   Procedure: REMOVAL FOREIGN BODY BRONCHIAL;  Surgeon: Jodi Marble, MD;  [facility];  Service: ENT;  Laterality: N/A;   HEMOSTASIS CONTROL  05/11/2021   Procedure: HEMOSTASIS CONTROL;  Surgeon: Garner Nash, DO;  Location: [place];  Service: Pulmonary;;   LARYNGECTOMY     Porta cath removal     PORTACATH PLACEMENT  09/17/10   Tip in cavoatrial junction   RE-EXCISION OF BREAST CANCER,SUPERIOR MARGINS Right 07/29/2018   Procedure: RE-EXCISION OF RIGHT BREAST MEDIAL MARGINS;  Surgeon: Rolm Bookbinder, MD;  Location: [place];  Service: General;  Laterality: Right;   RIGID ESOPHAGOSCOPY N/A 03/19/2019   Procedure: FLEXIBLE ESOPHAGOSCOPY;  Surgeon: Jodi Marble, MD;  Location: [place];  Service: ENT;  Laterality: N/A;    STOMAPLASTY N/A 10/21/2016   Procedure: Zola Button;  Surgeon: [name], MD;  Location: [place];  Service: ENT;  Laterality: N/A;   TOTAL KNEE ARTHROPLASTY Right 12/12/2019   Procedure: RIGHT TOTAL KNEE ARTHROPLASTY;  Surgeon: Leandrew Koyanagi, MD;  Location: [place];  Service: Orthopedics;  Laterality: Right;   TOTAL KNEE ARTHROPLASTY Left 04/01/2021   Procedure: LEFT TOTAL KNEE ARTHROPLASTY;  Surgeon: Leandrew Koyanagi, MD;  Location: [place];  Service: Orthopedics;  Laterality: Left;   TRACHEAL DILITATION  07/16/2011   Procedure: TRACHEAL DILITATION;  Surgeon: Beckie Salts, MD;  Location: [place];  Service: ENT;  Laterality: N/A;  dilation of tracheal stoma and replacement of stoma tube   [place] Bilateral 05/11/2021   Procedure: VIDEO BRONCHOSCOPY WITHOUT FLUORO;  Surgeon: Garner Nash, DO;  Location: [place];  Service: Pulmonary;  Laterality: Bilateral;  w/ cryotherapy, Foreign body extraction  There were no vitals filed for this visit.   Subjective Assessment - 05/21/21 0912     Subjective Pt reports continued pain in L knee, but notes it is improving. Has been compliant with her HEP since last session with no adverse effect. Pt is ready to begin PT at this time.    Currently in Pain? Yes    Pain Score 7     Pain Location Knee    Pain Orientation Left           [facility] Adult PT Treatment/Exercise:   Therapeutic Exercise:  NuStep lvl 5 UE/LE x 5 min while taking subjective LAQ 3x10 5lbs Hamstring curl 2x10 L BTB Seated heel slide x 10 - 5" L Seated quad set/hamstring stretch x 10 - 5" L STS 2x10 - no UE support Step ups x10 fwd 8in Standing heel toe raises x 20 Standing L hamstring curls 2x10 ea Standing hip abduction 2x10 ea Standing march - 2x10 ea Slant board stretch 45''x3 ea  Manual Therapy: N/A   Neuromuscular re-ed: Semi tandem stance 30'' bouts  Tandem stance x 30" ea   Therapeutic Activity: N/A   Modalities: N/A   Self  Care: N/A   Consider / progression for next session:                                PT Short Term Goals - 05/04/21 0953       PT SHORT TERM GOAL #1   Title [facility] will be >75% HEP compliant to improve carryover between sessions and facilitate independent management of condition    Target Date 05/25/21               PT Long Term Goals - 05/04/21 0953       PT LONG TERM GOAL #1   Title Target date for all long term goals:  06/27/20      PT LONG TERM GOAL #2   Title [place] [facility] will improve KOOS, Jr. score from 42 (on evaluation) to 62 as a proxy for functional improvement      PT LONG TERM GOAL #3   Title Shinita will achieve 3 degrees knee extension by D/C (see POC end date) to improve stability in stance phase of gait  EVAL: 7 degrees      PT LONG TERM GOAL #4   Title Alila will achieve 100 degrees knee flexion by D/C (see POC end date) to improve ability to safely navigate steps in the community  EVAL: 80 degrees      PT LONG TERM GOAL #5   Title Evangela will improve 30'' STS (MCID 2) to >/= 8x (w/ UE?: n) to show improved LE strength and improved transfers  EVAL: 5x  w/ UE? Y      Additional Long Term Goals   Additional Long Term Goals Yes      PT LONG TERM GOAL #6   Title Cassy will be able to put shoes and socks on, not limited by pain  EVAL: limited                   Plan - 05/21/21 0937     Clinical Impression Statement Pt was again able to complete all prescribed exercises with no adverse effect or increase in pain. She is progressing well with therapy, showing improving strength, mobility, R knee ROM, and activity tolerance. PT will continue to progress exercises as prescribed per  POC.    PT Treatment/Interventions ADLs/Self Care Home Management;Aquatic Therapy;Electrical Stimulation;Iontophoresis 51m/ml Dexamethasone;Gait training;Therapeutic activities;Therapeutic exercise;Neuromuscular re-education;Manual techniques;Dry  needling;Vasopneumatic Device    PT Next Visit Plan progress L knee strength and ROM as able    PT [facility]            Patient will benefit from skilled therapeutic intervention in order to improve the following deficits and impairments:  Abnormal gait, Decreased balance, Difficulty walking, Pain, Impaired flexibility, Decreased strength, Decreased range of motion  Visit Diagnosis: Left knee pain, unspecified chronicity  Muscle weakness (generalized)  Other abnormalities of gait and mobility     Problem List Patient Active Problem List   Diagnosis Date Noted   FB bronchus    Status post total left knee replacement 04/01/2021   Primary osteoarthritis of left knee 03/31/2021   Peripheral polyneuropathy 12/12/2020   Status post total right knee replacement 12/12/2019   Primary osteoarthritis of right knee 12/11/2019   Localized swelling, mass and lump, neck 07/20/2019   Allergic rhinitis 05/20/2019   GERD (gastroesophageal reflux disease)    COPD (chronic obstructive pulmonary disease) ([facility])    Chronic diastolic (congestive) heart failure ([place]    Breast cancer ([place] 07/06/2018   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   Major depressive disorder 04/08/2018   Carpal tunnel syndrome 03/16/2018   Obesity 08/24/2017   Plantar fasciitis 06/06/2016   Low back pain 05/21/2016   Osteoarthritis of knees, bilateral 05/31/2015   Knee pain 01/04/2015   Dysphagia 08/16/2014   Weight gain 08/05/2014   DOE (dyspnea on exertion) 04/28/2014   Frequent falls 12/23/2013   Anemia in chronic illness 08/11/2012   Status post trachelectomy 08/04/2012   Chronic pain 01/20/2012   History of head and neck cancer 09/26/2011   Hx of radiation therapy    Tracheitis 07/17/2011   Seizure ([place] 07/17/2011   Hypothyroidism 03/19/2011   Essential hypertension 03/19/2011   Larynx cancer ([place] 07/31/2010    DWard Chatters PT 05/21/2021, 9:55 AM  [facility]GWelcome N[place]aska [address]Phone: [phone]  Fax:  [phone] Name: MMozelle RemlingerMRN: [id number] of Birth: 710/13/1957

## 2021-05-23 ENCOUNTER — Other Ambulatory Visit: Payer: Self-pay

## 2021-05-23 ENCOUNTER — Ambulatory Visit: Payer: Medicaid Other

## 2021-05-23 DIAGNOSIS — Z930 Tracheostomy status: Secondary | ICD-10-CM | POA: Diagnosis not present

## 2021-05-23 DIAGNOSIS — C329 Malignant neoplasm of larynx, unspecified: Secondary | ICD-10-CM | POA: Diagnosis not present

## 2021-05-23 DIAGNOSIS — M25562 Pain in left knee: Secondary | ICD-10-CM

## 2021-05-23 DIAGNOSIS — R2689 Other abnormalities of gait and mobility: Secondary | ICD-10-CM

## 2021-05-23 DIAGNOSIS — M6281 Muscle weakness (generalized): Secondary | ICD-10-CM

## 2021-05-23 DIAGNOSIS — C321 Malignant neoplasm of supraglottis: Secondary | ICD-10-CM | POA: Diagnosis not present

## 2021-05-23 NOTE — Therapy (Signed)
[place], [place], [address] Phone: [phone]   Fax:  [phone]  Physical Therapy Treatment  Patient Details  Name: Deborah [name] MRN: [id number] Date of Birth: May 25, 1956 Referring Provider (PT): Leandrew Koyanagi, MD   Encounter Date: 05/23/2021   PT End of Session - 05/23/21 0946     Visit Number 6    Number of Visits 16    Date for PT Re-Evaluation 06/29/21    Authorization Type MCD - [employer]    PT Start Time 0946    PT Stop Time 1024    PT Time Calculation (min) 38 min             Past Medical History:  Diagnosis Date   Anemia    h/o of   Anxiety    Arthritis    in knees   Asthma    Breast cancer ([place])    Bronchitis    COPD (chronic obstructive pulmonary disease) ([place])    Diverticulosis 11/15/2012   noted on screening colonoscopy    Dyspnea    with COPD exerbation   Esophageal stricture    Former smoker 03/19/2011   GERD (gastroesophageal reflux disease)    Heart murmur    asymptomatic    History of laryngectomy    History of radiation therapy 11/15/18- 12/06/18   Right Breast total dose 42.56 Gy in 16 fractions.    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   Leukocytopenia    Nausea alone 07/28/2013   Neck pain 01/21/2012   Normal MRI 07/14/2011   negative for mestasis    Pneumonia 2012   Sciatica    Seizures ([place])    07/24/11 off Effexor w/o seizure   Sepsis ([place]) 08/04/2012   Sinusitis, chronic 07/20/2011   Bilateral maxillary, identified on MRI of head 07/14/11.     Tracheostomy dependent [facility])     Past Surgical History:  Procedure Laterality Date   BREAST LUMPECTOMY Right 07/06/2018   BREAST LUMPECTOMY WITH  RADIOACTIVE SEED LOCALIZATION Right 07/06/2018   Procedure: RIGHT BREAST LUMPECTOMY WITH RADIOACTIVE SEED LOCALIZATION;  Surgeon: Rolm Bookbinder, MD;  Location: [place];  Service: General;  Laterality: Right;   COLONOSCOPY N/A 11/15/2012   Procedure: COLONOSCOPY;  Surgeon: [place] Dragon, MD;  [facility];  Service: Endoscopy;  Laterality: N/A;   CRYOTHERAPY  05/11/2021   Procedure: CRYOTHERAPY;  Surgeon: Garner Nash, DO;  Location: [place] [facility]COPY;  Service: Pulmonary;;   DENTAL RESTORATION/EXTRACTION WITH X-RAY     ESOPHAGEAL DILATION N/A 09/09/2019   Procedure: ESOPHAGEAL DILATION;  Surgeon: [name], MD;  Location: [place];  Service: ENT;  Laterality: N/A;   ESOPHAGEAL DILATION N/A 10/05/2019   Procedure: ESOPHAGEAL DILATION;  Surgeon: [name], MD;  Location: [place];  Service: ENT;  Laterality: N/A;  via tracheostomy   ESOPHAGEAL DILATION N/A 02/27/2020   Procedure: ESOPHAGEAL DILATION;  Surgeon: [name], MD;  Location: [place];  Service: ENT;  Laterality: N/A;   ESOPHAGOGASTRODUODENOSCOPY (EGD) WITH PROPOFOL N/A 08/12/2018   Procedure: ESOPHAGOGASTRODUODENOSCOPY (EGD) WITH PROPOFOL;  Surgeon: Doran Stabler, MD;  Location: [place];  Service: Gastroenterology;  Laterality: N/A;  ESOPHAGOSCOPY  06/21/2012   Procedure: ESOPHAGOSCOPY;  Surgeon: [name], MD;  Location: [place];  Service: ENT;  Laterality: N/A;   ESOPHAGOSCOPY WITH DILITATION N/A 09/21/2014   Procedure: ESOPHAGOSCOPY WITH DILITATION;  Surgeon: [name], MD;  Location: [place];  Service: ENT;  Laterality: N/A;   ESOPHAGOSCOPY WITH DILITATION N/A 07/04/2016   Procedure: ESOPHAGOSCOPY WITH DILITATION;  Surgeon: [name], MD;  Location: [place];  Service: ENT;  Laterality: N/A;   ESOPHAGOSCOPY WITH DILITATION N/A 12/01/2017   Procedure: ESOPHAGOSCOPY WITH DILITATION;  Surgeon: [name], MD;  Location: [place];  Service: ENT;   Laterality: N/A;   ESOPHAGOSCOPY WITH DILITATION N/A 04/19/2018   Procedure: ESOPHAGOSCOPY WITH DILITATION;  Surgeon: [name], MD;  Location: [place];  Service: ENT;  Laterality: N/A;   ESOPHAGOSCOPY WITH DILITATION N/A 03/07/2019   Procedure: Esophagoscopy with dilatation;  Surgeon: [name], MD;  Location: [place];  Service: ENT;  Laterality: N/A;   ESOPHAGOSCOPY WITH DILITATION N/A 07/16/2020   Procedure: ESOPHAGOSCOPY WITH DILATION;  Surgeon: [name], MD;  Location: [place];  Service: ENT;  Laterality: N/A;   [place] N/A 10/08/2020   Procedure: ESOPHAGOSCOPY With Dilation;  Surgeon: [name], MD;  Location: [place];  Service: ENT;  Laterality: N/A;  21 [place] to [place]  01/08/2018       FOREIGN BODY REMOVAL  05/11/2021   Procedure: FOREIGN BODY REMOVAL;  Surgeon: Garner Nash, DO;  Location: [place];  Service: Pulmonary;;   FOREIGN BODY REMOVAL BRONCHIAL  10/02/2011   Procedure: REMOVAL FOREIGN BODY BRONCHIAL;  Surgeon: [place] Cola, MD;  Location: [place];  Service: ENT;  Laterality: N/A;   FOREIGN BODY REMOVAL BRONCHIAL N/A 01/08/2018   Procedure: REMOVAL FOREIGN BODY BRONCHIAL;  Surgeon: Jodi Marble, MD;  [facility];  Service: ENT;  Laterality: N/A;   HEMOSTASIS CONTROL  05/11/2021   Procedure: HEMOSTASIS CONTROL;  Surgeon: Garner Nash, DO;  Location: [place];  Service: Pulmonary;;   LARYNGECTOMY     Porta cath removal     PORTACATH PLACEMENT  09/17/10   Tip in cavoatrial junction   RE-EXCISION OF BREAST CANCER,SUPERIOR MARGINS Right 07/29/2018   Procedure: RE-EXCISION OF RIGHT BREAST MEDIAL MARGINS;  Surgeon: Rolm Bookbinder, MD;  Location: [place];  Service: General;  Laterality: Right;   RIGID ESOPHAGOSCOPY N/A 03/19/2019   Procedure: FLEXIBLE ESOPHAGOSCOPY;  Surgeon: Jodi Marble, MD;  Location: [place];  Service: ENT;  Laterality: N/A;    STOMAPLASTY N/A 10/21/2016   Procedure: Zola Button;  Surgeon: [name], MD;  Location: [place];  Service: ENT;  Laterality: N/A;   TOTAL KNEE ARTHROPLASTY Right 12/12/2019   Procedure: RIGHT TOTAL KNEE ARTHROPLASTY;  Surgeon: Leandrew Koyanagi, MD;  Location: [place];  Service: Orthopedics;  Laterality: Right;   TOTAL KNEE ARTHROPLASTY Left 04/01/2021   Procedure: LEFT TOTAL KNEE ARTHROPLASTY;  Surgeon: Leandrew Koyanagi, MD;  Location: [place];  Service: Orthopedics;  Laterality: Left;   TRACHEAL DILITATION  07/16/2011   Procedure: TRACHEAL DILITATION;  Surgeon: Beckie Salts, MD;  Location[place];  Service: ENT;  Laterality: N/A;  dilation of tracheal stoma and replacement of stoma tube   [place] Bilateral 05/11/2021   Procedure: VIDEO BRONCHOSCOPY WITHOUT FLUORO;  Surgeon: Garner Nash, DO;  Location: [place];  Service: Pulmonary;  Laterality: Bilateral;  w/ cryotherapy, Foreign body extraction  There were no vitals filed for this visit.   Subjective Assessment - 05/23/21 0946     Subjective Pt presents to PT with continued L knee pain and discomfort. She has been compliant with HEP with no adverse effect. Pt is ready to begin PT at this time.    Currently in Pain? Yes    Pain Score 10-Worst pain ever    Pain Location Knee    Pain Orientation Left           [facility] Adult PT Treatment/Exercise:   Therapeutic Exercise:  NuStep lvl 5 UE/LE x 5 min while taking subjective TG knee ext 2x10 5lbs TG hamstring curl 2x10 15lbs Seated heel slide x 10 - 5" L Seated quad set/hamstring stretch x 10 - 5" L STS 2x10 - no UE support Step ups x10 fwd 8in Standing L hamstring curls 2x10 2lb Standing hip abduction 2x10 ea 2lb Standing march - 2x10 ea Slant board stretch 45''x 2   Manual Therapy: N/A   Neuromuscular re-ed: N/A   Therapeutic Activity: N/A   Modalities: N/A   Self Care: N/A   Consider / progression for next session:      [facility] PT  Assessment - 05/23/21 0001       AROM   Left Knee Flexion 111                                      PT Short Term Goals - 05/04/21 0953       PT SHORT TERM GOAL #1   Title Deborah [name] will be >75% HEP compliant to improve carryover between sessions and facilitate independent management of condition    Target Date 05/25/21               PT Long Term Goals - 05/04/21 0953       PT LONG TERM GOAL #1   Title Target date for all long term goals:  06/27/20      PT LONG TERM GOAL #2   Title Deborah [name] will improve KOOS, Jr. score from 42 (on evaluation) to 62 as a proxy for functional improvement      PT LONG TERM GOAL #3   Title Deborah [name] will achieve 3 degrees knee extension by D/C (see POC end date) to improve stability in stance phase of gait  EVAL: 7 degrees      PT LONG TERM GOAL #4   Title Deborah [name] will achieve 100 degrees knee flexion by D/C (see POC end date) to improve ability to safely navigate steps in the community  EVAL: 80 degrees      PT LONG TERM GOAL #5   Title Deborah [name] will improve 30'' STS (MCID 2) to >/= 8x (w/ UE?: n) to show improved LE strength and improved transfers  EVAL: 5x  w/ UE? Y      Additional Long Term Goals   Additional Long Term Goals Yes      PT LONG TERM GOAL #6   Title Deborah [name] will be able to put shoes and socks on, not limited by pain  EVAL: limited                   Plan - 05/23/21 1010     Clinical Impression Statement Pt was able to complete prescribed exercises with no adverse effect or change in baseline. She demonstrates continued improvement and progression with therapy, showing improving LE strength,  L knee ROM, and general increase in functional mobility. PT will continue to progress exercises as tolerated per POC.    PT Treatment/Interventions ADLs/Self Care Home Management;Aquatic Therapy;Electrical Stimulation;Iontophoresis 35m/ml Dexamethasone;Gait training;Therapeutic activities;Therapeutic  exercise;Neuromuscular re-education;Manual techniques;Dry needling;Vasopneumatic Device    PT Next Visit Plan progress L knee strength and ROM as able    [facility]            Patient will benefit from skilled therapeutic intervention in order to improve the following deficits and impairments:  Abnormal gait, Decreased balance, Difficulty walking, Pain, Impaired flexibility, Decreased strength, Decreased range of motion  Visit Diagnosis: Left knee pain, unspecified chronicity  Muscle weakness (generalized)  Other abnormalities of gait and mobility     Problem List Patient Active Problem List   Diagnosis Date Noted   FB bronchus    Status post total left knee replacement 04/01/2021   Primary osteoarthritis of left knee 03/31/2021   Peripheral polyneuropathy 12/12/2020   Status post total right knee replacement 12/12/2019   Primary osteoarthritis of right knee 12/11/2019   Localized swelling, mass and lump, neck 07/20/2019   Allergic rhinitis 05/20/2019   GERD (gastroesophageal reflux disease)    COPD (chronic obstructive pulmonary disease) (HCC)    Chronic diastolic (congestive) heart failure ([place]    Breast cancer ([place] 07/06/2018   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   Major depressive disorder 04/08/2018   Carpal tunnel syndrome 03/16/2018   Obesity 08/24/2017   Plantar fasciitis 06/06/2016   Low back pain 05/21/2016   Osteoarthritis of knees, bilateral 05/31/2015   Knee pain 01/04/2015   Dysphagia 08/16/2014   Weight gain 08/05/2014   DOE (dyspnea on exertion) 04/28/2014   Frequent falls 12/23/2013   Anemia in chronic illness 08/11/2012   Status post trachelectomy 08/04/2012   Chronic pain 01/20/2012   History of head and neck cancer 09/26/2011   Hx of radiation therapy    Tracheitis 07/17/2011   Seizure ([place] 07/17/2011   Hypothyroidism 03/19/2011   Essential hypertension 03/19/2011   Larynx cancer ([place]  07/31/2010    DWard Chatters PT 05/23/2021, 10:28 AM  [facility] [address]Phone: [phone]  Fax:  [phone] Name: Deborah TrivediMRN: [id number] of Birth: 7July 21, 1957

## 2021-05-28 ENCOUNTER — Other Ambulatory Visit: Payer: Self-pay

## 2021-05-28 ENCOUNTER — Encounter: Payer: Self-pay | Admitting: Physical Therapy

## 2021-05-28 ENCOUNTER — Ambulatory Visit: Payer: Medicaid Other | Admitting: Physical Therapy

## 2021-05-28 DIAGNOSIS — M25562 Pain in left knee: Secondary | ICD-10-CM

## 2021-05-28 DIAGNOSIS — M6281 Muscle weakness (generalized): Secondary | ICD-10-CM

## 2021-05-28 DIAGNOSIS — R2689 Other abnormalities of gait and mobility: Secondary | ICD-10-CM

## 2021-05-28 NOTE — Therapy (Signed)
[place], [place], [address] Phone: [phone]   Fax:  [phone]  Physical Therapy Treatment  Patient Details  Name: Deborah [name] MRN: [id number] Date of Birth: [birth year] Referring Provider (PT): Leandrew Koyanagi, MD   Encounter Date: 05/28/2021   PT End of Session - 05/28/21 1000     Visit Number 7    Number of Visits 16    Date for PT Re-Evaluation 06/29/21    Authorization Type MCD - [employer]    PT Start Time 1000    PT Stop Time 1043    PT Time Calculation (min) 43 min             Past Medical History:  Diagnosis Date   Anemia    h/o of   Anxiety    Arthritis    in knees   Asthma    Breast cancer ([place])    Bronchitis    COPD (chronic obstructive pulmonary disease) ([place])    Diverticulosis 11/15/2012   noted on screening colonoscopy    Dyspnea    with COPD exerbation   Esophageal stricture    Former smoker 03/19/2011   GERD (gastroesophageal reflux disease)    Heart murmur    asymptomatic    History of laryngectomy    History of radiation therapy 11/15/18- 12/06/18   Right Breast total dose 42.56 Gy in 16 fractions.    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   Leukocytopenia    Nausea alone 07/28/2013   Neck pain 01/21/2012   Normal MRI 07/14/2011   negative for mestasis    Pneumonia 2012   Sciatica    Seizures ([place])    07/24/11 off Effexor w/o seizure   Sepsis ([place]) 08/04/2012   Sinusitis, chronic 07/20/2011   Bilateral maxillary, identified on MRI of head 07/14/11.     Tracheostomy dependent [facility])     Past Surgical History:  Procedure Laterality Date   BREAST LUMPECTOMY Right 07/06/2018   BREAST LUMPECTOMY WITH  RADIOACTIVE SEED LOCALIZATION Right 07/06/2018   Procedure: RIGHT BREAST LUMPECTOMY WITH RADIOACTIVE SEED LOCALIZATION;  Surgeon: Rolm Bookbinder, MD;  Location: [place] [facility];  Service: General;  Laterality: Right;   COLONOSCOPY N/A 11/15/2012   Procedure: COLONOSCOPY;  Surgeon: Lafayette Dragon, MD;  [facility];  Service: Endoscopy;  Laterality: N/A;   CRYOTHERAPY  05/11/2021   Procedure: CRYOTHERAPY;  Surgeon: Garner Nash, DO;  Location: [facility];  Service: Pulmonary;;   DENTAL RESTORATION/EXTRACTION WITH X-RAY     ESOPHAGEAL DILATION N/A 09/09/2019   Procedure: ESOPHAGEAL DILATION;  Surgeon: [name], MD;  Location: [place];  Service: ENT;  Laterality: N/A;   ESOPHAGEAL DILATION N/A 10/05/2019   Procedure: ESOPHAGEAL DILATION;  Surgeon: [name], MD;  Location: [place];  Service: ENT;  Laterality: N/A;  via tracheostomy   ESOPHAGEAL DILATION N/A 02/27/2020   Procedure: ESOPHAGEAL DILATION;  Surgeon: [name], MD;  Location: [place];  Service: ENT;  Laterality: N/A;   ESOPHAGOGASTRODUODENOSCOPY (EGD) WITH PROPOFOL N/A 08/12/2018   Procedure: ESOPHAGOGASTRODUODENOSCOPY (EGD) WITH PROPOFOL;  Surgeon: Doran Stabler, MD;  Location: [place];  Service: Gastroenterology;  Laterality: N/A;  ESOPHAGOSCOPY  06/21/2012   Procedure: ESOPHAGOSCOPY;  Surgeon: [name], MD;  Location: [facility];  Service: ENT;  Laterality: N/A;   ESOPHAGOSCOPY WITH DILITATION N/A 09/21/2014   Procedure: ESOPHAGOSCOPY WITH DILITATION;  Surgeon: [name], MD;  Location: [place];  Service: ENT;  Laterality: N/A;   ESOPHAGOSCOPY WITH DILITATION N/A 07/04/2016   Procedure: ESOPHAGOSCOPY WITH DILITATION;  Surgeon: [name], MD;  Location: [place];  Service: ENT;  Laterality: N/A;   ESOPHAGOSCOPY WITH DILITATION N/A 12/01/2017   Procedure: ESOPHAGOSCOPY WITH DILITATION;  Surgeon: [name], MD;  Location: [place];  Service: ENT;   Laterality: N/A;   ESOPHAGOSCOPY WITH DILITATION N/A 04/19/2018   Procedure: ESOPHAGOSCOPY WITH DILITATION;  Surgeon: [name], MD;  Location: [place];  Service: ENT;  Laterality: N/A;   ESOPHAGOSCOPY WITH DILITATION N/A 03/07/2019   Procedure: Esophagoscopy with dilatation;  Surgeon: [name], MD;  Location: [facility];  Service: ENT;  Laterality: N/A;   ESOPHAGOSCOPY WITH DILITATION N/A 07/16/2020   Procedure: ESOPHAGOSCOPY WITH DILATION;  Surgeon: [name], MD;  Location: [place];  Service: ENT;  Laterality: N/A;   [place] N/A 10/08/2020   Procedure: ESOPHAGOSCOPY With Dilation;  Surgeon: [name], MD;  Location: [place];  Service: ENT;  Laterality: N/A;  21 [place] to [place]  01/08/2018       FOREIGN BODY REMOVAL  05/11/2021   Procedure: FOREIGN BODY REMOVAL;  Surgeon: Garner Nash, DO;  Location: [place];  Service: Pulmonary;;   FOREIGN BODY REMOVAL BRONCHIAL  10/02/2011   Procedure: REMOVAL FOREIGN BODY BRONCHIAL;  Surgeon: Ruby Cola, MD;  Location: [facility];  Service: ENT;  Laterality: N/A;   FOREIGN BODY REMOVAL BRONCHIAL N/A 01/08/2018   Procedure: REMOVAL FOREIGN BODY BRONCHIAL;  Surgeon: Jodi Marble, MD;  [facility];  Service: ENT;  Laterality: N/A;   HEMOSTASIS CONTROL  05/11/2021   Procedure: HEMOSTASIS CONTROL;  Surgeon: Garner Nash, DO;  Location: [place];  Service: Pulmonary;;   LARYNGECTOMY     Porta cath removal     PORTACATH PLACEMENT  09/17/10   Tip in cavoatrial junction   RE-EXCISION OF BREAST CANCER,SUPERIOR MARGINS Right 07/29/2018   Procedure: RE-EXCISION OF RIGHT BREAST MEDIAL MARGINS;  Surgeon: Rolm Bookbinder, MD;  Location: [facility];  Service: General;  Laterality: Right;   RIGID ESOPHAGOSCOPY N/A 03/19/2019   Procedure: FLEXIBLE ESOPHAGOSCOPY;  Surgeon: Jodi Marble, MD;  Location: [facility];  Service: ENT;  Laterality: N/A;    STOMAPLASTY N/A 10/21/2016   Procedure: Zola Button;  Surgeon: [name], MD;  Location: [place];  Service: ENT;  Laterality: N/A;   TOTAL KNEE ARTHROPLASTY Right 12/12/2019   Procedure: RIGHT TOTAL KNEE ARTHROPLASTY;  Surgeon: Leandrew Koyanagi, MD;  Location: [facility];  Service: Orthopedics;  Laterality: Right;   TOTAL KNEE ARTHROPLASTY Left 04/01/2021   Procedure: LEFT TOTAL KNEE ARTHROPLASTY;  Surgeon: Leandrew Koyanagi, MD;  Location: [place];  Service: Orthopedics;  Laterality: Left;   TRACHEAL DILITATION  07/16/2011   Procedure: TRACHEAL DILITATION;  Surgeon: Beckie Salts, MD;  Location: [facility];  Service: ENT;  Laterality: N/A;  dilation of tracheal stoma and replacement of stoma tube   [place] Bilateral 05/11/2021   Procedure: VIDEO BRONCHOSCOPY WITHOUT FLUORO;  Surgeon: Garner Nash, DO;  Location: [place];  Service: Pulmonary;  Laterality: Bilateral;  w/ cryotherapy, Foreign body extraction  There were no vitals filed for this visit.   Subjective Assessment - 05/28/21 1004     Subjective Pt reports that she woke up this morning d/t her L knee pain.  "It's just one of those days".  She reports compliance with HEP.  She has 10/10 L knee pain today.              [place] Adult PT Treatment/Exercise:   Therapeutic Exercise:  NuStep lvl 5 UE/LE x 5 min while taking subjective TG knee ext 3x10 5lbs TG hamstring curl 3x10 15lbs Seated heel slide x 10 - 5" L Seated quad set/hamstring stretch x 10 - 5" L STS 2x10 - no UE support Step ups 2 x10 fwd 6in Leg press - 3x10  - 25# (increase next visit) Standing hip abduction machine 2x10 12.%# Standing march - 2x10 ea (NT) Slant board stretch 45''x 3 Heel raise/lower on step - 3x10    PT Short Term Goals - 05/28/21 1025       PT SHORT TERM GOAL #1   Title Liya will be >75% HEP compliant to improve carryover between sessions and facilitate independent management of condition    Status Achieved     Target Date 05/25/21               PT Long Term Goals - 05/04/21 0953       PT LONG TERM GOAL #1   Title Target date for all long term goals:  06/27/20      PT LONG TERM GOAL #2   Title Stanton Kidney will improve KOOS, Jr. score from 42 (on evaluation) to 62 as a proxy for functional improvement      PT LONG TERM GOAL #3   Title Elenore will achieve 3 degrees knee extension by D/C (see POC end date) to improve stability in stance phase of gait    EVAL: 7 degrees      PT LONG TERM GOAL #4   Title Teralyn will achieve 100 degrees knee flexion by D/C (see POC end date) to improve ability to safely navigate steps in the community    EVAL: 80 degrees      PT LONG TERM GOAL #5   Title Madeleine will improve 30'' STS (MCID 2) to >/= 8x (w/ UE?: n) to show improved LE strength and improved transfers    EVAL: 5x  w/ UE? Y      Additional Long Term Goals   Additional Long Term Goals Yes      PT LONG TERM GOAL #6   Title Makenize will be able to put shoes and socks on, not limited by pain    EVAL: limited                   Plan - 05/28/21 1025     Clinical Impression Statement Overall, Jackelynn is progressing fair with therapy.  Pt reports no increase in baseline pain following therapy.  Today we concentrated on knee strengthening and knee range of motion.  Pt continues to demonstrate higher than expected pain, but good functional improvement.  ROM, strength, and endurance are all improving.  No clear MOI for increased pain today and pt able to complete therapy with good form.  Pt will continue to benefit from skilled physical therapy to address remaining deficits and achieve listed goals.  Continue per POC.    PT Treatment/Interventions ADLs/Self Care Home Management;Aquatic Therapy;Electrical Stimulation;Iontophoresis 71m/ml Dexamethasone;Gait training;Therapeutic activities;Therapeutic exercise;Neuromuscular re-education;Manual techniques;Dry needling;Vasopneumatic Device  PT Next Visit Plan progress L  knee strength and ROM as able    PT Home Exercise Plan [facility]             Patient will benefit from skilled therapeutic intervention in order to improve the following deficits and impairments:  Abnormal gait, Decreased balance, Difficulty walking, Pain, Impaired flexibility, Decreased strength, Decreased range of motion  Visit Diagnosis: Left knee pain, unspecified chronicity  Muscle weakness (generalized)  Other abnormalities of gait and mobility     Problem List Patient Active Problem List   Diagnosis Date Noted   FB bronchus    Status post total left knee replacement 04/01/2021   Primary osteoarthritis of left knee 03/31/2021   Peripheral polyneuropathy 12/12/2020   Status post total right knee replacement 12/12/2019   Primary osteoarthritis of right knee 12/11/2019   Localized swelling, mass and lump, neck 07/20/2019   Allergic rhinitis 05/20/2019   GERD (gastroesophageal reflux disease)    COPD (chronic obstructive pulmonary disease) ([facility])    Chronic diastolic (congestive) heart failure ([place])    Breast cancer ([place]) 07/06/2018   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   Major depressive disorder 04/08/2018   Carpal tunnel syndrome 03/16/2018   Obesity 08/24/2017   Plantar fasciitis 06/06/2016   Low back pain 05/21/2016   Osteoarthritis of knees, bilateral 05/31/2015   Knee pain 01/04/2015   Dysphagia 08/16/2014   Weight gain 08/05/2014   DOE (dyspnea on exertion) 04/28/2014   Frequent falls 12/23/2013   Anemia in chronic illness 08/11/2012   Status post trachelectomy 08/04/2012   Chronic pain 01/20/2012   History of head and neck cancer 09/26/2011   Hx of radiation therapy    Tracheitis 07/17/2011   Seizure ([place]) 07/17/2011   Hypothyroidism 03/19/2011   Essential hypertension 03/19/2011   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

## 2021-06-04 ENCOUNTER — Other Ambulatory Visit: Payer: Self-pay

## 2021-06-04 ENCOUNTER — Encounter: Payer: Self-pay | Admitting: Physical Therapy

## 2021-06-04 ENCOUNTER — Ambulatory Visit: Payer: Medicaid Other | Admitting: Physical Therapy

## 2021-06-04 DIAGNOSIS — M6281 Muscle weakness (generalized): Secondary | ICD-10-CM

## 2021-06-04 DIAGNOSIS — M25562 Pain in left knee: Secondary | ICD-10-CM

## 2021-06-04 DIAGNOSIS — G8929 Other chronic pain: Secondary | ICD-10-CM

## 2021-06-04 DIAGNOSIS — R2689 Other abnormalities of gait and mobility: Secondary | ICD-10-CM

## 2021-06-04 NOTE — Therapy (Signed)
[place], [place], [address] Phone: [phone]   Fax:  [phone]  Physical Therapy Treatment  Patient Details  Name: Deborah [name] MRN: [id number] Date of Birth: [birth year] Referring Provider (PT): Leandrew Koyanagi, MD   Encounter Date: 06/04/2021   PT End of Session - 06/04/21 1124     Visit Number 8    Number of Visits 16    Date for PT Re-Evaluation 06/29/21    Authorization Type MCD - [employer]    PT Start Time 1130    PT Stop Time 1213    PT Time Calculation (min) 43 min             Past Medical History:  Diagnosis Date   Anemia    h/o of   Anxiety    Arthritis    in knees   Asthma    Breast cancer ([place])    Bronchitis    COPD (chronic obstructive pulmonary disease) ([place])    Diverticulosis 11/15/2012   noted on screening colonoscopy    Dyspnea    with COPD exerbation   Esophageal stricture    Former smoker 03/19/2011   GERD (gastroesophageal reflux disease)    Heart murmur    asymptomatic    History of laryngectomy    History of radiation therapy 11/15/18- 12/06/18   Right Breast total dose 42.56 Gy in 16 fractions.    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   Leukocytopenia    Nausea alone 07/28/2013   Neck pain 01/21/2012   Normal MRI 07/14/2011   negative for mestasis    Pneumonia 2012   Sciatica    Seizures ([place])    07/24/11 off Effexor w/o seizure   Sepsis ([place]) 08/04/2012   Sinusitis, chronic 07/20/2011   Bilateral maxillary, identified on MRI of head 07/14/11.     Tracheostomy dependent [facility])     Past Surgical History:  Procedure Laterality Date   BREAST LUMPECTOMY Right 07/06/2018   BREAST LUMPECTOMY WITH  RADIOACTIVE SEED LOCALIZATION Right 07/06/2018   Procedure: RIGHT BREAST LUMPECTOMY WITH RADIOACTIVE SEED LOCALIZATION;  Surgeon: Rolm Bookbinder, MD;  Location: [place];  Service: General;  Laterality: Right;   COLONOSCOPY N/A 11/15/2012   Procedure: COLONOSCOPY;  Surgeon: Lafayette Dragon, MD;  [facility];  Service: Endoscopy;  Laterality: N/A;   CRYOTHERAPY  05/11/2021   Procedure: CRYOTHERAPY;  Surgeon: Garner Nash, DO;  Location: [place] [facility];  Service: Pulmonary;;   DENTAL RESTORATION/EXTRACTION WITH X-RAY     ESOPHAGEAL DILATION N/A 09/09/2019   Procedure: ESOPHAGEAL DILATION;  Surgeon: [name], MD;  Location: [place];  Service: ENT;  Laterality: N/A;   ESOPHAGEAL DILATION N/A 10/05/2019   Procedure: ESOPHAGEAL DILATION;  Surgeon: [name], MD;  Location: [place];  Service: ENT;  Laterality: N/A;  via tracheostomy   ESOPHAGEAL DILATION N/A 02/27/2020   Procedure: ESOPHAGEAL DILATION;  Surgeon: [name], MD;  Location: [place];  Service: ENT;  Laterality: N/A;   ESOPHAGOGASTRODUODENOSCOPY (EGD) WITH PROPOFOL N/A 08/12/2018   Procedure: ESOPHAGOGASTRODUODENOSCOPY (EGD) WITH PROPOFOL;  Surgeon: Doran Stabler, MD;  Location: [place];  Service: Gastroenterology;  Laterality: N/A;  ESOPHAGOSCOPY  06/21/2012   Procedure: ESOPHAGOSCOPY;  Surgeon: [name], MD;  Location: [facility];  Service: ENT;  Laterality: N/A;   ESOPHAGOSCOPY WITH DILITATION N/A 09/21/2014   Procedure: ESOPHAGOSCOPY WITH DILITATION;  Surgeon: [name], MD;  Location: [place];  Service: ENT;  Laterality: N/A;   ESOPHAGOSCOPY WITH DILITATION N/A 07/04/2016   Procedure: ESOPHAGOSCOPY WITH DILITATION;  Surgeon: [name], MD;  Location: [place];  Service: ENT;  Laterality: N/A;   ESOPHAGOSCOPY WITH DILITATION N/A 12/01/2017   Procedure: ESOPHAGOSCOPY WITH DILITATION;  Surgeon: [name], MD;  Location: [place];  Service: ENT;   Laterality: N/A;   ESOPHAGOSCOPY WITH DILITATION N/A 04/19/2018   Procedure: ESOPHAGOSCOPY WITH DILITATION;  Surgeon: [name], MD;  Location: [place];  Service: ENT;  Laterality: N/A;   ESOPHAGOSCOPY WITH DILITATION N/A 03/07/2019   Procedure: Esophagoscopy with dilatation;  Surgeon: [name], MD;  Location: [place];  Service: ENT;  Laterality: N/A;   ESOPHAGOSCOPY WITH DILITATION N/A 07/16/2020   Procedure: ESOPHAGOSCOPY WITH DILATION;  Surgeon: [name], MD;  Location: [place];  Service: ENT;  Laterality: N/A;   [place] N/A 10/08/2020   Procedure: ESOPHAGOSCOPY With Dilation;  Surgeon: [name], MD;  Location: [place];  Service: ENT;  Laterality: N/A;  21 [place] to [place]  01/08/2018       FOREIGN BODY REMOVAL  05/11/2021   Procedure: FOREIGN BODY REMOVAL;  Surgeon: Garner Nash, DO;  Location: [place];  Service: Pulmonary;;   FOREIGN BODY REMOVAL BRONCHIAL  10/02/2011   Procedure: REMOVAL FOREIGN BODY BRONCHIAL;  Surgeon: Ruby Cola, MD;  Location: [place];  Service: ENT;  Laterality: N/A;   FOREIGN BODY REMOVAL BRONCHIAL N/A 01/08/2018   Procedure: REMOVAL FOREIGN BODY BRONCHIAL;  Surgeon: Jodi Marble, MD;  [facility];  Service: ENT;  Laterality: N/A;   HEMOSTASIS CONTROL  05/11/2021   Procedure: HEMOSTASIS CONTROL;  Surgeon: Garner Nash, DO;  Location: [place];  Service: Pulmonary;;   LARYNGECTOMY     Porta cath removal     PORTACATH PLACEMENT  09/17/10   Tip in cavoatrial junction   RE-EXCISION OF BREAST CANCER,SUPERIOR MARGINS Right 07/29/2018   Procedure: RE-EXCISION OF RIGHT BREAST MEDIAL MARGINS;  Surgeon: Rolm Bookbinder, MD;  Location: [place];  Service: General;  Laterality: Right;   RIGID ESOPHAGOSCOPY N/A 03/19/2019   Procedure: FLEXIBLE ESOPHAGOSCOPY;  Surgeon: Jodi Marble, MD;  Location: [place];  Service: ENT;  Laterality: N/A;    STOMAPLASTY N/A 10/21/2016   Procedure: Zola Button;  Surgeon: [name], MD;  Location: [place];  Service: ENT;  Laterality: N/A;   TOTAL KNEE ARTHROPLASTY Right 12/12/2019   Procedure: RIGHT TOTAL KNEE ARTHROPLASTY;  Surgeon: Leandrew Koyanagi, MD;  Location: [place];  Service: Orthopedics;  Laterality: Right;   TOTAL KNEE ARTHROPLASTY Left 04/01/2021   Procedure: LEFT TOTAL KNEE ARTHROPLASTY;  Surgeon: Leandrew Koyanagi, MD;  Location: [place];  Service: Orthopedics;  Laterality: Left;   TRACHEAL DILITATION  07/16/2011   Procedure: TRACHEAL DILITATION;  Surgeon: Beckie Salts, MD;  Location: [place];  Service: ENT;  Laterality: N/A;  dilation of tracheal stoma and replacement of stoma tube   [place] Bilateral 05/11/2021   Procedure: VIDEO BRONCHOSCOPY WITHOUT FLUORO;  Surgeon: Garner Nash, DO;  Location: [place];  Service: Pulmonary;  Laterality: Bilateral;  w/ cryotherapy, Foreign body extraction  There were no vitals filed for this visit.   Subjective Assessment - 06/04/21 1131     Subjective Pt reports that her L knee has been hurting very bad this morning, possibly d/t the cold.  She has had no falls..  She has 10/10 L knee pain today.             Objective:  5-110    [facility] Adult PT Treatment/Exercise:   Therapeutic Exercise:  NuStep lvl 5 UE/LE x 5 min while taking subjective TG knee ext 3x10 10lbs TG hamstring curl 3x10 20lbs Seated quad set/hamstring stretch x 10 - 5" L STS 2x10 - no UE support (not today) Step ups 2 x10 fwd 6in Leg press - 3x10  - 35# Standing hip abduction machine 2x10 25# Slant board stretch 45''x 3 Heel raise/lower on step - 3x10 (not today) Working on heel strike during gait  Patient Education: - HEP was updated and reissued to patient; pt educated on HEP, was provided handout, and verbally confirmed understanding of exercises.  Manual therapy, concentrating on increasing extensibility of restricted tissue to  reduce discomfort and improve mechanics in functional movement: - Seated knee ext mobilization (improved knee ext by ~2 degrees)  HEP: Access Code: [email or web address] URL: [email or web address] Date: 06/04/2021 Prepared by: Shearon Balo  Exercises Long Sitting Quad Set with Towel Roll Under Heel - 5 x daily - 7 x weekly - 3 sets - 10 reps Sitting Heel Slide with Towel - 3 x daily - 7 x weekly - 3 sets - 10 reps Seated Hamstring Stretch - 2 x daily - 7 x weekly - 1 sets - 3 reps - 45 hold Sitting Knee Extension with Resistance - 1 x daily - 7 x weekly - 3 sets - 10 reps     PT Short Term Goals - 05/28/21 1025       PT SHORT TERM GOAL #1   Title Kelissa will be >75% HEP compliant to improve carryover between sessions and facilitate independent management of condition    Status Achieved    Target Date 05/25/21               PT Long Term Goals - 05/04/21 0953       PT LONG TERM GOAL #1   Title Target date for all long term goals:  06/27/20      PT LONG TERM GOAL #2   Title Stanton Kidney will improve KOOS, Jr. score from 42 (on evaluation) to 62 as a proxy for functional improvement      PT LONG TERM GOAL #3   Title Batoul will achieve 3 degrees knee extension by D/C (see POC end date) to improve stability in stance phase of gait    EVAL: 7 degrees      PT LONG TERM GOAL #4   Title Sairah will achieve 100 degrees knee flexion by D/C (see POC end date) to improve ability to safely navigate steps in the community    EVAL: 80 degrees      PT LONG TERM GOAL #5   Title Brieann will improve 30'' STS (MCID 2) to >/= 8x (w/ UE?: n) to show improved LE strength and improved transfers    EVAL: 5x  w/ UE? Y      Additional Long Term Goals   Additional Long Term Goals Yes      PT LONG TERM GOAL #6   Title Matisha will be able to put shoes and socks on, not limited by pain  EVAL: limited                   Plan - 06/04/21 1156     Clinical Impression Statement [place] is progressing  well with therapy.  Pt reports no increase in baseline pain following therapy.  Today we concentrated on knee strengthening, knee range of motion, and normalizing gait.  Pt shows significant difficulty with sequencing with gait showing a lack of heel strike and knee flexion during swing.  This does improve with cuing, but we will continue to work on this.  Her ROM is improving.  Pt will continue to benefit from skilled physical therapy to address remaining deficits and achieve listed goals.  Continue per POC.    PT Treatment/Interventions ADLs/Self Care Home Management;Aquatic Therapy;Electrical Stimulation;Iontophoresis 65m/ml Dexamethasone;Gait training;Therapeutic activities;Therapeutic exercise;Neuromuscular re-education;Manual techniques;Dry needling;Vasopneumatic Device    PT Next Visit Plan progress L knee strength and ROM as able    PT Home Exercise Plan [facility]            Patient will benefit from skilled therapeutic intervention in order to improve the following deficits and impairments:  Abnormal gait, Decreased balance, Difficulty walking, Pain, Impaired flexibility, Decreased strength, Decreased range of motion  Visit Diagnosis: Left knee pain, unspecified chronicity  Muscle weakness (generalized)  Other abnormalities of gait and mobility  Chronic pain of right knee     Problem List Patient Active Problem List   Diagnosis Date Noted   FB bronchus    Status post total left knee replacement 04/01/2021   Primary osteoarthritis of left knee 03/31/2021   Peripheral polyneuropathy 12/12/2020   Status post total right knee replacement 12/12/2019   Primary osteoarthritis of right knee 12/11/2019   Localized swelling, mass and lump, neck 07/20/2019   Allergic rhinitis 05/20/2019   GERD (gastroesophageal reflux disease)    COPD (chronic obstructive pulmonary disease) (HCC)    Chronic diastolic (congestive) heart failure ([facility])    Breast cancer ([place] 07/06/2018   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   Major depressive disorder 04/08/2018   Carpal tunnel syndrome 03/16/2018   Obesity 08/24/2017   Plantar fasciitis 06/06/2016   Low back pain 05/21/2016   Osteoarthritis of knees, bilateral 05/31/2015   Knee pain 01/04/2015   Dysphagia 08/16/2014   Weight gain 08/05/2014   DOE (dyspnea on exertion) 04/28/2014   Frequent falls 12/23/2013   Anemia in chronic illness 08/11/2012   Status post trachelectomy 08/04/2012   Chronic pain 01/20/2012   History of head and neck cancer 09/26/2011   Hx of radiation therapy    Tracheitis 07/17/2011   [place]ure ([place] 07/17/2011   Hypothyroidism 03/19/2011   Essential hypertension 03/19/2011   Larynx cancer ([place] 07/31/2010    KMathis Dad PT 06/04/2021, 12:14 PM  [facility] [address]Phone: [phone]  Fax:  [phone] Name: MRosaleah PersonMRN: [id number] of Birth: 704/02/1956

## 2021-06-06 ENCOUNTER — Ambulatory Visit: Payer: Medicaid Other

## 2021-06-06 ENCOUNTER — Other Ambulatory Visit: Payer: Self-pay

## 2021-06-06 DIAGNOSIS — M6281 Muscle weakness (generalized): Secondary | ICD-10-CM

## 2021-06-06 DIAGNOSIS — M25562 Pain in left knee: Secondary | ICD-10-CM | POA: Diagnosis not present

## 2021-06-06 NOTE — Therapy (Signed)
[place], [place], [address] Phone: [phone]   Fax:  [phone]  Physical Therapy Treatment  Patient Details  Name: Deborah [name] MRN: [id number] Date of Birth: [birth year] Referring Provider (PT): Leandrew Koyanagi, MD   Encounter Date: 06/06/2021   PT End of Session - 06/06/21 0907     Visit Number 9    Number of Visits 16    Date for PT Re-Evaluation 06/29/21    Authorization Type MCD - [employer]    PT Start Time 0915             Past Medical History:  Diagnosis Date   Anemia    h/o of   Anxiety    Arthritis    in knees   Asthma    Breast cancer ([place])    Bronchitis    COPD (chronic obstructive pulmonary disease) ([place])    Diverticulosis 11/15/2012   noted on screening colonoscopy    Dyspnea    with COPD exerbation   Esophageal stricture    Former smoker 03/19/2011   GERD (gastroesophageal reflux disease)    Heart murmur    asymptomatic    History of laryngectomy    History of radiation therapy 11/15/18- 12/06/18   Right Breast total dose 42.56 Gy in 16 fractions.    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   Leukocytopenia    Nausea alone 07/28/2013   Neck pain 01/21/2012   Normal MRI 07/14/2011   negative for mestasis    Pneumonia 2012   Sciatica    Seizures ([place])    07/24/11 off Effexor w/o seizure   Sepsis ([place]) 08/04/2012   Sinusitis, chronic 07/20/2011   Bilateral maxillary, identified on MRI of head 07/14/11.     Tracheostomy dependent [facility])     Past Surgical History:  Procedure Laterality Date   BREAST LUMPECTOMY Right 07/06/2018   BREAST LUMPECTOMY WITH RADIOACTIVE SEED LOCALIZATION Right 07/06/2018   Procedure:  RIGHT BREAST LUMPECTOMY WITH RADIOACTIVE SEED LOCALIZATION;  Surgeon: Rolm Bookbinder, MD;  Location: [place];  Service: General;  Laterality: Right;   COLONOSCOPY N/A 11/15/2012   Procedure: COLONOSCOPY;  Surgeon: Lafayette Dragon, MD;  [facility];  Service: Endoscopy;  Laterality: N/A;   CRYOTHERAPY  05/11/2021   Procedure: CRYOTHERAPY;  Surgeon: Garner Nash, DO;  Location: [facility];  Service: Pulmonary;;   DENTAL RESTORATION/EXTRACTION WITH X-RAY     ESOPHAGEAL DILATION N/A 09/09/2019   Procedure: ESOPHAGEAL DILATION;  Surgeon: [name], MD;  Location: [place];  Service: ENT;  Laterality: N/A;   ESOPHAGEAL DILATION N/A 10/05/2019   Procedure: ESOPHAGEAL DILATION;  Surgeon: [name], MD;  Location: [place];  Service: ENT;  Laterality: N/A;  via tracheostomy   ESOPHAGEAL DILATION N/A 02/27/2020   Procedure: ESOPHAGEAL DILATION;  Surgeon: [name], MD;  Location: [place];  Service: ENT;  Laterality: N/A;   ESOPHAGOGASTRODUODENOSCOPY (EGD) WITH PROPOFOL N/A 08/12/2018   Procedure: ESOPHAGOGASTRODUODENOSCOPY (EGD) WITH PROPOFOL;  Surgeon: Doran Stabler, MD;  Location: [place];  Service: Gastroenterology;  Laterality: N/A;   ESOPHAGOSCOPY  06/21/2012   Procedure: ESOPHAGOSCOPY;  Surgeon: [name], MD;  [facility];  Service: ENT;  Laterality: N/A;   ESOPHAGOSCOPY WITH DILITATION N/A 09/21/2014   Procedure: ESOPHAGOSCOPY WITH DILITATION;  Surgeon: [name], MD;  Location: [place];  Service: ENT;  Laterality: N/A;   ESOPHAGOSCOPY WITH DILITATION N/A 07/04/2016   Procedure: ESOPHAGOSCOPY WITH DILITATION;  Surgeon: [name], MD;  Location: [place];  Service: ENT;  Laterality: N/A;   ESOPHAGOSCOPY WITH DILITATION N/A 12/01/2017   Procedure: ESOPHAGOSCOPY WITH DILITATION;  Surgeon: [name], MD;  Location: [place];  Service: ENT;  Laterality: N/A;   ESOPHAGOSCOPY WITH DILITATION N/A  04/19/2018   Procedure: ESOPHAGOSCOPY WITH DILITATION;  Surgeon: [name], MD;  Location: [place];  Service: ENT;  Laterality: N/A;   ESOPHAGOSCOPY WITH DILITATION N/A 03/07/2019   Procedure: Esophagoscopy with dilatation;  Surgeon: [name], MD;  Location: [place];  Service: ENT;  Laterality: N/A;   ESOPHAGOSCOPY WITH DILITATION N/A 07/16/2020   Procedure: ESOPHAGOSCOPY WITH DILATION;  Surgeon: [name], MD;  Location: [place];  Service: ENT;  Laterality: N/A;   [place] N/A 10/08/2020   Procedure: ESOPHAGOSCOPY With Dilation;  Surgeon: [name], MD;  Location: [place];  Service: ENT;  Laterality: N/A;  21 [place] to [place]  01/08/2018       FOREIGN BODY REMOVAL  05/11/2021   Procedure: FOREIGN BODY REMOVAL;  Surgeon: Garner Nash, DO;  Location: [place];  Service: Pulmonary;;   FOREIGN BODY REMOVAL BRONCHIAL  10/02/2011   Procedure: REMOVAL FOREIGN BODY BRONCHIAL;  Surgeon: Ruby Cola, MD;  Location: [place];  Service: ENT;  Laterality: N/A;   FOREIGN BODY REMOVAL BRONCHIAL N/A 01/08/2018   Procedure: REMOVAL FOREIGN BODY BRONCHIAL;  Surgeon: Jodi Marble, MD;  [facility];  Service: ENT;  Laterality: N/A;   HEMOSTASIS CONTROL  05/11/2021   Procedure: HEMOSTASIS CONTROL;  Surgeon: Garner Nash, DO;  Location: [place];  Service: Pulmonary;;   LARYNGECTOMY     Porta cath removal     PORTACATH PLACEMENT  09/17/10   Tip in cavoatrial junction   RE-EXCISION OF BREAST CANCER,SUPERIOR MARGINS Right 07/29/2018   Procedure: RE-EXCISION OF RIGHT BREAST MEDIAL MARGINS;  Surgeon: Rolm Bookbinder, MD;  Location: [place];  Service: General;  Laterality: Right;   RIGID ESOPHAGOSCOPY N/A 03/19/2019   Procedure: FLEXIBLE ESOPHAGOSCOPY;  Surgeon: Jodi Marble, MD;  Location: [place];  Service: ENT;  Laterality: N/A;   STOMAPLASTY N/A 10/21/2016   Procedure: Zola Button;   Surgeon: [name], MD;  Location: [place];  Service: ENT;  Laterality: N/A;   TOTAL KNEE ARTHROPLASTY Right 12/12/2019   Procedure: RIGHT TOTAL KNEE ARTHROPLASTY;  Surgeon: Leandrew Koyanagi, MD;  Location: [place];  Service: Orthopedics;  Laterality: Right;   TOTAL KNEE ARTHROPLASTY Left 04/01/2021   Procedure: LEFT TOTAL KNEE ARTHROPLASTY;  Surgeon: Leandrew Koyanagi, MD;  Location: [place];  Service: Orthopedics;  Laterality: Left;   TRACHEAL DILITATION  07/16/2011   Procedure: TRACHEAL DILITATION;  Surgeon: Beckie Salts, MD;  Location: [place];  Service: ENT;  Laterality: N/A;  dilation of tracheal stoma and replacement of stoma tube   [place] Bilateral 05/11/2021   Procedure: VIDEO BRONCHOSCOPY WITHOUT FLUORO;  Surgeon: Garner Nash, DO;  Location: [place];  Service: Pulmonary;  Laterality: Bilateral;  w/ cryotherapy, Foreign body extraction    There were no vitals filed for this visit.   Subjective Assessment - 06/06/21  0915     Subjective Pt presents to PT with reports of slight decrease in L knee pain. Has been compliant with HEP. Pt is ready to begin PT at this time.    Currently in Pain? Yes    Pain Score 7     Pain Location Knee    Pain Orientation Left           [facility] Adult PT Treatment/Exercise:   Therapeutic Exercise:  NuStep lvl 5 LE x 5 min while taking subjective TG knee ext 3x10 10lbs TG hamstring curl 3x10 20lbs Seated quad set/hamstring stretch x 10 - 10" L STS 2x10 - no UE support Step ups 2 x10 fwd 6in Leg press - 3x10  - 35# L only Standing hip abduction machine 2x10 30# Slant board stretch 45''x 3 Heel raise/lower on step - 3x10 (not today)                               PT Short Term Goals - 05/28/21 1025       PT SHORT TERM GOAL #1   Title Camylle will be >75% HEP compliant to improve carryover between sessions and facilitate independent management of condition    Status Achieved    Target  Date 05/25/21               PT Long Term Goals - 05/04/21 0953       PT LONG TERM GOAL #1   Title Target date for all long term goals:  06/27/20      PT LONG TERM GOAL #2   Title Stanton Kidney will improve KOOS, Jr. score from 42 (on evaluation) to 62 as a proxy for functional improvement      PT LONG TERM GOAL #3   Title Janann will achieve 3 degrees knee extension by D/C (see POC end date) to improve stability in stance phase of gait    EVAL: 7 degrees      PT LONG TERM GOAL #4   Title Yassmine will achieve 100 degrees knee flexion by D/C (see POC end date) to improve ability to safely navigate steps in the community    EVAL: 80 degrees      PT LONG TERM GOAL #5   Title Zylpha will improve 30'' STS (MCID 2) to >/= 8x (w/ UE?: n) to show improved LE strength and improved transfers    EVAL: 5x  w/ UE? Y      Additional Long Term Goals   Additional Long Term Goals Yes      PT LONG TERM GOAL #6   Title Basilia will be able to put shoes and socks on, not limited by pain    EVAL: limited                    Patient will benefit from skilled therapeutic intervention in order to improve the following deficits and impairments:     Visit Diagnosis: Left knee pain, unspecified chronicity  Muscle weakness (generalized)     Problem List Patient Active Problem List   Diagnosis Date Noted   FB bronchus    Status post total left knee replacement 04/01/2021   Primary osteoarthritis of left knee 03/31/2021   Peripheral polyneuropathy 12/12/2020   Status post total right knee replacement 12/12/2019   Primary osteoarthritis of right knee 12/11/2019   Localized swelling, mass and lump, neck 07/20/2019   Allergic rhinitis 05/20/2019  GERD (gastroesophageal reflux disease)    COPD (chronic obstructive pulmonary disease) ([facility])    Chronic diastolic (congestive) heart failure ([facility])    Breast cancer ([place]) 07/06/2018   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   Major  depressive disorder 04/08/2018   Carpal tunnel syndrome 03/16/2018   Obesity 08/24/2017   Plantar fasciitis 06/06/2016   Low back pain 05/21/2016   Osteoarthritis of knees, bilateral 05/31/2015   Knee pain 01/04/2015   Dysphagia 08/16/2014   Weight gain 08/05/2014   DOE (dyspnea on exertion) 04/28/2014   Frequent falls 12/23/2013   Anemia in chronic illness 08/11/2012   Status post trachelectomy 08/04/2012   Chronic pain 01/20/2012   History of head and neck cancer 09/26/2011   Hx of radiation therapy    Tracheitis 07/17/2011   Seizure ([place]) 07/17/2011   Hypothyroidism 03/19/2011   Essential hypertension 03/19/2011   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 [place], [place], [address] Phone: [phone]   Fax:  [phone]  Name: Deborah [name] MRN: [id number] Date of Birth: [birth year]

## 2021-06-12 ENCOUNTER — Ambulatory Visit: Payer: Medicaid Other

## 2021-06-12 ENCOUNTER — Encounter (HOSPITAL_BASED_OUTPATIENT_CLINIC_OR_DEPARTMENT_OTHER): Payer: Self-pay | Admitting: Otolaryngology

## 2021-06-12 ENCOUNTER — Other Ambulatory Visit: Payer: Self-pay

## 2021-06-13 ENCOUNTER — Encounter (HOSPITAL_BASED_OUTPATIENT_CLINIC_OR_DEPARTMENT_OTHER)
Admission: RE | Admit: 2021-06-13 | Discharge: 2021-06-13 | Disposition: A | Discharge status: Home / Self Care | Payer: Medicaid Other | Source: Ambulatory Visit | Attending: Otolaryngology | Admitting: Otolaryngology

## 2021-06-13 DIAGNOSIS — Z79899 Other long term (current) drug therapy: Secondary | ICD-10-CM | POA: Diagnosis not present

## 2021-06-13 DIAGNOSIS — Z01812 Encounter for preprocedural laboratory examination: Secondary | ICD-10-CM | POA: Diagnosis not present

## 2021-06-13 DIAGNOSIS — I10 Essential (primary) hypertension: Secondary | ICD-10-CM | POA: Diagnosis not present

## 2021-06-13 LAB — BASIC METABOLIC PANEL
Anion gap: 10 (ref 5–15)
BUN: 17 mg/dL (ref 8–23)
CO2: 23 mmol/L (ref 22–32)
Calcium: 9.2 mg/dL (ref 8.9–10.3)
Chloride: 101 mmol/L (ref 98–111)
Creatinine, Ser: 1.25 mg/dL — ABNORMAL HIGH (ref 0.44–1.00)
GFR, Estimated: 48 mL/min — ABNORMAL LOW (ref >60)
Glucose, Bld: 144 mg/dL — ABNORMAL HIGH (ref 70–99)
Potassium: 3.4 mmol/L — ABNORMAL LOW (ref 3.5–5.1)
Sodium: 134 mmol/L — ABNORMAL LOW (ref 135–145)

## 2021-06-14 ENCOUNTER — Ambulatory Visit: Payer: Medicaid Other | Admitting: Physical Therapy

## 2021-06-18 ENCOUNTER — Other Ambulatory Visit: Payer: Self-pay

## 2021-06-18 ENCOUNTER — Ambulatory Visit: Payer: Medicaid Other | Attending: Orthopaedic Surgery

## 2021-06-18 DIAGNOSIS — R2689 Other abnormalities of gait and mobility: Secondary | ICD-10-CM | POA: Insufficient documentation

## 2021-06-18 DIAGNOSIS — M6281 Muscle weakness (generalized): Secondary | ICD-10-CM | POA: Diagnosis not present

## 2021-06-18 DIAGNOSIS — M25562 Pain in left knee: Secondary | ICD-10-CM | POA: Diagnosis not present

## 2021-06-18 NOTE — Therapy (Signed)
[place], [place], [address] Phone: [phone]   Fax:  [phone]  Physical Therapy Treatment  Patient Details  Name: Deborah [name] MRN: [id number] Date of Birth: [birth year] Referring Provider (PT): Leandrew Koyanagi, MD   Encounter Date: 06/18/2021   PT End of Session - 06/18/21 1821     Visit Number 10    Number of Visits 16    Date for PT Re-Evaluation 06/29/21    Authorization Type MCD - [employer]    PT Start Time 1825    PT Stop Time 1905    PT Time Calculation (min) 40 min    Activity Tolerance Patient tolerated treatment well    Behavior During Therapy [facility] for tasks assessed/performed             Past Medical History:  Diagnosis Date   Anemia    h/o of   Anxiety    Arthritis    in knees   Asthma    Breast cancer ([place])    Bronchitis    COPD (chronic obstructive pulmonary disease) ([place])    Diverticulosis 11/15/2012   noted on screening colonoscopy    Dyspnea    with COPD exerbation   Esophageal stricture    Former smoker 03/19/2011   GERD (gastroesophageal reflux disease)    Heart murmur    asymptomatic    History of laryngectomy    History of radiation therapy 11/15/18- 12/06/18   Right Breast total dose 42.56 Gy in 16 fractions.    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   Leukocytopenia    Nausea alone 07/28/2013   Neck pain 01/21/2012   Normal MRI 07/14/2011   negative for mestasis    Pneumonia 2012   Sciatica    Seizures ([place])    07/24/11 off Effexor w/o seizure   Sepsis ([place]) 08/04/2012   Sinusitis, chronic 07/20/2011   Bilateral maxillary, identified on MRI of head 07/14/11.     Tracheostomy dependent [facility])      Past Surgical History:  Procedure Laterality Date   BREAST LUMPECTOMY Right 07/06/2018   BREAST LUMPECTOMY WITH RADIOACTIVE SEED LOCALIZATION Right 07/06/2018   Procedure: RIGHT BREAST LUMPECTOMY WITH RADIOACTIVE SEED LOCALIZATION;  Surgeon: Rolm Bookbinder, MD;  Location: [place];  Service: General;  Laterality: Right;   COLONOSCOPY N/A 11/15/2012   Procedure: COLONOSCOPY;  Surgeon: Lafayette Dragon, MD;  [facility];  Service: Endoscopy;  Laterality: N/A;   CRYOTHERAPY  05/11/2021   Procedure: CRYOTHERAPY;  Surgeon: Garner Nash, DO;  Location: [place] [facility];  Service: Pulmonary;;   DENTAL RESTORATION/EXTRACTION WITH X-RAY     ESOPHAGEAL DILATION N/A 09/09/2019   Procedure: ESOPHAGEAL DILATION;  Surgeon: [name], MD;  Location: [place];  Service: ENT;  Laterality: N/A;   ESOPHAGEAL DILATION N/A 10/05/2019   Procedure: ESOPHAGEAL DILATION;  Surgeon: [name], MD;  Location: [place];  Service: ENT;  Laterality: N/A;  via tracheostomy   [place] N/A 02/27/2020   Procedure: ESOPHAGEAL DILATION;  Surgeon: [name], MD;  Location: [place];  Service: ENT;  Laterality: N/A;   ESOPHAGOGASTRODUODENOSCOPY (EGD) WITH PROPOFOL N/A 08/12/2018   Procedure: ESOPHAGOGASTRODUODENOSCOPY (EGD) WITH PROPOFOL;  Surgeon: Doran Stabler, MD;  Location: [place];  Service: Gastroenterology;  Laterality: N/A;   ESOPHAGOSCOPY  06/21/2012   Procedure: ESOPHAGOSCOPY;  Surgeon: [name], MD;  Location: [place];  Service: ENT;  Laterality: N/A;   ESOPHAGOSCOPY WITH DILITATION N/A 09/21/2014   Procedure: ESOPHAGOSCOPY WITH DILITATION;  Surgeon: [name], MD;  Location: [place];  Service: ENT;  Laterality: N/A;   ESOPHAGOSCOPY WITH DILITATION N/A 07/04/2016   Procedure: ESOPHAGOSCOPY WITH DILITATION;  Surgeon: [name], MD;  Location: [place];  Service: ENT;  Laterality: N/A;   ESOPHAGOSCOPY WITH DILITATION  N/A 12/01/2017   Procedure: ESOPHAGOSCOPY WITH DILITATION;  Surgeon: [name], MD;  Location: [place];  Service: ENT;  Laterality: N/A;   ESOPHAGOSCOPY WITH DILITATION N/A 04/19/2018   Procedure: ESOPHAGOSCOPY WITH DILITATION;  Surgeon: [name], MD;  Location: [place];  Service: ENT;  Laterality: N/A;   ESOPHAGOSCOPY WITH DILITATION N/A 03/07/2019   Procedure: Esophagoscopy with dilatation;  Surgeon: [name], MD;  Location: [place];  Service: ENT;  Laterality: N/A;   ESOPHAGOSCOPY WITH DILITATION N/A 07/16/2020   Procedure: ESOPHAGOSCOPY WITH DILATION;  Surgeon: [name], MD;  Location: [place];  Service: ENT;  Laterality: N/A;   [place] N/A 10/08/2020   Procedure: ESOPHAGOSCOPY With Dilation;  Surgeon: [name], MD;  Location: [place];  Service: ENT;  Laterality: N/A;  21 [place] to [place]  01/08/2018       FOREIGN BODY REMOVAL  05/11/2021   Procedure: FOREIGN BODY REMOVAL;  Surgeon: Garner Nash, DO;  Location: [place];  Service: Pulmonary;;   FOREIGN BODY REMOVAL BRONCHIAL  10/02/2011   Procedure: REMOVAL FOREIGN BODY BRONCHIAL;  Surgeon: Ruby Cola, MD;  Location: [place];  Service: ENT;  Laterality: N/A;   FOREIGN BODY REMOVAL BRONCHIAL N/A 01/08/2018   Procedure: REMOVAL FOREIGN BODY BRONCHIAL;  Surgeon: Jodi Marble, MD;  [facility];  Service: ENT;  Laterality: N/A;   HEMOSTASIS CONTROL  05/11/2021   Procedure: HEMOSTASIS CONTROL;  Surgeon: Garner Nash, DO;  Location: [place];  Service: Pulmonary;;   LARYNGECTOMY     Porta cath removal     PORTACATH PLACEMENT  09/17/10   Tip in cavoatrial junction   RE-EXCISION OF BREAST CANCER,SUPERIOR MARGINS Right 07/29/2018   Procedure: RE-EXCISION OF RIGHT BREAST MEDIAL MARGINS;  Surgeon: Rolm Bookbinder, MD;  Location: [place];  Service: General;  Laterality: Right;   RIGID ESOPHAGOSCOPY N/A 03/19/2019    Procedure: FLEXIBLE ESOPHAGOSCOPY;  Surgeon: Jodi Marble, MD;  Location: [place];  Service: ENT;  Laterality: N/A;   STOMAPLASTY N/A 10/21/2016   Procedure: Zola Button;  Surgeon: [name], MD;  Location: [place];  Service: ENT;  Laterality: N/A;   TOTAL KNEE ARTHROPLASTY Right 12/12/2019   Procedure: RIGHT TOTAL KNEE ARTHROPLASTY;  Surgeon: Leandrew Koyanagi, MD;  Location: [place];  Service: Orthopedics;  Laterality: Right;   TOTAL KNEE ARTHROPLASTY Left 04/01/2021   Procedure: LEFT TOTAL KNEE ARTHROPLASTY;  Surgeon: Leandrew Koyanagi, MD;  Location: [place];  Service: Orthopedics;  Laterality: Left;   TRACHEAL DILITATION  07/16/2011   Procedure: TRACHEAL DILITATION;  Surgeon: Beckie Salts, MD;  Location: [place];  Service: ENT;  Laterality: N/A;  dilation of tracheal stoma and replacement of stoma tube   [place] Bilateral 05/11/2021   Procedure: VIDEO BRONCHOSCOPY WITHOUT FLUORO;  Surgeon: Garner Nash,  DO;  Location: [place] [facility];  Service: Pulmonary;  Laterality: Bilateral;  w/ cryotherapy, Foreign body extraction    There were no vitals filed for this visit.   Subjective Assessment - 06/18/21 1821     Subjective Pt presents to PT with no current pain in L knee. Has been compliant with HEP with no adverse effect. Pt is ready to begin PT treatment at this time.    Currently in Pain? No/denies    Pain Score 0-No pain           [facility] Adult PT Treatment/Exercise:   Therapeutic Exercise:  NuStep lvl 5 LE x 5 min while taking subjective TG knee ext 3x10 10lbs TG hamstring curl 3x10 20lbs Seated heel slide x 10 - L  Seated quad set/hamstring stretch x 10 - 5" L STS 2x10 - no UE support Step ups 2 x10 fwd 8in Leg press - 3x10  - 35# L only Standing hip abduction machine 2x10 30# Slant board stretch 45''x 3 Heel raise/lower on step - 3x10 (not today)     [facility] PT Assessment - 06/18/21 0001       AROM   Left Knee Extension 3    Left Knee Flexion 108                                       PT Short Term Goals - 05/28/21 1025       PT SHORT TERM GOAL #1   Title Deborah [name] will be >75% HEP compliant to improve carryover between sessions and facilitate independent management of condition    Status Achieved    Target Date 05/25/21               PT Long Term Goals - 05/04/21 0953       PT LONG TERM GOAL #1   Title Target date for all long term goals:  06/27/20      PT LONG TERM GOAL #2   Title Deborah [name] will improve KOOS, Jr. score from 42 (on evaluation) to 62 as a proxy for functional improvement      PT LONG TERM GOAL #3   Title Deborah [name] will achieve 3 degrees knee extension by D/C (see POC end date) to improve stability in stance phase of gait    EVAL: 7 degrees      PT LONG TERM GOAL #4   Title Deborah [name] will achieve 100 degrees knee flexion by D/C (see POC end date) to improve ability to safely navigate steps in the community    EVAL: 80 degrees      PT LONG TERM GOAL #5   Title Deborah [name] will improve 30'' STS (MCID 2) to >/= 8x (w/ UE?: n) to show improved LE strength and improved transfers    EVAL: 5x  w/ UE? Y      Additional Long Term Goals   Additional Long Term Goals Yes      PT LONG TERM GOAL #6   Title Deborah [name] will be able to put shoes and socks on, not limited by pain    EVAL: limited                   Plan - 06/18/21 1833     Clinical Impression Statement Pt was able to complete prescribed exercises, not adverse effects. Therapy continued to progress and focus  on strengthening of L knee and improving L knee  AROM. She continues to benefit from skilled PT services and will continue to be seen and progressed per POC as prescribed.    PT Treatment/Interventions ADLs/Self Care Home Management;Aquatic Therapy;Electrical Stimulation;Iontophoresis 52m/ml Dexamethasone;Gait training;Therapeutic activities;Therapeutic exercise;Neuromuscular re-education;Manual techniques;Dry needling;Vasopneumatic Device     PT Next Visit Plan progress L knee strength and ROM as able    PT [facility]            Patient will benefit from skilled therapeutic intervention in order to improve the following deficits and impairments:  Abnormal gait, Decreased balance, Difficulty walking, Pain, Impaired flexibility, Decreased strength, Decreased range of motion  Visit Diagnosis: Left knee pain, unspecified chronicity  Muscle weakness (generalized)     Problem List Patient Active Problem List   Diagnosis Date Noted   FB bronchus    Status post total left knee replacement 04/01/2021   Primary osteoarthritis of left knee 03/31/2021   Peripheral polyneuropathy 12/12/2020   Status post total right knee replacement 12/12/2019   Primary osteoarthritis of right knee 12/11/2019   Localized swelling, mass and lump, neck 07/20/2019   Allergic rhinitis 05/20/2019   GERD (gastroesophageal reflux disease)    COPD (chronic obstructive pulmonary disease) (HCC)    Chronic diastolic (congestive) heart failure ([place]    Breast cancer ([place] 07/06/2018   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   Major depressive disorder 04/08/2018   Carpal tunnel syndrome 03/16/2018   Obesity 08/24/2017   Plantar fasciitis 06/06/2016   Low back pain 05/21/2016   Osteoarthritis of knees, bilateral 05/31/2015   Knee pain 01/04/2015   Dysphagia 08/16/2014   Weight gain 08/05/2014   DOE (dyspnea on exertion) 04/28/2014   Frequent falls 12/23/2013   Anemia in chronic illness 08/11/2012   Status post trachelectomy 08/04/2012   Chronic pain 01/20/2012   History of head and neck cancer 09/26/2011   Hx of radiation therapy    Tracheitis 07/17/2011   [place]izure ([place] 07/17/2011   Hypothyroidism 03/19/2011   Essential hypertension 03/19/2011   Larynx cancer ([place] 07/31/2010    DWard [name] PT 06/18/2021, 7:07 PM  [facility]GMoravian [place] [address]Phone: [phone]  Fax:  [phone] Name: MJanna OakMRN: [id number] of Birth: [birth year]

## 2021-06-19 NOTE — H&P (Signed)
Deborah [name] is an 66 y.o. female.   Chief Complaint: Dysphagia HPI: Remote history of laryngectomy and radiation, having difficulty swallowing, needing serial esophagoscopy with dilatation.          Past Medical History:  Diagnosis Date   Anemia      h/o of   Arthritis      in knees   Asthma     Breast cancer ([place])     Bronchitis     COPD (chronic obstructive pulmonary disease) ([place])     Diverticulosis 11/15/2012     noted on screening colonoscopy    Dyspnea      with COPD exerbation   Esophageal stricture     Former smoker 03/19/2011   GERD (gastroesophageal reflux disease)     Heart murmur      asymptomatic    History of laryngectomy     History of radiation therapy 11/15/18- 12/06/18    Right Breast total dose 42.56 Gy in 16 fractions.    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   Leukocytopenia     Nausea alone 07/28/2013   Neck pain 01/21/2012   Normal MRI 07/14/11    negative for mestasis    Pneumonia 2012   Sciatica     Seizures ([place])      07/24/11 off Effexor w/o seizure   Sepsis ([place]) 08/04/12   Sinusitis, chronic 07/20/2011    Bilateral maxillary, identified on MRI of head 07/14/11.     Tracheostomy dependent [facility])                 Past Surgical History:  Procedure Laterality Date   BREAST LUMPECTOMY Right 07/06/2018   BREAST LUMPECTOMY WITH RADIOACTIVE SEED LOCALIZATION Right 07/06/2018    Procedure: RIGHT BREAST LUMPECTOMY WITH RADIOACTIVE SEED LOCALIZATION;  Surgeon: Rolm Bookbinder, MD;  Location: [place];  Service: General;  Laterality: Right;   COLONOSCOPY N/A 11/15/2012    Procedure: COLONOSCOPY;  Surgeon: Lafayette Dragon, MD;  [facility];  Service: Endoscopy;  Laterality: N/A;   DENTAL  RESTORATION/EXTRACTION WITH X-RAY       ESOPHAGEAL DILATION N/A 09/09/2019    Procedure: ESOPHAGEAL DILATION;  Surgeon: [name], MD;  Location: [place];  Service: ENT;  Laterality: N/A;   ESOPHAGEAL DILATION N/A 10/05/2019    Procedure: ESOPHAGEAL DILATION;  Surgeon: [name], MD;  Location: [place];  Service: ENT;  Laterality: N/A;  via tracheostomy   ESOPHAGEAL DILATION N/A 02/27/2020    Procedure: ESOPHAGEAL DILATION;  Surgeon: [name], MD;  Location: [place];  Service: ENT;  Laterality: N/A;   ESOPHAGOGASTRODUODENOSCOPY (EGD) WITH PROPOFOL N/A 08/12/2018    Procedure: ESOPHAGOGASTRODUODENOSCOPY (EGD) WITH PROPOFOL;  Surgeon: Doran Stabler, MD;  Location: [place];  Service: Gastroenterology;  Laterality: N/A;   ESOPHAGOSCOPY   06/21/2012    Procedure: ESOPHAGOSCOPY;  Surgeon: [name], MD;  Location: [place];  Service: ENT;  Laterality: N/A;   ESOPHAGOSCOPY WITH DILITATION N/A 09/21/2014    Procedure: ESOPHAGOSCOPY WITH DILITATION;  Surgeon: [name], MD;  Location: [place];  Service: ENT;  Laterality: N/A;   ESOPHAGOSCOPY WITH DILITATION N/A 07/04/2016  Procedure: ESOPHAGOSCOPY WITH DILITATION;  Surgeon: [name], MD;  Location: [place];  Service: ENT;  Laterality: N/A;   ESOPHAGOSCOPY WITH DILITATION N/A 12/01/2017    Procedure: ESOPHAGOSCOPY WITH DILITATION;  Surgeon: [name], MD;  Location: [place];  Service: ENT;  Laterality: N/A;   ESOPHAGOSCOPY WITH DILITATION N/A 04/19/2018    Procedure: ESOPHAGOSCOPY WITH DILITATION;  Surgeon: [name], MD;  Location: [place];  Service: ENT;  Laterality: N/A;   ESOPHAGOSCOPY WITH DILITATION N/A 03/07/2019    Procedure: Esophagoscopy with dilatation;  Surgeon: [name], MD;  Location: [place];  Service: ENT;  Laterality: N/A;   FLEXIBLE BRONCHOSCOPY   01/08/2018        FOREIGN BODY REMOVAL BRONCHIAL   10/02/2011    Procedure: REMOVAL  FOREIGN BODY BRONCHIAL;  Surgeon: Ruby Cola, MD;  Location: [place];  Service: ENT;  Laterality: N/A;   FOREIGN BODY REMOVAL BRONCHIAL N/A 01/08/2018    Procedure: REMOVAL FOREIGN BODY BRONCHIAL;  Surgeon: Jodi Marble, MD;  [facility];  Service: ENT;  Laterality: N/A;   LARYNGECTOMY       Porta cath removal       PORTACATH PLACEMENT   09/17/10    Tip in cavoatrial junction   RE-EXCISION OF BREAST CANCER,SUPERIOR MARGINS Right 07/29/2018    Procedure: RE-EXCISION OF RIGHT BREAST MEDIAL MARGINS;  Surgeon: Rolm Bookbinder, MD;  Location: [place];  Service: General;  Laterality: Right;   RIGID ESOPHAGOSCOPY N/A 03/19/2019    Procedure: FLEXIBLE ESOPHAGOSCOPY;  Surgeon: Jodi Marble, MD;  Location: [place];  Service: ENT;  Laterality: N/A;   STOMAPLASTY N/A 10/21/2016    Procedure: Zola Button;  Surgeon: [name], MD;  Location: [place];  Service: ENT;  Laterality: N/A;   TOTAL KNEE ARTHROPLASTY Right 12/12/2019    Procedure: RIGHT TOTAL KNEE ARTHROPLASTY;  Surgeon: Leandrew Koyanagi, MD;  Location: [place];  Service: Orthopedics;  Laterality: Right;   TRACHEAL DILITATION   07/16/2011    Procedure: TRACHEAL DILITATION;  Surgeon: Beckie Salts, MD;  [facility];  Service: ENT;  Laterality: N/A;  dilation of tracheal stoma and replacement of stoma tube   TUBAL LIGATION   1982               Family History  Problem Relation Age of Onset   Heart disease Mother     Heart disease Father     Heart disease Sister     Cancer Brother          type unknown   Liver disease Maternal Grandmother     Cancer Sister          type unknown   Diabetes Sister     Breast cancer Neg Hx      Social History:  reports that she quit smoking about 9 years ago. Her smoking use included cigarettes. She has a 40.00 pack-year smoking history. She has never used smokeless tobacco. She reports current alcohol use. She reports that she does not use drugs.   Allergies:         Allergies  Allergen Reactions    Lisinopril Cough      No medications prior to admission.      Lab Results Last 48 Hours  No results found for this or any previous visit (from the past 48 hour(s)).    Imaging Results (Last 48 hours)  No results found.      ROS: otherwise negative   There were no vitals taken  for this visit.   PHYSICAL EXAM: Overall appearance:  Healthy appearing, in no distress Head:  Normocephalic, atraumatic. Ears: External auditory canals are clear; tympanic membranes are intact and the middle ears are free of any effusion. Nose: External nose is healthy in appearance. Internal nasal exam free of any lesions or obstruction. Oral Cavity/pharynx:  There are no mucosal lesions or masses identified. Neuro:  No identifiable neurologic deficits. Neck: No palpable neck masses.  Stoma healthy and clear.   Studies Reviewed: none       Assessment/Plan Proceed with esophagoscopy and dilatation.

## 2021-06-20 ENCOUNTER — Ambulatory Visit: Payer: Medicaid Other | Admitting: Physical Therapy

## 2021-06-20 ENCOUNTER — Other Ambulatory Visit: Payer: Self-pay

## 2021-06-20 ENCOUNTER — Encounter: Payer: Self-pay | Admitting: Physical Therapy

## 2021-06-20 DIAGNOSIS — R2689 Other abnormalities of gait and mobility: Secondary | ICD-10-CM | POA: Diagnosis not present

## 2021-06-20 DIAGNOSIS — M6281 Muscle weakness (generalized): Secondary | ICD-10-CM | POA: Diagnosis not present

## 2021-06-20 DIAGNOSIS — M25562 Pain in left knee: Secondary | ICD-10-CM

## 2021-06-20 NOTE — Therapy (Signed)
[place], [place], [address] Phone: [phone]   Fax:  [phone]  Physical Therapy Treatment  Patient Details  Name: Deborah [name] MRN: [id number] Date of Birth: Jun 05, 1956 Referring Provider (PT): Leandrew Koyanagi, MD   Encounter Date: 06/20/2021   PT End of Session - 06/20/21 1605     Visit Number 11    Number of Visits 16    Date for PT Re-Evaluation 08/01/21   extended   Authorization Type Approved 12 PT visits from 05/23/21-07/16/2021    PT Start Time 0405    PT Stop Time 0445    PT Time Calculation (min) 40 min    Activity Tolerance Patient tolerated treatment well    Behavior During Therapy [facility] for tasks assessed/performed             Past Medical History:  Diagnosis Date   Anemia    h/o of   Anxiety    Arthritis    in knees   Asthma    Breast cancer ([place])    Bronchitis    COPD (chronic obstructive pulmonary disease) ([place])    Diverticulosis 11/15/2012   noted on screening colonoscopy    Dyspnea    with COPD exerbation   Esophageal stricture    Former smoker 03/19/2011   GERD (gastroesophageal reflux disease)    Heart murmur    asymptomatic    History of laryngectomy    History of radiation therapy 11/15/18- 12/06/18   Right Breast total dose 42.56 Gy in 16 fractions.    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   Leukocytopenia    Nausea alone 07/28/2013   Neck pain 01/21/2012   Normal MRI 07/14/2011   negative for mestasis    Pneumonia 2012   Sciatica    Seizures ([place])    07/24/11 off Effexor w/o seizure   Sepsis ([place]) 08/04/2012   Sinusitis, chronic 07/20/2011   Bilateral maxillary, identified on MRI of head 07/14/11.      Tracheostomy dependent [facility])     Past Surgical History:  Procedure Laterality Date   BREAST LUMPECTOMY Right 07/06/2018   BREAST LUMPECTOMY WITH RADIOACTIVE SEED LOCALIZATION Right 07/06/2018   Procedure: RIGHT BREAST LUMPECTOMY WITH RADIOACTIVE SEED LOCALIZATION;  Surgeon: Rolm Bookbinder, MD;  Location: [place];  Service: General;  Laterality: Right;   COLONOSCOPY N/A 11/15/2012   Procedure: COLONOSCOPY;  Surgeon: Lafayette Dragon, MD;  [facility];  Service: Endoscopy;  Laterality: N/A;   CRYOTHERAPY  05/11/2021   Procedure: CRYOTHERAPY;  Surgeon: Garner Nash, DO;  Location: [place] [facility];  Service: Pulmonary;;   DENTAL RESTORATION/EXTRACTION WITH X-RAY     ESOPHAGEAL DILATION N/A 09/09/2019   Procedure: ESOPHAGEAL DILATION;  Surgeon: [name], MD;  Location: [place];  Service: ENT;  Laterality: N/A;   ESOPHAGEAL DILATION N/A 10/05/2019   Procedure: ESOPHAGEAL DILATION;  Surgeon: [name], MD;  Location: [place];  Service: ENT;  Laterality: N/A;  via tracheostomy   ESOPHAGEAL DILATION N/A 02/27/2020   Procedure: ESOPHAGEAL DILATION;  Surgeon: [name], MD;  Location: [place];  Service: ENT;  Laterality: N/A;   ESOPHAGOGASTRODUODENOSCOPY (EGD) WITH PROPOFOL N/A 08/12/2018   Procedure:  ESOPHAGOGASTRODUODENOSCOPY (EGD) WITH PROPOFOL;  Surgeon: Doran Stabler, MD;  Location: [place];  Service: Gastroenterology;  Laterality: N/A;   ESOPHAGOSCOPY  06/21/2012   Procedure: ESOPHAGOSCOPY;  Surgeon: [name], MD;  Location: [place];  Service: ENT;  Laterality: N/A;   ESOPHAGOSCOPY WITH DILITATION N/A 09/21/2014   Procedure: ESOPHAGOSCOPY WITH DILITATION;  Surgeon: [name], MD;  Location: [place];  Service: ENT;  Laterality: N/A;   ESOPHAGOSCOPY WITH DILITATION N/A 07/04/2016   Procedure: ESOPHAGOSCOPY WITH DILITATION;  Surgeon: [name], MD;  Location: [place];  Service: ENT;  Laterality: N/A;    ESOPHAGOSCOPY WITH DILITATION N/A 12/01/2017   Procedure: ESOPHAGOSCOPY WITH DILITATION;  Surgeon: [name], MD;  Location: [place];  Service: ENT;  Laterality: N/A;   ESOPHAGOSCOPY WITH DILITATION N/A 04/19/2018   Procedure: ESOPHAGOSCOPY WITH DILITATION;  Surgeon: [name], MD;  Location: [place];  Service: ENT;  Laterality: N/A;   ESOPHAGOSCOPY WITH DILITATION N/A 03/07/2019   Procedure: Esophagoscopy with dilatation;  Surgeon: [name], MD;  Location: [place];  Service: ENT;  Laterality: N/A;   ESOPHAGOSCOPY WITH DILITATION N/A 07/16/2020   Procedure: ESOPHAGOSCOPY WITH DILATION;  Surgeon: [name], MD;  Location: [place];  Service: ENT;  Laterality: N/A;   [place] N/A 10/08/2020   Procedure: ESOPHAGOSCOPY With Dilation;  Surgeon: [name], MD;  Location: [place];  Service: ENT;  Laterality: N/A;  21 [place] to [place]  01/08/2018       FOREIGN BODY REMOVAL  05/11/2021   Procedure: FOREIGN BODY REMOVAL;  Surgeon: Garner Nash, DO;  Location: [place];  Service: Pulmonary;;   FOREIGN BODY REMOVAL BRONCHIAL  10/02/2011   Procedure: REMOVAL FOREIGN BODY BRONCHIAL;  Surgeon: Ruby Cola, MD;  Location: [place];  Service: ENT;  Laterality: N/A;   FOREIGN BODY REMOVAL BRONCHIAL N/A 01/08/2018   Procedure: REMOVAL FOREIGN BODY BRONCHIAL;  Surgeon: Jodi Marble, MD;  [facility];  Service: ENT;  Laterality: N/A;   HEMOSTASIS CONTROL  05/11/2021   Procedure: HEMOSTASIS CONTROL;  Surgeon: Garner Nash, DO;  Location: [place];  Service: Pulmonary;;   LARYNGECTOMY     Porta cath removal     PORTACATH PLACEMENT  09/17/10   Tip in cavoatrial junction   RE-EXCISION OF BREAST CANCER,SUPERIOR MARGINS Right 07/29/2018   Procedure: RE-EXCISION OF RIGHT BREAST MEDIAL MARGINS;  Surgeon: Rolm Bookbinder, MD;  Location: [place];  Service: General;  Laterality: Right;   RIGID  ESOPHAGOSCOPY N/A 03/19/2019   Procedure: FLEXIBLE ESOPHAGOSCOPY;  Surgeon: Jodi Marble, MD;  Location: [place];  Service: ENT;  Laterality: N/A;   STOMAPLASTY N/A 10/21/2016   Procedure: Zola Button;  Surgeon: [name], MD;  Location: [place];  Service: ENT;  Laterality: N/A;   TOTAL KNEE ARTHROPLASTY Right 12/12/2019   Procedure: RIGHT TOTAL KNEE ARTHROPLASTY;  Surgeon: Leandrew Koyanagi, MD;  Location: [place];  Service: Orthopedics;  Laterality: Right;   TOTAL KNEE ARTHROPLASTY Left 04/01/2021   Procedure: LEFT TOTAL KNEE ARTHROPLASTY;  Surgeon: Leandrew Koyanagi, MD;  Location: [place];  Service: Orthopedics;  Laterality: Left;   TRACHEAL DILITATION  07/16/2011   Procedure: TRACHEAL DILITATION;  Surgeon: Beckie Salts, MD;  Location: [place];  Service: ENT;  Laterality: N/A;  dilation of tracheal stoma and replacement of stoma tube   [place] Bilateral 05/11/2021   Procedure: VIDEO BRONCHOSCOPY WITHOUT FLUORO;  Surgeon: Garner Nash, DO;  Location: [place] [facility];  Service: Pulmonary;  Laterality: Bilateral;  w/ cryotherapy, Foreign body extraction    There were no vitals filed for this visit.   Subjective Assessment - 06/20/21 1611     Subjective Pt reports that her knee is stiff.  She does not have any pain but feels that after she sits for a long period she has trouble moving.  She feels her knee is still weak.             Objective:  KOOS, JR. Score: 16 / 28, Interval Score: 47.487 / 100  30'' STS: 7x w/ UE  Knee ext: full  Knee flexion: 100   [facility] Adult PT Treatment/Exercise:   Therapeutic Exercise:  NuStep lvl 5 LE x 6 min while taking subjective Slant board stretch 45''x 3  Neuromuscular re-ed, to improve balance and reduce fall risk: - tandem on foam 30'' bouts - semi tandem (100%) with EC - SLS up to 3'' - hurdle walking 3 laps 4 hurdles alternating leading foot  Therapeutic Activity - collecting information for goals, checking  progress, and reviewing with patient     PT Short Term Goals - 05/28/21 1025       PT SHORT TERM GOAL #1   Title Hera will be >75% HEP compliant to improve carryover between sessions and facilitate independent management of condition    Status Achieved    Target Date 05/25/21               PT Long Term Goals - 06/20/21 1612       PT LONG TERM GOAL #1   Title Target date for all long term goals:  08/01/20      PT LONG TERM GOAL #2   Title Stanton Kidney will improve KOOS, Jr. score from 42 (on evaluation) to 62 as a proxy for functional improvement    Baseline 1/5 47.49    Status On-going      PT LONG TERM GOAL #3   Title Donnah will achieve 3 degrees knee extension by D/C (see POC end date) to improve stability in stance phase of gait    EVAL: 7 degrees    Baseline 1/5: full Ext    Status Achieved      PT LONG TERM GOAL #4   Title Letica will achieve 100 degrees knee flexion by D/C (see POC end date) to improve ability to safely navigate steps in the community    EVAL: 80 degrees    Baseline 1/5: MET    Status Achieved      PT LONG TERM GOAL #5   Title Riddhi will improve 30'' STS (MCID 2) to >/= 8x (w/ UE?: n) to show improved LE strength and improved transfers    EVAL: 5x  w/ UE? Y    Baseline 1/5: 7x w/ UE    Status On-going      PT LONG TERM GOAL #6   Title Fallon will be able to put shoes and socks on, not limited by pain    EVAL: limited    Baseline 1/5: progressing, still challenging    Status On-going                   Plan - 06/20/21 1651     Clinical Impression Statement Letoya Stallone has progressed well with therapy.  Improved impairments include: knee ROM, LE strength.  Functional improvements include: transfers, ambulation distance, steps.  Progressions needed include: balance,  continued progressive knee and hip strengthening.  Barriers to progress include: none.  Please see baseline and/or status section in "Goals" for specific progress on short term  and long term goals established at evaluation.  I recommend continuation of PT to allow completion of remaining goals and continued functional progression.    PT Frequency 2x / week    PT Duration 6 weeks    PT Treatment/Interventions ADLs/Self Care Home Management;Aquatic Therapy;Electrical Stimulation;Iontophoresis 35m/ml Dexamethasone;Gait training;Therapeutic activities;Therapeutic exercise;Neuromuscular re-education;Manual techniques;Dry needling;Vasopneumatic Device    PT Next Visit Plan progress L knee strength and ROM as able    [facility]            Patient will benefit from skilled therapeutic intervention in order to improve the following deficits and impairments:  Abnormal gait, Decreased balance, Difficulty walking, Pain, Impaired flexibility, Decreased strength, Decreased range of motion  Visit Diagnosis: Left knee pain, unspecified chronicity  Muscle weakness (generalized)  Other abnormalities of gait and mobility     Problem List Patient Active Problem List   Diagnosis Date Noted   FB bronchus    Status post total left knee replacement 04/01/2021   Primary osteoarthritis of left knee 03/31/2021   Peripheral polyneuropathy 12/12/2020   Status post total right knee replacement 12/12/2019   Primary osteoarthritis of right knee 12/11/2019   Localized swelling, mass and lump, neck 07/20/2019   Allergic rhinitis 05/20/2019   GERD (gastroesophageal reflux disease)    COPD (chronic obstructive pulmonary disease) ([facility])    Chronic diastolic (congestive) heart failure ([facility]    Breast cancer ([place] 07/06/2018   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   Major depressive disorder 04/08/2018   Carpal tunnel syndrome 03/16/2018   Obesity 08/24/2017   Plantar fasciitis 06/06/2016   Low back pain 05/21/2016   Osteoarthritis of knees, bilateral 05/31/2015   Knee pain 01/04/2015   Dysphagia 08/16/2014   Weight gain 08/05/2014   DOE  (dyspnea on exertion) 04/28/2014   Frequent falls 12/23/2013   Anemia in chronic illness 08/11/2012   Status post trachelectomy 08/04/2012   Chronic pain 01/20/2012   History of head and neck cancer 09/26/2011   Hx of radiation therapy    Tracheitis 07/17/2011   [place]izure ([place] 07/17/2011   Hypothyroidism 03/19/2011   Essential hypertension 03/19/2011   Larynx cancer ([place] 07/31/2010    KMathis Dad PT 06/20/2021, 4:57 PM  CPark [facility] VillageCEn[facility] [address]Phone: [phone]  Fax:  [phone] Name: MNichol AtorMRN: [id number] of Birth: [birth year]

## 2021-06-24 ENCOUNTER — Encounter (HOSPITAL_BASED_OUTPATIENT_CLINIC_OR_DEPARTMENT_OTHER): Payer: Self-pay | Admitting: Otolaryngology

## 2021-06-24 ENCOUNTER — Ambulatory Visit (HOSPITAL_BASED_OUTPATIENT_CLINIC_OR_DEPARTMENT_OTHER)
Admission: RE | Admit: 2021-06-24 | Discharge: 2021-06-24 | Disposition: A | Discharge status: Home / Self Care | Payer: Medicaid Other | Source: Ambulatory Visit | Attending: Otolaryngology | Admitting: Otolaryngology

## 2021-06-24 ENCOUNTER — Encounter (HOSPITAL_BASED_OUTPATIENT_CLINIC_OR_DEPARTMENT_OTHER)
Admission: RE | Disposition: A | Discharge status: Home / Self Care | Payer: Self-pay | Source: Ambulatory Visit | Attending: Otolaryngology

## 2021-06-24 ENCOUNTER — Other Ambulatory Visit: Payer: Self-pay

## 2021-06-24 ENCOUNTER — Ambulatory Visit (HOSPITAL_BASED_OUTPATIENT_CLINIC_OR_DEPARTMENT_OTHER): Payer: Medicaid Other | Admitting: Anesthesiology

## 2021-06-24 DIAGNOSIS — I509 Heart failure, unspecified: Secondary | ICD-10-CM | POA: Insufficient documentation

## 2021-06-24 DIAGNOSIS — M1990 Unspecified osteoarthritis, unspecified site: Secondary | ICD-10-CM | POA: Diagnosis not present

## 2021-06-24 DIAGNOSIS — I110 Hypertensive heart disease with heart failure: Secondary | ICD-10-CM | POA: Insufficient documentation

## 2021-06-24 DIAGNOSIS — Z853 Personal history of malignant neoplasm of breast: Secondary | ICD-10-CM | POA: Diagnosis not present

## 2021-06-24 DIAGNOSIS — K222 Esophageal obstruction: Secondary | ICD-10-CM | POA: Diagnosis not present

## 2021-06-24 DIAGNOSIS — Z9002 Acquired absence of larynx: Secondary | ICD-10-CM | POA: Insufficient documentation

## 2021-06-24 DIAGNOSIS — J449 Chronic obstructive pulmonary disease, unspecified: Secondary | ICD-10-CM | POA: Diagnosis not present

## 2021-06-24 DIAGNOSIS — E039 Hypothyroidism, unspecified: Secondary | ICD-10-CM | POA: Diagnosis not present

## 2021-06-24 DIAGNOSIS — Z8521 Personal history of malignant neoplasm of larynx: Secondary | ICD-10-CM | POA: Diagnosis not present

## 2021-06-24 DIAGNOSIS — Z923 Personal history of irradiation: Secondary | ICD-10-CM | POA: Insufficient documentation

## 2021-06-24 DIAGNOSIS — Z87891 Personal history of nicotine dependence: Secondary | ICD-10-CM | POA: Diagnosis not present

## 2021-06-24 DIAGNOSIS — M5430 Sciatica, unspecified side: Secondary | ICD-10-CM | POA: Insufficient documentation

## 2021-06-24 DIAGNOSIS — Z930 Tracheostomy status: Secondary | ICD-10-CM | POA: Diagnosis not present

## 2021-06-24 DIAGNOSIS — F32A Depression, unspecified: Secondary | ICD-10-CM | POA: Diagnosis not present

## 2021-06-24 DIAGNOSIS — I10 Essential (primary) hypertension: Secondary | ICD-10-CM

## 2021-06-24 DIAGNOSIS — K219 Gastro-esophageal reflux disease without esophagitis: Secondary | ICD-10-CM | POA: Insufficient documentation

## 2021-06-24 HISTORY — PX: ESOPHAGOSCOPY: SHX5534

## 2021-06-24 SURGERY — ESOPHAGOSCOPY
Anesthesia: General | Site: Throat

## 2021-06-24 MED ORDER — DEXAMETHASONE SODIUM PHOSPHATE 4 MG/ML IJ SOLN
INTRAMUSCULAR | Status: DC | PRN
  Administered 2021-06-24: 4 mg via INTRAVENOUS

## 2021-06-24 MED ORDER — LIDOCAINE 2% (20 MG/ML) 5 ML SYRINGE
INTRAMUSCULAR | Status: AC
  Filled 2021-06-24: qty 5

## 2021-06-24 MED ORDER — MIDAZOLAM HCL 2 MG/2ML IJ SOLN
INTRAMUSCULAR | Status: AC
  Filled 2021-06-24: qty 2

## 2021-06-24 MED ORDER — LIDOCAINE 2% (20 MG/ML) 5 ML SYRINGE
INTRAMUSCULAR | Status: DC | PRN
  Administered 2021-06-24: 100 mg via INTRAVENOUS

## 2021-06-24 MED ORDER — PROMETHAZINE HCL 25 MG/ML IJ SOLN: 6.2500 mg | INTRAMUSCULAR | Status: DC | PRN

## 2021-06-24 MED ORDER — HYDROMORPHONE HCL 1 MG/ML IJ SOLN: 0.2500 mg | INTRAMUSCULAR | Status: DC | PRN

## 2021-06-24 MED ORDER — ONDANSETRON HCL 4 MG/2ML IJ SOLN
INTRAMUSCULAR | Status: DC | PRN
Start: 2021-06-24 — End: 2021-06-24
  Administered 2021-06-24: 4 mg via INTRAVENOUS

## 2021-06-24 MED ORDER — PROPOFOL 500 MG/50ML IV EMUL
INTRAVENOUS | Status: AC
  Filled 2021-06-24: qty 50

## 2021-06-24 MED ORDER — FENTANYL CITRATE (PF) 100 MCG/2ML IJ SOLN
INTRAMUSCULAR | Status: AC
  Filled 2021-06-24: qty 2

## 2021-06-24 MED ORDER — PROPOFOL 10 MG/ML IV BOLUS
INTRAVENOUS | Status: DC | PRN
Start: 2021-06-24 — End: 2021-06-24
  Administered 2021-06-24: 150 mg via INTRAVENOUS

## 2021-06-24 MED ORDER — MEPERIDINE HCL 25 MG/ML IJ SOLN: 6.2500 mg | INTRAMUSCULAR | Status: DC | PRN

## 2021-06-24 MED ORDER — AMISULPRIDE (ANTIEMETIC) 5 MG/2ML IV SOLN: 10.0000 mg | Freq: Once | INTRAVENOUS | Status: DC | PRN

## 2021-06-24 MED ORDER — OXYCODONE HCL 5 MG/5ML PO SOLN: 5.0000 mg | Freq: Once | ORAL | Status: DC | PRN

## 2021-06-24 MED ORDER — LACTATED RINGERS IV SOLN: INTRAVENOUS | Status: DC

## 2021-06-24 MED ORDER — SUGAMMADEX SODIUM 500 MG/5ML IV SOLN
INTRAVENOUS | Status: DC | PRN
Start: 2021-06-24 — End: 2021-06-24
  Administered 2021-06-24: 400 mg via INTRAVENOUS

## 2021-06-24 MED ORDER — MIDAZOLAM HCL 5 MG/5ML IJ SOLN
INTRAMUSCULAR | Status: DC | PRN
  Administered 2021-06-24: 2 mg via INTRAVENOUS

## 2021-06-24 MED ORDER — ROCURONIUM BROMIDE 100 MG/10ML IV SOLN
INTRAVENOUS | Status: DC | PRN
  Administered 2021-06-24: 50 mg via INTRAVENOUS

## 2021-06-24 MED ORDER — FENTANYL CITRATE (PF) 100 MCG/2ML IJ SOLN
INTRAMUSCULAR | Status: DC | PRN
  Administered 2021-06-24: 50 ug via INTRAVENOUS

## 2021-06-24 MED ORDER — OXYCODONE HCL 5 MG PO TABS: 5.0000 mg | ORAL_TABLET | Freq: Once | ORAL | Status: DC | PRN

## 2021-06-24 SURGICAL SUPPLY — 15 items
CANISTER SUCT 1200ML W/VALVE (MISCELLANEOUS) ×2 IMPLANT
GLOVE SURG SYN 7.5  E (GLOVE) ×2
GLOVE SURG SYN 7.5 E (GLOVE) ×1 IMPLANT
GLOVE SURG SYN 7.5 PF PI (GLOVE) ×1 IMPLANT
GOWN STRL REUS W/ TWL LRG LVL3 (GOWN DISPOSABLE) IMPLANT
GOWN STRL REUS W/TWL LRG LVL3 (GOWN DISPOSABLE) ×2
NDL HYPO 18GX1.5 BLUNT FILL (NEEDLE) ×1 IMPLANT
NEEDLE HYPO 18GX1.5 BLUNT FILL (NEEDLE) ×2 IMPLANT
NS IRRIG 1000ML POUR BTL (IV SOLUTION) ×2 IMPLANT
PACK BASIN DAY SURGERY FS (CUSTOM PROCEDURE TRAY) ×2 IMPLANT
PATTIES SURGICAL .5 X3 (DISPOSABLE) ×2 IMPLANT
SHEET MEDIUM DRAPE 40X70 STRL (DRAPES) ×2 IMPLANT
SURGILUBE 2OZ TUBE FLIPTOP (MISCELLANEOUS) ×4 IMPLANT
TOWEL GREEN STERILE FF (TOWEL DISPOSABLE) ×2 IMPLANT
TUBE CONNECTING 20X1/4 (TUBING) ×2 IMPLANT

## 2021-06-24 NOTE — Interval H&P Note (Signed)
History and Physical Interval Note:  06/24/2021 8:32 AM  Deborah [name]  has presented today for surgery, with the diagnosis of Esophageal stricture.  The various methods of treatment have been discussed with the patient and family. After consideration of risks, benefits and other options for treatment, the patient has consented to  Procedure(s): ESOPHAGOSCOPY WITH DILATATION (N/A) as a surgical intervention.  The patient's history has been reviewed, patient examined, no change in status, stable for surgery.  I have reviewed the patient's chart and labs.  Questions were answered to the patient's satisfaction.     [name]

## 2021-06-24 NOTE — Anesthesia Preprocedure Evaluation (Signed)
Anesthesia Evaluation  Patient identified by MRN, date of birth, ID band Patient awake    Reviewed: Allergy & Precautions, H&P , NPO status , Patient's Chart, lab work & pertinent test results  Airway Mallampati: Trach   Neck ROM: Full    Dental no notable dental hx. (+) Edentulous Lower, Edentulous Upper   Pulmonary shortness of breath, asthma , COPD,  COPD inhaler, former smoker,  S/p laryngectomy   Pulmonary exam normal breath sounds clear to auscultation       Cardiovascular hypertension, Pt. on home beta blockers and Pt. on medications +CHF  Normal cardiovascular exam Rhythm:Regular Rate:Normal     Neuro/Psych Seizures -,  PSYCHIATRIC DISORDERS Depression  Neuromuscular disease (sciatica)    GI/Hepatic Neg liver ROS, GERD  Medicated and Controlled,  Endo/Other  Hypothyroidism   Renal/GU negative Renal ROS  negative genitourinary   Musculoskeletal  (+) Arthritis , Osteoarthritis,    Abdominal (+) + obese,   Peds negative pediatric ROS (+)  Hematology negative hematology ROS (+)   Anesthesia Other Findings H/o laryngeal cancer s/p chemo/radiation and surgical resection H/o breast cancer + cough/secretions  Reproductive/Obstetrics negative OB ROS                             Anesthesia Physical  Anesthesia Plan  ASA: III  Anesthesia Plan: General   Post-op Pain Management:    Induction: Intravenous  PONV Risk Score and Plan: 3 and Treatment may vary due to age or medical condition, Ondansetron, Midazolam and Dexamethasone  Airway Management Planned: Tracheostomy  Additional Equipment: None  Intra-op Plan:   Post-operative Plan: Extubation in OR  Informed Consent: I have reviewed the patients History and Physical, chart, labs and discussed the procedure including the risks, benefits and alternatives for the proposed anesthesia with the patient or authorized representative  who has indicated his/her understanding and acceptance.     Dental advisory given  Plan Discussed with: CRNA and Anesthesiologist  Anesthesia Plan Comments: (Plan to use 6.0 cuffed tube via stoma to deliver anesthetic gas as this has been successful multiple times in the past. + cough/secretions (plan to suction perioperatively))        Anesthesia Quick Evaluation

## 2021-06-24 NOTE — Anesthesia Procedure Notes (Addendum)
Procedure Name: Intubation Date/Time: 06/24/2021 8:57 AM Performed by: Maryella Shivers, CRNA Pre-anesthesia Checklist: Patient identified, Emergency Drugs available, Suction available and Patient being monitored Patient Re-evaluated:Patient Re-evaluated prior to induction Oxygen Delivery Method: Circle system utilized Preoxygenation: Pre-oxygenation with 100% oxygen Induction Type: IV induction Ventilation: Mask ventilation without difficulty Number of attempts: 1 Airway Equipment and Method: Oral airway Placement Confirmation: ETT inserted through vocal cords under direct vision, positive ETCO2 and breath sounds checked- equal and bilateral Tube secured with: Tape Dental Injury: Teeth and Oropharynx as per pre-operative assessment  Comments: #6 ETT inserted thru stoma without difficulty

## 2021-06-24 NOTE — Op Note (Signed)
OPERATIVE REPORT  DATE OF SURGERY: 06/24/2021  PATIENT:  Deborah [name],  66 y.o. female  PRE-OPERATIVE DIAGNOSIS:  Esophageal stricture  POST-OPERATIVE DIAGNOSIS: Same  PROCEDURE:  Procedure(s): ESOPHAGOSCOPY WITH DILATATION  SURGEON:  Beckie Salts, MD  ASSISTANTS: None  ANESTHESIA:   General   EBL: Less than 5 ml  DRAINS: None  LOCAL MEDICATIONS USED:  None  SPECIMEN:  none  COUNTS:  Correct  PROCEDURE DETAILS: The patient was taken to the operating room and placed on the operating table in the supine position. Following induction of general endotracheal anesthesia, via the tracheal stoma, the patient was draped in the standard fashion for upper endoscopy.  A Jako laryngoscope was used to inspect the neopharynx and esophageal introitus.  No mucosal lesions were identified.  The savory dilators were used starting at 71 French and serially dilating up to 69 [place].  The laryngoscope was removed and the patient was awakened and transferred to recovery in stable condition.    PATIENT DISPOSITION:  To PACU, stable

## 2021-06-24 NOTE — Discharge Instructions (Addendum)
Resume diet and call when you are having trouble again.   Post Anesthesia Home Care Instructions  Activity: Get plenty of rest for the remainder of the day. A responsible individual must stay with you for 24 hours following the procedure.  For the next 24 hours, DO NOT: -Drive a car -Paediatric nurse -Drink alcoholic beverages -Take any medication unless instructed by your physician -Make any legal decisions or sign important papers.  Meals: Start with liquid foods such as gelatin or soup. Progress to regular foods as tolerated. Avoid greasy, spicy, heavy foods. If nausea and/or vomiting occur, drink only clear liquids until the nausea and/or vomiting subsides. Call your physician if vomiting continues.  Special Instructions/Symptoms: Your throat may feel dry or sore from the anesthesia or the breathing tube placed in your throat during surgery. If this causes discomfort, gargle with warm salt water. The discomfort should disappear within 24 hours.  If you had a scopolamine patch placed behind your ear for the management of post- operative nausea and/or vomiting:  1. The medication in the patch is effective for 72 hours, after which it should be removed.  Wrap patch in a tissue and discard in the trash. Wash hands thoroughly with soap and water. 2. You may remove the patch earlier than 72 hours if you experience unpleasant side effects which may include dry mouth, dizziness or visual disturbances. 3. Avoid touching the patch. Wash your hands with soap and water after contact with the patch.

## 2021-06-24 NOTE — Anesthesia Postprocedure Evaluation (Signed)
Anesthesia Post Note  Patient: Grace Blight Moldovan  Procedure(s) Performed: ESOPHAGOSCOPY WITH DILATATION (Throat)     Patient location during evaluation: PACU Anesthesia Type: General Level of consciousness: awake and alert Pain management: pain level controlled Vital Signs Assessment: post-procedure vital signs reviewed and stable Respiratory status: spontaneous breathing, nonlabored ventilation and respiratory function stable Cardiovascular status: blood pressure returned to baseline and stable Postop Assessment: no apparent nausea or vomiting Anesthetic complications: no   No notable events documented.  Last Vitals:  Vitals:   06/24/21 1030 06/24/21 1052  BP: (!) 144/88 (!) 159/78  Pulse: 69 72  Resp: 15 18  Temp:  36.5 C  SpO2: 91% 91%    Last Pain:  Vitals:   06/24/21 1052  TempSrc:   PainSc: 0-No pain                 Lynda Rainwater

## 2021-06-24 NOTE — Transfer of Care (Signed)
Immediate Anesthesia Transfer of Care Note  Patient: Deborah [name]  Procedure(s) Performed: ESOPHAGOSCOPY WITH DILATATION  Patient Location: PACU  Anesthesia Type:General  Level of Consciousness: awake, alert  and oriented  Airway & Oxygen Therapy: Patient Spontanous Breathing and Patient connected to tracheostomy mask oxygen  Post-op Assessment: Report given to RN and Post -op Vital signs reviewed and stable  Post vital signs: Reviewed and stable  Last Vitals:  Vitals Value Taken Time  BP 161/95 06/24/21 0934  Temp    Pulse 71 06/24/21 0941  Resp 17 06/24/21 0941  SpO2 89 % 06/24/21 0941  Vitals shown include unvalidated device data.  Last Pain:  Vitals:   06/24/21 0729  TempSrc: Oral  PainSc: 0-No pain         Complications: No notable events documented.

## 2021-06-25 ENCOUNTER — Encounter: Payer: Self-pay | Admitting: Orthopaedic Surgery

## 2021-06-25 ENCOUNTER — Ambulatory Visit (INDEPENDENT_AMBULATORY_CARE_PROVIDER_SITE_OTHER): Payer: Medicaid Other | Admitting: Orthopaedic Surgery

## 2021-06-25 DIAGNOSIS — Z96652 Presence of left artificial knee joint: Secondary | ICD-10-CM

## 2021-06-25 MED ORDER — METHOCARBAMOL 500 MG PO TABS: 500.0000 mg | ORAL_TABLET | Freq: Two times a day (BID) | ORAL | 3 refills | Status: DC | PRN

## 2021-06-25 MED ORDER — HYDROCODONE-ACETAMINOPHEN 5-325 MG PO TABS: 1.0000 | ORAL_TABLET | Freq: Every day | ORAL | 0 refills | Status: DC | PRN

## 2021-06-25 NOTE — Progress Notes (Signed)
Post-Op Visit Note   Patient: Deborah [name]           Date of Birth: 09/16/1955           MRN: [id number] Visit Date: 06/25/2021 PCP: Zola Button, MD   Assessment & Plan:  Chief Complaint:  Chief Complaint  Patient presents with   Left Knee - Pain   Visit Diagnoses:  1. Status post total left knee replacement     Plan: Ramia is a 55-monthstatus post left total knee replacement.  She complains of some continued stiffness that makes crossing her leg or putting on socks difficult.  Still doing physical therapy.  She complains of some incisional pain.  Left knee shows a fully healed surgical scar.  Range of motion is 0 to 105 degrees.  Stable to varus valgus stress.  There is a residual swelling throughout the knee.  No signs of infection.  From my standpoint findings are consistent with postsurgical changes and I would expect these to continue to improve.  Reassurance was provided.  She should continue with the physical therapy.  I have refilled the hydrocodone and Robaxin.   Recheck in 3 months with two-view x-rays of the left knee.  Follow-Up Instructions: Return in about 3 months (around 09/23/2021).   Orders:  No orders of the defined types were placed in this encounter.  Meds ordered this encounter  Medications   HYDROcodone-acetaminophen (NORCO) 5-325 MG tablet    Sig: Take 1 tablet by mouth daily as needed.    Dispense:  30 tablet    Refill:  0   methocarbamol (ROBAXIN) 500 MG tablet    Sig: Take 1 tablet (500 mg total) by mouth 2 (two) times daily as needed for muscle spasms.    Dispense:  30 tablet    Refill:  3    Imaging: No results found.  PMFS History: Patient Active Problem List   Diagnosis Date Noted   FB bronchus    Status post total left knee replacement 04/01/2021   Primary osteoarthritis of left knee 03/31/2021   Peripheral polyneuropathy 12/12/2020   Status post total right knee replacement 12/12/2019   Primary osteoarthritis of right  knee 12/11/2019   Localized swelling, mass and lump, neck 07/20/2019   Allergic rhinitis 05/20/2019   GERD (gastroesophageal reflux disease)    COPD (chronic obstructive pulmonary disease) (HCC)    Chronic diastolic (congestive) heart failure ([facility]    [place]ast cancer ([place] 07/06/2018   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   Major depressive disorder 04/08/2018   Carpal tunnel syndrome 03/16/2018   Obesity 08/24/2017   Plantar fasciitis 06/06/2016   Low back pain 05/21/2016   Osteoarthritis of knees, bilateral 05/31/2015   Knee pain 01/04/2015   Dysphagia 08/16/2014   Weight gain 08/05/2014   DOE (dyspnea on exertion) 04/28/2014   Frequent falls 12/23/2013   Anemia in chronic illness 08/11/2012   Status post trachelectomy 08/04/2012   Chronic pain 01/20/2012   History of head and neck cancer 09/26/2011   Hx of radiation therapy    Tracheitis 07/17/2011   Seizure ([facility] 07/17/2011   Hypothyroidism 03/19/2011   Essential hypertension 03/19/2011   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   Leukocytopenia    Nausea alone 07/28/2013    Neck pain 01/21/2012   Normal MRI 07/14/2011   negative for mestasis    Pneumonia 2012   Sciatica    Seizures ([place])    07/24/11 off Effexor w/o seizure   Sepsis ([place]) 08/04/2012   Sinusitis, chronic 07/20/2011   Bilateral maxillary, identified on MRI of head 07/14/11.     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[place])     Family History  Problem Relation Age of Onset   Heart disease Mother    Heart disease Father    Heart disease Sister    Cancer Brother        type unknown   Liver disease Maternal Grandmother    Cancer Sister        type unknown   Diabetes Sister    Breast cancer Neg Hx     Past Surgical History:  Procedure Laterality Date   BREAST LUMPECTOMY Right 07/06/2018   BREAST LUMPECTOMY WITH RADIOACTIVE SEED LOCALIZATION Right 07/06/2018   Procedure: RIGHT BREAST LUMPECTOMY WITH RADIOACTIVE SEED LOCALIZATION;  Surgeon: Rolm Bookbinder, MD;  Location: [facility];  Service: General;  Laterality: Right;   COLONOSCOPY N/A 11/15/2012   Procedure: COLONOSCOPY;  Surgeon: Lafayette Dragon, MD;  [facility];  Service: Endoscopy;  Laterality: N/A;   CRYOTHERAPY  05/11/2021   Procedure: CRYOTHERAPY;  Surgeon: Garner Nash, DO;  Location: [facility];  Service: Pulmonary;;   DENTAL RESTORATION/EXTRACTION WITH X-RAY     ESOPHAGEAL DILATION N/A 09/09/2019   Procedure: ESOPHAGEAL DILATION;  Surgeon: [name], MD;  Location: [facility];  Service: ENT;  Laterality: N/A;   ESOPHAGEAL DILATION N/A 10/05/2019   Procedure: ESOPHAGEAL DILATION;  Surgeon: [name], MD;  Location: [facility];  Service: ENT;  Laterality: N/A;  via tracheostomy   ESOPHAGEAL DILATION N/A 02/27/2020   Procedure: ESOPHAGEAL DILATION;  Surgeon: [name], MD;  Location: [facility];  Service: ENT;  Laterality: N/A;   ESOPHAGOGASTRODUODENOSCOPY (EGD) WITH PROPOFOL N/A 08/12/2018   Procedure: ESOPHAGOGASTRODUODENOSCOPY (EGD) WITH PROPOFOL;  Surgeon: Doran Stabler, MD;   Location: [facility];  Service: Gastroenterology;  Laterality: N/A;   ESOPHAGOSCOPY  06/21/2012   Procedure: ESOPHAGOSCOPY;  Surgeon: [name], MD;  Location: [facility];  Service: ENT;  Laterality: N/A;   ESOPHAGOSCOPY WITH DILITATION N/A 09/21/2014   Procedure: ESOPHAGOSCOPY WITH DILITATION;  Surgeon: [name], MD;  Location: [facility];  Service: ENT;  Laterality: N/A;   ESOPHAGOSCOPY WITH DILITATION N/A 07/04/2016   Procedure: ESOPHAGOSCOPY WITH DILITATION;  Surgeon: [name], MD;  Location: [facility];  Service: ENT;  Laterality: N/A;   ESOPHAGOSCOPY WITH DILITATION N/A 12/01/2017   Procedure: ESOPHAGOSCOPY WITH DILITATION;  Surgeon:  [name], MD;  Location: [facility];  Service: ENT;  Laterality: N/A;   ESOPHAGOSCOPY WITH DILITATION N/A 04/19/2018   Procedure: ESOPHAGOSCOPY WITH DILITATION;  Surgeon: [name], MD;  Location: [facility];  Service: ENT;  Laterality: N/A;   ESOPHAGOSCOPY WITH DILITATION N/A 03/07/2019   Procedure: Esophagoscopy with dilatation;  Surgeon: [name], MD;  Location: [facility];  Service: ENT;  Laterality: N/A;   ESOPHAGOSCOPY WITH DILITATION N/A 07/16/2020   Procedure: ESOPHAGOSCOPY WITH DILATION;  Surgeon: [name], MD;  Location: [facility];  Service: ENT;  Laterality: N/A;   [place] N/A 10/08/2020   Procedure: ESOPHAGOSCOPY With Dilation;  Surgeon: [name], MD;  Location: [facility];  Service: ENT;  Laterality: N/A;  [address] [place]  01/08/2018       FOREIGN BODY REMOVAL  05/11/2021   Procedure: FOREIGN BODY REMOVAL;  Surgeon: Garner Nash, DO;  Location: [facility];  Service: Pulmonary;;   FOREIGN BODY REMOVAL BRONCHIAL  10/02/2011   Procedure: REMOVAL FOREIGN BODY BRONCHIAL;  Surgeon: Ruby Cola, MD;  Location: [facility];  Service: ENT;  Laterality: N/A;   FOREIGN BODY REMOVAL BRONCHIAL N/A 01/08/2018   Procedure: REMOVAL FOREIGN BODY  BRONCHIAL;  Surgeon: Jodi Marble, MD;  [facility];  Service: ENT;  Laterality: N/A;   HEMOSTASIS CONTROL  05/11/2021   Procedure: HEMOSTASIS CONTROL;  Surgeon: Garner Nash, DO;  Location: [facility];  Service: Pulmonary;;   LARYNGECTOMY     [place]a cath removal     PORTACATH PLACEMENT  09/17/10   Tip in cavoatrial junction   RE-EXCISION OF BREAST CANCER,SUPERIOR MARGINS Right 07/29/2018   Procedure: RE-EXCISION OF RIGHT BREAST MEDIAL MARGINS;  Surgeon: Rolm Bookbinder, MD;  Location: [facility];  Service: General;  Laterality: Right;   RIGID ESOPHAGOSCOPY N/A 03/19/2019   Procedure: FLEXIBLE ESOPHAGOSCOPY;  Surgeon: Jodi Marble, MD;  Location: [facility];  Service: ENT;  Laterality: N/A;   STOMAPLASTY N/A 10/21/2016   Procedure: Zola Button;  Surgeon: [name], MD;  Location: [facility];  Service: ENT;  Laterality: N/A;   TOTAL KNEE ARTHROPLASTY Right 12/12/2019   Procedure: RIGHT TOTAL KNEE ARTHROPLASTY;  Surgeon: Leandrew Koyanagi, MD;  Location: [facility];  Service: Orthopedics;  Laterality: Right;   TOTAL KNEE ARTHROPLASTY Left 04/01/2021   Procedure: LEFT TOTAL KNEE ARTHROPLASTY;  Surgeon: Leandrew Koyanagi, MD;  Location: [facility];  Service: Orthopedics;  Laterality: Left;   TRACHEAL DILITATION  07/16/2011   Procedure: TRACHEAL DILITATION;  Surgeon: Beckie Salts, MD;  Location: [facility];  Service: ENT;  Laterality: N/A;  dilation of tracheal stoma and replacement of stoma tube   [place] Bilateral 05/11/2021   Procedure: VIDEO BRONCHOSCOPY WITHOUT FLUORO;  Surgeon: Garner Nash, DO;  Location: [facility];  Service: Pulmonary;  Laterality: Bilateral;  w/ cryotherapy, Foreign body extraction   Social History   Occupational History   Occupation: part time    Comment: warehouse work  Tobacco Use   Smoking status: Former    Packs/day: 2.00    Years: 20.00    Pack years: 40.00    Types: Cigarettes    Quit date: 09/17/2010    Years since quitting: 10.7    Smokeless tobacco: Never  Vaping Use   Vaping Use: Never used  Substance and Sexual Activity   Alcohol use: Yes    Comment: Socially drinks    Drug use:  No    Comment: tried cocaine 1 time 2010 only used 1 time   Sexual activity: Yes    Birth control/protection: Surgical

## 2021-06-28 ENCOUNTER — Telehealth: Payer: Self-pay | Admitting: Orthopaedic Surgery

## 2021-06-28 NOTE — Telephone Encounter (Signed)
I called and talked to the pt. She isnt ready to return to work yet. She would like to wait until after PT. Ok to continue out of work note until next f/u?

## 2021-06-28 NOTE — Telephone Encounter (Signed)
Is she ready for full duty?

## 2021-06-28 NOTE — Telephone Encounter (Signed)
Please advise of patients work status. Thanks!

## 2021-06-28 NOTE — Telephone Encounter (Signed)
yes

## 2021-07-01 NOTE — Telephone Encounter (Signed)
Documented

## 2021-07-01 NOTE — Telephone Encounter (Signed)
Work note in chart

## 2021-07-03 ENCOUNTER — Ambulatory Visit: Payer: Medicaid Other | Admitting: Physical Therapy

## 2021-07-03 ENCOUNTER — Encounter: Payer: Self-pay | Admitting: Physical Therapy

## 2021-07-03 ENCOUNTER — Other Ambulatory Visit: Payer: Self-pay

## 2021-07-03 DIAGNOSIS — M6281 Muscle weakness (generalized): Secondary | ICD-10-CM | POA: Diagnosis not present

## 2021-07-03 DIAGNOSIS — M25562 Pain in left knee: Secondary | ICD-10-CM | POA: Diagnosis not present

## 2021-07-03 DIAGNOSIS — R2689 Other abnormalities of gait and mobility: Secondary | ICD-10-CM

## 2021-07-03 NOTE — Therapy (Signed)
[place], [place], [address] Phone: [phone]   Fax:  [phone]  Physical Therapy Treatment  Patient Details  Name: Deborah [name] MRN: [id number] Date of Birth: [birth year] Referring Provider (PT): Leandrew Koyanagi, MD   Encounter Date: 07/03/2021   PT End of Session - 07/03/21 1354     Visit Number 12    Number of Visits 16    Date for PT Re-Evaluation 08/01/21   extended   Authorization Type Approved 12 PT visits from 05/23/21-07/16/2021    PT Start Time 6010    PT Stop Time 1438    PT Time Calculation (min) 43 min    Activity Tolerance Patient tolerated treatment well    Behavior During Therapy [facility] for tasks assessed/performed             Past Medical History:  Diagnosis Date   Anemia    h/o of   Anxiety    Arthritis    in knees   Asthma    Breast cancer ([place])    Bronchitis    COPD (chronic obstructive pulmonary disease) ([place])    Diverticulosis 11/15/2012   noted on screening colonoscopy    Dyspnea    with COPD exerbation   Esophageal stricture    Former smoker 03/19/2011   GERD (gastroesophageal reflux disease)    Heart murmur    asymptomatic    History of laryngectomy    History of radiation therapy 11/15/18- 12/06/18   Right Breast total dose 42.56 Gy in 16 fractions.    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   Leukocytopenia    Nausea alone 07/28/2013   Neck pain 01/21/2012   Normal MRI 07/14/2011   negative for mestasis    Pneumonia 2012   Sciatica    Seizures ([place])    07/24/11 off Effexor w/o seizure   Sepsis ([place]) 08/04/2012   Sinusitis, chronic 07/20/2011   Bilateral maxillary, identified on MRI of head 07/14/11.      Tracheostomy dependent [facility])     Past Surgical History:  Procedure Laterality Date   BREAST LUMPECTOMY Right 07/06/2018   BREAST LUMPECTOMY WITH RADIOACTIVE SEED LOCALIZATION Right 07/06/2018   Procedure: RIGHT BREAST LUMPECTOMY WITH RADIOACTIVE SEED LOCALIZATION;  Surgeon: Rolm Bookbinder, MD;  Location: [place];  Service: General;  Laterality: Right;   COLONOSCOPY N/A 11/15/2012   Procedure: COLONOSCOPY;  Surgeon: Lafayette Dragon, MD;  [facility];  Service: Endoscopy;  Laterality: N/A;   CRYOTHERAPY  05/11/2021   Procedure: CRYOTHERAPY;  Surgeon: Garner Nash, DO;  Location: [place] [facility];  Service: Pulmonary;;   DENTAL RESTORATION/EXTRACTION WITH X-RAY     ESOPHAGEAL DILATION N/A 09/09/2019   Procedure: ESOPHAGEAL DILATION;  Surgeon: [name], MD;  Location: [place];  Service: ENT;  Laterality: N/A;   ESOPHAGEAL DILATION N/A 10/05/2019   Procedure: ESOPHAGEAL DILATION;  Surgeon: [name], MD;  Location: [place];  Service: ENT;  Laterality: N/A;  via tracheostomy   ESOPHAGEAL DILATION N/A 02/27/2020   Procedure: ESOPHAGEAL DILATION;  Surgeon: [name], MD;  Location: [place];  Service: ENT;  Laterality: N/A;   ESOPHAGOGASTRODUODENOSCOPY (EGD) WITH PROPOFOL N/A 08/12/2018   Procedure:  ESOPHAGOGASTRODUODENOSCOPY (EGD) WITH PROPOFOL;  Surgeon: Doran Stabler, MD;  Location: [place];  Service: Gastroenterology;  Laterality: N/A;   ESOPHAGOSCOPY  06/21/2012   Procedure: ESOPHAGOSCOPY;  Surgeon: [name], MD;  Location: [place];  Service: ENT;  Laterality: N/A;   ESOPHAGOSCOPY N/A 06/24/2021   Procedure: ESOPHAGOSCOPY WITH DILATATION;  Surgeon: [name], MD;  Location: [place];  Service: ENT;  Laterality: N/A;   ESOPHAGOSCOPY WITH DILITATION N/A 09/21/2014   Procedure: ESOPHAGOSCOPY WITH DILITATION;  Surgeon: [name], MD;  Location: [place];  Service: ENT;  Laterality: N/A;    ESOPHAGOSCOPY WITH DILITATION N/A 07/04/2016   Procedure: ESOPHAGOSCOPY WITH DILITATION;  Surgeon: [name], MD;  Location: [place];  Service: ENT;  Laterality: N/A;   ESOPHAGOSCOPY WITH DILITATION N/A 12/01/2017   Procedure: ESOPHAGOSCOPY WITH DILITATION;  Surgeon: [name], MD;  Location: [place];  Service: ENT;  Laterality: N/A;   ESOPHAGOSCOPY WITH DILITATION N/A 04/19/2018   Procedure: ESOPHAGOSCOPY WITH DILITATION;  Surgeon: [name], MD;  Location: [place];  Service: ENT;  Laterality: N/A;   ESOPHAGOSCOPY WITH DILITATION N/A 03/07/2019   Procedure: Esophagoscopy with dilatation;  Surgeon: [name], MD;  Location: [place];  Service: ENT;  Laterality: N/A;   ESOPHAGOSCOPY WITH DILITATION N/A 07/16/2020   Procedure: ESOPHAGOSCOPY WITH DILATION;  Surgeon: [name], MD;  Location: [place];  Service: ENT;  Laterality: N/A;   [place] N/A 10/08/2020   Procedure: ESOPHAGOSCOPY With Dilation;  Surgeon: [name], MD;  Location: [place];  Service: ENT;  Laterality: N/A;  21 [place] to [place]  01/08/2018       FOREIGN BODY REMOVAL  05/11/2021   Procedure: FOREIGN BODY REMOVAL;  Surgeon: Garner Nash, DO;  Location: [place];  Service: Pulmonary;;   FOREIGN BODY REMOVAL BRONCHIAL  10/02/2011   Procedure: REMOVAL FOREIGN BODY BRONCHIAL;  Surgeon: Ruby Cola, MD;  Location: [place];  Service: ENT;  Laterality: N/A;   FOREIGN BODY REMOVAL BRONCHIAL N/A 01/08/2018   Procedure: REMOVAL FOREIGN BODY BRONCHIAL;  Surgeon: Jodi Marble, MD;  [facility];  Service: ENT;  Laterality: N/A;   HEMOSTASIS CONTROL  05/11/2021   Procedure: HEMOSTASIS CONTROL;  Surgeon: Garner Nash, DO;  Location: [place];  Service: Pulmonary;;   LARYNGECTOMY     Porta cath removal     PORTACATH PLACEMENT  09/17/10   Tip in cavoatrial junction   RE-EXCISION OF BREAST CANCER,SUPERIOR MARGINS Right  07/29/2018   Procedure: RE-EXCISION OF RIGHT BREAST MEDIAL MARGINS;  Surgeon: Rolm Bookbinder, MD;  Location: [place];  Service: General;  Laterality: Right;   RIGID ESOPHAGOSCOPY N/A 03/19/2019   Procedure: FLEXIBLE ESOPHAGOSCOPY;  Surgeon: Jodi Marble, MD;  Location: [place];  Service: ENT;  Laterality: N/A;   STOMAPLASTY N/A 10/21/2016   Procedure: Zola Button;  Surgeon: [name], MD;  Location: [place];  Service: ENT;  Laterality: N/A;   TOTAL KNEE ARTHROPLASTY Right 12/12/2019   Procedure: RIGHT TOTAL KNEE ARTHROPLASTY;  Surgeon: Leandrew Koyanagi, MD;  Location: [place];  Service: Orthopedics;  Laterality: Right;   TOTAL KNEE ARTHROPLASTY Left 04/01/2021   Procedure: LEFT TOTAL KNEE ARTHROPLASTY;  Surgeon: Leandrew Koyanagi, MD;  Location: [place];  Service: Orthopedics;  Laterality: Left;   TRACHEAL DILITATION  07/16/2011   Procedure: TRACHEAL DILITATION;  Surgeon: Beckie Salts, MD;  Location: [place];  Service: ENT;  Laterality: N/A;  dilation of tracheal stoma and replacement of stoma tube   TUBAL LIGATION  1982   VIDEO BRONCHOSCOPY Bilateral 05/11/2021   Procedure: VIDEO BRONCHOSCOPY WITHOUT FLUORO;  Surgeon: Garner Nash, DO;  Location: [place];  Service: Pulmonary;  Laterality: Bilateral;  w/ cryotherapy, Foreign body extraction    There were no vitals filed for this visit.   Subjective Assessment - 07/03/21 1358     Subjective Pt reports that her knee is doing ok.  She feels it is stiff, but is currenlty not having any pain. 0/10 knee pain.             [place] Adult PT Treatment/Exercise:   Therapeutic Exercise:  NuStep lvl 5 LE x 6 min while taking subjective Slant board stretch 45''x 3 Step up 6'' step - 2x10 Knee ext machine - 10x 5, 10, 15, 10, 5  Lateral walking with RTB - 4 laps Hip ext with RTB - 2x10 ea Squats with UE support - 3x10   Neuromuscular re-ed, to improve balance and reduce fall risk: - tandem on foam 45'' bouts - semi tandem (100%) with EC     PT  Short Term Goals - 05/28/21 1025       PT SHORT TERM GOAL #1   Title Jasani will be >75% HEP compliant to improve carryover between sessions and facilitate independent management of condition    Status Achieved    Target Date 05/25/21               PT Long Term Goals - 06/20/21 1612       PT LONG TERM GOAL #1   Title Target date for all long term goals:  08/01/20      PT LONG TERM GOAL #2   Title Stanton Kidney will improve KOOS, Jr. score from 42 (on evaluation) to 62 as a proxy for functional improvement    Baseline 1/5 47.49    Status On-going      PT LONG TERM GOAL #3   Title Carloyn will achieve 3 degrees knee extension by D/C (see POC end date) to improve stability in stance phase of gait    EVAL: 7 degrees    Baseline 1/5: full Ext    Status Achieved      PT LONG TERM GOAL #4   Title Betsabe will achieve 100 degrees knee flexion by D/C (see POC end date) to improve ability to safely navigate steps in the community    EVAL: 80 degrees    Baseline 1/5: MET    Status Achieved      PT LONG TERM GOAL #5   Title Aurilla will improve 30'' STS (MCID 2) to >/= 8x (w/ UE?: n) to show improved LE strength and improved transfers    EVAL: 5x  w/ UE? Y    Baseline 1/5: 7x w/ UE    Status On-going      PT LONG TERM GOAL #6   Title Kimberlyann will be able to put shoes and socks on, not limited by pain    EVAL: limited    Baseline 1/5: progressing, still challenging    Status On-going                   Plan - 07/03/21 1436     Clinical Impression Statement Kacey is progressing well with therapy.  Pt reports a mild increase in pain following therapy.  Today we concentrated on quad strengthening and hip strengthening.  Pt able to resume high  intensity quad strengthening today with no issue.  Shows glute compensation during step up which improves somewhat with cuing.  Pt will continue to benefit from skilled physical therapy to address remaining deficits and achieve listed goals.  Continue per POC.     PT Frequency 2x / week    PT Duration 6 weeks    PT Treatment/Interventions ADLs/Self Care Home Management;Aquatic Therapy;Electrical Stimulation;Iontophoresis 33m/ml Dexamethasone;Gait training;Therapeutic activities;Therapeutic exercise;Neuromuscular re-education;Manual techniques;Dry needling;Vasopneumatic Device    PT Next Visit Plan progress L knee strength and ROM as able    PT [facility]            Patient will benefit from skilled therapeutic intervention in order to improve the following deficits and impairments:  Abnormal gait, Decreased balance, Difficulty walking, Pain, Impaired flexibility, Decreased strength, Decreased range of motion  Visit Diagnosis: Left knee pain, unspecified chronicity  Muscle weakness (generalized)  Other abnormalities of gait and mobility     Problem List Patient Active Problem List   Diagnosis Date Noted   FB bronchus    Status post total left knee replacement 04/01/2021   Primary osteoarthritis of left knee 03/31/2021   Peripheral polyneuropathy 12/12/2020   Status post total right knee replacement 12/12/2019   Primary osteoarthritis of right knee 12/11/2019   Localized swelling, mass and lump, neck 07/20/2019   Allergic rhinitis 05/20/2019   GERD (gastroesophageal reflux disease)    COPD (chronic obstructive pulmonary disease) (HCC)    Chronic diastolic (congestive) heart failure ([place]    Breast cancer ([place] 07/06/2018   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   Major depressive disorder 04/08/2018   Carpal tunnel syndrome 03/16/2018   Obesity 08/24/2017   Plantar fasciitis 06/06/2016   Low back pain 05/21/2016   Osteoarthritis of knees, bilateral 05/31/2015   Knee pain 01/04/2015   Dysphagia 08/16/2014   Weight gain 08/05/2014   DOE (dyspnea on exertion) 04/28/2014   Frequent falls 12/23/2013   Anemia in chronic illness 08/11/2012   Status post trachelectomy 08/04/2012   Chronic pain  01/20/2012   History of head and neck cancer 09/26/2011   Hx of radiation therapy    Tracheitis 07/17/2011   [place] ([place] 07/17/2011   Hypothyroidism 03/19/2011   Essential hypertension 03/19/2011   Larynx cancer ([place] 07/31/2010    KMathis Dad PT 07/03/2021, 2:38 PM  [facility] [address]Phone: [phone]  Fax:  [phone] Name: MNadine RyleMRN: [id number] of Birth: 7Jun 19, 1957

## 2021-07-05 ENCOUNTER — Other Ambulatory Visit: Payer: Self-pay | Admitting: Family Medicine

## 2021-07-08 ENCOUNTER — Ambulatory Visit
Admission: RE | Admit: 2021-07-08 | Discharge: 2021-07-08 | Disposition: A | Discharge status: Home / Self Care | Payer: Medicaid Other | Source: Ambulatory Visit | Attending: Family Medicine | Admitting: Family Medicine

## 2021-07-08 ENCOUNTER — Other Ambulatory Visit: Payer: Self-pay | Admitting: Family Medicine

## 2021-07-08 DIAGNOSIS — Z1231 Encounter for screening mammogram for malignant neoplasm of breast: Secondary | ICD-10-CM | POA: Diagnosis not present

## 2021-07-08 DIAGNOSIS — Z139 Encounter for screening, unspecified: Secondary | ICD-10-CM

## 2021-07-10 ENCOUNTER — Other Ambulatory Visit: Payer: Self-pay

## 2021-07-10 ENCOUNTER — Encounter: Payer: Self-pay | Admitting: Physical Therapy

## 2021-07-10 ENCOUNTER — Ambulatory Visit: Payer: Medicaid Other | Admitting: Physical Therapy

## 2021-07-10 DIAGNOSIS — M25562 Pain in left knee: Secondary | ICD-10-CM | POA: Diagnosis not present

## 2021-07-10 DIAGNOSIS — M6281 Muscle weakness (generalized): Secondary | ICD-10-CM | POA: Diagnosis not present

## 2021-07-10 DIAGNOSIS — R2689 Other abnormalities of gait and mobility: Secondary | ICD-10-CM | POA: Diagnosis not present

## 2021-07-10 NOTE — Therapy (Signed)
[place], [place], [address] Phone: [phone]   Fax:  [phone]  Physical Therapy Treatment  Patient Details  Name: Deborah [name] MRN: [id number] Date of Birth: [birth year] Referring Provider (PT): Leandrew Koyanagi, MD   Encounter Date: 07/10/2021   PT End of Session - 07/10/21 1401     Visit Number 13    Number of Visits 16    Date for PT Re-Evaluation 08/01/21   extended   Authorization Type Approved 12 PT visits from 05/23/21-07/16/2021    PT Start Time 1400    PT Stop Time 1440    PT Time Calculation (min) 40 min    Activity Tolerance Patient tolerated treatment well    Behavior During Therapy [facility] for tasks assessed/performed             Past Medical History:  Diagnosis Date   Anemia    h/o of   Anxiety    Arthritis    in knees   Asthma    Breast cancer ([place])    Bronchitis    COPD (chronic obstructive pulmonary disease) ([place])    Diverticulosis 11/15/2012   noted on screening colonoscopy    Dyspnea    with COPD exerbation   Esophageal stricture    Former smoker 03/19/2011   GERD (gastroesophageal reflux disease)    Heart murmur    asymptomatic    History of laryngectomy    History of radiation therapy 11/15/18- 12/06/18   Right Breast total dose 42.56 Gy in 16 fractions.    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   Leukocytopenia    Nausea alone 07/28/2013   Neck pain 01/21/2012   Normal MRI 07/14/2011   negative for mestasis    Pneumonia 2012   Sciatica    Seizures ([place])    07/24/11 off Effexor w/o seizure   Sepsis ([place]) 08/04/2012   Sinusitis, chronic 07/20/2011   Bilateral maxillary, identified on MRI of head 07/14/11.      Tracheostomy dependent [facility])     Past Surgical History:  Procedure Laterality Date   BREAST LUMPECTOMY Right 07/06/2018   BREAST LUMPECTOMY WITH RADIOACTIVE SEED LOCALIZATION Right 07/06/2018   Procedure: RIGHT BREAST LUMPECTOMY WITH RADIOACTIVE SEED LOCALIZATION;  Surgeon: Rolm Bookbinder, MD;  Location: [place];  Service: General;  Laterality: Right;   COLONOSCOPY N/A 11/15/2012   Procedure: COLONOSCOPY;  Surgeon: Lafayette Dragon, MD;  [facility];  Service: Endoscopy;  Laterality: N/A;   CRYOTHERAPY  05/11/2021   Procedure: CRYOTHERAPY;  Surgeon: Garner Nash, DO;  Location: [place] [facility];  Service: Pulmonary;;   DENTAL RESTORATION/EXTRACTION WITH X-RAY     ESOPHAGEAL DILATION N/A 09/09/2019   Procedure: ESOPHAGEAL DILATION;  Surgeon: [name], MD;  Location: [place];  Service: ENT;  Laterality: N/A;   ESOPHAGEAL DILATION N/A 10/05/2019   Procedure: ESOPHAGEAL DILATION;  Surgeon: [name], MD;  Location: [place];  Service: ENT;  Laterality: N/A;  via tracheostomy   ESOPHAGEAL DILATION N/A 02/27/2020   Procedure: ESOPHAGEAL DILATION;  Surgeon: [name], MD;  Location: [place];  Service: ENT;  Laterality: N/A;   ESOPHAGOGASTRODUODENOSCOPY (EGD) WITH PROPOFOL N/A 08/12/2018   Procedure:  ESOPHAGOGASTRODUODENOSCOPY (EGD) WITH PROPOFOL;  Surgeon: Doran Stabler, MD;  Location: [place];  Service: Gastroenterology;  Laterality: N/A;   ESOPHAGOSCOPY  06/21/2012   Procedure: ESOPHAGOSCOPY;  Surgeon: [name], MD;  Location: [place];  Service: ENT;  Laterality: N/A;   ESOPHAGOSCOPY N/A 06/24/2021   Procedure: ESOPHAGOSCOPY WITH DILATATION;  Surgeon: [name], MD;  Location: [place];  Service: ENT;  Laterality: N/A;   ESOPHAGOSCOPY WITH DILITATION N/A 09/21/2014   Procedure: ESOPHAGOSCOPY WITH DILITATION;  Surgeon: [name], MD;  Location: [place];  Service: ENT;  Laterality: N/A;    ESOPHAGOSCOPY WITH DILITATION N/A 07/04/2016   Procedure: ESOPHAGOSCOPY WITH DILITATION;  Surgeon: [name], MD;  Location: [place];  Service: ENT;  Laterality: N/A;   ESOPHAGOSCOPY WITH DILITATION N/A 12/01/2017   Procedure: ESOPHAGOSCOPY WITH DILITATION;  Surgeon: [name], MD;  Location: [place];  Service: ENT;  Laterality: N/A;   ESOPHAGOSCOPY WITH DILITATION N/A 04/19/2018   Procedure: ESOPHAGOSCOPY WITH DILITATION;  Surgeon: [name], MD;  Location: [place];  Service: ENT;  Laterality: N/A;   ESOPHAGOSCOPY WITH DILITATION N/A 03/07/2019   Procedure: Esophagoscopy with dilatation;  Surgeon: [name], MD;  Location: [place];  Service: ENT;  Laterality: N/A;   ESOPHAGOSCOPY WITH DILITATION N/A 07/16/2020   Procedure: ESOPHAGOSCOPY WITH DILATION;  Surgeon: [name], MD;  Location: [place];  Service: ENT;  Laterality: N/A;   [place] N/A 10/08/2020   Procedure: ESOPHAGOSCOPY With Dilation;  Surgeon: [name], MD;  Location: [place];  Service: ENT;  Laterality: N/A;  21 [place] to [place]  01/08/2018       FOREIGN BODY REMOVAL  05/11/2021   Procedure: FOREIGN BODY REMOVAL;  Surgeon: Garner Nash, DO;  Location: [place];  Service: Pulmonary;;   FOREIGN BODY REMOVAL BRONCHIAL  10/02/2011   Procedure: REMOVAL FOREIGN BODY BRONCHIAL;  Surgeon: Ruby Cola, MD;  Location: [place];  Service: ENT;  Laterality: N/A;   FOREIGN BODY REMOVAL BRONCHIAL N/A 01/08/2018   Procedure: REMOVAL FOREIGN BODY BRONCHIAL;  Surgeon: Jodi Marble, MD;  [facility];  Service: ENT;  Laterality: N/A;   HEMOSTASIS CONTROL  05/11/2021   Procedure: HEMOSTASIS CONTROL;  Surgeon: Garner Nash, DO;  Location: [place];  Service: Pulmonary;;   LARYNGECTOMY     Porta cath removal     PORTACATH PLACEMENT  09/17/10   Tip in cavoatrial junction   RE-EXCISION OF BREAST CANCER,SUPERIOR MARGINS Right  07/29/2018   Procedure: RE-EXCISION OF RIGHT BREAST MEDIAL MARGINS;  Surgeon: Rolm Bookbinder, MD;  Location: [place];  Service: General;  Laterality: Right;   RIGID ESOPHAGOSCOPY N/A 03/19/2019   Procedure: FLEXIBLE ESOPHAGOSCOPY;  Surgeon: Jodi Marble, MD;  Location: [place];  Service: ENT;  Laterality: N/A;   STOMAPLASTY N/A 10/21/2016   Procedure: Zola Button;  Surgeon: [name], MD;  Location: [place];  Service: ENT;  Laterality: N/A;   TOTAL KNEE ARTHROPLASTY Right 12/12/2019   Procedure: RIGHT TOTAL KNEE ARTHROPLASTY;  Surgeon: Leandrew Koyanagi, MD;  Location: [place];  Service: Orthopedics;  Laterality: Right;   TOTAL KNEE ARTHROPLASTY Left 04/01/2021   Procedure: LEFT TOTAL KNEE ARTHROPLASTY;  Surgeon: Leandrew Koyanagi, MD;  Location: [place];  Service: Orthopedics;  Laterality: Left;   TRACHEAL DILITATION  07/16/2011   Procedure: TRACHEAL DILITATION;  Surgeon: Beckie Salts, MD;  Location: [place];  Service: ENT;  Laterality: N/A;  dilation of tracheal stoma and replacement of stoma tube   TUBAL LIGATION  1982   VIDEO BRONCHOSCOPY Bilateral 05/11/2021   Procedure: VIDEO BRONCHOSCOPY WITHOUT FLUORO;  Surgeon: Garner Nash, DO;  Location: [place];  Service: Pulmonary;  Laterality: Bilateral;  w/ cryotherapy, Foreign body extraction    There were no vitals filed for this visit.   Subjective Assessment - 07/10/21 1405     Subjective Pt reports that her knee continues to be stiff, but that overall she has been doing well. She has been HEP compliant. 0/10 knee pain.            Objective:  110 degrees knee flexion  [facility] Adult PT Treatment/Exercise:   Therapeutic Exercise:  NuStep lvl 5 LE x 6 min while taking subjective Slant board stretch 45''x 3 Step stretch for flexion - 20x Step up 6'' step fwd and lat- 2x10 (cues for form) Knee ext machine - 7x , 10, 15, 20, 15, 10 (HOLD) Lateral walking with GTB - 4 laps Hip ext with RTB - 2x10 ea    Neuromuscular re-ed, to improve  balance and reduce fall risk: - tandem on foam 45'' bouts - semi tandem (100%) with EC      PT Short Term Goals - 05/28/21 1025       PT SHORT TERM GOAL #1   Title Annaelle will be >75% HEP compliant to improve carryover between sessions and facilitate independent management of condition    Status Achieved    Target Date 05/25/21               PT Long Term Goals - 06/20/21 1612       PT LONG TERM GOAL #1   Title Target date for all long term goals:  08/01/20      PT LONG TERM GOAL #2   Title Stanton Kidney will improve KOOS, Jr. score from 42 (on evaluation) to 62 as a proxy for functional improvement    Baseline 1/5 47.49    Status On-going      PT LONG TERM GOAL #3   Title Tiwanna will achieve 3 degrees knee extension by D/C (see POC end date) to improve stability in stance phase of gait    EVAL: 7 degrees    Baseline 1/5: full Ext    Status Achieved      PT LONG TERM GOAL #4   Title Salisha will achieve 100 degrees knee flexion by D/C (see POC end date) to improve ability to safely navigate steps in the community    EVAL: 80 degrees    Baseline 1/5: MET    Status Achieved      PT LONG TERM GOAL #5   Title Everli will improve 30'' STS (MCID 2) to >/= 8x (w/ UE?: n) to show improved LE strength and improved transfers    EVAL: 5x  w/ UE? Y    Baseline 1/5: 7x w/ UE    Status On-going      PT LONG TERM GOAL #6   Title Ruthell will be able to put shoes and socks on, not limited by pain    EVAL: limited    Baseline 1/5: progressing, still challenging    Status On-going                   Plan - 07/10/21 1431     Clinical Impression Statement Deborah [name] is progressing well with therapy.  Pt reports no increase in baseline pain following therapy.  Today we concentrated on knee strengthening and hip strengthening.  Pt continues to improve her knee function.  She has met her 110 degree ROM goal.  She does continues to show straight leg compensation and use of momentum to achieve 6'' step up  which improves but is not eliminated with cuing.  Pt will continue to benefit from skilled physical therapy to address remaining deficits and achieve listed goals.  Continue per POC.              Patient will benefit from skilled therapeutic intervention in order to improve the following deficits and impairments:     Visit Diagnosis: Left knee pain, unspecified chronicity  Muscle weakness (generalized)     Problem List Patient Active Problem List   Diagnosis Date Noted   FB bronchus    Status post total left knee replacement 04/01/2021   Primary osteoarthritis of left knee 03/31/2021   Peripheral polyneuropathy 12/12/2020   Status post total right knee replacement 12/12/2019   Primary osteoarthritis of right knee 12/11/2019   Localized swelling, mass and lump, neck 07/20/2019   Allergic rhinitis 05/20/2019   GERD (gastroesophageal reflux disease)    COPD (chronic obstructive pulmonary disease) (HCC)    Chronic diastolic (congestive) heart failure ([place])    Breast cancer ([place]) 07/06/2018   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   Major depressive disorder 04/08/2018   Carpal tunnel syndrome 03/16/2018   Obesity 08/24/2017   Plantar fasciitis 06/06/2016   Low back pain 05/21/2016   Osteoarthritis of knees, bilateral 05/31/2015   Knee pain 01/04/2015   Dysphagia 08/16/2014   Weight gain 08/05/2014   DOE (dyspnea on exertion) 04/28/2014   Frequent falls 12/23/2013   Anemia in chronic illness 08/11/2012   Status post trachelectomy 08/04/2012   Chronic pain 01/20/2012   History of head and neck cancer 09/26/2011   Hx of radiation therapy    Tracheitis 07/17/2011   Seizure ([place]) 07/17/2011   Hypothyroidism 03/19/2011   Essential hypertension 03/19/2011   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

## 2021-07-16 ENCOUNTER — Other Ambulatory Visit: Payer: Self-pay

## 2021-07-16 ENCOUNTER — Ambulatory Visit (INDEPENDENT_AMBULATORY_CARE_PROVIDER_SITE_OTHER): Payer: Medicaid Other | Admitting: Pulmonary Disease

## 2021-07-16 ENCOUNTER — Encounter: Payer: Self-pay | Admitting: Pulmonary Disease

## 2021-07-16 ENCOUNTER — Other Ambulatory Visit: Payer: Self-pay | Admitting: Pulmonary Disease

## 2021-07-16 VITALS — BP 138/84 | HR 89 | Ht 67.0 in | Wt 241.0 lb

## 2021-07-16 DIAGNOSIS — R0982 Postnasal drip: Secondary | ICD-10-CM

## 2021-07-16 DIAGNOSIS — J4540 Moderate persistent asthma, uncomplicated: Secondary | ICD-10-CM | POA: Diagnosis not present

## 2021-07-16 DIAGNOSIS — T17508D Unspecified foreign body in bronchus causing other injury, subsequent encounter: Secondary | ICD-10-CM

## 2021-07-16 MED ORDER — SODIUM CHLORIDE 3 % IN NEBU: INHALATION_SOLUTION | RESPIRATORY_TRACT | 12 refills | Status: DC | PRN

## 2021-07-16 NOTE — Telephone Encounter (Signed)
We received a message from the pharmcy that the 3% hypertonic saline is on back order. Please advise of an alternative.

## 2021-07-16 NOTE — Progress Notes (Signed)
Synopsis: Referred in December 2022 for Foreign Body in Bronchus by [name]  Subjective:   PATIENT ID: Deborah [name] GENDER: female DOB: Nov 07, 1955, MRN: [id number]  HPI  Chief Complaint  Patient presents with   Follow-up    F/U on increased SOB at night for the past 2 weeks. She has waking up gasping for air. Not currently using O2 at home.    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   She called to be seen sooner due to coughing spells of recent that wake her from her sleep. She has significant coughing spell that will lead to shortness of breath and difficulty clearing mucous. She denies fevers, chills or sweats.  She was started on flonase nasal spray and ipratropium nasal spray at last visit with some improvement in her sinus congestion but she continues to have a lot of mucous. She is using dulera and spiriva inhalers along with duoneb nebulizer treatments.   The mucous is clear to whitish with occasional blood tinged streaks.   Chest x-ray from 05/17/21 shows persistent left lower lobe atelectasis.   OV 05/17/21 Patient seen by her ENT, [name] with CT scan on 11/17 showing obstructing foreign body in the left mainstem bronchus with total post-obstructive atelectasis of lower lung. Appointment setup for today for further evaluation but she became symptomatic and presented to the ED on 11/26. She underwent bronchoscopy with foreign body retrieval with Dr. Valeta Harms on 11/26. 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 She recovered well post-procedure and was then sent home from the ED.   She was seen yesterday by [name] with reported improvement in her breathing but continuing to have cough with mucous production.   She has been doing well since her hospital visit.  She continues to have  cough with sputum production.  She also complains of significant postnasal drainage, more so than normal.  She denies any issues with aspiration.  She is being scheduled for esophagram and dilation.  Past Medical History:  Diagnosis Date   Anemia    h/o of   Anxiety    Arthritis    in knees   Asthma    Breast cancer ([place])    Bronchitis    COPD (chronic obstructive pulmonary disease) ([place])    Diverticulosis 11/15/2012   noted on screening colonoscopy    Dyspnea    with COPD exerbation   Esophageal stricture    Former smoker 03/19/2011   GERD (gastroesophageal reflux disease)    Heart murmur    asymptomatic    History of laryngectomy    History of radiation therapy 11/15/18- 12/06/18   Right Breast total dose 42.56 Gy in 16 fractions.    Hx of radiation therapy 09/03/10 to 10/16/2010   supraglottic larynx   Hypertension    Hypothyroid    due to radiation   Internal hemorrhoid 11/15/2012   small, noted on screening colonoscopy    Larynx cancer ([place]) 07/31/2010   supraglotttic s/p chemo/radiation and surgical rescection.   Leukocytopenia    Nausea alone 07/28/2013   Neck pain 01/21/2012   Normal MRI 07/14/2011   negative for mestasis    Pneumonia 2012   Sciatica    Seizures ([place])    07/24/11 off Effexor w/o seizure   Sepsis ([place]) 08/04/2012   Sinusitis, chronic 07/20/2011  Bilateral maxillary, identified on MRI of head 07/14/11.     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[place])      Family History  Problem Relation Age of Onset   Heart disease Mother    Heart disease Father    Heart disease Sister    Cancer Brother        type unknown   Liver disease Maternal Grandmother    Cancer Sister        type unknown   Diabetes Sister    Breast cancer Neg Hx      Social History   Socioeconomic History   Marital status: Divorced    Spouse name: Not on file   Number of children: 2   Years of education: 12   Highest education level: Not on file  Occupational History   Occupation: part  time    Comment: warehouse work  Tobacco Use   Smoking status: Former    Packs/day: 2.00    Years: 20.00    Pack years: 40.00    Types: Cigarettes    Quit date: 09/17/2010    Years since quitting: 10.8   Smokeless tobacco: Never  Vaping Use   Vaping Use: Never used  Substance and Sexual Activity   Alcohol use: Yes    Comment: Socially drinks    Drug use: No    Comment: tried cocaine 1 time 2010 only used 1 time   Sexual activity: Yes    Birth control/protection: Surgical  Other Topics Concern   Not on file  Social History Narrative   Lives with boyfriend   Social Determinants of Health   Financial Resource Strain: Not on file  Food Insecurity: Not on file  Transportation Needs: Not on file  Physical Activity: Not on file  Stress: Not on file  Social Connections: Not on file  Intimate Partner Violence: Not on file     Allergies  Allergen Reactions   Lisinopril Cough     Outpatient Medications Prior to Visit  Medication Sig Dispense Refill   albuterol (PROAIR HFA) 108 (90 Base) MCG/ACT inhaler Inhale 2 puffs into the lungs every 4 (four) hours as needed for wheezing or shortness of breath. 18 g 11   albuterol (PROVENTIL) (5 MG/ML) 0.5% nebulizer solution Take 0.5 mLs (2.5 mg total) by nebulization every 6 (six) hours as needed for wheezing or shortness of breath. Please give the patient 10 8ml vials. 200 mL 5   amLODipine (NORVASC) 10 MG tablet Take 1 tablet by mouth daily (Patient taking differently: Take 10 mg by mouth every morning.) 90 tablet 3   anastrozole (ARIMIDEX) 1 MG tablet Take 1 tablet (1 mg total) by mouth daily. 90 tablet 3   aspirin EC 81 MG tablet Take 1 tablet (81 mg total) by mouth 2 (two) times daily. To be taken after surgery (Patient taking differently: Take 81 mg by mouth every morning.) 84 tablet 0   celecoxib (CELEBREX) 200 MG capsule Take 1 capsule (200 mg total) by mouth 2 (two) times daily. 30 capsule 3   cetirizine (ZYRTEC) 10 MG tablet Take  1 tablet (10 mg total) by mouth daily. 90 tablet 1   escitalopram (LEXAPRO) 10 MG tablet Take 1 tablet (10 mg total) by mouth daily. 30 tablet 2   fluticasone (FLONASE) 50 MCG/ACT nasal spray Place 1 spray into both nostrils daily. 16 g 2   Humidifiers MISC 1 application by Tracheal Tube route as needed. 1 each 0   HYDROcodone-acetaminophen (NORCO) 5-325 MG tablet Take 1  tablet by mouth 2 (two) times daily as needed. (Patient taking differently: Take 1 tablet by mouth 2 (two) times daily as needed (pain).) 20 tablet 0   HYDROcodone-acetaminophen (NORCO) 5-325 MG tablet Take 1 tablet by mouth daily as needed. 30 tablet 0   ipratropium (ATROVENT) 0.03 % nasal spray Place 2 sprays into both nostrils every 12 (twelve) hours. 30 mL 12   ipratropium-albuterol (DUONEB) 0.5-2.5 (3) MG/3ML SOLN Take 3 mLs by nebulization every 4 (four) hours as needed. 360 mL 1   levothyroxine (SYNTHROID) 200 MCG tablet Take 1 tablet (200 mcg total) by mouth daily before breakfast. 90 tablet 3   losartan-hydrochlorothiazide (HYZAAR) 100-12.5 MG tablet Take 1 tablet by mouth daily. 90 tablet 1   methocarbamol (ROBAXIN) 500 MG tablet Take 1 tablet (500 mg total) by mouth 2 (two) times daily as needed for muscle spasms. 30 tablet 3   metoprolol tartrate (LOPRESSOR) 50 MG tablet Take 1 tablet (50 mg total) by mouth 2 (two) times daily. 180 tablet 3   mometasone-formoterol (DULERA) 200-5 MCG/ACT AERO Inhale 2 puffs into the lungs 2 (two) times daily. 13 g 5   nystatin cream (MYCOSTATIN) APPLY  CREAM TOPICALLY TWICE DAILY AS NEEDED (Patient taking differently: Apply 1 application topically daily as needed for dry skin.) 30 g 0   omeprazole (PRILOSEC) 40 MG capsule Take 1 capsule by mouth twice daily 90 capsule 0   ondansetron (ZOFRAN) 4 MG tablet Take 1 tablet (4 mg total) by mouth every 8 (eight) hours as needed for nausea or vomiting. 40 tablet 0   Tiotropium Bromide Monohydrate (SPIRIVA RESPIMAT) 2.5 MCG/ACT AERS Inhale 2 puffs  into the lungs daily. 12 g 2   No facility-administered medications prior to visit.   Review of Systems  Constitutional:  Negative for chills, fever, malaise/fatigue and weight loss.  HENT:  Negative for congestion, sinus pain and sore throat.   Eyes: Negative.   Respiratory:  Positive for cough, sputum production and wheezing. Negative for hemoptysis and shortness of breath.   Cardiovascular:  Negative for chest pain, palpitations, orthopnea, claudication and leg swelling.  Gastrointestinal:  Negative for abdominal pain, heartburn, nausea and vomiting.  Genitourinary: Negative.   Musculoskeletal:  Negative for joint pain and myalgias.  Skin:  Negative for rash.  Neurological:  Negative for weakness.  Endo/Heme/Allergies: Negative.   Psychiatric/Behavioral: Negative.     Objective:   Vitals:   07/16/21 1359  BP: 138/84  Pulse: 89  SpO2: 98%  Weight: 241 lb (109.3 kg)  Height: 5\' 7"  (1.702 m)    Physical Exam Constitutional:      General: She is not in acute distress.    Appearance: She is not ill-appearing.  HENT:     Head: Normocephalic and atraumatic.     Mouth/Throat:     Comments: Trachesotomy in place, no erythema or secretions noted Eyes:     General: No scleral icterus.    Conjunctiva/sclera: Conjunctivae normal.  Cardiovascular:     Rate and Rhythm: Normal rate and regular rhythm.     Pulses: Normal pulses.     Heart sounds: Normal heart sounds. No murmur heard. Pulmonary:     Effort: Pulmonary effort is normal.     Breath sounds: Normal breath sounds. No wheezing, rhonchi or rales.  Musculoskeletal:     Right lower leg: No edema.     Left lower leg: No edema.  Skin:    General: Skin is warm and dry.  Neurological:  General: No focal deficit present.     Mental Status: She is alert.  Psychiatric:        Mood and Affect: Mood normal.        Behavior: Behavior normal.        Thought Content: Thought content normal.        Judgment: Judgment normal.     CBC    Component Value Date/Time   WBC 5.8 05/11/2021 0907   RBC 3.99 05/11/2021 0907   HGB 12.0 05/11/2021 0907   HGB 11.7 (L) 06/02/2018 1208   HGB 11.9 08/20/2015 1200   HCT 37.4 05/11/2021 0907   HCT 37.3 08/20/2015 1200   PLT 395 05/11/2021 0907   PLT 359 06/02/2018 1208   PLT 305 08/20/2015 1200   MCV 93.7 05/11/2021 0907   MCV 91.8 08/20/2015 1200   MCH 30.1 05/11/2021 0907   MCHC 32.1 05/11/2021 0907   RDW 13.3 05/11/2021 0907   RDW 14.4 08/20/2015 1200   LYMPHSABS 1.8 05/11/2021 0907   LYMPHSABS 1.2 08/20/2015 1200   MONOABS 0.5 05/11/2021 0907   MONOABS 0.3 08/20/2015 1200   EOSABS 0.4 05/11/2021 0907   EOSABS 0.2 08/20/2015 1200   BASOSABS 0.1 05/11/2021 0907   BASOSABS 0.1 08/20/2015 1200   BMP Latest Ref Rng & Units 06/13/2021 05/11/2021 04/02/2021  Glucose 70 - 99 mg/dL 144(H) 97 120(H)  BUN 8 - 23 mg/dL 17 10 20   Creatinine 0.44 - 1.00 mg/dL 1.25(H) 1.05(H) 0.96  BUN/Creat Ratio 12 - 28 - - -  Sodium 135 - 145 mmol/L 134(L) 137 134(L)  Potassium 3.5 - 5.1 mmol/L 3.4(L) 3.0(L) 4.5  Chloride 98 - 111 mmol/L 101 99 102  CO2 22 - 32 mmol/L 23 22 24   Calcium 8.9 - 10.3 mg/dL 9.2 8.7(L) 8.9   Chest imaging: CXR 05/17/21 Persistent complete atelectasis of the left lower lobe. Lung volumes are normal. No consolidative airspace disease. No pleural effusions. No pneumothorax. No pulmonary nodule or mass noted. Pulmonary vasculature and the cardiomediastinal silhouette are within normal limits.  CT Chest 05/02/21 1. There is an obstructing foreign body at the origin of the left mainstem bronchus, with total postobstructive atelectasis of the left lower lobe. This may be an aspirated tracheoesophageal speech valve. 2. Tracheostomy. 3. Hepatic steatosis.  PFT: No flowsheet data found.  Labs:  Path:  Echo 08/31/17: LVEF 65 to 70%.  Grade 2 diastolic dysfunction.  Normal size and function.  Heart Catheterization:  Assessment & Plan:   Moderate  persistent asthma without complication - Plan: sodium chloride HYPERTONIC 3 % nebulizer solution  Foreign body in main bronchus, subsequent encounter - Plan: CT Chest Wo Contrast  Post-nasal drainage  Discussion: Deborah [name] is a 66 year old woman, former smoker with laryngeal cancer s/p laryngectomy who returns to pulmonary clinic for hospital follow up after retrieval of foreign body from her left main-stem bronchus.   She continues to have left lower lobe atelectasis based on chest x-ray on 05/17/21 after her procedure removing the forieng body from the left main stem bronchus. She reports increasing coughing fits over recent weeks that awaken her at night. She has difficulty clearing chest congestion despite ICS/LABA/LAMA inhaler therapy and duoneb nebulizer treatments.   We will start her on hypertonic saline nebs for airway clearance. She is unable to perform flutter valve therapy.   We will check CT chest for further evaluation of the left bronchial tree if she has persistent obstruction of left lower lobe.   She  is to continue Dulera and Spiriva inhaler therapy for her asthma.   She is to continue fluticasone nasal spray daily and ipratropium nasal spray twice daily for her postnasal drainage.  She is to continue Zyrtec daily for allergies.  Follow-up in 3 months.  Freda Jackson, MD [facility] Office: [phone]    Current Outpatient Medications:    albuterol (PROAIR HFA) 108 (90 Base) MCG/ACT inhaler, Inhale 2 puffs into the lungs every 4 (four) hours as needed for wheezing or shortness of breath., Disp: 18 g, Rfl: 11   albuterol (PROVENTIL) (5 MG/ML) 0.5% nebulizer solution, Take 0.5 mLs (2.5 mg total) by nebulization every 6 (six) hours as needed for wheezing or shortness of breath. Please give the patient 10 2ml vials., Disp: 200 mL, Rfl: 5   amLODipine (NORVASC) 10 MG tablet, Take 1 tablet by mouth daily (Patient taking differently: Take 10  mg by mouth every morning.), Disp: 90 tablet, Rfl: 3   anastrozole (ARIMIDEX) 1 MG tablet, Take 1 tablet (1 mg total) by mouth daily., Disp: 90 tablet, Rfl: 3   aspirin EC 81 MG tablet, Take 1 tablet (81 mg total) by mouth 2 (two) times daily. To be taken after surgery (Patient taking differently: Take 81 mg by mouth every morning.), Disp: 84 tablet, Rfl: 0   celecoxib (CELEBREX) 200 MG capsule, Take 1 capsule (200 mg total) by mouth 2 (two) times daily., Disp: 30 capsule, Rfl: 3   cetirizine (ZYRTEC) 10 MG tablet, Take 1 tablet (10 mg total) by mouth daily., Disp: 90 tablet, Rfl: 1   escitalopram (LEXAPRO) 10 MG tablet, Take 1 tablet (10 mg total) by mouth daily., Disp: 30 tablet, Rfl: 2   fluticasone (FLONASE) 50 MCG/ACT nasal spray, Place 1 spray into both nostrils daily., Disp: 16 g, Rfl: 2   Humidifiers MISC, 1 application by Tracheal Tube route as needed., Disp: 1 each, Rfl: 0   HYDROcodone-acetaminophen (NORCO) 5-325 MG tablet, Take 1 tablet by mouth 2 (two) times daily as needed. (Patient taking differently: Take 1 tablet by mouth 2 (two) times daily as needed (pain).), Disp: 20 tablet, Rfl: 0   HYDROcodone-acetaminophen (NORCO) 5-325 MG tablet, Take 1 tablet by mouth daily as needed., Disp: 30 tablet, Rfl: 0   ipratropium (ATROVENT) 0.03 % nasal spray, Place 2 sprays into both nostrils every 12 (twelve) hours., Disp: 30 mL, Rfl: 12   ipratropium-albuterol (DUONEB) 0.5-2.5 (3) MG/3ML SOLN, Take 3 mLs by nebulization every 4 (four) hours as needed., Disp: 360 mL, Rfl: 1   levothyroxine (SYNTHROID) 200 MCG tablet, Take 1 tablet (200 mcg total) by mouth daily before breakfast., Disp: 90 tablet, Rfl: 3   losartan-hydrochlorothiazide (HYZAAR) 100-12.5 MG tablet, Take 1 tablet by mouth daily., Disp: 90 tablet, Rfl: 1   methocarbamol (ROBAXIN) 500 MG tablet, Take 1 tablet (500 mg total) by mouth 2 (two) times daily as needed for muscle spasms., Disp: 30 tablet, Rfl: 3   metoprolol tartrate  (LOPRESSOR) 50 MG tablet, Take 1 tablet (50 mg total) by mouth 2 (two) times daily., Disp: 180 tablet, Rfl: 3   mometasone-formoterol (DULERA) 200-5 MCG/ACT AERO, Inhale 2 puffs into the lungs 2 (two) times daily., Disp: 13 g, Rfl: 5   nystatin cream (MYCOSTATIN), APPLY  CREAM TOPICALLY TWICE DAILY AS NEEDED (Patient taking differently: Apply 1 application topically daily as needed for dry skin.), Disp: 30 g, Rfl: 0   omeprazole (PRILOSEC) 40 MG capsule, Take 1 capsule by mouth twice daily, Disp: 90 capsule,  Rfl: 0   ondansetron (ZOFRAN) 4 MG tablet, Take 1 tablet (4 mg total) by mouth every 8 (eight) hours as needed for nausea or vomiting., Disp: 40 tablet, Rfl: 0   sodium chloride HYPERTONIC 3 % nebulizer solution, Take by nebulization as needed for other., Disp: 750 mL, Rfl: 12   Tiotropium Bromide Monohydrate (SPIRIVA RESPIMAT) 2.5 MCG/ACT AERS, Inhale 2 puffs into the lungs daily., Disp: 12 g, Rfl: 2

## 2021-07-16 NOTE — Patient Instructions (Addendum)
Use hypertonic saline nebs 2-3 times per day for mucous clearance.  Continue fluticasone nasal spray, 1 spray per nostril daily   Continue ipratropium nasal spray 2 sprays per nostril, twice daily   Continue on dulera and spiriva inhaler   Continue Zyrtec Daily for allergies.   We will schedule you for CT Chest scan in the near future to evaluate the left lower lung  Continue duoneb nebulizer treatments as needed

## 2021-07-17 ENCOUNTER — Ambulatory Visit: Payer: Medicaid Other | Admitting: Family Medicine

## 2021-07-17 ENCOUNTER — Other Ambulatory Visit: Payer: Self-pay

## 2021-07-17 ENCOUNTER — Encounter: Payer: Self-pay | Admitting: Family Medicine

## 2021-07-17 VITALS — BP 168/86 | HR 92 | Wt 244.2 lb

## 2021-07-17 DIAGNOSIS — I10 Essential (primary) hypertension: Secondary | ICD-10-CM

## 2021-07-17 DIAGNOSIS — L304 Erythema intertrigo: Secondary | ICD-10-CM

## 2021-07-17 MED ORDER — NYSTATIN 100000 UNIT/GM EX CREA: 1.0000 "application " | TOPICAL_CREAM | Freq: Every day | CUTANEOUS | 1 refills | Status: DC | PRN

## 2021-07-17 NOTE — Progress Notes (Signed)
° ° °  SUBJECTIVE:   CHIEF COMPLAINT / HPI:   Deborah [name] is a 66 yo F who presents for medication refill.  Desires refill on nystatin cream.  States she has a history of intertrigo under the breast fold bilaterally.  States it is not helping but now but she uses it on a regular basis when it does flareup.  Nystatin cream resolves this issue.  Hypertension: - Medications: Amlodipine 10 mg daily, Hyzaar 100-12.5 mg daily - Compliance: yes - Checking BP at home: yes, states this is how it always is.  - Denies any SOB, CP, vision changes, LE edema, medication SEs, or symptoms of hypotension  PERTINENT  PMH / PSH: Chronic diastolic heart failure, throat cancer, COPD  OBJECTIVE:   BP (!) 168/86    Pulse 92    Wt 244 lb 3.2 oz (110.8 kg)    SpO2 98%    BMI 38.25 kg/m   General: Appears well, no acute distress. Age appropriate. Cardiac: RRR, normal heart sounds, no murmurs Respiratory: Difficult lung exam noisy upper airway breathing, normal effort Extremities: No edema or cyanosis. Skin: Bilateral skin fold intertrigo Neuro: alert and oriented, no focal deficits Psych: normal affect   ASSESSMENT/PLAN:   1. Intertrigo Skin does not appear to be inflamed or infected.  Refill cream. - nystatin cream (MYCOSTATIN); Apply 1 application topically daily as needed for dry skin.  Dispense: 30 g; Refill: 1  2. Essential hypertension Elevated.  Asymptomatic.  Discussed with patient to follow-up with PCP Dr. Nancy Fetter for BP recheck and medication adjustments in 2 weeks.  Gerlene Fee, [place]

## 2021-07-17 NOTE — Patient Instructions (Addendum)
Thank you for coming in today.  I have refilled your nystatin cream.  Continue to follow-up with your primary care doctor in 2 weeks for a blood pressure recheck. I would suggest medication adjustment  Continue to follow up with your specialist for ongoing treatment.  Dr. Janus Molder

## 2021-07-18 DIAGNOSIS — C329 Malignant neoplasm of larynx, unspecified: Secondary | ICD-10-CM | POA: Diagnosis not present

## 2021-07-18 DIAGNOSIS — Z930 Tracheostomy status: Secondary | ICD-10-CM | POA: Diagnosis not present

## 2021-07-18 DIAGNOSIS — C321 Malignant neoplasm of supraglottis: Secondary | ICD-10-CM | POA: Diagnosis not present

## 2021-07-23 ENCOUNTER — Telehealth: Payer: Self-pay | Admitting: Pulmonary Disease

## 2021-07-23 DIAGNOSIS — J4540 Moderate persistent asthma, uncomplicated: Secondary | ICD-10-CM

## 2021-07-23 MED ORDER — SODIUM CHLORIDE 3 % IN NEBU: INHALATION_SOLUTION | RESPIRATORY_TRACT | 12 refills | Status: DC | PRN

## 2021-07-23 NOTE — Telephone Encounter (Signed)
Ok to send in new order for nebulizer machine and supplies. Can you send in hypertonic saline neb order to [facility] as well. Prescription was previously send to [facility] but they do not have any.  Thanks, JD

## 2021-07-23 NOTE — Telephone Encounter (Signed)
Called and spoke with patient. She stated that she was able to get the sodium chloride yesterday but it cost $75. She would like to have the RX sent to [facility] to see if it will be cheaper.   She is also aware that I will place an order for her to get a new nebulizer and supplies from [facility] as well.   Orders have been placed.   Nothing further needed at time of call.

## 2021-07-23 NOTE — Telephone Encounter (Signed)
Spoke to patient. She is requesting an order for nebulizer machine to be sent to [facility].  Not originally ordered by our office.  Dr. Erin Fulling, please advise. Thanks

## 2021-07-25 DIAGNOSIS — C329 Malignant neoplasm of larynx, unspecified: Secondary | ICD-10-CM | POA: Diagnosis not present

## 2021-07-25 DIAGNOSIS — M1712 Unilateral primary osteoarthritis, left knee: Secondary | ICD-10-CM | POA: Diagnosis not present

## 2021-07-25 DIAGNOSIS — Z96652 Presence of left artificial knee joint: Secondary | ICD-10-CM | POA: Diagnosis not present

## 2021-07-25 DIAGNOSIS — C321 Malignant neoplasm of supraglottis: Secondary | ICD-10-CM | POA: Diagnosis not present

## 2021-07-25 DIAGNOSIS — Z930 Tracheostomy status: Secondary | ICD-10-CM | POA: Diagnosis not present

## 2021-08-08 ENCOUNTER — Other Ambulatory Visit: Payer: Self-pay

## 2021-08-08 ENCOUNTER — Ambulatory Visit
Admission: RE | Admit: 2021-08-08 | Discharge: 2021-08-08 | Disposition: A | Discharge status: Home / Self Care | Payer: Medicaid Other | Source: Ambulatory Visit | Attending: Pulmonary Disease | Admitting: Pulmonary Disease

## 2021-08-08 DIAGNOSIS — I3139 Other pericardial effusion (noninflammatory): Secondary | ICD-10-CM | POA: Diagnosis not present

## 2021-08-08 DIAGNOSIS — T17508D Unspecified foreign body in bronchus causing other injury, subsequent encounter: Secondary | ICD-10-CM

## 2021-08-09 ENCOUNTER — Other Ambulatory Visit: Payer: Medicaid Other

## 2021-08-21 ENCOUNTER — Telehealth: Payer: Self-pay | Admitting: Pulmonary Disease

## 2021-08-21 NOTE — Telephone Encounter (Signed)
Please advise if you have the CMN?  ?

## 2021-08-21 NOTE — Telephone Encounter (Signed)
I have this CMN but Dr Erin Fulling is not in the office till 03/13 I will have him sign then ?

## 2021-08-29 ENCOUNTER — Emergency Department (HOSPITAL_COMMUNITY): Payer: Medicaid Other

## 2021-08-29 ENCOUNTER — Emergency Department (HOSPITAL_COMMUNITY)
Admission: EM | Admit: 2021-08-29 | Discharge: 2021-08-29 | Disposition: A | Discharge status: Home / Self Care | Payer: Medicaid Other | Attending: Emergency Medicine | Admitting: Emergency Medicine

## 2021-08-29 ENCOUNTER — Other Ambulatory Visit: Payer: Self-pay

## 2021-08-29 DIAGNOSIS — R0789 Other chest pain: Secondary | ICD-10-CM | POA: Insufficient documentation

## 2021-08-29 DIAGNOSIS — I10 Essential (primary) hypertension: Secondary | ICD-10-CM | POA: Diagnosis not present

## 2021-08-29 DIAGNOSIS — Z853 Personal history of malignant neoplasm of breast: Secondary | ICD-10-CM | POA: Insufficient documentation

## 2021-08-29 DIAGNOSIS — R918 Other nonspecific abnormal finding of lung field: Secondary | ICD-10-CM | POA: Diagnosis not present

## 2021-08-29 DIAGNOSIS — Z7982 Long term (current) use of aspirin: Secondary | ICD-10-CM | POA: Diagnosis not present

## 2021-08-29 DIAGNOSIS — J9811 Atelectasis: Secondary | ICD-10-CM | POA: Insufficient documentation

## 2021-08-29 DIAGNOSIS — Z79899 Other long term (current) drug therapy: Secondary | ICD-10-CM | POA: Insufficient documentation

## 2021-08-29 DIAGNOSIS — Z8521 Personal history of malignant neoplasm of larynx: Secondary | ICD-10-CM | POA: Diagnosis not present

## 2021-08-29 DIAGNOSIS — R071 Chest pain on breathing: Secondary | ICD-10-CM | POA: Diagnosis present

## 2021-08-29 DIAGNOSIS — J9809 Other diseases of bronchus, not elsewhere classified: Secondary | ICD-10-CM | POA: Diagnosis not present

## 2021-08-29 DIAGNOSIS — R590 Localized enlarged lymph nodes: Secondary | ICD-10-CM | POA: Diagnosis not present

## 2021-08-29 LAB — CBC WITH DIFFERENTIAL/PLATELET
Abs Immature Granulocytes: 0.02 10*3/uL (ref 0.00–0.07)
Basophils Absolute: 0.1 10*3/uL (ref 0.0–0.1)
Basophils Relative: 1 %
Eosinophils Absolute: 0.2 10*3/uL (ref 0.0–0.5)
Eosinophils Relative: 3 %
HCT: 38.1 % (ref 36.0–46.0)
Hemoglobin: 12.3 g/dL (ref 12.0–15.0)
Immature Granulocytes: 0 %
Lymphocytes Relative: 25 %
Lymphs Abs: 1.3 10*3/uL (ref 0.7–4.0)
MCH: 30.1 pg (ref 26.0–34.0)
MCHC: 32.3 g/dL (ref 30.0–36.0)
MCV: 93.4 fL (ref 80.0–100.0)
Monocytes Absolute: 0.4 10*3/uL (ref 0.1–1.0)
Monocytes Relative: 8 %
Neutro Abs: 3.3 10*3/uL (ref 1.7–7.7)
Neutrophils Relative %: 63 %
Platelets: 318 10*3/uL (ref 150–400)
RBC: 4.08 MIL/uL (ref 3.87–5.11)
RDW: 13.8 % (ref 11.5–15.5)
WBC: 5.2 10*3/uL (ref 4.0–10.5)
nRBC: 0 % (ref 0.0–0.2)

## 2021-08-29 LAB — BASIC METABOLIC PANEL
Anion gap: 13 (ref 5–15)
BUN: 13 mg/dL (ref 8–23)
CO2: 26 mmol/L (ref 22–32)
Calcium: 8.9 mg/dL (ref 8.9–10.3)
Chloride: 99 mmol/L (ref 98–111)
Creatinine, Ser: 0.93 mg/dL (ref 0.44–1.00)
GFR, Estimated: >60 mL/min (ref >60)
Glucose, Bld: 147 mg/dL — ABNORMAL HIGH (ref 70–99)
Potassium: 3.6 mmol/L (ref 3.5–5.1)
Sodium: 138 mmol/L (ref 135–145)

## 2021-08-29 MED ORDER — KETOROLAC TROMETHAMINE 30 MG/ML IJ SOLN
15.0000 mg | Freq: Once | INTRAMUSCULAR | Status: AC
  Administered 2021-08-29: 15 mg via INTRAVENOUS
  Filled 2021-08-29: qty 1

## 2021-08-29 MED ORDER — IOHEXOL 350 MG/ML SOLN
61.0000 mL | Freq: Once | INTRAVENOUS | Status: AC | PRN
  Administered 2021-08-29: 61 mL via INTRAVENOUS

## 2021-08-29 NOTE — ED Triage Notes (Signed)
Pt arrives to ED POV c/o right side body pain.. Pt states it sharp pain 10/10 that has been going on for a couple of weeks. Pt has a tracheostomy. Hx HTN. No Distress noted at this time. ?

## 2021-08-29 NOTE — ED Provider Notes (Signed)
?Deborah [name] ?Provider Note ? ? ?CSN: [id number] ?Arrival date & time: 08/29/21  0342 ? ?  ? ?History ? ?Chief Complaint  ?Patient presents with  ? Flank Pain  ? ? ?Deborah [name] is a 66 y.o. female. ? ?The history is provided by the patient.  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  She states that it has gotten worse over the last 5 days.  Pain is worse with movement and worse with taking a deep breath.  She denies dyspnea.  She has a chronic cough which is unchanged.  She denies fever, chills, sweats.  There has been some mild nausea but no vomiting and no diarrhea.  She has been taking acetaminophen without relief. ?  ?Home Medications ?Prior to Admission medications   ?Medication Sig Start Date End Date Taking? Authorizing Provider  ?albuterol (PROAIR HFA) 108 (90 Base) MCG/ACT inhaler Inhale 2 puffs into the lungs every 4 (four) hours as needed for wheezing or shortness of breath. 12/11/20   Benay Pike, MD  ?albuterol (PROVENTIL) (5 MG/ML) 0.5% nebulizer solution Take 0.5 mLs (2.5 mg total) by nebulization every 6 (six) hours as needed for wheezing or shortness of breath. Please give the patient 10 36m vials. 12/11/20   OBenay Pike MD  ?amLODipine (NORVASC) 10 MG tablet Take 1 tablet by mouth daily ?Patient taking differently: Take 10 mg by mouth every morning. 12/11/20   OBenay Pike MD  ?anastrozole (ARIMIDEX) 1 MG tablet Take 1 tablet (1 mg total) by mouth daily. 02/15/21   GNicholas Lose MD  ?aspirin EC 81 MG tablet Take 1 tablet (81 mg total) by mouth 2 (two) times daily. To be taken after surgery ?Patient taking differently: Take 81 mg by mouth every morning. 01/21/21   SAundra Dubin PA-C  ?celecoxib (CELEBREX) 200 MG capsule Take 1 capsule (200 mg total) by mouth 2 (two) times  daily. 04/17/21   XLeandrew Koyanagi MD  ?cetirizine (ZYRTEC) 10 MG tablet Take 1 tablet (10 mg total) by mouth daily. 12/20/20   SZola Button MD  ?escitalopram (LEXAPRO) 10 MG tablet Take 1 tablet (10 mg total) by mouth daily. 12/25/20   SZola Button MD  ?fluticasone (Asencion Islam 50 MCG/ACT nasal spray Place 1 spray into both nostrils daily. 05/17/21   DFreddi Starr MD  ?Humidifiers MISC 1 application by Tracheal Tube route as needed. 02/05/21   SZola Button MD  ?HYDROcodone-acetaminophen (NORCO) 5-325 MG tablet Take 1 tablet by mouth 2 (two) times daily as needed. ?Patient taking differently: Take 1 tablet by mouth 2 (two) times daily as needed (pain). 04/22/21   SAundra Dubin PA-C  ?HYDROcodone-acetaminophen (NORCO) 5-325 MG tablet Take 1 tablet by mouth daily as needed. 06/25/21   XLeandrew Koyanagi MD  ?ipratropium (ATROVENT) 0.03 % nasal spray Place 2 sprays into both nostrils every 12 (twelve) hours. 05/17/21   DFreddi Starr MD  ?ipratropium-albuterol (DUONEB) 0.5-2.5 (3) MG/3ML SOLN Take 3 mLs by nebulization every 4 (four) hours as needed. 07/19/20   BMartyn Malay MD  ?levothyroxine (SYNTHROID) 200 MCG tablet Take 1 tablet (200 mcg total) by mouth daily before breakfast. 12/18/20   HZenia Resides MD  ?losartan-hydrochlorothiazide (HYZAAR) 100-12.5 MG tablet Take 1 tablet by mouth daily. 12/25/20   SZola Button MD  ?methocarbamol (ROBAXIN) 500  MG tablet Take 1 tablet (500 mg total) by mouth 2 (two) times daily as needed for muscle spasms. 06/25/21   Leandrew Koyanagi, MD  ?metoprolol tartrate (LOPRESSOR) 50 MG tablet Take 1 tablet (50 mg total) by mouth 2 (two) times daily. 12/20/20   Zola Button, MD  ?mometasone-formoterol [facility]) 200-5 MCG/ACT AERO Inhale 2 puffs into the lungs 2 (two) times daily. 12/11/20   Benay Pike, MD  ?nystatin cream (MYCOSTATIN) Apply 1 application topically daily as needed for dry skin. 07/17/21   Autry-Lott, Naaman Plummer, DO  ?omeprazole (PRILOSEC) 40 MG capsule Take 1 capsule by mouth  twice daily 07/05/21   Zola Button, MD  ?ondansetron (ZOFRAN) 4 MG tablet Take 1 tablet (4 mg total) by mouth every 8 (eight) hours as needed for nausea or vomiting. 01/21/21   Aundra Dubin, PA-C  ?sodium chloride HYPERTONIC 3 % nebulizer solution Take by nebulization as needed for other. 07/23/21   Freddi Starr, MD  ?Tiotropium Bromide Monohydrate (SPIRIVA RESPIMAT) 2.5 MCG/ACT AERS Inhale 2 puffs into the lungs daily. 04/16/21   Zola Button, MD  ?   ? ?Allergies    ?Lisinopril   ? ?Review of Systems   ?Review of Systems  ?Genitourinary:  Positive for flank pain.  ?All other systems reviewed and are negative. ? ?Physical Exam ?Updated Vital Signs ?BP (!) 169/103   Pulse 80   Temp 98.6 ?F (37 ?C)   Resp 20   Ht '5\' 7"'$  (1.702 m)   Wt 108 kg   SpO2 94%   BMI 37.28 kg/m?  ?Physical Exam ?Vitals and nursing note reviewed.  ?66 year old female, resting comfortably and in no acute distress. Vital signs are significant for elevated blood pressure. Oxygen saturation is 93%, which is normal. ?Head is normocephalic and atraumatic. PERRLA, EOMI. Oropharynx is clear. ?Neck is nontender and supple without adenopathy or JVD.  Tracheostomy is in place. ?Back is nontender and there is no CVA tenderness. ?Lungs are clear without rales, wheezes, or rhonchi. ?Chest is mildly tender in the right lateral and posterior chest wall.  There is no crepitus. ?Heart has regular rate and rhythm without murmur. ?Abdomen is soft, flat, nontender. ?Extremities have no cyanosis or edema, full range of motion is present. ?Skin is warm and dry without rash. ?Neurologic: Mental status is normal, cranial nerves are intact, moves all extremities equally. ? ?ED Results / Procedures / Treatments   ?Labs ?(all labs ordered are listed, but only abnormal results are displayed) ?Labs Reviewed  ?BASIC METABOLIC PANEL - Abnormal; Notable for the following components:  ?    Result Value  ? Glucose, Bld 147 (*)   ? All other components within normal  limits  ?CBC WITH DIFFERENTIAL/PLATELET  ? ?Radiology ?CT Angio Chest PE W and/or Wo Contrast ? ?Result Date: 08/29/2021 ?CLINICAL DATA:  Evaluate for acute pulmonary embolus. EXAM: CT ANGIOGRAPHY CHEST WITH CONTRAST TECHNIQUE: Multidetector CT imaging of the chest was performed using the standard protocol during bolus administration of intravenous contrast. Multiplanar CT image reconstructions and MIPs were obtained to evaluate the vascular anatomy. RADIATION DOSE REDUCTION: This exam was performed according to the departmental dose-optimization program which includes automated exposure control, adjustment of the mA and/or kV according to patient size and/or use of iterative reconstruction technique. CONTRAST:  70m OMNIPAQUE IOHEXOL 350 MG/ML SOLN COMPARISON:  None. FINDINGS: Cardiovascular: Satisfactory opacification of the pulmonary arteries to the segmental level. No evidence of pulmonary embolism. Normal heart size. No pericardial effusion. Mediastinum/Nodes: The  patient is status post tracheostomy. The esophagus is unremarkable. No enlarged axillary left axillary lymph node is unchanged from the previous exam measuring 1.2 cm, image 31/5. No right axillary, supraclavicular, mediastinal, or hilar adenopathy. Lungs/Pleura: There is a diffuse mosaic attenuation pattern throughout both lungs which likely reflects air trapping secondary to small airways disease. Again noted is complete atelectasis of the right lower lobe with hyperexpansion of the left upper lobe. There is an abrupt cut off of the left lower lobe bronchus centrally. This appears unchanged from the previous exam. No radiopaque foreign body identified. Upper Abdomen: Diffuse hepatic steatosis. Contour the liver appears slightly irregular. No acute findings noted within the imaged portions of the upper abdomen. Musculoskeletal: No chest wall abnormality. No acute or significant osseous findings. Review of the MIP images confirms the above findings.  IMPRESSION: 1. No evidence for acute pulmonary embolism. 2. Persistent complete atelectasis of the right lower lobe with hyperexpansion of the left upper lobe. Central occlusion of the left lower lobe bronchus is

## 2021-08-29 NOTE — ED Notes (Signed)
Patient transported to CT 

## 2021-08-29 NOTE — Telephone Encounter (Signed)
Per excel spreadsheet, this was completed on 08/27/21.   ?

## 2021-08-29 NOTE — Discharge Instructions (Signed)
Your CT scan did not show any signs of blood clots or pneumonia.  However, there is concern that there is a part of your lung that has not fully expanded and there is still blockage of one of the bronchi (tubes to take air into the lungs).  Please make sure to follow-up with the lung specialist.  They may need to do a repeat bronchoscopy. ? ?For your pain, take ibuprofen and/or acetaminophen as needed.  Please note that if you combine ibuprofen and acetaminophen, you will get better pain relief than you get from either medication by itself. ? ?Return to the emergency department if your symptoms are worsening. ?

## 2021-08-30 ENCOUNTER — Telehealth: Payer: Self-pay

## 2021-08-30 NOTE — Telephone Encounter (Signed)
Transition Care Management Unsuccessful Follow-up Telephone Call ? ?Date of discharge and from where:  08/29/2021 from [facility] ? ?Attempts:  1st Attempt ? ?Reason for unsuccessful TCM follow-up call:  Unable to leave message ? ? ? ?

## 2021-09-01 NOTE — Progress Notes (Signed)
? ? ? ?  SUBJECTIVE:  ? ?CHIEF COMPLAINT / HPI:  ? ?Dyna Figuereo is a 66 y.o. female presents for HTN follow up  ? ?Hypertension ?Patient's current antihypertensive  medications include: amlodipine, losartan and metoprolol. She is unsure why she was taken off the combo losartan pill. Compliant with medications and tolerating well without side effects.  Checking BP at home with readings between 694 and 854 systolic. Mostly in the 180s. In the ED her recent BP was 169/103 and they recommended f/u with PCP. Denies any headaches, SOB, CP, vision changes, LE edema, medication SEs, or symptoms of hypotension.  ? ?Most recent creatinine trend:  ?Lab Results  ?Component Value Date  ? CREATININE 0.93 08/29/2021  ? CREATININE 1.25 (H) 06/13/2021  ? CREATININE 1.05 (H) 05/11/2021  ?  ? ?Patient has had a BMP in the past 1 year. ? ?Pt would like to know results from imaging in ED. Explained results from recent CT scan results. Pt has upcoming appointment with pulmonology. ? ?[facility] Office Visit from 09/02/2021 in [place]  ?PHQ-9 Total Score 4  ? ?  ?  ? 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 ?OBJECTIVE:  ? ?BP (!) 180/100   Pulse 80   Ht '5\' 7"'$  (1.702 m)   Wt 248 lb 9.6 oz (112.8 kg)   SpO2 98%   BMI 38.94 kg/m?   ? ?Repeat BP 170/90 ? ?General: Alert, no acute distress ?Cardio: well perfused  ?Pulm: CTAB, normal work of breathing ?Neuro: Cranial nerves grossly intact  ? ?ASSESSMENT/PLAN:  ? ?Essential hypertension ?Bp elevated to 180/100. Repeat 170/90 . Recommended pt restarts Losartan-HCZT combo. Continue amlodipine and lopressor. Recommended she keeps BP diary twice a day and brings it with her to next visit. Follow up in 2 weeks with me. Strict ER precautions given to pt. ?  ? ?Lattie Haw, MD PGY-3 ?[place]  ?

## 2021-09-02 ENCOUNTER — Ambulatory Visit (INDEPENDENT_AMBULATORY_CARE_PROVIDER_SITE_OTHER): Payer: Medicaid Other | Admitting: Family Medicine

## 2021-09-02 ENCOUNTER — Other Ambulatory Visit: Payer: Self-pay | Admitting: Pulmonary Disease

## 2021-09-02 ENCOUNTER — Other Ambulatory Visit: Payer: Self-pay

## 2021-09-02 DIAGNOSIS — R0982 Postnasal drip: Secondary | ICD-10-CM

## 2021-09-02 DIAGNOSIS — I10 Essential (primary) hypertension: Secondary | ICD-10-CM

## 2021-09-02 MED ORDER — LOSARTAN POTASSIUM-HCTZ 100-12.5 MG PO TABS: 1.0000 | ORAL_TABLET | Freq: Every day | ORAL | 1 refills | Status: DC

## 2021-09-02 NOTE — Assessment & Plan Note (Addendum)
Bp elevated to 180/100. Repeat 170/90 . Recommended pt restarts Losartan-HCZT combo. Continue amlodipine and lopressor. Recommended she keeps BP diary twice a day and brings it with her to next visit. Follow up in 2 weeks with me. Strict ER precautions given to pt. ?

## 2021-09-02 NOTE — Patient Instructions (Signed)
Thank you for coming to see me today. It was a pleasure. Today we discussed your elevated blood pressure, your readings are too high, your BP should be 140/90. I recommend restarting losartan-hctz medication. Stop losartan. ? ?Come back in 2 weeks for a follow up. Keep BP diary at home morning and evening. If you have new headaches, vision changes, chest pain, etc then please go to the ER. ? ?Please follow up with pulmonology for your lungs. ? ?If you have any questions or concerns, please do not hesitate to call the office at [phone]. ? ?Best wishes,  ? ?Dr Posey Pronto   ?

## 2021-09-04 NOTE — Telephone Encounter (Signed)
Transition Care Management Unsuccessful Follow-up Telephone Call ? ?Date of discharge and from where:  08/29/2021 from [facility] ? ?Attempts:  2nd Attempt ? ?Reason for unsuccessful TCM follow-up call:  Unable to reach patient ? ?Patient had appt with PCP.  ? ? ? ?

## 2021-09-16 NOTE — Progress Notes (Signed)
? ? ? ?SUBJECTIVE:  ? ?CHIEF COMPLAINT / HPI:  ? ?Deborah [name] is a 66 y.o. female presents for HTN follow up ? ?Hypertension ?Patient's current antihypertensive  medications include: Amlodipine and Metoprolol. BP on previous visit was 180/100 and 170/90. Started pt on losartan HCTZ combo pill. Pt stopped taking her amlodipine as she thought that is what she was supposed to do.  Checking BP at home with readings between 129/76 and 168/87. Most of them are above 470J systolic. Denies any SOB, CP, vision changes, LE edema, medication SEs, or symptoms of hypotension.  ? ?Most recent creatinine trend:  ?Lab Results  ?Component Value Date  ? CREATININE 0.93 08/29/2021  ? CREATININE 1.25 (H) 06/13/2021  ? CREATININE 1.05 (H) 05/11/2021  ?  ? ?Patient has had a BMP in the past 1 year. ? ? ?Diarrhea ?Pt reports acute diarrheal illness over the last 1 week. Her stools are watery and she is having 3 Bms a day. She also endorses mild nausea and mild lower abdominal cramping. She is able to tolerate PO and stay hydrated. She did have 1 epsiode of dark stools which then resolved. Last BM was yesterday. She has been taking an anti-diarrheal medicine for her symptoms which has helped. Denies weight loss, fevers, night sweats, rectal bleeding. No recent abx use, recent travel, take out, sick contacts etc. Pt is not taking iron or bismuth. Last colonoscopy was in 2014 which showed diverticulosis and internal hemorrhoids. No hx of GI malignancy. ? ? ?[facility] Office Visit from 09/17/2021 in [place]  ?PHQ-9 Total Score 3  ? ?  ?  ? ?Health Maintenance Due  ?Topic  ? Zoster Vaccines- Shingrix (1 of 2)  ? ?  ?PERTINENT  PMH / PSH: HTN, depression  ? ?OBJECTIVE:  ? ?BP 136/88   Pulse 76   Ht '5\' 7"'$  (1.702 m)   Wt 245 lb 6.4 oz (111.3 kg)   SpO2 94%   BMI 38.44 kg/m?   ? ?General: Alert, no acute distress ?Cardio: well perfused  ?Pulm: normal work of breathing ?Abdomen: Bowel sounds normal.  Abdomen soft, mild TTP lower abdomen, no guarding ?Neuro: Cranial nerves grossly intact  ? ?ASSESSMENT/PLAN:  ? ?Essential hypertension ?BP 136/88 today, however most of pt's home readings are consistently still elevated to above 628Z systolic. She stopped her amlodipine because she got confused and thought she had to stop it after she started the losartan-hctz. Recommended that she continues losartan-hctz, lopressor and amlodipine and continues to keep BP diary at home twice a day. F/u in 2 weeks. Strict ER precautions given to pt. ? ?Diarrhea ?Pt reports acute diarrheal illness over the last 1 week.  No recent travel, recent abx use or sick contacts. No recent take outs. Unclear etiology but it is likely infectious given that it is watery and now improving. It is reassuring that she is well hydrated and able to tolerate PO with no emesis. Pt does report one episode of black stools but no rectal bleeding which has now resolved, no further episodes. She is not on iron supplements or bismuth. Last colonoscopy was in 2014 and she is next due next year. Recent CBC 2 weeks was wnl. Normal vital signs today. Recommended she keeps an eye on her symptoms, if they do not improve or she has any further melena/rectal bleeding she should come back to the clinic and we will refer her to GI for a colonoscopy. Strict ER precautions given to pt. ?  ? ?  Lattie Haw, MD PGY-3 ?[place]  ?

## 2021-09-17 ENCOUNTER — Ambulatory Visit: Payer: Medicaid Other | Admitting: Family Medicine

## 2021-09-17 ENCOUNTER — Ambulatory Visit: Payer: Medicaid Other | Admitting: Pulmonary Disease

## 2021-09-17 ENCOUNTER — Encounter: Payer: Self-pay | Admitting: Family Medicine

## 2021-09-17 ENCOUNTER — Encounter: Payer: Self-pay | Admitting: Pulmonary Disease

## 2021-09-17 VITALS — BP 136/88 | HR 76 | Ht 67.0 in | Wt 245.4 lb

## 2021-09-17 VITALS — BP 128/80 | HR 82 | Ht 67.0 in | Wt 245.0 lb

## 2021-09-17 DIAGNOSIS — I10 Essential (primary) hypertension: Secondary | ICD-10-CM | POA: Diagnosis not present

## 2021-09-17 DIAGNOSIS — J4540 Moderate persistent asthma, uncomplicated: Secondary | ICD-10-CM | POA: Diagnosis not present

## 2021-09-17 DIAGNOSIS — R197 Diarrhea, unspecified: Secondary | ICD-10-CM | POA: Insufficient documentation

## 2021-09-17 DIAGNOSIS — J441 Chronic obstructive pulmonary disease with (acute) exacerbation: Secondary | ICD-10-CM | POA: Diagnosis not present

## 2021-09-17 DIAGNOSIS — A09 Infectious gastroenteritis and colitis, unspecified: Secondary | ICD-10-CM

## 2021-09-17 MED ORDER — BUDESONIDE 0.5 MG/2ML IN SUSP: 0.5000 mg | Freq: Two times a day (BID) | RESPIRATORY_TRACT | 11 refills | Status: DC

## 2021-09-17 MED ORDER — IPRATROPIUM-ALBUTEROL 0.5-2.5 (3) MG/3ML IN SOLN: 3.0000 mL | Freq: Four times a day (QID) | RESPIRATORY_TRACT | 3 refills | Status: DC

## 2021-09-17 MED ORDER — MONTELUKAST SODIUM 10 MG PO TABS: 10.0000 mg | ORAL_TABLET | Freq: Every day | ORAL | 11 refills | Status: DC

## 2021-09-17 MED ORDER — SODIUM CHLORIDE 3 % IN NEBU: INHALATION_SOLUTION | Freq: Three times a day (TID) | RESPIRATORY_TRACT | 5 refills | Status: DC

## 2021-09-17 NOTE — Progress Notes (Signed)
? ?Synopsis: Referred in December 2022 for Foreign Body in Bronchus by [name] ? ?Subjective:  ? ?PATIENT ID: Deborah [name] GENDER: female DOB: 12-27-1955, MRN: [id number] ? ?HPI ? ?Chief Complaint  ?Patient presents with  ? Hospitalization Follow-up  ?  Was in the ED a few weeks ago for increased SOB. Was told that she has a new spot on the right lung. States her breathing has been ok since being home.   ? ?Deborah [name] is a 66 year old woman, former smoker with laryngeal cancer s/p laryngectomy who returns to pulmonary clinic for asthma/COPD hospital after recent ER visit.  ? ?She went to the ER on 3/16 with chest pains that were worse with movements. She had CTA Chest done which was negative for PE. She had diffuse mosaic attenuation and persistent collapse of the left lower lobe.  ? ?She reports her allergies are acting up and she has been having more wheezing recently. She denies increased mucous production at this time.  ? ?OV 07/16/21 ?She called to be seen sooner due to coughing spells of recent that wake her from her sleep. She has significant coughing spell that will lead to shortness of breath and difficulty clearing mucous. She denies fevers, chills or sweats. ? ?She was started on flonase nasal spray and ipratropium nasal spray at last visit with some improvement in her sinus congestion but she continues to have a lot of mucous. She is using dulera and spiriva inhalers along with duoneb nebulizer treatments.  ? ?The mucous is clear to whitish with occasional blood tinged streaks.  ? ?Chest x-ray from 05/17/21 shows persistent left lower lobe atelectasis.  ? ?OV 05/17/21 ?Patient seen by her ENT, [name] with CT scan on 11/17 showing obstructing foreign body in the left mainstem bronchus with total post-obstructive atelectasis of lower lung. Appointment setup for today for further evaluation but she became symptomatic and presented to the ED on 11/26. She underwent bronchoscopy with foreign  body retrieval with Dr. Valeta Harms on 11/26. 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 She recovered well post-procedure and was then sent home from the ED.  ? ?She was seen yesterday by [name] with reported improvement in her breathing but continuing to have cough with mucous production.  ? ?She has been doing well since her hospital visit.  She continues to have cough with sputum production.  She also complains of significant postnasal drainage, more so than normal.  She denies any issues with aspiration.  She is being scheduled for esophagram and dilation. ? ?Past Medical History:  ?Diagnosis Date  ? Anemia   ? h/o of  ? Anxiety   ? 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 Asthma   ? Breast cancer ([place])   ? Bronchitis   ? COPD (chronic obstructive pulmonary disease) ([place])   ? Diverticulosis 11/15/2012  ? noted on screening colonoscopy   ? Dyspnea   ? with COPD exerbation  ? Esophageal stricture   ? Former smoker 03/19/2011  ? GERD (gastroesophageal reflux disease)   ? Heart murmur   ? asymptomatic   ? History of laryngectomy   ? History of radiation therapy 11/15/18- 12/06/18  ? Right Breast total dose 42.56 Gy in 16 fractions.   ? Hx of radiation therapy 09/03/10 to 10/16/2010  ? supraglottic larynx  ? Hypertension   ? 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 Internal hemorrhoid 11/15/2012  ? small, noted on screening colonoscopy   ?  Larynx cancer ([place]) 07/31/2010  ? supraglotttic s/p chemo/radiation and surgical rescection.  ? Leukocytopenia   ? Nausea alone 07/28/2013  ? Neck pain 01/21/2012  ? Normal MRI 07/14/2011  ? negative for mestasis   ? Pneumonia 2012  ? Sciatica   ? Seizures ([place])   ? 07/24/11 off Effexor w/o seizure  ? Sepsis ([place]) 08/04/2012  ? Sinusitis, chronic 07/20/2011  ? Bilateral maxillary, identified on MRI of head 07/14/11.    ? 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[place])   ?  ? ?Family History  ?Problem Relation Age of Onset  ? Heart disease Mother   ? Heart disease Father   ? Heart disease Sister   ? Cancer Brother   ?      type unknown  ? Liver disease Maternal Grandmother   ? Cancer Sister   ?     type unknown  ? Diabetes Sister   ? Breast cancer Neg Hx   ?  ? ?Social History  ? ?Socioeconomic History  ? Marital status: Divorced  ?  Spouse name: Not on file  ? Number of children: 2  ? Years of education: 47  ? Highest education level: Not on file  ?Occupational History  ? Occupation: part time  ?  Comment: warehouse work  ?Tobacco Use  ? Smoking status: Former  ?  Packs/day: 2.00  ?  Years: 20.00  ?  Pack years: 40.00  ?  Types: Cigarettes  ?  Quit date: 09/17/2010  ?  Years since quitting: 11.0  ? Smokeless tobacco: Never  ?Vaping Use  ? Vaping Use: Never used  ?Substance and Sexual Activity  ? Alcohol use: Yes  ?  Comment: Socially drinks   ? Drug use: No  ?  Comment: tried cocaine 1 time 2010 only used 1 time  ? Sexual activity: Yes  ?  Birth control/protection: Surgical  ?Other Topics Concern  ? Not on file  ?Social History Narrative  ? Lives with boyfriend  ? ?Social Determinants of Health  ? ?Financial Resource Strain: Not on file  ?Food Insecurity: Not on file  ?Transportation Needs: Not on file  ?Physical Activity: Not on file  ?Stress: Not on file  ?Social Connections: Not on file  ?Intimate Partner Violence: Not on file  ?  ? ?Allergies  ?Allergen Reactions  ? Lisinopril Cough  ?  ? ?Outpatient Medications Prior to Visit  ?Medication Sig Dispense Refill  ? acetaminophen (TYLENOL) 500 MG tablet Take 1,000 mg by mouth every 6 (six) hours as needed for moderate pain or headache.    ? albuterol (PROAIR HFA) 108 (90 Base) MCG/ACT inhaler Inhale 2 puffs into the lungs every 4 (four) hours as needed for wheezing or shortness of breath. 18 g 11  ? albuterol (PROVENTIL) (5 MG/ML) 0.5% nebulizer solution Take 0.5 mLs (2.5 mg total) by nebulization every 6 (six) hours as needed for wheezing or shortness of breath. Please give the patient 10 2m vials. 200 mL 5  ? amLODipine (NORVASC) 10 MG tablet Take 1 tablet by mouth daily (Patient  taking differently: Take 10 mg by mouth every morning.) 90 tablet 3  ? anastrozole (ARIMIDEX) 1 MG tablet Take 1 tablet (1 mg total) by mouth daily. 90 tablet 3  ? cetirizine (ZYRTEC) 10 MG tablet Take 1 tablet (10 mg total) by mouth daily. 90 tablet 1  ? escitalopram (LEXAPRO) 10 MG tablet Take 1 tablet (10 mg total) by mouth daily. 30 tablet 2  ? fluticasone (FLONASE) 50 MCG/ACT nasal  spray Use 1 spray(s) in each nostril once daily 16 g 0  ? Humidifiers MISC 1 application by Tracheal Tube route as needed. 1 each 0  ? ipratropium (ATROVENT) 0.03 % nasal spray Place 2 sprays into both nostrils every 12 (twelve) hours. 30 mL 12  ? levothyroxine (SYNTHROID) 200 MCG tablet Take 1 tablet (200 mcg total) by mouth daily before breakfast. 90 tablet 3  ? losartan-hydrochlorothiazide (HYZAAR) 100-12.5 MG tablet Take 1 tablet by mouth daily. 90 tablet 1  ? metoprolol tartrate (LOPRESSOR) 50 MG tablet Take 1 tablet (50 mg total) by mouth 2 (two) times daily. 180 tablet 3  ? nystatin cream (MYCOSTATIN) Apply 1 application topically daily as needed for dry skin. 30 g 1  ? omeprazole (PRILOSEC) 40 MG capsule Take 1 capsule by mouth twice daily (Patient taking differently: Take 40 mg by mouth in the morning and at bedtime.) 90 capsule 0  ? ondansetron (ZOFRAN) 4 MG tablet Take 1 tablet (4 mg total) by mouth every 8 (eight) hours as needed for nausea or vomiting. 40 tablet 0  ? sodium chloride HYPERTONIC 3 % nebulizer solution Take by nebulization as needed for other. (Patient taking differently: Take by nebulization 3 (three) times daily.) 750 mL 12  ? ipratropium-albuterol (DUONEB) 0.5-2.5 (3) MG/3ML SOLN Take 3 mLs by nebulization every 4 (four) hours as needed. (Patient taking differently: Take 3 mLs by nebulization every 4 (four) hours as needed (shortness of breath).) 360 mL 1  ? mometasone-formoterol (DULERA) 200-5 MCG/ACT AERO Inhale 2 puffs into the lungs 2 (two) times daily. 13 g 5  ? Tiotropium Bromide Monohydrate  (SPIRIVA RESPIMAT) 2.5 MCG/ACT AERS Inhale 2 puffs into the lungs daily. 12 g 2  ? aspirin EC 81 MG tablet Take 1 tablet (81 mg total) by mouth 2 (two) times daily. To be taken after surgery (Patient not t

## 2021-09-17 NOTE — Patient Instructions (Addendum)
Your right lung is the same as before, I believe this is an issue with the radiology report. I will talk with radiology to correct the report.  ? ?Stop dulera and spiriva inhalers.  ? ?Continue to use duoneb nebulizer treatment every 4-6 hours during the day ? ?Start budesonide neblizer treatment twice daily ? ?Start montelukast '10mg'$  daily for allergies ? ?Use the hypertonic saline nebulizer treatments as needed for increased chest congestion ([facility] neb treatment) ? ?Continue on zyrtec daily for allergies ? ?Continue on nasal sprays ? ?Follow up in 4 months.  ? ?

## 2021-09-17 NOTE — Addendum Note (Signed)
Addended by: Valerie Salts on: 09/17/2021 02:10 PM ? ? Modules accepted: Orders ? ?

## 2021-09-17 NOTE — Assessment & Plan Note (Signed)
Pt reports acute diarrheal illness over the last 1 week.  No recent travel, recent abx use or sick contacts. No recent take outs. Unclear etiology but it is likely infectious given that it is watery and now improving. It is reassuring that she is well hydrated and able to tolerate PO with no emesis. Pt does report one episode of black stools but no rectal bleeding which has now resolved, no further episodes. She is not on iron supplements or bismuth. Last colonoscopy was in 2014 and she is next due next year. Recent CBC 2 weeks was wnl. Normal vital signs today. Recommended she keeps an eye on her symptoms, if they do not improve or she has any further melena/rectal bleeding she should come back to the clinic and we will refer her to GI for a colonoscopy. Strict ER precautions given to pt. ?

## 2021-09-17 NOTE — Patient Instructions (Signed)
Thank you for coming to see me today. It was a pleasure. Today we discussed your BP:  ?Restart amlodipine '10mg'$  ?Continue lopressor ?Continue losartan-hczt combo pill ? ?We will get some labs today.  If they are abnormal or we need to do something about them, I will call you.  If they are normal, I will send you a message on MyChart (if it is active) or a letter in the mail.  If you don't hear from [place] in 2 weeks, please call the office at the number below.  ? ? ?For the diarrhea, could be a viral illness, keep an eye on your symptoms. If you get any black stools again or rectal bleeding come back in and we will refer you to GI for a colonoscopy and check a stool sample. Stay hydrated.   ? ?Please follow-up with me in 2 weeks  ? ?If you have any questions or concerns, please do not hesitate to call the office at [phone]. ? ?Best wishes,  ? ?Dr Posey Pronto   ?

## 2021-09-17 NOTE — Assessment & Plan Note (Addendum)
BP 136/88 today, however most of pt's home readings are consistently still elevated to above 262M systolic. She stopped her amlodipine after the previous visit because she got confused and thought she had to stop it after she started the losartan-hctz. Recommended that she continues losartan-hctz, lopressor and amlodipine and continues to keep BP diary at home twice a day. F/u in 2 weeks. Obtained repeat BMP today. Strict ER precautions given to pt. ?

## 2021-09-18 ENCOUNTER — Encounter: Payer: Self-pay | Admitting: Family Medicine

## 2021-09-18 LAB — BASIC METABOLIC PANEL
BUN/Creatinine Ratio: 17 (ref 12–28)
BUN: 17 mg/dL (ref 8–27)
CO2: 17 mmol/L — ABNORMAL LOW (ref 20–29)
Calcium: 10 mg/dL (ref 8.7–10.3)
Chloride: 103 mmol/L (ref 96–106)
Creatinine, Ser: 1.01 mg/dL — ABNORMAL HIGH (ref 0.57–1.00)
Glucose: 171 mg/dL — ABNORMAL HIGH (ref 70–99)
Potassium: 3.8 mmol/L (ref 3.5–5.2)
Sodium: 139 mmol/L (ref 134–144)
eGFR: 63 mL/min/{1.73_m2} (ref >59)

## 2021-09-19 ENCOUNTER — Ambulatory Visit (INDEPENDENT_AMBULATORY_CARE_PROVIDER_SITE_OTHER): Payer: Medicaid Other

## 2021-09-19 ENCOUNTER — Ambulatory Visit (INDEPENDENT_AMBULATORY_CARE_PROVIDER_SITE_OTHER): Payer: Medicaid Other | Admitting: Orthopaedic Surgery

## 2021-09-19 ENCOUNTER — Encounter: Payer: Self-pay | Admitting: Orthopaedic Surgery

## 2021-09-19 DIAGNOSIS — Z96652 Presence of left artificial knee joint: Secondary | ICD-10-CM

## 2021-09-19 NOTE — Progress Notes (Signed)
? ?Post-Op Visit Note ?  ?Patient: Deborah [name]           ?Date of Birth: Jul 01, 1955           ?MRN: [id number] ?Visit Date: 09/19/2021 ?PCP: Zola Button, MD ? ? ?Assessment & Plan: ? ?Chief Complaint:  ?Chief Complaint  ?Patient presents with  ? Left Knee - Pain, Follow-up  ? ?Visit Diagnoses:  ?1. Status post total left knee replacement   ? ? ?Plan: Patient is a pleasant 66 year old female who comes in today nearly 6 months status post left total knee replacement 04/01/2021.  She has been doing well.  She denies any pain.  She does have slight persistent swelling.  Overall, doing well.  Examination of her left knee reveals a fully healed surgical scar without complication.  Range of motion 0 to 110 degrees.  Stable valgus varus stress.  She is neurovascular tact distally.  At this point, she will continue to advance with activity as tolerated.  Dental prophylaxis reinforced.  Follow-up with [place] in 3 months time for repeat evaluation.  Call with concerns or questions. ? ?Follow-Up Instructions: Return in about 3 months (around 12/19/2021).  ? ?Orders:  ?Orders Placed This Encounter  ?Procedures  ? XR Knee 1-2 Views Left  ? ?No orders of the defined types were placed in this encounter. ? ? ?Imaging: ?No results found. ? ?PMFS History: ?Patient Active Problem List  ? Diagnosis Date Noted  ? Diarrhea 09/17/2021  ? FB bronchus   ? Status post total left knee replacement 04/01/2021  ? Primary osteoarthritis of left knee 03/31/2021  ? Peripheral polyneuropathy 12/12/2020  ? Status post total right knee replacement 12/12/2019  ? Primary osteoarthritis of right knee 12/11/2019  ? Localized swelling, mass and lump, neck 07/20/2019  ? Allergic rhinitis 05/20/2019  ? GERD (gastroesophageal reflux disease)   ? COPD (chronic obstructive pulmonary disease) ([facility])   ? Chronic diastolic (congestive) heart failure (HCC)   ? Breast cancer ([place]) 07/06/2018  ? 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 Major  depressive disorder 04/08/2018  ? Carpal tunnel syndrome 03/16/2018  ? Obesity 08/24/2017  ? Plantar fasciitis 06/06/2016  ? Low back pain 05/21/2016  ? Osteoarthritis of knees, bilateral 05/31/2015  ? Knee pain 01/04/2015  ? Dysphagia 08/16/2014  ? Weight gain 08/05/2014  ? DOE (dyspnea on exertion) 04/28/2014  ? Frequent falls 12/23/2013  ? Anemia in chronic illness 08/11/2012  ? Status post trachelectomy 08/04/2012  ? Chronic pain 01/20/2012  ? History of head and neck cancer 09/26/2011  ? Hx of radiation therapy   ? Tracheitis 07/17/2011  ? Seizure ([place]) 07/17/2011  ? Hypothyroidism 03/19/2011  ? Essential hypertension 03/19/2011  ? 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 ?Past Medical History:  ?Diagnosis Date  ? Anemia   ? h/o of  ? Anxiety   ? 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 Asthma   ? Breast cancer ([place])   ? Bronchitis   ? COPD (chronic obstructive pulmonary disease) ([facility])   ? Diverticulosis 11/15/2012  ? noted on screening colonoscopy   ? Dyspnea   ? with COPD exerbation  ? Esophageal stricture   ? Former smoker 03/19/2011  ? GERD (gastroesophageal reflux disease)   ? Heart murmur   ? asymptomatic   ? History of laryngectomy   ? History of radiation therapy 11/15/18- 12/06/18  ? Right Breast total dose 42.56 Gy in 16 fractions.   ? Hx of radiation  therapy 09/03/10 to 10/16/2010  ? supraglottic larynx  ? Hypertension   ? 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 Internal hemorrhoid 11/15/2012  ? small, noted on screening colonoscopy   ? 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  ? Leukocytopenia   ? Nausea alone 07/28/2013  ? Neck pain 01/21/2012  ? Normal MRI 07/14/2011  ? negative for mestasis   ? Pneumonia 2012  ? Sciatica   ? Seizures ([place])   ? 07/24/11 off Effexor w/o seizure  ? Sepsis ([place]) 08/04/2012  ? Sinusitis, chronic 07/20/2011  ? Bilateral maxillary, identified on MRI of head 07/14/11.    ? 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[place])   ?  ?Family History  ?Problem Relation Age of Onset   ? Heart disease Mother   ? Heart disease Father   ? Heart disease Sister   ? Cancer Brother   ?     type unknown  ? Liver disease Maternal Grandmother   ? Cancer Sister   ?     type unknown  ? Diabetes Sister   ? Breast cancer Neg Hx   ?  ?Past Surgical History:  ?Procedure Laterality Date  ? BREAST LUMPECTOMY Right 07/06/2018  ? BREAST LUMPECTOMY WITH RADIOACTIVE SEED LOCALIZATION Right 07/06/2018  ? Procedure: RIGHT BREAST LUMPECTOMY WITH RADIOACTIVE SEED LOCALIZATION;  Surgeon: Rolm Bookbinder, MD;  Location: [facility];  Service: General;  Laterality: Right;  ? COLONOSCOPY N/A 11/15/2012  ? Procedure: COLONOSCOPY;  Surgeon: Lafayette Dragon, MD;  [facility];  Service: Endoscopy;  Laterality: N/A;  ? CRYOTHERAPY  05/11/2021  ? Procedure: CRYOTHERAPY;  Surgeon: Garner Nash, DO;  [facility];  Service: Pulmonary;;  ? DENTAL RESTORATION/EXTRACTION WITH X-RAY    ? ESOPHAGEAL DILATION N/A 09/09/2019  ? Procedure: ESOPHAGEAL DILATION;  Surgeon: [name], MD;  Location: [facility];  Service: ENT;  Laterality: N/A;  ? ESOPHAGEAL DILATION N/A 10/05/2019  ? Procedure: ESOPHAGEAL DILATION;  Surgeon: [name], MD;  Location: [facility];  Service: ENT;  Laterality: N/A;  via tracheostomy  ? ESOPHAGEAL DILATION N/A 02/27/2020  ? Procedure: ESOPHAGEAL DILATION;  Surgeon: [name], MD;  Location: [facility];  Service: ENT;  Laterality: N/A;  ? ESOPHAGOGASTRODUODENOSCOPY (EGD) WITH PROPOFOL N/A 08/12/2018  ? Procedure: ESOPHAGOGASTRODUODENOSCOPY (EGD) WITH PROPOFOL;  Surgeon: Doran Stabler, MD;  Location: [facility];  Service: Gastroenterology;  Laterality: N/A;  ? ESOPHAGOSCOPY  06/21/2012  ? Procedure: ESOPHAGOSCOPY;  Surgeon: [name], MD;  Location: [facility];  Service: ENT;  Laterality: N/A;  ? ESOPHAGOSCOPY N/A 06/24/2021  ? Procedure: ESOPHAGOSCOPY WITH DILATATION;  Surgeon: [name], MD;  Location: [facility];  Service: ENT;  Laterality: N/A;  ? ESOPHAGOSCOPY WITH DILITATION N/A 09/21/2014  ? Procedure: ESOPHAGOSCOPY WITH DILITATION;  Surgeon: [name], MD;  Location: [facility];  Service: ENT;  Laterality: N/A;  ? ESOPHAGOSCOPY WITH DILITATION N/A 07/04/2016  ? Procedure: ESOPHAGOSCOPY WITH DILITATION;  Surgeon: [name], MD;  Location: [facility];  Service: ENT;  Laterality: N/A;  ? ESOPHAGOSCOPY WITH DILITATION N/A 12/01/2017  ? Procedure: ESOPHAGOSCOPY WITH DILITATION;  Surgeon: [name], MD;  Location: [facility];  Service: ENT;  Laterality: N/A;  ? ESOPHAGOSCOPY WITH DILITATION N/A 04/19/2018  ? Procedure: ESOPHAGOSCOPY WITH DILITATION;  Surgeon: [name], MD;  Location: [facility];  Service: ENT;  Laterality: N/A;  ? ESOPHAGOSCOPY WITH DILITATION N/A 03/07/2019  ? Procedure: Esophagoscopy with  dilatation;  Surgeon: [name], MD;  Location: [facility];  Service: ENT;  Laterality: N/A;  ? ESOPHAGOSCOPY WITH DILITATION N/A 07/16/2020  ? Procedure: ESOPHAGOSCOPY WITH DILATION;  Surgeon: [name], MD;  Location: [facility];  Service: ENT;  Laterality: N/A;  ? ESOPHAGOSCOPY WITH DILITATION N/A 10/08/2020  ? Procedure: ESOPHAGOSCOPY With Dilation;  Surgeon: [name], MD;  Location: [facility];  Service: ENT;  Laterality: N/A;  21 French to 59 french  ? FLEXIBLE BRONCHOSCOPY  01/08/2018  ?    ? FOREIGN BODY REMOVAL  05/11/2021  ? Procedure: FOREIGN BODY REMOVAL;  Surgeon: Garner Nash, DO;  Location[facility];  Service: Pulmonary;;  ? FOREIGN BODY REMOVAL BRONCHIAL  10/02/2011  ? Procedure: REMOVAL FOREIGN BODY BRONCHIAL;  Surgeon: Ruby Cola, MD;  Location: [facility];  Service: ENT;  Laterality: N/A;  ? FOREIGN BODY REMOVAL BRONCHIAL N/A 01/08/2018  ? Procedure: REMOVAL FOREIGN BODY BRONCHIAL;  Surgeon: Jodi Marble, MD;  [facility];  Service: ENT;  Laterality: N/A;  ? HEMOSTASIS CONTROL  05/11/2021  ? Procedure: HEMOSTASIS CONTROL;  Surgeon: Garner Nash,  DO;  Location: [facility];  Service: Pulmonary;;  ? LARYNGECTOMY    ? Porta cath removal    ? PORTACATH PLACEMENT  09/17/10  ? Tip in cavoatrial junction  ? RE-EXCISION OF BREAST CANCER,SUPERIOR MARGINS Rig

## 2021-09-30 ENCOUNTER — Ambulatory Visit: Payer: Medicaid Other | Admitting: Family Medicine

## 2021-09-30 ENCOUNTER — Encounter: Payer: Self-pay | Admitting: Family Medicine

## 2021-09-30 DIAGNOSIS — I10 Essential (primary) hypertension: Secondary | ICD-10-CM

## 2021-09-30 MED ORDER — LOSARTAN POTASSIUM-HCTZ 100-25 MG PO TABS: 1.0000 | ORAL_TABLET | Freq: Every day | ORAL | 0 refills | Status: DC

## 2021-09-30 NOTE — Patient Instructions (Addendum)
Thank you for coming to see me today. It was a pleasure. Today we discussed your blood pressure .  ? ?Your BP is not controlled ?Your goal is to have a BP average < 140/90 ?Medicine Changes: increased hyzaar to 100/'25mg'$  ?Homework: BP log twice a day  and bring to next visit ?Go to the ED: BP > 180-220/100-110 persistently with symptoms such as headache, vision changes, chest pain etc. ? ?Contact your insurance to see if the shingle vaccine is  ? ?Come back to see [place] in: 2 weeks ? ?If you have any questions or concerns, please do not hesitate to call the office at [phone]. ? ?Best wishes,  ? ?Dr Posey Pronto   ?

## 2021-09-30 NOTE — Assessment & Plan Note (Signed)
BP elevated to 181/94, repeat 148/82.  Most of the home readings have been between 256 and 389 systolic.  Patient remains asymptomatic.  Increased Hyzaar dose to 100/25 mg.  Encouraged weight loss and low-sodium diet to help with hypertension management, patient has lost approximately 5 pounds since the previous visit. follow-up in 2 weeks.  Still remains hypertensive would consider increasing metoprolol dose/adding spironolactone. ?

## 2021-09-30 NOTE — Progress Notes (Signed)
? ? ? ?  SUBJECTIVE:  ? ?CHIEF COMPLAINT / HPI:  ? ?Deborah [name] is a 66 y.o. female presents for HTN follow up  ? ? ?Hypertension ?Patient's current antihypertensive  medications include: amlodipine, hyzaar and metoprolol.   Compliant with medications and tolerating well without side effects.  Checking BP at home with readings between 160 and 180.  She left her BP diary at home today.  Denies any SOB, CP, vision changes, LE edema, medication SEs, or symptoms of hypotension.  ? ?Most recent creatinine trend:  ?Lab Results  ?Component Value Date  ? CREATININE 1.01 (H) 09/17/2021  ? CREATININE 0.93 08/29/2021  ? CREATININE 1.25 (H) 06/13/2021  ?  ? ?Patient has had a BMP in the past 1 year. ? ? ?[facility] Office Visit from 09/30/2021 in [place] [facility]  ?PHQ-9 Total Score 2  ? ?  ?   ?PERTINENT  PMH / PSH: HTN, CHF, COPD  ? ?OBJECTIVE:  ? ?BP (!) 148/82   Pulse 73   Ht '5\' 7"'$  (1.702 m)   Wt 240 lb 12.8 oz (109.2 kg)   SpO2 92%   BMI 37.71 kg/m?   ? ?General: Alert, no acute distress ?Cardio: Well-perfused ?Pulm:  normal work of breathing ?Neuro: Cranial nerves grossly intact  ? ?ASSESSMENT/PLAN:  ? ?Essential hypertension ?BP elevated to 181/94, repeat 148/82.  Most of the home readings have been between 998 and 338 systolic.  Patient remains asymptomatic.  Increased Hyzaar dose to 100/25 mg.  Encouraged weight loss and low-sodium diet to help with hypertension management, patient has lost approximately 5 pounds since the previous visit. follow-up in 2 weeks.  Still remains hypertensive would consider increasing metoprolol dose/adding spironolactone. ?  ? ?Lattie Haw, MD PGY-3 ?[facility]  ?

## 2021-10-03 ENCOUNTER — Telehealth: Payer: Self-pay | Admitting: Orthopaedic Surgery

## 2021-10-03 DIAGNOSIS — Z8521 Personal history of malignant neoplasm of larynx: Secondary | ICD-10-CM | POA: Diagnosis not present

## 2021-10-03 NOTE — Telephone Encounter (Signed)
Form received. Has patient been released to return to work? Please provide note. Thanks ?

## 2021-10-03 NOTE — Telephone Encounter (Signed)
Yes release back to work. No restrictions

## 2021-10-03 NOTE — Telephone Encounter (Signed)
Note made.  

## 2021-10-03 NOTE — Telephone Encounter (Signed)
Please advise on work status.  

## 2021-10-07 ENCOUNTER — Other Ambulatory Visit: Payer: Self-pay | Admitting: Family Medicine

## 2021-10-14 ENCOUNTER — Encounter: Payer: Self-pay | Admitting: Family Medicine

## 2021-10-14 ENCOUNTER — Ambulatory Visit: Payer: Medicaid Other | Admitting: Family Medicine

## 2021-10-14 VITALS — BP 142/74 | HR 95 | Ht 67.0 in | Wt 242.0 lb

## 2021-10-14 DIAGNOSIS — E039 Hypothyroidism, unspecified: Secondary | ICD-10-CM

## 2021-10-14 DIAGNOSIS — F339 Major depressive disorder, recurrent, unspecified: Secondary | ICD-10-CM

## 2021-10-14 DIAGNOSIS — I10 Essential (primary) hypertension: Secondary | ICD-10-CM

## 2021-10-14 MED ORDER — ESCITALOPRAM OXALATE 20 MG PO TABS: 20.0000 mg | ORAL_TABLET | Freq: Every day | ORAL | 2 refills | Status: DC

## 2021-10-14 NOTE — Progress Notes (Signed)
? ? ?  SUBJECTIVE:  ? ?CHIEF COMPLAINT / HPI:  ? ?Hypertension ?- Medications: Lopressor '50mg'$  BID, Hyzaar 100-25 mg daily, amlodipine 10 mg daily ?- Compliance: Yes ?- Checking BP at home: Yes home cuff giving readings of 170s SBP ?- Denies any SOB, CP, vision changes, LE edema, medication SEs, or symptoms of hypotension ? ?Depressed mood ?Not feeling as though she wants to do anything on most days. She would rather stay in the bed. She feels tired most days. She denies SI, fever, night sweats. She is taking lexapro as prescribed. Endorses a mood boost when medication was initiated but has not had a dose increase in the recent past. She is open to therapy.  ? ?PERTINENT  PMH / PSH: Hypothyroidism, major depressive disorder ? ?OBJECTIVE:  ? ?BP (!) 142/74   Pulse 95   Ht '5\' 7"'$  (1.702 m)   Wt 242 lb (109.8 kg)   SpO2 96%   BMI 37.90 kg/m?   ? ?  10/14/2021  ?  2:02 PM 09/30/2021  ?  1:46 PM 09/17/2021  ?  8:54 AM  ?PHQ9 SCORE ONLY  ?PHQ-9 Total Score '13 2 3  '$ ? ?General: Appears well, no acute distress. Age appropriate. ?Cardiac: RRR, normal heart sounds, no murmurs ?Respiratory: CTAB, normal effort ?Neuro: alert and oriented, no focal deficits ?Psych: normal affect ? ?ASSESSMENT/PLAN:  ? ?1. Essential hypertension ?Continues to be slightly elevated. Home BP cuff with higher readings. Hyzaar increased 2 weeks ago. Will obtain BMP. Plan to follow up with patient to bringing in home BP cuff.  ?- Basic Metabolic Panel ? ?2. Depression, recurrent ([place]) ?Increased PHQ9 score. Endorsing isolation and fatigue. Increase dose of lexapro. Resources for therapy given. Follow up in 2 weeks for mood check.  ?- escitalopram (LEXAPRO) 20 MG tablet; Take 1 tablet (20 mg total) by mouth daily.  Dispense: 30 tablet; Refill: 2 ? ?3. Hypothyroidism, unspecified type ?Endorsing fatigue. Elevated TSH to 56 12/11/2020, most recently 0.9 04/16/21. Repeat today and make adjustment as necessary.  ?- TSH ? ?Krisi Azua Autry-Lott, DO ?[place]  ?

## 2021-10-14 NOTE — Patient Instructions (Addendum)
We discussed follow-up later this week please bring your blood pressure cuff we will recheck your blood pressure with our cuff and your cuff. ?Below are therapy resources I also increased your Lexapro dose.  We will follow-up in 2 weeks for a mood check in. ? ?Therapy and Counseling Resources ?Most providers on this list will take Medicaid. Patients with commercial insurance or Medicare should contact their insurance company to get a list of in network providers. ? ?[facility] (spanish speaking therapist available)(habla espanol)(take medicare and medicaid)  ?[place], [place], [place] [address], [place] ?[email or web address] ?[phone] ? ?[facility] ?[place]. [place], [place] [address] ?[phone] ? ?[facility]  ? [address], [place], [place] [address]      [phone] ? ?[facility] (spanish available) ?[place], [place] [address] ?[phone] ? ?[facility] (take medicaid and medicare) ?[address] [place], [place] [address]       [phone]    ? ?[facility] (virtual only) ?[phone] ? ?[facility] ?[address], [place], [place] ? ?[facility]:  [address] [place] ? ?[facility]:  ?[place] [phone] ? ?[facility] (under & uninsured) ?[address], [place] [phone]    [email or web address]   ? ?[facility] ?[place] [address]  [place]    [phone] ? ?[facility] ?[place]     Phone:  [phone]     [place]  [phone]  ? ?[facility] ?[address] [place], [place] ext [phone] ? ?[facility]: [place], [place], [place]  ? ?[facility]  ([place] therapist) [email or web address]  ?[place] [address]   [place] [address]    [phone]   ? ?[facility]   ?[address] [place],  [place], [place]  ? ?[facility]  ?[address] [place]  [phone] ? ?[facility]  ?[place], [place]        [phone] ? ?Open Access/Walk In Clinic under & uninsured ? ?[facility]  ?[place], [place] ?[place] [phone] ?[place] [phone] ? ?[facility] [place],  ?([place])   [place]: [phone]) 8:30 - 12; 1 - 2:30 ? ?[facility] [place],  ?[address] [place]    [phone]):8:30 - 12; 2 - 3PM ? ?[facility],  ?[address],  [place]; [phone]):   Mon - Fri 8 AM - 5 PM ? ?[facility] ?[place]  MWF 12:30 to 3:00 or call to schedule an appointment  [phone] ? ?Specific Provider options ?[facility] Today  [email or web address] ?click on find a therapist  ?enter your zip code ?left side and select or tailor a therapist for your specific need.  ? ?[facility] Provider Directory ?[email or web address]  (Medicaid)   Follow all drop down to find a provider ? ?Social Support program ?[place] ?336) H3156881 or [email or web address] ?[address], [place], [place] Recovery support and educational  ? ?24- Hour Availability:  ? ?[facility]  ?[place]  [place], [place] ?[place] [phone] ?[place] [phone] ? ?[facility] [employer] [phone] ? ?[facility]  [phone]  ? ?[place]  [phone] (after hours) ? ?Therapeutic Alternative/Mobile Crisis   [phone] ? ?[place] National Suicide Hotline  [phone] Diamantina Monks) ? ?Call 911 or go to emergency  room ? ?[employer]  [phone]);  [place] and [place]  ? ?[facility]  ?[phone]); [place], [place], [place], [place], [place], [place], [place] ? ? ? ?

## 2021-10-15 LAB — BASIC METABOLIC PANEL
BUN/Creatinine Ratio: 21 (ref 12–28)
BUN: 31 mg/dL — ABNORMAL HIGH (ref 8–27)
CO2: 19 mmol/L — ABNORMAL LOW (ref 20–29)
Calcium: 9.9 mg/dL (ref 8.7–10.3)
Chloride: 96 mmol/L (ref 96–106)
Creatinine, Ser: 1.45 mg/dL — ABNORMAL HIGH (ref 0.57–1.00)
Glucose: 100 mg/dL — ABNORMAL HIGH (ref 70–99)
Potassium: 4 mmol/L (ref 3.5–5.2)
Sodium: 135 mmol/L (ref 134–144)
eGFR: 41 mL/min/{1.73_m2} — ABNORMAL LOW (ref >59)

## 2021-10-15 LAB — TSH: TSH: 24.8 u[IU]/mL — ABNORMAL HIGH (ref 0.450–4.500)

## 2021-10-16 DIAGNOSIS — C329 Malignant neoplasm of larynx, unspecified: Secondary | ICD-10-CM | POA: Diagnosis not present

## 2021-10-16 DIAGNOSIS — C321 Malignant neoplasm of supraglottis: Secondary | ICD-10-CM | POA: Diagnosis not present

## 2021-10-16 DIAGNOSIS — Z930 Tracheostomy status: Secondary | ICD-10-CM | POA: Diagnosis not present

## 2021-10-18 ENCOUNTER — Ambulatory Visit: Payer: Medicaid Other | Admitting: Family Medicine

## 2021-10-18 ENCOUNTER — Encounter: Payer: Self-pay | Admitting: Family Medicine

## 2021-10-18 ENCOUNTER — Telehealth: Payer: Self-pay | Admitting: Family Medicine

## 2021-10-18 VITALS — BP 126/82 | HR 85 | Wt 242.4 lb

## 2021-10-18 DIAGNOSIS — I10 Essential (primary) hypertension: Secondary | ICD-10-CM | POA: Diagnosis not present

## 2021-10-18 MED ORDER — LEVOTHYROXINE SODIUM 50 MCG PO TABS: ORAL_TABLET | ORAL | 0 refills | Status: DC

## 2021-10-18 NOTE — Patient Instructions (Signed)
It was wonderful to see you today. ? ?Please bring ALL of your medications with you to every visit.  ? ?Today we talked about: ? ?-Continue current blood pressure medication ?- Follow-up in 2 weeks for a mood check and to check your kidney function ? ?Please be sure to schedule follow up at the front  desk before you leave today.  ? ?Please call the clinic at [phone] if your symptoms worsen or you have any concerns. It was our pleasure to serve you. ? ?Dr. Janus Molder ? ?

## 2021-10-18 NOTE — Telephone Encounter (Signed)
Called patient and discussed results. Will recheck BMP in 2 weeks (hyzaar recently increased and Cr elevated). Will increase synthroid dose to 225 mcg (200+25) and recheck TSH with T3, T4 in 8-10 weeks.  ? ?Gerlene Fee, DO ?10/18/2021, 9:52 AM ?PGY-3, [place] ? ?

## 2021-10-18 NOTE — Progress Notes (Signed)
? ? ?  SUBJECTIVE:  ? ?CHIEF COMPLAINT / HPI:  ? ?Hypertension, follow up from 5/1 ?- Medications: Lopressor '50mg'$  BID, Hyzaar 100-25 mg daily, amlodipine 10 mg daily ?- Compliance: Yes ?- Checking BP at home: Yes home cuff giving readings of 170s; today reading on home cuff was 128/77 ?- Denies any SOB, CP, vision changes, LE edema, medication SEs, or symptoms of hypotension ? ?PERTINENT  PMH / PSH: Depressed mood ? ?OBJECTIVE:  ? ?BP 126/82   Pulse 85   Wt 242 lb 6.4 oz (110 kg)   SpO2 94%   BMI 37.97 kg/m?   ?General: Appears well, no acute distress. Age appropriate. ?Cardiac: RRR, normal heart sounds, no murmurs ?Respiratory: CTAB, normal effort ?Abdomen: soft, nontender, nondistended ?Extremities: No edema or cyanosis. ?Skin: Warm and dry, no rashes noted ?Neuro: alert and oriented, no focal deficits ?Psych: normal affect ? ? ?ASSESSMENT/PLAN:  ? ?Essential hypertension ?BP at goal. Home cuff measuring appropriately. Continue current medications. Obtain BMP at follow up for mood check due to recent increase in medication.  ? ?  ? ? ?[place] Violet Autry-Lott, DO ?[place]  ?

## 2021-10-24 ENCOUNTER — Telehealth: Payer: Self-pay | Admitting: Family Medicine

## 2021-10-24 NOTE — Telephone Encounter (Signed)
Called patient to discuss feedback from recent clinic visit and request for PCP switch. I did apologize if she felt any of her concerns were not addressed properly. ? ?After our discussion, she will hold off on PCP switch but will still think about it. I offered to make a follow-up appointment - she stated she would set it up on her own. ?

## 2021-10-25 ENCOUNTER — Ambulatory Visit: Payer: Medicaid Other | Admitting: Family Medicine

## 2021-10-30 ENCOUNTER — Encounter: Payer: Self-pay | Admitting: Family Medicine

## 2021-10-30 ENCOUNTER — Ambulatory Visit: Payer: Medicaid Other | Admitting: Family Medicine

## 2021-10-30 VITALS — BP 125/80 | HR 86 | Temp 98.7°F | Ht 67.0 in | Wt 238.2 lb

## 2021-10-30 DIAGNOSIS — I10 Essential (primary) hypertension: Secondary | ICD-10-CM

## 2021-10-30 DIAGNOSIS — F339 Major depressive disorder, recurrent, unspecified: Secondary | ICD-10-CM

## 2021-10-30 NOTE — Progress Notes (Signed)
? ? ?  SUBJECTIVE:  ? ?CHIEF COMPLAINT / HPI:  ? ?Mood check ?She is feeling excited today. She does not desire to increase lexapro. She is also more energized and spending more time with her husband. They will be going on a trip to [place] soon to celebrate their anniversary.  ? ?Hypertension ?- Medications: Amlodipine 10 mg, Hyzaar 100-25 mg daily, metoprolol 50 mg daily ?- Compliance: Yes ?- Denies any SOB, CP, vision changes, LE edema, medication SEs, or symptoms of hypotension ? ?PERTINENT  PMH / PSH: As above ? ?OBJECTIVE:  ? ?BP 125/80   Pulse 86   Temp 98.7 ?F (37.1 ?C)   Wt 238 lb 3.2 oz (108 kg)   SpO2 95%   BMI 37.31 kg/m?   ? ?  10/30/2021  ?  9:16 AM 10/18/2021  ?  2:35 PM 10/14/2021  ?  2:02 PM  ?PHQ9 SCORE ONLY  ?PHQ-9 Total Score '3 7 13  '$ ?General: Appears well, no acute distress. Age appropriate. ?Respiratory: normal effort ?Neuro: alert and oriented ?Psych: normal affect ? ?ASSESSMENT/PLAN:  ? ?1. Depression, recurrent ([place]) ?Improved PHQ-9.  Continue Lexapro 20 mg daily. ? ?2. Essential hypertension ?Continue current medications.  Needs BMP at follow-up. ?- Basic Metabolic Panel; Future ? ?Encouraged to return in 6 weeks for repeat TSH. ? ? ?Martha Ellerby Autry-Lott, DO ?[place]  ?

## 2021-10-30 NOTE — Patient Instructions (Signed)
We generally do not recommend medications to patients who are not prone to significant motion sickness because of the potential for sedation and other anticholinergic side effects.  ? ?If you still desire to take medication. You can take 1 tablet of benadryl OTC 30 minutes prior to the event.  ? ?Follow up in 6 weeks at the earliest for TSH check.  ? ?Continue blood pressure medication and mood medication.  ?

## 2021-11-19 ENCOUNTER — Encounter: Payer: Self-pay | Admitting: *Deleted

## 2021-11-27 ENCOUNTER — Other Ambulatory Visit: Payer: Self-pay | Admitting: Pulmonary Disease

## 2021-11-27 DIAGNOSIS — R0982 Postnasal drip: Secondary | ICD-10-CM

## 2021-11-29 DIAGNOSIS — C321 Malignant neoplasm of supraglottis: Secondary | ICD-10-CM | POA: Diagnosis not present

## 2021-11-29 DIAGNOSIS — C329 Malignant neoplasm of larynx, unspecified: Secondary | ICD-10-CM | POA: Diagnosis not present

## 2021-11-29 DIAGNOSIS — Z930 Tracheostomy status: Secondary | ICD-10-CM | POA: Diagnosis not present

## 2021-12-19 ENCOUNTER — Ambulatory Visit: Payer: Medicaid Other | Admitting: Orthopaedic Surgery

## 2021-12-20 DIAGNOSIS — Z448 Encounter for fitting and adjustment of other external prosthetic devices: Secondary | ICD-10-CM | POA: Diagnosis not present

## 2021-12-20 DIAGNOSIS — Z8521 Personal history of malignant neoplasm of larynx: Secondary | ICD-10-CM | POA: Diagnosis not present

## 2021-12-20 DIAGNOSIS — Z9002 Acquired absence of larynx: Secondary | ICD-10-CM | POA: Diagnosis not present

## 2021-12-25 ENCOUNTER — Encounter: Payer: Self-pay | Admitting: Orthopaedic Surgery

## 2021-12-25 ENCOUNTER — Ambulatory Visit (INDEPENDENT_AMBULATORY_CARE_PROVIDER_SITE_OTHER): Payer: Medicaid Other | Admitting: Orthopaedic Surgery

## 2021-12-25 ENCOUNTER — Ambulatory Visit (INDEPENDENT_AMBULATORY_CARE_PROVIDER_SITE_OTHER): Payer: Medicaid Other

## 2021-12-25 DIAGNOSIS — Z96652 Presence of left artificial knee joint: Secondary | ICD-10-CM

## 2021-12-25 NOTE — Progress Notes (Signed)
Office Visit Note   Patient: Deborah [name]           Date of Birth: 1956/05/09           MRN: [id number] Visit Date: 12/25/2021              Requested by: Zola Button, MD [place],  [place] [id number] PCP: Zola Button, MD   Assessment & Plan: Visit Diagnoses:  1. Status post total left knee replacement     Plan: Tarsha has done very well from her surgery.  She is very happy overall.  Her knees are functioning well.  Dental prophylaxis reinforced.  Recheck in another year with two-view x-rays of bilateral knees.  Follow-Up Instructions: Return in about 1 year (around 12/26/2022).   Orders:  Orders Placed This Encounter  Procedures   XR KNEE 3 VIEW LEFT   No orders of the defined types were placed in this encounter.     Procedures: No procedures performed   Clinical Data: No additional findings.   Subjective: Chief Complaint  Patient presents with   Left Knee - Follow-up    Left total knee arthroplasty 04/01/2021    HPI Kareem is 9 months status post left total knee replacement.  Doing well overall.  No complaints other than some occasional stiffness after activity or immobility  Review of Systems  Constitutional: Negative.   HENT: Negative.    Eyes: Negative.   Respiratory: Negative.    Cardiovascular: Negative.   Endocrine: Negative.   Musculoskeletal: Negative.   Neurological: Negative.   Hematological: Negative.   Psychiatric/Behavioral: Negative.    All other systems reviewed and are negative.    Objective: Vital Signs: There were no vitals taken for this visit.  Physical Exam Vitals and nursing note reviewed.  Constitutional:      Appearance: She is well-developed.  Pulmonary:     Effort: Pulmonary effort is normal.  Skin:    General: Skin is warm.     Capillary Refill: Capillary refill takes less than 2 seconds.  Neurological:     Mental Status: She is alert and oriented to person, place, and time.  Psychiatric:         Behavior: Behavior normal.        Thought Content: Thought content normal.        Judgment: Judgment normal.     Ortho Exam Examination left knee shows a fully healed surgical scar.  Excellent functional range of motion.  Flexion to greater than 120 degrees.  Stable to varus valgus stress. Specialty Comments:  No specialty comments available.  Imaging: XR KNEE 3 VIEW LEFT  Result Date: 12/25/2021 Stable total knee replacements  without complication    PMFS History: Patient Active Problem List   Diagnosis Date Noted   FB bronchus    Status post total left knee replacement 04/01/2021   Primary osteoarthritis of left knee 03/31/2021   Peripheral polyneuropathy 12/12/2020   Status post total right knee replacement 12/12/2019   Primary osteoarthritis of right knee 12/11/2019   Localized swelling, mass and lump, neck 07/20/2019   Allergic rhinitis 05/20/2019   GERD (gastroesophageal reflux disease)    COPD (chronic obstructive pulmonary disease) (HCC)    Chronic diastolic (congestive) heart failure ([place])    Breast cancer ([name]) 07/06/2018   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   Major depressive disorder 04/08/2018   Carpal tunnel syndrome 03/16/2018   Obesity 08/24/2017   Plantar fasciitis 06/06/2016  Osteoarthritis of knees, bilateral 05/31/2015   Knee pain 01/04/2015   Dysphagia 08/16/2014   DOE (dyspnea on exertion) 04/28/2014   Frequent falls 12/23/2013   Anemia in chronic illness 08/11/2012   Status post trachelectomy 08/04/2012   Chronic pain 01/20/2012   History of head and neck cancer 09/26/2011   Hx of radiation therapy    Tracheitis 07/17/2011   Seizure ([place] [facility]) 07/17/2011   Hypothyroidism 03/19/2011   Essential hypertension 03/19/2011   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   Leukocytopenia    Nausea alone 07/28/2013   Neck pain 01/21/2012   Normal MRI 07/14/2011   negative for mestasis    Pneumonia 2012   Sciatica    Seizures ([place])    07/24/11 off Effexor w/o seizure   Sepsis ([place]) 08/04/2012   Sinusitis, chronic 07/20/2011   Bilateral maxillary, identified on MRI of head 07/14/11.     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[place])     Family History  Problem Relation Age of Onset   Heart disease Mother    Heart disease Father    Heart disease Sister    Cancer Brother        type unknown   Liver disease Maternal Grandmother    Cancer Sister        type unknown   Diabetes Sister    Breast cancer Neg Hx     Past Surgical History:  Procedure Laterality Date   BREAST LUMPECTOMY Right 07/06/2018   BREAST LUMPECTOMY WITH RADIOACTIVE SEED LOCALIZATION Right 07/06/2018   Procedure: RIGHT BREAST LUMPECTOMY WITH RADIOACTIVE SEED LOCALIZATION;  Surgeon: Rolm Bookbinder, MD;  Location: [facility];  Service: General;  Laterality: Right;   COLONOSCOPY N/A 11/15/2012   Procedure: COLONOSCOPY;  Surgeon: Lafayette Dragon, MD;  [facility];  Service: Endoscopy;  Laterality: N/A;   CRYOTHERAPY  05/11/2021   Procedure: CRYOTHERAPY;  Surgeon: Garner Nash, DO;  Location: [facility];  Service:  Pulmonary;;   DENTAL RESTORATION/EXTRACTION WITH X-RAY     ESOPHAGEAL DILATION N/A 09/09/2019   Procedure: ESOPHAGEAL DILATION;  Surgeon: [name], MD;  Location: [facility];  Service: ENT;  Laterality: N/A;   ESOPHAGEAL DILATION N/A 10/05/2019   Procedure: ESOPHAGEAL DILATION;  Surgeon: [name], MD;  Location: [facility];  Service: ENT;  Laterality: N/A;  via tracheostomy   [place] N/A 02/27/2020   Procedure: ESOPHAGEAL DILATION;  Surgeon: [name], MD;  Location: [facility];  Service: ENT;  Laterality: N/A;   ESOPHAGOGASTRODUODENOSCOPY (EGD) WITH PROPOFOL N/A 08/12/2018   Procedure: ESOPHAGOGASTRODUODENOSCOPY (EGD) WITH PROPOFOL;  Surgeon: Doran Stabler, MD;  Location: [facility];  Service: Gastroenterology;  Laterality: N/A;   ESOPHAGOSCOPY  06/21/2012   Procedure: ESOPHAGOSCOPY;  Surgeon: [name], MD;  Location: [facility];  Service: ENT;  Laterality: N/A;   ESOPHAGOSCOPY N/A 06/24/2021   Procedure: ESOPHAGOSCOPY WITH DILATATION;  Surgeon: [name], MD;  Location: [facility];  Service: ENT;  Laterality: N/A;   ESOPHAGOSCOPY WITH DILITATION N/A 09/21/2014   Procedure: ESOPHAGOSCOPY WITH DILITATION;  Surgeon: [name], MD;  Location: [facility];  Service: ENT;  Laterality: N/A;   ESOPHAGOSCOPY WITH DILITATION N/A 07/04/2016   Procedure: ESOPHAGOSCOPY WITH DILITATION;  Surgeon: [name], MD;  Location: [facility];  Service: ENT;  Laterality: N/A;   ESOPHAGOSCOPY WITH DILITATION N/A 12/01/2017   Procedure: ESOPHAGOSCOPY WITH DILITATION;  Surgeon: [name], MD;  Location: [facility];  Service: ENT;  Laterality: N/A;   ESOPHAGOSCOPY WITH DILITATION N/A 04/19/2018   Procedure: ESOPHAGOSCOPY WITH DILITATION;  Surgeon: [name], MD;  Location: [facility];  Service: ENT;  Laterality: N/A;   ESOPHAGOSCOPY WITH DILITATION N/A 03/07/2019   Procedure: Esophagoscopy with dilatation;  Surgeon: [name], MD;   Location: [facility];  Service: ENT;  Laterality: N/A;   ESOPHAGOSCOPY WITH DILITATION N/A 07/16/2020   Procedure: ESOPHAGOSCOPY WITH DILATION;  Surgeon: [name], MD;  Location: [facility];  Service: ENT;  Laterality: N/A;   [place] N/A 10/08/2020   Procedure: ESOPHAGOSCOPY With Dilation;  Surgeon: [name], MD;  Location: [facility];  Service: ENT;  Laterality: N/A;  [address] [place]  01/08/2018       FOREIGN BODY REMOVAL  05/11/2021   Procedure: FOREIGN BODY REMOVAL;  Surgeon: Garner Nash, DO;  Location: [facility];  Service: Pulmonary;;   FOREIGN BODY REMOVAL BRONCHIAL  10/02/2011   Procedure: REMOVAL FOREIGN BODY BRONCHIAL;  Surgeon: Ruby Cola, MD;  Location: [facility];  Service: ENT;  Laterality: N/A;   FOREIGN BODY REMOVAL BRONCHIAL N/A 01/08/2018   Procedure: REMOVAL FOREIGN BODY BRONCHIAL;  Surgeon: Jodi Marble, MD;  [facility];  Service: ENT;  Laterality: N/A;   HEMOSTASIS CONTROL  05/11/2021   Procedure: HEMOSTASIS CONTROL;  Surgeon: Garner Nash, DO;  Location: [facility];  Service: Pulmonary;;   LARYNGECTOMY     Porta cath removal     PORTACATH PLACEMENT  09/17/10   Tip in cavoatrial junction   RE-EXCISION OF BREAST CANCER,SUPERIOR MARGINS Right 07/29/2018   Procedure: RE-EXCISION OF RIGHT BREAST MEDIAL MARGINS;  Surgeon: Rolm Bookbinder, MD;  Location: [facility];  Service: General;  Laterality: Right;   RIGID ESOPHAGOSCOPY N/A 03/19/2019   Procedure: FLEXIBLE ESOPHAGOSCOPY;  Surgeon: Jodi Marble, MD;  Location: [facility];  Service: ENT;  Laterality: N/A;   STOMAPLASTY N/A 10/21/2016   Procedure: Zola Button;  Surgeon: [name], MD;  Location: [facility];  Service: ENT;  Laterality: N/A;   TOTAL KNEE ARTHROPLASTY Right 12/12/2019   Procedure: RIGHT TOTAL KNEE ARTHROPLASTY;  Surgeon: Leandrew Koyanagi, MD;  Location: [facility];  Service: Orthopedics;  Laterality: Right;    TOTAL KNEE ARTHROPLASTY Left 04/01/2021   Procedure: LEFT TOTAL KNEE ARTHROPLASTY;  Surgeon: Leandrew Koyanagi, MD;  Location: [facility];  Service: Orthopedics;  Laterality: Left;   TRACHEAL DILITATION  07/16/2011   Procedure: TRACHEAL DILITATION;  Surgeon: Anabel Bene  Constance Holster, MD;  Location: [facility];  Service: ENT;  Laterality: N/A;  dilation of tracheal stoma and replacement of stoma tube   [place] Bilateral 05/11/2021   Procedure: VIDEO BRONCHOSCOPY WITHOUT FLUORO;  Surgeon: Garner Nash, DO;  Location: [facility];  Service: Pulmonary;  Laterality: Bilateral;  w/ cryotherapy, Foreign body extraction   Social History   Occupational History   Occupation: part time    Comment: warehouse work  Tobacco Use   Smoking status: Former    Packs/day: 2.00    Years: 20.00    Total pack years: 40.00    Types: Cigarettes    Quit date: 09/17/2010    Years since quitting: 11.2   Smokeless tobacco: Never  Vaping Use   Vaping Use: Never used  Substance and Sexual Activity   Alcohol use: Yes    Comment: Socially drinks    Drug use: No    Comment: tried cocaine 1 time 2010 only used 1 time   Sexual activity: Yes    Birth control/protection: Surgical

## 2022-01-16 ENCOUNTER — Ambulatory Visit: Payer: Medicaid Other | Admitting: Pulmonary Disease

## 2022-01-21 ENCOUNTER — Other Ambulatory Visit: Payer: Self-pay | Admitting: Family Medicine

## 2022-01-21 ENCOUNTER — Encounter: Payer: Self-pay | Admitting: Pulmonary Disease

## 2022-01-21 ENCOUNTER — Other Ambulatory Visit: Payer: Self-pay | Admitting: Pulmonary Disease

## 2022-01-21 ENCOUNTER — Ambulatory Visit (INDEPENDENT_AMBULATORY_CARE_PROVIDER_SITE_OTHER): Payer: Medicaid Other | Admitting: Pulmonary Disease

## 2022-01-21 VITALS — BP 124/72 | HR 92 | Ht 67.0 in | Wt 226.0 lb

## 2022-01-21 DIAGNOSIS — R0982 Postnasal drip: Secondary | ICD-10-CM

## 2022-01-21 DIAGNOSIS — J4540 Moderate persistent asthma, uncomplicated: Secondary | ICD-10-CM

## 2022-01-21 DIAGNOSIS — E039 Hypothyroidism, unspecified: Secondary | ICD-10-CM

## 2022-01-21 MED ORDER — GUAIFENESIN ER 600 MG PO TB12: 1200.0000 mg | ORAL_TABLET | Freq: Two times a day (BID) | ORAL | 0 refills | Status: AC

## 2022-01-21 NOTE — Progress Notes (Signed)
Synopsis: Referred in December 2022 for Foreign Body in Bronchus by [name]  Subjective:   PATIENT ID: Deborah [name] GENDER: female DOB: 07/21/1955, MRN: [id number]  HPI  Chief Complaint  Patient presents with   Follow-up    4 mo f/u. States her breathing has improved since last visit.    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   She has been doing well since last visit and moving to an all nebulizer regimen with budesonide and duonebs. She continues to have thick mucous plugs at times though on a daily basis. She is also using hypertonic nebs twice daily.   OV 09/17/21 She went to the ER on 3/16 with chest pains that were worse with movements. She had CTA Chest done which was negative for PE. She had diffuse mosaic attenuation and persistent collapse of the left lower lobe.   She reports her allergies are acting up and she has been having more wheezing recently. She denies increased mucous production at this time.   OV 07/16/21 She called to be seen sooner due to coughing spells of recent that wake her from her sleep. She has significant coughing spell that will lead to shortness of breath and difficulty clearing mucous. She denies fevers, chills or sweats.  She was started on flonase nasal spray and ipratropium nasal spray at last visit with some improvement in her sinus congestion but she continues to have a lot of mucous. She is using dulera and spiriva inhalers along with duoneb nebulizer treatments.   The mucous is clear to whitish with occasional blood tinged streaks.   Chest x-ray from 05/17/21 shows persistent left lower lobe atelectasis.   OV 05/17/21 Patient seen by her ENT, [name] with CT scan on 11/17 showing obstructing foreign body in the left mainstem bronchus with total post-obstructive atelectasis of lower lung. Appointment setup for today for further  evaluation but she became symptomatic and presented to the ED on 11/26. She underwent bronchoscopy with foreign body retrieval with Dr. Valeta Harms on 11/26. 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 She recovered well post-procedure and was then sent home from the ED.   She was seen yesterday by [name] with reported improvement in her breathing but continuing to have cough with mucous production.   She has been doing well since her hospital visit.  She continues to have cough with sputum production.  She also complains of significant postnasal drainage, more so than normal.  She denies any issues with aspiration.  She is being scheduled for esophagram and dilation.  Past Medical History:  Diagnosis Date   Anemia    h/o of   Anxiety    Arthritis    in knees   Asthma    Breast cancer ([place])    Bronchitis    COPD (chronic obstructive pulmonary disease) ([place])    Diverticulosis 11/15/2012   noted on screening colonoscopy    Dyspnea    with COPD exerbation   Esophageal stricture    Former smoker 03/19/2011   GERD (gastroesophageal reflux disease)    Heart murmur    asymptomatic    History of laryngectomy    History of radiation therapy 11/15/18- 12/06/18   Right Breast total dose 42.56 Gy in 16 fractions.    Hx of radiation therapy 09/03/10 to 10/16/2010   supraglottic larynx   Hypertension    Hypothyroid  due to radiation   Internal hemorrhoid 11/15/2012   small, noted on screening colonoscopy    Larynx cancer ([place]) 07/31/2010   supraglotttic s/p chemo/radiation and surgical rescection.   Leukocytopenia    Nausea alone 07/28/2013   Neck pain 01/21/2012   Normal MRI 07/14/2011   negative for mestasis    Pneumonia 2012   Sciatica    Seizures ([place])    07/24/11 off Effexor w/o seizure   Sepsis ([place]) 08/04/2012   Sinusitis, chronic 07/20/2011   Bilateral maxillary, identified on MRI of head 07/14/11.     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[employer])      Family History  Problem Relation  Age of Onset   Heart disease Mother    Heart disease Father    Heart disease Sister    Cancer Brother        type unknown   Liver disease Maternal Grandmother    Cancer Sister        type unknown   Diabetes Sister    Breast cancer Neg Hx      Social History   Socioeconomic History   Marital status: Divorced    Spouse name: Not on file   Number of children: 2   Years of education: 12   Highest education level: Not on file  Occupational History   Occupation: part time    Comment: warehouse work  Tobacco Use   Smoking status: Former    Packs/day: 2.00    Years: 20.00    Total pack years: 40.00    Types: Cigarettes    Quit date: 09/17/2010    Years since quitting: 11.3   Smokeless tobacco: Never  Vaping Use   Vaping Use: Never used  Substance and Sexual Activity   Alcohol use: Yes    Comment: Socially drinks    Drug use: No    Comment: tried cocaine 1 time 2010 only used 1 time   Sexual activity: Yes    Birth control/protection: Surgical  Other Topics Concern   Not on file  Social History Narrative   Lives with boyfriend   Social Determinants of Health   Financial Resource Strain: Not on file  Food Insecurity: Not on file  Transportation Needs: No Transportation Needs (08/17/2018)   PRAPARE - Hydrologist (Medical): No    Lack of Transportation (Non-Medical): No  Physical Activity: Not on file  Stress: Not on file  Social Connections: Not on file  Intimate Partner Violence: Not At Risk (08/17/2018)   Humiliation, Afraid, Rape, and Kick questionnaire    Fear of Current or Ex-Partner: No    Emotionally Abused: No    Physically Abused: No    Sexually Abused: No     Allergies  Allergen Reactions   Lisinopril Cough     Outpatient Medications Prior to Visit  Medication Sig Dispense Refill   acetaminophen (TYLENOL) 500 MG tablet Take 1,000 mg by mouth every 6 (six) hours as needed for moderate pain or headache.     albuterol (PROAIR  HFA) 108 (90 Base) MCG/ACT inhaler Inhale 2 puffs into the lungs every 4 (four) hours as needed for wheezing or shortness of breath. 18 g 11   albuterol (PROVENTIL) (5 MG/ML) 0.5% nebulizer solution Take 0.5 mLs (2.5 mg total) by nebulization every 6 (six) hours as needed for wheezing or shortness of breath. Please give the patient 10 41m vials. 200 mL 5   amLODipine (NORVASC) 10 MG tablet Take 1 tablet by mouth daily (  Patient taking differently: Take 10 mg by mouth every morning.) 90 tablet 3   anastrozole (ARIMIDEX) 1 MG tablet Take 1 tablet (1 mg total) by mouth daily. 90 tablet 3   budesonide (PULMICORT) 0.5 MG/2ML nebulizer solution Take 2 mLs (0.5 mg total) by nebulization 2 (two) times daily. 120 mL 11   EQ ALLERGY RELIEF, CETIRIZINE, 10 MG tablet Take 1 tablet by mouth once daily 90 tablet 0   escitalopram (LEXAPRO) 20 MG tablet Take 1 tablet (20 mg total) by mouth daily. 30 tablet 2   fluticasone (FLONASE) 50 MCG/ACT nasal spray Use 1 spray(s) in each nostril once daily 16 g 2   Humidifiers MISC 1 application by Tracheal Tube route as needed. 1 each 0   ipratropium (ATROVENT) 0.03 % nasal spray Place 2 sprays into both nostrils every 12 (twelve) hours. 30 mL 12   ipratropium-albuterol (DUONEB) 0.5-2.5 (3) MG/3ML SOLN Take 3 mLs by nebulization every 6 (six) hours. 360 mL 3   levothyroxine (SYNTHROID) 200 MCG tablet Take 1 tablet (200 mcg total) by mouth daily before breakfast. 90 tablet 3   levothyroxine (SYNTHROID) 50 MCG tablet Take 1/2 tablet 50 mcg synthroid with 200 mcg synthroid tablet. 90 tablet 0   metoprolol tartrate (LOPRESSOR) 50 MG tablet Take 1 tablet (50 mg total) by mouth 2 (two) times daily. 180 tablet 3   montelukast (SINGULAIR) 10 MG tablet Take 1 tablet (10 mg total) by mouth at bedtime. 30 tablet 11   nystatin cream (MYCOSTATIN) Apply 1 application topically daily as needed for dry skin. 30 g 1   omeprazole (PRILOSEC) 40 MG capsule Take 1 capsule by mouth twice daily  (Patient taking differently: Take 40 mg by mouth in the morning and at bedtime.) 90 capsule 0   ondansetron (ZOFRAN) 4 MG tablet Take 1 tablet (4 mg total) by mouth every 8 (eight) hours as needed for nausea or vomiting. 40 tablet 0   sodium chloride HYPERTONIC 3 % nebulizer solution Take by nebulization 3 (three) times daily. 120 mL 5   losartan-hydrochlorothiazide (HYZAAR) 100-25 MG tablet Take 1 tablet by mouth daily. 30 tablet 0   No facility-administered medications prior to visit.   Review of Systems  Constitutional:  Negative for chills, fever, malaise/fatigue and weight loss.  HENT:  Negative for congestion, sinus pain and sore throat.   Eyes: Negative.   Respiratory:  Positive for cough and sputum production. Negative for hemoptysis, shortness of breath and wheezing.   Cardiovascular:  Negative for chest pain, palpitations, orthopnea, claudication and leg swelling.  Gastrointestinal:  Negative for abdominal pain, heartburn, nausea and vomiting.  Genitourinary: Negative.   Musculoskeletal:  Negative for joint pain and myalgias.  Skin:  Negative for rash.  Neurological:  Negative for weakness.  Endo/Heme/Allergies: Negative.   Psychiatric/Behavioral: Negative.      Objective:   Vitals:   01/21/22 1521  BP: 124/72  Pulse: 92  SpO2: 97%  Weight: 226 lb (102.5 kg)  Height: '5\' 7"'$  (1.702 m)    Physical Exam Constitutional:      General: She is not in acute distress.    Appearance: She is not ill-appearing.  HENT:     Head: Normocephalic and atraumatic.     Mouth/Throat:     Comments: Trachesotomy in place, no erythema or secretions noted Cardiovascular:     Rate and Rhythm: Normal rate and regular rhythm.     Pulses: Normal pulses.     Heart sounds: Normal heart sounds. No murmur heard.  Pulmonary:     Effort: Pulmonary effort is normal.     Breath sounds: Normal breath sounds. No wheezing, rhonchi or rales.  Musculoskeletal:     Right lower leg: No edema.     Left  lower leg: No edema.  Skin:    General: Skin is warm and dry.  Neurological:     General: No focal deficit present.     Mental Status: She is alert.  Psychiatric:        Mood and Affect: Mood normal.        Behavior: Behavior normal.        Thought Content: Thought content normal.        Judgment: Judgment normal.     CBC    Component Value Date/Time   WBC 5.2 08/29/2021 0505   RBC 4.08 08/29/2021 0505   HGB 12.3 08/29/2021 0505   HGB 11.7 (L) 06/02/2018 1208   HGB 11.9 08/20/2015 1200   HCT 38.1 08/29/2021 0505   HCT 37.3 08/20/2015 1200   PLT 318 08/29/2021 0505   PLT 359 06/02/2018 1208   PLT 305 08/20/2015 1200   MCV 93.4 08/29/2021 0505   MCV 91.8 08/20/2015 1200   MCH 30.1 08/29/2021 0505   MCHC 32.3 08/29/2021 0505   RDW 13.8 08/29/2021 0505   RDW 14.4 08/20/2015 1200   LYMPHSABS 1.3 08/29/2021 0505   LYMPHSABS 1.2 08/20/2015 1200   MONOABS 0.4 08/29/2021 0505   MONOABS 0.3 08/20/2015 1200   EOSABS 0.2 08/29/2021 0505   EOSABS 0.2 08/20/2015 1200   BASOSABS 0.1 08/29/2021 0505   BASOSABS 0.1 08/20/2015 1200      Latest Ref Rng & Units 10/14/2021    2:40 PM 09/17/2021    9:27 AM 08/29/2021    5:05 AM  BMP  Glucose 70 - 99 mg/dL 100  171  147   BUN 8 - 27 mg/dL '31  17  13   '$ Creatinine 0.57 - 1.00 mg/dL 1.45  1.01  0.93   BUN/Creat Ratio 12 - '28 21  17    '$ Sodium 134 - 144 mmol/L 135  139  138   Potassium 3.5 - 5.2 mmol/L 4.0  3.8  3.6   Chloride 96 - 106 mmol/L 96  103  99   CO2 20 - 29 mmol/L '19  17  26   '$ Calcium 8.7 - 10.3 mg/dL 9.9  10.0  8.9    Chest imaging: CTA Chest 08/29/21 1. No evidence for acute pulmonary embolism. 2. Persistent complete atelectasis of the left lower lobe with hyperexpansion of the left upper lobe. Central occlusion of the left lower lobe bronchus is unchanged from previous exam. Residual, retained foreign body or endobronchial lesion not excluded. Advise referral to pulmonary medicine for further management. 3. Diffuse  mosaic attenuation pattern throughout both lungs likely reflects air trapping secondary to small airways disease. 4. Hepatic steatosis. Contour the liver appears slightly irregular which may reflect early cirrhosis. 5. Unchanged appearance of borderline enlarged left axillary lymph node.  CXR 05/17/21 Persistent complete atelectasis of the left lower lobe. Lung volumes are normal. No consolidative airspace disease. No pleural effusions. No pneumothorax. No pulmonary nodule or mass noted. Pulmonary vasculature and the cardiomediastinal silhouette are within normal limits.  CT Chest 05/02/21 1. There is an obstructing foreign body at the origin of the left mainstem bronchus, with total postobstructive atelectasis of the left lower lobe. This may be an aspirated tracheoesophageal speech valve. 2. Tracheostomy. 3. Hepatic steatosis.  PFT:  No data to display          Labs:  Path:  Echo 08/31/17: LVEF 65 to 70%.  Grade 2 diastolic dysfunction.  Normal size and function.  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  She has asthma/COPD that has been adquately managed with budesonide 0.'5mg'$  twice daily and duonebs q6hrs.   She is to use hypertonic saline nebs for airway clearance as needed for chest congestion and mucous plugs. Will try her on 14 days of mucinex 1,'200mg'$  for the issues with mucous plugs.   She is to continue zyrtec daily for allergies and continue montelukast '10mg'$  daily.   She is to continue fluticasone nasal spray daily and ipratropium nasal spray twice daily for her postnasal drainage.  Follow-up in 6 months.  Freda Jackson, MD [facility] Office:  [phone]    Current Outpatient Medications:    acetaminophen (TYLENOL) 500 MG tablet, Take 1,000 mg by mouth every 6 (six) hours as needed for moderate pain or headache., Disp: , Rfl:    albuterol (PROAIR HFA) 108 (90 Base) MCG/ACT inhaler, Inhale 2 puffs into the lungs every 4 (four) hours as needed for wheezing or shortness of breath., Disp: 18 g, Rfl: 11   albuterol (PROVENTIL) (5 MG/ML) 0.5% nebulizer solution, Take 0.5 mLs (2.5 mg total) by nebulization every 6 (six) hours as needed for wheezing or shortness of breath. Please give the patient 10 24m vials., Disp: 200 mL, Rfl: 5   amLODipine (NORVASC) 10 MG tablet, Take 1 tablet by mouth daily (Patient taking differently: Take 10 mg by mouth every morning.), Disp: 90 tablet, Rfl: 3   anastrozole (ARIMIDEX) 1 MG tablet, Take 1 tablet (1 mg total) by mouth daily., Disp: 90 tablet, Rfl: 3   budesonide (PULMICORT) 0.5 MG/2ML nebulizer solution, Take 2 mLs (0.5 mg total) by nebulization 2 (two) times daily., Disp: 120 mL, Rfl: 11   EQ ALLERGY RELIEF, CETIRIZINE, 10 MG tablet, Take 1 tablet by mouth once daily, Disp: 90 tablet, Rfl: 0   escitalopram (LEXAPRO) 20 MG tablet, Take 1 tablet (20 mg total) by mouth daily., Disp: 30 tablet, Rfl: 2   fluticasone (FLONASE) 50 MCG/ACT nasal spray, Use 1 spray(s) in each nostril once daily, Disp: 16 g, Rfl: 2   guaiFENesin (MUCINEX) 600 MG 12 hr tablet, Take 2 tablets (1,200 mg total) by mouth 2 (two) times daily for 14 days., Disp: 56 tablet, Rfl: 0   Humidifiers MISC, 1 application by Tracheal Tube route as needed., Disp: 1 each, Rfl: 0   ipratropium (ATROVENT) 0.03 % nasal spray, Place 2 sprays into both nostrils every 12 (twelve) hours., Disp: 30 mL, Rfl: 12   ipratropium-albuterol (DUONEB) 0.5-2.5 (3) MG/3ML SOLN, Take 3 mLs by nebulization every 6 (six) hours., Disp: 360 mL, Rfl: 3   levothyroxine (SYNTHROID) 200 MCG tablet, Take 1 tablet (200 mcg total) by mouth daily before breakfast., Disp: 90  tablet, Rfl: 3   levothyroxine (SYNTHROID) 50 MCG tablet, Take 1/2 tablet 50 mcg synthroid with 200 mcg synthroid tablet., Disp: 90 tablet, Rfl: 0   metoprolol tartrate (LOPRESSOR) 50 MG tablet, Take 1 tablet (50 mg total) by mouth 2 (two) times daily., Disp: 180 tablet, Rfl: 3   montelukast (SINGULAIR) 10  MG tablet, Take 1 tablet (10 mg total) by mouth at bedtime., Disp: 30 tablet, Rfl: 11   nystatin cream (MYCOSTATIN), Apply 1 application topically daily as needed for dry skin., Disp: 30 g, Rfl: 1   omeprazole (PRILOSEC) 40 MG capsule, Take 1 capsule by mouth twice daily (Patient taking differently: Take 40 mg by mouth in the morning and at bedtime.), Disp: 90 capsule, Rfl: 0   ondansetron (ZOFRAN) 4 MG tablet, Take 1 tablet (4 mg total) by mouth every 8 (eight) hours as needed for nausea or vomiting., Disp: 40 tablet, Rfl: 0   sodium chloride HYPERTONIC 3 % nebulizer solution, Take by nebulization 3 (three) times daily., Disp: 120 mL, Rfl: 5   losartan-hydrochlorothiazide (HYZAAR) 100-25 MG tablet, Take 1 tablet by mouth daily., Disp: 30 tablet, Rfl: 0

## 2022-01-21 NOTE — Patient Instructions (Addendum)
Continue to use duoneb nebulizer treatment every 4-6 hours during the day   Continue budesonide neblizer treatment twice daily   Continue montelukast '10mg'$  daily for allergies   Continue using the hypertonic saline nebulizer treatments (The [facility] neb treatment).   Try mucinex 1,'200mg'$  twice daily for 2 weeks and monitor for improvement in your mucous plugs   Continue on zyrtec daily for allergies   Continue on nasal sprays   Follow up in 6 months.

## 2022-02-11 ENCOUNTER — Other Ambulatory Visit: Payer: Self-pay | Admitting: Hematology and Oncology

## 2022-02-11 ENCOUNTER — Other Ambulatory Visit: Payer: Self-pay | Admitting: Family Medicine

## 2022-02-11 ENCOUNTER — Other Ambulatory Visit: Payer: Self-pay

## 2022-02-11 DIAGNOSIS — I10 Essential (primary) hypertension: Secondary | ICD-10-CM

## 2022-02-11 DIAGNOSIS — K222 Esophageal obstruction: Secondary | ICD-10-CM | POA: Diagnosis not present

## 2022-02-11 DIAGNOSIS — Z923 Personal history of irradiation: Secondary | ICD-10-CM | POA: Diagnosis not present

## 2022-02-11 DIAGNOSIS — E039 Hypothyroidism, unspecified: Secondary | ICD-10-CM

## 2022-02-12 MED ORDER — METOPROLOL TARTRATE 50 MG PO TABS: 50.0000 mg | ORAL_TABLET | Freq: Two times a day (BID) | ORAL | 1 refills | Status: DC

## 2022-02-12 MED ORDER — AMLODIPINE BESYLATE 10 MG PO TABS: 10.0000 mg | ORAL_TABLET | Freq: Every day | ORAL | 1 refills | Status: DC

## 2022-02-13 ENCOUNTER — Other Ambulatory Visit: Payer: Self-pay

## 2022-02-13 ENCOUNTER — Encounter (HOSPITAL_COMMUNITY): Payer: Self-pay | Admitting: Otolaryngology

## 2022-02-13 NOTE — Anesthesia Preprocedure Evaluation (Addendum)
Anesthesia Evaluation    Reviewed: Allergy & Precautions, Patient's Chart, lab work & pertinent test results  Airway Mallampati: IV  TM Distance: >3 FB Neck ROM: Full    Dental  (+) Edentulous Upper, Edentulous Lower   Pulmonary asthma , COPD,  COPD inhaler, former smoker,    Pulmonary exam normal breath sounds clear to auscultation       Cardiovascular hypertension, Pt. on medications Normal cardiovascular exam Rhythm:Regular Rate:Normal     Neuro/Psych Seizures -,  PSYCHIATRIC DISORDERS Anxiety Depression    GI/Hepatic Neg liver ROS, GERD  Controlled,  Endo/Other  negative endocrine ROS  Renal/GU negative Renal ROS  negative genitourinary   Musculoskeletal  (+) Arthritis , Osteoarthritis,    Abdominal   Peds  Hematology negative hematology ROS (+)   Anesthesia Other Findings   Reproductive/Obstetrics negative OB ROS                            Anesthesia Physical Anesthesia Plan  ASA: 2  Anesthesia Plan: General   Post-op Pain Management:    Induction:   PONV Risk Score and Plan: 2 and Propofol infusion and TIVA  Airway Management Planned: Natural Airway and Simple Face Mask  Additional Equipment: None  Intra-op Plan:   Post-operative Plan:   Informed Consent: I have reviewed the patients History and Physical, chart, labs and discussed the procedure including the risks, benefits and alternatives for the proposed anesthesia with the patient or authorized representative who has indicated his/her understanding and acceptance.       Plan Discussed with: CRNA  Anesthesia Plan Comments: (PAT note written 02/13/2022 by Myra Gianotti, PA-C. )       Anesthesia Quick Evaluation

## 2022-02-13 NOTE — Progress Notes (Signed)
Anesthesia Chart Review: Deborah [name]   Case: [id number] Date/Time: 02/14/22 1116   Procedure: ESOPHAGOSCOPY WITH DILATATION   Anesthesia type: General   Pre-op diagnosis: Esophageal stricture   Location: [facility] OR ROOM 09 / [place] [facility]   Surgeons: [name], MD       DISCUSSION:  Patient is a 66 year old female scheduled for the above procedure.    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    Followed by pulmonologist Dr. Erin Fulling for asthma/COPD "adequately managed with budesonide 0.60m twice daily and duonebs q6hrs". Using hypertonic saline nebs for airway clearance as needed for chest congestion and mucous plugs. Course of Mucinex added at 01/21/22 visit. On Zyrtec, montelukast, fluticasone, ipratropium nasal spray for allergies, postnasal drainage. Six month follow-up planned.    * Patient has trach and tracheoesophageal puncture (TEP) prosthesis which is a small plastic or silicone valve that fits into the opening. The valve keeps food out of the trachea. Patients can can cover their stoma with a finger, and force air into the esophagus through the valve. Her particular TEP prosthesis is changed out by speech therapy (ST) or ENT. It has become dislodged on at least three occasions, reportedly during significant coughing spells and/or during insertion.   Last TEP evaluation by ST was on 12/20/21 ([facility]: "Patient  removed Larytube to reveal 17 Fr 6 mm Provox Vega TEP prosthesis in situ. She reports leaking through. Original in situ TEP prosthesis was removed without difficulty and tracheoesophagel fistula was stented with 18 French dilator. SLP inserted 17 Fr 6 mm Provox Vega TEP prosthesis using gel cap method. Prosthesis demonstrated good fit at posterior tracheal wall and good rotation within fistula tract. No leaks were noted post-placement..She is independent with larytube placement task. Patient continued to demonstrate intact TEP speech using finger occlusion of Larytube." Per notes: Prosthesis Brand: Atos Prosthesis Type: Atos, Indwelling, Vega Prosthesis Size: 17 FR Prosthesis Length: 6 Dilation Method: 18 FR Dilator   Previous airway for anesthesia via tracheostomy. [facility].  Anesthesiologist to evaluate on the day of surgery.    VS:  BP Readings from Last 3 Encounters:  01/21/22 124/72  10/30/21 125/80  10/18/21 126/82   Pulse Readings from Last 3 Encounters:  01/21/22 92  10/30/21 86  10/18/21 85     PROVIDERS: SZola Button MD is PCP  - RIzora Gala MD is ENT ([place]- GNicholas Lose MD is HEM-ONC - SEppie Gibson MD is RAD-ONC - DFreda Jackson MD is pulmonologist. Previously she saw WChristinia Gully MD.  -[name] MD is cardiologist. Seen by BLeanor Kail PSpencer4/18/19 for dyspnea evaluation. PRN f/u recommended after testing (see below). -Wray Kearns MD is GI   LABS: For day of surgery as indicated. [facility] Results  Component Value Date   WBC 5.2 08/29/2021   HGB 12.3 08/29/2021   HCT 38.1 08/29/2021   PLT 318 08/29/2021   GLUCOSE 100 (H) 10/14/2021   ALT 17 05/11/2021  AST 23 05/11/2021   NA 135 10/14/2021   K 4.0 10/14/2021   CL 96 10/14/2021   CREATININE 1.45 (H) 10/14/2021   BUN 31 (H) 10/14/2021   CO2 19 (L) 10/14/2021   TSH  24.800 (H) 10/14/2021   INR 1.0 03/28/2021   HGBA1C 5.5 12/11/2020     OTHER: Walk Tests: 08/21/17   Walked RA x one lap @ 185 stopped due to  Sob with nl sats / rapid pace  09/18/17   Walked RA  2 laps @ 185 ft each stopped due to  Sob / no desats / slow pace    IMAGES: CTA Chest 08/29/21: IMPRESSION: 1. No evidence for acute pulmonary embolism. 2. Persistent complete atelectasis of the left lower lobe with hyperexpansion of the left upper lobe. Central occlusion of the left lower lobe bronchus is unchanged from previous exam. Residual, retained foreign body or endobronchial lesion not excluded. Advise referral to pulmonary medicine for further management. 3. Diffuse mosaic attenuation pattern throughout both lungs likely reflects air trapping secondary to small airways disease. 4. Hepatic steatosis. Contour the liver appears slightly irregular which may reflect early cirrhosis. 5. Unchanged appearance of borderline enlarged left axillary lymph node.    EKG: 05/11/21: Normal sinus rhythm Possible Left atrial enlargement Cannot rule out Anterior infarct , age undetermined ST & T wave abnormality, consider inferolateral ischemia Abnormal ECG no significant change when compared to prior on Aug 2022 Confirmed by Madalyn Rob [phone]) on 05/11/2021 9:01:37 AM - Baseline wanderer present   CV: Nuclear stress test 10/07/17: Nuclear stress EF: 61%. There was no ST segment deviation noted during stress. Defect 1: There is a medium defect of moderate severity present in the mid anteroseptal and apex location. The study is normal. This is a low risk study. The left ventricular ejection fraction is normal (55-65%).  Normal stress nuclear study with breast attenuation but no ischemia; EF 61 with normal wall motion.   Echo 08/31/17: Study Conclusions - Left ventricle: The cavity size was mildly reduced. Wall   thickness was increased in a pattern of moderate LVH. Systolic    function was vigorous. The estimated ejection fraction was in the   range of 65% to 70%. Wall motion was normal; there were no   regional wall motion abnormalities. Features are consistent with   a pseudonormal left ventricular filling pattern, with concomitant   abnormal relaxation and increased filling pressure (grade 2   diastolic dysfunction). - Aortic valve: There was mild regurgitation. - Pulmonary arteries: Systolic pressure was mildly increased. PA   peak pressure: 35 mm Hg (S).    Past Medical History:  Diagnosis Date   Anemia    h/o of   Anxiety    Arthritis    in knees   Asthma    Breast cancer ([place])    Bronchitis    COPD (chronic obstructive pulmonary disease) ([place])    Diverticulosis 11/15/2012   noted on screening colonoscopy    Esophageal stricture    Former smoker 03/19/2011   GERD (gastroesophageal reflux disease)    Heart murmur    asymptomatic    History of laryngectomy    History of radiation therapy 11/15/18- 12/06/18   Right Breast total dose 42.56 Gy in 16 fractions.    Hx of radiation therapy 09/03/10 to 10/16/2010   supraglottic larynx   Hypertension    Hypothyroid    due to radiation   Internal hemorrhoid 11/15/2012   small, noted on screening colonoscopy  Larynx cancer ([place]) 07/31/2010   supraglotttic s/p chemo/radiation and surgical rescection.   Leukocytopenia    Nausea alone 07/28/2013   Neck pain 01/21/2012   Normal MRI 07/14/2011   negative for mestasis    Pneumonia 2012   Sciatica    Seizures ([place])    07/24/11 off Effexor w/o seizure   Sepsis ([place]) 08/04/2012   Sinusitis, chronic 07/20/2011   Bilateral maxillary, identified on MRI of head 07/14/11.     Tracheostomy dependent [facility])     Past Surgical History:  Procedure Laterality Date   BREAST LUMPECTOMY Right 07/06/2018   BREAST LUMPECTOMY WITH RADIOACTIVE SEED LOCALIZATION Right 07/06/2018   Procedure: RIGHT BREAST LUMPECTOMY WITH RADIOACTIVE SEED LOCALIZATION;  Surgeon: Rolm Bookbinder, MD;  Location: [place];  Service: General;  Laterality: Right;   COLONOSCOPY N/A 11/15/2012   Procedure: COLONOSCOPY;  Surgeon: Lafayette Dragon, MD;  [facility];  Service: Endoscopy;  Laterality: N/A;   CRYOTHERAPY  05/11/2021   Procedure: CRYOTHERAPY;  Surgeon: Garner Nash, DO;  Location: [place] ENDOSCOPY;  Service: Pulmonary;;   DENTAL RESTORATION/EXTRACTION WITH X-RAY     ESOPHAGEAL DILATION N/A 09/09/2019   Procedure: ESOPHAGEAL DILATION;  Surgeon: [name], MD;  Location: [place];  Service: ENT;  Laterality: N/A;   ESOPHAGEAL DILATION N/A 10/05/2019   Procedure: ESOPHAGEAL DILATION;  Surgeon: [name], MD;  Location: [place];  Service: ENT;  Laterality: N/A;  via tracheostomy   ESOPHAGEAL DILATION N/A 02/27/2020   Procedure: ESOPHAGEAL DILATION;  Surgeon: [name], MD;  Location: [place];  Service: ENT;  Laterality: N/A;   ESOPHAGOGASTRODUODENOSCOPY (EGD) WITH PROPOFOL N/A 08/12/2018   Procedure: ESOPHAGOGASTRODUODENOSCOPY (EGD) WITH PROPOFOL;  Surgeon: Doran Stabler, MD;  Location: [place];  Service: Gastroenterology;  Laterality: N/A;   ESOPHAGOSCOPY  06/21/2012   Procedure: ESOPHAGOSCOPY;  Surgeon: [name], MD;  Location: [place];  Service: ENT;  Laterality: N/A;   ESOPHAGOSCOPY N/A 06/24/2021   Procedure: ESOPHAGOSCOPY WITH DILATATION;  Surgeon: [name], MD;  Location: [place];  Service: ENT;  Laterality: N/A;   ESOPHAGOSCOPY WITH DILITATION N/A 09/21/2014   Procedure: ESOPHAGOSCOPY WITH DILITATION;  Surgeon: [name], MD;  Location: [place];  Service: ENT;  Laterality: N/A;   ESOPHAGOSCOPY WITH DILITATION N/A 07/04/2016   Procedure: ESOPHAGOSCOPY WITH DILITATION;  Surgeon: [name], MD;  Location: [place];  Service: ENT;  Laterality: N/A;   ESOPHAGOSCOPY WITH DILITATION N/A 12/01/2017   Procedure: ESOPHAGOSCOPY WITH DILITATION;  Surgeon: [name], MD;   Location: [place];  Service: ENT;  Laterality: N/A;   ESOPHAGOSCOPY WITH DILITATION N/A 04/19/2018   Procedure: ESOPHAGOSCOPY WITH DILITATION;  Surgeon: [name], MD;  Location: [place];  Service: ENT;  Laterality: N/A;   ESOPHAGOSCOPY WITH DILITATION N/A 03/07/2019   Procedure: Esophagoscopy with dilatation;  Surgeon: [name], MD;  Location: [place];  Service: ENT;  Laterality: N/A;   ESOPHAGOSCOPY WITH DILITATION N/A 07/16/2020   Procedure: ESOPHAGOSCOPY WITH DILATION;  Surgeon: [name], MD;  Location: [place];  Service: ENT;  Laterality: N/A;   [place] N/A 10/08/2020   Procedure: ESOPHAGOSCOPY With Dilation;  Surgeon: [name], MD;  Location: [place];  Service: ENT;  Laterality: N/A;  21 French to 22 french   [place]  01/08/2018       FOREIGN BODY REMOVAL  05/11/2021   Procedure: FOREIGN BODY REMOVAL;  Surgeon: Valeta Harms,  Octavio Graves, DO;  Location: [place] ENDOSCOPY;  Service: Pulmonary;;   FOREIGN BODY REMOVAL BRONCHIAL  10/02/2011   Procedure: REMOVAL FOREIGN BODY BRONCHIAL;  Surgeon: Ruby Cola, MD;  Location[facility] OR;  Service: ENT;  Laterality: N/A;   FOREIGN BODY REMOVAL BRONCHIAL N/A 01/08/2018   Procedure: REMOVAL FOREIGN BODY BRONCHIAL;  Surgeon: Jodi Marble, MD;  [facility];  Service: ENT;  Laterality: N/A;   HEMOSTASIS CONTROL  05/11/2021   Procedure: HEMOSTASIS CONTROL;  Surgeon: Garner Nash, DO;  Location: [place];  Service: Pulmonary;;   LARYNGECTOMY     Porta cath removal     PORTACATH PLACEMENT  09/17/10   Tip in cavoatrial junction   RE-EXCISION OF BREAST CANCER,SUPERIOR MARGINS Right 07/29/2018   Procedure: RE-EXCISION OF RIGHT BREAST MEDIAL MARGINS;  Surgeon: Rolm Bookbinder, MD;  Location: [place];  Service: General;  Laterality: Right;   RIGID ESOPHAGOSCOPY N/A 03/19/2019   Procedure: FLEXIBLE ESOPHAGOSCOPY;  Surgeon: Jodi Marble, MD;  Location: [place];   Service: ENT;  Laterality: N/A;   STOMAPLASTY N/A 10/21/2016   Procedure: Zola Button;  Surgeon: [name], MD;  Location: [place];  Service: ENT;  Laterality: N/A;   TOTAL KNEE ARTHROPLASTY Right 12/12/2019   Procedure: RIGHT TOTAL KNEE ARTHROPLASTY;  Surgeon: Leandrew Koyanagi, MD;  Location: [place];  Service: Orthopedics;  Laterality: Right;   TOTAL KNEE ARTHROPLASTY Left 04/01/2021   Procedure: LEFT TOTAL KNEE ARTHROPLASTY;  Surgeon: Leandrew Koyanagi, MD;  Location: [place];  Service: Orthopedics;  Laterality: Left;   TRACHEAL DILITATION  07/16/2011   Procedure: TRACHEAL DILITATION;  Surgeon: Beckie Salts, MD;  Location: [place];  Service: ENT;  Laterality: N/A;  dilation of tracheal stoma and replacement of stoma tube   [place] Bilateral 05/11/2021   Procedure: VIDEO BRONCHOSCOPY WITHOUT FLUORO;  Surgeon: Garner Nash, DO;  Location: [place];  Service: Pulmonary;  Laterality: Bilateral;  w/ cryotherapy, Foreign body extraction    MEDICATIONS: No current facility-administered medications for this encounter.    albuterol (PROVENTIL) (5 MG/ML) 0.5% nebulizer solution   anastrozole (ARIMIDEX) 1 MG tablet   budesonide (PULMICORT) 0.5 MG/2ML nebulizer solution   EQ ALLERGY RELIEF, CETIRIZINE, 10 MG tablet   escitalopram (LEXAPRO) 20 MG tablet   EUTHYROX 200 MCG tablet   fluticasone (FLONASE) 50 MCG/ACT nasal spray   ipratropium (ATROVENT) 0.03 % nasal spray   ipratropium-albuterol (DUONEB) 0.5-2.5 (3) MG/3ML SOLN   levothyroxine (SYNTHROID) 50 MCG tablet   losartan-hydrochlorothiazide (HYZAAR) 100-25 MG tablet   montelukast (SINGULAIR) 10 MG tablet   nystatin cream (MYCOSTATIN)   omeprazole (PRILOSEC) 40 MG capsule   sodium chloride HYPERTONIC 3 % nebulizer solution   amLODipine (NORVASC) 10 MG tablet   Humidifiers MISC   metoprolol tartrate (LOPRESSOR) 50 MG tablet    Myra Gianotti, PA-C Surgical Short Stay/Anesthesiology [facility] Phone [phone] [facility] Phone [phone] 02/13/2022 1:22 PM

## 2022-02-13 NOTE — Progress Notes (Addendum)
Anesthesia Review:  PCP: Naaman Plummer aut[facility] LOV 10/30/21  Cardiologist :none Pulmonology- DR Erin Fulling- LOV 01/21/22  Chest x-ray : CT Angiochest- 09/27/21  EKG : 05/20/21  Echo : 2019  Stress test: 2019  Cardiac Cath :  Activity level: can do a flight of stairs without difficutly  Sleep Study/ CPAP :none  Fasting Blood Sugar :      / Checks Blood Sugar -- times a day:   Blood Thinner/ Instructions /Last Dose: ASA / Instructions/ Last Dose :   Spoke with Pearl at office of DR Elie Goody and requested orders.  She stated DR Constance Holster will put his orders in.   Called and LVMM x 2 for pt to call preop nurse for preop instructoins and to review med hx on 02/13/22.  Also called and spoke with husband and he gave me pt's phone number that was already listed.   PT returned call.  Preop med hx and preop instructions completed.  PT voiced understanding   Pt has tracheostomy.   Secure chart sent to Myra Gianotti in regards to pt status.

## 2022-02-13 NOTE — H&P (Signed)
Deborah [name] is an 66 y.o. female.   Chief Complaint: dysphagia HPI: S/P total laryngectomy and XRT, years ago, with esophageal stricture.  Past Medical History:  Diagnosis Date   Anemia    h/o of   Anxiety    Arthritis    in knees   Asthma    Breast cancer ([place])    Bronchitis    COPD (chronic obstructive pulmonary disease) ([place])    Diverticulosis 11/15/2012   noted on screening colonoscopy    Dyspnea    with COPD exerbation   Esophageal stricture    Former smoker 03/19/2011   GERD (gastroesophageal reflux disease)    Heart murmur    asymptomatic    History of laryngectomy    History of radiation therapy 11/15/18- 12/06/18   Right Breast total dose 42.56 Gy in 16 fractions.    Hx of radiation therapy 09/03/10 to 10/16/2010   supraglottic larynx   Hypertension    Hypothyroid    due to radiation   Internal hemorrhoid 11/15/2012   small, noted on screening colonoscopy    Larynx cancer ([place]) 07/31/2010   supraglotttic s/p chemo/radiation and surgical rescection.   Leukocytopenia    Nausea alone 07/28/2013   Neck pain 01/21/2012   Normal MRI 07/14/2011   negative for mestasis    Pneumonia 2012   Sciatica    Seizures ([place])    07/24/11 off Effexor w/o seizure   Sepsis ([place]) 08/04/2012   Sinusitis, chronic 07/20/2011   Bilateral maxillary, identified on MRI of head 07/14/11.     Tracheostomy dependent [facility])     Past Surgical History:  Procedure Laterality Date   BREAST LUMPECTOMY Right 07/06/2018   BREAST LUMPECTOMY WITH RADIOACTIVE SEED LOCALIZATION Right 07/06/2018   Procedure: RIGHT BREAST LUMPECTOMY WITH RADIOACTIVE SEED LOCALIZATION;  Surgeon: Rolm Bookbinder, MD;  Location: [place];  Service: General;  Laterality: Right;   COLONOSCOPY N/A 11/15/2012   Procedure: COLONOSCOPY;  Surgeon: Lafayette Dragon, MD;  [facility];  Service: Endoscopy;  Laterality: N/A;   CRYOTHERAPY  05/11/2021   Procedure: CRYOTHERAPY;  Surgeon: Garner Nash, DO;  Location: [place]  ENDOSCOPY;  Service: Pulmonary;;   DENTAL RESTORATION/EXTRACTION WITH X-RAY     ESOPHAGEAL DILATION N/A 09/09/2019   Procedure: ESOPHAGEAL DILATION;  Surgeon: [name], MD;  Location: [place];  Service: ENT;  Laterality: N/A;   ESOPHAGEAL DILATION N/A 10/05/2019   Procedure: ESOPHAGEAL DILATION;  Surgeon: [name], MD;  Location: [place];  Service: ENT;  Laterality: N/A;  via tracheostomy   ESOPHAGEAL DILATION N/A 02/27/2020   Procedure: ESOPHAGEAL DILATION;  Surgeon: [name], MD;  Location: [place];  Service: ENT;  Laterality: N/A;   ESOPHAGOGASTRODUODENOSCOPY (EGD) WITH PROPOFOL N/A 08/12/2018   Procedure: ESOPHAGOGASTRODUODENOSCOPY (EGD) WITH PROPOFOL;  Surgeon: Doran Stabler, MD;  Location: [place];  Service: Gastroenterology;  Laterality: N/A;   ESOPHAGOSCOPY  06/21/2012   Procedure: ESOPHAGOSCOPY;  Surgeon: [name], MD;  Location: [place];  Service: ENT;  Laterality: N/A;   ESOPHAGOSCOPY N/A 06/24/2021   Procedure: ESOPHAGOSCOPY WITH DILATATION;  Surgeon: [name], MD;  Location: [place];  Service: ENT;  Laterality: N/A;   ESOPHAGOSCOPY WITH DILITATION N/A 09/21/2014   Procedure: ESOPHAGOSCOPY WITH DILITATION;  Surgeon: [name], MD;  Location: [place];  Service: ENT;  Laterality: N/A;   ESOPHAGOSCOPY WITH DILITATION N/A 07/04/2016   Procedure: ESOPHAGOSCOPY WITH DILITATION;  Surgeon: [name], MD;  Location:  [place] OR;  Service: ENT;  Laterality: N/A;   ESOPHAGOSCOPY WITH DILITATION N/A 12/01/2017   Procedure: ESOPHAGOSCOPY WITH DILITATION;  Surgeon: [name], MD;  Location: [place];  Service: ENT;  Laterality: N/A;   ESOPHAGOSCOPY WITH DILITATION N/A 04/19/2018   Procedure: ESOPHAGOSCOPY WITH DILITATION;  Surgeon: [name], MD;  Location: [place];  Service: ENT;  Laterality: N/A;   ESOPHAGOSCOPY WITH DILITATION N/A 03/07/2019   Procedure: Esophagoscopy with dilatation;   Surgeon: [name], MD;  Location: [place];  Service: ENT;  Laterality: N/A;   ESOPHAGOSCOPY WITH DILITATION N/A 07/16/2020   Procedure: ESOPHAGOSCOPY WITH DILATION;  Surgeon: [name], MD;  Location: [place];  Service: ENT;  Laterality: N/A;   [place] N/A 10/08/2020   Procedure: ESOPHAGOSCOPY With Dilation;  Surgeon: [name], MD;  Location: [place];  Service: ENT;  Laterality: N/A;  21 [place] to [place]  01/08/2018       FOREIGN BODY REMOVAL  05/11/2021   Procedure: FOREIGN BODY REMOVAL;  Surgeon: Garner Nash, DO;  Location: [place];  Service: Pulmonary;;   FOREIGN BODY REMOVAL BRONCHIAL  10/02/2011   Procedure: REMOVAL FOREIGN BODY BRONCHIAL;  Surgeon: Ruby Cola, MD;  Location: [place];  Service: ENT;  Laterality: N/A;   FOREIGN BODY REMOVAL BRONCHIAL N/A 01/08/2018   Procedure: REMOVAL FOREIGN BODY BRONCHIAL;  Surgeon: Jodi Marble, MD;  [facility];  Service: ENT;  Laterality: N/A;   HEMOSTASIS CONTROL  05/11/2021   Procedure: HEMOSTASIS CONTROL;  Surgeon: Garner Nash, DO;  Location: [place];  Service: Pulmonary;;   LARYNGECTOMY     Porta cath removal     PORTACATH PLACEMENT  09/17/10   Tip in cavoatrial junction   RE-EXCISION OF BREAST CANCER,SUPERIOR MARGINS Right 07/29/2018   Procedure: RE-EXCISION OF RIGHT BREAST MEDIAL MARGINS;  Surgeon: Rolm Bookbinder, MD;  Location: [place];  Service: General;  Laterality: Right;   RIGID ESOPHAGOSCOPY N/A 03/19/2019   Procedure: FLEXIBLE ESOPHAGOSCOPY;  Surgeon: Jodi Marble, MD;  Location: [place];  Service: ENT;  Laterality: N/A;   STOMAPLASTY N/A 10/21/2016   Procedure: Zola Button;  Surgeon: [name], MD;  Location: [place];  Service: ENT;  Laterality: N/A;   TOTAL KNEE ARTHROPLASTY Right 12/12/2019   Procedure: RIGHT TOTAL KNEE ARTHROPLASTY;  Surgeon: Leandrew Koyanagi, MD;  Location: [place];  Service:  Orthopedics;  Laterality: Right;   TOTAL KNEE ARTHROPLASTY Left 04/01/2021   Procedure: LEFT TOTAL KNEE ARTHROPLASTY;  Surgeon: Leandrew Koyanagi, MD;  Location: [place];  Service: Orthopedics;  Laterality: Left;   TRACHEAL DILITATION  07/16/2011   Procedure: TRACHEAL DILITATION;  Surgeon: Beckie Salts, MD;  Location: [place];  Service: ENT;  Laterality: N/A;  dilation of tracheal stoma and replacement of stoma tube   [place] Bilateral 05/11/2021   Procedure: VIDEO BRONCHOSCOPY WITHOUT FLUORO;  Surgeon: Garner Nash, DO;  Location: [place];  Service: Pulmonary;  Laterality: Bilateral;  w/ cryotherapy, Foreign body extraction    Family History  Problem Relation Age of Onset   Heart disease Mother    Heart disease Father    Heart disease Sister    Cancer Brother        type unknown   Liver disease Maternal Grandmother    Cancer Sister        type unknown   Diabetes Sister    Breast cancer Neg Hx  Social History:  reports that she quit smoking about 11 years ago. Her smoking use included cigarettes. She has a 40.00 pack-year smoking history. She has never used smokeless tobacco. She reports current alcohol use. She reports that she does not use drugs.  Allergies:  Allergies  Allergen Reactions   Lisinopril Cough    No medications prior to admission.    No results found. However, due to the size of the patient record, not all encounters were searched. Please check Results Review for a complete set of results. No results found.  ROS: otherwise negative  There were no vitals taken for this visit.  PHYSICAL EXAM: Overall appearance:  Healthy appearing, in no distress Head:  Normocephalic, atraumatic. Ears: External auditory canals are clear; tympanic membranes are intact and the middle ears are free of any effusion. Nose: External nose is healthy in appearance. Internal nasal exam free of any lesions or obstruction. Oral Cavity/pharynx:   There are no mucosal lesions or masses identified. Neuro:  No identifiable neurologic deficits. Neck: Stoma is clear. No palpable neck masses.  Studies Reviewed: none    Assessment/Plan Proceed with esophagoscopy and dilitation.  [name] 02/13/2022, 10:52 AM

## 2022-02-14 ENCOUNTER — Ambulatory Visit (HOSPITAL_BASED_OUTPATIENT_CLINIC_OR_DEPARTMENT_OTHER): Payer: Medicaid Other | Admitting: Vascular Surgery

## 2022-02-14 ENCOUNTER — Ambulatory Visit (HOSPITAL_COMMUNITY): Payer: Medicaid Other | Admitting: Vascular Surgery

## 2022-02-14 ENCOUNTER — Encounter (HOSPITAL_COMMUNITY)
Admission: RE | Disposition: A | Discharge status: Home / Self Care | Payer: Self-pay | Source: Ambulatory Visit | Attending: Otolaryngology

## 2022-02-14 ENCOUNTER — Ambulatory Visit (HOSPITAL_COMMUNITY)
Admission: RE | Admit: 2022-02-14 | Discharge: 2022-02-14 | Disposition: A | Discharge status: Home / Self Care | Payer: Medicaid Other | Source: Ambulatory Visit | Attending: Otolaryngology | Admitting: Otolaryngology

## 2022-02-14 ENCOUNTER — Encounter (HOSPITAL_COMMUNITY): Payer: Self-pay | Admitting: Otolaryngology

## 2022-02-14 ENCOUNTER — Other Ambulatory Visit: Payer: Self-pay

## 2022-02-14 DIAGNOSIS — K222 Esophageal obstruction: Secondary | ICD-10-CM | POA: Insufficient documentation

## 2022-02-14 DIAGNOSIS — Z87891 Personal history of nicotine dependence: Secondary | ICD-10-CM | POA: Diagnosis not present

## 2022-02-14 DIAGNOSIS — Z8249 Family history of ischemic heart disease and other diseases of the circulatory system: Secondary | ICD-10-CM | POA: Insufficient documentation

## 2022-02-14 DIAGNOSIS — I10 Essential (primary) hypertension: Secondary | ICD-10-CM | POA: Insufficient documentation

## 2022-02-14 DIAGNOSIS — J449 Chronic obstructive pulmonary disease, unspecified: Secondary | ICD-10-CM | POA: Insufficient documentation

## 2022-02-14 DIAGNOSIS — M1990 Unspecified osteoarthritis, unspecified site: Secondary | ICD-10-CM | POA: Insufficient documentation

## 2022-02-14 HISTORY — PX: ESOPHAGOSCOPY: SHX5534

## 2022-02-14 LAB — BASIC METABOLIC PANEL
Anion gap: 8 (ref 5–15)
BUN: 13 mg/dL (ref 8–23)
CO2: 23 mmol/L (ref 22–32)
Calcium: 9.8 mg/dL (ref 8.9–10.3)
Chloride: 108 mmol/L (ref 98–111)
Creatinine, Ser: 0.81 mg/dL (ref 0.44–1.00)
GFR, Estimated: >60 mL/min (ref >60)
Glucose, Bld: 114 mg/dL — ABNORMAL HIGH (ref 70–99)
Potassium: 4.1 mmol/L (ref 3.5–5.1)
Sodium: 139 mmol/L (ref 135–145)

## 2022-02-14 LAB — CBC
HCT: 39.8 % (ref 36.0–46.0)
Hemoglobin: 13 g/dL (ref 12.0–15.0)
MCH: 28.3 pg (ref 26.0–34.0)
MCHC: 32.7 g/dL (ref 30.0–36.0)
MCV: 86.5 fL (ref 80.0–100.0)
Platelets: 359 10*3/uL (ref 150–400)
RBC: 4.6 MIL/uL (ref 3.87–5.11)
RDW: 13.2 % (ref 11.5–15.5)
WBC: 7.1 10*3/uL (ref 4.0–10.5)
nRBC: 0 % (ref 0.0–0.2)

## 2022-02-14 SURGERY — ESOPHAGOSCOPY
Anesthesia: General

## 2022-02-14 MED ORDER — LACTATED RINGERS IV SOLN: INTRAVENOUS | Status: DC

## 2022-02-14 MED ORDER — FENTANYL CITRATE (PF) 250 MCG/5ML IJ SOLN
INTRAMUSCULAR | Status: DC | PRN
  Administered 2022-02-14: 50 ug via INTRAVENOUS

## 2022-02-14 MED ORDER — FENTANYL CITRATE (PF) 250 MCG/5ML IJ SOLN
INTRAMUSCULAR | Status: AC
  Filled 2022-02-14: qty 5

## 2022-02-14 MED ORDER — DEXAMETHASONE SODIUM PHOSPHATE 10 MG/ML IJ SOLN
INTRAMUSCULAR | Status: AC
  Filled 2022-02-14: qty 5

## 2022-02-14 MED ORDER — PROPOFOL 10 MG/ML IV BOLUS
INTRAVENOUS | Status: DC | PRN
  Administered 2022-02-14: 100 mg via INTRAVENOUS

## 2022-02-14 MED ORDER — CHLORHEXIDINE GLUCONATE 0.12 % MT SOLN
15.0000 mL | Freq: Once | OROMUCOSAL | Status: AC
  Administered 2022-02-14: 15 mL via OROMUCOSAL
  Filled 2022-02-14: qty 15

## 2022-02-14 MED ORDER — LIDOCAINE 2% (20 MG/ML) 5 ML SYRINGE
INTRAMUSCULAR | Status: DC | PRN
  Administered 2022-02-14: 60 mg via INTRAVENOUS

## 2022-02-14 MED ORDER — LIDOCAINE 2% (20 MG/ML) 5 ML SYRINGE
INTRAMUSCULAR | Status: AC
Start: 2022-02-14 — End: ?
  Filled 2022-02-14: qty 15

## 2022-02-14 MED ORDER — MIDAZOLAM HCL 2 MG/2ML IJ SOLN
INTRAMUSCULAR | Status: DC | PRN
  Administered 2022-02-14: 2 mg via INTRAVENOUS

## 2022-02-14 MED ORDER — ONDANSETRON HCL 4 MG/2ML IJ SOLN
INTRAMUSCULAR | Status: AC
  Filled 2022-02-14: qty 10

## 2022-02-14 MED ORDER — ONDANSETRON HCL 4 MG/2ML IJ SOLN
INTRAMUSCULAR | Status: DC | PRN
  Administered 2022-02-14: 4 mg via INTRAVENOUS

## 2022-02-14 MED ORDER — SUCCINYLCHOLINE CHLORIDE 200 MG/10ML IV SOSY
PREFILLED_SYRINGE | INTRAVENOUS | Status: AC
Start: 2022-02-14 — End: ?
  Filled 2022-02-14: qty 20

## 2022-02-14 MED ORDER — PROPOFOL 10 MG/ML IV BOLUS
INTRAVENOUS | Status: AC
  Filled 2022-02-14: qty 20

## 2022-02-14 MED ORDER — DEXAMETHASONE SODIUM PHOSPHATE 10 MG/ML IJ SOLN
INTRAMUSCULAR | Status: DC | PRN
  Administered 2022-02-14: 5 mg via INTRAVENOUS

## 2022-02-14 MED ORDER — ORAL CARE MOUTH RINSE: 15.0000 mL | Freq: Once | OROMUCOSAL | Status: AC

## 2022-02-14 MED ORDER — MIDAZOLAM HCL 2 MG/2ML IJ SOLN
INTRAMUSCULAR | Status: AC
  Filled 2022-02-14: qty 2

## 2022-02-14 MED ORDER — EPHEDRINE 5 MG/ML INJ
INTRAVENOUS | Status: AC
  Filled 2022-02-14: qty 5

## 2022-02-14 MED ORDER — PHENYLEPHRINE 80 MCG/ML (10ML) SYRINGE FOR IV PUSH (FOR BLOOD PRESSURE SUPPORT)
PREFILLED_SYRINGE | INTRAVENOUS | Status: AC
  Filled 2022-02-14: qty 10

## 2022-02-14 MED ORDER — ROCURONIUM BROMIDE 10 MG/ML (PF) SYRINGE
PREFILLED_SYRINGE | INTRAVENOUS | Status: AC
  Filled 2022-02-14: qty 20

## 2022-02-14 SURGICAL SUPPLY — 9 items
CANISTER SUCT 3000ML PPV (MISCELLANEOUS) ×1 IMPLANT
COVER MAYO STAND XLG (MISCELLANEOUS) IMPLANT
DRAPE HALF SHEET 40X57 (DRAPES) ×1 IMPLANT
GLOVE ECLIPSE 7.5 STRL STRAW (GLOVE) ×1 IMPLANT
GUARD TEETH (MISCELLANEOUS) IMPLANT
KIT TURNOVER KIT B (KITS) ×1 IMPLANT
PAD ARMBOARD 7.5X6 YLW CONV (MISCELLANEOUS) ×2 IMPLANT
TOWEL GREEN STERILE FF (TOWEL DISPOSABLE) ×1 IMPLANT
TUBE CONNECTING 12X1/4 (SUCTIONS) ×1 IMPLANT

## 2022-02-14 NOTE — Anesthesia Procedure Notes (Signed)
Procedure Name: Intubation Date/Time: 02/14/2022 11:13 AM  Performed by: Darletta Moll, CRNAPre-anesthesia Checklist: Patient identified, Emergency Drugs available, Suction available and Patient being monitored Patient Re-evaluated:Patient Re-evaluated prior to induction Oxygen Delivery Method: Circle system utilized Preoxygenation: Pre-oxygenation with 100% oxygen Induction Type: IV induction Ventilation: Unable to mask ventilate Tube type: Oral Tube size: 6.0 mm Number of attempts: 1 Placement Confirmation: positive ETCO2, breath sounds checked- equal and bilateral and CO2 detector Tube secured with: Tape Dental Injury: Teeth and Oropharynx as per pre-operative assessment  Comments: Reinforced 6.0cuffed tube placed into tracheostomy stoma

## 2022-02-14 NOTE — Transfer of Care (Signed)
Immediate Anesthesia Transfer of Care Note  Patient: Deborah [name]  Procedure(s) Performed: ESOPHAGOSCOPY WITH DILATATION  Patient Location: PACU  Anesthesia Type:General  Level of Consciousness: drowsy and patient cooperative  Airway & Oxygen Therapy: Patient Spontanous Breathing and Patient connected to face mask oxygen  Post-op Assessment: Report given to RN, Post -op Vital signs reviewed and stable and Patient moving all extremities X 4  Post vital signs: Reviewed and stable  Last Vitals:  Vitals Value Taken Time  BP 110/59 02/14/22 1126  Temp    Pulse 74 02/14/22 1129  Resp 29 02/14/22 1129  SpO2 89 % 02/14/22 1129  Vitals shown include unvalidated device data.  Last Pain:  Vitals:   02/14/22 0932  PainSc: 0-No pain         Complications: No notable events documented.

## 2022-02-14 NOTE — Op Note (Signed)
OPERATIVE REPORT  DATE OF SURGERY: 02/14/2022  PATIENT:  Deborah [name],  66 y.o. female  PRE-OPERATIVE DIAGNOSIS:  Esophageal stricture  POST-OPERATIVE DIAGNOSIS:  Esophageal stricture  PROCEDURE:  Procedure(s): ESOPHAGOSCOPY WITH DILATATION  SURGEON:  Beckie Salts, MD  ASSISTANTS: None  ANESTHESIA:   General   EBL: 0 ml  DRAINS: None  LOCAL MEDICATIONS USED:  None  SPECIMEN:  none  COUNTS:  Correct  PROCEDURE DETAILS: The patient was taken to the operating room and placed on the operating table in the supine position. Following induction of general endotracheal anesthesia, using the laryngeal stoma, the table was turned 90 degrees and the patient was draped in a standard fashion.  A Jako laryngoscope was used to evaluate the pharynx and this was attached to the [employer] stand with the suspension apparatus.  The [facility] dilators were then used, starting at 26 [place] and serially dilating the esophagus up to 49 [place].  The scope was removed and the patient was awakened extubated and transferred to recovery in stable condition.    PATIENT DISPOSITION:  To PACU, stable

## 2022-02-14 NOTE — Interval H&P Note (Signed)
History and Physical Interval Note:  02/14/2022 10:29 AM  Deborah [name]  has presented today for surgery, with the diagnosis of Esophageal stricture.  The various methods of treatment have been discussed with the patient and family. After consideration of risks, benefits and other options for treatment, the patient has consented to  Procedure(s): ESOPHAGOSCOPY WITH DILATATION (N/A) as a surgical intervention.  The patient's history has been reviewed, patient examined, no change in status, stable for surgery.  I have reviewed the patient's chart and labs.  Questions were answered to the patient's satisfaction.     [name]

## 2022-02-14 NOTE — Anesthesia Postprocedure Evaluation (Signed)
Anesthesia Post Note  Patient: Grace Blight Tennell  Procedure(s) Performed: ESOPHAGOSCOPY WITH DILATATION     Patient location during evaluation: PACU Anesthesia Type: General Level of consciousness: awake and alert, oriented and patient cooperative Pain management: pain level controlled Vital Signs Assessment: post-procedure vital signs reviewed and stable Respiratory status: spontaneous breathing, nonlabored ventilation and respiratory function stable Cardiovascular status: blood pressure returned to baseline and stable Postop Assessment: no apparent nausea or vomiting Anesthetic complications: no   No notable events documented.  Last Vitals:  Vitals:   02/14/22 1200 02/14/22 1215  BP: (!) 148/61 (!) 150/66  Pulse: 70 72  Resp: 19 16  Temp:  36.6 C  SpO2: 96% 99%    Last Pain:  Vitals:   02/14/22 1215  PainSc: 0-No pain                 Pervis Hocking

## 2022-02-15 ENCOUNTER — Encounter (HOSPITAL_COMMUNITY): Payer: Self-pay | Admitting: Otolaryngology

## 2022-02-18 ENCOUNTER — Inpatient Hospital Stay: Payer: Medicaid Other | Attending: Hematology and Oncology | Admitting: Hematology and Oncology

## 2022-02-18 NOTE — Assessment & Plan Note (Deleted)
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  Breast cancer surveillance: 1.Mammogram in  1/23/2023benign 2.Bone density 03/2019 normal 3.Breast exam  02/18/2022: Benign  Return to clinic in 1 year for follow-up

## 2022-02-19 ENCOUNTER — Other Ambulatory Visit: Payer: Self-pay

## 2022-02-19 MED ORDER — LOSARTAN POTASSIUM-HCTZ 100-25 MG PO TABS: 1.0000 | ORAL_TABLET | Freq: Every day | ORAL | 0 refills | Status: DC

## 2022-02-20 DIAGNOSIS — I10 Essential (primary) hypertension: Secondary | ICD-10-CM | POA: Diagnosis not present

## 2022-02-20 DIAGNOSIS — Z9002 Acquired absence of larynx: Secondary | ICD-10-CM | POA: Diagnosis not present

## 2022-02-20 DIAGNOSIS — Z448 Encounter for fitting and adjustment of other external prosthetic devices: Secondary | ICD-10-CM | POA: Diagnosis not present

## 2022-02-20 DIAGNOSIS — Z79899 Other long term (current) drug therapy: Secondary | ICD-10-CM | POA: Diagnosis not present

## 2022-02-20 DIAGNOSIS — Z8521 Personal history of malignant neoplasm of larynx: Secondary | ICD-10-CM | POA: Diagnosis not present

## 2022-03-13 DIAGNOSIS — C321 Malignant neoplasm of supraglottis: Secondary | ICD-10-CM | POA: Diagnosis not present

## 2022-03-13 DIAGNOSIS — C329 Malignant neoplasm of larynx, unspecified: Secondary | ICD-10-CM | POA: Diagnosis not present

## 2022-03-13 DIAGNOSIS — Z930 Tracheostomy status: Secondary | ICD-10-CM | POA: Diagnosis not present

## 2022-03-26 ENCOUNTER — Ambulatory Visit (INDEPENDENT_AMBULATORY_CARE_PROVIDER_SITE_OTHER): Payer: Medicaid Other

## 2022-03-26 DIAGNOSIS — Z23 Encounter for immunization: Secondary | ICD-10-CM

## 2022-03-26 DIAGNOSIS — Z111 Encounter for screening for respiratory tuberculosis: Secondary | ICD-10-CM

## 2022-03-26 NOTE — Progress Notes (Signed)
Patient is here for a PPD placement.  PPD placed in left forearm @ 10 am.  Patient will return 03/28/2022 to have PPD read.   Patient also requesting flu vaccination. Administered in LD, site unremarkable, tolerated injection well.   Talbot Grumbling, RN

## 2022-03-26 NOTE — Telephone Encounter (Signed)
Note not needed 

## 2022-03-28 ENCOUNTER — Ambulatory Visit: Payer: Medicaid Other

## 2022-03-28 DIAGNOSIS — Z111 Encounter for screening for respiratory tuberculosis: Secondary | ICD-10-CM

## 2022-03-28 LAB — TB SKIN TEST
Induration: 0 mm
TB Skin Test: NEGATIVE

## 2022-03-28 NOTE — Progress Notes (Signed)
PPD Reading Note PPD read and results entered in [place]. Result: 0 mm induration. Interpretation: Negative Allergic reaction: Yes

## 2022-04-13 DIAGNOSIS — C321 Malignant neoplasm of supraglottis: Secondary | ICD-10-CM | POA: Diagnosis not present

## 2022-04-13 DIAGNOSIS — Z930 Tracheostomy status: Secondary | ICD-10-CM | POA: Diagnosis not present

## 2022-04-13 DIAGNOSIS — C329 Malignant neoplasm of larynx, unspecified: Secondary | ICD-10-CM | POA: Diagnosis not present

## 2022-04-21 ENCOUNTER — Other Ambulatory Visit: Payer: Self-pay | Admitting: Pulmonary Disease

## 2022-04-21 DIAGNOSIS — R0982 Postnasal drip: Secondary | ICD-10-CM

## 2022-04-23 ENCOUNTER — Other Ambulatory Visit: Payer: Self-pay | Admitting: Family Medicine

## 2022-05-13 DIAGNOSIS — C329 Malignant neoplasm of larynx, unspecified: Secondary | ICD-10-CM | POA: Diagnosis not present

## 2022-05-13 DIAGNOSIS — Z930 Tracheostomy status: Secondary | ICD-10-CM | POA: Diagnosis not present

## 2022-05-13 DIAGNOSIS — C321 Malignant neoplasm of supraglottis: Secondary | ICD-10-CM | POA: Diagnosis not present

## 2022-05-19 ENCOUNTER — Other Ambulatory Visit: Payer: Self-pay | Admitting: Pulmonary Disease

## 2022-05-19 DIAGNOSIS — R0982 Postnasal drip: Secondary | ICD-10-CM

## 2022-06-03 ENCOUNTER — Ambulatory Visit: Payer: Medicaid Other | Admitting: Family Medicine

## 2022-06-03 VITALS — BP 168/92 | HR 98 | Ht 67.0 in | Wt 226.0 lb

## 2022-06-03 DIAGNOSIS — J441 Chronic obstructive pulmonary disease with (acute) exacerbation: Secondary | ICD-10-CM | POA: Diagnosis not present

## 2022-06-03 DIAGNOSIS — E039 Hypothyroidism, unspecified: Secondary | ICD-10-CM

## 2022-06-03 DIAGNOSIS — B9689 Other specified bacterial agents as the cause of diseases classified elsewhere: Secondary | ICD-10-CM | POA: Diagnosis not present

## 2022-06-03 DIAGNOSIS — J019 Acute sinusitis, unspecified: Secondary | ICD-10-CM

## 2022-06-03 DIAGNOSIS — R0602 Shortness of breath: Secondary | ICD-10-CM

## 2022-06-03 MED ORDER — PREDNISONE 20 MG PO TABS: 40.0000 mg | ORAL_TABLET | Freq: Every day | ORAL | 0 refills | Status: AC

## 2022-06-03 MED ORDER — BENZONATATE 200 MG PO CAPS: 200.0000 mg | ORAL_CAPSULE | Freq: Two times a day (BID) | ORAL | 0 refills | Status: DC | PRN

## 2022-06-03 MED ORDER — ONDANSETRON HCL 4 MG PO TABS: 4.0000 mg | ORAL_TABLET | Freq: Three times a day (TID) | ORAL | 0 refills | Status: DC | PRN

## 2022-06-03 MED ORDER — AMOXICILLIN-POT CLAVULANATE 875-125 MG PO TABS: 1.0000 | ORAL_TABLET | Freq: Two times a day (BID) | ORAL | 0 refills | Status: AC

## 2022-06-03 NOTE — Patient Instructions (Addendum)
It was nice seeing you today!  Take antibiotics and steroids as prescribed.  You can take Tessalon as needed for cough.  You can take Zofran as needed for nausea.  Make sure to stay well hydrated.  If you're not feeling better next week, come back and see [place].  Stay well, Zola Button, MD [place] [phone]  --  Make sure to check out at the front desk before you leave today.  Please arrive at least 15 minutes prior to your scheduled appointments.  If you had blood work today, I will send you a MyChart message or a letter if results are normal. Otherwise, I will give you a call.  If you had a referral placed, they will call you to set up an appointment. Please give [place] a call if you don't hear back in the next 2 weeks.  If you need additional refills before your next appointment, please call your pharmacy first.

## 2022-06-03 NOTE — Assessment & Plan Note (Signed)
TSH never rechecked after levothyroxine dose increased earlier this year, will check TSH today

## 2022-06-03 NOTE — Progress Notes (Signed)
    SUBJECTIVE:   CHIEF COMPLAINT / HPI:  Chief Complaint  Patient presents with   Nasal Congestion   Headache   Cough    Patient reports she has been feeling ill for about 2 weeks with non-productive cough, congestion, and headaches. She reports her symptoms did improve but came back about 4 days ago. She has had some associated SOB, reports she has been using her Pulmicort nebulizer and albuterol nebulizer every 4 hours. She started having diarrhea and vomiting about 2 days ago. She has not been eating as much but still drinking OK. Denies fever, chills. No sputum production. Husband was also ill.  PERTINENT  PMH / PSH: asthma/COPD, allergic rhinitis, HTN, hypothyroidism, laryngeal cancer s/p radiation  Patient Care Team: Zola Button, MD as PCP - General (Family Medicine) [name], MD as Attending Physician (Otolaryngology) Kyung Rudd, MD (Radiation Oncology) Heath Lark, MD as Consulting Physician (Hematology and Oncology) Rolm Bookbinder, MD as Consulting Physician (General Surgery) Nicholas Lose, MD as Consulting Physician (Hematology and Oncology) Eppie Gibson, MD as Attending Physician (Radiation Oncology)   OBJECTIVE:   BP (!) 168/92   Pulse 98   Ht '5\' 7"'$  (1.702 m)   Wt 226 lb (102.5 kg)   SpO2 92%   BMI 35.40 kg/m   Physical Exam Constitutional:      General: She is not in acute distress.    Appearance: She is well-developed.  HENT:     Head: Normocephalic and atraumatic.     Comments: Bilateral frontal and maxillary sinus tenderness Neck:     Comments: Tracheal stoma site without surrounding erythema or purulence Cardiovascular:     Rate and Rhythm: Normal rate and regular rhythm.  Pulmonary:     Effort: Pulmonary effort is normal. No respiratory distress.     Breath sounds: Normal breath sounds. No rales.     Comments: Diminished breath sounds throughout, no wheezes Musculoskeletal:     Cervical back: Neck supple.  Neurological:     Mental  Status: She is alert.         06/03/2022    8:45 AM  Depression screen PHQ 2/9  Decreased Interest 1  Down, Depressed, Hopeless 3  PHQ - 2 Score 4  Altered sleeping 2  Tired, decreased energy 0  Change in appetite 0  Feeling bad or failure about yourself  0  Trouble concentrating 0  Moving slowly or fidgety/restless 0  Suicidal thoughts 0  PHQ-9 Score 6  Difficult doing work/chores Somewhat difficult     {Show previous vital signs (optional):23777}    ASSESSMENT/PLAN:   COPD (chronic obstructive pulmonary disease) (HCC) 2 weeks of nonproductive cough and upper respiratory symptoms with increased dyspnea.  Suspect she likely has a bacterial sinusitis and possible pneumonia component (though afebrile and without adventitious lung sounds) resulting in asthma/COPD exacerbation.  Will treat with antibiotics and steroids. - prednisone 40 mg x 5d - amoxicillin-clavulanate BID x 7d - Zofran prn for nausea - benzonatate prn for cough - CXR  Hypothyroidism TSH never rechecked after levothyroxine dose increased earlier this year, will check TSH today    Return in about 4 weeks (around 07/01/2022) for f/u HTN.   Zola Button, MD [place]

## 2022-06-03 NOTE — Assessment & Plan Note (Signed)
2 weeks of nonproductive cough and upper respiratory symptoms with increased dyspnea.  Suspect she likely has a bacterial sinusitis and possible pneumonia component (though afebrile and without adventitious lung sounds) resulting in asthma/COPD exacerbation.  Will treat with antibiotics and steroids. - prednisone 40 mg x 5d - amoxicillin-clavulanate BID x 7d - Zofran prn for nausea - benzonatate prn for cough - CXR

## 2022-06-04 DIAGNOSIS — I10 Essential (primary) hypertension: Secondary | ICD-10-CM | POA: Diagnosis not present

## 2022-06-04 DIAGNOSIS — Z888 Allergy status to other drugs, medicaments and biological substances status: Secondary | ICD-10-CM | POA: Diagnosis not present

## 2022-06-04 DIAGNOSIS — Z08 Encounter for follow-up examination after completed treatment for malignant neoplasm: Secondary | ICD-10-CM | POA: Diagnosis not present

## 2022-06-04 DIAGNOSIS — Z79899 Other long term (current) drug therapy: Secondary | ICD-10-CM | POA: Diagnosis not present

## 2022-06-04 DIAGNOSIS — Z8521 Personal history of malignant neoplasm of larynx: Secondary | ICD-10-CM | POA: Diagnosis not present

## 2022-06-04 LAB — TSH: TSH: <0.005 u[IU]/mL — ABNORMAL LOW (ref 0.450–4.500)

## 2022-06-05 ENCOUNTER — Other Ambulatory Visit: Payer: Self-pay | Admitting: Family Medicine

## 2022-06-06 ENCOUNTER — Ambulatory Visit
Admission: RE | Admit: 2022-06-06 | Discharge: 2022-06-06 | Disposition: A | Discharge status: Home / Self Care | Payer: Medicaid Other | Source: Ambulatory Visit | Attending: Family Medicine | Admitting: Family Medicine

## 2022-06-06 ENCOUNTER — Telehealth: Payer: Self-pay | Admitting: Family Medicine

## 2022-06-06 DIAGNOSIS — J441 Chronic obstructive pulmonary disease with (acute) exacerbation: Secondary | ICD-10-CM

## 2022-06-06 DIAGNOSIS — R0602 Shortness of breath: Secondary | ICD-10-CM

## 2022-06-06 NOTE — Telephone Encounter (Signed)
Call patient discuss lab results.  TSH low will need to decrease levothyroxine dose.  Advised patient to decrease from 225 mcg to 200 mcg.  Will repeat TSH at follow-up in in 1 month.  If TSH is elevated, may need to try 212 mcg.  Patient reports she is feeling much better after starting antibiotics and steroids.  I advised patient it may still be a good idea to get the chest x-ray which she was agreeable to.  She will get this later today or next week.

## 2022-06-07 DIAGNOSIS — C321 Malignant neoplasm of supraglottis: Secondary | ICD-10-CM | POA: Diagnosis not present

## 2022-06-07 DIAGNOSIS — C329 Malignant neoplasm of larynx, unspecified: Secondary | ICD-10-CM | POA: Diagnosis not present

## 2022-06-07 DIAGNOSIS — Z930 Tracheostomy status: Secondary | ICD-10-CM | POA: Diagnosis not present

## 2022-06-12 ENCOUNTER — Other Ambulatory Visit: Payer: Self-pay | Admitting: Family Medicine

## 2022-06-12 DIAGNOSIS — Z1231 Encounter for screening mammogram for malignant neoplasm of breast: Secondary | ICD-10-CM

## 2022-06-20 ENCOUNTER — Other Ambulatory Visit: Payer: Self-pay | Admitting: Family Medicine

## 2022-06-27 DIAGNOSIS — Z8521 Personal history of malignant neoplasm of larynx: Secondary | ICD-10-CM | POA: Diagnosis not present

## 2022-06-27 DIAGNOSIS — Z448 Encounter for fitting and adjustment of other external prosthetic devices: Secondary | ICD-10-CM | POA: Diagnosis not present

## 2022-06-27 DIAGNOSIS — Z9002 Acquired absence of larynx: Secondary | ICD-10-CM | POA: Diagnosis not present

## 2022-07-03 ENCOUNTER — Ambulatory Visit (INDEPENDENT_AMBULATORY_CARE_PROVIDER_SITE_OTHER): Payer: Medicaid Other | Admitting: Family Medicine

## 2022-07-03 ENCOUNTER — Encounter: Payer: Self-pay | Admitting: Family Medicine

## 2022-07-03 VITALS — BP 135/71 | HR 100 | Ht 67.0 in | Wt 221.4 lb

## 2022-07-03 DIAGNOSIS — I10 Essential (primary) hypertension: Secondary | ICD-10-CM

## 2022-07-03 DIAGNOSIS — E039 Hypothyroidism, unspecified: Secondary | ICD-10-CM | POA: Diagnosis not present

## 2022-07-03 DIAGNOSIS — L304 Erythema intertrigo: Secondary | ICD-10-CM

## 2022-07-03 DIAGNOSIS — J4540 Moderate persistent asthma, uncomplicated: Secondary | ICD-10-CM | POA: Diagnosis not present

## 2022-07-03 DIAGNOSIS — F339 Major depressive disorder, recurrent, unspecified: Secondary | ICD-10-CM | POA: Diagnosis not present

## 2022-07-03 DIAGNOSIS — R0982 Postnasal drip: Secondary | ICD-10-CM | POA: Diagnosis not present

## 2022-07-03 MED ORDER — BENZONATATE 200 MG PO CAPS: 200.0000 mg | ORAL_CAPSULE | Freq: Two times a day (BID) | ORAL | 0 refills | Status: DC | PRN

## 2022-07-03 MED ORDER — METOPROLOL TARTRATE 50 MG PO TABS
50.0000 mg | ORAL_TABLET | Freq: Two times a day (BID) | ORAL | 1 refills | Status: DC
Start: 2022-07-03 — End: 2022-09-09

## 2022-07-03 MED ORDER — ESCITALOPRAM OXALATE 20 MG PO TABS: 20.0000 mg | ORAL_TABLET | Freq: Every day | ORAL | 1 refills | Status: DC

## 2022-07-03 MED ORDER — LEVOTHYROXINE SODIUM 200 MCG PO TABS: 200.0000 ug | ORAL_TABLET | Freq: Every day | ORAL | 0 refills | Status: DC

## 2022-07-03 MED ORDER — NYSTATIN 100000 UNIT/GM EX CREA: 1.0000 | TOPICAL_CREAM | Freq: Every day | CUTANEOUS | 1 refills | Status: DC | PRN

## 2022-07-03 MED ORDER — AMLODIPINE BESYLATE 10 MG PO TABS: 10.0000 mg | ORAL_TABLET | Freq: Every day | ORAL | 1 refills | Status: DC

## 2022-07-03 MED ORDER — FLUTICASONE PROPIONATE 50 MCG/ACT NA SUSP: 1.0000 | Freq: Every day | NASAL | 4 refills | Status: DC

## 2022-07-03 MED ORDER — OMEPRAZOLE 40 MG PO CPDR: 40.0000 mg | DELAYED_RELEASE_CAPSULE | Freq: Two times a day (BID) | ORAL | 1 refills | Status: DC

## 2022-07-03 MED ORDER — MONTELUKAST SODIUM 10 MG PO TABS: 10.0000 mg | ORAL_TABLET | Freq: Every day | ORAL | 1 refills | Status: DC

## 2022-07-03 MED ORDER — LOSARTAN POTASSIUM-HCTZ 100-25 MG PO TABS: 1.0000 | ORAL_TABLET | Freq: Every day | ORAL | 1 refills | Status: DC

## 2022-07-03 NOTE — Patient Instructions (Addendum)
It was nice seeing you today!  Lab work today.  Make sure to schedule follow-up with the pulmonologist.  Goal blood pressure is < 140/90.  Stay well, Zola Button, MD [place] [phone]  --  Make sure to check out at the front desk before you leave today.  Please arrive at least 15 minutes prior to your scheduled appointments.  If you had blood work today, I will send you a MyChart message or a letter if results are normal. Otherwise, I will give you a call.  If you had a referral placed, they will call you to set up an appointment. Please give [place] a call if you don't hear back in the next 2 weeks.  If you need additional refills before your next appointment, please call your pharmacy first.  -- Blood Pressure Record Sheet To take your blood pressure, you will need a blood pressure machine. You can buy a blood pressure machine (blood pressure monitor) at your clinic, drug store, or online. When choosing one, consider: An automatic monitor that has an arm cuff. A cuff that wraps snugly around your upper arm. You should be able to fit only one finger between your arm and the cuff. A device that stores blood pressure reading results. Do not choose a monitor that measures your blood pressure from your wrist or finger. Follow your health care provider's instructions for how to take your blood pressure. To use this form: Take your blood pressure medications every day These measurements should be taken when you have been at rest for at least 10-15 min Take at least 2 readings with each blood pressure check. This makes sure the results are correct. Wait 1-2 minutes between measurements. Write down the results in the spaces on this form. Keep in mind it should always be recorded systolic over diastolic. Both numbers are important.  Repeat this every day for 2-3 weeks, or as told by your health care provider.  Make a follow-up appointment with your health care  provider to discuss the results.  Blood Pressure Log Date Medications taken? (Y/N) Blood Pressure Time of Day

## 2022-07-03 NOTE — Progress Notes (Signed)
SUBJECTIVE:   CHIEF COMPLAINT / HPI:  Chief Complaint  Patient presents with   Hypertension    Last visit 1 month ago I saw this patient for COPD exacerbation and likely bacterial sinusitis as well treated with antibiotics and steroids and ordered a chest x-ray which revealed a persistent left lobe atelectasis suggestive of possible central obstructing lesion.  I had her follow-up today due to elevated blood pressure.  Patient reports that her breathing is now back to baseline since last visit.  Shortness of breath significantly improved.  She reports that the atelectasis noted on CXR is known to her pulmonologist, and that atelectasis is due to scarring from when she had the foreign body in her lung which has since been removed.  She is due to see the pulmonologist next month and will make an appointment.  She reports she is still having some issues with cough, sometimes with episodes of hemoptysis.  For hypertension she takes losartan-HCTZ 100-25 mg, metoprolol titrate 50 mg twice daily, amlodipine 10 mg. Ambulatory BP readings: unsure  Needs most of her medications refilled today.  PERTINENT  PMH / PSH: asthma/COPD, allergic rhinitis, HTN, hypothyroidism, laryngeal cancer s/p radiation  Patient Care Team: Zola Button, MD as PCP - General (Family Medicine) [name], MD as Attending Physician (Otolaryngology) Kyung Rudd, MD (Radiation Oncology) Heath Lark, MD as Consulting Physician (Hematology and Oncology) Rolm Bookbinder, MD as Consulting Physician (General Surgery) Nicholas Lose, MD as Consulting Physician (Hematology and Oncology) Eppie Gibson, MD as Attending Physician (Radiation Oncology)   OBJECTIVE:   BP 135/71   Pulse 100   Ht '5\' 7"'$  (1.702 m)   Wt 221 lb 6.4 oz (100.4 kg)   SpO2 99%   BMI 34.68 kg/m   Physical Exam Constitutional:      General: She is not in acute distress. HENT:     Head: Normocephalic and atraumatic.  Neck:     Comments:  Tracheal stoma site appears normal Cardiovascular:     Rate and Rhythm: Normal rate and regular rhythm.  Pulmonary:     Effort: Pulmonary effort is normal. No respiratory distress.     Breath sounds: Normal breath sounds.  Musculoskeletal:     Cervical back: Neck supple.  Neurological:     Mental Status: She is alert.         07/03/2022    1:38 PM  Depression screen PHQ 2/9  Decreased Interest 0  Down, Depressed, Hopeless 0  PHQ - 2 Score 0  Altered sleeping 0  Tired, decreased energy 0  Change in appetite 0  Feeling bad or failure about yourself  0  Trouble concentrating 0  Moving slowly or fidgety/restless 0  Suicidal thoughts 0  PHQ-9 Score 0  Difficult doing work/chores Not difficult at all     {Show previous vital signs (optional):23777}    ASSESSMENT/PLAN:   Essential hypertension Blood pressure at goal, improved today from last office visit.  No medication changes today. - continue losartan-HCTZ 100-25 mg, metoprolol tartrate 50 mg BID, amlodipine 10 mg - advised to monitor BP  Hypothyroidism TSH low at most recent office visit, levothyroxine dose was decreased to 200 mcg. - repeat TSH, adjust levothyroxine dose if necessary   Atelectasis Persistent left lobe atelectasis noted on most recent CXR concerning for possible central obstructing lesion.  Patient believes this is due to prior foreign body.  Given her cancer history, I did advise her to discuss this with her pulmonologist at upcoming visit which  she will schedule to see if any further workup is needed.  Return in about 6 months (around 01/01/2023) for f/u HTN.   Zola Button, MD [place]

## 2022-07-03 NOTE — Assessment & Plan Note (Signed)
Blood pressure at goal, improved today from last office visit.  No medication changes today. - continue losartan-HCTZ 100-25 mg, metoprolol tartrate 50 mg BID, amlodipine 10 mg - advised to monitor BP

## 2022-07-03 NOTE — Assessment & Plan Note (Signed)
TSH low at most recent office visit, levothyroxine dose was decreased to 200 mcg. - repeat TSH, adjust levothyroxine dose if necessary

## 2022-07-04 ENCOUNTER — Telehealth: Payer: Self-pay | Admitting: Family Medicine

## 2022-07-04 DIAGNOSIS — E039 Hypothyroidism, unspecified: Secondary | ICD-10-CM

## 2022-07-04 LAB — TSH: TSH: <0.005 u[IU]/mL — ABNORMAL LOW (ref 0.450–4.500)

## 2022-07-04 NOTE — Telephone Encounter (Signed)
TSH low, she will need further reduction of her levothyroxine.  Unable to reach patient, will attempt later.  MyChart message left.

## 2022-07-07 MED ORDER — LEVOTHYROXINE SODIUM 175 MCG PO TABS: 175.0000 ug | ORAL_TABLET | Freq: Every day | ORAL | 2 refills | Status: DC

## 2022-07-07 NOTE — Addendum Note (Signed)
Addended by: Zola Button D on: 07/07/2022 09:05 AM   Modules accepted: Orders

## 2022-07-07 NOTE — Telephone Encounter (Signed)
Called patient with lab results.  TSH still low, will reduce levothyroxine further to 175 mcg.  Advised patient to have follow-up lab work in 6 weeks.  All questions answered.

## 2022-07-14 DIAGNOSIS — I10 Essential (primary) hypertension: Secondary | ICD-10-CM | POA: Diagnosis not present

## 2022-07-14 DIAGNOSIS — Z79899 Other long term (current) drug therapy: Secondary | ICD-10-CM | POA: Diagnosis not present

## 2022-07-14 DIAGNOSIS — Z8521 Personal history of malignant neoplasm of larynx: Secondary | ICD-10-CM | POA: Diagnosis not present

## 2022-07-14 DIAGNOSIS — Z888 Allergy status to other drugs, medicaments and biological substances status: Secondary | ICD-10-CM | POA: Diagnosis not present

## 2022-07-14 DIAGNOSIS — Z448 Encounter for fitting and adjustment of other external prosthetic devices: Secondary | ICD-10-CM | POA: Diagnosis not present

## 2022-07-14 DIAGNOSIS — Z9002 Acquired absence of larynx: Secondary | ICD-10-CM | POA: Diagnosis not present

## 2022-07-22 ENCOUNTER — Encounter: Payer: Self-pay | Admitting: Pulmonary Disease

## 2022-07-22 ENCOUNTER — Ambulatory Visit: Payer: Medicaid Other | Admitting: Pulmonary Disease

## 2022-07-22 VITALS — BP 130/80 | HR 105 | Ht 67.0 in | Wt 223.0 lb

## 2022-07-22 DIAGNOSIS — Z930 Tracheostomy status: Secondary | ICD-10-CM | POA: Diagnosis not present

## 2022-07-22 DIAGNOSIS — J4540 Moderate persistent asthma, uncomplicated: Secondary | ICD-10-CM

## 2022-07-22 NOTE — Progress Notes (Unsigned)
Synopsis: Referred in December 2022 for Foreign Body in Bronchus by [name]  Subjective:   PATIENT ID: Deborah [name] GENDER: female DOB: 24-Apr-1956, MRN: [id number]  HPI  Chief Complaint  Patient presents with   Follow-up    Pt f/u for asthma she states that she is coughing up a lot of mucous that look like blood clots. Having surgery on 2/26.   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   She has increased cough and shortness of breath and trouble clearing mucous secretions due to fistula closure following TEP prosthesis expulsion in late January. She is scheduled with [name] 2/26 for replacement of TEP prosthesis. She continues on nebulizer regimen but is having much harder time clearing her secretions with out prosthesis in place. She does report coughing up some blood. Denies fevers, chills or sweats.   OV 01/21/22 She has been doing well since last visit and moving to an all nebulizer regimen with budesonide and duonebs. She continues to have thick mucous plugs at times though on a daily basis. She is also using hypertonic nebs twice daily.   OV 09/17/21 She went to the ER on 3/16 with chest pains that were worse with movements. She had CTA Chest done which was negative for PE. She had diffuse mosaic attenuation and persistent collapse of the left lower lobe.   She reports her allergies are acting up and she has been having more wheezing recently. She denies increased mucous production at this time.   OV 07/16/21 She called to be seen sooner due to coughing spells of recent that wake her from her sleep. She has significant coughing spell that will lead to shortness of breath and difficulty clearing mucous. She denies fevers, chills or sweats.  She was started on flonase nasal spray and ipratropium nasal spray at last visit with some improvement in her sinus congestion but she  continues to have a lot of mucous. She is using dulera and spiriva inhalers along with duoneb nebulizer treatments.   The mucous is clear to whitish with occasional blood tinged streaks.   Chest x-ray from 05/17/21 shows persistent left lower lobe atelectasis.   OV 05/17/21 Patient seen by her ENT, [name] with CT scan on 11/17 showing obstructing foreign body in the left mainstem bronchus with total post-obstructive atelectasis of lower lung. Appointment setup for today for further evaluation but she became symptomatic and presented to the ED on 11/26. She underwent bronchoscopy with foreign body retrieval with Dr. Valeta Harms on 11/26. 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 She recovered well post-procedure and was then sent home from the ED.   She was seen yesterday by [name] with reported improvement in her breathing but continuing to have cough with mucous production.   She has been doing well since her hospital visit.  She continues to have cough with sputum production.  She also complains of significant postnasal drainage, more so than normal.  She denies any issues with aspiration.  She is being scheduled for esophagram and dilation.  Past Medical History:  Diagnosis Date   Anemia    h/o of   Anxiety    Arthritis    in knees   Asthma    Breast cancer ([place])    Bronchitis    COPD (chronic obstructive pulmonary disease) ([place])    Diverticulosis 11/15/2012   noted on screening colonoscopy  DOE (dyspnea on exertion) 04/28/2014   Esophageal stricture    FB bronchus    Former smoker 03/19/2011   GERD (gastroesophageal reflux disease)    Heart murmur    asymptomatic    History of laryngectomy    History of radiation therapy 11/15/18- 12/06/18   Right Breast total dose 42.56 Gy in 16 fractions.    Hx of radiation therapy 09/03/10 to 10/16/2010   supraglottic larynx   Hypertension    Hypothyroid    due to radiation   Internal hemorrhoid 11/15/2012   small, noted on  screening colonoscopy    Larynx cancer ([place]) 07/31/2010   supraglotttic s/p chemo/radiation and surgical rescection.   Leukocytopenia    Nausea alone 07/28/2013   Neck pain 01/21/2012   Normal MRI 07/14/2011   negative for mestasis    Pneumonia 2012   Sciatica    Seizures ([place])    07/24/11 off Effexor w/o seizure   Sepsis ([place]) 08/04/2012   Sinusitis, chronic 07/20/2011   Bilateral maxillary, identified on MRI of head 07/14/11.     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[place])      Family History  Problem Relation Age of Onset   Heart disease Mother    Heart disease Father    Heart disease Sister    Cancer Brother        type unknown   Liver disease Maternal Grandmother    Cancer Sister        type unknown   Diabetes Sister    Breast cancer Neg Hx      Social History   Socioeconomic History   Marital status: Married    Spouse name: Not on file   Number of children: 2   Years of education: 12   Highest education level: Not on file  Occupational History   Occupation: part time    Comment: warehouse work  Tobacco Use   Smoking status: Former    Packs/day: 2.00    Years: 20.00    Total pack years: 40.00    Types: Cigarettes    Quit date: 09/17/2010    Years since quitting: 11.8   Smokeless tobacco: Never  Vaping Use   Vaping Use: Never used  Substance and Sexual Activity   Alcohol use: Yes    Comment: Socially drinks    Drug use: No    Comment: tried cocaine 1 time 2010 only used 1 time   Sexual activity: Yes    Birth control/protection: Surgical  Other Topics Concern   Not on file  Social History Narrative   Lives with boyfriend   Social Determinants of Health   Financial Resource Strain: Not on file  Food Insecurity: Not on file  Transportation Needs: No Transportation Needs (08/17/2018)   PRAPARE - Hydrologist (Medical): No    Lack of Transportation (Non-Medical): No  Physical Activity: Not on file  Stress: Not on file  Social  Connections: Not on file  Intimate Partner Violence: Not At Risk (08/17/2018)   Humiliation, Afraid, Rape, and Kick questionnaire    Fear of Current or Ex-Partner: No    Emotionally Abused: No    Physically Abused: No    Sexually Abused: No     Allergies  Allergen Reactions   Lisinopril Cough     Outpatient Medications Prior to Visit  Medication Sig Dispense Refill   albuterol (PROVENTIL) (5 MG/ML) 0.5% nebulizer solution Take 0.5 mLs (2.5 mg total) by nebulization every 6 (six) hours as needed  for wheezing or shortness of breath. Please give the patient 10 1m vials. 200 mL 5   amLODipine (NORVASC) 10 MG tablet Take 1 tablet (10 mg total) by mouth daily. 90 tablet 1   anastrozole (ARIMIDEX) 1 MG tablet Take 1 tablet by mouth once daily 90 tablet 0   benzonatate (TESSALON) 200 MG capsule Take 1 capsule (200 mg total) by mouth 2 (two) times daily as needed for cough. 20 capsule 0   budesonide (PULMICORT) 0.5 MG/2ML nebulizer solution Take 2 mLs (0.5 mg total) by nebulization 2 (two) times daily. (Patient taking differently: Take 0.5 mg by nebulization 2 (two) times daily as needed (asthma).) 120 mL 11   EQ ALLERGY RELIEF, CETIRIZINE, 10 MG tablet Take 1 tablet by mouth once daily 90 tablet 0   escitalopram (LEXAPRO) 20 MG tablet Take 1 tablet (20 mg total) by mouth daily. 90 tablet 1   fluticasone (FLONASE) 50 MCG/ACT nasal spray Place 1 spray into both nostrils daily. 16 g 4   Humidifiers MISC 1 application by Tracheal Tube route as needed. 1 each 0   ipratropium (ATROVENT) 0.03 % nasal spray USE 2 SPRAY(S) IN EACH NOSTRIL EVERY 12 HOURS 30 mL 5   ipratropium-albuterol (DUONEB) 0.5-2.5 (3) MG/3ML SOLN Take 3 mLs by nebulization every 6 (six) hours. (Patient taking differently: Take 3 mLs by nebulization every 6 (six) hours as needed (asthma).) 360 mL 3   levothyroxine (EUTHYROX) 175 MCG tablet Take 1 tablet (175 mcg total) by mouth daily before breakfast. 30 tablet 2    losartan-hydrochlorothiazide (HYZAAR) 100-25 MG tablet Take 1 tablet by mouth daily. 90 tablet 1   metoprolol tartrate (LOPRESSOR) 50 MG tablet Take 1 tablet (50 mg total) by mouth 2 (two) times daily. 180 tablet 1   montelukast (SINGULAIR) 10 MG tablet Take 1 tablet (10 mg total) by mouth at bedtime. 90 tablet 1   nystatin cream (MYCOSTATIN) Apply 1 Application topically daily as needed for dry skin. 30 g 1   omeprazole (PRILOSEC) 40 MG capsule Take 1 capsule (40 mg total) by mouth 2 (two) times daily. 180 capsule 1   ondansetron (ZOFRAN) 4 MG tablet Take 1 tablet (4 mg total) by mouth every 8 (eight) hours as needed for nausea or vomiting. 20 tablet 0   sodium chloride HYPERTONIC 3 % nebulizer solution Take by nebulization 3 (three) times daily. (Patient taking differently: Take 4 mLs by nebulization as needed for cough.) 120 mL 5   No facility-administered medications prior to visit.   Review of Systems  Constitutional:  Negative for chills, fever, malaise/fatigue and weight loss.  HENT:  Negative for congestion, sinus pain and sore throat.   Eyes: Negative.   Respiratory:  Positive for cough, sputum production and shortness of breath. Negative for hemoptysis and wheezing.   Cardiovascular:  Negative for chest pain, palpitations, orthopnea, claudication and leg swelling.  Gastrointestinal:  Negative for abdominal pain, heartburn, nausea and vomiting.  Genitourinary: Negative.   Musculoskeletal:  Negative for joint pain and myalgias.  Skin:  Negative for rash.  Neurological:  Negative for weakness.  Endo/Heme/Allergies: Negative.   Psychiatric/Behavioral: Negative.      Objective:   Vitals:   07/22/22 1358  BP: 130/80  Pulse: (!) 105  SpO2: 91%  Weight: 223 lb (101.2 kg)  Height: '5\' 7"'$  (1.702 m)    Physical Exam Constitutional:      General: She is not in acute distress.    Appearance: She is not ill-appearing.  HENT:  Head: Normocephalic and atraumatic.      Mouth/Throat:     Comments: Tracheostomy in place, no erythema or secretions noted Cardiovascular:     Rate and Rhythm: Normal rate and regular rhythm.     Pulses: Normal pulses.     Heart sounds: Normal heart sounds. No murmur heard. Pulmonary:     Effort: Pulmonary effort is normal.     Breath sounds: Normal breath sounds. No wheezing, rhonchi or rales.  Musculoskeletal:     Right lower leg: No edema.     Left lower leg: No edema.  Skin:    General: Skin is warm and dry.  Neurological:     General: No focal deficit present.     Mental Status: She is alert.  Psychiatric:        Mood and Affect: Mood normal.        Behavior: Behavior normal.        Thought Content: Thought content normal.        Judgment: Judgment normal.     CBC    Component Value Date/Time   WBC 7.1 02/14/2022 0932   RBC 4.60 02/14/2022 0932   HGB 13.0 02/14/2022 0932   HGB 11.7 (L) 06/02/2018 1208   HGB 11.9 08/20/2015 1200   HCT 39.8 02/14/2022 0932   HCT 37.3 08/20/2015 1200   PLT 359 02/14/2022 0932   PLT 359 06/02/2018 1208   PLT 305 08/20/2015 1200   MCV 86.5 02/14/2022 0932   MCV 91.8 08/20/2015 1200   MCH 28.3 02/14/2022 0932   MCHC 32.7 02/14/2022 0932   RDW 13.2 02/14/2022 0932   RDW 14.4 08/20/2015 1200   LYMPHSABS 1.3 08/29/2021 0505   LYMPHSABS 1.2 08/20/2015 1200   MONOABS 0.4 08/29/2021 0505   MONOABS 0.3 08/20/2015 1200   EOSABS 0.2 08/29/2021 0505   EOSABS 0.2 08/20/2015 1200   BASOSABS 0.1 08/29/2021 0505   BASOSABS 0.1 08/20/2015 1200      Latest Ref Rng & Units 02/14/2022    9:32 AM 10/14/2021    2:40 PM 09/17/2021    9:27 AM  BMP  Glucose 70 - 99 mg/dL 114  100  171   BUN 8 - 23 mg/dL '13  31  17   '$ Creatinine 0.44 - 1.00 mg/dL 0.81  1.45  1.01   BUN/Creat Ratio 12 - '28  21  17   '$ Sodium 135 - 145 mmol/L 139  135  139   Potassium 3.5 - 5.1 mmol/L 4.1  4.0  3.8   Chloride 98 - 111 mmol/L 108  96  103   CO2 22 - 32 mmol/L '23  19  17   '$ Calcium 8.9 - 10.3 mg/dL 9.8  9.9   10.0    Chest imaging: CTA Chest 08/29/21 1. No evidence for acute pulmonary embolism. 2. Persistent complete atelectasis of the left lower lobe with hyperexpansion of the left upper lobe. Central occlusion of the left lower lobe bronchus is unchanged from previous exam. Residual, retained foreign body or endobronchial lesion not excluded. Advise referral to pulmonary medicine for further management. 3. Diffuse mosaic attenuation pattern throughout both lungs likely reflects air trapping secondary to small airways disease. 4. Hepatic steatosis. Contour the liver appears slightly irregular which may reflect early cirrhosis. 5. Unchanged appearance of borderline enlarged left axillary lymph node.  CXR 05/17/21 Persistent complete atelectasis of the left lower lobe. Lung volumes are normal. No consolidative airspace disease. No pleural effusions. No pneumothorax. No pulmonary nodule or mass  noted. Pulmonary vasculature and the cardiomediastinal silhouette are within normal limits.  CT Chest 05/02/21 1. There is an obstructing foreign body at the origin of the left mainstem bronchus, with total postobstructive atelectasis of the left lower lobe. This may be an aspirated tracheoesophageal speech valve. 2. Tracheostomy. 3. Hepatic steatosis.  PFT:     No data to display          Labs:  Path:  Echo 08/31/17: LVEF 65 to 70%.  Grade 2 diastolic dysfunction.  Normal size and function.  Heart Catheterization:  Assessment & Plan:   Moderate persistent asthma without complication - Plan: Ambulatory Referral for DME  Tracheostomy dependence ([place])  Discussion: Deborah [name] is a 67 year old woman, former smoker with laryngeal cancer s/p laryngectomy who returns to pulmonary clinic for asthma/COPD.  She is to continue budesonide 0.'5mg'$  twice daily and duonebs q6hrs.   She is to use hypertonic saline nebs for airway clearance as needed for chest congestion and mucous plugs.    I am concerned of her increased symptom burden and dyspnea since displacement of the TEP prosthesis. I have contacted Dr. Janeice Robinson office to see if she would be able to be moved up on the OR schedule since she is scheduled 2/26.   She is to continue zyrtec daily for allergies and continue montelukast '10mg'$  daily.   She is to continue fluticasone nasal spray daily and ipratropium nasal spray twice daily for her postnasal drainage.  Follow-up in 6 months.  Freda Jackson, MD [facility] Office: [phone]    Current Outpatient Medications:    albuterol (PROVENTIL) (5 MG/ML) 0.5% nebulizer solution, Take 0.5 mLs (2.5 mg total) by nebulization every 6 (six) hours as needed for wheezing or shortness of breath. Please give the patient 10 9m vials., Disp: 200 mL, Rfl: 5   amLODipine (NORVASC) 10 MG tablet, Take 1 tablet (10 mg total) by mouth daily., Disp: 90 tablet, Rfl: 1   anastrozole (ARIMIDEX) 1 MG tablet, Take 1 tablet by mouth once daily, Disp: 90 tablet, Rfl: 0   benzonatate (TESSALON) 200 MG capsule, Take 1 capsule (200 mg total) by mouth 2 (two) times daily as needed for cough., Disp: 20 capsule, Rfl: 0   budesonide (PULMICORT) 0.5 MG/2ML nebulizer solution, Take 2 mLs (0.5 mg total) by nebulization 2 (two) times daily. (Patient taking differently: Take 0.5 mg by nebulization 2 (two) times daily as needed (asthma).), Disp: 120 mL, Rfl: 11   EQ ALLERGY RELIEF, CETIRIZINE, 10 MG tablet, Take 1 tablet by mouth once daily, Disp: 90 tablet, Rfl: 0   escitalopram (LEXAPRO) 20 MG tablet, Take 1 tablet (20 mg total) by mouth daily., Disp: 90 tablet, Rfl: 1   fluticasone (FLONASE) 50 MCG/ACT nasal spray, Place 1 spray into both nostrils daily., Disp: 16 g, Rfl: 4   Humidifiers MISC, 1 application by Tracheal Tube route as needed., Disp: 1 each, Rfl: 0   ipratropium (ATROVENT) 0.03 % nasal spray, USE 2 SPRAY(S) IN EACH NOSTRIL EVERY 12 HOURS, Disp: 30 mL, Rfl: 5    ipratropium-albuterol (DUONEB) 0.5-2.5 (3) MG/3ML SOLN, Take 3 mLs by nebulization every 6 (six) hours. (Patient taking differently: Take 3 mLs by nebulization every 6 (six) hours as needed (asthma).), Disp: 360 mL, Rfl: 3   levothyroxine (EUTHYROX) 175 MCG tablet, Take 1 tablet (175 mcg total) by mouth daily before breakfast., Disp: 30 tablet, Rfl: 2   losartan-hydrochlorothiazide (HYZAAR) 100-25 MG tablet, Take 1 tablet by mouth daily., Disp: 90  tablet, Rfl: 1   metoprolol tartrate (LOPRESSOR) 50 MG tablet, Take 1 tablet (50 mg total) by mouth 2 (two) times daily., Disp: 180 tablet, Rfl: 1   montelukast (SINGULAIR) 10 MG tablet, Take 1 tablet (10 mg total) by mouth at bedtime., Disp: 90 tablet, Rfl: 1   nystatin cream (MYCOSTATIN), Apply 1 Application topically daily as needed for dry skin., Disp: 30 g, Rfl: 1   omeprazole (PRILOSEC) 40 MG capsule, Take 1 capsule (40 mg total) by mouth 2 (two) times daily., Disp: 180 capsule, Rfl: 1   ondansetron (ZOFRAN) 4 MG tablet, Take 1 tablet (4 mg total) by mouth every 8 (eight) hours as needed for nausea or vomiting., Disp: 20 tablet, Rfl: 0   sodium chloride HYPERTONIC 3 % nebulizer solution, Take by nebulization 3 (three) times daily. (Patient taking differently: Take 4 mLs by nebulization as needed for cough.), Disp: 120 mL, Rfl: 5

## 2022-07-22 NOTE — Patient Instructions (Addendum)
Continue to use duoneb nebulizer treatment every 4-6 hours during the day   Continue budesonide neblizer treatment twice daily   Continue montelukast '10mg'$  daily for allergies   Continue using the hypertonic saline nebulizer treatments (The [facility] neb treatment).    Continue on zyrtec daily for allergies   Continue on nasal sprays   We will order you new nebulizer machine supplies  Follow up in 6 months.

## 2022-07-23 ENCOUNTER — Encounter: Payer: Self-pay | Admitting: Pulmonary Disease

## 2022-07-24 DIAGNOSIS — J4540 Moderate persistent asthma, uncomplicated: Secondary | ICD-10-CM | POA: Diagnosis not present

## 2022-08-01 NOTE — Progress Notes (Signed)
Reviewed recent pulmonary notes with Dr. Lissa Hoard at [facility] and due to the patients dyspnea,cough, trouble clearing mucous patient would need to be done at [facility]. Carla at Dr. Burney Gauze office aware

## 2022-08-05 ENCOUNTER — Ambulatory Visit
Admission: RE | Admit: 2022-08-05 | Discharge: 2022-08-05 | Disposition: A | Discharge status: Home / Self Care | Payer: Medicaid Other | Source: Ambulatory Visit

## 2022-08-05 DIAGNOSIS — Z1231 Encounter for screening mammogram for malignant neoplasm of breast: Secondary | ICD-10-CM

## 2022-08-07 NOTE — Pre-Procedure Instructions (Signed)
Surgical Instructions    Your procedure is scheduled on August 11, 2022.  Report to [facility] Main Entrance "A" at 8:20 A.M., then check in with the Admitting office.  Call this number if you have problems the morning of surgery:  [phone]  If you have any questions prior to your surgery date call [phone]: Open Monday-Friday 8am-4pm If you experience any cold or flu symptoms such as cough, fever, chills, shortness of breath, etc. between now and your scheduled surgery, please notify us at the above number.     Remember:  Do not eat or drink after midnight the night before your surgery      Take these medicines the morning of surgery with A SIP OF WATER:  amLODipine (NORVASC)   CETIRIZINE   escitalopram (LEXAPRO)   fluticasone (FLONASE) nasal spray   ipratropium (ATROVENT) nasal spray   anastrozole (ARIMIDEX)   levothyroxine (EUTHYROX)   metoprolol tartrate (LOPRESSOR)   omeprazole (PRILOSEC)    May take these medicines IF NEEDED:  albuterol (PROVENTIL) (5 MG/ML) 0.5% nebulizer   benzonatate (TESSALON)   budesonide (PULMICORT) 0.5 MG/2ML nebulizer   ipratropium-albuterol (DUONEB)   ondansetron (ZOFRAN)   sodium chloride HYPERTONIC 3 % nebulizer     As of today, STOP taking any Aspirin (unless otherwise instructed by your surgeon) Aleve, Naproxen, Ibuprofen, Motrin, Advil, Goody's, BC's, all herbal medications, fish oil, and all vitamins.                     Do NOT Smoke (Tobacco/Vaping) for 24 hours prior to your procedure.  If you use a CPAP at night, you may bring your mask/headgear for your overnight stay.   Contacts, glasses, piercing's, hearing aid's, dentures or partials may not be worn into surgery, please bring cases for these belongings.    For patients admitted to the hospital, discharge time will be determined by your treatment team.   Patients discharged the day of surgery will not be allowed to drive home, and someone needs to stay with them  for 24 hours.  SURGICAL WAITING ROOM VISITATION Patients having surgery or a procedure may have no more than 2 support people in the waiting area - these visitors may rotate.   Children under the age of 43 must have an adult with them who is not the patient. If the patient needs to stay at the hospital during part of their recovery, the visitor guidelines for inpatient rooms apply. Pre-op nurse will coordinate an appropriate time for 1 support person to accompany patient in pre-op.  This support person may not rotate.   Please refer to the [facility] website for the visitor guidelines for Inpatients (after your surgery is over and you are in a regular room).    Special instructions:   [place]- Preparing For Surgery  Before surgery, you can play an important role. Because skin is not sterile, your skin needs to be as free of germs as possible. You can reduce the number of germs on your skin by washing with CHG (chlorahexidine gluconate) Soap before surgery.  CHG is an antiseptic cleaner which kills germs and bonds with the skin to continue killing germs even after washing.    Oral Hygiene is also important to reduce your risk of infection.  Remember - BRUSH YOUR TEETH THE MORNING OF SURGERY WITH YOUR REGULAR TOOTHPASTE  Please do not use if you have an allergy to CHG or antibacterial soaps. If your skin becomes reddened/irritated stop using the CHG.  Do not shave (including legs and underarms) for at least 48 hours prior to first CHG shower. It is OK to shave your face.  Please follow these instructions carefully.   Shower the NIGHT BEFORE SURGERY and the MORNING OF SURGERY  If you chose to wash your hair, wash your hair first as usual with your normal shampoo.  After you shampoo, rinse your hair and body thoroughly to remove the shampoo.  Use CHG Soap as you would any other liquid soap. You can apply CHG directly to the skin and wash gently with a scrungie or a clean washcloth.    Apply the CHG Soap to your body ONLY FROM THE NECK DOWN.  Do not use on open wounds or open sores. Avoid contact with your eyes, ears, mouth and genitals (private parts). Wash Face and genitals (private parts)  with your normal soap.   Wash thoroughly, paying special attention to the area where your surgery will be performed.  Thoroughly rinse your body with warm water from the neck down.  DO NOT shower/wash with your normal soap after using and rinsing off the CHG Soap.  Pat yourself dry with a CLEAN TOWEL.  Wear CLEAN PAJAMAS to bed the night before surgery  Place CLEAN SHEETS on your bed the night before your surgery  DO NOT SLEEP WITH PETS.   Day of Surgery: Take a shower with CHG soap. Do not wear jewelry or makeup Do not wear lotions, powders, perfumes/colognes, or deodorant. Do not shave 48 hours prior to surgery.  Men may shave face and neck. Do not bring valuables to the hospital.  [facility] is not responsible for any belongings or valuables. Do not wear nail polish, gel polish, artificial nails, or any other type of covering on natural nails (fingers and toes) If you have artificial nails or gel coating that need to be removed by a nail salon, please have this removed prior to surgery. Artificial nails or gel coating may interfere with anesthesia's ability to adequately monitor your vital signs.  Wear Clean/Comfortable clothing the morning of surgery Remember to brush your teeth WITH YOUR REGULAR TOOTHPASTE.   Please read over the following fact sheets that you were given.    If you received a COVID test during your pre-op visit  it is requested that you wear a mask when out in public, stay away from anyone that may not be feeling well and notify your surgeon if you develop symptoms. If you have been in contact with anyone that has tested positive in the last 10 days please notify you surgeon.

## 2022-08-08 ENCOUNTER — Inpatient Hospital Stay (HOSPITAL_COMMUNITY)
Admission: EM | Admit: 2022-08-08 | Discharge: 2022-08-12 | DRG: 637 | Disposition: A | Discharge status: Home / Self Care | Payer: Medicaid Other | Attending: Internal Medicine | Admitting: Internal Medicine

## 2022-08-08 ENCOUNTER — Inpatient Hospital Stay (HOSPITAL_COMMUNITY): Payer: Medicaid Other

## 2022-08-08 ENCOUNTER — Encounter (HOSPITAL_COMMUNITY)
Admission: RE | Admit: 2022-08-08 | Discharge: 2022-08-08 | Disposition: A | Discharge status: Home / Self Care | Payer: Medicaid Other | Source: Ambulatory Visit | Attending: Otolaryngology | Admitting: Otolaryngology

## 2022-08-08 ENCOUNTER — Other Ambulatory Visit: Payer: Self-pay

## 2022-08-08 ENCOUNTER — Encounter (HOSPITAL_COMMUNITY): Payer: Self-pay

## 2022-08-08 ENCOUNTER — Telehealth: Payer: Self-pay | Admitting: Family Medicine

## 2022-08-08 ENCOUNTER — Encounter (HOSPITAL_COMMUNITY): Payer: Self-pay | Admitting: Emergency Medicine

## 2022-08-08 ENCOUNTER — Emergency Department (HOSPITAL_COMMUNITY): Payer: Medicaid Other

## 2022-08-08 VITALS — BP 113/66 | HR 106 | Temp 98.5°F | Resp 17 | Ht 67.0 in | Wt 203.0 lb

## 2022-08-08 DIAGNOSIS — I5031 Acute diastolic (congestive) heart failure: Secondary | ICD-10-CM | POA: Diagnosis not present

## 2022-08-08 DIAGNOSIS — Z8249 Family history of ischemic heart disease and other diseases of the circulatory system: Secondary | ICD-10-CM

## 2022-08-08 DIAGNOSIS — I5032 Chronic diastolic (congestive) heart failure: Secondary | ICD-10-CM | POA: Diagnosis not present

## 2022-08-08 DIAGNOSIS — R0602 Shortness of breath: Secondary | ICD-10-CM | POA: Diagnosis not present

## 2022-08-08 DIAGNOSIS — F329 Major depressive disorder, single episode, unspecified: Secondary | ICD-10-CM | POA: Diagnosis present

## 2022-08-08 DIAGNOSIS — I119 Hypertensive heart disease without heart failure: Secondary | ICD-10-CM | POA: Insufficient documentation

## 2022-08-08 DIAGNOSIS — Z1152 Encounter for screening for COVID-19: Secondary | ICD-10-CM | POA: Insufficient documentation

## 2022-08-08 DIAGNOSIS — Z01818 Encounter for other preprocedural examination: Secondary | ICD-10-CM | POA: Insufficient documentation

## 2022-08-08 DIAGNOSIS — Z79899 Other long term (current) drug therapy: Secondary | ICD-10-CM

## 2022-08-08 DIAGNOSIS — K219 Gastro-esophageal reflux disease without esophagitis: Secondary | ICD-10-CM | POA: Diagnosis not present

## 2022-08-08 DIAGNOSIS — Z8521 Personal history of malignant neoplasm of larynx: Secondary | ICD-10-CM | POA: Diagnosis not present

## 2022-08-08 DIAGNOSIS — Z87891 Personal history of nicotine dependence: Secondary | ICD-10-CM | POA: Insufficient documentation

## 2022-08-08 DIAGNOSIS — E861 Hypovolemia: Secondary | ICD-10-CM | POA: Diagnosis present

## 2022-08-08 DIAGNOSIS — I110 Hypertensive heart disease with heart failure: Secondary | ICD-10-CM | POA: Diagnosis present

## 2022-08-08 DIAGNOSIS — J449 Chronic obstructive pulmonary disease, unspecified: Secondary | ICD-10-CM | POA: Diagnosis present

## 2022-08-08 DIAGNOSIS — Z930 Tracheostomy status: Secondary | ICD-10-CM | POA: Insufficient documentation

## 2022-08-08 DIAGNOSIS — I10 Essential (primary) hypertension: Secondary | ICD-10-CM | POA: Diagnosis present

## 2022-08-08 DIAGNOSIS — W44F9XA Other object of natural or organic material, entering into or through a natural orifice, initial encounter: Secondary | ICD-10-CM | POA: Diagnosis present

## 2022-08-08 DIAGNOSIS — E1110 Type 2 diabetes mellitus with ketoacidosis without coma: Principal | ICD-10-CM | POA: Diagnosis present

## 2022-08-08 DIAGNOSIS — I2510 Atherosclerotic heart disease of native coronary artery without angina pectoris: Secondary | ICD-10-CM | POA: Insufficient documentation

## 2022-08-08 DIAGNOSIS — Z923 Personal history of irradiation: Secondary | ICD-10-CM | POA: Diagnosis not present

## 2022-08-08 DIAGNOSIS — E039 Hypothyroidism, unspecified: Secondary | ICD-10-CM | POA: Diagnosis not present

## 2022-08-08 DIAGNOSIS — N179 Acute kidney failure, unspecified: Secondary | ICD-10-CM | POA: Diagnosis not present

## 2022-08-08 DIAGNOSIS — E109 Type 1 diabetes mellitus without complications: Secondary | ICD-10-CM

## 2022-08-08 DIAGNOSIS — I509 Heart failure, unspecified: Secondary | ICD-10-CM | POA: Diagnosis not present

## 2022-08-08 DIAGNOSIS — T17990A Other foreign object in respiratory tract, part unspecified in causing asphyxiation, initial encounter: Secondary | ICD-10-CM | POA: Diagnosis not present

## 2022-08-08 DIAGNOSIS — E871 Hypo-osmolality and hyponatremia: Secondary | ICD-10-CM | POA: Diagnosis not present

## 2022-08-08 DIAGNOSIS — R1011 Right upper quadrant pain: Secondary | ICD-10-CM | POA: Diagnosis not present

## 2022-08-08 DIAGNOSIS — Z9221 Personal history of antineoplastic chemotherapy: Secondary | ICD-10-CM

## 2022-08-08 DIAGNOSIS — E669 Obesity, unspecified: Secondary | ICD-10-CM | POA: Diagnosis not present

## 2022-08-08 DIAGNOSIS — Z853 Personal history of malignant neoplasm of breast: Secondary | ICD-10-CM | POA: Diagnosis not present

## 2022-08-08 DIAGNOSIS — F419 Anxiety disorder, unspecified: Secondary | ICD-10-CM | POA: Diagnosis present

## 2022-08-08 DIAGNOSIS — Z6831 Body mass index (BMI) 31.0-31.9, adult: Secondary | ICD-10-CM | POA: Diagnosis not present

## 2022-08-08 DIAGNOSIS — N3289 Other specified disorders of bladder: Secondary | ICD-10-CM | POA: Diagnosis present

## 2022-08-08 DIAGNOSIS — Z7951 Long term (current) use of inhaled steroids: Secondary | ICD-10-CM

## 2022-08-08 DIAGNOSIS — E032 Hypothyroidism due to medicaments and other exogenous substances: Secondary | ICD-10-CM | POA: Diagnosis present

## 2022-08-08 DIAGNOSIS — K222 Esophageal obstruction: Secondary | ICD-10-CM | POA: Insufficient documentation

## 2022-08-08 DIAGNOSIS — F32A Depression, unspecified: Secondary | ICD-10-CM | POA: Diagnosis present

## 2022-08-08 DIAGNOSIS — R072 Precordial pain: Secondary | ICD-10-CM | POA: Diagnosis present

## 2022-08-08 DIAGNOSIS — Z96653 Presence of artificial knee joint, bilateral: Secondary | ICD-10-CM | POA: Diagnosis present

## 2022-08-08 DIAGNOSIS — Z809 Family history of malignant neoplasm, unspecified: Secondary | ICD-10-CM

## 2022-08-08 DIAGNOSIS — Z833 Family history of diabetes mellitus: Secondary | ICD-10-CM

## 2022-08-08 DIAGNOSIS — R1310 Dysphagia, unspecified: Secondary | ICD-10-CM

## 2022-08-08 DIAGNOSIS — E1159 Type 2 diabetes mellitus with other circulatory complications: Secondary | ICD-10-CM | POA: Diagnosis present

## 2022-08-08 DIAGNOSIS — R739 Hyperglycemia, unspecified: Secondary | ICD-10-CM

## 2022-08-08 DIAGNOSIS — K760 Fatty (change of) liver, not elsewhere classified: Secondary | ICD-10-CM | POA: Diagnosis not present

## 2022-08-08 DIAGNOSIS — E1165 Type 2 diabetes mellitus with hyperglycemia: Secondary | ICD-10-CM | POA: Diagnosis not present

## 2022-08-08 DIAGNOSIS — J4489 Other specified chronic obstructive pulmonary disease: Secondary | ICD-10-CM | POA: Diagnosis present

## 2022-08-08 DIAGNOSIS — E876 Hypokalemia: Secondary | ICD-10-CM | POA: Diagnosis not present

## 2022-08-08 DIAGNOSIS — J9601 Acute respiratory failure with hypoxia: Secondary | ICD-10-CM | POA: Diagnosis present

## 2022-08-08 HISTORY — DX: Type 2 diabetes mellitus without complications: E11.9

## 2022-08-08 HISTORY — DX: Type 1 diabetes mellitus without complications: E10.9

## 2022-08-08 HISTORY — DX: Depression, unspecified: F32.A

## 2022-08-08 HISTORY — DX: Right upper quadrant pain: R10.11

## 2022-08-08 LAB — I-STAT VENOUS BLOOD GAS, ED
Acid-Base Excess: 5 mmol/L — ABNORMAL HIGH (ref 0.0–2.0)
Bicarbonate: 31.4 mmol/L — ABNORMAL HIGH (ref 20.0–28.0)
Calcium, Ion: 1.08 mmol/L — ABNORMAL LOW (ref 1.15–1.40)
HCT: 43 % (ref 36.0–46.0)
Hemoglobin: 14.6 g/dL (ref 12.0–15.0)
O2 Saturation: 100 %
Potassium: 4.3 mmol/L (ref 3.5–5.1)
Sodium: 125 mmol/L — ABNORMAL LOW (ref 135–145)
TCO2: 33 mmol/L — ABNORMAL HIGH (ref 22–32)
pCO2, Ven: 50.7 mmHg (ref 44–60)
pH, Ven: 7.4 (ref 7.25–7.43)
pO2, Ven: 185 mmHg — ABNORMAL HIGH (ref 32–45)

## 2022-08-08 LAB — BASIC METABOLIC PANEL
Anion gap: 18 — ABNORMAL HIGH (ref 5–15)
Anion gap: 17 — ABNORMAL HIGH (ref 5–15)
Anion gap: 19 — ABNORMAL HIGH (ref 5–15)
Anion gap: 15 (ref 5–15)
Anion gap: 8 (ref 5–15)
BUN: 29 mg/dL — ABNORMAL HIGH (ref 8–23)
BUN: 34 mg/dL — ABNORMAL HIGH (ref 8–23)
BUN: 34 mg/dL — ABNORMAL HIGH (ref 8–23)
BUN: 34 mg/dL — ABNORMAL HIGH (ref 8–23)
BUN: 26 mg/dL — ABNORMAL HIGH (ref 8–23)
CO2: 30 mmol/L (ref 22–32)
CO2: 26 mmol/L (ref 22–32)
CO2: 25 mmol/L (ref 22–32)
CO2: 27 mmol/L (ref 22–32)
CO2: 31 mmol/L (ref 22–32)
Calcium: 9.8 mg/dL (ref 8.9–10.3)
Calcium: 9.3 mg/dL (ref 8.9–10.3)
Calcium: 9.6 mg/dL (ref 8.9–10.3)
Calcium: 9.7 mg/dL (ref 8.9–10.3)
Calcium: 9.5 mg/dL (ref 8.9–10.3)
Chloride: 81 mmol/L — ABNORMAL LOW (ref 98–111)
Chloride: 81 mmol/L — ABNORMAL LOW (ref 98–111)
Chloride: 81 mmol/L — ABNORMAL LOW (ref 98–111)
Chloride: 87 mmol/L — ABNORMAL LOW (ref 98–111)
Chloride: 90 mmol/L — ABNORMAL LOW (ref 98–111)
Creatinine, Ser: 1.51 mg/dL — ABNORMAL HIGH (ref 0.44–1.00)
Creatinine, Ser: 1.72 mg/dL — ABNORMAL HIGH (ref 0.44–1.00)
Creatinine, Ser: 1.6 mg/dL — ABNORMAL HIGH (ref 0.44–1.00)
Creatinine, Ser: 1.38 mg/dL — ABNORMAL HIGH (ref 0.44–1.00)
Creatinine, Ser: 0.97 mg/dL (ref 0.44–1.00)
GFR, Estimated: 38 mL/min — ABNORMAL LOW (ref >60)
GFR, Estimated: 33 mL/min — ABNORMAL LOW (ref >60)
GFR, Estimated: 36 mL/min — ABNORMAL LOW (ref >60)
GFR, Estimated: 43 mL/min — ABNORMAL LOW (ref >60)
GFR, Estimated: >60 mL/min (ref >60)
Glucose, Bld: 600 mg/dL (ref 70–99)
Glucose, Bld: 772 mg/dL (ref 70–99)
Glucose, Bld: 704 mg/dL (ref 70–99)
Glucose, Bld: 494 mg/dL — ABNORMAL HIGH (ref 70–99)
Glucose, Bld: 287 mg/dL — ABNORMAL HIGH (ref 70–99)
Potassium: 3.9 mmol/L (ref 3.5–5.1)
Potassium: 4.2 mmol/L (ref 3.5–5.1)
Potassium: 4.2 mmol/L (ref 3.5–5.1)
Potassium: 3.2 mmol/L — ABNORMAL LOW (ref 3.5–5.1)
Potassium: 5.5 mmol/L — ABNORMAL HIGH (ref 3.5–5.1)
Sodium: 129 mmol/L — ABNORMAL LOW (ref 135–145)
Sodium: 124 mmol/L — ABNORMAL LOW (ref 135–145)
Sodium: 125 mmol/L — ABNORMAL LOW (ref 135–145)
Sodium: 129 mmol/L — ABNORMAL LOW (ref 135–145)
Sodium: 129 mmol/L — ABNORMAL LOW (ref 135–145)

## 2022-08-08 LAB — CBC WITH DIFFERENTIAL/PLATELET
Abs Immature Granulocytes: 0.01 10*3/uL (ref 0.00–0.07)
Basophils Absolute: 0.1 10*3/uL (ref 0.0–0.1)
Basophils Relative: 1 %
Eosinophils Absolute: 0.1 10*3/uL (ref 0.0–0.5)
Eosinophils Relative: 2 %
HCT: 41.1 % (ref 36.0–46.0)
Hemoglobin: 14.5 g/dL (ref 12.0–15.0)
Immature Granulocytes: 0 %
Lymphocytes Relative: 19 %
Lymphs Abs: 1.3 10*3/uL (ref 0.7–4.0)
MCH: 29.7 pg (ref 26.0–34.0)
MCHC: 35.3 g/dL (ref 30.0–36.0)
MCV: 84.2 fL (ref 80.0–100.0)
Monocytes Absolute: 0.6 10*3/uL (ref 0.1–1.0)
Monocytes Relative: 9 %
Neutro Abs: 4.8 10*3/uL (ref 1.7–7.7)
Neutrophils Relative %: 69 %
Platelets: 347 10*3/uL (ref 150–400)
RBC: 4.88 MIL/uL (ref 3.87–5.11)
RDW: 12.2 % (ref 11.5–15.5)
WBC: 6.8 10*3/uL (ref 4.0–10.5)
nRBC: 0 % (ref 0.0–0.2)

## 2022-08-08 LAB — PROCALCITONIN: Procalcitonin: 0.92 ng/mL

## 2022-08-08 LAB — SURGICAL PCR SCREEN
MRSA, PCR: POSITIVE — AB
Staphylococcus aureus: POSITIVE — AB

## 2022-08-08 LAB — CBG MONITORING, ED
Glucose-Capillary: >600 mg/dL (ref 70–99)
Glucose-Capillary: >600 mg/dL (ref 70–99)
Glucose-Capillary: 513 mg/dL (ref 70–99)
Glucose-Capillary: 410 mg/dL — ABNORMAL HIGH (ref 70–99)
Glucose-Capillary: 510 mg/dL (ref 70–99)
Glucose-Capillary: 357 mg/dL — ABNORMAL HIGH (ref 70–99)
Glucose-Capillary: 303 mg/dL — ABNORMAL HIGH (ref 70–99)
Glucose-Capillary: 267 mg/dL — ABNORMAL HIGH (ref 70–99)

## 2022-08-08 LAB — BETA-HYDROXYBUTYRIC ACID: Beta-Hydroxybutyric Acid: 2.85 mmol/L — ABNORMAL HIGH (ref 0.05–0.27)

## 2022-08-08 LAB — CBC
HCT: 42.9 % (ref 36.0–46.0)
Hemoglobin: 14.5 g/dL (ref 12.0–15.0)
MCH: 28.7 pg (ref 26.0–34.0)
MCHC: 33.8 g/dL (ref 30.0–36.0)
MCV: 85 fL (ref 80.0–100.0)
Platelets: 364 10*3/uL (ref 150–400)
RBC: 5.05 MIL/uL (ref 3.87–5.11)
RDW: 12.2 % (ref 11.5–15.5)
WBC: 7.8 10*3/uL (ref 4.0–10.5)
nRBC: 0 % (ref 0.0–0.2)

## 2022-08-08 LAB — LIPASE, BLOOD: Lipase: 50 U/L (ref 11–51)

## 2022-08-08 LAB — SARS CORONAVIRUS 2 (TAT 6-24 HRS): SARS Coronavirus 2: NEGATIVE

## 2022-08-08 LAB — LACTIC ACID, PLASMA
Lactic Acid, Venous: 1.8 mmol/L (ref 0.5–1.9)
Lactic Acid, Venous: 1.7 mmol/L (ref 0.5–1.9)

## 2022-08-08 LAB — TSH: TSH: <0.01 u[IU]/mL — ABNORMAL LOW (ref 0.350–4.500)

## 2022-08-08 LAB — AMYLASE: Amylase: 42 U/L (ref 28–100)

## 2022-08-08 MED ORDER — IPRATROPIUM BROMIDE 0.02 % IN SOLN
0.5000 mg | Freq: Two times a day (BID) | RESPIRATORY_TRACT | Status: DC
  Administered 2022-08-09 – 2022-08-12 (×7): 0.5 mg via RESPIRATORY_TRACT
  Filled 2022-08-08 (×6): qty 2.5

## 2022-08-08 MED ORDER — BUDESONIDE 0.5 MG/2ML IN SUSP
0.5000 mg | Freq: Two times a day (BID) | RESPIRATORY_TRACT | Status: DC | PRN
  Administered 2022-08-11: 0.5 mg via RESPIRATORY_TRACT
  Filled 2022-08-08: qty 2

## 2022-08-08 MED ORDER — INSULIN REGULAR(HUMAN) IN NACL 100-0.9 UT/100ML-% IV SOLN
INTRAVENOUS | Status: DC
  Administered 2022-08-08: 11.5 [IU]/h via INTRAVENOUS
  Filled 2022-08-08: qty 100

## 2022-08-08 MED ORDER — INSULIN ASPART 100 UNIT/ML IJ SOLN: 10.0000 [IU] | Freq: Once | INTRAMUSCULAR | Status: DC

## 2022-08-08 MED ORDER — LACTATED RINGERS IV SOLN: INTRAVENOUS | Status: DC

## 2022-08-08 MED ORDER — FLUTICASONE PROPIONATE 50 MCG/ACT NA SUSP
1.0000 | Freq: Every day | NASAL | Status: DC | PRN
  Filled 2022-08-08: qty 16

## 2022-08-08 MED ORDER — SODIUM CHLORIDE 0.9 % IV BOLUS: 1000.0000 mL | Freq: Once | INTRAVENOUS | Status: DC

## 2022-08-08 MED ORDER — PANTOPRAZOLE SODIUM 40 MG PO TBEC
80.0000 mg | DELAYED_RELEASE_TABLET | Freq: Every day | ORAL | Status: DC
  Administered 2022-08-09 – 2022-08-12 (×3): 80 mg via ORAL
  Filled 2022-08-08 (×3): qty 2

## 2022-08-08 MED ORDER — ESCITALOPRAM OXALATE 20 MG PO TABS
20.0000 mg | ORAL_TABLET | Freq: Every day | ORAL | Status: DC
  Administered 2022-08-09 – 2022-08-12 (×3): 20 mg via ORAL
  Filled 2022-08-08: qty 1
  Filled 2022-08-08: qty 2
  Filled 2022-08-08 (×2): qty 1

## 2022-08-08 MED ORDER — POTASSIUM CHLORIDE 10 MEQ/100ML IV SOLN
10.0000 meq | INTRAVENOUS | Status: DC
  Administered 2022-08-08: 10 meq via INTRAVENOUS
  Filled 2022-08-08: qty 100

## 2022-08-08 MED ORDER — AMLODIPINE BESYLATE 10 MG PO TABS
10.0000 mg | ORAL_TABLET | Freq: Every day | ORAL | Status: DC
  Administered 2022-08-09 – 2022-08-12 (×3): 10 mg via ORAL
  Filled 2022-08-08 (×2): qty 1
  Filled 2022-08-08: qty 2

## 2022-08-08 MED ORDER — IPRATROPIUM BROMIDE 0.03 % NA SOLN: 2.0000 | Freq: Two times a day (BID) | NASAL | Status: DC

## 2022-08-08 MED ORDER — POTASSIUM CHLORIDE 10 MEQ/100ML IV SOLN
10.0000 meq | INTRAVENOUS | Status: AC
  Administered 2022-08-08 (×2): 10 meq via INTRAVENOUS
  Filled 2022-08-08 (×2): qty 100

## 2022-08-08 MED ORDER — LACTATED RINGERS IV BOLUS
1800.0000 mL | Freq: Once | INTRAVENOUS | Status: AC
  Administered 2022-08-08: 1800 mL via INTRAVENOUS

## 2022-08-08 MED ORDER — METOPROLOL TARTRATE 50 MG PO TABS
50.0000 mg | ORAL_TABLET | Freq: Two times a day (BID) | ORAL | Status: DC
  Administered 2022-08-09 – 2022-08-12 (×6): 50 mg via ORAL
  Filled 2022-08-08 (×3): qty 1
  Filled 2022-08-08: qty 2
  Filled 2022-08-08 (×2): qty 1

## 2022-08-08 MED ORDER — HYDROCHLOROTHIAZIDE 25 MG PO TABS: 25.0000 mg | ORAL_TABLET | Freq: Every day | ORAL | Status: DC

## 2022-08-08 MED ORDER — ANASTROZOLE 1 MG PO TABS
1.0000 mg | ORAL_TABLET | Freq: Every day | ORAL | Status: DC
  Administered 2022-08-09 – 2022-08-12 (×3): 1 mg via ORAL
  Filled 2022-08-08 (×4): qty 1

## 2022-08-08 MED ORDER — ONDANSETRON HCL 4 MG PO TABS: 4.0000 mg | ORAL_TABLET | Freq: Three times a day (TID) | ORAL | Status: DC | PRN

## 2022-08-08 MED ORDER — IPRATROPIUM-ALBUTEROL 0.5-2.5 (3) MG/3ML IN SOLN: 3.0000 mL | Freq: Four times a day (QID) | RESPIRATORY_TRACT | Status: DC | PRN

## 2022-08-08 MED ORDER — DEXTROSE 50 % IV SOLN: 0.0000 mL | INTRAVENOUS | Status: DC | PRN

## 2022-08-08 MED ORDER — LORATADINE 10 MG PO TABS
10.0000 mg | ORAL_TABLET | Freq: Every day | ORAL | Status: DC
  Administered 2022-08-09 – 2022-08-12 (×3): 10 mg via ORAL
  Filled 2022-08-08 (×3): qty 1

## 2022-08-08 MED ORDER — MONTELUKAST SODIUM 10 MG PO TABS
10.0000 mg | ORAL_TABLET | Freq: Every day | ORAL | Status: DC
  Administered 2022-08-09 – 2022-08-11 (×3): 10 mg via ORAL
  Filled 2022-08-08 (×3): qty 1

## 2022-08-08 MED ORDER — LOSARTAN POTASSIUM 50 MG PO TABS
100.0000 mg | ORAL_TABLET | Freq: Every day | ORAL | Status: DC
  Administered 2022-08-09 – 2022-08-12 (×3): 100 mg via ORAL
  Filled 2022-08-08 (×3): qty 2

## 2022-08-08 MED ORDER — DEXTROSE IN LACTATED RINGERS 5 % IV SOLN: INTRAVENOUS | Status: AC

## 2022-08-08 MED ORDER — LOSARTAN POTASSIUM-HCTZ 100-25 MG PO TABS: 1.0000 | ORAL_TABLET | Freq: Every day | ORAL | Status: DC

## 2022-08-08 MED ORDER — ENOXAPARIN SODIUM 40 MG/0.4ML IJ SOSY
40.0000 mg | PREFILLED_SYRINGE | INTRAMUSCULAR | Status: DC
  Administered 2022-08-09 – 2022-08-11 (×4): 40 mg via SUBCUTANEOUS
  Filled 2022-08-08 (×4): qty 0.4

## 2022-08-08 MED ORDER — ALBUTEROL SULFATE (2.5 MG/3ML) 0.083% IN NEBU: 2.5000 mg | INHALATION_SOLUTION | Freq: Four times a day (QID) | RESPIRATORY_TRACT | Status: DC | PRN

## 2022-08-08 MED ORDER — INSULIN REGULAR(HUMAN) IN NACL 100-0.9 UT/100ML-% IV SOLN
INTRAVENOUS | Status: AC
  Administered 2022-08-08: 10 [IU]/h via INTRAVENOUS
  Filled 2022-08-08: qty 100

## 2022-08-08 MED ORDER — LEVOTHYROXINE SODIUM 75 MCG PO TABS
175.0000 ug | ORAL_TABLET | Freq: Every day | ORAL | Status: DC
  Administered 2022-08-09 – 2022-08-11 (×3): 175 ug via ORAL
  Filled 2022-08-08 (×3): qty 1

## 2022-08-08 MED ORDER — BENZONATATE 100 MG PO CAPS: 200.0000 mg | ORAL_CAPSULE | Freq: Two times a day (BID) | ORAL | Status: DC | PRN

## 2022-08-08 MED ORDER — SODIUM CHLORIDE 3 % IN NEBU: 4.0000 mL | INHALATION_SOLUTION | Freq: Three times a day (TID) | RESPIRATORY_TRACT | Status: AC | PRN

## 2022-08-08 MED ORDER — DEXTROSE IN LACTATED RINGERS 5 % IV SOLN: INTRAVENOUS | Status: DC

## 2022-08-08 NOTE — H&P (Signed)
History and Physical    Deborah [name] [id number] DOB: Feb 07, 1956 DOA: 08/08/2022  DOS: the patient was seen and examined on 08/08/2022  PCP: Zola Button, MD   Patient coming from: Home  I have personally briefly reviewed patient's old medical records in [facility]  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 She is scheduled for TED replacement 08/11/22 and at preop evaluatation was found to have serum glucose >600. She endorses polydypsia, polyuria, dry mouth. Other medical problems include COPD/Asthma with last OV 07/22/22, HTN, GERD, hypothyroidism, depression, breast cancer. She was referred by preop to [facility] for evaluation and treatment.    ED Course: afebrile, 100/64  hr 86  rr 22 VBG 7/44/50/185 Corrected sodium 135, cr 1.6 Procalcitonin negatrive. Lactic acid 1.7, CXR with patchy opacities bibasilar. Hyperglycemic protocol ordered by EDP. TRH called to admit for continued mgt hyperglycemia and other medical problems  Review of Systems:  Review of Systems  Constitutional:  Positive for fever. Negative for chills and weight loss.  HENT: Negative.    Eyes: Negative.   Respiratory:  Positive for cough. Negative for sputum production, shortness of breath and wheezing.   Cardiovascular: Negative.   Gastrointestinal:  Negative for abdominal pain, heartburn, nausea and vomiting.  Genitourinary:  Positive for frequency and urgency. Negative for dysuria.  Musculoskeletal: Negative.   Skin: Negative.   Neurological: Negative.   Endo/Heme/Allergies: Negative.   Psychiatric/Behavioral: Negative.      Past Medical History:  Diagnosis Date   Anemia    h/o of   Anxiety    Arthritis    in knees   Asthma    Breast cancer ([place])    Bronchitis    COPD (chronic obstructive pulmonary disease) ([place])    Depression    Diverticulosis 11/15/2012   noted on  screening colonoscopy    DOE (dyspnea on exertion) 04/28/2014   Esophageal stricture    FB bronchus    Former smoker 03/19/2011   GERD (gastroesophageal reflux disease)    Heart murmur    asymptomatic    History of laryngectomy    History of radiation therapy 11/15/18- 12/06/18   Right Breast total dose 42.56 Gy in 16 fractions.    Hx of radiation therapy 09/03/10 to 10/16/2010   supraglottic larynx   Hypertension    Hypothyroid    due to radiation   Internal hemorrhoid 11/15/2012   small, noted on screening colonoscopy    Larynx cancer ([place]) 07/31/2010   supraglotttic s/p chemo/radiation and surgical rescection.   Leukocytopenia    Nausea alone 07/28/2013   Neck pain 01/21/2012   Normal MRI 07/14/2011   negative for mestasis    Pneumonia 2012   RUQ abdominal pain 08/08/2022   On exam patient with marked tenderness to percussion and palpation RUQ. Obesity hinder palpation  PLan   Sciatica    Seizures ([place])    07/24/11 off Effexor w/o seizure   Sepsis ([place]) 08/04/2012   Sinusitis, chronic 07/20/2011   Bilateral maxillary, identified on MRI of head 07/14/11.     Tracheostomy dependent [facility])     Past Surgical History:  Procedure Laterality Date   BREAST LUMPECTOMY Right 07/06/2018   BREAST LUMPECTOMY WITH RADIOACTIVE SEED LOCALIZATION Right 07/06/2018   Procedure: RIGHT BREAST LUMPECTOMY WITH RADIOACTIVE SEED LOCALIZATION;  Surgeon: Rolm Bookbinder, MD;  Location: [place];  Service: General;  Laterality: Right;   COLONOSCOPY N/A  11/15/2012   Procedure: COLONOSCOPY;  Surgeon: Lafayette Dragon, MD;  Location: [facility];  Service: Endoscopy;  Laterality: N/A;   CRYOTHERAPY  05/11/2021   Procedure: CRYOTHERAPY;  Surgeon: Garner Nash, DO;  Location: [place] [facility];  Service: Pulmonary;;   DENTAL RESTORATION/EXTRACTION WITH X-RAY     ESOPHAGEAL DILATION N/A 09/09/2019   Procedure: ESOPHAGEAL DILATION;  Surgeon: [name], MD;  Location: [place];  Service: ENT;   Laterality: N/A;   ESOPHAGEAL DILATION N/A 10/05/2019   Procedure: ESOPHAGEAL DILATION;  Surgeon: [name], MD;  Location: [place];  Service: ENT;  Laterality: N/A;  via tracheostomy   ESOPHAGEAL DILATION N/A 02/27/2020   Procedure: ESOPHAGEAL DILATION;  Surgeon: [name], MD;  Location: [place];  Service: ENT;  Laterality: N/A;   ESOPHAGOGASTRODUODENOSCOPY (EGD) WITH PROPOFOL N/A 08/12/2018   Procedure: ESOPHAGOGASTRODUODENOSCOPY (EGD) WITH PROPOFOL;  Surgeon: Doran Stabler, MD;  Location: [place];  Service: Gastroenterology;  Laterality: N/A;   ESOPHAGOSCOPY  06/21/2012   Procedure: ESOPHAGOSCOPY;  Surgeon: [name], MD;  Location: [place];  Service: ENT;  Laterality: N/A;   ESOPHAGOSCOPY N/A 06/24/2021   Procedure: ESOPHAGOSCOPY WITH DILATATION;  Surgeon: [name], MD;  Location: [place];  Service: ENT;  Laterality: N/A;   ESOPHAGOSCOPY N/A 02/14/2022   Procedure: ESOPHAGOSCOPY WITH DILATATION;  Surgeon: [name], MD;  Location: [place];  Service: ENT;  Laterality: N/A;   ESOPHAGOSCOPY WITH DILITATION N/A 09/21/2014   Procedure: ESOPHAGOSCOPY WITH DILITATION;  Surgeon: [name], MD;  Location: [place];  Service: ENT;  Laterality: N/A;   ESOPHAGOSCOPY WITH DILITATION N/A 07/04/2016   Procedure: ESOPHAGOSCOPY WITH DILITATION;  Surgeon: [name], MD;  Location: [place];  Service: ENT;  Laterality: N/A;   ESOPHAGOSCOPY WITH DILITATION N/A 12/01/2017   Procedure: ESOPHAGOSCOPY WITH DILITATION;  Surgeon: [name], MD;  Location: [place];  Service: ENT;  Laterality: N/A;   ESOPHAGOSCOPY WITH DILITATION N/A 04/19/2018   Procedure: ESOPHAGOSCOPY WITH DILITATION;  Surgeon: [name], MD;  Location: [place];  Service: ENT;  Laterality: N/A;   ESOPHAGOSCOPY WITH DILITATION N/A 03/07/2019   Procedure: Esophagoscopy with dilatation;  Surgeon: [name], MD;  Location: [place];  Service: ENT;   Laterality: N/A;   ESOPHAGOSCOPY WITH DILITATION N/A 07/16/2020   Procedure: ESOPHAGOSCOPY WITH DILATION;  Surgeon: [name], MD;  Location: [place];  Service: ENT;  Laterality: N/A;   [place] N/A 10/08/2020   Procedure: ESOPHAGOSCOPY With Dilation;  Surgeon: [name], MD;  Location: [place];  Service: ENT;  Laterality: N/A;  21 French to 30 french   [place]  01/08/2018       FOREIGN BODY REMOVAL  05/11/2021   Procedure: FOREIGN BODY REMOVAL;  Surgeon: Garner Nash, DO;  Location: [place] [facility]COPY;  Service: Pulmonary;;   FOREIGN BODY REMOVAL BRONCHIAL  10/02/2011   Procedure: REMOVAL FOREIGN BODY BRONCHIAL;  Surgeon: Ruby Cola, MD;  [facility];  Service: ENT;  Laterality: N/A;   FOREIGN BODY REMOVAL BRONCHIAL N/A 01/08/2018   Procedure: REMOVAL FOREIGN BODY BRONCHIAL;  Surgeon: Jodi Marble, MD;  Location: [facility];  Service: ENT;  Laterality: N/A;   HEMOSTASIS CONTROL  05/11/2021   Procedure: HEMOSTASIS CONTROL;  Surgeon: Garner Nash, DO;  Location: [place];  Service: Pulmonary;;   LARYNGECTOMY     Porta cath removal     PORTACATH PLACEMENT  09/17/10  Tip in cavoatrial junction   RE-EXCISION OF BREAST CANCER,SUPERIOR MARGINS Right 07/29/2018   Procedure: RE-EXCISION OF RIGHT BREAST MEDIAL MARGINS;  Surgeon: Rolm Bookbinder, MD;  Location: [place];  Service: General;  Laterality: Right;   RIGID ESOPHAGOSCOPY N/A 03/19/2019   Procedure: FLEXIBLE ESOPHAGOSCOPY;  Surgeon: Jodi Marble, MD;  Location: [place];  Service: ENT;  Laterality: N/A;   STOMAPLASTY N/A 10/21/2016   Procedure: Zola Button;  Surgeon: [name], MD;  Location: [place];  Service: ENT;  Laterality: N/A;   TOTAL KNEE ARTHROPLASTY Right 12/12/2019   Procedure: RIGHT TOTAL KNEE ARTHROPLASTY;  Surgeon: Leandrew Koyanagi, MD;  Location: [place];  Service: Orthopedics;  Laterality: Right;   TOTAL KNEE ARTHROPLASTY Left 04/01/2021   Procedure:  LEFT TOTAL KNEE ARTHROPLASTY;  Surgeon: Leandrew Koyanagi, MD;  Location: [place];  Service: Orthopedics;  Laterality: Left;   TRACHEAL DILITATION  07/16/2011   Procedure: TRACHEAL DILITATION;  Surgeon: Beckie Salts, MD;  Location: [place];  Service: ENT;  Laterality: N/A;  dilation of tracheal stoma and replacement of stoma tube   [place] Bilateral 05/11/2021   Procedure: VIDEO BRONCHOSCOPY WITHOUT FLUORO;  Surgeon: Garner Nash, DO;  Location: [place];  Service: Pulmonary;  Laterality: Bilateral;  w/ cryotherapy, Foreign body extraction    Social Hx - married, has two children. LIves with spouse.    reports that she quit smoking about 11 years ago. Her smoking use included cigarettes. She has a 40.00 pack-year smoking history. She has never used smokeless tobacco. She reports current alcohol use. She reports that she does not use drugs.  Allergies  Allergen Reactions   Lisinopril Cough    Family History  Problem Relation Age of Onset   Heart disease Mother    Heart disease Father    Heart disease Sister    Cancer Brother        type unknown   Liver disease Maternal Grandmother    Cancer Sister        type unknown   Diabetes Sister    Breast cancer Neg Hx     Prior to Admission medications   Medication Sig Start Date End Date Taking? Authorizing Provider  albuterol (PROVENTIL) (5 MG/ML) 0.5% nebulizer solution Take 0.5 mLs (2.5 mg total) by nebulization every 6 (six) hours as needed for wheezing or shortness of breath. Please give the patient 10 74m vials. 12/11/20   OBenay Pike MD  amLODipine (NORVASC) 10 MG tablet Take 1 tablet (10 mg total) by mouth daily. 07/03/22   SZola Button MD  anastrozole (ARIMIDEX) 1 MG tablet Take 1 tablet by mouth once daily 02/11/22   GNicholas Lose MD  benzonatate (TESSALON) 200 MG capsule Take 1 capsule (200 mg total) by mouth 2 (two) times daily as needed for cough. 07/03/22   SZola Button MD  budesonide  (PULMICORT) 0.5 MG/2ML nebulizer solution Take 2 mLs (0.5 mg total) by nebulization 2 (two) times daily. Patient taking differently: Take 0.5 mg by nebulization 2 (two) times daily as needed (asthma). 09/17/21   DFreddi Starr MD  EQ ALLERGY RELIEF, CETIRIZINE, 10 MG tablet Take 1 tablet by mouth once daily Patient taking differently: Take 10 mg by mouth daily. 02/11/22   SZola Button MD  escitalopram (LEXAPRO) 20 MG tablet Take 1 tablet (20 mg total) by mouth daily. 07/03/22   SZola Button MD  fluticasone (FLONASE) 50 MCG/ACT nasal spray Place 1 spray into both nostrils daily.  Patient taking differently: Place 1 spray into both nostrils daily as needed for allergies. 07/03/22   Zola Button, MD  Humidifiers MISC 1 application by Tracheal Tube route as needed. 02/05/21   Zola Button, MD  ipratropium (ATROVENT) 0.03 % nasal spray USE 2 SPRAY(S) IN EACH NOSTRIL EVERY 12 HOURS Patient taking differently: Place 2 sprays into both nostrils 2 (two) times daily. 05/19/22   Freddi Starr, MD  ipratropium-albuterol (DUONEB) 0.5-2.5 (3) MG/3ML SOLN Take 3 mLs by nebulization every 6 (six) hours. Patient taking differently: Take 3 mLs by nebulization every 6 (six) hours as needed (asthma). 09/17/21   Freddi Starr, MD  levothyroxine (EUTHYROX) 175 MCG tablet Take 1 tablet (175 mcg total) by mouth daily before breakfast. 07/07/22   Zola Button, MD  losartan-hydrochlorothiazide (HYZAAR) 100-25 MG tablet Take 1 tablet by mouth daily. 07/03/22   Zola Button, MD  metoprolol tartrate (LOPRESSOR) 50 MG tablet Take 1 tablet (50 mg total) by mouth 2 (two) times daily. 07/03/22   Zola Button, MD  montelukast (SINGULAIR) 10 MG tablet Take 1 tablet (10 mg total) by mouth at bedtime. 07/03/22   Zola Button, MD  nystatin cream (MYCOSTATIN) Apply 1 Application topically daily as needed for dry skin. 07/03/22   Zola Button, MD  omeprazole (PRILOSEC) 40 MG capsule Take 1 capsule (40 mg total) by mouth 2 (two) times  daily. 07/03/22   Zola Button, MD  ondansetron (ZOFRAN) 4 MG tablet Take 1 tablet (4 mg total) by mouth every 8 (eight) hours as needed for nausea or vomiting. 06/03/22   Zola Button, MD  sodium chloride HYPERTONIC 3 % nebulizer solution Take by nebulization 3 (three) times daily. Patient taking differently: Take 4 mLs by nebulization as needed for cough. 09/17/21   Freddi Starr, MD    Physical Exam: Vitals:   08/08/22 1700 08/08/22 1751 08/08/22 1800 08/08/22 1801  BP: (!) 156/73 (!) 147/75 118/86   Pulse: (!) 112 98 97   Resp:  20 (!) 21   Temp:  98 F (36.7 C)    TempSrc:  Oral    SpO2: 95% 93% 95% 93%  Weight:      Height:        Physical Exam Vitals and nursing note reviewed.  Constitutional:      General: She is not in acute distress.    Appearance: She is obese. She is not ill-appearing.  HENT:     Head: Normocephalic and atraumatic.     Mouth/Throat:     Mouth: Mucous membranes are dry.     Pharynx: Oropharynx is clear.  Eyes:     Extraocular Movements: Extraocular movements intact.     Pupils: Pupils are equal, round, and reactive to light.  Neck:     Comments: Tracheostomy mid-line, open.  Cardiovascular:     Rate and Rhythm: Normal rate and regular rhythm.     Pulses: Normal pulses.     Heart sounds: Normal heart sounds.  Pulmonary:     Effort: Pulmonary effort is normal.     Breath sounds: No wheezing or rhonchi.  Abdominal:     General: Bowel sounds are normal. There is no distension.     Palpations: Abdomen is soft.     Tenderness: There is abdominal tenderness. There is guarding and rebound.     Comments: Tender to percussion and palpation RUQ with guarding on exam. No palpable mass - exam hindered by obesity  Musculoskeletal:     Cervical back: No  rigidity.  Neurological:     Mental Status: She is alert.      Labs on Admission: I have personally reviewed following labs and imaging studies  CBC: Recent Labs  Lab 08/08/22 1119  08/08/22 1400 08/08/22 1423  WBC 7.8 6.8  --   NEUTROABS  --  4.8  --   HGB 14.5 14.5 14.6  HCT 42.9 41.1 43.0  MCV 85.0 84.2  --   PLT 364 347  --    Basic Metabolic Panel: Recent Labs  Lab 08/08/22 1119 08/08/22 1400 08/08/22 1423 08/08/22 1624  NA 129* 124* 125* 125*  K 3.9 4.2 4.3 4.2  CL 81* 81*  --  81*  CO2 30 26  --  25  GLUCOSE 600* 772*  --  704*  BUN 29* 34*  --  34*  CREATININE 1.51* 1.72*  --  1.60*  CALCIUM 9.8 9.3  --  9.6   GFR: Estimated Creatinine Clearance: 41.4 mL/min (A) (by C-G formula based on SCr of 1.6 mg/dL (H)). Liver Function Tests: No results for input(s): "AST", "ALT", "ALKPHOS", "BILITOT", "PROT", "ALBUMIN" in the last 168 hours. No results for input(s): "LIPASE", "AMYLASE" in the last 168 hours. No results for input(s): "AMMONIA" in the last 168 hours. Coagulation Profile: No results for input(s): "INR", "PROTIME" in the last 168 hours. Cardiac Enzymes: No results for input(s): "CKTOTAL", "CKMB", "CKMBINDEX", "TROPONINI" in the last 168 hours. BNP (last 3 results) No results for input(s): "PROBNP" in the last 8760 hours. HbA1C: No results for input(s): "HGBA1C" in the last 72 hours. CBG: Recent Labs  Lab 08/08/22 1344 08/08/22 1754  GLUCAP >600* >600*   Lipid Profile: No results for input(s): "CHOL", "HDL", "LDLCALC", "TRIG", "CHOLHDL", "LDLDIRECT" in the last 72 hours. Thyroid Function Tests: No results for input(s): "TSH", "T4TOTAL", "FREET4", "T3FREE", "THYROIDAB" in the last 72 hours. Anemia Panel: No results for input(s): "VITAMINB12", "FOLATE", "FERRITIN", "TIBC", "IRON", "RETICCTPCT" in the last 72 hours. Urine analysis:    Component Value Date/Time   COLORURINE YELLOW 03/28/2021 1318   APPEARANCEUR HAZY (A) 03/28/2021 1318   LABSPEC 1.015 03/28/2021 1318   PHURINE 6.0 03/28/2021 1318   GLUCOSEU NEGATIVE 03/28/2021 1318   HGBUR NEGATIVE 03/28/2021 [place] 03/28/2021 1318   BILIRUBINUR negative  05/27/2012 1500   KETONESUR NEGATIVE 03/28/2021 1318   PROTEINUR NEGATIVE 03/28/2021 1318   UROBILINOGEN 0.2 05/27/2012 1500   UROBILINOGEN 0.2 11/19/2010 1331   NITRITE NEGATIVE 03/28/2021 1318   LEUKOCYTESUR NEGATIVE 03/28/2021 1318    Radiological Exams on Admission: I have personally reviewed images DG Chest 2 View  Result Date: 08/08/2022 CLINICAL DATA:  SOB EXAM: CHEST - 2 VIEW COMPARISON:  06/06/22 CXR FINDINGS: No pleural effusion. No pneumothorax. Normal cardiac and mediastinal contours. There are new patchy bibasilar opacities, right-greater-than-left. Radiographically apparent displaced rib fractures. Visualized upper is unremarkable. Vertebral body heights are maintained. IMPRESSION: New patchy bibasilar opacities, right-greater-than-left, which could represent aspiration or infection. Electronically Signed   By: Marin Roberts M.D.   On: 08/08/2022 15:11    EKG: I have personally reviewed EKG: NSR LVH w/o acute changes  Assessment/Plan Principal Problem:   DKA, type 2 (HCC) Active Problems:   Hypothyroidism   Essential hypertension   Major depressive disorder   GERD (gastroesophageal reflux disease)   COPD (chronic obstructive pulmonary disease) (HCC)   Chronic diastolic (congestive) heart failure (HCC)   RUQ abdominal pain    Assessment and Plan: * DKA, type 2 ([place]) Patient with no  prior h/o DM, no prior significant elevation in serum glucose. Patient endorses polydipsia, polyuria, dry mouth. She was found to be hyperglycemic at preop evaluation. Does use LABA/CS. Glucose was >700. B-hydroxbutyric acid 2.85.  Plan  Hyperglycemic protocol  Diet counseling  CBG monitoring AC/HS once DKA resolved with long term medication recommendations to follow  RUQ abdominal pain Patient with RUQ tnederness on exam to percussion and palpation. Obesity hinder ability to feel liver age. Some guarding with deep palpation.  Plan Lab-amylase,lipase  U/S abdomen  Chronic diastolic  (congestive) heart failure ([place]) Last echo 08/31/17 with EF 65-70%, grade II DD. Now with new opacities on CXR. Procalcitonin negative.  Plan Echo  Continue home meds  COPD (chronic obstructive pulmonary disease) ([place]) Patient with COPD/asthma in no respiratory distress.  Plan  Continue home meds  GERD (gastroesophageal reflux disease) C/o sharp intermittent substernal chest pain. Already on PPI bid.   Plan Continue PPI  Major depressive disorder Seems adjusted and doing well  Plan Continue home regimen  Essential hypertension BP on the low side but stable  Plan Continue home meds  Hypothyroidism TSH 07/03/22 <0.0005  Plan Repeat TSH if persistently low reduce synthroid to 125 mcg daily       DVT prophylaxis: Lovenox Code Status: Full Code Family Communication: called Mr Nicki Reaper - spouse. He understands DX and Tx plan  Disposition Plan: home when stable  Consults called: none  Admission status: Inpatient, Step Down Unit   Adella Hare, MD [facility] 08/08/2022, 6:21 PM

## 2022-08-08 NOTE — Assessment & Plan Note (Signed)
Patient with COPD/asthma in no respiratory distress.  Plan  Continue home meds

## 2022-08-08 NOTE — Assessment & Plan Note (Signed)
Last echo 08/31/17 with EF 65-70%, grade II DD. Now with new opacities on CXR. Procalcitonin negative.  Plan Echo  Continue home meds

## 2022-08-08 NOTE — Assessment & Plan Note (Signed)
C/o sharp intermittent substernal chest pain. Already on PPI bid.   Plan Continue PPI

## 2022-08-08 NOTE — Progress Notes (Signed)
Rt called to pt. Room For respiratory distress placed pt. On trach collar 15L and 50% suctioned and mucous plug was blocking airway Sats. Increased and pt. Is feeling better.

## 2022-08-08 NOTE — ED Triage Notes (Signed)
Pt to ED c/o hyperglycemia, pt getting pre op testing for scheduled sx on Monday for replacement of TEP prosthesis, blood sugar noted to be > 600. No known hx of DM. Difficulty clearing throat in triage, placed nonrebrether.

## 2022-08-08 NOTE — Anesthesia Preprocedure Evaluation (Addendum)
Anesthesia Evaluation  Patient identified by MRN, date of birth, ID band Patient awake    Reviewed: Allergy & Precautions, H&P , NPO status , Patient's Chart, lab work & pertinent test results  Airway Mallampati: Trach  TM Distance: >3 FB Neck ROM: Full    Dental no notable dental hx.    Pulmonary former smoker   Pulmonary exam normal breath sounds clear to auscultation       Cardiovascular hypertension, +CHF  Normal cardiovascular exam+ Valvular Problems/Murmurs  Rhythm:Regular Rate:Normal + Systolic murmurs 1. Maximal thickness assessed 2 cm (anterior PSAX imaging). No LVOT  Obstruction. Left ventricular ejection fraction, by estimation, is 70 to  75%. The left ventricle has hyperdynamic function. The left ventricle has  no regional wall motion abnormalities.   There is severe concentric left ventricular hypertrophy. Left ventricular  diastolic parameters are consistent with Grade I diastolic dysfunction  (impaired relaxation).   2. Right ventricular systolic function is hyperdynamic. The right  ventricular size is normal. There is moderately elevated pulmonary artery  systolic pressure. The estimated right ventricular systolic pressure is  0000000 mmHg.   3. A small pericardial effusion is present. The pericardial effusion is  posterior to the left ventricle.   4. The mitral valve is grossly normal. No evidence of mitral valve  regurgitation. No evidence of mitral stenosis.   5. TR best assesed in parasternal imaging; discrepant finding on apical  imaging. Tricuspid valve regurgitation is moderate at least.   6. 2D Vena Contracta 4 mm. Central aortic regurgitation. The aortic valve  is tricuspid. Aortic valve regurgitation is moderate. Aortic regurgitation  PHT measures 336 msec.   Comparison(s): Tricuspid regurgitation may have increased from 2019  (report only).   Conclusion(s)/Recommendation(s): If clinically indicated,  consider  outpatient assessment of hypertrophy ([facility] imaging vs CMR).      Neuro/Psych negative neurological ROS  negative psych ROS   GI/Hepatic negative GI ROS, Neg liver ROS,,,  Endo/Other  diabetes, Poorly ControlledHypothyroidism  In DKA upon admission to hospital. Non treated DM  Renal/GU negative Renal ROS  negative genitourinary   Musculoskeletal negative musculoskeletal ROS (+)    Abdominal   Peds negative pediatric ROS (+)  Hematology negative hematology ROS (+)   Anesthesia Other Findings   Reproductive/Obstetrics negative OB ROS                             Anesthesia Physical Anesthesia Plan  ASA: 4  Anesthesia Plan: General   Post-op Pain Management:    Induction: Intravenous  PONV Risk Score and Plan: Treatment may vary due to age or medical condition and Ondansetron  Airway Management Planned: Tracheostomy  Additional Equipment:   Intra-op Plan:   Post-operative Plan: Extubation in OR  Informed Consent: I have reviewed the patients History and Physical, chart, labs and discussed the procedure including the risks, benefits and alternatives for the proposed anesthesia with the patient or authorized representative who has indicated his/her understanding and acceptance.     Dental advisory given  Plan Discussed with: CRNA and Surgeon  Anesthesia Plan Comments: (Laryngectomy pt previously anesthetized at [facility] safely. Increase in pulmonary symptoms documented in pulm note are likely a result of prosthesis removal, which will be remedied at time of surgery. [name] requesting case be moved back to day surgery. -Finucane  08/08/22 2:05 PM: Case is currently scheduled for [place] on 08/11/22. Patient now in the ED after glucose with PAT labs >  600. No previous DM history. [name] notified. Proceeding as scheduled will depend on medical team recommendations.   Will use 6.0 ETT to intubate thru stoma)        Anesthesia Quick Evaluation

## 2022-08-08 NOTE — Progress Notes (Signed)
Main Lab called with critical Glucose of 600.  Discussed with Myra Gianotti, PA-C. Pt denied any hx of diabetes or pre-diabetes during PAT appointment. Hemoglobin A1c added to today's lab draw and RN called down to run STAT.

## 2022-08-08 NOTE — Progress Notes (Addendum)
PCP - Dr. Zola Button Cardiologist - Dr. [name] (currently seen as needed)  PPM/ICD - Denies Device Orders - n/a Rep Notified - n/a  Chest x-ray - 06/06/2022 EKG - 08/08/2022 Stress Test - 10/07/2017 ECHO - 08/31/2017 Cardiac Cath - Denies  Sleep Study - Denies CPAP - n/a  No DM  Last dose of GLP1 agonist- n/a GLP1 instructions: n/a  Blood Thinner Instructions: n/a Aspirin Instructions: n/a  NPO after midnight  COVID TEST- Yes. Result Pending.   Anesthesia review: Yes. Abnormal EKG. Also discussed with Myra Gianotti, PA-C. High blood glucose. See additional progress note.  Patient denies shortness of breath, fever, cough and chest pain at PAT appointment   All instructions explained to the patient, with a verbal understanding of the material. Patient agrees to go over the instructions while at home for a better understanding. Patient also instructed to self quarantine after being tested for COVID-19. The opportunity to ask questions was provided.

## 2022-08-08 NOTE — Telephone Encounter (Signed)
Received call to Intern Pager regarding patient who received pre-operative lab work due to a scheduled surgery on Monday (08/11/2022). OR staff related patient's lab work was abnormal and they were unable to reach [facility] office. Reviewed lab work that shower Glu >600, anion gap 18 and worsening kidney function. Patient needs further work up due to concern for developing DKA.  Attempted to call patient x3 without response. Contacted patient's husband, Luretha Rued, who related patient has been unable to speak for the past month and cannot answer the phone. Informed Mr. Nicki Reaper patient needs to be seen in the ED regarding her abnormal lab values. He states he would leave work immediately to take her to the ED. Confirmed emergent need for patient to be seen with Mr. Bary Leriche employer in HIPAA-compliant manner.   Colletta Maryland, DO

## 2022-08-08 NOTE — ED Notes (Signed)
Pt c/o difficulty breathing. Pt removed oxygen mask from tracheotomy, states she feels better without it. Pt anxious, c/o need to cough but can't. Oncoming shift nurse aware and called hospitalist. Orders given to notify respiratory.

## 2022-08-08 NOTE — Progress Notes (Signed)
Anesthesia Chart Review:  Case: [id number] Date/Time: 08/11/22 1011   Procedures:      ESOPHAGOSCOPY WITH DILATATION     TRACHEAL ESOPHAGEAL PROSTHESIS (TEP) CHANGE - ATOS TEP SET   Anesthesia type: General   Pre-op diagnosis: Esophageal stricture; History of radiation therapy   Location: [facility] / [place] OR   Surgeons: [name], MD       DISCUSSION: Patient is a 67 year old female scheduled for the above procedure. She has a history of tracheostomy with tracheoesophageal puncture (TEP) prosthesis which is a small plastic or silicone valve that fits into the opening. It became dislodged after a coughing spell on 07/11/22. She she was seen by [facility] SLP on 07/14/22, she was unable to produce TEP speech despite effort, and exam showed the fistula had closed. The SLP "probed TEP fistula with rigid 5 [place] solid probe. No TEP fistula opening was identified on the esophageal wall side. Water leak test showed no leak without stenting. Patient is unable to achieve any airflow through fistula with no TEP voicing. She does not have skill with use of electrolarynx device. Therefore, SLP provided an electronic writing board for communication until resolution is gained. SLP placed call to patient's ordering physician Dr. Flonnie Overman ENT." Above procedure now scheduled. Of note, when she had follow-up with her pulmonologist Dr. Erin Fulling on 07/22/22, he noted that since the fistula closed she has had increased cough and SOB and more difficulty with clearing secretions. She had an episode of hemoptysis. No fever, chills, or sweats. She wanted her to continue Zyrtec, montelukast, fluticasone  and ipratropium nasal sprays, as well has her nebulizer regimen (budesonide 0.'5mg'$  twice daily and duonebs q6hrs; hypertonic saline nebs for airway clearance as needed). He contacted Dr. Janeice Robinson office to see if they could move her up surgery to replace her TEP prosthesis.   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    Followed by pulmonologist Dr. Erin Fulling for asthma/COPD. Last visit as outlined above.    TEP prosthesis expulsion on 07/11/22 and needs replacement after fistula closed. When in place she has been able to cover her stoma to force air into the esophagus through the valve. She had previously used a Larlytube to cover her stoma. Information regarding prothesis brand/size has been recorded in the [facility] notes in [place], and was most recently 16 Fr [facility], 12 length, 18 FR dilator used for dilation method.   Multiple anesthesia records available for review in [facility] as needed.    PAT labs remarkable for glucose of 600. She does not have a known diabetes history. Anion gap 18. Na 129, K 3.9, CO2 30, BUN 29, Creatinine 1.51. CBC normal. A1c added. I contacted the on-call intern Dr. Jerilee Hoh for [place]. She reviewed labs as well and will make sure the patient is contacted for next steps. I notified her of surgery plans. Proceeding as scheduled will depend on events and management of hyperglycemia over the next few days.  I have also updated [name].  A1c and COVID-19 results are still in process.She is now in the ED with on-going evaluation.   VS: BP 113/66   Pulse (!) 106   Temp 36.9 C   Resp 17   Ht '5\' 7"'$  (1.702 m)   Wt 92.1 kg   SpO2 92%   BMI 31.79 kg/m     PROVIDERS: Zola Button, MD is PCP  - [name], MD is ENT ([place])- Nicholas Lose, MD is HEM-ONC - Eppie Gibson, MD is RAD-ONC - Freda Jackson, MD is pulmonologist. Previously she saw Christinia Gully, MD.  [name], MD is cardiologist. Seen by [name], [place] 10/01/17 for dyspnea evaluation. PRN f/u recommended after testing (see below). Wray Kearns, MD is GI   LABS: See DISCUSSION. (all labs ordered are listed, but only abnormal results are displayed)  Labs Reviewed  BASIC METABOLIC PANEL - Abnormal; Notable for the following components:      Result Value   Sodium 129 (*)    Chloride 81 (*)    Glucose, Bld 600 (*)    BUN 29 (*)    Creatinine, Ser 1.51 (*)    GFR, Estimated 38 (*)    Anion gap 18 (*)    All other components within normal limits  SURGICAL PCR SCREEN  SARS CORONAVIRUS 2 (TAT 6-24 HRS)  CBC  HEMOGLOBIN A1C    OTHER: Walk Tests: 08/21/17   Walked RA x one lap @ 185 stopped due to  Sob with nl sats / rapid pace  09/18/17   Walked RA  2 laps @ 185 ft each stopped due to  Sob / no desats / slow pace     IMAGES: CXR 06/06/22: FINDINGS: Unremarkable cardiac silhouette. Right lung is clear. There is left lower lobe atelectasis which is a stable finding compared to the prior study. This persistent finding over time suggests the possibility of a central obstructing lesion. No pneumothorax or pleural effusion. Normal pulmonary vasculature. IMPRESSION: Persistent left lower lobe atelectasis. The possibility of a central obstructing lesion should be considered. Lungs are otherwise clear.      EKG:  EKG 08/08/22: Normal sinus rhythm Possible Left atrial enlargement Left ventricular hypertrophy ( R in aVL , Sokolow-Lyon , Cornell product , Romhilt-Estes ) - She has had T wave inversion on high lateral leads on previous EKG tracings.  EKG 05/11/21: Normal sinus rhythm Possible Left atrial  enlargement Cannot rule out Anterior infarct , age undetermined ST & T wave abnormality, consider inferolateral ischemia Abnormal ECG no significant change when compared to prior on Aug 2022 Confirmed by Madalyn Rob [phone]) on 05/11/2021 9:01:37 AM - Baseline wanderer present     CV: Nuclear stress test 10/07/17: Nuclear stress EF: 61%. There was no ST segment deviation noted during stress. Defect 1: There is a medium defect of moderate severity present in the mid anteroseptal and apex location. The study is normal. This is a low risk study. The left ventricular ejection fraction is normal (55-65%).  Normal stress nuclear study with breast attenuation but no ischemia; EF 61 with normal wall motion.   Echo 08/31/17: Study Conclusions - Left ventricle: The cavity size was mildly reduced. Wall   thickness was increased in a pattern of moderate LVH. Systolic   function was vigorous. The estimated ejection fraction was in the   range of 65% to 70%. Wall motion was normal; there were no   regional wall motion abnormalities. Features are consistent with   a pseudonormal  left ventricular filling pattern, with concomitant   abnormal relaxation and increased filling pressure (grade 2   diastolic dysfunction). - Aortic valve: There was mild regurgitation. - Pulmonary arteries: Systolic pressure was mildly increased. PA   peak pressure: 35 mm Hg (S).   Past Medical History:  Diagnosis Date   Anemia    h/o of   Anxiety    Arthritis    in knees   Asthma    Breast cancer ([place])    Bronchitis    COPD (chronic obstructive pulmonary disease) ([place])    Depression    Diverticulosis 11/15/2012   noted on screening colonoscopy    DOE (dyspnea on exertion) 04/28/2014   Esophageal stricture    FB bronchus    Former smoker 03/19/2011   GERD (gastroesophageal reflux disease)    Heart murmur    asymptomatic    History of laryngectomy    History of radiation therapy 11/15/18- 12/06/18    Right Breast total dose 42.56 Gy in 16 fractions.    Hx of radiation therapy 09/03/10 to 10/16/2010   supraglottic larynx   Hypertension    Hypothyroid    due to radiation   Internal hemorrhoid 11/15/2012   small, noted on screening colonoscopy    Larynx cancer ([place]) 07/31/2010   supraglotttic s/p chemo/radiation and surgical rescection.   Leukocytopenia    Nausea alone 07/28/2013   Neck pain 01/21/2012   Normal MRI 07/14/2011   negative for mestasis    Pneumonia 2012   Sciatica    Seizures ([place])    07/24/11 off Effexor w/o seizure   Sepsis ([place]) 08/04/2012   Sinusitis, chronic 07/20/2011   Bilateral maxillary, identified on MRI of head 07/14/11.     Tracheostomy dependent [facility])     Past Surgical History:  Procedure Laterality Date   BREAST LUMPECTOMY Right 07/06/2018   BREAST LUMPECTOMY WITH RADIOACTIVE SEED LOCALIZATION Right 07/06/2018   Procedure: RIGHT BREAST LUMPECTOMY WITH RADIOACTIVE SEED LOCALIZATION;  Surgeon: Rolm Bookbinder, MD;  Location: [place];  Service: General;  Laterality: Right;   COLONOSCOPY N/A 11/15/2012   Procedure: COLONOSCOPY;  Surgeon: Lafayette Dragon, MD;  [facility];  Service: Endoscopy;  Laterality: N/A;   CRYOTHERAPY  05/11/2021   Procedure: CRYOTHERAPY;  Surgeon: Garner Nash, DO;  Location: [place] [facility];  Service: Pulmonary;;   DENTAL RESTORATION/EXTRACTION WITH X-RAY     ESOPHAGEAL DILATION N/A 09/09/2019   Procedure: ESOPHAGEAL DILATION;  Surgeon: [name], MD;  Location: [place];  Service: ENT;  Laterality: N/A;   ESOPHAGEAL DILATION N/A 10/05/2019   Procedure: ESOPHAGEAL DILATION;  Surgeon: [name], MD;  Location: [place];  Service: ENT;  Laterality: N/A;  via tracheostomy   ESOPHAGEAL DILATION N/A 02/27/2020   Procedure: ESOPHAGEAL DILATION;  Surgeon: [name], MD;  Location: [place];  Service: ENT;  Laterality: N/A;   ESOPHAGOGASTRODUODENOSCOPY (EGD) WITH PROPOFOL N/A  08/12/2018   Procedure: ESOPHAGOGASTRODUODENOSCOPY (EGD) WITH PROPOFOL;  Surgeon: Doran Stabler, MD;  Location: [place];  Service: Gastroenterology;  Laterality: N/A;   ESOPHAGOSCOPY  06/21/2012   Procedure: ESOPHAGOSCOPY;  Surgeon: [name], MD;  Location: [place];  Service: ENT;  Laterality: N/A;   ESOPHAGOSCOPY N/A 06/24/2021   Procedure: ESOPHAGOSCOPY WITH DILATATION;  Surgeon: [name], MD;  Location: [place];  Service: ENT;  Laterality: N/A;   ESOPHAGOSCOPY N/A 02/14/2022   Procedure: ESOPHAGOSCOPY WITH DILATATION;  Surgeon: [name], MD;  [facility]  OR;  Service: ENT;  Laterality: N/A;   ESOPHAGOSCOPY WITH DILITATION N/A 09/21/2014   Procedure: ESOPHAGOSCOPY WITH DILITATION;  Surgeon: [name], MD;  Location: [place];  Service: ENT;  Laterality: N/A;   ESOPHAGOSCOPY WITH DILITATION N/A 07/04/2016   Procedure: ESOPHAGOSCOPY WITH DILITATION;  Surgeon: [name], MD;  Location: [place];  Service: ENT;  Laterality: N/A;   ESOPHAGOSCOPY WITH DILITATION N/A 12/01/2017   Procedure: ESOPHAGOSCOPY WITH DILITATION;  Surgeon: [name], MD;  Location: [place];  Service: ENT;  Laterality: N/A;   ESOPHAGOSCOPY WITH DILITATION N/A 04/19/2018   Procedure: ESOPHAGOSCOPY WITH DILITATION;  Surgeon: [name], MD;  Location: [place];  Service: ENT;  Laterality: N/A;   ESOPHAGOSCOPY WITH DILITATION N/A 03/07/2019   Procedure: Esophagoscopy with dilatation;  Surgeon: [name], MD;  Location: [place];  Service: ENT;  Laterality: N/A;   ESOPHAGOSCOPY WITH DILITATION N/A 07/16/2020   Procedure: ESOPHAGOSCOPY WITH DILATION;  Surgeon: [name], MD;  Location: [place];  Service: ENT;  Laterality: N/A;   [place] N/A 10/08/2020   Procedure: ESOPHAGOSCOPY With Dilation;  Surgeon: [name], MD;  Location: [place];  Service: ENT;  Laterality: N/A;  21 [place] to [place]  01/08/2018       FOREIGN BODY REMOVAL  05/11/2021   Procedure: FOREIGN BODY REMOVAL;  Surgeon: Garner Nash, DO;  Location: [place];  Service: Pulmonary;;   FOREIGN BODY REMOVAL BRONCHIAL  10/02/2011   Procedure: REMOVAL FOREIGN BODY BRONCHIAL;  Surgeon: Ruby Cola, MD;  Location: [place];  Service: ENT;  Laterality: N/A;   FOREIGN BODY REMOVAL BRONCHIAL N/A 01/08/2018   Procedure: REMOVAL FOREIGN BODY BRONCHIAL;  Surgeon: Jodi Marble, MD;  [facility];  Service: ENT;  Laterality: N/A;   HEMOSTASIS CONTROL  05/11/2021   Procedure: HEMOSTASIS CONTROL;  Surgeon: Garner Nash, DO;  Location: [place];  Service: Pulmonary;;   LARYNGECTOMY     Porta cath removal     PORTACATH PLACEMENT  09/17/10   Tip in cavoatrial junction   RE-EXCISION OF BREAST CANCER,SUPERIOR MARGINS Right 07/29/2018   Procedure: RE-EXCISION OF RIGHT BREAST MEDIAL MARGINS;  Surgeon: Rolm Bookbinder, MD;  Location: [place];  Service: General;  Laterality: Right;   RIGID ESOPHAGOSCOPY N/A 03/19/2019   Procedure: FLEXIBLE ESOPHAGOSCOPY;  Surgeon: Jodi Marble, MD;  Location: [place];  Service: ENT;  Laterality: N/A;   STOMAPLASTY N/A 10/21/2016   Procedure: Zola Button;  Surgeon: [name], MD;  Location: [place];  Service: ENT;  Laterality: N/A;   TOTAL KNEE ARTHROPLASTY Right 12/12/2019   Procedure: RIGHT TOTAL KNEE ARTHROPLASTY;  Surgeon: Leandrew Koyanagi, MD;  Location: [place];  Service: Orthopedics;  Laterality: Right;   TOTAL KNEE ARTHROPLASTY Left 04/01/2021   Procedure: LEFT TOTAL KNEE ARTHROPLASTY;  Surgeon: Leandrew Koyanagi, MD;  Location: [place];  Service: Orthopedics;  Laterality: Left;   TRACHEAL DILITATION  07/16/2011   Procedure: TRACHEAL DILITATION;  Surgeon: Beckie Salts, MD;  Location: [place];  Service: ENT;  Laterality: N/A;  dilation of tracheal stoma and replacement of stoma tube   [place] Bilateral 05/11/2021   Procedure: VIDEO BRONCHOSCOPY  WITHOUT FLUORO;  Surgeon: Garner Nash, DO;  Location: [place];  Service: Pulmonary;  Laterality: Bilateral;  w/ cryotherapy, Foreign body extraction    MEDICATIONS:  albuterol (PROVENTIL) (5 MG/ML) 0.5% nebulizer solution   amLODipine (NORVASC) 10 MG  tablet   anastrozole (ARIMIDEX) 1 MG tablet   benzonatate (TESSALON) 200 MG capsule   budesonide (PULMICORT) 0.5 MG/2ML nebulizer solution   EQ ALLERGY RELIEF, CETIRIZINE, 10 MG tablet   escitalopram (LEXAPRO) 20 MG tablet   fluticasone (FLONASE) 50 MCG/ACT nasal spray   Humidifiers MISC   ipratropium (ATROVENT) 0.03 % nasal spray   ipratropium-albuterol (DUONEB) 0.5-2.5 (3) MG/3ML SOLN   levothyroxine (EUTHYROX) 175 MCG tablet   losartan-hydrochlorothiazide (HYZAAR) 100-25 MG tablet   metoprolol tartrate (LOPRESSOR) 50 MG tablet   montelukast (SINGULAIR) 10 MG tablet   nystatin cream (MYCOSTATIN)   omeprazole (PRILOSEC) 40 MG capsule   ondansetron (ZOFRAN) 4 MG tablet   sodium chloride HYPERTONIC 3 % nebulizer solution   Tiotropium Bromide Monohydrate 2.5 MCG/ACT AERS   No current facility-administered medications for this encounter.    Myra Gianotti, PA-C Surgical Short Stay/Anesthesiology [facility] Phone [phone] [facility] Phone [phone] 08/08/2022 2:05 PM

## 2022-08-08 NOTE — Assessment & Plan Note (Signed)
Patient with no prior h/o DM, no prior significant elevation in serum glucose. Patient endorses polydipsia, polyuria, dry mouth. She was found to be hyperglycemic at preop evaluation. Does use LABA/CS. Glucose was >700. B-hydroxbutyric acid 2.85.  Plan  Hyperglycemic protocol  Diet counseling  CBG monitoring AC/HS once DKA resolved with long term medication recommendations to follow

## 2022-08-08 NOTE — Subjective & Objective (Signed)
Deborah [name] a 67 y/o with h/o laryngeal cancer s/p larngectomy with permanent trach - recently expelled TEP with increased difficulty clearing her secretions. She is scheduled for TED replacement 08/11/22 and at preop evaluatation was found to have serum glucose >600. She endorses polydypsia, polyuria, dry mouth. Other medical problems include COPD/Asthma with last OV 07/22/22, HTN, GERD, hypothyroidism, depression, breast cancer. She was referred by preop to [facility] for evaluation and treatment.

## 2022-08-08 NOTE — Assessment & Plan Note (Signed)
TSH 07/03/22 <0.0005  Plan Repeat TSH if persistently low reduce synthroid to 125 mcg daily

## 2022-08-08 NOTE — ED Provider Notes (Signed)
[name]   CSN: [id number] Arrival date & time: 08/08/22  1334     History {Add pertinent medical, surgical, social history, OB history to HPI:1} Chief Complaint  Patient presents with   Hyperglycemia    Deborah [name] is a 67 y.o. female.  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  She is awaiting a TEP prosthesis replacement by [name] surgery soon.  She is complaining of 2 weeks of increased thirst and frequent urination.  She had blood work done which found her blood sugar to be significantly elevated and she was recommended to come to the emergency department for further evaluation.  She is not a diabetic.  She is on inhalational steroids for her breathing.  The history is provided by the patient and the spouse.  Hyperglycemia Blood sugar level PTA:  >600 Severity:  Severe Progression:  Unchanged Chronicity:  New Diabetes status:  Non-diabetic Relieved by:  None tried Ineffective treatments:  None tried Associated symptoms: dehydration, increased thirst and polyuria   Associated symptoms: no abdominal pain, no chest pain, no fever and no vomiting   Risk factors: recent steroid use        Home Medications Prior to Admission medications   Medication Sig Start Date End Date Taking? Authorizing Provider  albuterol (PROVENTIL) (5 MG/ML) 0.5% nebulizer solution Take 0.5 mLs (2.5 mg total) by nebulization every 6 (six) hours as needed for wheezing or shortness of breath. Please give the patient 10 65m vials. 12/11/20   OBenay Pike MD  amLODipine (NORVASC) 10 MG tablet Take 1 tablet (10 mg total) by mouth daily. 07/03/22   SZola Button MD  anastrozole (ARIMIDEX) 1 MG tablet Take 1 tablet by mouth once daily 02/11/22   GNicholas Lose MD  benzonatate (TESSALON) 200 MG capsule Take 1 capsule (200 mg total) by mouth 2 (two) times daily as needed for cough. 07/03/22   SZola Button MD  budesonide  (PULMICORT) 0.5 MG/2ML nebulizer solution Take 2 mLs (0.5 mg total) by nebulization 2 (two) times daily. Patient taking differently: Take 0.5 mg by nebulization 2 (two) times daily as needed (asthma). 09/17/21   DFreddi Starr MD  EQ ALLERGY RELIEF, CETIRIZINE, 10 MG tablet Take 1 tablet by mouth once daily Patient taking differently: Take 10 mg by mouth daily. 02/11/22   SZola Button MD  escitalopram (LEXAPRO) 20 MG tablet Take 1 tablet (20 mg total) by mouth daily. 07/03/22   SZola Button MD  fluticasone (FLONASE) 50 MCG/ACT nasal spray Place 1 spray into both nostrils daily. Patient taking differently: Place 1 spray into both nostrils daily as needed for allergies. 07/03/22   SZola Button MD  Humidifiers MISC 1 application by Tracheal Tube route as needed. 02/05/21   SZola Button MD  ipratropium (ATROVENT) 0.03 % nasal spray USE 2 SPRAY(S) IN EACH NOSTRIL EVERY 12 HOURS Patient taking differently: Place 2 sprays into both nostrils 2 (two) times daily. 05/19/22   DFreddi Starr MD  ipratropium-albuterol (DUONEB) 0.5-2.5 (3) MG/3ML SOLN Take 3 mLs by nebulization every 6 (six) hours. Patient taking differently: Take 3 mLs by nebulization every 6 (six) hours as needed (asthma). 09/17/21   DFreddi Starr MD  levothyroxine (EUTHYROX) 175 MCG tablet Take 1 tablet (175 mcg total) by mouth daily before breakfast. 07/07/22   SZola Button MD  losartan-hydrochlorothiazide (HYZAAR) 100-25 MG tablet Take 1 tablet by mouth daily. 07/03/22   SZola Button MD  metoprolol tartrate (LOPRESSOR) 50 MG tablet Take 1 tablet (50 mg total) by mouth 2 (two) times daily. 07/03/22   Zola Button, MD  montelukast (SINGULAIR) 10 MG tablet Take 1 tablet (10 mg total) by mouth at bedtime. 07/03/22   Zola Button, MD  nystatin cream (MYCOSTATIN) Apply 1 Application topically daily as needed for dry skin. 07/03/22   Zola Button, MD  omeprazole (PRILOSEC) 40 MG capsule Take 1 capsule (40 mg total) by mouth 2 (two) times  daily. 07/03/22   Zola Button, MD  ondansetron (ZOFRAN) 4 MG tablet Take 1 tablet (4 mg total) by mouth every 8 (eight) hours as needed for nausea or vomiting. 06/03/22   Zola Button, MD  sodium chloride HYPERTONIC 3 % nebulizer solution Take by nebulization 3 (three) times daily. Patient taking differently: Take 4 mLs by nebulization as needed for cough. 09/17/21   Freddi Starr, MD      Allergies    Lisinopril    Review of Systems   Review of Systems  Constitutional:  Negative for fever.  Cardiovascular:  Negative for chest pain.  Gastrointestinal:  Negative for abdominal pain and vomiting.  Endocrine: Positive for polydipsia and polyuria.    Physical Exam Updated Vital Signs BP 100/64 (BP Location: Right Arm)   Pulse 96   Temp 98.4 F (36.9 C) (Oral)   Resp (!) 22   Ht '5\' 7"'$  (1.702 m)   Wt 92.1 kg   SpO2 90%   BMI 31.79 kg/m  Physical Exam Vitals and nursing note reviewed.  Constitutional:      General: She is not in acute distress.    Appearance: Normal appearance. She is well-developed.  HENT:     Head: Normocephalic and atraumatic.  Eyes:     Conjunctiva/sclera: Conjunctivae normal.  Cardiovascular:     Rate and Rhythm: Normal rate and regular rhythm.     Heart sounds: No murmur heard. Pulmonary:     Effort: Pulmonary effort is normal. No respiratory distress.     Breath sounds: Normal breath sounds. No stridor. No wheezing.  Abdominal:     Palpations: Abdomen is soft.     Tenderness: There is no abdominal tenderness. There is no guarding or rebound.  Musculoskeletal:        General: No tenderness or deformity. Normal range of motion.     Cervical back: Neck supple.  Skin:    General: Skin is warm and dry.  Neurological:     General: No focal deficit present.     Mental Status: She is alert.     GCS: GCS eye subscore is 4. GCS verbal subscore is 5. GCS motor subscore is 6.     ED Results / Procedures / Treatments   Labs (all labs ordered are  listed, but only abnormal results are displayed) Labs Reviewed  BASIC METABOLIC PANEL - Abnormal; Notable for the following components:      Result Value   Sodium 124 (*)    Chloride 81 (*)    Glucose, Bld 772 (*)    BUN 34 (*)    Creatinine, Ser 1.72 (*)    GFR, Estimated 33 (*)    Anion gap 17 (*)    All other components within normal limits  BETA-HYDROXYBUTYRIC ACID - Abnormal; Notable for the following components:   Beta-Hydroxybutyric Acid 2.85 (*)    All other components within normal limits  CBG MONITORING, ED - Abnormal; Notable for the following components:   Glucose-Capillary >600 (*)  All other components within normal limits  I-STAT VENOUS BLOOD GAS, ED - Abnormal; Notable for the following components:   pO2, Ven 185 (*)    Bicarbonate 31.4 (*)    TCO2 33 (*)    Acid-Base Excess 5.0 (*)    Sodium 125 (*)    Calcium, Ion 1.08 (*)    All other components within normal limits  CBC WITH DIFFERENTIAL/PLATELET  LACTIC ACID, PLASMA  URINALYSIS, ROUTINE W REFLEX MICROSCOPIC  LACTIC ACID, PLASMA    EKG None  Radiology DG Chest 2 View  Result Date: 08/08/2022 CLINICAL DATA:  SOB EXAM: CHEST - 2 VIEW COMPARISON:  06/06/22 CXR FINDINGS: No pleural effusion. No pneumothorax. Normal cardiac and mediastinal contours. There are new patchy bibasilar opacities, right-greater-than-left. Radiographically apparent displaced rib fractures. Visualized upper is unremarkable. Vertebral body heights are maintained. IMPRESSION: New patchy bibasilar opacities, right-greater-than-left, which could represent aspiration or infection. Electronically Signed   By: Marin Roberts M.D.   On: 08/08/2022 15:11    Procedures Procedures  {Document cardiac monitor, telemetry assessment procedure when appropriate:1}  Medications Ordered in ED Medications  sodium chloride 0.9 % bolus 1,000 mL (has no administration in time range)  insulin aspart (novoLOG) injection 10 Units (has no administration in  time range)    ED Course/ Medical Decision Making/ A&P   {   Click here for ABCD2, HEART and other calculatorsREFRESH Note before signing :1}                          Medical Decision Making Risk Prescription drug management.   This patient complains of ***; this involves an extensive number of treatment Options and is a complaint that carries with it a high risk of complications and morbidity. The differential includes ***  I ordered, reviewed and interpreted labs, which included *** I ordered medication *** and reviewed PMP when indicated. I ordered imaging studies which included *** and I independently    visualized and interpreted imaging which showed *** Additional history obtained from *** Previous records obtained and reviewed *** I consulted *** and discussed lab and imaging findings and discussed disposition.  Cardiac monitoring reviewed, *** Social determinants considered, *** Critical Interventions: ***  After the interventions stated above, I reevaluated the patient and found *** Admission and further testing considered, ***   {Document critical care time when appropriate:1} {Document review of labs and clinical decision tools ie heart score, Chads2Vasc2 etc:1}  {Document your independent review of radiology images, and any outside records:1} {Document your discussion with family members, caretakers, and with consultants:1} {Document social determinants of health affecting pt's care:1} {Document your decision making why or why not admission, treatments were needed:1} Final Clinical Impression(s) / ED Diagnoses Final diagnoses:  None    Rx / DC Orders ED Discharge Orders     None

## 2022-08-08 NOTE — Inpatient Diabetes Management (Signed)
Inpatient Diabetes Program Recommendations  AACE/ADA: New Consensus Statement on Inpatient Glycemic Control (2015)  Target Ranges:  Prepandial:   less than 140 mg/dL      Peak postprandial:   less than 180 mg/dL (1-2 hours)      Critically ill patients:  140 - 180 mg/dL   Lab Results  Component Value Date   GLUCAP >600 ([place]) 08/08/2022   HGBA1C 5.5 12/11/2020    Review of Glycemic Control  Latest Reference Range & Units 08/08/22 14:00  Glucose 70 - 99 mg/dL 772 (HH)  (HH): Data is critically high  Diabetes history: New onset? Outpatient Diabetes medications: none Current orders for Inpatient glycemic control: Novolog 10 units x 1   Inpatient Diabetes Program Recommendations:    A1C in process.   Spoke with patient regarding hyperglycemia.  Explained what a A1c is and what it measures. Anticipate result to be elevated. Discussed with patient current pending status. Also reviewed goal A1c with patient, importance of good glucose control @ home, and blood sugar goals. Reviewed basic patho of DM, role of pancreas, survival skills, interventions, differences between long acting vs short acting insulin vs 70/30 insulin, vascular changes and commorbidities.   Permission granted to discuss with husband further if necessary by phone.  Encouraged that in the event patient to require insulin, to begin practicing self injection and checking blood sugars.  Patient is familiar with blood sugar checks and has used vial/syringe in the past on family members.   In the event patient is to require insulin, adamant about using least amount of injections per day as possible.   Patient admits to large amounts of sugary beverages. Reviewed alternatives, importance of protein, plate method, foods that were higher in CHO and importance of being mindful. No questions at this time.  Will await diagnosis for further interventions.   Thanks, Bronson Curb, MSN, RNC-OB Diabetes Coordinator [phone]  (8a-5p)

## 2022-08-08 NOTE — Assessment & Plan Note (Signed)
Patient with RUQ tnederness on exam to percussion and palpation. Obesity hinder ability to feel liver age. Some guarding with deep palpation.  Plan Lab-amylase,lipase  U/S abdomen

## 2022-08-08 NOTE — Assessment & Plan Note (Signed)
BP on the low side but stable  Plan Continue home meds

## 2022-08-08 NOTE — Assessment & Plan Note (Signed)
>>  ASSESSMENT AND PLAN FOR KETOSIS-PRONE DIABETES MELLITUS (HCC) WRITTEN ON 08/08/2022  6:05 PM BY NORINS, MICHAEL E, MD  Patient with no prior h/o DM, no prior significant elevation in serum glucose. Patient endorses polydipsia, polyuria, dry mouth. She was found to be hyperglycemic at preop evaluation. Does use LABA/CS. Glucose was >700. B-hydroxbutyric acid 2.85.  Plan  Hyperglycemic protocol  Diet counseling  CBG monitoring AC/HS once DKA resolved with long term medication recommendations to follow

## 2022-08-08 NOTE — Assessment & Plan Note (Signed)
Seems adjusted and doing well  Plan Continue home regimen

## 2022-08-08 NOTE — ED Provider Triage Note (Signed)
Emergency Medicine Provider Triage Evaluation Note  Deborah [name] , a 67 y.o. female  was evaluated in triage.  Pt complains of Hyperglycemia, with anion gap, blood sugar greater than 600, anion gap 18 at outpatient labs.  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  Review of Systems  Positive: hyperglycemia Negative: Fever, chills  Physical Exam  BP 100/64 (BP Location: Right Arm)   Pulse 96   Temp 98.4 F (36.9 C) (Oral)   Resp (!) 22   Ht '5\' 7"'$  (1.702 m)   Wt 92.1 kg   SpO2 90%   BMI 31.79 kg/m  Gen:   Awake, no distress   Resp:  Normal effort  MSK:   Moves extremities without difficulty  Other:  Patient with open tracheostomy in neck, she has some coughing of secretions in triage, but is in no respiratory distress on my exam  Medical Decision Making  Medically screening exam initiated at 1:53 PM.  Appropriate orders placed.  Deborah [name] was informed that the remainder of the evaluation will be completed by another provider, this initial triage assessment does not replace that evaluation, and the importance of remaining in the ED until their evaluation is complete.  Workup initiated in triage    Anselmo Pickler, [place] 08/08/22 1355

## 2022-08-08 NOTE — Progress Notes (Signed)
Patient was seen in PAT today.  Surgical pcr POSITIVE for MRSA.   Secure message sent to [name] notifying him of result.

## 2022-08-09 ENCOUNTER — Inpatient Hospital Stay (HOSPITAL_COMMUNITY): Payer: Medicaid Other

## 2022-08-09 ENCOUNTER — Other Ambulatory Visit (HOSPITAL_COMMUNITY): Payer: Medicaid Other

## 2022-08-09 DIAGNOSIS — J9601 Acute respiratory failure with hypoxia: Secondary | ICD-10-CM | POA: Diagnosis not present

## 2022-08-09 DIAGNOSIS — R739 Hyperglycemia, unspecified: Secondary | ICD-10-CM | POA: Diagnosis not present

## 2022-08-09 DIAGNOSIS — I5031 Acute diastolic (congestive) heart failure: Secondary | ICD-10-CM

## 2022-08-09 DIAGNOSIS — I10 Essential (primary) hypertension: Secondary | ICD-10-CM | POA: Diagnosis not present

## 2022-08-09 DIAGNOSIS — E876 Hypokalemia: Secondary | ICD-10-CM

## 2022-08-09 DIAGNOSIS — E1110 Type 2 diabetes mellitus with ketoacidosis without coma: Secondary | ICD-10-CM | POA: Diagnosis not present

## 2022-08-09 DIAGNOSIS — E871 Hypo-osmolality and hyponatremia: Secondary | ICD-10-CM | POA: Diagnosis not present

## 2022-08-09 DIAGNOSIS — N1330 Unspecified hydronephrosis: Secondary | ICD-10-CM | POA: Diagnosis not present

## 2022-08-09 DIAGNOSIS — R109 Unspecified abdominal pain: Secondary | ICD-10-CM | POA: Diagnosis not present

## 2022-08-09 LAB — URINALYSIS, ROUTINE W REFLEX MICROSCOPIC
Bilirubin Urine: NEGATIVE
Glucose, UA: >=500 mg/dL — AB
Hgb urine dipstick: NEGATIVE
Ketones, ur: 5 mg/dL — AB
Leukocytes,Ua: NEGATIVE
Nitrite: NEGATIVE
Protein, ur: NEGATIVE mg/dL
Specific Gravity, Urine: 1.015 (ref 1.005–1.030)
pH: 6 (ref 5.0–8.0)

## 2022-08-09 LAB — BASIC METABOLIC PANEL WITH GFR
Anion gap: 12 (ref 5–15)
BUN: 22 mg/dL (ref 8–23)
CO2: 31 mmol/L (ref 22–32)
Calcium: 9.8 mg/dL (ref 8.9–10.3)
Chloride: 90 mmol/L — ABNORMAL LOW (ref 98–111)
Creatinine, Ser: 0.88 mg/dL (ref 0.44–1.00)
GFR, Estimated: >60 mL/min
Glucose, Bld: 176 mg/dL — ABNORMAL HIGH (ref 70–99)
Potassium: 3.3 mmol/L — ABNORMAL LOW (ref 3.5–5.1)
Sodium: 133 mmol/L — ABNORMAL LOW (ref 135–145)

## 2022-08-09 LAB — ECHOCARDIOGRAM COMPLETE
Area-P 1/2: 4.49 cm2
Height: 67 in
P 1/2 time: 336 msec
S' Lateral: 2.4 cm
Weight: 3248 oz

## 2022-08-09 LAB — GLUCOSE, CAPILLARY
Glucose-Capillary: 256 mg/dL — ABNORMAL HIGH (ref 70–99)
Glucose-Capillary: 365 mg/dL — ABNORMAL HIGH (ref 70–99)

## 2022-08-09 LAB — HEMOGLOBIN A1C
Hgb A1c MFr Bld: 13.2 % — ABNORMAL HIGH (ref 4.8–5.6)
Mean Plasma Glucose: 332 mg/dL

## 2022-08-09 MED ORDER — POTASSIUM CHLORIDE IN NACL 20-0.9 MEQ/L-% IV SOLN
INTRAVENOUS | Status: DC
  Filled 2022-08-09 (×3): qty 1000

## 2022-08-09 MED ORDER — IOHEXOL 350 MG/ML SOLN
80.0000 mL | Freq: Once | INTRAVENOUS | Status: AC | PRN
  Administered 2022-08-09: 80 mL via INTRAVENOUS

## 2022-08-09 MED ORDER — POTASSIUM CHLORIDE IN NACL 20-0.9 MEQ/L-% IV SOLN
INTRAVENOUS | Status: DC
  Filled 2022-08-09: qty 1000

## 2022-08-09 MED ORDER — ALUM & MAG HYDROXIDE-SIMETH 200-200-20 MG/5ML PO SUSP
30.0000 mL | Freq: Four times a day (QID) | ORAL | Status: DC | PRN
  Administered 2022-08-09: 30 mL via ORAL
  Filled 2022-08-09: qty 30

## 2022-08-09 MED ORDER — INSULIN ASPART 100 UNIT/ML IJ SOLN
0.0000 [IU] | Freq: Every day | INTRAMUSCULAR | Status: DC
  Administered 2022-08-09: 5 [IU] via SUBCUTANEOUS
  Administered 2022-08-10: 3 [IU] via SUBCUTANEOUS

## 2022-08-09 MED ORDER — POTASSIUM CHLORIDE CRYS ER 20 MEQ PO TBCR
40.0000 meq | EXTENDED_RELEASE_TABLET | Freq: Once | ORAL | Status: AC
  Administered 2022-08-09: 40 meq via ORAL
  Filled 2022-08-09: qty 2

## 2022-08-09 MED ORDER — INSULIN GLARGINE-YFGN 100 UNIT/ML ~~LOC~~ SOLN
15.0000 [IU] | Freq: Every day | SUBCUTANEOUS | Status: DC
  Administered 2022-08-09: 15 [IU] via SUBCUTANEOUS
  Filled 2022-08-09 (×2): qty 0.15

## 2022-08-09 MED ORDER — INSULIN ASPART 100 UNIT/ML IJ SOLN
0.0000 [IU] | Freq: Three times a day (TID) | INTRAMUSCULAR | Status: DC
  Administered 2022-08-09 – 2022-08-10 (×3): 8 [IU] via SUBCUTANEOUS
  Administered 2022-08-10: 5 [IU] via SUBCUTANEOUS

## 2022-08-09 NOTE — ED Notes (Signed)
ED TO INPATIENT HANDOFF REPORT  ED Nurse Name and Phone #: Su Grand [phone]  S Name/Age/Gender Deborah [name] 67 y.o. female Room/Bed: 003C/003C  Code Status   Code Status: Full Code  Home/SNF/Other Home Patient oriented to: self, place, time, and situation Is this baseline? 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 No known hx of DM. Difficulty clearing throat in triage, placed nonrebrether.    Allergies Allergies  Allergen Reactions   Lisinopril Cough    Level of Care/Admitting Diagnosis ED Disposition     ED Disposition  Admit   Condition  --   Comment  Hospital Area: [place] [[id number]]  Level of Care: Progressive [102]  Admit to Progressive based on following criteria: GI, ENDOCRINE disease patients with GI bleeding, acute liver failure or pancreatitis, stable with diabetic ketoacidosis or thyrotoxicosis (hypothyroid) state.  May admit patient to [facility] or [facility] if equivalent level of care is available:: No  Covid Evaluation: Asymptomatic - no recent exposure (last 10 days) testing not required  Diagnosis: DKA, type 2 [facility]) [id number]  Admitting Physician: Neena Rhymes [[id number]]  Attending Physician: Neena Rhymes [id number]  Certification:: I certify this patient will need inpatient services for at least 2 midnights  Estimated Length of Stay: 3          B Medical/Surgery History Past Medical History:  Diagnosis Date   Anemia    h/o of   Anxiety    Arthritis    in knees   Asthma    Breast cancer ([place])    Bronchitis    COPD (chronic obstructive pulmonary disease) ([place])    Depression    Diverticulosis 11/15/2012   noted on screening colonoscopy    DOE (dyspnea on exertion) 04/28/2014   Esophageal stricture     FB bronchus    Former smoker 03/19/2011   GERD (gastroesophageal reflux disease)    Heart murmur    asymptomatic    History of laryngectomy    History of radiation therapy 11/15/18- 12/06/18   Right Breast total dose 42.56 Gy in 16 fractions.    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   Leukocytopenia    Nausea alone 07/28/2013   Neck pain 01/21/2012   Normal MRI 07/14/2011   negative for mestasis    Pneumonia 2012   RUQ abdominal pain 08/08/2022   On exam patient with marked tenderness to percussion and palpation RUQ. Obesity hinder palpation  PLan   Sciatica    Seizures ([name])    07/24/11 off Effexor w/o seizure   Sepsis ([place]) 08/04/2012   Sinusitis, chronic 07/20/2011   Bilateral maxillary, identified on MRI of head 07/14/11.     Tracheostomy dependent [facility])    Past Surgical History:  Procedure Laterality Date   BREAST LUMPECTOMY Right 07/06/2018   BREAST LUMPECTOMY WITH RADIOACTIVE SEED LOCALIZATION Right 07/06/2018   Procedure: RIGHT BREAST LUMPECTOMY WITH RADIOACTIVE SEED LOCALIZATION;  Surgeon: Rolm Bookbinder, MD;  Location: [place];  Service: General;  Laterality: Right;  COLONOSCOPY N/A 11/15/2012   Procedure: COLONOSCOPY;  Surgeon: Lafayette Dragon, MD;  [facility];  Service: Endoscopy;  Laterality: N/A;   CRYOTHERAPY  05/11/2021   Procedure: CRYOTHERAPY;  Surgeon: Garner Nash, DO;  Location: [facility];  Service: Pulmonary;;   DENTAL RESTORATION/EXTRACTION WITH X-RAY     ESOPHAGEAL DILATION N/A 09/09/2019   Procedure: ESOPHAGEAL DILATION;  Surgeon: [name], MD;  Location: [place];  Service: ENT;  Laterality: N/A;   ESOPHAGEAL DILATION N/A 10/05/2019   Procedure: ESOPHAGEAL DILATION;   Surgeon: [name], MD;  Location: [place];  Service: ENT;  Laterality: N/A;  via tracheostomy   ESOPHAGEAL DILATION N/A 02/27/2020   Procedure: ESOPHAGEAL DILATION;  Surgeon: [name], MD;  Location: [place];  Service: ENT;  Laterality: N/A;   ESOPHAGOGASTRODUODENOSCOPY (EGD) WITH PROPOFOL N/A 08/12/2018   Procedure: ESOPHAGOGASTRODUODENOSCOPY (EGD) WITH PROPOFOL;  Surgeon: Doran Stabler, MD;  Location: [place];  Service: Gastroenterology;  Laterality: N/A;   ESOPHAGOSCOPY  06/21/2012   Procedure: ESOPHAGOSCOPY;  Surgeon: [name], MD;  Location: [place];  Service: ENT;  Laterality: N/A;   ESOPHAGOSCOPY N/A 06/24/2021   Procedure: ESOPHAGOSCOPY WITH DILATATION;  Surgeon: [name], MD;  Location: [place];  Service: ENT;  Laterality: N/A;   ESOPHAGOSCOPY N/A 02/14/2022   Procedure: ESOPHAGOSCOPY WITH DILATATION;  Surgeon: [name], MD;  Location: [place];  Service: ENT;  Laterality: N/A;   ESOPHAGOSCOPY WITH DILITATION N/A 09/21/2014   Procedure: ESOPHAGOSCOPY WITH DILITATION;  Surgeon: [name], MD;  Location: [place];  Service: ENT;  Laterality: N/A;   ESOPHAGOSCOPY WITH DILITATION N/A 07/04/2016   Procedure: ESOPHAGOSCOPY WITH DILITATION;  Surgeon: [name], MD;  Location: [place];  Service: ENT;  Laterality: N/A;   ESOPHAGOSCOPY WITH DILITATION N/A 12/01/2017   Procedure: ESOPHAGOSCOPY WITH DILITATION;  Surgeon: [name], MD;  Location: [place];  Service: ENT;  Laterality: N/A;   ESOPHAGOSCOPY WITH DILITATION N/A 04/19/2018   Procedure: ESOPHAGOSCOPY WITH DILITATION;  Surgeon: [name], MD;  Location: [place];  Service: ENT;  Laterality: N/A;   ESOPHAGOSCOPY WITH DILITATION N/A 03/07/2019   Procedure: Esophagoscopy with dilatation;  Surgeon: [name], MD;  Location: [place];  Service: ENT;  Laterality: N/A;   ESOPHAGOSCOPY WITH DILITATION N/A 07/16/2020   Procedure: ESOPHAGOSCOPY  WITH DILATION;  Surgeon: [name], MD;  Location: [place];  Service: ENT;  Laterality: N/A;   [place] N/A 10/08/2020   Procedure: ESOPHAGOSCOPY With Dilation;  Surgeon: [name], MD;  Location: [place];  Service: ENT;  Laterality: N/A;  21 French to 41 french   [place]  01/08/2018       FOREIGN BODY REMOVAL  05/11/2021   Procedure: FOREIGN BODY REMOVAL;  Surgeon: Garner Nash, DO;  Location: [facility];  Service: Pulmonary;;   FOREIGN BODY REMOVAL BRONCHIAL  10/02/2011   Procedure: REMOVAL FOREIGN BODY BRONCHIAL;  Surgeon: Ruby Cola, MD;  [facility];  Service: ENT;  Laterality: N/A;   FOREIGN BODY REMOVAL BRONCHIAL N/A 01/08/2018   Procedure: REMOVAL FOREIGN BODY BRONCHIAL;  Surgeon: Jodi Marble, MD;  [facility];  Service: ENT;  Laterality: N/A;   HEMOSTASIS CONTROL  05/11/2021   Procedure: HEMOSTASIS CONTROL;  Surgeon: Garner Nash, DO;  Location: [place];  Service: Pulmonary;;   LARYNGECTOMY     Porta cath removal     PORTACATH PLACEMENT  09/17/10  Tip in cavoatrial junction   RE-EXCISION OF BREAST CANCER,SUPERIOR MARGINS Right 07/29/2018   Procedure: RE-EXCISION OF RIGHT BREAST MEDIAL MARGINS;  Surgeon: Rolm Bookbinder, MD;  Location: [place];  Service: General;  Laterality: Right;   RIGID ESOPHAGOSCOPY N/A 03/19/2019   Procedure: FLEXIBLE ESOPHAGOSCOPY;  Surgeon: Jodi Marble, MD;  Location: [place];  Service: ENT;  Laterality: N/A;   STOMAPLASTY N/A 10/21/2016   Procedure: [place];  Surgeon: [name], MD;  Location: [place];  Service: ENT;  Laterality: N/A;   TOTAL KNEE ARTHROPLASTY Right 12/12/2019   Procedure: RIGHT TOTAL KNEE ARTHROPLASTY;  Surgeon: Leandrew Koyanagi, MD;  Location: [place];  Service: Orthopedics;  Laterality: Right;   TOTAL KNEE ARTHROPLASTY Left 04/01/2021   Procedure: LEFT TOTAL KNEE ARTHROPLASTY;  Surgeon: Leandrew Koyanagi, MD;  Location: [place];  Service:  Orthopedics;  Laterality: Left;   TRACHEAL DILITATION  07/16/2011   Procedure: TRACHEAL DILITATION;  Surgeon: Beckie Salts, MD;  Location: [place];  Service: ENT;  Laterality: N/A;  dilation of tracheal stoma and replacement of stoma tube   [place] Bilateral 05/11/2021   Procedure: VIDEO BRONCHOSCOPY WITHOUT FLUORO;  Surgeon: Garner Nash, DO;  Location: [place];  Service: Pulmonary;  Laterality: Bilateral;  w/ cryotherapy, Foreign body extraction     A IV Location/Drains/Wounds Patient Lines/Drains/Airways Status     Active Line/Drains/Airways     Name Placement date Placement time Site Days   Peripheral IV 08/08/22 22 G Anterior;Left;Proximal Forearm 08/08/22  1633  Forearm  1   Peripheral IV 08/08/22 20 G 1" Left;Posterior Hand 08/08/22  2020  Hand  1   External Urinary Catheter 08/08/22  2226  --  1            Intake/Output Last 24 hours  Intake/Output Summary (Last 24 hours) at 08/09/2022 1156 Last data filed at 08/09/2022 0944 Gross per 24 hour  Intake 1204.65 ml  Output --  Net 1204.65 ml    Labs/Imaging Results for orders placed or performed during the hospital encounter of 08/08/22 (from the past 48 hour(s))  CBG monitoring, ED     Status: Abnormal   Collection Time: 08/08/22  1:44 PM  Result Value Ref Range   Glucose-Capillary >600 (HH) 70 - 99 mg/dL    Comment: Glucose reference range applies only to samples taken after fasting for at least 8 hours.  CBC with Differential     Status: None   Collection Time: 08/08/22  2:00 PM  Result Value Ref Range   WBC 6.8 4.0 - 10.5 K/uL   RBC 4.88 3.87 - 5.11 MIL/uL   Hemoglobin 14.5 12.0 - 15.0 g/dL   HCT 41.1 36.0 - 46.0 %   MCV 84.2 80.0 - 100.0 fL   MCH 29.7 26.0 - 34.0 pg   MCHC 35.3 30.0 - 36.0 g/dL   RDW 12.2 11.5 - 15.5 %   Platelets 347 150 - 400 K/uL   nRBC 0.0 0.0 - 0.2 %   Neutrophils Relative % 69 %   Neutro Abs 4.8 1.7 - 7.7 K/uL   Lymphocytes Relative 19 %    Lymphs Abs 1.3 0.7 - 4.0 K/uL   Monocytes Relative 9 %   Monocytes Absolute 0.6 0.1 - 1.0 K/uL   Eosinophils Relative 2 %   Eosinophils Absolute 0.1 0.0 - 0.5 K/uL   Basophils Relative 1 %   Basophils Absolute 0.1 0.0 - 0.1 K/uL   Immature Granulocytes 0 %  Abs Immature Granulocytes 0.01 0.00 - 0.07 K/uL    Comment: Performed at [facility], [place] [address] [place], [place] [id number]  Basic metabolic panel     Status: Abnormal   Collection Time: 08/08/22  2:00 PM  Result Value Ref Range   Sodium 124 (L) 135 - 145 mmol/L   Potassium 4.2 3.5 - 5.1 mmol/L   Chloride 81 (L) 98 - 111 mmol/L   CO2 26 22 - 32 mmol/L   Glucose, Bld 772 (HH) 70 - 99 mg/dL    Comment: CRITICAL RESULT CALLED TO, READ BACK BY AND VERIFIED WITH C. BRINKS RN 08/08/2022 1500 B NUNNERY Glucose reference range applies only to samples taken after fasting for at least 8 hours.    BUN 34 (H) 8 - 23 mg/dL   Creatinine, Ser 1.72 (H) 0.44 - 1.00 mg/dL    Comment: DELTA CHECK NOTED   Calcium 9.3 8.9 - 10.3 mg/dL   GFR, Estimated 33 (L) >60 mL/min    Comment: (NOTE) Calculated using the CKD-EPI Creatinine Equation (2021)    Anion gap 17 (H) 5 - 15    Comment: Performed at [facility] [address], [place], [place] [address]  Beta-hydroxybutyric acid     Status: Abnormal   Collection Time: 08/08/22  2:00 PM  Result Value Ref Range   Beta-Hydroxybutyric Acid 2.85 (H) 0.05 - 0.27 mmol/L    Comment: Performed at [facility] [address] [place], [place] [address]  Lactic acid, plasma     Status: None   Collection Time: 08/08/22  2:00 PM  Result Value Ref Range   Lactic Acid, Venous 1.8 0.5 - 1.9 mmol/L    Comment: Performed at [facility] [address], [place], [place] [address]  I-Stat venous blood gas, [facility] ED, [facility], [facility])     Status: Abnormal   Collection Time: 08/08/22  2:23 PM  Result Value Ref Range   pH, Ven 7.400 7.25 - 7.43   pCO2, Ven 50.7 44 - 60 mmHg   pO2, Ven 185 (H)  32 - 45 mmHg   Bicarbonate 31.4 (H) 20.0 - 28.0 mmol/L   TCO2 33 (H) 22 - 32 mmol/L   O2 Saturation 100 %   Acid-Base Excess 5.0 (H) 0.0 - 2.0 mmol/L   Sodium 125 (L) 135 - 145 mmol/L   Potassium 4.3 3.5 - 5.1 mmol/L   Calcium, Ion 1.08 (L) 1.15 - 1.40 mmol/L   HCT 43.0 36.0 - 46.0 %   Hemoglobin 14.6 12.0 - 15.0 g/dL   Sample type VENOUS   Lactic acid, plasma     Status: None   Collection Time: 08/08/22  4:03 PM  Result Value Ref Range   Lactic Acid, Venous 1.7 0.5 - 1.9 mmol/L    Comment: Performed at [facility] [address] [place], [place] [address]  Culture, blood (routine x 2)     Status: None (Preliminary result)   Collection Time: 08/08/22  4:03 PM   Specimen: BLOOD  Result Value Ref Range   Specimen Description BLOOD RIGHT ANTECUBITAL    Special Requests      BOTTLES DRAWN AEROBIC AND ANAEROBIC Blood Culture adequate volume   Culture      NO GROWTH < 24 HOURS Performed at [facility], [place] [address] [place], [place] [address]    Report Status PENDING   Procalcitonin     Status: None   Collection Time:  08/08/22  4:03 PM  Result Value Ref Range   Procalcitonin 0.92 ng/mL    Comment:        Interpretation: PCT > 0.5 ng/mL and <= 2 ng/mL: Systemic infection (sepsis) is possible, but other conditions are known to elevate PCT as well. (NOTE)       Sepsis PCT Algorithm           Lower Respiratory Tract                                      Infection PCT Algorithm    ----------------------------     ----------------------------         PCT < 0.25 ng/mL                PCT < 0.10 ng/mL          Strongly encourage             Strongly discourage   discontinuation of antibiotics    initiation of antibiotics    ----------------------------     -----------------------------       PCT 0.25 - 0.50 ng/mL            PCT 0.10 - 0.25 ng/mL               OR       >80% decrease in PCT            Discourage initiation of                                             antibiotics      Encourage discontinuation           of antibiotics    ----------------------------     -----------------------------         PCT >= 0.50 ng/mL              PCT 0.26 - 0.50 ng/mL                AND       <80% decrease in PCT             Encourage initiation of                                             antibiotics       Encourage continuation           of antibiotics    ----------------------------     -----------------------------        PCT >= 0.50 ng/mL                  PCT > 0.50 ng/mL               AND         increase in PCT                  Strongly encourage                                      initiation of antibiotics  Strongly encourage escalation           of antibiotics                                     -----------------------------                                           PCT <= 0.25 ng/mL                                                 OR                                        > 80% decrease in PCT                                      Discontinue / Do not initiate                                             antibiotics  Performed at [facility], [place] [address] [place], [place] [address]   Culture, blood (routine x 2)     Status: None (Preliminary result)   Collection Time: 08/08/22  4:10 PM   Specimen: BLOOD  Result Value Ref Range   Specimen Description BLOOD LEFT ANTECUBITAL    Special Requests      BOTTLES DRAWN AEROBIC AND ANAEROBIC Blood Culture results may not be optimal due to an inadequate volume of blood received in culture bottles   Culture      NO GROWTH < 24 HOURS Performed at [facility] [address], [place], [place] [address]    Report Status PENDING   Basic metabolic panel     Status: Abnormal   Collection Time: 08/08/22  4:24 PM  Result Value Ref Range   Sodium 125 (L) 135 - 145 mmol/L   Potassium 4.2 3.5 - 5.1 mmol/L   Chloride 81 (L) 98 - 111 mmol/L   CO2 25 22 - 32 mmol/L   Glucose, Bld 704 (HH)  70 - 99 mg/dL    Comment: CRITICAL RESULT CALLED TO, READ BACK BY AND VERIFIED WITH C.Allana Shrestha,RN '@[id number]'$  08/08/2022 VANG.J Glucose reference range applies only to samples taken after fasting for at least 8 hours.    BUN 34 (H) 8 - 23 mg/dL   Creatinine, Ser 1.60 (H) 0.44 - 1.00 mg/dL   Calcium 9.6 8.9 - 10.3 mg/dL   GFR, Estimated 36 (L) >60 mL/min    Comment: (NOTE) Calculated using the CKD-EPI Creatinine Equation (2021)    Anion gap 19 (H) 5 - 15    Comment: Performed at [facility] [address], [place], [place] [address]  CBG monitoring, ED     Status: Abnormal   Collection Time: 08/08/22  5:54 PM  Result Value Ref Range   Glucose-Capillary >600 (  HH) 70 - 99 mg/dL    Comment: Glucose reference range applies only to samples taken after fasting for at least 8 hours.  CBG monitoring, ED     Status: Abnormal   Collection Time: 08/08/22  7:03 PM  Result Value Ref Range   Glucose-Capillary 513 (HH) 70 - 99 mg/dL    Comment: Glucose reference range applies only to samples taken after fasting for at least 8 hours.   Comment 1 Document in Chart   Basic metabolic panel     Status: Abnormal   Collection Time: 08/08/22  7:50 PM  Result Value Ref Range   Sodium 129 (L) 135 - 145 mmol/L   Potassium 3.2 (L) 3.5 - 5.1 mmol/L   Chloride 87 (L) 98 - 111 mmol/L   CO2 27 22 - 32 mmol/L   Glucose, Bld 494 (H) 70 - 99 mg/dL    Comment: Glucose reference range applies only to samples taken after fasting for at least 8 hours.   BUN 34 (H) 8 - 23 mg/dL   Creatinine, Ser 1.38 (H) 0.44 - 1.00 mg/dL   Calcium 9.7 8.9 - 10.3 mg/dL   GFR, Estimated 43 (L) >60 mL/min    Comment: (NOTE) Calculated using the CKD-EPI Creatinine Equation (2021)    Anion gap 15 5 - 15    Comment: Performed at [facility] [address] [place], [place] [address]  Amylase     Status: None   Collection Time: 08/08/22  7:50 PM  Result Value Ref Range   Amylase 42 28 - 100 U/L    Comment: Performed at [facility] [address], [place], [place] [address]  Lipase, blood     Status: None   Collection Time: 08/08/22  7:50 PM  Result Value Ref Range   Lipase 50 11 - 51 U/L    Comment: Performed at [facility], [place] [address] [place], [place] [address]  TSH     Status: Abnormal   Collection Time: 08/08/22  7:50 PM  Result Value Ref Range   TSH <0.010 (L) 0.350 - 4.500 uIU/mL    Comment: REPEATED TO VERIFY Performed by a 3rd Generation assay with a functional sensitivity of <=0.01 uIU/mL. Performed at [facility], [place] [address], [place], [place] [address]   CBG monitoring, ED     Status: Abnormal   Collection Time: 08/08/22  7:50 PM  Result Value Ref Range   Glucose-Capillary 510 (HH) 70 - 99 mg/dL    Comment: Glucose reference range applies only to samples taken after fasting for at least 8 hours.  CBG monitoring, ED     Status: Abnormal   Collection Time: 08/08/22  8:24 PM  Result Value Ref Range   Glucose-Capillary 410 (H) 70 - 99 mg/dL    Comment: Glucose reference range applies only to samples taken after fasting for at least 8 hours.  CBG monitoring, ED     Status: Abnormal   Collection Time: 08/08/22  9:20 PM  Result Value Ref Range   Glucose-Capillary 357 (H) 70 - 99 mg/dL    Comment: Glucose reference range applies only to samples taken after fasting for at least 8 hours.  CBG monitoring, ED     Status: Abnormal   Collection Time: 08/08/22 10:19 PM  Result Value Ref Range   Glucose-Capillary 303 (H) 70 - 99 mg/dL    Comment: Glucose reference range applies only to samples taken after fasting for  at least 8 hours.  Basic metabolic panel     Status: Abnormal   Collection Time: 08/08/22 10:20 PM  Result Value Ref Range   Sodium 129 (L) 135 - 145 mmol/L   Potassium 5.5 (H) 3.5 - 5.1 mmol/L   Chloride 90 (L) 98 - 111 mmol/L   CO2 31 22 - 32 mmol/L   Glucose, Bld 287 (H) 70 - 99 mg/dL    Comment: Glucose reference range applies only to samples taken after  fasting for at least 8 hours.   BUN 26 (H) 8 - 23 mg/dL   Creatinine, Ser 0.97 0.44 - 1.00 mg/dL   Calcium 9.5 8.9 - 10.3 mg/dL   GFR, Estimated >60 >60 mL/min    Comment: (NOTE) Calculated using the CKD-EPI Creatinine Equation (2021)    Anion gap 8 5 - 15    Comment: Performed at [facility] [address] [place], [place] [address]  CBG monitoring, ED     Status: Abnormal   Collection Time: 08/08/22 11:26 PM  Result Value Ref Range   Glucose-Capillary 267 (H) 70 - 99 mg/dL    Comment: Glucose reference range applies only to samples taken after fasting for at least 8 hours.  Urinalysis, Routine w reflex microscopic -Urine, Clean Catch     Status: Abnormal   Collection Time: 08/09/22 12:26 AM  Result Value Ref Range   Color, Urine STRAW (A) YELLOW   APPearance CLEAR CLEAR   Specific Gravity, Urine 1.015 1.005 - 1.030   pH 6.0 5.0 - 8.0   Glucose, UA >=500 (A) NEGATIVE mg/dL   Hgb urine dipstick NEGATIVE NEGATIVE   Bilirubin Urine NEGATIVE NEGATIVE   Ketones, ur 5 (A) NEGATIVE mg/dL   Protein, ur NEGATIVE NEGATIVE mg/dL   Nitrite NEGATIVE NEGATIVE   Leukocytes,Ua NEGATIVE NEGATIVE   RBC / HPF 0-5 0 - 5 RBC/hpf   WBC, UA 0-5 0 - 5 WBC/hpf   Bacteria, UA RARE (A) NONE SEEN   Squamous Epithelial / HPF 0-5 0 - 5 /HPF   Mucus PRESENT     Comment: Performed at [facility], [address], [place], [place] [id number]  Basic metabolic panel     Status: Abnormal   Collection Time: 08/09/22  5:10 AM  Result Value Ref Range   Sodium 133 (L) 135 - 145 mmol/L   Potassium 3.3 (L) 3.5 - 5.1 mmol/L   Chloride 90 (L) 98 - 111 mmol/L   CO2 31 22 - 32 mmol/L   Glucose, Bld 176 (H) 70 - 99 mg/dL    Comment: Glucose reference range applies only to samples taken after fasting for at least 8 hours.   BUN 22 8 - 23 mg/dL   Creatinine, Ser 0.88 0.44 - 1.00 mg/dL   Calcium 9.8 8.9 - 10.3 mg/dL   GFR, Estimated >60 >60 mL/min    Comment: (NOTE) Calculated using the CKD-EPI  Creatinine Equation (2021)    Anion gap 12 5 - 15    Comment: Performed at [facility] [address], [place], [place] [address]   *Note: Due to a large number of results and/or encounters for the requested time period, some results have not been displayed. A complete set of results can be found in Results Review.   CT ABDOMEN PELVIS W CONTRAST  Result Date: 08/09/2022 CLINICAL DATA:  Abdominal pain, hyperglycemia. EXAM: CT ABDOMEN AND PELVIS WITH CONTRAST TECHNIQUE: Multidetector CT imaging of the abdomen and pelvis was performed  using the standard protocol following bolus administration of intravenous contrast. RADIATION DOSE REDUCTION: This exam was performed according to the departmental dose-optimization program which includes automated exposure control, adjustment of the mA and/or kV according to patient size and/or use of iterative reconstruction technique. CONTRAST:  51m OMNIPAQUE IOHEXOL 350 MG/ML SOLN COMPARISON:  CT abdomen dated 11/20/2010. FINDINGS: Lower chest: Segmental airspace collapse within the medial aspects of the LEFT lower lobe, incompletely imaged, also present on chest CT angiogram of 08/29/2021. Hepatobiliary: Liver is diffusely low in density indicating fatty infiltration. No focal mass or lesion is seen within the liver. Gallbladder appears normal. No bile duct dilatation is seen. Pancreas: Unremarkable. No pancreatic ductal dilatation or surrounding inflammatory changes. Spleen: Normal in size without focal abnormality. Adrenals/Urinary Tract: Adrenal glands appear normal. Mild bilateral hydronephrosis. Bladder is moderately distended but otherwise unremarkable. No renal or ureteral calculi. No perinephric inflammation. Stomach/Bowel: No dilated large or small bowel loops. No evidence of focal or generalized bowel wall inflammation. Appendix appears normal. Scattered diverticulosis of the sigmoid and descending colon but no focal inflammatory changes seen to suggest  acute diverticulitis. Stomach is unremarkable. Vascular/Lymphatic: No abdominal aortic aneurysm. No acute-appearing vascular abnormality. IVC appears somewhat decompressed suggesting hypovolemia. No enlarged lymph nodes are identified in the abdomen or pelvis. Reproductive: Probable uterine fibroid at the dome of the fundus. No adnexal mass or free fluid. Other: No free fluid or abscess collection is seen. No free intraperitoneal air. Musculoskeletal: Degenerative spondylosis of the slightly scoliotic thoracolumbar spine, moderate in degree. No acute-appearing osseous abnormality. Superficial soft tissues of the abdomen and pelvis are unremarkable. IMPRESSION: 1. Bladder is moderately distended causing mild bilateral hydronephrosis. Kidneys, ureters and bladder are otherwise unremarkable. No renal or ureteral calculi. 2. IVC appears somewhat decompressed suggesting hypovolemia. 3. No bowel obstruction or evidence of bowel wall inflammation. No evidence of pyelonephritis. No free fluid or abscess collection. No free intraperitoneal air. 4. Colonic diverticulosis without evidence of acute diverticulitis. 5. Fatty infiltration of the liver. 6. Probable uterine fibroid. Electronically Signed   By: SFranki CabotM.D.   On: 08/09/2022 11:14   UKoreaAbdomen Limited RUQ (LIVER/GB)  Result Date: 08/08/2022 CLINICAL DATA:  Right upper quadrant abdominal pain. EXAM: ULTRASOUND ABDOMEN LIMITED RIGHT UPPER QUADRANT COMPARISON:  None Available. FINDINGS: Gallbladder: No gallstones or wall thickening visualized. No sonographic Murphy sign noted by sonographer. Common bile duct: Diameter: 3 mm, normal Liver: Diffusely increased hepatic parenchymal echotexture suggesting fatty infiltration. No focal lesions are identified. Portal vein is patent on color Doppler imaging with normal direction of blood flow towards the liver. Other: Examination is somewhat technically limited due to patient body habitus and current condition.  IMPRESSION: Diffuse fatty infiltration of the liver. No evidence of cholelithiasis or acute cholecystitis. Electronically Signed   By: WLucienne CapersM.D.   On: 08/08/2022 20:14   DG Chest 2 View  Result Date: 08/08/2022 CLINICAL DATA:  SOB EXAM: CHEST - 2 VIEW COMPARISON:  06/06/22 CXR FINDINGS: No pleural effusion. No pneumothorax. Normal cardiac and mediastinal contours. There are new patchy bibasilar opacities, right-greater-than-left. Radiographically apparent displaced rib fractures. Visualized upper is unremarkable. Vertebral body heights are maintained. IMPRESSION: New patchy bibasilar opacities, right-greater-than-left, which could represent aspiration or infection. Electronically Signed   By: HMarin RobertsM.D.   On: 08/08/2022 15:11    Pending Labs Unresulted Labs (From admission, onward)     Start     Ordered   08/15/22 0500  Creatinine, serum  (enoxaparin (LOVENOX)  CrCl >/= 30 ml/min)  Weekly,   R     Comments: while on enoxaparin therapy    08/08/22 1813   08/10/22 [id number]  Basic metabolic panel  Daily,   R      08/09/22 0848   08/10/22 0500  CBC  Tomorrow morning,   R        08/09/22 0909   08/09/22 0847  Magnesium  Add-on,   AD        08/09/22 0848   08/09/22 0845  Hepatic function panel  Add-on,   AD        08/09/22 0844            Vitals/Pain Today's Vitals   08/09/22 0800 08/09/22 0811 08/09/22 0958 08/09/22 1027  BP: (!) 114/91 (!) 114/91 136/80   Pulse: 91 91    Resp:  20    Temp:  98.2 F (36.8 C)    TempSrc:      SpO2: 97% 96%  97%  Weight:      Height:      PainSc:        Isolation Precautions No active isolations  Medications Medications  lactated ringers infusion ( Intravenous Not Given 08/08/22 2027)  enoxaparin (LOVENOX) injection 40 mg (40 mg Subcutaneous Given 08/09/22 0023)  anastrozole (ARIMIDEX) tablet 1 mg (1 mg Oral Given 08/09/22 0957)  amLODipine (NORVASC) tablet 10 mg (10 mg Oral Given 08/09/22 0958)  metoprolol tartrate  (LOPRESSOR) tablet 50 mg (50 mg Oral Given 08/09/22 0958)  escitalopram (LEXAPRO) tablet 20 mg (20 mg Oral Given 08/09/22 0957)  levothyroxine (SYNTHROID) tablet 175 mcg (175 mcg Oral Given 08/09/22 0530)  pantoprazole (PROTONIX) EC tablet 80 mg (80 mg Oral Given 08/09/22 0958)  ondansetron (ZOFRAN) tablet 4 mg (has no administration in time range)  albuterol (PROVENTIL) (2.5 MG/3ML) 0.083% nebulizer solution 2.5 mg (has no administration in time range)  benzonatate (TESSALON) capsule 200 mg (has no administration in time range)  budesonide (PULMICORT) nebulizer solution 0.5 mg (has no administration in time range)  loratadine (CLARITIN) tablet 10 mg (10 mg Oral Given 08/09/22 0958)  fluticasone (FLONASE) 50 MCG/ACT nasal spray 1 spray (has no administration in time range)  ipratropium-albuterol (DUONEB) 0.5-2.5 (3) MG/3ML nebulizer solution 3 mL (has no administration in time range)  montelukast (SINGULAIR) tablet 10 mg (10 mg Oral Not Given 08/08/22 2200)  sodium chloride HYPERTONIC 3 % nebulizer solution 4 mL (has no administration in time range)  insulin regular, human (MYXREDLIN) 100 units/ 100 mL infusion (6 Units/hr Intravenous Rate/Dose Change 08/09/22 0806)  dextrose 5 % in lactated ringers infusion (0 mLs Intravenous Stopped 08/09/22 0830)  dextrose 50 % solution 0-50 mL (has no administration in time range)  losartan (COZAAR) tablet 100 mg (100 mg Oral Given 08/09/22 0958)  ipratropium (ATROVENT) nebulizer solution 0.5 mg (0.5 mg Nebulization Given 08/09/22 0910)  insulin glargine-yfgn (SEMGLEE) injection 15 Units (15 Units Subcutaneous Given 08/09/22 1111)  insulin aspart (novoLOG) injection 0-15 Units (has no administration in time range)  insulin aspart (novoLOG) injection 0-5 Units (has no administration in time range)  0.9 % NaCl with KCl 20 mEq/ L  infusion ( Intravenous New Bag/Given 08/09/22 1000)  lactated ringers bolus 1,800 mL (0 mLs Intravenous Stopped 08/08/22 2315)  potassium  chloride 10 mEq in 100 mL IVPB (0 mEq Intravenous Stopped 08/08/22 2259)  potassium chloride SA (KLOR-CON M) CR tablet 40 mEq (40 mEq Oral Given 08/09/22 0957)  iohexol (OMNIPAQUE) 350 MG/ML injection 80 mL (80  mLs Intravenous Contrast Given 08/09/22 1104)    Mobility walks     Focused Assessments Pulmonary Assessment Handoff:  Lung sounds: Bilateral Breath Sounds: Diminished O2 Device: Tracheostomy Collar O2 Flow Rate (L/min): 10 L/min    R Recommendations: See Admitting Provider Note  Report given to:   Additional Notes:

## 2022-08-09 NOTE — Progress Notes (Addendum)
  RD consulted for nutrition education regarding diabetes.    Lab Results  Component Value Date   HGBA1C 13.2 (H) 08/08/2022   PTA DM medications are none.   Labs reviewed: CBGS: 267-410 (inpatient orders for glycemic control are IV insulin drip).     Pt admitted with DKA and hyperglycemia.  Pt with no prior history of DM. DM coordinator following pt.   Pt unavailable at time of visit. Attempted to speak with pt via call to hospital room phone, however, unable to reach. RD unable to obtain further nutrition-related history or complete nutrition-focused physical exam at this time.    RD provided "Carbohydrate Counting for People with Diabetes" handout from the Academy of Nutrition and Dietetics. Attached to AVS/ discharge summary.   RD also referred to [facility]'s Nutrition and Diabetes Education Services for further reinforcement and support as an outpatient.   Body mass index is 31.79 kg/m. Pt meets criteria for obesity, class I based on current BMI. Obesity is a complex, chronic medical condition that is optimally managed by a multidisciplinary care team. Weight loss is not an ideal goal for an acute inpatient hospitalization. However, if further work-up for obesity is warranted, consider outpatient referral to [facility] Nutrition and Diabetes Education Services.    Current diet order is NPO, patient is consuming approximately n/a% of meals at this time. Labs and medications reviewed. No further nutrition interventions warranted at this time. RD contact information provided. If additional nutrition issues arise, please re-consult RD.  Loistine Chance, RD, LDN, [place] Registered Dietitian II Certified Diabetes Care and Education Specialist Please refer to [facility] for RD and/or RD on-call/weekend/after hours pager

## 2022-08-09 NOTE — Progress Notes (Addendum)
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 She is scheduled for TED replacement 08/11/22 and at preop evaluatation was found to have serum glucose >600. She endorses polydypsia, polyuria, dry mouth. Other medical problems include COPD/Asthma with last OV 07/22/22, HTN, GERD, hypothyroidism, depression, breast cancer. She was referred by preop to [facility] for evaluation and treatment.  There was concern for diabetic ketoacidosis.  Patient was placed on insulin infusion and hospitalized for further management.  Consultants: None.  Procedures: None    Subjective/Interval History: Husband is at the bedside.  Patient did have some respiratory difficulty last night but cleared up after she was suctioned.  Apparently there was a mucous plug.  Denies any respiratory symptoms currently.  She does complain however of abdominal pain in the upper abdomen as well as in the lower abdomen.  Denies any nausea or vomiting.    Assessment/Plan:  Diabetic ketoacidosis type II Patient with no previous history of diabetes.  She has new onset diabetes.  HbA1c is 13.2. Patient will be transition to subcutaneous insulin as her anion gap has closed.  Monitor CBGs.  Diabetes coordinator has seen the patient.  Patient will need diabetes education and education to self administer insulin. Patient would likely need to be discharged on insulin regimen. For now we will transition her to glargine and SSI.  Abdominal pain Patient seems to have pain in the right upper quadrant as well as in the suprapubic area.  UA did not suggest infection.  Her ultrasound of the right upper quadrant was unremarkable.  Her lipase was normal.  LFTs are pending.  We will proceed with CT  scan of the abdomen and pelvis.  Acute respiratory failure with hypoxia/history of laryngeal Cancer/status post laryngectomy Patient had a tracheal esophageal prostheses.  Apparently she expound this processes back in January after a coughing episode.  Looks like the fistula has closed and the patient has developed increased cough shortness of breath and trouble clearing mucus secretions.  Patient was supposed to undergo replacement of the TEE prostheses on 2/26 with [name].  At preop yesterday she was found to have high glucose levels and was sent to the emergency department. Had mucous plugging overnight.  Seems to be stable from a respiratory standpoint.  Chest x-ray did suggest opacities.  However she is afebrile with normal WBC.  COVID-19 PCR was negative.  Procalcitonin 0.92.  Lactic acid level was normal.  Continue to monitor off of antibiotics. [name] will need to be informed on Monday of her hospitalization.  Hopefully she will be stable enough by then to undergo surgery. Currently on trach collar.  Wean down oxygen to maintain saturations greater than 90%.  Chronic diastolic CHF Echocardiogram from 2019 showed normal systolic function of the left ventricle.  Grade 2 diastolic dysfunction was noted. Seems to be fairly euvolemic.  Hypokalemia Will be repleted.  Check magnesium level.  COPD Stable.  Continue nebulizer treatments.  GERD Continue PPI  Essential hypertension Monitor blood pressures.  Noted to be on amlodipine and hydrochlorothiazide and losartan along with metoprolol.  Hypothyroidism Continue levothyroxine.  Major depressive disorder Continue with home medications.  Obesity Estimated body mass index is 31.79 kg/m as calculated from the following:   Height as of this encounter: '5\' 7"'$  (1.702 m).  Weight as of this encounter: 92.1 kg.   DVT Prophylaxis: Lovenox Code Status: Full code Family Communication: Discussed with the patient and her  husband Disposition Plan: Hopefully return home when improved  Status is: Inpatient Remains inpatient appropriate because: Diabetic ketoacidosis    Medications: Scheduled:  amLODipine  10 mg Oral Daily   anastrozole  1 mg Oral Daily   enoxaparin (LOVENOX) injection  40 mg Subcutaneous Q24H   escitalopram  20 mg Oral Daily   hydrochlorothiazide  25 mg Oral Daily   insulin aspart  0-15 Units Subcutaneous TID WC   insulin aspart  0-5 Units Subcutaneous QHS   insulin glargine-yfgn  15 Units Subcutaneous Daily   ipratropium  0.5 mg Nebulization BID   levothyroxine  175 mcg Oral Q0600   loratadine  10 mg Oral Daily   losartan  100 mg Oral Daily   metoprolol tartrate  50 mg Oral BID   montelukast  10 mg Oral QHS   pantoprazole  80 mg Oral Daily   potassium chloride  40 mEq Oral Once   Continuous:  0.9 % NaCl with KCl 20 mEq / L     dextrose 5% lactated ringers 125 mL/hr at 08/09/22 0832   dextrose 5% lactated ringers Stopped (08/09/22 0830)   insulin 6 Units/hr (08/09/22 0806)   lactated ringers     [id number], benzonatate, budesonide, dextrose, fluticasone, ipratropium-albuterol, ondansetron, sodium chloride HYPERTONIC  Antibiotics: Anti-infectives (From admission, onward)    None       Objective:  Vital Signs  Vitals:   08/09/22 0600 08/09/22 0730 08/09/22 0800 08/09/22 0811  BP: 122/89 (!) 146/97 (!) 114/91 (!) 114/91  Pulse: 87 88 91 91  Resp:  20  20  Temp:    98.2 F (36.8 C)  TempSrc:      SpO2: 93% 95% 97% 96%  Weight:      Height:        Intake/Output Summary (Last 24 hours) at 08/09/2022 0855 Last data filed at 08/08/2022 1831 Gross per 24 hour  Intake 55.29 ml  Output --  Net 55.29 ml   Filed Weights   08/08/22 1347  Weight: 92.1 kg    General appearance: Awake alert.  In no distress Tracheostomy is noted Resp: Diminished air entry at the bases with few crackles.  No wheezing or rhonchi. Cardio: S1-S2 is normal regular.  No S3-S4.  No  rubs murmurs or bruit GI: Abdomen is soft.  Tender in the right upper quadrant as well as suprapubic area without any rebound rigidity or guarding.  No masses organomegaly.  Bowel sounds present. Extremities: No edema.  Full range of motion of lower extremities. Neurologic:  No focal neurological deficits.    Lab Results:  Data Reviewed: I have personally reviewed following labs and reports of the imaging studies  CBC: Recent Labs  Lab 08/08/22 1119 08/08/22 1400 08/08/22 1423  WBC 7.8 6.8  --   NEUTROABS  --  4.8  --   HGB 14.5 14.5 14.6  HCT 42.9 41.1 43.0  MCV 85.0 84.2  --   PLT 364 347  --     Basic Metabolic Panel: Recent Labs  Lab 08/08/22 1400 08/08/22 1423 08/08/22 1624 08/08/22 1950 08/08/22 2220 08/09/22 0510  NA 124* 125* 125* 129* 129* 133*  K 4.2 4.3 4.2 3.2* 5.5* 3.3*  CL 81*  --  81* 87* 90* 90*  CO2 26  --  '25 27 31 31  '$ GLUCOSE 772*  --  704* 494* 287* 176*  BUN 34*  --  34* 34* 26* 22  CREATININE 1.72*  --  1.60* 1.38* 0.97 0.88  CALCIUM 9.3  --  9.6 9.7 9.5 9.8    GFR: Estimated Creatinine Clearance: 75.2 mL/min (by C-G formula based on SCr of 0.88 mg/dL).    Recent Labs  Lab 08/08/22 1950  LIPASE 50  AMYLASE 42    HbA1C: Recent Labs    08/08/22 1243  HGBA1C 13.2*    CBG: Recent Labs  Lab 08/08/22 1950 08/08/22 2024 08/08/22 2120 08/08/22 2219 08/08/22 2326  GLUCAP 510* 410* 357* 303* 267*     Thyroid Function Tests: Recent Labs    08/08/22 1950  TSH <0.010*     Recent Results (from the past 240 hour(s))  Surgical pcr screen     Status: Abnormal   Collection Time: 08/08/22 11:19 AM   Specimen: Nasal Mucosa; Nasal Swab  Result Value Ref Range Status   MRSA, PCR POSITIVE (A) NEGATIVE Final    Comment: RESULT CALLED TO, READ BACK BY AND VERIFIED WITH: RN Renda Rolls [id number] AT [id number] BY EC    Staphylococcus aureus POSITIVE (A) NEGATIVE Final    Comment: (NOTE) The Xpert SA Assay (FDA approved for NASAL  specimens in patients 74 years of age and older), is one component of a comprehensive surveillance program. It is not intended to diagnose infection nor to guide or monitor treatment. Performed at [facility], [place] [address], [place], [place] [address]   SARS CORONAVIRUS 2 (TAT 6-24 HRS) Anterior Nasal Swab     Status: None   Collection Time: 08/08/22 11:19 AM   Specimen: Anterior Nasal Swab  Result Value Ref Range Status   SARS Coronavirus 2 NEGATIVE NEGATIVE Final    Comment: (NOTE) SARS-CoV-2 target nucleic acids are NOT DETECTED.  The SARS-CoV-2 RNA is generally detectable in upper and lower respiratory specimens during the acute phase of infection. Negative results do not preclude SARS-CoV-2 infection, do not rule out co-infections with other pathogens, and should not be used as the sole basis for treatment or other patient management decisions. Negative results must be combined with clinical observations, patient history, and epidemiological information. The expected result is Negative.  Fact Sheet for Patients: SugarRoll.be  Fact Sheet for Healthcare Providers: https://www.woods-mathews.com/  This test is not yet approved or cleared by the [place] FDA and  has been authorized for detection and/or diagnosis of SARS-CoV-2 by FDA under an Emergency Use Authorization (EUA). This EUA will remain  in effect (meaning this test can be used) for the duration of the COVID-19 declaration under Se ction 564(b)(1) of the Act, 21 U.S.C. section 360bbb-3(b)(1), unless the authorization is terminated or revoked sooner.  Performed at [facility], [place] [address], [place], [place] [address]   Culture, blood (routine x 2)     Status: None (Preliminary result)   Collection Time: 08/08/22  4:03 PM   Specimen: BLOOD  Result Value Ref Range Status   Specimen Description BLOOD RIGHT ANTECUBITAL  Final   Special Requests   Final     BOTTLES DRAWN AEROBIC AND ANAEROBIC Blood Culture adequate volume   Culture   Final    NO GROWTH < 24 HOURS Performed at [facility], [place] [address] [place], [place] [address]    Report Status PENDING  Incomplete  Culture, blood (routine x 2)     Status: None (Preliminary result)   Collection Time: 08/08/22  4:10  PM   Specimen: BLOOD  Result Value Ref Range Status   Specimen Description BLOOD LEFT ANTECUBITAL  Final   Special Requests   Final    BOTTLES DRAWN AEROBIC AND ANAEROBIC Blood Culture results may not be optimal due to an inadequate volume of blood received in culture bottles   Culture   Final    NO GROWTH < 24 HOURS Performed at [place] [address] [place], [place] [address]    Report Status PENDING  Incomplete      Radiology Studies: US Abdomen Limited RUQ (LIVER/GB)  Result Date: 08/08/2022 CLINICAL DATA:  Right upper quadrant abdominal pain. EXAM: ULTRASOUND ABDOMEN LIMITED RIGHT UPPER QUADRANT COMPARISON:  None Available. FINDINGS: Gallbladder: No gallstones or wall thickening visualized. No sonographic Murphy sign noted by sonographer. Common bile duct: Diameter: 3 mm, normal Liver: Diffusely increased hepatic parenchymal echotexture suggesting fatty infiltration. No focal lesions are identified. Portal vein is patent on color Doppler imaging with normal direction of blood flow towards the liver. Other: Examination is somewhat technically limited due to patient body habitus and current condition. IMPRESSION: Diffuse fatty infiltration of the liver. No evidence of cholelithiasis or acute cholecystitis. Electronically Signed   By: Lucienne Capers M.D.   On: 08/08/2022 20:14   DG Chest 2 View  Result Date: 08/08/2022 CLINICAL DATA:  SOB EXAM: CHEST - 2 VIEW COMPARISON:  06/06/22 CXR FINDINGS: No pleural effusion. No pneumothorax. Normal cardiac and mediastinal contours. There are new patchy bibasilar opacities, right-greater-than-left.  Radiographically apparent displaced rib fractures. Visualized upper is unremarkable. Vertebral body heights are maintained. IMPRESSION: New patchy bibasilar opacities, right-greater-than-left, which could represent aspiration or infection. Electronically Signed   By: Marin Roberts M.D.   On: 08/08/2022 15:11       LOS: 1 day   [facility] Pager on [email or web address]  08/09/2022, 8:55 AM

## 2022-08-09 NOTE — Progress Notes (Signed)
  Echocardiogram 2D Echocardiogram has been performed.  Deborah [name] 08/09/2022, 10:08 AM

## 2022-08-09 NOTE — Discharge Instructions (Signed)
Follow with Primary MD Littie Deeds, MD in 7 days, follow-up with your ENT surgeon within a week continue trach site care as instructed by your ENT surgeon.  Get CBC, CMP, 2 view Chest X ray -  checked next visit with your primary MD   Activity: As tolerated with Full fall precautions use walker/cane & assistance as needed  Disposition Home   Diet: Heart Healthy Low Carb   Accuchecks 4 times/day, Once in AM empty stomach and then before each meal. Log in all results and show them to your Prim.MD in 3 days. If any glucose reading is under 80 or above 300 call your Prim MD immidiately. Follow Low glucose instructions for glucose under 80 as instructed.   Special Instructions: If you have smoked or chewed Tobacco  in the last 2 yrs please stop smoking, stop any regular Alcohol  and or any Recreational drug use.  On your next visit with your primary care physician please Get Medicines reviewed and adjusted.  Please request your Prim.MD to go over all Hospital Tests and Procedure/Radiological results at the follow up, please get all Hospital records sent to your Prim MD by signing hospital release before you go home.  If you experience worsening of your admission symptoms, develop shortness of breath, life threatening emergency, suicidal or homicidal thoughts you must seek medical attention immediately by calling 911 or calling your MD immediately  if symptoms less severe.  You Must read complete instructions/literature along with all the possible adverse reactions/side effects for all the Medicines you take and that have been prescribed to you. Take any new Medicines after you have completely understood and accpet all the possible adverse reactions/side effects.    Carbohydrate Counting For People With Diabetes  Foods with carbohydrates make your blood glucose level go up. Learning how to count carbohydrates can help you control your blood glucose levels. First, identify the foods you eat  that contain carbohydrates. Then, using the Foods with Carbohydrates chart, determine about how much carbohydrates are in your meals and snacks. Make sure you are eating foods with fiber, protein, and healthy fat along with your carbohydrate foods. Foods with Carbohydrates The following table shows carbohydrate foods that have about 15 grams of carbohydrate each. Using measuring cups, spoons, or a food scale when you first begin learning about carbohydrate counting can help you learn about the portion sizes you typically eat. The following foods have 15 grams carbohydrate each:  Grains 1 slice bread (1 ounce)  1 small tortilla (6-inch size)   large bagel (1 ounce)  1/3 cup pasta or rice (cooked)   hamburger or hot dog bun ( ounce)   cup cooked cereal   to  cup ready-to-eat cereal  2 taco shells (5-inch size) Fruit 1 small fresh fruit ( to 1 cup)   medium banana  17 small grapes (3 ounces)  1 cup melon or berries   cup canned or frozen fruit  2 tablespoons dried fruit (blueberries, cherries, cranberries, raisins)   cup unsweetened fruit juice  Starchy Vegetables  cup cooked beans, peas, corn, potatoes/sweet potatoes   large baked potato (3 ounces)  1 cup acorn or butternut squash  Snack Foods 3 to 6 crackers  8 potato chips or 13 tortilla chips ( ounce to 1 ounce)  3 cups popped popcorn  Dairy 3/4 cup (6 ounces) nonfat plain yogurt, or yogurt with sugar-free sweetener  1 cup milk  1 cup plain rice, soy, coconut or flavored almond milk Sweets  and Desserts  cup ice cream or frozen yogurt  1 tablespoon jam, jelly, pancake syrup, table sugar, or honey  2 tablespoons light pancake syrup  1 inch square of frosted cake or 2 inch square of unfrosted cake  2 small cookies (2/3 ounce each) or  large cookie  Sometimes you'll have to estimate carbohydrate amounts if you don't know the exact recipe. One cup of mixed foods like soups can have 1 to 2 carbohydrate servings, while  some casseroles might have 2 or more servings of carbohydrate. Foods that have less than 20 calories in each serving can be counted as "free" foods. Count 1 cup raw vegetables, or  cup cooked non-starchy vegetables as "free" foods. If you eat 3 or more servings at one meal, then count them as 1 carbohydrate serving.  Foods without Carbohydrates  Not all foods contain carbohydrates. Meat, some dairy, fats, non-starchy vegetables, and many beverages don't contain carbohydrate. So when you count carbohydrates, you can generally exclude chicken, pork, beef, fish, seafood, eggs, tofu, cheese, butter, sour cream, avocado, nuts, seeds, olives, mayonnaise, water, black coffee, unsweetened tea, and zero-calorie drinks. Vegetables with no or low carbohydrate include green beans, cauliflower, tomatoes, and onions. How much carbohydrate should I eat at each meal?  Carbohydrate counting can help you plan your meals and manage your weight. Following are some starting points for carbohydrate intake at each meal. Work with your registered dietitian nutritionist to find the best range that works for your blood glucose and weight.   To Lose Weight To Maintain Weight  Women 2 - 3 carb servings 3 - 4 carb servings  Men 3 - 4 carb servings 4 - 5 carb servings  Checking your blood glucose after meals will help you know if you need to adjust the timing, type, or number of carbohydrate servings in your meal plan. Achieve and keep a healthy body weight by balancing your food intake and physical activity.  Tips How should I plan my meals?  Plan for half the food on your plate to include non-starchy vegetables, like salad greens, broccoli, or carrots. Try to eat 3 to 5 servings of non-starchy vegetables every day. Have a protein food at each meal. Protein foods include chicken, fish, meat, eggs, or beans (note that beans contain carbohydrate). These two food groups (non-starchy vegetables and proteins) are low in carbohydrate.  If you fill up your plate with these foods, you will eat less carbohydrate but still fill up your stomach. Try to limit your carbohydrate portion to  of the plate.  What fats are healthiest to eat?  Diabetes increases risk for heart disease. To help protect your heart, eat more healthy fats, such as olive oil, nuts, and avocado. Eat less saturated fats like butter, cream, and high-fat meats, like bacon and sausage. Avoid trans fats, which are in all foods that list "partially hydrogenated oil" as an ingredient. What should I drink?  Choose drinks that are not sweetened with sugar. The healthiest choices are water, carbonated or seltzer waters, and tea and coffee without added sugars.  Sweet drinks will make your blood glucose go up very quickly. One serving of soda or energy drink is  cup. It is best to drink these beverages only if your blood glucose is low.  Artificially sweetened, or diet drinks, typically do not increase your blood glucose if they have zero calories in them. Read labels of beverages, as some diet drinks do have carbohydrate and will raise your blood glucose.  Label Reading Tips Read Nutrition Facts labels to find out how many grams of carbohydrate are in a food you want to eat. Don't forget: sometimes serving sizes on the label aren't the same as how much food you are going to eat, so you may need to calculate how much carbohydrate is in the food you are serving yourself.   Carbohydrate Counting for People with Diabetes Sample 1-Day Menu  Breakfast  cup yogurt, low fat, low sugar (1 carbohydrate serving)   cup cereal, ready-to-eat, unsweetened (1 carbohydrate serving)  1 cup strawberries (1 carbohydrate serving)   cup almonds ( carbohydrate serving)  Lunch 1, 5 ounce can chunk light tuna  2 ounces cheese, low fat cheddar  6 whole wheat crackers (1 carbohydrate serving)  1 small apple (1 carbohydrate servings)   cup carrots ( carbohydrate serving)   cup snap peas  1  cup 1% milk (1 carbohydrate serving)   Evening Meal Stir fry made with: 3 ounces chicken  1 cup brown rice (3 carbohydrate servings)   cup broccoli ( carbohydrate serving)   cup green beans   cup onions  1 tablespoon olive oil  2 tablespoons teriyaki sauce ( carbohydrate serving)  Evening Snack 1 extra small banana (1 carbohydrate serving)  1 tablespoon peanut butter   Carbohydrate Counting for People with Diabetes Vegan Sample 1-Day Menu  Breakfast 1 cup cooked oatmeal (2 carbohydrate servings)   cup blueberries (1 carbohydrate serving)  2 tablespoons flaxseeds  1 cup soymilk fortified with calcium and vitamin D  1 cup coffee  Lunch 2 slices whole wheat bread (2 carbohydrate servings)   cup baked tofu   cup lettuce  2 slices tomato  2 slices avocado   cup baby carrots ( carbohydrate serving)  1 orange (1 carbohydrate serving)  1 cup soymilk fortified with calcium and vitamin D   Evening Meal Burrito made with: 1 6-inch corn tortilla (1 carbohydrate serving)  1 cup refried vegetarian beans (2 carbohydrate servings)   cup chopped tomatoes   cup lettuce   cup salsa  1/3 cup brown rice (1 carbohydrate serving)  1 tablespoon olive oil for rice   cup zucchini   Evening Snack 6 small whole grain crackers (1 carbohydrate serving)  2 apricots ( carbohydrate serving)   cup unsalted peanuts ( carbohydrate serving)    Carbohydrate Counting for People with Diabetes Vegetarian (Lacto-Ovo) Sample 1-Day Menu  Breakfast 1 cup cooked oatmeal (2 carbohydrate servings)   cup blueberries (1 carbohydrate serving)  2 tablespoons flaxseeds  1 egg  1 cup 1% milk (1 carbohydrate serving)  1 cup coffee  Lunch 2 slices whole wheat bread (2 carbohydrate servings)  2 ounces low-fat cheese   cup lettuce  2 slices tomato  2 slices avocado   cup baby carrots ( carbohydrate serving)  1 orange (1 carbohydrate serving)  1 cup unsweetened tea  Evening Meal Burrito made with: 1  6-inch corn tortilla (1 carbohydrate serving)   cup refried vegetarian beans (1 carbohydrate serving)   cup tomatoes   cup lettuce   cup salsa  1/3 cup brown rice (1 carbohydrate serving)  1 tablespoon olive oil for rice   cup zucchini  1 cup 1% milk (1 carbohydrate serving)  Evening Snack 6 small whole grain crackers (1 carbohydrate serving)  2 apricots ( carbohydrate serving)   cup unsalted peanuts ( carbohydrate serving)    Copyright 2020  [employer]. All rights reserved.  Using Nutrition Labels: Carbohydrate  Serving Size  Look at the serving size. All the information on the label is based on this portion. Servings Per Container  The number of servings contained in the package. Guidelines for Carbohydrate  Look at the total grams of carbohydrate in the serving size.  1 carbohydrate choice = 15 grams of carbohydrate. Range of Carbohydrate Grams Per Choice  Carbohydrate Grams/Choice Carbohydrate Choices  6-10   11-20 1  21-25 1  26-35 2  36-40 2  41-50 3  51-55 3  56-65 4  66-70 4  71-80 5    Copyright 2020  [employer]y [employer]. All rights reserved.   Plate Method for Diabetes   Foods with carbohydrates make your blood glucose level go up. The plate method is a simple way to meal plan and control the amount of carbohydrate you eat.         Use the following guidance to build a healthy plate to control carbohydrates. Divide a 9-inch plate into 3 sections, and consider your beverage the 4th section of your meal: Food Group Examples of Foods/Beverages for This Section of your Meal  Section 1: Non-starchy vegetables Fill  of your plate to include non-starchy vegetables Asparagus, broccoli, brussels sprouts, cabbage, carrots, cauliflower, celery, cucumber, green beans, mushrooms, peppers, salad greens, tomatoes, or zucchini.  Section 2: Protein foods Fill  of your plate to include a lean protein Lean meat, poultry,  fish, seafood, cheese, eggs, lean deli meat, tofu, beans, lentils, nuts or nut butters.  Section 3: Carbohydrate foods Fill  of your plate to include carbohydrate foods Whole grains, whole wheat bread, brown rice, whole grain pasta, polenta, corn tortillas, fruit, or starchy vegetables (potatoes, green peas, corn, beans, acorn squash, and butternut squash). One cup of milk also counts as a food that contains carbohydrate.  Section 4: Beverage Choose water or a low-calorie drink for your beverage. Unsweetened tea, coffee, or flavored/sparkling water without added sugar.  Image reprinted with permission from The [employer].  Copyright 2022 by the American [employer] Association.   Copyright 2022  [employer]. All rights reserved

## 2022-08-10 DIAGNOSIS — E876 Hypokalemia: Secondary | ICD-10-CM | POA: Diagnosis not present

## 2022-08-10 DIAGNOSIS — J9601 Acute respiratory failure with hypoxia: Secondary | ICD-10-CM | POA: Diagnosis not present

## 2022-08-10 DIAGNOSIS — E1110 Type 2 diabetes mellitus with ketoacidosis without coma: Secondary | ICD-10-CM | POA: Diagnosis not present

## 2022-08-10 DIAGNOSIS — I10 Essential (primary) hypertension: Secondary | ICD-10-CM | POA: Diagnosis not present

## 2022-08-10 DIAGNOSIS — R739 Hyperglycemia, unspecified: Secondary | ICD-10-CM

## 2022-08-10 DIAGNOSIS — E871 Hypo-osmolality and hyponatremia: Secondary | ICD-10-CM | POA: Diagnosis not present

## 2022-08-10 LAB — COMPREHENSIVE METABOLIC PANEL
ALT: 61 U/L — ABNORMAL HIGH (ref 0–44)
AST: 79 U/L — ABNORMAL HIGH (ref 15–41)
Albumin: 3 g/dL — ABNORMAL LOW (ref 3.5–5.0)
Alkaline Phosphatase: 116 U/L (ref 38–126)
Anion gap: 9 (ref 5–15)
BUN: 14 mg/dL (ref 8–23)
CO2: 28 mmol/L (ref 22–32)
Calcium: 9.4 mg/dL (ref 8.9–10.3)
Chloride: 99 mmol/L (ref 98–111)
Creatinine, Ser: 0.9 mg/dL (ref 0.44–1.00)
GFR, Estimated: >60 mL/min (ref >60)
Glucose, Bld: 309 mg/dL — ABNORMAL HIGH (ref 70–99)
Potassium: 4.6 mmol/L (ref 3.5–5.1)
Sodium: 136 mmol/L (ref 135–145)
Total Bilirubin: 0.7 mg/dL (ref 0.3–1.2)
Total Protein: 6.7 g/dL (ref 6.5–8.1)

## 2022-08-10 LAB — CBC
HCT: 38.1 % (ref 36.0–46.0)
Hemoglobin: 12.9 g/dL (ref 12.0–15.0)
MCH: 29.4 pg (ref 26.0–34.0)
MCHC: 33.9 g/dL (ref 30.0–36.0)
MCV: 86.8 fL (ref 80.0–100.0)
Platelets: 257 10*3/uL (ref 150–400)
RBC: 4.39 MIL/uL (ref 3.87–5.11)
RDW: 12.1 % (ref 11.5–15.5)
WBC: 5.4 10*3/uL (ref 4.0–10.5)
nRBC: 0 % (ref 0.0–0.2)

## 2022-08-10 LAB — MAGNESIUM: Magnesium: 2.3 mg/dL (ref 1.7–2.4)

## 2022-08-10 LAB — GLUCOSE, CAPILLARY
Glucose-Capillary: 296 mg/dL — ABNORMAL HIGH (ref 70–99)
Glucose-Capillary: 204 mg/dL — ABNORMAL HIGH (ref 70–99)
Glucose-Capillary: 252 mg/dL — ABNORMAL HIGH (ref 70–99)
Glucose-Capillary: 263 mg/dL — ABNORMAL HIGH (ref 70–99)

## 2022-08-10 MED ORDER — INSULIN GLARGINE-YFGN 100 UNIT/ML ~~LOC~~ SOLN
20.0000 [IU] | Freq: Every day | SUBCUTANEOUS | Status: DC
  Administered 2022-08-10: 20 [IU] via SUBCUTANEOUS
  Filled 2022-08-10 (×2): qty 0.2

## 2022-08-10 NOTE — Progress Notes (Signed)
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 She is scheduled for TED replacement 08/11/22 and at preop evaluatation was found to have serum glucose >600. She endorses polydypsia, polyuria, dry mouth. Other medical problems include COPD/Asthma with last OV 07/22/22, HTN, GERD, hypothyroidism, depression, breast cancer. She was referred by preop to [facility] for evaluation and treatment.  There was concern for diabetic ketoacidosis.  Patient was placed on insulin infusion and hospitalized for further management.  Consultants: None.  Procedures: None    Subjective/Interval History: Patient feels better this morning.  Abdominal pain has resolved.  Has been able to void urine on her own.  Denies any cough or shortness of breath.  Difficulty communicating due to her tracheostomy.     Assessment/Plan:  Diabetic ketoacidosis type II/new onset diabetes mellitus type 2 Patient with no previous history of diabetes.  She has new onset diabetes.  HbA1c is 13.2. Patient was initially on IV insulin.  Anion gap has closed.  She was transitioned to subcutaneous insulin yesterday. CBGs are poorly controlled.  Will increase the dose of her glargine.  Diabetes coordinator to continue to see.  She will need teaching to self administer insulin. Will need to know from diabetes coordinator as to what current insulin she can be prescribed at discharge depending on her insurance.  Abdominal pain Patient was experiencing pain in the right upper quadrant along with suprapubic area.  UA did not suggest infection.  Ultrasound of the right upper quadrant was unremarkable.  A CT scan was done which showed distention of the urinary bladder.  She has been able to void on  her own.  Symptoms have resolved.  Continue to monitor for now.  No further workup at this time. Her LFTs and lipase levels were normal.    Acute respiratory failure with hypoxia/history of laryngeal Cancer/status post laryngectomy Patient had a tracheal esophageal prostheses.  Apparently she expelled this processes back in January after a coughing episode.  Looks like the fistula has closed and the patient has developed increased cough shortness of breath and trouble clearing mucus secretions.  Patient was supposed to undergo replacement of the TE prostheses on 2/26 with [name].  At preop yesterday she was found to have high glucose levels and was sent to the emergency department. Had mucous plugging overnight on 2/23 which resolved after suctioning. Seems to be stable from a respiratory standpoint. Chest x-ray did suggest opacities.  However she is afebrile with normal WBC.  COVID-19 PCR was negative.  Procalcitonin 0.92.  Lactic acid level was normal.  Continue to monitor off of antibiotics. [name] will need to be informed on Monday of her hospitalization.  Hopefully she will be stable enough by then to undergo surgery. Currently on trach collar.  Wean down oxygen to maintain saturations greater than 90%.  Chronic diastolic CHF Echocardiogram from 2019 showed normal systolic function of the left ventricle.  Grade 2 diastolic dysfunction was noted. Seems to be fairly euvolemic.  Hypokalemia/hyponatremia Supplemented.  Magnesium is 2.3.   Low sodium levels secondary to pseudohyponatremia along with some hypovolemia as well.  Improved.  COPD Stable.  Continue nebulizer treatments.  GERD Continue PPI  Essential hypertension Monitor blood pressures.  Noted to be on amlodipine and hydrochlorothiazide and losartan along with metoprolol.  Hypothyroidism Continue levothyroxine.  Major depressive disorder Continue with home medications.  Obesity Estimated body mass index is 31.79  kg/m as calculated from the following:   Height as of this encounter: '5\' 7"'$  (1.702 m).   Weight as of this encounter: 92.1 kg.   DVT Prophylaxis: Lovenox Code Status: Full code Family Communication: Discussed with the patient and her husband Disposition Plan: Hopefully return home when improved  Status is: Inpatient Remains inpatient appropriate because: Diabetic ketoacidosis    Medications: Scheduled:  amLODipine  10 mg Oral Daily   anastrozole  1 mg Oral Daily   enoxaparin (LOVENOX) injection  40 mg Subcutaneous Q24H   escitalopram  20 mg Oral Daily   insulin aspart  0-15 Units Subcutaneous TID WC   insulin aspart  0-5 Units Subcutaneous QHS   insulin glargine-yfgn  20 Units Subcutaneous Daily   ipratropium  0.5 mg Nebulization BID   levothyroxine  175 mcg Oral Q0600   loratadine  10 mg Oral Daily   losartan  100 mg Oral Daily   metoprolol tartrate  50 mg Oral BID   montelukast  10 mg Oral QHS   pantoprazole  80 mg Oral Daily   Continuous:  lactated ringers     [id number], alum & mag hydroxide-simeth, benzonatate, budesonide, dextrose, fluticasone, ipratropium-albuterol, ondansetron, sodium chloride HYPERTONIC  Antibiotics: Anti-infectives (From admission, onward)    None       Objective:  Vital Signs  Vitals:   08/09/22 2300 08/10/22 0253 08/10/22 0755 08/10/22 0824  BP: 104/68  (!) 152/78   Pulse: 66  75 70  Resp: '20 18 17 18  '$ Temp: 97.7 F (36.5 C)  97.7 F (36.5 C)   TempSrc: Oral  Oral   SpO2: 94%  93% 95%  Weight:      Height:        Intake/Output Summary (Last 24 hours) at 08/10/2022 0915 Last data filed at 08/10/2022 0500 Gross per 24 hour  Intake 281.51 ml  Output 500 ml  Net -218.49 ml    Filed Weights   08/08/22 1347  Weight: 92.1 kg    General appearance: Awake alert.  In no distress Tracheostomy noted Resp: Clear to auscultation bilaterally.  Normal effort Cardio: S1-S2 is normal regular.  No S3-S4.  No rubs murmurs or  bruit GI: Abdomen is soft.  Nontender nondistended.  Bowel sounds are present normal.  No masses organomegaly Extremities: No edema.  Full range of motion of lower extremities.    Lab Results:  Data Reviewed: I have personally reviewed following labs and reports of the imaging studies  CBC: Recent Labs  Lab 08/08/22 1119 08/08/22 1400 08/08/22 1423 08/10/22 0246  WBC 7.8 6.8  --  5.4  NEUTROABS  --  4.8  --   --   HGB 14.5 14.5 14.6 12.9  HCT 42.9 41.1 43.0 38.1  MCV 85.0 84.2  --  86.8  PLT 364 347  --  257     Basic Metabolic Panel: Recent Labs  Lab 08/08/22 1624 08/08/22 1950 08/08/22 2220 08/09/22 0510 08/10/22 0246  NA 125* 129* 129* 133* 136  K 4.2 3.2* 5.5* 3.3* 4.6  CL 81* 87* 90* 90* 99  CO2 '25 27 31 31 28  '$ GLUCOSE 704* 494* 287* 176* 309*  BUN 34* 34* 26* 22 14  CREATININE 1.60* 1.38* 0.97 0.88 0.90  CALCIUM 9.6 9.7 9.5 9.8 9.4  MG  --   --   --   --  2.3  GFR: Estimated Creatinine Clearance: 73.6 mL/min (by C-G formula based on SCr of 0.9 mg/dL).    Recent Labs  Lab 08/08/22 1950  LIPASE 50  AMYLASE 42     HbA1C: Recent Labs    08/08/22 1243  HGBA1C 13.2*     CBG: Recent Labs  Lab 08/08/22 2219 08/08/22 2326 08/09/22 1522 08/09/22 1948 08/10/22 0757  GLUCAP 303* 267* 256* 365* 296*      Thyroid Function Tests: Recent Labs    08/08/22 1950  TSH <0.010*      Recent Results (from the past 240 hour(s))  Surgical pcr screen     Status: Abnormal   Collection Time: 08/08/22 11:19 AM   Specimen: Nasal Mucosa; Nasal Swab  Result Value Ref Range Status   MRSA, PCR POSITIVE (A) NEGATIVE Final    Comment: RESULT CALLED TO, READ BACK BY AND VERIFIED WITH: RN Renda Rolls [id number] AT [id number] BY EC    Staphylococcus aureus POSITIVE (A) NEGATIVE Final    Comment: (NOTE) The Xpert SA Assay (FDA approved for NASAL specimens in patients 65 years of age and older), is one component of a comprehensive surveillance program.  It is not intended to diagnose infection nor to guide or monitor treatment. Performed at [facility], [place] [address] [place], [place] [address]   SARS CORONAVIRUS 2 (TAT 6-24 HRS) Anterior Nasal Swab     Status: None   Collection Time: 08/08/22 11:19 AM   Specimen: Anterior Nasal Swab  Result Value Ref Range Status   SARS Coronavirus 2 NEGATIVE NEGATIVE Final    Comment: (NOTE) SARS-CoV-2 target nucleic acids are NOT DETECTED.  The SARS-CoV-2 RNA is generally detectable in upper and lower respiratory specimens during the acute phase of infection. Negative results do not preclude SARS-CoV-2 infection, do not rule out co-infections with other pathogens, and should not be used as the sole basis for treatment or other patient management decisions. Negative results must be combined with clinical observations, patient history, and epidemiological information. The expected result is Negative.  Fact Sheet for Patients: SugarRoll.be  Fact Sheet for Healthcare Providers: https://www.woods-mathews.com/  This test is not yet approved or cleared by the [place] FDA and  has been authorized for detection and/or diagnosis of SARS-CoV-2 by FDA under an Emergency Use Authorization (EUA). This EUA will remain  in effect (meaning this test can be used) for the duration of the COVID-19 declaration under Se ction 564(b)(1) of the Act, 21 U.S.C. section 360bbb-3(b)(1), unless the authorization is terminated or revoked sooner.  Performed at [facility], [place] [address] [place], [place] [address]   Culture, blood (routine x 2)     Status: None (Preliminary result)   Collection Time: 08/08/22  4:03 PM   Specimen: BLOOD  Result Value Ref Range Status   Specimen Description BLOOD RIGHT ANTECUBITAL  Final   Special Requests   Final    BOTTLES DRAWN AEROBIC AND ANAEROBIC Blood Culture adequate volume   Culture   Final    NO GROWTH 2  DAYS Performed at [facility], [place] [address] [place], [place] [address]    Report Status PENDING  Incomplete  Culture, blood (routine x 2)     Status: None (Preliminary result)   Collection Time: 08/08/22  4:10 PM   Specimen: BLOOD  Result Value Ref Range Status   Specimen Description BLOOD LEFT ANTECUBITAL  Final   Special Requests   Final    BOTTLES DRAWN AEROBIC AND  ANAEROBIC Blood Culture results may not be optimal due to an inadequate volume of blood received in culture bottles   Culture   Final    NO GROWTH 2 DAYS Performed at [facility] [address], [place], [place] [address]    Report Status PENDING  Incomplete      Radiology Studies: ECHOCARDIOGRAM COMPLETE  Result Date: 08/09/2022    ECHOCARDIOGRAM REPORT   Patient Name:   Deborah [name] [facility] Date of Exam: 08/09/2022 Medical Rec #:  [id number]             Height:       67.0 in Accession #:    [id number]            Weight:       203.0 lb Date of Birth:  Jul 14, 1955             BSA:          2.035 m Patient Age:    72 years              BP:           114/91 mmHg Patient Gender: F                     HR:           91 bpm. Exam Location:  Inpatient Procedure: 2D Echo, Cardiac Doppler and Color Doppler Indications:    acute diastolic chf  History:        Patient has prior history of Echocardiogram examinations, most                 recent 08/31/2017. COPD; Risk Factors:Hypertension and Former                 Smoker.  Sonographer:    Johny Chess RDCS Referring Phys: Ranger Comments: Image acquisition challenging due to patient body habitus. IMPRESSIONS  1. Maximal thickness assessed 2 cm (anterior PSAX imaging). No LVOT Obstruction. Left ventricular ejection fraction, by estimation, is 70 to 75%. The left ventricle has hyperdynamic function. The left ventricle has no regional wall motion abnormalities.  There is severe concentric left ventricular hypertrophy. Left ventricular diastolic  parameters are consistent with Grade I diastolic dysfunction (impaired relaxation).  2. Right ventricular systolic function is hyperdynamic. The right ventricular size is normal. There is moderately elevated pulmonary artery systolic pressure. The estimated right ventricular systolic pressure is 0000000 mmHg.  3. A small pericardial effusion is present. The pericardial effusion is posterior to the left ventricle.  4. The mitral valve is grossly normal. No evidence of mitral valve regurgitation. No evidence of mitral stenosis.  5. TR best assesed in parasternal imaging; discrepant finding on apical imaging. Tricuspid valve regurgitation is moderate at least.  6. 2D Vena Contracta 4 mm. Central aortic regurgitation. The aortic valve is tricuspid. Aortic valve regurgitation is moderate. Aortic regurgitation PHT measures 336 msec. Comparison(s): Tricuspid regurgitation may have increased from 2019 (report only). Conclusion(s)/Recommendation(s): If clinically indicated, consider outpatient assessment of hypertrophy ([facility] imaging vs CMR). FINDINGS  Left Ventricle: Maximal thickness assessed 2 cm (anterior PSAX imaging). No LVOT Obstruction. Left ventricular ejection fraction, by estimation, is 70 to 75%. The left ventricle has hyperdynamic function. The left ventricle has no regional wall motion abnormalities. The left ventricular internal cavity size was normal in size. There is severe concentric left ventricular hypertrophy. Left ventricular diastolic parameters are consistent with Grade I diastolic dysfunction (  impaired relaxation). Right Ventricle: The right ventricular size is normal. Right vetricular wall thickness was not well visualized. Right ventricular systolic function is hyperdynamic. There is moderately elevated pulmonary artery systolic pressure. The tricuspid regurgitant velocity is 3.27 m/s, and with an assumed right atrial pressure of 3 mmHg, the estimated right ventricular systolic pressure is 0000000 mmHg.  Left Atrium: Left atrial size was normal in size. Right Atrium: Right atrial size was normal in size. Pericardium: A small pericardial effusion is present. The pericardial effusion is posterior to the left ventricle. Mitral Valve: The mitral valve is grossly normal. No evidence of mitral valve regurgitation. No evidence of mitral valve stenosis. Tricuspid Valve: TR best assesed in parasternal imaging; discrepant finding on apical imaging. The tricuspid valve is grossly normal. Tricuspid valve regurgitation is moderate . No evidence of tricuspid stenosis. Aortic Valve: 2D Vena Contracta 4 mm. Central aortic regurgitation. The aortic valve is tricuspid. Aortic valve regurgitation is moderate. Aortic regurgitation PHT measures 336 msec. Pulmonic Valve: The pulmonic valve was normal in structure. Pulmonic valve regurgitation is trivial. No evidence of pulmonic stenosis. Aorta: The aortic root and ascending aorta are structurally normal, with no evidence of dilitation. IAS/Shunts: The interatrial septum was not well visualized.  LEFT VENTRICLE PLAX 2D LVIDd:         3.70 cm   Diastology LVIDs:         2.40 cm   LV e' medial:    5.11 cm/s LV PW:         1.40 cm   LV E/e' medial:  12.1 LV IVS:        1.70 cm   LV e' lateral:   7.18 cm/s LVOT diam:     2.10 cm   LV E/e' lateral: 8.6 LV SV:         69 LV SV Index:   34 LVOT Area:     3.46 cm  RIGHT VENTRICLE RV Basal diam:  2.30 cm RV S prime:     18.60 cm/s TAPSE (M-mode): 2.6 cm LEFT ATRIUM             Index        RIGHT ATRIUM           Index LA diam:        4.00 cm 1.97 cm/m   RA Area:     11.40 cm LA Vol (A2C):   19.3 ml 9.48 ml/m   RA Volume:   23.70 ml  11.65 ml/m LA Vol (A4C):   38.3 ml 18.82 ml/m LA Biplane Vol: 27.8 ml 13.66 ml/m  AORTIC VALVE LVOT Vmax:   123.00 cm/s LVOT Vmean:  80.700 cm/s LVOT VTI:    0.198 m AI PHT:      336 msec  AORTA Ao Root diam: 2.50 cm Ao Asc diam:  3.20 cm MITRAL VALVE                TRICUSPID VALVE MV Area (PHT): 4.49 cm     TR  Peak grad:   42.8 mmHg MV Decel Time: 169 msec     TR Vmax:        327.00 cm/s MV E velocity: 61.80 cm/s MV A velocity: 105.00 cm/s  SHUNTS MV E/A ratio:  0.59         Systemic VTI:  0.20 m  Systemic Diam: 2.10 cm Rudean Haskell MD Electronically signed by Rudean Haskell MD Signature Date/Time: 08/09/2022/1:34:09 PM    Final    CT ABDOMEN PELVIS W CONTRAST  Result Date: 08/09/2022 CLINICAL DATA:  Abdominal pain, hyperglycemia. EXAM: CT ABDOMEN AND PELVIS WITH CONTRAST TECHNIQUE: Multidetector CT imaging of the abdomen and pelvis was performed using the standard protocol following bolus administration of intravenous contrast. RADIATION DOSE REDUCTION: This exam was performed according to the departmental dose-optimization program which includes automated exposure control, adjustment of the mA and/or kV according to patient size and/or use of iterative reconstruction technique. CONTRAST:  14m OMNIPAQUE IOHEXOL 350 MG/ML SOLN COMPARISON:  CT abdomen dated 11/20/2010. FINDINGS: Lower chest: Segmental airspace collapse within the medial aspects of the LEFT lower lobe, incompletely imaged, also present on chest CT angiogram of 08/29/2021. Hepatobiliary: Liver is diffusely low in density indicating fatty infiltration. No focal mass or lesion is seen within the liver. Gallbladder appears normal. No bile duct dilatation is seen. Pancreas: Unremarkable. No pancreatic ductal dilatation or surrounding inflammatory changes. Spleen: Normal in size without focal abnormality. Adrenals/Urinary Tract: Adrenal glands appear normal. Mild bilateral hydronephrosis. Bladder is moderately distended but otherwise unremarkable. No renal or ureteral calculi. No perinephric inflammation. Stomach/Bowel: No dilated large or small bowel loops. No evidence of focal or generalized bowel wall inflammation. Appendix appears normal. Scattered diverticulosis of the sigmoid and descending colon but no focal  inflammatory changes seen to suggest acute diverticulitis. Stomach is unremarkable. Vascular/Lymphatic: No abdominal aortic aneurysm. No acute-appearing vascular abnormality. IVC appears somewhat decompressed suggesting hypovolemia. No enlarged lymph nodes are identified in the abdomen or pelvis. Reproductive: Probable uterine fibroid at the dome of the fundus. No adnexal mass or free fluid. Other: No free fluid or abscess collection is seen. No free intraperitoneal air. Musculoskeletal: Degenerative spondylosis of the slightly scoliotic thoracolumbar spine, moderate in degree. No acute-appearing osseous abnormality. Superficial soft tissues of the abdomen and pelvis are unremarkable. IMPRESSION: 1. Bladder is moderately distended causing mild bilateral hydronephrosis. Kidneys, ureters and bladder are otherwise unremarkable. No renal or ureteral calculi. 2. IVC appears somewhat decompressed suggesting hypovolemia. 3. No bowel obstruction or evidence of bowel wall inflammation. No evidence of pyelonephritis. No free fluid or abscess collection. No free intraperitoneal air. 4. Colonic diverticulosis without evidence of acute diverticulitis. 5. Fatty infiltration of the liver. 6. Probable uterine fibroid. Electronically Signed   By: SFranki CabotM.D.   On: 08/09/2022 11:14   [place]Abdomen Limited RUQ (LIVER/GB)  Result Date: 08/08/2022 CLINICAL DATA:  Right upper quadrant abdominal pain. EXAM: ULTRASOUND ABDOMEN LIMITED RIGHT UPPER QUADRANT COMPARISON:  None Available. FINDINGS: Gallbladder: No gallstones or wall thickening visualized. No sonographic Murphy sign noted by sonographer. Common bile duct: Diameter: 3 mm, normal Liver: Diffusely increased hepatic parenchymal echotexture suggesting fatty infiltration. No focal lesions are identified. Portal vein is patent on color Doppler imaging with normal direction of blood flow towards the liver. Other: Examination is somewhat technically limited due to patient body  habitus and current condition. IMPRESSION: Diffuse fatty infiltration of the liver. No evidence of cholelithiasis or acute cholecystitis. Electronically Signed   By: WLucienne CapersM.D.   On: 08/08/2022 20:14   DG Chest 2 View  Result Date: 08/08/2022 CLINICAL DATA:  SOB EXAM: CHEST - 2 VIEW COMPARISON:  06/06/22 CXR FINDINGS: No pleural effusion. No pneumothorax. Normal cardiac and mediastinal contours. There are new patchy bibasilar opacities, right-greater-than-left. Radiographically apparent displaced rib fractures. Visualized upper is unremarkable. Vertebral body heights are maintained. IMPRESSION:  New patchy bibasilar opacities, right-greater-than-left, which could represent aspiration or infection. Electronically Signed   By: Marin Roberts M.D.   On: 08/08/2022 15:11       LOS: 2 days   [facility] Pager on [email or web address]  08/10/2022, 9:15 AM

## 2022-08-11 ENCOUNTER — Ambulatory Visit (HOSPITAL_COMMUNITY): Admission: RE | Admit: 2022-08-11 | Payer: Medicaid Other | Source: Home / Self Care | Admitting: Otolaryngology

## 2022-08-11 ENCOUNTER — Encounter (HOSPITAL_COMMUNITY): Payer: Self-pay | Admitting: Internal Medicine

## 2022-08-11 ENCOUNTER — Inpatient Hospital Stay (HOSPITAL_COMMUNITY): Payer: Medicaid Other | Admitting: Anesthesiology

## 2022-08-11 ENCOUNTER — Other Ambulatory Visit: Payer: Self-pay

## 2022-08-11 ENCOUNTER — Encounter (HOSPITAL_COMMUNITY)
Admission: EM | Disposition: A | Discharge status: Home / Self Care | Payer: Self-pay | Source: Home / Self Care | Attending: Internal Medicine

## 2022-08-11 DIAGNOSIS — K222 Esophageal obstruction: Secondary | ICD-10-CM | POA: Diagnosis not present

## 2022-08-11 DIAGNOSIS — I509 Heart failure, unspecified: Secondary | ICD-10-CM | POA: Diagnosis not present

## 2022-08-11 DIAGNOSIS — E1110 Type 2 diabetes mellitus with ketoacidosis without coma: Secondary | ICD-10-CM | POA: Diagnosis not present

## 2022-08-11 DIAGNOSIS — Z87891 Personal history of nicotine dependence: Secondary | ICD-10-CM

## 2022-08-11 DIAGNOSIS — E1165 Type 2 diabetes mellitus with hyperglycemia: Secondary | ICD-10-CM | POA: Diagnosis not present

## 2022-08-11 DIAGNOSIS — I110 Hypertensive heart disease with heart failure: Secondary | ICD-10-CM

## 2022-08-11 DIAGNOSIS — Z923 Personal history of irradiation: Secondary | ICD-10-CM

## 2022-08-11 HISTORY — PX: TRACHEAL ESOPHAGEAL PROSTHESIS (TEP) CHANGE: SHX6131

## 2022-08-11 HISTORY — PX: ESOPHAGOSCOPY: SHX5534

## 2022-08-11 LAB — TYPE AND SCREEN
ABO/RH(D): A NEG
Antibody Screen: NEGATIVE

## 2022-08-11 LAB — GLUCOSE, CAPILLARY
Glucose-Capillary: 140 mg/dL — ABNORMAL HIGH (ref 70–99)
Glucose-Capillary: 145 mg/dL — ABNORMAL HIGH (ref 70–99)
Glucose-Capillary: 159 mg/dL — ABNORMAL HIGH (ref 70–99)
Glucose-Capillary: 172 mg/dL — ABNORMAL HIGH (ref 70–99)
Glucose-Capillary: 173 mg/dL — ABNORMAL HIGH (ref 70–99)
Glucose-Capillary: 186 mg/dL — ABNORMAL HIGH (ref 70–99)
Glucose-Capillary: 190 mg/dL — ABNORMAL HIGH (ref 70–99)
Glucose-Capillary: 192 mg/dL — ABNORMAL HIGH (ref 70–99)
Glucose-Capillary: 194 mg/dL — ABNORMAL HIGH (ref 70–99)
Glucose-Capillary: 197 mg/dL — ABNORMAL HIGH (ref 70–99)
Glucose-Capillary: 201 mg/dL — ABNORMAL HIGH (ref 70–99)
Glucose-Capillary: 221 mg/dL — ABNORMAL HIGH (ref 70–99)
Glucose-Capillary: 226 mg/dL — ABNORMAL HIGH (ref 70–99)
Glucose-Capillary: 227 mg/dL — ABNORMAL HIGH (ref 70–99)
Glucose-Capillary: 233 mg/dL — ABNORMAL HIGH (ref 70–99)
Glucose-Capillary: 240 mg/dL — ABNORMAL HIGH (ref 70–99)
Glucose-Capillary: 263 mg/dL — ABNORMAL HIGH (ref 70–99)
Glucose-Capillary: 290 mg/dL — ABNORMAL HIGH (ref 70–99)

## 2022-08-11 LAB — COMPREHENSIVE METABOLIC PANEL
ALT: 83 U/L — ABNORMAL HIGH (ref 0–44)
AST: 95 U/L — ABNORMAL HIGH (ref 15–41)
Albumin: 3.4 g/dL — ABNORMAL LOW (ref 3.5–5.0)
Alkaline Phosphatase: 121 U/L (ref 38–126)
Anion gap: 11 (ref 5–15)
BUN: 11 mg/dL (ref 8–23)
CO2: 25 mmol/L (ref 22–32)
Calcium: 9.8 mg/dL (ref 8.9–10.3)
Chloride: 98 mmol/L (ref 98–111)
Creatinine, Ser: 0.81 mg/dL (ref 0.44–1.00)
GFR, Estimated: >60 mL/min (ref >60)
Glucose, Bld: 268 mg/dL — ABNORMAL HIGH (ref 70–99)
Potassium: 4.1 mmol/L (ref 3.5–5.1)
Sodium: 134 mmol/L — ABNORMAL LOW (ref 135–145)
Total Bilirubin: 0.7 mg/dL (ref 0.3–1.2)
Total Protein: 7.7 g/dL (ref 6.5–8.1)

## 2022-08-11 LAB — T4, FREE
Free T4: 2.12 ng/dL — ABNORMAL HIGH (ref 0.61–1.12)
Free T4: 2.3 ng/dL — ABNORMAL HIGH (ref 0.61–1.12)

## 2022-08-11 LAB — TSH: TSH: <0.01 u[IU]/mL — ABNORMAL LOW (ref 0.350–4.500)

## 2022-08-11 SURGERY — ESOPHAGOSCOPY
Anesthesia: General

## 2022-08-11 MED ORDER — CHLORHEXIDINE GLUCONATE 0.12 % MT SOLN
15.0000 mL | Freq: Once | OROMUCOSAL | Status: AC
  Administered 2022-08-11: 15 mL via OROMUCOSAL

## 2022-08-11 MED ORDER — MORPHINE SULFATE (PF) 2 MG/ML IV SOLN
1.0000 mg | INTRAVENOUS | Status: DC | PRN
  Administered 2022-08-11: 1 mg via INTRAVENOUS
  Filled 2022-08-11: qty 1

## 2022-08-11 MED ORDER — SUCCINYLCHOLINE CHLORIDE 200 MG/10ML IV SOSY
PREFILLED_SYRINGE | INTRAVENOUS | Status: DC | PRN
  Administered 2022-08-11: 100 mg via INTRAVENOUS

## 2022-08-11 MED ORDER — EPHEDRINE SULFATE-NACL 50-0.9 MG/10ML-% IV SOSY
PREFILLED_SYRINGE | INTRAVENOUS | Status: DC | PRN
  Administered 2022-08-11 (×2): 10 mg via INTRAVENOUS

## 2022-08-11 MED ORDER — EPHEDRINE 5 MG/ML INJ
INTRAVENOUS | Status: AC
  Filled 2022-08-11: qty 5

## 2022-08-11 MED ORDER — ONDANSETRON HCL 4 MG/2ML IJ SOLN: 4.0000 mg | Freq: Once | INTRAMUSCULAR | Status: DC | PRN

## 2022-08-11 MED ORDER — INSULIN ASPART 100 UNIT/ML IJ SOLN
0.0000 [IU] | INTRAMUSCULAR | Status: DC
  Administered 2022-08-11: 2 [IU] via SUBCUTANEOUS
  Administered 2022-08-11: 5 [IU] via SUBCUTANEOUS
  Administered 2022-08-11: 8 [IU] via SUBCUTANEOUS
  Administered 2022-08-11: 5 [IU] via SUBCUTANEOUS
  Administered 2022-08-12: 8 [IU] via SUBCUTANEOUS

## 2022-08-11 MED ORDER — PHENYLEPHRINE 80 MCG/ML (10ML) SYRINGE FOR IV PUSH (FOR BLOOD PRESSURE SUPPORT)
PREFILLED_SYRINGE | INTRAVENOUS | Status: DC | PRN
  Administered 2022-08-11: 80 ug via INTRAVENOUS

## 2022-08-11 MED ORDER — INSULIN GLARGINE-YFGN 100 UNIT/ML ~~LOC~~ SOLN
12.0000 [IU] | Freq: Two times a day (BID) | SUBCUTANEOUS | Status: DC
  Administered 2022-08-11: 12 [IU] via SUBCUTANEOUS
  Filled 2022-08-11 (×3): qty 0.12

## 2022-08-11 MED ORDER — KETOROLAC TROMETHAMINE 30 MG/ML IJ SOLN: 30.0000 mg | Freq: Once | INTRAMUSCULAR | Status: DC | PRN

## 2022-08-11 MED ORDER — OXYCODONE HCL 5 MG PO TABS
5.0000 mg | ORAL_TABLET | Freq: Four times a day (QID) | ORAL | Status: DC | PRN
  Administered 2022-08-12: 5 mg via ORAL
  Filled 2022-08-11: qty 1

## 2022-08-11 MED ORDER — ORAL CARE MOUTH RINSE: 15.0000 mL | Freq: Once | OROMUCOSAL | Status: AC

## 2022-08-11 MED ORDER — INSULIN ASPART 100 UNIT/ML IJ SOLN
INTRAMUSCULAR | Status: AC
  Filled 2022-08-11: qty 1

## 2022-08-11 MED ORDER — MUPIROCIN 2 % EX OINT
1.0000 | TOPICAL_OINTMENT | Freq: Two times a day (BID) | CUTANEOUS | Status: DC
  Administered 2022-08-11 – 2022-08-12 (×3): 1 via NASAL
  Filled 2022-08-11: qty 22

## 2022-08-11 MED ORDER — EPINEPHRINE HCL (NASAL) 0.1 % NA SOLN
NASAL | Status: AC
  Filled 2022-08-11: qty 30

## 2022-08-11 MED ORDER — FENTANYL CITRATE (PF) 250 MCG/5ML IJ SOLN
INTRAMUSCULAR | Status: AC
  Filled 2022-08-11: qty 5

## 2022-08-11 MED ORDER — ONDANSETRON HCL 4 MG/2ML IJ SOLN
INTRAMUSCULAR | Status: DC | PRN
  Administered 2022-08-11: 4 mg via INTRAVENOUS

## 2022-08-11 MED ORDER — SUCCINYLCHOLINE CHLORIDE 200 MG/10ML IV SOSY
PREFILLED_SYRINGE | INTRAVENOUS | Status: AC
  Filled 2022-08-11: qty 10

## 2022-08-11 MED ORDER — MIDAZOLAM HCL 5 MG/5ML IJ SOLN
INTRAMUSCULAR | Status: DC | PRN
  Administered 2022-08-11: 2 mg via INTRAVENOUS

## 2022-08-11 MED ORDER — LACTATED RINGERS IV SOLN: INTRAVENOUS | Status: DC

## 2022-08-11 MED ORDER — PROPOFOL 10 MG/ML IV BOLUS
INTRAVENOUS | Status: AC
  Filled 2022-08-11: qty 20

## 2022-08-11 MED ORDER — LIDOCAINE-EPINEPHRINE 1 %-1:100000 IJ SOLN
INTRAMUSCULAR | Status: AC
  Filled 2022-08-11: qty 1

## 2022-08-11 MED ORDER — INSULIN ASPART 100 UNIT/ML IJ SOLN
0.0000 [IU] | INTRAMUSCULAR | Status: DC | PRN
  Administered 2022-08-11: 8 [IU] via SUBCUTANEOUS

## 2022-08-11 MED ORDER — FENTANYL CITRATE (PF) 100 MCG/2ML IJ SOLN
INTRAMUSCULAR | Status: DC | PRN
  Administered 2022-08-11: 50 ug via INTRAVENOUS
  Administered 2022-08-11: 25 ug via INTRAVENOUS

## 2022-08-11 MED ORDER — MIDAZOLAM HCL 2 MG/2ML IJ SOLN
INTRAMUSCULAR | Status: AC
  Filled 2022-08-11: qty 2

## 2022-08-11 MED ORDER — ONDANSETRON HCL 4 MG/2ML IJ SOLN
INTRAMUSCULAR | Status: AC
  Filled 2022-08-11: qty 2

## 2022-08-11 MED ORDER — CHLORHEXIDINE GLUCONATE CLOTH 2 % EX PADS
6.0000 | MEDICATED_PAD | Freq: Every day | CUTANEOUS | Status: DC
  Administered 2022-08-11 – 2022-08-12 (×2): 6 via TOPICAL

## 2022-08-11 MED ORDER — PROPOFOL 10 MG/ML IV BOLUS
INTRAVENOUS | Status: DC | PRN
  Administered 2022-08-11: 30 mg via INTRAVENOUS
  Administered 2022-08-11: 50 mg via INTRAVENOUS
  Administered 2022-08-11: 30 mg via INTRAVENOUS
  Administered 2022-08-11: 120 mg via INTRAVENOUS
  Administered 2022-08-11: 50 mg via INTRAVENOUS

## 2022-08-11 MED ORDER — FENTANYL CITRATE (PF) 100 MCG/2ML IJ SOLN: 25.0000 ug | INTRAMUSCULAR | Status: DC | PRN

## 2022-08-11 MED ORDER — LIDOCAINE 2% (20 MG/ML) 5 ML SYRINGE
INTRAMUSCULAR | Status: DC | PRN
  Administered 2022-08-11: 100 mg via INTRAVENOUS

## 2022-08-11 SURGICAL SUPPLY — 24 items
BAG COUNTER SPONGE SURGICOUNT (BAG) ×1 IMPLANT
BAG SPNG CNTER NS LX DISP (BAG) ×1
BALLN PULM 15 16.5 18X75 (BALLOONS)
BALLOON PULM 15 16.5 18X75 (BALLOONS) IMPLANT
CANISTER SUCT 3000ML PPV (MISCELLANEOUS) ×1 IMPLANT
COVER BACK TABLE 60X90IN (DRAPES) ×1 IMPLANT
DRAPE HALF SHEET 40X57 (DRAPES) ×1 IMPLANT
GAUZE SPONGE 4X4 12PLY STRL (GAUZE/BANDAGES/DRESSINGS) IMPLANT
GLOVE ECLIPSE 7.5 STRL STRAW (GLOVE) ×1 IMPLANT
GUARD TEETH (MISCELLANEOUS) IMPLANT
KIT BASIN OR (CUSTOM PROCEDURE TRAY) ×1 IMPLANT
KIT TURNOVER KIT B (KITS) ×1 IMPLANT
NDL PRECISIONGLIDE 27X1.5 (NEEDLE) IMPLANT
NEEDLE PRECISIONGLIDE 27X1.5 (NEEDLE) IMPLANT
NS IRRIG 1000ML POUR BTL (IV SOLUTION) ×1 IMPLANT
PAD ARMBOARD 7.5X6 YLW CONV (MISCELLANEOUS) ×2 IMPLANT
PATTIES SURGICAL .5 X3 (DISPOSABLE) IMPLANT
PROSTHESIS PROVOX VEGA 17FR (Prosthesis and Implant ENT) IMPLANT
SPECIMEN JAR SMALL (MISCELLANEOUS) IMPLANT
SYR CONTROL 10ML LL (SYRINGE) IMPLANT
SYR TB 1ML LUER SLIP (SYRINGE) IMPLANT
TOWEL GREEN STERILE FF (TOWEL DISPOSABLE) ×1 IMPLANT
TUBE CONNECTING 12X1/4 (SUCTIONS) ×1 IMPLANT
WATER STERILE IRR 1000ML POUR (IV SOLUTION) ×1 IMPLANT

## 2022-08-11 NOTE — Evaluation (Signed)
Occupational Therapy Evaluation and Discharge Patient Details Name: Deborah [name] MRN: [id number] DOB: 02/01/1956 Today's Date: 08/11/2022   History of Present Illness Pt is 67 yo female who expelled her TEP and was scheduled for TED replacement on 08/11/22. At pre-op evaluation was found to have serum glucose >600. Newly diagnosed with DM II. PMH: COPD/asthma, HTN, GERD, hypothyroidism, depression, breast cancer.   Clinical Impression   Pt is functioning independently. No OT needs. Pt is requesting at tub seat.      Recommendations for follow up therapy are one component of a multi-disciplinary discharge planning process, led by the attending physician.  Recommendations may be updated based on patient status, additional functional criteria and insurance authorization.   Follow Up Recommendations  No OT follow up     Assistance Recommended at Discharge PRN  Patient can return home with the following Assist for transportation    Functional Status Assessment  Patient has had a recent decline in their functional status and demonstrates the ability to make significant improvements in function in a reasonable and predictable amount of time.  Equipment Recommendations  Tub/shower seat    Recommendations for Other Services       Precautions / Restrictions Precautions Precautions: None Restrictions Weight Bearing Restrictions: No      Mobility Bed Mobility Overal bed mobility: Independent                  Transfers Overall transfer level: Independent Equipment used: None                      Balance Overall balance assessment: No apparent balance deficits (not formally assessed)                                         ADL either performed or assessed with clinical judgement   ADL Overall ADL's : Independent                                             Vision Baseline Vision/History: 1 Wears glasses Ability to  See in Adequate Light: 0 Adequate Patient Visual Report: No change from baseline       Perception     Praxis      Pertinent Vitals/Pain Pain Assessment Pain Assessment: No/denies pain     Hand Dominance Right   Extremity/Trunk Assessment Upper Extremity Assessment Upper Extremity Assessment: Overall WFL for tasks assessed   Lower Extremity Assessment Lower Extremity Assessment: Overall WFL for tasks assessed   Cervical / Trunk Assessment Cervical / Trunk Assessment: Normal   Communication Communication Communication: Tracheostomy   Cognition Arousal/Alertness: Awake/alert Behavior During Therapy: WFL for tasks assessed/performed Overall Cognitive Status: Within Functional Limits for tasks assessed                                       General Comments  Pt is very aware of her medical conditions and her mobility status.    Exercises     Shoulder Instructions      Home Living Family/patient expects to be discharged to:: Private residence Living Arrangements: Spouse/significant other Available Help at Discharge: Family Type of Home: House Home Access: Stairs to  enter Entrance Stairs-Number of Steps: 3 Entrance Stairs-Rails: Right Home Layout: One level     Bathroom Shower/Tub: Occupational psychologist: Standard     Home Equipment: Conservation officer, nature (2 wheels)          Prior Functioning/Environment Prior Level of Function : Independent/Modified Independent;Working/employed;Driving             Mobility Comments: 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 ADLs Comments: Pt was independent with all ADL"s.        OT Problem List:        OT Treatment/Interventions:      OT Goals(Current goals can be found in the care plan section)    OT Frequency:      Co-evaluation              AM-PAC OT "6 Clicks" Daily Activity     Outcome Measure Help from another person eating meals?:  None Help from another person taking care of personal grooming?: None Help from another person toileting, which includes using toliet, bedpan, or urinal?: None Help from another person bathing (including washing, rinsing, drying)?: None Help from another person to put on and taking off regular upper body clothing?: None Help from another person to put on and taking off regular lower body clothing?: None 6 Click Score: 24   End of Session    Activity Tolerance: Patient tolerated treatment well Patient left: in bed;with call bell/phone within reach  OT Visit Diagnosis: Muscle weakness (generalized) (M62.81)                Time: [id number] OT Time Calculation (min): 18 min Charges:  OT General Charges $OT Visit: 1 Visit OT Evaluation $OT Eval Low Complexity: 1 Low  Cleta Alberts, OTR/L Acute Rehabilitation Services Office: [phone]  [name] 08/11/2022, 4:18 PM

## 2022-08-11 NOTE — Plan of Care (Signed)

## 2022-08-11 NOTE — TOC Progression Note (Signed)
Transition of Care [facility]) - Progression Note    Patient Details  Name: Deborah [name] MRN: [id number] Date of Birth: May 07, 1956  [facility] CM/SW Contact  Zenon Mayo, RN Phone Number: 08/11/2022, 4:39 PM  Clinical Narrative:    From home, presents with diabetic ketoacidosis. Patient was placed on insulin infusion and hospitalized for further management. TOC following.        Expected Discharge Plan and Services                                               Social Determinants of Health (SDOH) Interventions SDOH Screenings   Food Insecurity: No Food Insecurity (08/09/2022)  Housing: Low Risk  (08/09/2022)  Transportation Needs: No Transportation Needs (08/09/2022)  Utilities: Not At Risk (08/09/2022)  Depression (PHQ2-9): Low Risk  (07/03/2022)  Recent Concern: Depression (PHQ2-9) - Medium Risk (06/03/2022)  Tobacco Use: Medium Risk (08/11/2022)    Readmission Risk Interventions     No data to display

## 2022-08-11 NOTE — Evaluation (Signed)
Physical Therapy Evaluation Patient Details Name: Deborah [name] MRN: [id number] DOB: Apr 14, 1956 Today's Date: 08/11/2022  History of Present Illness  Pt is 67 yo female who expelled her TEP and was scheduled for TED replacement on 08/11/22. At pre-op evaluation was found to have serum glucose >600. Newly diagnosed with DM II. PMH: COPD/asthma, HTN, GERD, hypothyroidism, depression, breast cancer.  Clinical Impression  Pt is at baseline currently. She has no apparent balance deficits or mobility deficits. Pt was hooked to IV on bed so unable to progress ambulation or perform stairs today but pt was able to march with high knees at EOB and perform balance challenge with twisting at the hips picking up alternating feet without LOB. Demonstrates ability to navigate stairs per home set up without difficulty. Pt is very aware of her health care condition and is not at a high risk for falls. Due to pt current functional status vs. PLOF no recommended physical therapy at this time. Pt will be discharged from skilled physical therapy services; please re-consult if further needs arise.      Recommendations for follow up therapy are one component of a multi-disciplinary discharge planning process, led by the attending physician.  Recommendations may be updated based on patient status, additional functional criteria and insurance authorization.  Follow Up Recommendations No PT follow up      Assistance Recommended at Discharge PRN  Patient can return home with the following       Equipment Recommendations Other (comment) (shower chair)  Recommendations for Other Services       Functional Status Assessment Patient has not had a recent decline in their functional status     Precautions / Restrictions Precautions Precautions: None Restrictions Weight Bearing Restrictions: No      Mobility  Bed Mobility Overal bed mobility: Independent             General bed mobility comments: HOB  mostly down, minimal use of bed rails. Patient Response: Cooperative  Transfers Overall transfer level: Independent Equipment used: None               General transfer comment: P performed sit to stand without difficulty from hospital bed at lowest level.    Ambulation/Gait Ambulation/Gait assistance: Independent             General Gait Details: Pt IV was hooked to bed so did not ambulate for a distance pt marched in place without difficulty no LOB, demonstrates good balance and strength for gait without an AD.  Stairs            Wheelchair Mobility    Modified Rankin (Stroke Patients Only)       Balance Overall balance assessment: No apparent balance deficits (not formally assessed)           Pertinent Vitals/Pain Pain Assessment Pain Assessment: No/denies pain    Home Living Family/patient expects to be discharged to:: Private residence Living Arrangements: Spouse/significant other Available Help at Discharge: Family Type of Home: House Home Access: Stairs to enter Entrance Stairs-Rails: Right Entrance Stairs-Number of Steps: 3   Home Layout: One level Home Equipment: Conservation officer, nature (2 wheels)      Prior Function Prior Level of Function : Independent/Modified Independent;Working/employed;Driving             Mobility Comments: 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 ADLs Comments: Pt was independent with all ADL"s.     Hand Dominance  Extremity/Trunk Assessment   Upper Extremity Assessment Upper Extremity Assessment: Defer to OT evaluation    Lower Extremity Assessment Lower Extremity Assessment: Overall WFL for tasks assessed    Cervical / Trunk Assessment Cervical / Trunk Assessment: Normal  Communication   Communication: Tracheostomy  Cognition Arousal/Alertness: Awake/alert Behavior During Therapy: WFL for tasks assessed/performed Overall Cognitive Status: Within Functional  Limits for tasks assessed          General Comments General comments (skin integrity, edema, etc.): Pt is very aware of her medical conditions and her mobility status.        Assessment/Plan    PT Assessment Patient does not need any further PT services  PT Problem List         PT Treatment Interventions      PT Goals (Current goals can be found in the Care Plan section)  Acute Rehab PT Goals PT Goal Formulation: All assessment and education complete, DC therapy    Frequency          AM-PAC PT "6 Clicks" Mobility  Outcome Measure Help needed turning from your back to your side while in a flat bed without using bedrails?: None Help needed moving from lying on your back to sitting on the side of a flat bed without using bedrails?: None Help needed moving to and from a bed to a chair (including a wheelchair)?: None Help needed standing up from a chair using your arms (e.g., wheelchair or bedside chair)?: None Help needed to walk in hospital room?: None Help needed climbing 3-5 steps with a railing? : None 6 Click Score: 24    End of Session   Activity Tolerance: Patient tolerated treatment well Patient left: in bed;with call bell/phone within reach Nurse Communication: Mobility status      Time: [id number] PT Time Calculation (min) (ACUTE ONLY): 18 min   Charges:   PT Evaluation $PT Eval Low Complexity: [address], DPT, [place] Office: [phone] (Secure chat preferred)   Ander Purpura 08/11/2022, 4:06 PM

## 2022-08-11 NOTE — Progress Notes (Signed)
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 She is scheduled for TED replacement 08/11/22 and at preop evaluatation was found to have serum glucose >600. She endorses polydypsia, polyuria, dry mouth. Other medical problems include COPD/Asthma with last OV 07/22/22, HTN, GERD, hypothyroidism, depression, breast cancer. She was referred by preop to [facility] for evaluation and treatment.  There was concern for diabetic ketoacidosis.  Patient was placed on insulin infusion and hospitalized for further management.  Consultants: ENT [name] consulted on 08/11/2022  Procedures: None    Subjective/Interval History:  Patient in bed, appears comfortable, denies any headache, no fever, no chest pain or pressure, no shortness of breath , no abdominal pain. No focal weakness.   Assessment/Plan:  Diabetic ketoacidosis type II/new onset diabetes mellitus type 2  - Patient with no known previous history of diabetes.  She has new onset DM2.  HbA1c is 13.2.  He is on insulin drip, DKA has resolved has been transition to subcu insulin, since n.p.o. on 08/11/2022 will be less aggressive today.  Diabetic education and insulin education ordered.  CBG (last 3)  Recent Labs    08/10/22 1558 08/10/22 2123 08/11/22 0646  GLUCAP 252* 263* 290*    Abdominal pain Patient was experiencing pain in the right upper quadrant along with suprapubic area.  UA did not suggest infection.  Ultrasound of the right upper quadrant was unremarkable.  A CT scan was done which showed distention of the urinary bladder.  She has been able to void on her own.  Symptoms have resolved.      Acute respiratory failure with hypoxia/history of laryngeal Cancer/status post  laryngectomy Patient had a tracheal esophageal prostheses.  Apparently she expelled this processes back in January after a coughing episode.  Looks like the fistula has closed and the patient has developed increased cough shortness of breath and trouble clearing mucus secretions.  Patient was supposed to undergo replacement of the TE prostheses on 2/26 with [name].  At preop yesterday she was found to have high glucose levels and was sent to the emergency department. Had mucous plugging overnight on 2/23 which resolved after suctioning. Seems to be stable from a respiratory standpoint. Chest x-ray did suggest opacities.  However she is afebrile with normal WBC.  COVID-19 PCR was negative.  Procalcitonin 0.92.  Lactic acid level was normal.  Continue to monitor off of antibiotics. Please stable on trach collar [name] has been informed he will see the patient shortly on 08/11/2022.  N.p.o. for now.  Chronic diastolic CHF EF [id number]  Echocardiogram from 2019 showed normal systolic function of the left ventricle.  Grade 2 diastolic dysfunction was noted. Seems to be fairly euvolemic.  Hypothyroidism Continue levothyroxine.  TSH noted to be severely suppressed at the time of admission, check TSH along with free T4 and T3.  Hypokalemia/hyponatremia Supplemented.  Magnesium is 2.3.   Low sodium levels secondary to pseudohyponatremia along with some hypovolemia as well.  Improved.  COPD Stable.  Continue nebulizer treatments.  GERD Continue PPI  Essential hypertension Monitor blood pressures.  Noted to be on amlodipine and hydrochlorothiazide and losartan along with metoprolol.  Major depressive disorder Continue with home medications.  Obesity Estimated body mass index is 31.79 kg/m as calculated from the following:   Height  as of this encounter: '5\' 7"'$  (1.702 m).   Weight as of this encounter: 92.1 kg.   DVT Prophylaxis: Lovenox Code Status: Full code Family Communication: Husband  bedside on 08/11/2022 Disposition Plan: Hopefully return home when improved  Status is: Inpatient Remains inpatient appropriate because: Diabetic ketoacidosis    Medications: Scheduled:  amLODipine  10 mg Oral Daily   anastrozole  1 mg Oral Daily   Chlorhexidine Gluconate Cloth  6 each Topical Q0600   enoxaparin (LOVENOX) injection  40 mg Subcutaneous Q24H   escitalopram  20 mg Oral Daily   insulin aspart  0-15 Units Subcutaneous Q4H   insulin glargine-yfgn  12 Units Subcutaneous BID   ipratropium  0.5 mg Nebulization BID   levothyroxine  175 mcg Oral Q0600   loratadine  10 mg Oral Daily   losartan  100 mg Oral Daily   metoprolol tartrate  50 mg Oral BID   montelukast  10 mg Oral QHS   mupirocin ointment  1 Application Nasal BID   pantoprazole  80 mg Oral Daily   Continuous:   [id number], alum & mag hydroxide-simeth, benzonatate, budesonide, dextrose, fluticasone, ipratropium-albuterol, ondansetron, sodium chloride HYPERTONIC  Antibiotics: Anti-infectives (From admission, onward)    None       Objective:  Vital Signs  Vitals:   08/11/22 0000 08/11/22 0254 08/11/22 0400 08/11/22 0719  BP: 123/74  120/71 120/71  Pulse: 64 66 (!) 57 62  Resp: '17 18 17 14  '$ Temp: 98 F (36.7 C)     TempSrc: Oral     SpO2: 92% 94% 93% 97%  Weight:      Height:        Intake/Output Summary (Last 24 hours) at 08/11/2022 0811 Last data filed at 08/10/2022 2030 Gross per 24 hour  Intake 597 ml  Output --  Net 597 ml   Filed Weights   08/08/22 1347  Weight: 92.1 kg    Awake Alert, No new F.N deficits, Normal affect Elsa.AT,PERRAL Supple Neck, No JVD, trach site appears clean with trach collar in place Symmetrical Chest wall movement, Good air movement bilaterally, CTAB RRR,No Gallops, Rubs or new Murmurs,  +ve B.Sounds, Abd Soft, No tenderness,   No Cyanosis, Clubbing or edema     Lab Results:  Data Reviewed: I have personally reviewed following labs and reports of  the imaging studies  Recent Labs  Lab 08/08/22 1119 08/08/22 1400 08/08/22 1423 08/10/22 0246  WBC 7.8 6.8  --  5.4  HGB 14.5 14.5 14.6 12.9  HCT 42.9 41.1 43.0 38.1  PLT 364 347  --  257  MCV 85.0 84.2  --  86.8  MCH 28.7 29.7  --  29.4  MCHC 33.8 35.3  --  33.9  RDW 12.2 12.2  --  12.1  LYMPHSABS  --  1.3  --   --   MONOABS  --  0.6  --   --   EOSABS  --  0.1  --   --   BASOSABS  --  0.1  --   --     Recent Labs  Lab 08/08/22 1243 08/08/22 1400 08/08/22 1423 08/08/22 1603 08/08/22 1624 08/08/22 1950 08/08/22 2220 08/09/22 0510 08/10/22 0246 08/11/22 0406  NA  --  124*   < >  --    < > 129* 129* 133* 136 134*  K  --  4.2   < >  --    < > 3.2* 5.5* 3.3* 4.6 4.1  CL  --  81*  --   --    < > 87* 90* 90* 99 98  CO2  --  26  --   --    < > '27 31 31 28 25  '$ ANIONGAP  --  17*  --   --    < > '15 8 12 9 11  '$ GLUCOSE  --  772*  --   --    < > 494* 287* 176* 309* 268*  BUN  --  34*  --   --    < > 34* 26* '22 14 11  '$ CREATININE  --  1.72*  --   --    < > 1.38* 0.97 0.88 0.90 0.81  AST  --   --   --   --   --   --   --   --  79* 95*  ALT  --   --   --   --   --   --   --   --  61* 83*  ALKPHOS  --   --   --   --   --   --   --   --  116 121  BILITOT  --   --   --   --   --   --   --   --  0.7 0.7  ALBUMIN  --   --   --   --   --   --   --   --  3.0* 3.4*  PROCALCITON  --   --   --  0.92  --   --   --   --   --   --   LATICACIDVEN  --  1.8  --  1.7  --   --   --   --   --   --   TSH  --   --   --   --   --  <0.010*  --   --   --   --   HGBA1C 13.2*  --   --   --   --   --   --   --   --   --   MG  --   --   --   --   --   --   --   --  2.3  --   CALCIUM  --  9.3  --   --    < > 9.7 9.5 9.8 9.4 9.8   < > = values in this interval not displayed.     Radiology Studies: ECHOCARDIOGRAM COMPLETE  Result Date: 08/09/2022    ECHOCARDIOGRAM REPORT   Patient Name:   Deborah [name] [facility] Date of Exam: 08/09/2022 Medical Rec #:  [id number]             Height:       67.0 in Accession #:     [id number]            Weight:       203.0 lb Date of Birth:  06-03-1956             BSA:          2.035 m Patient Age:    76 years              BP:           114/91 mmHg Patient Gender: F  HR:           91 bpm. Exam Location:  Inpatient Procedure: 2D Echo, Cardiac Doppler and Color Doppler Indications:    acute diastolic chf  History:        Patient has prior history of Echocardiogram examinations, most                 recent 08/31/2017. COPD; Risk Factors:Hypertension and Former                 Smoker.  Sonographer:    Johny Chess RDCS Referring Phys: [place] Comments: Image acquisition challenging due to patient body habitus. IMPRESSIONS  1. Maximal thickness assessed 2 cm (anterior PSAX imaging). No LVOT Obstruction. Left ventricular ejection fraction, by estimation, is 70 to 75%. The left ventricle has hyperdynamic function. The left ventricle has no regional wall motion abnormalities.  There is severe concentric left ventricular hypertrophy. Left ventricular diastolic parameters are consistent with Grade I diastolic dysfunction (impaired relaxation).  2. Right ventricular systolic function is hyperdynamic. The right ventricular size is normal. There is moderately elevated pulmonary artery systolic pressure. The estimated right ventricular systolic pressure is 0000000 mmHg.  3. A small pericardial effusion is present. The pericardial effusion is posterior to the left ventricle.  4. The mitral valve is grossly normal. No evidence of mitral valve regurgitation. No evidence of mitral stenosis.  5. TR best assesed in parasternal imaging; discrepant finding on apical imaging. Tricuspid valve regurgitation is moderate at least.  6. 2D Vena Contracta 4 mm. Central aortic regurgitation. The aortic valve is tricuspid. Aortic valve regurgitation is moderate. Aortic regurgitation PHT measures 336 msec. Comparison(s): Tricuspid regurgitation may have increased from 2019  (report only). Conclusion(s)/Recommendation(s): If clinically indicated, consider outpatient assessment of hypertrophy ([facility] imaging vs CMR). FINDINGS  Left Ventricle: Maximal thickness assessed 2 cm (anterior PSAX imaging). No LVOT Obstruction. Left ventricular ejection fraction, by estimation, is 70 to 75%. The left ventricle has hyperdynamic function. The left ventricle has no regional wall motion abnormalities. The left ventricular internal cavity size was normal in size. There is severe concentric left ventricular hypertrophy. Left ventricular diastolic parameters are consistent with Grade I diastolic dysfunction (impaired relaxation). Right Ventricle: The right ventricular size is normal. Right vetricular wall thickness was not well visualized. Right ventricular systolic function is hyperdynamic. There is moderately elevated pulmonary artery systolic pressure. The tricuspid regurgitant velocity is 3.27 m/s, and with an assumed right atrial pressure of 3 mmHg, the estimated right ventricular systolic pressure is 0000000 mmHg. Left Atrium: Left atrial size was normal in size. Right Atrium: Right atrial size was normal in size. Pericardium: A small pericardial effusion is present. The pericardial effusion is posterior to the left ventricle. Mitral Valve: The mitral valve is grossly normal. No evidence of mitral valve regurgitation. No evidence of mitral valve stenosis. Tricuspid Valve: TR best assesed in parasternal imaging; discrepant finding on apical imaging. The tricuspid valve is grossly normal. Tricuspid valve regurgitation is moderate . No evidence of tricuspid stenosis. Aortic Valve: 2D Vena Contracta 4 mm. Central aortic regurgitation. The aortic valve is tricuspid. Aortic valve regurgitation is moderate. Aortic regurgitation PHT measures 336 msec. Pulmonic Valve: The pulmonic valve was normal in structure. Pulmonic valve regurgitation is trivial. No evidence of pulmonic stenosis. Aorta: The aortic root and  ascending aorta are structurally normal, with no evidence of dilitation. IAS/Shunts: The interatrial septum was not well visualized.  LEFT VENTRICLE PLAX 2D LVIDd:  3.70 cm   Diastology LVIDs:         2.40 cm   LV e' medial:    5.11 cm/s LV PW:         1.40 cm   LV E/e' medial:  12.1 LV IVS:        1.70 cm   LV e' lateral:   7.18 cm/s LVOT diam:     2.10 cm   LV E/e' lateral: 8.6 LV SV:         69 LV SV Index:   34 LVOT Area:     3.46 cm  RIGHT VENTRICLE RV Basal diam:  2.30 cm RV S prime:     18.60 cm/s TAPSE (M-mode): 2.6 cm LEFT ATRIUM             Index        RIGHT ATRIUM           Index LA diam:        4.00 cm 1.97 cm/m   RA Area:     11.40 cm LA Vol (A2C):   19.3 ml 9.48 ml/m   RA Volume:   23.70 ml  11.65 ml/m LA Vol (A4C):   38.3 ml 18.82 ml/m LA Biplane Vol: 27.8 ml 13.66 ml/m  AORTIC VALVE LVOT Vmax:   123.00 cm/s LVOT Vmean:  80.700 cm/s LVOT VTI:    0.198 m AI PHT:      336 msec  AORTA Ao Root diam: 2.50 cm Ao Asc diam:  3.20 cm MITRAL VALVE                TRICUSPID VALVE MV Area (PHT): 4.49 cm     TR Peak grad:   42.8 mmHg MV Decel Time: 169 msec     TR Vmax:        327.00 cm/s MV E velocity: 61.80 cm/s MV A velocity: 105.00 cm/s  SHUNTS MV E/A ratio:  0.59         Systemic VTI:  0.20 m                             Systemic Diam: 2.10 cm Rudean Haskell MD Electronically signed by Rudean Haskell MD Signature Date/Time: 08/09/2022/1:34:09 PM    Final    CT ABDOMEN PELVIS W CONTRAST  Result Date: 08/09/2022 CLINICAL DATA:  Abdominal pain, hyperglycemia. EXAM: CT ABDOMEN AND PELVIS WITH CONTRAST TECHNIQUE: Multidetector CT imaging of the abdomen and pelvis was performed using the standard protocol following bolus administration of intravenous contrast. RADIATION DOSE REDUCTION: This exam was performed according to the departmental dose-optimization program which includes automated exposure control, adjustment of the mA and/or kV according to patient size and/or use of iterative  reconstruction technique. CONTRAST:  46m OMNIPAQUE IOHEXOL 350 MG/ML SOLN COMPARISON:  CT abdomen dated 11/20/2010. FINDINGS: Lower chest: Segmental airspace collapse within the medial aspects of the LEFT lower lobe, incompletely imaged, also present on chest CT angiogram of 08/29/2021. Hepatobiliary: Liver is diffusely low in density indicating fatty infiltration. No focal mass or lesion is seen within the liver. Gallbladder appears normal. No bile duct dilatation is seen. Pancreas: Unremarkable. No pancreatic ductal dilatation or surrounding inflammatory changes. Spleen: Normal in size without focal abnormality. Adrenals/Urinary Tract: Adrenal glands appear normal. Mild bilateral hydronephrosis. Bladder is moderately distended but otherwise unremarkable. No renal or ureteral calculi. No perinephric inflammation. Stomach/Bowel: No dilated large or small bowel loops. No evidence of focal or  generalized bowel wall inflammation. Appendix appears normal. Scattered diverticulosis of the sigmoid and descending colon but no focal inflammatory changes seen to suggest acute diverticulitis. Stomach is unremarkable. Vascular/Lymphatic: No abdominal aortic aneurysm. No acute-appearing vascular abnormality. IVC appears somewhat decompressed suggesting hypovolemia. No enlarged lymph nodes are identified in the abdomen or pelvis. Reproductive: Probable uterine fibroid at the dome of the fundus. No adnexal mass or free fluid. Other: No free fluid or abscess collection is seen. No free intraperitoneal air. Musculoskeletal: Degenerative spondylosis of the slightly scoliotic thoracolumbar spine, moderate in degree. No acute-appearing osseous abnormality. Superficial soft tissues of the abdomen and pelvis are unremarkable. IMPRESSION: 1. Bladder is moderately distended causing mild bilateral hydronephrosis. Kidneys, ureters and bladder are otherwise unremarkable. No renal or ureteral calculi. 2. IVC appears somewhat decompressed  suggesting hypovolemia. 3. No bowel obstruction or evidence of bowel wall inflammation. No evidence of pyelonephritis. No free fluid or abscess collection. No free intraperitoneal air. 4. Colonic diverticulosis without evidence of acute diverticulitis. 5. Fatty infiltration of the liver. 6. Probable uterine fibroid. Electronically Signed   By: Franki Cabot M.D.   On: 08/09/2022 11:14      LOS: 3 days   Signature  -    Lala Lund M.D on 08/11/2022 at 8:11 AM   -  To page go to [email or web address]

## 2022-08-11 NOTE — H&P (Signed)
Deborah [name] is an 67 y.o. female.   Chief Complaint: dysphagia HPI: S/P total laryngectomy and XRT, years ago, with esophageal stricture.       Past Medical History:  Diagnosis Date   Anemia      h/o of   Anxiety     Arthritis      in knees   Asthma     Breast cancer ([place])     Bronchitis     COPD (chronic obstructive pulmonary disease) ([place])     Diverticulosis 11/15/2012    noted on screening colonoscopy    Dyspnea      with COPD exerbation   Esophageal stricture     Former smoker 03/19/2011   GERD (gastroesophageal reflux disease)     Heart murmur      asymptomatic    History of laryngectomy     History of radiation therapy 11/15/18- 12/06/18    Right Breast total dose 42.56 Gy in 16 fractions.    Hx of radiation therapy 09/03/10 to 10/16/2010    supraglottic larynx   Hypertension     Hypothyroid      due to radiation   Internal hemorrhoid 11/15/2012    small, noted on screening colonoscopy    Larynx cancer ([place]) 07/31/2010    supraglotttic s/p chemo/radiation and surgical rescection.   Leukocytopenia     Nausea alone 07/28/2013   Neck pain 01/21/2012   Normal MRI 07/14/2011    negative for mestasis    Pneumonia 2012   Sciatica     Seizures ([place])      07/24/11 off Effexor w/o seizure   Sepsis ([place]) 08/04/2012   Sinusitis, chronic 07/20/2011    Bilateral maxillary, identified on MRI of head 07/14/11.     Tracheostomy dependent [facility])             Past Surgical History:  Procedure Laterality Date   BREAST LUMPECTOMY Right 07/06/2018   BREAST LUMPECTOMY WITH RADIOACTIVE SEED LOCALIZATION Right 07/06/2018    Procedure: RIGHT BREAST LUMPECTOMY WITH RADIOACTIVE SEED LOCALIZATION;  Surgeon: Rolm Bookbinder, MD;  Location: [place];  Service: General;  Laterality: Right;   COLONOSCOPY N/A 11/15/2012    Procedure: COLONOSCOPY;  Surgeon: Lafayette Dragon, MD;  Location[facility];  Service: Endoscopy;  Laterality: N/A;   CRYOTHERAPY   05/11/2021    Procedure:  CRYOTHERAPY;  Surgeon: Garner Nash, DO;  Location: [place];  Service: Pulmonary;;   DENTAL RESTORATION/EXTRACTION WITH X-RAY       ESOPHAGEAL DILATION N/A 09/09/2019    Procedure: ESOPHAGEAL DILATION;  Surgeon: [name], MD;  Location: [place];  Service: ENT;  Laterality: N/A;   ESOPHAGEAL DILATION N/A 10/05/2019    Procedure: ESOPHAGEAL DILATION;  Surgeon: [name], MD;  Location: [place];  Service: ENT;  Laterality: N/A;  via tracheostomy   ESOPHAGEAL DILATION N/A 02/27/2020    Procedure: ESOPHAGEAL DILATION;  Surgeon: [name], MD;  Location: [place];  Service: ENT;  Laterality: N/A;   ESOPHAGOGASTRODUODENOSCOPY (EGD) WITH PROPOFOL N/A 08/12/2018    Procedure: ESOPHAGOGASTRODUODENOSCOPY (EGD) WITH PROPOFOL;  Surgeon: Doran Stabler, MD;  Location: [place];  Service: Gastroenterology;  Laterality: N/A;   ESOPHAGOSCOPY   06/21/2012    Procedure: ESOPHAGOSCOPY;  Surgeon: [name], MD;  Location: [place];  Service: ENT;  Laterality: N/A;   ESOPHAGOSCOPY N/A 06/24/2021    Procedure: ESOPHAGOSCOPY WITH DILATATION;  Surgeon: [name], MD;  Location: [facility];  Service: ENT;  Laterality: N/A;   ESOPHAGOSCOPY WITH DILITATION N/A 09/21/2014    Procedure: ESOPHAGOSCOPY WITH DILITATION;  Surgeon: [name], MD;  Location: [place];  Service: ENT;  Laterality: N/A;   ESOPHAGOSCOPY WITH DILITATION N/A 07/04/2016    Procedure: ESOPHAGOSCOPY WITH DILITATION;  Surgeon: [name], MD;  Location: [place];  Service: ENT;  Laterality: N/A;   ESOPHAGOSCOPY WITH DILITATION N/A 12/01/2017    Procedure: ESOPHAGOSCOPY WITH DILITATION;  Surgeon: [name], MD;  Location: [place];  Service: ENT;  Laterality: N/A;   ESOPHAGOSCOPY WITH DILITATION N/A 04/19/2018    Procedure: ESOPHAGOSCOPY WITH DILITATION;  Surgeon: [name], MD;  Location: [place];  Service: ENT;  Laterality: N/A;   ESOPHAGOSCOPY WITH  DILITATION N/A 03/07/2019    Procedure: Esophagoscopy with dilatation;  Surgeon: [name], MD;  Location: [place];  Service: ENT;  Laterality: N/A;   ESOPHAGOSCOPY WITH DILITATION N/A 07/16/2020    Procedure: ESOPHAGOSCOPY WITH DILATION;  Surgeon: [name], MD;  Location: [place];  Service: ENT;  Laterality: N/A;   [place] N/A 10/08/2020    Procedure: ESOPHAGOSCOPY With Dilation;  Surgeon: [name], MD;  Location: [place];  Service: ENT;  Laterality: N/A;  [address] [place] to [place]   01/08/2018        FOREIGN BODY REMOVAL   05/11/2021    Procedure: FOREIGN BODY REMOVAL;  Surgeon: Garner Nash, DO;  Location: [place];  Service: Pulmonary;;   FOREIGN BODY REMOVAL BRONCHIAL   10/02/2011    Procedure: REMOVAL FOREIGN BODY BRONCHIAL;  Surgeon: Ruby Cola, MD;  Location: [place];  Service: ENT;  Laterality: N/A;   FOREIGN BODY REMOVAL BRONCHIAL N/A 01/08/2018    Procedure: REMOVAL FOREIGN BODY BRONCHIAL;  Surgeon: Jodi Marble, MD;  [facility];  Service: ENT;  Laterality: N/A;   HEMOSTASIS CONTROL   05/11/2021    Procedure: HEMOSTASIS CONTROL;  Surgeon: Garner Nash, DO;  Location: [place];  Service: Pulmonary;;   LARYNGECTOMY       Porta cath removal       PORTACATH PLACEMENT   09/17/10    Tip in cavoatrial junction   RE-EXCISION OF BREAST CANCER,SUPERIOR MARGINS Right 07/29/2018    Procedure: RE-EXCISION OF RIGHT BREAST MEDIAL MARGINS;  Surgeon: Rolm Bookbinder, MD;  Location: [place];  Service: General;  Laterality: Right;   RIGID ESOPHAGOSCOPY N/A 03/19/2019    Procedure: FLEXIBLE ESOPHAGOSCOPY;  Surgeon: Jodi Marble, MD;  Location: [place];  Service: ENT;  Laterality: N/A;   STOMAPLASTY N/A 10/21/2016    Procedure: Zola Button;  Surgeon: [name], MD;  Location: [place];  Service: ENT;  Laterality: N/A;   TOTAL KNEE ARTHROPLASTY Right 12/12/2019    Procedure: RIGHT  TOTAL KNEE ARTHROPLASTY;  Surgeon: Leandrew Koyanagi, MD;  Location: [place];  Service: Orthopedics;  Laterality: Right;   TOTAL KNEE ARTHROPLASTY Left 04/01/2021    Procedure: LEFT TOTAL KNEE ARTHROPLASTY;  Surgeon: Leandrew Koyanagi, MD;  Location: [place];  Service: Orthopedics;  Laterality: Left;   TRACHEAL DILITATION   07/16/2011    Procedure: TRACHEAL DILITATION;  Surgeon: Beckie Salts, MD;  Location: [place];  Service: ENT;  Laterality: N/A;  dilation of tracheal stoma and replacement of stoma tube   [place] Bilateral 05/11/2021    Procedure: VIDEO BRONCHOSCOPY WITHOUT FLUORO;  Surgeon: Garner Nash, DO;  Location:  [facility];  Service: Pulmonary;  Laterality: Bilateral;  w/ cryotherapy, Foreign body extraction           Family History  Problem Relation Age of Onset   Heart disease Mother     Heart disease Father     Heart disease Sister     Cancer Brother          type unknown   Liver disease Maternal Grandmother     Cancer Sister          type unknown   Diabetes Sister     Breast cancer Neg Hx      Social History:  reports that she quit smoking about 11 years ago. Her smoking use included cigarettes. She has a 40.00 pack-year smoking history. She has never used smokeless tobacco. She reports current alcohol use. She reports that she does not use drugs.   Allergies:      Allergies  Allergen Reactions   Lisinopril Cough      No medications prior to admission.      Lab Results Last 48 Hours  No results found. However, due to the size of the patient record, not all encounters were searched. Please check Results Review for a complete set of results.   Imaging Results (Last 48 hours)  No results found.     ROS: otherwise negative   There were no vitals taken for this visit.   PHYSICAL EXAM: Overall appearance:  Healthy appearing, in no distress Head:  Normocephalic, atraumatic. Ears: External auditory canals are clear; tympanic membranes  are intact and the middle ears are free of any effusion. Nose: External nose is healthy in appearance. Internal nasal exam free of any lesions or obstruction. Oral Cavity/pharynx:  There are no mucosal lesions or masses identified. Neuro:  No identifiable neurologic deficits. Neck: Stoma is clear. No palpable neck masses.   Studies Reviewed: none       Assessment/Plan Proceed with esophagoscopy and dilitation and revision TEP.

## 2022-08-11 NOTE — Transfer of Care (Signed)
Immediate Anesthesia Transfer of Care Note  Patient: Deborah [name]  Procedure(s) Performed: ESOPHAGOSCOPY WITH DILATATION TRACHEAL ESOPHAGEAL PROSTHESIS (TEP) CHANGE  Patient Location: PACU  Anesthesia Type:General  Level of Consciousness: drowsy  Airway & Oxygen Therapy: Patient Spontanous Breathing and Patient connected to tracheostomy mask oxygen  Post-op Assessment: Report given to RN and Post -op Vital signs reviewed and stable  Post vital signs: Reviewed and stable  Last Vitals:  Vitals Value Taken Time  BP 118/63 08/11/22 1115  Temp    Pulse 78 08/11/22 1119  Resp 13 08/11/22 1119  SpO2 90 % 08/11/22 1119  Vitals shown include unvalidated device data.  Last Pain:  Vitals:   08/11/22 0839  TempSrc: Oral  PainSc: 0-No pain         Complications: No notable events documented.

## 2022-08-11 NOTE — Progress Notes (Signed)
OT Cancellation Note  Patient Details Name: Deborah [name] MRN: [id number] DOB: 04-21-1956   Cancelled Treatment:    Reason Eval/Treat Not Completed: Patient at procedure or test/ unavailable  [name] 08/11/2022, 11:12 AM Cleta Alberts, OTR/L [place] Office: [phone]

## 2022-08-11 NOTE — Anesthesia Procedure Notes (Signed)
Date/Time: 08/11/2022 10:43 AM  Performed by: Lieutenant Diego, CRNAPre-anesthesia Checklist: Patient identified, Emergency Drugs available, Suction available, Patient being monitored and Timeout performed Patient Re-evaluated:Patient Re-evaluated prior to induction Oxygen Delivery Method: Circle system utilized Preoxygenation: Pre-oxygenation with 100% oxygen (to stoma) Induction Type: IV induction Ventilation: Unable to mask ventilate Tube size: 6.0 mm Comments: 6.0 ett to stoma, passes easy, equal bbs, pos etco2

## 2022-08-11 NOTE — Anesthesia Postprocedure Evaluation (Signed)
Anesthesia Post Note  Patient: Deborah [name]  Procedure([name]) Performed: ESOPHAGOSCOPY WITH DILATATION TRACHEAL ESOPHAGEAL PROSTHESIS (TEP) CHANGE     Patient location during evaluation: PACU Anesthesia Type: General Level of consciousness: awake and alert Pain management: pain level controlled Vital Signs Assessment: post-procedure vital signs reviewed and stable Respiratory status: spontaneous breathing, nonlabored ventilation, respiratory function stable and patient connected to nasal cannula oxygen Cardiovascular status: blood pressure returned to baseline and stable Postop Assessment: no apparent nausea or vomiting Anesthetic complications: no  No notable events documented.  Last Vitals:  Vitals:   08/11/22 1153 08/11/22 1210  BP: 126/71 114/69  Pulse: 72 73  Resp: 17 14  Temp:  (!) 36.3 C  SpO2: 96%     Last Pain:  Vitals:   08/11/22 1210  TempSrc: Axillary  PainSc:                  Deborah [name]

## 2022-08-11 NOTE — Op Note (Signed)
OPERATIVE REPORT  DATE OF SURGERY: 08/11/2022  PATIENT:  Deborah [name],  67 y.o. female  PRE-OPERATIVE DIAGNOSIS:  Esophageal stricture; History of radiation therapy  POST-OPERATIVE DIAGNOSIS:  Esophageal stricture; History of radiation therapy  PROCEDURE:  Procedure(s): ESOPHAGOSCOPY WITH DILATATION TRACHEAL ESOPHAGEAL PROSTHESIS (TEP) revision  SURGEON:  Beckie Salts, MD  ASSISTANTS: None  ANESTHESIA:   General   EBL: Less than 10 ml  DRAINS: None  LOCAL MEDICATIONS USED:  None  SPECIMEN:  none  COUNTS:  Correct  PROCEDURE DETAILS: The patient was taken to the operating room and placed on the operating table in the supine position. Following induction of general endotracheal anesthesia, via the tracheal stoma, patient was then prepped and draped and turned 90 degrees.  1.  Esophagoscopy with esophageal dilatation.  A rigid cervical esophagoscope was used to evaluate the nasopharynx.  I could not enter into the esophagus due to the stricture.  Maloney dilators were then used starting at 77 working up to [employer] [place] and serially dilating the esophageal stricture.  2.  Revision TEP.  [employer]ox Vega puncture site was used, 17 [place], 10 mm length.  With a rigid esophagoscope in position and the bevel pointing anteriorly exposing the ideal puncture site the introducer needle was inserted externally and identified within the esophagoscope.  The guidewire was then passed and removed through the mouth.  The scope was removed and the dilator and prosthesis were attached on the oral side.  This was then brought back in through the mouth and pharynx and pulled back until the prosthesis was well-seated.  The attaching arms were then cut off.  The prosthesis was felt to be in good position.  This was performed using apnea technique removing the endotracheal tube and replacing as needed.  Patient tolerated the procedures well was awakened extubated and transferred to recovery in stable  condition.    PATIENT DISPOSITION:  To PACU, stable

## 2022-08-12 ENCOUNTER — Encounter (HOSPITAL_COMMUNITY): Payer: Self-pay | Admitting: Otolaryngology

## 2022-08-12 ENCOUNTER — Other Ambulatory Visit (HOSPITAL_COMMUNITY): Payer: Self-pay

## 2022-08-12 DIAGNOSIS — E1110 Type 2 diabetes mellitus with ketoacidosis without coma: Secondary | ICD-10-CM | POA: Diagnosis not present

## 2022-08-12 LAB — CBC WITH DIFFERENTIAL/PLATELET
Abs Immature Granulocytes: 0.01 10*3/uL (ref 0.00–0.07)
Basophils Absolute: 0 10*3/uL (ref 0.0–0.1)
Basophils Relative: 1 %
Eosinophils Absolute: 0.3 10*3/uL (ref 0.0–0.5)
Eosinophils Relative: 5 %
HCT: 38.1 % (ref 36.0–46.0)
Hemoglobin: 12.6 g/dL (ref 12.0–15.0)
Immature Granulocytes: 0 %
Lymphocytes Relative: 34 %
Lymphs Abs: 2 10*3/uL (ref 0.7–4.0)
MCH: 29.3 pg (ref 26.0–34.0)
MCHC: 33.1 g/dL (ref 30.0–36.0)
MCV: 88.6 fL (ref 80.0–100.0)
Monocytes Absolute: 0.7 10*3/uL (ref 0.1–1.0)
Monocytes Relative: 12 %
Neutro Abs: 2.9 10*3/uL (ref 1.7–7.7)
Neutrophils Relative %: 48 %
Platelets: 303 10*3/uL (ref 150–400)
RBC: 4.3 MIL/uL (ref 3.87–5.11)
RDW: 12.1 % (ref 11.5–15.5)
WBC: 5.9 10*3/uL (ref 4.0–10.5)
nRBC: 0 % (ref 0.0–0.2)

## 2022-08-12 LAB — BRAIN NATRIURETIC PEPTIDE: B Natriuretic Peptide: 9.3 pg/mL (ref 0.0–100.0)

## 2022-08-12 LAB — GLUCOSE, CAPILLARY
Glucose-Capillary: 270 mg/dL — ABNORMAL HIGH (ref 70–99)
Glucose-Capillary: 261 mg/dL — ABNORMAL HIGH (ref 70–99)

## 2022-08-12 LAB — COMPREHENSIVE METABOLIC PANEL
ALT: 75 U/L — ABNORMAL HIGH (ref 0–44)
AST: 71 U/L — ABNORMAL HIGH (ref 15–41)
Albumin: 3 g/dL — ABNORMAL LOW (ref 3.5–5.0)
Alkaline Phosphatase: 113 U/L (ref 38–126)
Anion gap: 12 (ref 5–15)
BUN: 14 mg/dL (ref 8–23)
CO2: 24 mmol/L (ref 22–32)
Calcium: 9 mg/dL (ref 8.9–10.3)
Chloride: 96 mmol/L — ABNORMAL LOW (ref 98–111)
Creatinine, Ser: 0.8 mg/dL (ref 0.44–1.00)
GFR, Estimated: >60 mL/min (ref >60)
Glucose, Bld: 233 mg/dL — ABNORMAL HIGH (ref 70–99)
Potassium: 4.1 mmol/L (ref 3.5–5.1)
Sodium: 132 mmol/L — ABNORMAL LOW (ref 135–145)
Total Bilirubin: 0.8 mg/dL (ref 0.3–1.2)
Total Protein: 6.8 g/dL (ref 6.5–8.1)

## 2022-08-12 LAB — T3: T3, Total: 153 ng/dL (ref 71–180)

## 2022-08-12 LAB — MAGNESIUM: Magnesium: 2.1 mg/dL (ref 1.7–2.4)

## 2022-08-12 MED ORDER — INSULIN STARTER KIT- PEN NEEDLES (ENGLISH)
1.0000 | Freq: Once | Status: AC
  Administered 2022-08-12: 1
  Filled 2022-08-12: qty 1

## 2022-08-12 MED ORDER — OXYCODONE HCL 5 MG PO TABS: 5.0000 mg | ORAL_TABLET | Freq: Four times a day (QID) | ORAL | Status: DC | PRN

## 2022-08-12 MED ORDER — ACCU-CHEK GUIDE W/DEVICE KIT
1.0000 | PACK | Freq: Three times a day (TID) | 0 refills | Status: DC
  Filled 2022-08-12: qty 1, 1d supply, fill #0

## 2022-08-12 MED ORDER — ACCU-CHEK SOFTCLIX LANCETS MISC
1.0000 | Freq: Three times a day (TID) | 0 refills | Status: AC
  Filled 2022-08-12: qty 100, 30d supply, fill #0

## 2022-08-12 MED ORDER — INSULIN DETEMIR 100 UNIT/ML FLEXPEN
16.0000 [IU] | PEN_INJECTOR | Freq: Two times a day (BID) | SUBCUTANEOUS | 0 refills | Status: DC
  Filled 2022-08-12: qty 15, 47d supply, fill #0

## 2022-08-12 MED ORDER — INSULIN GLARGINE-YFGN 100 UNIT/ML ~~LOC~~ SOLN
16.0000 [IU] | Freq: Two times a day (BID) | SUBCUTANEOUS | Status: DC
  Administered 2022-08-12: 16 [IU] via SUBCUTANEOUS
  Filled 2022-08-12 (×2): qty 0.16

## 2022-08-12 MED ORDER — LEVOTHYROXINE SODIUM 125 MCG PO TABS
125.0000 ug | ORAL_TABLET | Freq: Every day | ORAL | 0 refills | Status: DC
  Filled 2022-08-12: qty 30, 30d supply, fill #0

## 2022-08-12 MED ORDER — LIVING WELL WITH DIABETES BOOK
Freq: Once | Status: AC
  Filled 2022-08-12: qty 1

## 2022-08-12 MED ORDER — LANCET DEVICE MISC
1.0000 | Freq: Three times a day (TID) | 0 refills | Status: AC
  Filled 2022-08-12 – 2022-09-05 (×3): qty 1, 30d supply, fill #0

## 2022-08-12 MED ORDER — INSULIN PEN NEEDLE 32G X 4 MM MISC
0 refills | Status: DC
  Filled 2022-08-12: qty 100, 25d supply, fill #0

## 2022-08-12 MED ORDER — ACCU-CHEK GUIDE VI STRP
1.0000 | ORAL_STRIP | Freq: Three times a day (TID) | 0 refills | Status: AC
  Filled 2022-08-12: qty 100, 30d supply, fill #0

## 2022-08-12 MED ORDER — INSULIN ASPART 100 UNIT/ML FLEXPEN
PEN_INJECTOR | SUBCUTANEOUS | 0 refills | Status: DC
  Filled 2022-08-12: qty 15, 30d supply, fill #0

## 2022-08-12 MED ORDER — INSULIN ASPART 100 UNIT/ML IJ SOLN
0.0000 [IU] | Freq: Three times a day (TID) | INTRAMUSCULAR | Status: DC
  Administered 2022-08-12: 8 [IU] via SUBCUTANEOUS

## 2022-08-12 NOTE — Progress Notes (Signed)
Patient given discharge instructions and teaching completed. Patient discharging home with family. Meds obtained from pharmacy and diabetic teaching completed by diabetic coordinator.

## 2022-08-12 NOTE — Progress Notes (Signed)
Pt taken down to main entrance for discharge by RN. Pt has taken all of her belongings along with TOC medications.

## 2022-08-12 NOTE — Plan of Care (Signed)
                                      [facility]                            [address] [place], [place] [address]      Deborah [name] was admitted to the Hospital on 08/08/2022 and Discharged  08/12/2022 and should be excused from work/school   for 3  days starting from date -  08/08/2022 , may return to work/school without any restrictions.  Call Lala Lund MD, [facility]  [phone] with questions.  Lala Lund M.D on 08/12/2022,at 9:40 AM  [facility]   Office  [phone]

## 2022-08-12 NOTE — Discharge Summary (Signed)
Deborah [name] [id number] DOB: 18-Mar-1956 DOA: 08/08/2022  PCP: Zola Button, MD  Admit date: 08/08/2022  Discharge date: 08/12/2022  Admitted From: Home   Disposition:  Home   Recommendations for Outpatient Follow-up:   Follow up with PCP in 1-2 weeks  PCP Please obtain BMP/CBC, 2 view CXR in 1week,  (see Discharge instructions)   PCP Please follow up on the following pending results: Please review the CT scan and radiological reports below, patient needs a repeat renal ultrasound in 1 week.  Monitor blood pressure, TSH, CBGs closely.   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 She is scheduled for TED replacement 08/11/22 and at preop evaluatation was found to have serum glucose >600. She endorses polydypsia, polyuria, dry mouth. Other medical problems include COPD/Asthma with last OV 07/22/22, HTN, GERD, hypothyroidism, depression, breast cancer. She was referred by preop to [facility] for evaluation and treatment.  There was concern for diabetic ketoacidosis.  Patient was placed on insulin infusion and hospitalized for further management.                                                                  Hospital Course    Diabetic ketoacidosis type II/new onset diabetes mellitus type 2  - Patient with no known previous history of diabetes.  She has new onset DM2.  HbA1c is 13.2.  She was  initially on insulin drip, DKA has resolved has been transition to subcu insulin, since n.p.o. on 08/11/2022 will be less aggressive today.  Diabetic education and insulin education ordered.  Will be given 1 month supply of insulin along with testing supplies.   CBG (last 3)  Recent Labs    08/11/22 2014 08/11/22 2345 08/12/22 0353  GLUCAP 227* 233* 270*     Abdominal pain Patient was experiencing pain in the right upper quadrant along with suprapubic area.  UA did not suggest infection.  Ultrasound of the right upper quadrant was unremarkable.  A CT scan was done which showed distention of the urinary bladder.  She has been able to void on her own.  Symptoms have resolved.  Request PCP to repeat renal ultrasound in a week for nonspecific changes noted on CT scan.   Acute respiratory failure with hypoxia/history of laryngeal Cancer/status post laryngectomy Patient had a tracheal esophageal prostheses.  Apparently she expelled this processes back  in January after a coughing episode.  Looks like the fistula has closed and the patient has developed increased cough shortness of breath and trouble clearing mucus secretions.  Patient was supposed to undergo replacement of the TE prostheses on 2/26 with [name], this was done this admission.  Is now stable at her baseline and symptom-free, wants to go home will be discharged with PCP and [name] follow-up within a week.   Chronic diastolic CHF EF [id number]  Echocardiogram from 2019 showed normal systolic function of the left ventricle.  Grade 2 diastolic dysfunction was noted. Seems to be fairly euvolemic.   Hypothyroidism Continue levothyroxine.  Severe drug-induced hypothyroidism, hold Synthroid for a week thereafter resume at a lower dose.  PCP to monitor TSH closely.   Hypokalemia/hyponatremia Supplemented.  Magnesium is 2.3.   Low sodium levels secondary to pseudohyponatremia along with some hypovolemia as well.  Improved.   COPD Stable.   Continue nebulizer treatments.   GERD Continue PPI   Essential hypertension Continue home regimen PCP to monitor.   Major depressive disorder Continue with home medications.   Obesity Estimated body mass index is 31.79 kg/m as calculated from the following: Follow-up with PCP for weight loss.     Discharge diagnosis     Principal Problem:   DKA, type 2 ([place]) Active Problems:   Hypothyroidism   Essential hypertension   Major depressive disorder   GERD (gastroesophageal reflux disease)   COPD (chronic obstructive pulmonary disease) (HCC)   Chronic diastolic (congestive) heart failure (HCC)   RUQ abdominal pain    Discharge instructions    Discharge Instructions     Amb Referral to Nutrition and Diabetic Education   Complete by: As directed    Ambulatory Referral for Lung Cancer Scre   Complete by: As directed    Discharge instructions   Complete by: As directed    Follow with Primary MD Zola Button, MD in 7 days, follow-up with your ENT surgeon within a week continue trach site care as instructed by your ENT surgeon.  Get CBC, CMP, 2 view Chest X ray -  checked next visit with your primary MD   Activity: As tolerated with Full fall precautions use walker/cane & assistance as needed  Disposition Home   Diet: Heart Healthy Low Carb   Accuchecks 4 times/day, Once in AM empty stomach and then before each meal. Log in all results and show them to your Prim.MD in 3 days. If any glucose reading is under 80 or above 300 call your Prim MD immidiately. Follow Low glucose instructions for glucose under 80 as instructed.   Special Instructions: If you have smoked or chewed Tobacco  in the last 2 yrs please stop smoking, stop any regular Alcohol  and or any Recreational drug use.  On your next visit with your primary care physician please Get Medicines reviewed and adjusted.  Please request your Prim.MD to go over all Hospital Tests and Procedure/Radiological results  at the follow up, please get all Hospital records sent to your Prim MD by signing hospital release before you go home.  If you experience worsening of your admission symptoms, develop shortness of breath, life threatening emergency, suicidal or homicidal thoughts you must seek medical attention immediately by calling 911 or calling your MD immediately  if symptoms less severe.  You Must read complete instructions/literature along with all the possible adverse reactions/side effects for all the Medicines you take and that have been prescribed to you. Take any  new Medicines after you have completely understood and accpet all the possible adverse reactions/side effects.   Increase activity slowly   Complete by: As directed        Discharge Medications   Allergies as of 08/12/2022       Reactions   Lisinopril Cough        Medication List     STOP taking these medications    budesonide 0.5 MG/2ML nebulizer solution Commonly known as: Pulmicort       TAKE these medications    albuterol (5 MG/ML) 0.5% nebulizer solution Commonly known as: PROVENTIL Take 0.5 mLs (2.5 mg total) by nebulization every 6 (six) hours as needed for wheezing or shortness of breath. Please give the patient 10 64m vials.   amLODipine 10 MG tablet Commonly known as: NORVASC Take 1 tablet (10 mg total) by mouth daily.   anastrozole 1 MG tablet Commonly known as: ARIMIDEX Take 1 tablet by mouth once daily   benzonatate 200 MG capsule Commonly known as: TESSALON Take 1 capsule (200 mg total) by mouth 2 (two) times daily as needed for cough.   Blood Glucose Monitoring Suppl Devi 1 each by Does not apply route in the morning, at noon, and at bedtime. May substitute to any manufacturer covered by patient's insurance.   BLOOD GLUCOSE TEST STRIPS Strp 1 each by In Vitro route in the morning, at noon, and at bedtime. May substitute to any manufacturer covered by patient's insurance.   EQ Allergy Relief  (Cetirizine) 10 MG tablet Generic drug: cetirizine Take 1 tablet by mouth once daily What changed: how much to take   escitalopram 20 MG tablet Commonly known as: Lexapro Take 1 tablet (20 mg total) by mouth daily.   fluticasone 50 MCG/ACT nasal spray Commonly known as: FLONASE Place 1 spray into both nostrils daily. What changed:  when to take this reasons to take this   Humidifiers Misc 1 application by Tracheal Tube route as needed.   insulin aspart 100 UNIT/ML FlexPen Commonly known as: NOVOLOG Before each meal 3 times a day, 140-199 - 2 units, 200-250 - 4 units, 251-299 - 6 units,  300-349 - 8 units,  350 or above 10 units. Insulin PEN if approved, provide syringes and needles if needed.Please switch to any approved short acting Insulin if needed.   insulin detemir 100 UNIT/ML FlexPen Commonly known as: LEVEMIR Inject 16 Units into the skin 2 (two) times daily.   Insulin Syringe-Needle U-100 25G X 1" 1 ML Misc For 4 times a day insulin SQ, 1 month supply. Diagnosis E11.65   ipratropium 0.03 % nasal spray Commonly known as: ATROVENT USE 2 SPRAY(S) IN EACH NOSTRIL EVERY 12 HOURS What changed: See the new instructions.   ipratropium-albuterol 0.5-2.5 (3) MG/3ML Soln Commonly known as: DUONEB Take 3 mLs by nebulization every 6 (six) hours. What changed:  when to take this reasons to take this   Lancet Device Misc 1 each by Does not apply route in the morning, at noon, and at bedtime. May substitute to any manufacturer covered by patient's insurance.   Lancets Misc. Misc 1 each by Does not apply route in the morning, at noon, and at bedtime. May substitute to any manufacturer covered by patient's insurance.   levothyroxine 125 MCG tablet Commonly known as: Euthyrox Take 1 tablet (125 mcg total) by mouth daily before breakfast. Start taking on: August 18, 2022 What changed:  medication strength how much to take These instructions start on August 18, 2022. If you are  unsure what to do until then, ask your doctor or other care provider.   losartan-hydrochlorothiazide 100-25 MG tablet Commonly known as: HYZAAR Take 1 tablet by mouth daily.   metoprolol tartrate 50 MG tablet Commonly known as: LOPRESSOR Take 1 tablet (50 mg total) by mouth 2 (two) times daily.   montelukast 10 MG tablet Commonly known as: SINGULAIR Take 1 tablet (10 mg total) by mouth at bedtime.   nystatin cream Commonly known as: MYCOSTATIN Apply 1 Application topically daily as needed for dry skin.   omeprazole 40 MG capsule Commonly known as: PRILOSEC Take 1 capsule (40 mg total) by mouth 2 (two) times daily.   ondansetron 4 MG tablet Commonly known as: Zofran Take 1 tablet (4 mg total) by mouth every 8 (eight) hours as needed for nausea or vomiting.   sodium chloride HYPERTONIC 3 % nebulizer solution Take by nebulization 3 (three) times daily. What changed:  how much to take when to take this reasons to take this         Follow-up Information     Zola Button, MD. Schedule an appointment as soon as possible for a visit in 1 week(s).   Specialty: Family Medicine Contact information: [place] [address] [phone]         [name], MD. Schedule an appointment as soon as possible for a visit in 1 week(s).   Specialty: Otolaryngology Contact information: [address] [place] [address] [phone]                 Major procedures and Radiology Reports - PLEASE review detailed and final reports thoroughly  -      ECHOCARDIOGRAM COMPLETE  Result Date: 08/09/2022    ECHOCARDIOGRAM REPORT   Patient Name:   MURDIS HOLEMAN [facility] Date of Exam: 08/09/2022 Medical Rec #:  [id number]             Height:       67.0 in Accession #:    [id number]            Weight:       203.0 lb Date of Birth:  01-Jun-1956             BSA:          2.035 m Patient Age:    67 years              BP:           114/91 mmHg Patient  Gender: F                     HR:           91 bpm. Exam Location:  Inpatient Procedure: 2D Echo, Cardiac Doppler and Color Doppler Indications:    acute diastolic chf  History:        Patient has prior history of Echocardiogram examinations, most                 recent 08/31/2017. COPD; Risk Factors:Hypertension and Former                 Smoker.  Sonographer:    Johny Chess RDCS Referring Phys: [place] Comments: Image acquisition challenging due to patient body habitus. IMPRESSIONS  1. Maximal thickness assessed 2 cm (anterior PSAX imaging). No LVOT Obstruction. Left ventricular ejection fraction, by estimation, is 70 to 75%. The left ventricle  has hyperdynamic function. The left ventricle has no regional wall motion abnormalities.  There is severe concentric left ventricular hypertrophy. Left ventricular diastolic parameters are consistent with Grade I diastolic dysfunction (impaired relaxation).  2. Right ventricular systolic function is hyperdynamic. The right ventricular size is normal. There is moderately elevated pulmonary artery systolic pressure. The estimated right ventricular systolic pressure is 0000000 mmHg.  3. A small pericardial effusion is present. The pericardial effusion is posterior to the left ventricle.  4. The mitral valve is grossly normal. No evidence of mitral valve regurgitation. No evidence of mitral stenosis.  5. TR best assesed in parasternal imaging; discrepant finding on apical imaging. Tricuspid valve regurgitation is moderate at least.  6. 2D Vena Contracta 4 mm. Central aortic regurgitation. The aortic valve is tricuspid. Aortic valve regurgitation is moderate. Aortic regurgitation PHT measures 336 msec. Comparison(s): Tricuspid regurgitation may have increased from 2019 (report only). Conclusion(s)/Recommendation(s): If clinically indicated, consider outpatient assessment of hypertrophy ([facility] imaging vs [facility]). FINDINGS  Left Ventricle: Maximal thickness  assessed 2 cm (anterior PSAX imaging). No LVOT Obstruction. Left ventricular ejection fraction, by estimation, is 70 to 75%. The left ventricle has hyperdynamic function. The left ventricle has no regional wall motion abnormalities. The left ventricular internal cavity size was normal in size. There is severe concentric left ventricular hypertrophy. Left ventricular diastolic parameters are consistent with Grade I diastolic dysfunction (impaired relaxation). Right Ventricle: The right ventricular size is normal. Right vetricular wall thickness was not well visualized. Right ventricular systolic function is hyperdynamic. There is moderately elevated pulmonary artery systolic pressure. The tricuspid regurgitant velocity is 3.27 m/s, and with an assumed right atrial pressure of 3 mmHg, the estimated right ventricular systolic pressure is 0000000 mmHg. Left Atrium: Left atrial size was normal in size. Right Atrium: Right atrial size was normal in size. Pericardium: A small pericardial effusion is present. The pericardial effusion is posterior to the left ventricle. Mitral Valve: The mitral valve is grossly normal. No evidence of mitral valve regurgitation. No evidence of mitral valve stenosis. Tricuspid Valve: TR best assesed in parasternal imaging; discrepant finding on apical imaging. The tricuspid valve is grossly normal. Tricuspid valve regurgitation is moderate . No evidence of tricuspid stenosis. Aortic Valve: 2D Vena Contracta 4 mm. Central aortic regurgitation. The aortic valve is tricuspid. Aortic valve regurgitation is moderate. Aortic regurgitation PHT measures 336 msec. Pulmonic Valve: The pulmonic valve was normal in structure. Pulmonic valve regurgitation is trivial. No evidence of pulmonic stenosis. Aorta: The aortic root and ascending aorta are structurally normal, with no evidence of dilitation. IAS/Shunts: The interatrial septum was not well visualized.  LEFT VENTRICLE PLAX 2D LVIDd:         3.70 cm    Diastology LVIDs:         2.40 cm   LV e' medial:    5.11 cm/s LV PW:         1.40 cm   LV E/e' medial:  12.1 LV IVS:        1.70 cm   LV e' lateral:   7.18 cm/s LVOT diam:     2.10 cm   LV E/e' lateral: 8.6 LV SV:         69 LV SV Index:   34 LVOT Area:     3.46 cm  RIGHT VENTRICLE RV Basal diam:  2.30 cm RV S prime:     18.60 cm/s TAPSE (M-mode): 2.6 cm LEFT ATRIUM  Index        RIGHT ATRIUM           Index LA diam:        4.00 cm 1.97 cm/m   RA Area:     11.40 cm LA Vol (A2C):   19.3 ml 9.48 ml/m   RA Volume:   23.70 ml  11.65 ml/m LA Vol (A4C):   38.3 ml 18.82 ml/m LA Biplane Vol: 27.8 ml 13.66 ml/m  AORTIC VALVE LVOT Vmax:   123.00 cm/s LVOT Vmean:  80.700 cm/s LVOT VTI:    0.198 m AI PHT:      336 msec  AORTA Ao Root diam: 2.50 cm Ao Asc diam:  3.20 cm MITRAL VALVE                TRICUSPID VALVE MV Area (PHT): 4.49 cm     TR Peak grad:   42.8 mmHg MV Decel Time: 169 msec     TR Vmax:        327.00 cm/s MV E velocity: 61.80 cm/s MV A velocity: 105.00 cm/s  SHUNTS MV E/A ratio:  0.59         Systemic VTI:  0.20 m                             Systemic Diam: 2.10 cm Rudean Haskell MD Electronically signed by Rudean Haskell MD Signature Date/Time: 08/09/2022/1:34:09 PM    Final    CT ABDOMEN PELVIS W CONTRAST  Result Date: 08/09/2022 CLINICAL DATA:  Abdominal pain, hyperglycemia. EXAM: CT ABDOMEN AND PELVIS WITH CONTRAST TECHNIQUE: Multidetector CT imaging of the abdomen and pelvis was performed using the standard protocol following bolus administration of intravenous contrast. RADIATION DOSE REDUCTION: This exam was performed according to the departmental dose-optimization program which includes automated exposure control, adjustment of the mA and/or kV according to patient size and/or use of iterative reconstruction technique. CONTRAST:  28m OMNIPAQUE IOHEXOL 350 MG/ML SOLN COMPARISON:  CT abdomen dated 11/20/2010. FINDINGS: Lower chest: Segmental airspace collapse within the  medial aspects of the LEFT lower lobe, incompletely imaged, also present on chest CT angiogram of 08/29/2021. Hepatobiliary: Liver is diffusely low in density indicating fatty infiltration. No focal mass or lesion is seen within the liver. Gallbladder appears normal. No bile duct dilatation is seen. Pancreas: Unremarkable. No pancreatic ductal dilatation or surrounding inflammatory changes. Spleen: Normal in size without focal abnormality. Adrenals/Urinary Tract: Adrenal glands appear normal. Mild bilateral hydronephrosis. Bladder is moderately distended but otherwise unremarkable. No renal or ureteral calculi. No perinephric inflammation. Stomach/Bowel: No dilated large or small bowel loops. No evidence of focal or generalized bowel wall inflammation. Appendix appears normal. Scattered diverticulosis of the sigmoid and descending colon but no focal inflammatory changes seen to suggest acute diverticulitis. Stomach is unremarkable. Vascular/Lymphatic: No abdominal aortic aneurysm. No acute-appearing vascular abnormality. IVC appears somewhat decompressed suggesting hypovolemia. No enlarged lymph nodes are identified in the abdomen or pelvis. Reproductive: Probable uterine fibroid at the dome of the fundus. No adnexal mass or free fluid. Other: No free fluid or abscess collection is seen. No free intraperitoneal air. Musculoskeletal: Degenerative spondylosis of the slightly scoliotic thoracolumbar spine, moderate in degree. No acute-appearing osseous abnormality. Superficial soft tissues of the abdomen and pelvis are unremarkable. IMPRESSION: 1. Bladder is moderately distended causing mild bilateral hydronephrosis. Kidneys, ureters and bladder are otherwise unremarkable. No renal or ureteral calculi. 2. IVC appears somewhat decompressed suggesting hypovolemia. 3. No  bowel obstruction or evidence of bowel wall inflammation. No evidence of pyelonephritis. No free fluid or abscess collection. No free intraperitoneal  air. 4. Colonic diverticulosis without evidence of acute diverticulitis. 5. Fatty infiltration of the liver. 6. Probable uterine fibroid. Electronically Signed   By: Franki Cabot M.D.   On: 08/09/2022 11:14   US Abdomen Limited RUQ (LIVER/GB)  Result Date: 08/08/2022 CLINICAL DATA:  Right upper quadrant abdominal pain. EXAM: ULTRASOUND ABDOMEN LIMITED RIGHT UPPER QUADRANT COMPARISON:  None Available. FINDINGS: Gallbladder: No gallstones or wall thickening visualized. No sonographic Murphy sign noted by sonographer. Common bile duct: Diameter: 3 mm, normal Liver: Diffusely increased hepatic parenchymal echotexture suggesting fatty infiltration. No focal lesions are identified. Portal vein is patent on color Doppler imaging with normal direction of blood flow towards the liver. Other: Examination is somewhat technically limited due to patient body habitus and current condition. IMPRESSION: Diffuse fatty infiltration of the liver. No evidence of cholelithiasis or acute cholecystitis. Electronically Signed   By: Lucienne Capers M.D.   On: 08/08/2022 20:14   DG Chest 2 View  Result Date: 08/08/2022 CLINICAL DATA:  SOB EXAM: CHEST - 2 VIEW COMPARISON:  06/06/22 CXR FINDINGS: No pleural effusion. No pneumothorax. Normal cardiac and mediastinal contours. There are new patchy bibasilar opacities, right-greater-than-left. Radiographically apparent displaced rib fractures. Visualized upper is unremarkable. Vertebral body heights are maintained. IMPRESSION: New patchy bibasilar opacities, right-greater-than-left, which could represent aspiration or infection. Electronically Signed   By: Marin Roberts M.D.   On: 08/08/2022 15:11     Subjective    Deborah [name] today has no headache,no chest abdominal pain,no new weakness tingling or numbness, feels much better wants to go home today.    Objective   Blood pressure 123/71, pulse 78, temperature 98.1 F (36.7 C), temperature source Oral, resp. rate 19,  height '5\' 7"'$  (1.702 m), weight 92.1 kg, SpO2 96 %.   Intake/Output Summary (Last 24 hours) at 08/12/2022 0818 Last data filed at 08/12/2022 0300 Gross per 24 hour  Intake 820 ml  Output 700 ml  Net 120 ml    Exam  Awake Alert, No new F.N deficits, Trach site appears stable and clean. [place].AT,PERRAL Supple Neck,   Symmetrical Chest wall movement, Good air movement bilaterally, CTAB RRR,No Gallops,   +ve B.Sounds, Abd Soft, Non tender,  No Cyanosis, Clubbing or edema    Data Review   Recent Labs  Lab 08/08/22 1119 08/08/22 1400 08/08/22 1423 08/10/22 0246 08/12/22 0354  WBC 7.8 6.8  --  5.4 5.9  HGB 14.5 14.5 14.6 12.9 12.6  HCT 42.9 41.1 43.0 38.1 38.1  PLT 364 347  --  257 303  MCV 85.0 84.2  --  86.8 88.6  MCH 28.7 29.7  --  29.4 29.3  MCHC 33.8 35.3  --  33.9 33.1  RDW 12.2 12.2  --  12.1 12.1  LYMPHSABS  --  1.3  --   --  2.0  MONOABS  --  0.6  --   --  0.7  EOSABS  --  0.1  --   --  0.3  BASOSABS  --  0.1  --   --  0.0    Recent Labs  Lab 08/08/22 1243 08/08/22 1400 08/08/22 1423 08/08/22 1603 08/08/22 1624 08/08/22 1950 08/08/22 2220 08/09/22 0510 08/10/22 0246 08/11/22 0406 08/12/22 0354  NA  --  124*   < >  --    < > 129* 129* 133* 136 134* 132*  K  --  4.2   < >  --    < > 3.2* 5.5* 3.3* 4.6 4.1 4.1  CL  --  81*  --   --    < > 87* 90* 90* 99 98 96*  CO2  --  26  --   --    < > '27 31 31 28 25 24  '$ ANIONGAP  --  17*  --   --    < > '15 8 12 9 11 12  '$ GLUCOSE  --  772*  --   --    < > 494* 287* 176* 309* 268* 233*  BUN  --  34*  --   --    < > 34* 26* '22 14 11 14  '$ CREATININE  --  1.72*  --   --    < > 1.38* 0.97 0.88 0.90 0.81 0.80  AST  --   --   --   --   --   --   --   --  79* 95* 71*  ALT  --   --   --   --   --   --   --   --  61* 83* 75*  ALKPHOS  --   --   --   --   --   --   --   --  116 121 113  BILITOT  --   --   --   --   --   --   --   --  0.7 0.7 0.8  ALBUMIN  --   --   --   --   --   --   --   --  3.0* 3.4* 3.0*  PROCALCITON  --   --    --  0.92  --   --   --   --   --   --   --   LATICACIDVEN  --  1.8  --  1.7  --   --   --   --   --   --   --   TSH  --   --   --   --   --  <0.010*  --   --   --  <0.010*  --   HGBA1C 13.2*  --   --   --   --   --   --   --   --   --   --   BNP  --   --   --   --   --   --   --   --   --   --  9.3  MG  --   --   --   --   --   --   --   --  2.3  --  2.1  CALCIUM  --  9.3  --   --    < > 9.7 9.5 9.8 9.4 9.8 9.0   < > = values in this interval not displayed.     Total Time in preparing paper work, data evaluation and todays exam - 35 minutes  Signature  -    Lala Lund M.D on 08/12/2022 at 8:18 AM   -  To page go to [email or web address]

## 2022-08-12 NOTE — Progress Notes (Signed)
Patient  was on 6l 02 via trach. Notified MD about this and that patient is not on 02 at home. Decreased 02 to 3l. Then md instructed to put patient to room air and up in the chair for 30 minutes if patient remains without complaints patient may discharge home per MD.

## 2022-08-12 NOTE — Plan of Care (Signed)
Patient is discharging home today.

## 2022-08-12 NOTE — Inpatient Diabetes Management (Signed)
Inpatient Diabetes Program Recommendations  AACE/ADA: New Consensus Statement on Inpatient Glycemic Control (2015)  Target Ranges:  Prepandial:   less than 140 mg/dL      Peak postprandial:   less than 180 mg/dL (1-2 hours)      Critically ill patients:  140 - 180 mg/dL   Lab Results  Component Value Date   GLUCAP 261 (H) 08/12/2022   HGBA1C 13.2 (H) 08/08/2022    Inpatient Diabetes Program Recommendations:    Educated patient and spouse on insulin pen use at home. Reviewed contents of insulin flexpen starter kit. Reviewed all steps if insulin pen including attachment of needle, 2-unit air shot, dialing up dose, giving injection, removing needle, disposal of sharps, storage of unused insulin, disposal of insulin etc. Patient able to provide successful return demonstration. Also reviewed troubleshooting with insulin pen. MD to give patient Rxs for insulin pens and insulin pen needles.   Spoke with pt and husband about new diagnosis. Discussed A1C results with them and explained what an A1C is, basic pathophysiology of DM Type 2, basic home care, basic diabetes diet nutrition principles, importance of checking CBGs and maintaining good CBG control to prevent long-term and short-term complications. Reviewed signs and symptoms of hyperglycemia and hypoglycemia and how to treat hypoglycemia at home. Also reviewed blood sugar goals at home.  RNs to provide ongoing basic DM education at bedside with this patient. Have ordered educational booklet and insulin starter kit.  Thank you, Nani Gasser. Tabor Bartram, RN, MSN, CDE  Diabetes Coordinator Inpatient Glycemic Control Team Team Pager [phone] (8am-5pm) 08/12/2022 10:01 AM

## 2022-08-12 NOTE — TOC Transition Note (Addendum)
Transition of Care [facility]) - CM/SW Discharge Note   Patient Details  Name: Deborah [name] MRN: [id number] Date of Birth: 09-03-1955  [facility] CM/SW Contact:  Carles Collet, RN Phone Number: 08/12/2022, 8:34 AM   Clinical Narrative:      Medications for DC sent to [facility], will be sent home w patient.  No other TOC needs identified for DC  Coverage- Medicaid Healthy Blue PCP Zola Button, Family Medicine       Patient Goals and CMS Choice      Discharge Placement                         Discharge Plan and Services Additional resources added to the After Visit Summary for                                       Social Determinants of Health (SDOH) Interventions SDOH Screenings   Food Insecurity: No Food Insecurity (08/09/2022)  Housing: Low Risk  (08/09/2022)  Transportation Needs: No Transportation Needs (08/09/2022)  Utilities: Not At Risk (08/09/2022)  Depression (PHQ2-9): Low Risk  (07/03/2022)  Recent Concern: Depression (PHQ2-9) - Medium Risk (06/03/2022)  Tobacco Use: Medium Risk (08/11/2022)     Readmission Risk Interventions     No data to display

## 2022-08-13 NOTE — Progress Notes (Signed)
    SUBJECTIVE:   CHIEF COMPLAINT / HPI:  No chief complaint on file.   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  Managed with insulin infusion during hospitalization, A1c noted to be 13.2.  No prior history of diabetes.  Recommendations for Outpatient Follow-up:    Follow up with PCP in 1-2 weeks   PCP Please obtain BMP/CBC, 2 view CXR in 1week,  (see Discharge instructions)    PCP Please follow up on the following pending results: Please review the CT scan and radiological reports below, patient needs a repeat renal ultrasound in 1 week.  Monitor blood pressure, TSH, CBGs closely.  PERTINENT  PMH / PSH: ***  Patient Care Team: Zola Button, MD as PCP - General (Family Medicine) [name], MD as Attending Physician (Otolaryngology) Kyung Rudd, MD (Radiation Oncology) Heath Lark, MD as Consulting Physician (Hematology and Oncology) Rolm Bookbinder, MD as Consulting Physician (General Surgery) Nicholas Lose, MD as Consulting Physician (Hematology and Oncology) Eppie Gibson, MD as Attending Physician (Radiation Oncology)   OBJECTIVE:   There were no vitals taken for this visit.  Physical Exam      07/03/2022    1:38 PM  Depression screen PHQ 2/9  Decreased Interest 0  Down, Depressed, Hopeless 0  PHQ - 2 Score 0  Altered sleeping 0  Tired, decreased energy 0  Change in appetite 0  Feeling bad or failure about yourself  0  Trouble concentrating 0  Moving slowly or fidgety/restless 0  Suicidal thoughts 0  PHQ-9 Score 0  Difficult doing work/chores Not difficult at all     {Show previous vital signs (optional):23777}  {Labs  Heme  Chem  Endocrine  Serology  Results Review (optional):23779}  ASSESSMENT/PLAN:   No problem-specific Assessment & Plan notes found for this encounter.    No follow-ups on file.   Zola Button, MD [place]

## 2022-08-14 ENCOUNTER — Ambulatory Visit: Payer: Medicaid Other | Admitting: Family Medicine

## 2022-08-14 ENCOUNTER — Encounter: Payer: Self-pay | Admitting: Family Medicine

## 2022-08-14 ENCOUNTER — Ambulatory Visit
Admission: RE | Admit: 2022-08-14 | Discharge: 2022-08-14 | Disposition: A | Discharge status: Home / Self Care | Payer: Medicaid Other | Source: Ambulatory Visit | Attending: Family Medicine | Admitting: Family Medicine

## 2022-08-14 VITALS — BP 122/83 | HR 99 | Ht 67.0 in | Wt 207.8 lb

## 2022-08-14 DIAGNOSIS — R918 Other nonspecific abnormal finding of lung field: Secondary | ICD-10-CM | POA: Diagnosis not present

## 2022-08-14 DIAGNOSIS — R9389 Abnormal findings on diagnostic imaging of other specified body structures: Secondary | ICD-10-CM

## 2022-08-14 DIAGNOSIS — I152 Hypertension secondary to endocrine disorders: Secondary | ICD-10-CM | POA: Diagnosis not present

## 2022-08-14 DIAGNOSIS — N1330 Unspecified hydronephrosis: Secondary | ICD-10-CM

## 2022-08-14 DIAGNOSIS — Z09 Encounter for follow-up examination after completed treatment for conditions other than malignant neoplasm: Secondary | ICD-10-CM | POA: Diagnosis not present

## 2022-08-14 DIAGNOSIS — J9811 Atelectasis: Secondary | ICD-10-CM | POA: Diagnosis not present

## 2022-08-14 DIAGNOSIS — Z23 Encounter for immunization: Secondary | ICD-10-CM | POA: Diagnosis not present

## 2022-08-14 DIAGNOSIS — M47814 Spondylosis without myelopathy or radiculopathy, thoracic region: Secondary | ICD-10-CM | POA: Diagnosis not present

## 2022-08-14 DIAGNOSIS — E039 Hypothyroidism, unspecified: Secondary | ICD-10-CM | POA: Diagnosis not present

## 2022-08-14 DIAGNOSIS — E109 Type 1 diabetes mellitus without complications: Secondary | ICD-10-CM | POA: Diagnosis not present

## 2022-08-14 DIAGNOSIS — E1159 Type 2 diabetes mellitus with other circulatory complications: Secondary | ICD-10-CM

## 2022-08-14 LAB — CULTURE, BLOOD (ROUTINE X 2)
Culture: NO GROWTH
Culture: NO GROWTH
Special Requests: ADEQUATE

## 2022-08-14 MED ORDER — INSULIN GLARGINE 100 UNIT/ML SOLOSTAR PEN: 32.0000 [IU] | PEN_INJECTOR | Freq: Every day | SUBCUTANEOUS | 1 refills | Status: DC

## 2022-08-14 MED ORDER — METFORMIN HCL ER 500 MG PO TB24: 500.0000 mg | ORAL_TABLET | Freq: Every day | ORAL | 0 refills | Status: DC

## 2022-08-14 NOTE — Assessment & Plan Note (Signed)
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  Still hyperglycemic.  Will trial oral medications and continue insulin. - start metformin 500 mg daily - switch Levemir to equivalent dose Lantus 32 units daily - continue Novolog 16 units TIDAC - continue checking fasting glucose and CBGs prior to meals - counseled on signs of hypoglycemia - plan to discuss statin, diabetic eye exams, foot checks/care at follow-up - f/u with pharmacy clinic in 2 weeks, interested in CGM

## 2022-08-14 NOTE — Assessment & Plan Note (Signed)
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  Still hyperglycemic.  Will trial oral medications and continue insulin . - start metformin  500 mg daily - switch Levemir  to equivalent dose Lantus  32 units daily - continue Novolog  16 units TIDAC - continue checking fasting glucose and CBGs prior to meals - counseled on signs of hypoglycemia - plan to discuss statin, diabetic eye exams, foot checks/care at follow-up - f/u with pharmacy clinic in 2 weeks, interested in CGM

## 2022-08-14 NOTE — Patient Instructions (Addendum)
It was nice seeing you today!  Take metformin once a day.  Switch the long acting insulin (Levemir) to Lantus, but take 32 units once a day.  Continue taking Novolog (short acting insulin) 3 times a day with meals. Continue checking your fasting sugars and before each meal.  Schedule a visit in the pharmacy clinic for diabetes in 2 weeks.  Come back in 6 weeks to see me for diabetes and thyroid follow-up.  [facility] Address: [address], [place], [place] [address] Phone: [phone]   Go to your kidney ultrasound as scheduled.  Stay well, Deborah Button, MD [place] [phone]  --  Make sure to check out at the front desk before you leave today.  Please arrive at least 15 minutes prior to your scheduled appointments.  If you had blood work today, I will send you a MyChart message or a letter if results are normal. Otherwise, I will give you a call.  If you had a referral placed, they will call you to set up an appointment. Please give [place] a call if you don't hear back in the next 2 weeks.  If you need additional refills before your next appointment, please call your pharmacy first.

## 2022-08-14 NOTE — Assessment & Plan Note (Signed)
Levothyroxine reduced to 125 mcg during hospitalization.  Plan to recheck TSH in 6 weeks.

## 2022-08-14 NOTE — Assessment & Plan Note (Signed)
Well-controlled on current regimen including ARB

## 2022-08-15 LAB — BASIC METABOLIC PANEL
BUN/Creatinine Ratio: 14 (ref 12–28)
BUN: 13 mg/dL (ref 8–27)
CO2: 21 mmol/L (ref 20–29)
Calcium: 9.9 mg/dL (ref 8.7–10.3)
Chloride: 97 mmol/L (ref 96–106)
Creatinine, Ser: 0.94 mg/dL (ref 0.57–1.00)
Glucose: 261 mg/dL — ABNORMAL HIGH (ref 70–99)
Potassium: 4.5 mmol/L (ref 3.5–5.2)
Sodium: 136 mmol/L (ref 134–144)
eGFR: 68 mL/min/{1.73_m2} (ref >59)

## 2022-08-15 LAB — CBC
Hematocrit: 38.5 % (ref 34.0–46.6)
Hemoglobin: 12.8 g/dL (ref 11.1–15.9)
MCH: 29.4 pg (ref 26.6–33.0)
MCHC: 33.2 g/dL (ref 31.5–35.7)
MCV: 88 fL (ref 79–97)
Platelets: 395 10*3/uL (ref 150–450)
RBC: 4.36 x10E6/uL (ref 3.77–5.28)
RDW: 11.9 % (ref 11.7–15.4)
WBC: 6.9 10*3/uL (ref 3.4–10.8)

## 2022-08-19 ENCOUNTER — Other Ambulatory Visit (HOSPITAL_COMMUNITY): Payer: Medicaid Other

## 2022-08-19 ENCOUNTER — Ambulatory Visit (HOSPITAL_COMMUNITY)
Admission: RE | Admit: 2022-08-19 | Discharge: 2022-08-19 | Disposition: A | Discharge status: Home / Self Care | Payer: Medicaid Other | Source: Ambulatory Visit | Attending: Family Medicine | Admitting: Family Medicine

## 2022-08-19 ENCOUNTER — Telehealth: Payer: Self-pay

## 2022-08-19 DIAGNOSIS — N1330 Unspecified hydronephrosis: Secondary | ICD-10-CM

## 2022-08-19 DIAGNOSIS — Z90710 Acquired absence of both cervix and uterus: Secondary | ICD-10-CM

## 2022-08-19 DIAGNOSIS — N3289 Other specified disorders of bladder: Secondary | ICD-10-CM | POA: Diagnosis not present

## 2022-08-19 DIAGNOSIS — J449 Chronic obstructive pulmonary disease, unspecified: Secondary | ICD-10-CM

## 2022-08-19 NOTE — Telephone Encounter (Signed)
Should be for trach, order was adjusted.  Please let me know if any issues

## 2022-08-19 NOTE — Addendum Note (Signed)
Addended by: Zola Button D on: 08/19/2022 03:19 PM   Modules accepted: Orders

## 2022-08-19 NOTE — Telephone Encounter (Signed)
Order placed

## 2022-08-19 NOTE — Telephone Encounter (Signed)
Patient calls nurse line requesting a DME for a humidifier.   Will forward to PCP to place.

## 2022-08-19 NOTE — Telephone Encounter (Signed)
Please see the below message from [employer].   Leona Carry, RN; Darlina Guys; Minus Liberty; Winger, Leory Plowman Can you clarify this order please, it just stated Humidifier is that for trach? And Please have that added to Rx as well.  Thanks!

## 2022-08-19 NOTE — Telephone Encounter (Signed)
Community message sent to [employer] with update.   Talbot Grumbling, RN

## 2022-08-19 NOTE — Telephone Encounter (Signed)
Community message sent to [employer]. Will await response.   Chaos Carlile C Bobbyjoe Pabst, RN  

## 2022-08-19 NOTE — Addendum Note (Signed)
Addended by: Zola Button D on: 08/19/2022 01:10 PM   Modules accepted: Orders

## 2022-08-20 DIAGNOSIS — C329 Malignant neoplasm of larynx, unspecified: Secondary | ICD-10-CM | POA: Diagnosis not present

## 2022-08-20 DIAGNOSIS — Z930 Tracheostomy status: Secondary | ICD-10-CM | POA: Diagnosis not present

## 2022-08-20 DIAGNOSIS — Z96652 Presence of left artificial knee joint: Secondary | ICD-10-CM | POA: Diagnosis not present

## 2022-08-20 DIAGNOSIS — C321 Malignant neoplasm of supraglottis: Secondary | ICD-10-CM | POA: Diagnosis not present

## 2022-08-20 DIAGNOSIS — M1712 Unilateral primary osteoarthritis, left knee: Secondary | ICD-10-CM | POA: Diagnosis not present

## 2022-08-26 ENCOUNTER — Encounter: Payer: Self-pay | Admitting: Pharmacist

## 2022-08-26 ENCOUNTER — Ambulatory Visit: Payer: Medicaid Other | Admitting: Pharmacist

## 2022-08-26 VITALS — BP 129/80 | HR 73 | Wt 208.8 lb

## 2022-08-26 DIAGNOSIS — E109 Type 1 diabetes mellitus without complications: Secondary | ICD-10-CM

## 2022-08-26 MED ORDER — FREESTYLE LIBRE 3 SENSOR MISC: 11 refills | Status: DC

## 2022-08-26 MED ORDER — INSULIN ASPART 100 UNIT/ML FLEXPEN: PEN_INJECTOR | SUBCUTANEOUS | 0 refills | Status: DC

## 2022-08-26 NOTE — Patient Instructions (Addendum)
It was nice to see you today!   Your goal blood sugar is 80-130 before eating and less than 180 after eating.  Medication Changes: Continue Lantus (insulin glargine) 32 units once daily  Decrease Novolog (insulin aspart) from 16 units to 12 units three times daily  Today we placed a Libre 3 sensor, keep for 14 days before replacing. If you start seeing consistently low readings less than 80, please call Dr. Valentina Lucks to adjust your insulin doses!  Keep up the good work with diet and exercise. Aim for a diet full of vegetables, fruit and lean meats (chicken, [place], fish). Try to limit salt intake by eating fresh or frozen vegetables (instead of canned), rinse canned vegetables prior to cooking and do not add any additional salt to meals.

## 2022-08-26 NOTE — Assessment & Plan Note (Signed)
>>  ASSESSMENT AND PLAN FOR KETOSIS-PRONE DIABETES MELLITUS (HCC) WRITTEN ON 08/26/2022 12:22 PM BY Garfield Coiner G, RPH-CPP  Diabetes, recently diagnosed earlier this year, currently uncontrolled - most recent A1c 13.2% in February 2024.  Blood glucose is much improved with multiple episodes of low blood glucose resulting in symptoms. Patient is able to verbalize appropriate hypoglycemia management plan. Control is suboptimal due to dietary habits and not regularly check blood sugar after meals. - Initiated continuous glucose monitoring with Freestyle Libre 3 device, placed first sensor in office, patient will replace sensors every 14 days - Continued basal insulin  Lantus  (insulin  glargine) 32 units once daily  -Decreased dose of rapid insulin  Novolog  (insulin  aspart) from 16 units to 12 units three times daily before meals -Continued metformin  500 mg XR once daily .  -Patient educated on purpose, proper use, and potential adverse effects -Extensively discussed pathophysiology of diabetes, recommended lifestyle interventions, dietary effects on blood sugar control.  -Counseled on s/sx of and management of hypoglycemia.

## 2022-08-26 NOTE — Progress Notes (Signed)
S:     Chief Complaint  Patient presents with   Medication Management    Diabetes   67 y.o. female who presents for diabetes evaluation, education, and management.  PMH is significant for breast cancer, GERD, hypertension and laryngeal cancer.  Patient was referred and last seen by Primary Care Provider, Dr. Nancy Fetter, on 08/14/22.   At last visit, was recently discharged after being hospitalized for DKA. Initiated Lantus (insulin glargine) 32 units daily and Novolog (insulin aspart) 16 units three times daily with meals.    Today, patient arrives in good spirits and presents without any assistance.   Diabetes was diagnosed in 2024.   Family/Social History: former smoker ([employer] cigarettes) Smoked for ~ 40 years   Current diabetes medications include: Lantus (insulin glargine) 32 units daily, Novolog (insulin aspart) 16 units TID Current hypertension medications include: losartan-HCTZ 100-25 mg once daily, amlodipine 10 mg once daily, metoprolol tartrate 50 mg  BID  Patient reports adherence to taking all medications as prescribed.   Insurance coverage: Medicaid  Patient reports hypoglycemic events. Glucose meter with one reading under 80 mg/dL.   Patient denies nocturia (nighttime urination).  Patient denies neuropathy (nerve pain). Patient reports visual changes.  Patient reported dietary habits: reports eating fried food about once a month Snacks: ice cream Drinks: whole milk, kool-aid, sweet tea  O:   Review of Systems  All other systems reviewed and are negative.   Physical Exam Constitutional:      Appearance: Normal appearance.  Neurological:     Mental Status: She is alert.  Psychiatric:        Mood and Affect: Mood normal.        Behavior: Behavior normal.        Thought Content: Thought content normal.   7 day average blood glucose: 135 mg/dL 14 day average: 159 mg/dL   Lab Results  Component Value Date   HGBA1C 13.2 (H) 08/08/2022   Vitals:    08/26/22 0942  BP: 129/80  Pulse: 73  SpO2: 98%    Lipid Panel     Component Value Date/Time   CHOL 186 08/24/2017 0939   TRIG 120 08/24/2017 0939   HDL 50 08/24/2017 0939   CHOLHDL 3.7 08/24/2017 0939   CHOLHDL 2.9 03/20/2011 0840   VLDL 15 03/20/2011 0840   LDLCALC 112 (H) 08/24/2017 0939   Clinical Atherosclerotic Cardiovascular Disease (ASCVD): No  The ASCVD Risk score (Arnett DK, et al., 2019) failed to calculate for the following reasons:   Cannot find a previous HDL lab   Cannot find a previous total cholesterol lab   Patient is participating in a Managed Medicaid Plan:  Yes   A/P: Diabetes, recently diagnosed earlier this year, currently uncontrolled - most recent A1c 13.2% in February 2024.  Blood glucose is much improved with multiple episodes of low blood glucose resulting in symptoms. Patient is able to verbalize appropriate hypoglycemia management plan. Control is suboptimal due to dietary habits and not regularly check blood sugar after meals. - Initiated continuous glucose monitoring with Freestyle Libre 3 device, placed first sensor in office, patient will replace sensors every 14 days - Continued basal insulin Lantus (insulin glargine) 32 units once daily  -Decreased dose of rapid insulin Novolog (insulin aspart) from 16 units to 12 units three times daily before meals -Continued metformin 500 mg XR once daily .  -Patient educated on purpose, proper use, and potential adverse effects -Extensively discussed pathophysiology of diabetes, recommended lifestyle interventions, dietary  effects on blood sugar control.  -Counseled on s/sx of and management of hypoglycemia.  -Next A1c anticipated 10/2022.   Written patient instructions provided. Patient verbalized understanding of treatment plan.  Total time in face to face counseling 30 minutes.    Follow-up:  Pharmacist in 2 weeks. Patient seen with Louanne Belton PharmD PGY-1 Pharmacy Resident and Estelle June,  PharmD Candidate.

## 2022-08-26 NOTE — Assessment & Plan Note (Signed)
Diabetes, recently diagnosed earlier this year, currently uncontrolled - most recent A1c 13.2% in February 2024.  Blood glucose is much improved with multiple episodes of low blood glucose resulting in symptoms. Patient is able to verbalize appropriate hypoglycemia management plan. Control is suboptimal due to dietary habits and not regularly check blood sugar after meals. - Initiated continuous glucose monitoring with Freestyle Libre 3 device, placed first sensor in office, patient will replace sensors every 14 days - Continued basal insulin Lantus (insulin glargine) 32 units once daily  -Decreased dose of rapid insulin Novolog (insulin aspart) from 16 units to 12 units three times daily before meals -Continued metformin 500 mg XR once daily .  -Patient educated on purpose, proper use, and potential adverse effects -Extensively discussed pathophysiology of diabetes, recommended lifestyle interventions, dietary effects on blood sugar control.  -Counseled on s/sx of and management of hypoglycemia.

## 2022-08-27 NOTE — Progress Notes (Signed)
Reviewed and agree with Dr Koval's plan.   

## 2022-08-28 ENCOUNTER — Ambulatory Visit: Payer: Medicaid Other | Admitting: Family Medicine

## 2022-08-29 ENCOUNTER — Telehealth: Payer: Self-pay

## 2022-08-29 DIAGNOSIS — E109 Type 1 diabetes mellitus without complications: Secondary | ICD-10-CM

## 2022-08-29 MED ORDER — INSULIN GLARGINE 100 UNIT/ML SOLOSTAR PEN: 26.0000 [IU] | PEN_INJECTOR | Freq: Every day | SUBCUTANEOUS | Status: DC

## 2022-08-29 MED ORDER — INSULIN ASPART 100 UNIT/ML FLEXPEN: 8.0000 [IU] | PEN_INJECTOR | Freq: Three times a day (TID) | SUBCUTANEOUS | Status: DC

## 2022-08-29 NOTE — Telephone Encounter (Signed)
Patient contacted RE sensor falling off of skin.   Asked to come to office.  Patient willing to come immediately.   Skin tac and Tegaderm use educated and utilized for application.  Patient verbalized understanding of treatment plan and need to gather supplies.   WE agreed to decrease Lantus (insulin glargine) from 32 to 26 AND  Novolog at from 12 to 8 units.

## 2022-08-29 NOTE — Addendum Note (Signed)
Addended by: Leavy Cella on: 08/29/2022 05:22 PM   Modules accepted: Orders

## 2022-08-29 NOTE — Telephone Encounter (Signed)
Patient calls nurse line requesting to speak with Dr. Valentina Lucks.   She states that [facility] sensor has fallen off and she is unsure what she should do now.   Will forward to Dr. Valentina Lucks.   Patient requesting returned call at [phone].  Talbot Grumbling, RN

## 2022-09-01 NOTE — Telephone Encounter (Signed)
.  tmk

## 2022-09-04 ENCOUNTER — Other Ambulatory Visit: Payer: Self-pay | Admitting: Family Medicine

## 2022-09-04 ENCOUNTER — Other Ambulatory Visit: Payer: Self-pay

## 2022-09-04 DIAGNOSIS — E109 Type 1 diabetes mellitus without complications: Secondary | ICD-10-CM

## 2022-09-04 DIAGNOSIS — E039 Hypothyroidism, unspecified: Secondary | ICD-10-CM

## 2022-09-04 MED ORDER — INSULIN ASPART 100 UNIT/ML FLEXPEN
8.0000 [IU] | PEN_INJECTOR | Freq: Three times a day (TID) | SUBCUTANEOUS | 0 refills | Status: DC
  Filled 2022-09-04: qty 15, fill #0
  Filled 2022-09-05: qty 15, 62d supply, fill #0

## 2022-09-04 MED ORDER — INSULIN GLARGINE 100 UNIT/ML SOLOSTAR PEN
26.0000 [IU] | PEN_INJECTOR | Freq: Every day | SUBCUTANEOUS | 0 refills | Status: DC
  Filled 2022-09-04 – 2022-09-05 (×2): qty 15, 57d supply, fill #0

## 2022-09-04 MED FILL — Levothyroxine Sodium Tab 125 MCG: ORAL | 30 days supply | Qty: 30 | Fill #0 | Status: CN

## 2022-09-05 ENCOUNTER — Other Ambulatory Visit: Payer: Self-pay | Admitting: Hematology and Oncology

## 2022-09-05 ENCOUNTER — Other Ambulatory Visit: Payer: Self-pay

## 2022-09-05 ENCOUNTER — Other Ambulatory Visit (HOSPITAL_COMMUNITY): Payer: Self-pay

## 2022-09-05 DIAGNOSIS — Z9002 Acquired absence of larynx: Secondary | ICD-10-CM | POA: Diagnosis not present

## 2022-09-05 DIAGNOSIS — Z888 Allergy status to other drugs, medicaments and biological substances status: Secondary | ICD-10-CM | POA: Diagnosis not present

## 2022-09-05 DIAGNOSIS — Z79899 Other long term (current) drug therapy: Secondary | ICD-10-CM | POA: Diagnosis not present

## 2022-09-05 DIAGNOSIS — I10 Essential (primary) hypertension: Secondary | ICD-10-CM | POA: Diagnosis not present

## 2022-09-05 DIAGNOSIS — Z8521 Personal history of malignant neoplasm of larynx: Secondary | ICD-10-CM | POA: Diagnosis not present

## 2022-09-05 MED ORDER — ANASTROZOLE 1 MG PO TABS
1.0000 mg | ORAL_TABLET | Freq: Every day | ORAL | 0 refills | Status: DC
  Filled 2022-09-05: qty 30, 30d supply, fill #0

## 2022-09-06 ENCOUNTER — Other Ambulatory Visit (HOSPITAL_COMMUNITY): Payer: Self-pay

## 2022-09-07 ENCOUNTER — Other Ambulatory Visit: Payer: Self-pay | Admitting: Family Medicine

## 2022-09-07 ENCOUNTER — Other Ambulatory Visit: Payer: Self-pay

## 2022-09-07 DIAGNOSIS — E109 Type 1 diabetes mellitus without complications: Secondary | ICD-10-CM

## 2022-09-07 MED FILL — Levothyroxine Sodium Tab 125 MCG: ORAL | 30 days supply | Qty: 30 | Fill #0 | Status: CN

## 2022-09-08 ENCOUNTER — Other Ambulatory Visit: Payer: Self-pay

## 2022-09-08 ENCOUNTER — Telehealth: Payer: Self-pay | Admitting: Hematology and Oncology

## 2022-09-08 DIAGNOSIS — C321 Malignant neoplasm of supraglottis: Secondary | ICD-10-CM | POA: Diagnosis not present

## 2022-09-08 DIAGNOSIS — M1712 Unilateral primary osteoarthritis, left knee: Secondary | ICD-10-CM | POA: Diagnosis not present

## 2022-09-08 DIAGNOSIS — C329 Malignant neoplasm of larynx, unspecified: Secondary | ICD-10-CM | POA: Diagnosis not present

## 2022-09-08 DIAGNOSIS — Z96652 Presence of left artificial knee joint: Secondary | ICD-10-CM | POA: Diagnosis not present

## 2022-09-08 DIAGNOSIS — Z930 Tracheostomy status: Secondary | ICD-10-CM | POA: Diagnosis not present

## 2022-09-08 NOTE — Telephone Encounter (Signed)
Scheduled appointment per IB message. Unable to leave a voicemail due to the mailbox being full.

## 2022-09-09 ENCOUNTER — Ambulatory Visit: Payer: Medicaid Other | Admitting: Pharmacist

## 2022-09-09 VITALS — BP 152/69 | HR 75 | Wt 216.2 lb

## 2022-09-09 DIAGNOSIS — E109 Type 1 diabetes mellitus without complications: Secondary | ICD-10-CM

## 2022-09-09 DIAGNOSIS — E1159 Type 2 diabetes mellitus with other circulatory complications: Secondary | ICD-10-CM | POA: Diagnosis not present

## 2022-09-09 DIAGNOSIS — I152 Hypertension secondary to endocrine disorders: Secondary | ICD-10-CM

## 2022-09-09 MED ORDER — INSULIN ASPART 100 UNIT/ML FLEXPEN: 7.0000 [IU] | PEN_INJECTOR | Freq: Three times a day (TID) | SUBCUTANEOUS | 1 refills | Status: DC

## 2022-09-09 MED ORDER — CARVEDILOL 12.5 MG PO TABS: 12.5000 mg | ORAL_TABLET | Freq: Two times a day (BID) | ORAL | 3 refills | Status: DC

## 2022-09-09 MED ORDER — INSULIN PEN NEEDLE 32G X 4 MM MISC: 0 refills | Status: DC

## 2022-09-09 MED ORDER — INSULIN GLARGINE 100 UNIT/ML SOLOSTAR PEN: 24.0000 [IU] | PEN_INJECTOR | Freq: Every day | SUBCUTANEOUS | 1 refills | Status: DC

## 2022-09-09 NOTE — Patient Instructions (Addendum)
It was great seeing you today!  Here are the following changes we made to your medications:  DECREASE Lantus (insulin glargine) to 24 units daily  DECREASE Novolog (Insulin aspart) to 7 units 3 times daily with meals  STOP Metoprolol 50 mg 2 times daily  START Carvedilol 12.5 mg 2 times daily with meals

## 2022-09-09 NOTE — Assessment & Plan Note (Signed)
Diabetes diagnosed in 2024. Patient is able to verbalize appropriate hypoglycemia management plan. Medication adherence appears good. Control is optimal with 97% of CGM readings in target range. Several low readings are concerning, which will necessitate lowering insulin dosing. -Decreased dose of basal insulin Lantus (insulin glargine) from 26 units to 24 units daily in the morning.  -Decreased dose of rapid insulin Novolog (insulin aspart) from 8 units to 7 units 3 times daily with meals.  -Continued metformin 500 mg daily.  -Extensively discussed pathophysiology of diabetes, recommended lifestyle interventions, dietary effects on blood sugar control.  -Counseled on s/sx of and management of hypoglycemia.  -Next A1c anticipated 11/06/22.

## 2022-09-09 NOTE — Assessment & Plan Note (Signed)
>>  ASSESSMENT AND PLAN FOR KETOSIS-PRONE DIABETES MELLITUS (HCC) WRITTEN ON 09/09/2022 11:29 AM BY Niylah Hassan G, RPH-CPP  Diabetes diagnosed in 2024. Patient is able to verbalize appropriate hypoglycemia management plan. Medication adherence appears good. Control is optimal with 97% of CGM readings in target range. Several low readings are concerning, which will necessitate lowering insulin  dosing. -Decreased dose of basal insulin  Lantus  (insulin  glargine) from 26 units to 24 units daily in the morning.  -Decreased dose of rapid insulin  Novolog  (insulin  aspart) from 8 units to 7 units 3 times daily with meals.  -Continued metformin  500 mg daily.  -Extensively discussed pathophysiology of diabetes, recommended lifestyle interventions, dietary effects on blood sugar control.  -Counseled on s/sx of and management of hypoglycemia.  -Next A1c anticipated 11/06/22.

## 2022-09-09 NOTE — Assessment & Plan Note (Signed)
Hypertension diagnosed in 2012. Blood pressure goal of <130/80 mmHg. Medication adherence is good. Blood pressure control is suboptimal due to elevated in-office BP readings. -Discontinued metoprolol Tartrate 50 mg BID. -Start carvedilol 12.5 mg BID with meals -Patient educated on adverse effects of carvedilol (dizziness, low heart rate)

## 2022-09-09 NOTE — Progress Notes (Signed)
S:     Chief Complaint  Patient presents with   Medication Management    T2DM F/U   67 y.o. female who presents for diabetes evaluation, education, and management.  PMH is significant for T2DM, HTN.  Patient was referred and last seen by Primary Care Provider, Dr. Nancy Fetter, on 08/14/2022.  At last visit, Libre 3 sensor was placed on the arm and Novolo (insulin aspart) was decreased from 16 to 12 units daily.   Today, patient arrives in good spirits and presents without any assistance.   Patient reports Diabetes was diagnosed in 2024.   Current diabetes medications include: Lantus (insulin glargine) 26 units daily, Novolog (insulin aspart) 8 units 3 times daily Current hypertension medications include: Amlodipine 10 mg, Losartan-HCTZ 100-25 mg, Metoprolol 50 mg BID  Patient reports adherence to taking all medications as prescribed.   Do you feel that your medications are working for you? yes Have you been experiencing any side effects to the medications prescribed? no Do you have any problems obtaining medications due to transportation or finances? no Insurance coverage: [place] [employer]  Patient denies hypoglycemic events. Reported some lows but did not have hypoglycemic symptoms, drank orange juice to resolve them.  Patient reports nocturia (nighttime urination). 1 time during the night Patient denies neuropathy (nerve pain). Patient reports visual changes that have improved since last visit with Pharmacist. Patient denies self foot exams.   Patient reported dietary habits: Eats 2 meals/day   Within the past 12 months, did you worry whether your food would run out before you got money to buy more? no Within the past 12 months, did the food you bought run out, and you didn't have money to get more? no   O:   Review of Systems  All other systems reviewed and are negative.   Physical Exam Constitutional:      Appearance: Normal appearance.  Neurological:     Mental Status:  She is alert.  Psychiatric:        Mood and Affect: Mood normal.        Behavior: Behavior normal.        Thought Content: Thought content normal.        Judgment: Judgment normal.     CGM Download:  % Time CGM is active: 91% Average Glucose: 117 mg/dL Glucose Management Indicator: 6.1  Glucose Variability: 21.3 (goal <36%) Time in Goal:  - Time in range 70-180: 97% - Time above range: 2% High (181-250 mg/dL) - Time below range: 1% Low (54-69 mg/dL)   Lab Results  Component Value Date   HGBA1C 13.2 (H) 08/08/2022   Vitals:   09/09/22 0933 09/09/22 0943  BP: (!) 157/78 (!) 152/69  Pulse: 75   SpO2: 95%     Lipid Panel     Component Value Date/Time   CHOL 186 08/24/2017 0939   TRIG 120 08/24/2017 0939   HDL 50 08/24/2017 0939   CHOLHDL 3.7 08/24/2017 0939   CHOLHDL 2.9 03/20/2011 0840   VLDL 15 03/20/2011 0840   LDLCALC 112 (H) 08/24/2017 0939    Clinical Atherosclerotic Cardiovascular Disease (ASCVD): Yes   Patient is participating in a Managed Medicaid Plan:  Yes   A/P: Diabetes diagnosed in 2024. Patient is able to verbalize appropriate hypoglycemia management plan. Medication adherence appears good. Control is optimal with 97% of CGM readings in target range. Several low readings are concerning, which will necessitate lowering insulin dosing. -Decreased dose of basal insulin Lantus (insulin glargine) from  26 units to 24 units daily in the morning.  -Decreased dose of rapid insulin Novolog (insulin aspart) from 8 units to 7 units 3 times daily with meals.  -Continued metformin 500 mg daily.  -Extensively discussed pathophysiology of diabetes, recommended lifestyle interventions, dietary effects on blood sugar control.  -Counseled on s/sx of and management of hypoglycemia.  -Next A1c anticipated 11/06/22.   Hypertension diagnosed in 2012. Blood pressure goal of <130/80 mmHg. Medication adherence is good. Blood pressure control is suboptimal due to elevated  in-office BP readings. -Discontinued metoprolol Tartrate 50 mg BID. -Start carvedilol 12.5 mg BID with meals -Patient educated on adverse effects of carvedilol (dizziness, low heart rate)  Written patient instructions provided. Patient verbalized understanding of treatment plan.  Total time in face to face counseling 30 minutes.    Follow-up:  Pharmacist 10/07/22. PCP clinic visit in 10/03/22.  Patient seen with Estelle June, PharmD Candidate.

## 2022-09-10 NOTE — Progress Notes (Signed)
Reviewed and agree with Dr Koval's plan.   

## 2022-09-12 ENCOUNTER — Telehealth: Payer: Self-pay

## 2022-09-12 NOTE — Telephone Encounter (Signed)
A Prior Authorization was initiated for this patients FREESTYLE LIBRE 3 SENSOR through CoverMyMeds.   Key: [id number]

## 2022-09-15 ENCOUNTER — Other Ambulatory Visit: Payer: Self-pay | Admitting: Family Medicine

## 2022-09-17 ENCOUNTER — Other Ambulatory Visit (HOSPITAL_COMMUNITY): Payer: Self-pay

## 2022-09-17 NOTE — Telephone Encounter (Signed)
Pharmacy Patient Advocate Encounter  Prior Authorization for [employer] Sensor has been approved by [employer]ins).    PA # [id number] Effective dates: 09/12/2022 through 03/11/2023  Spoke with Pharmacy to process.

## 2022-09-19 DIAGNOSIS — T85638A Leakage of other specified internal prosthetic devices, implants and grafts, initial encounter: Secondary | ICD-10-CM | POA: Diagnosis not present

## 2022-09-19 DIAGNOSIS — Z9002 Acquired absence of larynx: Secondary | ICD-10-CM | POA: Diagnosis not present

## 2022-09-19 DIAGNOSIS — Z8521 Personal history of malignant neoplasm of larynx: Secondary | ICD-10-CM | POA: Diagnosis not present

## 2022-09-20 DIAGNOSIS — M1712 Unilateral primary osteoarthritis, left knee: Secondary | ICD-10-CM | POA: Diagnosis not present

## 2022-09-20 DIAGNOSIS — Z96652 Presence of left artificial knee joint: Secondary | ICD-10-CM | POA: Diagnosis not present

## 2022-09-20 DIAGNOSIS — C329 Malignant neoplasm of larynx, unspecified: Secondary | ICD-10-CM | POA: Diagnosis not present

## 2022-09-20 DIAGNOSIS — C321 Malignant neoplasm of supraglottis: Secondary | ICD-10-CM | POA: Diagnosis not present

## 2022-09-20 DIAGNOSIS — Z930 Tracheostomy status: Secondary | ICD-10-CM | POA: Diagnosis not present

## 2022-09-23 ENCOUNTER — Other Ambulatory Visit: Payer: Self-pay

## 2022-09-25 ENCOUNTER — Encounter: Payer: Self-pay | Admitting: Dietician

## 2022-09-25 ENCOUNTER — Encounter: Payer: Medicaid Other | Attending: Internal Medicine | Admitting: Dietician

## 2022-09-25 ENCOUNTER — Other Ambulatory Visit: Payer: Self-pay | Admitting: Family Medicine

## 2022-09-25 VITALS — Ht 67.0 in | Wt 216.0 lb

## 2022-09-25 DIAGNOSIS — C329 Malignant neoplasm of larynx, unspecified: Secondary | ICD-10-CM | POA: Diagnosis not present

## 2022-09-25 DIAGNOSIS — C321 Malignant neoplasm of supraglottis: Secondary | ICD-10-CM | POA: Diagnosis not present

## 2022-09-25 DIAGNOSIS — Z96652 Presence of left artificial knee joint: Secondary | ICD-10-CM | POA: Diagnosis not present

## 2022-09-25 DIAGNOSIS — Z930 Tracheostomy status: Secondary | ICD-10-CM | POA: Diagnosis not present

## 2022-09-25 DIAGNOSIS — M1712 Unilateral primary osteoarthritis, left knee: Secondary | ICD-10-CM | POA: Diagnosis not present

## 2022-09-25 DIAGNOSIS — E119 Type 2 diabetes mellitus without complications: Secondary | ICD-10-CM | POA: Diagnosis not present

## 2022-09-25 NOTE — Patient Instructions (Signed)
Keep up the good work!  Please call for questions.

## 2022-09-25 NOTE — Progress Notes (Signed)
Diabetes Self-Management Education  Visit Type: First/Initial  Appt. Start Time: 1600 Appt. End Time: 1700  09/25/2022  [name], identified by name and date of birth, is a 67 y.o. female with a diagnosis of Diabetes: Type 2.   ASSESSMENT Patient is here today alone.  Newly diagnosed diabetes. She has been reseaching about diabetes treatment and has been doing very well!  History includes:  Type 2 Diabetes (07/2022), Lung cancer 15 years ago, trach, esophagus gets stretched per patient every 3 months, dysphagia per chart and patient reports no special diet, HTN, COPD, hypothyroidism, GERD Labs noted to include:  A1C 13.2% 08/08/2022, eGFR 68 08/14/22 Medications include:  Lantus 24 units q am, Novolog 8 units before each meal, metformin CGM:  Libre 3  CGM Results from download: 09/25/2022  % Time CGM active:   96 %   (Goal >70%)  Average glucose:   116 mg/dL for 14 days  Glucose management indicator:   6.1 %  Time in range (70-180 mg/dL):   98 %   (Goal >50%)  Time High (181-250 mg/dL):   0 %   (Goal < 35%)  Time Very High (>250 mg/dL):    0 %   (Goal < 5%)  Time Low (54-69 mg/dL):   2 %   (Goal <4%)  Time Very Low (<54 mg/dL):   0 %   (Goal <6%)   Weight hx: 67" 216 lbs this week per patient. She is currently at her preferred goal weight.  Patient lives with her husband.  She does the shopping and cooking.  She is retired.  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 Dislikes bread and eats only 1 x per week, dislikes low fat milk so decreased her portion size of whole milk.  Height 5\' 7"  (1.702 m), weight 216 lb (98 kg). Body mass index is 33.83 kg/m.   Diabetes Self-Management Education - 09/25/22 1640       Visit Information   Visit Type First/Initial      Initial Visit   Diabetes Type Type 2    Date Diagnosed 07/2022    Are you currently following a meal plan? Yes    What type of meal plan do you follow?  diabetic    Are you taking your medications as prescribed? Yes      Health Coping   How would you rate your overall health? Good      Psychosocial Assessment   Patient Belief/Attitude about Diabetes Motivated to manage diabetes    What is the hardest part about your diabetes right now, causing you the most concern, or is the most worrisome to you about your diabetes?   Other (comment)   none   Self-care barriers None    Self-management support Doctor's office    Other persons present Patient    Patient Concerns Nutrition/Meal planning;Healthy Lifestyle    Special Needs None    Preferred Learning Style No preference indicated    Learning Readiness Ready    How often do you need to have someone help you when you read instructions, pamphlets, or other written materials from your doctor or pharmacy? 1 - Never    What is the last grade level you completed in school? 12      Pre-Education Assessment   Patient understands the diabetes disease and treatment process. Needs Instruction    Patient understands incorporating nutritional management into lifestyle. Needs Instruction  Patient undertands incorporating physical activity into lifestyle. Needs Instruction    Patient understands using medications safely. Needs Instruction    Patient understands monitoring blood glucose, interpreting and using results Needs Instruction    Patient understands prevention, detection, and treatment of acute complications. Needs Instruction    Patient understands prevention, detection, and treatment of chronic complications. Needs Instruction    Patient understands how to develop strategies to address psychosocial issues. Needs Instruction    Patient understands how to develop strategies to promote health/change behavior. Needs Instruction      Complications   Last HgB A1C per patient/outside source 13.2 %   07/2022   How often do you check your blood sugar? > 4 times/day    Fasting Blood glucose range  (mg/dL) 16-10970-129    Postprandial Blood glucose range (mg/dL) 604-540;[phone];70-129    Number of hypoglycemic episodes per month 2    Can you tell when your blood sugar is low? Yes    What do you do if your blood sugar is low? drinks OJ    Number of hyperglycemic episodes ( >200mg /dL): Never    Can you tell when your blood sugar is high? No    Have you had a dilated eye exam in the past 12 months? No    Have you had a dental exam in the past 12 months? No    Are you checking your feet? Yes    How many days per week are you checking your feet? 7      Dietary Intake   Breakfast eggs, coffee with splenda    Snack (morning) none    Lunch chicken, salad with 1000 island    Snack (afternoon) crackers/graham crackers, milk, nuts    Dinner 1/2 sub sandwich, salad OR meatloaf, turnip greens, mac and cheese    Snack (evening) spoon of PB, graham crackers, milk    Beverage(s) water, whole milk, coffee with splenda, zero soda, OJ to treat a low      Activity / Exercise   Activity / Exercise Type Light (walking / raking leaves)    How many days per week do you exercise? 7    How many minutes per day do you exercise? 15    Total minutes per week of exercise 105      Patient Education   Previous Diabetes Education No    Disease Pathophysiology Definition of diabetes, type 1 and 2, and the diagnosis of diabetes    Healthy Eating Role of diet in the treatment of diabetes and the relationship between the three main macronutrients and blood glucose level;Food label reading, portion sizes and measuring food.;Plate Method;Meal options for control of blood glucose level and chronic complications.;Meal timing in regards to the patients' current diabetes medication.;Reviewed blood glucose goals for pre and post meals and how to evaluate the patients' food intake on their blood glucose level.    Being Active Role of exercise on diabetes management, blood pressure control and cardiac health.    Medications  Taught/reviewed insulin/injectables, injection, site rotation, insulin/injectables storage and needle disposal.;Reviewed patients medication for diabetes, action, purpose, timing of dose and side effects.    Monitoring Identified appropriate SMBG and/or A1C goals.;Taught/evaluated CGM (comment);Daily foot exams;Yearly dilated eye exam    Acute complications Discussed and identified patients' prevention, symptoms, and treatment of hyperglycemia.;Taught prevention, symptoms, and  treatment of hypoglycemia - the 15 rule.    Chronic complications Relationship between chronic complications and blood glucose control    Diabetes Stress and  Support Identified and addressed patients feelings and concerns about diabetes;Role of stress on diabetes      Individualized Goals (developed by patient)   Nutrition General guidelines for healthy choices and portions discussed    Physical Activity Exercise 5-7 days per week;15 minutes per day;30 minutes per day    Medications take my medication as prescribed    Monitoring  Consistenly use CGM    Reducing Risk treat hypoglycemia with 15 grams of carbs if blood glucose less than 70mg /dL;do foot checks daily;examine blood glucose patterns      Post-Education Assessment   Patient understands the diabetes disease and treatment process. Demonstrates understanding / competency    Patient understands incorporating nutritional management into lifestyle. Demonstrates understanding / competency    Patient undertands incorporating physical activity into lifestyle. Demonstrates understanding / competency    Patient understands using medications safely. Demonstrates understanding / competency    Patient understands monitoring blood glucose, interpreting and using results Demonstrates understanding / competency    Patient understands prevention, detection, and treatment of acute complications. Demonstrates understanding / competency    Patient understands prevention, detection,  and treatment of chronic complications. Demonstrates understanding / competency    Patient understands how to develop strategies to address psychosocial issues. Demonstrates understanding / competency    Patient understands how to develop strategies to promote health/change behavior. Demonstrates understanding / competency      Outcomes   Future DMSE Yearly;PRN    Program Status Completed             Individualized Plan for Diabetes Self-Management Training:   Learning Objective:  Patient will have a greater understanding of diabetes self-management. Patient education plan is to attend individual and/or group sessions per assessed needs and concerns.   Plan:   Patient Instructions  Keep up the good work!  Please call for questions.  Expected Outcomes:     Education material provided: ADA - How to Thrive: A Guide for Your Journey with Diabetes, Food label handouts, Meal plan card, My Plate, Snack sheet, and Diabetes Resources  If problems or questions, patient to contact team via:  Phone  Future DSME appointment: Yearly, PRN

## 2022-09-27 ENCOUNTER — Other Ambulatory Visit (HOSPITAL_COMMUNITY): Payer: Self-pay

## 2022-09-30 ENCOUNTER — Other Ambulatory Visit: Payer: Self-pay | Admitting: Family Medicine

## 2022-10-03 ENCOUNTER — Ambulatory Visit: Payer: Medicaid Other | Admitting: Family Medicine

## 2022-10-06 DIAGNOSIS — Z48813 Encounter for surgical aftercare following surgery on the respiratory system: Secondary | ICD-10-CM | POA: Diagnosis not present

## 2022-10-06 DIAGNOSIS — Z8521 Personal history of malignant neoplasm of larynx: Secondary | ICD-10-CM | POA: Diagnosis not present

## 2022-10-07 ENCOUNTER — Ambulatory Visit: Payer: Medicaid Other | Admitting: Pharmacist

## 2022-10-09 ENCOUNTER — Encounter: Payer: Self-pay | Admitting: Pharmacist

## 2022-10-09 ENCOUNTER — Ambulatory Visit: Payer: Medicaid Other | Admitting: Pharmacist

## 2022-10-09 VITALS — BP 137/86 | HR 87 | Wt 223.0 lb

## 2022-10-09 DIAGNOSIS — E109 Type 1 diabetes mellitus without complications: Secondary | ICD-10-CM

## 2022-10-09 DIAGNOSIS — I152 Hypertension secondary to endocrine disorders: Secondary | ICD-10-CM

## 2022-10-09 DIAGNOSIS — E1159 Type 2 diabetes mellitus with other circulatory complications: Secondary | ICD-10-CM

## 2022-10-09 MED ORDER — INSULIN ASPART 100 UNIT/ML FLEXPEN: 8.0000 [IU] | PEN_INJECTOR | Freq: Three times a day (TID) | SUBCUTANEOUS | 1 refills | Status: DC

## 2022-10-09 NOTE — Assessment & Plan Note (Signed)
Diabetes diagnosed in 2024. Patient is able to verbalize appropriate hypoglycemia management plan. Medication adherence appears good. Control is optimal with 98% of CGM readings in target range. Low readings were related to compression while sleeping and are not concerning.  -Continued basal insulin Lantus (insulin glargine) 24 units daily in the morning.  -Continued rapid insulin Novolog (insulin aspart) from 8 units 3 times daily with meals.  -Continued metformin 500 mg daily.  -Extensively discussed pathophysiology of diabetes, recommended lifestyle interventions, dietary effects on blood sugar control.  -Counseled on s/sx of and management of hypoglycemia.

## 2022-10-09 NOTE — Patient Instructions (Addendum)
It was nice to see you today!  Your goal blood sugar is 80-130 before eating and less than 180 after eating.  No Medication Changes. GREAT JOB ON THE PROGRESS YOU'VE MADE!  Monitor blood sugars at home and keep a log (glucometer or piece of paper) to bring with you to your next visit.  Keep up the good work with diet and exercise. Aim for a diet full of vegetables, fruit and lean meats (chicken, [place], fish). Try to limit salt intake by eating fresh or frozen vegetables (instead of canned), rinse canned vegetables prior to cooking and do not add any additional salt to meals.

## 2022-10-09 NOTE — Assessment & Plan Note (Signed)
>>  ASSESSMENT AND PLAN FOR KETOSIS-PRONE DIABETES MELLITUS ([facility]) WRITTEN ON 10/09/2022  4:22 PM BY Nichoals Heyde G, RPH-CPP  Diabetes diagnosed in 2024. Patient is able to verbalize appropriate hypoglycemia management plan. Medication adherence appears good. Control is optimal with 98% of CGM readings in target range. Low readings were related to compression while sleeping and are not concerning.  -Continued basal insulin  Lantus  (insulin  glargine) 24 units daily in the morning.  -Continued rapid insulin  Novolog  (insulin  aspart) from 8 units 3 times daily with meals.  -Continued metformin  500 mg daily.  -Extensively discussed pathophysiology of diabetes, recommended lifestyle interventions, dietary effects on blood sugar control.  -Counseled on s/sx of and management of hypoglycemia.

## 2022-10-09 NOTE — Progress Notes (Signed)
S:    Chief Complaint  Patient presents with   Medication Management    T2DM   67 y.o. female who presents for diabetes evaluation, education, and management.  PMH is significant for T2DM, HTN.  Patient was referred and last seen by Primary Care Provider, Dr. Wynelle Link, on 08/14/2022. Last seen by pharmacy clinic on 09/09/22 where Lantus (insulin glargine) was reduced from 26 units to 24 units and Novolog (insulin aspart) was reduced from 8 units to 7 units 3 times daily with meals due to hypoglycemia. Carvedilol 12.5 mg BID was started and metoprolol tartrate 50 mg BID was discontinued.   Today, patient arrives in good spirits and presents without any assistance.   Patient reports Diabetes was diagnosed in 2024.   Current diabetes medications include: Lantus (insulin glargine) 26 units daily, Novolog (insulin aspart) 8 units 3 times daily Current hypertension medications include: amlodipine 10 mg, losartan-HCTZ 100-25 mg, carvedilol 12.5 mg BID  Patient reports adherence to taking all medications as prescribed.   Insurance coverage: [place] Medicaid  Patient denies hypoglycemic events. Had some compression lows on CGM.  Patient reports nocturia (nighttime urination). 1 time during the night Patient denies neuropathy (nerve pain). Patient reports visual changes that have improved since last visit with Pharmacist. Patient denies self foot exams.   Patient reported dietary habits: Eats 2 meals/day   O:   Review of Systems  All other systems reviewed and are negative.   Physical Exam Constitutional:      Appearance: Normal appearance.  Neurological:     Mental Status: She is alert.  Psychiatric:        Mood and Affect: Mood normal.        Behavior: Behavior normal.        Thought Content: Thought content normal.        Judgment: Judgment normal.     09/26/22-4/25/24CGM Download:  % Time CGM is active: 95% Average Glucose: 113 mg/dL Glucose Management Indicator: 6.0  Glucose  Variability: 21.7 (goal <36%) Time in Goal:  - Time in range 70-180: 98% - Time above range: 1% High (181-250 mg/dL) - Time below range: 1% Low (54-69 mg/dL)   Lab Results  Component Value Date   HGBA1C 13.2 (H) 08/08/2022   Vitals:   10/09/22 1536  BP: 137/86  Pulse: 87  SpO2: 98%     Lipid Panel     Component Value Date/Time   CHOL 186 08/24/2017 0939   TRIG 120 08/24/2017 0939   HDL 50 08/24/2017 0939   CHOLHDL 3.7 08/24/2017 0939   CHOLHDL 2.9 03/20/2011 0840   VLDL 15 03/20/2011 0840   LDLCALC 112 (H) 08/24/2017 0939    Clinical Atherosclerotic Cardiovascular Disease (ASCVD): Yes   Patient is participating in a Managed Medicaid Plan:  Yes   A/P: Diabetes diagnosed in 2024. Patient is able to verbalize appropriate hypoglycemia management plan. Medication adherence appears good. Control is optimal with 98% of CGM readings in target range. Low readings were related to compression while sleeping and are not concerning.  -Continued basal insulin Lantus (insulin glargine) 24 units daily in the morning.  -Continued rapid insulin Novolog (insulin aspart) from 8 units 3 times daily with meals.  -Continued metformin 500 mg daily.  -Extensively discussed pathophysiology of diabetes, recommended lifestyle interventions, dietary effects on blood sugar control.  -Counseled on s/sx of and management of hypoglycemia.  -Next A1c anticipated 11/06/22.   Hypertension diagnosed in 2012. Blood pressure goal of <130/80 mmHg. Medication adherence  is good.  -Continue carvedilol 12.5 mg BID with meals -Continue amlodipine 10 mg daily -Continue losartan-HCTZ 100-25 mg  Written patient instructions provided. Patient verbalized understanding of treatment plan.  Total time in face to face counseling 30 minutes.    Follow-up:  Pharmacist PRN (anticipate: July 2024) PCP clinic visit in 11/24/22 Patient seen with Revonda Standard, PharmD Candidate and Valeda Malm, PharmD, PGY2 Pharmacy  Resident.

## 2022-10-09 NOTE — Assessment & Plan Note (Signed)
Hypertension diagnosed in 2012. Blood pressure goal of <130/80 mmHg. Medication adherence is good.  -Continue carvedilol 12.5 mg BID with meals -Continue amlodipine 10 mg daily -Continue losartan-HCTZ 100-25 mg

## 2022-10-10 NOTE — Progress Notes (Signed)
Reviewed and agree with Dr Koval's plan.   

## 2022-10-15 ENCOUNTER — Other Ambulatory Visit (HOSPITAL_COMMUNITY): Payer: Self-pay

## 2022-10-15 DIAGNOSIS — Z930 Tracheostomy status: Secondary | ICD-10-CM | POA: Diagnosis not present

## 2022-10-15 DIAGNOSIS — C329 Malignant neoplasm of larynx, unspecified: Secondary | ICD-10-CM | POA: Diagnosis not present

## 2022-10-15 DIAGNOSIS — C321 Malignant neoplasm of supraglottis: Secondary | ICD-10-CM | POA: Diagnosis not present

## 2022-10-16 ENCOUNTER — Ambulatory Visit: Payer: Medicaid Other | Admitting: Podiatry

## 2022-10-16 ENCOUNTER — Encounter: Payer: Self-pay | Admitting: Podiatry

## 2022-10-16 DIAGNOSIS — E1140 Type 2 diabetes mellitus with diabetic neuropathy, unspecified: Secondary | ICD-10-CM | POA: Diagnosis not present

## 2022-10-16 DIAGNOSIS — M79674 Pain in right toe(s): Secondary | ICD-10-CM

## 2022-10-16 DIAGNOSIS — E1149 Type 2 diabetes mellitus with other diabetic neurological complication: Secondary | ICD-10-CM

## 2022-10-16 DIAGNOSIS — B351 Tinea unguium: Secondary | ICD-10-CM

## 2022-10-16 DIAGNOSIS — M79675 Pain in left toe(s): Secondary | ICD-10-CM

## 2022-10-16 NOTE — Progress Notes (Signed)
Subjective:   Patient ID: Deborah [name], female   DOB: 67 y.o.   MRN: [id number]   HPI Patient states she was diagnosed with diabetes and she has nails that she cannot take care of and states that her diabetes is coming under better control but may have been out of control for a significant period of time with moderate obesity also noted.  Patient does not currently smoke   Review of Systems  All other systems reviewed and are negative.       Objective:  Physical Exam Vitals and nursing note reviewed.  Constitutional:      Appearance: She is well-developed.  Pulmonary:     Effort: Pulmonary effort is normal.  Musculoskeletal:        General: Normal range of motion.  Skin:    General: Skin is warm.  Neurological:     Mental Status: She is alert.     Vascular status intact mild diminishment sharp dull vibratory bilateral patient had history of head neck cancer history of breast cancer also.  Patient has thickened nails that are incurvated and hard for her to cut and patient does have a trach     Assessment:  Long-term issues with diabetes diagnosed recently with better numbers but probable diabetic neuropathy to mild degree with nail disease 1-5 both feet thickened and discomforting      Plan:  H&P condition reviewed diabetic education rendered debrided nailbeds 1-5 both feet no angiogenic bleeding patient will be seen back as needed cannot take care of this herself at home as long as her sugar is under excellent control making her aware of that today

## 2022-10-18 NOTE — Progress Notes (Signed)
Patient Care Team: Littie Deeds, MD as PCP - General (Family Medicine) Serena Colonel, MD as Attending Physician (Otolaryngology) Dorothy Puffer, MD (Radiation Oncology) Artis Delay, MD as Consulting Physician (Hematology and Oncology) Emelia Loron, MD as Consulting Physician (General Surgery) Serena Croissant, MD as Consulting Physician (Hematology and Oncology) Lonie Peak, MD as Attending Physician (Radiation Oncology)  DIAGNOSIS: No diagnosis found.  SUMMARY OF ONCOLOGIC HISTORY: Oncology History  Ductal carcinoma in situ (DCIS) of right breast  05/26/2018 Initial Diagnosis   History of laryngeal cancer treated with chemoradiation, screening detected right breast calcifications medial right breast measuring 1.9 cm, biopsy revealed high-grade DCIS with comedonecrosis and calcifications ER 90%, PR 90%, Tis NX stage 0   06/02/2018 Cancer Staging   Staging form: Breast, AJCC 8th Edition - Clinical: Stage 0 (cTis (DCIS), cN0, cM0, ER+, PR+, HER2: Not Assessed) - Signed by Serena Croissant, MD on 06/02/2018   07/06/2018 Surgery   Right lumpectomy: No residual carcinoma identified, right medial margin excision: DCIS high-grade 1.5 cm margins negative, additional margins benign, ER 90%, PR 90%, Tis NX stage 0   09/06/2018 -  Anti-estrogen oral therapy   Anastrozole 1mg  daily   11/16/2018 - 12/06/2018 Radiation Therapy   Adjuvant radiation     CHIEF COMPLIANT: Follow-up of right breast DCIS on anastrozole   INTERVAL HISTORY: Deborah [name] is a 67 y.o. with above-mentioned history of right breast DCIS who underwent lumpectomy with re-excision, radiation, and is currently on antiestrogen therapy with anastrozole. She presents to the clinic for a follow-up.   ALLERGIES:  is allergic to lisinopril.  MEDICATIONS:  Current Outpatient Medications  Medication Sig Dispense Refill   albuterol (PROVENTIL) (5 MG/ML) 0.5% nebulizer solution Take 0.5 mLs (2.5 mg total) by nebulization  every 6 (six) hours as needed for wheezing or shortness of breath. Please give the patient 10 20ml vials. 200 mL 5   amLODipine (NORVASC) 10 MG tablet Take 1 tablet (10 mg total) by mouth daily. 90 tablet 1   anastrozole (ARIMIDEX) 1 MG tablet Take 1 tablet (1 mg total) by mouth daily. 30 tablet 0   BD PEN NEEDLE NANO 2ND GEN 32G X 4 MM MISC USE AS DIRECTED 4 TIMES DAILY WITH  INSULIN  PEN 100 each 0   benzonatate (TESSALON) 200 MG capsule TAKE 1 CAPSULE BY MOUTH TWICE DAILY AS NEEDED FOR COUGH 20 capsule 0   Blood Glucose Monitoring Suppl (ACCU-CHEK GUIDE) w/Device KIT Use in the morning, noon, and at bedtime to check blood glucose. 1 kit 0   carvedilol (COREG) 12.5 MG tablet Take 1 tablet (12.5 mg total) by mouth 2 (two) times daily with a meal. 180 tablet 3   cetirizine (EQ ALLERGY RELIEF, CETIRIZINE,) 10 MG tablet Take 1 tablet (10 mg total) by mouth daily. 90 tablet 1   Continuous Blood Gluc Sensor (FREESTYLE LIBRE 3 SENSOR) MISC Apply sensor to skin every 14 days for continuous glucose monitoring 2 each 11   escitalopram (LEXAPRO) 20 MG tablet Take 1 tablet (20 mg total) by mouth daily. 90 tablet 1   fluticasone (FLONASE) 50 MCG/ACT nasal spray Place 1 spray into both nostrils daily. 16 g 4   Humidifiers MISC 1 application by Tracheal Tube route as needed. 1 each 0   insulin aspart (NOVOLOG) 100 UNIT/ML FlexPen Inject 8 Units into the skin 3 (three) times daily with meals. 15 mL 1   insulin glargine (LANTUS) 100 UNIT/ML Solostar Pen Inject 24 Units into the skin daily. 15 mL 1  ipratropium (ATROVENT) 0.03 % nasal spray USE 2 SPRAY(S) IN EACH NOSTRIL EVERY 12 HOURS 30 mL 5   ipratropium-albuterol (DUONEB) 0.5-2.5 (3) MG/3ML SOLN Take 3 mLs by nebulization every 6 (six) hours. 360 mL 3   levothyroxine (SYNTHROID) 125 MCG tablet Take 1 tablet (125 mcg total) by mouth daily before breakfast. 30 tablet 0   losartan-hydrochlorothiazide (HYZAAR) 100-25 MG tablet Take 1 tablet by mouth daily. 90  tablet 1   metFORMIN (GLUCOPHAGE-XR) 500 MG 24 hr tablet Take 1 tablet by mouth once daily with breakfast 30 tablet 0   montelukast (SINGULAIR) 10 MG tablet Take 1 tablet (10 mg total) by mouth at bedtime. 90 tablet 1   nystatin cream (MYCOSTATIN) Apply 1 Application topically daily as needed for dry skin. 30 g 1   omeprazole (PRILOSEC) 40 MG capsule Take 1 capsule (40 mg total) by mouth 2 (two) times daily. 180 capsule 1   ondansetron (ZOFRAN) 4 MG tablet Take 1 tablet (4 mg total) by mouth every 8 (eight) hours as needed for nausea or vomiting. 20 tablet 0   sodium chloride HYPERTONIC 3 % nebulizer solution Take by nebulization 3 (three) times daily. (Patient taking differently: Take 4 mLs by nebulization as needed for cough.) 120 mL 5   No current facility-administered medications for this visit.    PHYSICAL EXAMINATION: ECOG PERFORMANCE STATUS: {CHL ONC ECOG PS:[id number]}  There were no vitals filed for this visit. There were no vitals filed for this visit.  BREAST:*** No palpable masses or nodules in either right or left breasts. No palpable axillary supraclavicular or infraclavicular adenopathy no breast tenderness or nipple discharge. (exam performed in the presence of a chaperone)  LABORATORY DATA:  I have reviewed the data as listed    Latest Ref Rng & Units 08/14/2022   12:09 PM 08/12/2022    3:54 AM 08/11/2022    4:06 AM  CMP  Glucose 70 - 99 mg/dL 409  811  914   BUN 8 - 27 mg/dL 13  14  11    Creatinine 0.57 - 1.00 mg/dL 7.82  9.56  2.13   Sodium 134 - 144 mmol/L 136  132  134   Potassium 3.5 - 5.2 mmol/L 4.5  4.1  4.1   Chloride 96 - 106 mmol/L 97  96  98   CO2 20 - 29 mmol/L 21  24  25    Calcium 8.7 - 10.3 mg/dL 9.9  9.0  9.8   Total Protein 6.5 - 8.1 g/dL  6.8  7.7   Total Bilirubin 0.3 - 1.2 mg/dL  0.8  0.7   Alkaline Phos 38 - 126 U/L  113  121   AST 15 - 41 U/L  71  95   ALT 0 - 44 U/L  75  83     Lab Results  Component Value Date   WBC 6.9 08/14/2022    HGB 12.8 08/14/2022   HCT 38.5 08/14/2022   MCV 88 08/14/2022   PLT 395 08/14/2022   NEUTROABS 2.9 08/12/2022    ASSESSMENT & PLAN:  No problem-specific Assessment & Plan notes found for this encounter.    No orders of the defined types were placed in this encounter.  The patient has a good understanding of the overall plan. she agrees with it. she will call with any problems that may develop before the next visit here. Total time spent: 30 mins including face to face time and time spent for planning, charting and co-ordination of care  Laurier Jasperson Mickie Kay, CMA 10/18/22    I Janan Ridge am acting as a Neurosurgeon for [employer]  ***

## 2022-10-20 DIAGNOSIS — C321 Malignant neoplasm of supraglottis: Secondary | ICD-10-CM | POA: Diagnosis not present

## 2022-10-20 DIAGNOSIS — Z8521 Personal history of malignant neoplasm of larynx: Secondary | ICD-10-CM | POA: Diagnosis not present

## 2022-10-20 DIAGNOSIS — Z96652 Presence of left artificial knee joint: Secondary | ICD-10-CM | POA: Diagnosis not present

## 2022-10-20 DIAGNOSIS — Z08 Encounter for follow-up examination after completed treatment for malignant neoplasm: Secondary | ICD-10-CM | POA: Diagnosis not present

## 2022-10-20 DIAGNOSIS — M1712 Unilateral primary osteoarthritis, left knee: Secondary | ICD-10-CM | POA: Diagnosis not present

## 2022-10-20 DIAGNOSIS — Z930 Tracheostomy status: Secondary | ICD-10-CM | POA: Diagnosis not present

## 2022-10-20 DIAGNOSIS — C329 Malignant neoplasm of larynx, unspecified: Secondary | ICD-10-CM | POA: Diagnosis not present

## 2022-10-21 ENCOUNTER — Other Ambulatory Visit: Payer: Self-pay

## 2022-10-21 ENCOUNTER — Ambulatory Visit: Payer: Medicaid Other | Admitting: Family Medicine

## 2022-10-21 ENCOUNTER — Inpatient Hospital Stay: Payer: Medicaid Other | Attending: Hematology and Oncology | Admitting: Hematology and Oncology

## 2022-10-21 VITALS — BP 146/78 | HR 95 | Temp 97.6°F | Resp 17 | Ht 67.0 in | Wt 222.0 lb

## 2022-10-21 DIAGNOSIS — Z79811 Long term (current) use of aromatase inhibitors: Secondary | ICD-10-CM | POA: Diagnosis not present

## 2022-10-21 DIAGNOSIS — Z888 Allergy status to other drugs, medicaments and biological substances status: Secondary | ICD-10-CM | POA: Insufficient documentation

## 2022-10-21 DIAGNOSIS — D0511 Intraductal carcinoma in situ of right breast: Secondary | ICD-10-CM

## 2022-10-21 DIAGNOSIS — Z79899 Other long term (current) drug therapy: Secondary | ICD-10-CM | POA: Diagnosis not present

## 2022-10-21 DIAGNOSIS — Z8521 Personal history of malignant neoplasm of larynx: Secondary | ICD-10-CM | POA: Insufficient documentation

## 2022-10-21 DIAGNOSIS — Z923 Personal history of irradiation: Secondary | ICD-10-CM | POA: Insufficient documentation

## 2022-10-21 MED ORDER — ANASTROZOLE 1 MG PO TABS: 1.0000 mg | ORAL_TABLET | Freq: Every day | ORAL | 1 refills | Status: DC

## 2022-10-21 NOTE — Assessment & Plan Note (Addendum)
07/05/2018: Right lumpectomy: No residual carcinoma identified, right medial margin excision: DCIS high-grade 1.5 cm margins negative, additional margins benign, ER 90%, PR 90%, Tis NX stage 0   Current treatment: Anastrozole daily began in 08/2018 Anastrozole toxicities: Denies any adverse effects like hot flashes or myalgias or arthralgias. She will complete her treatment by December 2024.   Breast cancer surveillance: 1. Mammogram in 08/07/2022 benign, breast density category A [place] millimeters 2. Bone density 03/2019 normal     Return to clinic on an as-needed basis

## 2022-10-25 ENCOUNTER — Other Ambulatory Visit: Payer: Self-pay | Admitting: Family Medicine

## 2022-11-03 ENCOUNTER — Telehealth: Payer: Self-pay | Admitting: Pharmacist

## 2022-11-03 NOTE — Telephone Encounter (Signed)
Needs assistance with new phone and CGM app function/download.  Scheduled appointment on 5/23 at 2:30 or 2:45 (visit is scheduled for 3:00 but patient also has another 3:30 appt  in [place]  We will try to see her early.

## 2022-11-06 ENCOUNTER — Ambulatory Visit: Payer: Medicaid Other | Admitting: Pharmacist

## 2022-11-06 ENCOUNTER — Encounter: Payer: Self-pay | Admitting: Pharmacist

## 2022-11-06 VITALS — BP 133/78 | HR 91 | Ht 67.5 in | Wt 223.4 lb

## 2022-11-06 DIAGNOSIS — K222 Esophageal obstruction: Secondary | ICD-10-CM | POA: Diagnosis not present

## 2022-11-06 DIAGNOSIS — E109 Type 1 diabetes mellitus without complications: Secondary | ICD-10-CM | POA: Diagnosis not present

## 2022-11-06 DIAGNOSIS — Z923 Personal history of irradiation: Secondary | ICD-10-CM | POA: Diagnosis not present

## 2022-11-06 MED ORDER — INSULIN GLARGINE 100 UNIT/ML SOLOSTAR PEN: 21.0000 [IU] | PEN_INJECTOR | Freq: Every day | SUBCUTANEOUS | 1 refills | Status: DC

## 2022-11-06 MED ORDER — INSULIN ASPART 100 UNIT/ML FLEXPEN: 7.0000 [IU] | PEN_INJECTOR | Freq: Three times a day (TID) | SUBCUTANEOUS | 1 refills | Status: DC

## 2022-11-06 NOTE — Progress Notes (Signed)
S:     Chief Complaint  Patient presents with   Medication Management    T2DM and CGM f/u   67 y.o. female who presents for diabetes evaluation, education, and management.  PMH is significant for T2DM, HTN. .  Patient was referred and last seen by Primary Care Provider, Dr. Wynelle Link, on 08/14/2022. Last seen by pharmacy clinic on 10/09/2022. At last visit, patient was seen for T2DM and CGM f/u and continued basal insulin Lantus (insulin glargine) 24 units daily in the morning, rapid insulin Novolog (insulin aspart) from 8 units 3 times daily with meals, and metformin 500 mg daily.   Today, patient arrives in good spirits and presents without any assistance.   Patient reports Diabetes was diagnosed in 2024.   Current diabetes medications include: basal insulin Lantus (insulin glargine) 24 units daily in the morning, rapid insulin Novolog (insulin aspart) from 8 units 3 times daily with meals, and metformin 500 mg daily. Current hypertension medications include: amlodipine 10 mg, losartan-HCTZ 100-25 mg daily, carvedilol 12.5 mg BID  Patient reports adherence to taking all medications as prescribed. Uses a 30-day pill pack.   Do you feel that your medications are working for you? yes Have you been experiencing any side effects to the medications prescribed? no Do you have any problems obtaining medications due to transportation or finances? no Insurance coverage: [place] Medicaid  Patient reports hypoglycemic events. Rare, addressed by drinking orange juice.   Patient reports nocturia (nighttime urination).  Patient denies neuropathy (nerve pain). Patient denies visual changes. Patient reports self foot exams.   O:   Review of Systems  All other systems reviewed and are negative.   Physical Exam Constitutional:      Appearance: Normal appearance.  Cardiovascular:     Pulses: Normal pulses.  Neurological:     Mental Status: She is alert.  Psychiatric:        Mood and Affect:  Mood normal.        Behavior: Behavior normal.        Thought Content: Thought content normal.      CGM Download:  % Time CGM is active: 91% Average Glucose: 107 mg/dL Glucose Management Indicator: 5.9%  Glucose Variability: 19.2% (goal <36%) Time in Goal:  - Time in range 70-180: 99% - Time above range: 0% - Time below range: 1% Observed patterns:   Lab Results  Component Value Date   HGBA1C 13.2 (H) 08/08/2022   Vitals:   11/06/22 1427  BP: 133/78  Pulse: 91  SpO2: 92%    Lipid Panel     Component Value Date/Time   CHOL 186 08/24/2017 0939   TRIG 120 08/24/2017 0939   HDL 50 08/24/2017 0939   CHOLHDL 3.7 08/24/2017 0939   CHOLHDL 2.9 03/20/2011 0840   VLDL 15 03/20/2011 0840   LDLCALC 112 (H) 08/24/2017 0939    Clinical Atherosclerotic Cardiovascular Disease (ASCVD): No  The ASCVD Risk score (Arnett DK, et al., 2019) failed to calculate for the following reasons:   Cannot find a previous HDL lab   Cannot find a previous total cholesterol lab   Patient is participating in a Managed Medicaid Plan:  Yes   A/P: Diabetes diagnosed in 2024. Patient is able to verbalize appropriate hypoglycemia management plan. Medication adherence appears good. Control is optimal with 99% of CGM readings in target range. Patient reports hypoglycemic episodes occurring rarely which she addresses by drinking orange juice. -Decreased insulin Aspart (Novolog) to 7 units three times  daily with meals.  -Decrease insulin Glargine (Lantus) to 21 units daily. -Continued metformin 500 mg daily -Patient assisted with application and setup of CGM sensor.  -Extensively discussed pathophysiology of diabetes, recommended lifestyle interventions, dietary effects on blood sugar control.  -Counseled on s/sx of and management of hypoglycemia.  -Next A1c anticipated at next PCP visit on 11/24/2022 with most recent A1c at 13.2 mg/dL from 9/62/9528.   Drug-Induced Urinary Retention -Patient endorses  nighttime urinary retention episodes -Patient also shares frequent use of dyphenhydramine (Benadryl) for allergies -Encouraged to try OTC fexofenadine (Allegra) or loratidine (Claritin) instead of Benadryl for allergies to lower risk of urinary retention  Written patient instructions provided. Patient verbalized understanding of treatment plan.  Total time in face to face counseling 26 minutes.    Follow-up:  Pharmacist PRN. PCP clinic visit in 11/24/2022.  Patient seen with Bing Plume, PharmD Candidate and Valeda Malm, PharmD, PGY2 Pharmacy Resident.

## 2022-11-06 NOTE — Progress Notes (Signed)
Reviewed and agree with Dr Koval's plan.   

## 2022-11-06 NOTE — Patient Instructions (Addendum)
It was nice to see you today!  Your goal blood sugar is 80-130 before eating and less than 180 after eating.  Medication Changes:  Decrease insulin Aspart (Novolog) to 7 units three times daily with meals.   Decrease insulin Glargine (Lantus) to 21 units daily.  Continue taking all other medications as prescribed.    For your allergies, try fexofenadine (Allegra) or loratidine (Claritin) instead of Benadryl to lower risk of urinary retention.   Monitor blood sugars at home and keep a log (glucometer or piece of paper) to bring with you to your next visit.  Keep up the good work with diet and exercise. Aim for a diet full of vegetables, fruit and lean meats (chicken, [place], fish). Try to limit salt intake by eating fresh or frozen vegetables (instead of canned), rinse canned vegetables prior to cooking and do not add any additional salt to meals.

## 2022-11-06 NOTE — Assessment & Plan Note (Signed)
>>  ASSESSMENT AND PLAN FOR KETOSIS-PRONE DIABETES MELLITUS (HCC) WRITTEN ON 11/06/2022  3:21 PM BY Josip Merolla G, RPH-CPP  Diabetes diagnosed in 2024. Patient is able to verbalize appropriate hypoglycemia management plan. Medication adherence appears good. Control is optimal with 99% of CGM readings in target range. Patient reports hypoglycemic episodes occurring rarely which she addresses by drinking orange juice. -Decreased insulin  Aspart (Novolog ) to 7 units three times daily with meals.  -Decrease insulin  Glargine (Lantus ) to 21 units daily. -Continued metformin  500 mg daily -Patient assisted with application and setup of CGM sensor.  -Extensively discussed pathophysiology of diabetes, recommended lifestyle interventions, dietary effects on blood sugar control.  -Counseled on s/sx of and management of hypoglycemia.

## 2022-11-06 NOTE — Assessment & Plan Note (Signed)
Diabetes diagnosed in 2024. Patient is able to verbalize appropriate hypoglycemia management plan. Medication adherence appears good. Control is optimal with 99% of CGM readings in target range. Patient reports hypoglycemic episodes occurring rarely which she addresses by drinking orange juice. -Decreased insulin Aspart (Novolog) to 7 units three times daily with meals.  -Decrease insulin Glargine (Lantus) to 21 units daily. -Continued metformin 500 mg daily -Patient assisted with application and setup of CGM sensor.  -Extensively discussed pathophysiology of diabetes, recommended lifestyle interventions, dietary effects on blood sugar control.  -Counseled on s/sx of and management of hypoglycemia.

## 2022-11-14 DIAGNOSIS — Z9002 Acquired absence of larynx: Secondary | ICD-10-CM | POA: Diagnosis not present

## 2022-11-14 DIAGNOSIS — Z449 Encounter for fitting and adjustment of unspecified external prosthetic device: Secondary | ICD-10-CM | POA: Diagnosis not present

## 2022-11-17 ENCOUNTER — Other Ambulatory Visit: Payer: Self-pay | Admitting: Family Medicine

## 2022-11-24 ENCOUNTER — Other Ambulatory Visit: Payer: Self-pay

## 2022-11-24 ENCOUNTER — Encounter: Payer: Self-pay | Admitting: Family Medicine

## 2022-11-24 ENCOUNTER — Ambulatory Visit (INDEPENDENT_AMBULATORY_CARE_PROVIDER_SITE_OTHER): Payer: Medicaid Other | Admitting: Family Medicine

## 2022-11-24 VITALS — BP 109/58 | HR 69 | Ht 67.5 in | Wt 229.0 lb

## 2022-11-24 DIAGNOSIS — E039 Hypothyroidism, unspecified: Secondary | ICD-10-CM

## 2022-11-24 DIAGNOSIS — E109 Type 1 diabetes mellitus without complications: Secondary | ICD-10-CM | POA: Diagnosis not present

## 2022-11-24 LAB — POCT GLYCOSYLATED HEMOGLOBIN (HGB A1C): HbA1c, POC (controlled diabetic range): 6.1 % (ref 0.0–7.0)

## 2022-11-24 NOTE — Assessment & Plan Note (Signed)
>>  ASSESSMENT AND PLAN FOR KETOSIS-PRONE DIABETES MELLITUS (HCC) WRITTEN ON 11/24/2022  5:14 PM BY AUSTIN ADE, MD  Excellent control on current regimen.  Will plan to initiate and discuss statin use after labs result.

## 2022-11-24 NOTE — Assessment & Plan Note (Signed)
Excellent control on current regimen.  Will plan to initiate and discuss statin use after labs result.

## 2022-11-24 NOTE — Assessment & Plan Note (Signed)
Repeat TSH given recent dose adjustment

## 2022-11-24 NOTE — Progress Notes (Signed)
    SUBJECTIVE:   CHIEF COMPLAINT / HPI:  Chief Complaint  Patient presents with   Follow-up    Diabetes     Patient was last seen in pharmacy clinic for diabetes management about 3 weeks ago.  At that time, insulin regimen was decreased to Lantus 21 units daily and NovoLog 7 units 3 times daily with meals.  She was maintained on metformin 500 mg daily.  She reports she is doing well on the insulin.  She reports she is still taking Lantus 24 units daily. Had hypoglycemia 1 episode last week with CBG in the 50s, but other than that has not had any recent hypoglycemic episodes.  Currently taking levothyroxine 125 mcg daily.  PERTINENT  PMH / PSH: asthma/COPD, allergic rhinitis, HTN, hypothyroidism, laryngeal cancer s/p radiation   Patient Care Team: Littie Deeds, MD as PCP - General (Family Medicine) Serena Colonel, MD as Attending Physician (Otolaryngology) Dorothy Puffer, MD (Radiation Oncology) Artis Delay, MD as Consulting Physician (Hematology and Oncology) Emelia Loron, MD as Consulting Physician (General Surgery) Serena Croissant, MD as Consulting Physician (Hematology and Oncology) Lonie Peak, MD as Attending Physician (Radiation Oncology)   OBJECTIVE:   BP (!) 109/58   Pulse 69   Ht 5' 7.5" (1.715 m)   Wt 229 lb (103.9 kg)   SpO2 93%   BMI 35.34 kg/m   Physical Exam Constitutional:      General: She is not in acute distress. Cardiovascular:     Rate and Rhythm: Normal rate and regular rhythm.  Pulmonary:     Effort: Pulmonary effort is normal. No respiratory distress.     Breath sounds: Normal breath sounds.  Musculoskeletal:     Cervical back: Neck supple.  Neurological:     Mental Status: She is alert.         11/24/2022    3:36 PM  Depression screen PHQ 2/9  Decreased Interest 0  Down, Depressed, Hopeless 0  PHQ - 2 Score 0  Altered sleeping 0  Tired, decreased energy 0  Change in appetite 0  Feeling bad or failure about yourself  0  Trouble  concentrating 0  Moving slowly or fidgety/restless 0  Suicidal thoughts 0  PHQ-9 Score 0     {Show previous vital signs (optional):23777}  Last hemoglobin A1c Lab Results  Component Value Date   HGBA1C 6.1 11/24/2022      ASSESSMENT/PLAN:   Problem List Items Addressed This Visit       Endocrine   Ketosis-prone diabetes mellitus (HCC) - Primary    Excellent control on current regimen.  Will plan to initiate and discuss statin use after labs result.      Relevant Orders   HgB A1c (Completed)   Hypothyroidism    Repeat TSH given recent dose adjustment      Relevant Orders   TSH       Return in about 3 months (around 02/24/2023) for f/u diabetes.   Littie Deeds, MD [facility]

## 2022-11-24 NOTE — Patient Instructions (Signed)
It was nice seeing you today!  Blood work today.  See me in 3 months or whenever is a good for you.  Stay well, Shakerra Red, MD [facility] [phone]  --  Make sure to check out at the front desk before you leave today.  Please arrive at least 15 minutes prior to your scheduled appointments.  If you had blood work today, I will send you a MyChart message or a letter if results are normal. Otherwise, I will give you a call.  If you had a referral placed, they will call you to set up an appointment. Please give us a call if you don't hear back in the next 2 weeks.  If you need additional refills before your next appointment, please call your pharmacy first.  

## 2022-11-25 LAB — TSH: TSH: 16.8 u[IU]/mL — ABNORMAL HIGH (ref 0.450–4.500)

## 2022-11-28 ENCOUNTER — Telehealth: Payer: Self-pay | Admitting: Family Medicine

## 2022-11-28 DIAGNOSIS — E109 Type 1 diabetes mellitus without complications: Secondary | ICD-10-CM

## 2022-11-28 DIAGNOSIS — E039 Hypothyroidism, unspecified: Secondary | ICD-10-CM

## 2022-11-28 MED ORDER — ROSUVASTATIN CALCIUM 5 MG PO TABS
5.0000 mg | ORAL_TABLET | Freq: Every day | ORAL | 3 refills | Status: DC
Start: 2022-11-28 — End: 2023-03-23

## 2022-11-28 MED ORDER — LEVOTHYROXINE SODIUM 150 MCG PO TABS
150.0000 ug | ORAL_TABLET | ORAL | 3 refills | Status: DC
Start: 2022-11-28 — End: 2023-03-23

## 2022-11-28 NOTE — Telephone Encounter (Signed)
Called patient to discuss lab results.  TSH elevated, this suggest she needs to increase her thyroid replacement.  I have increased her levothyroxine to 150 mcg.  She will need repeat TSH in approximately 6 weeks.  Also discussed initiating statin with patient.  Since she is a diabetic, increased risk for MI and CVA.  She is amenable to starting statin.  Rosuvastatin sent in.

## 2022-12-04 ENCOUNTER — Other Ambulatory Visit: Payer: Self-pay | Admitting: Family Medicine

## 2022-12-04 DIAGNOSIS — F339 Major depressive disorder, recurrent, unspecified: Secondary | ICD-10-CM

## 2022-12-11 ENCOUNTER — Encounter: Payer: Self-pay | Admitting: Family Medicine

## 2022-12-11 ENCOUNTER — Ambulatory Visit (INDEPENDENT_AMBULATORY_CARE_PROVIDER_SITE_OTHER): Payer: Medicaid Other | Admitting: Family Medicine

## 2022-12-11 VITALS — BP 193/104 | HR 89 | Temp 98.1°F | Ht 67.5 in | Wt 223.4 lb

## 2022-12-11 DIAGNOSIS — J441 Chronic obstructive pulmonary disease with (acute) exacerbation: Secondary | ICD-10-CM | POA: Diagnosis not present

## 2022-12-11 MED ORDER — AMOXICILLIN-POT CLAVULANATE 875-125 MG PO TABS
1.0000 | ORAL_TABLET | Freq: Two times a day (BID) | ORAL | 0 refills | Status: DC
Start: 2022-12-11 — End: 2023-05-21

## 2022-12-11 MED ORDER — PREDNISONE 20 MG PO TABS
40.0000 mg | ORAL_TABLET | Freq: Every day | ORAL | 0 refills | Status: AC
Start: 2022-12-11 — End: 2022-12-16

## 2022-12-11 MED ORDER — GUAIFENESIN-CODEINE 100-10 MG/5ML PO SOLN: 10.0000 mL | Freq: Four times a day (QID) | ORAL | 0 refills | Status: DC | PRN

## 2022-12-11 NOTE — Patient Instructions (Addendum)
I believe you are having a bronchitis flare.  Please start taking Augmentin tablet, one tablet twice a day for 5 days. Take Prednisone 20 mg tablet, two tablets once a day for 5 days.   Robitussin with codene, take 2 teaspoons every 6 hour for coughing.   If not improving over the next two days, then contact out office to discuss going to the ED.

## 2022-12-12 ENCOUNTER — Encounter: Payer: Self-pay | Admitting: Family Medicine

## 2022-12-12 DIAGNOSIS — J441 Chronic obstructive pulmonary disease with (acute) exacerbation: Secondary | ICD-10-CM | POA: Insufficient documentation

## 2022-12-12 NOTE — Assessment & Plan Note (Signed)
New problem CC: hard coughing Onset: 2 days ago Has been using her home neb meds without improvement.  Progressive worsening Cough is productive Coughing so hard she expelled her trach tube and had to go to [facility] Speech Therapist to have the tube replaced.   Illness began as head cold and progreesed into chest.  No fever. (+) nasal congestion.  Physical Afeb VSS Gen: no acute disease except when has an episode of loug, harsh, wet-sounding coughing for5 to 10 sec  Lung Bilaterally clear of wheezing and crackles.  UAW noise bilateral present.  No acc mm use.  Cor: RRR Ext: no edema, no asymmetry  A/ Working diagnosis of COPD exacerbation P/ Start Prednisone 4o mg daily x 5 days. Prescription Augmentin 875 twice a day x 5 days.  RTC or UC if unimproved in 48 hours or if condition worsens.

## 2022-12-12 NOTE — Progress Notes (Signed)
Deborah [name] is alone Sources of clinical information for visit is/are patient. Nursing assessment for this office visit was reviewed with the patient for accuracy and revision.     Previous Report(s) Reviewed: office notes     11/24/2022    3:36 PM  Depression screen PHQ 2/9  Decreased Interest 0  Down, Depressed, Hopeless 0  PHQ - 2 Score 0  Altered sleeping 0  Tired, decreased energy 0  Change in appetite 0  Feeling bad or failure about yourself  0  Trouble concentrating 0  Moving slowly or fidgety/restless 0  Suicidal thoughts 0  PHQ-9 Score 0   Flowsheet Row Office Visit from 11/24/2022 in [facility] Office Visit from 08/14/2022 in [facility] Office Visit from 07/03/2022 in [facility]  Thoughts that you would be better off dead, or of hurting yourself in some way Not at all Not at all Not at all  PHQ-9 Total Score 0 0 0          11/24/2022    3:36 PM 09/25/2022    4:24 PM 07/17/2021    4:35 PM 08/09/2019    2:26 PM 11/23/2018   11:18 AM  Fall Risk   Falls in the past year? 0 0 0 0 0  Number falls in past yr: 0  0 0   Injury with Fall? 0  0 0        11/24/2022    3:36 PM 09/25/2022    4:24 PM 08/14/2022    8:39 AM  PHQ9 SCORE ONLY  PHQ-9 Total Score 0 0 0    There are no preventive care reminders to display for this patient.  Health Maintenance Due  Topic Date Due   FOOT EXAM  Never done   OPHTHALMOLOGY EXAM  Never done   Diabetic kidney evaluation - Urine ACR  Never done   Zoster Vaccines- Shingrix (1 of 2) Never done   Lung Cancer Screening  08/30/2022   COVID-19 Vaccine (7 - 2023-24 season) 10/09/2022   Colonoscopy  11/16/2022      History/P.E. limitations: none  There are no preventive care reminders to display for this patient.  Diabetes Health Maintenance Due  Topic Date Due   FOOT EXAM  Never done   OPHTHALMOLOGY EXAM  Never done   HEMOGLOBIN A1C  05/26/2023     Health Maintenance Due  Topic Date Due   FOOT EXAM  Never done   OPHTHALMOLOGY EXAM  Never done   Diabetic kidney evaluation - Urine ACR  Never done   Zoster Vaccines- Shingrix (1 of 2) Never done   Lung Cancer Screening  08/30/2022   COVID-19 Vaccine (7 - 2023-24 season) 10/09/2022   Colonoscopy  11/16/2022     Chief Complaint  Patient presents with   Cough   Fatigue   Sore Throat     --------------------------------------------------------------------------------------------------------------------------------------------- Visit Problem List with A/P  COPD with acute exacerbation (HCC) New problem CC: hard coughing Onset: 2 days ago Has been using her home neb meds without improvement.  Progressive worsening Cough is productive Coughing so hard she expelled her trach tube and had to go to [facility] Speech Therapist to have the tube replaced.   Illness began as head cold and progreesed into chest.  No fever. (+) nasal congestion.  Physical Afeb VSS Gen: no acute disease except when has an episode of loug, harsh, wet-sounding coughing for5 to 10 sec  Lung Bilaterally clear  of wheezing and crackles.  UAW noise bilateral present.  No acc mm use.  Cor: RRR Ext: no edema, no asymmetry  A/ Working diagnosis of COPD exacerbation P/ Start Prednisone 4o mg daily x 5 days. Prescription Augmentin 875 twice a day x 5 days.  RTC or UC if unimproved in 48 hours or if condition worsens.

## 2022-12-22 ENCOUNTER — Other Ambulatory Visit: Payer: Self-pay

## 2022-12-22 DIAGNOSIS — E109 Type 1 diabetes mellitus without complications: Secondary | ICD-10-CM

## 2022-12-22 MED ORDER — INSULIN GLARGINE 100 UNIT/ML SOLOSTAR PEN
21.0000 [IU] | PEN_INJECTOR | Freq: Every day | SUBCUTANEOUS | 1 refills | Status: DC
Start: 2022-12-22 — End: 2023-03-05

## 2022-12-23 ENCOUNTER — Other Ambulatory Visit: Payer: Self-pay | Admitting: Family Medicine

## 2022-12-23 DIAGNOSIS — E109 Type 1 diabetes mellitus without complications: Secondary | ICD-10-CM

## 2022-12-23 MED ORDER — INSULIN LISPRO (1 UNIT DIAL) 100 UNIT/ML (KWIKPEN)
7.0000 [IU] | PEN_INJECTOR | Freq: Three times a day (TID) | SUBCUTANEOUS | 3 refills | Status: DC
Start: 2022-12-23 — End: 2023-06-16

## 2022-12-23 NOTE — Progress Notes (Signed)
Changed Novolog to humalog pen. Same dose and frequency

## 2023-01-15 NOTE — Progress Notes (Deleted)
    SUBJECTIVE:   CHIEF COMPLAINT / HPI:   Continued Stomach Issues? Hx of GERD, Seen in hospital for DKA in February and complained of issues Taking metformin, also has prilosec and zofran on med list.  PERTINENT  PMH / PSH: GERD, T2DM  OBJECTIVE:   There were no vitals taken for this visit.  ***  ASSESSMENT/PLAN:   No problem-specific Assessment & Plan notes found for this encounter.     Gerrit Heck, DO [facility]

## 2023-01-16 ENCOUNTER — Ambulatory Visit: Payer: Medicaid Other | Admitting: Family Medicine

## 2023-01-17 ENCOUNTER — Other Ambulatory Visit: Payer: Self-pay | Admitting: Pulmonary Disease

## 2023-01-17 DIAGNOSIS — J4540 Moderate persistent asthma, uncomplicated: Secondary | ICD-10-CM

## 2023-02-09 ENCOUNTER — Encounter: Payer: Self-pay | Admitting: Student

## 2023-02-09 ENCOUNTER — Ambulatory Visit (INDEPENDENT_AMBULATORY_CARE_PROVIDER_SITE_OTHER): Payer: Medicare HMO | Admitting: Student

## 2023-02-09 VITALS — BP 144/89 | HR 88 | Ht 67.5 in | Wt 222.0 lb

## 2023-02-09 DIAGNOSIS — Z23 Encounter for immunization: Secondary | ICD-10-CM | POA: Diagnosis not present

## 2023-02-09 DIAGNOSIS — E109 Type 1 diabetes mellitus without complications: Secondary | ICD-10-CM | POA: Diagnosis not present

## 2023-02-09 MED ORDER — DEXCOM G7 RECEIVER DEVI
1.0000 | Freq: Once | 0 refills | Status: DC
Start: 2023-02-09 — End: 2023-03-21

## 2023-02-09 MED ORDER — DEXCOM G7 SENSOR MISC: 1.0000 | 11 refills | Status: DC

## 2023-02-09 MED ORDER — SHINGRIX 50 MCG/0.5ML IM SUSR
0.5000 mL | INTRAMUSCULAR | 0 refills | Status: AC
Start: 2023-02-09 — End: 2023-04-11

## 2023-02-09 NOTE — Progress Notes (Signed)
Asked by Dr. Jena Gauss to meet with patient RE her CGM functionality.   Patient reports issues with CGM including the Libre 3 sensors failing to adhere for the entire 14 days.   Following brief discussion it was determined that she has recently switched to [facility] which prefers Dexcom CGM.   Patient also has new phone.  Following > 30 minutes of attempting set-up it was determined that her "new Phone" has not yet been approved for [facility] G7 use.  (She will need a Armed forces logistics/support/administrative officer).  She will need to obtain from her pharmacy and return for education and initiation.   New prescriptions for both Dexcom G7 sensor and receiver sent to her pharmacy.  She was scheduled for follow-up with me in ~ 1 week.  She was asked to bring sensors and receiver to the visit.   Total time in visit and documentation 43 minutes.

## 2023-02-09 NOTE — Progress Notes (Signed)
  SUBJECTIVE:   CHIEF COMPLAINT / HPI:   Discuss CGM:   Patient has had a CGM (Libre 3). The Josephine Igo 3 has been falling off and was unable to connect to her phone.  She has been through four sensors at this point.  Patient is expressing frustration and anticipated seeing Dr. Raymondo Band today instead of seeing one of the residents.  Most recent A1Cs:  Lab Results  Component Value Date   HGBA1C 6.1 11/24/2022   HGBA1C 13.2 (H) 08/08/2022   Last Microalbumin, LDL, Creatinine: Lab Results  Component Value Date   LDLCALC 112 (H) 08/24/2017   CREATININE 0.94 08/14/2022     PERTINENT  PMH / PSH: asthma/COPD, allergic rhinitis, HTN, hypothyroidism, laryngeal cancer s/p radiation   Patient Care Team: Gerrit Heck, DO as PCP - General (Family Medicine) Serena Colonel, MD as Attending Physician (Otolaryngology) Dorothy Puffer, MD (Radiation Oncology) Artis Delay, MD as Consulting Physician (Hematology and Oncology) Emelia Loron, MD as Consulting Physician (General Surgery) Serena Croissant, MD as Consulting Physician (Hematology and Oncology) Lonie Peak, MD as Attending Physician (Radiation Oncology) OBJECTIVE:  BP (!) 144/89   Pulse 88   Ht 5' 7.5" (1.715 m)   Wt 222 lb (100.7 kg)   SpO2 96%   BMI 34.26 kg/m  Physical Exam  General: NAD, pleasant, able to participate in exam Respiratory: No respiratory distress Skin: warm and dry, no rashes noted Psych: Normal affect and mood  ASSESSMENT/PLAN:  Ketosis-prone diabetes mellitus (HCC) Assessment & Plan: Transition to the [facility] sensor.  Dr. Raymondo Band spent a long time in the room with the patient trying to put on the Dexcom sensor.  She is returning to Dr. Raymondo Band at the next week for further assistance connecting the sensor to her phone.  On statin, obtain urine microalbumin today.  Will discussed eye and foot exam on next visit in 3 months.  Follow-up with Dr. Raymondo Band in the meantime.  Orders: -     Microalbumin / creatinine urine  ratio  Need for shingles vaccine -     Shingrix; Inject 0.5 mLs into the muscle every 2 (two) months for 2 doses.  Dispense: 1 mL; Refill: 0  Patient is requesting that we send her notification when we get the flu, COVID, PCV 21 vaccines. Return in about 3 months (around 05/12/2023). Alfredo Martinez, MD 02/09/2023, 4:35 PM PGY-3, [facility]

## 2023-02-09 NOTE — Addendum Note (Signed)
Addended by: Kathrin Ruddy on: 02/09/2023 05:10 PM   Modules accepted: Orders

## 2023-02-09 NOTE — Assessment & Plan Note (Signed)
Transition to the [facility] sensor.  Dr. Raymondo Band spent a long time in the room with the patient trying to put on the Dexcom sensor.  She is returning to Dr. Raymondo Band at the next week for further assistance connecting the sensor to her phone.  On statin, obtain urine microalbumin today.  Will discussed eye and foot exam on next visit in 3 months.  Follow-up with Dr. Raymondo Band in the meantime.

## 2023-02-09 NOTE — Patient Instructions (Addendum)
It was great to see you today! Thank you for choosing [facility] for your primary care.  Today we addressed: I have ordered the shingrix for you Follow up in 3 months  Return for the COVID, PCV, and flu   If you haven't already, sign up for My Chart to have easy access to your labs results, and communication with your primary care physician. I recommend that you always bring your medications to each appointment as this makes it easy to ensure you are on the correct medications and helps [place] not miss refills when you need them. Call the clinic at [phone] if your symptoms worsen or you have any concerns.  Return in about 3 months (around 05/12/2023). Please arrive 15 minutes before your appointment to ensure smooth check in process.  We appreciate your efforts in making this happen.  Thank you for allowing me to participate in your care, Alfredo Martinez, MD 02/09/2023, 4:19 PM PGY-3, [facility]

## 2023-02-09 NOTE — Assessment & Plan Note (Signed)
>>  ASSESSMENT AND PLAN FOR KETOSIS-PRONE DIABETES MELLITUS (HCC) WRITTEN ON 02/09/2023  4:35 PM BY Shary Lamos, MD  Transition to the [facility] sensor.  Dr. Koval spent a long time in the room with the patient trying to put on the Dexcom sensor.  She is returning to Dr. Koval at the next week for further assistance connecting the sensor to her phone.  On statin, obtain urine microalbumin today.  Will discussed eye and foot exam on next visit in 3 months.  Follow-up with Dr. Koval in the meantime.

## 2023-02-10 LAB — MICROALBUMIN / CREATININE URINE RATIO
Creatinine, Urine: 15.9 mg/dL
Microalb/Creat Ratio: 38 mg/g{creat} — ABNORMAL HIGH (ref 0–29)
Microalbumin, Urine: 6 ug/mL

## 2023-02-10 NOTE — Progress Notes (Signed)
Reviewed and agree with Dr Koval's plan.   

## 2023-02-17 ENCOUNTER — Encounter: Payer: Self-pay | Admitting: Student

## 2023-02-17 ENCOUNTER — Ambulatory Visit: Payer: Medicare HMO | Admitting: Pharmacist

## 2023-02-19 ENCOUNTER — Telehealth: Payer: Self-pay

## 2023-02-19 ENCOUNTER — Other Ambulatory Visit (HOSPITAL_COMMUNITY): Payer: Self-pay

## 2023-02-19 ENCOUNTER — Telehealth: Payer: Self-pay | Admitting: Family Medicine

## 2023-02-19 ENCOUNTER — Ambulatory Visit: Payer: Medicare HMO | Admitting: Pharmacist

## 2023-02-19 NOTE — Telephone Encounter (Signed)
Patient came in saying that she went to pick up the monitor, but they are still waiting for doctor's authorization. She cancelled her appt for today, asks for you to give her a call please.

## 2023-02-19 NOTE — Telephone Encounter (Signed)
Attempted to contact patient for follow-up of rescheduled appointment due to lack of PA completion.  I called her pharmacy and they shared that they do not have a PA completed for her.  I asked Deborah [name], CPhT to follow-up and attempt to facilitate completion.    Left HIPAA compliant voice mail stating I am aware of the issue, have requested help and apologize for the wait.  I asked her to reschedule at her earliest convenience once the CGM receiver is available.   Total time with patient call and documentation of interaction: .  Follow-up phone call planned:  None

## 2023-02-19 NOTE — Telephone Encounter (Signed)
Pharmacy Patient Advocate Encounter   Received notification from CoverMyMeds that prior authorization for [facility] G7 RECEIVER is required/requested.   Insurance verification completed.   The patient is insured through [employer] [facility] .   Per test claim: CANCELLED due to [employer] is not able to process this request through ePA, please contact the plan at [phone] or fax in request to [phone].  Will call to process PA

## 2023-02-20 ENCOUNTER — Other Ambulatory Visit (HOSPITAL_COMMUNITY): Payer: Self-pay

## 2023-02-20 NOTE — Telephone Encounter (Signed)
Reviewed and agree with Dr Koval's plan.   

## 2023-02-20 NOTE — Telephone Encounter (Signed)
Spoke with insurance to process PA  Prior auth approved 02/20/23 - 06/16/23  PA# [phone]

## 2023-02-25 ENCOUNTER — Ambulatory Visit (INDEPENDENT_AMBULATORY_CARE_PROVIDER_SITE_OTHER): Payer: Medicare HMO | Admitting: Pharmacist

## 2023-02-25 ENCOUNTER — Encounter: Payer: Self-pay | Admitting: Pharmacist

## 2023-02-25 VITALS — BP 171/87 | HR 75 | Ht 68.0 in | Wt 219.2 lb

## 2023-02-25 DIAGNOSIS — E109 Type 1 diabetes mellitus without complications: Secondary | ICD-10-CM

## 2023-02-25 NOTE — Patient Instructions (Signed)
It was nice to see you today!  Your goal blood sugar is 80-130 before eating and less than 180 after eating.  Medication Changes: Continue all other medication the same.   Enjoy your CGM!  Keep up the good work with diet and exercise. Aim for a diet full of vegetables, fruit and lean meats (chicken, [place], fish). Try to limit salt intake by eating fresh or frozen vegetables (instead of canned), rinse canned vegetables prior to cooking and do not add any additional salt to meals.

## 2023-02-25 NOTE — Assessment & Plan Note (Signed)
Diabetes recently diagnosed in 2024. Patient is able to verbalize appropriate hypoglycemia management plan. Patient appears adherent with medication. Patient was educated on how to use DEXCOM Continuous Glucose Monitor (receiver) and check her blood sugars. Dexcom G7 placed on patient's left arm and paired with receiver successfully. Patient will return in 8 days to review CGM/ discuss additional details and have another sensor placed.

## 2023-02-25 NOTE — Assessment & Plan Note (Signed)
>>  ASSESSMENT AND PLAN FOR KETOSIS-PRONE DIABETES MELLITUS (HCC) WRITTEN ON 02/25/2023 10:19 AM BY Josemaria Brining G, RPH-CPP  Diabetes recently diagnosed in 2024. Patient is able to verbalize appropriate hypoglycemia management plan. Patient appears adherent with medication. Patient was educated on how to use DEXCOM Continuous Glucose Monitor (receiver) and check her blood sugars. Dexcom G7 placed on patient's left arm and paired with receiver successfully. Patient will return in 8 days to review CGM/ discuss additional details and have another sensor placed.

## 2023-02-25 NOTE — Progress Notes (Signed)
S:     Chief Complaint  Patient presents with   Patient Education    Dexcom    Patient arrives in positive spirits. Presents for diabetes management and Dexcom G7 application.  Patient was referred and last seen by Primary Care Provider on 02/09/23.   Patient reports Diabetes was diagnosed in 2024.   Insurance coverage/medication affordability: [employer]  Patient reports adherence with medications.  Current diabetes medications include: metformin 500 mg daily, Lantus (insulin glargine) 21 units daily, Humalog (insulin lispro) Current hypertension medications include: amlodipine 10 mg daily, carvedilol 12.5 mg daily, losartan-hydrochlorothiazide 100-25 mg daily Current hyperlipidemia medications include: rosuvastatin 5 mg daily  Patient denies hypoglycemic events.   Dexcom G7 patient education  Instruction: Patient oriented to three components of Dexcom G7 continuous glucose monitor (sensor, transmitter, receiver/cellphone) Receiver or cellphone: Receiver  CGM overview and set-up  1. Button, touch screen, and icons 2. Power supply and recharging 3. Home screen 4. Date and time 5. Set BG target range: 130-180 6. Set alarm/alert tone   -Urgent low (automatic): yes -Urgent Low Soon: yes -Low Glucose: yes -High Glucose: yes -Rise Rate: yes -Fall Rate: yes -No Readings Alert: yes 7. Interstitial vs. capillary blood glucose readings  8. When to verify sensor reading with fingerstick blood glucose 9. Blood glucose reading measured every five minutes. 10. Sensor will last 10 days 11. Transmitter will last 90 days and must be reused  12. Transmitter must be within 20 feet of receiver  Sensor application -- sensor placed on left arm 1. Site selection and site prep with alcohol pad 2. Sensor prep-sensor pack and sensor applicator 5. Starting the sensor: 21 minute warm up before Glucose readings available 6. Sensor change every 10 days and rotate site 7. Call clinic  if sensor comes off before 10 days  Safety and Troubleshooting 1. Do a fingerstick blood glucose test if the sensor readings do not match how    you feel 2. Remove sensor prior to magnetic resonance imaging (MRI), computed tomography (CT) scan, or high-frequency electrical heat (diathermy) treatment. 3. Do not allow sun screen or insect repellant to come into contact with Dexcom G7. These skin care products may lead for the plastic used in the Dexcom G7 to crack. 4. Dexcom G7 may be worn through a Industrial/product designer. It may not be exposed to an advanced Imaging Technology (AIT) body scanner (also called a millimeter wave scanner) or the baggage x-ray machine. Instead, ask for hand-wanding or full-body pat-down and visual inspection.  7. Store sensor kit between 36 and 86 degrees Farenheit. Can be refrigerated within this temperature range.  O:  Physical Exam Vitals reviewed.  Constitutional:      Appearance: Normal appearance.  Pulmonary:     Effort: Pulmonary effort is normal.  Neurological:     Mental Status: She is alert.  Psychiatric:        Mood and Affect: Mood normal.        Behavior: Behavior normal.        Thought Content: Thought content normal.        Judgment: Judgment normal.      Review of Systems  All other systems reviewed and are negative.    Lab Results  Component Value Date   HGBA1C 6.1 11/24/2022   Vitals:   02/25/23 0927 02/25/23 0930  BP: (!) 176/93 (!) 171/87  Pulse: 75   SpO2: 96%     Lipid Panel     Component  Value Date/Time   CHOL 186 08/24/2017 0939   TRIG 120 08/24/2017 0939   HDL 50 08/24/2017 0939   CHOLHDL 3.7 08/24/2017 0939   CHOLHDL 2.9 03/20/2011 0840   VLDL 15 03/20/2011 0840   LDLCALC 112 (H) 08/24/2017 0939   A/P: Diabetes  Diabetes recently diagnosed in 2024. Patient is able to verbalize appropriate hypoglycemia management plan. Patient appears adherent with medication. Patient was educated on how to use DEXCOM  Continuous Glucose Monitor (receiver) and check her blood sugars. Dexcom G7 placed on patient's left arm and paired with receiver successfully. Patient will return in 8 days to review CGM/ discuss additional details and have another sensor placed.  Written patient instructions provided. Total time in face to face counseling 27 minutes.   Follow up Pharmacist/PCP in 8 days  Clinic Visit in PRN.    Patient seen with Alesia Banda, PharmD Candidate and Andee Poles, PharmD Candidate.

## 2023-02-26 ENCOUNTER — Ambulatory Visit: Payer: Medicare HMO

## 2023-02-27 NOTE — Progress Notes (Signed)
Reviewed and agree with Dr Koval's plan.   

## 2023-03-05 ENCOUNTER — Ambulatory Visit (INDEPENDENT_AMBULATORY_CARE_PROVIDER_SITE_OTHER): Payer: Medicare HMO | Admitting: Pharmacist

## 2023-03-05 ENCOUNTER — Encounter: Payer: Self-pay | Admitting: Pharmacist

## 2023-03-05 VITALS — BP 122/62 | HR 77 | Wt 222.0 lb

## 2023-03-05 DIAGNOSIS — F339 Major depressive disorder, recurrent, unspecified: Secondary | ICD-10-CM

## 2023-03-05 DIAGNOSIS — I152 Hypertension secondary to endocrine disorders: Secondary | ICD-10-CM | POA: Diagnosis not present

## 2023-03-05 DIAGNOSIS — Z794 Long term (current) use of insulin: Secondary | ICD-10-CM

## 2023-03-05 DIAGNOSIS — E109 Type 1 diabetes mellitus without complications: Secondary | ICD-10-CM

## 2023-03-05 DIAGNOSIS — E1159 Type 2 diabetes mellitus with other circulatory complications: Secondary | ICD-10-CM

## 2023-03-05 DIAGNOSIS — Z23 Encounter for immunization: Secondary | ICD-10-CM | POA: Diagnosis not present

## 2023-03-05 MED ORDER — RSV PRE-FUSION F A&B VAC RCMB 120 MCG/0.5ML IM SOLR: 0.5000 mL | Freq: Once | INTRAMUSCULAR | 0 refills | Status: AC

## 2023-03-05 MED ORDER — ESCITALOPRAM OXALATE 20 MG PO TABS
20.0000 mg | ORAL_TABLET | Freq: Every day | ORAL | 1 refills | Status: DC
Start: 2023-03-05 — End: 2024-02-28

## 2023-03-05 MED ORDER — INSULIN GLARGINE 100 UNIT/ML SOLOSTAR PEN: 24.0000 [IU] | PEN_INJECTOR | Freq: Every day | SUBCUTANEOUS | 1 refills | Status: DC

## 2023-03-05 NOTE — Progress Notes (Signed)
S:     Chief Complaint  Patient presents with   Medication Management    Diabetes   67 y.o. female who presents for diabetes evaluation, education, and management. Patient arrives in good spirits and presents without any assistance.  Patient was referred and last seen by Primary Care Provider, Dr. Jena Gauss, on 02/09/2023. Last seen by pharmacy clinic on 02/25/2023 and placed patient's Dexcom sensor and connected with receiver.    PMH is significant for T2DM, HTN, HLD, MDD.   Patient reports Diabetes was diagnosed in 2024.   Current diabetes medications include: Lantus (insulin glargine) 24 units daily, Humalog (insulin lispro) 7 units TID with meals, metformin 500 mg daily Current hypertension medications include: amlodipine 10 mg daily, carvedilol 12.5 mg BID, losartan-hydrochlorothiazide 100-25 mg daily,  Current hyperlipidemia medications include: rosuvastatin 5 mg daily  Patient reports adherence to taking all medications as prescribed.   Patient reports hypoglycemic events when she ate bananas without peanut butter.  Patient denies nocturia (nighttime urination).  Patient denies neuropathy (nerve pain). Patient denies visual changes. Patient reports self foot exams.   O:   Review of Systems  All other systems reviewed and are negative.   Physical Exam Constitutional:      Appearance: Normal appearance.  Neurological:     Mental Status: She is alert.  Psychiatric:        Mood and Affect: Mood normal.        Behavior: Behavior normal.        Thought Content: Thought content normal.        Judgment: Judgment normal.    Dexcom CGM Download today September 5 - September 18 % Time CGM is active: 99.2% Average Glucose: 117 mg/dL Glucose Management Indicator: 6.1%  Glucose Variability: 20.7% (goal <36%) Time in Goal:  - Time in range 70-180: 98% - Time above range: 1% - Time below range: 1%  Lab Results  Component Value Date   HGBA1C 6.1 11/24/2022    Vitals:   03/05/23 0940  BP: 122/62  Pulse: 77  SpO2: 95%    Lipid Panel     Component Value Date/Time   CHOL 186 08/24/2017 0939   TRIG 120 08/24/2017 0939   HDL 50 08/24/2017 0939   CHOLHDL 3.7 08/24/2017 0939   CHOLHDL 2.9 03/20/2011 0840   VLDL 15 03/20/2011 0840   LDLCALC 112 (H) 08/24/2017 0939    Clinical Atherosclerotic Cardiovascular Disease (ASCVD): No  The ASCVD Risk score (Arnett DK, et al., 2019) failed to calculate for the following reasons:   Cannot find a previous HDL lab   Cannot find a previous total cholesterol lab   A/P: Diabetes recently diagnosed, with improvement in GMI and average blood sugar. Patient is able to verbalize appropriate hypoglycemia management plan. Medication adherence appears optimal. Patient is happy with dexcom sensor and placed another one on today. -Continued basal insulin Lantus (insulin glargine) 24 units daily -Continued rapid insulin Humalog (insulin lispro) 7 units TID with meals -Continued metformin 500 mg daily.  -Patient educated on purpose, proper use, and potential adverse effects.  -Extensively discussed pathophysiology of diabetes, recommended lifestyle interventions, dietary effects on blood sugar control.  -Counseled on s/sx of and management of hypoglycemia.  -A1c at PCP visit.    ASCVD risk - primary prevention in patient with diabetes. Last LDL is 112 not at goal of <46 mg/dL. ASCVD risk factors include T2DM. high intensity statin indicated. Will discuss increasing medication dose at subsequent visit. -Continued rosuvastatin 5 mg.  -  Consider dose increase of rosuvastatin at next visit.   Hypertension longstanding currently controlled based on in office BP. Blood pressure goal of <130/80 mmHg. Medication adherence optimal. Continued amlodipine 10 mg daily, carvedilol 12.5 mg BID, losartan-hydrochlorothiazide 100-25 mg daily  Immunizations - Patient wanted to be up to date on her vaccines to avoid getting sick.  Nurse provided patient with influenza and COVID shot. - Sent prescription to pharmacy for RSV vaccine, which she reports will get today.  Refill - Patient requested refill for Lexapro (escitalopram) 20 mg daily - sent to the pharmacy. Patient reports mood is doing well.  Written patient instructions provided. Patient verbalized understanding of treatment plan.  Total time in face to face counseling 28 minutes.    Follow-up:  Pharmacist PRN PCP clinic visit in 1 week (9/26) Patient seen with Alesia Banda, PharmD Candidate.

## 2023-03-05 NOTE — Assessment & Plan Note (Signed)
Hypertension longstanding currently controlled based on in office BP. Blood pressure goal of <130/80 mmHg. Medication adherence optimal. Continued amlodipine 10 mg daily, carvedilol 12.5 mg BID, losartan-hydrochlorothiazide 100-25 mg daily

## 2023-03-05 NOTE — Assessment & Plan Note (Signed)
>>  ASSESSMENT AND PLAN FOR KETOSIS-PRONE DIABETES MELLITUS (HCC) WRITTEN ON 03/05/2023 12:29 PM BY Azaliah Carrero G, RPH-CPP  Diabetes recently diagnosed, with improvement in GMI and average blood sugar. Patient is able to verbalize appropriate hypoglycemia management plan. Medication adherence appears optimal. Patient is happy with dexcom sensor and placed another one on today. -Continued basal insulin  Lantus  (insulin  glargine) 24 units daily -Continued rapid insulin  Humalog  (insulin  lispro) 7 units TID with meals -Continued metformin  500 mg daily.  -Patient educated on purpose, proper use, and potential adverse effects.  -Extensively discussed pathophysiology of diabetes, recommended lifestyle interventions, dietary effects on blood sugar control.  -Counseled on s/sx of and management of hypoglycemia.  -

## 2023-03-05 NOTE — Assessment & Plan Note (Signed)
Diabetes recently diagnosed, with improvement in GMI and average blood sugar. Patient is able to verbalize appropriate hypoglycemia management plan. Medication adherence appears optimal. Patient is happy with dexcom sensor and placed another one on today. -Continued basal insulin Lantus (insulin glargine) 24 units daily -Continued rapid insulin Humalog (insulin lispro) 7 units TID with meals -Continued metformin 500 mg daily.  -Patient educated on purpose, proper use, and potential adverse effects.  -Extensively discussed pathophysiology of diabetes, recommended lifestyle interventions, dietary effects on blood sugar control.  -Counseled on s/sx of and management of hypoglycemia.  -

## 2023-03-05 NOTE — Progress Notes (Signed)
Reviewed and agree with Dr Koval's plan.   

## 2023-03-05 NOTE — Patient Instructions (Signed)
It was nice to see you today!  Your goal blood sugar is 80-130 before eating and less than 180 after eating.  Medication Changes: Sent prescriptions for the RSV vaccine and escitalopram to the pharmacy.  Continue all other medication the same.   Keep up the good work with diet and exercise. Aim for a diet full of vegetables, fruit and lean meats (chicken, [place], fish). Try to limit salt intake by eating fresh or frozen vegetables (instead of canned), rinse canned vegetables prior to cooking and do not add any additional salt to meals.

## 2023-03-09 ENCOUNTER — Ambulatory Visit: Payer: Medicare HMO

## 2023-03-11 ENCOUNTER — Ambulatory Visit: Payer: Medicare HMO | Admitting: Pulmonary Disease

## 2023-03-12 ENCOUNTER — Ambulatory Visit: Payer: Medicare HMO | Admitting: Family Medicine

## 2023-03-16 ENCOUNTER — Encounter: Payer: Self-pay | Admitting: Family Medicine

## 2023-03-16 ENCOUNTER — Ambulatory Visit: Payer: Medicare HMO | Admitting: Family Medicine

## 2023-03-16 ENCOUNTER — Other Ambulatory Visit: Payer: Self-pay

## 2023-03-16 VITALS — BP 127/89 | HR 87 | Ht 68.0 in | Wt 222.1 lb

## 2023-03-16 DIAGNOSIS — I152 Hypertension secondary to endocrine disorders: Secondary | ICD-10-CM | POA: Diagnosis not present

## 2023-03-16 DIAGNOSIS — E039 Hypothyroidism, unspecified: Secondary | ICD-10-CM

## 2023-03-16 DIAGNOSIS — E109 Type 1 diabetes mellitus without complications: Secondary | ICD-10-CM

## 2023-03-16 DIAGNOSIS — Z1211 Encounter for screening for malignant neoplasm of colon: Secondary | ICD-10-CM | POA: Diagnosis not present

## 2023-03-16 DIAGNOSIS — Z794 Long term (current) use of insulin: Secondary | ICD-10-CM | POA: Diagnosis not present

## 2023-03-16 DIAGNOSIS — E1159 Type 2 diabetes mellitus with other circulatory complications: Secondary | ICD-10-CM

## 2023-03-16 MED ORDER — CETIRIZINE HCL 10 MG PO TABS: 10.0000 mg | ORAL_TABLET | Freq: Every day | ORAL | 1 refills | Status: DC

## 2023-03-16 NOTE — Assessment & Plan Note (Signed)
Checked TSH, T4 and t3 today, will adjust medication as necessary based on results.

## 2023-03-16 NOTE — Assessment & Plan Note (Signed)
Patient is currently well-managed on regimen instituted by Dr. Raymondo Band.  Her last A1c was completed on June 10, so we will recheck today. If it remains unchanged from last time, we could move to checking once every 6 months.

## 2023-03-16 NOTE — Progress Notes (Signed)
    SUBJECTIVE:   CHIEF COMPLAINT / HPI:   Here for diabetes management. Seen in Pharmacy clinic on 9/19 and is doing well with current regimen. LDL not at goal, Dr. Raymondo Band recommended increasing rosuvastatin after next recheck.  She had her diabetic eye exam and foot exam done earlier this year.  She has no other complaints or concerns today other than needing some routine labs drawn to monitor her hypertension diabetes and thyroid and needing a referral for her colonoscopy.  PERTINENT  PMH / PSH: HTN, diabetes, hypothyroid, s/p tracheostomy for laryngeal cancer, congestive HF, COPD  OBJECTIVE:   BP 127/89   Pulse 87   Ht 5\' 8"  (1.727 m)   Wt 222 lb 2 oz (100.8 kg)   SpO2 92%   BMI 33.77 kg/m   General: A&O, NAD Cardiac: RRR, no m/r/g Respiratory: CTAB, normal WOB, no w/c/r GI: Soft, NTTP, non-distended  Extremities: NTTP, no peripheral edema.  ASSESSMENT/PLAN:   Hypertension associated with diabetes (HCC) BP in office was mildly elevated at 141/85. She follows closely with Dr. Raymondo Band for medication management. No chest pain, dyspnea or palpitations. We will check a CMP today for electrolyte disturbances as well as check a lipid panel as the patient would be a good candidate for high intensity statin therapy if her LDL remains >70  Hypothyroidism Checked TSH, T4 and t3 today, will adjust medication as necessary based on results.   Ketosis-prone diabetes mellitus (HCC) Patient is currently well-managed on regimen instituted by Dr. Raymondo Band.  Her last A1c was completed on June 10, so we will recheck today. If it remains unchanged from last time, we could move to checking once every 6 months.   Gerrit Heck, DO [facility]

## 2023-03-16 NOTE — Assessment & Plan Note (Signed)
>>  ASSESSMENT AND PLAN FOR KETOSIS-PRONE DIABETES MELLITUS (HCC) WRITTEN ON 03/16/2023  3:04 PM BY Maeley Matton, DO  Patient is currently well-managed on regimen instituted by Dr. Koval.  Her last A1c was completed on June 10, so we will recheck today. If it remains unchanged from last time, we could move to checking once every 6 months.

## 2023-03-16 NOTE — Patient Instructions (Signed)
It was wonderful to see you today!  Today we discussed your chronic conditions including your high blood pressure, diabetes, hypothyroidism, and hyperlipidemia.  We drew some labs to monitor your thyroid and to check on your electrolytes and other levels related to your high blood pressure.  You are doing really well with your diabetes regimen so we will make no changes today.  Please continue to follow with Dr. Raymondo Band in our pharmacy clinic for ongoing medication management.  I also ordered your colonoscopy today.  You should hear from the GI office soon to work on getting that scheduled.  If you have not heard from them by the end of the month, please give me a call.  Please call [phone] with any questions about today's appointment.   If you need any additional refills, please call your pharmacy before calling the office.  Gerrit Heck, DO Family Medicine

## 2023-03-16 NOTE — Assessment & Plan Note (Signed)
BP in office was mildly elevated at 141/85. She follows closely with Dr. Raymondo Band for medication management. No chest pain, dyspnea or palpitations. We will check a CMP today for electrolyte disturbances as well as check a lipid panel as the patient would be a good candidate for high intensity statin therapy if her LDL remains >70

## 2023-03-17 LAB — COMPREHENSIVE METABOLIC PANEL
ALT: 60 [IU]/L — ABNORMAL HIGH (ref 0–32)
AST: 67 [IU]/L — ABNORMAL HIGH (ref 0–40)
Albumin: 4.6 g/dL (ref 3.9–4.9)
Alkaline Phosphatase: 136 [IU]/L — ABNORMAL HIGH (ref 44–121)
BUN/Creatinine Ratio: 29 — ABNORMAL HIGH (ref 12–28)
BUN: 21 mg/dL (ref 8–27)
Bilirubin Total: 0.3 mg/dL (ref 0.0–1.2)
CO2: 18 mmol/L — ABNORMAL LOW (ref 20–29)
Calcium: 9.8 mg/dL (ref 8.7–10.3)
Chloride: 104 mmol/L (ref 96–106)
Creatinine, Ser: 0.73 mg/dL (ref 0.57–1.00)
Globulin, Total: 3.7 g/dL (ref 1.5–4.5)
Glucose: 101 mg/dL — ABNORMAL HIGH (ref 70–99)
Potassium: 4.5 mmol/L (ref 3.5–5.2)
Sodium: 141 mmol/L (ref 134–144)
Total Protein: 8.3 g/dL (ref 6.0–8.5)
eGFR: 91 mL/min/{1.73_m2} (ref >59)

## 2023-03-17 LAB — T3, FREE: T3, Free: 3.8 pg/mL (ref 2.0–4.4)

## 2023-03-17 LAB — TSH RFX ON ABNORMAL TO FREE T4: TSH: 0.167 u[IU]/mL — ABNORMAL LOW (ref 0.450–4.500)

## 2023-03-17 LAB — LIPID PANEL
Chol/HDL Ratio: 3.9 {ratio} (ref 0.0–4.4)
Cholesterol, Total: 225 mg/dL — ABNORMAL HIGH (ref 100–199)
HDL: 57 mg/dL (ref >39)
LDL Chol Calc (NIH): 121 mg/dL — ABNORMAL HIGH (ref 0–99)
Triglycerides: 267 mg/dL — ABNORMAL HIGH (ref 0–149)
VLDL Cholesterol Cal: 47 mg/dL — ABNORMAL HIGH (ref 5–40)

## 2023-03-17 LAB — T4F: T4,Free (Direct): 1.89 ng/dL — ABNORMAL HIGH (ref 0.82–1.77)

## 2023-03-18 ENCOUNTER — Other Ambulatory Visit: Payer: Self-pay

## 2023-03-18 DIAGNOSIS — J4540 Moderate persistent asthma, uncomplicated: Secondary | ICD-10-CM

## 2023-03-18 MED ORDER — MONTELUKAST SODIUM 10 MG PO TABS
10.0000 mg | ORAL_TABLET | Freq: Every day | ORAL | 1 refills | Status: DC
Start: 2023-03-18 — End: 2023-09-11

## 2023-03-19 ENCOUNTER — Telehealth: Payer: Self-pay

## 2023-03-19 DIAGNOSIS — E039 Hypothyroidism, unspecified: Secondary | ICD-10-CM

## 2023-03-19 DIAGNOSIS — E109 Type 1 diabetes mellitus without complications: Secondary | ICD-10-CM

## 2023-03-19 NOTE — Telephone Encounter (Signed)
Patient calls nurse line stating that she missed a call from our office.   Advised patient that I was unable to see where anyone from our office was trying to reach her.   She reports that she had labs drawn a few days ago and was unsure if the call was related to this.   Will forward to PCP regarding labs.   Please return call to patient at [phone].   Veronda Prude, RN

## 2023-03-19 NOTE — Addendum Note (Signed)
Addended by: Latrelle Dodrill on: 03/19/2023 10:44 AM   Modules accepted: Level of Service

## 2023-03-20 ENCOUNTER — Other Ambulatory Visit: Payer: Self-pay | Admitting: Family Medicine

## 2023-03-20 DIAGNOSIS — E109 Type 1 diabetes mellitus without complications: Secondary | ICD-10-CM

## 2023-03-20 NOTE — Telephone Encounter (Signed)
Patient calls nurse line again reporting she missed a call from our office.   If someone was calling her to discuss results, she reports you can leave a detailed message on her VM.   Will forward to PCP.

## 2023-03-23 MED ORDER — ROSUVASTATIN CALCIUM 40 MG PO TABS: 40.0000 mg | ORAL_TABLET | Freq: Every day | ORAL | 3 refills | Status: DC

## 2023-03-23 MED ORDER — LEVOTHYROXINE SODIUM 137 MCG PO TABS: 137.0000 ug | ORAL_TABLET | Freq: Every day | ORAL | 0 refills | Status: DC

## 2023-03-23 NOTE — Telephone Encounter (Signed)
Called patient and left HIPAA compliant voice message advising her to check her MyChart for details on the results of her labs as well as medication instructions.  For documentation purposes: decreased patient's dose of levothyroxine from 150 mg to 137 mg to be taken daily with breakfast, and increased patient's dose of rosuvastatin from 5 mg to 40 mg for high intensity statin treatment of her diabetes associated hyperlipidemia.  Discussed this course of action with Dr. Madelon Lips prior to sending in medications and he is in agreement with this plan.

## 2023-05-18 ENCOUNTER — Encounter: Payer: Self-pay | Admitting: Gastroenterology

## 2023-05-20 ENCOUNTER — Telehealth (HOSPITAL_COMMUNITY): Payer: Self-pay | Admitting: Otolaryngology

## 2023-05-20 NOTE — Telephone Encounter (Signed)
Attempted to reach pt to schedule OP MBS, left VM with details and request for callback. AHARRIS

## 2023-05-21 ENCOUNTER — Ambulatory Visit: Payer: Medicare HMO

## 2023-05-21 ENCOUNTER — Encounter: Payer: Self-pay | Admitting: Pulmonary Disease

## 2023-05-21 ENCOUNTER — Ambulatory Visit: Payer: Medicare HMO | Admitting: Pulmonary Disease

## 2023-05-21 VITALS — BP 147/88 | HR 95 | Ht 68.0 in | Wt 229.0 lb

## 2023-05-21 DIAGNOSIS — Z930 Tracheostomy status: Secondary | ICD-10-CM

## 2023-05-21 DIAGNOSIS — R635 Abnormal weight gain: Secondary | ICD-10-CM | POA: Diagnosis not present

## 2023-05-21 DIAGNOSIS — J4540 Moderate persistent asthma, uncomplicated: Secondary | ICD-10-CM

## 2023-05-21 DIAGNOSIS — R0602 Shortness of breath: Secondary | ICD-10-CM | POA: Diagnosis not present

## 2023-05-21 MED ORDER — AMOXICILLIN-POT CLAVULANATE 875-125 MG PO TABS: 1.0000 | ORAL_TABLET | Freq: Two times a day (BID) | ORAL | 0 refills | Status: DC

## 2023-05-21 MED ORDER — IPRATROPIUM-ALBUTEROL 0.5-2.5 (3) MG/3ML IN SOLN
3.0000 mL | Freq: Four times a day (QID) | RESPIRATORY_TRACT | 3 refills | Status: DC
Start: 2023-05-21 — End: 2023-05-22

## 2023-05-21 MED ORDER — PREDNISONE 10 MG PO TABS: ORAL_TABLET | ORAL | 0 refills | Status: AC

## 2023-05-21 MED ORDER — SODIUM CHLORIDE 3 % IN NEBU: INHALATION_SOLUTION | Freq: Three times a day (TID) | RESPIRATORY_TRACT | 5 refills | Status: DC

## 2023-05-21 NOTE — Patient Instructions (Addendum)
Start prednisone taper  Start augmentin twice daily   Continue your nebulizer treatments  Duonebs every 6 hours  Budesonide neb twice daily  Hypertonic nebulizer treatment 2-3 times per day  We will check a chest x-ray and labs today. I will message you with the results and consider starting lasix for a few days to help with the quick weight gain which is likely fluid retention.  We will likely need to get a CT Chest scan for further evaluation  Follow up in 3 months

## 2023-05-21 NOTE — Progress Notes (Signed)
Synopsis: Referred in December 2022 for Foreign Body in Bronchus by Dr. 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   She presents with increased coughing and fatigue. They report difficulty getting up, describing it as a struggle that takes about fifteen to twenty minutes. The patient is currently on DuoNeb nebulizer solution, budesonide nebulizer, and hypertonic saline nebulizers, but reports that these treatments are not alleviating their symptoms. They also mention taking Mucinex, but do not feel it is working. The patient is not currently on any antibiotics.  In addition to these symptoms, the patient reports a rapid weight gain of about ten pounds in a week. They have noticed swelling in their knees and feet, which they initially attributed to their diabetes. The patient also mentions having a recent episode of breathlessness while sitting down, which was alarming for them as it was the first time they experienced such an episode.  The patient also reports issues with swallowing and a prosthesis. They mention that they have been unable to swallow meat and have been waiting for a scheduled swallowing test since February. They also report frequent issues with their prosthesis, including holes forming due to coughing and the prosthesis coming out.  OV 05/21/23 She has increased cough and shortness of breath and trouble clearing mucous secretions due to fistula closure following TEP prosthesis expulsion in late January. She is scheduled with Dr. [name] 2/26 for replacement of TEP prosthesis. She continues on nebulizer regimen but is having much  harder time clearing her secretions with out prosthesis in place. She does report coughing up some blood. Denies fevers, chills or sweats.   OV 01/21/22 She has been doing well since last visit and moving to an all nebulizer regimen with budesonide and duonebs. She continues to have thick mucous plugs at times though on a daily basis. She is also using hypertonic nebs twice daily.   OV 09/17/21 She went to the ER on 3/16 with chest pains that were worse with movements. She had CTA Chest done which was negative for PE. She had diffuse mosaic attenuation and persistent collapse of the left lower lobe.   She reports her allergies are acting up and she has been having more wheezing recently. She denies increased mucous production at this time.   OV 07/16/21 She called to be seen sooner due to coughing spells of recent that wake her from her sleep. She has significant coughing spell that will lead to shortness of breath and difficulty clearing mucous. She denies fevers, chills or sweats.  She was started on flonase nasal spray and ipratropium nasal spray at last visit with some improvement in her sinus congestion but she continues to have a lot of mucous. She is using dulera and spiriva inhalers along with duoneb nebulizer treatments.   The mucous is clear to whitish with occasional blood tinged streaks.   Chest x-ray from 05/17/21 shows persistent left lower lobe atelectasis.   OV 05/17/21 Patient seen by her ENT, Dr. [name] with CT scan on 11/17 showing obstructing foreign body in the left mainstem bronchus with total post-obstructive atelectasis of lower lung. Appointment setup for  today for further evaluation but she became symptomatic and presented to the ED on 11/26. She underwent bronchoscopy with foreign body retrieval with Dr. Tonia Brooms on 11/26. 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 She recovered well post-procedure and was then sent home from the ED.   She was seen yesterday by Dr.  [name] with reported improvement in her breathing but continuing to have cough with mucous production.   She has been doing well since her hospital visit.  She continues to have cough with sputum production.  She also complains of significant postnasal drainage, more so than normal.  She denies any issues with aspiration.  She is being scheduled for esophagram and dilation.  Past Medical History:  Diagnosis Date   Anemia    h/o of   Anxiety    Arthritis    in knees   Asthma    Breast cancer (HCC)    Bronchitis    Carpal tunnel syndrome 03/16/2018   Chronic pain 01/20/2012   Patient with chronic pain at stoma site. Seen by ENT (Dr. [name]) who recommends pain management. 12/22/11. She is currently on Vicodin and fentanyl.   04/07/13: denies stoma pain. Currently on flexeril, mobic and tramadol for b/l shoulder and neck pains .   COPD (chronic obstructive pulmonary disease) (HCC)    Depression    Diabetes mellitus without complication (HCC)    Diverticulosis 11/15/2012   noted on screening colonoscopy    DOE (dyspnea on exertion) 04/28/2014   Esophageal stricture    FB bronchus    Former smoker 03/19/2011   GERD (gastroesophageal reflux disease)    Heart murmur    asymptomatic    History of laryngectomy    History of radiation therapy 11/15/18- 12/06/18   Right Breast total dose 42.56 Gy in 16 fractions.    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   Leukocytopenia    Nausea alone 07/28/2013   Neck pain 01/21/2012   Normal MRI 07/14/2011   negative for mestasis    Plantar fasciitis 06/06/2016   Pneumonia 2012   RUQ abdominal pain 08/08/2022   On exam patient with marked tenderness to  percussion and palpation RUQ. Obesity hinder palpation  PLan   Sciatica    Seizure (HCC) 07/17/2011   Seizures (HCC)    07/24/11 off Effexor w/o seizure   Sepsis (HCC) 08/04/2012   Sinusitis, chronic 07/20/2011   Bilateral maxillary, identified on MRI of head 07/14/11.     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HCC)      Family History  Problem Relation Age of Onset   Heart disease Mother    Heart disease Father    Heart disease Sister    Cancer Brother        type unknown   Liver disease Maternal Grandmother    Cancer Sister        type unknown   Diabetes Sister    Breast cancer Neg Hx      Social History   Socioeconomic History   Marital status: Married    Spouse name: Not on file   Number of children: 2   Years of education: 12   Highest education level: Not on file  Occupational  History   Occupation: part time    Comment: warehouse work  Tobacco Use   Smoking status: Former    Current packs/day: 0.00    Average packs/day: 2.0 packs/day for 20.0 years (40.0 ttl pk-yrs)    Types: Cigarettes    Start date: 09/17/1990    Quit date: 09/17/2010    Years since quitting: 12.6   Smokeless tobacco: Never  Vaping Use   Vaping status: Never Used  Substance and Sexual Activity   Alcohol use: Yes    Comment: Socially drinks    Drug use: No    Comment: tried cocaine 1 time 2010 only used 1 time   Sexual activity: Yes    Birth control/protection: Surgical  Other Topics Concern   Not on file  Social History Narrative   Lives with boyfriend   Social Determinants of Health   Financial Resource Strain: Not on file  Food Insecurity: Low Risk  (08/19/2022)   [facility]   Hunger Vital Sign    Worried About Running Out of Food in the Last Year: Never true    Ran Out of Food in the Last Year: Never true  Transportation Needs: No Transportation Needs (08/19/2022)   [facility]   Transportation    In the past 12 months, has lack of  reliable transportation kept you from medical appointments, meetings, work or from getting things needed for daily living? : No  Physical Activity: Not on file  Stress: Not on file  Social Connections: Not on file  Intimate Partner Violence: Not At Risk (08/09/2022)   Humiliation, Afraid, Rape, and Kick questionnaire    Fear of Current or Ex-Partner: No    Emotionally Abused: No    Physically Abused: No    Sexually Abused: No     Allergies  Allergen Reactions   Lisinopril Cough     Outpatient Medications Prior to Visit  Medication Sig Dispense Refill   albuterol (PROVENTIL) (5 MG/ML) 0.5% nebulizer solution Take 0.5 mLs (2.5 mg total) by nebulization every 6 (six) hours as needed for wheezing or shortness of breath. Please give the patient 10 20ml vials. 200 mL 5   amLODipine (NORVASC) 10 MG tablet Take 1 tablet (10 mg total) by mouth daily. 90 tablet 1   amoxicillin-clavulanate (AUGMENTIN) 875-125 MG tablet Take 1 tablet by mouth 2 (two) times daily. 20 tablet 0   anastrozole (ARIMIDEX) 1 MG tablet Take 1 tablet (1 mg total) by mouth daily. 90 tablet 1   benzonatate (TESSALON) 200 MG capsule TAKE 1 CAPSULE BY MOUTH TWICE DAILY AS NEEDED FOR COUGH 20 capsule 0   Blood Glucose Monitoring Suppl (ACCU-CHEK GUIDE) w/Device KIT Use in the morning, noon, and at bedtime to check blood glucose. 1 kit 0   budesonide (PULMICORT) 0.5 MG/2ML nebulizer solution TAKE 2 ML (0.5 MG TOTAL) BY NEBULIZATION TWICE A DAY 120 mL 11   carvedilol (COREG) 12.5 MG tablet Take 1 tablet (12.5 mg total) by mouth 2 (two) times daily with a meal. 180 tablet 3   cetirizine (EQ ALLERGY RELIEF, CETIRIZINE,) 10 MG tablet Take 1 tablet (10 mg total) by mouth daily. 90 tablet 1   Continuous Glucose Receiver (DEXCOM G7 RECEIVER) DEVI USE AS DIRECTED 1 each 0   Continuous Glucose Sensor (DEXCOM G7 SENSOR) MISC 1 Device by Does not apply route as directed. Apply new sensor every 10 days 3 each 11   escitalopram (LEXAPRO) 20 MG  tablet Take  1 tablet (20 mg total) by mouth daily. 90 tablet 1   fluticasone (FLONASE) 50 MCG/ACT nasal spray Place 1 spray into both nostrils daily. 16 g 4   guaiFENesin-codeine 100-10 MG/5ML syrup Take 10 mLs by mouth every 6 (six) hours as needed for cough. 120 mL 0   Humidifiers MISC 1 application by Tracheal Tube route as needed. 1 each 0   insulin glargine (LANTUS) 100 UNIT/ML Solostar Pen Inject 24 Units into the skin daily. 15 mL 1   insulin lispro (HUMALOG KWIKPEN) 100 UNIT/ML KwikPen Inject 7 Units into the skin with breakfast, with lunch, and with evening meal. 15 mL 3   Insulin Pen Needle (BD PEN NEEDLE NANO 2ND GEN) 32G X 4 MM MISC USE AS DIRECTED FOUR TIMES DAILY WITH INSULIN PEN. 100 each 2   ipratropium (ATROVENT) 0.03 % nasal spray USE 2 SPRAY(S) IN EACH NOSTRIL EVERY 12 HOURS 30 mL 5   ipratropium-albuterol (DUONEB) 0.5-2.5 (3) MG/3ML SOLN Take 3 mLs by nebulization every 6 (six) hours. 360 mL 3   levothyroxine (SYNTHROID) 137 MCG tablet Take 1 tablet (137 mcg total) by mouth daily before breakfast. 30 tablet 0   losartan-hydrochlorothiazide (HYZAAR) 100-25 MG tablet Take 1 tablet by mouth daily. 90 tablet 1   metFORMIN (GLUCOPHAGE-XR) 500 MG 24 hr tablet Take 1 tablet by mouth once daily with breakfast 30 tablet 0   montelukast (SINGULAIR) 10 MG tablet Take 1 tablet (10 mg total) by mouth at bedtime. 90 tablet 1   omeprazole (PRILOSEC) 40 MG capsule Take 1 capsule (40 mg total) by mouth 2 (two) times daily. 180 capsule 1   rosuvastatin (CRESTOR) 40 MG tablet Take 1 tablet (40 mg total) by mouth daily. 30 tablet 3   sodium chloride HYPERTONIC 3 % nebulizer solution Take by nebulization 3 (three) times daily. (Patient taking differently: Take 4 mLs by nebulization as needed for cough.) 120 mL 5   No facility-administered medications prior to visit.   Review of Systems  Constitutional:  Negative for chills, fever, malaise/fatigue and weight loss.  HENT:  Negative for congestion,  sinus pain and sore throat.   Eyes: Negative.   Respiratory:  Positive for cough, sputum production and shortness of breath. Negative for hemoptysis and wheezing.   Cardiovascular:  Negative for chest pain, palpitations, orthopnea, claudication and leg swelling.  Gastrointestinal:  Negative for abdominal pain, heartburn, nausea and vomiting.  Genitourinary: Negative.   Musculoskeletal:  Negative for joint pain and myalgias.  Skin:  Negative for rash.  Neurological:  Negative for weakness.  Endo/Heme/Allergies: Negative.   Psychiatric/Behavioral: Negative.      Objective:   Vitals:   05/21/23 1433  BP: (!) 147/88  Pulse: 95  SpO2: 93%  Weight: 229 lb (103.9 kg)  Height: 5\' 8"  (1.727 m)     Physical Exam Constitutional:      General: She is not in acute distress.    Appearance: She is not ill-appearing.  HENT:     Head: Normocephalic and atraumatic.     Mouth/Throat:     Comments: Tracheostomy in place, no erythema or secretions noted Cardiovascular:     Rate and Rhythm: Normal rate and regular rhythm.     Pulses: Normal pulses.     Heart sounds: Normal heart sounds. No murmur heard. Pulmonary:     Effort: Pulmonary effort is normal.     Breath sounds: Decreased air movement present. No wheezing, rhonchi or rales.  Musculoskeletal:     Right lower leg:  No edema.     Left lower leg: No edema.  Skin:    General: Skin is warm and dry.  Neurological:     General: No focal deficit present.     Mental Status: She is alert.     CBC    Component Value Date/Time   WBC 6.9 08/14/2022 1209   WBC 5.9 08/12/2022 0354   RBC 4.36 08/14/2022 1209   RBC 4.30 08/12/2022 0354   HGB 12.8 08/14/2022 1209   HGB 11.9 08/20/2015 1200   HCT 38.5 08/14/2022 1209   HCT 37.3 08/20/2015 1200   PLT 395 08/14/2022 1209   MCV 88 08/14/2022 1209   MCV 91.8 08/20/2015 1200   MCH 29.4 08/14/2022 1209   MCH 29.3 08/12/2022 0354   MCHC 33.2 08/14/2022 1209   MCHC 33.1 08/12/2022 0354    RDW 11.9 08/14/2022 1209   RDW 14.4 08/20/2015 1200   LYMPHSABS 2.0 08/12/2022 0354   LYMPHSABS 1.2 08/20/2015 1200   MONOABS 0.7 08/12/2022 0354   MONOABS 0.3 08/20/2015 1200   EOSABS 0.3 08/12/2022 0354   EOSABS 0.2 08/20/2015 1200   BASOSABS 0.0 08/12/2022 0354   BASOSABS 0.1 08/20/2015 1200      Latest Ref Rng & Units 03/16/2023    4:21 PM 08/14/2022   12:09 PM 08/12/2022    3:54 AM  BMP  Glucose 70 - 99 mg/dL 829  562  130   BUN 8 - 27 mg/dL 21  13  14    Creatinine 0.57 - 1.00 mg/dL 8.65  7.84  6.96   BUN/Creat Ratio 12 - 28 29  14     Sodium 134 - 144 mmol/L 141  136  132   Potassium 3.5 - 5.2 mmol/L 4.5  4.5  4.1   Chloride 96 - 106 mmol/L 104  97  96   CO2 20 - 29 mmol/L 18  21  24    Calcium 8.7 - 10.3 mg/dL 9.8  9.9  9.0    Chest imaging: CTA Chest 08/29/21 1. No evidence for acute pulmonary embolism. 2. Persistent complete atelectasis of the left lower lobe with hyperexpansion of the left upper lobe. Central occlusion of the left lower lobe bronchus is unchanged from previous exam. Residual, retained foreign body or endobronchial lesion not excluded. Advise referral to pulmonary medicine for further management. 3. Diffuse mosaic attenuation pattern throughout both lungs likely reflects air trapping secondary to small airways disease. 4. Hepatic steatosis. Contour the liver appears slightly irregular which may reflect early cirrhosis. 5. Unchanged appearance of borderline enlarged left axillary lymph node.  CXR 05/17/21 Persistent complete atelectasis of the left lower lobe. Lung volumes are normal. No consolidative airspace disease. No pleural effusions. No pneumothorax. No pulmonary nodule or mass noted. Pulmonary vasculature and the cardiomediastinal silhouette are within normal limits.  CT Chest 05/02/21 1. There is an obstructing foreign body at the origin of the left mainstem bronchus, with total postobstructive atelectasis of the left lower lobe. This may  be an aspirated tracheoesophageal speech valve. 2. Tracheostomy. 3. Hepatic steatosis.  PFT:     No data to display          Labs:  Path:  Echo 08/31/17: LVEF 65 to 70%.  Grade 2 diastolic dysfunction.  Normal size and function.  Heart Catheterization:  Assessment & Plan:   No diagnosis found.  Discussion: Deborah [name] is a 67 year old woman, former smoker with laryngeal cancer s/p laryngectomy who returns to pulmonary clinic for asthma/COPD.  She  is to continue budesonide 0.5mg  twice daily and duonebs q6hrs.   She is to use hypertonic saline nebs for airway clearance twice daily for chest congestion and mucous plugs.   She is undergoing swallowing evaluation by ENT due to increased coughing, choking and dislodgement of prosthesis.  We will check chest x-ray today regarding her increase in cough episodes. We will check labs today CBC, CMP and BNP. We will initiate diuretic therapy if BNP elevated. May need to consider CT chest if coughing continues  She is to start Augmentin and extended prednisone taper.  She is to continue zyrtec daily for allergies and continue montelukast 10mg  daily.   She is to continue fluticasone nasal spray daily and ipratropium nasal spray twice daily for her postnasal drainage.  Follow-up in 6 months.  Melody Comas, MD [facility] Office: [phone]    Current Outpatient Medications:    albuterol (PROVENTIL) (5 MG/ML) 0.5% nebulizer solution, Take 0.5 mLs (2.5 mg total) by nebulization every 6 (six) hours as needed for wheezing or shortness of breath. Please give the patient 10 20ml vials., Disp: 200 mL, Rfl: 5   amLODipine (NORVASC) 10 MG tablet, Take 1 tablet (10 mg total) by mouth daily., Disp: 90 tablet, Rfl: 1   amoxicillin-clavulanate (AUGMENTIN) 875-125 MG tablet, Take 1 tablet by mouth 2 (two) times daily., Disp: 20 tablet, Rfl: 0   anastrozole (ARIMIDEX) 1 MG tablet, Take 1 tablet (1 mg total) by  mouth daily., Disp: 90 tablet, Rfl: 1   benzonatate (TESSALON) 200 MG capsule, TAKE 1 CAPSULE BY MOUTH TWICE DAILY AS NEEDED FOR COUGH, Disp: 20 capsule, Rfl: 0   Blood Glucose Monitoring Suppl (ACCU-CHEK GUIDE) w/Device KIT, Use in the morning, noon, and at bedtime to check blood glucose., Disp: 1 kit, Rfl: 0   budesonide (PULMICORT) 0.5 MG/2ML nebulizer solution, TAKE 2 ML (0.5 MG TOTAL) BY NEBULIZATION TWICE A DAY, Disp: 120 mL, Rfl: 11   carvedilol (COREG) 12.5 MG tablet, Take 1 tablet (12.5 mg total) by mouth 2 (two) times daily with a meal., Disp: 180 tablet, Rfl: 3   cetirizine (EQ ALLERGY RELIEF, CETIRIZINE,) 10 MG tablet, Take 1 tablet (10 mg total) by mouth daily., Disp: 90 tablet, Rfl: 1   Continuous Glucose Receiver (DEXCOM G7 RECEIVER) DEVI, USE AS DIRECTED, Disp: 1 each, Rfl: 0   Continuous Glucose Sensor (DEXCOM G7 SENSOR) MISC, 1 Device by Does not apply route as directed. Apply new sensor every 10 days, Disp: 3 each, Rfl: 11   escitalopram (LEXAPRO) 20 MG tablet, Take 1 tablet (20 mg total) by mouth daily., Disp: 90 tablet, Rfl: 1   fluticasone (FLONASE) 50 MCG/ACT nasal spray, Place 1 spray into both nostrils daily., Disp: 16 g, Rfl: 4   guaiFENesin-codeine 100-10 MG/5ML syrup, Take 10 mLs by mouth every 6 (six) hours as needed for cough., Disp: 120 mL, Rfl: 0   Humidifiers MISC, 1 application by Tracheal Tube route as needed., Disp: 1 each, Rfl: 0   insulin glargine (LANTUS) 100 UNIT/ML Solostar Pen, Inject 24 Units into the skin daily., Disp: 15 mL, Rfl: 1   insulin lispro (HUMALOG KWIKPEN) 100 UNIT/ML KwikPen, Inject 7 Units into the skin with breakfast, with lunch, and with evening meal., Disp: 15 mL, Rfl: 3   Insulin Pen Needle (BD PEN NEEDLE NANO 2ND GEN) 32G X 4 MM MISC, USE AS DIRECTED FOUR TIMES DAILY WITH INSULIN PEN., Disp: 100 each, Rfl: 2   ipratropium (ATROVENT) 0.03 % nasal spray, USE  2 SPRAY(S) IN EACH NOSTRIL EVERY 12 HOURS, Disp: 30 mL, Rfl: 5    ipratropium-albuterol (DUONEB) 0.5-2.5 (3) MG/3ML SOLN, Take 3 mLs by nebulization every 6 (six) hours., Disp: 360 mL, Rfl: 3   levothyroxine (SYNTHROID) 137 MCG tablet, Take 1 tablet (137 mcg total) by mouth daily before breakfast., Disp: 30 tablet, Rfl: 0   losartan-hydrochlorothiazide (HYZAAR) 100-25 MG tablet, Take 1 tablet by mouth daily., Disp: 90 tablet, Rfl: 1   metFORMIN (GLUCOPHAGE-XR) 500 MG 24 hr tablet, Take 1 tablet by mouth once daily with breakfast, Disp: 30 tablet, Rfl: 0   montelukast (SINGULAIR) 10 MG tablet, Take 1 tablet (10 mg total) by mouth at bedtime., Disp: 90 tablet, Rfl: 1   omeprazole (PRILOSEC) 40 MG capsule, Take 1 capsule (40 mg total) by mouth 2 (two) times daily., Disp: 180 capsule, Rfl: 1   rosuvastatin (CRESTOR) 40 MG tablet, Take 1 tablet (40 mg total) by mouth daily., Disp: 30 tablet, Rfl: 3   sodium chloride HYPERTONIC 3 % nebulizer solution, Take by nebulization 3 (three) times daily. (Patient taking differently: Take 4 mLs by nebulization as needed for cough.), Disp: 120 mL, Rfl: 5

## 2023-05-22 ENCOUNTER — Other Ambulatory Visit: Payer: Self-pay

## 2023-05-22 ENCOUNTER — Telehealth: Payer: Self-pay | Admitting: Pulmonary Disease

## 2023-05-22 ENCOUNTER — Telehealth: Payer: Self-pay

## 2023-05-22 ENCOUNTER — Other Ambulatory Visit: Payer: Self-pay | Admitting: Pulmonary Disease

## 2023-05-22 ENCOUNTER — Encounter: Payer: Self-pay | Admitting: Pulmonary Disease

## 2023-05-22 DIAGNOSIS — J4540 Moderate persistent asthma, uncomplicated: Secondary | ICD-10-CM

## 2023-05-22 LAB — COMPREHENSIVE METABOLIC PANEL
ALT: 24 U/L (ref 0–35)
AST: 33 U/L (ref 0–37)
Albumin: 4.6 g/dL (ref 3.5–5.2)
Alkaline Phosphatase: 93 U/L (ref 39–117)
BUN: 16 mg/dL (ref 6–23)
CO2: 29 meq/L (ref 19–32)
Calcium: 9.9 mg/dL (ref 8.4–10.5)
Chloride: 99 meq/L (ref 96–112)
Creatinine, Ser: 0.99 mg/dL (ref 0.40–1.20)
GFR: 59.9 mL/min — ABNORMAL LOW (ref >60.00)
Glucose, Bld: 88 mg/dL (ref 70–99)
Potassium: 4.1 meq/L (ref 3.5–5.1)
Sodium: 135 meq/L (ref 135–145)
Total Bilirubin: 0.4 mg/dL (ref 0.2–1.2)
Total Protein: 8.9 g/dL — ABNORMAL HIGH (ref 6.0–8.3)

## 2023-05-22 LAB — CBC WITH DIFFERENTIAL/PLATELET
Basophils Absolute: 0.1 10*3/uL (ref 0.0–0.1)
Basophils Relative: 1.5 % (ref 0.0–3.0)
Eosinophils Absolute: 0.4 10*3/uL (ref 0.0–0.7)
Eosinophils Relative: 6.4 % — ABNORMAL HIGH (ref 0.0–5.0)
HCT: 43.3 % (ref 36.0–46.0)
Hemoglobin: 14.3 g/dL (ref 12.0–15.0)
Lymphocytes Relative: 36.4 % (ref 12.0–46.0)
Lymphs Abs: 2.3 10*3/uL (ref 0.7–4.0)
MCHC: 33.1 g/dL (ref 30.0–36.0)
MCV: 93.1 fL (ref 78.0–100.0)
Monocytes Absolute: 0.5 10*3/uL (ref 0.1–1.0)
Monocytes Relative: 7.8 % (ref 3.0–12.0)
Neutro Abs: 3.1 10*3/uL (ref 1.4–7.7)
Neutrophils Relative %: 47.9 % (ref 43.0–77.0)
Platelets: 322 10*3/uL (ref 150.0–400.0)
RBC: 4.65 Mil/uL (ref 3.87–5.11)
RDW: 13.9 % (ref 11.5–15.5)
WBC: 6.4 10*3/uL (ref 4.0–10.5)

## 2023-05-22 LAB — BRAIN NATRIURETIC PEPTIDE: Pro B Natriuretic peptide (BNP): 14 pg/mL (ref 0.0–100.0)

## 2023-05-22 MED ORDER — SODIUM CHLORIDE 3 % IN NEBU: INHALATION_SOLUTION | Freq: Three times a day (TID) | RESPIRATORY_TRACT | 5 refills | Status: DC

## 2023-05-22 NOTE — Telephone Encounter (Signed)
Reviewed labs and chest x-ray with patient. Labs are within normal limits, no elevation in BNP.  Her chest x-ray is concerning for bronchial thickening but not other opacities or infiltrates  She is to contact us if coughing episodes are not improved after antibiotics and extended prednisone taper.   Deborah Comas, MD [facility] Office: [phone]

## 2023-05-22 NOTE — Telephone Encounter (Signed)
*  Pulm  Pharmacy Patient Advocate Encounter  Received notification from [facility] that Prior Authorization for Sodium Chloride 3% nebulizer solution  has been DENIED.  See denial reason below. No denial letter attached in CMM. Will attach denial letter to Media tab once received.   PA #/Case ID/Reference #: The medication you have requested is not covered by Medicare Part D Law. If you believe the medication is being used for a medically accepted or compendia supported indication approved by CMS, please contact your patient's plan.

## 2023-05-22 NOTE — Telephone Encounter (Signed)
I would like to get this medication approved for this patient. If we need to schedule a peer to peer please have this setup.  Thanks, Cletis Athens

## 2023-05-22 NOTE — Progress Notes (Signed)
Spoke with local pharmacy and they didn't have an active prescription on file. I have resent sodium chloride and he is going to try and run it through part b. She picked up the other medslocally as well.

## 2023-05-23 ENCOUNTER — Emergency Department (HOSPITAL_COMMUNITY)
Admission: EM | Admit: 2023-05-23 | Discharge: 2023-05-23 | Disposition: A | Discharge status: Home / Self Care | Payer: Medicare HMO | Attending: Emergency Medicine | Admitting: Emergency Medicine

## 2023-05-23 ENCOUNTER — Other Ambulatory Visit: Payer: Self-pay

## 2023-05-23 ENCOUNTER — Encounter (HOSPITAL_COMMUNITY): Payer: Self-pay | Admitting: *Deleted

## 2023-05-23 DIAGNOSIS — R300 Dysuria: Secondary | ICD-10-CM | POA: Diagnosis not present

## 2023-05-23 DIAGNOSIS — E1165 Type 2 diabetes mellitus with hyperglycemia: Secondary | ICD-10-CM | POA: Insufficient documentation

## 2023-05-23 DIAGNOSIS — R739 Hyperglycemia, unspecified: Secondary | ICD-10-CM

## 2023-05-23 DIAGNOSIS — Z794 Long term (current) use of insulin: Secondary | ICD-10-CM | POA: Insufficient documentation

## 2023-05-23 LAB — URINALYSIS, ROUTINE W REFLEX MICROSCOPIC
Bilirubin Urine: NEGATIVE
Glucose, UA: 50 mg/dL — AB
Hgb urine dipstick: NEGATIVE
Ketones, ur: NEGATIVE mg/dL
Leukocytes,Ua: NEGATIVE
Nitrite: NEGATIVE
Protein, ur: NEGATIVE mg/dL
Specific Gravity, Urine: 1.02 (ref 1.005–1.030)
pH: 6 (ref 5.0–8.0)

## 2023-05-23 LAB — CBC WITH DIFFERENTIAL/PLATELET
Abs Immature Granulocytes: 0.05 10*3/uL (ref 0.00–0.07)
Basophils Absolute: 0.1 10*3/uL (ref 0.0–0.1)
Basophils Relative: 1 %
Eosinophils Absolute: 0 10*3/uL (ref 0.0–0.5)
Eosinophils Relative: 0 %
HCT: 40 % (ref 36.0–46.0)
Hemoglobin: 13.2 g/dL (ref 12.0–15.0)
Immature Granulocytes: 1 %
Lymphocytes Relative: 22 %
Lymphs Abs: 1.6 10*3/uL (ref 0.7–4.0)
MCH: 30.2 pg (ref 26.0–34.0)
MCHC: 33 g/dL (ref 30.0–36.0)
MCV: 91.5 fL (ref 80.0–100.0)
Monocytes Absolute: 0.4 10*3/uL (ref 0.1–1.0)
Monocytes Relative: 5 %
Neutro Abs: 5.3 10*3/uL (ref 1.7–7.7)
Neutrophils Relative %: 71 %
Platelets: 329 10*3/uL (ref 150–400)
RBC: 4.37 MIL/uL (ref 3.87–5.11)
RDW: 13 % (ref 11.5–15.5)
WBC: 7.4 10*3/uL (ref 4.0–10.5)
nRBC: 0 % (ref 0.0–0.2)

## 2023-05-23 LAB — COMPREHENSIVE METABOLIC PANEL
ALT: 33 U/L (ref 0–44)
AST: 51 U/L — ABNORMAL HIGH (ref 15–41)
Albumin: 3.9 g/dL (ref 3.5–5.0)
Alkaline Phosphatase: 90 U/L (ref 38–126)
Anion gap: 12 (ref 5–15)
BUN: 19 mg/dL (ref 8–23)
CO2: 19 mmol/L — ABNORMAL LOW (ref 22–32)
Calcium: 9.3 mg/dL (ref 8.9–10.3)
Chloride: 103 mmol/L (ref 98–111)
Creatinine, Ser: 1.17 mg/dL — ABNORMAL HIGH (ref 0.44–1.00)
GFR, Estimated: 52 mL/min — ABNORMAL LOW (ref >60)
Glucose, Bld: 247 mg/dL — ABNORMAL HIGH (ref 70–99)
Potassium: 3.7 mmol/L (ref 3.5–5.1)
Sodium: 134 mmol/L — ABNORMAL LOW (ref 135–145)
Total Bilirubin: 0.8 mg/dL (ref <1.2)
Total Protein: 8 g/dL (ref 6.5–8.1)

## 2023-05-23 LAB — I-STAT VENOUS BLOOD GAS, ED
Acid-base deficit: 2 mmol/L (ref 0.0–2.0)
Bicarbonate: 21.3 mmol/L (ref 20.0–28.0)
Calcium, Ion: 1.13 mmol/L — ABNORMAL LOW (ref 1.15–1.40)
HCT: 41 % (ref 36.0–46.0)
Hemoglobin: 13.9 g/dL (ref 12.0–15.0)
O2 Saturation: 97 %
Potassium: 3.5 mmol/L (ref 3.5–5.1)
Sodium: 138 mmol/L (ref 135–145)
TCO2: 22 mmol/L (ref 22–32)
pCO2, Ven: 32.2 mm[Hg] — ABNORMAL LOW (ref 44–60)
pH, Ven: 7.429 (ref 7.25–7.43)
pO2, Ven: 87 mm[Hg] — ABNORMAL HIGH (ref 32–45)

## 2023-05-23 LAB — I-STAT CHEM 8, ED
BUN: 20 mg/dL (ref 8–23)
Calcium, Ion: 1.13 mmol/L — ABNORMAL LOW (ref 1.15–1.40)
Chloride: 105 mmol/L (ref 98–111)
Creatinine, Ser: 1 mg/dL (ref 0.44–1.00)
Glucose, Bld: 249 mg/dL — ABNORMAL HIGH (ref 70–99)
HCT: 41 % (ref 36.0–46.0)
Hemoglobin: 13.9 g/dL (ref 12.0–15.0)
Potassium: 3.5 mmol/L (ref 3.5–5.1)
Sodium: 139 mmol/L (ref 135–145)
TCO2: 20 mmol/L — ABNORMAL LOW (ref 22–32)

## 2023-05-23 LAB — BETA-HYDROXYBUTYRIC ACID: Beta-Hydroxybutyric Acid: 0.11 mmol/L (ref 0.05–0.27)

## 2023-05-23 NOTE — Discharge Instructions (Signed)
Your history, exam, and evaluation today were overall reassuring.  Your glucose was elevated and was downtrending while you were here.  We suspect your hyperglycemia is related to the steroids you were recently started on as the rest of the workup was overall reassuring.  No evidence of diabetic ketoacidosis.  No evidence of urinary tract infection at this time.  We feel you are safe for discharge home but please call your team soon as possible to discuss insulin medication titration although I suspect when you finish the steroids your glucoses will improve.  If any symptoms change or worsen acutely, please return to the nearest emergency department.  Please rest and stay hydrated.

## 2023-05-23 NOTE — ED Triage Notes (Signed)
Here by POV from home for high blood sugar, spike into 2oos, endorses HA, chills, general weakness, shaky, decreased coordination, glucose meter alarm keeps going off. Alert, NAD, calm, interactive, steady gait. Has dexiscan meter, has been using long and short term [place] injection insulin.

## 2023-05-23 NOTE — ED Provider Notes (Signed)
[place] EMERGENCY DEPARTMENT AT [facility] Provider Note   CSN: [id number] Arrival date & time: 05/23/23  1442     History  Chief Complaint  Patient presents with   Hyperglycemia    Deborah [name] is a 67 y.o. female.  The history is provided by the patient, medical records and a relative. No language interpreter was used.  Hyperglycemia Blood sugar level PTA:  200's Severity:  Moderate Onset quality:  Gradual Duration:  1 day Timing:  Intermittent Progression:  Waxing and waning Chronicity:  New Diabetes status:  Controlled with insulin Context: change in medication (started a steroid) and recent illness   Ineffective treatments:  None tried Associated symptoms: dysuria and fatigue   Associated symptoms: no abdominal pain, no blurred vision, no chest pain, no confusion, no dizziness (lightheaded slightly), no fever, no increased thirst, no nausea, no shortness of breath, no syncope, no vomiting and no weakness   Risk factors: hx of DKA        Home Medications Prior to Admission medications   Medication Sig Start Date End Date Taking? Authorizing Provider  albuterol (PROVENTIL) (5 MG/ML) 0.5% nebulizer solution Take 0.5 mLs (2.5 mg total) by nebulization every 6 (six) hours as needed for wheezing or shortness of breath. Please give the patient 10 20ml vials. 12/11/20   Sandre Kitty, MD  amLODipine (NORVASC) 10 MG tablet Take 1 tablet (10 mg total) by mouth daily. 07/03/22   Littie Deeds, MD  amoxicillin-clavulanate (AUGMENTIN) 875-125 MG tablet Take 1 tablet by mouth 2 (two) times daily. 05/21/23   Martina Sinner, MD  anastrozole (ARIMIDEX) 1 MG tablet Take 1 tablet (1 mg total) by mouth daily. 10/21/22   Serena Croissant, MD  benzonatate (TESSALON) 200 MG capsule TAKE 1 CAPSULE BY MOUTH TWICE DAILY AS NEEDED FOR COUGH 09/25/22   Littie Deeds, MD  Blood Glucose Monitoring Suppl (ACCU-CHEK GUIDE) w/Device KIT Use in the morning, noon, and at bedtime to  check blood glucose. 08/12/22   Leroy Sea, MD  budesonide (PULMICORT) 0.5 MG/2ML nebulizer solution TAKE 2 ML (0.5 MG TOTAL) BY NEBULIZATION TWICE A DAY 01/19/23   Martina Sinner, MD  carvedilol (COREG) 12.5 MG tablet Take 1 tablet (12.5 mg total) by mouth 2 (two) times daily with a meal. 09/09/22   McDiarmid, Leighton Roach, MD  cetirizine (EQ ALLERGY RELIEF, CETIRIZINE,) 10 MG tablet Take 1 tablet (10 mg total) by mouth daily. 03/16/23   Gerrit Heck, DO  Continuous Glucose Receiver (DEXCOM G7 RECEIVER) DEVI USE AS DIRECTED 03/21/23   Dameron, Nolberto Hanlon, DO  Continuous Glucose Sensor (DEXCOM G7 SENSOR) MISC 1 Device by Does not apply route as directed. Apply new sensor every 10 days 02/09/23   McDiarmid, Leighton Roach, MD  escitalopram (LEXAPRO) 20 MG tablet Take 1 tablet (20 mg total) by mouth daily. 03/05/23   McDiarmid, Leighton Roach, MD  fluticasone (FLONASE) 50 MCG/ACT nasal spray Place 1 spray into both nostrils daily. 07/03/22   Littie Deeds, MD  guaiFENesin-codeine 100-10 MG/5ML syrup Take 10 mLs by mouth every 6 (six) hours as needed for cough. 12/11/22   McDiarmid, Leighton Roach, MD  Humidifiers MISC 1 application by Tracheal Tube route as needed. 02/05/21   Littie Deeds, MD  insulin glargine (LANTUS) 100 UNIT/ML Solostar Pen Inject 24 Units into the skin daily. 03/05/23   McDiarmid, Leighton Roach, MD  insulin lispro (HUMALOG KWIKPEN) 100 UNIT/ML KwikPen Inject 7 Units into the skin with breakfast, with lunch, and with evening  meal. 12/23/22   Gerrit Heck, DO  Insulin Pen Needle (BD PEN NEEDLE NANO 2ND GEN) 32G X 4 MM MISC USE AS DIRECTED FOUR TIMES DAILY WITH INSULIN PEN. 11/17/22   Littie Deeds, MD  ipratropium (ATROVENT) 0.03 % nasal spray USE 2 SPRAY(S) IN EACH NOSTRIL EVERY 12 HOURS 05/19/22   Martina Sinner, MD  ipratropium-albuterol (DUONEB) 0.5-2.5 (3) MG/3ML SOLN TAKE 3 MLS BY NEBULIZATION EVERY 6 (SIX) HOURS. 05/22/23   Martina Sinner, MD  levothyroxine (SYNTHROID) 137 MCG tablet Take 1 tablet (137 mcg  total) by mouth daily before breakfast. 03/23/23   Gerrit Heck, DO  losartan-hydrochlorothiazide (HYZAAR) 100-25 MG tablet Take 1 tablet by mouth daily. 07/03/22   Littie Deeds, MD  metFORMIN (GLUCOPHAGE-XR) 500 MG 24 hr tablet Take 1 tablet by mouth once daily with breakfast 09/08/22   Littie Deeds, MD  montelukast (SINGULAIR) 10 MG tablet Take 1 tablet (10 mg total) by mouth at bedtime. 03/18/23   Gerrit Heck, DO  omeprazole (PRILOSEC) 40 MG capsule Take 1 capsule (40 mg total) by mouth 2 (two) times daily. 07/03/22   Littie Deeds, MD  predniSONE (DELTASONE) 10 MG tablet Take 4 tablets (40 mg total) by mouth daily with breakfast for 3 days, THEN 3 tablets (30 mg total) daily with breakfast for 3 days, THEN 2 tablets (20 mg total) daily with breakfast for 3 days, THEN 1 tablet (10 mg total) daily with breakfast for 3 days. 05/21/23 06/01/23  Martina Sinner, MD  rosuvastatin (CRESTOR) 40 MG tablet Take 1 tablet (40 mg total) by mouth daily. 03/23/23   Gerrit Heck, DO  sodium chloride HYPERTONIC 3 % nebulizer solution Take by nebulization 3 (three) times daily. 05/22/23   Martina Sinner, MD      Allergies    Lisinopril    Review of Systems   Review of Systems  Constitutional:  Positive for fatigue. Negative for chills and fever.  HENT:  Negative for congestion.   Eyes:  Negative for blurred vision and visual disturbance.  Respiratory:  Negative for cough, chest tightness, shortness of breath and wheezing.   Cardiovascular:  Negative for chest pain and syncope.  Gastrointestinal:  Negative for abdominal pain, constipation, diarrhea, nausea and vomiting.  Endocrine: Negative for polydipsia.  Genitourinary:  Positive for dysuria and urgency. Negative for flank pain and frequency.  Musculoskeletal:  Negative for back pain.  Skin:  Negative for rash and wound.  Neurological:  Positive for light-headedness. Negative for dizziness (lightheaded slightly), facial asymmetry, speech  difficulty, weakness, numbness and headaches.  Psychiatric/Behavioral:  Negative for agitation and confusion.   All other systems reviewed and are negative.   Physical Exam Updated Vital Signs BP (!) 150/90 (BP Location: Right Arm)   Pulse (!) 105   Temp 98.6 F (37 C) (Oral)   Resp 18   Wt 103.9 kg   SpO2 92%   BMI 34.82 kg/m  Physical Exam Vitals and nursing note reviewed.  Constitutional:      General: She is not in acute distress.    Appearance: She is well-developed. She is not ill-appearing, toxic-appearing or diaphoretic.  HENT:     Head: Normocephalic and atraumatic.     Right Ear: External ear normal.     Left Ear: External ear normal.     Nose: Nose normal. No congestion or rhinorrhea.     Mouth/Throat:     Mouth: Mucous membranes are moist.     Pharynx: No oropharyngeal exudate or posterior oropharyngeal  erythema.  Eyes:     Conjunctiva/sclera: Conjunctivae normal.     Pupils: Pupils are equal, round, and reactive to light.  Cardiovascular:     Pulses: Normal pulses.     Heart sounds: No murmur heard. Pulmonary:     Effort: No respiratory distress.     Breath sounds: No stridor. No wheezing, rhonchi or rales.  Chest:     Chest wall: No tenderness.  Abdominal:     General: There is no distension.     Tenderness: There is no abdominal tenderness. There is no right CVA tenderness, left CVA tenderness, guarding or rebound.  Musculoskeletal:        General: No tenderness.     Cervical back: Normal range of motion and neck supple.     Right lower leg: No edema.     Left lower leg: No edema.  Skin:    General: Skin is warm.     Capillary Refill: Capillary refill takes less than 2 seconds.     Findings: No erythema or rash.  Neurological:     General: No focal deficit present.     Mental Status: She is alert and oriented to person, place, and time.     Sensory: No sensory deficit.     Motor: No weakness or abnormal muscle tone.     Coordination:  Coordination normal.     Gait: Gait normal.     Deep Tendon Reflexes: Reflexes are normal and symmetric.  Psychiatric:        Mood and Affect: Mood normal.     ED Results / Procedures / Treatments   Labs (all labs ordered are listed, but only abnormal results are displayed) Labs Reviewed  URINALYSIS, ROUTINE W REFLEX MICROSCOPIC - Abnormal; Notable for the following components:      Result Value   Glucose, UA 50 (*)    All other components within normal limits  COMPREHENSIVE METABOLIC PANEL - Abnormal; Notable for the following components:   Sodium 134 (*)    CO2 19 (*)    Glucose, Bld 247 (*)    Creatinine, Ser 1.17 (*)    AST 51 (*)    GFR, Estimated 52 (*)    All other components within normal limits  I-STAT VENOUS BLOOD GAS, ED - Abnormal; Notable for the following components:   pCO2, Ven 32.2 (*)    pO2, Ven 87 (*)    Calcium, Ion 1.13 (*)    All other components within normal limits  I-STAT CHEM 8, ED - Abnormal; Notable for the following components:   Glucose, Bld 249 (*)    Calcium, Ion 1.13 (*)    TCO2 20 (*)    All other components within normal limits  URINE CULTURE  CBC WITH DIFFERENTIAL/PLATELET  BETA-HYDROXYBUTYRIC ACID  CBG MONITORING, ED    EKG None  Radiology No results found.  Procedures Procedures    Medications Ordered in ED Medications - No data to display  ED Course/ Medical Decision Making/ A&P                                 Medical Decision Making Amount and/or Complexity of Data Reviewed Labs: ordered.    Deborah [name] is a 67 y.o. female with past medical history significant for hypertension, hypothyroidism, GERD, COPD, CHF, diabetes with previous DKA, previous laryngectomy with tracheostomy, seizures, and currently has bronchitis who presents with elevated glucose and some  fatigue.  According to patient, she had her glucose monitor alarmed twice today going into the 200s when normally it is below 180.  She says that  she started steroids today and antibiotics for bronchitis that her pulmonologist found yesterday.  She reports that her cough is improved after the medications but her sugar started increasing.  She also reports last few days she has had some dysuria.  Denies nausea, vomiting, and is not reporting constipation or diarrhea otherwise.  She denies any chest pain or shortness of breath now.  Denies any other pains.  No other complaints.  On exam, lungs were clear without rales rhonchi or wheezing.  Chest and abdomen nontender.  Did not appreciate a murmur initially.  Good pulse in extremities.  Legs nontender nonedematous.  Normal gait.  Patient well-appearing.  Clinically I suspect the patient's hyperglycemia is likely due to this bronchitis, the new steroids, and possible UTI as well.  Will get labs and check urinalysis.  Given her history of DKA we will make sure she does not have DKA although have less suspicion for it.  Glucose now around 240 on her monitor she showed me.  Doubt DKA but will check labs.  Patient is well-appearing, anticipate discussion about altering her glucose management medications temporarily versus allowing some degree of hyperglycemia until she finishes her steroids.  Anticipate reassessment after workup.  Workup began to return and was reassuring.  She is not in DKA.  No evidence of UTI.  Patient so monitor was downtrending below 200 on reassessment.  Beta hydroxy uric acid is normal.  She is not acidotic.  Patient otherwise feeling well.  No leukocytosis and no anemia.  We agreed to hold on repeat chest x-ray and she will continue outpatient regimen.  She will call her PCP tomorrow to discuss titrating her insulin.  Patient agrees with return precautions and follow-up instructions and was discharged in good condition.         Final Clinical Impression(s) / ED Diagnoses Final diagnoses:  Hyperglycemia    Rx / DC Orders ED Discharge Orders     None        Clinical Impression: 1. Hyperglycemia     Disposition: Discharge  Condition: Good  I have discussed the results, Dx and Tx plan with the pt(& family if present). He/she/they expressed understanding and agree(s) with the plan. Discharge instructions discussed at great length. Strict return precautions discussed and pt &/or family have verbalized understanding of the instructions. No further questions at time of discharge.    Discharge Medication List as of 05/23/2023  5:52 PM      Follow Up: your PCP     [facility] Emergency Department at [facility] [address] [place] [address] [phone]         Caran Storck, Canary Brim, MD 05/23/23 [phone]

## 2023-05-25 ENCOUNTER — Encounter: Payer: Self-pay | Admitting: Family Medicine

## 2023-05-25 ENCOUNTER — Telehealth: Payer: Self-pay

## 2023-05-25 ENCOUNTER — Other Ambulatory Visit (HOSPITAL_COMMUNITY): Payer: Self-pay | Admitting: Otolaryngology

## 2023-05-25 DIAGNOSIS — R1310 Dysphagia, unspecified: Secondary | ICD-10-CM

## 2023-05-25 DIAGNOSIS — R059 Cough, unspecified: Secondary | ICD-10-CM

## 2023-05-25 LAB — URINE CULTURE

## 2023-05-25 NOTE — Telephone Encounter (Signed)
Patient calls nurse line in regards to ED visit over the weekend.   She reports her pulmonologist started her on Prednisone on 12/5 and she reports her sugars went up "past" 200 on 12/7.  She reported she felt dizzy, lightheaded and shaky which prompted her to go to the ED. She reports they discharged her home and advised to call her PCP.   She reports her home CBGs have been less than 200 and "much improved" since discharge and she feels "well."   Patient advised to continue to monitor her home CBGs. She reports compliance with her DM medications.   Patient has a FU scheduled with PCP for 06/19/2023 for DM.  Advised will forward to PCP for advisement.   Strict return precautions for hyperglycemia given.

## 2023-05-26 ENCOUNTER — Telehealth (HOSPITAL_BASED_OUTPATIENT_CLINIC_OR_DEPARTMENT_OTHER): Payer: Self-pay | Admitting: *Deleted

## 2023-05-26 NOTE — Telephone Encounter (Signed)
Post ED Visit - Positive Culture Follow-up: Successful Patient Follow-Up  Culture assessed and recommendations reviewed by:  [x]  Daylene Posey, Pharm.D. []  Celedonio Miyamoto, Pharm.D., BCPS AQ-ID []  Garvin Fila, Pharm.D., BCPS []  Georgina Pillion, [address].D., BCPS []  [place], [address].D., BCPS, AAHIVP []  Estella Husk, Pharm.D., BCPS, AAHIVP []  Lysle Pearl, PharmD, BCPS []  Phillips Climes, PharmD, BCPS []  Agapito Games, PharmD, BCPS []  Verlan Friends, PharmD  Positive urine culture  []  Patient discharged without antimicrobial prescription and treatment is now indicated []  Organism is resistant to prescribed ED discharge antimicrobial []  Patient with positive blood cultures  Changes discussed with ED provider: Reola Mosher do not treat.  Contacted patient, date 05/26/23  Bing Quarry 05/26/2023, 9:23 AM

## 2023-06-12 ENCOUNTER — Other Ambulatory Visit: Payer: Self-pay | Admitting: Pulmonary Disease

## 2023-06-12 ENCOUNTER — Ambulatory Visit (HOSPITAL_COMMUNITY)
Admission: RE | Admit: 2023-06-12 | Discharge: 2023-06-12 | Disposition: A | Discharge status: Home / Self Care | Payer: Medicare HMO | Source: Ambulatory Visit | Attending: Family Medicine | Admitting: Family Medicine

## 2023-06-12 ENCOUNTER — Encounter (HOSPITAL_COMMUNITY): Payer: Self-pay

## 2023-06-12 DIAGNOSIS — Z8521 Personal history of malignant neoplasm of larynx: Secondary | ICD-10-CM | POA: Diagnosis not present

## 2023-06-12 DIAGNOSIS — R1310 Dysphagia, unspecified: Secondary | ICD-10-CM

## 2023-06-12 DIAGNOSIS — Z923 Personal history of irradiation: Secondary | ICD-10-CM | POA: Diagnosis not present

## 2023-06-12 DIAGNOSIS — R059 Cough, unspecified: Secondary | ICD-10-CM

## 2023-06-12 DIAGNOSIS — Z9002 Acquired absence of larynx: Secondary | ICD-10-CM | POA: Insufficient documentation

## 2023-06-12 DIAGNOSIS — Z9889 Other specified postprocedural states: Secondary | ICD-10-CM | POA: Diagnosis present

## 2023-06-12 DIAGNOSIS — R0982 Postnasal drip: Secondary | ICD-10-CM

## 2023-06-12 NOTE — Progress Notes (Signed)
Modified Barium Swallow Study  Patient Details  Name: Deborah [name] MRN: [id number] Date of Birth: [birth year]  Today's Date: 06/12/2023  Modified Barium Swallow completed.  Full report located under Chart Review in the Imaging Section.  History of Present Illness Annalecia Derian is a 67 yo F who was refered by Dr. [name] for swallow function study with hx of esophageal dysphagia with stricture dilation x4.  She reports she has been having difficulty swallowing and reports feeling of stasis with solids > liquids. She has a hx of laryngeal ca with laryngectomy and radiation.  Pt uses TEP for speech and does not wear HME. Pt seen 06/05/23 with good patency of TEP and no leak.  She is followed OP by SLP through [facility].   Clinical Impression Pt presents with an esophageal dysphagia c/b reduced UES opening and absence pharyngeal stripping wave.  Bolus trials were propelled mostly by gravity.  Pt is sensate to stasis which occurs just below the level of the UES.  UES narrow, but allows for clearance of bolus trials.  She has had esophageal dilation in the past, but at present contrast is passing through UES before remaining static in upper esophagus.  This is especially notable with tablet during pill simulation.  Following boluses of thin liquids and puree did not help to transit tablet through esophagus.  Transit time and residuals increased with viscosity of bolus.  On esophageal sweep bolus trials cleared the esophagus once getting through cervical esophagus without retention of contrast throughout esophagus or at GE junction. Pt's description of swallowing difficulties very closely mirrors observations under fluoroscopy today. She is utilizing esophageal precautions and choosing foods she feels are easier to swallow.  Reviewed esophgeal dysphagia strategies and provided written handout.   Pt is not at risk for aspiration given anatomical changes post laryngectomy and is safe to continue  oral intake of whatever texture she deems appropriate.    Recommend follow up with OP SLP and ENT.  Factors that may increase risk of adverse event in presence of aspiration Rubye Oaks & Clearance Coots 2021):    Swallow Evaluation Recommendations Recommendations: PO diet PO Diet Recommendation: Regular;Thin liquids (Level 0) (choosing softer foods as needed) Liquid Administration via: Cup;Straw Medication Administration:  (As tolerated, consider halving or crushing if needed) Supervision: Patient able to self-feed Postural changes: Position pt fully upright for meals;Stay upright 30-60 min after meals Oral care recommendations: Oral care BID (2x/day) Recommended consults:  (f/u with ENT/OP SLP)      Kerrie Pleasure, MA, CCC-SLP Acute Rehabilitation Services Office: [phone] 06/12/2023,12:24 PM

## 2023-06-16 ENCOUNTER — Other Ambulatory Visit (HOSPITAL_COMMUNITY): Payer: Self-pay

## 2023-06-16 ENCOUNTER — Telehealth: Payer: Self-pay

## 2023-06-16 DIAGNOSIS — E109 Type 1 diabetes mellitus without complications: Secondary | ICD-10-CM

## 2023-06-16 MED ORDER — INSULIN ASPART 100 UNIT/ML IJ SOLN: 7.0000 [IU] | Freq: Three times a day (TID) | INTRAMUSCULAR | 6 refills | Status: DC

## 2023-06-16 MED ORDER — NOVOLOG 100 UNIT/ML IJ SOLN: 7.0000 [IU] | Freq: Three times a day (TID) | INTRAMUSCULAR | 6 refills | Status: DC

## 2023-06-16 NOTE — Telephone Encounter (Signed)
Rec'd PA request for patients Humalog insulin.   Insurance prefers [employer]. Test claim shows copay for Novolog is $0.

## 2023-06-19 ENCOUNTER — Ambulatory Visit: Payer: Medicare HMO | Admitting: Family Medicine

## 2023-06-26 ENCOUNTER — Ambulatory Visit: Payer: Medicare HMO | Admitting: Family Medicine

## 2023-07-01 ENCOUNTER — Telehealth: Payer: Self-pay

## 2023-07-01 ENCOUNTER — Ambulatory Visit (AMBULATORY_SURGERY_CENTER): Payer: Medicare HMO

## 2023-07-01 VITALS — Ht 67.0 in | Wt 233.6 lb

## 2023-07-01 DIAGNOSIS — Z1211 Encounter for screening for malignant neoplasm of colon: Secondary | ICD-10-CM

## 2023-07-01 MED ORDER — NA SULFATE-K SULFATE-MG SULF 17.5-3.13-1.6 GM/177ML PO SOLN: 1.0000 | Freq: Once | ORAL | 0 refills | Status: AC

## 2023-07-01 NOTE — Progress Notes (Signed)
 No egg or soy allergy known to patient  No issues known to pt with past sedation with any surgeries or procedures Patient denies ever being told they had issues or difficulty with intubation  No FH of Malignant Hyperthermia Pt is not on diet pills Pt is not on  home 02  Pt is not on blood thinners  Pt denies issues with constipation  No A fib or A flutter Have any cardiac testing pending--no Pt instructed to use Single[employer] or GoodRx for a price reduction on prep  Ambulates independently Patient has tracheostomy

## 2023-07-01 NOTE — Telephone Encounter (Signed)
 Requesting confirmation from CRNA supervisor that patient is able to have procedure completed at [facility] as opposed to the hospital.

## 2023-07-09 ENCOUNTER — Ambulatory Visit: Payer: Medicare HMO | Admitting: Family Medicine

## 2023-07-13 ENCOUNTER — Other Ambulatory Visit: Payer: Self-pay | Admitting: Family Medicine

## 2023-07-13 DIAGNOSIS — Z1231 Encounter for screening mammogram for malignant neoplasm of breast: Secondary | ICD-10-CM

## 2023-07-16 ENCOUNTER — Encounter: Payer: Self-pay | Admitting: Family Medicine

## 2023-07-16 ENCOUNTER — Ambulatory Visit (INDEPENDENT_AMBULATORY_CARE_PROVIDER_SITE_OTHER): Payer: Medicare HMO | Admitting: Family Medicine

## 2023-07-16 VITALS — BP 128/88 | HR 69 | Ht 67.0 in | Wt 234.0 lb

## 2023-07-16 DIAGNOSIS — J449 Chronic obstructive pulmonary disease, unspecified: Secondary | ICD-10-CM | POA: Diagnosis not present

## 2023-07-16 DIAGNOSIS — E109 Type 1 diabetes mellitus without complications: Secondary | ICD-10-CM

## 2023-07-16 DIAGNOSIS — R1032 Left lower quadrant pain: Secondary | ICD-10-CM | POA: Insufficient documentation

## 2023-07-16 LAB — POCT GLYCOSYLATED HEMOGLOBIN (HGB A1C): HbA1c, POC (controlled diabetic range): 7.1 % — AB (ref 0.0–7.0)

## 2023-07-16 MED ORDER — ONDANSETRON HCL 4 MG PO TABS: 4.0000 mg | ORAL_TABLET | Freq: Three times a day (TID) | ORAL | 0 refills | Status: DC | PRN

## 2023-07-16 NOTE — Assessment & Plan Note (Signed)
Given the intermittent nature and lack of fever uncertain whether this pain and diarrhea is caused by gastroenteritis or by diverticulitis.  Position makes me think that this is most likely a diverticulitis/diverticulosis.  Patient is scheduled to have her colon cancer screening colonoscopy on February 5, so will defer any imaging at this time.  Advised patient that regardless of the cause this would likely pass on its own and discussed supportive care measures such as pain control with Tylenol/ibuprofen, maintaining adequate hydration, and avoiding medications such as Imodium.  I will follow-up with her after her colonoscopy to discuss any findings that may require further intervention/evaluation.  Will follow-up with me in about 3 weeks to discuss this problem.

## 2023-07-16 NOTE — Assessment & Plan Note (Signed)
>>  ASSESSMENT AND PLAN FOR KETOSIS-PRONE DIABETES MELLITUS (HCC) WRITTEN ON 07/16/2023  4:54 PM BY Inas Avena, DO  After reviewing the patient's blood sugar data, and shared decision making we have increased the patient's short acting insulin  to 8 units 3 times a day for the next 4 to 5 days.  After that time if her blood sugars are consistently above 250 she will increase her short acting insulin  to 9 units 3 times a day.  She will also make an appointment to see Dr. Koval for diabetes management.

## 2023-07-16 NOTE — Assessment & Plan Note (Signed)
Patient continues to have a lingering cough after recent COPD exacerbation/bronchitis.  Advised her that it is common to have a lingering cough for several months after recovering from a conditions like bronchitis and that using over-the-counter cough remedies such as Robitussin and Mucinex is appropriate.  She will continue to use Robitussin at night but tends to make her drowsy and will try a daytime cough medicine to help with her morning cough.  We also discussed switching her to a inhaler which would be more targeted towards her COPD however with her tracheostomy she needs a nebulized form and I was unable to find Anoro Ellipta and a nebulized form.  She will discuss with Dr. Raymondo Band at her upcoming appointment with him to see if he has any ideas and I will also forward today's notes to him as well.

## 2023-07-16 NOTE — Assessment & Plan Note (Signed)
After reviewing the patient's blood sugar data, and shared decision making we have increased the patient's short acting insulin to 8 units 3 times a day for the next 4 to 5 days.  After that time if her blood sugars are consistently above 250 she will increase her short acting insulin to 9 units 3 times a day.  She will also make an appointment to see Dr. Raymondo Band for diabetes management.

## 2023-07-16 NOTE — Patient Instructions (Addendum)
It was wonderful to see you today!  For your cough, it is ok for you to use over the counter cough medicines for symptom relief. You should also talk with Dr. Raymondo Band about your COPD medicines and getting some new PFTs done.  For your insulin, we are going to increase your short acting to 8 units 3 times a day for the next 3 days, and if your readings are still above 250, increase to 9 units until you see Dr. Raymondo Band.  For your stomach pain, please take 400 mg of ibuprofen for the next two days to relieve your pain. I have also sent in some zofran for you to help with your nausea. We will follow up in 3 weeks after your colonoscopy.  Please call [phone] with any questions about today's appointment.   If you need any additional refills, please call your pharmacy before calling the office.  Gerrit Heck, DO Family Medicine

## 2023-07-16 NOTE — Progress Notes (Signed)
SUBJECTIVE:   CHIEF COMPLAINT / HPI:   ED follow up- hyperglycemia on 12/7, was supposed to follow up with me on 1/3 but had to be rescheduled multiple times due to staffing issues and inclement weather. ED doc felt that her hyperglycemia was related to the steroids she was prescribed for bronchitis at the time of the visit.   Insulin Regimen: Insulin Aspart 7 units QAC Lantus 24 units daily  Sugars are still running somewhat high throughout the day with spikes above 280 at times after meals.  Basal numbers are much better though still somewhat elevated now that she is no longer taking a daily steroid.  She presents today with chief complaint of left lower quadrant abdominal pain first occurring 2 or 3 weeks ago and lasting for about 10 days.  At that time she also had nausea vomiting and diarrhea and reports that her stools were a dark black in color.  This episode was self-limiting and she did not have any fevers or chills related to this problem.  About 2 days ago she started having stomach pain and diarrhea again this time without the color change.  She is still afebrile and denies any other symptoms at this time.  She is also still experiencing some lingering cough after her recent bout of bronchitis.  PERTINENT  PMH / PSH: Insulin dependent diabetes, COPD with tracheostomy, major depressive disorder.   OBJECTIVE:   BP 128/88   Pulse 69   Ht 5\' 7"  (1.702 m)   Wt 234 lb (106.1 kg)   SpO2 98%   BMI 36.65 kg/m   General: Alert and oriented elderly appearing female no distress Cardiac: RRR, no M/R/G Respiratory: CTAB, no increased work of breathing.  Intermittent productive cough. Abdomen: Flat, soft, nondistended.  Bowel sounds present x 4.  Left lower quadrant tenderness to palpation, voluntary guarding but no obvious peritoneal signs.  ASSESSMENT/PLAN:   Ketosis-prone diabetes mellitus (HCC) After reviewing the patient's blood sugar data, and shared decision making we  have increased the patient's short acting insulin to 8 units 3 times a day for the next 4 to 5 days.  After that time if her blood sugars are consistently above 250 she will increase her short acting insulin to 9 units 3 times a day.  She will also make an appointment to see Dr. Raymondo Band for diabetes management.  COPD (chronic obstructive pulmonary disease) (HCC) Patient continues to have a lingering cough after recent COPD exacerbation/bronchitis.  Advised her that it is common to have a lingering cough for several months after recovering from a conditions like bronchitis and that using over-the-counter cough remedies such as Robitussin and Mucinex is appropriate.  She will continue to use Robitussin at night but tends to make her drowsy and will try a daytime cough medicine to help with her morning cough.  We also discussed switching her to a inhaler which would be more targeted towards her COPD however with her tracheostomy she needs a nebulized form and I was unable to find Anoro Ellipta and a nebulized form.  She will discuss with Dr. Raymondo Band at her upcoming appointment with him to see if he has any ideas and I will also forward today's notes to him as well.  Left lower quadrant abdominal pain Given the intermittent nature and lack of fever uncertain whether this pain and diarrhea is caused by gastroenteritis or by diverticulitis.  Position makes me think that this is most likely a diverticulitis/diverticulosis.  Patient is scheduled  to have her colon cancer screening colonoscopy on February 5, so will defer any imaging at this time.  Advised patient that regardless of the cause this would likely pass on its own and discussed supportive care measures such as pain control with Tylenol/ibuprofen, maintaining adequate hydration, and avoiding medications such as Imodium.  I will follow-up with her after her colonoscopy to discuss any findings that may require further intervention/evaluation.  Will follow-up with  me in about 3 weeks to discuss this problem.    Gerrit Heck, DO [facility]

## 2023-07-21 ENCOUNTER — Encounter: Payer: Self-pay | Admitting: Certified Registered Nurse Anesthetist

## 2023-07-22 ENCOUNTER — Ambulatory Visit (AMBULATORY_SURGERY_CENTER): Payer: 59 | Admitting: Gastroenterology

## 2023-07-22 ENCOUNTER — Encounter: Payer: Self-pay | Admitting: Gastroenterology

## 2023-07-22 VITALS — BP 132/75 | HR 76 | Temp 98.1°F | Resp 17 | Ht 67.0 in | Wt 233.6 lb

## 2023-07-22 DIAGNOSIS — D123 Benign neoplasm of transverse colon: Secondary | ICD-10-CM | POA: Diagnosis not present

## 2023-07-22 DIAGNOSIS — I10 Essential (primary) hypertension: Secondary | ICD-10-CM | POA: Diagnosis not present

## 2023-07-22 DIAGNOSIS — Z1211 Encounter for screening for malignant neoplasm of colon: Secondary | ICD-10-CM

## 2023-07-22 DIAGNOSIS — K648 Other hemorrhoids: Secondary | ICD-10-CM | POA: Diagnosis not present

## 2023-07-22 DIAGNOSIS — D128 Benign neoplasm of rectum: Secondary | ICD-10-CM | POA: Diagnosis not present

## 2023-07-22 DIAGNOSIS — E039 Hypothyroidism, unspecified: Secondary | ICD-10-CM | POA: Diagnosis not present

## 2023-07-22 DIAGNOSIS — K635 Polyp of colon: Secondary | ICD-10-CM

## 2023-07-22 DIAGNOSIS — K5730 Diverticulosis of large intestine without perforation or abscess without bleeding: Secondary | ICD-10-CM

## 2023-07-22 DIAGNOSIS — J449 Chronic obstructive pulmonary disease, unspecified: Secondary | ICD-10-CM | POA: Diagnosis not present

## 2023-07-22 MED ORDER — SODIUM CHLORIDE 0.9 % IV SOLN: 500.0000 mL | Freq: Once | INTRAVENOUS | Status: DC

## 2023-07-22 NOTE — Progress Notes (Signed)
 Pt's states no medical or surgical changes since previsit or office visit.

## 2023-07-22 NOTE — Patient Instructions (Addendum)
 - Resume previous diet.                           - Continue present medications.                           - 2 polyps removed and sent to pathology.                            - Diverticulosis and hemorrhoids                           - Await pathology results.                                 - Repeat colonoscopy is recommended for                            surveillance. The colonoscopy date will be                            determined after pathology results from today's                            exam become available for review.                           Future colonoscopy should be done in the hospital                            outpatient endoscopy department for tracheostomy                            management.  YOU HAD AN ENDOSCOPIC PROCEDURE TODAY AT THE [facility]:   Refer to the procedure report that was given to you for any specific questions about what was found during the examination.  If the procedure report does not answer your questions, please call your gastroenterologist to clarify.  If you requested that your care partner not be given the details of your procedure findings, then the procedure report has been included in a sealed envelope for you to review at your convenience later.  YOU SHOULD EXPECT: Some feelings of bloating in the abdomen. Passage of more gas than usual.  Walking can help get rid of the air that was put into your GI tract during the procedure and reduce the bloating. If you had a lower endoscopy (such as a colonoscopy or flexible sigmoidoscopy) you may notice spotting of blood in your stool or on the toilet paper. If you underwent a bowel prep for your procedure, you may not have a normal bowel movement for a few days.  Please Note:  You might notice some irritation and congestion in your nose or some drainage.  This is from the oxygen  used during your procedure.  There is no need for concern and it should clear up  in a day or so.  SYMPTOMS TO REPORT IMMEDIATELY:  Following lower endoscopy (colonoscopy or flexible sigmoidoscopy):  Excessive amounts of blood in the stool  Significant tenderness or worsening of abdominal pains  Swelling of the abdomen that is new, acute  Fever of 100F or higher   For urgent or emergent issues, a gastroenterologist can be reached at any hour by calling [phone]. Do not use MyChart messaging for urgent concerns.    DIET:  We do recommend a small meal at first, but then you may proceed to your regular diet.  Drink plenty of fluids but you should avoid alcoholic beverages for 24 hours.  ACTIVITY:  You should plan to take it easy for the rest of today and you should NOT DRIVE or use heavy machinery until tomorrow (because of the sedation medicines used during the test).    FOLLOW UP: Our staff will call the number listed on your records the next business day following your procedure.  We will call around 7:15- 8:00 am to check on you and address any questions or concerns that you may have regarding the information given to you following your procedure. If we do not reach you, we will leave a message.     If any biopsies were taken you will be contacted by phone or by letter within the next 1-3 weeks.  Please call us  at [phone] if you have not heard about the biopsies in 3 weeks.    SIGNATURES/CONFIDENTIALITY: You and/or your care partner have signed paperwork which will be entered into your electronic medical record.  These signatures attest to the fact that that the information above on your After Visit Summary has been reviewed and is understood.  Full responsibility of the confidentiality of this discharge information lies with you and/or your care-partner.

## 2023-07-22 NOTE — Progress Notes (Signed)
Pt states did not take BP or Nebs since Monday 07-20-23,  albuterol neb being given, MD aware.

## 2023-07-22 NOTE — Progress Notes (Signed)
 History and Physical:  This patient presents for endoscopic testing for: Encounter Diagnosis  Name Primary?   Special screening for malignant neoplasms, colon Yes    Average risk for colorectal cancer.  2nd screening exam.   No polyps June 2014 (diverticulosis) Patient denies chronic abdominal pain, rectal bleeding, constipation or diarrhea.    Patient is otherwise without complaints or active issues today.   Past Medical History: Past Medical History:  Diagnosis Date   Anemia    h/o of   Anxiety    Arthritis    in knees   Asthma    Breast cancer (HCC)    Bronchitis    Carpal tunnel syndrome 03/16/2018   Chronic pain 01/20/2012   Patient with chronic pain at stoma site. Seen by ENT (Dr. Jesus) who recommends pain management. 12/22/11. She is currently on Vicodin and fentanyl .   04/07/13: denies stoma pain. Currently on flexeril , mobic  and tramadol  for b/l shoulder and neck pains .   COPD (chronic obstructive pulmonary disease) (HCC)    Depression    Diabetes mellitus without complication (HCC)    Diverticulosis 11/15/2012   noted on screening colonoscopy    DOE (dyspnea on exertion) 04/28/2014   Esophageal stricture    FB bronchus    Former smoker 03/19/2011   GERD (gastroesophageal reflux disease)    Heart murmur    asymptomatic    History of laryngectomy    History of radiation therapy 11/15/18- 12/06/18   Right Breast total dose 42.56 Gy in 16 fractions.    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   Leukocytopenia    Nausea alone 07/28/2013   Neck pain 01/21/2012   Normal MRI 07/14/2011   negative for mestasis    Plantar fasciitis 06/06/2016   Pneumonia 2012   RUQ  abdominal pain 08/08/2022   On exam patient with marked tenderness to percussion and palpation RUQ. Obesity hinder palpation  PLan   Sciatica    Seizures (HCC) 06/29/2011   07/24/11 off Effexor w/o seizure   Sepsis (HCC) 08/04/2012   Sinusitis, chronic 07/20/2011   Bilateral maxillary, identified on MRI of head 07/14/11.     Tracheostomy dependent [facility])      Past Surgical History: Past Surgical History:  Procedure Laterality Date   BREAST LUMPECTOMY Right 07/06/2018   BREAST LUMPECTOMY WITH RADIOACTIVE SEED LOCALIZATION Right 07/06/2018   Procedure: RIGHT BREAST LUMPECTOMY WITH RADIOACTIVE SEED LOCALIZATION;  Surgeon: Ebbie Cough, MD;  Location: [facility] OR;  Service: General;  Laterality: Right;   COLONOSCOPY N/A 11/15/2012   Procedure: COLONOSCOPY;  Surgeon: Princella CHRISTELLA Nida, MD;  Location: [facility];  Service: Endoscopy;  Laterality: N/A;   CRYOTHERAPY  05/11/2021   Procedure: CRYOTHERAPY;  Surgeon: Brenna Adine CROME, DO;  Location: [facility];  Service: Pulmonary;;   DENTAL RESTORATION/EXTRACTION WITH X-RAY     ESOPHAGEAL DILATION N/A 09/09/2019   Procedure: ESOPHAGEAL DILATION;  Surgeon: Jesus Oliphant, MD;  Location: [facility];  Service: ENT;  Laterality: N/A;   ESOPHAGEAL DILATION N/A 10/05/2019   Procedure: ESOPHAGEAL DILATION;  Surgeon: Jesus Oliphant, MD;  Location: [facility];  Service: ENT;  Laterality: N/A;  via tracheostomy   ESOPHAGEAL DILATION N/A  02/27/2020   Procedure: ESOPHAGEAL DILATION;  Surgeon: Jesus Oliphant, MD;  Location: [facility];  Service: ENT;  Laterality: N/A;   ESOPHAGOGASTRODUODENOSCOPY (EGD) WITH PROPOFOL  N/A 08/12/2018   Procedure: ESOPHAGOGASTRODUODENOSCOPY (EGD) WITH PROPOFOL ;  Surgeon: Legrand Victory LITTIE DOUGLAS, MD;  Location: [facility];  Service: Gastroenterology;  Laterality: N/A;   ESOPHAGOSCOPY  06/21/2012   Procedure: ESOPHAGOSCOPY;  Surgeon: Oliphant Jesus, MD;  Location: [facility];  Service: ENT;   Laterality: N/A;   ESOPHAGOSCOPY N/A 06/24/2021   Procedure: ESOPHAGOSCOPY WITH DILATATION;  Surgeon: Jesus Oliphant, MD;  Location: [facility];  Service: ENT;  Laterality: N/A;   ESOPHAGOSCOPY N/A 02/14/2022   Procedure: ESOPHAGOSCOPY WITH DILATATION;  Surgeon: Jesus Oliphant, MD;  Location: [facility] OR;  Service: ENT;  Laterality: N/A;   ESOPHAGOSCOPY N/A 08/11/2022   Procedure: ESOPHAGOSCOPY WITH DILATATION;  Surgeon: Jesus Oliphant, MD;  Location[facility] OR;  Service: ENT;  Laterality: N/A;   ESOPHAGOSCOPY WITH DILITATION N/A 09/21/2014   Procedure: ESOPHAGOSCOPY WITH DILITATION;  Surgeon: Oliphant Jesus, MD;  Location: [facility] OR;  Service: ENT;  Laterality: N/A;   ESOPHAGOSCOPY WITH DILITATION N/A 07/04/2016   Procedure: ESOPHAGOSCOPY WITH DILITATION;  Surgeon: Oliphant Jesus, MD;  Location: [facility] OR;  Service: ENT;  Laterality: N/A;   ESOPHAGOSCOPY WITH DILITATION N/A 12/01/2017   Procedure: ESOPHAGOSCOPY WITH DILITATION;  Surgeon: Jesus Oliphant, MD;  Location: [facility] OR;  Service: ENT;  Laterality: N/A;   ESOPHAGOSCOPY WITH DILITATION N/A 04/19/2018   Procedure: ESOPHAGOSCOPY WITH DILITATION;  Surgeon: Jesus Oliphant, MD;  Location: [facility] OR;  Service: ENT;  Laterality: N/A;   ESOPHAGOSCOPY WITH DILITATION N/A 03/07/2019   Procedure: Esophagoscopy with dilatation;  Surgeon: Jesus Oliphant, MD;  Location: [facility];  Service: ENT;  Laterality: N/A;   ESOPHAGOSCOPY WITH DILITATION N/A 07/16/2020   Procedure: ESOPHAGOSCOPY WITH DILATION;  Surgeon: Jesus Oliphant, MD;  Location: [facility];  Service: ENT;  Laterality: N/A;   ESOPHAGOSCOPY WITH DILITATION N/A 10/08/2020   Procedure: ESOPHAGOSCOPY With Dilation;  Surgeon: Jesus Oliphant, MD;  Location: [facility];  Service: ENT;  Laterality: N/A;  21 French to 51 french   FLEXIBLE BRONCHOSCOPY  01/08/2018       FOREIGN BODY REMOVAL  05/11/2021   Procedure: FOREIGN BODY REMOVAL;  Surgeon: Brenna Adine LITTIE, DO;  Location: [facility];   Service: Pulmonary;;   FOREIGN BODY REMOVAL BRONCHIAL  10/02/2011   Procedure: REMOVAL FOREIGN BODY BRONCHIAL;  Surgeon: Merilee Kraft, MD;  Location: [facility] OR;  Service: ENT;  Laterality: N/A;   FOREIGN BODY REMOVAL BRONCHIAL N/A 01/08/2018   Procedure: REMOVAL FOREIGN BODY BRONCHIAL;  Surgeon: Arlana Arnt, MD;  Location: [facility];  Service: ENT;  Laterality: N/A;   HEMOSTASIS CONTROL  05/11/2021   Procedure: HEMOSTASIS CONTROL;  Surgeon: Brenna Adine LITTIE, DO;  Location: [facility];  Service: Pulmonary;;   LARYNGECTOMY     Porta cath removal     PORTACATH PLACEMENT  09/17/10   Tip in cavoatrial junction   RE-EXCISION OF BREAST CANCER,SUPERIOR MARGINS Right 07/29/2018   Procedure: RE-EXCISION OF RIGHT BREAST MEDIAL MARGINS;  Surgeon: Ebbie Cough, MD;  Location: [facility] OR;  Service: General;  Laterality: Right;   RIGID ESOPHAGOSCOPY N/A 03/19/2019   Procedure: FLEXIBLE ESOPHAGOSCOPY;  Surgeon: Arlana Arnt, MD;  Location: [facility] OR;  Service: ENT;  Laterality: N/A;   STOMAPLASTY N/A 10/21/2016   Procedure: LINNA;  Surgeon: Jesus Oliphant, MD;  Location: [facility] OR;  Service: ENT;  Laterality: N/A;  TOTAL KNEE ARTHROPLASTY Right 12/12/2019   Procedure: RIGHT TOTAL KNEE ARTHROPLASTY;  Surgeon: Jerri Kay HERO, MD;  Location: [facility] OR;  Service: Orthopedics;  Laterality: Right;   TOTAL KNEE ARTHROPLASTY Left 04/01/2021   Procedure: LEFT TOTAL KNEE ARTHROPLASTY;  Surgeon: Jerri Kay HERO, MD;  Location: [facility];  Service: Orthopedics;  Laterality: Left;   TRACHEAL DILITATION  07/16/2011   Procedure: TRACHEAL DILITATION;  Surgeon: Ida VEAR Loader, MD;  Location: [facility] OR;  Service: ENT;  Laterality: N/A;  dilation of tracheal stoma and replacement of stoma tube   TRACHEAL ESOPHAGEAL PROSTHESIS (TEP) CHANGE N/A 08/11/2022   Procedure: TRACHEAL ESOPHAGEAL PROSTHESIS (TEP) CHANGE;  Surgeon: Loader Ida, MD;  Location: [facility] OR;  Service: ENT;  Laterality: N/A;  ATOS TEP SET   TUBAL LIGATION  1982   VIDEO BRONCHOSCOPY  Bilateral 05/11/2021   Procedure: VIDEO BRONCHOSCOPY WITHOUT FLUORO;  Surgeon: Brenna Adine CROME, DO;  Location: [facility];  Service: Pulmonary;  Laterality: Bilateral;  w/ cryotherapy, Foreign body extraction    Allergies: Allergies  Allergen Reactions   Lisinopril  Cough    Outpatient Meds: Current Outpatient Medications  Medication Sig Dispense Refill   amLODipine  (NORVASC ) 10 MG tablet Take 1 tablet (10 mg total) by mouth daily. 90 tablet 1   Blood Glucose Monitoring Suppl (ACCU-CHEK GUIDE) w/Device KIT Use in the morning, noon, and at bedtime to check blood glucose. 1 kit 0   budesonide  (PULMICORT ) 0.5 MG/2ML nebulizer solution TAKE 2 ML (0.5 MG TOTAL) BY NEBULIZATION TWICE A DAY 120 mL 11   carvedilol  (COREG ) 12.5 MG tablet Take 1 tablet (12.5 mg total) by mouth 2 (two) times daily with a meal. 180 tablet 3   cetirizine  (EQ ALLERGY RELIEF, CETIRIZINE ,) 10 MG tablet Take 1 tablet (10 mg total) by mouth daily. 90 tablet 1   Continuous Glucose Receiver (DEXCOM G7 RECEIVER) DEVI USE AS DIRECTED 1 each 0   Continuous Glucose Sensor (DEXCOM G7 SENSOR) MISC 1 Device by Does not apply route as directed. Apply new sensor every 10 days 3 each 11   escitalopram  (LEXAPRO ) 20 MG tablet Take 1 tablet (20 mg total) by mouth daily. 90 tablet 1   fluticasone  (FLONASE ) 50 MCG/ACT nasal spray Place 1 spray into both nostrils daily. 16 g 4   Humidifiers MISC 1 application by Tracheal Tube route as needed. 1 each 0   insulin  aspart (NOVOLOG ) 100 UNIT/ML injection Inject 7 Units into the skin 3 (three) times daily with meals. 10 mL 6   insulin  glargine (LANTUS ) 100 UNIT/ML Solostar Pen Inject 24 Units into the skin daily. 15 mL 1   Insulin  Pen Needle (BD PEN NEEDLE NANO 2ND GEN) 32G X 4 MM MISC USE AS DIRECTED FOUR TIMES DAILY WITH INSULIN  PEN. 100 each 2   ipratropium (ATROVENT ) 0.03 % nasal spray USE 2 SPRAY(S) IN EACH NOSTRIL EVERY 12 HOURS 30 mL 0   ipratropium-albuterol  (DUONEB) 0.5-2.5 (3) MG/3ML  SOLN TAKE 3 MLS BY NEBULIZATION EVERY 6 (SIX) HOURS. 360 mL 3   levothyroxine  (SYNTHROID ) 137 MCG tablet Take 1 tablet (137 mcg total) by mouth daily before breakfast. 30 tablet 0   losartan -hydrochlorothiazide  (HYZAAR) 100-25 MG tablet Take 1 tablet by mouth daily. 90 tablet 1   metFORMIN  (GLUCOPHAGE -XR) 500 MG 24 hr tablet Take 1 tablet by mouth once daily with breakfast 30 tablet 0   montelukast  (SINGULAIR ) 10 MG tablet Take 1 tablet (10 mg total) by mouth at bedtime. 90 tablet 1   omeprazole  (PRILOSEC) 40 MG  capsule Take 1 capsule (40 mg total) by mouth 2 (two) times daily. 180 capsule 1   ondansetron  (ZOFRAN ) 4 MG tablet Take 1 tablet (4 mg total) by mouth every 8 (eight) hours as needed for nausea or vomiting. 20 tablet 0   rosuvastatin  (CRESTOR ) 40 MG tablet Take 1 tablet (40 mg total) by mouth daily. 30 tablet 3   sodium chloride  HYPERTONIC 3 % nebulizer solution Take by nebulization 3 (three) times daily. 750 mL 5   albuterol  (PROVENTIL ) (5 MG/ML) 0.5% nebulizer solution Take 0.5 mLs (2.5 mg total) by nebulization every 6 (six) hours as needed for wheezing or shortness of breath. Please give the patient 10 20ml vials. 200 mL 5   guaiFENesin -codeine  100-10 MG/5ML syrup Take 10 mLs by mouth every 6 (six) hours as needed for cough. (Patient not taking: Reported on 07/22/2023) 120 mL 0   Current Facility-Administered Medications  Medication Dose Route Frequency Provider Last Rate Last Admin   0.9 %  sodium chloride  infusion  500 mL Intravenous Once Danis, Stayce Delancy L III, MD          ___________________________________________________________________ Objective   Exam:  BP (!) 217/122   Pulse 83   Temp 98.1 F (36.7 C) (Temporal)   Resp 16   Ht 5' 7 (1.702 m)   Wt 233 lb 9.6 oz (106 kg)   SpO2 90%   BMI 36.59 kg/m  tracheostomy CV: regular , S1/S2 Resp: clear to auscultation bilaterally, normal RR and effort noted GI: soft, no tenderness, with active bowel  sounds.   Assessment: Encounter Diagnosis  Name Primary?   Special screening for malignant neoplasms, colon Yes     Plan: Colonoscopy   The benefits and risks of the planned procedure were described in detail with the patient or (when appropriate) their health care proxy.  Risks were outlined as including, but not limited to, bleeding, infection, perforation, adverse medication reaction leading to cardiac or pulmonary decompensation, pancreatitis (if ERCP).  The limitation of incomplete mucosal visualization was also discussed.  No guarantees or warranties were given.  The patient is appropriate for an endoscopic procedure in the ambulatory setting.   - Victory Brand, MD

## 2023-07-22 NOTE — Progress Notes (Signed)
 Called to room to assist during endoscopic procedure.  Patient ID and intended procedure confirmed with present staff. Received instructions for my participation in the procedure from the performing physician.

## 2023-07-22 NOTE — Op Note (Signed)
 [facility] Patient Name: Deborah [name] Procedure Date: 07/22/2023 8:39 AM MRN: [id number] Endoscopist: Victory L. Legrand , MD, [id number] Age: 68 Referring MD:  Date of Birth: [birth year] Gender: Female Account #: [id number] Procedure:                Colonoscopy Indications:              Screening for colorectal malignant neoplasm                           no polyps June 2014 Medicines:                Monitored Anesthesia Care Procedure:                Pre-Anesthesia Assessment:                           - Prior to the procedure, a History and Physical                            was performed, and patient medications and                            allergies were reviewed. The patient's tolerance of                            previous anesthesia was also reviewed. The risks                            and benefits of the procedure and the sedation                            options and risks were discussed with the patient.                            All questions were answered, and informed consent                            was obtained. Prior Anticoagulants: The patient has                            taken no anticoagulant or antiplatelet agents. ASA                            Grade Assessment: III - A patient with severe                            systemic disease. After reviewing the risks and                            benefits, the patient was deemed in satisfactory                            condition to undergo the procedure.  After obtaining informed consent, the colonoscope                            was passed under direct vision. Throughout the                            procedure, the patient's blood pressure, pulse, and                            oxygen  saturations were monitored continuously. The                            CF [id number] #[id number] was introduced through the anus                            and advanced to the the cecum,  identified by                            appendiceal orifice and ileocecal valve. The                            colonoscopy was performed without difficulty. The                            patient tolerated the procedure fairly well. The                            quality of the bowel preparation was excellent. The                            ileocecal valve, appendiceal orifice, and rectum                            were photographed. Scope In: 9:04:39 AM Scope Out: 9:15:42 AM Scope Withdrawal Time: 0 hours 8 minutes 44 seconds  Total Procedure Duration: 0 hours 11 minutes 3 seconds  Findings:                 The perianal and digital rectal examinations were                            normal.                           Repeat examination of right colon under NBI                            performed.                           Two sessile polyps were found in the proximal                            rectum and transverse colon. The polyps were 3 to 6  mm in size. These polyps were removed with a cold                            snare. Resection and retrieval were complete.                           Multiple diverticula were found in the left colon                            and right colon.                           Internal hemorrhoids were found.                           The exam was otherwise without abnormality on                            direct and retroflexion views. Complications:            No immediate complications. Estimated Blood Loss:     Estimated blood loss was minimal. Impression:               - Two 3 to 6 mm polyps in the proximal rectum and                            in the transverse colon, removed with a cold snare.                            Resected and retrieved.                           - Diverticulosis in the left colon and in the right                            colon.                           - Internal hemorrhoids.                            - The examination was otherwise normal on direct                            and retroflexion views. Recommendation:           - Patient has a contact number available for                            emergencies. The signs and symptoms of potential                            delayed complications were discussed with the                            patient. Return to normal activities tomorrow.  Written discharge instructions were provided to the                            patient.                           - Resume previous diet.                           - Continue present medications.                           - Await pathology results.                           - Repeat colonoscopy is recommended for                            surveillance. The colonoscopy date will be                            determined after pathology results from today's                            exam become available for review.                           Future colonoscopy should be done in the hospital                            outpatient endoscopy department for tracheostomy                            management. Carissa Musick L. Legrand, MD 07/22/2023 9:21:41 AM This report has been signed electronically.

## 2023-07-22 NOTE — Progress Notes (Signed)
 0900 BP 202/119, Labetalol given IV, MD update, vss

## 2023-07-22 NOTE — Progress Notes (Signed)
 0850 BP 201/116, Labetalol given IV, MD update, vss

## 2023-07-22 NOTE — Progress Notes (Signed)
 Report given to PACU, vss

## 2023-07-23 ENCOUNTER — Telehealth: Payer: Self-pay | Admitting: *Deleted

## 2023-07-23 ENCOUNTER — Encounter: Payer: Self-pay | Admitting: Pharmacist

## 2023-07-23 ENCOUNTER — Ambulatory Visit: Payer: 59 | Admitting: Pharmacist

## 2023-07-23 VITALS — BP 168/83 | HR 90 | Wt 235.8 lb

## 2023-07-23 DIAGNOSIS — E109 Type 1 diabetes mellitus without complications: Secondary | ICD-10-CM | POA: Diagnosis not present

## 2023-07-23 DIAGNOSIS — J449 Chronic obstructive pulmonary disease, unspecified: Secondary | ICD-10-CM | POA: Diagnosis not present

## 2023-07-23 MED ORDER — REVEFENACIN 175 MCG/3ML IN SOLN: 175.0000 ug | Freq: Every day | RESPIRATORY_TRACT | 6 refills | Status: DC

## 2023-07-23 MED ORDER — FORMOTEROL FUMARATE 20 MCG/2ML IN NEBU: 20.0000 ug | INHALATION_SOLUTION | Freq: Two times a day (BID) | RESPIRATORY_TRACT | 6 refills | Status: DC

## 2023-07-23 NOTE — Progress Notes (Signed)
 S:     Chief Complaint  Patient presents with   Medication Management    Diabetes, Hypertension, COPD   68 y.o. female who presents for diabetes evaluation, education, and management. Patient arrives in  good spirits and presents without any assistance.   Patient reports blood sugars remain high in the 270-280s after finish prednisone  for COPD exacerbation/bronchitis. Patient did not bring CGM receiver to visit to assess home readings. Lingering cough is still present despite current COPD regimen and the use of OTC robitussin.   Patient was referred and last seen by Primary Care Provider, Dr. CANDIE Pinal, on 07/16/2023.   PMH is significant for T2DM, HTN, HLD, MDD.  At last visit, increased short acting insulin  to 8 units TID with meals for 4-5 days until blood sugar decrease after prednisone  use.   Patient reports Diabetes was diagnosed in 2024.   Current diabetes medications include: Lantus  (insulin  glargine) 24 units daily, Humalog  (insulin  lispro) 7 units TID with meals, metformin  500 mg daily Current hypertension medications include: amlodipine  10 mg daily, carvedilol  12.5 mg BID, losartan -hydrochlorothiazide  100-25 mg daily Current hyperlipidemia medications include: rosuvastatin  5 mg daily Current COPD medications include: Proventil  (albuterol )  2.5 mg PRN, Pulmnicort (budesonide ) 0.5 mg BID, Atrovent  (ipratropium)2 sprays each nostril BID, Duoneb (albuterol /ipratropium) 3 mL PRN, Singulair  (montelukast ) 10 mg daily  Patient reports adherence to taking all medications as prescribed.   Patient reports hypoglycemic events with BG in the 60s.   Reported home blood sugars: 270 - 280s     O:   Review of Systems  Respiratory:  Positive for cough, sputum production and wheezing.     Physical Exam Constitutional:      Appearance: Normal appearance.  Pulmonary:     Breath sounds: Wheezing present.  Neurological:     Mental Status: She is alert.  Psychiatric:        Mood  and Affect: Mood normal.        Behavior: Behavior normal.        Thought Content: Thought content normal.        Judgment: Judgment normal.      Lab Results  Component Value Date   HGBA1C 7.1 (A) 07/16/2023   Vitals:   07/23/23 0949  BP: (!) 168/83  Pulse: 90  SpO2: 94%    Lipid Panel     Component Value Date/Time   CHOL 225 (H) 03/16/2023 1621   TRIG 267 (H) 03/16/2023 1621   HDL 57 03/16/2023 1621   CHOLHDL 3.9 03/16/2023 1621   CHOLHDL 2.9 03/20/2011 0840   VLDL 15 03/20/2011 0840   LDLCALC 121 (H) 03/16/2023 1621    Clinical Atherosclerotic Cardiovascular Disease (ASCVD): No  The 10-year ASCVD risk score (Arnett DK, et al., 2019) is: 39%   Values used to calculate the score:     Age: 70 years     Sex: Female     Is Non-Hispanic African American: Yes     Diabetic: Yes     Tobacco smoker: No     Systolic Blood Pressure: 168 mmHg     Is BP treated: Yes     HDL Cholesterol: 57 mg/dL     Total Cholesterol: 225 mg/dL   A/P: Diabetes longstanding currently controlled. Patient is able to verbalize appropriate hypoglycemia management plan. Medication adherence appears good. Recent, pre-illness control, was acceptable however is recently suboptimal due to recent prednisone  use; last A1c 7.1 (06/2023). -Continued basal insulin   Lantus  (insulin  glargine) 24 units daily  in the morning. Continued rapid insulin  Novolog  (insulin  aspart) 7-9 units TID with meals.  Encouraged higher dose on sliding scale while ill and recovering from respiratory illness. -Continued metformin  500 mg once daily  -Patient educated on purpose, proper use, and potential adverse effects.  -Extensively discussed pathophysiology of diabetes, recommended lifestyle interventions, dietary effects on blood sugar control.  -Counseled on s/sx of and management of hypoglycemia.  -Next A1c anticipated next office visit.   ASCVD risk - primary  prevention in patient with diabetes. Last LDL is 121 not at goal of  <70  mg/dL. ASCVD risk factors include T2DM. High intensity statin indicated.  -Continued rosuvastatin  40 mg.   Hypertension longstanding currently uncontrolled. Blood pressure goal of <130/80 mmHg. Medication adherence good. Blood pressure control is suboptimal due to acute illness. -Continued amlodipine  10 mg daily, carvedilol  12.5 mg BID, losartan -hydrochlorothiazide  100-25 mg daily.  COPD - currently uncontrolled. Patient continues to have lingering wet cough after recent COPD exacerbation/bronchitis.  -Initiated formoterol  nebulizer 20 mcg BID -Initiated revefenacin  nebulizer 175 mcg daily  -Increased pulmnicort nebulizer 0.5 mg BID Other nebulized medications can be kept in her possession for PRN use (albuterol  and albuterol /ipratropium)   Written patient instructions provided. Patient verbalized understanding of treatment plan.  Total time in face to face counseling 32 minutes.    Follow-up:  Pharmacist TBD PCP clinic visit in 08/13/2023 Patient seen with Owens Cowing, PharmD Candidate and Doyal Goods, PharmD Candidate.

## 2023-07-23 NOTE — Assessment & Plan Note (Signed)
 COPD - currently uncontrolled. Patient continues to have lingering wet cough after recent COPD exacerbation/bronchitis.  -Initiated formoterol  nebulizer 20 mcg BID -Initiated revefenacin  nebulizer 175 mcg daily  -Increased pulmnicort nebulizer 0.5 mg BID Other nebulized medications can be kept in her possession for PRN use (albuterol  and albuterol /ipratropium)

## 2023-07-23 NOTE — Patient Instructions (Addendum)
 It was nice to see you today!  Your goal blood sugar is 80-130 before eating and less than 180 after eating.  Medication Changes: START formoterol  (perforomist ) 20 mcg/2mL nebulizer solution 2 (two) times daily  START revefenacin  (Yupelri ) 175 mcg/3mL nebulizer solution once daily   Increase budesonide  (Pulmnicort) 0.5 mg/60mL nebulizer solution 2 (two) times daily   Continue all other medication the same.   Monitor blood sugars at home and keep a log (glucometer or piece of paper) to bring with you to your next visit.  Keep up the good work with diet and exercise. Aim for a diet full of vegetables, fruit and lean meats (chicken, turkey, fish). Try to limit salt intake by eating fresh or frozen vegetables (instead of canned), rinse canned vegetables prior to cooking and do not add any additional salt to meals.

## 2023-07-23 NOTE — Assessment & Plan Note (Signed)
 Diabetes longstanding currently controlled. Patient is able to verbalize appropriate hypoglycemia management plan. Medication adherence appears good. Recent, pre-illness control, was acceptable however is recently suboptimal due to recent prednisone  use; last A1c 7.1 (06/2023). -Continued basal insulin   Lantus  (insulin  glargine) 24 units daily in the morning. Continued rapid insulin  Novolog  (insulin  aspart) 7-9 units TID with meals.  Encouraged higher dose on sliding scale while ill and recovering from respiratory illness. -Continued metformin  500 mg once daily  -Patient educated on purpose, proper use, and potential adverse effects.  -Extensively discussed pathophysiology of diabetes, recommended lifestyle interventions, dietary effects on blood sugar control.  -Counseled on s/sx of and management of hypoglycemia.

## 2023-07-23 NOTE — Assessment & Plan Note (Signed)
>>  ASSESSMENT AND PLAN FOR KETOSIS-PRONE DIABETES MELLITUS ([facility]) WRITTEN ON 07/23/2023 11:31 AM BY Ichelle Harral G, RPH-CPP  Diabetes longstanding currently controlled. Patient is able to verbalize appropriate hypoglycemia management plan. Medication adherence appears good. Recent, pre-illness control, was acceptable however is recently suboptimal due to recent prednisone  use; last A1c 7.1 (06/2023). -Continued basal insulin   Lantus  (insulin  glargine) 24 units daily in the morning. Continued rapid insulin  Novolog  (insulin  aspart) 7-9 units TID with meals.  Encouraged higher dose on sliding scale while ill and recovering from respiratory illness. -Continued metformin  500 mg once daily  -Patient educated on purpose, proper use, and potential adverse effects.  -Extensively discussed pathophysiology of diabetes, recommended lifestyle interventions, dietary effects on blood sugar control.  -Counseled on s/sx of and management of hypoglycemia.

## 2023-07-23 NOTE — Telephone Encounter (Signed)
  Follow up Call-     07/22/2023    7:41 AM  Call back number  Post procedure Call Back phone  # [phone]  Permission to leave phone message Yes     Patient questions:  Do you have a fever, pain , or abdominal swelling? No. Pain Score  0 *  Have you tolerated food without any problems? Yes.    Have you been able to return to your normal activities? Yes.    Do you have any questions about your discharge instructions: Diet   No. Medications  No. Follow up visit  No.  Do you have questions or concerns about your Care? No.  Actions: * If pain score is 4 or above: No action needed, pain <4.

## 2023-07-24 LAB — SURGICAL PATHOLOGY

## 2023-07-28 ENCOUNTER — Encounter: Payer: Self-pay | Admitting: Gastroenterology

## 2023-07-29 ENCOUNTER — Other Ambulatory Visit (HOSPITAL_COMMUNITY): Payer: Self-pay

## 2023-07-29 NOTE — Progress Notes (Signed)
Reviewed and agree with Dr Macky Lower plan.

## 2023-08-04 ENCOUNTER — Other Ambulatory Visit: Payer: Self-pay

## 2023-08-04 ENCOUNTER — Encounter (HOSPITAL_COMMUNITY): Payer: Self-pay | Admitting: Otolaryngology

## 2023-08-04 NOTE — Progress Notes (Signed)
 Reviewed and agree with Dr Macky Lower plan.

## 2023-08-04 NOTE — Anesthesia Preprocedure Evaluation (Signed)
Anesthesia Evaluation    Airway        Dental   Pulmonary former smoker          Cardiovascular hypertension,      Neuro/Psych    GI/Hepatic   Endo/Other  diabetes    Renal/GU      Musculoskeletal   Abdominal   Peds  Hematology   Anesthesia Other Findings   Reproductive/Obstetrics                             Anesthesia Physical Anesthesia Plan  ASA:   Anesthesia Plan:    Post-op Pain Management:    Induction:   PONV Risk Score and Plan:   Airway Management Planned:   Additional Equipment:   Intra-op Plan:   Post-operative Plan:   Informed Consent:   Plan Discussed with:   Anesthesia Plan Comments: (PAT note written 08/04/2023 by Shonna Chock, PA-C.  )       Anesthesia Quick Evaluation

## 2023-08-04 NOTE — Progress Notes (Signed)
Anesthesia Chart Review: Deborah [name]  Case: [id number] Date/Time: 08/05/23 1045   Procedure: ESOPHAGOSCOPY WITH DILITATION   Anesthesia type: General   Pre-op diagnosis: Esophageal Stricture   [facility] / [facility]   Surgeons: Serena Colonel, MD       DISCUSSION: Patient is a 68 year old female scheduled for the above procedure. She has a history of tracheostomy with tracheoesophageal puncture (TEP) prosthesis which is a small plastic or silicone valve that fits into the opening.  She was last evaluated by [facility] SLP on 06/05/23 for concern of TEP prothesis partial dislodgment. He has a "17 Fr 8mm Provox Vega TEP prosthesis." She reported TEP was "too long and could feel it". TEP noted with 1 mm between anterior flange and posterior tracheal wall with no leak. Per SLP, "Prosthesis was well-seated against posterior tracheal wall. As patient digitally occluded bare tracheostoma patient has noticing location of upper level of flange. This is not indicative of dislodgment."  She was referred by Dr. [name] for modified barium swallow study on 06/12/23. 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 On esophageal sweep bolus trials cleared the esophagus once getting through cervical esophagus without retention of contrast throughout esophagus or at GE junction. SLP wrote, "Pt is not at risk for aspiration given anatomical changes post laryngectomy and is safe to continue oral intake of whatever texture she deems appropriate." Esophagoscopy with dilatation recommended.   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    Followed by pulmonologist Dr. Francine Graven for asthma/COPD. Last visit 05/21/23. She had increased coughing, Was awaiting swallow evaluation at that time. Budesonide 0.5mg  twice daily and Duonebs Q 6 hours continued. Also using hypertonic saline nebs for airway clearance twice daily for chest congestion and mucous plugs. CXR ordered and showed persistent LLL collapse but no new opacities or infiltrates. She was treated with Augmentin and extended steroid taper. Continue Zyrtec and montelukast for allergies and fluticasone and ipratropium nasal spray for postnasal drainage.  49-month follow-up planned. Of note, at PharmD visit with Madelon Lips, RPH-CPP on 07/22/22 formoterol nebulizer and revefenacin nebulizer initiated and Pulmicort nebulizer increased to 0.5 mg twice daily for lingering cough.   When TEP is in place she has been able to cover her stoma to force air into the esophagus through the valve. She had previously used a Larlytube to cover her stoma. Information regarding prothesis brand/size has been recorded in the [facility] notes in Care Everywhere,    Multiple anesthesia records available for review in [facility] as needed.     She is for labs on arrival as indicated. Last A1c 7.1% on 07/16/23.  She has a Dexcom G7 CGM.  By medication list, she is taking Semglee 25 units every morning and NovoLog 7 units 3 times daily with meals.  Metformin is listed as currently not taking.    VS:  Wt Readings from Last 3 Encounters:  07/23/23 107 kg  07/22/23 106 kg  07/16/23 106.1 kg  BP Readings from Last 3 Encounters:  07/23/23 (!) 168/83  07/22/23 132/75  07/16/23 128/88   Pulse Readings from Last 3 Encounters:  07/23/23 90  07/22/23 76   07/16/23 69     PROVIDERS: Gerrit Heck, DO is PCP [facility]) - Serena Colonel, MD is ENT [facility] Care Everywhere)- Serena Croissant, MD is HEM-ONC - Lonie Peak, MD is RAD-ONC - Melody Comas, MD is pulmonologist. Previously she saw Sandrea Hughs, MD.  Verne Carrow, MD is cardiologist. Seen by Manson Passey, PA 10/01/17 for dyspnea evaluation. PRN f/u recommended after testing (see below). Hoy Finlay, MD is GI Pamelia Hoit, Vinay,MD is HEM   LABS: Most recent labs results in [facility] include: Lab Results  Component Value Date   WBC 7.4 05/23/2023   HGB 13.9 05/23/2023   HCT 41.0 05/23/2023   PLT 329 05/23/2023   GLUCOSE 249 (H) 05/23/2023   ALT 33 05/23/2023   AST 51 (H) 05/23/2023   NA 138 05/23/2023   K 3.5 05/23/2023   CL 105 05/23/2023   CREATININE 1.00 05/23/2023   BUN 20 05/23/2023   CO2 19 (L) 05/23/2023   TSH 0.167 (L) 03/16/2023   HGBA1C 7.1 (A) 07/16/2023     OTHER: Colonoscopy 07/22/23: Impression: - Two 3 to 6 mm polyps in the proximal rectum and in the transverse colon, removed with a cold snare.  Resected and retrieved. - Diverticulosis in the left colon and in the right colon. - Internal hemorrhoids. - The examination was otherwise normal on direct and retroflexion views.  Walk Tests: 08/21/17   Walked RA x one lap @ 185 stopped due to  Sob with nl sats / rapid pace  09/18/17   Walked RA  2 laps @ 185 ft each stopped due to  Sob / no desats / slow pace     IMAGES: CXR 05/21/23: FINDINGS: - Cardiomediastinal silhouette unchanged in size and contour. No evidence of central vascular congestion. No interlobular septal thickening. - No pneumothorax or pleural effusion. Coarsened interstitial markings, with no new confluent airspace disease. - Opacity overlying the medial left paraspinal region, unchanged from the comparison and compatible with complete left lower lobe collapse. - No acute displaced fracture.  Degenerative changes of the spine. IMPRESSION: Persisting left lower lobe collapse with no new finding.       EKG:  EKG 08/08/22: Normal sinus rhythm Possible Left atrial enlargement Left ventricular hypertrophy ( R in aVL , Sokolow-Lyon , Cornell product , Romhilt-Estes ) - She has had T wave inversion on high lateral leads on previous EKG tracings.   EKG 05/11/21: Normal sinus rhythm Possible Left atrial enlargement Cannot rule out Anterior infarct , age undetermined ST & T wave abnormality, consider inferolateral ischemia Abnormal ECG no significant change when compared to prior on Aug 2022 Confirmed by Marianna Fuss ([id number]) on 05/11/2021 9:01:37 AM - Baseline wanderer present     CV: Echo 08/09/22: IMPRESSIONS   1. Maximal thickness assessed 2 cm (anterior PSAX imaging). No LVOT  Obstruction. Left ventricular ejection fraction, by estimation, is 70 to  75%. The left ventricle has hyperdynamic function. The left ventricle has  no regional wall motion abnormalities.   There is severe concentric left ventricular hypertrophy. Left ventricular  diastolic parameters are consistent with Grade I diastolic dysfunction  (impaired relaxation).   2. Right ventricular systolic function is hyperdynamic. The right  ventricular size is normal. There is moderately elevated pulmonary artery  systolic pressure. The estimated right ventricular  systolic pressure is  45.8 mmHg.   3. A small pericardial effusion is present. The pericardial effusion is  posterior to the left ventricle.   4. The mitral valve is grossly normal. No evidence of mitral valve  regurgitation. No evidence of mitral stenosis.   5. TR best assesed in parasternal imaging; discrepant finding on apical  imaging. Tricuspid valve regurgitation is moderate at least.   6. 2D Vena Contracta 4 mm. Central aortic regurgitation. The aortic valve  is tricuspid. Aortic valve regurgitation is moderate. Aortic regurgitation  PHT  measures 336 msec.  - Comparison(s): Tricuspid regurgitation may have increased from 2019  (report only). PA peak pressure 35 mmHg 08/31/17. - Conclusion(s)/Recommendation(s): If clinically indicated, consider  outpatient assessment of hypertrophy ([facility] imaging vs CMR).    Nuclear stress test 10/07/17: Nuclear stress EF: 61%. There was no ST segment deviation noted during stress. Defect 1: There is a medium defect of moderate severity present in the mid anteroseptal and apex location. The study is normal. This is a low risk study. The left ventricular ejection fraction is normal (55-65%).  Normal stress nuclear study with breast attenuation but no ischemia; EF 61 with normal wall motion.     Past Medical History:  Diagnosis Date   Anemia    h/o of   Anxiety    Arthritis    in knees   Asthma    Breast cancer (HCC)    Bronchitis    Carpal tunnel syndrome 03/16/2018   Chronic pain 01/20/2012   Patient with chronic pain at stoma site. Seen by ENT (Dr. [name]) who recommends pain management. 12/22/11. She is currently on Vicodin and fentanyl.   04/07/13: denies stoma pain. Currently on flexeril, mobic and tramadol for b/l shoulder and neck pains .   COPD (chronic obstructive pulmonary disease) (HCC)    Depression    Diabetes mellitus without complication (HCC)    Diverticulosis 11/15/2012   noted on screening colonoscopy    DOE (dyspnea on exertion) 04/28/2014   Esophageal stricture    FB bronchus    Former smoker 03/19/2011   GERD (gastroesophageal reflux disease)    Heart murmur    asymptomatic    History of laryngectomy    History of radiation therapy 11/15/18- 12/06/18   Right Breast total dose 42.56 Gy in 16 fractions.    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   Leukocytopenia    Nausea alone 07/28/2013   Neck pain 01/21/2012   Normal MRI 07/14/2011   negative for mestasis    Plantar fasciitis 06/06/2016   Pneumonia 2012   RUQ abdominal pain 08/08/2022   On exam patient with marked tenderness to percussion and palpation RUQ. Obesity hinder palpation  PLan   Sciatica    Seizures (HCC) 06/29/2011   07/24/11 off Effexor w/o seizure   Sepsis (HCC) 08/04/2012   Sinusitis, chronic 07/20/2011   Bilateral maxillary, identified on MRI of head 07/14/11.     Tracheostomy dependent [facility])     Past Surgical History:  Procedure Laterality Date   BREAST LUMPECTOMY Right 07/06/2018   BREAST LUMPECTOMY WITH RADIOACTIVE SEED LOCALIZATION Right 07/06/2018   Procedure: RIGHT BREAST LUMPECTOMY WITH RADIOACTIVE SEED LOCALIZATION;  Surgeon: Emelia Loron, MD;  Location: [facility] OR;  Service: General;  Laterality: Right;   COLONOSCOPY N/A 11/15/2012   Procedure: COLONOSCOPY;  Surgeon: Hart Carwin, MD;  Location: [facility];  Service: Endoscopy;  Laterality: N/A;   CRYOTHERAPY  05/11/2021   Procedure: CRYOTHERAPY;  Surgeon: Josephine Igo, DO;  [facility];  Service: Pulmonary;;   DENTAL RESTORATION/EXTRACTION WITH X-RAY     ESOPHAGEAL DILATION N/A 09/09/2019   Procedure: ESOPHAGEAL DILATION;  Surgeon: Serena Colonel, MD;  Location: [facility];  Service: ENT;  Laterality: N/A;   ESOPHAGEAL DILATION N/A 10/05/2019   Procedure: ESOPHAGEAL DILATION;  Surgeon: Serena Colonel, MD;  Location: [facility];  Service: ENT;  Laterality: N/A;  via tracheostomy   ESOPHAGEAL DILATION N/A 02/27/2020   Procedure: ESOPHAGEAL DILATION;  Surgeon: Serena Colonel, MD;  Location: [facility];  Service: ENT;  Laterality: N/A;   ESOPHAGOGASTRODUODENOSCOPY (EGD) WITH PROPOFOL N/A 08/12/2018   Procedure: ESOPHAGOGASTRODUODENOSCOPY (EGD) WITH PROPOFOL;   Surgeon: Sherrilyn Rist, MD;  Location: [facility] ENDOSCOPY;  Service: Gastroenterology;  Laterality: N/A;   ESOPHAGOSCOPY  06/21/2012   Procedure: ESOPHAGOSCOPY;  Surgeon: Serena Colonel, MD;  Location: [facility];  Service: ENT;  Laterality: N/A;   ESOPHAGOSCOPY N/A 06/24/2021   Procedure: ESOPHAGOSCOPY WITH DILATATION;  Surgeon: Serena Colonel, MD;  Location: [facility];  Service: ENT;  Laterality: N/A;   ESOPHAGOSCOPY N/A 02/14/2022   Procedure: ESOPHAGOSCOPY WITH DILATATION;  Surgeon: Serena Colonel, MD;  Location: [facility] OR;  Service: ENT;  Laterality: N/A;   ESOPHAGOSCOPY N/A 08/11/2022   Procedure: ESOPHAGOSCOPY WITH DILATATION;  Surgeon: Serena Colonel, MD;  Location: [facility] OR;  Service: ENT;  Laterality: N/A;   ESOPHAGOSCOPY WITH DILITATION N/A 09/21/2014   Procedure: ESOPHAGOSCOPY WITH DILITATION;  Surgeon: Serena Colonel, MD;  Location: [facility] OR;  Service: ENT;  Laterality: N/A;   ESOPHAGOSCOPY WITH DILITATION N/A 07/04/2016   Procedure: ESOPHAGOSCOPY WITH DILITATION;  Surgeon: Serena Colonel, MD;  Location: [facility] OR;  Service: ENT;  Laterality: N/A;   ESOPHAGOSCOPY WITH DILITATION N/A 12/01/2017   Procedure: ESOPHAGOSCOPY WITH DILITATION;  Surgeon: Serena Colonel, MD;  Location: [facility] OR;  Service: ENT;  Laterality: N/A;   ESOPHAGOSCOPY WITH DILITATION N/A 04/19/2018   Procedure: ESOPHAGOSCOPY WITH DILITATION;  Surgeon: Serena Colonel, MD;  Location: [facility] OR;  Service: ENT;  Laterality: N/A;   ESOPHAGOSCOPY WITH DILITATION N/A 03/07/2019   Procedure: Esophagoscopy with dilatation;  Surgeon: Serena Colonel, MD;  Location: [facility];  Service: ENT;  Laterality: N/A;   ESOPHAGOSCOPY WITH DILITATION N/A 07/16/2020   Procedure: ESOPHAGOSCOPY WITH DILATION;  Surgeon: Serena Colonel, MD;  Location: [facility];  Service: ENT;  Laterality: N/A;   ESOPHAGOSCOPY WITH DILITATION N/A 10/08/2020   Procedure: ESOPHAGOSCOPY With Dilation;  Surgeon: Serena Colonel, MD;  Location:  SURGERY  CENTER;  Service: ENT;  Laterality: N/A;  21 French to 51 french   FLEXIBLE BRONCHOSCOPY  01/08/2018       FOREIGN BODY REMOVAL  05/11/2021   Procedure: FOREIGN BODY REMOVAL;  Surgeon: Josephine Igo, DO;  [facility];  Service: Pulmonary;;   FOREIGN BODY REMOVAL BRONCHIAL  10/02/2011   Procedure: REMOVAL FOREIGN BODY BRONCHIAL;  Surgeon: Melvenia Beam, MD;  Location: [facility] OR;  Service: ENT;  Laterality: N/A;   FOREIGN BODY REMOVAL BRONCHIAL N/A 01/08/2018   Procedure: REMOVAL FOREIGN BODY BRONCHIAL;  Surgeon: Flo Shanks, MD;  Location: [facility]S;  Service: ENT;  Laterality: N/A;  HEMOSTASIS CONTROL  05/11/2021   Procedure: HEMOSTASIS CONTROL;  Surgeon: Josephine Igo, DO;  [facility];  Service: Pulmonary;;   LARYNGECTOMY     Porta cath removal     PORTACATH PLACEMENT  09/17/10   Tip in cavoatrial junction   RE-EXCISION OF BREAST CANCER,SUPERIOR MARGINS Right 07/29/2018   Procedure: RE-EXCISION OF RIGHT BREAST MEDIAL MARGINS;  Surgeon: Emelia Loron, MD;  Location: [facility] OR;  Service: General;  Laterality: Right;   RIGID ESOPHAGOSCOPY N/A 03/19/2019   Procedure: FLEXIBLE ESOPHAGOSCOPY;  Surgeon: Flo Shanks, MD;  Location: [facility] OR;  Service: ENT;  Laterality: N/A;   STOMAPLASTY N/A 10/21/2016   Procedure: Benancio Deeds;  Surgeon: Serena Colonel, MD;  Location: [facility] OR;  Service: ENT;  Laterality: N/A;   TOTAL KNEE ARTHROPLASTY Right 12/12/2019   Procedure: RIGHT TOTAL KNEE ARTHROPLASTY;  Surgeon: Tarry Kos, MD;  [facility];  Service: Orthopedics;  Laterality: Right;   TOTAL KNEE ARTHROPLASTY Left 04/01/2021   Procedure: LEFT TOTAL KNEE ARTHROPLASTY;  Surgeon: Tarry Kos, MD;  [facility];  Service: Orthopedics;  Laterality: Left;   TRACHEAL DILITATION  07/16/2011   Procedure: TRACHEAL DILITATION;  Surgeon: [name], MD;  [facility];  Service: ENT;  Laterality: N/A;  dilation of tracheal stoma and replacement of stoma tube   TRACHEAL ESOPHAGEAL  PROSTHESIS (TEP) CHANGE N/A 08/11/2022   Procedure: TRACHEAL ESOPHAGEAL PROSTHESIS (TEP) CHANGE;  Surgeon: Serena Colonel, MD;  Location: [facility] OR;  Service: ENT;  Laterality: N/A;  ATOS TEP SET   TUBAL LIGATION  1982   VIDEO BRONCHOSCOPY Bilateral 05/11/2021   Procedure: VIDEO BRONCHOSCOPY WITHOUT FLUORO;  Surgeon: Josephine Igo, DO;  [facility];  Service: Pulmonary;  Laterality: Bilateral;  w/ cryotherapy, Foreign body extraction    MEDICATIONS: No current facility-administered medications for this encounter.    budesonide (PULMICORT) 0.5 MG/2ML nebulizer solution   cetirizine (EQ ALLERGY RELIEF, CETIRIZINE,) 10 MG tablet   escitalopram (LEXAPRO) 20 MG tablet   fluticasone (FLONASE) 50 MCG/ACT nasal spray   formoterol (PERFOROMIST) 20 MCG/2ML nebulizer solution   insulin aspart (NOVOLOG) 100 UNIT/ML injection   insulin glargine (SEMGLEE) 100 UNIT/ML injection   ipratropium (ATROVENT) 0.03 % nasal spray   levothyroxine (SYNTHROID) 137 MCG tablet   losartan-hydrochlorothiazide (HYZAAR) 100-12.5 MG tablet   montelukast (SINGULAIR) 10 MG tablet   nystatin cream (MYCOSTATIN)   ondansetron (ZOFRAN) 4 MG tablet   revefenacin (YUPELRI) 175 MCG/3ML nebulizer solution   rosuvastatin (CRESTOR) 40 MG tablet   Blood Glucose Monitoring Suppl (ACCU-CHEK GUIDE) w/Device KIT   carvedilol (COREG) 12.5 MG tablet   Continuous Glucose Receiver (DEXCOM G7 RECEIVER) DEVI   Continuous Glucose Sensor (DEXCOM G7 SENSOR) MISC   Humidifiers MISC   insulin glargine (LANTUS) 100 UNIT/ML Solostar Pen   Insulin Pen Needle (BD PEN NEEDLE NANO 2ND GEN) 32G X 4 MM MISC   ipratropium-albuterol (DUONEB) 0.5-2.5 (3) MG/3ML SOLN   metFORMIN (GLUCOPHAGE-XR) 500 MG 24 hr tablet   sodium chloride HYPERTONIC 3 % nebulizer solution    Deborah Chock, PA-C Surgical Short Stay/Anesthesiology [facility] Phone [phone] [facility] Phone [phone] 08/04/2023 3:15 PM

## 2023-08-04 NOTE — Progress Notes (Addendum)
SDW CALL  Patient was given pre-op instructions over the phone. The opportunity was given for the patient to ask questions. No further questions asked. Patient verbalized understanding of instructions given.   PCP - Gerrit Heck Cardiologist - denies Pulmonologist - Dr. Francine Graven  PPM/ICD - denies   Chest x-ray - 08/2022 EKG - DOS Stress Test - 09/2017 ECHO - 08/09/22 Cardiac Cath - denies  Sleep Study - denies CPAP -   Fasting Blood Sugar - 184 Pt wears a dexcom on her left arm  Last dose of GLP1 agonist-  n/a GLP1 instructions: n/a  Blood Thinner Instructions: n/a Aspirin Instructions: n/a  ERAS Protcol - NPO PRE-SURGERY Ensure or G2- n/a  COVID TEST- n/a   Anesthesia review: yes  Patient denies shortness of breath, fever, cough and chest pain over the phone call   All instructions explained to the patient, with a verbal understanding of the material. Patient agrees to go over the instructions while at home for a better understanding.    Patient instructed to take 1/2 of her Novolog is blood sugar is over 220 and instructed to take half of her usual dose of Insulin Glargine.

## 2023-08-05 ENCOUNTER — Encounter (HOSPITAL_COMMUNITY): Payer: Self-pay | Admitting: Otolaryngology

## 2023-08-05 ENCOUNTER — Encounter (HOSPITAL_COMMUNITY)
Admission: RE | Disposition: A | Discharge status: Home / Self Care | Payer: Self-pay | Source: Home / Self Care | Attending: Otolaryngology

## 2023-08-05 ENCOUNTER — Ambulatory Visit (HOSPITAL_BASED_OUTPATIENT_CLINIC_OR_DEPARTMENT_OTHER): Payer: 59 | Admitting: Vascular Surgery

## 2023-08-05 ENCOUNTER — Ambulatory Visit (HOSPITAL_COMMUNITY)
Admission: RE | Admit: 2023-08-05 | Discharge: 2023-08-05 | Disposition: A | Discharge status: Home / Self Care | Payer: 59 | Attending: Otolaryngology | Admitting: Otolaryngology

## 2023-08-05 ENCOUNTER — Ambulatory Visit (HOSPITAL_COMMUNITY): Payer: 59 | Admitting: Vascular Surgery

## 2023-08-05 DIAGNOSIS — Z7984 Long term (current) use of oral hypoglycemic drugs: Secondary | ICD-10-CM | POA: Insufficient documentation

## 2023-08-05 DIAGNOSIS — K222 Esophageal obstruction: Secondary | ICD-10-CM | POA: Diagnosis not present

## 2023-08-05 DIAGNOSIS — I119 Hypertensive heart disease without heart failure: Secondary | ICD-10-CM | POA: Insufficient documentation

## 2023-08-05 DIAGNOSIS — I082 Rheumatic disorders of both aortic and tricuspid valves: Secondary | ICD-10-CM | POA: Insufficient documentation

## 2023-08-05 DIAGNOSIS — I3139 Other pericardial effusion (noninflammatory): Secondary | ICD-10-CM | POA: Insufficient documentation

## 2023-08-05 DIAGNOSIS — F418 Other specified anxiety disorders: Secondary | ICD-10-CM | POA: Insufficient documentation

## 2023-08-05 DIAGNOSIS — R011 Cardiac murmur, unspecified: Secondary | ICD-10-CM | POA: Insufficient documentation

## 2023-08-05 DIAGNOSIS — Z8249 Family history of ischemic heart disease and other diseases of the circulatory system: Secondary | ICD-10-CM | POA: Insufficient documentation

## 2023-08-05 DIAGNOSIS — E119 Type 2 diabetes mellitus without complications: Secondary | ICD-10-CM | POA: Insufficient documentation

## 2023-08-05 DIAGNOSIS — J4489 Other specified chronic obstructive pulmonary disease: Secondary | ICD-10-CM | POA: Insufficient documentation

## 2023-08-05 DIAGNOSIS — Z9002 Acquired absence of larynx: Secondary | ICD-10-CM | POA: Diagnosis not present

## 2023-08-05 DIAGNOSIS — Z6836 Body mass index (BMI) 36.0-36.9, adult: Secondary | ICD-10-CM | POA: Insufficient documentation

## 2023-08-05 DIAGNOSIS — I509 Heart failure, unspecified: Secondary | ICD-10-CM | POA: Diagnosis not present

## 2023-08-05 DIAGNOSIS — E669 Obesity, unspecified: Secondary | ICD-10-CM | POA: Diagnosis not present

## 2023-08-05 DIAGNOSIS — K219 Gastro-esophageal reflux disease without esophagitis: Secondary | ICD-10-CM | POA: Insufficient documentation

## 2023-08-05 DIAGNOSIS — Z923 Personal history of irradiation: Secondary | ICD-10-CM | POA: Diagnosis not present

## 2023-08-05 DIAGNOSIS — Z794 Long term (current) use of insulin: Secondary | ICD-10-CM | POA: Insufficient documentation

## 2023-08-05 DIAGNOSIS — Z96653 Presence of artificial knee joint, bilateral: Secondary | ICD-10-CM | POA: Insufficient documentation

## 2023-08-05 DIAGNOSIS — E039 Hypothyroidism, unspecified: Secondary | ICD-10-CM | POA: Insufficient documentation

## 2023-08-05 DIAGNOSIS — Z79899 Other long term (current) drug therapy: Secondary | ICD-10-CM | POA: Diagnosis not present

## 2023-08-05 DIAGNOSIS — Z930 Tracheostomy status: Secondary | ICD-10-CM | POA: Diagnosis not present

## 2023-08-05 DIAGNOSIS — I110 Hypertensive heart disease with heart failure: Secondary | ICD-10-CM

## 2023-08-05 DIAGNOSIS — Z87891 Personal history of nicotine dependence: Secondary | ICD-10-CM | POA: Insufficient documentation

## 2023-08-05 DIAGNOSIS — J449 Chronic obstructive pulmonary disease, unspecified: Secondary | ICD-10-CM | POA: Diagnosis not present

## 2023-08-05 DIAGNOSIS — M1990 Unspecified osteoarthritis, unspecified site: Secondary | ICD-10-CM | POA: Diagnosis not present

## 2023-08-05 DIAGNOSIS — Z8521 Personal history of malignant neoplasm of larynx: Secondary | ICD-10-CM | POA: Insufficient documentation

## 2023-08-05 DIAGNOSIS — Z833 Family history of diabetes mellitus: Secondary | ICD-10-CM | POA: Diagnosis not present

## 2023-08-05 HISTORY — PX: ESOPHAGOSCOPY WITH DILITATION: SHX5618

## 2023-08-05 LAB — CBC
HCT: 41.1 % (ref 36.0–46.0)
Hemoglobin: 13.6 g/dL (ref 12.0–15.0)
MCH: 30.2 pg (ref 26.0–34.0)
MCHC: 33.1 g/dL (ref 30.0–36.0)
MCV: 91.1 fL (ref 80.0–100.0)
Platelets: 315 10*3/uL (ref 150–400)
RBC: 4.51 MIL/uL (ref 3.87–5.11)
RDW: 12.7 % (ref 11.5–15.5)
WBC: 8.6 10*3/uL (ref 4.0–10.5)
nRBC: 0 % (ref 0.0–0.2)

## 2023-08-05 LAB — SURGICAL PCR SCREEN
MRSA, PCR: POSITIVE — AB
Staphylococcus aureus: POSITIVE — AB

## 2023-08-05 LAB — GLUCOSE, CAPILLARY
Glucose-Capillary: 100 mg/dL — ABNORMAL HIGH (ref 70–99)
Glucose-Capillary: 114 mg/dL — ABNORMAL HIGH (ref 70–99)

## 2023-08-05 SURGERY — ESOPHAGOSCOPY, WITH DILATION
Anesthesia: General | Site: Esophagus

## 2023-08-05 MED ORDER — INSULIN ASPART 100 UNIT/ML IJ SOLN: 0.0000 [IU] | INTRAMUSCULAR | Status: DC | PRN

## 2023-08-05 MED ORDER — ORAL CARE MOUTH RINSE: 15.0000 mL | Freq: Once | OROMUCOSAL | Status: AC

## 2023-08-05 MED ORDER — LIDOCAINE 2% (20 MG/ML) 5 ML SYRINGE
INTRAMUSCULAR | Status: DC | PRN
  Administered 2023-08-05: 100 mg via INTRAVENOUS

## 2023-08-05 MED ORDER — PROPOFOL 10 MG/ML IV BOLUS
INTRAVENOUS | Status: AC
  Filled 2023-08-05: qty 20

## 2023-08-05 MED ORDER — 0.9 % SODIUM CHLORIDE (POUR BTL) OPTIME
TOPICAL | Status: DC | PRN
  Administered 2023-08-05: 1000 mL

## 2023-08-05 MED ORDER — DEXAMETHASONE SODIUM PHOSPHATE 10 MG/ML IJ SOLN
INTRAMUSCULAR | Status: DC | PRN
  Administered 2023-08-05: 5 mg via INTRAVENOUS

## 2023-08-05 MED ORDER — CHLORHEXIDINE GLUCONATE CLOTH 2 % EX PADS: 6.0000 | MEDICATED_PAD | Freq: Every day | CUTANEOUS | Status: DC

## 2023-08-05 MED ORDER — ALBUMIN HUMAN 5 % IV SOLN
12.5000 g | Freq: Once | INTRAVENOUS | Status: AC
  Administered 2023-08-05: 12.5 g via INTRAVENOUS

## 2023-08-05 MED ORDER — LACTATED RINGERS IV SOLN: INTRAVENOUS | Status: DC

## 2023-08-05 MED ORDER — ONDANSETRON HCL 4 MG/2ML IJ SOLN
INTRAMUSCULAR | Status: DC | PRN
  Administered 2023-08-05: 4 mg via INTRAVENOUS

## 2023-08-05 MED ORDER — CARVEDILOL 12.5 MG PO TABS
12.5000 mg | ORAL_TABLET | Freq: Once | ORAL | Status: AC
  Administered 2023-08-05: 12.5 mg via ORAL
  Filled 2023-08-05: qty 1

## 2023-08-05 MED ORDER — PROPOFOL 10 MG/ML IV BOLUS
INTRAVENOUS | Status: DC | PRN
  Administered 2023-08-05: 200 mg via INTRAVENOUS

## 2023-08-05 MED ORDER — ALBUMIN HUMAN 5 % IV SOLN
INTRAVENOUS | Status: AC
  Filled 2023-08-05: qty 250

## 2023-08-05 MED ORDER — MUPIROCIN 2 % EX OINT
1.0000 | TOPICAL_OINTMENT | Freq: Two times a day (BID) | CUTANEOUS | Status: DC
  Filled 2023-08-05: qty 22

## 2023-08-05 MED ORDER — MUPIROCIN 2 % EX OINT
TOPICAL_OINTMENT | CUTANEOUS | Status: AC
  Filled 2023-08-05: qty 22

## 2023-08-05 MED ORDER — CHLORHEXIDINE GLUCONATE 0.12 % MT SOLN
15.0000 mL | Freq: Once | OROMUCOSAL | Status: AC
  Administered 2023-08-05: 15 mL via OROMUCOSAL
  Filled 2023-08-05: qty 15

## 2023-08-05 SURGICAL SUPPLY — 25 items
BAG COUNTER SPONGE SURGICOUNT (BAG) ×1 IMPLANT
BALLN PULM 15 16.5 18X75 (BALLOONS) ×1
BALLOON PULM 15 16.5 18X75 (BALLOONS) ×1 IMPLANT
BLADE SURG 15 STRL LF DISP TIS (BLADE) IMPLANT
CANISTER SUCT 3000ML PPV (MISCELLANEOUS) ×1 IMPLANT
CNTNR URN SCR LID CUP LEK RST (MISCELLANEOUS) IMPLANT
COVER BACK TABLE 60X90IN (DRAPES) ×1 IMPLANT
DRAPE HALF SHEET 40X57 (DRAPES) ×1 IMPLANT
GAUZE 4X4 16PLY ~~LOC~~+RFID DBL (SPONGE) IMPLANT
GAUZE SPONGE 4X4 12PLY STRL (GAUZE/BANDAGES/DRESSINGS) IMPLANT
GLOVE BIO SURGEON STRL SZ7.5 (GLOVE) ×1 IMPLANT
GUARD TEETH (MISCELLANEOUS) IMPLANT
KIT BASIN OR (CUSTOM PROCEDURE TRAY) ×1 IMPLANT
KIT TURNOVER KIT B (KITS) ×1 IMPLANT
NDL PRECISIONGLIDE 27X1.5 (NEEDLE) IMPLANT
NEEDLE PRECISIONGLIDE 27X1.5 (NEEDLE)
NS IRRIG 1000ML POUR BTL (IV SOLUTION) ×1 IMPLANT
PAD ARMBOARD 7.5X6 YLW CONV (MISCELLANEOUS) ×2 IMPLANT
PATTIES SURGICAL .5 X3 (DISPOSABLE) IMPLANT
POSITIONER HEAD DONUT 9IN (MISCELLANEOUS) IMPLANT
SOL ANTI FOG 6CC (MISCELLANEOUS) IMPLANT
SURGILUBE 2OZ TUBE FLIPTOP (MISCELLANEOUS) ×1 IMPLANT
TOWEL GREEN STERILE FF (TOWEL DISPOSABLE) ×2 IMPLANT
TUBE CONNECTING 12X1/4 (SUCTIONS) ×1 IMPLANT
WATER STERILE IRR 1000ML POUR (IV SOLUTION) ×1 IMPLANT

## 2023-08-05 NOTE — Transfer of Care (Signed)
Immediate Anesthesia Transfer of Care Note  Patient: Barb Merino Bauers  Procedure(s) Performed: ESOPHAGOSCOPY WITH DILITATION (Esophagus)  Patient Location: PACU  Anesthesia Type:General  Level of Consciousness: awake, alert , and oriented  Airway & Oxygen Therapy: Patient Spontanous Breathing  Post-op Assessment: Report given to RN  Post vital signs: Reviewed and stable  Last Vitals:  Vitals Value Taken Time  BP 76/51 08/05/23 1045  Temp 37.1 C 08/05/23 1039  Pulse 75 08/05/23 1045  Resp 24 08/05/23 1045  SpO2 81 % 08/05/23 1045  Vitals shown include unfiled device data.  Last Pain:  Vitals:   08/05/23 0901  TempSrc:   PainSc: 0-No pain         Complications: No notable events documented.

## 2023-08-05 NOTE — Anesthesia Postprocedure Evaluation (Signed)
Anesthesia Post Note  Patient: Deborah [name]  Procedure(s) Performed: ESOPHAGOSCOPY WITH DILITATION (Esophagus)     Patient location during evaluation: PACU Anesthesia Type: General Level of consciousness: awake and alert Pain management: pain level controlled Vital Signs Assessment: post-procedure vital signs reviewed and stable Respiratory status: spontaneous breathing, nonlabored ventilation and respiratory function stable Cardiovascular status: blood pressure returned to baseline and stable Postop Assessment: no apparent nausea or vomiting Anesthetic complications: no  No notable events documented.  Last Vitals:  Vitals:   08/05/23 1145 08/05/23 1200  BP: 124/73 139/88  Pulse: 77 79  Resp: 14 (!) 22  Temp:  36.9 C  SpO2: 95% 98%    Last Pain:  Vitals:   08/05/23 0901  TempSrc:   PainSc: 0-No pain                 [name],W. EDMOND

## 2023-08-05 NOTE — Progress Notes (Signed)
BMP hemolyzed per lab. Pt ready for OR when lab called. Per Dr. Sampson Goon, pt ok to proceed without re-draw.   Michael Boston, RN

## 2023-08-05 NOTE — Anesthesia Procedure Notes (Signed)
Procedure Name: Intubation Date/Time: 08/05/2023 10:25 AM  Performed by: Bartholomew Crews, CRNAPre-anesthesia Checklist: Patient identified, Emergency Drugs available, Suction available, Patient being monitored and Timeout performed Patient Re-evaluated:Patient Re-evaluated prior to induction Oxygen Delivery Method: Circle system utilized Preoxygenation: Pre-oxygenation with 100% oxygen Induction Type: IV induction and Tracheostomy Ventilation: Unable to mask ventilate Number of attempts: 1 Airway Equipment and Method: Tracheostomy

## 2023-08-05 NOTE — H&P (Signed)
Deborah [name] is an 68 y.o. female.   Chief Complaint: Difficulty swallowing HPI: History of total laryngectomy years ago, with postop radiation.  Intermittent difficulty swallowing requiring esophageal dilatation.  Past Medical History:  Diagnosis Date   Anemia    h/o of   Anxiety    Arthritis    in knees   Asthma    Breast cancer (HCC)    Bronchitis    Carpal tunnel syndrome 03/16/2018   Chronic pain 01/20/2012   Patient with chronic pain at stoma site. Seen by ENT (Dr. [name]) who recommends pain management. 12/22/11. She is currently on Vicodin and fentanyl.   04/07/13: denies stoma pain. Currently on flexeril, mobic and tramadol for b/l shoulder and neck pains .   COPD (chronic obstructive pulmonary disease) (HCC)    Depression    Diabetes mellitus without complication (HCC)    Diverticulosis 11/15/2012   noted on screening colonoscopy    DOE (dyspnea on exertion) 04/28/2014   Esophageal stricture    FB bronchus    Former smoker 03/19/2011   GERD (gastroesophageal reflux disease)    Heart murmur    asymptomatic    History of laryngectomy    History of radiation therapy 11/15/18- 12/06/18   Right Breast total dose 42.56 Gy in 16 fractions.    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   Leukocytopenia    Nausea alone 07/28/2013   Neck pain 01/21/2012   Normal MRI 07/14/2011   negative for mestasis    Plantar fasciitis 06/06/2016   Pneumonia 2012   RUQ abdominal pain 08/08/2022   On exam patient with marked tenderness to percussion and palpation RUQ. Obesity hinder palpation  PLan   Sciatica    Seizures (HCC) 06/29/2011   07/24/11 off Effexor w/o seizure   Sepsis (HCC)  08/04/2012   Sinusitis, chronic 07/20/2011   Bilateral maxillary, identified on MRI of head 07/14/11.     Tracheostomy dependent [facility])     Past Surgical History:  Procedure Laterality Date   BREAST LUMPECTOMY Right 07/06/2018   BREAST LUMPECTOMY WITH RADIOACTIVE SEED LOCALIZATION Right 07/06/2018   Procedure: RIGHT BREAST LUMPECTOMY WITH RADIOACTIVE SEED LOCALIZATION;  Surgeon: Emelia Loron, MD;  Location: [facility] OR;  Service: General;  Laterality: Right;   COLONOSCOPY N/A 11/15/2012   Procedure: COLONOSCOPY;  Surgeon: Hart Carwin, MD;  Location: [facility];  Service: Endoscopy;  Laterality: N/A;   CRYOTHERAPY  05/11/2021   Procedure: CRYOTHERAPY;  Surgeon: Josephine Igo, DO;  Location: [facility];  Service: Pulmonary;;   DENTAL RESTORATION/EXTRACTION WITH X-RAY     ESOPHAGEAL DILATION N/A 09/09/2019   Procedure: ESOPHAGEAL DILATION;  Surgeon: Serena Colonel, MD;  Location: [facility];  Service: ENT;  Laterality: N/A;   ESOPHAGEAL DILATION N/A 10/05/2019   Procedure: ESOPHAGEAL DILATION;  Surgeon: Serena Colonel, MD;  Location: [facility];  Service: ENT;  Laterality: N/A;  via tracheostomy   ESOPHAGEAL DILATION N/A 02/27/2020   Procedure: ESOPHAGEAL DILATION;  Surgeon: Serena Colonel, MD;  Location: [facility];  Service: ENT;  Laterality: N/A;   ESOPHAGOGASTRODUODENOSCOPY (EGD) WITH PROPOFOL N/A 08/12/2018   Procedure: ESOPHAGOGASTRODUODENOSCOPY (EGD) WITH PROPOFOL;  Surgeon: Charlie Pitter  III, MD;  [facility]: [facility];  Service: Gastroenterology;  Laterality: N/A;   ESOPHAGOSCOPY  06/21/2012   Procedure: ESOPHAGOSCOPY;  Surgeon: Serena Colonel, MD;  Location: [facility];  Service: ENT;  Laterality: N/A;   ESOPHAGOSCOPY N/A 06/24/2021   Procedure: ESOPHAGOSCOPY WITH DILATATION;  Surgeon: Serena Colonel, MD;  Location: [facility];  Service: ENT;  Laterality: N/A;   ESOPHAGOSCOPY N/A 02/14/2022   Procedure: ESOPHAGOSCOPY WITH  DILATATION;  Surgeon: Serena Colonel, MD;  Location: [facility] OR;  Service: ENT;  Laterality: N/A;   ESOPHAGOSCOPY N/A 08/11/2022   Procedure: ESOPHAGOSCOPY WITH DILATATION;  Surgeon: Serena Colonel, MD;  Location: [facility] OR;  Service: ENT;  Laterality: N/A;   ESOPHAGOSCOPY WITH DILITATION N/A 09/21/2014   Procedure: ESOPHAGOSCOPY WITH DILITATION;  Surgeon: Serena Colonel, MD;  Location: [facility] OR;  Service: ENT;  Laterality: N/A;   ESOPHAGOSCOPY WITH DILITATION N/A 07/04/2016   Procedure: ESOPHAGOSCOPY WITH DILITATION;  Surgeon: Serena Colonel, MD;  Location: [facility] OR;  Service: ENT;  Laterality: N/A;   ESOPHAGOSCOPY WITH DILITATION N/A 12/01/2017   Procedure: ESOPHAGOSCOPY WITH DILITATION;  Surgeon: Serena Colonel, MD;  Location: [facility] OR;  Service: ENT;  Laterality: N/A;   ESOPHAGOSCOPY WITH DILITATION N/A 04/19/2018   Procedure: ESOPHAGOSCOPY WITH DILITATION;  Surgeon: Serena Colonel, MD;  Location: [facility] OR;  Service: ENT;  Laterality: N/A;   ESOPHAGOSCOPY WITH DILITATION N/A 03/07/2019   Procedure: Esophagoscopy with dilatation;  Surgeon: Serena Colonel, MD;  Location: [facility];  Service: ENT;  Laterality: N/A;   ESOPHAGOSCOPY WITH DILITATION N/A 07/16/2020   Procedure: ESOPHAGOSCOPY WITH DILATION;  Surgeon: Serena Colonel, MD;  Location: [facility];  Service: ENT;  Laterality: N/A;   ESOPHAGOSCOPY WITH DILITATION N/A 10/08/2020   Procedure: ESOPHAGOSCOPY With Dilation;  Surgeon: Serena Colonel, MD;  Location: [facility];  Service: ENT;  Laterality: N/A;  21 French to 51 french   FLEXIBLE BRONCHOSCOPY  01/08/2018       FOREIGN BODY REMOVAL  05/11/2021   Procedure: FOREIGN BODY REMOVAL;  Surgeon: Josephine Igo, DO;  [facility];  Service: Pulmonary;;   FOREIGN BODY REMOVAL BRONCHIAL  10/02/2011   Procedure: REMOVAL FOREIGN BODY BRONCHIAL;  Surgeon: Melvenia Beam, MD;  Location: [facility] OR;  Service: ENT;  Laterality: N/A;   FOREIGN BODY REMOVAL BRONCHIAL N/A 01/08/2018   Procedure:  REMOVAL FOREIGN BODY BRONCHIAL;  Surgeon: Flo Shanks, MD;  Location: [facility];  Service: ENT;  Laterality: N/A;   HEMOSTASIS CONTROL  05/11/2021   Procedure: HEMOSTASIS CONTROL;  Surgeon: Josephine Igo, DO;  [facility];  Service: Pulmonary;;   LARYNGECTOMY     Porta cath removal     PORTACATH PLACEMENT  09/17/10   Tip in cavoatrial junction   RE-EXCISION OF BREAST CANCER,SUPERIOR MARGINS Right 07/29/2018   Procedure: RE-EXCISION OF RIGHT BREAST MEDIAL MARGINS;  Surgeon: Emelia Loron, MD;  Location: [facility] OR;  Service: General;  Laterality: Right;   RIGID ESOPHAGOSCOPY N/A 03/19/2019   Procedure: FLEXIBLE ESOPHAGOSCOPY;  Surgeon: Flo Shanks, MD;  Location: [facility] OR;  Service: ENT;  Laterality: N/A;   STOMAPLASTY N/A 10/21/2016   Procedure: Benancio Deeds;  Surgeon: Serena Colonel, MD;  Location: [facility] OR;  Service: ENT;  Laterality: N/A;   TOTAL KNEE ARTHROPLASTY Right 12/12/2019   Procedure: RIGHT TOTAL KNEE ARTHROPLASTY;  Surgeon: Tarry Kos, MD;  Location: [facility] OR;  Service: Orthopedics;  Laterality: Right;   TOTAL KNEE ARTHROPLASTY Left 04/01/2021   Procedure: LEFT TOTAL KNEE ARTHROPLASTY;  Surgeon: Tarry Kos, MD;  Location: [facility] OR;  Service: Orthopedics;  Laterality: Left;   TRACHEAL DILITATION  07/16/2011   Procedure: TRACHEAL DILITATION;  Surgeon: [name], MD;  [facility];  Service: ENT;  Laterality: N/A;  dilation of tracheal stoma and replacement of stoma tube   TRACHEAL ESOPHAGEAL PROSTHESIS (TEP) CHANGE N/A 08/11/2022   Procedure: TRACHEAL ESOPHAGEAL PROSTHESIS (TEP) CHANGE;  Surgeon: Serena Colonel, MD;  Location: [facility] OR;  Service: ENT;  Laterality: N/A;  ATOS TEP SET   TUBAL LIGATION  1982   VIDEO BRONCHOSCOPY Bilateral 05/11/2021   Procedure: VIDEO BRONCHOSCOPY WITHOUT FLUORO;  Surgeon: Josephine Igo, DO;  Location: [facility];  Service: Pulmonary;  Laterality: Bilateral;  w/ cryotherapy, Foreign body extraction    Family History  Problem Relation Age of  Onset   Heart disease Mother    Heart disease Father    Heart disease Sister    Cancer Sister        type unknown   Diabetes Sister    Cancer Brother        type unknown   Liver disease Maternal Grandmother    Breast cancer Neg Hx    Colon cancer Neg Hx    Esophageal cancer Neg Hx    Rectal cancer Neg Hx    Stomach cancer Neg Hx    Colon polyps Neg Hx    Social History:  reports that she quit smoking about 12 years ago. Her smoking use included cigarettes. She started smoking about 32 years ago. She has a 40 pack-year smoking history. She has never used smokeless tobacco. She reports current alcohol use. She reports that she does not use drugs.  Allergies:  Allergies  Allergen Reactions   Lisinopril Cough    Medications Prior to Admission  Medication Sig Dispense Refill   budesonide (PULMICORT) 0.5 MG/2ML nebulizer solution TAKE 2 ML (0.5 MG TOTAL) BY NEBULIZATION TWICE A DAY 120 mL 11   cetirizine (EQ ALLERGY RELIEF, CETIRIZINE,) 10 MG tablet Take 1 tablet (10 mg total) by mouth daily. 90 tablet 1   escitalopram (LEXAPRO) 20 MG tablet Take 1 tablet (20 mg total) by mouth daily. 90 tablet 1   fluticasone (FLONASE) 50 MCG/ACT nasal spray Place 1 spray into both nostrils daily. (Patient taking differently: Place 1 spray into both nostrils 2 (two) times daily.) 16 g 4   formoterol (PERFOROMIST) 20 MCG/2ML nebulizer solution Take 2 mLs (20 mcg total) by nebulization 2 (two) times daily. 120 mL 6   insulin aspart (NOVOLOG) 100 UNIT/ML injection Inject 7 Units into the skin 3 (three) times daily with meals. 10 mL 6   insulin glargine (SEMGLEE) 100 UNIT/ML injection Inject 25 Units into the skin in the morning.     ipratropium (ATROVENT) 0.03 % nasal spray USE 2 SPRAY(S) IN EACH NOSTRIL EVERY 12 HOURS (Patient taking differently: Place 2 sprays into both nostrils daily.) 30 mL 0   levothyroxine (SYNTHROID) 137 MCG tablet Take 1 tablet (137 mcg total) by mouth daily before breakfast.  (Patient taking differently: Take 150 mcg by mouth daily before breakfast.) 30 tablet 0   losartan-hydrochlorothiazide (HYZAAR) 100-12.5 MG tablet Take 1 tablet by mouth daily.     montelukast (SINGULAIR) 10 MG tablet Take 1 tablet (10 mg total) by mouth at bedtime. 90 tablet 1   nystatin cream (MYCOSTATIN) Apply 1 Application topically daily as needed (rash under breast stomach).     ondansetron (ZOFRAN) 4 MG tablet Take 1 tablet (4  mg total) by mouth every 8 (eight) hours as needed for nausea or vomiting. 20 tablet 0   revefenacin (YUPELRI) 175 MCG/3ML nebulizer solution Take 3 mLs (175 mcg total) by nebulization daily. 90 mL 6   rosuvastatin (CRESTOR) 40 MG tablet Take 1 tablet (40 mg total) by mouth daily. (Patient taking differently: Take 5 mg by mouth daily.) 30 tablet 3   Blood Glucose Monitoring Suppl (ACCU-CHEK GUIDE) w/Device KIT Use in the morning, noon, and at bedtime to check blood glucose. 1 kit 0   carvedilol (COREG) 12.5 MG tablet Take 1 tablet (12.5 mg total) by mouth 2 (two) times daily with a meal. (Patient not taking: Reported on 08/03/2023) 180 tablet 3   Continuous Glucose Receiver (DEXCOM G7 RECEIVER) DEVI USE AS DIRECTED 1 each 0   Continuous Glucose Sensor (DEXCOM G7 SENSOR) MISC 1 Device by Does not apply route as directed. Apply new sensor every 10 days 3 each 11   Humidifiers MISC 1 application by Tracheal Tube route as needed. 1 each 0   insulin glargine (LANTUS) 100 UNIT/ML Solostar Pen Inject 24 Units into the skin daily. (Patient not taking: Reported on 08/03/2023) 15 mL 1   Insulin Pen Needle (BD PEN NEEDLE NANO 2ND GEN) 32G X 4 MM MISC USE AS DIRECTED FOUR TIMES DAILY WITH INSULIN PEN. 100 each 2   ipratropium-albuterol (DUONEB) 0.5-2.5 (3) MG/3ML SOLN TAKE 3 MLS BY NEBULIZATION EVERY 6 (SIX) HOURS. (Patient not taking: Reported on 08/03/2023) 360 mL 3   metFORMIN (GLUCOPHAGE-XR) 500 MG 24 hr tablet Take 1 tablet by mouth once daily with breakfast (Patient not taking:  Reported on 08/03/2023) 30 tablet 0   sodium chloride HYPERTONIC 3 % nebulizer solution Take by nebulization 3 (three) times daily. (Patient not taking: Reported on 08/03/2023) 750 mL 5    No results found. However, due to the size of the patient record, not all encounters were searched. Please check Results Review for a complete set of results. No results found.  ROS: otherwise negative  There were no vitals taken for this visit.  PHYSICAL EXAM: Overall appearance:  Healthy appearing, in no distress Head:  Normocephalic, atraumatic. Ears: External auditory canals are clear; tympanic membranes are intact and the middle ears are free of any effusion. Nose: External nose is healthy in appearance. Internal nasal exam free of any lesions or obstruction. Oral Cavity/pharynx:  There are no mucosal lesions or masses identified. Neuro:  No identifiable neurologic deficits. Neck: No palpable neck masses.  Stoma is clear.  Studies Reviewed: none    Assessment/Plan Esophageal stricture.  Proceed with esophagoscopy and dilatation.  Serena Colonel 08/05/2023, 8:33 AM

## 2023-08-05 NOTE — Op Note (Signed)
OPERATIVE REPORT  DATE OF SURGERY: 08/05/2023  PATIENT:  Deborah [name],  68 y.o. female  PRE-OPERATIVE DIAGNOSIS:  Esophageal Stricture  POST-OPERATIVE DIAGNOSIS:  Esophageal Stricture  PROCEDURE:  Procedure(s): ESOPHAGOSCOPY WITH DILITATION  SURGEON:  [name], MD  ASSISTANTS: none  ANESTHESIA:   General   EBL: 0 ml  DRAINS: none  LOCAL MEDICATIONS USED:  None  SPECIMEN: None  COUNTS:  Correct  PROCEDURE DETAILS: The patient was taken to the operating room and placed on the operating table in the supine position. Following induction of general endotracheal anesthesia, via the tracheal stoma, the table was turned 90 and the patient was draped in a standard fashion.  The Jako laryngoscope was used to visualize the pharynx.  Maloney dilators were then used to dilate starting with a #30 and serially dilating up to a #50.  There is no bleeding.  Patient was then awakened extubated and transferred to recovery in stable condition.    PATIENT DISPOSITION:  To PACU, stable

## 2023-08-06 ENCOUNTER — Encounter (HOSPITAL_COMMUNITY): Payer: Self-pay | Admitting: Otolaryngology

## 2023-08-07 ENCOUNTER — Telehealth: Payer: Self-pay

## 2023-08-07 NOTE — Telephone Encounter (Signed)
Patient calls nurse line regarding issues with continued elevated blood sugar levels.   She reports that for the last several days her blood sugar has been running between 275-315.   She has been taking 24 units of Lantus in AM, along with 8 units of Novolog with meals.   She has been asymptomatic with elevated readings.   She has a follow up appointment with PCP on 08/13/23.   Please advise if patient should make any adjustments to insulin regimen in the meantime.   ED precautions discussed.   Veronda Prude, RN

## 2023-08-07 NOTE — Telephone Encounter (Signed)
Called patient and advised of dosage increase. Patient verbalizes understanding.   Return precautions discussed.   Veronda Prude, RN

## 2023-08-10 ENCOUNTER — Telehealth: Payer: Self-pay | Admitting: Pharmacist

## 2023-08-10 DIAGNOSIS — E109 Type 1 diabetes mellitus without complications: Secondary | ICD-10-CM

## 2023-08-10 MED ORDER — INSULIN ASPART 100 UNIT/ML IJ SOLN
10.0000 [IU] | Freq: Three times a day (TID) | INTRAMUSCULAR | Status: DC
Start: 2023-08-10 — End: 2023-08-13

## 2023-08-10 MED ORDER — INSULIN GLARGINE 100 UNIT/ML SOLOSTAR PEN: 30.0000 [IU] | PEN_INJECTOR | Freq: Every day | SUBCUTANEOUS | Status: DC

## 2023-08-10 NOTE — Telephone Encounter (Signed)
 Patient contacted for follow-up of elevated glucose readings.   Since last contact patient reports that she continues to have elevated glucose readings.  She reports being < 200 only once over the weekend.  She reports current reading 277 She also reports several readings >300 over the weekend.   Current Medications include:  Patient denies any significant medication related side effects. States feeling tired but not sick.   Medication Plan: -Continue Lantus (insulin glargine) at 28 units daily -Increased Humalog (insulin lispro) from ~ 8 to 10 units prior to meals.    Total time with patient call and documentation of interaction: 12 minutes.  F/U PCP on 2/27

## 2023-08-11 ENCOUNTER — Ambulatory Visit: Payer: Medicare HMO

## 2023-08-11 NOTE — Telephone Encounter (Signed)
 Reviewed and agree with Dr Macky Lower plan.

## 2023-08-12 ENCOUNTER — Ambulatory Visit
Admission: RE | Admit: 2023-08-12 | Discharge: 2023-08-12 | Disposition: A | Discharge status: Home / Self Care | Payer: 59 | Source: Ambulatory Visit

## 2023-08-12 DIAGNOSIS — Z1231 Encounter for screening mammogram for malignant neoplasm of breast: Secondary | ICD-10-CM

## 2023-08-13 ENCOUNTER — Ambulatory Visit (INDEPENDENT_AMBULATORY_CARE_PROVIDER_SITE_OTHER): Payer: 59 | Admitting: Family Medicine

## 2023-08-13 ENCOUNTER — Encounter: Payer: Self-pay | Admitting: Family Medicine

## 2023-08-13 ENCOUNTER — Ambulatory Visit (INDEPENDENT_AMBULATORY_CARE_PROVIDER_SITE_OTHER): Payer: Medicaid Other | Admitting: Pulmonary Disease

## 2023-08-13 ENCOUNTER — Encounter: Payer: Self-pay | Admitting: Pulmonary Disease

## 2023-08-13 VITALS — BP 122/73 | HR 80 | Ht 67.0 in | Wt 235.8 lb

## 2023-08-13 VITALS — BP 103/71 | HR 85 | Ht 67.0 in | Wt 236.0 lb

## 2023-08-13 DIAGNOSIS — Z930 Tracheostomy status: Secondary | ICD-10-CM

## 2023-08-13 DIAGNOSIS — R5383 Other fatigue: Secondary | ICD-10-CM | POA: Insufficient documentation

## 2023-08-13 DIAGNOSIS — E039 Hypothyroidism, unspecified: Secondary | ICD-10-CM | POA: Diagnosis not present

## 2023-08-13 DIAGNOSIS — Z794 Long term (current) use of insulin: Secondary | ICD-10-CM | POA: Diagnosis not present

## 2023-08-13 DIAGNOSIS — I152 Hypertension secondary to endocrine disorders: Secondary | ICD-10-CM | POA: Diagnosis not present

## 2023-08-13 DIAGNOSIS — E109 Type 1 diabetes mellitus without complications: Secondary | ICD-10-CM

## 2023-08-13 DIAGNOSIS — J449 Chronic obstructive pulmonary disease, unspecified: Secondary | ICD-10-CM

## 2023-08-13 DIAGNOSIS — Z122 Encounter for screening for malignant neoplasm of respiratory organs: Secondary | ICD-10-CM

## 2023-08-13 DIAGNOSIS — J4540 Moderate persistent asthma, uncomplicated: Secondary | ICD-10-CM

## 2023-08-13 MED ORDER — ROSUVASTATIN CALCIUM 40 MG PO TABS: 40.0000 mg | ORAL_TABLET | Freq: Every day | ORAL | 3 refills | Status: DC

## 2023-08-13 MED ORDER — CARVEDILOL 12.5 MG PO TABS
12.5000 mg | ORAL_TABLET | Freq: Two times a day (BID) | ORAL | 3 refills | Status: DC
Start: 2023-08-13 — End: 2024-03-15

## 2023-08-13 MED ORDER — INSULIN LISPRO (1 UNIT DIAL) 100 UNIT/ML (KWIKPEN): 10.0000 [IU] | PEN_INJECTOR | Freq: Three times a day (TID) | SUBCUTANEOUS | 3 refills | Status: DC

## 2023-08-13 MED ORDER — METFORMIN HCL ER 500 MG PO TB24: 500.0000 mg | ORAL_TABLET | Freq: Every day | ORAL | 0 refills | Status: DC

## 2023-08-13 MED ORDER — LOSARTAN POTASSIUM-HCTZ 100-12.5 MG PO TABS: 1.0000 | ORAL_TABLET | Freq: Every day | ORAL | 3 refills | Status: DC

## 2023-08-13 NOTE — Assessment & Plan Note (Signed)
 Patient has a history of hypothyroidism and it has been several months since her last TSH check, we will check this today and plan to adjust medications as necessary.  Will also check vitamin B12, folate and vitamin D levels.  Patient is up-to-date on mammograms and colonoscopy, however is overdue for low-dose CT scan to screen for lung cancer given her 40-pack-year smoking history.  Have ordered and scheduled this for her today we will follow-up on results when available.  Patient will follow-up with me on April 10 at which time we will review her results if we have not already.

## 2023-08-13 NOTE — Patient Instructions (Addendum)
 It was wonderful to see you today!  Today we followed up on your diabetes and COPD. I won't make any changes to your COPD regimen today, as you also saw your pulmonologist and he was happy with your current regimen.  For your diabetes, continue your current regimen and I will follow-up with you in April for your next A1c check.  For your fatigue we are checking a number of labs today to assess for common causes of fatigue.  We will check your TSH, CBC and several vitamin levels to look at your thyroid, check for bleeding after your recent esophageal stretching procedure, and make sure that there are no easily correctable things that we could be doing to help with your fatigue.  We should also get you scheduled for your lung cancer screening CT.  Given your history this could be an important cause of fatigue that we do not want to miss.  I will see you again on April 10, at 1:45pm.  Please call [phone] with any questions about today's appointment.   If you need any additional refills, please call your pharmacy before calling the office.  Gerrit Heck, DO Family Medicine

## 2023-08-13 NOTE — Assessment & Plan Note (Addendum)
 Reviewed patient's Dexcom log, her sugars have been improving steadily in the current regimen.  Average over the last 30 days was 199.  Will continue to monitor, next A1c check will be April 10.  Will adjust further at next visit.  Addendum: Received notice from patient's health plan that novolog is not the preferred insulin. Discontinued novolog and prescribed humalog kwikpen at same dosage listed earlier in the note. Will update patient via mychart.

## 2023-08-13 NOTE — Progress Notes (Addendum)
    SUBJECTIVE:   CHIEF COMPLAINT / HPI:   Diabetes and COPD follow up:  Current DM regimen: Metformin 500 mg once daily 28 u daily of lantus 10 u TID WC of lispro  COPD regimen per pulmonology today: Formoterol Budesonide Yupelri  Confirmed that these are the correct medications and reviewed medication list, removing medications that the patient is no longer taking and correcting dosages.   Also having fatigue.  Reports that this has been ongoing for several months but was previously manageable.  Over the last 2 weeks since her esophageal stretching procedure it has gotten worse to the point where if she sits still for more than 30 minutes she falls asleep.  She denies any chest pain shortness of breath, bloody or black stools.  Does have a history of both thyroidism, last TSH was low but she was lost to follow-up.  PERTINENT  PMH / PSH: Throat cancer, DM, COPD, hypothyroidism  OBJECTIVE:   BP 122/73   Pulse 80   Ht 5\' 7"  (1.702 m)   Wt 235 lb 12.8 oz (107 kg)   SpO2 95%   BMI 36.93 kg/m   General: A&O, NAD HEENT: No sign of trauma, EOM grossly intact Cardiac: RRR, no m/r/g Respiratory: CTAB, normal WOB, no w/c/r GI: Soft, NTTP, non-distended  Extremities: NTTP, no peripheral edema.   ASSESSMENT/PLAN:   COPD (chronic obstructive pulmonary disease) [facility]) Patient saw pulmonology earlier today with no changes to current regimen. Patient is currently stable, will follow up with Pulmonology  Ketosis-prone diabetes mellitus (HCC) Reviewed patient's Dexcom log, her sugars have been improving steadily in the current regimen.  Average over the last 30 days was 199.  Will continue to monitor, next A1c check will be April 10.  Will adjust further at next visit.  Addendum: Received notice from patient's health plan that novolog is not the preferred insulin. Discontinued novolog and prescribed humalog kwikpen at same dosage listed earlier in the note. Will update patient via  mychart.  Hypertension associated with diabetes (HCC) Refilled Coreg 12.5 mg, and losartan HCTZ 100-12.5 mg pressure at goal  Hypothyroidism Will check TSH today, plan to follow-up in approximately 6 weeks on 4/10 for follow-up and A1c check.  Will call patient with results prior to this and adjust medications as necessary.  Fatigue Patient has a history of hypothyroidism and it has been several months since her last TSH check, we will check this today and plan to adjust medications as necessary.  Will also check vitamin B12, folate and vitamin D levels.  Patient is up-to-date on mammograms and colonoscopy, however is overdue for low-dose CT scan to screen for lung cancer given her 40-pack-year smoking history.  Have ordered and scheduled this for her today we will follow-up on results when available.  Patient will follow-up with me on April 10 at which time we will review her results if we have not already.   Other medications which were refilled at this visit include rosuvastatin 40 mg.  Gerrit Heck, DO [facility]

## 2023-08-13 NOTE — Patient Instructions (Addendum)
 Continue formoterol nebs twice daily  Continue budesonide nebs twice daily  Continue yupelri neb daily  Use duoneb nebulizer treatment as needed  Follow up in 6 months, call sooner if needed

## 2023-08-13 NOTE — Assessment & Plan Note (Signed)
 Patient saw pulmonology earlier today with no changes to current regimen. Patient is currently stable, will follow up with Pulmonology

## 2023-08-13 NOTE — Assessment & Plan Note (Signed)
 Refilled Coreg 12.5 mg, and losartan HCTZ 100-12.5 mg pressure at goal

## 2023-08-13 NOTE — Progress Notes (Unsigned)
 Synopsis: Referred in December 2022 for Foreign Body in Bronchus by Dr. [name]  Subjective:   PATIENT ID: Deborah [name] GENDER: female DOB: 12-23-1955, MRN: [id number]  HPI  Chief Complaint  Patient presents with   Follow-up    Pt states she's been having issues w/ her diabetes, say she was running in 200/300. 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   She experiences significant breathing difficulties, which led to a change in her nebulizer regimen by her primary care physician. She was previously on budesonide and duonebs, and now she is on brovana and yupelri nebs in place of duonebs. Her breathing has improved since the change.  She recalls a previous incident in December when a steroid taper led to elevated blood sugar levels, resulting in an emergency room visit. Currently, her blood sugar remains high. No specific symptoms related to her high blood sugar, but she is uncomfortable with the elevated levels. She has a visit with her primary care team after this to review her blood sugar regimen.  She feels tired all the time, which she attributes to the new medication regimen. She lacks energy, often needing to lie down and rest, and even simple tasks like cooking take longer due to fatigue.  OV 05/21/23 She presents with increased coughing and fatigue. They report difficulty getting up, describing it as a struggle that takes about fifteen to twenty minutes. The patient is currently on DuoNeb nebulizer solution, budesonide nebulizer, and hypertonic saline nebulizers, but reports that these treatments are not alleviating their symptoms. They also mention taking Mucinex, but do not feel it is working. The patient is not currently on any antibiotics.  In addition to these symptoms, the patient reports a rapid weight gain of about ten pounds  in a week. They have noticed swelling in their knees and feet, which they initially attributed to their diabetes. The patient also mentions having a recent episode of breathlessness while sitting down, which was alarming for them as it was the first time they experienced such an episode.  The patient also reports issues with swallowing and a prosthesis. They mention that they have been unable to swallow meat and have been waiting for a scheduled swallowing test since February. They also report frequent issues with their prosthesis, including holes forming due to coughing and the prosthesis coming out.  OV 05/21/23 She has increased cough and shortness of breath and trouble clearing mucous secretions due to fistula closure following TEP prosthesis expulsion in late January. She is scheduled with Dr. [name] 2/26 for replacement of TEP prosthesis. She continues on nebulizer regimen but is having much harder time clearing her secretions with out prosthesis in place. She does report coughing up some blood. Denies fevers, chills or sweats.   OV 01/21/22 She has been doing well since last visit and moving to an all nebulizer regimen with budesonide and duonebs. She continues to have thick mucous plugs at times though on a daily basis. She is also using hypertonic nebs twice daily.   OV 09/17/21 She went to the ER on 3/16 with chest pains that were worse with movements. She had CTA Chest done which was negative for PE. She had diffuse mosaic attenuation and persistent collapse of the left lower lobe.   She reports her allergies are acting up and she has been having  more wheezing recently. She denies increased mucous production at this time.   OV 07/16/21 She called to be seen sooner due to coughing spells of recent that wake her from her sleep. She has significant coughing spell that will lead to shortness of breath and difficulty clearing mucous. She denies fevers, chills or sweats.  She was started on flonase  nasal spray and ipratropium nasal spray at last visit with some improvement in her sinus congestion but she continues to have a lot of mucous. She is using dulera and spiriva inhalers along with duoneb nebulizer treatments.   The mucous is clear to whitish with occasional blood tinged streaks.   Chest x-ray from 05/17/21 shows persistent left lower lobe atelectasis.   OV 05/17/21 Patient seen by her ENT, Dr. [name] with CT scan on 11/17 showing obstructing foreign body in the left mainstem bronchus with total post-obstructive atelectasis of lower lung. Appointment setup for today for further evaluation but she became symptomatic and presented to the ED on 11/26. She underwent bronchoscopy with foreign body retrieval with Dr. Tonia Brooms on 11/26. 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 She recovered well post-procedure and was then sent home from the ED.   She was seen yesterday by Dr. [name] with reported improvement in her breathing but continuing to have cough with mucous production.   She has been doing well since her hospital visit.  She continues to have cough with sputum production.  She also complains of significant postnasal drainage, more so than normal.  She denies any issues with aspiration.  She is being scheduled for esophagram and dilation.  Past Medical History:  Diagnosis Date   Anemia    h/o of   Anxiety    Arthritis    in knees   Asthma    Breast cancer (HCC)    Bronchitis    Carpal tunnel syndrome 03/16/2018   Chronic pain 01/20/2012   Patient with chronic pain at stoma site. Seen by ENT (Dr. [name]) who recommends pain management. 12/22/11. She is currently on Vicodin and fentanyl.   04/07/13: denies stoma pain. Currently on flexeril, mobic and tramadol for b/l shoulder and neck pains .   COPD (chronic obstructive pulmonary disease) (HCC)    Depression    Diabetes mellitus without complication (HCC)    Diverticulosis 11/15/2012   noted on screening colonoscopy     DOE (dyspnea on exertion) 04/28/2014   Esophageal stricture    FB bronchus    Former smoker 03/19/2011   GERD (gastroesophageal reflux disease)    Heart murmur    asymptomatic    History of laryngectomy    History of radiation therapy 11/15/18- 12/06/18   Right Breast total dose 42.56 Gy in 16 fractions.    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   Leukocytopenia    Nausea alone 07/28/2013   Neck pain 01/21/2012   Normal MRI 07/14/2011   negative for mestasis    Plantar fasciitis 06/06/2016   Pneumonia 2012   RUQ abdominal pain 08/08/2022   On exam patient with marked tenderness to percussion and palpation RUQ. Obesity hinder palpation  PLan   Sciatica    Seizures (HCC) 06/29/2011  07/24/11 off Effexor w/o seizure   Sepsis (HCC) 08/04/2012   Sinusitis, chronic 07/20/2011   Bilateral maxillary, identified on MRI of head 07/14/11.     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HCC)      Family History  Problem Relation Age of Onset   Heart disease Mother    Heart disease Father    Heart disease Sister    Cancer Sister        type unknown   Diabetes Sister    Cancer Brother        type unknown   Liver disease Maternal Grandmother    Breast cancer Neg Hx    Colon cancer Neg Hx    Esophageal cancer Neg Hx    Rectal cancer Neg Hx    Stomach cancer Neg Hx    Colon polyps Neg Hx      Social History   Socioeconomic History   Marital status: Married    Spouse name: Not on file   Number of children: 2   Years of education: 12   Highest education level: Not on file  Occupational History   Occupation: part time    Comment: warehouse work  Tobacco Use   Smoking status: Former    Current  packs/day: 0.00    Average packs/day: 2.0 packs/day for 20.0 years (40.0 ttl pk-yrs)    Types: Cigarettes    Start date: 09/17/1990    Quit date: 09/17/2010    Years since quitting: 12.9   Smokeless tobacco: Never  Vaping Use   Vaping status: Never Used  Substance and Sexual Activity   Alcohol use: Yes    Comment: Socially drinks    Drug use: No    Comment: tried cocaine 1 time 2010 only used 1 time   Sexual activity: Yes    Birth control/protection: Surgical  Other Topics Concern   Not on file  Social History Narrative   Lives with boyfriend   Social Drivers of Health   Financial Resource Strain: Not on file  Food Insecurity: Low Risk  (08/19/2022)   [facility]   Hunger Vital Sign    Worried About Running Out of Food in the Last Year: Never true    Ran Out of Food in the Last Year: Never true  Transportation Needs: No Transportation Needs (08/19/2022)   [facility]   Transportation    In the past 12 months, has lack of reliable transportation kept you from medical appointments, meetings, work or from getting things needed for daily living? : No  Physical Activity: Not on file  Stress: Not on file  Social Connections: Not on file  Intimate Partner Violence: Not At Risk (08/09/2022)   Humiliation, Afraid, Rape, and Kick questionnaire    Fear of Current or Ex-Partner: No    Emotionally Abused: No    Physically Abused: No    Sexually Abused: No     Allergies  Allergen Reactions   Lisinopril Cough     Outpatient Medications Prior to Visit  Medication Sig Dispense Refill   budesonide (PULMICORT) 0.5 MG/2ML nebulizer solution TAKE 2 ML (0.5 MG TOTAL) BY NEBULIZATION TWICE A DAY 120 mL 11   cetirizine (EQ ALLERGY RELIEF, CETIRIZINE,) 10 MG tablet Take 1 tablet (10 mg total) by mouth daily. 90 tablet 1   Continuous Glucose Receiver (DEXCOM G7 RECEIVER) DEVI USE AS DIRECTED 1 each 0   Continuous Glucose Sensor (DEXCOM  G7 SENSOR) MISC 1 Device by Does not  apply route as directed. Apply new sensor every 10 days 3 each 11   escitalopram (LEXAPRO) 20 MG tablet Take 1 tablet (20 mg total) by mouth daily. 90 tablet 1   fluticasone (FLONASE) 50 MCG/ACT nasal spray Place 1 spray into both nostrils daily. (Patient taking differently: Place 1 spray into both nostrils 2 (two) times daily.) 16 g 4   formoterol (PERFOROMIST) 20 MCG/2ML nebulizer solution Take 2 mLs (20 mcg total) by nebulization 2 (two) times daily. 120 mL 6   Humidifiers MISC 1 application by Tracheal Tube route as needed. 1 each 0   insulin glargine (LANTUS) 100 UNIT/ML Solostar Pen Inject 30 Units into the skin daily.     Insulin Pen Needle (BD PEN NEEDLE NANO 2ND GEN) 32G X 4 MM MISC USE AS DIRECTED FOUR TIMES DAILY WITH INSULIN PEN. 100 each 2   levothyroxine (SYNTHROID) 137 MCG tablet Take 1 tablet (137 mcg total) by mouth daily before breakfast. (Patient taking differently: Take 150 mcg by mouth daily before breakfast.) 30 tablet 0   montelukast (SINGULAIR) 10 MG tablet Take 1 tablet (10 mg total) by mouth at bedtime. 90 tablet 1   nystatin cream (MYCOSTATIN) Apply 1 Application topically daily as needed (rash under breast stomach).     ondansetron (ZOFRAN) 4 MG tablet Take 1 tablet (4 mg total) by mouth every 8 (eight) hours as needed for nausea or vomiting. 20 tablet 0   revefenacin (YUPELRI) 175 MCG/3ML nebulizer solution Take 3 mLs (175 mcg total) by nebulization daily. 90 mL 6   sodium chloride HYPERTONIC 3 % nebulizer solution Take by nebulization 3 (three) times daily. 750 mL 5   Blood Glucose Monitoring Suppl (ACCU-CHEK GUIDE) w/Device KIT Use in the morning, noon, and at bedtime to check blood glucose. 1 kit 0   carvedilol (COREG) 12.5 MG tablet Take 1 tablet (12.5 mg total) by mouth 2 (two) times daily with a meal. 180 tablet 3   insulin aspart (NOVOLOG) 100 UNIT/ML injection Inject 10 Units into the skin 3 (three) times daily with meals.      insulin glargine (SEMGLEE) 100 UNIT/ML injection Inject 25 Units into the skin in the morning.     ipratropium (ATROVENT) 0.03 % nasal spray USE 2 SPRAY(S) IN EACH NOSTRIL EVERY 12 HOURS (Patient taking differently: Place 2 sprays into both nostrils daily.) 30 mL 0   ipratropium-albuterol (DUONEB) 0.5-2.5 (3) MG/3ML SOLN TAKE 3 MLS BY NEBULIZATION EVERY 6 (SIX) HOURS. 360 mL 3   losartan-hydrochlorothiazide (HYZAAR) 100-12.5 MG tablet Take 1 tablet by mouth daily.     metFORMIN (GLUCOPHAGE-XR) 500 MG 24 hr tablet Take 1 tablet by mouth once daily with breakfast 30 tablet 0   rosuvastatin (CRESTOR) 40 MG tablet Take 1 tablet (40 mg total) by mouth daily. (Patient taking differently: Take 5 mg by mouth daily.) 30 tablet 3   No facility-administered medications prior to visit.   Review of Systems  Constitutional:  Positive for malaise/fatigue. Negative for chills, fever and weight loss.  HENT:  Negative for congestion, sinus pain and sore throat.   Eyes: Negative.   Respiratory:  Positive for cough, sputum production and shortness of breath. Negative for hemoptysis and wheezing.   Cardiovascular:  Negative for chest pain, palpitations, orthopnea, claudication and leg swelling.  Gastrointestinal:  Negative for abdominal pain, heartburn, nausea and vomiting.  Genitourinary: Negative.   Musculoskeletal:  Negative for joint pain and myalgias.  Skin:  Negative for rash.  Neurological:  Negative for weakness.  Endo/Heme/Allergies: Negative.   Psychiatric/Behavioral: Negative.      Objective:   Vitals:   08/13/23 1258  BP: 103/71  Pulse: 85  SpO2: 92%  Weight: 236 lb (107 kg)  Height: 5\' 7"  (1.702 m)   Physical Exam Constitutional:      General: She is not in acute distress.    Appearance: She is not ill-appearing.  HENT:     Head: Normocephalic and atraumatic.     Mouth/Throat:     Comments: Tracheostomy in place, no erythema or secretions noted Cardiovascular:     Rate and Rhythm:  Normal rate and regular rhythm.     Pulses: Normal pulses.     Heart sounds: Normal heart sounds. No murmur heard. Pulmonary:     Effort: Pulmonary effort is normal.     Breath sounds: No wheezing, rhonchi or rales.  Musculoskeletal:     Right lower leg: No edema.     Left lower leg: No edema.  Skin:    General: Skin is warm and dry.  Neurological:     General: No focal deficit present.     Mental Status: She is alert.     CBC    Component Value Date/Time   WBC 8.6 08/05/2023 0915   RBC 4.51 08/05/2023 0915   HGB 13.6 08/05/2023 0915   HGB 12.8 08/14/2022 1209   HGB 11.9 08/20/2015 1200   HCT 41.1 08/05/2023 0915   HCT 38.5 08/14/2022 1209   HCT 37.3 08/20/2015 1200   PLT 315 08/05/2023 0915   PLT 395 08/14/2022 1209   MCV 91.1 08/05/2023 0915   MCV 88 08/14/2022 1209   MCV 91.8 08/20/2015 1200   MCH 30.2 08/05/2023 0915   MCHC 33.1 08/05/2023 0915   RDW 12.7 08/05/2023 0915   RDW 11.9 08/14/2022 1209   RDW 14.4 08/20/2015 1200   LYMPHSABS 1.6 05/23/2023 1502   LYMPHSABS 1.2 08/20/2015 1200   MONOABS 0.4 05/23/2023 1502   MONOABS 0.3 08/20/2015 1200   EOSABS 0.0 05/23/2023 1502   EOSABS 0.2 08/20/2015 1200   BASOSABS 0.1 05/23/2023 1502   BASOSABS 0.1 08/20/2015 1200      Latest Ref Rng & Units 05/23/2023    3:23 PM 05/23/2023    3:16 PM 05/23/2023    3:02 PM  BMP  Glucose 70 - 99 mg/dL  381  829   BUN 8 - 23 mg/dL  20  19   Creatinine 9.37 - 1.00 mg/dL  1.69  6.78   Sodium 938 - 145 mmol/L 138  139  134   Potassium 3.5 - 5.1 mmol/L 3.5  3.5  3.7   Chloride 98 - 111 mmol/L  105  103   CO2 22 - 32 mmol/L   19   Calcium 8.9 - 10.3 mg/dL   9.3    Chest imaging: CTA Chest 08/29/21 1. No evidence for acute pulmonary embolism. 2. Persistent complete atelectasis of the left lower lobe with hyperexpansion of the left upper lobe. Central occlusion of the left lower lobe bronchus is unchanged from previous exam. Residual, retained foreign body or endobronchial  lesion not excluded. Advise referral to pulmonary medicine for further management. 3. Diffuse mosaic attenuation pattern throughout both lungs likely reflects air trapping secondary to small airways disease. 4. Hepatic steatosis. Contour the liver appears slightly irregular which may reflect early cirrhosis. 5. Unchanged appearance of borderline enlarged left axillary lymph node.  CXR 05/17/21 Persistent complete atelectasis of the left lower lobe. Lung volumes are normal.  No consolidative airspace disease. No pleural effusions. No pneumothorax. No pulmonary nodule or mass noted. Pulmonary vasculature and the cardiomediastinal silhouette are within normal limits.  CT Chest 05/02/21 1. There is an obstructing foreign body at the origin of the left mainstem bronchus, with total postobstructive atelectasis of the left lower lobe. This may be an aspirated tracheoesophageal speech valve. 2. Tracheostomy. 3. Hepatic steatosis.  PFT:     No data to display          Labs:  Path:  Echo 08/31/17: LVEF 65 to 70%.  Grade 2 diastolic dysfunction.  Normal size and function.  Heart Catheterization:  Assessment & Plan:   Moderate persistent asthma without complication  Tracheostomy dependence (HCC)  Discussion: Deborah [name] is a 68 year old woman, former smoker with laryngeal cancer s/p laryngectomy who returns to pulmonary clinic for asthma/COPD.  Asthma/COPD Improved symptoms with new long-acting nebulizer regimen. No recent exacerbations requiring steroids. -Continue current long-acting nebulizer regimen: budesonide, yupelri and brovana -Follow-up in 6 months or sooner if symptoms worsen. -caution with steroid tapers in the future due to her diabetes  Allergies - continue zyrtec and montelukast - continue fluticasone nasal spray - ipratropium nasal spray PRN  Follow-up in 6 months.  Melody Comas, MD [facility] Office:  [phone]    Current Outpatient Medications:    budesonide (PULMICORT) 0.5 MG/2ML nebulizer solution, TAKE 2 ML (0.5 MG TOTAL) BY NEBULIZATION TWICE A DAY, Disp: 120 mL, Rfl: 11   cetirizine (EQ ALLERGY RELIEF, CETIRIZINE,) 10 MG tablet, Take 1 tablet (10 mg total) by mouth daily., Disp: 90 tablet, Rfl: 1   Continuous Glucose Receiver (DEXCOM G7 RECEIVER) DEVI, USE AS DIRECTED, Disp: 1 each, Rfl: 0   Continuous Glucose Sensor (DEXCOM G7 SENSOR) MISC, 1 Device by Does not apply route as directed. Apply new sensor every 10 days, Disp: 3 each, Rfl: 11   escitalopram (LEXAPRO) 20 MG tablet, Take 1 tablet (20 mg total) by mouth daily., Disp: 90 tablet, Rfl: 1   fluticasone (FLONASE) 50 MCG/ACT nasal spray, Place 1 spray into both nostrils daily. (Patient taking differently: Place 1 spray into both nostrils 2 (two) times daily.), Disp: 16 g, Rfl: 4   formoterol (PERFOROMIST) 20 MCG/2ML nebulizer solution, Take 2 mLs (20 mcg total) by nebulization 2 (two) times daily., Disp: 120 mL, Rfl: 6   Humidifiers MISC, 1 application by Tracheal Tube route as needed., Disp: 1 each, Rfl: 0   insulin glargine (LANTUS) 100 UNIT/ML Solostar Pen, Inject 30 Units into the skin daily., Disp: , Rfl:    Insulin Pen Needle (BD PEN NEEDLE NANO 2ND GEN) 32G X 4 MM MISC, USE AS DIRECTED FOUR TIMES DAILY WITH INSULIN PEN., Disp: 100 each, Rfl: 2   levothyroxine (SYNTHROID) 137 MCG tablet, Take 1 tablet (137 mcg total) by mouth daily before breakfast. (Patient taking differently: Take 150 mcg by mouth daily before breakfast.), Disp: 30 tablet, Rfl: 0   montelukast (SINGULAIR) 10 MG tablet, Take 1 tablet (10 mg total) by mouth at bedtime., Disp: 90 tablet, Rfl: 1   nystatin cream (MYCOSTATIN), Apply 1 Application topically daily as needed (rash under breast stomach)., Disp: , Rfl:    ondansetron (ZOFRAN) 4 MG tablet, Take 1 tablet (4 mg total) by mouth every 8 (eight) hours as needed for nausea or vomiting., Disp: 20 tablet,  Rfl: 0   revefenacin (YUPELRI) 175 MCG/3ML nebulizer solution, Take 3 mLs (175 mcg total) by nebulization daily., Disp: 90 mL, Rfl: 6  sodium chloride HYPERTONIC 3 % nebulizer solution, Take by nebulization 3 (three) times daily., Disp: 750 mL, Rfl: 5   carvedilol (COREG) 12.5 MG tablet, Take 1 tablet (12.5 mg total) by mouth 2 (two) times daily with a meal., Disp: 180 tablet, Rfl: 3   insulin lispro (HUMALOG KWIKPEN) 100 UNIT/ML KwikPen, Inject 10 Units into the skin 3 (three) times daily., Disp: 15 mL, Rfl: 3   losartan-hydrochlorothiazide (HYZAAR) 100-12.5 MG tablet, Take 1 tablet by mouth daily., Disp: 30 tablet, Rfl: 3   metFORMIN (GLUCOPHAGE-XR) 500 MG 24 hr tablet, Take 1 tablet (500 mg total) by mouth daily with breakfast., Disp: 30 tablet, Rfl: 0   rosuvastatin (CRESTOR) 40 MG tablet, Take 1 tablet (40 mg total) by mouth daily., Disp: 30 tablet, Rfl: 3

## 2023-08-13 NOTE — Assessment & Plan Note (Signed)
>>  ASSESSMENT AND PLAN FOR KETOSIS-PRONE DIABETES MELLITUS (HCC) WRITTEN ON 08/13/2023  3:57 PM BY Sharaine Delange, DO  Reviewed patient's Dexcom log, her sugars have been improving steadily in the current regimen.  Average over the last 30 days was 199.  Will continue to monitor, next A1c check will be April 10.  Will adjust further at next visit.  Addendum: Received notice from patient's health plan that novolog  is not the preferred insulin . Discontinued novolog  and prescribed humalog  kwikpen at same dosage listed earlier in the note. Will update patient via mychart.

## 2023-08-13 NOTE — Addendum Note (Signed)
 Addended by: Gerrit Heck on: 08/13/2023 03:58 PM   Modules accepted: Orders

## 2023-08-13 NOTE — Assessment & Plan Note (Signed)
 Will check TSH today, plan to follow-up in approximately 6 weeks on 4/10 for follow-up and A1c check.  Will call patient with results prior to this and adjust medications as necessary.

## 2023-08-14 ENCOUNTER — Telehealth: Payer: Self-pay | Admitting: Family Medicine

## 2023-08-14 ENCOUNTER — Encounter: Payer: Self-pay | Admitting: Pulmonary Disease

## 2023-08-14 ENCOUNTER — Telehealth: Payer: Self-pay | Admitting: Pharmacist

## 2023-08-14 DIAGNOSIS — E109 Type 1 diabetes mellitus without complications: Secondary | ICD-10-CM

## 2023-08-14 DIAGNOSIS — E039 Hypothyroidism, unspecified: Secondary | ICD-10-CM

## 2023-08-14 LAB — CBC
Hematocrit: 40.8 % (ref 34.0–46.6)
Hemoglobin: 13.4 g/dL (ref 11.1–15.9)
MCH: 30.5 pg (ref 26.6–33.0)
MCHC: 32.8 g/dL (ref 31.5–35.7)
MCV: 93 fL (ref 79–97)
Platelets: 371 10*3/uL (ref 150–450)
RBC: 4.4 x10E6/uL (ref 3.77–5.28)
RDW: 12.4 % (ref 11.7–15.4)
WBC: 7.1 10*3/uL (ref 3.4–10.8)

## 2023-08-14 LAB — FOLATE: Folate: 4.1 ng/mL (ref >3.0)

## 2023-08-14 LAB — VITAMIN D 25 HYDROXY (VIT D DEFICIENCY, FRACTURES): Vit D, 25-Hydroxy: 19.2 ng/mL — ABNORMAL LOW (ref 30.0–100.0)

## 2023-08-14 LAB — TSH: TSH: 7.59 u[IU]/mL — ABNORMAL HIGH (ref 0.450–4.500)

## 2023-08-14 LAB — VITAMIN B12: Vitamin B-12: 579 pg/mL (ref 232–1245)

## 2023-08-14 MED ORDER — LEVOTHYROXINE SODIUM 175 MCG PO TABS: 175.0000 ug | ORAL_TABLET | Freq: Every day | ORAL | 3 refills | Status: DC

## 2023-08-14 MED ORDER — INSULIN LISPRO (1 UNIT DIAL) 100 UNIT/ML (KWIKPEN): 12.0000 [IU] | PEN_INJECTOR | Freq: Three times a day (TID) | SUBCUTANEOUS | Status: DC

## 2023-08-14 MED ORDER — INSULIN GLARGINE 100 UNIT/ML SOLOSTAR PEN
30.0000 [IU] | PEN_INJECTOR | Freq: Every day | SUBCUTANEOUS | Status: DC
Start: 2023-08-14 — End: 2023-09-24

## 2023-08-14 NOTE — Telephone Encounter (Signed)
 Called the patient's phone number and left HIPAA compliant voice message regarding her test results.  Her TSH is elevated to greater than 7.  Will plan to adjust her levothyroxine accordingly.  She is currently on 137 mcg daily will increase the dose to 175 mcg and retest in 6 weeks at her appointment on 4/10.

## 2023-08-14 NOTE — Telephone Encounter (Signed)
 Patient contacted for follow-up of glucose control  (Contacted after PCP visit today)  Since last contact patient reports continued glucose readings > 250 with some readings reported as high as low 300s.  She denies any readings < 150 We discussed elevations are likely due to stress of recent illness and are likely to improve as she continues to feel better.   Medication Plan: -Lantus increased from 28 to 30 units once daily -Increased Novolog from 10 to 12 units prior to meals 3x daily  Plan to see patient in office IF readings remain > low 200s by Monday 3/3 Asked patient to call our office to schedule if readings remain uncontrolled.   Total time with patient call and documentation of interaction: 12 minutes.

## 2023-08-14 NOTE — Telephone Encounter (Signed)
 Reviewed and agree with Dr Macky Lower plan.

## 2023-08-17 NOTE — Telephone Encounter (Signed)
 Patient returns call to nurse line. Advised of message per provider.   Patient has no questions at this time.   Veronda Prude, RN

## 2023-08-18 ENCOUNTER — Telehealth: Payer: Self-pay | Admitting: Pharmacist

## 2023-08-18 NOTE — Telephone Encounter (Signed)
 Attempted to contact patient for follow-up of glucose control.   Left HIPAA compliant voice mail requesting call back to direct phone: [phone]  Total time with patient call and documentation of interaction: 4 minutes.

## 2023-08-20 DIAGNOSIS — Z930 Tracheostomy status: Secondary | ICD-10-CM | POA: Diagnosis not present

## 2023-08-20 DIAGNOSIS — C321 Malignant neoplasm of supraglottis: Secondary | ICD-10-CM | POA: Diagnosis not present

## 2023-08-20 DIAGNOSIS — M1712 Unilateral primary osteoarthritis, left knee: Secondary | ICD-10-CM | POA: Diagnosis not present

## 2023-08-20 DIAGNOSIS — Z96652 Presence of left artificial knee joint: Secondary | ICD-10-CM | POA: Diagnosis not present

## 2023-08-20 DIAGNOSIS — C329 Malignant neoplasm of larynx, unspecified: Secondary | ICD-10-CM | POA: Diagnosis not present

## 2023-08-21 ENCOUNTER — Ambulatory Visit (HOSPITAL_COMMUNITY)
Admission: RE | Admit: 2023-08-21 | Discharge: 2023-08-21 | Disposition: A | Discharge status: Home / Self Care | Payer: 59 | Source: Ambulatory Visit | Attending: Family Medicine | Admitting: Family Medicine

## 2023-08-21 DIAGNOSIS — Z122 Encounter for screening for malignant neoplasm of respiratory organs: Secondary | ICD-10-CM | POA: Diagnosis not present

## 2023-08-21 DIAGNOSIS — Z87891 Personal history of nicotine dependence: Secondary | ICD-10-CM | POA: Insufficient documentation

## 2023-08-21 DIAGNOSIS — F17210 Nicotine dependence, cigarettes, uncomplicated: Secondary | ICD-10-CM | POA: Diagnosis not present

## 2023-08-23 ENCOUNTER — Other Ambulatory Visit: Payer: Self-pay | Admitting: Family Medicine

## 2023-08-23 DIAGNOSIS — J449 Chronic obstructive pulmonary disease, unspecified: Secondary | ICD-10-CM

## 2023-08-24 ENCOUNTER — Other Ambulatory Visit (HOSPITAL_COMMUNITY): Payer: Self-pay

## 2023-08-26 ENCOUNTER — Telehealth: Payer: Self-pay | Admitting: Pharmacist

## 2023-08-26 ENCOUNTER — Encounter: Payer: Self-pay | Admitting: Family Medicine

## 2023-08-26 NOTE — Telephone Encounter (Signed)
 Attempted to contact patient for follow-up of Diabetes   Left HIPAA compliant voice mail requesting call back to direct phone: [phone]  Total time with patient call and documentation of interaction: 4 minutes.

## 2023-08-27 ENCOUNTER — Telehealth: Payer: Self-pay | Admitting: Pharmacist

## 2023-08-27 NOTE — Telephone Encounter (Signed)
 Pharmacy contacted to discuss PA with Yupelri (revefensin) nebs.  Pharmacy shared that the claim was processed with both insurance cards and the cost remains $315  Patient contacted - left message requesting call back to  [phone]  Deductible remaining needs to be determined.  Cost may be less after meeting deductible. Consideration for use of revefensin vs ipratropium (multiple times per day).  Total time with patient call and documentation of interaction: 12 minutes.  F/U Phone call planned: None

## 2023-08-28 NOTE — Telephone Encounter (Signed)
 Patient contacted for follow-up of Yupelri (revefenesin) Cost.  Since last contact patient reports she has had a change of insurance.  She reports that she is doing well on the Nebulized therapies.  She also reports that the last price with the old insurance for this therapy was $21. Patient denies any significant medication related side effects.  Pharmacy states they ran claim through both insurance on file and determined that the cost was now $315 (possibly related to new deductible on new insurance)  I asked patient to determine remaining amount of deductible on her new insurance and then we could discuss options including a return to ipratropium.    I shared that I would be out of the office on 3/14 and we planned to discuss early next week.  She has adequate supply of medication.   Total time with patient call and documentation of interaction: 14 minutes.

## 2023-08-28 NOTE — Telephone Encounter (Signed)
 Reviewed and agree with Dr Macky Lower plan.

## 2023-09-11 ENCOUNTER — Other Ambulatory Visit: Payer: Self-pay

## 2023-09-11 ENCOUNTER — Other Ambulatory Visit: Payer: Self-pay | Admitting: Family Medicine

## 2023-09-11 ENCOUNTER — Telehealth: Payer: Self-pay | Admitting: Family Medicine

## 2023-09-11 DIAGNOSIS — J4540 Moderate persistent asthma, uncomplicated: Secondary | ICD-10-CM

## 2023-09-11 DIAGNOSIS — E109 Type 1 diabetes mellitus without complications: Secondary | ICD-10-CM

## 2023-09-11 DIAGNOSIS — E1159 Type 2 diabetes mellitus with other circulatory complications: Secondary | ICD-10-CM

## 2023-09-11 MED ORDER — IPRATROPIUM BROMIDE 0.02 % IN SOLN: 0.5000 mg | Freq: Four times a day (QID) | RESPIRATORY_TRACT | Status: DC

## 2023-09-11 NOTE — Telephone Encounter (Signed)
 Patient contacted for follow-up of cost for Yupelri (revefensin)     Since last contact patient reports she needs to switch to mail-order and found out the cost of Yupelri would be $2000.  She requested to change back to Atrovent (ipratropium) nebulizer treatments due to cost.   Additionally, she also requested that her next order for insulin would be sent as pens.  I verified doses of her insulin and noted that both currently show as pens (not vials).   She did not know which mail order to send the above.  She stated they would contact our office for refills/new prescriptions in the next 5-7 days.   At which time we can send the Atrovent nebs, and two insulins.   Updated but did not send ATROVENT as neb b/c patient reported having many days supply currently.   Total time with patient call and documentation of interaction: 17 minutes.

## 2023-09-11 NOTE — Telephone Encounter (Signed)
 Patient came in stating that her insurance wants her to pay $2,000 for the [facility]. States she wants to go back to ine of the 6 hour medications. If there are any questions please call her.

## 2023-09-14 MED ORDER — LOSARTAN POTASSIUM-HCTZ 100-12.5 MG PO TABS: 1.0000 | ORAL_TABLET | Freq: Every day | ORAL | 3 refills | Status: DC

## 2023-09-14 MED ORDER — ROSUVASTATIN CALCIUM 40 MG PO TABS: 40.0000 mg | ORAL_TABLET | Freq: Every day | ORAL | 3 refills | Status: DC

## 2023-09-14 MED ORDER — METFORMIN HCL ER 500 MG PO TB24: 500.0000 mg | ORAL_TABLET | Freq: Every day | ORAL | 0 refills | Status: DC

## 2023-09-14 NOTE — Telephone Encounter (Signed)
 Reviewed and agree with Dr Macky Lower plan.

## 2023-09-17 ENCOUNTER — Other Ambulatory Visit: Payer: Self-pay | Admitting: Family Medicine

## 2023-09-17 ENCOUNTER — Other Ambulatory Visit: Payer: Self-pay

## 2023-09-17 DIAGNOSIS — J4540 Moderate persistent asthma, uncomplicated: Secondary | ICD-10-CM

## 2023-09-17 DIAGNOSIS — J449 Chronic obstructive pulmonary disease, unspecified: Secondary | ICD-10-CM

## 2023-09-17 MED ORDER — FORMOTEROL FUMARATE 20 MCG/2ML IN NEBU: 20.0000 ug | INHALATION_SOLUTION | Freq: Two times a day (BID) | RESPIRATORY_TRACT | 6 refills | Status: DC

## 2023-09-17 MED ORDER — BUDESONIDE 0.5 MG/2ML IN SUSP: 0.5000 mg | Freq: Every day | RESPIRATORY_TRACT | 11 refills | Status: DC

## 2023-09-17 NOTE — Progress Notes (Signed)
 Received request from [facility] to change scripts for formoterol, budesonide and yupelri to their delivery service on behalf of the patient. Was able to change the first two of these by eScript, however I am unable to locate the prescription for yupelri in our system. I will inform the patient and have them reach out to their prescribing provider.

## 2023-09-18 ENCOUNTER — Other Ambulatory Visit: Payer: Self-pay

## 2023-09-18 DIAGNOSIS — R0982 Postnasal drip: Secondary | ICD-10-CM

## 2023-09-18 MED ORDER — FLUTICASONE PROPIONATE 50 MCG/ACT NA SUSP: 1.0000 | Freq: Two times a day (BID) | NASAL | 0 refills | Status: DC

## 2023-09-20 DIAGNOSIS — Z930 Tracheostomy status: Secondary | ICD-10-CM | POA: Diagnosis not present

## 2023-09-20 DIAGNOSIS — C329 Malignant neoplasm of larynx, unspecified: Secondary | ICD-10-CM | POA: Diagnosis not present

## 2023-09-20 DIAGNOSIS — Z96652 Presence of left artificial knee joint: Secondary | ICD-10-CM | POA: Diagnosis not present

## 2023-09-20 DIAGNOSIS — C321 Malignant neoplasm of supraglottis: Secondary | ICD-10-CM | POA: Diagnosis not present

## 2023-09-20 DIAGNOSIS — M1712 Unilateral primary osteoarthritis, left knee: Secondary | ICD-10-CM | POA: Diagnosis not present

## 2023-09-21 ENCOUNTER — Encounter: Payer: Self-pay | Admitting: Family Medicine

## 2023-09-24 ENCOUNTER — Ambulatory Visit: Payer: Self-pay | Admitting: Family Medicine

## 2023-09-24 VITALS — BP 132/80 | HR 78 | Ht 67.0 in | Wt 240.0 lb

## 2023-09-24 DIAGNOSIS — I5032 Chronic diastolic (congestive) heart failure: Secondary | ICD-10-CM | POA: Diagnosis not present

## 2023-09-24 DIAGNOSIS — E109 Type 1 diabetes mellitus without complications: Secondary | ICD-10-CM

## 2023-09-24 DIAGNOSIS — E039 Hypothyroidism, unspecified: Secondary | ICD-10-CM | POA: Diagnosis not present

## 2023-09-24 MED ORDER — INSULIN GLARGINE 100 UNIT/ML SOLOSTAR PEN: 30.0000 [IU] | PEN_INJECTOR | Freq: Every day | SUBCUTANEOUS | 12 refills | Status: DC

## 2023-09-24 MED ORDER — INSULIN LISPRO (1 UNIT DIAL) 100 UNIT/ML (KWIKPEN): 12.0000 [IU] | PEN_INJECTOR | Freq: Three times a day (TID) | SUBCUTANEOUS | 12 refills | Status: DC

## 2023-09-24 NOTE — Assessment & Plan Note (Signed)
-  Recheck TSH today with reflex T4 -will adjust dosage as necessary

## 2023-09-24 NOTE — Assessment & Plan Note (Signed)
-   Refills of humalog and lantus pens sent to [facility] - patient to follow up with Dr. Raymondo Band for insulin management - if unable to see Dr. Raymondo Band, she will schedule follow up with me in one month, at which time we will do her diabetic foot exam, and discuss ophthalmology referral.

## 2023-09-24 NOTE — Assessment & Plan Note (Signed)
>>  ASSESSMENT AND PLAN FOR KETOSIS-PRONE DIABETES MELLITUS (HCC) WRITTEN ON 09/24/2023  3:32 PM BY Iliana Hutt, DO  - Refills of humalog  and lantus  pens sent to [facility] - patient to follow up with Dr. Koval for insulin  management - if unable to see Dr. Koval, she will schedule follow up with me in one month, at which time we will do her diabetic foot exam, and discuss ophthalmology referral.

## 2023-09-24 NOTE — Progress Notes (Signed)
    SUBJECTIVE:   CHIEF COMPLAINT / HPI:   Thyroid follow up TSH was elevated at her last visit, we increased her does of synthroid to 175 mcg. Will need to repeat her TSH today.  Overall she is feeling well. 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 She also requests refills of her insulin pens, to be sent to her mail in pharmacy rather than [facility].  Denies palpitations, chest pain, shortness of breath  PERTINENT  PMH / PSH: Hypothyroid  OBJECTIVE:   BP 132/80   Pulse 78   Ht 5\' 7"  (1.702 m)   Wt 240 lb (108.9 kg)   SpO2 90%   BMI 37.59 kg/m   General: well appearing, no distress HENT: No facial edema, PERRLA EOMI Cardiac: RRR, no m/r/g Respiratory: CTAB, no increased WOB  ASSESSMENT/PLAN:   Assessment & Plan Hypothyroidism, unspecified type -Recheck TSH today with reflex T4 -will adjust dosage as necessary Ketosis-prone diabetes mellitus (HCC) - Refills of humalog and lantus pens sent to [facility] - patient to follow up with Dr. Raymondo Band for insulin management - if unable to see Dr. Raymondo Band, she will schedule follow up with me in one month, at which time we will do her diabetic foot exam, and discuss ophthalmology referral.   Gerrit Heck, DO [facility]

## 2023-09-24 NOTE — Patient Instructions (Addendum)
 It was wonderful to see you today!  Today we will recheck your thyroid after the adjustments we made to your synthroid at your last visit. If we need to make anymore adjustments, I will give you a call when I have your test results.   I also refilled several of your medications and sent them to your new pharmacy. Please let me know if you need any further medications.  I would like to see you back in two months to check your A1c, unless you are able to see Dr. Raymondo Band sooner for management of your insulin.   Please call [phone] with any questions about today's appointment.   If you need any additional refills, please call your pharmacy before calling the office.  Gerrit Heck, DO Family Medicine

## 2023-09-25 ENCOUNTER — Other Ambulatory Visit: Payer: Self-pay | Admitting: Family Medicine

## 2023-09-25 DIAGNOSIS — E109 Type 1 diabetes mellitus without complications: Secondary | ICD-10-CM

## 2023-09-25 LAB — TSH RFX ON ABNORMAL TO FREE T4: TSH: 52.9 u[IU]/mL — ABNORMAL HIGH (ref 0.450–4.500)

## 2023-09-25 LAB — T4F: T4,Free (Direct): 0.52 ng/dL — ABNORMAL LOW (ref 0.82–1.77)

## 2023-09-26 ENCOUNTER — Encounter: Payer: Self-pay | Admitting: Family Medicine

## 2023-09-26 ENCOUNTER — Telehealth: Payer: Self-pay | Admitting: Family Medicine

## 2023-09-26 NOTE — Telephone Encounter (Signed)
 Called patient regarding her recent thyroid tests. Patient did not answer, so left HIPAA compliant voicemail requesting a call back to the office. I will also send her a MyChart message.   Patient's TSH is 52.9 and free t4 is similarly low. Will confirm with patient that she is taking her synthroid as prescribed, and if she is, will plan to do more in depth work up for elevated TSH. Will likely need to increase her synthroid as well. Currently on 175 mcg.

## 2023-09-28 NOTE — Telephone Encounter (Signed)
 Patient returns call to nurse line. Advised of message per Dr. Annabell Key.   Patient has a follow up with Dr. Koval tomorrow. Offered to schedule with PCP and Dr. Koval on 4/24, however, patient states that she has another appointment on this date.   Scheduled for PCP's next available on 10/15/23.  Patient does report taking Synthroid as prescribed.   Elsie Halo, RN

## 2023-09-29 ENCOUNTER — Other Ambulatory Visit: Payer: Self-pay

## 2023-09-29 ENCOUNTER — Encounter (HOSPITAL_COMMUNITY): Payer: Self-pay

## 2023-09-29 ENCOUNTER — Emergency Department (HOSPITAL_COMMUNITY)
Admission: EM | Admit: 2023-09-29 | Discharge: 2023-09-29 | Disposition: A | Discharge status: Home / Self Care | Attending: Emergency Medicine | Admitting: Emergency Medicine

## 2023-09-29 ENCOUNTER — Ambulatory Visit: Admitting: Student

## 2023-09-29 ENCOUNTER — Encounter: Payer: Self-pay | Admitting: Pharmacist

## 2023-09-29 VITALS — BP 70/50 | HR 68 | Wt 237.6 lb

## 2023-09-29 DIAGNOSIS — Z794 Long term (current) use of insulin: Secondary | ICD-10-CM | POA: Diagnosis not present

## 2023-09-29 DIAGNOSIS — R1032 Left lower quadrant pain: Secondary | ICD-10-CM | POA: Diagnosis not present

## 2023-09-29 DIAGNOSIS — Z79899 Other long term (current) drug therapy: Secondary | ICD-10-CM | POA: Insufficient documentation

## 2023-09-29 DIAGNOSIS — Z7984 Long term (current) use of oral hypoglycemic drugs: Secondary | ICD-10-CM | POA: Diagnosis not present

## 2023-09-29 DIAGNOSIS — E1159 Type 2 diabetes mellitus with other circulatory complications: Secondary | ICD-10-CM

## 2023-09-29 DIAGNOSIS — E1165 Type 2 diabetes mellitus with hyperglycemia: Secondary | ICD-10-CM | POA: Diagnosis not present

## 2023-09-29 DIAGNOSIS — Z853 Personal history of malignant neoplasm of breast: Secondary | ICD-10-CM | POA: Insufficient documentation

## 2023-09-29 DIAGNOSIS — E861 Hypovolemia: Secondary | ICD-10-CM | POA: Diagnosis not present

## 2023-09-29 DIAGNOSIS — I959 Hypotension, unspecified: Secondary | ICD-10-CM

## 2023-09-29 DIAGNOSIS — I152 Hypertension secondary to endocrine disorders: Secondary | ICD-10-CM

## 2023-09-29 DIAGNOSIS — E039 Hypothyroidism, unspecified: Secondary | ICD-10-CM | POA: Insufficient documentation

## 2023-09-29 DIAGNOSIS — E109 Type 1 diabetes mellitus without complications: Secondary | ICD-10-CM

## 2023-09-29 DIAGNOSIS — I10 Essential (primary) hypertension: Secondary | ICD-10-CM | POA: Insufficient documentation

## 2023-09-29 DIAGNOSIS — R6889 Other general symptoms and signs: Secondary | ICD-10-CM | POA: Diagnosis not present

## 2023-09-29 DIAGNOSIS — N179 Acute kidney failure, unspecified: Secondary | ICD-10-CM | POA: Diagnosis not present

## 2023-09-29 DIAGNOSIS — Z8521 Personal history of malignant neoplasm of larynx: Secondary | ICD-10-CM | POA: Insufficient documentation

## 2023-09-29 DIAGNOSIS — R404 Transient alteration of awareness: Secondary | ICD-10-CM | POA: Diagnosis not present

## 2023-09-29 DIAGNOSIS — Z9221 Personal history of antineoplastic chemotherapy: Secondary | ICD-10-CM | POA: Insufficient documentation

## 2023-09-29 DIAGNOSIS — Z923 Personal history of irradiation: Secondary | ICD-10-CM | POA: Insufficient documentation

## 2023-09-29 DIAGNOSIS — R9431 Abnormal electrocardiogram [ECG] [EKG]: Secondary | ICD-10-CM | POA: Diagnosis not present

## 2023-09-29 LAB — CBC WITH DIFFERENTIAL/PLATELET
Abs Immature Granulocytes: 0.02 10*3/uL (ref 0.00–0.07)
Basophils Absolute: 0.1 10*3/uL (ref 0.0–0.1)
Basophils Relative: 1 %
Eosinophils Absolute: 0.4 10*3/uL (ref 0.0–0.5)
Eosinophils Relative: 7 %
HCT: 38.8 % (ref 36.0–46.0)
Hemoglobin: 12.7 g/dL (ref 12.0–15.0)
Immature Granulocytes: 0 %
Lymphocytes Relative: 30 %
Lymphs Abs: 1.9 10*3/uL (ref 0.7–4.0)
MCH: 29.5 pg (ref 26.0–34.0)
MCHC: 32.7 g/dL (ref 30.0–36.0)
MCV: 90.2 fL (ref 80.0–100.0)
Monocytes Absolute: 0.5 10*3/uL (ref 0.1–1.0)
Monocytes Relative: 7 %
Neutro Abs: 3.5 10*3/uL (ref 1.7–7.7)
Neutrophils Relative %: 55 %
Platelets: 335 10*3/uL (ref 150–400)
RBC: 4.3 MIL/uL (ref 3.87–5.11)
RDW: 12.4 % (ref 11.5–15.5)
WBC: 6.4 10*3/uL (ref 4.0–10.5)
nRBC: 0 % (ref 0.0–0.2)

## 2023-09-29 LAB — TROPONIN I (HIGH SENSITIVITY)
Troponin I (High Sensitivity): 10 ng/L (ref <18)
Troponin I (High Sensitivity): 10 ng/L (ref <18)

## 2023-09-29 LAB — CBG MONITORING, ED: Glucose-Capillary: 138 mg/dL — ABNORMAL HIGH (ref 70–99)

## 2023-09-29 LAB — BASIC METABOLIC PANEL WITH GFR
Anion gap: 13 (ref 5–15)
BUN: 17 mg/dL (ref 8–23)
CO2: 22 mmol/L (ref 22–32)
Calcium: 10 mg/dL (ref 8.9–10.3)
Chloride: 103 mmol/L (ref 98–111)
Creatinine, Ser: 1.62 mg/dL — ABNORMAL HIGH (ref 0.44–1.00)
GFR, Estimated: 35 mL/min — ABNORMAL LOW (ref >60)
Glucose, Bld: 133 mg/dL — ABNORMAL HIGH (ref 70–99)
Potassium: 4.1 mmol/L (ref 3.5–5.1)
Sodium: 138 mmol/L (ref 135–145)

## 2023-09-29 LAB — GLUCOSE, POCT (MANUAL RESULT ENTRY): POC Glucose: 162 mg/dL — AB (ref 70–99)

## 2023-09-29 LAB — I-STAT CG4 LACTIC ACID, ED: Lactic Acid, Venous: 1.3 mmol/L (ref 0.5–1.9)

## 2023-09-29 LAB — BRAIN NATRIURETIC PEPTIDE: B Natriuretic Peptide: 13.9 pg/mL (ref 0.0–100.0)

## 2023-09-29 LAB — TSH: TSH: 24.845 u[IU]/mL — ABNORMAL HIGH (ref 0.350–4.500)

## 2023-09-29 LAB — POCT HEMOGLOBIN: Hemoglobin: 12.7 g/dL (ref 11–14.6)

## 2023-09-29 MED ORDER — LACTATED RINGERS IV BOLUS
500.0000 mL | Freq: Once | INTRAVENOUS | Status: AC
  Administered 2023-09-29: 500 mL via INTRAVENOUS

## 2023-09-29 NOTE — Progress Notes (Signed)
 S:     Chief Complaint  Patient presents with   Medication Management    Hypotension   68 y.o. female who presents for diabetes evaluation, education, and management. Patient arrives in good spirits and presents without any assistance.  Patient was referred and last seen by Primary Care Provider, Dr. Hyacinth Meeker, on 09/24/23.   PMH is significant for history of tracheostomy, HTN, T2DM, COPD .   Current diabetes medications include: Lantus (insulin glargine) 30 units daily, Humalog (insulin lispro) 10 units TID with meals, metformin 500 mg daily Current hypertension medications include: carvedilol 12.5 mg BID, losartan-hydrochlorothiazide 100-25 mg daily Current hyperlipidemia medications include: rosuvastatin 40 mg daily Current COPD medications include: Proventil (albuterol)  2.5 mg PRN, Pulmnicort (budesonide) 0.5 mg BID, Atrovent (ipratropium)2 sprays each nostril BID, formoterol (Perforomist) 20 mcg/54mL, Singulair (montelukast) 10 mg daily  Patient reports adherence to taking all medications as prescribed. Took BP medications this morning.  Patient reports hypoglycemic events.  Patient reported dietary habits: due to nausea for the last week, has not been eating as often following decreased appetite Drinks: water, juice  O:   Review of Systems  Constitutional:  Positive for malaise/fatigue.  Respiratory:  Negative for shortness of breath.   Gastrointestinal:  Positive for nausea.  Neurological:  Positive for dizziness and headaches.  All other systems reviewed and are negative.   Physical Exam Cardiovascular:     Rate and Rhythm: Bradycardia present.  Pulmonary:     Effort: Pulmonary effort is normal.  Musculoskeletal:     Right lower leg: No edema.     Left lower leg: No edema.  Neurological:     Mental Status: She is alert.  Psychiatric:        Mood and Affect: Mood normal.     Dexcom G7 CGM Download today 09/29/23 % Time CGM is active: 92.3% Average Glucose:  234 mg/dL Glucose Management Indicator: 8.9  Glucose Variability: 48.2% (goal <36%) Time in Goal:  - Time in range 70-180: 26% - Time above range: 67% - Time below range: 7% Observed patterns: High variability, multiple hypoglycemic events until pen change (4/9), then multiple hyperglycemic events over the last 6 days   Lab Results  Component Value Date   HGBA1C 7.1 (A) 07/16/2023   Vitals:   09/29/23 0850 09/29/23 0851  BP: (!) 62/38 (!) 74/50  Pulse:    SpO2:      Lipid Panel     Component Value Date/Time   CHOL 225 (H) 03/16/2023 1621   TRIG 267 (H) 03/16/2023 1621   HDL 57 03/16/2023 1621   CHOLHDL 3.9 03/16/2023 1621   CHOLHDL 2.9 03/20/2011 0840   VLDL 15 03/20/2011 0840   LDLCALC 121 (H) 03/16/2023 1621    Clinical Atherosclerotic Cardiovascular Disease (ASCVD): Yes  The ASCVD Risk score (Arnett DK, et al., 2019) failed to calculate for the following reasons:   The valid systolic blood pressure range is 90 to 200 mmHg   Patient is participating in a Managed Medicaid Plan:  Yes   A/P: Diabetes longstanding currently worsened over the last 6 days. Patient is able to verbalize appropriate hypoglycemia management plan. Medication adherence appears good. Patient reports nausea for the last 6 days (starting 4/9), resulting in decreased appetite. Reports worsened headache this morning, took ondansetron (Zofran) with no relief. Reports not eating this morning due to nausea. Patient reports starting new insulin pen on 4/9. -Continued basal insulin Lantus (insulin glargine) 30 units daily in the morning.  -Continued  rapid insulin Humalog (insulin lispro) 10 units TID with meals.  -Continued metformin 500 mg daily.  -Counseled patient on replacing current insulin pen with new insulin pen due to reported nausea.  History of hypertension Currently hypotensive on carvedilol and losartan-hydrochlorothiazide. Hypotensive in office (62-74/38-50 mmHg).  Discussed with attending  Dr. McDiarmid and Dr. Eveleen Hinds.  Written patient instructions provided. Patient verbalized understanding of treatment plan.  Total time in face to face counseling 39 minutes.    Follow-up:  Pharmacist PRN PCP clinic visit in PRN Patient seen with Teofilo Fellers, PharmD Candidate.

## 2023-09-29 NOTE — Progress Notes (Cosign Needed Addendum)
    SUBJECTIVE:   CHIEF COMPLAINT / HPI:   Blood Pressure Concerns:  - Initially presented for pharmacy visit for chronic concerns - Found to have BP of 70/palp - the patient has been feeling unwell for the past few days. She reports feeling nauseated and has been staying in bed. During the visit, her blood pressure is found to be low, despite being on antihypertensive medication for HTN usually. - DID take her antiHTN meds today   PERTINENT  PMH / PSH: Reviewed  OBJECTIVE:   BP (!) 74/50 (BP Location: Right Arm, Patient Position: Sitting, Cuff Size: Large)   Pulse 68   Wt 237 lb 9.6 oz (107.8 kg)   SpO2 95%   BMI 37.21 kg/m   General: Alert and oriented in no apparent distress Heart: Regular rate and rhythm with no murmurs appreciated Lungs: Normal WOB, h/o tracheostomy no trach tube in place, clean and dry area  Abdomen: no abdominal pain Skin: Warm and dry   ASSESSMENT/PLAN:   Assessment & Plan Hypotension, unspecified hypotension type Hypotension noted at 70/50, on antihypertensive medication at home and did take today. Unknown etiology, worry about infectious causes. Advised against driving. EMS transport to ED arranged for further evaluation, we will arrange. Will update ED Charge and FMTS. CBG POC to be obtained prior to transport. Patient awake, alert, oriented and otherwise stable. Called to reach charge with ED x3 but unable to reach, left message and updated FMTS team. CBG 162. Stable for transport  - Arrange EMS transport to the emergency department for further evaluation of hypotension. - POC CBG      Ernestina Headland, MD [facility]

## 2023-09-29 NOTE — Assessment & Plan Note (Signed)
>>  ASSESSMENT AND PLAN FOR KETOSIS-PRONE DIABETES MELLITUS (HCC) WRITTEN ON 09/29/2023  9:32 AM BY Brenae Lasecki G, RPH-CPP  Diabetes longstanding currently worsened over the last 6 days. Patient is able to verbalize appropriate hypoglycemia management plan. Medication adherence appears good. Patient reports nausea for the last 6 days (starting 4/9), resulting in decreased appetite. Reports worsened headache this morning, took ondansetron  (Zofran ) with no relief. Reports not eating this morning due to nausea. Patient reports starting new insulin  pen on 4/9. -Continued basal insulin  Lantus  (insulin  glargine) 30 units daily in the morning.  -Continued rapid insulin  Humalog  (insulin  lispro) 10 units TID with meals.  -Continued metformin  500 mg daily.  -Counseled patient on replacing current insulin  pen with new insulin  pen due to reported nausea.

## 2023-09-29 NOTE — ED Notes (Signed)
 While in triage became hypotensive to the 80 SBP

## 2023-09-29 NOTE — Assessment & Plan Note (Signed)
 History of hypertension Currently hypotensive on carvedilol and losartan-hydrochlorothiazide. Hypotensive in office (62-74/38-50 mmHg).

## 2023-09-29 NOTE — ED Provider Notes (Signed)
 [place] EMERGENCY DEPARTMENT AT [facility] Provider Note   CSN: [id number] Arrival date & time: 09/29/23  0865     History  Chief Complaint  Patient presents with   Hypotension    Deborah [name] is a 68 y.o. female.  HPI  68 year old female with history of hypothyroidism, laryngeal cancer s/p laryngectomy, breast cancer s/p radiation/chemotherapy, hypertension, type 2 diabetes presenting from PCP office for hypotension.  BP on arrival was 73/50.  Patient reports that she is taking all of her medications as prescribed.  She has not missed any medications this morning.  She had a TSH done about a month ago, which was elevated-PCP increased Synthroid.  For the past few days patient endorses dizziness, orthopnea, fatigue, nausea without vomiting, low back pain and left lower quadrant abdominal pain. Denies fevers, cough, congestion, vomiting, diarrhea, edema, chest pain.     Home Medications Prior to Admission medications   Medication Sig Start Date End Date Taking? Authorizing Provider  budesonide (PULMICORT) 0.5 MG/2ML nebulizer solution Take 2 mLs (0.5 mg total) by nebulization daily. 09/17/23   Gerrit Heck, DO  carvedilol (COREG) 12.5 MG tablet Take 1 tablet (12.5 mg total) by mouth 2 (two) times daily with a meal. 08/13/23   Gerrit Heck, DO  Continuous Glucose Receiver (DEXCOM G7 RECEIVER) DEVI USE AS DIRECTED 03/21/23   Dameron, Nolberto Hanlon, DO  Continuous Glucose Sensor (DEXCOM G7 SENSOR) MISC 1 Device by Does not apply route as directed. Apply new sensor every 10 days 02/09/23   McDiarmid, Leighton Roach, MD  EQ ALLERGY RELIEF, CETIRIZINE, 10 MG tablet Take 1 tablet by mouth once daily 09/11/23   Gerrit Heck, DO  escitalopram (LEXAPRO) 20 MG tablet Take 1 tablet (20 mg total) by mouth daily. 03/05/23   McDiarmid, Leighton Roach, MD  fluticasone (FLONASE) 50 MCG/ACT nasal spray Place 1 spray into both nostrils 2 (two) times daily. 09/18/23   Gerrit Heck, DO   formoterol (PERFOROMIST) 20 MCG/2ML nebulizer solution Take 2 mLs (20 mcg total) by nebulization 2 (two) times daily. 09/17/23   Gerrit Heck, DO  Humidifiers MISC 1 application by Tracheal Tube route as needed. 02/05/21   Littie Deeds, MD  insulin glargine (LANTUS) 100 UNIT/ML Solostar Pen Inject 30 Units into the skin daily. 09/24/23   Gerrit Heck, DO  insulin lispro (HUMALOG KWIKPEN) 100 UNIT/ML KwikPen Inject 12 Units into the skin 3 (three) times daily. 09/24/23   Gerrit Heck, DO  Insulin Pen Needle (BD PEN NEEDLE NANO 2ND GEN) 32G X 4 MM MISC USE AS DIRECTED FOUR TIMES DAILY WITH INSULIN PEN. 11/17/22   Littie Deeds, MD  ipratropium (ATROVENT) 0.02 % nebulizer solution Take 2.5 mLs (0.5 mg total) by nebulization 4 (four) times daily. 09/11/23   McDiarmid, Leighton Roach, MD  levothyroxine (SYNTHROID) 175 MCG tablet Take 1 tablet (175 mcg total) by mouth daily before breakfast. 08/14/23   Gerrit Heck, DO  losartan-hydrochlorothiazide (HYZAAR) 100-12.5 MG tablet Take 1 tablet by mouth daily. 09/14/23   Gerrit Heck, DO  metFORMIN (GLUCOPHAGE-XR) 500 MG 24 hr tablet TAKE 1 TABLET BY MOUTH DAILY  WITH BREAKFAST 09/28/23   Gerrit Heck, DO  montelukast (SINGULAIR) 10 MG tablet TAKE 1 TABLET BY MOUTH AT BEDTIME 09/11/23   Gerrit Heck, DO  nystatin cream (MYCOSTATIN) Apply 1 Application topically daily as needed (rash under breast stomach).    [provider]  ondansetron (ZOFRAN) 4 MG tablet Take 1 tablet (4 mg total) by mouth every 8 (eight) hours as needed  for nausea or vomiting. 07/16/23   Rayma Calandra, DO  rosuvastatin (CRESTOR) 40 MG tablet Take 1 tablet (40 mg total) by mouth daily. 09/14/23   Rayma Calandra, DO  sodium chloride HYPERTONIC 3 % nebulizer solution Take by nebulization 3 (three) times daily. 05/22/23   Wilfredo Hanly, MD      Allergies    Lisinopril    Review of Systems   Review of Systems  Physical Exam Updated Vital Signs BP (!) 165/78    Pulse 72   Temp 98.2 F (36.8 C)   Resp 15   SpO2 90%  Physical Exam Constitutional:      General: She is not in acute distress.    Appearance: Normal appearance. She is obese.  HENT:     Head: Normocephalic and atraumatic.  Eyes:     Extraocular Movements: Extraocular movements intact.     Pupils: Pupils are equal, round, and reactive to light.  Cardiovascular:     Rate and Rhythm: Normal rate and regular rhythm.     Pulses: Normal pulses.     Heart sounds: Normal heart sounds.  Pulmonary:     Effort: Pulmonary effort is normal.     Breath sounds: Normal breath sounds.  Abdominal:     General: Abdomen is flat. Bowel sounds are normal.     Palpations: Abdomen is soft.     Tenderness: There is abdominal tenderness (LLQ). There is no guarding or rebound.  Musculoskeletal:     Cervical back: Normal range of motion.     Right lower leg: No edema.     Left lower leg: No edema.  Skin:    General: Skin is warm and dry.     Capillary Refill: Capillary refill takes less than 2 seconds.  Neurological:     General: No focal deficit present.     Mental Status: She is alert and oriented to person, place, and time.     ED Results / Procedures / Treatments   Labs (all labs ordered are listed, but only abnormal results are displayed) Labs Reviewed  BASIC METABOLIC PANEL WITH GFR - Abnormal; Notable for the following components:      Result Value   Glucose, Bld 133 (*)    Creatinine, Ser 1.62 (*)    GFR, Estimated 35 (*)    All other components within normal limits  TSH - Abnormal; Notable for the following components:   TSH 24.845 (*)    All other components within normal limits  CBG MONITORING, ED - Abnormal; Notable for the following components:   Glucose-Capillary 138 (*)    All other components within normal limits  CBC WITH DIFFERENTIAL/PLATELET  BRAIN NATRIURETIC PEPTIDE  URINALYSIS, ROUTINE W REFLEX MICROSCOPIC  I-STAT CG4 LACTIC ACID, ED  TROPONIN I (HIGH  SENSITIVITY)  TROPONIN I (HIGH SENSITIVITY)    EKG EKG Interpretation Date/Time:  Tuesday September 29 2023 09:53:29 EDT Ventricular Rate:  62 PR Interval:  176 QRS Duration:  88 QT Interval:  454 QTC Calculation: 460 R Axis:   28  Text Interpretation: Normal sinus rhythm Abnormal QRS-T angle, consider primary T wave abnormality Abnormal ECG When compared with ECG of 05-Aug-2023 08:48, PREVIOUS ECG IS PRESENT Confirmed by Clay Cummins [phone]) on 09/29/2023 11:02:32 AM  Radiology No results found.  Procedures Ultrasound ED Echo  Date/Time: 09/29/2023 12:16 PM  Performed by: Lavada Porteous, DO Authorized by: Clay Cummins, MD   Procedure details:    Indications: hypotension     Views: parasternal  long axis view, parasternal short axis view and apical 4 chamber view     Images: archived     Limitations:  Body habitus Findings:    Pericardium: small pericardial effusion     LV Function: normal (>50% EF)     LV Function comment:  Left ventricular hypertrophy present.   RV Diameter: normal     IVC comment:  Unable to visualize. Impression:    Impression: pericardial effusion present     Impression comment:  LVH.  Adequate LV function.     Medications Ordered in ED Medications  lactated ringers bolus 500 mL (0 mLs Intravenous Stopped 09/29/23 1330)    ED Course/ Medical Decision Making/ A&P                                 Medical Decision Making 68year-old female presenting with hypotension.  She did take her carvedilol and losartan today.  Differential is broad and includes: ACS, heart failure, renal disease, dehydration, hypothyroidism, medication side effect.  Reassuringly, patient did not meet SIRS and I have low suspicion for hemorrhage (normal hemoglobin).  Will obtain labs and point-of-care ultrasound.  Will give 500 mL LR bolus.  POCUS echo performed, see procedure for details.  Noted small pericardial effusion, this was present 1 year ago on her last  echocardiogram and unlikely the cause of her hypertension.  Adequate LVEF, but did note LVH (last echocardiogram did show severe LVH).  BNP is unremarkable, and I have low suspicion heart failure is the cause of her hypotension. Independently reviewed labs: Troponins trended flat, EKG normal sinus without ischemia-low suspicion for ACS at this time.  CBC unremarkable.  Patient does have AKI, creatinine of 1.6, baseline approximately 1-higher suspicion for dehydration.  Euvolemic on exam with improving blood pressure, I do not believe patient is in renal failure.  Patient responded well to 500 mL LR bolus, went from normotensive back to hypertensive.  She is hemodynamically stable and well-appearing on reexamination.  She is not fluid overloaded.  TSH 24, downtrending since starting 175 mcg Synthroid.  At this time hypotension has resolved, and we believe we have adequately ruled out emergent/emergent causes that would warrant further investigation.  We suspect dehydration is the primary cause of her hypotension given findings above.  Additionally, patient was notified about AKI and recommended to drink at least 2L of fluids per day.  Given hypotension + current AKI we also instructed patient to hold losartan-HCTZ until she is able to follow-up with her PCP.  Recommend repeat renal function at PCP office.  She is medically stable to discharge with close PCP follow-up.  Patient expressed understanding and agreement with plan.   Amount and/or Complexity of Data Reviewed External Data Reviewed: labs and notes. Labs: ordered.          Final Clinical Impression(s) / ED Diagnoses Final diagnoses:  None    Rx / DC Orders ED Discharge Orders     None         Lavada Porteous, DO 09/29/23 1453    Clay Cummins, MD 09/29/23 [phone]

## 2023-09-29 NOTE — ED Triage Notes (Signed)
 Came in via [facility], came from Dr office and sent for hypotension, was feeling dizzy Stating 10/10 back pain which is new  BP hypertensive in triage

## 2023-09-29 NOTE — Patient Instructions (Signed)
 It was great to see you today! Thank you for choosing [facility] for your primary care.  Today we addressed: Sending to ED via ems   If you haven't already, sign up for My Chart to have easy access to your labs results, and communication with your primary care physician.   Please arrive 15 minutes before your appointment to ensure smooth check in process.  We appreciate your efforts in making this happen.  Thank you for allowing me to participate in your care, Ernestina Headland, MD 09/29/2023, 9:21 AM PGY-3, [facility]

## 2023-09-29 NOTE — Assessment & Plan Note (Signed)
 Diabetes longstanding currently worsened over the last 6 days. Patient is able to verbalize appropriate hypoglycemia management plan. Medication adherence appears good. Patient reports nausea for the last 6 days (starting 4/9), resulting in decreased appetite. Reports worsened headache this morning, took ondansetron (Zofran) with no relief. Reports not eating this morning due to nausea. Patient reports starting new insulin pen on 4/9. -Continued basal insulin Lantus (insulin glargine) 30 units daily in the morning.  -Continued rapid insulin Humalog (insulin lispro) 10 units TID with meals.  -Continued metformin 500 mg daily.  -Counseled patient on replacing current insulin pen with new insulin pen due to reported nausea.

## 2023-09-29 NOTE — Discharge Instructions (Addendum)
 Please call and schedule appointment for ED follow-up at your earliest convenience Please continue to hold your losartan-hydrochlorothiazide until you see your PCP. I recommend your PCP recheck your kidney function. Continue to drink at least 2 mL of fluid per day

## 2023-10-08 ENCOUNTER — Encounter: Payer: Self-pay | Admitting: Family Medicine

## 2023-10-08 ENCOUNTER — Ambulatory Visit: Admitting: Family Medicine

## 2023-10-08 VITALS — BP 140/74 | HR 82 | Ht 67.0 in | Wt 239.4 lb

## 2023-10-08 DIAGNOSIS — F339 Major depressive disorder, recurrent, unspecified: Secondary | ICD-10-CM

## 2023-10-08 DIAGNOSIS — I152 Hypertension secondary to endocrine disorders: Secondary | ICD-10-CM | POA: Diagnosis not present

## 2023-10-08 DIAGNOSIS — E1159 Type 2 diabetes mellitus with other circulatory complications: Secondary | ICD-10-CM | POA: Diagnosis not present

## 2023-10-08 DIAGNOSIS — Z794 Long term (current) use of insulin: Secondary | ICD-10-CM | POA: Diagnosis not present

## 2023-10-08 DIAGNOSIS — E109 Type 1 diabetes mellitus without complications: Secondary | ICD-10-CM

## 2023-10-08 DIAGNOSIS — Z888 Allergy status to other drugs, medicaments and biological substances status: Secondary | ICD-10-CM | POA: Diagnosis not present

## 2023-10-08 DIAGNOSIS — I10 Essential (primary) hypertension: Secondary | ICD-10-CM | POA: Diagnosis not present

## 2023-10-08 DIAGNOSIS — Z08 Encounter for follow-up examination after completed treatment for malignant neoplasm: Secondary | ICD-10-CM | POA: Diagnosis not present

## 2023-10-08 DIAGNOSIS — Z79899 Other long term (current) drug therapy: Secondary | ICD-10-CM | POA: Diagnosis not present

## 2023-10-08 DIAGNOSIS — Z8521 Personal history of malignant neoplasm of larynx: Secondary | ICD-10-CM | POA: Diagnosis not present

## 2023-10-08 LAB — POCT GLYCOSYLATED HEMOGLOBIN (HGB A1C): HbA1c, POC (controlled diabetic range): 10.5 % — AB (ref 0.0–7.0)

## 2023-10-08 MED ORDER — BUPROPION HCL ER (XL) 150 MG PO TB24: 150.0000 mg | ORAL_TABLET | Freq: Every day | ORAL | 0 refills | Status: DC

## 2023-10-08 MED ORDER — LOSARTAN POTASSIUM-HCTZ 50-12.5 MG PO TABS: 1.0000 | ORAL_TABLET | Freq: Every day | ORAL | 0 refills | Status: DC

## 2023-10-08 MED ORDER — INSULIN GLARGINE 100 UNIT/ML SOLOSTAR PEN: 35.0000 [IU] | PEN_INJECTOR | Freq: Every day | SUBCUTANEOUS | 3 refills | Status: DC

## 2023-10-08 NOTE — Patient Instructions (Signed)
 It was wonderful to see you today!  Today you were seen for several things.  1.  Blood pressure follow-up: Because you had a low blood pressure a week ago in the emergency department, we have decreased your Hyzaar from 100-12.5 to 50-12.5.  I would like you to follow-up in 2 weeks for a blood pressure check, and as long as your blood pressure remains stable we will continue with your current dose of medicine.  2.  Diabetes-blood sugar has been trending high, so we increased your insulin  from 30 units of long-acting to 35 units of long-acting daily, and we discussed taking your short acting as 12 units 3 times a day.  Please continue to check your blood sugars using your CGM daily and follow-up with Dr. Koval as soon as you can.  3.  We also discussed your medication for your depression.  I have added a medicine called Wellbutrin  which works well with the 1 you are currently taking to manage your symptoms.  Please start taking this medicine once a day we will discuss how you are feeling at your next appointment.  Please call [phone] with any questions about today's appointment.   If you need any additional refills, please call your pharmacy before calling the office.  Rayma Calandra, DO Family Medicine

## 2023-10-08 NOTE — Assessment & Plan Note (Addendum)
 Decreased Hyzaar from 100-12.5 down to 50-12.5 initially, but after repeat bp, will continue to hold medications until next appointment. Would consider adding back per component if BP elevated above goal at subsequent visit. Called patient with update, she agreed with the revised plan. -Follow-up in 2 weeks, patient will take blood pressure at home and this time

## 2023-10-08 NOTE — Progress Notes (Cosign Needed Addendum)
    SUBJECTIVE:   CHIEF COMPLAINT / HPI:   Recently went to the ED for HTN, got fluids, likely dehydrated.  TSH had improved.   Patient now feeling well, has been taking her BPs at home since her trip to the hospital and not had an elevated blood pressure reading until today.  Given her recent concerning low, discussed changes which will be listed below.  A1c checked today as well, significant increase from previous value 3 months ago, discussed increases insulin  and provided counseling on mechanism for diabetes at this stage in her life.  Patient also reports that her depressive symptoms have been getting worse, she has been avoiding interacting with family and friends and has been more and more irritated with them.  She is currently on the maximum dose of Lexapro  at 20 mg.  PERTINENT  PMH / PSH: MDD, ketosis-prone diabetes mellitus, hypertension  OBJECTIVE:   BP (!) 140/74   Pulse 82   Ht 5\' 7"  (1.702 m)   Wt 239 lb 6.4 oz (108.6 kg)   SpO2 99%   BMI 37.50 kg/m   Neuro: Alert and oriented, no distress Cardiac: RRR, no M/R/G Respiratory: CTAB, no increased work of breathing Diabetic foot exam failed 9 out of 9 points bilaterally, pulses intact bilaterally.  No evidence of cut scrapes or calluses bilaterally.  ASSESSMENT/PLAN:   Assessment & Plan Ketosis-prone diabetes mellitus (HCC) -A1c in office today is 10.5, CGM shows sugars consistently above 350 -Increased insulin  Lantus  to 35 units daily -Increased Humalog  to 12 units 3 times daily, patient had previously been taking 10 units 3 times daily -Patient will follow-up for hypertension in 2 weeks, will check CGM at that time Hypertension associated with diabetes (HCC) Decreased Hyzaar from 100-12.5 down to 50-12.5 initially, but after repeat bp, will continue to hold medications until next appointment. Would consider adding back per component if BP elevated above goal at subsequent visit. Called patient with update, she  agreed with the revised plan. -Follow-up in 2 weeks, patient will take blood pressure at home and this time Recurrent major depressive disorder, remission status unspecified (HCC) - Added Wellbutrin  150 mg daily to augment Lexapro    Rayma Calandra, DO [facility]

## 2023-10-08 NOTE — Assessment & Plan Note (Signed)
-   Added Wellbutrin  150 mg daily to augment Lexapro

## 2023-10-08 NOTE — Assessment & Plan Note (Addendum)
-  A1c in office today is 10.5, CGM shows sugars consistently above 350 -Increased insulin  Lantus  to 35 units daily -Increased Humalog  to 12 units 3 times daily, patient had previously been taking 10 units 3 times daily -Patient will follow-up for hypertension in 2 weeks, will check CGM at that time

## 2023-10-08 NOTE — Addendum Note (Signed)
 Addended by: Ferrah Panagopoulos on: 10/08/2023 05:05 PM   Modules accepted: Orders

## 2023-10-08 NOTE — Assessment & Plan Note (Signed)
>>  ASSESSMENT AND PLAN FOR KETOSIS-PRONE DIABETES MELLITUS (HCC) WRITTEN ON 10/08/2023  4:58 PM BY Nylan Nakatani, DO  -A1c in office today is 10.5, CGM shows sugars consistently above 350 -Increased insulin  Lantus  to 35 units daily -Increased Humalog  to 12 units 3 times daily, patient had previously been taking 10 units 3 times daily -Patient will follow-up for hypertension in 2 weeks, will check CGM at that time

## 2023-10-15 ENCOUNTER — Ambulatory Visit: Admitting: Family Medicine

## 2023-10-20 DIAGNOSIS — Z96652 Presence of left artificial knee joint: Secondary | ICD-10-CM | POA: Diagnosis not present

## 2023-10-20 DIAGNOSIS — Z930 Tracheostomy status: Secondary | ICD-10-CM | POA: Diagnosis not present

## 2023-10-20 DIAGNOSIS — M1712 Unilateral primary osteoarthritis, left knee: Secondary | ICD-10-CM | POA: Diagnosis not present

## 2023-10-20 DIAGNOSIS — C329 Malignant neoplasm of larynx, unspecified: Secondary | ICD-10-CM | POA: Diagnosis not present

## 2023-10-20 DIAGNOSIS — C321 Malignant neoplasm of supraglottis: Secondary | ICD-10-CM | POA: Diagnosis not present

## 2023-10-22 ENCOUNTER — Encounter: Payer: Self-pay | Admitting: Family Medicine

## 2023-10-22 ENCOUNTER — Ambulatory Visit (INDEPENDENT_AMBULATORY_CARE_PROVIDER_SITE_OTHER): Admitting: Family Medicine

## 2023-10-22 VITALS — BP 167/86 | HR 81 | Ht 67.0 in | Wt 242.8 lb

## 2023-10-22 DIAGNOSIS — J449 Chronic obstructive pulmonary disease, unspecified: Secondary | ICD-10-CM

## 2023-10-22 DIAGNOSIS — E1159 Type 2 diabetes mellitus with other circulatory complications: Secondary | ICD-10-CM

## 2023-10-22 DIAGNOSIS — E109 Type 1 diabetes mellitus without complications: Secondary | ICD-10-CM | POA: Diagnosis not present

## 2023-10-22 DIAGNOSIS — I152 Hypertension secondary to endocrine disorders: Secondary | ICD-10-CM | POA: Diagnosis not present

## 2023-10-22 DIAGNOSIS — Z0000 Encounter for general adult medical examination without abnormal findings: Secondary | ICD-10-CM

## 2023-10-22 DIAGNOSIS — E039 Hypothyroidism, unspecified: Secondary | ICD-10-CM

## 2023-10-22 DIAGNOSIS — I5032 Chronic diastolic (congestive) heart failure: Secondary | ICD-10-CM

## 2023-10-22 MED ORDER — SEMAGLUTIDE(0.25 OR 0.5MG/DOS) 2 MG/1.5ML ~~LOC~~ SOPN: 0.2500 mg | PEN_INJECTOR | SUBCUTANEOUS | 3 refills | Status: DC

## 2023-10-22 NOTE — Patient Instructions (Signed)
 It was wonderful to see you today!  Your blood sugars are in much better range after the adjustments we made to your insulin . To help with the fluctuations you have been having, and to help you with weight loss, I have started you on ozempic. This medicine is a once a week injection that will help lower your blood sugar and help you lose weight. For the first 4 weeks, you will take the 0.25 mg dose. If you don't have any side effects at that dose, you can increase to 0.5 mg.   I would like to see you again in 3 months, unless your thyroid  tests come back with abnormal results.   Please call [phone] with any questions about today's appointment.   If you need any additional refills, please call your pharmacy before calling the office.  Rayma Calandra, DO Family Medicine

## 2023-10-22 NOTE — Assessment & Plan Note (Signed)
>>  ASSESSMENT AND PLAN FOR KETOSIS-PRONE DIABETES MELLITUS (HCC) WRITTEN ON 10/22/2023  3:16 PM BY Deaja Rizo, DO  Sugars closer to goal today, with 77% of readings w/in range. Patient still has significant spikes unrelated to meals/snacks, and desires weightloss, will initiate GLP-1 therapy today -begin semaglutide  0.25 mg weekly, increase to 0.5 mg after 4 weeks if no adverse effects -Ambulatory referral to ophthalmology for diabetic eye exam -follow up in 3 months.

## 2023-10-22 NOTE — Assessment & Plan Note (Signed)
 Stable, chronic. ?

## 2023-10-22 NOTE — Assessment & Plan Note (Addendum)
-  TSH recheck today, reflex t4 if abnormal -will increase levothyroxine  if indicated

## 2023-10-22 NOTE — Assessment & Plan Note (Signed)
 Sugars closer to goal today, with 77% of readings w/in range. Patient still has significant spikes unrelated to meals/snacks, and desires weightloss, will initiate GLP-1 therapy today -begin semaglutide 0.25 mg weekly, increase to 0.5 mg after 4 weeks if no adverse effects -Ambulatory referral to ophthalmology for diabetic eye exam -follow up in 3 months.

## 2023-10-22 NOTE — Assessment & Plan Note (Signed)
 BP elevated in office today, however patient checks BP regularly at home and reports consistent readings in the 130s/80s -repeat BMET to check K and Cr -follow up in 3 months

## 2023-10-22 NOTE — Assessment & Plan Note (Signed)
 See DM management above. Stable, chronic

## 2023-10-22 NOTE — Progress Notes (Signed)
    SUBJECTIVE:   CHIEF COMPLAINT / HPI:   BP follow up: patient with mildly elevated BP at last visit, likely on too aggressive of BP therapy, could reduce further if needed. She reports taking her medication at the updated dose as prescribed, she has no concerns at this time.   Ketosis Prone Diabetes: Blood sugars 77% within goal since increase in insulin  therapy. Patient reports one true low, at 11 am today that was seen on her CGM. She ate a banana and had appropriate response. She also would like to discuss starting a GLP-1 for blood sugar control and weight loss. She has no history of MEN or pancreatitis.   Will recommend that patient schedule first medicare AWV, order dexa scan, and send referral to ophthalmology for diabetic eye exam  PERTINENT  PMH / PSH: T2DM, obesity, hypertension  OBJECTIVE:   BP (!) 167/86   Pulse 81   Ht 5\' 7"  (1.702 m)   Wt 242 lb 12.8 oz (110.1 kg)   SpO2 100%   BMI 38.03 kg/m   General: A&O, NAD Cardiac: RRR, no m/r/g Respiratory: CTAB, normal WOB, no w/c/r Extremities: NTTP, no peripheral edema.  ASSESSMENT/PLAN:   Assessment & Plan Hypertension associated with diabetes (HCC) BP elevated in office today, however patient checks BP regularly at home and reports consistent readings in the 130s/80s -repeat BMET to check K and Cr -follow up in 3 months  Hypothyroidism, unspecified type -TSH recheck today, reflex t4 if abnormal -will increase levothyroxine  if indicated Ketosis-prone diabetes mellitus (HCC) Sugars closer to goal today, with 77% of readings w/in range. Patient still has significant spikes unrelated to meals/snacks, and desires weightloss, will initiate GLP-1 therapy today -begin semaglutide 0.25 mg weekly, increase to 0.5 mg after 4 weeks if no adverse effects -Ambulatory referral to ophthalmology for diabetic eye exam -follow up in 3 months. Healthcare maintenance -Dexa scan ordered for osteoporosis screening Obesity, morbid  (HCC) See DM management above. Stable, chronic Chronic diastolic (congestive) heart failure (HCC) Stable, chronic. COPD mixed type (HCC) Stable, chronic.   Rayma Calandra, DO [facility]

## 2023-10-23 ENCOUNTER — Ambulatory Visit (INDEPENDENT_AMBULATORY_CARE_PROVIDER_SITE_OTHER): Admitting: Pharmacist

## 2023-10-23 ENCOUNTER — Encounter: Payer: Self-pay | Admitting: Pharmacist

## 2023-10-23 ENCOUNTER — Encounter: Payer: Self-pay | Admitting: Family Medicine

## 2023-10-23 VITALS — BP 121/69 | HR 84 | Wt 240.0 lb

## 2023-10-23 DIAGNOSIS — I152 Hypertension secondary to endocrine disorders: Secondary | ICD-10-CM | POA: Diagnosis not present

## 2023-10-23 DIAGNOSIS — E109 Type 1 diabetes mellitus without complications: Secondary | ICD-10-CM

## 2023-10-23 DIAGNOSIS — E1159 Type 2 diabetes mellitus with other circulatory complications: Secondary | ICD-10-CM

## 2023-10-23 LAB — BASIC METABOLIC PANEL WITH GFR
BUN/Creatinine Ratio: 24 (ref 12–28)
BUN: 21 mg/dL (ref 8–27)
CO2: 20 mmol/L (ref 20–29)
Calcium: 9.9 mg/dL (ref 8.7–10.3)
Chloride: 100 mmol/L (ref 96–106)
Creatinine, Ser: 0.86 mg/dL (ref 0.57–1.00)
Glucose: 181 mg/dL — ABNORMAL HIGH (ref 70–99)
Potassium: 5.4 mmol/L — ABNORMAL HIGH (ref 3.5–5.2)
Sodium: 139 mmol/L (ref 134–144)
eGFR: 75 mL/min/{1.73_m2} (ref >59)

## 2023-10-23 LAB — T4F: T4,Free (Direct): 1.1 ng/dL (ref 0.82–1.77)

## 2023-10-23 LAB — TSH RFX ON ABNORMAL TO FREE T4: TSH: 32.6 u[IU]/mL — ABNORMAL HIGH (ref 0.450–4.500)

## 2023-10-23 MED ORDER — INSULIN LISPRO (1 UNIT DIAL) 100 UNIT/ML (KWIKPEN): 10.0000 [IU] | PEN_INJECTOR | Freq: Three times a day (TID) | SUBCUTANEOUS | Status: DC

## 2023-10-23 MED ORDER — INSULIN GLARGINE 100 UNIT/ML SOLOSTAR PEN: 30.0000 [IU] | PEN_INJECTOR | Freq: Every day | SUBCUTANEOUS | Status: DC

## 2023-10-23 NOTE — Assessment & Plan Note (Signed)
>>  ASSESSMENT AND PLAN FOR KETOSIS-PRONE DIABETES MELLITUS (HCC) WRITTEN ON 10/23/2023 10:10 AM BY Drae Mitzel G, RPH-CPP  Diabetes longstanding since 2012 (13 years) currently uncontrolled but improving. Last A1c 10.5% in April but Dexcom GMI indicates improvement at 7.6%. Medication adherence appears good. Needs additional agent to help control BG. Per yesterday's visit, will initiate Ozempic  (semaglutide ) 0.25 mg once weekly. To prevent hypoglycemia with new agent, will decrease Lantus  (insulin  glargine) dose to 30 units daily, and Humalog  (insulin  lispro) dose to 10 units three time daily.  - Continue metformin  as prescribed. Counseled on weekly use and administration of Ozempic .

## 2023-10-23 NOTE — Progress Notes (Signed)
 S:     Chief Complaint  Patient presents with   Medication Management    Diabetes follow-up   68 y.o. female who presents for diabetes evaluation, education, and management. Patient arrives in good spirits and presents without any assistance.   Patient was referred and last seen by Primary Care Provider, Dr. Kassie Pais, on 10/22/23.  At last visit, Ozempic  (semaglutide ) was prescribed. Patient has not picked up or used Ozempic  yet.   PMH is significant for diabetes, HTN, HLD, HF, COPD.  Patient reports Diabetes was diagnosed in 2012.    Current diabetes medications include: Metformin  500 mg, Lantus  (insulin  glargine) 35 units daily, Humalog  (insulin  lispro) 12 units TID.  Current hypertension medications include: Losartan /hydrochlorothiazide  50/12.5 mg daily Current hyperlipidemia medications include: Rosuvastatin  20 mg daily  Patient reports adherence to taking all medications as prescribed.   Do you feel that your medications are working for you? yes Have you been experiencing any side effects to the medications prescribed? no  Insurance coverage: [employer]   Patient reports occasional hypoglycemic events, with last one occurring yesterday. Patient-reported exercise habits: Likes to get out of house and stay on the move.   O:   Review of Systems  Respiratory:  Positive for cough.   All other systems reviewed and are negative.   Physical Exam Constitutional:      Appearance: Normal appearance.  Pulmonary:     Effort: Pulmonary effort is normal.  Neurological:     Mental Status: She is alert.  Psychiatric:        Mood and Affect: Mood normal.        Behavior: Behavior normal.        Thought Content: Thought content normal.        Judgment: Judgment normal.    Dexcom G7 CGM Download today 10/10/23 - 10/23/23 % Time CGM is active: 98.4% Average Glucose: 181 mg/dL Glucose Management Indicator: 7.6%  Glucose Variability: 32.4% (goal <36%) Time in  Goal:  - Time in range 70-180: 53% - Time above range: 46% (12% very high) - Time below range: 1% Observed patterns: Spikes most commonly occur in morning or late evening.   Lab Results  Component Value Date   HGBA1C 10.5 (A) 10/08/2023   Vitals:   10/23/23 0912  BP: 121/69  Pulse: 84    Lipid Panel     Component Value Date/Time   CHOL 225 (H) 03/16/2023 1621   TRIG 267 (H) 03/16/2023 1621   HDL 57 03/16/2023 1621   CHOLHDL 3.9 03/16/2023 1621   CHOLHDL 2.9 03/20/2011 0840   VLDL 15 03/20/2011 0840   LDLCALC 121 (H) 03/16/2023 1621    Clinical Atherosclerotic Cardiovascular Disease (ASCVD): No true ASCVD, but has Heart Failure diagnosis The 10-year ASCVD risk score (Arnett DK, et al., 2019) is: 19.9%   Values used to calculate the score:     Age: 48 years     Sex: Female     Is Non-Hispanic African American: Yes     Diabetic: Yes     Tobacco smoker: No     Systolic Blood Pressure: 121 mmHg     Is BP treated: Yes     HDL Cholesterol: 57 mg/dL     Total Cholesterol: 225 mg/dL   A/P: Diabetes longstanding since 2012 (13 years) currently uncontrolled but improving. Last A1c 10.5% in April but Dexcom GMI indicates improvement at 7.6%. Medication adherence appears good. Needs additional agent to help control BG. Per yesterday's  visit, will initiate Ozempic  (semaglutide ) 0.25 mg once weekly. To prevent hypoglycemia with new agent, will decrease Lantus  (insulin  glargine) dose to 30 units daily, and Humalog  (insulin  lispro) dose to 10 units three time daily.  - Continue metformin  as prescribed. Counseled on weekly use and administration of Ozempic . Next A1c anticipated late July/Early August.   ASCVD risk - primary prevention in patient with diabetes. Last LDL is 121 not at goal of <82 mg/dL. ASCVD risk factors include smoking history (has quit), HTN, T2DM, HLD, and 10-year ASCVD risk score of 19.9%. high intensity statin indicated.  - Continue rosuvastatin  40 mg daily. Counseled  patient on role of therapy and importance of adherence.  - Repeat Lipid panel at next lab assessment  Hypertension currently controlled. Blood pressure goal of <130/80 mmHg. Medication adherence good. Regimen was recently decreased to losartan /hydrochlorothiazide  50/12.5 mg daily and BP is still controlled. - - - Continue losartan /hydrochlorothiazide  50/12.5 mg daily.  - Continue carvedilol  12.5mg  BID  Written patient instructions provided. Patient verbalized understanding of treatment plan.  Total time in face to face counseling 26 minutes.    Follow-up:  Pharmacist on 11/17/23 PCP clinic visit TBD.  Patient seen with Noah [place], PharmD Candidate and Volney Grumbles, PharmD, PGY-1 pharmacy resident.

## 2023-10-23 NOTE — Assessment & Plan Note (Signed)
 Hypertension currently controlled. Blood pressure goal of <130/80 mmHg. Medication adherence good. Regimen was recently decreased to losartan /hydrochlorothiazide  50/12.5 mg daily and BP is still controlled.  - Continue losartan /hydrochlorothiazide  50/12.5 mg daily.  - Continue carvedilol  12.5mg  BID

## 2023-10-23 NOTE — Patient Instructions (Addendum)
 It was nice to see you today!  Your goal blood sugar is 80-130 before eating and less than 180 after eating.  Medication Changes: START Ozempic  0.25 mg once weekly   Decrease Lantus  (insulin  glargine) to 30 units once daily, and decrease Humalog  (insulin  lispro) 10 units three times daily with meals.   Continue all other medication the same.  Keep up the good work with diet and exercise. Aim for a diet full of vegetables, fruit and lean meats (chicken, [place], fish). Try to limit salt intake by eating fresh or frozen vegetables (instead of canned), rinse canned vegetables prior to cooking and do not add any additional salt to meals.

## 2023-10-23 NOTE — Assessment & Plan Note (Signed)
 Diabetes longstanding since 2012 (13 years) currently uncontrolled but improving. Last A1c 10.5% in April but Dexcom GMI indicates improvement at 7.6%. Medication adherence appears good. Needs additional agent to help control BG. Per yesterday's visit, will initiate Ozempic  (semaglutide ) 0.25 mg once weekly. To prevent hypoglycemia with new agent, will decrease Lantus  (insulin  glargine) dose to 30 units daily, and Humalog  (insulin  lispro) dose to 10 units three time daily.  - Continue metformin  as prescribed. Counseled on weekly use and administration of Ozempic .

## 2023-10-26 ENCOUNTER — Telehealth: Payer: Self-pay | Admitting: Family Medicine

## 2023-10-26 ENCOUNTER — Other Ambulatory Visit: Payer: Self-pay

## 2023-10-26 DIAGNOSIS — E109 Type 1 diabetes mellitus without complications: Secondary | ICD-10-CM

## 2023-10-26 DIAGNOSIS — E039 Hypothyroidism, unspecified: Secondary | ICD-10-CM

## 2023-10-26 DIAGNOSIS — I152 Hypertension secondary to endocrine disorders: Secondary | ICD-10-CM

## 2023-10-26 MED ORDER — BD PEN NEEDLE NANO 2ND GEN 32G X 4 MM MISC: 1.0000 | Freq: Four times a day (QID) | 3 refills | Status: DC

## 2023-10-26 MED ORDER — LEVOTHYROXINE SODIUM 200 MCG PO TABS: 200.0000 ug | ORAL_TABLET | Freq: Every day | ORAL | 0 refills | Status: DC

## 2023-10-26 NOTE — Telephone Encounter (Signed)
 Called patient and after confirming name and DOB, discussed recent test results. Patient is agreeable to repeat labs tomorrow, appointment is scheduled. Initially discussed holding Hyzaar, however, patient reported symptomatic hypertension last night. Her home BP cuff read 190/110 and she had a headache. This morning her BP was 143/87, and she is not having symptoms. Recommended that patient go to emergency department if her symptoms return.   Also informed patient that her levothyroxine  needed to be increased to 200 mg based on recent labs. Patient agreed to medication change.

## 2023-10-27 ENCOUNTER — Other Ambulatory Visit

## 2023-10-27 ENCOUNTER — Telehealth: Payer: Self-pay | Admitting: Family Medicine

## 2023-10-27 DIAGNOSIS — E1159 Type 2 diabetes mellitus with other circulatory complications: Secondary | ICD-10-CM | POA: Diagnosis not present

## 2023-10-27 DIAGNOSIS — I152 Hypertension secondary to endocrine disorders: Secondary | ICD-10-CM | POA: Diagnosis not present

## 2023-10-27 DIAGNOSIS — Z8521 Personal history of malignant neoplasm of larynx: Secondary | ICD-10-CM | POA: Diagnosis not present

## 2023-10-27 NOTE — Telephone Encounter (Signed)
 Patient came in for lab work today, and asked if she is able to take her blood pressure medicine again. Stated Dr. Annabell Key had asked to her to stop taking it

## 2023-10-27 NOTE — Telephone Encounter (Signed)
Routed message to PCP. Jacorie Ernsberger, CMA  

## 2023-10-28 ENCOUNTER — Other Ambulatory Visit: Payer: Self-pay | Admitting: Family Medicine

## 2023-10-28 DIAGNOSIS — F339 Major depressive disorder, recurrent, unspecified: Secondary | ICD-10-CM

## 2023-10-28 LAB — BASIC METABOLIC PANEL WITH GFR
BUN/Creatinine Ratio: 21 (ref 12–28)
BUN: 22 mg/dL (ref 8–27)
CO2: 20 mmol/L (ref 20–29)
Calcium: 10 mg/dL (ref 8.7–10.3)
Chloride: 102 mmol/L (ref 96–106)
Creatinine, Ser: 1.07 mg/dL — ABNORMAL HIGH (ref 0.57–1.00)
Glucose: 80 mg/dL (ref 70–99)
Potassium: 4.7 mmol/L (ref 3.5–5.2)
Sodium: 140 mmol/L (ref 134–144)
eGFR: 58 mL/min/{1.73_m2} — ABNORMAL LOW (ref >59)

## 2023-10-28 NOTE — Progress Notes (Signed)
 Reviewed and agree with Dr Macky Lower plan.

## 2023-10-28 NOTE — Telephone Encounter (Signed)
 Reviewed patient's test results, and called patient at number provided in the chart. No answer, left HIPAA compliant voicemail. Will send MyChart message and route communication to staff in case Ms. Bensman calls back.

## 2023-11-09 ENCOUNTER — Other Ambulatory Visit: Payer: Self-pay | Admitting: Family Medicine

## 2023-11-09 DIAGNOSIS — E039 Hypothyroidism, unspecified: Secondary | ICD-10-CM

## 2023-11-11 ENCOUNTER — Other Ambulatory Visit: Payer: Self-pay | Admitting: Family Medicine

## 2023-11-11 DIAGNOSIS — E109 Type 1 diabetes mellitus without complications: Secondary | ICD-10-CM

## 2023-11-13 ENCOUNTER — Telehealth: Payer: Self-pay

## 2023-11-13 DIAGNOSIS — E109 Type 1 diabetes mellitus without complications: Secondary | ICD-10-CM

## 2023-11-13 MED ORDER — SEMAGLUTIDE(0.25 OR 0.5MG/DOS) 2 MG/1.5ML ~~LOC~~ SOPN: PEN_INJECTOR | SUBCUTANEOUS | 3 refills | Status: DC

## 2023-11-13 NOTE — Telephone Encounter (Signed)
Resent prescription with updated sig.

## 2023-11-13 NOTE — Telephone Encounter (Signed)
 [facility] calls nurse line in regards to Ozempic .   The representative stated several faxes were faxed over in regards to "changing" directions on prescription. She reports since they did not receive an updated prescription back, the 5/8 prescription was cancelled out.   After waiting on hold for the pharmacist for ~20 minutes the call was disconnected by me.   Will forward to PCP.

## 2023-11-17 ENCOUNTER — Encounter: Payer: Self-pay | Admitting: Pharmacist

## 2023-11-17 ENCOUNTER — Ambulatory Visit (INDEPENDENT_AMBULATORY_CARE_PROVIDER_SITE_OTHER): Admitting: Pharmacist

## 2023-11-17 VITALS — BP 92/58 | HR 70 | Wt 239.0 lb

## 2023-11-17 DIAGNOSIS — E109 Type 1 diabetes mellitus without complications: Secondary | ICD-10-CM

## 2023-11-17 DIAGNOSIS — R1032 Left lower quadrant pain: Secondary | ICD-10-CM

## 2023-11-17 DIAGNOSIS — E1159 Type 2 diabetes mellitus with other circulatory complications: Secondary | ICD-10-CM

## 2023-11-17 DIAGNOSIS — I152 Hypertension secondary to endocrine disorders: Secondary | ICD-10-CM | POA: Diagnosis not present

## 2023-11-17 MED ORDER — INSULIN GLARGINE 100 UNIT/ML SOLOSTAR PEN: 26.0000 [IU] | PEN_INJECTOR | Freq: Every day | SUBCUTANEOUS | Status: DC

## 2023-11-17 MED ORDER — SEMAGLUTIDE(0.25 OR 0.5MG/DOS) 2 MG/3ML ~~LOC~~ SOPN
0.2500 mg | PEN_INJECTOR | SUBCUTANEOUS | 6 refills | Status: AC
Start: 2023-11-17 — End: ?

## 2023-11-17 MED ORDER — ONDANSETRON HCL 4 MG PO TABS: 4.0000 mg | ORAL_TABLET | Freq: Three times a day (TID) | ORAL | 0 refills | Status: DC | PRN

## 2023-11-17 MED ORDER — LOSARTAN POTASSIUM 50 MG PO TABS
50.0000 mg | ORAL_TABLET | Freq: Every day | ORAL | 3 refills | Status: DC
Start: 2023-11-17 — End: 2024-03-29

## 2023-11-17 NOTE — Assessment & Plan Note (Signed)
 Diabetes longstanding since 2012 (13 years) currently with improving control per CGM -  Dexcom GMI indicates improvement at 7.0% which is slightly improved from last 7.6. Medication adherence appears good. Unable to gain access to Ozempic  (semaglutide ).  - Reordered Ozempic  (semaglutide ) 0.25 mg once weekly in available pen type. To prevent hypoglycemia with new agent, will decrease Lantus  (insulin  glargine) dose from 30  to 26 units daily. - Continue Humalog  (insulin  lispro) dose to 10 units three time daily.  - Continue metformin  as prescribed. Counseled on weekly use and administration of Ozempic . Next A1c anticipated late July/Early August.

## 2023-11-17 NOTE — Assessment & Plan Note (Signed)
 Hypertension currently well controlled. Blood pressure low on initial and repeat assessment. Medication adherence good.  - Discontinue Losartan  50mg  / hydrochlorothiazide   2.5mg  combination and START Losartan  50mg  once daily  - Continue carvedilol  12.5mg  BID

## 2023-11-17 NOTE — Patient Instructions (Signed)
 It was nice to see you today!  Your Dexcom Report looks "good" It will look better if we can get you the Ozempic   Your goal blood sugar is 80-130 before eating and less than 180 after eating.  Medication Changes: Change your blood pressure Medication  STOP Losartan  hydrochlorothiazide  and START Losartan  50mg  once daily  Decrease LANTUS  (insulin  glargine) from 30 to 26 units once daily START Ozempic  (semaglutide ) 0.25mg  once weekly  Continue all other medication the same.   Monitor blood sugars at home and keep a log (glucometer or piece of paper) to bring with you to your next visit.  Keep up the good work with diet and exercise. Aim for a diet full of vegetables, fruit and lean meats (chicken, [place], fish). Try to limit salt intake by eating fresh or frozen vegetables (instead of canned), rinse canned vegetables prior to cooking and do not add any additional salt to meals.

## 2023-11-17 NOTE — Progress Notes (Signed)
 S:     Chief Complaint  Patient presents with   Medication Management    Diabetes - Hypertension   68 y.o. female who presents for diabetes evaluation, education, and management. Patient arrives in good spirits and presents without any assistance.   Patient was referred and last seen by Primary Care Provider, Dr. Kassie Pais, on 10/22/2023. Deborah [name]   PMH is significant for diabetes, HTN, HLD, HF, COPD.  Patient reports Diabetes was diagnosed in 2012.      Current diabetes medications include: Metformin  XR 500 mg once daily,  Lantus  (insulin  glargine) 30 units daily, Humalog  (insulin  lispro) 10 units TID, has not yet started Ozempic  (semaglutide ) due to insurance supply/prescription issue.  Current hypertension medications include: losartan /hydrochlorothiazide  50/12.5 mg daily Current hyperlipidemia medications include: rosuvastatin  40 mg daily  Patient reports adherence to taking all medications as prescribed.   Do you feel that your medications are working for you? yes Have you been experiencing any side effects to the medications prescribed? no  Patient denies hypoglycemic events.  Patient reported dietary habits: Eats 3 meals/day   O:   Review of Systems  All other systems reviewed and are negative.   Physical Exam Vitals reviewed.  Neurological:     Mental Status: She is alert.    Libre3 CGM Download today  % Time CGM is active: 98% Average Glucose: 154 mg/dL Glucose Management Indicator: 7  Glucose Variability: 26% (goal <36%) Time in Goal:  - Time in range 70-180: 73% - Time above range: 25% (1%> 250mg .dl) - Time below range: 0% but has a few low readings Observed patterns: overall good control   Lab Results  Component Value Date   HGBA1C 10.5 (A) 10/08/2023   Vitals:   11/17/23 1050  Pulse: 70  SpO2: 94%    Lipid Panel     Component Value Date/Time   CHOL 225 (H) 03/16/2023 1621   TRIG 267 (H) 03/16/2023 1621   HDL 57 03/16/2023 1621   CHOLHDL  3.9 03/16/2023 1621   CHOLHDL 2.9 03/20/2011 0840   VLDL 15 03/20/2011 0840   LDLCALC 121 (H) 03/16/2023 1621    Clinical Atherosclerotic Cardiovascular Disease (ASCVD):  The 10-year ASCVD risk score (Arnett DK, et al., 2019) is: 19.9%   Values used to calculate the score:     Age: 84 years     Sex: Female     Is Non-Hispanic African American: Yes     Diabetic: Yes     Tobacco smoker: No     Systolic Blood Pressure: 121 mmHg     Is BP treated: Yes     HDL Cholesterol: 57 mg/dL     Total Cholesterol: 225 mg/dL     A/P: Diabetes longstanding since 2012 (13 years) currently with improving control per CGM -  Dexcom GMI indicates improvement at 7.0% which is slightly improved from last 7.6. Medication adherence appears good. Unable to gain access to Ozempic  (semaglutide ).  - Reordered Ozempic  (semaglutide ) 0.25 mg once weekly in available pen type. To prevent hypoglycemia with new agent, will decrease Lantus  (insulin  glargine) dose from 30  to 26 units daily. - Continue Humalog  (insulin  lispro) dose to 10 units three time daily.  - Continue metformin  as prescribed. Counseled on weekly use and administration of Ozempic . Next A1c anticipated late July/Early August.    Hypertension currently well controlled. Blood pressure low on initial and repeat assessment. Medication adherence good.  - Discontinue Losartan  50mg  / hydrochlorothiazide   2.5mg  combination and START  Losartan  50mg  once daily  - Continue carvedilol  12.5mg  BID  Written patient instructions provided. Patient verbalized understanding of treatment plan.  Total time in face to face counseling 26 minutes.    Follow-up:  Pharmacist PRN PCP clinic visit in patient to schedule in 1-2 months.

## 2023-11-17 NOTE — Assessment & Plan Note (Signed)
>>  ASSESSMENT AND PLAN FOR KETOSIS-PRONE DIABETES MELLITUS (HCC) WRITTEN ON 11/17/2023  1:45 PM BY Bannon Giammarco G, RPH-CPP  Diabetes longstanding since 2012 (13 years) currently with improving control per CGM -  Dexcom GMI indicates improvement at 7.0% which is slightly improved from last 7.6. Medication adherence appears good. Unable to gain access to Ozempic  (semaglutide ).  - Reordered Ozempic  (semaglutide ) 0.25 mg once weekly in available pen type. To prevent hypoglycemia with new agent, will decrease Lantus  (insulin  glargine) dose from 30  to 26 units daily. - Continue Humalog  (insulin  lispro) dose to 10 units three time daily.  - Continue metformin  as prescribed. Counseled on weekly use and administration of Ozempic . Next A1c anticipated late July/Early August.

## 2023-11-18 NOTE — Progress Notes (Signed)
 Reviewed and agree with Dr Macky Lower plan.

## 2023-11-20 DIAGNOSIS — Z96652 Presence of left artificial knee joint: Secondary | ICD-10-CM | POA: Diagnosis not present

## 2023-11-20 DIAGNOSIS — C321 Malignant neoplasm of supraglottis: Secondary | ICD-10-CM | POA: Diagnosis not present

## 2023-11-20 DIAGNOSIS — Z930 Tracheostomy status: Secondary | ICD-10-CM | POA: Diagnosis not present

## 2023-11-20 DIAGNOSIS — C329 Malignant neoplasm of larynx, unspecified: Secondary | ICD-10-CM | POA: Diagnosis not present

## 2023-11-20 DIAGNOSIS — M1712 Unilateral primary osteoarthritis, left knee: Secondary | ICD-10-CM | POA: Diagnosis not present

## 2023-12-02 ENCOUNTER — Other Ambulatory Visit: Payer: Self-pay

## 2023-12-02 ENCOUNTER — Telehealth: Payer: Self-pay

## 2023-12-02 DIAGNOSIS — E109 Type 1 diabetes mellitus without complications: Secondary | ICD-10-CM

## 2023-12-02 DIAGNOSIS — J4540 Moderate persistent asthma, uncomplicated: Secondary | ICD-10-CM

## 2023-12-02 MED ORDER — SEMAGLUTIDE(0.25 OR 0.5MG/DOS) 2 MG/3ML ~~LOC~~ SOPN
0.2500 mg | PEN_INJECTOR | SUBCUTANEOUS | 6 refills | Status: DC
Start: 2023-12-02 — End: 2024-01-25

## 2023-12-02 MED ORDER — DEXCOM G7 RECEIVER DEVI
1.0000 | 10 refills | Status: DC
Start: 2023-12-02 — End: 2024-03-05

## 2023-12-02 NOTE — Telephone Encounter (Signed)
 Patient calls nurse line requesting refill on Dexcom receiver and Ozempic .   She reports that [facility] is not able to dispense her Ozempic  and would like this to be sent to local pharmacy. Called and cancelled at [facility]. Requesting that Ozempic  be sent to [facility] pharmacy.  Requesting that Dexcom receiver be sent to [facility].   Pended medications to this encounter.   Elsie Halo, RN

## 2023-12-02 NOTE — Telephone Encounter (Signed)
 Reviewed and agree with Dr Macky Lower plan.

## 2023-12-03 MED ORDER — SEMAGLUTIDE(0.25 OR 0.5MG/DOS) 2 MG/3ML ~~LOC~~ SOPN
0.2500 mg | PEN_INJECTOR | SUBCUTANEOUS | 0 refills | Status: DC
Start: 2023-12-03 — End: 2024-01-25

## 2023-12-03 MED ORDER — SEMAGLUTIDE(0.25 OR 0.5MG/DOS) 2 MG/3ML ~~LOC~~ SOPN
0.2500 mg | PEN_INJECTOR | SUBCUTANEOUS | 6 refills | Status: DC
Start: 2023-12-03 — End: 2023-12-03

## 2023-12-03 NOTE — Addendum Note (Signed)
 Addended by: Thang Flett on: 12/03/2023 02:28 PM   Modules accepted: Orders

## 2023-12-03 NOTE — Telephone Encounter (Signed)
 Patient contacted for follow-up of request for new Ozempic  (semaglutide ) prescription and need for new Dexcom receiver.   Since last contact patient reports she has NOT yet been able to obtain Ozempic  and requests the prescription be sent to her local pharmacy of choice - [facility] - New prescription provided 0.25mg  once weekly.   She also requests help with new Dexcom Receiver.  - Labeled sample Dexcom receiver for pick up tomorrow.   Total time with patient call and documentation of interaction: 11 minutes.

## 2023-12-04 NOTE — Telephone Encounter (Signed)
 Reviewed and agree with Dr Macky Lower plan.

## 2023-12-20 DIAGNOSIS — M1712 Unilateral primary osteoarthritis, left knee: Secondary | ICD-10-CM | POA: Diagnosis not present

## 2023-12-20 DIAGNOSIS — Z96652 Presence of left artificial knee joint: Secondary | ICD-10-CM | POA: Diagnosis not present

## 2023-12-20 DIAGNOSIS — C321 Malignant neoplasm of supraglottis: Secondary | ICD-10-CM | POA: Diagnosis not present

## 2023-12-20 DIAGNOSIS — Z930 Tracheostomy status: Secondary | ICD-10-CM | POA: Diagnosis not present

## 2023-12-20 DIAGNOSIS — C329 Malignant neoplasm of larynx, unspecified: Secondary | ICD-10-CM | POA: Diagnosis not present

## 2023-12-24 DIAGNOSIS — H268 Other specified cataract: Secondary | ICD-10-CM | POA: Diagnosis not present

## 2023-12-24 DIAGNOSIS — Z01818 Encounter for other preprocedural examination: Secondary | ICD-10-CM | POA: Diagnosis not present

## 2023-12-29 ENCOUNTER — Telehealth: Payer: Self-pay

## 2023-12-29 NOTE — Telephone Encounter (Signed)
 Received surgical assessment from [facility].  Pt last seen 07/2023. Office protocol is that patient must be seen within 60 days or less of assessment. App is needed.   Lm for patient to schedule appt.

## 2023-12-30 ENCOUNTER — Ambulatory Visit: Admitting: Pulmonary Disease

## 2023-12-30 ENCOUNTER — Encounter: Payer: Self-pay | Admitting: Pulmonary Disease

## 2023-12-30 VITALS — BP 114/75 | HR 83 | Ht 67.0 in | Wt 241.6 lb

## 2023-12-30 DIAGNOSIS — J432 Centrilobular emphysema: Secondary | ICD-10-CM | POA: Diagnosis not present

## 2023-12-30 DIAGNOSIS — Z930 Tracheostomy status: Secondary | ICD-10-CM | POA: Diagnosis not present

## 2023-12-30 DIAGNOSIS — Z87891 Personal history of nicotine dependence: Secondary | ICD-10-CM

## 2023-12-30 MED ORDER — REVEFENACIN 175 MCG/3ML IN SOLN: 175.0000 ug | Freq: Every day | RESPIRATORY_TRACT | 3 refills | Status: DC

## 2023-12-30 NOTE — Patient Instructions (Addendum)
 Continue budesonide  neb twice daily  Continue formoterol  nebulizer twice daily  Add yupelri  nebulizer daily  Follow up in 6 months, call sooner if needed  For your Surgery Clearance: ARISCAT Score for Postoperative Pulmonary Complications Predicts risk of pulmonary complications after surgery, including respiratory failure.  Intermediate risk 13.3% risk of in-hospital post-op pulmonary complications (composite including respiratory failure, respiratory infection, pleural effusion, atelectasis, pneumothorax, bronchospasm, aspiration pneumonitis)

## 2023-12-30 NOTE — Progress Notes (Signed)
 Synopsis: Referred in December 2022 for Foreign Body in Bronchus by Dr. 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   She has been doing well on budesonide  nebs twice daily, formoterol  nebs twice daily and yupelri  neb daily.   She does have an upcoming surgery scheduled and is requesting pre-op evaluation.   Past Medical History:  Diagnosis Date   Anemia    h/o of   Anxiety    Arthritis    in knees   Asthma    Breast cancer (HCC)    Bronchitis    Carpal tunnel syndrome 03/16/2018   Chronic pain 01/20/2012   Patient with chronic pain at stoma site. Seen by ENT (Dr. Jesus) who recommends pain management. 12/22/11. She is currently on Vicodin and fentanyl .   04/07/13: denies stoma pain. Currently on flexeril , mobic  and tramadol  for b/l shoulder and neck pains .   COPD (chronic obstructive pulmonary disease) (HCC)    Depression    Diabetes mellitus without complication (HCC)    Diverticulosis 11/15/2012   noted on screening colonoscopy    DOE (dyspnea on exertion) 04/28/2014   Esophageal stricture    FB bronchus    Former smoker 03/19/2011   GERD (gastroesophageal reflux disease)    Heart murmur    asymptomatic    History of laryngectomy    History of radiation therapy 11/15/18- 12/06/18   Right Breast total dose 42.56 Gy in 16 fractions.    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   Leukocytopenia    Nausea alone 07/28/2013   Neck pain 01/21/2012   Normal MRI 07/14/2011    negative for mestasis    Plantar fasciitis 06/06/2016   Pneumonia 2012   RUQ abdominal pain 08/08/2022   On exam patient with marked tenderness to percussion and palpation RUQ. Obesity hinder palpation  PLan   Sciatica    Seizures (HCC) 06/29/2011   07/24/11 off Effexor w/o seizure   Sepsis (HCC) 08/04/2012   Sinusitis, chronic 07/20/2011   Bilateral maxillary, identified on MRI of head 07/14/11.     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HCC)      Family History  Problem Relation Age of Onset   Heart disease Mother    Heart disease Father    Heart disease Sister    Cancer Sister        type unknown   Diabetes Sister    Cancer Brother        type unknown   Liver disease Maternal Grandmother    Breast cancer Neg Hx    Colon cancer Neg Hx    Esophageal cancer Neg Hx    Rectal cancer Neg Hx    Stomach cancer Neg Hx    Colon polyps Neg Hx      Social History   Socioeconomic History   Marital status: Married    Spouse name: Not on file   Number of children: 2   Years of education: 12   Highest education level: Not on file  Occupational  History   Occupation: part time    Comment: warehouse work  Tobacco Use   Smoking status: Former    Current packs/day: 0.00    Average packs/day: 2.0 packs/day for 20.0 years (40.0 ttl pk-yrs)    Types: Cigarettes    Start date: 09/17/1990    Quit date: 09/17/2010    Years since quitting: 13.3   Smokeless tobacco: Never  Vaping Use   Vaping status: Never Used  Substance and Sexual Activity   Alcohol use: Yes    Comment: Socially drinks    Drug use: No    Comment: tried cocaine  1 time 2010 only used 1 time   Sexual activity: Yes    Birth control/protection: Surgical  Other Topics Concern   Not on file  Social History Narrative   Lives with boyfriend   Social Drivers of Health   Financial Resource Strain: Not on file  Food Insecurity: Low Risk  (08/19/2022)   Received from [facility]   Hunger Vital Sign    Within the past 12 months, you  worried that your food would run out before you got money to buy more: Never true    Within the past 12 months, the food you bought just didn't last and you didn't have money to get more. : Never true  Transportation Needs: No Transportation Needs (08/19/2022)   Received from [facility]    In the past 12 months, has lack of reliable transportation kept you from medical appointments, meetings, work or from getting things needed for daily living? : No  Physical Activity: Not on file  Stress: Not on file  Social Connections: Not on file  Intimate Partner Violence: Not At Risk (08/09/2022)   Humiliation, Afraid, Rape, and Kick questionnaire    Fear of Current or Ex-Partner: No    Emotionally Abused: No    Physically Abused: No    Sexually Abused: No     Allergies  Allergen Reactions   Lisinopril  Cough     Outpatient Medications Prior to Visit  Medication Sig Dispense Refill   budesonide  (PULMICORT ) 0.5 MG/2ML nebulizer solution Take 2 mLs (0.5 mg total) by nebulization daily. 120 mL 11   buPROPion  (WELLBUTRIN  XL) 150 MG 24 hr tablet TAKE 1 TABLET BY MOUTH DAILY 30 tablet 11   carvedilol  (COREG ) 12.5 MG tablet Take 1 tablet (12.5 mg total) by mouth 2 (two) times daily with a meal. 180 tablet 3   Continuous Glucose Receiver (DEXCOM G7 RECEIVER) DEVI Apply 1 each topically See admin instructions. 1 each 10   Continuous Glucose Sensor (DEXCOM G7 SENSOR) MISC APPLY NEW SENSOR EVERY 10 DAYS 5 each 6   EQ ALLERGY RELIEF, CETIRIZINE , 10 MG tablet Take 1 tablet by mouth once daily 90 tablet 0   escitalopram  (LEXAPRO ) 20 MG tablet Take 1 tablet (20 mg total) by mouth daily. 90 tablet 1   fluticasone  (FLONASE ) 50 MCG/ACT nasal spray Place 1 spray into both nostrils 2 (two) times daily. 9.9 mL 0   formoterol  (PERFOROMIST ) 20 MCG/2ML nebulizer solution Take 2 mLs (20 mcg total) by nebulization 2 (two) times daily. 120 mL 6   Humidifiers MISC 1 application by Tracheal Tube route as  needed. 1 each 0   insulin  glargine (LANTUS ) 100 UNIT/ML Solostar Pen Inject 26 Units into the skin daily.     insulin  lispro (HUMALOG  KWIKPEN) 100 UNIT/ML KwikPen Inject 10 Units into the skin 3 (three) times daily.     Insulin  Pen Needle (  BD PEN NEEDLE NANO 2ND GEN) 32G X 4 MM MISC Inject 1 each into the skin 4 (four) times daily. 100 each 3   levothyroxine  (SYNTHROID ) 200 MCG tablet TAKE 1 TABLET BY MOUTH DAILY  BEFORE BREAKFAST 30 tablet 11   losartan  (COZAAR ) 50 MG tablet Take 1 tablet (50 mg total) by mouth daily. 90 tablet 3   metFORMIN  (GLUCOPHAGE -XR) 500 MG 24 hr tablet TAKE 1 TABLET BY MOUTH DAILY  WITH BREAKFAST 30 tablet 11   montelukast  (SINGULAIR ) 10 MG tablet TAKE 1 TABLET BY MOUTH AT BEDTIME 90 tablet 0   nystatin  cream (MYCOSTATIN ) Apply 1 Application topically daily as needed (rash under breast stomach).     ondansetron  (ZOFRAN ) 4 MG tablet Take 1 tablet (4 mg total) by mouth every 8 (eight) hours as needed for nausea or vomiting. 20 tablet 0   Semaglutide ,0.25 or 0.5MG /DOS, 2 MG/3ML SOPN Inject 0.25 mg into the skin once a week. 3 mL 6   Semaglutide ,0.25 or 0.5MG /DOS, 2 MG/3ML SOPN Inject 0.25 mg into the skin once a week. 3 mL 0   sodium chloride  HYPERTONIC 3 % nebulizer solution Take by nebulization 3 (three) times daily. (Patient taking differently: Take 4 mLs by nebulization 3 (three) times daily.) 750 mL 5   ipratropium (ATROVENT ) 0.02 % nebulizer solution Take 2.5 mLs (0.5 mg total) by nebulization 4 (four) times daily.     rosuvastatin  (CRESTOR ) 40 MG tablet Take 1 tablet (40 mg total) by mouth daily. 30 tablet 3   No facility-administered medications prior to visit.   Review of Systems  Constitutional:  Negative for chills, fever, malaise/fatigue and weight loss.  HENT:  Negative for congestion, sinus pain and sore throat.   Eyes: Negative.   Respiratory:  Positive for shortness of breath. Negative for cough, hemoptysis, sputum production and wheezing.    Cardiovascular:  Negative for chest pain, palpitations, orthopnea, claudication and leg swelling.  Gastrointestinal:  Negative for abdominal pain, heartburn, nausea and vomiting.  Genitourinary: Negative.   Musculoskeletal:  Negative for joint pain and myalgias.  Skin:  Negative for rash.  Neurological:  Negative for weakness.  Endo/Heme/Allergies: Negative.   Psychiatric/Behavioral: Negative.      Objective:   Vitals:   12/30/23 1542  BP: 114/75  Pulse: 83  SpO2: 94%  Weight: 241 lb 9.6 oz (109.6 kg)  Height: 5' 7 (1.702 m)    Physical Exam Constitutional:      General: She is not in acute distress.    Appearance: She is not ill-appearing.  HENT:     Head: Normocephalic and atraumatic.     Mouth/Throat:     Comments: Laryngectomy stoma Cardiovascular:     Rate and Rhythm: Normal rate and regular rhythm.     Pulses: Normal pulses.     Heart sounds: Normal heart sounds. No murmur heard. Pulmonary:     Effort: Pulmonary effort is normal.     Breath sounds: No wheezing, rhonchi or rales.  Musculoskeletal:     Right lower leg: No edema.     Left lower leg: No edema.  Skin:    General: Skin is warm and dry.  Neurological:     General: No focal deficit present.     Mental Status: She is alert.     CBC    Component Value Date/Time   WBC 6.4 09/29/2023 0952   RBC 4.30 09/29/2023 0952   HGB 12.7 09/29/2023 0952   HGB 12.7 09/29/2023 0931   HGB 13.4 08/13/2023  1456   HGB 11.9 08/20/2015 1200   HCT 38.8 09/29/2023 0952   HCT 40.8 08/13/2023 1456   HCT 37.3 08/20/2015 1200   PLT 335 09/29/2023 0952   PLT 371 08/13/2023 1456   MCV 90.2 09/29/2023 0952   MCV 93 08/13/2023 1456   MCV 91.8 08/20/2015 1200   MCH 29.5 09/29/2023 0952   MCHC 32.7 09/29/2023 0952   RDW 12.4 09/29/2023 0952   RDW 12.4 08/13/2023 1456   RDW 14.4 08/20/2015 1200   LYMPHSABS 1.9 09/29/2023 0952   LYMPHSABS 1.2 08/20/2015 1200   MONOABS 0.5 09/29/2023 0952   MONOABS 0.3 08/20/2015  1200   EOSABS 0.4 09/29/2023 0952   EOSABS 0.2 08/20/2015 1200   BASOSABS 0.1 09/29/2023 0952   BASOSABS 0.1 08/20/2015 1200      Latest Ref Rng & Units 10/27/2023    4:56 PM 10/22/2023    4:49 PM 09/29/2023    9:52 AM  BMP  Glucose 70 - 99 mg/dL 80  818  866   BUN 8 - 27 mg/dL 22  21  17    Creatinine 0.57 - 1.00 mg/dL 8.92  9.13  8.37   BUN/Creat Ratio 12 - 28 21  24     Sodium 134 - 144 mmol/L 140  139  138   Potassium 3.5 - 5.2 mmol/L 4.7  5.4  4.1   Chloride 96 - 106 mmol/L 102  100  103   CO2 20 - 29 mmol/L 20  20  22    Calcium  8.7 - 10.3 mg/dL 89.9  9.9  89.9    Chest imaging: CTA 08/21/23 Mediastinum/Nodes: The patient is status post tracheostomy. The trachea is patent and midline. Normal appearance of the esophagus. No mediastinal or hilar adenopathy.   Lungs/Pleura: Complete atelectasis of the left lower lobe is again noted with asymmetric volume loss from the left lung. This has persisted and appears unchanged compared with 05/02/2021. Emphysema with diffuse bronchial wall thickening. No suspicious pulmonary nodule or mass identified.  PFT:     No data to display          Labs:  Path:  Echo 08/31/17: LVEF 65 to 70%.  Grade 2 diastolic dysfunction.  Normal size and function.  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  Asthma/COPD -Continue current long-acting nebulizer regimen: budesonide , yupelri  and brovana -caution with steroid tapers in the future due to her diabetes  Pre-Op Evaluation ARISCAT Score for Postoperative Pulmonary Complications Predicts risk of pulmonary complications after surgery, including respiratory failure. Intermediate risk 13.3% risk of in-hospital  post-op pulmonary complications (composite including respiratory failure, respiratory infection, pleural effusion, atelectasis, pneumothorax, bronchospasm, aspiration pneumonitis)  Allergies - continue zyrtec  and montelukast  - continue fluticasone  nasal spray - ipratropium nasal spray PRN  Follow-up in 6 months.  Dorn Chill, MD [facility] Office: [phone]    Current Outpatient Medications:    budesonide  (PULMICORT ) 0.5 MG/2ML nebulizer solution, Take 2 mLs (0.5 mg total) by nebulization daily., Disp: 120 mL, Rfl: 11   buPROPion  (WELLBUTRIN  XL) 150 MG 24 hr tablet, TAKE 1 TABLET BY MOUTH DAILY, Disp: 30 tablet, Rfl: 11   carvedilol  (COREG ) 12.5 MG tablet, Take 1 tablet (12.5 mg total) by mouth 2 (two) times daily with a meal., Disp:  180 tablet, Rfl: 3   Continuous Glucose Receiver (DEXCOM G7 RECEIVER) DEVI, Apply 1 each topically See admin instructions., Disp: 1 each, Rfl: 10   Continuous Glucose Sensor (DEXCOM G7 SENSOR) MISC, APPLY NEW SENSOR EVERY 10 DAYS, Disp: 5 each, Rfl: 6   EQ ALLERGY RELIEF, CETIRIZINE , 10 MG tablet, Take 1 tablet by mouth once daily, Disp: 90 tablet, Rfl: 0   escitalopram  (LEXAPRO ) 20 MG tablet, Take 1 tablet (20 mg total) by mouth daily., Disp: 90 tablet, Rfl: 1   fluticasone  (FLONASE ) 50 MCG/ACT nasal spray, Place 1 spray into both nostrils 2 (two) times daily., Disp: 9.9 mL, Rfl: 0   formoterol  (PERFOROMIST ) 20 MCG/2ML nebulizer solution, Take 2 mLs (20 mcg total) by nebulization 2 (two) times daily., Disp: 120 mL, Rfl: 6   Humidifiers MISC, 1 application by Tracheal Tube route as needed., Disp: 1 each, Rfl: 0   insulin  glargine (LANTUS ) 100 UNIT/ML Solostar Pen, Inject 26 Units into the skin daily., Disp: , Rfl:    insulin  lispro (HUMALOG  KWIKPEN) 100 UNIT/ML KwikPen, Inject 10 Units into the skin 3 (three) times daily., Disp: , Rfl:    Insulin  Pen Needle (BD PEN NEEDLE NANO 2ND GEN) 32G X 4 MM MISC, Inject 1 each into the skin  4 (four) times daily., Disp: 100 each, Rfl: 3   levothyroxine  (SYNTHROID ) 200 MCG tablet, TAKE 1 TABLET BY MOUTH DAILY  BEFORE BREAKFAST, Disp: 30 tablet, Rfl: 11   losartan  (COZAAR ) 50 MG tablet, Take 1 tablet (50 mg total) by mouth daily., Disp: 90 tablet, Rfl: 3   metFORMIN  (GLUCOPHAGE -XR) 500 MG 24 hr tablet, TAKE 1 TABLET BY MOUTH DAILY  WITH BREAKFAST, Disp: 30 tablet, Rfl: 11   montelukast  (SINGULAIR ) 10 MG tablet, TAKE 1 TABLET BY MOUTH AT BEDTIME, Disp: 90 tablet, Rfl: 0   nystatin  cream (MYCOSTATIN ), Apply 1 Application topically daily as needed (rash under breast stomach)., Disp: , Rfl:    ondansetron  (ZOFRAN ) 4 MG tablet, Take 1 tablet (4 mg total) by mouth every 8 (eight) hours as needed for nausea or vomiting., Disp: 20 tablet, Rfl: 0   revefenacin  (YUPELRI ) 175 MCG/3ML nebulizer solution, Take 3 mLs (175 mcg total) by nebulization daily., Disp: 270 mL, Rfl: 3   Semaglutide ,0.25 or 0.5MG /DOS, 2 MG/3ML SOPN, Inject 0.25 mg into the skin once a week., Disp: 3 mL, Rfl: 6   Semaglutide ,0.25 or 0.5MG /DOS, 2 MG/3ML SOPN, Inject 0.25 mg into the skin once a week., Disp: 3 mL, Rfl: 0   sodium chloride  HYPERTONIC 3 % nebulizer solution, Take by nebulization 3 (three) times daily. (Patient taking differently: Take 4 mLs by nebulization 3 (three) times daily.), Disp: 750 mL, Rfl: 5   rosuvastatin  (CRESTOR ) 40 MG tablet, TAKE 1 TABLET BY MOUTH DAILY, Disp: 100 tablet, Rfl: 2

## 2024-01-04 ENCOUNTER — Telehealth (HOSPITAL_BASED_OUTPATIENT_CLINIC_OR_DEPARTMENT_OTHER): Payer: Self-pay

## 2024-01-04 ENCOUNTER — Other Ambulatory Visit: Payer: Self-pay | Admitting: Family Medicine

## 2024-01-04 DIAGNOSIS — E109 Type 1 diabetes mellitus without complications: Secondary | ICD-10-CM

## 2024-01-04 NOTE — Telephone Encounter (Signed)
 [facility] number [phone] ext [phone] ask for deanna  This number is for any questions or concerns

## 2024-01-04 NOTE — Telephone Encounter (Signed)
 [facility] is calling to make sure fax was received . We need to have this fax addressed . They either need to deny it or its part at bottom either put non acceptable and put patient need follow up appointment . The fax that was sent over is need to be addressed and sent back. If able to get in before surgery which 7/25 first surgery  then second surgery 8/8  Fax number [phone]

## 2024-01-04 NOTE — Telephone Encounter (Signed)
 Warren, this was routed to me and just says surgical clearance form  I do not have any form on this pt  Please let me know what needs to be done

## 2024-01-04 NOTE — Telephone Encounter (Signed)
 See 12/29/23 telephone encounter.   This is a duplicate, will close.

## 2024-01-04 NOTE — Telephone Encounter (Signed)
 Risk assessment done by Dr. Kara 12/30/23- copy of his note faxed to [facility] at the number provided below.

## 2024-01-05 ENCOUNTER — Encounter: Payer: Self-pay | Admitting: Pulmonary Disease

## 2024-01-08 DIAGNOSIS — G4733 Obstructive sleep apnea (adult) (pediatric): Secondary | ICD-10-CM | POA: Diagnosis not present

## 2024-01-08 DIAGNOSIS — H25811 Combined forms of age-related cataract, right eye: Secondary | ICD-10-CM | POA: Diagnosis not present

## 2024-01-08 DIAGNOSIS — E1136 Type 2 diabetes mellitus with diabetic cataract: Secondary | ICD-10-CM | POA: Diagnosis not present

## 2024-01-08 DIAGNOSIS — H268 Other specified cataract: Secondary | ICD-10-CM | POA: Diagnosis not present

## 2024-01-14 ENCOUNTER — Telehealth: Payer: Self-pay

## 2024-01-14 NOTE — Telephone Encounter (Signed)
 Fax in your box regarding Directions for Ozempic  Pen. Nelson Land, CMA

## 2024-01-15 ENCOUNTER — Telehealth: Payer: Self-pay | Admitting: Pharmacist

## 2024-01-15 NOTE — Telephone Encounter (Signed)
 Patient contacts office to report that her pharmacy told her that she would not be able to fill her Ozempic  (semaglutide ) until October.  Patient is currently in short supply of her Ozempic  (semaglutide ). We discussed current dosing, glucose control and potential for drug therapy adjustment including dose increase of Ozempic  (semaglutide ).   Contacted [facility] and they verified that they have not filled Ozempic  (semaglutide ) and are having a message of too soon to fill until October.    Contacted [facility] and they have attempted to fill the prescription recently but have it on HOLD due to the need for a dose clarification.   After 14.5 minutes and multiple transfers I was able to clarify dose at 0.5mg  once weekly.  I also provided verbal for 3 month supply with 1 additional refill.   They shared that 3-5 business days for delivery was anticipated.   Contacted patient and shared news that a 3 month supply of the planned higher dose of 0.5mg  weekly was being dispensed.  We agreed to continue same diabetes regimen until after planned eye surgery on 8/8 I schedule with patient on Monday 8/11 for diabetes medication reevaluation and management.  At that time, anticipate lowering dose of insulin  while increasing Ozempic  (semaglutide ) to 0.5mg  weekly.  Patient thanked me for the clarification and resolution.   Total time with patient call and documentation of interaction: 39 minutes.

## 2024-01-15 NOTE — Telephone Encounter (Signed)
 Reviewed and agree with Dr Macky Lower plan.

## 2024-01-20 ENCOUNTER — Ambulatory Visit (HOSPITAL_BASED_OUTPATIENT_CLINIC_OR_DEPARTMENT_OTHER)
Admission: RE | Admit: 2024-01-20 | Discharge: 2024-01-20 | Disposition: A | Discharge status: Home / Self Care | Source: Ambulatory Visit | Attending: Family Medicine | Admitting: Family Medicine

## 2024-01-20 DIAGNOSIS — C321 Malignant neoplasm of supraglottis: Secondary | ICD-10-CM | POA: Diagnosis not present

## 2024-01-20 DIAGNOSIS — Z13820 Encounter for screening for osteoporosis: Secondary | ICD-10-CM | POA: Insufficient documentation

## 2024-01-20 DIAGNOSIS — Z0000 Encounter for general adult medical examination without abnormal findings: Secondary | ICD-10-CM | POA: Diagnosis present

## 2024-01-20 DIAGNOSIS — Z780 Asymptomatic menopausal state: Secondary | ICD-10-CM | POA: Insufficient documentation

## 2024-01-20 DIAGNOSIS — Z96652 Presence of left artificial knee joint: Secondary | ICD-10-CM | POA: Diagnosis not present

## 2024-01-20 DIAGNOSIS — M1712 Unilateral primary osteoarthritis, left knee: Secondary | ICD-10-CM | POA: Diagnosis not present

## 2024-01-20 DIAGNOSIS — C329 Malignant neoplasm of larynx, unspecified: Secondary | ICD-10-CM | POA: Diagnosis not present

## 2024-01-20 DIAGNOSIS — Z930 Tracheostomy status: Secondary | ICD-10-CM | POA: Diagnosis not present

## 2024-01-22 ENCOUNTER — Ambulatory Visit: Payer: Self-pay | Admitting: Family Medicine

## 2024-01-22 DIAGNOSIS — I10 Essential (primary) hypertension: Secondary | ICD-10-CM | POA: Diagnosis not present

## 2024-01-22 DIAGNOSIS — H25812 Combined forms of age-related cataract, left eye: Secondary | ICD-10-CM | POA: Diagnosis not present

## 2024-01-22 DIAGNOSIS — E039 Hypothyroidism, unspecified: Secondary | ICD-10-CM | POA: Diagnosis not present

## 2024-01-22 DIAGNOSIS — E1136 Type 2 diabetes mellitus with diabetic cataract: Secondary | ICD-10-CM | POA: Diagnosis not present

## 2024-01-25 ENCOUNTER — Encounter: Payer: Self-pay | Admitting: Pharmacist

## 2024-01-25 ENCOUNTER — Ambulatory Visit (INDEPENDENT_AMBULATORY_CARE_PROVIDER_SITE_OTHER): Admitting: Pharmacist

## 2024-01-25 VITALS — BP 97/64 | HR 91 | Wt 238.2 lb

## 2024-01-25 DIAGNOSIS — E785 Hyperlipidemia, unspecified: Secondary | ICD-10-CM | POA: Diagnosis not present

## 2024-01-25 DIAGNOSIS — I152 Hypertension secondary to endocrine disorders: Secondary | ICD-10-CM | POA: Diagnosis not present

## 2024-01-25 DIAGNOSIS — E1159 Type 2 diabetes mellitus with other circulatory complications: Secondary | ICD-10-CM | POA: Diagnosis not present

## 2024-01-25 DIAGNOSIS — E109 Type 1 diabetes mellitus without complications: Secondary | ICD-10-CM | POA: Diagnosis not present

## 2024-01-25 DIAGNOSIS — E1169 Type 2 diabetes mellitus with other specified complication: Secondary | ICD-10-CM | POA: Diagnosis not present

## 2024-01-25 LAB — POCT GLYCOSYLATED HEMOGLOBIN (HGB A1C): HbA1c, POC (controlled diabetic range): 6.5 % (ref 0.0–7.0)

## 2024-01-25 MED ORDER — INSULIN GLARGINE 100 UNIT/ML SOLOSTAR PEN: 25.0000 [IU] | PEN_INJECTOR | Freq: Every day | SUBCUTANEOUS | Status: DC

## 2024-01-25 MED ORDER — SEMAGLUTIDE(0.25 OR 0.5MG/DOS) 2 MG/3ML ~~LOC~~ SOPN: 0.5000 mg | PEN_INJECTOR | SUBCUTANEOUS | Status: DC

## 2024-01-25 NOTE — Assessment & Plan Note (Signed)
 ASCVD risk - primary prevention in patient with diabetes. Last LDL is 121 on 03/16/23 not at goal of <70 mg/dL. ASCVD risk factors include HF, DM, hypertension, Hyperlipidemia, and previous smoking history (not currently) and 10-year ASCVD risk score of 13.2%. High intensity statin indicated.  -Patient is on rosuvastatin 40 mg daily, tolerating well.  -Plan to reevaluate lipid panel at next visit, consider adding nonstatin therapy.

## 2024-01-25 NOTE — Assessment & Plan Note (Signed)
 Diabetes longstanding since 2012 currently controlled A1c 6.5% with medication and diet. GMI currently <6.  Patient is able to verbalize appropriate hypoglycemia management plan. Medication adherence appears good, although .  -Continue Ozempic  (semaglutide ) 0.5 mg weekly. -Decreased dose of basal insulin  Lantus  (insulin  glargine) from 30 units to 25 units daily in the morning.  -Decreased dose of rapid insulin  Humalog  (insulin  lispro) from 12 units to 10 units TID.  -Patient advised to contact me for glucose values less than 70. -Continue all other medications the same. -Patient educated on purpose, proper use, and potential adverse effects. -Extensively discussed pathophysiology of diabetes, recommended lifestyle interventions, dietary effects on blood sugar control.  -Counseled on s/sx of and management of hypoglycemia.

## 2024-01-25 NOTE — Assessment & Plan Note (Signed)
 Hypertension longstanding currently controlled. Blood Pressure slightly low today at 97/64 mmHg. Blood pressure goal of <130/80 mmHg. Medication adherence reported good. Decreased from 3 to 2 antihypertensives at last visit. Denies any hypotensive symptoms.  -Plan to reevaluate at next visit, no change today.

## 2024-01-25 NOTE — Assessment & Plan Note (Signed)
>>  ASSESSMENT AND PLAN FOR KETOSIS-PRONE DIABETES MELLITUS ([facility]CC) WRITTEN ON 01/25/2024  1:00 PM BY Shari Natt G, RPH-CPP  Diabetes longstanding since 2012 currently controlled A1c 6.5% with medication and diet. GMI currently <6.  Patient is able to verbalize appropriate hypoglycemia management plan. Medication adherence appears good, although .  -Continue Ozempic  (semaglutide ) 0.5 mg weekly. -Decreased dose of basal insulin  Lantus  (insulin  glargine) from 30 units to 25 units daily in the morning.  -Decreased dose of rapid insulin  Humalog  (insulin  lispro) from 12 units to 10 units TID.  -Patient advised to contact me for glucose values less than 70. -Continue all other medications the same. -Patient educated on purpose, proper use, and potential adverse effects. -Extensively discussed pathophysiology of diabetes, recommended lifestyle interventions, dietary effects on blood sugar control.  -Counseled on s/sx of and management of hypoglycemia.

## 2024-01-25 NOTE — Patient Instructions (Signed)
 It was nice to see you today! Keep up the great work, your sugars look great!  Your goal blood sugar is 80-130 before eating and less than 180 after eating.  Medication Changes: -Continue Ozempic  (semaglutide ) 0.5 mg weekly on Wednesdays, -Decrease Lantus  (insulin  glargine) to 25 units daily.  -Decrease Humalog  (insuline lispro) to 10 units three times a day.  -Please contact me if you are having sugars less than 70.   -Continue all other medication the same.   Monitor blood sugars at home and keep a log (glucometer or piece of paper) to bring with you to your next visit.  Keep up the good work with diet and exercise. Aim for a diet full of vegetables, fruit and lean meats (chicken, [place], fish). Try to limit salt intake by eating fresh or frozen vegetables (instead of canned), rinse canned vegetables prior to cooking and do not add any additional salt to meals.

## 2024-01-25 NOTE — Progress Notes (Signed)
 S:     Chief Complaint  Patient presents with   Medication Management    DM/CGM f/u   68 y.o. female who presents for diabetes evaluation, education, and management. Patient arrives in good spirits and presents without any assistance.   Patient was referred and last seen by Primary Care Provider, Dr. Cleotilde, on 10/22/23. Last seen by me on 11/17/23. At last visit with me, Ozempic  (semaglutide ) 0.25 mg was started, since increased to 0.5 mg weekly on 01/15/24. Lantus  (insulin  glargine) was decreased to 26 units daily and Humalog  (insuline lispro) was decreased to 10 units TID. Patient reports taking Lantus  (insulin  glargine) 30 units daily and Humalog  (insuline lispro) 12 units TID. Losartan /hydrochlorothiazide  50/12.5 mg daily was changed to losartan  50 mg daily as patient was experiencing hypotension.   PMH is significant for DM, hypertension, Hyperlipidemia, HF, COPD.  Patient reports Diabetes was diagnosed in 2012.   Current diabetes medications include: Metformin  XR 500 mg once daily,  Lantus  (insulin  glargine) 30 units daily, Humalog  (insulin  lispro) 12 units TID, Ozempic  (semaglutide ) 0.5 mg weekly on Wednesdays (increased this week, reports no GI upset)  Current hypertension medications include: losartan  50/12.5 mg daily, carvedilol  12.5 mg BID  Current hyperlipidemia medications include: rosuvastatin  40 mg daily  Patient reports adherence to taking all medications as prescribed.   Patient reports goal weight of 185 lbs by the end of the year.  Do you feel that your medications are working for you? yes Have you been experiencing any side effects to the medications prescribed? no Do you have any problems obtaining medications due to transportation or finances? no Insurance coverage: [employer], [place]  Patient reports hypoglycemic events in the mornings but denies symptoms.   O:   Review of Systems  All other systems reviewed and are negative.   Physical  Exam Constitutional:      Appearance: Normal appearance.  Neurological:     Mental Status: She is alert.     Libre3 CGM Download today 01/25/24 % Time CGM is active: 90.1% Average Glucose: 108 mg/dL Glucose Management Indicator: 5.9  Glucose Variability: 26.2% (goal <36%) Time in Goal:  - Time in range 70-180: 93% - Time above range: 3% - Time below range: 4% Observed patterns:   Lab Results  Component Value Date   HGBA1C 6.5 01/25/2024   Vitals:   01/25/24 0839  BP: 97/64  Pulse: 91  SpO2: (!) 86%    Lipid Panel     Component Value Date/Time   CHOL 225 (H) 03/16/2023 1621   TRIG 267 (H) 03/16/2023 1621   HDL 57 03/16/2023 1621   CHOLHDL 3.9 03/16/2023 1621   CHOLHDL 2.9 03/20/2011 0840   VLDL 15 03/20/2011 0840   LDLCALC 121 (H) 03/16/2023 1621    Clinical Atherosclerotic Cardiovascular Disease (ASCVD): No history of ASCVD event, patient has Chronic diastolic (congestive) heart failure diagnosis. The 10-year ASCVD risk score (Arnett DK, et al., 2019) is: 13.2%   Values used to calculate the score:     Age: 56 years     Clincally relevant sex: Female     Is Non-Hispanic African American: Yes     Diabetic: Yes     Tobacco smoker: No     Systolic Blood Pressure: 97 mmHg     Is BP treated: Yes     HDL Cholesterol: 57 mg/dL     Total Cholesterol: 225 mg/dL   Patient is participating in a Managed Medicaid Plan:  Yes  A/P: Diabetes longstanding since 2012 currently controlled A1c 6.5% with medication and diet. GMI currently <6.  Patient is able to verbalize appropriate hypoglycemia management plan. Medication adherence appears good, although .  -Continue Ozempic  (semaglutide ) 0.5 mg weekly. -Decreased dose of basal insulin  Lantus  (insulin  glargine) from 30 units to 25 units daily in the morning.  -Decreased dose of rapid insulin  Humalog  (insulin  lispro) from 12 units to 10 units TID.  -Patient advised to contact me for glucose values less than 70. -Continue  all other medications the same. -Patient educated on purpose, proper use, and potential adverse effects. -Extensively discussed pathophysiology of diabetes, recommended lifestyle interventions, dietary effects on blood sugar control.  -Counseled on s/sx of and management of hypoglycemia.   ASCVD risk - primary prevention in patient with diabetes. Last LDL is 121 on 03/16/23 not at goal of <70 mg/dL. ASCVD risk factors include HF, DM, hypertension, Hyperlipidemia, and previous smoking history (not currently) and 10-year ASCVD risk score of 13.2%. High intensity statin indicated.  -Patient is on rosuvastatin  40 mg daily, tolerating well.  -Plan to reevaluate lipid panel at next visit, consider adding nonstatin therapy.  Hypertension longstanding currently controlled. Blood Pressure slightly low today at 97/64 mmHg. Blood pressure goal of <130/80 mmHg. Medication adherence reported good. Decreased from 3 to 2 antihypertensives at last visit. Denies any hypotensive symptoms.  -Plan to reevaluate at next visit, no change today.   Written patient instructions provided. Patient verbalized understanding of treatment plan.  Total time in face to face counseling 23 minutes.    Follow-up:  Pharmacist TBD PCP clinic visit in 03/07/24 Patient seen with Fonda Blase, PharmD Candidate - [employer] student and Calton Nash, PharmD Candidate - [employer] student.

## 2024-01-26 NOTE — Progress Notes (Signed)
 Reviewed and agree with Dr Rennis plan.

## 2024-02-01 ENCOUNTER — Telehealth: Payer: Self-pay | Admitting: Family Medicine

## 2024-02-01 DIAGNOSIS — R0982 Postnasal drip: Secondary | ICD-10-CM

## 2024-02-01 DIAGNOSIS — R1032 Left lower quadrant pain: Secondary | ICD-10-CM

## 2024-02-01 MED ORDER — ONDANSETRON HCL 4 MG PO TABS
4.0000 mg | ORAL_TABLET | Freq: Three times a day (TID) | ORAL | 0 refills | Status: DC | PRN
Start: 2024-02-01 — End: 2024-03-29

## 2024-02-01 MED ORDER — FLUTICASONE PROPIONATE 50 MCG/ACT NA SUSP
1.0000 | Freq: Two times a day (BID) | NASAL | 0 refills | Status: DC
Start: 2024-02-01 — End: 2024-03-29

## 2024-02-01 NOTE — Telephone Encounter (Signed)
 Patient walked in requesting her medication be sent to her new Pharmacy.

## 2024-02-01 NOTE — Telephone Encounter (Signed)
 Patient walked in requesting 3 refills be sent to [facility]. Zofran , and both nasal sprays please.

## 2024-02-02 DIAGNOSIS — Z8521 Personal history of malignant neoplasm of larynx: Secondary | ICD-10-CM | POA: Diagnosis not present

## 2024-02-08 ENCOUNTER — Other Ambulatory Visit: Payer: Self-pay | Admitting: Family Medicine

## 2024-02-08 DIAGNOSIS — E109 Type 1 diabetes mellitus without complications: Secondary | ICD-10-CM

## 2024-02-20 DIAGNOSIS — C321 Malignant neoplasm of supraglottis: Secondary | ICD-10-CM | POA: Diagnosis not present

## 2024-02-20 DIAGNOSIS — Z96652 Presence of left artificial knee joint: Secondary | ICD-10-CM | POA: Diagnosis not present

## 2024-02-20 DIAGNOSIS — C329 Malignant neoplasm of larynx, unspecified: Secondary | ICD-10-CM | POA: Diagnosis not present

## 2024-02-20 DIAGNOSIS — M1712 Unilateral primary osteoarthritis, left knee: Secondary | ICD-10-CM | POA: Diagnosis not present

## 2024-02-20 DIAGNOSIS — Z930 Tracheostomy status: Secondary | ICD-10-CM | POA: Diagnosis not present

## 2024-02-21 ENCOUNTER — Ambulatory Visit (HOSPITAL_COMMUNITY)
Admission: EM | Admit: 2024-02-21 | Discharge: 2024-02-21 | Disposition: A | Discharge status: Home / Self Care | Attending: Physician Assistant | Admitting: Physician Assistant

## 2024-02-21 ENCOUNTER — Encounter (HOSPITAL_COMMUNITY): Payer: Self-pay | Admitting: Emergency Medicine

## 2024-02-21 ENCOUNTER — Ambulatory Visit (INDEPENDENT_AMBULATORY_CARE_PROVIDER_SITE_OTHER)

## 2024-02-21 ENCOUNTER — Emergency Department (HOSPITAL_COMMUNITY)
Admission: EM | Admit: 2024-02-21 | Discharge: 2024-02-21 | Disposition: A | Discharge status: Home / Self Care | Source: Home / Self Care | Attending: Emergency Medicine | Admitting: Emergency Medicine

## 2024-02-21 ENCOUNTER — Other Ambulatory Visit: Payer: Self-pay

## 2024-02-21 DIAGNOSIS — Z79890 Hormone replacement therapy (postmenopausal): Secondary | ICD-10-CM | POA: Insufficient documentation

## 2024-02-21 DIAGNOSIS — I130 Hypertensive heart and chronic kidney disease with heart failure and stage 1 through stage 4 chronic kidney disease, or unspecified chronic kidney disease: Secondary | ICD-10-CM | POA: Diagnosis not present

## 2024-02-21 DIAGNOSIS — J9601 Acute respiratory failure with hypoxia: Secondary | ICD-10-CM | POA: Diagnosis not present

## 2024-02-21 DIAGNOSIS — R918 Other nonspecific abnormal finding of lung field: Secondary | ICD-10-CM | POA: Diagnosis not present

## 2024-02-21 DIAGNOSIS — J9811 Atelectasis: Secondary | ICD-10-CM | POA: Diagnosis not present

## 2024-02-21 DIAGNOSIS — U071 COVID-19: Secondary | ICD-10-CM | POA: Insufficient documentation

## 2024-02-21 DIAGNOSIS — Z7985 Long-term (current) use of injectable non-insulin antidiabetic drugs: Secondary | ICD-10-CM | POA: Diagnosis not present

## 2024-02-21 DIAGNOSIS — R051 Acute cough: Secondary | ICD-10-CM

## 2024-02-21 DIAGNOSIS — I5032 Chronic diastolic (congestive) heart failure: Secondary | ICD-10-CM | POA: Diagnosis not present

## 2024-02-21 DIAGNOSIS — E039 Hypothyroidism, unspecified: Secondary | ICD-10-CM | POA: Insufficient documentation

## 2024-02-21 DIAGNOSIS — E871 Hypo-osmolality and hyponatremia: Secondary | ICD-10-CM | POA: Diagnosis not present

## 2024-02-21 DIAGNOSIS — E875 Hyperkalemia: Secondary | ICD-10-CM | POA: Diagnosis not present

## 2024-02-21 DIAGNOSIS — J449 Chronic obstructive pulmonary disease, unspecified: Secondary | ICD-10-CM | POA: Insufficient documentation

## 2024-02-21 DIAGNOSIS — E878 Other disorders of electrolyte and fluid balance, not elsewhere classified: Secondary | ICD-10-CM | POA: Diagnosis not present

## 2024-02-21 DIAGNOSIS — Z7951 Long term (current) use of inhaled steroids: Secondary | ICD-10-CM | POA: Diagnosis not present

## 2024-02-21 DIAGNOSIS — Z79899 Other long term (current) drug therapy: Secondary | ICD-10-CM | POA: Insufficient documentation

## 2024-02-21 DIAGNOSIS — Z794 Long term (current) use of insulin: Secondary | ICD-10-CM | POA: Diagnosis not present

## 2024-02-21 DIAGNOSIS — Z7984 Long term (current) use of oral hypoglycemic drugs: Secondary | ICD-10-CM | POA: Diagnosis not present

## 2024-02-21 DIAGNOSIS — R0602 Shortness of breath: Secondary | ICD-10-CM | POA: Diagnosis not present

## 2024-02-21 DIAGNOSIS — E1122 Type 2 diabetes mellitus with diabetic chronic kidney disease: Secondary | ICD-10-CM | POA: Diagnosis not present

## 2024-02-21 DIAGNOSIS — J440 Chronic obstructive pulmonary disease with acute lower respiratory infection: Secondary | ICD-10-CM | POA: Diagnosis not present

## 2024-02-21 DIAGNOSIS — R0902 Hypoxemia: Secondary | ICD-10-CM | POA: Insufficient documentation

## 2024-02-21 DIAGNOSIS — Z930 Tracheostomy status: Secondary | ICD-10-CM | POA: Diagnosis not present

## 2024-02-21 DIAGNOSIS — E119 Type 2 diabetes mellitus without complications: Secondary | ICD-10-CM | POA: Insufficient documentation

## 2024-02-21 DIAGNOSIS — R519 Headache, unspecified: Secondary | ICD-10-CM | POA: Diagnosis not present

## 2024-02-21 DIAGNOSIS — N1831 Chronic kidney disease, stage 3a: Secondary | ICD-10-CM | POA: Diagnosis not present

## 2024-02-21 DIAGNOSIS — J441 Chronic obstructive pulmonary disease with (acute) exacerbation: Secondary | ICD-10-CM | POA: Diagnosis not present

## 2024-02-21 DIAGNOSIS — E785 Hyperlipidemia, unspecified: Secondary | ICD-10-CM | POA: Diagnosis not present

## 2024-02-21 DIAGNOSIS — E1165 Type 2 diabetes mellitus with hyperglycemia: Secondary | ICD-10-CM | POA: Diagnosis not present

## 2024-02-21 DIAGNOSIS — I10 Essential (primary) hypertension: Secondary | ICD-10-CM | POA: Insufficient documentation

## 2024-02-21 DIAGNOSIS — R0989 Other specified symptoms and signs involving the circulatory and respiratory systems: Secondary | ICD-10-CM | POA: Diagnosis not present

## 2024-02-21 LAB — POC COVID19/FLU A&B COMBO
Covid Antigen, POC: POSITIVE — AB
Influenza A Antigen, POC: NEGATIVE
Influenza B Antigen, POC: NEGATIVE

## 2024-02-21 LAB — CBC WITH DIFFERENTIAL/PLATELET
Abs Immature Granulocytes: 0.02 K/uL (ref 0.00–0.07)
Basophils Absolute: 0.1 K/uL (ref 0.0–0.1)
Basophils Relative: 1 %
Eosinophils Absolute: 0.1 K/uL (ref 0.0–0.5)
Eosinophils Relative: 2 %
HCT: 39 % (ref 36.0–46.0)
Hemoglobin: 12.5 g/dL (ref 12.0–15.0)
Immature Granulocytes: 0 %
Lymphocytes Relative: 18 %
Lymphs Abs: 1.1 K/uL (ref 0.7–4.0)
MCH: 29.8 pg (ref 26.0–34.0)
MCHC: 32.1 g/dL (ref 30.0–36.0)
MCV: 92.9 fL (ref 80.0–100.0)
Monocytes Absolute: 0.7 K/uL (ref 0.1–1.0)
Monocytes Relative: 11 %
Neutro Abs: 4.2 K/uL (ref 1.7–7.7)
Neutrophils Relative %: 68 %
Platelets: 278 K/uL (ref 150–400)
RBC: 4.2 MIL/uL (ref 3.87–5.11)
RDW: 13.8 % (ref 11.5–15.5)
WBC: 6.2 K/uL (ref 4.0–10.5)
nRBC: 0 % (ref 0.0–0.2)

## 2024-02-21 LAB — I-STAT CHEM 8, ED
BUN: 11 mg/dL (ref 8–23)
Calcium, Ion: 1.09 mmol/L — ABNORMAL LOW (ref 1.15–1.40)
Chloride: 102 mmol/L (ref 98–111)
Creatinine, Ser: 1 mg/dL (ref 0.44–1.00)
Glucose, Bld: 73 mg/dL (ref 70–99)
HCT: 39 % (ref 36.0–46.0)
Hemoglobin: 13.3 g/dL (ref 12.0–15.0)
Potassium: 4.1 mmol/L (ref 3.5–5.1)
Sodium: 140 mmol/L (ref 135–145)
TCO2: 28 mmol/L (ref 22–32)

## 2024-02-21 MED ORDER — PREDNISONE 20 MG PO TABS: 40.0000 mg | ORAL_TABLET | Freq: Every day | ORAL | 0 refills | Status: DC

## 2024-02-21 MED ORDER — PREDNISONE 20 MG PO TABS
60.0000 mg | ORAL_TABLET | Freq: Once | ORAL | Status: AC
  Administered 2024-02-21: 60 mg via ORAL
  Filled 2024-02-21: qty 3

## 2024-02-21 NOTE — ED Triage Notes (Signed)
 Complains of headache, body aches, sob.  Symptoms started yesterday.  Patient has taken mucinex  dm.  Patient has checked for fever-denies having a fever.  Patient has increased cough  Husband has had a cold.  Reports she and her husband has tested for covid and both were negative

## 2024-02-21 NOTE — ED Notes (Signed)
 [facility] was called for patient transport.  Ed was called before patient arrival but no answer.  Pts vitals were 167/119 BP 97 HR 26, 02 95% and temp 98.0. Pt is alert. Pt is currently on 2 liters of oxygen .

## 2024-02-21 NOTE — Discharge Instructions (Addendum)
 The x-ray today did not show any signs of pneumonia but your oxygen  is low.  However you still wanted to go home but if you start having more shortness of breath, any confusion or trouble with being very drowsy return to the emergency room and we are happy to see you.

## 2024-02-21 NOTE — ED Notes (Signed)
 Ambulated pt. From pt. Room around blue-orange and the back hall of yellow, back to her room, her O2 went down to 77 and took three minutes to get back to 90.

## 2024-02-21 NOTE — ED Provider Notes (Signed)
 [facility]    CSN: [id number] Arrival date & time: 02/21/24  1039      History   Chief Complaint No chief complaint on file.   HPI Deborah [name] is a 68 y.o. female.   Patient presents today with 3-day history of URI symptoms.  She reports rhinorrhea, congestion, increasing cough.  Denies any fever, chest pain, nausea, vomiting.  She reports that her husband had similar symptoms but he has since tested negative for COVID.  She is concerned because she has a history of chronic pulmonary disease (COPD and asthma) and often requires antibiotics whenever she gets sick.  She is followed by pulmonology and was last seen by them on 12/30/2023 at which point she was doing well and encouraged to continue her Pulmicort , formoterol , revefenacin  nebulizer solutions.  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  She denies any recent antibiotics or steroids.  She denies any recent hospitalizations but has been seen in the emergency room as recently as 09/29/2023.  She is up-to-date on influenza, pneumococcal, COVID-19 vaccinations.    Past Medical History:  Diagnosis Date   Anemia    h/o of   Anxiety    Arthritis    in knees   Asthma    Breast cancer (HCC)    Bronchitis    Carpal tunnel syndrome 03/16/2018   Chronic pain 01/20/2012   Patient with chronic pain at stoma site. Seen by ENT (Dr. Jesus) who recommends pain management. 12/22/11. She is currently on Vicodin and fentanyl .   04/07/13: denies stoma pain. Currently on flexeril , mobic  and tramadol  for b/l shoulder and neck pains .   COPD (chronic obstructive pulmonary disease) (HCC)    Depression    Diabetes mellitus without complication (HCC)    Diverticulosis 11/15/2012   noted on screening colonoscopy    DOE (dyspnea on exertion) 04/28/2014   Esophageal stricture    FB bronchus    Former smoker 03/19/2011   GERD (gastroesophageal reflux disease)    Heart murmur    asymptomatic    History of laryngectomy     History of radiation therapy 11/15/18- 12/06/18   Right Breast total dose 42.56 Gy in 16 fractions.    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   Leukocytopenia    Nausea alone 07/28/2013   Neck pain 01/21/2012   Normal MRI 07/14/2011   negative for mestasis    Plantar fasciitis 06/06/2016   Pneumonia 2012   RUQ abdominal pain 08/08/2022   On exam patient with marked tenderness to percussion and palpation RUQ. Obesity hinder palpation  PLan   Sciatica    Seizures (HCC) 06/29/2011   07/24/11 off Effexor w/o seizure   Sepsis (HCC) 08/04/2012   Sinusitis, chronic 07/20/2011   Bilateral maxillary, identified on MRI of head 07/14/11.     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   Major depressive disorder 04/08/2018   Obesity 08/24/2017   Osteoarthritis of knees, bilateral 01/04/2015   Dysphagia 08/16/2014   Chronic diastolic (congestive) heart failure (HCC) 04/28/2014   Status post trachelectomy 08/04/2012   History of head and neck cancer 09/26/2011   Hx of radiation therapy    Hypothyroidism 03/19/2011   Hypertension associated with diabetes (HCC) 03/19/2011    Past Surgical History:  Procedure Laterality Date   BREAST  LUMPECTOMY Right 07/06/2018   BREAST LUMPECTOMY WITH RADIOACTIVE SEED LOCALIZATION Right 07/06/2018   Procedure: RIGHT BREAST LUMPECTOMY WITH RADIOACTIVE SEED LOCALIZATION;  Surgeon: Ebbie Cough, MD;  Location: [facility] OR;  Service: General;  Laterality: Right;   COLONOSCOPY N/A 11/15/2012   Procedure: COLONOSCOPY;  Surgeon: Princella CHRISTELLA Nida, MD;  Location: [facility];  Service: Endoscopy;  Laterality: N/A;   CRYOTHERAPY  05/11/2021   Procedure: CRYOTHERAPY;  Surgeon: Brenna Adine CROME, DO;  Location: [facility];  Service: Pulmonary;;   DENTAL RESTORATION/EXTRACTION WITH X-RAY     ESOPHAGEAL DILATION N/A 09/09/2019   Procedure: ESOPHAGEAL DILATION;  Surgeon: Jesus Oliphant, MD;  Location: [facility];  Service: ENT;  Laterality: N/A;   ESOPHAGEAL DILATION N/A 10/05/2019   Procedure: ESOPHAGEAL DILATION;  Surgeon: Jesus Oliphant, MD;  Location: [facility];  Service: ENT;  Laterality: N/A;  via tracheostomy   ESOPHAGEAL DILATION N/A 02/27/2020   Procedure: ESOPHAGEAL DILATION;  Surgeon: Jesus Oliphant, MD;  Location: [facility];  Service: ENT;  Laterality: N/A;   ESOPHAGOGASTRODUODENOSCOPY (EGD) WITH PROPOFOL  N/A 08/12/2018   Procedure: ESOPHAGOGASTRODUODENOSCOPY (EGD) WITH PROPOFOL ;  Surgeon: Legrand Victory CROME DOUGLAS, MD;  Location: [facility] END[facility]COPY;  Service: Gastroenterology;  Laterality: N/A;   ESOPHAGOSCOPY  06/21/2012   Procedure: ESOPHAGOSCOPY;  Surgeon: Oliphant Jesus, MD;  Location: [facility];  Service: ENT;  Laterality: N/A;   ESOPHAGOSCOPY N/A 06/24/2021   Procedure: ESOPHAGOSCOPY WITH DILATATION;  Surgeon: Jesus Oliphant, MD;  Location: [facility];  Service: ENT;  Laterality: N/A;   ESOPHAGOSCOPY N/A 02/14/2022   Procedure: ESOPHAGOSCOPY WITH DILATATION;  Surgeon: Jesus Oliphant, MD;  Location: [facility] OR;  Service: ENT;  Laterality: N/A;   ESOPHAGOSCOPY N/A 08/11/2022   Procedure: ESOPHAGOSCOPY WITH DILATATION;  Surgeon: Jesus Oliphant, MD;   Location: [facility] OR;  Service: ENT;  Laterality: N/A;   ESOPHAGOSCOPY WITH DILITATION N/A 09/21/2014   Procedure: ESOPHAGOSCOPY WITH DILITATION;  Surgeon: Oliphant Jesus, MD;  Location: [facility] OR;  Service: ENT;  Laterality: N/A;   ESOPHAGOSCOPY WITH DILITATION N/A 07/04/2016   Procedure: ESOPHAGOSCOPY WITH DILITATION;  Surgeon: Oliphant Jesus, MD;  Location: [facility] OR;  Service: ENT;  Laterality: N/A;   ESOPHAGOSCOPY WITH DILITATION N/A 12/01/2017   Procedure: ESOPHAGOSCOPY WITH DILITATION;  Surgeon: Jesus Oliphant, MD;  Location: [facility] OR;  Service: ENT;  Laterality: N/A;   ESOPHAGOSCOPY WITH DILITATION N/A 04/19/2018   Procedure: ESOPHAGOSCOPY WITH DILITATION;  Surgeon: Jesus Oliphant, MD;  Location: [facility] OR;  Service: ENT;  Laterality: N/A;   ESOPHAGOSCOPY WITH DILITATION N/A 03/07/2019   Procedure: Esophagoscopy with dilatation;  Surgeon: Jesus Oliphant, MD;  Location: [facility];  Service: ENT;  Laterality: N/A;   ESOPHAGOSCOPY WITH DILITATION N/A 07/16/2020   Procedure: ESOPHAGOSCOPY WITH DILATION;  Surgeon: Jesus Oliphant, MD;  Location: [facility];  Service: ENT;  Laterality: N/A;   ESOPHAGOSCOPY WITH DILITATION N/A 10/08/2020   Procedure: ESOPHAGOSCOPY  With Dilation;  Surgeon: Jesus Oliphant, MD;  Location: [facility];  Service: ENT;  Laterality: N/A;  21 French to 28 french   ESOPHAGOSCOPY WITH DILITATION N/A 08/05/2023   Procedure: ESOPHAGOSCOPY WITH DILITATION;  Surgeon: Jesus Oliphant, MD;  Location: [facility] OR;  Service: ENT;  Laterality: N/A;   FLEXIBLE BRONCHOSCOPY  01/08/2018       FOREIGN BODY REMOVAL  05/11/2021   Procedure: FOREIGN BODY REMOVAL;  Surgeon: Brenna Adine CROME, DO;  [facility];  Service: Pulmonary;;   FOREIGN BODY REMOVAL BRONCHIAL  10/02/2011   Procedure: REMOVAL FOREIGN BODY BRONCHIAL;  Surgeon: Merilee Kraft, MD;  Location: [facility] OR;  Service: ENT;  Laterality: N/A;   FOREIGN BODY REMOVAL BRONCHIAL N/A 01/08/2018   Procedure: REMOVAL FOREIGN BODY  BRONCHIAL;  Surgeon: Arlana Arnt, MD;  Location: [facility];  Service: ENT;  Laterality: N/A;   HEMOSTASIS CONTROL  05/11/2021   Procedure: HEMOSTASIS CONTROL;  Surgeon: Brenna Adine CROME, DO;  [facility];  Service: Pulmonary;;   LARYNGECTOMY     Porta cath removal     PORTACATH PLACEMENT  09/17/10   Tip in cavoatrial junction   RE-EXCISION OF BREAST CANCER,SUPERIOR MARGINS Right 07/29/2018   Procedure: RE-EXCISION OF RIGHT BREAST MEDIAL MARGINS;  Surgeon: Ebbie Cough, MD;  Location[facility] OR;  Service: General;  Laterality: Right;   RIGID ESOPHAGOSCOPY N/A 03/19/2019   Procedure: FLEXIBLE ESOPHAGOSCOPY;  Surgeon: Arlana Arnt, MD;  Location: [facility] OR;  Service: ENT;  Laterality: N/A;   STOMAPLASTY N/A 10/21/2016   Procedure: LINNA;  Surgeon: Jesus Oliphant, MD;  Location: [facility] OR;  Service: ENT;  Laterality: N/A;   TOTAL KNEE ARTHROPLASTY Right 12/12/2019   Procedure: RIGHT TOTAL KNEE ARTHROPLASTY;  Surgeon: Jerri Kay HERO, MD;  [facility];  Service: Orthopedics;  Laterality: Right;   TOTAL KNEE ARTHROPLASTY Left 04/01/2021   Procedure: LEFT TOTAL KNEE ARTHROPLASTY;  Surgeon: Jerri Kay HERO, MD;  [facility];  Service: Orthopedics;  Laterality: Left;   TRACHEAL DILITATION  07/16/2011   Procedure: TRACHEAL DILITATION;  Surgeon: Oliphant VEAR Jesus, MD;  [facility];  Service: ENT;  Laterality: N/A;  dilation of tracheal stoma and replacement of stoma tube   TRACHEAL ESOPHAGEAL PROSTHESIS (TEP) CHANGE N/A 08/11/2022   Procedure: TRACHEAL ESOPHAGEAL PROSTHESIS (TEP) CHANGE;  Surgeon: Jesus Oliphant, MD;  Location: [facility] OR;  Service: ENT;  Laterality: N/A;  ATOS TEP SET   TUBAL LIGATION  1982   VIDEO BRONCHOSCOPY Bilateral 05/11/2021   Procedure: VIDEO BRONCHOSCOPY WITHOUT FLUORO;  Surgeon: Brenna Adine CROME, DO;  [facility];  Service: Pulmonary;  Laterality: Bilateral;  w/ cryotherapy, Foreign body extraction    OB History   No obstetric history on file.      Home  Medications    Prior to Admission medications   Medication Sig Start Date End Date Taking? Authorizing Provider  budesonide  (PULMICORT ) 0.5 MG/2ML nebulizer solution Take 2 mLs (0.5 mg total) by nebulization daily. 09/17/23   Cleotilde Lukes, DO  buPROPion  (WELLBUTRIN  XL) 150 MG 24 hr tablet TAKE 1 TABLET BY MOUTH DAILY 10/28/23   Miller, Samantha, DO  carvedilol  (COREG ) 12.5 MG tablet Take 1 tablet (12.5 mg total) by mouth 2 (two) times daily with a meal. 08/13/23   Cleotilde Lukes, DO  Continuous Glucose Receiver (DEXCOM G7 RECEIVER) DEVI Apply 1 each topically See admin instructions. 12/02/23   Cleotilde Lukes, DO  Continuous Glucose Sensor (DEXCOM G7 SENSOR) MISC APPLY NEW SENSOR EVERY 10 DAYS 11/12/23  Cleotilde Lukes, DO  EQ ALLERGY RELIEF, CETIRIZINE , 10 MG tablet Take 1 tablet by mouth once daily 09/11/23   Cleotilde Lukes, DO  escitalopram  (LEXAPRO ) 20 MG tablet Take 1 tablet (20 mg total) by mouth daily. 03/05/23   McDiarmid, Krystal BIRCH, MD  fluticasone  (FLONASE ) 50 MCG/ACT nasal spray Place 1 spray into both nostrils 2 (two) times daily. 02/01/24   Cleotilde Lukes, DO  formoterol  (PERFOROMIST ) 20 MCG/2ML nebulizer solution Take 2 mLs (20 mcg total) by nebulization 2 (two) times daily. 09/17/23   Cleotilde Lukes, DO  Humidifiers MISC 1 application by Tracheal Tube route as needed. 02/05/21   Austin Ade, MD  insulin  lispro (HUMALOG  KWIKPEN) 100 UNIT/ML KwikPen Inject 10 Units into the skin 3 (three) times daily. 10/23/23   McDiarmid, Krystal BIRCH, MD  Insulin  Pen Needle (BD PEN NEEDLE NANO 2ND GEN) 32G X 4 MM MISC Inject 1 each into the skin 4 (four) times daily. 10/26/23   Cleotilde Lukes, DO  LANTUS  SOLOSTAR 100 UNIT/ML Solostar Pen INJECT SUBCUTANEOUSLY 35 UNITS  DAILY 02/08/24   Cleotilde Lukes, DO  levothyroxine  (SYNTHROID ) 200 MCG tablet TAKE 1 TABLET BY MOUTH DAILY  BEFORE BREAKFAST 11/10/23   Cleotilde Lukes, DO  losartan  (COZAAR ) 50 MG tablet Take 1 tablet (50 mg total) by mouth daily. 11/17/23    McDiarmid, Krystal BIRCH, MD  metFORMIN  (GLUCOPHAGE -XR) 500 MG 24 hr tablet TAKE 1 TABLET BY MOUTH DAILY  WITH BREAKFAST 09/28/23   Miller, Samantha, DO  montelukast  (SINGULAIR ) 10 MG tablet TAKE 1 TABLET BY MOUTH AT BEDTIME 09/11/23   Miller, Samantha, DO  nystatin  cream (MYCOSTATIN ) Apply 1 Application topically daily as needed (rash under breast stomach).    [provider]  ondansetron  (ZOFRAN ) 4 MG tablet Take 1 tablet (4 mg total) by mouth every 8 (eight) hours as needed for nausea or vomiting. 02/01/24   Cleotilde Lukes, DO  revefenacin  (YUPELRI ) 175 MCG/3ML nebulizer solution Take 3 mLs (175 mcg total) by nebulization daily. 12/30/23   Kara Dorn NOVAK, MD  rosuvastatin  (CRESTOR ) 40 MG tablet TAKE 1 TABLET BY MOUTH DAILY 01/05/24   Cleotilde Lukes, DO  Semaglutide ,0.25 or 0.5MG /DOS, 2 MG/3ML SOPN Inject 0.5 mg into the skin once a week. 01/25/24   McDiarmid, Krystal BIRCH, MD  sodium chloride  HYPERTONIC 3 % nebulizer solution Take by nebulization 3 (three) times daily. 05/22/23   Kara Dorn NOVAK, MD    Family History Family History  Problem Relation Age of Onset   Heart disease Mother    Heart disease Father    Heart disease Sister    Cancer Sister        type unknown   Diabetes Sister    Cancer Brother        type unknown   Liver disease Maternal Grandmother    Breast cancer Neg Hx    Colon cancer Neg Hx    Esophageal cancer Neg Hx    Rectal cancer Neg Hx    Stomach cancer Neg Hx    Colon polyps Neg Hx     Social History Social History   Tobacco Use   Smoking status: Former    Current packs/day: 0.00    Average packs/day: 2.0 packs/day for 20.0 years (40.0 ttl pk-yrs)    Types: Cigarettes    Start date: 09/17/1990    Quit date: 09/17/2010    Years since quitting: 13.4   Smokeless tobacco: Never  Vaping Use   Vaping status: Never Used  Substance Use Topics   Alcohol  use: Yes    Comment: Socially drinks    Drug use: No    Comment: tried cocaine  1 time 2010 only used 1  time     Allergies   Lisinopril    Review of Systems Review of Systems  Constitutional:  Positive for activity change. Negative for appetite change, fatigue and fever.  HENT:  Positive for congestion and rhinorrhea. Negative for sinus pressure, sneezing and sore throat.   Respiratory:  Positive for cough, chest tightness and shortness of breath. Negative for wheezing.   Cardiovascular:  Negative for chest pain.  Gastrointestinal:  Negative for abdominal pain, diarrhea, nausea and vomiting.  Neurological:  Positive for headaches. Negative for dizziness and light-headedness.     Physical Exam Triage Vital Signs ED Triage Vitals  Encounter Vitals Group     BP 02/21/24 1049 (!) 169/89     Girls Systolic BP Percentile --      Girls Diastolic BP Percentile --      Boys Systolic BP Percentile --      Boys Diastolic BP Percentile --      Pulse Rate 02/21/24 1049 (!) 101     Resp 02/21/24 1049 (!) 26     Temp 02/21/24 1049 99.6 F (37.6 C)     Temp Source 02/21/24 1049 Oral     SpO2 02/21/24 1049 (!) 89 %     Weight --      Height --      Head Circumference --      Peak Flow --      Pain Score 02/21/24 1046 10     Pain Loc --      Pain Education --      Exclude from Growth Chart --    No data found.  Updated Vital Signs BP (!) 167/119 (BP Location: Right Arm)   Pulse 94   Temp 99.6 F (37.6 C) (Oral)   Resp 20   SpO2 (!) 86%   Visual Acuity Right Eye Distance:   Left Eye Distance:   Bilateral Distance:    Right Eye Near:   Left Eye Near:    Bilateral Near:     Physical Exam Vitals reviewed.  Constitutional:      General: She is awake. She is not in acute distress.    Appearance: Normal appearance. She is well-developed. She is ill-appearing (Chronically ill-appearing).     Comments: Pleasant female appears stated age in no acute distress sitting in wheelchair  HENT:     Head: Normocephalic and atraumatic.     Right Ear: Tympanic membrane, ear canal and  external ear normal. Tympanic membrane is not erythematous or bulging.     Left Ear: Tympanic membrane, ear canal and external ear normal. Tympanic membrane is not erythematous or bulging.     Nose:     Right Sinus: No maxillary sinus tenderness or frontal sinus tenderness.     Left Sinus: No maxillary sinus tenderness or frontal sinus tenderness.     Mouth/Throat:     Dentition: Has dentures.     Pharynx: Uvula midline. No oropharyngeal exudate or posterior oropharyngeal erythema.  Neck:     Comments: Laryngectomy stoma Cardiovascular:     Rate and Rhythm: Normal rate and regular rhythm.     Heart sounds: Normal heart sounds, S1 normal and S2 normal. No murmur heard. Pulmonary:     Effort: Pulmonary effort is normal.     Breath sounds: Wheezing present. No rhonchi or rales.  Comments: Scattered wheezing Psychiatric:        Behavior: Behavior is cooperative.      UC Treatments / Results  Labs (all labs ordered are listed, but only abnormal results are displayed) Labs Reviewed  POC COVID19/FLU A&B COMBO - Abnormal; Notable for the following components:      Result Value   Covid Antigen, POC Positive (*)    All other components within normal limits    EKG   Radiology DG Chest 2 View Result Date: 02/21/2024 CLINICAL DATA:  Headache with body aches and shortness of breath. EXAM: CHEST - 2 VIEW COMPARISON:  May 21, 2023 FINDINGS: The heart size and mediastinal contours are within normal limits. Low lung volumes are noted with mild elevation of the right hemidiaphragm. Mild atelectatic changes are seen within the bilateral lung bases. Stable left lower lobe collapse is noted. No pleural effusion or pneumothorax is identified. The visualized skeletal structures are unremarkable. IMPRESSION: 1. Low lung volumes with mild bibasilar atelectasis. 2. Stable left lower lobe collapse. Electronically Signed   By: Suzen Dials M.D.   On: 02/21/2024 11:42    Procedures Procedures  (including critical care time)  Medications Ordered in UC Medications - No data to display  Initial Impression / Assessment and Plan / UC Course  I have reviewed the triage vital signs and the nursing notes.  Pertinent labs & imaging results that were available during my care of the patient were reviewed by me and considered in my medical decision making (see chart for details).     Patient was noted to be mildly hypoxic on triage but then this improved after she was able to cough and sit quietly for a few minutes.  During her evaluation she does a positive for COVID.  Chest x-ray was obtained that showed no acute cardiopulmonary disease but on recheck she was noted to have an oxygen  saturation in the mid to upper 80s.  This was monitored and remained in the mid to upper 80s and she reported that she did not have oxygen  available at home.  We discussed that given her new oxygen  requirement and complicated medical history I recommended she go to the emergency room.  Offered to call [facility] for transport but she declined this and wanted to contact her daughter.  After discussing with her daughter she was agreeable to go to [facility], ER by [employer].  She was stable at the time of discharge.  Final Clinical Impressions(s) / UC Diagnoses   Final diagnoses:  Acute cough  Acute respiratory failure with hypoxia (HCC)  COVID-19   Discharge Instructions   None    ED Prescriptions   None    PDMP not reviewed this encounter.   Sherrell Rocky POUR, PA-C 02/21/24 1212

## 2024-02-21 NOTE — ED Provider Notes (Signed)
 [place] EMERGENCY DEPARTMENT AT [facility] Provider Note   CSN: [id number] Arrival date & time: 02/21/24  1247     Patient presents with: Shortness of Breath   Deborah [name] is a 68 y.o. female.   Pt is a 68y/o female with hx of COPD/Asthma on Pulmicort , formoterol , revefenacin  nebulizer solutions, prior laryngectomy and stoma, hypothyroidism, hypertension, diabetes who is presenting today from urgent care by EMS due to hypoxia.  Patient reports at urgent care her oxygen  saturation was 88% on room air.  Patient presents today with 3-day history of URI symptoms.  She reports rhinorrhea, congestion, increasing cough.  Denies any fever, chest pain, nausea, vomiting.  She reports that she was feeling more short of breath but did not appear to be related to exertion.  She reports that symptoms have been going on approximately 5 days and today she just felt really bad.  At urgent care temperature was 99.  She currently denies feeling short of breath.  She has not been on steroids recently.  She is up-to-date on her vaccines.  She tested positive for COVID at urgent care but x-ray done at urgent care was reported as negative for evidence of pneumonia.  Patient reports she did a breathing treatment prior to going to urgent care today.  The history is provided by the patient, the EMS personnel and medical records.  Shortness of Breath      Prior to Admission medications   Medication Sig Start Date End Date Taking? Authorizing Provider  predniSONE  (DELTASONE ) 20 MG tablet Take 2 tablets (40 mg total) by mouth daily. 02/21/24  Yes Ladell Bey, [place], MD  budesonide  (PULMICORT ) 0.5 MG/2ML nebulizer solution Take 2 mLs (0.5 mg total) by nebulization daily. 09/17/23   Cleotilde Lukes, DO  buPROPion  (WELLBUTRIN  XL) 150 MG 24 hr tablet TAKE 1 TABLET BY MOUTH DAILY 10/28/23   Cleotilde Lukes, DO  carvedilol  (COREG ) 12.5 MG tablet Take 1 tablet (12.5 mg total) by mouth 2 (two) times daily  with a meal. 08/13/23   Cleotilde Lukes, DO  Continuous Glucose Receiver (DEXCOM G7 RECEIVER) DEVI Apply 1 each topically See admin instructions. 12/02/23   Cleotilde Lukes, DO  Continuous Glucose Sensor (DEXCOM G7 SENSOR) MISC APPLY NEW SENSOR EVERY 10 DAYS 11/12/23   Cleotilde Lukes, DO  EQ ALLERGY RELIEF, CETIRIZINE , 10 MG tablet Take 1 tablet by mouth once daily 09/11/23   Miller, Samantha, DO  escitalopram  (LEXAPRO ) 20 MG tablet Take 1 tablet (20 mg total) by mouth daily. 03/05/23   McDiarmid, Krystal BIRCH, MD  fluticasone  (FLONASE ) 50 MCG/ACT nasal spray Place 1 spray into both nostrils 2 (two) times daily. 02/01/24   Cleotilde Lukes, DO  formoterol  (PERFOROMIST ) 20 MCG/2ML nebulizer solution Take 2 mLs (20 mcg total) by nebulization 2 (two) times daily. 09/17/23   Cleotilde Lukes, DO  Humidifiers MISC 1 application by Tracheal Tube route as needed. 02/05/21   Austin Ade, MD  insulin  lispro (HUMALOG  KWIKPEN) 100 UNIT/ML KwikPen Inject 10 Units into the skin 3 (three) times daily. 10/23/23   McDiarmid, Krystal BIRCH, MD  Insulin  Pen Needle (BD PEN NEEDLE NANO 2ND GEN) 32G X 4 MM MISC Inject 1 each into the skin 4 (four) times daily. 10/26/23   Cleotilde Lukes, DO  LANTUS  SOLOSTAR 100 UNIT/ML Solostar Pen INJECT SUBCUTANEOUSLY 35 UNITS  DAILY 02/08/24   Cleotilde Lukes, DO  levothyroxine  (SYNTHROID ) 200 MCG tablet TAKE 1 TABLET BY MOUTH DAILY  BEFORE BREAKFAST 11/10/23   Cleotilde Lukes, DO  losartan  (  COZAAR ) 50 MG tablet Take 1 tablet (50 mg total) by mouth daily. 11/17/23   McDiarmid, Krystal BIRCH, MD  metFORMIN  (GLUCOPHAGE -XR) 500 MG 24 hr tablet TAKE 1 TABLET BY MOUTH DAILY  WITH BREAKFAST 09/28/23   Cleotilde Lukes, DO  montelukast  (SINGULAIR ) 10 MG tablet TAKE 1 TABLET BY MOUTH AT BEDTIME 09/11/23   Cleotilde Lukes, DO  nystatin  cream (MYCOSTATIN ) Apply 1 Application topically daily as needed (rash under breast stomach).    [provider]  ondansetron  (ZOFRAN ) 4 MG tablet Take 1 tablet (4 mg total) by  mouth every 8 (eight) hours as needed for nausea or vomiting. 02/01/24   Cleotilde Lukes, DO  revefenacin  (YUPELRI ) 175 MCG/3ML nebulizer solution Take 3 mLs (175 mcg total) by nebulization daily. 12/30/23   Kara Dorn NOVAK, MD  rosuvastatin  (CRESTOR ) 40 MG tablet TAKE 1 TABLET BY MOUTH DAILY 01/05/24   Cleotilde Lukes, DO  Semaglutide ,0.25 or 0.5MG /DOS, 2 MG/3ML SOPN Inject 0.5 mg into the skin once a week. 01/25/24   McDiarmid, Krystal BIRCH, MD  sodium chloride  HYPERTONIC 3 % nebulizer solution Take by nebulization 3 (three) times daily. 05/22/23   Kara Dorn NOVAK, MD    Allergies: Lisinopril     Review of Systems  Respiratory:  Positive for shortness of breath.     Updated Vital Signs BP (!) 168/103   Pulse (!) 103   Temp 98.9 F (37.2 C) (Oral)   Resp (!) 21   SpO2 (!) 88%   Physical Exam Vitals and nursing note reviewed.  Constitutional:      General: She is not in acute distress.    Appearance: She is well-developed.  HENT:     Head: Normocephalic and atraumatic.  Eyes:     Pupils: Pupils are equal, round, and reactive to light.  Neck:     Comments: Stoma present without significant secretions or erythema around the stoma Cardiovascular:     Rate and Rhythm: Normal rate and regular rhythm.     Heart sounds: Normal heart sounds. No murmur heard.    No friction rub.  Pulmonary:     Effort: Pulmonary effort is normal. Tachypnea present.     Breath sounds: Normal breath sounds. No wheezing or rales.  Abdominal:     General: Bowel sounds are normal. There is no distension.     Palpations: Abdomen is soft.     Tenderness: There is no abdominal tenderness. There is no guarding or rebound.  Musculoskeletal:        General: No tenderness. Normal range of motion.     Right lower leg: No tenderness.     Left lower leg: No tenderness.     Comments: No edema  Skin:    General: Skin is warm and dry.     Findings: No rash.  Neurological:     Mental Status: She is alert and  oriented to person, place, and time.     Cranial Nerves: No cranial nerve deficit.  Psychiatric:        Behavior: Behavior normal.     (all labs ordered are listed, but only abnormal results are displayed) Labs Reviewed  I-STAT CHEM 8, ED - Abnormal; Notable for the following components:      Result Value   Calcium , Ion 1.09 (*)    All other components within normal limits  CBC WITH DIFFERENTIAL/PLATELET    EKG: EKG Interpretation Date/Time:  Sunday February 21 2024 13:06:38 EDT Ventricular Rate:  88 PR Interval:  169 QRS Duration:  89 QT Interval:  391 QTC Calculation: 474 R Axis:   12  Text Interpretation: Sinus rhythm Probable left atrial enlargement Abnormal T, consider ischemia, lateral leads Minimal ST elevation, anterior leads No significant change since last tracing Confirmed by Doretha Folks ([id number]) on 02/21/2024 1:56:43 PM  Radiology: [facility] Chest 2 View Result Date: 02/21/2024 CLINICAL DATA:  Headache with body aches and shortness of breath. EXAM: CHEST - 2 VIEW COMPARISON:  May 21, 2023 FINDINGS: The heart size and mediastinal contours are within normal limits. Low lung volumes are noted with mild elevation of the right hemidiaphragm. Mild atelectatic changes are seen within the bilateral lung bases. Stable left lower lobe collapse is noted. No pleural effusion or pneumothorax is identified. The visualized skeletal structures are unremarkable. IMPRESSION: 1. Low lung volumes with mild bibasilar atelectasis. 2. Stable left lower lobe collapse. Electronically Signed   By: Suzen Dials M.D.   On: 02/21/2024 11:42     Procedures   Medications Ordered in the ED  predniSONE  (DELTASONE ) tablet 60 mg (60 mg Oral Given 02/21/24 1401)                                    Medical Decision Making Amount and/or Complexity of Data Reviewed Labs: ordered. Decision-making details documented in ED Course.  Risk Prescription drug management.   Pt with multiple medical  problems and comorbidities and presenting today with a complaint that caries a high risk for morbidity and mortality.  Here today with the above complaint.  Patient does not use any oxygen  at home.  She did test positive for COVID and is now on day 5 of symptoms.  X-ray done from urgent care showed no evidence of pneumonia.  Patient is not on oxygen  here and sats are greater than 90% on room air.  Low suspicion for PE, CHF, ACS.  Suspect patient's symptoms are related to COVID and exacerbation of her chronic respiratory diseases.  3:35 PM I independently interpreted patient's labs and EKG and Chem-8 and CBC are within normal limits.  EKG without significant changes.  However when walking patient sats dropped to 80% on room air.  While walking patient denies feeling short of breath. Even when getting back to the room patient's oxygen  saturation is between 82 to 89%.  Discussed this with the patient and recommended admission.  Patient reports she does not want to be admitted and she wants to go home.  Discussed with her that she is hypoxic and could get worse which could cause confusion, respiratory distress, death or being unable to be woken up.  Patient reports she understands that but she does not want to stay and she wants to go home.  Her husband is also present in the room and discussed this with him.  Patient given prednisone  due to concern for exacerbation of her pulmonary disease in light of COVID.  Did discuss return at any time even though she is choosing to leave today      Final diagnoses:  COVID  Hypoxia    ED Discharge Orders          Ordered    predniSONE  (DELTASONE ) 20 MG tablet  Daily        02/21/24 1456               Doretha Folks, MD 02/21/24 1540

## 2024-02-21 NOTE — ED Triage Notes (Signed)
 Pt comes from urgent care with hypoxia. Pt coughing, headaches, body aches, and shob. Symptoms started yesterday. Taking Mucisnex DM. Covid +

## 2024-02-21 NOTE — ED Notes (Signed)
 Patient is being discharged from the Urgent Care and sent to the Emergency Department via carelink . Per Rocky Gilford, PA, patient is in need of higher level of care due to covid, hypoxia, complex comorbidities. Patient is aware and verbalizes understanding of plan of care.  Vitals:   02/21/24 1145 02/21/24 1154  BP:    Pulse:  94  Resp:    Temp:    SpO2: (!) 86% (!) 86%

## 2024-02-22 ENCOUNTER — Inpatient Hospital Stay (HOSPITAL_COMMUNITY)
Admission: EM | Admit: 2024-02-22 | Discharge: 2024-02-28 | DRG: 177 | Disposition: A | Discharge status: Home / Self Care | Attending: Family Medicine | Admitting: Family Medicine

## 2024-02-22 ENCOUNTER — Encounter (HOSPITAL_COMMUNITY): Payer: Self-pay

## 2024-02-22 ENCOUNTER — Emergency Department (HOSPITAL_COMMUNITY)

## 2024-02-22 ENCOUNTER — Other Ambulatory Visit: Payer: Self-pay

## 2024-02-22 DIAGNOSIS — E1165 Type 2 diabetes mellitus with hyperglycemia: Secondary | ICD-10-CM | POA: Diagnosis present

## 2024-02-22 DIAGNOSIS — Z8249 Family history of ischemic heart disease and other diseases of the circulatory system: Secondary | ICD-10-CM

## 2024-02-22 DIAGNOSIS — J9601 Acute respiratory failure with hypoxia: Secondary | ICD-10-CM | POA: Diagnosis present

## 2024-02-22 DIAGNOSIS — R140 Abdominal distension (gaseous): Secondary | ICD-10-CM | POA: Diagnosis not present

## 2024-02-22 DIAGNOSIS — Z7984 Long term (current) use of oral hypoglycemic drugs: Secondary | ICD-10-CM | POA: Diagnosis not present

## 2024-02-22 DIAGNOSIS — E878 Other disorders of electrolyte and fluid balance, not elsewhere classified: Secondary | ICD-10-CM | POA: Diagnosis present

## 2024-02-22 DIAGNOSIS — E669 Obesity, unspecified: Secondary | ICD-10-CM | POA: Diagnosis present

## 2024-02-22 DIAGNOSIS — Z87891 Personal history of nicotine dependence: Secondary | ICD-10-CM

## 2024-02-22 DIAGNOSIS — E785 Hyperlipidemia, unspecified: Secondary | ICD-10-CM | POA: Diagnosis present

## 2024-02-22 DIAGNOSIS — R918 Other nonspecific abnormal finding of lung field: Secondary | ICD-10-CM | POA: Diagnosis not present

## 2024-02-22 DIAGNOSIS — Z7985 Long-term (current) use of injectable non-insulin antidiabetic drugs: Secondary | ICD-10-CM | POA: Diagnosis not present

## 2024-02-22 DIAGNOSIS — J440 Chronic obstructive pulmonary disease with acute lower respiratory infection: Secondary | ICD-10-CM | POA: Diagnosis present

## 2024-02-22 DIAGNOSIS — E871 Hypo-osmolality and hyponatremia: Secondary | ICD-10-CM | POA: Diagnosis present

## 2024-02-22 DIAGNOSIS — Z86000 Personal history of in-situ neoplasm of breast: Secondary | ICD-10-CM

## 2024-02-22 DIAGNOSIS — Z7951 Long term (current) use of inhaled steroids: Secondary | ICD-10-CM | POA: Diagnosis not present

## 2024-02-22 DIAGNOSIS — Z6838 Body mass index (BMI) 38.0-38.9, adult: Secondary | ICD-10-CM

## 2024-02-22 DIAGNOSIS — N1831 Chronic kidney disease, stage 3a: Secondary | ICD-10-CM | POA: Diagnosis present

## 2024-02-22 DIAGNOSIS — U071 COVID-19: Principal | ICD-10-CM | POA: Diagnosis present

## 2024-02-22 DIAGNOSIS — R0602 Shortness of breath: Secondary | ICD-10-CM | POA: Diagnosis not present

## 2024-02-22 DIAGNOSIS — Z923 Personal history of irradiation: Secondary | ICD-10-CM

## 2024-02-22 DIAGNOSIS — I130 Hypertensive heart and chronic kidney disease with heart failure and stage 1 through stage 4 chronic kidney disease, or unspecified chronic kidney disease: Secondary | ICD-10-CM | POA: Diagnosis present

## 2024-02-22 DIAGNOSIS — G9341 Metabolic encephalopathy: Secondary | ICD-10-CM | POA: Diagnosis not present

## 2024-02-22 DIAGNOSIS — J1282 Pneumonia due to coronavirus disease 2019: Secondary | ICD-10-CM | POA: Diagnosis present

## 2024-02-22 DIAGNOSIS — I5032 Chronic diastolic (congestive) heart failure: Secondary | ICD-10-CM | POA: Diagnosis present

## 2024-02-22 DIAGNOSIS — Z794 Long term (current) use of insulin: Secondary | ICD-10-CM | POA: Diagnosis not present

## 2024-02-22 DIAGNOSIS — Z930 Tracheostomy status: Secondary | ICD-10-CM

## 2024-02-22 DIAGNOSIS — E119 Type 2 diabetes mellitus without complications: Secondary | ICD-10-CM

## 2024-02-22 DIAGNOSIS — Z79899 Other long term (current) drug therapy: Secondary | ICD-10-CM

## 2024-02-22 DIAGNOSIS — Z79890 Hormone replacement therapy (postmenopausal): Secondary | ICD-10-CM

## 2024-02-22 DIAGNOSIS — F419 Anxiety disorder, unspecified: Secondary | ICD-10-CM | POA: Diagnosis present

## 2024-02-22 DIAGNOSIS — Z833 Family history of diabetes mellitus: Secondary | ICD-10-CM

## 2024-02-22 DIAGNOSIS — Z96653 Presence of artificial knee joint, bilateral: Secondary | ICD-10-CM | POA: Diagnosis present

## 2024-02-22 DIAGNOSIS — J441 Chronic obstructive pulmonary disease with (acute) exacerbation: Secondary | ICD-10-CM | POA: Diagnosis present

## 2024-02-22 DIAGNOSIS — E039 Hypothyroidism, unspecified: Secondary | ICD-10-CM | POA: Diagnosis present

## 2024-02-22 DIAGNOSIS — J9811 Atelectasis: Secondary | ICD-10-CM | POA: Diagnosis present

## 2024-02-22 DIAGNOSIS — E1122 Type 2 diabetes mellitus with diabetic chronic kidney disease: Secondary | ICD-10-CM | POA: Diagnosis present

## 2024-02-22 DIAGNOSIS — F339 Major depressive disorder, recurrent, unspecified: Secondary | ICD-10-CM

## 2024-02-22 DIAGNOSIS — Z888 Allergy status to other drugs, medicaments and biological substances status: Secondary | ICD-10-CM

## 2024-02-22 DIAGNOSIS — R0600 Dyspnea, unspecified: Secondary | ICD-10-CM | POA: Diagnosis not present

## 2024-02-22 DIAGNOSIS — E875 Hyperkalemia: Secondary | ICD-10-CM | POA: Diagnosis not present

## 2024-02-22 DIAGNOSIS — Z8521 Personal history of malignant neoplasm of larynx: Secondary | ICD-10-CM

## 2024-02-22 DIAGNOSIS — Z789 Other specified health status: Secondary | ICD-10-CM

## 2024-02-22 LAB — CBG MONITORING, ED: Glucose-Capillary: 281 mg/dL — ABNORMAL HIGH (ref 70–99)

## 2024-02-22 LAB — GLUCOSE, CAPILLARY: Glucose-Capillary: 308 mg/dL — ABNORMAL HIGH (ref 70–99)

## 2024-02-22 LAB — BASIC METABOLIC PANEL WITH GFR
Anion gap: 13 (ref 5–15)
BUN: 16 mg/dL (ref 8–23)
CO2: 25 mmol/L (ref 22–32)
Calcium: 8.8 mg/dL — ABNORMAL LOW (ref 8.9–10.3)
Chloride: 96 mmol/L — ABNORMAL LOW (ref 98–111)
Creatinine, Ser: 1.13 mg/dL — ABNORMAL HIGH (ref 0.44–1.00)
GFR, Estimated: 54 mL/min — ABNORMAL LOW (ref >60)
Glucose, Bld: 317 mg/dL — ABNORMAL HIGH (ref 70–99)
Potassium: 4.7 mmol/L (ref 3.5–5.1)
Sodium: 134 mmol/L — ABNORMAL LOW (ref 135–145)

## 2024-02-22 LAB — CBC
HCT: 40.4 % (ref 36.0–46.0)
Hemoglobin: 12.7 g/dL (ref 12.0–15.0)
MCH: 29.8 pg (ref 26.0–34.0)
MCHC: 31.4 g/dL (ref 30.0–36.0)
MCV: 94.8 fL (ref 80.0–100.0)
Platelets: 325 K/uL (ref 150–400)
RBC: 4.26 MIL/uL (ref 3.87–5.11)
RDW: 14 % (ref 11.5–15.5)
WBC: 8 K/uL (ref 4.0–10.5)
nRBC: 0 % (ref 0.0–0.2)

## 2024-02-22 LAB — TROPONIN I (HIGH SENSITIVITY): Troponin I (High Sensitivity): 8 ng/L (ref <18)

## 2024-02-22 LAB — BRAIN NATRIURETIC PEPTIDE: B Natriuretic Peptide: 32 pg/mL (ref 0.0–100.0)

## 2024-02-22 MED ORDER — DEXAMETHASONE 6 MG PO TABS
6.0000 mg | ORAL_TABLET | Freq: Every day | ORAL | Status: DC
Start: 2024-02-23 — End: 2024-03-03
  Administered 2024-02-23 – 2024-02-28 (×6): 6 mg via ORAL
  Filled 2024-02-22: qty 1
  Filled 2024-02-22: qty 2
  Filled 2024-02-22 (×3): qty 1
  Filled 2024-02-22: qty 2

## 2024-02-22 MED ORDER — ALBUTEROL SULFATE (2.5 MG/3ML) 0.083% IN NEBU
10.0000 mg/h | INHALATION_SOLUTION | RESPIRATORY_TRACT | Status: AC
  Administered 2024-02-22: 10 mg/h via RESPIRATORY_TRACT

## 2024-02-22 MED ORDER — ARFORMOTEROL TARTRATE 15 MCG/2ML IN NEBU
15.0000 ug | INHALATION_SOLUTION | Freq: Two times a day (BID) | RESPIRATORY_TRACT | Status: DC
  Administered 2024-02-23 – 2024-02-28 (×10): 15 ug via RESPIRATORY_TRACT
  Filled 2024-02-22 (×10): qty 2

## 2024-02-22 MED ORDER — ENOXAPARIN SODIUM 40 MG/0.4ML IJ SOSY
40.0000 mg | PREFILLED_SYRINGE | INTRAMUSCULAR | Status: DC
  Administered 2024-02-22: 40 mg via SUBCUTANEOUS
  Filled 2024-02-22: qty 0.4

## 2024-02-22 MED ORDER — INSULIN ASPART 100 UNIT/ML IJ SOLN
10.0000 [IU] | Freq: Once | INTRAMUSCULAR | Status: AC
  Administered 2024-02-22: 10 [IU] via SUBCUTANEOUS

## 2024-02-22 MED ORDER — REVEFENACIN 175 MCG/3ML IN SOLN: 175.0000 ug | Freq: Every day | RESPIRATORY_TRACT | Status: DC

## 2024-02-22 MED ORDER — IPRATROPIUM-ALBUTEROL 0.5-2.5 (3) MG/3ML IN SOLN: 3.0000 mL | RESPIRATORY_TRACT | Status: DC | PRN

## 2024-02-22 MED ORDER — INSULIN ASPART 100 UNIT/ML IJ SOLN
0.0000 [IU] | Freq: Three times a day (TID) | INTRAMUSCULAR | Status: DC
  Administered 2024-02-23: 7 [IU] via SUBCUTANEOUS
  Administered 2024-02-23: 11 [IU] via SUBCUTANEOUS
  Administered 2024-02-23 – 2024-02-24 (×3): 4 [IU] via SUBCUTANEOUS
  Administered 2024-02-25 – 2024-02-26 (×2): 11 [IU] via SUBCUTANEOUS
  Administered 2024-02-26 – 2024-02-27 (×2): 3 [IU] via SUBCUTANEOUS
  Administered 2024-02-27: 7 [IU] via SUBCUTANEOUS
  Administered 2024-02-28: 4 [IU] via SUBCUTANEOUS

## 2024-02-22 MED ORDER — ESCITALOPRAM OXALATE 20 MG PO TABS
20.0000 mg | ORAL_TABLET | Freq: Every day | ORAL | Status: DC
  Administered 2024-02-23 – 2024-02-28 (×6): 20 mg via ORAL
  Filled 2024-02-22: qty 1
  Filled 2024-02-22: qty 2
  Filled 2024-02-22 (×4): qty 1

## 2024-02-22 MED ORDER — MONTELUKAST SODIUM 10 MG PO TABS
10.0000 mg | ORAL_TABLET | Freq: Every day | ORAL | Status: DC
  Administered 2024-02-22 – 2024-02-27 (×6): 10 mg via ORAL
  Filled 2024-02-22 (×6): qty 1

## 2024-02-22 MED ORDER — HYDRALAZINE HCL 20 MG/ML IJ SOLN
5.0000 mg | Freq: Once | INTRAMUSCULAR | Status: AC
  Administered 2024-02-23: 5 mg via INTRAVENOUS
  Filled 2024-02-22: qty 1

## 2024-02-22 MED ORDER — METHYLPREDNISOLONE SODIUM SUCC 125 MG IJ SOLR
125.0000 mg | Freq: Once | INTRAMUSCULAR | Status: AC
  Administered 2024-02-22: 125 mg via INTRAVENOUS
  Filled 2024-02-22: qty 2

## 2024-02-22 MED ORDER — HYDRALAZINE HCL 20 MG/ML IJ SOLN: 5.0000 mg | Freq: Once | INTRAMUSCULAR | Status: DC

## 2024-02-22 MED ORDER — LOSARTAN POTASSIUM 50 MG PO TABS
50.0000 mg | ORAL_TABLET | Freq: Every day | ORAL | Status: DC
  Administered 2024-02-22: 50 mg via ORAL
  Filled 2024-02-22: qty 1

## 2024-02-22 MED ORDER — INSULIN GLARGINE 100 UNIT/ML ~~LOC~~ SOLN
10.0000 [IU] | Freq: Every day | SUBCUTANEOUS | Status: DC
Start: 2024-02-23 — End: 2024-02-23
  Filled 2024-02-22: qty 0.1

## 2024-02-22 MED ORDER — ALBUTEROL SULFATE (2.5 MG/3ML) 0.083% IN NEBU
10.0000 mg/h | INHALATION_SOLUTION | RESPIRATORY_TRACT | Status: AC
  Administered 2024-02-22: 10 mg/h via RESPIRATORY_TRACT
  Filled 2024-02-22: qty 3
  Filled 2024-02-22 (×2): qty 12

## 2024-02-22 MED ORDER — HYDROXYZINE HCL 10 MG PO TABS
10.0000 mg | ORAL_TABLET | ORAL | Status: DC | PRN
  Administered 2024-02-22 – 2024-02-24 (×2): 10 mg via ORAL
  Filled 2024-02-22 (×2): qty 1

## 2024-02-22 MED ORDER — BUDESONIDE 0.5 MG/2ML IN SUSP
0.5000 mg | Freq: Every day | RESPIRATORY_TRACT | Status: DC
  Administered 2024-02-22 – 2024-02-28 (×7): 0.5 mg via RESPIRATORY_TRACT
  Filled 2024-02-22 (×7): qty 2

## 2024-02-22 MED ORDER — ROSUVASTATIN CALCIUM 20 MG PO TABS
40.0000 mg | ORAL_TABLET | Freq: Every day | ORAL | Status: DC
  Administered 2024-02-22 – 2024-02-28 (×7): 40 mg via ORAL
  Filled 2024-02-22 (×7): qty 2

## 2024-02-22 MED ORDER — MELATONIN 3 MG PO TABS
3.0000 mg | ORAL_TABLET | Freq: Every day | ORAL | Status: DC
Start: 2024-02-22 — End: 2024-02-28
  Administered 2024-02-22 – 2024-02-27 (×6): 3 mg via ORAL
  Filled 2024-02-22 (×6): qty 1

## 2024-02-22 MED ORDER — CARVEDILOL 12.5 MG PO TABS
12.5000 mg | ORAL_TABLET | Freq: Two times a day (BID) | ORAL | Status: DC
  Administered 2024-02-23 – 2024-02-28 (×11): 12.5 mg via ORAL
  Filled 2024-02-22 (×11): qty 1

## 2024-02-22 MED ORDER — IPRATROPIUM-ALBUTEROL 0.5-2.5 (3) MG/3ML IN SOLN
RESPIRATORY_TRACT | Status: AC
  Administered 2024-02-22: 3 mL
  Filled 2024-02-22: qty 3

## 2024-02-22 MED ORDER — IPRATROPIUM-ALBUTEROL 0.5-2.5 (3) MG/3ML IN SOLN
3.0000 mL | RESPIRATORY_TRACT | Status: AC
Start: 2024-02-23 — End: 2024-02-23
  Filled 2024-02-22: qty 3

## 2024-02-22 MED ORDER — LEVOTHYROXINE SODIUM 100 MCG PO TABS
200.0000 ug | ORAL_TABLET | Freq: Every day | ORAL | Status: DC
  Administered 2024-02-24 – 2024-02-27 (×4): 200 ug via ORAL
  Filled 2024-02-22 (×4): qty 2

## 2024-02-22 MED ORDER — REVEFENACIN 175 MCG/3ML IN SOLN
175.0000 ug | Freq: Every day | RESPIRATORY_TRACT | Status: DC
  Administered 2024-02-23 – 2024-02-28 (×6): 175 ug via RESPIRATORY_TRACT
  Filled 2024-02-22 (×6): qty 3

## 2024-02-22 MED ORDER — GUAIFENESIN ER 600 MG PO TB12
600.0000 mg | ORAL_TABLET | Freq: Two times a day (BID) | ORAL | Status: DC
  Administered 2024-02-22 – 2024-02-28 (×12): 600 mg via ORAL
  Filled 2024-02-22 (×12): qty 1

## 2024-02-22 MED ORDER — BUPROPION HCL ER (XL) 150 MG PO TB24
150.0000 mg | ORAL_TABLET | Freq: Every day | ORAL | Status: DC
  Administered 2024-02-23 – 2024-02-28 (×6): 150 mg via ORAL
  Filled 2024-02-22 (×6): qty 1

## 2024-02-22 MED ORDER — ACETAMINOPHEN 325 MG PO TABS
650.0000 mg | ORAL_TABLET | Freq: Four times a day (QID) | ORAL | Status: DC | PRN
  Administered 2024-02-22: 650 mg via ORAL
  Filled 2024-02-22: qty 2

## 2024-02-22 MED ORDER — ONDANSETRON 4 MG PO TBDP: 4.0000 mg | ORAL_TABLET | Freq: Three times a day (TID) | ORAL | Status: DC | PRN

## 2024-02-22 MED ORDER — ARFORMOTEROL TARTRATE 15 MCG/2ML IN NEBU
15.0000 ug | INHALATION_SOLUTION | Freq: Two times a day (BID) | RESPIRATORY_TRACT | Status: DC
  Administered 2024-02-22: 15 ug via RESPIRATORY_TRACT
  Filled 2024-02-22: qty 2

## 2024-02-22 MED ORDER — ACETAMINOPHEN 650 MG RE SUPP: 650.0000 mg | Freq: Four times a day (QID) | RECTAL | Status: DC | PRN

## 2024-02-22 NOTE — ED Notes (Signed)
 Discussed with  RT to wean off O2 on pt. O2 reduced

## 2024-02-22 NOTE — Assessment & Plan Note (Addendum)
 Anxiety - continue home Lexapro  20 mg, bupropion  24-hour 150 mg HTN - continue home losartan  50 mg, carvedilol  12.5 mg 2 times daily Asthma - continue home montelukast  10 mg HLD - continue home rosuvastatin  40 mg T2DM - Resistant SSI, will add back Lantus  based on how she is tolerating her diet and sugars COPD - continue home budesonide , formoterol , and yupelri  nebulizers

## 2024-02-22 NOTE — Hospital Course (Addendum)
 Deborah [name] is a 68 y.o.female with a history of COPD,  who was admitted to the [facility] Medicine Teaching Service at [facility] for AHRF in the setting of COVID. Her hospital course is detailed below:  AHRF  COVID Presented with new oxygen  requirement, positive COVID swab, and chest x-ray with atypical pneumonia.  Despite treatment, she had worsening respiratory distress and was transferred to ICU overnight.  She was continued on steroids and triple therapy nebulizers. Had an episode of brief metabolic encephalopathy for which she was placed on and tapered off of Precedex . She left the ICU on 9/10 in no respiratory distress and was weaned off of supplemental oxygen  until her discharge on 02/28/24. She was also started on Baracitinib and Prednisone  course for 6  days, this was discontinued at discharge .  T2DM CBGs elevated in the setting of receiving steroids.  Insulin  was titrated over admission with long-acting and short acting insulin . She was discharged on home diabetic regimen.  HTN Briefly required labetalol  infusion as needed for elevated blood pressure in the ICU.  She was restarted on her home medications prior to discharge and remained stable.  Hyperkalemia Potassium elevated to 5.8 in the ICU.  She received insulin  and bicarbonate with improvement in levels by discharge.  Hypothyroidism TSH elevated to 9.426. Increased levothyroxine  to 100 mcg daily to 225 mcg daily.  Other chronic conditions were medically managed with home medications and formulary alternatives as necessary (Anxiety, asthma, HLD, COPD)  PCP Follow-up Recommendations: Recheck TSH in 4-6 weeks, and titrate as needed.

## 2024-02-22 NOTE — ED Notes (Signed)
 RT called, pt states she's having difficulty breathing. Pt also ambulated to the bathroom, steady gait.

## 2024-02-22 NOTE — Progress Notes (Signed)
   02/22/24 1712  Oxygen  Therapy/Pulse Ox  O2 Flow Rate (L/min) (S)  12 L/min  FiO2 (%) (S)  70 %  SpO2 95 %   Had to increaser O2 due to pt desat in the 70's. RT will continue to monitor.

## 2024-02-22 NOTE — ED Provider Notes (Signed)
 [place] EMERGENCY DEPARTMENT AT [facility] Provider Note   CSN: [id number] Arrival date & time: 02/22/24  1444     Patient presents with: Shortness of Breath and COVID   Deborah [name] is a 68 y.o. female.    Shortness of Breath    This patient is a 68 year old female who was seen 24 hours prior because of shortness of breath diagnosed with COVID-19 at the urgent care yesterday after having 3 days of upper respiratory symptoms.  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  Her oxygen  levels were between 82 and 89% when she was in the emergency department but she refused and went home.  The patient states she does not have oxygen  at home but does have a nebulizer treatments which she uses regularly and actually used 3 this morning before feeling like she was more short of breath, checked her oxygen  to be 68% at home.  She presents by EMS getting continuous nebulizer therapy secondary to her increased work of breathing and hypoxia.  She denies fevers vomiting or diarrhea, she does have a cough  Prior to Admission medications   Medication Sig Start Date End Date Taking? Authorizing Provider  budesonide  (PULMICORT ) 0.5 MG/2ML nebulizer solution Take 2 mLs (0.5 mg total) by nebulization daily. 09/17/23   Cleotilde Lukes, DO  buPROPion  (WELLBUTRIN  XL) 150 MG 24 hr tablet TAKE 1 TABLET BY MOUTH DAILY 10/28/23   Cleotilde Lukes, DO  carvedilol  (COREG ) 12.5 MG tablet Take 1 tablet (12.5 mg total) by mouth 2 (two) times daily with a meal. 08/13/23   Cleotilde Lukes, DO  Continuous Glucose Receiver (DEXCOM G7 RECEIVER) DEVI Apply 1  each topically See admin instructions. 12/02/23   Cleotilde Lukes, DO  Continuous Glucose Sensor (DEXCOM G7 SENSOR) MISC APPLY NEW SENSOR EVERY 10 DAYS 11/12/23   Cleotilde Lukes, DO  EQ ALLERGY RELIEF, CETIRIZINE , 10 MG tablet Take 1 tablet by mouth once daily 09/11/23   Mishel Sans, Samantha, DO  escitalopram  (LEXAPRO ) 20 MG tablet Take 1 tablet (20 mg total) by mouth daily. 03/05/23   McDiarmid, Krystal BIRCH, MD  fluticasone  (FLONASE ) 50 MCG/ACT nasal spray Place 1 spray into both nostrils 2 (two) times daily. 02/01/24   Cleotilde Lukes, DO  formoterol  (PERFOROMIST ) 20 MCG/2ML nebulizer solution Take 2 mLs (20 mcg total) by nebulization 2 (two) times daily. 09/17/23   Cleotilde Lukes, DO  Humidifiers MISC 1 application by Tracheal Tube route as needed. 02/05/21   Austin Ade, MD  insulin  lispro (HUMALOG  KWIKPEN) 100 UNIT/ML KwikPen Inject 10 Units into the skin 3 (three) times daily. 10/23/23   McDiarmid, Krystal BIRCH, MD  Insulin  Pen Needle (BD PEN NEEDLE NANO 2ND GEN) 32G X 4 MM MISC Inject 1 each into the skin 4 (four) times daily. 10/26/23   Cleotilde Lukes, DO  LANTUS  SOLOSTAR 100 UNIT/ML Solostar Pen INJECT SUBCUTANEOUSLY 35 UNITS  DAILY 02/08/24   Cleotilde Lukes, DO  levothyroxine  (SYNTHROID ) 200 MCG tablet TAKE 1 TABLET BY MOUTH DAILY  BEFORE BREAKFAST 11/10/23   Cleotilde Lukes, DO  losartan  (COZAAR ) 50 MG tablet Take 1 tablet (50 mg total) by mouth daily. 11/17/23  McDiarmid, Krystal BIRCH, MD  metFORMIN  (GLUCOPHAGE -XR) 500 MG 24 hr tablet TAKE 1 TABLET BY MOUTH DAILY  WITH BREAKFAST 09/28/23   Cleotilde Lukes, DO  montelukast  (SINGULAIR ) 10 MG tablet TAKE 1 TABLET BY MOUTH AT BEDTIME 09/11/23   Linzy Laury, Samantha, DO  nystatin  cream (MYCOSTATIN ) Apply 1 Application topically daily as needed (rash under breast stomach).    [provider]  ondansetron  (ZOFRAN ) 4 MG tablet Take 1 tablet (4 mg total) by mouth every 8 (eight) hours as needed for nausea or vomiting. 02/01/24   Cleotilde Lukes, DO  predniSONE   (DELTASONE ) 20 MG tablet Take 2 tablets (40 mg total) by mouth daily. 02/21/24   Doretha Folks, MD  revefenacin  (YUPELRI ) 175 MCG/3ML nebulizer solution Take 3 mLs (175 mcg total) by nebulization daily. 12/30/23   Kara Dorn NOVAK, MD  rosuvastatin  (CRESTOR ) 40 MG tablet TAKE 1 TABLET BY MOUTH DAILY 01/05/24   Cleotilde Lukes, DO  Semaglutide ,0.25 or 0.5MG /DOS, 2 MG/3ML SOPN Inject 0.5 mg into the skin once a week. 01/25/24   McDiarmid, Krystal BIRCH, MD  sodium chloride  HYPERTONIC 3 % nebulizer solution Take by nebulization 3 (three) times daily. 05/22/23   Kara Dorn NOVAK, MD    Allergies: Lisinopril     Review of Systems  Respiratory:  Positive for shortness of breath.   All other systems reviewed and are negative.   Updated Vital Signs BP (!) 167/77   Pulse (!) 108   Temp 98.2 F (36.8 C) (Oral)   Resp 19   SpO2 95%   Physical Exam Vitals and nursing note reviewed.  Constitutional:      General: She is in acute distress.     Appearance: She is well-developed. She is ill-appearing.  HENT:     Head: Normocephalic and atraumatic.     Mouth/Throat:     Pharynx: No oropharyngeal exudate.  Eyes:     General: No scleral icterus.       Right eye: No discharge.        Left eye: No discharge.     Conjunctiva/sclera: Conjunctivae normal.     Pupils: Pupils are equal, round, and reactive to light.  Neck:     Thyroid : No thyromegaly.     Vascular: No JVD.  Cardiovascular:     Rate and Rhythm: Regular rhythm. Tachycardia present.     Heart sounds: Normal heart sounds. No murmur heard.    No friction rub. No gallop.  Pulmonary:     Effort: Respiratory distress present.     Breath sounds: Wheezing and rhonchi present. No rales.  Abdominal:     General: Bowel sounds are normal. There is no distension.     Palpations: Abdomen is soft. There is no mass.     Tenderness: There is no abdominal tenderness.  Musculoskeletal:        General: No tenderness. Normal range of motion.      Cervical back: Normal range of motion and neck supple.     Right lower leg: No tenderness. No edema.     Left lower leg: No tenderness. No edema.  Lymphadenopathy:     Cervical: No cervical adenopathy.  Skin:    General: Skin is warm and dry.     Findings: No erythema or rash.  Neurological:     Mental Status: She is alert.     Coordination: Coordination normal.  Psychiatric:        Behavior: Behavior normal.     (all labs ordered are listed, but only  abnormal results are displayed) Labs Reviewed  BASIC METABOLIC PANEL WITH GFR - Abnormal; Notable for the following components:      Result Value   Sodium 134 (*)    Chloride 96 (*)    Glucose, Bld 317 (*)    Creatinine, Ser 1.13 (*)    Calcium  8.8 (*)    GFR, Estimated 54 (*)    All other components within normal limits  CBG MONITORING, ED - Abnormal; Notable for the following components:   Glucose-Capillary 281 (*)    All other components within normal limits  CBC  BRAIN NATRIURETIC PEPTIDE  TROPONIN I (HIGH SENSITIVITY)    EKG: EKG Interpretation Date/Time:  Monday February 22 2024 14:52:59 EDT Ventricular Rate:  113 PR Interval:  162 QRS Duration:  88 QT Interval:  348 QTC Calculation: 478 R Axis:   62  Text Interpretation: Sinus tachycardia Anterior infarct, old Nonspecific T abnormalities, lateral leads Since last tracing rate faster Confirmed by Cleotilde Rogue ([id number]) on 02/22/2024 2:56:55 PM  Radiology: [facility] Chest Port 1 View Result Date: 02/22/2024 CLINICAL DATA:  Short of breath, COVID positive EXAM: PORTABLE CHEST 1 VIEW COMPARISON:  02/21/2024 FINDINGS: Single frontal view of the chest demonstrates a stable cardiac silhouette. There is scattered interstitial and ground-glass opacities throughout the lungs, compatible with atypical pneumonia and consistent with given history of COVID positive. Dense consolidation and volume loss in the left lower lobe consistent with known atelectasis. No effusion or pneumothorax.  No acute bony abnormalities. IMPRESSION: 1. Scattered interstitial and ground-glass opacities consistent with atypical pneumonia, compatible with given history of COVID positive. 2. Chronic left lower lobe atelectasis. Electronically Signed   By: Ozell Daring M.D.   On: 02/22/2024 15:27   DG Chest 2 View Result Date: 02/21/2024 CLINICAL DATA:  Headache with body aches and shortness of breath. EXAM: CHEST - 2 VIEW COMPARISON:  May 21, 2023 FINDINGS: The heart size and mediastinal contours are within normal limits. Low lung volumes are noted with mild elevation of the right hemidiaphragm. Mild atelectatic changes are seen within the bilateral lung bases. Stable left lower lobe collapse is noted. No pleural effusion or pneumothorax is identified. The visualized skeletal structures are unremarkable. IMPRESSION: 1. Low lung volumes with mild bibasilar atelectasis. 2. Stable left lower lobe collapse. Electronically Signed   By: Suzen Dials M.D.   On: 02/21/2024 11:42     .Critical Care  Performed by: Cleotilde Rogue, MD Authorized by: Cleotilde Rogue, MD   Critical care provider statement:    Critical care time (minutes):  45   Critical care time was exclusive of:  Separately billable procedures and treating other patients and teaching time   Critical care was necessary to treat or prevent imminent or life-threatening deterioration of the following conditions:  Respiratory failure   Critical care was time spent personally by me on the following activities:  Development of treatment plan with patient or surrogate, discussions with consultants, evaluation of patient's response to treatment, examination of patient, obtaining history from patient or surrogate, review of old charts, re-evaluation of patient's condition, pulse oximetry, ordering and review of radiographic studies, ordering and review of laboratory studies and ordering and performing treatments and interventions   I assumed direction of  critical care for this patient from another provider in my specialty: no     Care discussed with: admitting provider   Comments:          Medications Ordered in the ED  albuterol  (PROVENTIL ) (2.5 MG/3ML) 0.083%  nebulizer solution (0 mg/hr Nebulization Stopped 02/22/24 1710)  methylPREDNISolone  sodium succinate (SOLU-MEDROL ) 125 mg/2 mL injection 125 mg (125 mg Intravenous Given 02/22/24 1532)                                    Medical Decision Making Amount and/or Complexity of Data Reviewed Labs: ordered. Radiology: ordered. ECG/medicine tests: ordered.  Risk Prescription drug management. Decision regarding hospitalization.   Patient examined, appears to have respiratory distress, multiple lung abnormalities including both wheezing and rhonchi, oxygenating at a low level but getting nebulized therapy.  In the mid 80% range at this time.  She is able to speak by including her stoma and states that she is feeling better after nebulizer treatments by EMS.  EKG reviewed showing sinus tachycardia without any other new acute findings.  I suspect that the patient has ongoing hypoxia secondary to the underlying significant and severe viral infection for this patient, she is certainly going to be compromised by history of having a tracheostomy stoma, by having a history of COPD, her obesity etc.  If she does not improve she will likely need to be admitted to the hospital.  The patient is agreeable to this plan.  Labs:  I  personally viewed and interpreted the labs which show reviewed testing from outside facility at urgent care where she tested positive for COVID, labs here show hyperglycemia without DKA, CBC without leukocytosis or anemia, metabolic panel without significant renal insufficiency, troponin is 8 and BNP is 32   Radiology Imaging: I personally viewed the images of the ordered radiographic studies and find findings consistent with COVID-pneumonia, atypical pneumonia or otherwise,  there are scattered ground glass opacities I agree with the radiologist interpretation as well  Cardiac monitoring: Patient is in mild sinus tachycardia, this is gradually improving  ED course: The patient required oxygen  supplementation, continuous nebulizer therapy, steroids, unfortunately she appears to have hypoxemic respiratory failure secondary to COVID and will need to be admitted  Consultations: Will discuss with internal medicine or family practice doctors to place patient in the hospital for ongoing hypoxia.  Doubt pulmonary embolism, patient is agreeable to the plan, she is critically ill, see above.  Discussed case with admitting doctors at about 5:10 PM, they will come admit     Final diagnoses:  Acute hypoxemic respiratory failure due to COVID-19 [facility])    ED Discharge Orders     None          Cleotilde Rogue, MD 02/22/24 1715

## 2024-02-22 NOTE — Plan of Care (Signed)
 Message by RN regarding multiple bouts of respiratory distress despite RT intervention with suctioning and breathing treatments.  Patient assessed at bedside, she reports significant difficulty breathing.  Sitting up at the edge of the bed with tachypnea with oxygen  saturation around 86-88% on trach collar.  Patient adamantly requesting ICU evaluation given concern her breathing is getting worse imminently.  Does state she would be open to intubation or what ever is necessary to help her breathe.  Paged CCM on-call Dr. Layman, she will see the patient again for evaluation.  Theophilus Pagan, MD 02/22/2024, 11:34 PM PGY-3, [facility] Family Medicine Service pager [phone]

## 2024-02-22 NOTE — Assessment & Plan Note (Signed)
 Patient afebrile, but continues to be tachypneic and tachycardic.  COVID-positive at [facility] 9/7.  New oxygen  requirement 12 L nebulized O2 via tracheostomy stoma. CXR showed scattered interstitial and ground glass opacities consistent with atypical pneumonia, compatible given COVID-positive and chronic LLL atelectasis.  S/p albuterol  nebulizer and methylprednisolone  in the ED. - Admit to [facility], attending Dr. Madelon - Progressive, Vital signs per floor - Tylenol  650 mg as needed every 6 hours - Dexamethasone  6 mg x 10 days per guidelines, to start 9/9 - Discussed care with pulmonologist, Dr. Harlene Na, d/t escalating oxygen  requirement and concern for acute decompensation w/ her hx of COPD, asthma, and tracheostomy stoma and possibly requiring Tocilizumab per hospital guidelines which requires ICU care; PCCM to assess - Regular diet   - Zofran -ODT 4 mg Q8HR PRN  - Wean O2 as tolerated, goal of 88-92% due to COPD history  - PT/OT to treat - VTE prophylaxis: Lovenox  40 mg - AM BMP

## 2024-02-22 NOTE — Plan of Care (Signed)
 FMTS Interim Progress Note  Patient assessed at bedside as part of night rounds.  RT present, states she had just suctioned out to, large crusty mucous plugs from tracheostomy site.  Per RN patient was desatting on 12L trach collar felt to be secondary to mucous plugging.  Patient initially short of breath but settled out throughout exam.  Reports she has been unable to sleep or eat for the past 2 days, continuously drinking fluids.  States she does have some intermittent chest pain, mostly with coughing and strain of her ribs.  Denies prolonged pressure sensation.  Reports she is experiencing a headache and would like medication for it.  Also requesting something to help her sleep.  O: BP (!) 201/124   Pulse (!) 111   Temp 98.4 F (36.9 C) (Oral)   Resp (!) 22   SpO2 91%    General: Sitting up in the edge of the bed, initially in mild distress with tachypnea but improved throughout exam.  Mucus/bloody discharge present around tracheostomy site. CV: Tachycardia.  Regular rhythm.  No murmur RESP: Mildly increased work of breathing on 12L trach collar on 100% oxygen .  Diffuse coarse breath sounds throughout with wheezing noted in 2 lung fields. EXT: Nontender to palpation over bilateral calfs   A/P: Acute hypoxic respiratory failure secondary to COVID PNA Some increased respiratory distress with borderline oxygen  saturation upon exam, likely secondary to mucous plugging.  Seen by CCM, no indication for ICU level care or commentary about COVID treatment.  Appreciate RT assistance with clearance will monitor respiratory status closely.  Home breathing treatments ordered.  Bloody mucus with tachycardia and dyspnea, can consider additional PE workup.  Component of anxiety, will treat cautiously. -Scheduled DuoNebs Q 4-hour x 2 doses prior to revefenacin  administration in the AM -Mucinex  twice daily -Hydroxyzine  10 mg as needed  T2DM CBGs persistently elevated in the setting of steroids,  required 10 units NovoLog .  Will add back reduced dose of long-acting insulin . -Start Semglee  10 units daily  Remainder of plan per day team.  Orders reviewed and updated as needed.   Theophilus Pagan, MD 02/22/2024, 10:41 PM PGY-3, [facility] Family Medicine Service pager [phone]

## 2024-02-22 NOTE — ED Notes (Signed)
RT called for trache care

## 2024-02-22 NOTE — ED Triage Notes (Signed)
 PT BIB [facility] from home, pt was diagnosed at [facility] ED yesterday with COVID wanted her to stay over night for observations but she wanted to leave because she was satting at 90%. Today pt was SOB 65% RA took her neb, upon EMS arrival 50% RA  she was put  on 15L satting 88% ems gave duo neb got her up to 97%. Pt has diminished cough hx of copd ,  she does have a trach.   BP 224/118 HR 106 AOX4

## 2024-02-22 NOTE — ED Notes (Addendum)
 Deborah [name]

## 2024-02-22 NOTE — Consult Note (Signed)
 NAME:  Deborah [name], MRN:  [id number], DOB:  05/07/1956, LOS: 0 ADMISSION DATE:  02/22/2024, CONSULTATION DATE:  02/22/24 REFERRING MD:  Family medicine, CHIEF COMPLAINT:  covid   History of Present Illness:  68 yo female presented after refusing admission yesterday with positive covid result and hypoxia at urgent care. Pt then called EMS services today was again noted to be hypoxic, infiltrates on imaging. Complaints of shortness of breath and cough. Denies sputum production. No f/c no n/v/d. Denies chest pain. States she feels fine just notes her oxygen  is low and she gets sob with a lot of mobility. No other associated symptoms  Pt reportedly had symptoms for 3 days prior to presentation to urgent care. 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 Pt states that she takes nebulizers at home thru her stoma which is the route she has currently been utilizing her nebs during this acute illness. She endorses taking her usually scheduled medications.    CCM was asked to eval per family medicine attending based on reported hospital protocol.    Pt was noted to be setting up on edge of bed eating a full dinner tray oxygen  with trach collar in place via stoma blowing by 11L.  She is in NO acute distress. Able to communicate without dyspnea, no accessory muscle use. When stoma is occluded speaks in full sentences without any difficulty. Provides history and also confirms her aversion to transfer to ICU when she feels fine.  Pertinent  Medical History  T2dm with hypglycemia Copd Htn Hyperlipidemia Anxiety Breast cancer (R) Hypothyroidism HFpEF (2024 LVEF 70%)  Significant Hospital Events: Including procedures, antibiotic start and stop dates in addition to other pertinent events   Presented to urgent care 9/7 (refused hospital eval and admission) Admitted to hospital 9/8  Interim History / Subjective:    Objective    Blood pressure (!) 167/77, pulse (!) 104,  temperature 98.2 F (36.8 C), temperature source Oral, resp. rate 14, SpO2 94%.    FiO2 (%):  [30 %-70 %] 70 %  No intake or output data in the 24 hours ending 02/22/24 1956 There were no vitals filed for this visit.  Examination: General: nad sitting up on edge of bed eating dinner HENT: ncat, eomi, perrla, mmmp, tracheal stoma in place  Lungs: rhonchi bilaterally with intermittent wheeze Cardiovascular: tachycardic Abdomen: soft, nt, nd bs + Extremities: no c/c/e Neuro: no focal deficits GU: deferred  Resolved problem list   Assessment and Plan  Covid pneumonia Acute hypoxic resp failure Copd with acute exacerbation T2dm with hyperglycemia Hypothyroidism H/o htn Hyperlipidemia HFpEF H/o breast cancer Ckd3a Hyponatremia Hypochloremia -titrate oxygen  to sat >88% -self proning is usually beneficial for covid infections as well although no ct chest was obtained for the patient while she was in the ED, no need to send down for such at this moment now.  -nebs, steroids -primary also ordered regular diet for pt during acute resp issues despite request for ccm eval, which is usually contraindicated for pt's requiring ICU admission for resp failure.  -supportive care -check rvp and sputum cx despite positive covid -support with NIV if needed, not at this time -cont home meds -ssi -monitor renal indices -cont ivf until PO intake improved with electrolyte abnormalities -if appears to worsen from septic standpoint would start empiric abx until sputum sample obtained for possible superimposed bacterial pneumonia - at this time pt is afebrile and without elevated wbc    Labs  CBC: Recent Labs  Lab 02/21/24 1321 02/21/24 1350 02/22/24 1532  WBC 6.2  --  8.0  NEUTROABS 4.2  --   --   HGB 12.5 13.3 12.7  HCT 39.0 39.0 40.4  MCV 92.9  --  94.8  PLT 278  --  325    Basic Metabolic Panel: Recent Labs  Lab 02/21/24 1350 02/22/24 1532  NA 140 134*  K 4.1 4.7  CL 102  96*  CO2  --  25  GLUCOSE 73 317*  BUN 11 16  CREATININE 1.00 1.13*  CALCIUM   --  8.8*   GFR: CrCl cannot be calculated (Unknown ideal weight.). Recent Labs  Lab 02/21/24 1321 02/22/24 1532  WBC 6.2 8.0    Liver Function Tests: No results for input(s): AST, ALT, ALKPHOS, BILITOT, PROT, ALBUMIN  in the last 168 hours. No results for input(s): LIPASE, AMYLASE in the last 168 hours. No results for input(s): AMMONIA in the last 168 hours.  ABG    Component Value Date/Time   PHART 7.445 08/02/2012 1959   PCO2ART 39.8 08/02/2012 1959   PO2ART 94.5 08/02/2012 1959   HCO3 21.3 05/23/2023 1523   TCO2 28 02/21/2024 1350   ACIDBASEDEF 2.0 05/23/2023 1523   O2SAT 97 05/23/2023 1523     Coagulation Profile: No results for input(s): INR, PROTIME in the last 168 hours.  Cardiac Enzymes: No results for input(s): CKTOTAL, CKMB, CKMBINDEX, TROPONINI in the last 168 hours.  HbA1C: HbA1c, POC (controlled diabetic range)  Date/Time Value Ref Range Status  01/25/2024 09:01 AM 6.5 0.0 - 7.0 % Final  10/08/2023 03:09 PM 10.5 (A) 0.0 - 7.0 % Final    CBG: Recent Labs  Lab 02/22/24 1539  GLUCAP 281*    Review of Systems:   As per HPI  Past Medical History:  She,  has a past medical history of Anemia, Anxiety, Arthritis, Asthma, Breast cancer (HCC), Bronchitis, Carpal tunnel syndrome (03/16/2018), Chronic pain (01/20/2012), COPD (chronic obstructive pulmonary disease) (HCC), Depression, Diabetes mellitus without complication (HCC), Diverticulosis (11/15/2012), DOE (dyspnea on exertion) (04/28/2014), Esophageal stricture, FB bronchus, Former smoker (03/19/2011), GERD (gastroesophageal reflux disease), Heart murmur, History of laryngectomy, History of radiation therapy (11/15/18- 12/06/18), radiation therapy (09/03/10 to 10/16/2010), Hypertension, Hypothyroid, Internal hemorrhoid (11/15/2012), 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   Surgical History:   Past Surgical History:  Procedure Laterality Date   BREAST LUMPECTOMY Right 07/06/2018   BREAST LUMPECTOMY WITH RADIOACTIVE SEED LOCALIZATION Right 07/06/2018   Procedure: RIGHT BREAST LUMPECTOMY WITH RADIOACTIVE SEED LOCALIZATION;  Surgeon: Ebbie Cough, MD;  Location[facility];  Service: General;  Laterality: Right;   COLONOSCOPY N/A 11/15/2012   Procedure: COLONOSCOPY;  Surgeon: Princella CHRISTELLA Nida, MD;  Location: [facility];  Service: Endoscopy;  Laterality: N/A;   CRYOTHERAPY  05/11/2021   Procedure: CRYOTHERAPY;  Surgeon: Brenna Adine CROME, DO;  Location: [facility];  Service: Pulmonary;;   DENTAL RESTORATION/EXTRACTION WITH X-RAY     ESOPHAGEAL DILATION N/A 09/09/2019   Procedure: ESOPHAGEAL DILATION;  Surgeon: Jesus Oliphant, MD;  Location: [facility];  Service: ENT;  Laterality: N/A;   ESOPHAGEAL DILATION N/A 10/05/2019   Procedure: ESOPHAGEAL DILATION;  Surgeon: Jesus Oliphant, MD;  Location: [facility];  Service: ENT;  Laterality: N/A;  via tracheostomy   ESOPHAGEAL  DILATION N/A 02/27/2020   Procedure: ESOPHAGEAL DILATION;  Surgeon: Jesus Oliphant, MD;  Location: [facility];  Service: ENT;  Laterality: N/A;   ESOPHAGOGASTRODUODENOSCOPY (EGD) WITH PROPOFOL  N/A 08/12/2018   Procedure: ESOPHAGOGASTRODUODENOSCOPY (EGD) WITH PROPOFOL ;  Surgeon: Legrand Victory LITTIE DOUGLAS, MD;  Location: [facility] ENDOSCOPY;  Service: Gastroenterology;  Laterality: N/A;   ESOPHAGOSCOPY  06/21/2012   Procedure: ESOPHAGOSCOPY;  Surgeon: Oliphant Jesus, MD;  Location: [facility];  Service: ENT;  Laterality: N/A;   ESOPHAGOSCOPY N/A 06/24/2021    Procedure: ESOPHAGOSCOPY WITH DILATATION;  Surgeon: Jesus Oliphant, MD;  Location: [facility];  Service: ENT;  Laterality: N/A;   ESOPHAGOSCOPY N/A 02/14/2022   Procedure: ESOPHAGOSCOPY WITH DILATATION;  Surgeon: Jesus Oliphant, MD;  Location: [facility] OR;  Service: ENT;  Laterality: N/A;   ESOPHAGOSCOPY N/A 08/11/2022   Procedure: ESOPHAGOSCOPY WITH DILATATION;  Surgeon: Jesus Oliphant, MD;  Location: [facility] OR;  Service: ENT;  Laterality: N/A;   ESOPHAGOSCOPY WITH DILITATION N/A 09/21/2014   Procedure: ESOPHAGOSCOPY WITH DILITATION;  Surgeon: Oliphant Jesus, MD;  Location: [facility] OR;  Service: ENT;  Laterality: N/A;   ESOPHAGOSCOPY WITH DILITATION N/A 07/04/2016   Procedure: ESOPHAGOSCOPY WITH DILITATION;  Surgeon: Oliphant Jesus, MD;  Location: [facility] OR;  Service: ENT;  Laterality: N/A;   ESOPHAGOSCOPY WITH DILITATION N/A 12/01/2017   Procedure: ESOPHAGOSCOPY WITH DILITATION;  Surgeon: Jesus Oliphant, MD;  Location: [facility] OR;  Service: ENT;  Laterality: N/A;   ESOPHAGOSCOPY WITH DILITATION N/A 04/19/2018   Procedure: ESOPHAGOSCOPY WITH DILITATION;  Surgeon: Jesus Oliphant, MD;  Location: [facility] OR;  Service: ENT;  Laterality: N/A;   ESOPHAGOSCOPY WITH DILITATION N/A 03/07/2019   Procedure: Esophagoscopy with dilatation;  Surgeon: Jesus Oliphant, MD;  Location: [facility];  Service: ENT;  Laterality: N/A;   ESOPHAGOSCOPY WITH DILITATION N/A 07/16/2020   Procedure: ESOPHAGOSCOPY WITH DILATION;  Surgeon: Jesus Oliphant, MD;  Location: [facility];  Service: ENT;  Laterality: N/A;   ESOPHAGOSCOPY WITH DILITATION N/A 10/08/2020   Procedure: ESOPHAGOSCOPY With Dilation;  Surgeon: Jesus Oliphant, MD;  Location: [facility];  Service: ENT;  Laterality: N/A;  21 French to 88 french   ESOPHAGOSCOPY WITH DILITATION N/A 08/05/2023   Procedure: ESOPHAGOSCOPY WITH DILITATION;  Surgeon: Jesus Oliphant, MD;  Location: [facility] OR;  Service: ENT;  Laterality: N/A;   FLEXIBLE BRONCHOSCOPY  01/08/2018       FOREIGN  BODY REMOVAL  05/11/2021   Procedure: FOREIGN BODY REMOVAL;  Surgeon: Brenna Adine LITTIE, DO;  [facility];  Service: Pulmonary;;   FOREIGN BODY REMOVAL BRONCHIAL  10/02/2011   Procedure: REMOVAL FOREIGN BODY BRONCHIAL;  Surgeon: Merilee Kraft, MD;  Location: [facility] OR;  Service: ENT;  Laterality: N/A;   FOREIGN BODY REMOVAL BRONCHIAL N/A 01/08/2018   Procedure: REMOVAL FOREIGN BODY BRONCHIAL;  Surgeon: Arlana Arnt, MD;  [facility];  Service: ENT;  Laterality: N/A;   HEMOSTASIS CONTROL  05/11/2021   Procedure: HEMOSTASIS CONTROL;  Surgeon: Brenna Adine LITTIE, DO;  [facility];  Service: Pulmonary;;   LARYNGECTOMY     Porta cath removal     PORTACATH PLACEMENT  09/17/10   Tip in cavoatrial junction   RE-EXCISION OF BREAST CANCER,SUPERIOR MARGINS Right 07/29/2018   Procedure: RE-EXCISION OF RIGHT BREAST MEDIAL MARGINS;  Surgeon: Ebbie Cough, MD;  Location: [facility] OR;  Service: General;  Laterality: Right;   RIGID ESOPHAGOSCOPY N/A 03/19/2019   Procedure: FLEXIBLE ESOPHAGOSCOPY;  Surgeon: Arlana Arnt, MD;  Location: [facility] OR;  Service:  ENT;  Laterality: N/A;   STOMAPLASTY N/A 10/21/2016   Procedure: LINNA;  Surgeon: Jesus Oliphant, MD;  Location: [facility] OR;  Service: ENT;  Laterality: N/A;   TOTAL KNEE ARTHROPLASTY Right 12/12/2019   Procedure: RIGHT TOTAL KNEE ARTHROPLASTY;  Surgeon: Jerri Kay HERO, MD;  [facility];  Service: Orthopedics;  Laterality: Right;   TOTAL KNEE ARTHROPLASTY Left 04/01/2021   Procedure: LEFT TOTAL KNEE ARTHROPLASTY;  Surgeon: Jerri Kay HERO, MD;  [facility];  Service: Orthopedics;  Laterality: Left;   TRACHEAL DILITATION  07/16/2011   Procedure: TRACHEAL DILITATION;  Surgeon: Oliphant VEAR Jesus, MD;  [facility];  Service: ENT;  Laterality: N/A;  dilation of tracheal stoma and replacement of stoma tube   TRACHEAL ESOPHAGEAL PROSTHESIS (TEP) CHANGE N/A 08/11/2022   Procedure: TRACHEAL ESOPHAGEAL PROSTHESIS (TEP) CHANGE;  Surgeon: Jesus Oliphant,  MD;  Location: [facility] OR;  Service: ENT;  Laterality: N/A;  ATOS TEP SET   TUBAL LIGATION  1982   VIDEO BRONCHOSCOPY Bilateral 05/11/2021   Procedure: VIDEO BRONCHOSCOPY WITHOUT FLUORO;  Surgeon: Brenna Adine CROME, DO;  [facility];  Service: Pulmonary;  Laterality: Bilateral;  w/ cryotherapy, Foreign body extraction     Social History:   reports that she quit smoking about 13 years ago. Her smoking use included cigarettes. She started smoking about 33 years ago. She has a 40 pack-year smoking history. She has never used smokeless tobacco. She reports current alcohol use. She reports that she does not use drugs.   Family History:  Her family history includes Cancer in her brother and sister; Diabetes in her sister; Heart disease in her father, mother, and sister; Liver disease in her maternal grandmother. There is no history of Breast cancer, Colon cancer, Esophageal cancer, Rectal cancer, Stomach cancer, or Colon polyps.   Allergies Allergies  Allergen Reactions   Lisinopril  Cough     Home Medications  Prior to Admission medications   Medication Sig Start Date End Date Taking? Authorizing Provider  budesonide  (PULMICORT ) 0.5 MG/2ML nebulizer solution Take 2 mLs (0.5 mg total) by nebulization daily. 09/17/23   Cleotilde Lukes, DO  buPROPion  (WELLBUTRIN  XL) 150 MG 24 hr tablet TAKE 1 TABLET BY MOUTH DAILY 10/28/23   Cleotilde Lukes, DO  carvedilol  (COREG ) 12.5 MG tablet Take 1 tablet (12.5 mg total) by mouth 2 (two) times daily with a meal. 08/13/23   Cleotilde Lukes, DO  Continuous Glucose Receiver (DEXCOM G7 RECEIVER) DEVI Apply 1 each topically See admin instructions. 12/02/23   Cleotilde Lukes, DO  Continuous Glucose Sensor (DEXCOM G7 SENSOR) MISC APPLY NEW SENSOR EVERY 10 DAYS 11/12/23   Cleotilde Lukes, DO  EQ ALLERGY RELIEF, CETIRIZINE , 10 MG tablet Take 1 tablet by mouth once daily 09/11/23   Cleotilde Lukes, DO  escitalopram  (LEXAPRO ) 20 MG tablet Take 1 tablet (20 mg total)  by mouth daily. 03/05/23   McDiarmid, Krystal BIRCH, MD  fluticasone  (FLONASE ) 50 MCG/ACT nasal spray Place 1 spray into both nostrils 2 (two) times daily. 02/01/24   Cleotilde Lukes, DO  formoterol  (PERFOROMIST ) 20 MCG/2ML nebulizer solution Take 2 mLs (20 mcg total) by nebulization 2 (two) times daily. 09/17/23   Cleotilde Lukes, DO  Humidifiers MISC 1 application by Tracheal Tube route as needed. 02/05/21   Austin Ade, MD  insulin  lispro (HUMALOG  KWIKPEN) 100 UNIT/ML KwikPen Inject 10 Units into the skin 3 (three) times daily. 10/23/23   McDiarmid, Krystal BIRCH, MD  Insulin  Pen Needle (BD PEN NEEDLE NANO 2ND GEN) 32G X 4  MM MISC Inject 1 each into the skin 4 (four) times daily. 10/26/23   Cleotilde Lukes, DO  LANTUS  SOLOSTAR 100 UNIT/ML Solostar Pen INJECT SUBCUTANEOUSLY 35 UNITS  DAILY 02/08/24   Cleotilde Lukes, DO  levothyroxine  (SYNTHROID ) 200 MCG tablet TAKE 1 TABLET BY MOUTH DAILY  BEFORE BREAKFAST 11/10/23   Cleotilde Lukes, DO  losartan  (COZAAR ) 50 MG tablet Take 1 tablet (50 mg total) by mouth daily. 11/17/23   McDiarmid, Krystal BIRCH, MD  metFORMIN  (GLUCOPHAGE -XR) 500 MG 24 hr tablet TAKE 1 TABLET BY MOUTH DAILY  WITH BREAKFAST 09/28/23   Cleotilde Lukes, DO  montelukast  (SINGULAIR ) 10 MG tablet TAKE 1 TABLET BY MOUTH AT BEDTIME 09/11/23   Miller, Samantha, DO  nystatin  cream (MYCOSTATIN ) Apply 1 Application topically daily as needed (rash under breast stomach).    [provider]  ondansetron  (ZOFRAN ) 4 MG tablet Take 1 tablet (4 mg total) by mouth every 8 (eight) hours as needed for nausea or vomiting. 02/01/24   Cleotilde Lukes, DO  predniSONE  (DELTASONE ) 20 MG tablet Take 2 tablets (40 mg total) by mouth daily. 02/21/24   Doretha Folks, MD  revefenacin  (YUPELRI ) 175 MCG/3ML nebulizer solution Take 3 mLs (175 mcg total) by nebulization daily. 12/30/23   Kara Dorn NOVAK, MD  rosuvastatin  (CRESTOR ) 40 MG tablet TAKE 1 TABLET BY MOUTH DAILY 01/05/24   Cleotilde Lukes, DO  Semaglutide ,0.25 or  0.5MG /DOS, 2 MG/3ML SOPN Inject 0.5 mg into the skin once a week. 01/25/24   McDiarmid, Krystal BIRCH, MD  sodium chloride  HYPERTONIC 3 % nebulizer solution Take by nebulization 3 (three) times daily. 05/22/23   Kara Dorn NOVAK, MD     Critical care time: 

## 2024-02-22 NOTE — Progress Notes (Signed)
   02/22/24 1619  Oxygen  Therapy/Pulse Ox  O2 Flow Rate (L/min) (S)  8 L/min  FiO2 (%) (S)  30 %  SpO2 93 %   Weaned down O2 per RN verbal from MD.

## 2024-02-22 NOTE — H&P (Cosign Needed Addendum)
 Hospital Admission History and Physical Service Pager: [phone]  Patient name: Deborah Deborah [name] Medical record number: [id number] Date of Birth: 03-13-1956 Age: 68 y.o. Gender: female  Primary Care Provider: Cleotilde Lukes, DO Consultants: None  Code Status: Full Code Preferred Emergency Contact:  Contact Information     Name Relation Home Work [place] Daughter [phone]     Deborah [name],Deborah Deborah [name]   [phone]    Chief Complaint: Deborah [name]  Differential and Medical Decision Making:  Deborah Deborah [name] is a 68 y.o. female presenting with Deborah [name] and known COVID diagnosis from UC. Differential for this patient's presentation of this includes AHRF 2/2 COVID (most likely secondary to COVID), COVID PNA (possible with increasing oxygen  requirement w/ no hx of home O2, COVID diagnosis, and CXR findings, has remained afebrile and no focal findings on exam or imaging), COPD exacerbation (possible given known history with current viral infection and increased dyspnea, however less likely given she does not meet criteria with no sputum production or ventilator support), and PE (unlikely with Well's score of 1.5 for tachycardia, will defer D-dimer or CTPE at this time d/t likely source for tachypnea and tachycardia being COVID). Assessment & Plan Acute hypoxic respiratory failure (HCC) COVID Patient afebrile, but continues to be tachypneic and tachycardic.  COVID-positive at [facility] 9/7.  New oxygen  requirement 12 L nebulized O2 via tracheostomy stoma. CXR showed scattered interstitial and ground glass opacities consistent with atypical pneumonia, compatible given COVID-positive and chronic LLL atelectasis.  S/p albuterol  nebulizer and methylprednisolone  in the ED. - Admit to FMTS, attending Dr. Madelon - Progressive, Vital signs per floor - Tylenol  650 mg as needed every 6 hours - Dexamethasone  6 mg x 10 days per guidelines, to start 9/9 - Discussed care with  pulmonologist, Dr. Harlene [name], d/t escalating oxygen  requirement and concern for acute decompensation w/ her hx of COPD, asthma, and tracheostomy stoma and possibly requiring Tocilizumab per hospital guidelines which requires ICU care; PCCM to assess - Regular diet   - Zofran -ODT 4 mg Q8HR PRN  - Wean O2 as tolerated, goal of 88-92% due to COPD history  - PT/OT to treat - VTE prophylaxis: Lovenox  40 mg - AM BMP Chronic health problem Anxiety - continue home Lexapro  20 mg, bupropion  24-hour 150 mg HTN - continue home losartan  50 mg, carvedilol  12.5 mg 2 times daily Asthma - continue home montelukast  10 mg HLD - continue home rosuvastatin  40 mg T2DM - Resistant SSI, will add back Lantus  based on how she is tolerating her diet and sugars COPD - continue home budesonide , formoterol , and yupelri  nebulizers  FEN/GI: Regular Diet VTE Prophylaxis: Lovenox   Disposition: Med-Surg  History of Present Illness:  Deborah Deborah [name] is a 68 y.o. female presenting with Deborah [name] diagnosis at [facility]. Started feeling ill on Saturday and it escalated by Sunday to the point she had to be seen. She was diagnosed with COVID at the [facility] and was sent to the ED for further workup and oxygen . She was advised admission to the hospital yesterday, but she felt better so she wanted to leave.   She got worried again today since she checked her O2 saturation at home and it was low. Normally runs 90-95%, she doesn't use oxygen  at home.   Endorses HA, CP (primarily with the coughing, tight, and feels like someone has a hammer to her chest, but not constant), Deborah [name], nausea, chronic cough w/o mucus, and vomiting x1. Denies fevers, chills, or diarrhea.  In the ED, patient remained afebrile, was tachypneic, tachycardic, and hypertensive.  Was put on 12L.  Lowest oxygenation saturation was 87%.  CBC WNL.  CMP with mild hypokalemia, creatinine elevated to 1.13, and GFR 54.  Tropes negative.  BNP within normal limits.  CXR  demonstrated scattered interstitial and ground glass opacities consistent with atypical pneumonia compatible with COVID diagnosis and chronic left lower lobe atelectasis.  Received albuterol  nebulizer and methylprednisone.  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   Pertinent Past Surgical History: Breast lumpectomy 2020 EGD with dilation multiple Laryngectomy Stoma plasty 2018 Total knee arthroplasty - right 2021, left 2022 Tubal ligation 1982  Remainder reviewed in history tab.   Pertinent Social History: Tobacco use: Former, quit in 2010, smoked 20 years prior and about 2 packs per day Alcohol use: 1-2 beers a day or in a week, depending on how she feels Other Substance use: None Lives with husband and sister  Pertinent Family History: Mother-heart disease Father-heart disease Sister-heart disease  Important Outpatient Medications: *took all her medications today* (except her 2nd dose of Carvedilol ) Budesonide  Bupropion  150 mg Carvedilol  12.5 mg twice daily Cetrizine 10 mg  Escitalopram  20 mg Fluticasone  50 mg Insulin  lispro 10 units 3 times daily Lantus  35 units daily Levothyroxine  200 mcg Losartan  50 mg Metformin  500 mg 24-hour tablet Montelukast  10 mg Prednisone  20 mg - not taking Rosuvastatin  40 mg Semaglutide  0.5 mg weekly   Objective: BP (!) 167/77   Pulse (!) 108   Temp 98.2 F (36.8 C) (Oral)   Resp (!) 22   SpO2 93%  Exam: General: NAD Eyes: Nonicteric, EOM grossly intact Neck: Tracheal stoma present, nonerythematous Cardiovascular: Tachycardic regular rhythm, no M/R/G Respiratory: Diffuse coarse breath sounds, normal work of breathing on 12 L nebulized O2 via tracheostomy collar Gastrointestinal: Soft,  nondistended, nontender to palpation MSK: Moves all limbs grossly equally Neuro: No gross focal deficits Psych: Mood and affect appropriate  Labs:  CBC BMET  Recent Labs  Lab 02/22/24 1532  WBC 8.0  HGB 12.7  HCT 40.4  PLT 325   Recent Labs  Lab 02/22/24 1532  [name] 134*  K 4.7  CL 96*  CO2 25  BUN 16  CREATININE 1.13*  GLUCOSE 317*  CALCIUM  8.8*    COVID positive Trops: 10 > 8  BNP: 32  EKG: My own interpretation: Sinus tachycardia, no ST elevations or depressions, nonspecific ST changes, no right or left bundle-branch blocks   Imaging Studies Performed:  CXR 1. Scattered interstitial and ground-glass opacities consistent with atypical pneumonia, compatible with given history of COVID positive. 2. Chronic left lower lobe atelectasis.  My Interpretation: No focal consolidations, no pneumothorax, LLL atelectasis.   Lennie Raguel MATSU, DO 02/22/2024, 5:19 PM PGY-1, [place] [facility]  FPTS Intern pager: [phone], text pages welcome Secure chat group [facility] Teaching Service   I have discussed the above with Dr. Lennie and agree with the documented plan. My edits for correction/addition/clarification are included above. Please see any attending notes.   Kathrine Melena, DO PGY-2, [place] [facility] 02/22/2024 8:07 PM

## 2024-02-22 NOTE — Assessment & Plan Note (Addendum)
 Patient afebrile, but continues to be tachypneic and tachycardic.  COVID-positive at [facility] 9/7.  New oxygen  requirement 12 L nebulized O2 via tracheostomy stoma. CXR showed scattered interstitial and ground glass opacities consistent with atypical pneumonia, compatible given COVID-positive and chronic LLL atelectasis.  S/p albuterol  nebulizer and methylprednisolone  in the ED. - Admit to FMTS, attending Dr. Madelon - Progressive, Vital signs per floor - Tylenol  650 mg as needed every 6 hours - Dexamethasone  6 mg x 10 days per guidelines, to start 9/9 - Discussed care with pulmonologist, Dr. Harlene Na, d/t escalating oxygen  requirement and concern for acute decompensation w/ her hx of COPD, asthma, and tracheostomy stoma and possibly requiring Tocilizumab per hospital guidelines which requires ICU care; PCCM to assess - Regular diet   - Zofran -ODT 4 mg Q8HR PRN  - Wean O2 as tolerated, goal of 88-92% due to COPD history  - PT/OT to treat - VTE prophylaxis: Lovenox  40 mg - AM BMP

## 2024-02-23 ENCOUNTER — Other Ambulatory Visit: Payer: Self-pay

## 2024-02-23 DIAGNOSIS — J9601 Acute respiratory failure with hypoxia: Secondary | ICD-10-CM

## 2024-02-23 DIAGNOSIS — E875 Hyperkalemia: Secondary | ICD-10-CM

## 2024-02-23 DIAGNOSIS — U071 COVID-19: Secondary | ICD-10-CM | POA: Diagnosis not present

## 2024-02-23 DIAGNOSIS — J1282 Pneumonia due to coronavirus disease 2019: Secondary | ICD-10-CM | POA: Diagnosis not present

## 2024-02-23 DIAGNOSIS — G9341 Metabolic encephalopathy: Secondary | ICD-10-CM

## 2024-02-23 LAB — BASIC METABOLIC PANEL WITH GFR
Anion gap: 9 (ref 5–15)
Anion gap: 10 (ref 5–15)
BUN: 18 mg/dL (ref 8–23)
BUN: 21 mg/dL (ref 8–23)
CO2: 26 mmol/L (ref 22–32)
CO2: 27 mmol/L (ref 22–32)
Calcium: 8.8 mg/dL — ABNORMAL LOW (ref 8.9–10.3)
Calcium: 9.2 mg/dL (ref 8.9–10.3)
Chloride: 97 mmol/L — ABNORMAL LOW (ref 98–111)
Chloride: 96 mmol/L — ABNORMAL LOW (ref 98–111)
Creatinine, Ser: 1.1 mg/dL — ABNORMAL HIGH (ref 0.44–1.00)
Creatinine, Ser: 1.27 mg/dL — ABNORMAL HIGH (ref 0.44–1.00)
GFR, Estimated: 55 mL/min — ABNORMAL LOW (ref >60)
GFR, Estimated: 47 mL/min — ABNORMAL LOW (ref >60)
Glucose, Bld: 305 mg/dL — ABNORMAL HIGH (ref 70–99)
Glucose, Bld: 232 mg/dL — ABNORMAL HIGH (ref 70–99)
Potassium: 5.8 mmol/L — ABNORMAL HIGH (ref 3.5–5.1)
Potassium: 4.2 mmol/L (ref 3.5–5.1)
Sodium: 132 mmol/L — ABNORMAL LOW (ref 135–145)
Sodium: 133 mmol/L — ABNORMAL LOW (ref 135–145)

## 2024-02-23 LAB — GLUCOSE, CAPILLARY
Glucose-Capillary: 158 mg/dL — ABNORMAL HIGH (ref 70–99)
Glucose-Capillary: 183 mg/dL — ABNORMAL HIGH (ref 70–99)
Glucose-Capillary: 245 mg/dL — ABNORMAL HIGH (ref 70–99)
Glucose-Capillary: 259 mg/dL — ABNORMAL HIGH (ref 70–99)
Glucose-Capillary: 311 mg/dL — ABNORMAL HIGH (ref 70–99)

## 2024-02-23 LAB — HIV ANTIBODY (ROUTINE TESTING W REFLEX): HIV Screen 4th Generation wRfx: NONREACTIVE

## 2024-02-23 MED ORDER — DEXTROSE 50 % IV SOLN
INTRAVENOUS | Status: AC
  Administered 2024-02-23: 50 mL
  Filled 2024-02-23: qty 50

## 2024-02-23 MED ORDER — ENOXAPARIN SODIUM 60 MG/0.6ML IJ SOSY
50.0000 mg | PREFILLED_SYRINGE | Freq: Every day | INTRAMUSCULAR | Status: DC
  Administered 2024-02-23 – 2024-02-28 (×6): 50 mg via SUBCUTANEOUS
  Filled 2024-02-23 (×6): qty 0.6

## 2024-02-23 MED ORDER — ALUM & MAG HYDROXIDE-SIMETH 200-200-20 MG/5ML PO SUSP
15.0000 mL | Freq: Four times a day (QID) | ORAL | Status: DC | PRN
  Administered 2024-02-23: 15 mL via ORAL
  Filled 2024-02-23 (×2): qty 30

## 2024-02-23 MED ORDER — INSULIN GLARGINE 100 UNIT/ML ~~LOC~~ SOLN
35.0000 [IU] | Freq: Every day | SUBCUTANEOUS | Status: DC
  Administered 2024-02-23 – 2024-02-28 (×6): 35 [IU] via SUBCUTANEOUS
  Filled 2024-02-23 (×6): qty 0.35

## 2024-02-23 MED ORDER — HYDRALAZINE HCL 20 MG/ML IJ SOLN: 10.0000 mg | INTRAMUSCULAR | Status: DC | PRN

## 2024-02-23 MED ORDER — ENOXAPARIN SODIUM 60 MG/0.6ML IJ SOSY: 50.0000 mg | PREFILLED_SYRINGE | INTRAMUSCULAR | Status: DC

## 2024-02-23 MED ORDER — SODIUM BICARBONATE 8.4 % IV SOLN
50.0000 meq | Freq: Once | INTRAVENOUS | Status: AC
  Administered 2024-02-23: 50 meq via INTRAVENOUS
  Filled 2024-02-23: qty 50

## 2024-02-23 MED ORDER — INSULIN ASPART 100 UNIT/ML IV SOLN
10.0000 [IU] | Freq: Once | INTRAVENOUS | Status: AC
  Administered 2024-02-23: 10 [IU] via INTRAVENOUS

## 2024-02-23 MED ORDER — SODIUM CHLORIDE 3 % IN NEBU
4.0000 mL | INHALATION_SOLUTION | Freq: Three times a day (TID) | RESPIRATORY_TRACT | Status: DC
  Administered 2024-02-23 – 2024-02-26 (×9): 4 mL via RESPIRATORY_TRACT
  Filled 2024-02-23 (×6): qty 4
  Filled 2024-02-23: qty 15
  Filled 2024-02-23 (×3): qty 4

## 2024-02-23 MED ORDER — CALCIUM GLUCONATE-NACL 1-0.675 GM/50ML-% IV SOLN
1.0000 g | Freq: Once | INTRAVENOUS | Status: AC
  Administered 2024-02-23: 1000 mg via INTRAVENOUS
  Filled 2024-02-23: qty 50

## 2024-02-23 MED ORDER — CHLORHEXIDINE GLUCONATE CLOTH 2 % EX PADS
6.0000 | MEDICATED_PAD | Freq: Every day | CUTANEOUS | Status: DC
  Administered 2024-02-23 – 2024-02-27 (×5): 6 via TOPICAL

## 2024-02-23 MED ORDER — DEXMEDETOMIDINE HCL IN NACL 400 MCG/100ML IV SOLN
INTRAVENOUS | Status: AC
  Filled 2024-02-23: qty 100

## 2024-02-23 MED ORDER — SODIUM CHLORIDE 0.9% FLUSH
10.0000 mL | Freq: Two times a day (BID) | INTRAVENOUS | Status: DC
  Administered 2024-02-23: 20 mL
  Administered 2024-02-23 – 2024-02-24 (×3): 10 mL

## 2024-02-23 MED ORDER — SODIUM CHLORIDE 0.9% FLUSH: 10.0000 mL | INTRAVENOUS | Status: DC | PRN

## 2024-02-23 MED ORDER — LORAZEPAM 2 MG/ML IJ SOLN
2.0000 mg | Freq: Once | INTRAMUSCULAR | Status: AC
Start: 2024-02-23 — End: 2024-02-23
  Administered 2024-02-23: 2 mg via INTRAVENOUS
  Filled 2024-02-23: qty 1

## 2024-02-23 MED ORDER — DEXMEDETOMIDINE HCL IN NACL 400 MCG/100ML IV SOLN
0.0000 ug/kg/h | INTRAVENOUS | Status: DC
  Administered 2024-02-23: 0.5 ug/kg/h via INTRAVENOUS
  Filled 2024-02-23: qty 100

## 2024-02-23 MED ORDER — LABETALOL HCL 5 MG/ML IV SOLN
10.0000 mg | INTRAVENOUS | Status: DC | PRN
  Administered 2024-02-23: 10 mg via INTRAVENOUS

## 2024-02-23 MED ORDER — SODIUM CHLORIDE 3 % IN NEBU: 4.0000 mL | INHALATION_SOLUTION | Freq: Three times a day (TID) | RESPIRATORY_TRACT | Status: DC

## 2024-02-23 MED ORDER — LABETALOL HCL 5 MG/ML IV SOLN
INTRAVENOUS | Status: AC
  Filled 2024-02-23: qty 4

## 2024-02-23 NOTE — TOC Initial Note (Signed)
 Transition of Care [facility]) - Initial/Assessment Note    Patient Details  Name: Deborah [name] MRN: [id number] Date of Birth: [birth year]  Transition of Care [facility]) CM/SW Contact:    Lauraine FORBES Saa, LCSWA Phone Number: 02/23/2024, 1:37 PM  Clinical Narrative:                  1:37 PM Per chart review, patient resides at home with spouse. Patient has a PCP and insurance. Patient does not have SNF history. Patient has HH history with [facility]. Patient has DME (humidifier for trach, rolling walker, BSC, CMP) history with [facility], [facility] , and [facility]. Patient's preferred pharmacy's are [facility] and [facility] [place]. Physical therapy recommended patient discharge with HH. TOC will continue to follow and be available to assist.  Expected Discharge Plan: Home w Home Health Services Barriers to Discharge: Continued Medical Work up   Patient Goals and CMS Choice            Expected Discharge Plan and Services   Discharge Planning Services: CM Consult Post Acute Care Choice: Home Health Living arrangements for the past 2 months: Single Family Home                                      Prior Living Arrangements/Services Living arrangements for the past 2 months: Single Family Home Lives with:: Spouse Patient language and need for interpreter reviewed:: Yes            Current home services: DME Criminal Activity/Legal Involvement Pertinent to Current Situation/Hospitalization: No - Comment as needed  Activities of Daily Living   ADL Screening (condition at time of admission) Independently performs ADLs?: Yes (appropriate for developmental age) Is the patient deaf or have difficulty hearing?: No Does the patient have difficulty seeing, even when wearing glasses/contacts?: No Does the patient have difficulty concentrating, remembering, or making decisions?: No  Permission Sought/Granted Permission sought to share information with  : Family Supports, Oceanographer granted to share information with : No (Contact information on chart)  Share Information with NAME: Beverlie Kurihara  Permission granted to share info w AGENCY: HH  Permission granted to share info w Relationship: Daughter  Permission granted to share info w Contact Information: [phone]  Emotional Assessment       Orientation: : Oriented to Self, Oriented to Place, Oriented to  Time, Oriented to Situation Alcohol / Substance Use: Not Applicable Psych Involvement: No (comment)  Admission diagnosis:  Acute hypoxemic respiratory failure due to COVID-19 (HCC) [U07.1, J96.01] Acute hypoxic respiratory failure (HCC) [J96.01] Patient Active Problem List   Diagnosis Date Noted   Acute hypoxic respiratory failure (HCC) 02/22/2024   Chronic health problem 02/22/2024   COVID 02/22/2024   Hyperlipidemia associated with type 2 diabetes mellitus (HCC) 01/25/2024   Obesity, morbid (HCC) 10/22/2023   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   Peripheral polyneuropathy 12/12/2020   Allergic rhinitis 05/20/2019   GERD (gastroesophageal reflux disease)    COPD (chronic obstructive pulmonary disease) (HCC)    Breast cancer (HCC) 07/06/2018   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 [place] [address] Phone: [phone] Fax: [phone]  [facility] [address] [place]  ([place]), [place] - [address] [place] ([place]) [place] [address] Phone: [phone] Fax: [phone]     Social Drivers of Health (SDOH) Social History: SDOH Screenings   Food Insecurity: Low Risk  (08/19/2022)   Received from [facility]  Housing: Low Risk  (08/19/2022)   Received from [facility]  Transportation Needs: No Transportation Needs (08/19/2022)   Received from [facility]  Utilities: Low Risk  (08/19/2022)   Received from [facility]  Depression (PHQ2-9): Low Risk  (11/24/2022)  Tobacco Use: Medium Risk (02/22/2024)   SDOH Interventions:     Readmission Risk Interventions     No data to display

## 2024-02-23 NOTE — Progress Notes (Signed)

## 2024-02-23 NOTE — Progress Notes (Signed)
 eLink Physician-Brief Progress Note Patient Name: Deborah [name] DOB: 12/13/1955 MRN: [id number]   Date of Service  02/23/2024  HPI/Events of Note  Notified of K 5.8 Patient was made NPO  eICU Interventions  Ordered hyperkalemia protocol     Intervention Category Intermediate Interventions: Electrolyte abnormality - evaluation and management  Damien ONEIDA Grout 02/23/2024, 5:06 AM

## 2024-02-23 NOTE — Progress Notes (Addendum)
 NAME:  Deborah [name], MRN:  [id number], DOB:  05-22-1956, LOS: 1 ADMISSION DATE:  02/22/2024, CONSULTATION DATE:  02/22/24 REFERRING MD:  Family medicine, CHIEF COMPLAINT:  covid   History of Present Illness:  68 yo female presented after refusing admission yesterday with positive covid result and hypoxia at urgent care. Pt then called EMS services today was again noted to be hypoxic, infiltrates on imaging. Complaints of shortness of breath and cough. Denies sputum production. No f/c no n/v/d. Denies chest pain. States she feels fine just notes her oxygen  is low and she gets sob with a lot of mobility. No other associated symptoms  Pt reportedly had symptoms for 3 days prior to presentation to urgent care. 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 Pt states that she takes nebulizers at home thru her stoma which is the route she has currently been utilizing her nebs during this acute illness. She endorses taking her usually scheduled medications.    CCM was asked to eval per family medicine attending based on reported hospital protocol.    Pt was noted to be setting up on edge of bed eating a full dinner tray oxygen  with trach collar in place via stoma blowing by 11L.  She is in NO acute distress. Able to communicate without dyspnea, no accessory muscle use. When stoma is occluded speaks in full sentences without any difficulty. Provides history and also confirms her aversion to transfer to ICU when she feels fine.  Pertinent  Medical History  T2dm with hypglycemia Copd Htn Hyperlipidemia Anxiety Breast cancer (R) Hypothyroidism HFpEF (2024 LVEF 70%)  Significant Hospital Events: Including procedures, antibiotic start and stop dates in addition to other pertinent events   Presented to urgent care 9/7 (refused hospital eval and admission) Admitted to hospital 9/8  Interim History / Subjective:  Critically ill, on Precedex  since overnight On trach collar,  50%  Objective    Blood pressure (!) 160/63, pulse 69, temperature (!) 94 F (34.4 C), temperature source Axillary, resp. rate 15, weight 109.6 kg, SpO2 93%.    FiO2 (%):  [30 %-100 %] 30 %   Intake/Output Summary (Last 24 hours) at 02/23/2024 1048 Last data filed at 02/23/2024 0900 Gross per 24 hour  Intake 126.21 ml  Output --  Net 126.21 ml   Filed Weights   02/23/24 0110  Weight: 109.6 kg    Examination: General: nad elderly woman, supine, no distress HENT: ncat, eomi, perrla, mmmp, tracheal stoma with oxygen  tent Lungs: Clear breath sounds bilateral, no accessory muscle use Cardiovascular: S1-S2 regular Abdomen: soft, nt, nd bs + Extremities: no c/c/e Neuro: no focal deficits, awake, interactive GU: deferred  Resolved problem list   Assessment and Plan  Covid pneumonia Acute hypoxic resp failure Copd with acute exacerbation  -titrate oxygen  to sat >88% -Encourage self proning -Triple therapy nebs -Dexamethasone  per protocol  Hyperkalemia -treated with insulin  and bicarbonate Repeat potassium 4.2 Treat hyperglycemia   T2dm with uncontrolled hyperglycemia  -Increase Lantus  to home dose of 35 units SSI resistant scale Received additional coverage today  Acute metabolic encephalopathy -Taper Precedex  off as needed  Hypothyroidism H/o htn Hyperlipidemia HFpEF H/o breast cancer Ckd3a  - Resume home meds as able Hold losartan  for now Use hydralazine  as needed instead  Labs   CBC: Recent Labs  Lab 02/21/24 1321 02/21/24 1350 02/22/24 1532  WBC 6.2  --  8.0  NEUTROABS 4.2  --   --   HGB 12.5 13.3 12.7  HCT 39.0 39.0 40.4  MCV 92.9  --  94.8  PLT 278  --  325    Basic Metabolic Panel: Recent Labs  Lab 02/21/24 1350 02/22/24 1532 02/23/24 0237 02/23/24 0821  NA 140 134* 132* 133*  K 4.1 4.7 5.8* 4.2  CL 102 96* 97* 96*  CO2  --  25 26 27   GLUCOSE 73 317* 305* 232*  BUN 11 16 18 21   CREATININE 1.00 1.13* 1.10* 1.27*  CALCIUM   --   8.8* 8.8* 9.2   GFR: Estimated Creatinine Clearance: 55.6 mL/min (A) (by C-G formula based on SCr of 1.27 mg/dL (H)). Recent Labs  Lab 02/21/24 1321 02/22/24 1532  WBC 6.2 8.0    Liver Function Tests: No results for input(s): AST, ALT, ALKPHOS, BILITOT, PROT, ALBUMIN  in the last 168 hours. No results for input(s): LIPASE, AMYLASE in the last 168 hours. No results for input(s): AMMONIA in the last 168 hours.  ABG    Component Value Date/Time   PHART 7.445 08/02/2012 1959   PCO2ART 39.8 08/02/2012 1959   PO2ART 94.5 08/02/2012 1959   HCO3 21.3 05/23/2023 1523   TCO2 28 02/21/2024 1350   ACIDBASEDEF 2.0 05/23/2023 1523   O2SAT 97 05/23/2023 1523     Coagulation Profile: No results for input(s): INR, PROTIME in the last 168 hours.  Cardiac Enzymes: No results for input(s): CKTOTAL, CKMB, CKMBINDEX, TROPONINI in the last 168 hours.  HbA1C: HbA1c, POC (controlled diabetic range)  Date/Time Value Ref Range Status  01/25/2024 09:01 AM 6.5 0.0 - 7.0 % Final  10/08/2023 03:09 PM 10.5 (A) 0.0 - 7.0 % Final    CBG: Recent Labs  Lab 02/22/24 1539 02/22/24 2204 02/23/24 0045 02/23/24 0709  GLUCAP 281* 308* 311* 259*    My independent critical care time was 31 minutes.   Harden Staff MD. DE[facility]. [facility] Pager : [phone]  If no response to pager , please call [phone] until 7 pm After 7:00 pm call Elink  [phone]   02/23/2024

## 2024-02-23 NOTE — Evaluation (Signed)
 Physical Therapy Evaluation Patient Details Name: Deborah [name] MRN: [id number] DOB: 09/18/1955 Today's Date: 02/23/2024  History of Present Illness  Pt is 68 y/o female presenting on 9/8 with SOB due to COVID (noted presented to urgent care 9/7 but refused hospital admission).  Admitted with covid pneumonia. CXR with scattered interstitial and ground glass opacities.  PMH: COPD/asthma, laryngectomy, HTN, GERD, hypothyroidism, depression, breast cancer, DM2.  Clinical Impression  Patient presents with decreased mobility due to generalized weakness, decreased activity tolerance with decreased cardiopulmonary endurance.   Previously managing independently and working in [facility] care.  She needed CGA for transfers and min A for ambulation without device limited by SOB.  Feel she will benefit from skilled PT in the acute setting and from HHPT at d/c.      If plan is discharge home, recommend the following: A little help with walking and/or transfers;A little help with bathing/dressing/bathroom   Can travel by private vehicle        Equipment Recommendations None recommended by PT  Recommendations for Other Services       Functional Status Assessment Patient has had a recent decline in their functional status and demonstrates the ability to make significant improvements in function in a reasonable and predictable amount of time.     Precautions / Restrictions Precautions Precautions: Fall Precaution/Restrictions Comments: on trach/stoma mask/tent @ 40% for ambulation      Mobility  Bed Mobility Overal bed mobility: Needs Assistance             General bed mobility comments: on BSC upon PT entry, OT in the room    Transfers Overall transfer level: Needs assistance Equipment used: 1 person hand held assist Transfers: Sit to/from Stand Sit to Stand: Contact guard assist           General transfer comment: assist for balance, to support for fatigue, sat back on EOB prior  to ambulation after using BSC    Ambulation/Gait Ambulation/Gait assistance: Min assist Gait Distance (Feet): 70 Feet Assistive device: 1 person hand held assist, IV Pole Gait Pattern/deviations: Step-to pattern, Decreased stride length, Shuffle       General Gait Details: holding IV pole and other arm around PT for support, short shuffling steps then  Stairs            Wheelchair Mobility     Tilt Bed    Modified Rankin (Stroke Patients Only)       Balance Overall balance assessment: Needs assistance   Sitting balance-Leahy Scale: Good     Standing balance support: No upper extremity supported Standing balance-Leahy Scale: Fair Standing balance comment: static balance unsupported, needs UE support for ambulation                             Pertinent Vitals/Pain Pain Assessment Pain Assessment: Faces Faces Pain Scale: No hurt    Home Living Family/patient expects to be discharged to:: Private residence Living Arrangements: Spouse/significant other Available Help at Discharge: Family Type of Home: House Home Access: Stairs to enter Entrance Stairs-Rails: Right Entrance Stairs-Number of Steps: 3   Home Layout: One level Home Equipment: Agricultural consultant (2 wheels);Rollator (4 wheels) Additional Comments: (P) spouse works but Network engineer can come assist as needed    Prior Function Prior Level of Function : Independent/Modified Independent;Working/employed             Mobility Comments: not using device for ambulation previously, 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  ADLs Comments: (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     Extremity/Trunk Assessment   Upper Extremity Assessment Upper Extremity Assessment: Defer to OT evaluation    Lower Extremity Assessment Lower Extremity Assessment: Generalized weakness    Cervical / Trunk Assessment Cervical / Trunk Assessment: Normal  Communication   Communication Communication: Impaired Factors Affecting  Communication: Other (comment) (h/o laryngectomy with residual stoma, self occludes though still difficult to understand)    Cognition   Behavior During Therapy: WFL for tasks assessed/performed   PT - Cognitive impairments: No apparent impairments                         Following commands: Intact       Cueing       General Comments General comments (skin integrity, edema, etc.): VSS on 40% FiO2 via trach tent though pt felt dyspnea and limited distance    Exercises     Assessment/Plan    PT Assessment Patient needs continued PT services  PT Problem List Decreased activity tolerance;Decreased balance;Decreased mobility;Cardiopulmonary status limiting activity;Decreased knowledge of use of DME       PT Treatment Interventions DME instruction;Gait training;Functional mobility training;Therapeutic activities;Therapeutic exercise;Balance training;Patient/family education    PT Goals (Current goals can be found in the Care Plan section)  Acute Rehab PT Goals Patient Stated Goal: return to independent PT Goal Formulation: With patient/family Time For Goal Achievement: 03/08/24 Potential to Achieve Goals: Good    Frequency Min 2X/week     Co-evaluation               AM-PAC PT 6 Clicks Mobility  Outcome Measure Help needed turning from your back to your side while in a flat bed without using bedrails?: A Little Help needed moving from lying on your back to sitting on the side of a flat bed without using bedrails?: A Little Help needed moving to and from a bed to a chair (including a wheelchair)?: A Little Help needed standing up from a chair using your arms (e.g., wheelchair or bedside chair)?: A Little Help needed to walk in hospital room?: A Little Help needed climbing 3-5 steps with a railing? : Total 6 Click Score: 16    End of Session Equipment Utilized During Treatment: Gait belt;Oxygen  Activity Tolerance: Patient limited by fatigue Patient  left: in bed;with call bell/phone within reach;with nursing/sitter in room Nurse Communication: Mobility status PT Visit Diagnosis: Muscle weakness (generalized) (M62.81);Difficulty in walking, not elsewhere classified (R26.2)    Time: 1212-1228 PT Time Calculation (min) (ACUTE ONLY): 16 min   Charges:   PT Evaluation $PT Eval Moderate Complexity: 1 Mod   PT General Charges $$ ACUTE PT VISIT: 1 Visit         Micheline Portal, PT Acute Rehabilitation Services [phone] 02/23/2024   Montie Portal 02/23/2024, 1:20 PM

## 2024-02-23 NOTE — Evaluation (Signed)
 Occupational Therapy Evaluation Patient Details Name: Deborah [name] MRN: [id number] DOB: 1955-09-24 Today's Date: 02/23/2024   History of Present Illness   Pt is 68 y/o female presenting on 9/8 with SOB due to COVID (noted presented to urgent care 9/7 but refused hospital admission).  Admitted with covid pneumonia. CXR with scattered interstitial and ground glass opacities.  PMH: COPD/asthma, laryngectomy, HTN, GERD, hypothyroidism, depression, breast cancer, DM2.     Clinical Impressions PTA patient independent, driving and working (home health aide).  Admitted for above and presents with problem list below, including generalized weakness, decreased activity tolerance and impaired balance.  She requires supervision for bed mobility, contact guard for transfers to [facility], and up to min assist for Adls. She is on 8L at 30% during session and Spo2 maintained during session.  Based on performance today, pt will best benefit from continued OT services acutely and after dc at [facility] level to optimize independence and safety with ADLs, IADLs and mobility.      If plan is discharge home, recommend the following:   A little help with walking and/or transfers;A little help with bathing/dressing/bathroom;Assistance with cooking/housework;Assist for transportation;Help with stairs or ramp for entrance     Functional Status Assessment   Patient has had a recent decline in their functional status and demonstrates the ability to make significant improvements in function in a reasonable and predictable amount of time.     Equipment Recommendations   None recommended by OT     Recommendations for Other Services         Precautions/Restrictions   Precautions Precautions: Fall Recall of Precautions/Restrictions: Intact Precaution/Restrictions Comments: on trach/stoma mask/tent  Restrictions Weight Bearing Restrictions Per Provider Order: No     Mobility Bed Mobility Overal bed  mobility: Needs Assistance Bed Mobility: Supine to Sit     Supine to sit: Supervision     General bed mobility comments: line mgmt, no physical assist required    Transfers Overall transfer level: Needs assistance Equipment used: 1 person hand held assist Transfers: Sit to/from Stand Sit to Stand: Contact guard assist           General transfer comment: balance, unsteady      Balance Overall balance assessment: Needs assistance Sitting-balance support: No upper extremity supported, Feet supported Sitting balance-Leahy Scale: Fair     Standing balance support: No upper extremity supported, During functional activity, Single extremity supported Standing balance-Leahy Scale: Fair Standing balance comment: relies on UE support for mobility, able to engage in ADLs with 0-1 hand support with contact guard                           ADL either performed or assessed with clinical judgement   ADL Overall ADL's : Needs assistance/impaired     Grooming: Set up;Sitting           Upper Body Dressing : Set up;Sitting   Lower Body Dressing: Minimal assistance;Sit to/from stand   Toilet Transfer: Contact guard assist;Stand-pivot;BSC/3in1   Toileting- Clothing Manipulation and Hygiene: Contact guard assist;Sit to/from stand       Functional mobility during ADLs: Contact guard assist       Vision   Vision Assessment?: No apparent visual deficits     Perception         Praxis         Pertinent Vitals/Pain Pain Assessment Pain Assessment: No/denies pain     Extremity/Trunk Assessment Upper Extremity Assessment Upper  Extremity Assessment: Generalized weakness;Right hand dominant   Lower Extremity Assessment Lower Extremity Assessment: Defer to PT evaluation   Cervical / Trunk Assessment Cervical / Trunk Assessment: Normal   Communication Communication Communication: Impaired Factors Affecting Communication: Other (comment) (h/o laryngectomy  with residual stoma, self occludes though still difficult to understand)   Cognition Arousal: Alert Behavior During Therapy: WFL for tasks assessed/performed Cognition: No apparent impairments                               Following commands: Intact       Cueing  General Comments   Cueing Techniques: Verbal cues  VSS on 30% FiO2 8L during ADLs   Exercises     Shoulder Instructions      Home Living Family/patient expects to be discharged to:: Private residence Living Arrangements: Spouse/significant other Available Help at Discharge: Family Type of Home: House Home Access: Stairs to enter Secretary/administrator of Steps: 3 Entrance Stairs-Rails: Right Home Layout: One level     Bathroom Shower/Tub: Chief Strategy Officer: Standard     Home Equipment: Agricultural consultant (2 wheels);Rollator (4 wheels)   Additional Comments: spouse works but Network engineer can come assist as needed      Prior Functioning/Environment Prior Level of Function : Independent/Modified Independent;Working/employed             Mobility Comments: not using device for ambulation previously, 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 ADLs Comments: ind and working in home health    OT Problem List: Decreased strength;Decreased activity tolerance;Impaired balance (sitting and/or standing);Decreased knowledge of use of DME or AE;Decreased knowledge of precautions;Cardiopulmonary status limiting activity   OT Treatment/Interventions: Self-care/ADL training;Therapeutic exercise;Energy conservation;DME and/or AE instruction;Therapeutic activities;Patient/family education;Balance training      OT Goals(Current goals can be found in the care plan section)   Acute Rehab OT Goals Patient Stated Goal: get better OT Goal Formulation: With patient Time For Goal Achievement: 03/08/24 Potential to Achieve Goals: Good   OT Frequency:  Min 2X/week    Co-evaluation              AM-PAC OT 6  Clicks Daily Activity     Outcome Measure Help from another person eating meals?: None Help from another person taking care of personal grooming?: A Little Help from another person toileting, which includes using toliet, bedpan, or urinal?: A Little Help from another person bathing (including washing, rinsing, drying)?: A Little Help from another person to put on and taking off regular upper body clothing?: A Little Help from another person to put on and taking off regular lower body clothing?: A Little 6 Click Score: 19   End of Session Equipment Utilized During Treatment: Oxygen  (via face tent) Nurse Communication: Mobility status  Activity Tolerance: Patient tolerated treatment well Patient left: Other (comment) (with PT)  OT Visit Diagnosis: Unsteadiness on feet (R26.81);Muscle weakness (generalized) (M62.81)                Time: 8847-8784 OT Time Calculation (min): 23 min Charges:  OT General Charges $OT Visit: 1 Visit OT Evaluation $OT Eval Moderate Complexity: 1 Mod  Etta NOVAK, OT Acute Rehabilitation Services Office [phone] Secure Chat Preferred    Etta GORMAN Hope 02/23/2024, 1:37 PM

## 2024-02-23 NOTE — Progress Notes (Addendum)
 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 She had declined admission at that time. She presented today with low O2 and CXR with scattered ground glass opacities. Started on dexamethasone   Seen on face mask to stoma with increased work of breathing. Responds appropriately. BP 233/112  HR 116  O2 98% to be started on Precedex  drip  eICU Interventions  Labetalol  prn ordered and to given if without BP improvement after initiation of Precedex . Monitor glucose closely while on systemic steroids as may need insulin  drip. Currently on basal and SSI Poorly controlled CM and hypothyroidism, question compliance with meds. Levothyroxine  continued. Will discuss with bedside CCM team regarding empiric therapies while awaiting cultures Discussed with [facility]     Intervention Category Evaluation Type: New Patient Evaluation  Deborah [name] 02/23/2024, 1:15 AM

## 2024-02-23 NOTE — Progress Notes (Signed)
 Pt transported from 6E27 to 2M12 without complications.

## 2024-02-24 ENCOUNTER — Inpatient Hospital Stay (HOSPITAL_COMMUNITY)

## 2024-02-24 DIAGNOSIS — E875 Hyperkalemia: Secondary | ICD-10-CM | POA: Diagnosis not present

## 2024-02-24 DIAGNOSIS — J9601 Acute respiratory failure with hypoxia: Secondary | ICD-10-CM | POA: Diagnosis not present

## 2024-02-24 DIAGNOSIS — U071 COVID-19: Secondary | ICD-10-CM | POA: Diagnosis not present

## 2024-02-24 DIAGNOSIS — E119 Type 2 diabetes mellitus without complications: Secondary | ICD-10-CM

## 2024-02-24 DIAGNOSIS — J1282 Pneumonia due to coronavirus disease 2019: Secondary | ICD-10-CM | POA: Diagnosis not present

## 2024-02-24 LAB — CBC WITH DIFFERENTIAL/PLATELET
Abs Immature Granulocytes: 0.05 K/uL (ref 0.00–0.07)
Basophils Absolute: 0 K/uL (ref 0.0–0.1)
Basophils Relative: 0 %
Eosinophils Absolute: 0 K/uL (ref 0.0–0.5)
Eosinophils Relative: 0 %
HCT: 41 % (ref 36.0–46.0)
Hemoglobin: 12.9 g/dL (ref 12.0–15.0)
Immature Granulocytes: 1 %
Lymphocytes Relative: 14 %
Lymphs Abs: 1.4 K/uL (ref 0.7–4.0)
MCH: 29.4 pg (ref 26.0–34.0)
MCHC: 31.5 g/dL (ref 30.0–36.0)
MCV: 93.4 fL (ref 80.0–100.0)
Monocytes Absolute: 0.8 K/uL (ref 0.1–1.0)
Monocytes Relative: 8 %
Neutro Abs: 7.3 K/uL (ref 1.7–7.7)
Neutrophils Relative %: 77 %
Platelets: 404 K/uL — ABNORMAL HIGH (ref 150–400)
RBC: 4.39 MIL/uL (ref 3.87–5.11)
RDW: 14 % (ref 11.5–15.5)
WBC: 9.5 K/uL (ref 4.0–10.5)
nRBC: 0 % (ref 0.0–0.2)

## 2024-02-24 LAB — BASIC METABOLIC PANEL WITH GFR
Anion gap: 11 (ref 5–15)
BUN: 23 mg/dL (ref 8–23)
CO2: 29 mmol/L (ref 22–32)
Calcium: 9.3 mg/dL (ref 8.9–10.3)
Chloride: 93 mmol/L — ABNORMAL LOW (ref 98–111)
Creatinine, Ser: 0.9 mg/dL (ref 0.44–1.00)
GFR, Estimated: >60 mL/min (ref >60)
Glucose, Bld: 161 mg/dL — ABNORMAL HIGH (ref 70–99)
Potassium: 4.6 mmol/L (ref 3.5–5.1)
Sodium: 133 mmol/L — ABNORMAL LOW (ref 135–145)

## 2024-02-24 LAB — GLUCOSE, CAPILLARY
Glucose-Capillary: 155 mg/dL — ABNORMAL HIGH (ref 70–99)
Glucose-Capillary: 111 mg/dL — ABNORMAL HIGH (ref 70–99)
Glucose-Capillary: 177 mg/dL — ABNORMAL HIGH (ref 70–99)
Glucose-Capillary: 181 mg/dL — ABNORMAL HIGH (ref 70–99)

## 2024-02-24 LAB — MAGNESIUM: Magnesium: 2.6 mg/dL — ABNORMAL HIGH (ref 1.7–2.4)

## 2024-02-24 LAB — PHOSPHORUS: Phosphorus: 4.2 mg/dL (ref 2.5–4.6)

## 2024-02-24 MED ORDER — HYDROXYZINE HCL 25 MG PO TABS
25.0000 mg | ORAL_TABLET | Freq: Three times a day (TID) | ORAL | Status: DC | PRN
  Administered 2024-02-24 – 2024-02-27 (×6): 25 mg via ORAL
  Filled 2024-02-24 (×6): qty 1

## 2024-02-24 MED ORDER — BARICITINIB 2 MG PO TABS
4.0000 mg | ORAL_TABLET | Freq: Every day | ORAL | Status: DC
  Administered 2024-02-24 – 2024-02-28 (×5): 4 mg via ORAL
  Filled 2024-02-24 (×5): qty 2

## 2024-02-24 NOTE — Plan of Care (Signed)

## 2024-02-24 NOTE — TOC Transition Note (Addendum)
 Transition of Care [facility]) - Discharge Note   Patient Details  Name: Deborah [name] MRN: [id number] Date of Birth: 11-Feb-1956  [facility] CM/SW Contact:  Marval Gell, RN Phone Number: 02/24/2024, 11:18 AM   Clinical Narrative:     HH services set up through [facility], order placed for PT OT RN, will need face to face. Patient states that she will transport home by private car.      Barriers to Discharge: Continued Medical Work up   Patient Goals and CMS Choice            Discharge Placement                       Discharge Plan and Services Additional resources added to the After Visit Summary for     Discharge Planning Services: CM Consult Post Acute Care Choice: Home Health                    HH Arranged: RN, PT, OT [facility] Agency: [employer] [facility] Date [facility] Agency Contacted: 02/24/24 Time HH Agency Contacted: 1117 Representative spoke with at [facility] Agency: [place]  Social Drivers of Health (SDOH) Interventions SDOH Screenings   Food Insecurity: No Food Insecurity (02/23/2024)  Housing: Unknown (02/23/2024)  Transportation Needs: No Transportation Needs (02/23/2024)  Utilities: Not At Risk (02/23/2024)  Depression (PHQ2-9): Low Risk  (11/24/2022)  Social Connections: Socially Integrated (02/23/2024)  Tobacco Use: Medium Risk (02/22/2024)     Readmission Risk Interventions     No data to display

## 2024-02-24 NOTE — Assessment & Plan Note (Signed)
 CBGs overall controlled while on steroids for above. - Restart Lantus  35 units daily - Resistant SSI

## 2024-02-24 NOTE — Assessment & Plan Note (Addendum)
 Now out from ICU and stable. Patient afebrile but continues to be tachypneic and tachycardic. CXR showed scattered interstitial and ground glass opacities consistent with atypical pneumonia, compatible with clinical presentation given COVID-positive and chronic LLL atelectasis.   - Vital signs per floor - Tylenol  650 mg as needed every 6 hours - Dexamethasone  6 mg x 10 days per hospital guidelines, (9/9 -) - Zofran -ODT 4 mg Q8HR PRN  - O2 with goal of 88-92% due to COPD history  - PT/OT to treat - VTE prophylaxis: Lovenox  50 mg - AM BMP

## 2024-02-24 NOTE — Assessment & Plan Note (Addendum)
 Anxiety - continue home Lexapro  20 mg, bupropion  24-hour 150 mg HTN - continue home losartan  50 mg, carvedilol  12.5 mg 2 times daily, consider altering regimen as needed Asthma - continue home montelukast  10 mg HLD - continue home rosuvastatin  40 mg COPD - continue home budesonide , formoterol , and yupelri  nebulizers

## 2024-02-24 NOTE — Progress Notes (Addendum)
 NAME:  Deborah [name], MRN:  [id number], DOB:  03-07-1956, LOS: 2 ADMISSION DATE:  02/22/2024, CONSULTATION DATE:  02/22/24 REFERRING MD:  Family medicine, CHIEF COMPLAINT:  covid   History of Present Illness:  68 yo female presented after refusing admission yesterday with positive covid result and hypoxia at urgent care. Pt then called EMS services today was again noted to be hypoxic, infiltrates on imaging. Complaints of shortness of breath and cough. Denies sputum production. No f/c no n/v/d. Denies chest pain. States she feels fine just notes her oxygen  is low and she gets sob with a lot of mobility. No other associated symptoms  Pt reportedly had symptoms for 3 days prior to presentation to urgent care. 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 Pt states that she takes nebulizers at home thru her stoma which is the route she has currently been utilizing her nebs during this acute illness. She endorses taking her usually scheduled medications.    CCM was asked to eval per family medicine attending based on reported hospital protocol.    Pt was noted to be setting up on edge of bed eating a full dinner tray oxygen  with trach collar in place via stoma blowing by 11L.  She is in NO acute distress. Able to communicate without dyspnea, no accessory muscle use. When stoma is occluded speaks in full sentences without any difficulty. Provides history and also confirms her aversion to transfer to ICU when she feels fine.  Pertinent  Medical History  T2dm with hypglycemia Copd Htn Hyperlipidemia Anxiety Breast cancer (R) Hypothyroidism HFpEF (2024 LVEF 70%)  Significant Hospital Events: Including procedures, antibiotic start and stop dates in addition to other pertinent events   Presented to urgent care 9/7 (refused hospital eval and admission) Admitted to hospital 9/8 , 9/9  50% trach collar, required Precedex  overnight, transferred to floor  Interim History /  Subjective:  FiO2 back up to 70% / 12 L I tried to drop her at bedside to 50% -but saturations dropped to 89%. She appeared comfortable and able to move around in the room without distress   Objective    Blood pressure (!) 180/87, pulse 86, temperature (!) 97.4 F (36.3 C), temperature source Oral, resp. rate 18, height 5' 7 (1.702 m), weight 110.2 kg, SpO2 92%.    FiO2 (%):  [50 %-70 %] 70 %   Intake/Output Summary (Last 24 hours) at 02/24/2024 1544 Last data filed at 02/23/2024 2136 Gross per 24 hour  Intake 260 ml  Output --  Net 260 ml   Filed Weights   02/23/24 0110 02/23/24 2000  Weight: 109.6 kg 110.2 kg    Examination: General: nad elderly woman, sitting up in bed no distress HENT: ncat, eomi, perrla, mmmp, tracheal stoma with oxygen  collar Lungs: Clear breath sounds bilateral, no accessory muscle use Cardiovascular: S1-S2 regular Abdomen: soft, nt, nd bs + Extremities: no c/c/e Neuro: Interactive, nonfocal GU: deferred  Labs showed normal renal function, no leukocytosis  Resolved problem list   Acute metabolic encephalopathy -resolved   Assessment and Plan  Covid pneumonia Acute hypoxic resp failure Copd with acute exacerbation  -titrate oxygen  to sat >88% - She is unable to prone so encourage left lateral decubitus position -Triple therapy nebs -Dexamethasone  per protocol - Add baricitinib  due to persistent hypoxia   T2dm with uncontrolled hyperglycemia  -Increase Lantus  to home dose of 35 units SSI resistant scale    Hypothyroidism H/o htn Hyperlipidemia HFpEF H/o breast  cancer Ckd3a  - Per primary service  PCCM will follow peripherally while oxygen  requirements remain high  Labs   CBC: Recent Labs  Lab 02/21/24 1321 02/21/24 1350 02/22/24 1532 02/24/24 0411  WBC 6.2  --  8.0 9.5  NEUTROABS 4.2  --   --  7.3  HGB 12.5 13.3 12.7 12.9  HCT 39.0 39.0 40.4 41.0  MCV 92.9  --  94.8 93.4  PLT 278  --  325 404*    Basic  Metabolic Panel: Recent Labs  Lab 02/21/24 1350 02/22/24 1532 02/23/24 0237 02/23/24 0821 02/24/24 0411  NA 140 134* 132* 133* 133*  K 4.1 4.7 5.8* 4.2 4.6  CL 102 96* 97* 96* 93*  CO2  --  25 26 27 29   GLUCOSE 73 317* 305* 232* 161*  BUN 11 16 18 21 23   CREATININE 1.00 1.13* 1.10* 1.27* 0.90  CALCIUM   --  8.8* 8.8* 9.2 9.3  MG  --   --   --   --  2.6*  PHOS  --   --   --   --  4.2   GFR: Estimated Creatinine Clearance: 78.6 mL/min (by C-G formula based on SCr of 0.9 mg/dL). Recent Labs  Lab 02/21/24 1321 02/22/24 1532 02/24/24 0411  WBC 6.2 8.0 9.5    Liver Function Tests: No results for input(s): AST, ALT, ALKPHOS, BILITOT, PROT, ALBUMIN  in the last 168 hours. No results for input(s): LIPASE, AMYLASE in the last 168 hours. No results for input(s): AMMONIA in the last 168 hours.  ABG    Component Value Date/Time   PHART 7.445 08/02/2012 1959   PCO2ART 39.8 08/02/2012 1959   PO2ART 94.5 08/02/2012 1959   HCO3 21.3 05/23/2023 1523   TCO2 28 02/21/2024 1350   ACIDBASEDEF 2.0 05/23/2023 1523   O2SAT 97 05/23/2023 1523     Coagulation Profile: No results for input(s): INR, PROTIME in the last 168 hours.  Cardiac Enzymes: No results for input(s): CKTOTAL, CKMB, CKMBINDEX, TROPONINI in the last 168 hours.  HbA1C: HbA1c, POC (controlled diabetic range)  Date/Time Value Ref Range Status  01/25/2024 09:01 AM 6.5 0.0 - 7.0 % Final  10/08/2023 03:09 PM 10.5 (A) 0.0 - 7.0 % Final    CBG: Recent Labs  Lab 02/23/24 1104 02/23/24 1645 02/23/24 2135 02/24/24 0801 02/24/24 1237  GLUCAP 183* 245* 158* 155* 111*      Harden Staff MD. FCCP. [facility] Pager : [phone]  If no response to pager , please call [phone] until 7 pm After 7:00 pm call Elink  [phone]   02/24/2024

## 2024-02-24 NOTE — Progress Notes (Signed)
 Daily Progress Note Intern Pager: [phone]  Patient name: Deborah [name] Medical record number: [id number] Date of birth: [birth year] Age: 68 y.o. Gender: female  Primary Care Provider: Cleotilde Lukes, DO Consultants: PT/OT Code Status: Full  Pt Overview and Major Events to Date:  9/9: pt transferred to ICU 9/10: pt returned from ICU  Medical Decision Making:  Deborah [name] is a 68 y.o. female with a PMH of breast cancer, COPD, and HFrEF presenting with SOB and known COVID diagnosis from UC. Returning from ICU after reduced oxygen  requirement and stable VS. Assessment & Plan Acute hypoxic respiratory failure (HCC) COVID Now out from ICU and stable. Patient afebrile but continues to be tachypneic and tachycardic. CXR showed scattered interstitial and ground glass opacities consistent with atypical pneumonia, compatible with clinical presentation given COVID-positive and chronic LLL atelectasis.   - Vital signs per floor - Tylenol  650 mg as needed every 6 hours - Dexamethasone  6 mg x 10 days per hospital guidelines, (9/9 -) - Zofran -ODT 4 mg Q8HR PRN  - O2 with goal of 88-92% due to COPD history  - PT/OT to treat - VTE prophylaxis: Lovenox  50 mg - AM BMP T2DM (type 2 diabetes mellitus) (HCC) CBGs overall controlled while on steroids for above. - Restart Lantus  35 units daily - Resistant SSI Chronic health problem Anxiety - continue home Lexapro  20 mg, bupropion  24-hour 150 mg HTN - continue home losartan  50 mg, carvedilol  12.5 mg 2 times daily, consider altering regimen as needed Asthma - continue home montelukast  10 mg HLD - continue home rosuvastatin  40 mg COPD - continue home budesonide , formoterol , and yupelri  nebulizers   FEN/GI: Regular diet PPx: Lovenox  Dispo:Home pending clinical improvement .   Subjective:  Pt states she is felling well and that her breathing is greatly improved from yesterday. She does endorse felling anxious at this time. I  informed the patient that she has Q8h PRN atarax  to help with her anxiety if she requests it from her nurse.  Objective: Temp:  [95 F (35 C)-98 F (36.7 C)] 97.4 F (36.3 C) (09/10 0808) Pulse Rate:  [67-86] 86 (09/10 0404) Resp:  [14-21] 18 (09/10 0404) BP: (134-180)/(61-87) 180/87 (09/10 0808) SpO2:  [88 %-96 %] 92 % (09/10 0740) FiO2 (%):  [40 %-70 %] 70 % (09/10 0740) Weight:  [110.2 kg] 110.2 kg (09/09 2000) Physical Exam: General: Well appearing. NAD. Joking with provider.  Cardiovascular: regular rate and rhythm. Respiratory: some inspiratory wheezing at bilateral apices, R>L. Distant breath sounds diffusely Abdomen: soft, nontender, nondistended.  Extremities: no edema. Moving all extremities normally.  Laboratory: Most recent CBC Lab Results  Component Value Date   WBC 9.5 02/24/2024   HGB 12.9 02/24/2024   HCT 41.0 02/24/2024   MCV 93.4 02/24/2024   PLT 404 (H) 02/24/2024   Most recent BMP    Latest Ref Rng & Units 02/24/2024    4:11 AM  BMP  Glucose 70 - 99 mg/dL 838   BUN 8 - 23 mg/dL 23   Creatinine 9.55 - 1.00 mg/dL 9.09   Sodium 864 - 854 mmol/L 133   Potassium 3.5 - 5.1 mmol/L 4.6   Chloride 98 - 111 mmol/L 93   CO2 22 - 32 mmol/L 29   Calcium  8.9 - 10.3 mg/dL 9.3      Deborah [name], Deborah [name], Medical Student 02/24/2024, 10:40 AM  MS4 AI, [place] [facility] FPTS Intern pager: [phone], text pages welcome Secure chat group [facility] Family Medicine  Hospital Teaching Service   I agree with the assessment and plan as documented above.  Deborah Redo, MD PGY-3, [facility] Family Medicine

## 2024-02-24 NOTE — Progress Notes (Signed)
 Physical Therapy Treatment Patient Details Name: Deborah [name] MRN: [id number] DOB: Nov 12, 1955 Today's Date: 02/24/2024   History of Present Illness Pt is 68 y/o female presenting on 9/8 with SOB due to COVID (noted presented to urgent care 9/7 but refused hospital admission).  Admitted with covid pneumonia. CXR with scattered interstitial and ground glass opacities.  PMH: COPD/asthma, laryngectomy, HTN, GERD, hypothyroidism, depression, breast cancer, DM2.    PT Comments  Pt seen for PT tx with pt agreeable. Pt on 11L/min, 70% FiO2 via trach collar with portable collar in room maxing out at 55% FiO2; sent message via secure chat if pt could receive portable collar that meets pt's needs or if pt can transition to one in room but still awaiting response, therefore session focused on EOB exercises. Pt performed sit<>stand without BUE support, mini squats, marching in place for BLE strengthening & endurance training, as well as single leg stance for balance training. Lowest accurate SPO2 reading of 85% during session but pt recovers to >/= 90%. Will continue to follow pt acutely to progress gait & stair negotiation as able.    If plan is discharge home, recommend the following:     Can travel by private vehicle        Equipment Recommendations  None recommended by PT    Recommendations for Other Services       Precautions / Restrictions Precautions Precautions: Fall Restrictions Weight Bearing Restrictions Per Provider Order: No     Mobility  Bed Mobility Overal bed mobility: Modified Independent Bed Mobility: Supine to Sit, Sit to Supine     Supine to sit: Modified independent (Device/Increase time), HOB elevated Sit to supine: Modified independent (Device/Increase time), HOB elevated   General bed mobility comments: able to reposition in bed without assistance    Transfers Overall transfer level: Independent Equipment used: None Transfers: Sit to/from Stand Sit to  Stand: Independent                Ambulation/Gait                   Stairs             Wheelchair Mobility     Tilt Bed    Modified Rankin (Stroke Patients Only)       Balance                                            Communication Communication Communication: Impaired Factors Affecting Communication:  (h/o laryngectomy with residual stoma, self occludes & speaks at low volume)  Cognition Arousal: Alert Behavior During Therapy: WFL for tasks assessed/performed   PT - Cognitive impairments: No apparent impairments                         Following commands: Intact      Cueing Cueing Techniques: Verbal cues  Exercises Other Exercises Other Exercises: Pt performs 10x sit<>stand from EOB without BUE support with focus on BLE strengthening & endurance training. Other Exercises: Pt performs 10 reps x 2 sets mini squats with multimodal cuing for posture/technique with focus on BLE strengthening. Marching in place to tolerance without BUE support, no LOB. Other Exercises: Pt performed single leg stance on either LE with up to min assist for balance, max static standing in single leg stance ~5 seconds (better on LLE than  RLE).    General Comments        Pertinent Vitals/Pain Pain Assessment Pain Assessment: No/denies pain    Home Living                          Prior Function            PT Goals (current goals can now be found in the care plan section) Acute Rehab PT Goals Patient Stated Goal: return to independent PT Goal Formulation: With patient/family Potential to Achieve Goals: Good Progress towards PT goals: Progressing toward goals    Frequency    Min 2X/week      PT Plan      Co-evaluation              AM-PAC PT 6 Clicks Mobility   Outcome Measure  Help needed turning from your back to your side while in a flat bed without using bedrails?: None Help needed moving from  lying on your back to sitting on the side of a flat bed without using bedrails?: None Help needed moving to and from a bed to a chair (including a wheelchair)?: None Help needed standing up from a chair using your arms (e.g., wheelchair or bedside chair)?: None Help needed to walk in hospital room?: A Little Help needed climbing 3-5 steps with a railing? : A Little 6 Click Score: 22    End of Session Equipment Utilized During Treatment: Oxygen  Activity Tolerance: Patient tolerated treatment well Patient left: in bed;with call bell/phone within reach;with bed alarm set   PT Visit Diagnosis: Muscle weakness (generalized) (M62.81);Difficulty in walking, not elsewhere classified (R26.2)     Time: 8579-8556 PT Time Calculation (min) (ACUTE ONLY): 23 min  Charges:    $Therapeutic Exercise: 8-22 mins $Therapeutic Activity: 8-22 mins PT General Charges $$ ACUTE PT VISIT: 1 Visit                     Richerd Pinal, PT, DPT 02/24/24, 2:53 PM    [name] 02/24/2024, 2:51 PM

## 2024-02-25 ENCOUNTER — Inpatient Hospital Stay (HOSPITAL_COMMUNITY)

## 2024-02-25 DIAGNOSIS — U071 COVID-19: Secondary | ICD-10-CM | POA: Diagnosis not present

## 2024-02-25 DIAGNOSIS — J9601 Acute respiratory failure with hypoxia: Secondary | ICD-10-CM | POA: Diagnosis not present

## 2024-02-25 LAB — GLUCOSE, CAPILLARY
Glucose-Capillary: 101 mg/dL — ABNORMAL HIGH (ref 70–99)
Glucose-Capillary: 162 mg/dL — ABNORMAL HIGH (ref 70–99)
Glucose-Capillary: 281 mg/dL — ABNORMAL HIGH (ref 70–99)
Glucose-Capillary: 168 mg/dL — ABNORMAL HIGH (ref 70–99)

## 2024-02-25 LAB — BASIC METABOLIC PANEL WITH GFR
Anion gap: 9 (ref 5–15)
BUN: 25 mg/dL — ABNORMAL HIGH (ref 8–23)
CO2: 29 mmol/L (ref 22–32)
Calcium: 8.9 mg/dL (ref 8.9–10.3)
Chloride: 94 mmol/L — ABNORMAL LOW (ref 98–111)
Creatinine, Ser: 1.19 mg/dL — ABNORMAL HIGH (ref 0.44–1.00)
GFR, Estimated: 50 mL/min — ABNORMAL LOW (ref >60)
Glucose, Bld: 173 mg/dL — ABNORMAL HIGH (ref 70–99)
Potassium: 3.6 mmol/L (ref 3.5–5.1)
Sodium: 132 mmol/L — ABNORMAL LOW (ref 135–145)

## 2024-02-25 LAB — D-DIMER, QUANTITATIVE: D-Dimer, Quant: 0.29 ug{FEU}/mL (ref 0.00–0.50)

## 2024-02-25 LAB — HEPATITIS PANEL, ACUTE
HCV Ab: NONREACTIVE
Hep A IgM: NONREACTIVE
Hep B C IgM: NONREACTIVE
Hepatitis B Surface Ag: NONREACTIVE

## 2024-02-25 NOTE — Progress Notes (Addendum)
 Daily Progress Note Intern Pager: [phone]  Patient name: Deborah [name] Medical record number: [id number] Date of birth: 1956-03-14 Age: 68 y.o. Gender: female  Primary Care Provider: Cleotilde Lukes, DO Consultants: PT/OT Code Status: Full  Pt Overview and Major Events to Date:  9/9: pt transferred to ICU 9/10: pt returned from ICU  Medical Decision Making:  Jatia Musa is a 68 y.o. female with a PMH of breast cancer, COPD, and HFrEF presenting with SOB and known COVID diagnosis from UC. Returning from ICU after reduced oxygen  requirement and stable VS.  Assessment & Plan Acute hypoxic respiratory failure (HCC) Pneumonia due to COVID-19 virus Now out from ICU and stable. Patient afebrile with heart rate and respiratory rate within normal limits.  CXR showed scattered interstitial and ground glass opacities consistent with atypical pneumonia, compatible with clinical presentation given COVID-positive and chronic LLL atelectasis.   - Vital signs per floor - Tylenol  650 mg as needed every 6 hours - Dexamethasone  6 mg x 10 days per hospital guidelines, (9/9 - 9/18) - Baricitinib  added by critical care (hepatitis panel ordered to r/o infection) - Zofran -ODT 4 mg Q8HR PRN  - CXR ordered to assess for pulmonary edema -  O2 supplementation with goal of 88-92% due to COPD history. Will monitor - PT/OT to treat - VTE prophylaxis: Lovenox  50 mg - AM BMP T2DM (type 2 diabetes mellitus) (HCC) CBGs overall controlled while on steroids for above. - Continue Lantus  35 units daily - Resistant SSI Chronic health problem Anxiety - continue home Lexapro  20 mg, bupropion  24-hour 150 mg HTN - continue home losartan  50 mg, carvedilol  12.5 mg 2 times daily, consider altering regimen as needed Asthma - continue home montelukast  10 mg HLD - continue home rosuvastatin  40 mg COPD - continue home budesonide , formoterol , and yupelri  nebulizers   FEN/GI: Regular Diet PPx:  Lovenox  Dispo:Home pending clinical improvement .  Subjective:  Pt states she is feeling well and is eager to go home. She denies any shortness of breath or chest pain. Pt took off trache collar to speak, and states that she is comfortable leaving the collar off and see how she feels. She states she does not use oxygen  at  baseline at home.   Objective: Temp:  [97.7 F (36.5 C)-98.6 F (37 C)] 98.6 F (37 C) (09/11 0738) Pulse Rate:  [66-77] 71 (09/11 0738) Resp:  [18-20] 18 (09/11 0000) BP: (149-157)/(81-123) 157/123 (09/11 0738) SpO2:  [94 %-99 %] 97 % (09/11 0738) FiO2 (%):  [70 %] 70 % (09/11 0734) Physical Exam: General: NAD. Nontoxic appearing Cardiovascular: regular rate and rhythm Respiratory: normal work of breathing. Diffuse wheezing in upper lobes. Distant breath sounds throughout. Abdomen: nontender. Nondistended Extremities: moving all limbs normally. No edema  Laboratory: Most recent CBC Lab Results  Component Value Date   WBC 9.5 02/24/2024   HGB 12.9 02/24/2024   HCT 41.0 02/24/2024   MCV 93.4 02/24/2024   PLT 404 (H) 02/24/2024   Most recent BMP    Latest Ref Rng & Units 02/24/2024    4:11 AM  BMP  Glucose 70 - 99 mg/dL 838   BUN 8 - 23 mg/dL 23   Creatinine 9.55 - 1.00 mg/dL 9.09   Sodium 864 - 854 mmol/L 133   Potassium 3.5 - 5.1 mmol/L 4.6   Chloride 98 - 111 mmol/L 93   CO2 22 - 32 mmol/L 29   Calcium  8.9 - 10.3 mg/dL 9.3  Imaging/Diagnostic Tests: Radiologist Impression:  PORTABLE CHEST 1 VIEW 02/24/24 IMPRESSION: No acute abnormality seen.  Benjamine Marsa DASEN, Medical Student 02/25/2024, 9:07 AM  MS4 AI, [facility] FPTS Intern pager: [phone], text pages welcome Secure chat group [facility] Teaching Service   I agree with the assessment and plan as documented above.  Stuart Redo, MD PGY-3, [facility]

## 2024-02-25 NOTE — Assessment & Plan Note (Addendum)
 Now out from ICU and stable. Patient afebrile with heart rate and respiratory rate within normal limits.  CXR showed scattered interstitial and ground glass opacities consistent with atypical pneumonia, compatible with clinical presentation given COVID-positive and chronic LLL atelectasis.   - Vital signs per floor - Tylenol  650 mg as needed every 6 hours - Dexamethasone  6 mg x 10 days per hospital guidelines, (9/9 - 9/18) - Baricitinib  added by critical care (hepatitis panel ordered to r/o infection) - Zofran -ODT 4 mg Q8HR PRN  - CXR ordered to assess for pulmonary edema -  O2 supplementation with goal of 88-92% due to COPD history. Will monitor - PT/OT to treat - VTE prophylaxis: Lovenox  50 mg - AM BMP

## 2024-02-25 NOTE — Plan of Care (Signed)

## 2024-02-25 NOTE — Progress Notes (Signed)
 NAME:  Anber Mckiver, MRN:  [id number], DOB:  [birth year], LOS: 3 ADMISSION DATE:  02/22/2024, CONSULTATION DATE:  02/22/24 REFERRING MD:  Family medicine, CHIEF COMPLAINT:  covid   History of Present Illness:  68 yo female presented after refusing admission yesterday with positive covid result and hypoxia at urgent care. Pt then called EMS services today was again noted to be hypoxic, infiltrates on imaging. Complaints of shortness of breath and cough. Denies sputum production. No f/c no n/v/d. Denies chest pain. States she feels fine just notes her oxygen  is low and she gets sob with a lot of mobility. No other associated symptoms  Pt reportedly had symptoms for 3 days prior to presentation to urgent care. 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 Pt states that she takes nebulizers at home thru her stoma which is the route she has currently been utilizing her nebs during this acute illness. She endorses taking her usually scheduled medications.    CCM was asked to eval per family medicine attending based on reported hospital protocol.    Pt was noted to be setting up on edge of bed eating a full dinner tray oxygen  with trach collar in place via stoma blowing by 11L.  She is in NO acute distress. Able to communicate without dyspnea, no accessory muscle use. When stoma is occluded speaks in full sentences without any difficulty. Provides history and also confirms her aversion to transfer to ICU when she feels fine.  Pertinent  Medical History  T2dm with hypglycemia Copd Htn Hyperlipidemia Anxiety Breast cancer (R) Hypothyroidism HFpEF (2024 LVEF 70%)   Significant Hospital Events: Including procedures, antibiotic start and stop dates in addition to other pertinent events   Presented to urgent care 9/7 (refused hospital eval and admission) Admitted to hospital 9/8 , 9/9  50% trach collar, required Precedex  overnight, transferred to floor 9/10 FiO2 back up  to 70% / 12 L  Interim History / Subjective:   Remains on 70%, 11 L. Transferred to 4 E.   Objective    Blood pressure (!) 168/61, pulse 66, temperature 98.3 F (36.8 C), temperature source Oral, resp. rate 18, height 5' 7 (1.702 m), weight 110.2 kg, SpO2 91%.    FiO2 (%):  [70 %] 70 %   Intake/Output Summary (Last 24 hours) at 02/25/2024 1514 Last data filed at 02/24/2024 1938 Gross per 24 hour  Intake 240 ml  Output --  Net 240 ml   Filed Weights   02/23/24 0110 02/23/24 2000  Weight: 109.6 kg 110.2 kg    Examination: General: elderly woman, sitting up in bed no distress HENT: ncat, eomi, perrla, mmmp, tracheal stoma with oxygen  collar , able to speak but closing stoma Lungs: No accessory muscle use, clear breath sounds bilateral Cardiovascular: S1-S2 regular Abdomen: soft, nt, nd bs + Extremities: no c/c/e Neuro: Interactive, nonfocal GU: deferred  Labs showed slight increase in creatinine to 1.2, mild hyponatremia, no leukocytosis Chest x-ray shows clearing of infiltrates  Resolved problem list   Acute metabolic encephalopathy -resolved   Assessment and Plan  Covid pneumonia, infiltrates have cleared but hypoxia persists Acute hypoxic resp failure Copd with acute exacerbation Chronic left lower lobe atelectasis -since she had an aspiration episode with voice prosthesis in 2023  -titrate oxygen  to sat >88% -Triple therapy nebs -Dexamethasone  per protocol - Added baricitinib  due to persistent hypoxia 9/10 -plan for 14 days - Tracheobronchial toilet with chest PT, saline nebs - Check D-dimer, if positive  may need CT angiogram to rule out PE   T2dm , worsened with steroids - Lantus  to home dose of 35 units SSI resistant scale    Hypothyroidism H/o htn Hyperlipidemia HFpEF H/o breast cancer Ckd3a  - Per primary service  PCCM will follow  Labs   CBC: Recent Labs  Lab 02/21/24 1321 02/21/24 1350 02/22/24 1532 02/24/24 0411  WBC 6.2  --   8.0 9.5  NEUTROABS 4.2  --   --  7.3  HGB 12.5 13.3 12.7 12.9  HCT 39.0 39.0 40.4 41.0  MCV 92.9  --  94.8 93.4  PLT 278  --  325 404*    Basic Metabolic Panel: Recent Labs  Lab 02/22/24 1532 02/23/24 0237 02/23/24 0821 02/24/24 0411 02/25/24 1212  NA 134* 132* 133* 133* 132*  K 4.7 5.8* 4.2 4.6 3.6  CL 96* 97* 96* 93* 94*  CO2 25 26 27 29 29   GLUCOSE 317* 305* 232* 161* 173*  BUN 16 18 21 23  25*  CREATININE 1.13* 1.10* 1.27* 0.90 1.19*  CALCIUM  8.8* 8.8* 9.2 9.3 8.9  MG  --   --   --  2.6*  --   PHOS  --   --   --  4.2  --    GFR: Estimated Creatinine Clearance: 59.5 mL/min (A) (by C-G formula based on SCr of 1.19 mg/dL (H)). Recent Labs  Lab 02/21/24 1321 02/22/24 1532 02/24/24 0411  WBC 6.2 8.0 9.5    Liver Function Tests: No results for input(s): AST, ALT, ALKPHOS, BILITOT, PROT, ALBUMIN  in the last 168 hours. No results for input(s): LIPASE, AMYLASE in the last 168 hours. No results for input(s): AMMONIA in the last 168 hours.  ABG    Component Value Date/Time   PHART 7.445 08/02/2012 1959   PCO2ART 39.8 08/02/2012 1959   PO2ART 94.5 08/02/2012 1959   HCO3 21.3 05/23/2023 1523   TCO2 28 02/21/2024 1350   ACIDBASEDEF 2.0 05/23/2023 1523   O2SAT 97 05/23/2023 1523     Coagulation Profile: No results for input(s): INR, PROTIME in the last 168 hours.  Cardiac Enzymes: No results for input(s): CKTOTAL, CKMB, CKMBINDEX, TROPONINI in the last 168 hours.  HbA1C: HbA1c, POC (controlled diabetic range)  Date/Time Value Ref Range Status  01/25/2024 09:01 AM 6.5 0.0 - 7.0 % Final  10/08/2023 03:09 PM 10.5 (A) 0.0 - 7.0 % Final    CBG: Recent Labs  Lab 02/24/24 1237 02/24/24 1708 02/24/24 2117 02/25/24 0736 02/25/24 1228  GLUCAP 111* 177* 181* 101* 162*      Harden Staff MD. FCCP. [facility] Pager : [phone]  If no response to pager , please call [phone] until 7 pm After 7:00 pm call  Elink  [phone]   02/25/2024

## 2024-02-25 NOTE — Assessment & Plan Note (Addendum)
 CBGs overall controlled while on steroids for above. - Continue Lantus  35 units daily - Resistant SSI

## 2024-02-25 NOTE — Progress Notes (Addendum)
 Patient brought to 4E from 5W. Telemetry box applied, CCMD notified. Patient oriented to room and staff. Call bell in reach. Husband present.   02/25/24 1307  Vitals  Temp 98.3 F (36.8 C)  Temp Source Oral  BP (!) 168/61  MAP (mmHg) 88  BP Location Right Leg  BP Method Automatic  Patient Position (if appropriate) Lying  Pulse Rate 66  Pulse Rate Source Monitor  ECG Heart Rate 66  Resp 18  Level of Consciousness  Level of Consciousness Alert  MEWS COLOR  MEWS Score Color Green  Oxygen  Therapy  SpO2 91 %  O2 Device Tracheostomy Collar  O2 Flow Rate (L/min) 11 L/min  FiO2 (%) 70 %  MEWS Score  MEWS Temp 0  MEWS Systolic 0  MEWS Pulse 0  MEWS RR 0  MEWS LOC 0  MEWS Score 0

## 2024-02-25 NOTE — Assessment & Plan Note (Addendum)
 Anxiety - continue home Lexapro  20 mg, bupropion  24-hour 150 mg HTN - continue home losartan  50 mg, carvedilol  12.5 mg 2 times daily, consider altering regimen as needed Asthma - continue home montelukast  10 mg HLD - continue home rosuvastatin  40 mg COPD - continue home budesonide , formoterol , and yupelri  nebulizers

## 2024-02-26 DIAGNOSIS — U071 COVID-19: Secondary | ICD-10-CM | POA: Diagnosis not present

## 2024-02-26 DIAGNOSIS — J9601 Acute respiratory failure with hypoxia: Secondary | ICD-10-CM | POA: Diagnosis not present

## 2024-02-26 LAB — HEPATIC FUNCTION PANEL
ALT: 43 U/L (ref 0–44)
AST: 38 U/L (ref 15–41)
Albumin: 3.4 g/dL — ABNORMAL LOW (ref 3.5–5.0)
Alkaline Phosphatase: 83 U/L (ref 38–126)
Bilirubin, Direct: 0.1 mg/dL (ref 0.0–0.2)
Indirect Bilirubin: 0.3 mg/dL (ref 0.3–0.9)
Total Bilirubin: 0.4 mg/dL (ref 0.0–1.2)
Total Protein: 7.9 g/dL (ref 6.5–8.1)

## 2024-02-26 LAB — BASIC METABOLIC PANEL WITH GFR
Anion gap: 9 (ref 5–15)
BUN: 23 mg/dL (ref 8–23)
CO2: 28 mmol/L (ref 22–32)
Calcium: 8.8 mg/dL — ABNORMAL LOW (ref 8.9–10.3)
Chloride: 98 mmol/L (ref 98–111)
Creatinine, Ser: 1.02 mg/dL — ABNORMAL HIGH (ref 0.44–1.00)
GFR, Estimated: >60 mL/min (ref >60)
Glucose, Bld: 117 mg/dL — ABNORMAL HIGH (ref 70–99)
Potassium: 4.3 mmol/L (ref 3.5–5.1)
Sodium: 135 mmol/L (ref 135–145)

## 2024-02-26 LAB — CBC
HCT: 40.7 % (ref 36.0–46.0)
Hemoglobin: 13.2 g/dL (ref 12.0–15.0)
MCH: 29.5 pg (ref 26.0–34.0)
MCHC: 32.4 g/dL (ref 30.0–36.0)
MCV: 91.1 fL (ref 80.0–100.0)
Platelets: 366 K/uL (ref 150–400)
RBC: 4.47 MIL/uL (ref 3.87–5.11)
RDW: 13.6 % (ref 11.5–15.5)
WBC: 6.4 K/uL (ref 4.0–10.5)
nRBC: 0 % (ref 0.0–0.2)

## 2024-02-26 LAB — GLUCOSE, CAPILLARY
Glucose-Capillary: 105 mg/dL — ABNORMAL HIGH (ref 70–99)
Glucose-Capillary: 133 mg/dL — ABNORMAL HIGH (ref 70–99)
Glucose-Capillary: 288 mg/dL — ABNORMAL HIGH (ref 70–99)
Glucose-Capillary: 149 mg/dL — ABNORMAL HIGH (ref 70–99)

## 2024-02-26 NOTE — Assessment & Plan Note (Addendum)
 Continuing to improve. Patient afebrile with heart rate and respiratory rate within normal limits.  Initial CXR showed scattered interstitial and ground glass opacities consistent with atypical pneumonia, compatible with clinical presentation given COVID-positive and chronic LLL atelectasis. Follow up CXR on 9/11 without active disease.   - Vital signs per floor - Tylenol  650 mg as needed every 6 hours - Dexamethasone  6 mg until discharge - Baricitinib  added by critical care (hepatitis panel ordered to r/o infection) until discharge - Zofran -ODT 4 mg Q8HR PRN  -  O2 supplementation with goal of 88-92% due to COPD history. Will monitor - PT/OT to treat - Ambulatory pulse ox ordered - VTE prophylaxis: Lovenox  50 mg - AM BMP

## 2024-02-26 NOTE — Assessment & Plan Note (Addendum)
 Anxiety - continue home Lexapro  20 mg, bupropion  24-hour 150 mg HTN - continue home losartan  50 mg, carvedilol  12.5 mg 2 times daily, consider altering regimen as needed Asthma - continue home montelukast  10 mg HLD - continue home rosuvastatin  40 mg COPD - continue home budesonide , formoterol , and yupelri  nebulizers

## 2024-02-26 NOTE — Progress Notes (Signed)
 SATURATION QUALIFICATIONS: (This note is used to comply with regulatory documentation for home oxygen )  Patient Saturations on Room Air at Rest = 84%  Patient Saturations on Room Air while Ambulating = N/A  Patient Saturations on 6 Liters of oxygen  FiO2 35% while Ambulating = 88%  Please briefly explain why patient needs home oxygen : Pt hypoxic on RA at rest and on 4L/min trach mask, needed increase to 35% FiO2 via trach mask to maintain SpO2 88% and above.

## 2024-02-26 NOTE — Progress Notes (Addendum)
 NAME:  Deborah [name], MRN:  [id number], DOB:  1955-11-13, LOS: 4 ADMISSION DATE:  02/22/2024, CONSULTATION DATE:  02/22/24 REFERRING MD:  Family medicine, CHIEF COMPLAINT:  covid   History of Present Illness:  68 yo female presented after refusing admission yesterday with positive covid result and hypoxia at urgent care. Pt then called EMS services today was again noted to be hypoxic, infiltrates on imaging. Complaints of shortness of breath and cough. Denies sputum production. No f/c no n/v/d. Denies chest pain. States she feels fine just notes her oxygen  is low and she gets sob with a lot of mobility. No other associated symptoms  Pt reportedly had symptoms for 3 days prior to presentation to urgent care. 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 Pt states that she takes nebulizers at home thru her stoma which is the route she has currently been utilizing her nebs during this acute illness. She endorses taking her usually scheduled medications.    CCM was asked to eval per family medicine attending based on reported hospital protocol.    Pt was noted to be setting up on edge of bed eating a full dinner tray oxygen  with trach collar in place via stoma blowing by 11L.  She is in NO acute distress. Able to communicate without dyspnea, no accessory muscle use. When stoma is occluded speaks in full sentences without any difficulty. Provides history and also confirms her aversion to transfer to ICU when she feels fine.  Pertinent  Medical History  T2dm with hypglycemia Copd Htn Hyperlipidemia Anxiety Breast cancer (R) Hypothyroidism HFpEF (2024 LVEF 70%)   Significant Hospital Events: Including procedures, antibiotic start and stop dates in addition to other pertinent events   Presented to urgent care 9/7 (refused hospital eval and admission) Admitted to hospital 9/8 , 9/9  50% trach collar, required Precedex  overnight, transferred to floor 9/10 FiO2 back up  to 70% / 12 L  Interim History / Subjective:   Afebrile Breathing better Down to 50% / 11 L   Objective    Blood pressure (!) 116/56, pulse 67, temperature 98 F (36.7 C), temperature source Oral, resp. rate 14, height 5' 7 (1.702 m), weight 110.2 kg, SpO2 94%.    FiO2 (%):  [50 %-70 %] 50 %   Intake/Output Summary (Last 24 hours) at 02/26/2024 1315 Last data filed at 02/26/2024 0800 Gross per 24 hour  Intake 360 ml  Output --  Net 360 ml   Filed Weights   02/23/24 0110 02/23/24 2000  Weight: 109.6 kg 110.2 kg    Examination: General: elderly woman, lying supine in bed no distress HENT: ncat, eomi, perrla, mmmp, tracheal stoma with oxygen  collar , able to speak by closing stoma Lungs: No accessory muscle use, clear breath sounds bilateral, no rhonchi Cardiovascular: S1-S2 regular Abdomen: soft, nt, nd bs + Extremities: no edema or deformity Neuro: Interactive, nonfocal GU: deferred  Labs showed normal electrolytes, creatinine back down to 1.0, no leukocytosis Chest x-ray shows clearing of infiltrates  Resolved problem list   Acute metabolic encephalopathy -resolved   Assessment and Plan  Covid pneumonia, vaccinated patient, infiltrates have improved on chest x-ray but hypoxia persists.  Acute hypoxic resp failure Copd with acute exacerbation Chronic left lower lobe atelectasis -since she had an aspiration episode with voice prosthesis in 2023   D-dimer negative so low likelihood of VTE  -titrate oxygen  to sat >88% -Triple therapy nebs -Dexamethasone  per protocol - Added baricitinib  due to persistent  hypoxia 9/10 -plan for 14 days or until discharge - Tracheobronchial toilet with chest PT, saline nebs    T2dm , worsened with steroids - Lantus  to home dose of 35 units SSI resistant scale    Hypothyroidism H/o htn Hyperlipidemia HFpEF H/o breast cancer Ckd3a  - Per primary service  PCCM will follow peripherally, expecting gradual improvement  here.  Oxygen  requirements will need to decrease to be able to discharge home Labs   CBC: Recent Labs  Lab 02/21/24 1321 02/21/24 1350 02/22/24 1532 02/24/24 0411 02/26/24 0309  WBC 6.2  --  8.0 9.5 6.4  NEUTROABS 4.2  --   --  7.3  --   HGB 12.5 13.3 12.7 12.9 13.2  HCT 39.0 39.0 40.4 41.0 40.7  MCV 92.9  --  94.8 93.4 91.1  PLT 278  --  325 404* 366    Basic Metabolic Panel: Recent Labs  Lab 02/23/24 0237 02/23/24 0821 02/24/24 0411 02/25/24 1212 02/26/24 0309  NA 132* 133* 133* 132* 135  K 5.8* 4.2 4.6 3.6 4.3  CL 97* 96* 93* 94* 98  CO2 26 27 29 29 28   GLUCOSE 305* 232* 161* 173* 117*  BUN 18 21 23  25* 23  CREATININE 1.10* 1.27* 0.90 1.19* 1.02*  CALCIUM  8.8* 9.2 9.3 8.9 8.8*  MG  --   --  2.6*  --   --   PHOS  --   --  4.2  --   --    GFR: Estimated Creatinine Clearance: 69.4 mL/min (A) (by C-G formula based on SCr of 1.02 mg/dL (H)). Recent Labs  Lab 02/21/24 1321 02/22/24 1532 02/24/24 0411 02/26/24 0309  WBC 6.2 8.0 9.5 6.4    Liver Function Tests: Recent Labs  Lab 02/26/24 0309  AST 38  ALT 43  ALKPHOS 83  BILITOT 0.4  PROT 7.9  ALBUMIN  3.4*   No results for input(s): LIPASE, AMYLASE in the last 168 hours. No results for input(s): AMMONIA in the last 168 hours.  ABG    Component Value Date/Time   PHART 7.445 08/02/2012 1959   PCO2ART 39.8 08/02/2012 1959   PO2ART 94.5 08/02/2012 1959   HCO3 21.3 05/23/2023 1523   TCO2 28 02/21/2024 1350   ACIDBASEDEF 2.0 05/23/2023 1523   O2SAT 97 05/23/2023 1523     Coagulation Profile: No results for input(s): INR, PROTIME in the last 168 hours.  Cardiac Enzymes: No results for input(s): CKTOTAL, CKMB, CKMBINDEX, TROPONINI in the last 168 hours.  HbA1C: HbA1c, POC (controlled diabetic range)  Date/Time Value Ref Range Status  01/25/2024 09:01 AM 6.5 0.0 - 7.0 % Final  10/08/2023 03:09 PM 10.5 (A) 0.0 - 7.0 % Final    CBG: Recent Labs  Lab 02/25/24 1228  02/25/24 1704 02/25/24 2121 02/26/24 0607 02/26/24 1202  GLUCAP 162* 281* 168* 105* 133*      Harden Staff MD. FCCP. [facility] Pager : [phone]  If no response to pager , please call [phone] until 7 pm After 7:00 pm call Elink  [phone]   02/26/2024

## 2024-02-26 NOTE — Progress Notes (Signed)
 Physical Therapy Treatment Patient Details Name: Deborah [name] MRN: [id number] DOB: 06-Apr-1956 Today's Date: 02/26/2024   History of Present Illness Pt is 68 y/o female presenting on 9/8 with SOB due to COVID (noted presented to urgent care 9/7 but refused hospital admission).  Admitted with covid pneumonia. CXR with scattered interstitial and ground glass opacities. PMH: COPD/asthma, laryngectomy, HTN, GERD, hypothyroidism, depression, breast cancer, DM2.    PT Comments  Pt received in supine, pleasantly agreeable to therapy session and with improved standing/gait tolerance and balance this date. Pt needing up to Supervision for safety with gait trial longer household distance without AD, with min cues for activity pacing/safety and close monitoring of VS. Pt weaned to 4L/min trach mask at rest while standing at bedside but needed increase to FiO2 35%/6L per minute to maintain SpO2 88% and greater after ~172ft, then maintains [name] for remainder of trial. Pt returned to FiO2 50% 11L/min once back in supine and SpO2 92% on this level at rest, RN notified.     If plan is discharge home, recommend the following: A little help with bathing/dressing/bathroom;Help with stairs or ramp for entrance   Can travel by private vehicle        Equipment Recommendations  None recommended by PT    Recommendations for Other Services       Precautions / Restrictions Precautions Precautions: Fall Recall of Precautions/Restrictions: Intact Precaution/Restrictions Comments: trach w/mask, sometimes needs reminders to tighten cord keeping mask closer to stoma; Airborne/Contact precs Restrictions Weight Bearing Restrictions Per Provider Order: No     Mobility  Bed Mobility Overal bed mobility: Modified Independent Bed Mobility: Supine to Sit, Sit to Supine     Supine to sit: Modified independent (Device/Increase time), HOB elevated, Used rails Sit to supine: Modified independent (Device/Increase  time), HOB elevated, Used rails   General bed mobility comments: able to reposition in bed without assistance    Transfers Overall transfer level: Independent Equipment used: None Transfers: Sit to/from Stand Sit to Stand: Independent                Ambulation/Gait Ambulation/Gait assistance: Supervision Gait Distance (Feet): 300 Feet Assistive device: None Gait Pattern/deviations: Step-through pattern, Decreased stride length       General Gait Details: a few brief standing breaks while titrating O2 for amb sats test, no overt LOB or buckling but pt tending to lightly rest hand on counter while taking standing break. HR WFL and DOE 1/4.   Stairs             Wheelchair Mobility     Tilt Bed    Modified Rankin (Stroke Patients Only)       Balance Overall balance assessment: Needs assistance Sitting-balance support: No upper extremity supported, Feet supported Sitting balance-Leahy Scale: Normal     Standing balance support: No upper extremity supported, During functional activity, Single extremity supported Standing balance-Leahy Scale: Good Standing balance comment: no UE support for hallway amb, PTA reviewed she can use rail if needed but pt did not need rail today                            Communication Communication Communication: Impaired Factors Affecting Communication: Trach/intubated;Other (comment) (h/o laryngectomy with residual stoma, self occludes though still difficult to understand)  Cognition Arousal: Alert Behavior During Therapy: WFL for tasks assessed/performed   PT - Cognitive impairments: No apparent impairments  Following commands: Intact      Cueing Cueing Techniques: Verbal cues  Exercises Other Exercises Other Exercises: verbal cues for standing heel raises, reciprocal STS x10 with arms crossed, seated LAQ/marches TID x10-20 reps as tolerated, pt reports she's already been  doing this    General Comments General comments (skin integrity, edema, etc.): Pt weaned to RA and Desat on RA to 84%, improved to 85% on 4L/min trach mask, needed 6L/min FiO2 35% for gait in hallway for longer household distance to maintain SpO2 88% and greater.      Pertinent Vitals/Pain Pain Assessment Pain Assessment: No/denies pain    Home Living                          Prior Function            PT Goals (current goals can now be found in the care plan section) Acute Rehab PT Goals Patient Stated Goal: return to independent PT Goal Formulation: With patient/family Time For Goal Achievement: 03/08/24 Progress towards PT goals: Progressing toward goals    Frequency    Min 2X/week      PT Plan      Co-evaluation              AM-PAC PT 6 Clicks Mobility   Outcome Measure  Help needed turning from your back to your side while in a flat bed without using bedrails?: None Help needed moving from lying on your back to sitting on the side of a flat bed without using bedrails?: None Help needed moving to and from a bed to a chair (including a wheelchair)?: None Help needed standing up from a chair using your arms (e.g., wheelchair or bedside chair)?: None Help needed to walk in hospital room?: A Little (for activity pacing/O2 titration only) Help needed climbing 3-5 steps with a railing? : A Little 6 Click Score: 22    End of Session Equipment Utilized During Treatment: Gait belt;Oxygen  Activity Tolerance: Patient tolerated treatment well Patient left: in bed;with call bell/phone within reach;with family/visitor present;Other (comment) (spouse in room, not wearing a mask) Nurse Communication: Mobility status;Other (comment) (amb sats) PT Visit Diagnosis: Muscle weakness (generalized) (M62.81);Difficulty in walking, not elsewhere classified (R26.2)     Time: 8470-8445 PT Time Calculation (min) (ACUTE ONLY): 25 min  Charges:    $Gait Training:  8-22 mins $Therapeutic Activity: 8-22 mins PT General Charges $$ ACUTE PT VISIT: 1 Visit                     Briana Farner P., PTA Acute Rehabilitation Services Secure Chat Preferred 9a-5:30pm Office: [phone]    Connell HERO [facility] 02/26/2024, 4:39 PM

## 2024-02-26 NOTE — Plan of Care (Signed)
  Problem: Education: Goal: Ability to describe self-care measures that may prevent or decrease complications (Diabetes Survival Skills Education) will improve Outcome: Progressing   Problem: Fluid Volume: Goal: Ability to maintain a balanced intake and output will improve Outcome: Progressing   Problem: Health Behavior/Discharge Planning: Goal: Ability to manage health-related needs will improve Outcome: Progressing   Problem: Metabolic: Goal: Ability to maintain appropriate glucose levels will improve Outcome: Progressing

## 2024-02-26 NOTE — Discharge Summary (Shared)
 Family Medicine Teaching [facility] Discharge Summary  Patient name: Deborah [name] Medical record number: [id number] Date of birth: August 15, 1955 Age: 68 y.o. Gender: female Date of Admission: 02/22/2024  Date of Discharge: *** Admitting Physician: Raguel KANDICE Lee, DO  Primary Care Provider: Cleotilde Lukes, DO Consultants: critical care  Indication for Hospitalization: dyspnea  Discharge Diagnoses/Problem List:  Principal Problem for Admission: Acute hypoxic respiratory failure 2/2 COVID-19 Other Problems addressed during stay:  Principal Problem:   Acute hypoxic respiratory failure (HCC) Active Problems:   Acute hypoxemic respiratory failure due to COVID-19 [facility])   T2DM (type 2 diabetes mellitus) [facility])    Brief Hospital Course:  Deborah [name] is a 68 y.o.female with a history of COPD,  who was admitted to the [facility] Medicine Teaching Service at [facility] for AHRF in the setting of COVID. Her hospital course is detailed below:  AHRF  COVID Presented with new oxygen  requirement, positive COVID swab, and chest x-ray with atypical pneumonia.  Despite treatment, she had worsening respiratory distress and was transferred to ICU overnight.  She was continued on steroids and triple therapy nebulizers.  Also with brief metabolic encephalopathy for which she was placed on and tapered off of Precedex .  T2DM CBGs elevated in the setting of receiving steroids.  Insulin  was titrated over admission with long-acting and short acting insulin .  She was discharged on***.  Hypertension Briefly required labetalol  infusion as needed for elevated blood pressure in the ICU.  She was restarted on her home medications by discharge.  Hyperkalemia Potassium elevated to 5.8 in the ICU.  She received insulin  and bicarbonate with improvement in levels by discharge.  Other chronic conditions were medically managed with home medications and formulary alternatives as necessary (***)  PCP  Follow-up Recommendations:        Results/Tests Pending at Time of Discharge:  Unresulted Labs (From admission, onward)    None        Disposition: Home  Discharge Condition: Good  Discharge Exam:  Vitals:   02/26/24 0748 02/26/24 0834  BP: 127/69   Pulse: 70   Resp: 16 16  Temp: 98.3 F (36.8 C)   SpO2: 95%    Physical Exam: General: NAD. Well appearing Cardiovascular: regular rate and rhythm Respiratory: upper airway noise associated with tracheotomy. No wheezing or crackles. Distant breath sounds throughout.   Abdomen: nontender, nondistended.  Extremities: moving all limbs normally. No edema appreciated.   Significant Procedures: none  Significant Labs and Imaging:  Recent Labs  Lab 02/26/24 0309  WBC 6.4  HGB 13.2  HCT 40.7  PLT 366   Recent Labs  Lab 02/25/24 1212 02/26/24 0309  NA 132* 135  K 3.6 4.3  CL 94* 98  CO2 29 28  GLUCOSE 173* 117*  BUN 25* 23  CREATININE 1.19* 1.02*  CALCIUM  8.9 8.8*  ALKPHOS  --  83  AST  --  38  ALT  --  43  ALBUMIN   --  3.4*   Imaging DG Chest Port 1 View Result Date: 02/22/2024 CLINICAL DATA:  Short of breath, COVID positive EXAM: PORTABLE CHEST 1 VIEW COMPARISON:  02/21/2024 FINDINGS: Single frontal view of the chest demonstrates a stable cardiac silhouette. There is scattered interstitial and ground-glass opacities throughout the lungs, compatible with atypical pneumonia and consistent with given history of COVID positive. Dense consolidation and volume loss in the left lower lobe consistent with known atelectasis. No effusion or pneumothorax. No acute bony abnormalities. IMPRESSION: 1. Scattered interstitial and ground-glass  opacities consistent with atypical pneumonia, compatible with given history of COVID positive. 2. Chronic left lower lobe atelectasis. Electronically Signed   By: Ozell Daring M.D.   On: 02/22/2024 15:27   DG CHEST PORT 1 VIEW Result Date: 02/25/2024 CLINICAL DATA:  Dyspnea. EXAM:  PORTABLE CHEST 1 VIEW COMPARISON:  February 24, 2024. FINDINGS: Stable cardiomediastinal silhouette. Both lungs are clear. The visualized skeletal structures are unremarkable. IMPRESSION: No active disease. Electronically Signed   By: Lynwood Landy Raddle M.D.   On: 02/25/2024 13:09        Discharge Medications:  Allergies as of 02/26/2024       Reactions   Lisinopril  Cough     Med Rec must be completed prior to using this [facility]***       Discharge Instructions: Please refer to Patient Instructions section of EMR for full details.  Patient was counseled important signs and symptoms that should prompt return to medical care, changes in medications, dietary instructions, activity restrictions, and follow up appointments.   Follow-Up Appointments:  Follow-up Information     Care, [facility] Follow up.   Why: for home health services Contact information: [address] [place] [address] [phone]                 Benjamine Marsa DASEN, Medical Student 02/26/2024, 11:13 AM MS4 AI, [facility]

## 2024-02-26 NOTE — Progress Notes (Addendum)
 Daily Progress Note Intern Pager: [phone]  Patient name: Deborah [name] Medical record number: [id number] Date of birth: 10-15-1955 Age: 68 y.o. Gender: female  Primary Care Provider: Cleotilde Lukes, DO Consultants: Critical Care, PT/OT Code Status: Full  Pt Overview and Major Events to Date:  9/9: pt transferred to ICU 9/10: pt returned from ICU  Medical Decision Making:  Deborah [name] is a 68 y.o. female with a PMH of breast cancer, COPD, and HFrEF presenting with SOB and known COVID diagnosis from UC. Returning from ICU after reduced oxygen  requirement and stable VS.  Assessment & Plan Acute hypoxic respiratory failure (HCC) Acute hypoxemic respiratory failure due to COVID-19 Deborah [name]) Continuing to improve. Patient afebrile with heart rate and respiratory rate within normal limits.  Initial CXR showed scattered interstitial and ground glass opacities consistent with atypical pneumonia, compatible with clinical presentation given COVID-positive and chronic LLL atelectasis. Follow up CXR on 9/11 without active disease.   - Vital signs per floor - Tylenol  650 mg as needed every 6 hours - Dexamethasone  6 mg until discharge - Baricitinib  added by critical care (hepatitis panel ordered to r/o infection) until discharge - Zofran -ODT 4 mg Q8HR PRN  -  O2 supplementation with goal of 88-92% due to COPD history. Will monitor - PT/OT to treat - Ambulatory pulse ox ordered - VTE prophylaxis: Lovenox  50 mg - AM BMP T2DM (type 2 diabetes mellitus) (HCC) CBGs overall controlled while on steroids for above. - Continue Lantus  35 units daily - Resistant SSI Chronic [name] problem Anxiety - continue home Lexapro  20 mg, bupropion  24-hour 150 mg HTN - continue home losartan  50 mg, carvedilol  12.5 mg 2 times daily, consider altering regimen as needed Asthma - continue home montelukast  10 mg HLD - continue home rosuvastatin  40 mg COPD - continue home budesonide , formoterol ,  and yupelri  nebulizers    FEN/GI: Regular Diet PPx: Lovenox  Dispo:Home pending clinical improvement . Barriers include high supplemental O2 requirement.   Subjective:  Pt states she is eager to go home and is not able to rest well in the hospital. Asked if she could go home today. Advised patient that it is not safe for her to go home until we are able to wean her O2 requirement. She denies any fever, chest pain, abdominal pain, nausea, shortness of breath, or any other medical concerns at this time.   Objective: Temp:  [97.9 F (36.6 C)-98.8 F (37.1 C)] 98.1 F (36.7 C) (09/12 0419) Pulse Rate:  [53-100] 100 (09/12 0419) Resp:  [0-20] 14 (09/12 0419) BP: (118-168)/(61-123) 164/86 (09/12 0419) SpO2:  [90 %-99 %] 96 % (09/12 0419) FiO2 (%):  [50 %-70 %] 50 % (09/12 0419) Physical Exam: General: NAD. Well appearing Cardiovascular: regular rate and rhythm Respiratory: upper airway noise associated with tracheotomy. No wheezing or crackles. Distant breath sounds throughout.   Abdomen: nontender, nondistended.  Extremities: moving all limbs normally. No edema appreciated.   Laboratory: Most recent CBC Lab Results  Component Value Date   WBC 6.4 02/26/2024   HGB 13.2 02/26/2024   HCT 40.7 02/26/2024   MCV 91.1 02/26/2024   PLT 366 02/26/2024   Most recent BMP    Latest Ref Rng & Units 02/26/2024    3:09 AM  BMP  Glucose 70 - 99 mg/dL 882   BUN 8 - 23 mg/dL 23   Creatinine 9.55 - 1.00 mg/dL 8.97   Sodium 864 - 854 mmol/L 135   Potassium 3.5 - 5.1 mmol/L 4.3  Chloride 98 - 111 mmol/L 98   CO2 22 - 32 mmol/L 28   Calcium  8.9 - 10.3 mg/dL 8.8     Imaging/Diagnostic Tests: Radiologist Impression:  PORTABLE CHEST 1 VIEW  IMPRESSION: No active disease.  Benjamine Marsa DASEN, Medical Student 02/26/2024, 7:23 AM  MS4 AI, [facility] FPTS Intern pager: [phone], text pages welcome Secure chat group [facility] Teaching Service    I  agree with the assessment and plan as documented above.  Stuart Redo, MD PGY-3, [name]

## 2024-02-26 NOTE — Assessment & Plan Note (Addendum)
 CBGs overall controlled while on steroids for above. - Continue Lantus  35 units daily - Resistant SSI

## 2024-02-26 NOTE — Discharge Instructions (Signed)
 Thank you for letting us  care for you during your stay.  You were admitted to the [facility] Medicine Teaching Service.   You were admitted for difficulty breathing because of a COVID-19 infection. During your hospitalization you needed additional oxygen  for you to breath because of the effects of the virus on your lungs.  We recommend follow up specifically for your COVID infection. We want you to have your breathing checked at your follow up appointment. Please be sure to make this ASAP.  If your symptoms worsen or return, please return to the hospital.  Please let us  know if you have questions about your stay at [facility].

## 2024-02-27 DIAGNOSIS — U071 COVID-19: Secondary | ICD-10-CM | POA: Diagnosis not present

## 2024-02-27 DIAGNOSIS — J9601 Acute respiratory failure with hypoxia: Secondary | ICD-10-CM | POA: Diagnosis not present

## 2024-02-27 LAB — GLUCOSE, CAPILLARY
Glucose-Capillary: 108 mg/dL — ABNORMAL HIGH (ref 70–99)
Glucose-Capillary: 141 mg/dL — ABNORMAL HIGH (ref 70–99)
Glucose-Capillary: 115 mg/dL — ABNORMAL HIGH (ref 70–99)
Glucose-Capillary: 221 mg/dL — ABNORMAL HIGH (ref 70–99)
Glucose-Capillary: 200 mg/dL — ABNORMAL HIGH (ref 70–99)

## 2024-02-27 LAB — TSH: TSH: 9.426 u[IU]/mL — ABNORMAL HIGH (ref 0.350–4.500)

## 2024-02-27 MED ORDER — FUROSEMIDE 10 MG/ML IJ SOLN
20.0000 mg | Freq: Once | INTRAMUSCULAR | Status: AC
  Administered 2024-02-27: 20 mg via INTRAVENOUS
  Filled 2024-02-27: qty 2

## 2024-02-27 MED ORDER — LEVOTHYROXINE SODIUM 75 MCG PO TABS
225.0000 ug | ORAL_TABLET | Freq: Every day | ORAL | Status: DC
  Administered 2024-02-28: 225 ug via ORAL
  Filled 2024-02-27: qty 3

## 2024-02-27 MED ORDER — SODIUM CHLORIDE 3 % IN NEBU: 4.0000 mL | INHALATION_SOLUTION | Freq: Three times a day (TID) | RESPIRATORY_TRACT | Status: DC | PRN

## 2024-02-27 MED ORDER — LEVOTHYROXINE SODIUM 50 MCG PO TABS: 250.0000 ug | ORAL_TABLET | Freq: Every day | ORAL | Status: DC

## 2024-02-27 NOTE — Assessment & Plan Note (Addendum)
 CBGs overall controlled while on steroids for above. - Continue Lantus  35 units daily - Resistant SSI

## 2024-02-27 NOTE — Plan of Care (Signed)
  Problem: Education: Goal: Ability to describe self-care measures that may prevent or decrease complications (Diabetes Survival Skills Education) will improve Outcome: Progressing   Problem: Coping: Goal: Ability to adjust to condition or change in health will improve Outcome: Progressing   Problem: Fluid Volume: Goal: Ability to maintain a balanced intake and output will improve Outcome: Progressing   Problem: Metabolic: Goal: Ability to maintain appropriate glucose levels will improve Outcome: Progressing   Problem: Nutritional: Goal: Maintenance of adequate nutrition will improve Outcome: Progressing Goal: Progress toward achieving an optimal weight will improve Outcome: Progressing   Problem: Education: Goal: Knowledge of General Education information will improve Description: Including pain rating scale, medication(s)/side effects and non-pharmacologic comfort measures Outcome: Progressing

## 2024-02-27 NOTE — Assessment & Plan Note (Addendum)
 Ambulatory pulse ox from 9/12 showed saturation to 88% on 6L and FiO2 35%. Hypoxic on RA at rest and started 4L trach mask. Improving this AM, patient afebrile with HR and RR WNL.  Lowered oxygen  requirement to 2L, with no FiO2 this AM and patient tolerated well.   - Vital signs per floor - Tylenol  650 mg q6h prn - Dexamethasone  6 mg until discharge - Baricitinib  added by critical care, until discharge - Zofran -ODT 4 mg q8h prn  - O2 supplementation with goal of 88-92% due to COPD history, will monitor - PT/OT to treat, appreciate recommendations - Repeat ambulatory pulse ox  - DuoNebs ordered - Chest physiotherapy ordered - IV Lasix  20 mg  - AM BMP

## 2024-02-27 NOTE — Progress Notes (Signed)
 SATURATION QUALIFICATIONS: (This note is used to comply with regulatory documentation for home oxygen )  Patient Saturations on 6L at Rest 72  Patient Saturations on 6  Liters of oxygen  while Ambulating = 90%  Please briefly explain why patient needs home oxygen :. Pt ambulated without complication independently on 6 L about 150 meters.

## 2024-02-27 NOTE — Progress Notes (Addendum)
 Daily Progress Note Intern Pager: [phone]  Patient name: Deborah [name] Medical record number: [id number] Date of birth: 11/14/1955 Age: 68 y.o. Gender: female  Primary Care Provider: Cleotilde Lukes, DO Consultants: Critical Care, PT/OT Code Status: Full  Pt Overview and Major Events to Date:  9/9: pt transferred to ICU 9/10: pt returned from ICU  Medical Decision Making: Kelci Petrella is a 68 y.o. female with a PMH of breast cancer, COPD, and HFpEF presenting with SOB and known COVID diagnosis from UC. Returning from ICU after reduced oxygen  requirement and stable VS. Initial CXR showed scattered interstitial and ground glass opacities consistent with atypical pneumonia, compatible with clinical presentation given COVID-positive and chronic LLL atelectasis. Follow up CXR on 9/11 without active disease. She desaturated with ambulatory pulse ox, but is able to tolerate a decreased oxygen  requirement now at rest. Assessment & Plan Acute hypoxic respiratory failure (HCC) Acute hypoxemic respiratory failure due to COVID-19 [facility]) Ambulatory pulse ox from 9/12 showed saturation to 88% on 6L and FiO2 35%. Hypoxic on RA at rest and started 4L trach mask. Improving this AM, patient afebrile with HR and RR WNL.  Lowered oxygen  requirement to 2L, with no FiO2 this AM and patient tolerated well.   - Vital signs per floor - Tylenol  650 mg q6h prn - Dexamethasone  6 mg until discharge - Baricitinib  added by critical care, until discharge - Zofran -ODT 4 mg q8h prn  - O2 supplementation with goal of 88-92% due to COPD history, will monitor - PT/OT to treat, appreciate recommendations - Repeat ambulatory pulse ox  - DuoNebs ordered - Chest physiotherapy ordered - IV Lasix  20 mg  - AM BMP T2DM (type 2 diabetes mellitus) (HCC) CBGs overall controlled while on steroids for above. - Continue Lantus  35 units daily - Resistant SSI Chronic health problem Anxiety - continue home  Lexapro  20 mg, bupropion  24-hour 150 mg HTN - continue home losartan  50 mg, carvedilol  12.5 mg 2 times daily, consider altering regimen prn Asthma - continue home montelukast  10 mg HLD - continue home rosuvastatin  40 mg COPD - continue home budesonide , formoterol , and yupelri  nebulizers Hypothyroidism - TSH elevated to 9.426, currently on Levothyroxine  200 mcg daily will increase to 225 mcg daily   FEN/GI: Regular Diet  PPx: Lovenox  Dispo:Home pending clinical improvement . Barriers include supplemental O2 requirements, doesn't use any at baseline.   Subjective:  Pt states she is feeling well and is ready to go home. Reports normal work of breathing. Denies any medical concerns at this time.   Objective: Temp:  [98 F (36.7 C)-98.5 F (36.9 C)] 98 F (36.7 C) (09/13 0747) Pulse Rate:  [65-75] 68 (09/13 0747) Resp:  [14-20] 18 (09/13 0747) BP: (116-171)/(52-80) 136/78 (09/13 0747) SpO2:  [92 %-98 %] 97 % (09/13 0747) FiO2 (%):  [35 %-50 %] 40 % (09/13 0345) Physical Exam: General: Well appearing, listening to an audiobook, in no acute distress.  Cardiovascular: regular rate and rhythm with no murmurs noted Respiratory: distant breath sounds throughout. No wheezes or rhonchi present. Clear to auscultation Extremities: Moving all limbs normally.   Laboratory: Most recent CBC Lab Results  Component Value Date   WBC 6.4 02/26/2024   HGB 13.2 02/26/2024   HCT 40.7 02/26/2024   MCV 91.1 02/26/2024   PLT 366 02/26/2024   Most recent BMP    Latest Ref Rng & Units 02/26/2024    3:09 AM  BMP  Glucose 70 - 99 mg/dL 882  BUN 8 - 23 mg/dL 23   Creatinine 9.55 - 1.00 mg/dL 8.97   Sodium 864 - 854 mmol/L 135   Potassium 3.5 - 5.1 mmol/L 4.3   Chloride 98 - 111 mmol/L 98   CO2 22 - 32 mmol/L 28   Calcium  8.9 - 10.3 mg/dL 8.8     Casimir, Marsa DASEN, Medical Student 02/27/2024, 7:54 AM  MS4 AI, [facility] FPTS Intern pager: [phone], text pages  welcome Secure chat group [facility] Teaching Service   I have discussed the above with Student Doctor Casimir and agree with the documented plan. My edits for correction/addition/clarification are included above. Please see any attending notes.   Kathrine Melena, DO PGY-2, [facility] 02/27/2024 12:15 PM

## 2024-02-27 NOTE — Assessment & Plan Note (Addendum)
 Anxiety - continue home Lexapro  20 mg, bupropion  24-hour 150 mg HTN - continue home losartan  50 mg, carvedilol  12.5 mg 2 times daily, consider altering regimen prn Asthma - continue home montelukast  10 mg HLD - continue home rosuvastatin  40 mg COPD - continue home budesonide , formoterol , and yupelri  nebulizers Hypothyroidism - TSH elevated to 9.426, currently on Levothyroxine  200 mcg daily will increase to 225 mcg daily

## 2024-02-28 ENCOUNTER — Inpatient Hospital Stay (HOSPITAL_COMMUNITY)

## 2024-02-28 ENCOUNTER — Telehealth: Payer: Self-pay | Admitting: Pulmonary Disease

## 2024-02-28 ENCOUNTER — Other Ambulatory Visit (HOSPITAL_COMMUNITY): Payer: Self-pay

## 2024-02-28 DIAGNOSIS — U071 COVID-19: Secondary | ICD-10-CM | POA: Diagnosis not present

## 2024-02-28 DIAGNOSIS — J9601 Acute respiratory failure with hypoxia: Secondary | ICD-10-CM | POA: Diagnosis not present

## 2024-02-28 LAB — BASIC METABOLIC PANEL WITH GFR
Anion gap: 13 (ref 5–15)
BUN: 24 mg/dL — ABNORMAL HIGH (ref 8–23)
CO2: 23 mmol/L (ref 22–32)
Calcium: 8.8 mg/dL — ABNORMAL LOW (ref 8.9–10.3)
Chloride: 101 mmol/L (ref 98–111)
Creatinine, Ser: 0.99 mg/dL (ref 0.44–1.00)
GFR, Estimated: >60 mL/min (ref >60)
Glucose, Bld: 130 mg/dL — ABNORMAL HIGH (ref 70–99)
Potassium: 4.3 mmol/L (ref 3.5–5.1)
Sodium: 137 mmol/L (ref 135–145)

## 2024-02-28 LAB — GLUCOSE, CAPILLARY
Glucose-Capillary: 104 mg/dL — ABNORMAL HIGH (ref 70–99)
Glucose-Capillary: 159 mg/dL — ABNORMAL HIGH (ref 70–99)

## 2024-02-28 MED ORDER — LEVOTHYROXINE SODIUM 75 MCG PO TABS
225.0000 ug | ORAL_TABLET | Freq: Every day | ORAL | 0 refills | Status: DC
  Filled 2024-02-28: qty 90, 30d supply, fill #0

## 2024-02-28 MED ORDER — ESCITALOPRAM OXALATE 20 MG PO TABS
20.0000 mg | ORAL_TABLET | Freq: Every day | ORAL | 1 refills | Status: DC
  Filled 2024-02-28: qty 90, 90d supply, fill #0

## 2024-02-28 NOTE — Assessment & Plan Note (Signed)
 Back on baseline no Oxygen  supplement. 21% Oxygen  humidifier for trach mask.Afebrile with HR and RR WNL.   - Vital signs per floor - Tylenol  650 mg q6h prn - Dexamethasone  6 mg until discharge - Baricitinib  added by critical care, until discharge - Zofran -ODT 4 mg q8h prn  - O2 supplementation with goal of 88-92% due to COPD history, will monitor - PT/OT to treat, appreciate recommendations - DuoNebs ordered - Chest physiotherapy ordered - AM BMP

## 2024-02-28 NOTE — Telephone Encounter (Signed)
 Needs hospital follow-up appointment 2 to 4 weeks with APP/Dr. Kara

## 2024-02-28 NOTE — Assessment & Plan Note (Signed)
 CBGs overall controlled while on steroids  - Continue Lantus  35 units daily - Resistant SSI

## 2024-02-28 NOTE — Progress Notes (Signed)
   02/28/24 0810  Therapy Vitals  Pulse Rate 70  Resp 15  MEWS Score/Color  MEWS Score 0  MEWS Score Color Green  Oxygen  Therapy/Pulse Ox  O2 Device Tracheostomy Collar  O2 Therapy (S)  Room air humidified  O2 Flow Rate (L/min) (S)  6 L/min  FiO2 (%) (S)  21 %  SpO2 94 %   Pt placed on Room Air through trach collar VS stable at this time. Will continue to monitor O2 requirements.

## 2024-02-28 NOTE — Discharge Summary (Addendum)
 Family Medicine Teaching [facility] Discharge Summary  Patient name: Deborah [name] Medical record number: [id number] Date of birth: 15-Jul-1955 Age: 68 y.o. Gender: female Date of Admission: 02/22/2024  Date of Discharge: 02/28/24 Admitting Physician: Raguel KANDICE Lee, DO  Primary Care Provider: Cleotilde Lukes, DO Consultants: Pulmonary/Critical Care  Indication for Hospitalization: SOB w/ (+) COVID  Discharge Diagnoses/Problem List:  Principal Problem for Admission:  Other Problems addressed during stay:  Principal Problem:   Acute hypoxic respiratory failure (HCC) Active Problems:   Acute hypoxemic respiratory failure due to COVID-19 [facility])   T2DM (type 2 diabetes mellitus) [facility])  Brief Hospital Course:  Deborah [name] is a 68 y.o.female with a history of COPD,  who was admitted to the [facility] Medicine Teaching Service at [facility] for AHRF in the setting of COVID. Her hospital course is detailed below:  AHRF  COVID Presented with new oxygen  requirement, positive COVID swab, and chest x-ray with atypical pneumonia. She had worsening respiratory distress and was transferred to ICU overnight.  She was continued on steroids and triple therapy nebulizers. Had an episode of brief metabolic encephalopathy for which she was placed on and tapered off of Precedex . She left the ICU on 9/10 in no respiratory distress and was weaned off of supplemental oxygen  by her discharge on 02/28/24. She was also started on Baracitinib over hospitalization for COVID.  Given improvement without oxygen  requirement when ambulating on room air, baricitinib  and steroid courses were discontinued at discharge.  T2DM CBGs elevated in the setting of receiving steroids.  Insulin  was titrated over admission with long-acting and short acting insulin . She was discharged on home diabetic regimen.  HTN Briefly required labetalol  infusion as needed for elevated blood pressure in the ICU.  She was restarted on  her home medications prior to discharge and remained stable.  Hyperkalemia Potassium elevated to 5.8 in the ICU.  She received insulin  and bicarbonate with improvement in levels by discharge.  Hypothyroidism TSH elevated to 9.426. Increased levothyroxine  to 200 mcg daily to 225 mcg daily.  Other chronic conditions were medically managed with home medications and formulary alternatives as necessary (Anxiety, asthma, HLD, COPD)  PCP Follow-up Recommendations: Recheck TSH in 4-6 weeks, and titrate as needed. Assess continued improvement in respiratory status  Disposition: Home   Discharge Condition: Good, back to normal baseline  Discharge Exam:  Vitals:   02/28/24 1133 02/28/24 1311  BP: (!) 156/74   Pulse: 69 75  Resp: (!) 25 15  Temp: 98.3 F (36.8 C)   SpO2: 95% 95%   Physical Exam: General: Well appearing, listening to an audiobook, in no acute distress.  Cardiovascular: regular rate and rhythm with no murmurs noted Respiratory: distant breath sounds throughout. No wheezes or rhonchi present. Clear to auscultation Extremities: Moving all limbs normally.   Significant Procedures: None  Significant Labs and Imaging:      Latest Ref Rng & Units 02/26/2024    3:09 AM 02/24/2024    4:11 AM 02/22/2024    3:32 PM  CBC  WBC 4.0 - 10.5 K/uL 6.4  9.5  8.0   Hemoglobin 12.0 - 15.0 g/dL 86.7  87.0  87.2   Hematocrit 36.0 - 46.0 % 40.7  41.0  40.4   Platelets 150 - 400 K/uL 366  404  325     Recent Labs  Lab 02/28/24 0236  NA 137  K 4.3  CL 101  CO2 23  GLUCOSE 130*  BUN 24*  CREATININE 0.99  CALCIUM   8.8*    Pertinent Imaging    EXAM: PORTABLE CHEST 1 VIEW   COMPARISON:  Portable chest 02/25/2024 and earlier. IMPRESSION: Chronic left lower lobe collapse. No acute cardiopulmonary abnormality.  Discharge Medications:  Allergies as of 02/28/2024       Reactions   Lisinopril  Cough        Medication List     STOP taking these medications    predniSONE   20 MG tablet Commonly known as: DELTASONE        TAKE these medications    amphetamine-dextroamphetamine 20 MG tablet Commonly known as: ADDERALL Take 20 mg by mouth 2 (two) times daily.   BD Pen Needle Nano 2nd Gen 32G X 4 MM Misc Generic drug: Insulin  Pen Needle Inject 1 each into the skin 4 (four) times daily.   budesonide  0.5 MG/2ML nebulizer solution Commonly known as: PULMICORT  Take 2 mLs (0.5 mg total) by nebulization daily.   buPROPion  150 MG 24 hr tablet Commonly known as: WELLBUTRIN  XL TAKE 1 TABLET BY MOUTH DAILY   carvedilol  12.5 MG tablet Commonly known as: COREG  Take 1 tablet (12.5 mg total) by mouth 2 (two) times daily with a meal.   Dexcom G7 Receiver Devi Apply 1 each topically See admin instructions.   Dexcom G7 Sensor Misc APPLY NEW SENSOR EVERY 10 DAYS   EQ Allergy Relief (Cetirizine ) 10 MG tablet Generic drug: cetirizine  Take 1 tablet by mouth once daily   escitalopram  20 MG tablet Commonly known as: LEXAPRO  Take 1 tablet (20 mg total) by mouth daily.   fluticasone  50 MCG/ACT nasal spray Commonly known as: FLONASE  Place 1 spray into both nostrils 2 (two) times daily.   formoterol  20 MCG/2ML nebulizer solution Commonly known as: PERFOROMIST  Take 2 mLs (20 mcg total) by nebulization 2 (two) times daily.   Humidifiers Misc 1 application by Tracheal Tube route as needed.   insulin  lispro 100 UNIT/ML KwikPen Commonly known as: HumaLOG  KwikPen Inject 10 Units into the skin 3 (three) times daily.   Lantus  SoloStar 100 UNIT/ML Solostar Pen Generic drug: insulin  glargine INJECT SUBCUTANEOUSLY 35 UNITS  DAILY   levothyroxine  75 MCG tablet Commonly known as: SYNTHROID  Take 3 tablets (225 mcg total) by mouth daily at 6 (six) AM. What changed:  medication strength how much to take when to take this   losartan  50 MG tablet Commonly known as: COZAAR  Take 1 tablet (50 mg total) by mouth daily.   metFORMIN  500 MG 24 hr tablet Commonly known  as: GLUCOPHAGE -XR TAKE 1 TABLET BY MOUTH DAILY  WITH BREAKFAST   montelukast  10 MG tablet Commonly known as: SINGULAIR  TAKE 1 TABLET BY MOUTH AT BEDTIME   nystatin  cream Commonly known as: MYCOSTATIN  Apply 1 Application topically daily as needed (rash under breast stomach).   ondansetron  4 MG tablet Commonly known as: Zofran  Take 1 tablet (4 mg total) by mouth every 8 (eight) hours as needed for nausea or vomiting.   revefenacin  175 MCG/3ML nebulizer solution Commonly known as: YUPELRI  Take 3 mLs (175 mcg total) by nebulization daily.   rosuvastatin  40 MG tablet Commonly known as: CRESTOR  TAKE 1 TABLET BY MOUTH DAILY   Semaglutide (0.25 or 0.5MG /DOS) 2 MG/3ML Sopn Inject 0.5 mg into the skin once a week.   sodium chloride  HYPERTONIC 3 % nebulizer solution Take by nebulization 3 (three) times daily.        Discharge Instructions: Please refer to Patient Instructions section of EMR for full details.  Patient was counseled important signs and symptoms that  should prompt return to medical care, changes in medications, dietary instructions, activity restrictions, and follow up appointments.   Follow-Up Appointments:  Follow-up Information     Care, [facility] Follow up.   Why: for home health services Contact information: [address] [place] [address] [phone]         Cleotilde Lukes, DO. Schedule an appointment as soon as possible for a visit.   Specialty: Family Medicine Why: ASAP for hospital follow up Contact information: [address] [place] [address] [phone]                 Suzen Houston NOVAK, DO 02/28/2024, 1:26 PM PGY-1, [facility] Family Medicine   I agree with the assessment and plan as documented above.  Stuart Redo, MD PGY-3, [facility] Family Medicine

## 2024-02-28 NOTE — Plan of Care (Signed)

## 2024-02-28 NOTE — Plan of Care (Signed)
  Problem: Education: Goal: Ability to describe self-care measures that may prevent or decrease complications (Diabetes Survival Skills Education) will improve Outcome: Progressing   Problem: Coping: Goal: Ability to adjust to condition or change in health will improve Outcome: Progressing   Problem: Fluid Volume: Goal: Ability to maintain a balanced intake and output will improve Outcome: Progressing   Problem: Health Behavior/Discharge Planning: Goal: Ability to identify and utilize available resources and services will improve Outcome: Progressing Goal: Ability to manage health-related needs will improve Outcome: Progressing   Problem: Metabolic: Goal: Ability to maintain appropriate glucose levels will improve Outcome: Progressing   Problem: Nutritional: Goal: Maintenance of adequate nutrition will improve Outcome: Progressing   Problem: Skin Integrity: Goal: Risk for impaired skin integrity will decrease Outcome: Progressing   Problem: Tissue Perfusion: Goal: Adequacy of tissue perfusion will improve Outcome: Progressing   Problem: Education: Goal: Knowledge of General Education information will improve Description: Including pain rating scale, medication(s)/side effects and non-pharmacologic comfort measures Outcome: Progressing   Problem: Health Behavior/Discharge Planning: Goal: Ability to manage health-related needs will improve Outcome: Progressing   Problem: Clinical Measurements: Goal: Ability to maintain clinical measurements within normal limits will improve Outcome: Progressing Goal: Will remain free from infection Outcome: Progressing Goal: Diagnostic test results will improve Outcome: Progressing Goal: Respiratory complications will improve Outcome: Progressing Goal: Cardiovascular complication will be avoided Outcome: Progressing   Problem: Activity: Goal: Risk for activity intolerance will decrease Outcome: Progressing   Problem:  Nutrition: Goal: Adequate nutrition will be maintained Outcome: Progressing   Problem: Coping: Goal: Level of anxiety will decrease Outcome: Progressing   Problem: Elimination: Goal: Will not experience complications related to bowel motility Outcome: Progressing Goal: Will not experience complications related to urinary retention Outcome: Progressing   Problem: Pain Managment: Goal: General experience of comfort will improve and/or be controlled Outcome: Progressing   Problem: Safety: Goal: Ability to remain free from injury will improve Outcome: Progressing   Problem: Skin Integrity: Goal: Risk for impaired skin integrity will decrease Outcome: Progressing   Problem: Nutritional: Goal: Progress toward achieving an optimal weight will improve Outcome: Not Progressing

## 2024-02-28 NOTE — Progress Notes (Signed)
 Pt was ambulating per order. Pt's O2 stayed between 91SpO2-95SpO2 during ambulation in the hallway HR stayed 60's-80's. No reports of ShOB or needing to take breaks during ambulation. Deborah [name] S Zachariah Pavek

## 2024-02-28 NOTE — Assessment & Plan Note (Signed)
 Anxiety - continue home Lexapro  20 mg, bupropion  24-hour 150 mg HTN - continue home losartan  50 mg, carvedilol  12.5 mg 2 times daily, consider altering regimen prn Asthma - continue home montelukast  10 mg HLD - continue home rosuvastatin  40 mg COPD - continue home budesonide , formoterol , and yupelri  nebulizers Hypothyroidism - TSH elevated to 9.426, currently on Levothyroxine  200 mcg daily will increase to 225 mcg daily

## 2024-02-28 NOTE — Progress Notes (Signed)
 Daily Progress Note Intern Pager: [phone]  Patient name: Deborah [name] Medical record number: [id number] Date of birth: 1955/11/22 Age: 68 y.o. Gender: female  Primary Care Provider: Cleotilde Lukes, DO Consultants: Critical Care, PT/OT Code Status: Full  Pt Overview and Major Events to Date:  9/8 : Admitted for SOB and + COVID 9/9: pt transferred to ICU 9/10: pt returned from ICU  Assessment Deborah [name] is a 68 y.o. female with a PMH of COPD with no home supplemental O2 requirement, h/o laryngeal cancer s/p laryngectomy and radiation, HTN, HLD, CHF, T2DM, obesity, hypothyroidism  breast cancer, GERD and HFpEF presenting with SOB and known COVID ( Dx at Urgent Care). Her hospitalization was complicated by increased oxygen  requirement required ICU admission. Patient is now doing well and medically ready for discharge.   Assessment & Plan Acute hypoxic respiratory failure (HCC) Acute hypoxemic respiratory failure due to COVID-19 (HCC) Back on baseline no Oxygen  supplement. 21% Oxygen  humidifier for trach mask.Afebrile with HR and RR WNL.   - Vital signs per floor - Tylenol  650 mg q6h prn - Dexamethasone  6 mg until discharge - Baricitinib  added by critical care, until discharge - Zofran -ODT 4 mg q8h prn  - O2 supplementation with goal of 88-92% due to COPD history, will monitor - PT/OT to treat, appreciate recommendations - DuoNebs ordered - Chest physiotherapy ordered - AM BMP T2DM (type 2 diabetes mellitus) (HCC) CBGs overall controlled while on steroids  - Continue Lantus  35 units daily - Resistant SSI Chronic health problem Anxiety - continue home Lexapro  20 mg, bupropion  24-hour 150 mg HTN - continue home losartan  50 mg, carvedilol  12.5 mg 2 times daily, consider altering regimen prn Asthma - continue home montelukast  10 mg HLD - continue home rosuvastatin  40 mg COPD - continue home budesonide , formoterol , and yupelri  nebulizers Hypothyroidism -  TSH elevated to 9.426, currently on Levothyroxine  200 mcg daily will increase to 225 mcg daily   FEN/GI: Regular Diet  PPx: Lovenox  Dispo:Home pending clinical improvement . Barriers include supplemental O2 requirements, doesn't use any at baseline.   Subjective:  Pt states she is feeling well and is ready to go home. Reports normal work of breathing. States her husband can pick her up after church today around 2pm.  Objective: Temp:  [98 F (36.7 C)-98.3 F (36.8 C)] 98.3 F (36.8 C) (09/14 0502) Pulse Rate:  [65-78] 70 (09/14 0810) Resp:  [12-19] 15 (09/14 0810) BP: (108-169)/(63-88) 152/71 (09/14 0502) SpO2:  [92 %-98 %] 94 % (09/14 0810) FiO2 (%):  [21 %-35 %] 21 % (09/14 0810) Physical Exam: General: Well appearing, listening to an audiobook, in no acute distress.  Cardiovascular: regular rate and rhythm with no murmurs noted Respiratory: distant breath sounds throughout. No wheezes or rhonchi present. Clear to auscultation Extremities: Moving all limbs normally.   Laboratory: Most recent CBC Lab Results  Component Value Date   WBC 6.4 02/26/2024   HGB 13.2 02/26/2024   HCT 40.7 02/26/2024   MCV 91.1 02/26/2024   PLT 366 02/26/2024   Most recent BMP    Latest Ref Rng & Units 02/28/2024    2:36 AM  BMP  Glucose 70 - 99 mg/dL 869   BUN 8 - 23 mg/dL 24   Creatinine 9.55 - 1.00 mg/dL 9.00   Sodium 864 - 854 mmol/L 137   Potassium 3.5 - 5.1 mmol/L 4.3   Chloride 98 - 111 mmol/L 101   CO2 22 - 32 mmol/L 23  Calcium  8.9 - 10.3 mg/dL 8.8      Houston Samuels, DO PGY-2, [facility] Family Medicine 02/28/2024 9:09 AM

## 2024-02-29 ENCOUNTER — Telehealth: Payer: Self-pay

## 2024-02-29 NOTE — Telephone Encounter (Signed)
 Patient called and scheduled.

## 2024-02-29 NOTE — Transitions of Care (Post Inpatient/ED Visit) (Signed)
   02/29/2024  Name: Deborah [name] MRN: [id number] DOB: Oct 31, 1955  Today's TOC FU Call Status: Today's TOC FU Call Status:: Unsuccessful Call (1st Attempt) Unsuccessful Call (1st Attempt) Date: 02/29/24  Attempted to reach the patient regarding the most recent Inpatient/ED visit.  Follow Up Plan: Additional outreach attempts will be made to reach the patient to complete the Transitions of Care (Post Inpatient/ED visit) call.   Arvin Seip RN, BSN, CCM [employer], Population Health Case Manager Phone: [phone]

## 2024-03-01 ENCOUNTER — Telehealth: Payer: Self-pay

## 2024-03-01 NOTE — Transitions of Care (Post Inpatient/ED Visit) (Signed)
   03/01/2024  Name: Deborah [name] MRN: [id number] DOB: January 25, 1956  Today's TOC FU Call Status: Today's TOC FU Call Status:: Unsuccessful Call (2nd Attempt) Unsuccessful Call (2nd Attempt) Date: 03/01/24  Attempted to reach the patient regarding the most recent Inpatient/ED visit.  Follow Up Plan: Additional outreach attempts will be made to reach the patient to complete the Transitions of Care (Post Inpatient/ED visit) call.   Alan Ee, RN, BSN, CEN [employer]- Transition of Care Team.  [facility] [phone]

## 2024-03-02 ENCOUNTER — Telehealth: Payer: Self-pay

## 2024-03-02 NOTE — Transitions of Care (Post Inpatient/ED Visit) (Signed)
   03/02/2024  Name: Calyn Sivils MRN: [id number] DOB: December 02, 1955  Today's TOC FU Call Status: Today's TOC FU Call Status:: Unsuccessful Call (3rd Attempt) Unsuccessful Call (3rd Attempt) Date: 03/02/24  Attempted to reach the patient regarding the most recent Inpatient/ED visit.  Follow Up Plan: No further outreach attempts will be made at this time. We have been unable to contact the patient.  Alan Ee, RN, BSN, CEN [employer]- Transition of Care Team.  [facility] [phone]

## 2024-03-03 ENCOUNTER — Telehealth: Payer: Self-pay

## 2024-03-03 NOTE — Telephone Encounter (Signed)
 Patient calls nurse line regarding request for new prescriptions for trach supplies.   Patient reports that she uses [facility] for supplies.   Called over to [facility]. [facility] advised that pulmonologist has been prescribing these items.   [facility] is going to reach out to Pulmonology office.   Called patient and advised of update. She can reach out to pulmonology office for further updates on order.   Chiquita JAYSON English, RN

## 2024-03-04 ENCOUNTER — Telehealth: Payer: Self-pay | Admitting: Family Medicine

## 2024-03-04 DIAGNOSIS — J449 Chronic obstructive pulmonary disease, unspecified: Secondary | ICD-10-CM

## 2024-03-04 NOTE — Telephone Encounter (Unsigned)
 Copied from CRM #[id number]. Topic: Clinical - Medication Refill >> Mar 03, 2024 11:47 AM Nathanel DEL wrote: Medication: pt states she needs Rx for her compressor, goes over trach.  Mask that goes over trach.  She takes it on/off depending on what she is using. She said she thinks Dr Kara was the last to order from [facility]  Has the patient contacted their pharmacy? No Pt does not know who to call  Has the prescription been filled recently? No  Is the patient out of the medication? N/A  Has the patient been seen for an appointment in the last year OR does the patient have an upcoming appointment? Yes  Can we respond through MyChart? No  Was unsure who/how to send this request.  Spoke w/ NT and they helped advise.

## 2024-03-05 ENCOUNTER — Encounter (HOSPITAL_COMMUNITY): Payer: Self-pay | Admitting: *Deleted

## 2024-03-05 ENCOUNTER — Emergency Department (HOSPITAL_COMMUNITY)

## 2024-03-05 ENCOUNTER — Inpatient Hospital Stay (HOSPITAL_COMMUNITY)
Admission: EM | Admit: 2024-03-05 | Discharge: 2024-03-08 | DRG: 177 | Disposition: A | Discharge status: Home Health | Attending: Family Medicine | Admitting: Family Medicine

## 2024-03-05 ENCOUNTER — Other Ambulatory Visit: Payer: Self-pay

## 2024-03-05 DIAGNOSIS — Z8521 Personal history of malignant neoplasm of larynx: Secondary | ICD-10-CM

## 2024-03-05 DIAGNOSIS — Z794 Long term (current) use of insulin: Secondary | ICD-10-CM

## 2024-03-05 DIAGNOSIS — Z930 Tracheostomy status: Secondary | ICD-10-CM | POA: Diagnosis not present

## 2024-03-05 DIAGNOSIS — E039 Hypothyroidism, unspecified: Secondary | ICD-10-CM | POA: Diagnosis present

## 2024-03-05 DIAGNOSIS — E785 Hyperlipidemia, unspecified: Secondary | ICD-10-CM | POA: Diagnosis not present

## 2024-03-05 DIAGNOSIS — E1122 Type 2 diabetes mellitus with diabetic chronic kidney disease: Secondary | ICD-10-CM | POA: Diagnosis not present

## 2024-03-05 DIAGNOSIS — N1831 Chronic kidney disease, stage 3a: Secondary | ICD-10-CM | POA: Diagnosis present

## 2024-03-05 DIAGNOSIS — Z1152 Encounter for screening for COVID-19: Secondary | ICD-10-CM

## 2024-03-05 DIAGNOSIS — Z87891 Personal history of nicotine dependence: Secondary | ICD-10-CM

## 2024-03-05 DIAGNOSIS — I3139 Other pericardial effusion (noninflammatory): Secondary | ICD-10-CM | POA: Diagnosis not present

## 2024-03-05 DIAGNOSIS — I130 Hypertensive heart and chronic kidney disease with heart failure and stage 1 through stage 4 chronic kidney disease, or unspecified chronic kidney disease: Secondary | ICD-10-CM | POA: Diagnosis not present

## 2024-03-05 DIAGNOSIS — R0902 Hypoxemia: Principal | ICD-10-CM

## 2024-03-05 DIAGNOSIS — J90 Pleural effusion, not elsewhere classified: Secondary | ICD-10-CM | POA: Diagnosis not present

## 2024-03-05 DIAGNOSIS — R059 Cough, unspecified: Secondary | ICD-10-CM | POA: Diagnosis not present

## 2024-03-05 DIAGNOSIS — Z79899 Other long term (current) drug therapy: Secondary | ICD-10-CM | POA: Diagnosis not present

## 2024-03-05 DIAGNOSIS — R918 Other nonspecific abnormal finding of lung field: Secondary | ICD-10-CM | POA: Diagnosis not present

## 2024-03-05 DIAGNOSIS — R042 Hemoptysis: Secondary | ICD-10-CM | POA: Diagnosis present

## 2024-03-05 DIAGNOSIS — K219 Gastro-esophageal reflux disease without esophagitis: Secondary | ICD-10-CM | POA: Diagnosis present

## 2024-03-05 DIAGNOSIS — Z833 Family history of diabetes mellitus: Secondary | ICD-10-CM | POA: Diagnosis not present

## 2024-03-05 DIAGNOSIS — J439 Emphysema, unspecified: Secondary | ICD-10-CM | POA: Diagnosis not present

## 2024-03-05 DIAGNOSIS — Z789 Other specified health status: Secondary | ICD-10-CM

## 2024-03-05 DIAGNOSIS — J4489 Other specified chronic obstructive pulmonary disease: Secondary | ICD-10-CM | POA: Diagnosis present

## 2024-03-05 DIAGNOSIS — Z7984 Long term (current) use of oral hypoglycemic drugs: Secondary | ICD-10-CM

## 2024-03-05 DIAGNOSIS — Z7951 Long term (current) use of inhaled steroids: Secondary | ICD-10-CM

## 2024-03-05 DIAGNOSIS — Z8249 Family history of ischemic heart disease and other diseases of the circulatory system: Secondary | ICD-10-CM | POA: Diagnosis not present

## 2024-03-05 DIAGNOSIS — J9601 Acute respiratory failure with hypoxia: Secondary | ICD-10-CM | POA: Diagnosis not present

## 2024-03-05 DIAGNOSIS — J690 Pneumonitis due to inhalation of food and vomit: Principal | ICD-10-CM | POA: Diagnosis present

## 2024-03-05 DIAGNOSIS — Z7985 Long-term (current) use of injectable non-insulin antidiabetic drugs: Secondary | ICD-10-CM | POA: Diagnosis not present

## 2024-03-05 DIAGNOSIS — Z79890 Hormone replacement therapy (postmenopausal): Secondary | ICD-10-CM

## 2024-03-05 DIAGNOSIS — Z96653 Presence of artificial knee joint, bilateral: Secondary | ICD-10-CM | POA: Diagnosis present

## 2024-03-05 DIAGNOSIS — F419 Anxiety disorder, unspecified: Secondary | ICD-10-CM | POA: Diagnosis present

## 2024-03-05 DIAGNOSIS — I5032 Chronic diastolic (congestive) heart failure: Secondary | ICD-10-CM | POA: Diagnosis present

## 2024-03-05 DIAGNOSIS — R0781 Pleurodynia: Secondary | ICD-10-CM | POA: Diagnosis not present

## 2024-03-05 DIAGNOSIS — Z923 Personal history of irradiation: Secondary | ICD-10-CM

## 2024-03-05 DIAGNOSIS — Z8616 Personal history of COVID-19: Secondary | ICD-10-CM

## 2024-03-05 DIAGNOSIS — J0410 Acute tracheitis without obstruction: Secondary | ICD-10-CM | POA: Diagnosis not present

## 2024-03-05 DIAGNOSIS — R011 Cardiac murmur, unspecified: Secondary | ICD-10-CM

## 2024-03-05 DIAGNOSIS — J441 Chronic obstructive pulmonary disease with (acute) exacerbation: Secondary | ICD-10-CM

## 2024-03-05 DIAGNOSIS — R0602 Shortness of breath: Secondary | ICD-10-CM | POA: Diagnosis not present

## 2024-03-05 DIAGNOSIS — J449 Chronic obstructive pulmonary disease, unspecified: Secondary | ICD-10-CM | POA: Diagnosis present

## 2024-03-05 DIAGNOSIS — Z853 Personal history of malignant neoplasm of breast: Secondary | ICD-10-CM

## 2024-03-05 LAB — COMPREHENSIVE METABOLIC PANEL WITH GFR
ALT: 32 U/L (ref 0–44)
AST: 34 U/L (ref 15–41)
Albumin: 3.5 g/dL (ref 3.5–5.0)
Alkaline Phosphatase: 97 U/L (ref 38–126)
Anion gap: 8 (ref 5–15)
BUN: 16 mg/dL (ref 8–23)
CO2: 28 mmol/L (ref 22–32)
Calcium: 8.5 mg/dL — ABNORMAL LOW (ref 8.9–10.3)
Chloride: 102 mmol/L (ref 98–111)
Creatinine, Ser: 0.92 mg/dL (ref 0.44–1.00)
GFR, Estimated: >60 mL/min (ref >60)
Glucose, Bld: 133 mg/dL — ABNORMAL HIGH (ref 70–99)
Potassium: 4.6 mmol/L (ref 3.5–5.1)
Sodium: 138 mmol/L (ref 135–145)
Total Bilirubin: 0.5 mg/dL (ref 0.0–1.2)
Total Protein: 8 g/dL (ref 6.5–8.1)

## 2024-03-05 LAB — CBC WITH DIFFERENTIAL/PLATELET
Abs Immature Granulocytes: 0.06 K/uL (ref 0.00–0.07)
Basophils Absolute: 0.1 K/uL (ref 0.0–0.1)
Basophils Relative: 1 %
Eosinophils Absolute: 0.2 K/uL (ref 0.0–0.5)
Eosinophils Relative: 2 %
HCT: 38.5 % (ref 36.0–46.0)
Hemoglobin: 12 g/dL (ref 12.0–15.0)
Immature Granulocytes: 1 %
Lymphocytes Relative: 21 %
Lymphs Abs: 1.7 K/uL (ref 0.7–4.0)
MCH: 29.6 pg (ref 26.0–34.0)
MCHC: 31.2 g/dL (ref 30.0–36.0)
MCV: 95.1 fL (ref 80.0–100.0)
Monocytes Absolute: 0.6 K/uL (ref 0.1–1.0)
Monocytes Relative: 8 %
Neutro Abs: 5.4 K/uL (ref 1.7–7.7)
Neutrophils Relative %: 67 %
Platelets: 398 K/uL (ref 150–400)
RBC: 4.05 MIL/uL (ref 3.87–5.11)
RDW: 13.6 % (ref 11.5–15.5)
Smear Review: NORMAL
WBC: 8 K/uL (ref 4.0–10.5)
nRBC: 0 % (ref 0.0–0.2)

## 2024-03-05 LAB — TROPONIN I (HIGH SENSITIVITY)
Troponin I (High Sensitivity): 13 ng/L (ref <18)
Troponin I (High Sensitivity): 13 ng/L (ref <18)

## 2024-03-05 LAB — RESP PANEL BY RT-PCR (RSV, FLU A&B, COVID)  RVPGX2
Influenza A by PCR: NEGATIVE
Influenza B by PCR: NEGATIVE
Resp Syncytial Virus by PCR: NEGATIVE
SARS Coronavirus 2 by RT PCR: NEGATIVE

## 2024-03-05 LAB — I-STAT CG4 LACTIC ACID, ED
Lactic Acid, Venous: 0.6 mmol/L (ref 0.5–1.9)
Lactic Acid, Venous: 0.5 mmol/L (ref 0.5–1.9)

## 2024-03-05 LAB — BRAIN NATRIURETIC PEPTIDE: B Natriuretic Peptide: 20.3 pg/mL (ref 0.0–100.0)

## 2024-03-05 MED ORDER — IPRATROPIUM-ALBUTEROL 0.5-2.5 (3) MG/3ML IN SOLN
3.0000 mL | Freq: Once | RESPIRATORY_TRACT | Status: AC
  Administered 2024-03-05: 3 mL via RESPIRATORY_TRACT
  Filled 2024-03-05: qty 3

## 2024-03-05 MED ORDER — BUPROPION HCL ER (XL) 150 MG PO TB24
150.0000 mg | ORAL_TABLET | Freq: Every day | ORAL | Status: DC
  Administered 2024-03-06 – 2024-03-08 (×3): 150 mg via ORAL
  Filled 2024-03-05 (×3): qty 1

## 2024-03-05 MED ORDER — MONTELUKAST SODIUM 10 MG PO TABS
10.0000 mg | ORAL_TABLET | Freq: Every day | ORAL | Status: DC
  Administered 2024-03-06 – 2024-03-07 (×3): 10 mg via ORAL
  Filled 2024-03-05 (×3): qty 1

## 2024-03-05 MED ORDER — LORATADINE 10 MG PO TABS
10.0000 mg | ORAL_TABLET | Freq: Every day | ORAL | Status: DC
  Administered 2024-03-06 – 2024-03-08 (×3): 10 mg via ORAL
  Filled 2024-03-05 (×3): qty 1

## 2024-03-05 MED ORDER — ACETAMINOPHEN 325 MG PO TABS
650.0000 mg | ORAL_TABLET | Freq: Four times a day (QID) | ORAL | Status: DC | PRN
  Administered 2024-03-07 (×2): 650 mg via ORAL
  Filled 2024-03-05 (×3): qty 2

## 2024-03-05 MED ORDER — ONDANSETRON HCL 4 MG/2ML IJ SOLN: 4.0000 mg | Freq: Four times a day (QID) | INTRAMUSCULAR | Status: DC | PRN

## 2024-03-05 MED ORDER — FLUTICASONE PROPIONATE 50 MCG/ACT NA SUSP
1.0000 | Freq: Two times a day (BID) | NASAL | Status: DC
  Administered 2024-03-06 – 2024-03-07 (×2): 1 via NASAL
  Filled 2024-03-05 (×2): qty 16

## 2024-03-05 MED ORDER — REVEFENACIN 175 MCG/3ML IN SOLN
175.0000 ug | Freq: Every day | RESPIRATORY_TRACT | Status: DC
  Administered 2024-03-06 – 2024-03-08 (×3): 175 ug via RESPIRATORY_TRACT
  Filled 2024-03-05 (×3): qty 3

## 2024-03-05 MED ORDER — ROSUVASTATIN CALCIUM 20 MG PO TABS
40.0000 mg | ORAL_TABLET | Freq: Every day | ORAL | Status: DC
  Administered 2024-03-06 – 2024-03-08 (×3): 40 mg via ORAL
  Filled 2024-03-05 (×3): qty 2

## 2024-03-05 MED ORDER — IOHEXOL 350 MG/ML SOLN
75.0000 mL | Freq: Once | INTRAVENOUS | Status: AC | PRN
  Administered 2024-03-05: 75 mL via INTRAVENOUS

## 2024-03-05 MED ORDER — IPRATROPIUM-ALBUTEROL 0.5-2.5 (3) MG/3ML IN SOLN
3.0000 mL | Freq: Once | RESPIRATORY_TRACT | Status: AC
  Administered 2024-03-06: 3 mL via RESPIRATORY_TRACT
  Filled 2024-03-05: qty 3

## 2024-03-05 MED ORDER — ESCITALOPRAM OXALATE 20 MG PO TABS
20.0000 mg | ORAL_TABLET | Freq: Every day | ORAL | Status: DC
  Administered 2024-03-06 – 2024-03-08 (×3): 20 mg via ORAL
  Filled 2024-03-05 (×3): qty 1

## 2024-03-05 MED ORDER — LEVOTHYROXINE SODIUM 75 MCG PO TABS
225.0000 ug | ORAL_TABLET | Freq: Every day | ORAL | Status: DC
  Administered 2024-03-08: 225 ug via ORAL
  Filled 2024-03-05 (×2): qty 3

## 2024-03-05 MED ORDER — METRONIDAZOLE 500 MG/100ML IV SOLN
500.0000 mg | Freq: Two times a day (BID) | INTRAVENOUS | Status: DC
Start: 2024-03-06 — End: 2024-03-06
  Administered 2024-03-06: 500 mg via INTRAVENOUS
  Filled 2024-03-05 (×2): qty 100

## 2024-03-05 MED ORDER — SODIUM CHLORIDE 0.9% FLUSH
3.0000 mL | Freq: Two times a day (BID) | INTRAVENOUS | Status: DC
  Administered 2024-03-06 – 2024-03-08 (×5): 3 mL via INTRAVENOUS

## 2024-03-05 MED ORDER — SODIUM CHLORIDE 0.9 % IV SOLN
2.0000 g | Freq: Three times a day (TID) | INTRAVENOUS | Status: DC
  Administered 2024-03-06 – 2024-03-08 (×7): 2 g via INTRAVENOUS
  Filled 2024-03-05 (×7): qty 12.5

## 2024-03-05 MED ORDER — ACETAMINOPHEN 650 MG RE SUPP: 650.0000 mg | Freq: Four times a day (QID) | RECTAL | Status: DC | PRN

## 2024-03-05 MED ORDER — VANCOMYCIN HCL 2000 MG/400ML IV SOLN
2000.0000 mg | Freq: Once | INTRAVENOUS | Status: AC
  Administered 2024-03-06: 2000 mg via INTRAVENOUS
  Filled 2024-03-05: qty 400

## 2024-03-05 MED ORDER — LOSARTAN POTASSIUM 50 MG PO TABS
50.0000 mg | ORAL_TABLET | Freq: Every day | ORAL | Status: DC
  Administered 2024-03-06 – 2024-03-08 (×3): 50 mg via ORAL
  Filled 2024-03-05 (×3): qty 1

## 2024-03-05 MED ORDER — ARFORMOTEROL TARTRATE 15 MCG/2ML IN NEBU
15.0000 ug | INHALATION_SOLUTION | Freq: Two times a day (BID) | RESPIRATORY_TRACT | Status: DC
Start: 2024-03-06 — End: 2024-03-08
  Administered 2024-03-06 – 2024-03-08 (×5): 15 ug via RESPIRATORY_TRACT
  Filled 2024-03-05 (×5): qty 2

## 2024-03-05 MED ORDER — SODIUM CHLORIDE 0.9 % IV SOLN
2.0000 g | Freq: Once | INTRAVENOUS | Status: AC
  Administered 2024-03-06: 2 g via INTRAVENOUS
  Filled 2024-03-05: qty 12.5

## 2024-03-05 MED ORDER — ENOXAPARIN SODIUM 40 MG/0.4ML IJ SOSY
40.0000 mg | PREFILLED_SYRINGE | Freq: Every day | INTRAMUSCULAR | Status: DC
Start: 2024-03-06 — End: 2024-03-06
  Administered 2024-03-06: 40 mg via SUBCUTANEOUS
  Filled 2024-03-05: qty 0.4

## 2024-03-05 MED ORDER — BUDESONIDE 0.5 MG/2ML IN SUSP
0.5000 mg | Freq: Every day | RESPIRATORY_TRACT | Status: DC
  Administered 2024-03-06 – 2024-03-08 (×3): 0.5 mg via RESPIRATORY_TRACT
  Filled 2024-03-05 (×4): qty 2

## 2024-03-05 MED ORDER — ONDANSETRON HCL 4 MG PO TABS: 4.0000 mg | ORAL_TABLET | Freq: Four times a day (QID) | ORAL | Status: DC | PRN

## 2024-03-05 MED ORDER — CARVEDILOL 12.5 MG PO TABS
12.5000 mg | ORAL_TABLET | Freq: Two times a day (BID) | ORAL | Status: DC
  Administered 2024-03-06 – 2024-03-08 (×6): 12.5 mg via ORAL
  Filled 2024-03-05 (×6): qty 1

## 2024-03-05 MED ORDER — METHYLPREDNISOLONE SODIUM SUCC 125 MG IJ SOLR
125.0000 mg | Freq: Once | INTRAMUSCULAR | Status: AC
  Administered 2024-03-05: 125 mg via INTRAVENOUS
  Filled 2024-03-05: qty 2

## 2024-03-05 MED ORDER — IPRATROPIUM-ALBUTEROL 0.5-2.5 (3) MG/3ML IN SOLN: 3.0000 mL | RESPIRATORY_TRACT | Status: DC | PRN

## 2024-03-05 NOTE — H&P (Incomplete)
 Hospital Admission History and Physical Service Pager: [phone]  Patient name: Deborah [name] Medical record number: [id number] Date of Birth: 1956-02-25 Age: 68 y.o. Gender: female  Primary Care Provider: Cleotilde Lukes, DO Consultants: None Code Status:  Full code confirmed by patient Preferred Emergency Contact:  Contact Information     Name Relation Home Work [place] Daughter [phone]     [name],William Spouse   [phone]      Other Contacts   None on File     Chief Complaint: SOB, low oxygen  saturation  Differential and Medical Decision Making:  Deborah [name] is a 68 y.o. female presenting with shortness of breath, cough, hemoptysis, and low oxygen  saturation at home.  In the ED, her workup including chest x-ray was overall unremarkable however she did have significant oxygen  saturation to 30s at home and 60s and 80s.  She is requiring blow-by oxygen  via her trach at 10 L and currently maintaining saturation in the low 90s which is appropriate given her COPD. On exam, she has diffuse rhonchi and some faint bilateral expiratory wheezing. Given her hemoptysis and desaturation, there was certainly concern for pulmonary embolism.  Also concern for malignancy given her history of breast cancer and laryngeal cancer.  With her tracheostomy, differential also includes tracheitis, aspiration, pneumonia versus pneumonitis.  Reassuringly she is afebrile with normal lactic acid.  COPD exacerbation is also on the differential. She was recently admitted with COVID-pneumonia however was able to discharge without any oxygen  requirement after receiving steroids and baricitinib .  Currently her COVID/flu testing is negative.    Assessment & Plan Acute hypoxemic respiratory failure (HCC) Aspiration pneumonitis (HCC) Tracheitis *** -Admit to FMTS, attending Dr. McDiarmid, progressive given significant hypoxemia and tracheostomy, required brief  ICU stay during recent admission -Continue supplemental oxygen , wean as tolerated -F/u CTA PE ordered in ED -Repeat duoneb*** -*** Heart murmur Pericardial effusion  Chronic health problem Asthma - *** COPD - *** T2DM - *** HTN - *** HLD - *** Anxiety - *** Hypothyroidism - ***   FEN/GI: ***Reg diet VTE Prophylaxis: ***Lovenox   Disposition: ***Progressive  History of Present Illness:  Deborah [name] is a 68 y.o. female presenting with ***  After recent discharge was feeling better Yesterday started feeling a little short of breath and congested, but her oxygen  saturations stayed in the 90s Today, when she woke up she was in the 80s but soon corrected into the 90s. However, later in the day she rechecked and she was in the 30s. Was feeling short of breath and coughing.   Pt reports cough productive of blood and phlegm. Hemoptysis ongoing since yesterday. No hx PE or blood clots. Endorses mild intermittent nausea.  Currently reports feeling a bit better after the duoneb and steroid. Doing well on the blow by oxygen .   Denies CP, abd pain, vomiting, leg swelling, headache or vision changes. Denies current nausea.  In the ED, afebrile, was hypoxic to 65% on room air then placed on blow-by oxygen  via her tracheostomy to 10 L with improvement to mid 90s.  Received DuoNeb and Solu-Medrol .  Chest x-ray with no acute process.  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   Pertinent Past  Surgical History: Breast lumpectomy 2020 EGD with dilation  Laryngectomy Stoma plasty 2018 Total knee arthroplasty - right 2021, left 2022 Tubal ligation  1982  Remainder reviewed in history tab.  Pertinent Social History: Tobacco use: Former, quit in 2010, smoked 2 packs/day x 20 years Alcohol use: 1-2 beers *** Other Substance use: none Lives with husband, sister  Pertinent Family History: Heart disease in mother, father, sister   Important Outpatient Medications: *** Adderall 20 mg twice daily*** Pulmicort  neb daily, formoterol  neb twice daily, Yupelri  neb daily*** Bupropion  150 mg daily Carvedilol  12.5 mg twice daily Cetrizine 10 mg daily Escitalopram  20 mg daily Flonase  50 mg twice daily Insulin  lispro 10 units 3 times daily Lantus  35 units daily Levothyroxine  225 mcg daily Losartan  50 mg Metformin  500 mg 24-hour tablet Montelukast  10 mg daily Rosuvastatin  40 mg Semaglutide  0.5 mg weekly   Objective: BP (!) 144/59   Pulse 93   Temp 99.2 F (37.3 C) (Oral)   Resp 19   Ht 5' 7 (1.702 m)   Wt 110.2 kg   SpO2 92%   BMI 38.05 kg/m  Exam: General: *** Eyes: *** ENTM: *** Neck: *** Cardiovascular: *** Respiratory: *** Gastrointestinal: *** MSK: *** Derm: *** Neuro: *** Psych: ***  Labs:  CBC BMET  Recent Labs  Lab 03/05/24 1800  WBC 8.0  HGB 12.0  HCT 38.5  PLT 398   Recent Labs  Lab 03/05/24 1800  NA 138  K 4.6  CL 102  CO2 28  BUN 16  CREATININE 0.92  GLUCOSE 133*  CALCIUM  8.5*    Pertinent additional labs ***.    EKG: Sinus tachycardia without evidence of acute ischemia   Imaging Studies Performed:  CXR: IMPRESSION: 1. No acute cardiopulmonary disease. 2. Chronic left lower lobe collapse. Electronically Signed   By: Toribio Agreste M.D.   On: 03/05/2024 17:03     Romelle Booty, MD 03/05/2024, 10:31 PM PGY-***, [facility]  FPTS Intern pager: [phone], text pages welcome Secure chat group [facility] Teaching Service

## 2024-03-05 NOTE — Progress Notes (Incomplete)
 Pharmacy Antibiotic Note  Deborah [name] is a 68 y.o. female admitted on 03/05/2024 with tracheitis/aspiration pneumonia.  Pharmacy has been consulted for cefepime /vancomycin  dosing.  Plan: Cefepime  2 grams IV every 8 hours Vancomycin  2000 mg IV x 1 dose, followed by vancomycin    Height: 5' 7 (170.2 cm) Weight: 110.2 kg (242 lb 15.2 oz) IBW/kg (Calculated) : 61.6  Temp (24hrs), Avg:99.1 F (37.3 C), Min:99 F (37.2 C), Max:99.2 F (37.3 C)  Recent Labs  Lab 02/28/24 0236 03/05/24 1800 03/05/24 1805 03/05/24 2019  WBC  --  8.0  --   --   CREATININE 0.99 0.92  --   --   LATICACIDVEN  --   --  0.6 0.5    Estimated Creatinine Clearance: 76.9 mL/min (by C-G formula based on SCr of 0.92 mg/dL).    Allergies  Allergen Reactions  . Lisinopril  Cough    Antimicrobials this admission: *** *** >> *** *** *** >> ***  Dose adjustments this admission: ***  Microbiology results: *** BCx: *** *** UCx: ***  *** Sputum: ***  *** MRSA PCR: ***  Thank you for allowing pharmacy to be a part of this patient's care.  Dax Murguia C Kashia Brossard 03/05/2024 11:59 PM

## 2024-03-05 NOTE — Assessment & Plan Note (Signed)
 Asthma/COPD -Home budesonide , formoterol , Yupelri , montelukast  T2DM -resistant SSI, CBG checks ACHS, home Lantus  35 units held on admission, consider restarting this.  Most recent A1c 6.5 HTN -home losartan  50 mg daily, Coreg  12.5 mg twice daily HLD -Home Crestor  40 mg daily Anxiety -Home Lexapro  20 mg, bupropion  150 mg daily Hypothyroidism -Home levothyroxine  225 mcg daily.  Most recent TSH 9.4 on 9/13, her dose was adjusted during her recent hospital stay and will be due for recheck in about 1 month

## 2024-03-05 NOTE — ED Triage Notes (Signed)
 Pt came in with trach and low sats the family member reports a 02 sat at home of 32  the 02 sat in triage was 38 % coughing  resp difficulty

## 2024-03-05 NOTE — Assessment & Plan Note (Addendum)
 Currently requiring 10 L supplemental O2 via tracheostomy. No home O2 requirement. Otherwise afebrile and hemodynamically stable. -Admit to FMTS, attending Dr. McDiarmid, progressive given significant hypoxemia and tracheostomy, required brief ICU stay during recent admission -Start broad spectrum antibiotics:  -IV Vancomycin  per pharmacy (9/21-)  -IV Cefepime  per pharmacy (9/21-)  -IV Flagyl  500mg  q8h for anaerobic coverage in setting of ?aspiration and tracheostomy (9/21-) -Continue supplemental oxygen  via trach, wean as tolerated. Goal 88-92% given hx COPD -Ordered repeat duoneb given initial improvement with this and persistent wheezing on exam -Duoneb PRN -Consider redosing steroids in AM -Continue home nebulizers starting in AM -Consider pulmonology consult if worsening

## 2024-03-05 NOTE — H&P (Incomplete)
 Hospital Admission History and Physical Service Pager: [phone]  Patient name: Deborah [name] Medical record number: [id number] Date of Birth: 16-Feb-1956 Age: 68 y.o. Gender: female  Primary Care Provider: Cleotilde Lukes, DO Consultants: None Code Status:  Full code confirmed by patient Preferred Emergency Contact:  Contact Information     Name Relation Home Work [employer] Daughter [phone]     [name],William Spouse   [phone]      Other Contacts   None on File     Chief Complaint: SOB, low oxygen  saturation  Differential and Medical Decision Making:  Chalise Pe is a 68 y.o. female presenting with shortness of breath, cough, hemoptysis, and low oxygen  saturation at home.   In the ED, her workup including chest x-ray was overall unremarkable however she did have significant oxygen  saturation to 30s at home and 60s and 80s.  She is requiring blow-by oxygen  via her trach at 10 L and currently maintaining saturation in the low 90s which is appropriate given her COPD. Received duoneb and solumedrol with some improvement.  On exam, she has diffuse rhonchi and some faint bilateral expiratory wheezing. Also has a murmur, unclear whether this is chronic based on chart review.  Given her hemoptysis and desaturation, there was certainly concern for pulmonary embolism.  Also concern for malignancy given her history of breast cancer and laryngeal cancer.  With her tracheostomy, differential also includes tracheitis, aspiration, pneumonia versus pneumonitis.  Reassuringly she is afebrile with normal lactic acid.  COPD exacerbation is also on the differential. She was recently admitted with COVID-pneumonia however was able to discharge without any oxygen  requirement after receiving steroids and baricitinib .  Currently her COVID/flu testing is negative. Her BNP and troponin x 2 were normal, reassuring against acute cardiac ischemia or fluid  overload/CHF exacerbation.  She is also euvolemic on exam.  CTA PE ordered in the ED shows no evidence of embolus.  However, there is evidence of tracheitis and possible left upper lobe pneumonia/aspiration/pneumonitis.  There is also a new right lower lobe opacity and increased mediastinal/hilar adenopathy, these findings may be related to acute inflammation but neoplasm is not excluded. Assessment & Plan Acute hypoxemic respiratory failure (HCC) Aspiration pneumonitis (HCC) Tracheitis Currently requiring 10 L supplemental O2 via tracheostomy. No home O2 requirement. Otherwise afebrile and hemodynamically stable. -Admit to FMTS, attending Dr. McDiarmid, progressive given significant hypoxemia and tracheostomy, required brief ICU stay during recent admission -Start broad spectrum antibiotics:  -IV Vancomycin  per pharmacy (9/21-)  -IV Cefepime  per pharmacy (9/21-)  -IV Flagyl  500mg  q8h for anaerobic coverage in setting of ?aspiration and tracheostomy (9/21-) -Continue supplemental oxygen  via trach, wean as tolerated. Goal 88-92% given hx COPD -Ordered repeat duoneb given initial improvement with this and persistent wheezing on exam -Duoneb PRN -Consider redosing steroids in AM -Continue home nebulizers starting in AM -Consider pulmonology consult if worsening Hemoptysis Opacity of lung on imaging study No evidence of PE. ?inflammatory vs neoplastic. Hx breast cancer and laryngeal cancer. Former smoker, 40 pack years. -Follow up CT recommended in 3 months -Consider pulm consult Heart murmur Pericardial effusion Systolic murmur appreciated on exam, most recent echo 2024 showed TR and AR as well as small pericardial effusion (also noted on CTA on this admission). No current evidence of pericarditis or tamponade. -Echocardiogram ordered Chronic health problem Asthma/COPD -Home budesonide , formoterol , Yupelri , montelukast  T2DM -resistant SSI, CBG checks ACHS, home Lantus  35 units held on  admission, consider restarting this.  Most recent A1c  6.5 HTN -home losartan  50 mg daily, Coreg  12.5 mg twice daily HLD -Home Crestor  40 mg daily Anxiety -Home Lexapro  20 mg, bupropion  150 mg daily Hypothyroidism -Home levothyroxine  225 mcg daily.  Most recent TSH 9.4 on 9/13, her dose was adjusted during her recent hospital stay and will be due for recheck in about 1 month   FEN/GI: Reg diet VTE Prophylaxis: Lovenox   Disposition: Progressive  History of Present Illness:  Deborah [name] is a 68 y.o. female presenting with shortness of breath, hemoptysis, low oxygen  saturation  After recent discharge was feeling better Yesterday started feeling a little [name] of breath and congested, but her oxygen  saturations stayed in the 90s Today, when she woke up she was in the 80s but soon corrected into the 90s. However, later in the day she rechecked and she was in the 30s. Was feeling [name] of breath and coughing.   Pt reports cough productive of blood and phlegm. Hemoptysis ongoing since yesterday. No hx PE or blood clots. Endorses mild intermittent nausea.  Currently reports feeling a bit better after the duoneb and steroid. Doing well on the blow by oxygen .   Denies CP, abd pain, vomiting, leg swelling, headache or vision changes. Denies current nausea.  In the ED, afebrile, was hypoxic to 65% on room air then placed on blow-by oxygen  via her tracheostomy to 10 L with improvement to mid 90s.  Received DuoNeb and Solu-Medrol .  Chest x-ray with no acute process.  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   Pertinent Past Surgical History: Breast lumpectomy  2020 EGD with dilation  Laryngectomy Stoma plasty 2018 Total knee arthroplasty - right 2021, left 2022 Tubal ligation 1982  Remainder reviewed in history tab.  Pertinent Social History: Tobacco use: Former, quit in 2010, smoked 2 packs/day x 20 years Alcohol use: 1-2 beers every now and then Other Substance use: none Lives with husband, sister  Pertinent Family History: Heart disease in mother, father, sister   Important Outpatient Medications: *did not take evening meds on day of admission Adderall 20 mg twice daily Pulmicort  neb daily, formoterol  neb twice daily, Yupelri  neb daily Bupropion  150 mg daily Carvedilol  12.5 mg twice daily Cetrizine 10 mg daily Escitalopram  20 mg daily Flonase  50 mg twice daily Insulin  lispro 10 units 3 times daily Lantus  35 units daily Levothyroxine  225 mcg daily Losartan  50 mg Metformin  500 mg 24-hour tablet Montelukast  10 mg daily Rosuvastatin  40 mg Semaglutide  0.5 mg weekly   Objective: BP (!) 144/59   Pulse 93   Temp 99.2 F (37.3 C) (Oral)   Resp 19   Ht 5' 7 (1.702 m)   Wt 110.2 kg   SpO2 92%   BMI 38.05 kg/m  Exam: General: NAD, occasional coughing.  Pleasant and conversational.  Able to articulate clearly with occluded trach. Eyes: PERRLA EOMI Neck: Tracheal stoma present, no significant surrounding erythema or drainage. Cardiovascular: RRR, 2-3/6 systolic murmur appreciated Respiratory: Diffuse rhonchi bilaterally.  Faint bilateral expiratory wheezing.  Normal WOB on 10L blow-by oxygen  via nonrebreather over tracheostomy  Gastrointestinal: Soft NTND MSK: Moves all extremities.  No significant edema in bilateral lower extremities Derm: No obvious rash Neuro: No gross focal deficits Psych: Normal affect and mood  Labs:  CBC BMET  Recent Labs  Lab 03/05/24 1800  WBC 8.0  HGB 12.0  HCT 38.5  PLT 398  Differential WNL Recent Labs  Lab 03/05/24 1800  NA 138  K 4.6  CL 102  CO2 28  BUN 16  CREATININE 0.92   GLUCOSE 133*  CALCIUM  8.5*     BNP 20.3 troponin 13, 13 Flu/COVID-negative Lactate 0.6, 0.5 AST/ALT/alk phos/T. bili: 34/32/97/0.5  EKG: Sinus tachycardia without evidence of acute ischemia   Imaging Studies Performed:  CXR: IMPRESSION: 1. No acute cardiopulmonary disease. 2. Chronic left lower lobe collapse. Electronically Signed   By: Toribio Agreste M.D.   On: 03/05/2024 17:03     Romelle Booty, MD 03/05/2024, 10:31 PM PGY-3, [facility]  FPTS Intern pager: [phone], text pages welcome Secure chat group [facility] Teaching Service

## 2024-03-05 NOTE — Progress Notes (Signed)
 Pharmacy Antibiotic Note  Deborah [name] is a 68 y.o. female admitted on 03/05/2024 with tracheitis/aspiration pneumonia.  Pharmacy has been consulted for cefepime /vancomycin  dosing.  Plan: Cefepime  2 grams IV every 8 hours Vancomycin  2000 mg IV x 1 dose, followed by  vancomycin  1500 every 24 hours (estAUC 514.1, Vd 0.5, wt 110.2, SCr 0.92)  Height: 5' 7 (170.2 cm) Weight: 110.2 kg (242 lb 15.2 oz) IBW/kg (Calculated) : 61.6  Temp (24hrs), Avg:99.1 F (37.3 C), Min:99 F (37.2 C), Max:99.2 F (37.3 C)  Recent Labs  Lab 02/28/24 0236 03/05/24 1800 03/05/24 1805 03/05/24 2019  WBC  --  8.0  --   --   CREATININE 0.99 0.92  --   --   LATICACIDVEN  --   --  0.6 0.5    Estimated Creatinine Clearance: 76.9 mL/min (by C-G formula based on SCr of 0.92 mg/dL).    Allergies  Allergen Reactions   Lisinopril  Cough    Antimicrobials this admission: 9/21 cefepime  >>  9/21 vancomycin  >>   Dose adjustments this admission:   Microbiology results:   Thank you for allowing pharmacy to be a part of this patient's care.   Bascom JAYSON Louder, PharmD 03/06/2024 12:04 AM  **Pharmacist phone directory can be found on [email or web address] listed under [facility]**

## 2024-03-05 NOTE — ED Provider Notes (Signed)
 [place] EMERGENCY DEPARTMENT AT [facility] Provider Note   CSN: [id number] Arrival date & time: 03/05/24  1554     Patient presents with: Shortness of Breath   Deborah [name] is a 68 y.o. female.   The history is provided by the patient and medical records. No language interpreter was used.  Shortness of Breath Severity:  Severe Onset quality:  Gradual Duration:  1 day Progression:  Waxing and waning Chronicity:  Recurrent Context: URI   Relieved by:  Oxygen  Worsened by:  Coughing Associated symptoms: chest pain (pleuritic), cough and wheezing   Associated symptoms: no abdominal pain, no diaphoresis, no fever, no hemoptysis, no neck pain, no rash, no sputum production and no vomiting   Risk factors: hx of cancer   Risk factors: no hx of PE/DVT        Prior to Admission medications   Medication Sig Start Date End Date Taking? Authorizing Provider  amphetamine-dextroamphetamine (ADDERALL) 20 MG tablet Take 20 mg by mouth 2 (two) times daily. 02/05/24   [provider]  budesonide  (PULMICORT ) 0.5 MG/2ML nebulizer solution Take 2 mLs (0.5 mg total) by nebulization daily. 09/17/23   Cleotilde Lukes, DO  buPROPion  (WELLBUTRIN  XL) 150 MG 24 hr tablet TAKE 1 TABLET BY MOUTH DAILY 10/28/23   Cleotilde Lukes, DO  carvedilol  (COREG ) 12.5 MG tablet Take 1 tablet (12.5 mg total) by mouth 2 (two) times daily with a meal. 08/13/23   Cleotilde Lukes, DO  Continuous Glucose Receiver (DEXCOM G7 RECEIVER) DEVI Apply 1 each topically See admin instructions. 12/02/23   Cleotilde Lukes, DO  Continuous Glucose Sensor (DEXCOM G7 SENSOR) MISC APPLY NEW SENSOR EVERY 10 DAYS 11/12/23   Cleotilde Lukes, DO  EQ ALLERGY RELIEF, CETIRIZINE , 10 MG tablet Take 1 tablet by mouth once daily 09/11/23   Cleotilde Lukes, DO  escitalopram  (LEXAPRO ) 20 MG tablet Take 1 tablet (20 mg total) by mouth daily. 02/28/24   Tharon Lung, MD  fluticasone  (FLONASE ) 50 MCG/ACT nasal spray Place 1  spray into both nostrils 2 (two) times daily. 02/01/24   Cleotilde Lukes, DO  formoterol  (PERFOROMIST ) 20 MCG/2ML nebulizer solution Take 2 mLs (20 mcg total) by nebulization 2 (two) times daily. 09/17/23   Cleotilde Lukes, DO  Humidifiers MISC 1 application by Tracheal Tube route as needed. 02/05/21   Austin Ade, MD  insulin  lispro (HUMALOG  KWIKPEN) 100 UNIT/ML KwikPen Inject 10 Units into the skin 3 (three) times daily. 10/23/23   McDiarmid, Krystal BIRCH, MD  Insulin  Pen Needle (BD PEN NEEDLE NANO 2ND GEN) 32G X 4 MM MISC Inject 1 each into the skin 4 (four) times daily. 10/26/23   Cleotilde Lukes, DO  LANTUS  SOLOSTAR 100 UNIT/ML Solostar Pen INJECT SUBCUTANEOUSLY 35 UNITS  DAILY 02/08/24   Cleotilde Lukes, DO  levothyroxine  (SYNTHROID ) 75 MCG tablet Take 3 tablets (225 mcg total) by mouth daily at 6 (six) AM. 02/28/24   Tharon Lung, MD  losartan  (COZAAR ) 50 MG tablet Take 1 tablet (50 mg total) by mouth daily. 11/17/23   McDiarmid, Krystal BIRCH, MD  metFORMIN  (GLUCOPHAGE -XR) 500 MG 24 hr tablet TAKE 1 TABLET BY MOUTH DAILY  WITH BREAKFAST 09/28/23   Cleotilde Lukes, DO  montelukast  (SINGULAIR ) 10 MG tablet TAKE 1 TABLET BY MOUTH AT BEDTIME 09/11/23   Miller, Samantha, DO  nystatin  cream (MYCOSTATIN ) Apply 1 Application topically daily as needed (rash under breast stomach).    [provider]  ondansetron  (ZOFRAN ) 4 MG tablet Take 1 tablet (4 mg total) by  mouth every 8 (eight) hours as needed for nausea or vomiting. 02/01/24   Cleotilde Lukes, DO  revefenacin  (YUPELRI ) 175 MCG/3ML nebulizer solution Take 3 mLs (175 mcg total) by nebulization daily. 12/30/23   Kara Dorn NOVAK, MD  rosuvastatin  (CRESTOR ) 40 MG tablet TAKE 1 TABLET BY MOUTH DAILY 01/05/24   Cleotilde Lukes, DO  Semaglutide ,0.25 or 0.5MG /DOS, 2 MG/3ML SOPN Inject 0.5 mg into the skin once a week. 01/25/24   McDiarmid, Krystal BIRCH, MD  sodium chloride  HYPERTONIC 3 % nebulizer solution Take by nebulization 3 (three) times daily. Patient not taking:  Reported on 02/23/2024 05/22/23   Kara Dorn NOVAK, MD    Allergies: Lisinopril     Review of Systems  Constitutional:  Positive for fatigue. Negative for chills, diaphoresis and fever.  HENT:  Negative for congestion.   Respiratory:  Positive for cough, chest tightness, shortness of breath and wheezing. Negative for hemoptysis, sputum production and stridor.   Cardiovascular:  Positive for chest pain (pleuritic). Negative for palpitations and leg swelling.  Gastrointestinal:  Negative for abdominal pain, constipation, diarrhea, nausea and vomiting.  Genitourinary:  Negative for dysuria.  Musculoskeletal:  Negative for back pain, neck pain and neck stiffness.  Skin:  Negative for rash and wound.  Psychiatric/Behavioral:  Negative for agitation.   All other systems reviewed and are negative.   Updated Vital Signs BP (!) 169/85   Pulse (!) 113   Temp 99 F (37.2 C)   Resp 20   Ht 5' 7 (1.702 m)   Wt 110.2 kg   SpO2 (!) 65%   BMI 38.05 kg/m   Physical Exam Vitals and nursing note reviewed.  Constitutional:      General: She is not in acute distress.    Appearance: She is well-developed. She is not ill-appearing, toxic-appearing or diaphoretic.  HENT:     Head: Normocephalic and atraumatic.  Eyes:     Conjunctiva/sclera: Conjunctivae normal.     Pupils: Pupils are equal, round, and reactive to light.  Cardiovascular:     Rate and Rhythm: Regular rhythm. Tachycardia present.     Heart sounds: No murmur heard. Pulmonary:     Effort: Tachypnea present.     Breath sounds: No stridor. Wheezing, rhonchi and rales present.  Chest:     Chest wall: No tenderness.  Abdominal:     Palpations: Abdomen is soft.     Tenderness: There is no abdominal tenderness.  Musculoskeletal:        General: No swelling.     Cervical back: Neck supple.     Right lower leg: No edema.     Left lower leg: No edema.  Skin:    General: Skin is warm and dry.     Capillary Refill: Capillary refill  takes less than 2 seconds.     Findings: No erythema.  Neurological:     General: No focal deficit present.     Mental Status: She is alert.  Psychiatric:        Mood and Affect: Mood normal.     (all labs ordered are listed, but only abnormal results are displayed) Labs Reviewed  COMPREHENSIVE METABOLIC PANEL WITH GFR - Abnormal; Notable for the following components:      Result Value   Glucose, Bld 133 (*)    Calcium  8.5 (*)    All other components within normal limits  RESP PANEL BY RT-PCR (RSV, FLU A&B, COVID)  [id number]  CBC WITH DIFFERENTIAL/PLATELET  BRAIN NATRIURETIC PEPTIDE  BASIC METABOLIC  PANEL WITH GFR  CBC  I-STAT CG4 LACTIC ACID, ED  I-STAT CG4 LACTIC ACID, ED  TROPONIN I (HIGH SENSITIVITY)  TROPONIN I (HIGH SENSITIVITY)    EKG: EKG Interpretation Date/Time:  Saturday March 05 2024 16:53:57 EDT Ventricular Rate:  104 PR Interval:  178 QRS Duration:  83 QT Interval:  338 QTC Calculation: 445 R Axis:   53  Text Interpretation: Sinus tachycardia Anterior infarct, old Nonspecific T abnormalities, lateral leads when compared to prior, similar appearnac no STEMI Confirmed by Ginger Barefoot ([id number]) on 03/05/2024 4:55:15 PM  Radiology: CT Angio Chest PE W and/or Wo Contrast Result Date: 03/05/2024 CLINICAL DATA:  Hypoxia and respiratory distress. Patient with a chronic tracheostomy, prior CTs demonstrating occluded left lower lobe bronchus and complete left lower lobe collapse. EXAM: CT ANGIOGRAPHY CHEST WITH CONTRAST TECHNIQUE: Multidetector CT imaging of the chest was performed using the standard protocol during bolus administration of intravenous contrast. Multiplanar CT image reconstructions and MIPs were obtained to evaluate the vascular anatomy. RADIATION DOSE REDUCTION: This exam was performed according to the departmental dose-optimization program which includes automated exposure control, adjustment of the mA and/or kV according to patient size and/or use of  iterative reconstruction technique. CONTRAST:  75mL OMNIPAQUE  IOHEXOL  350 MG/ML SOLN COMPARISON:  Portable chest today, portable chest 02/28/2024 and 02/25/2024, lung cancer screening chest CT 08/21/2023 without contrast, CTA chest 08/29/2021. FINDINGS: Cardiovascular: Mild cardiomegaly. There is a chronic small pericardial effusion. Negative for venous dilatation. Arterial opacification is diagnostic. No arterial dilatation or embolism is seen. Trace single vessel calcification again noted LAD coronary artery. Mild aortic atherosclerosis without aneurysm or dissection. The great vessels are unremarkable apart from a retroesophageal aberrant right subclavian artery. Mediastinum/Nodes: There is increased subcarinal adenopathy without nodal conglomerate measuring 3.7 x 2.0 cm on 5:61, was previously 2.3 x 1.3 cm. Increased prominence of right paratracheal nodes up to 10 mm in short axis is also noted, with several prominent bihilar lymph nodes, on the left largest is 1.3 cm on 5:62, on the right the largest of these is 1.3 cm on 5:72. I do not see a tracheostomy cannula. There is a short metal lined small caliber tube connecting between the lower cervical trachea and the esophagus at C6-7 but this was seen previously. There is increased fluid and debris in the thoracic trachea and increased tracheal wall thickening likely due to tracheitis, with increased wall thickening in the main bronchi as well. There also is mild generalized increased thickening of the thoracic esophagus. There are no enlarged axillary lymph nodes. Thyroid  gland is not seen and probably removed or ablated. Lungs/Pleura: As above there is increased thickening in the main bronchi and central airways and occlusion with fluid and debris of the distal left main bronchus and left lower lobe bronchus. Left lower lobe with chronically collapsed with fluid in the left lower lobe bronchi. There is fluid obstruction today of the left upper lobe bronchus and  patchy consolidation in the lingular segments consistent with pneumonia or aspiration. In the lateral right lower lobe base there is a new pleural-based opacity measuring 1.4 x 0.9 cm on 6:77, most likely an inflammatory nodule but needs to be followed up on. Elsewhere, diffuse bronchial thickening is chronically noted with mosaicism in the lungs consistent with air trapping and small airway disease. There are mild paraseptal emphysematous changes in the lung apices, chronic right apical scarring. There are trace pleural effusions, new. No pneumothorax. Upper Abdomen: Hepatic steatosis. No acute upper abdominal findings. Musculoskeletal: Mild kyphosis  and degenerative change thoracic spine, slight dextroscoliosis. No suspicious regional bone lesion. Postsurgical change right breast with scarring. No chest wall mass. Review of the MIP images confirms the above findings. IMPRESSION: 1. No evidence of arterial dilatation or embolus. 2. Aortic and trace single vessel coronary artery atherosclerosis. 3. Cardiomegaly with chronic small pericardial effusion. 4. Increased fluid and debris in the thoracic trachea and increased tracheal wall thickening likely due to tracheitis, with increased wall thickening in the main bronchi and central airways as well. 5. Increased wall thickening in the esophagus. No masslike thickening. 6. Fluid obstruction of the distal left main bronchus and left lower lobe bronchus with chronically collapsed left lower lobe with fluid in the left lower lobe bronchi. 7. New fluid obstruction of the left upper lobe bronchus with patchy consolidation in the lingular segments consistent with pneumonia or aspiration. 8. New 14 x 9 mm pleural-based right lower lobe opacity. Possible inflammatory nodule but 12-month follow-up CT recommended. 9. Trace pleural effusions. 10. Increased mediastinal and hilar adenopathy, most likely reactive, alternatively neoplastic. 11. Hepatic steatosis. 12. Emphysema. Aortic  Atherosclerosis (ICD10-I70.0) and Emphysema (ICD10-J43.9). Electronically Signed   By: Francis Quam M.D.   On: 03/05/2024 23:16   DG Chest Portable 1 View Result Date: 03/05/2024 CLINICAL DATA:  Shortness of breath with hypoxia, cough and pleuritic chest pain. EXAM: PORTABLE CHEST 1 VIEW COMPARISON:  02/28/2024, on 02/21/2024 and CT 10/21/2023 FINDINGS: Lungs are adequately inflated with chronic stable left lower lobe collapse. Remainder lungs are unchanged cardiomediastinal silhouette and remainder of the exam is unchanged. IMPRESSION: 1. No acute cardiopulmonary disease. 2. Chronic left lower lobe collapse. Electronically Signed   By: Toribio Agreste M.D.   On: 03/05/2024 17:03     Procedures   Medications Ordered in the ED  ipratropium-albuterol  (DUONEB) 0.5-2.5 (3) MG/3ML nebulizer solution 3 mL (has no administration in time range)  carvedilol  (COREG ) tablet 12.5 mg (has no administration in time range)  losartan  (COZAAR ) tablet 50 mg (has no administration in time range)  rosuvastatin  (CRESTOR ) tablet 40 mg (has no administration in time range)  buPROPion  (WELLBUTRIN  XL) 24 hr tablet 150 mg (has no administration in time range)  escitalopram  (LEXAPRO ) tablet 20 mg (has no administration in time range)  levothyroxine  (SYNTHROID ) tablet 225 mcg (has no administration in time range)  budesonide  (PULMICORT ) nebulizer solution 0.5 mg (has no administration in time range)  loratadine  (CLARITIN ) tablet 10 mg (has no administration in time range)  fluticasone  (FLONASE ) 50 MCG/ACT nasal spray 1 spray (has no administration in time range)  arformoterol  (BROVANA ) nebulizer solution 15 mcg (has no administration in time range)  montelukast  (SINGULAIR ) tablet 10 mg (has no administration in time range)  revefenacin  (YUPELRI ) nebulizer solution 175 mcg (has no administration in time range)  enoxaparin  (LOVENOX ) injection 40 mg (has no administration in time range)  sodium chloride  flush (NS) 0.9 %  injection 3 mL (has no administration in time range)  acetaminophen  (TYLENOL ) tablet 650 mg (has no administration in time range)    Or  acetaminophen  (TYLENOL ) suppository 650 mg (has no administration in time range)  ondansetron  (ZOFRAN ) tablet 4 mg (has no administration in time range)    Or  ondansetron  (ZOFRAN ) injection 4 mg (has no administration in time range)  ipratropium-albuterol  (DUONEB) 0.5-2.5 (3) MG/3ML nebulizer solution 3 mL (has no administration in time range)  metroNIDAZOLE  (FLAGYL ) IVPB 500 mg (has no administration in time range)  ceFEPIme  (MAXIPIME ) 2 g in sodium chloride  0.9 % 100 mL  IVPB (has no administration in time range)  ipratropium-albuterol  (DUONEB) 0.5-2.5 (3) MG/3ML nebulizer solution 3 mL (3 mLs Nebulization Given 03/05/24 1641)  methylPREDNISolone  sodium succinate (SOLU-MEDROL ) 125 mg/2 mL injection 125 mg (125 mg Intravenous Given 03/05/24 1834)  iohexol  (OMNIPAQUE ) 350 MG/ML injection 75 mL (75 mLs Intravenous Contrast Given 03/05/24 2243)                                    Medical Decision Making Amount and/or Complexity of Data Reviewed Labs: ordered. Radiology: ordered.  Risk Prescription drug management. Decision regarding hospitalization.    Jheri Mitter is a 68 y.o. female with a past medical history significant for hypothyroidism, hypertension, CHF, COPD, diabetes, previous breast cancer, seizures, GERD, and has a tracheostomy who presents with hypoxia cough fatigue and shortness of breath.  According to patient, she was recently discharged in the hospital a week ago and was doing fairly well until she started having worsened breathing today.  She reports her oxygen  saturations were reading in the 30s at home and she does not take oxygen  at home.  She was having a nonproductive cough but feels like there may be something deep down in her chest.  She denies any sharp pain but it is slightly discomforting when she tries to take a deep  breath.  She reports some pleuritic discomfort.  Denies history of blood clots.  She reports no leg pain or leg swelling and otherwise denies nausea, vomiting, constipation, diarrhea, or urinary changes.  On arrival oxygen  saturations were 65% on room air and she is now on 5 L to maintain oxygen  saturations.  On exam, lungs had rales rhonchi and wheezing.  Will give a breathing treatment and steroids initially.  She will remain on oxygen .  Her chest was nontender and I do not appreciate a murmur.  Abdomen nontender.  Minimal edema in legs.  Patient is resting now that she is on oxygen  over her trach site.  Clinically I do feel she need admission given her new oxygen  requirement.  We will get workup including chest x-ray and labs and will check for COVID/flu/RSV given the ongoing pandemic.  With her history of heart failure get a BNP and we will get a CT PE study given her cancer history tachycardia hypoxia and pleuritic discomfort today.  Will get cardiac workup as well.  Anticipate admission after workup.  Patient's initial workup including chest x-ray and labs did not show critical finding however given the new hypoxia will call for admission.  She is negative her COVID also just RSV.  PE study still in process prior to admission and they will follow-up on the imaging results.  Family medicine will admit for further management.      Final diagnoses:  Hypoxia  COPD exacerbation (HCC)     Clinical Impression: 1. Hypoxia   2. COPD exacerbation (HCC)     Disposition: Admit  This note was prepared with assistance of Dragon voice recognition software. Occasional wrong-word or sound-a-like substitutions may have occurred due to the inherent limitations of voice recognition software.     Marialuisa Basara, Lonni PARAS, MD 03/05/24 [phone]

## 2024-03-05 NOTE — ED Triage Notes (Signed)
 Pt ambulatory to ER with c/o [facility] getting worse today.  Denies cough.

## 2024-03-06 ENCOUNTER — Encounter (HOSPITAL_COMMUNITY): Payer: Self-pay | Admitting: Family Medicine

## 2024-03-06 ENCOUNTER — Inpatient Hospital Stay (HOSPITAL_COMMUNITY)

## 2024-03-06 DIAGNOSIS — J9601 Acute respiratory failure with hypoxia: Secondary | ICD-10-CM

## 2024-03-06 DIAGNOSIS — I3139 Other pericardial effusion (noninflammatory): Secondary | ICD-10-CM

## 2024-03-06 DIAGNOSIS — R042 Hemoptysis: Secondary | ICD-10-CM | POA: Diagnosis present

## 2024-03-06 DIAGNOSIS — J0410 Acute tracheitis without obstruction: Secondary | ICD-10-CM | POA: Diagnosis not present

## 2024-03-06 DIAGNOSIS — J441 Chronic obstructive pulmonary disease with (acute) exacerbation: Secondary | ICD-10-CM | POA: Diagnosis not present

## 2024-03-06 DIAGNOSIS — R918 Other nonspecific abnormal finding of lung field: Secondary | ICD-10-CM | POA: Diagnosis present

## 2024-03-06 DIAGNOSIS — Z930 Tracheostomy status: Secondary | ICD-10-CM

## 2024-03-06 LAB — MRSA NEXT GEN BY PCR, NASAL: MRSA by PCR Next Gen: DETECTED — AB

## 2024-03-06 LAB — ECHOCARDIOGRAM COMPLETE
Area-P 1/2: 4.19 cm2
Calc EF: 77.5 %
Height: 67 in
S' Lateral: 2.2 cm
Single Plane A2C EF: 79 %
Single Plane A4C EF: 75.5 %
Weight: 3887.15 [oz_av]

## 2024-03-06 LAB — BASIC METABOLIC PANEL WITH GFR
Anion gap: 13 (ref 5–15)
BUN: 16 mg/dL (ref 8–23)
CO2: 23 mmol/L (ref 22–32)
Calcium: 8.3 mg/dL — ABNORMAL LOW (ref 8.9–10.3)
Chloride: 99 mmol/L (ref 98–111)
Creatinine, Ser: 1.07 mg/dL — ABNORMAL HIGH (ref 0.44–1.00)
GFR, Estimated: 57 mL/min — ABNORMAL LOW (ref >60)
Glucose, Bld: 265 mg/dL — ABNORMAL HIGH (ref 70–99)
Potassium: 4.7 mmol/L (ref 3.5–5.1)
Sodium: 135 mmol/L (ref 135–145)

## 2024-03-06 LAB — GLUCOSE, CAPILLARY
Glucose-Capillary: 238 mg/dL — ABNORMAL HIGH (ref 70–99)
Glucose-Capillary: 290 mg/dL — ABNORMAL HIGH (ref 70–99)
Glucose-Capillary: 230 mg/dL — ABNORMAL HIGH (ref 70–99)
Glucose-Capillary: 235 mg/dL — ABNORMAL HIGH (ref 70–99)

## 2024-03-06 LAB — CBC
HCT: 36.4 % (ref 36.0–46.0)
Hemoglobin: 11.3 g/dL — ABNORMAL LOW (ref 12.0–15.0)
MCH: 29.5 pg (ref 26.0–34.0)
MCHC: 31 g/dL (ref 30.0–36.0)
MCV: 95 fL (ref 80.0–100.0)
Platelets: 421 K/uL — ABNORMAL HIGH (ref 150–400)
RBC: 3.83 MIL/uL — ABNORMAL LOW (ref 3.87–5.11)
RDW: 13.2 % (ref 11.5–15.5)
WBC: 8.4 K/uL (ref 4.0–10.5)
nRBC: 0 % (ref 0.0–0.2)

## 2024-03-06 LAB — CBG MONITORING, ED: Glucose-Capillary: 170 mg/dL — ABNORMAL HIGH (ref 70–99)

## 2024-03-06 MED ORDER — ENOXAPARIN SODIUM 60 MG/0.6ML IJ SOSY
55.0000 mg | PREFILLED_SYRINGE | Freq: Every day | INTRAMUSCULAR | Status: DC
  Administered 2024-03-07 – 2024-03-08 (×2): 55 mg via SUBCUTANEOUS
  Filled 2024-03-06 (×3): qty 0.6

## 2024-03-06 MED ORDER — IPRATROPIUM-ALBUTEROL 0.5-2.5 (3) MG/3ML IN SOLN
3.0000 mL | Freq: Four times a day (QID) | RESPIRATORY_TRACT | Status: DC
Start: 2024-03-06 — End: 2024-03-07
  Administered 2024-03-06 – 2024-03-07 (×3): 3 mL via RESPIRATORY_TRACT
  Filled 2024-03-06 (×3): qty 3

## 2024-03-06 MED ORDER — CHLORHEXIDINE GLUCONATE CLOTH 2 % EX PADS
6.0000 | MEDICATED_PAD | Freq: Every evening | CUTANEOUS | Status: DC
  Administered 2024-03-06 – 2024-03-07 (×2): 6 via TOPICAL

## 2024-03-06 MED ORDER — PANTOPRAZOLE SODIUM 40 MG PO TBEC
40.0000 mg | DELAYED_RELEASE_TABLET | Freq: Every day | ORAL | Status: DC
  Administered 2024-03-06 – 2024-03-08 (×3): 40 mg via ORAL
  Filled 2024-03-06 (×3): qty 1

## 2024-03-06 MED ORDER — PREDNISONE 20 MG PO TABS
40.0000 mg | ORAL_TABLET | Freq: Every day | ORAL | Status: DC
  Administered 2024-03-06 – 2024-03-08 (×3): 40 mg via ORAL
  Filled 2024-03-06 (×3): qty 2

## 2024-03-06 MED ORDER — HYDROXYZINE HCL 25 MG PO TABS
25.0000 mg | ORAL_TABLET | Freq: Three times a day (TID) | ORAL | Status: DC | PRN
  Administered 2024-03-06 – 2024-03-07 (×3): 25 mg via ORAL
  Filled 2024-03-06 (×3): qty 1

## 2024-03-06 MED ORDER — VANCOMYCIN HCL 1500 MG/300ML IV SOLN
1500.0000 mg | INTRAVENOUS | Status: DC
  Administered 2024-03-06 – 2024-03-07 (×2): 1500 mg via INTRAVENOUS
  Filled 2024-03-06 (×3): qty 300

## 2024-03-06 MED ORDER — INSULIN ASPART 100 UNIT/ML IJ SOLN
0.0000 [IU] | Freq: Three times a day (TID) | INTRAMUSCULAR | Status: DC
  Administered 2024-03-06: 7 [IU] via SUBCUTANEOUS
  Administered 2024-03-06: 11 [IU] via SUBCUTANEOUS
  Administered 2024-03-06: 7 [IU] via SUBCUTANEOUS
  Administered 2024-03-07 (×2): 3 [IU] via SUBCUTANEOUS
  Administered 2024-03-07: 11 [IU] via SUBCUTANEOUS
  Administered 2024-03-08 (×2): 3 [IU] via SUBCUTANEOUS

## 2024-03-06 MED ORDER — INSULIN GLARGINE 100 UNIT/ML ~~LOC~~ SOLN
10.0000 [IU] | Freq: Every day | SUBCUTANEOUS | Status: DC
  Administered 2024-03-06 – 2024-03-07 (×2): 10 [IU] via SUBCUTANEOUS
  Filled 2024-03-06 (×3): qty 0.1

## 2024-03-06 MED ORDER — INSULIN ASPART 100 UNIT/ML IJ SOLN
0.0000 [IU] | Freq: Every day | INTRAMUSCULAR | Status: DC
  Administered 2024-03-06 – 2024-03-07 (×2): 2 [IU] via SUBCUTANEOUS

## 2024-03-06 MED ORDER — MUPIROCIN 2 % EX OINT
1.0000 | TOPICAL_OINTMENT | Freq: Two times a day (BID) | CUTANEOUS | Status: DC
  Administered 2024-03-07 – 2024-03-08 (×3): 1 via NASAL

## 2024-03-06 NOTE — Assessment & Plan Note (Addendum)
 Currently requiring 10 L supplemental O2 via tracheostomy. No home O2 requirement. Otherwise afebrile and hemodynamically stable. -Admit to FMTS, attending Dr. McDiarmid, progressive given significant hypoxemia and tracheostomy, required brief ICU stay during recent admission -Start broad spectrum antibiotics:  -IV Vancomycin  per pharmacy (9/21-)  -IV Cefepime  per pharmacy (9/21-)  -IV Flagyl  500mg  q8h for anaerobic coverage in setting of ?aspiration and tracheostomy (9/21-) -Continue supplemental oxygen  via trach, wean as tolerated. Goal 88-92% given hx COPD -Ordered repeat duoneb given initial improvement with this and persistent wheezing on exam -Duoneb PRN -Consider redosing steroids in AM -Continue home nebulizers starting in AM -Consider pulmonology consult if worsening

## 2024-03-06 NOTE — Assessment & Plan Note (Signed)
 Systolic murmur appreciated on exam, most recent echo 2024 showed TR and AR as well as small pericardial effusion (also noted on CTA on this admission). No current evidence of pericarditis or tamponade. -Echocardiogram ordered

## 2024-03-06 NOTE — Assessment & Plan Note (Signed)
 No evidence of PE. ?inflammatory vs neoplastic. Hx breast cancer and laryngeal cancer. Former smoker, 40 pack years. -Follow up CT recommended in 3 months -Consider pulm consult

## 2024-03-06 NOTE — Hospital Course (Signed)
 Deborah [name] is a 68 y.o. female with a past medical history of CHF, HFpEF, COPD, CKD stage 3a, diabetes, breast CA, seizures, hypothyroidism, HTN, GERD, and hx of tracheostomy who was admitted to the [facility] Medicine Teaching Service at [facility] for acute hypoxic respiratory failure secondary to aspiration pneumonitis and tracheitis. Patient was recently discharged from the hospital from 02/22/24-02/28/24 for respiratory distress due to COVID pneumonia, requiring brief ICU stay. Hospital course is outlined below by problem.   Acute hypoxemic respiratory failure Aspiration pneumonitis Tracheitis Patient presented with worsening shortness of breath, pleuritic chest pain, non-productive cough, hemoptysis and hypoxia (desats into 60s-80s). Patient noted to be in severe respiratory distress with O2 saturation in ED triage noted to be 38%. Cardiac workup was unremarkable. CXR with no acute process. CT PE notable for new fluid obstruction of the left upper lobe bronchus with patchy consolidation consistent with LUL aspiration pneumonitis, tracheitis, and RLL opacity. Patient started on supplemental blow-by oxygen , steroids, DuoNebs, and Solu-Medrol  and home nebulizers. Started IV antibiotics Vancomycin  (9/21-9/23), Cefepime  (9/21-9/23), Flagyl  (9/21). Pulmonology was consulted and were in agreement with current management plan; patient scheduled with outpatient pulm follow-up on 9/30. At discharge, patient was transitioned to oral Augmentin  (9/23-9/28) and Doxycycline  (9/23-9/28). Continue prednisone  40 mg daily course (9/20-9/24). Home O2 supplies***  Hemoptysis / RLL Lung opacity on imaging No evidence of pulmonary embolism on CT. 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 Patient is also a 40-pack year former smoker. Recommended repeat CT scan in 3 months for monitoring of nodule.  Heart murmur / pericardial effusion Systolic murmur appreciated on  physical exam. Echocardiogram on 9/21 with LVEF 70-75% similar to prior, no regional wall motion abnormalities, mild to moderate aortic valve regurgitation, and small pericardial effusion.  Hypertension Patient with history of hypertension, elevated BP's during admission with systolic high of upper 100s-low 200s. Patient was continued on home Losartan  50 mg daily and Coreg  12.5 mg twice daily. Added amlodipine  5 mg to regimen.  Other conditions that were chronic and stable: Type 2 diabetes, HLD, Anxiety, Hypothyroidism, TSH  Issues for follow up: New 14 x 9 mm pleural-based right lower lobe opacity. Possible inflammatory nodule but 40-month follow-up CT recommended. Increased mediastinal and hilar adenopathy, most likely reactive, alternatively neoplastic.

## 2024-03-06 NOTE — Assessment & Plan Note (Addendum)
 Admitted on 10 L and now on 8 L supplemental O2 via tracheostomy. No home O2 requirement. CTA PE with findings of tracheitis. Otherwise afebrile and hemodynamically stable. -Pulmonology consulted given patient's extensive history, appreciate recommendations -Start broad spectrum antibiotics:  -IV Vancomycin  per pharmacy (9/21-), can discontinue if MRSA swab negative  -IV Cefepime  per pharmacy (9/21-), can likely de-escalate to Rocephin if continues to improve  -Will stop IV Flagyl  500mg  q8h given unlikely required, even in aspiration (9/21) -Continue supplemental oxygen  via trach, wean as tolerated. Goal 88-92% given hx COPD -Duoneb scheduled every 6 hours, de-escalate when improving -Status post 1 dose of methylprednisolone  125 mg, continue prednisone  40 mg daily course (9/20-9/24) -Continue home nebs daily

## 2024-03-06 NOTE — Progress Notes (Signed)
 Daily Progress Note Intern Pager: [phone]  Patient name: Deborah [name] Medical record number: [id number] Date of birth: [birth year] Age: 68 y.o. Gender: female  Primary Care Provider: Cleotilde Lukes, DO Consultants: None Code Status: Full  Pt Overview and Major Events to Date:  9/20-admitted  Medical Decision Making:  Deborah [name] is a 68 year old female presenting with shortness of breath, cough, hemoptysis and low oxygen  saturation at home. 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  Assessment & Plan Acute hypoxemic respiratory failure (HCC) Aspiration pneumonitis (HCC) Tracheitis COPD (chronic obstructive pulmonary disease) (HCC) Admitted on 10 L and now on 8 L supplemental O2 via tracheostomy. No home O2 requirement. CTA PE with findings of tracheitis. Otherwise afebrile and hemodynamically stable. -Pulmonology consulted given patient's extensive history, appreciate recommendations -Start broad spectrum antibiotics:  -IV Vancomycin  per pharmacy (9/21-), can discontinue if MRSA swab negative  -IV Cefepime  per pharmacy (9/21-), can likely de-escalate to Rocephin if continues to improve  -Will stop IV Flagyl  500mg  q8h given unlikely required, even in aspiration (9/21) -Continue supplemental oxygen  via trach, wean as tolerated. Goal 88-92% given hx COPD -Duoneb scheduled every 6 hours, de-escalate when improving -Status post 1 dose of methylprednisolone  125 mg, continue prednisone  40 mg daily course (9/20-9/24) -Continue home nebs daily Hemoptysis Opacity of lung on imaging study No evidence of PE. ?inflammatory vs  neoplastic. Hx breast cancer and laryngeal cancer. Former smoker, 40 pack years. -Follow up CT recommended in 3 months -Consider pulm consult Heart murmur Pericardial effusion Systolic murmur appreciated on exam, most recent echo 2024 showed TR and AR as well as small pericardial effusion (also noted on CTA on this admission). No current evidence of pericarditis or tamponade. -Echocardiogram ordered Chronic health problem T2DM - resistant SSI, CBG checks ACHS, home Lantus  35 units held on admission, consider restarting this.  Most recent A1c 6.5 HTN -home losartan  50 mg daily, Coreg  12.5 mg twice daily HLD -Home Crestor  40 mg daily Anxiety -Home Lexapro  20 mg, bupropion  150 mg daily Hypothyroidism -Home levothyroxine  225 mcg daily.  Most recent TSH 9.4 on 9/13, her dose was adjusted during her recent hospital stay and will be due for recheck in about 1 month  FEN/GI: Regular diet PPx: Lovenox  Dispo:Home pending clinical improvement .  Subjective:  Improved this morning after getting antibiotics.  Objective: Temp:  [98.1 F (36.7 C)-99.2 F (37.3 C)] 98.2 F (36.8 C) (09/21 0752) Pulse Rate:  [68-113] 89 (09/21 0752) Resp:  [13-24] 13 (09/21 0752) BP: (134-198)/(59-146) 167/87 (09/21 0752) SpO2:  [65 %-99 %] 99 % (09/21 0803) FiO2 (%):  [30 %-35 %] 30 % (09/21 0803) Weight:  [110.2 kg] 110.2 kg (09/20 1612) Physical Exam: General: Lying in bed, no acute distress Cardiovascular: Regular rate Respiratory: No increased work of breathing on 8 L blow-by oxygen  to trach, speaking comfortably Abdomen: Nondistended Extremities: Moves all extremities grossly equally  Laboratory: Most recent CBC Lab Results  Component Value Date   WBC 8.4 03/06/2024   HGB 11.3 (L) 03/06/2024   HCT 36.4 03/06/2024   MCV 95.0 03/06/2024   PLT 421 (H) 03/06/2024   Most recent BMP    Latest Ref Rng & Units 03/06/2024    4:19 AM  BMP  Glucose 70 - 99 mg/dL 734   BUN 8 - 23 mg/dL 16   Creatinine  9.55 - 1.00 mg/dL 8.92   Sodium 864 - 854 mmol/L 135   Potassium 3.5 - 5.1 mmol/L 4.7   Chloride 98 - 111 mmol/L 99   CO2 22 - 32 mmol/L 23   Calcium  8.9 - 10.3 mg/dL 8.3    Blood cultures pending  Imaging/Diagnostic Tests: CTA PE IMPRESSION: 1. No evidence of arterial dilatation or embolus. 2. Aortic and trace single vessel coronary artery atherosclerosis. 3. Cardiomegaly with chronic small pericardial effusion. 4. Increased fluid and debris in the thoracic trachea and increased tracheal wall thickening likely due to tracheitis, with increased wall thickening in the main bronchi and central airways as well. 5. Increased wall thickening in the esophagus. No masslike thickening. 6. Fluid obstruction of the distal left main bronchus and left lower lobe bronchus with chronically collapsed left lower lobe with fluid in the left lower lobe bronchi. 7. New fluid obstruction of the left upper lobe bronchus with patchy consolidation in the lingular segments consistent with pneumonia or aspiration. 8. New 14 x 9 mm pleural-based right lower lobe opacity. Possible inflammatory nodule but 76-month follow-up CT recommended. 9. Trace pleural effusions. 10. Increased mediastinal and hilar adenopathy, most likely reactive, alternatively neoplastic. 11. Hepatic steatosis. 12. Emphysema.  Tharon Lung, MD 03/06/2024, 8:04 AM  PGY-3, [facility] FPTS Intern pager: [phone], text pages welcome Secure chat group [facility] Teaching Service

## 2024-03-06 NOTE — ED Notes (Signed)
 Pt able to ambulate to toilet with transportable O2, needed help having O2 switched over to tank otherwise independent with walking.

## 2024-03-06 NOTE — Assessment & Plan Note (Signed)
 T2DM - resistant SSI, CBG checks ACHS, home Lantus  35 units held on admission, consider restarting this.  Most recent A1c 6.5 HTN -home losartan  50 mg daily, Coreg  12.5 mg twice daily HLD -Home Crestor  40 mg daily Anxiety -Home Lexapro  20 mg, bupropion  150 mg daily Hypothyroidism -Home levothyroxine  225 mcg daily.  Most recent TSH 9.4 on 9/13, her dose was adjusted during her recent hospital stay and will be due for recheck in about 1 month

## 2024-03-06 NOTE — Consult Note (Signed)
 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  During this stay she was placed on broad-spectrum antibiotics as well as systemic steroids and also baricitinib . On time of discharge was ambulatory back on room air.  Presents back to the emergency room on 9/20 for increased work of breathing, as well as decreased pulse oximetry.  Patient noting increased cough some hemoptysis, and increased wheezing and rhonchi.  Follow-up COVID and influenza were negative a CTA was obtained this was negative for pulmonary emboli, showed right greater than left airspace disease as well as increased fluid in the thoracic trachea with thickening felt secondary to tracheitis as well as fluid obstruction in the distal left main bronchus with concomitant collapse.  Was admitted to the internal medicine service, treated with supplemental oxygen  and IV antibiotics , Cultures have been negative to date..  Pulmonary has been asked to see because of her history of laryngectomy and new oxygen  need  Pertinent  Medical History  COPD, COVID, metabolic encephalopathy, type 2 diabetes, hypertension, hypothyroidism, prior laryngectomy for laryngeal cancer, CKD 3A, history of breast cancer, history of heart failure with preserved EF,   Significant Hospital Events: Including procedures, antibiotic start and stop dates in addition to other pertinent events     Interim History / Subjective:  Feels better  Objective    Blood pressure (!) 154/81, pulse 79, temperature 98.1 F (36.7 C),  temperature source Oral, resp. rate (!) 28, height 5' 7 (1.702 m), weight 110.2 kg, SpO2 96%.    FiO2 (%):  [30 %-35 %] 30 %   Intake/Output Summary (Last 24 hours) at 03/06/2024 1152 Last data filed at 03/06/2024 1000 Gross per 24 hour  Intake 193.26 ml  Output --  Net 193.26 ml   Filed Weights   03/05/24 1612  Weight: 110.2 kg    Examination: General: sitting up in bed no distress HENT: NCAT the laryngectomy stoma is unremarkable.  Lungs: some occ wheeze Cardiovascular: rrr Abdomen: soft Extremities: not tender Neuro: oriented GU: voids  Resolved problem list   Assessment and Plan  Laryngectomy with history of laryngeal cancer History of COPD Tracheitis Recent COVID Hcap versus aspiration CKD stage IIIa History of breast cancer Heart failure preserved EF   Pulmonary problem list Acute hypoxic respiratory failure secondary to HCAP versus aspiration with associated acute tracheitis, superimposed on COPD Plan Agree with current antibiotic strategy Follow-up pending cultures, if MRSA smear negative can discontinue vancomycin  Scheduled bronchodilators Agree with systemic steroids Pulse oximetry He sees Dr. Kara in our clinic and already has f/u on 30th Certainly a possibility he may need to go home on oxygen  Would check daily walking oximetry to determine need  Laryngectomy with remote history of laryngeal cancer Plan No intervention required   Nothing really further to add Signing off Labs   CBC: Recent Labs  Lab 03/05/24 1800 03/06/24 0419  WBC 8.0 8.4  NEUTROABS 5.4  --   HGB 12.0 11.3*  HCT 38.5 36.4  MCV 95.1 95.0  PLT 398 421*    Basic Metabolic Panel: Recent Labs  Lab 03/05/24 1800 03/06/24  0419  NA 138 135  K 4.6 4.7  CL 102 99  CO2 28 23  GLUCOSE 133* 265*  BUN 16 16  CREATININE 0.92 1.07*  CALCIUM  8.5* 8.3*   GFR: Estimated Creatinine Clearance: 66.1 mL/min (A) (by C-G formula based on SCr of 1.07 mg/dL (H)). Recent Labs   Lab 03/05/24 1800 03/05/24 1805 03/05/24 2019 03/06/24 0419  WBC 8.0  --   --  8.4  LATICACIDVEN  --  0.6 0.5  --     Liver Function Tests: Recent Labs  Lab 03/05/24 1800  AST 34  ALT 32  ALKPHOS 97  BILITOT 0.5  PROT 8.0  ALBUMIN  3.5   No results for input(s): LIPASE, AMYLASE in the last 168 hours. No results for input(s): AMMONIA in the last 168 hours.  ABG    Component Value Date/Time   PHART 7.445 08/02/2012 1959   PCO2ART 39.8 08/02/2012 1959   PO2ART 94.5 08/02/2012 1959   HCO3 21.3 05/23/2023 1523   TCO2 28 02/21/2024 1350   ACIDBASEDEF 2.0 05/23/2023 1523   O2SAT 97 05/23/2023 1523     Coagulation Profile: No results for input(s): INR, PROTIME in the last 168 hours.  Cardiac Enzymes: No results for input(s): CKTOTAL, CKMB, CKMBINDEX, TROPONINI in the last 168 hours.  HbA1C: HbA1c, POC (controlled diabetic range)  Date/Time Value Ref Range Status  01/25/2024 09:01 AM 6.5 0.0 - 7.0 % Final  10/08/2023 03:09 PM 10.5 (A) 0.0 - 7.0 % Final    CBG: Recent Labs  Lab 02/28/24 1237 03/06/24 0101 03/06/24 0751 03/06/24 1125  GLUCAP 159* 170* 238* 290*    Review of Systems:   Review of Systems  Constitutional:  Positive for fever.  HENT: Negative.    Eyes: Negative.   Respiratory:  Positive for hemoptysis.        Improved. Cough now not productive Hemoptysis resolved   Cardiovascular: Negative.   Gastrointestinal: Negative.   Genitourinary: Negative.   Skin: Negative.   Neurological: Negative.   Endo/Heme/Allergies: Negative.       Past Medical History:  She,  has a past medical history of Anemia, Anxiety, Arthritis, Asthma, Breast cancer (HCC), Bronchitis, Carpal tunnel syndrome (03/16/2018), Chronic pain (01/20/2012), COPD (chronic obstructive pulmonary disease) (HCC), Depression, Diabetes mellitus without complication (HCC), Diverticulosis (11/15/2012), DOE (dyspnea on exertion) (04/28/2014), Esophageal stricture, FB  bronchus, Former smoker (03/19/2011), GERD (gastroesophageal reflux disease), Heart murmur, History of laryngectomy, History of radiation therapy (11/15/18- 12/06/18), radiation therapy (09/03/10 to 10/16/2010), Hypertension, Hypothyroid, Internal hemorrhoid (11/15/2012), 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   Surgical History:   Past Surgical History:  Procedure Laterality Date   BREAST LUMPECTOMY Right 07/06/2018   BREAST LUMPECTOMY WITH RADIOACTIVE SEED LOCALIZATION Right 07/06/2018   Procedure: RIGHT BREAST LUMPECTOMY WITH RADIOACTIVE SEED LOCALIZATION;  Surgeon: Ebbie Cough, MD;  Location: [facility] OR;  Service: General;  Laterality: Right;   COLONOSCOPY N/A 11/15/2012   Procedure: COLONOSCOPY;  Surgeon: Princella CHRISTELLA Nida, MD;  Location: [facility];  Service: Endoscopy;  Laterality: N/A;   CRYOTHERAPY  05/11/2021   Procedure: CRYOTHERAPY;  Surgeon: Brenna Adine CROME, DO;  Location: [facility];  Service: Pulmonary;;   DENTAL RESTORATION/EXTRACTION WITH X-RAY     ESOPHAGEAL DILATION N/A 09/09/2019   Procedure: ESOPHAGEAL DILATION;  Surgeon: Jesus Oliphant, MD;  Location: [facility];  Service: ENT;  Laterality: N/A;   ESOPHAGEAL DILATION N/A 10/05/2019   Procedure: ESOPHAGEAL DILATION;  Surgeon: Jesus Oliphant, MD;  Location: [facility];  Service: ENT;  Laterality: N/A;  via tracheostomy   ESOPHAGEAL DILATION N/A 02/27/2020   Procedure: ESOPHAGEAL DILATION;  Surgeon: Jesus Oliphant, MD;  Location: [facility];  Service: ENT;  Laterality: N/A;   ESOPHAGOGASTRODUODENOSCOPY (EGD) WITH PROPOFOL  N/A 08/12/2018    Procedure: ESOPHAGOGASTRODUODENOSCOPY (EGD) WITH PROPOFOL ;  Surgeon: Legrand Victory LITTIE DOUGLAS, MD;  Location: [facility] END[facility]COPY;  Service: Gastroenterology;  Laterality: N/A;   ESOPHAGOSCOPY  06/21/2012   Procedure: ESOPHAGOSCOPY;  Surgeon: Oliphant Jesus, MD;  Location: [facility];  Service: ENT;  Laterality: N/A;   ESOPHAGOSCOPY N/A 06/24/2021   Procedure: ESOPHAGOSCOPY WITH DILATATION;  Surgeon: Jesus Oliphant, MD;  Location: [facility];  Service: ENT;  Laterality: N/A;   ESOPHAGOSCOPY N/A 02/14/2022   Procedure: ESOPHAGOSCOPY WITH DILATATION;  Surgeon: Jesus Oliphant, MD;  Location: [facility] OR;  Service: ENT;  Laterality: N/A;   ESOPHAGOSCOPY N/A 08/11/2022   Procedure: ESOPHAGOSCOPY WITH DILATATION;  Surgeon: Jesus Oliphant, MD;  Location[facility] OR;  Service: ENT;  Laterality: N/A;   ESOPHAGOSCOPY WITH DILITATION N/A 09/21/2014   Procedure: ESOPHAGOSCOPY WITH DILITATION;  Surgeon: Oliphant Jesus, MD;  Location: [facility] OR;  Service: ENT;  Laterality: N/A;   ESOPHAGOSCOPY WITH DILITATION N/A 07/04/2016   Procedure: ESOPHAGOSCOPY WITH DILITATION;  Surgeon: Oliphant Jesus, MD;  Location: [facility] OR;  Service: ENT;  Laterality: N/A;   ESOPHAGOSCOPY WITH DILITATION N/A 12/01/2017   Procedure: ESOPHAGOSCOPY WITH DILITATION;  Surgeon: Jesus Oliphant, MD;  Location: [facility] OR;  Service: ENT;  Laterality: N/A;   ESOPHAGOSCOPY WITH DILITATION N/A 04/19/2018   Procedure: ESOPHAGOSCOPY WITH DILITATION;  Surgeon: Jesus Oliphant, MD;  Location: [facility] OR;  Service: ENT;  Laterality: N/A;   ESOPHAGOSCOPY WITH DILITATION N/A 03/07/2019   Procedure: Esophagoscopy with dilatation;  Surgeon: Jesus Oliphant, MD;  Location: [facility];  Service: ENT;  Laterality: N/A;   ESOPHAGOSCOPY WITH DILITATION N/A 07/16/2020   Procedure: ESOPHAGOSCOPY WITH DILATION;  Surgeon: Jesus Oliphant, MD;  Location:  SURGERY CENTER;  Service: ENT;  Laterality: N/A;   ESOPHAGOSCOPY WITH DILITATION N/A 10/08/2020   Procedure:  ESOPHAGOSCOPY With Dilation;  Surgeon: Jesus Oliphant, MD;  Location:  SURGERY CENTER;  Service: ENT;  Laterality: N/A;  21 French to 68 french   ESOPHAGOSCOPY WITH DILITATION N/A 08/05/2023   Procedure: ESOPHAGOSCOPY WITH DILITATION;  Surgeon: Jesus Oliphant, MD;  Location: [facility] OR;  Service: ENT;  Laterality: N/A;   FLEXIBLE BRONCHOSCOPY  01/08/2018       FOREIGN BODY REMOVAL  05/11/2021   Procedure: FOREIGN BODY REMOVAL;  Surgeon: Brenna Adine LITTIE, DO;  [facility]: [facility];  Service: Pulmonary;;   FOREIGN BODY REMOVAL BRONCHIAL  10/02/2011   Procedure: REMOVAL FOREIGN BODY BRONCHIAL;  Surgeon: Merilee Kraft, MD;  Location: [facility] OR;  Service: ENT;  Laterality: N/A;   FOREIGN BODY REMOVAL BRONCHIAL N/A 01/08/2018   Procedure: REMOVAL FOREIGN BODY BRONCHIAL;  Surgeon: Arlana Arnt, MD;  Location[facility];  Service: ENT;  Laterality: N/A;   HEMOSTASIS CONTROL  05/11/2021   Procedure: HEMOSTASIS CONTROL;  Surgeon: Brenna Adine LITTIE, DO;  Location: [facility];  Service: Pulmonary;;   LARYNGECTOMY     Porta cath removal     PORTACATH PLACEMENT  09/17/2010   Tip in cavoatrial junction   RE-EXCISION OF BREAST CANCER,SUPERIOR MARGINS Right 07/29/2018  Procedure: RE-EXCISION OF RIGHT BREAST MEDIAL MARGINS;  Surgeon: Ebbie Cough, MD;  Location: [facility] OR;  Service: General;  Laterality: Right;   RIGID ESOPHAGOSCOPY N/A 03/19/2019   Procedure: FLEXIBLE ESOPHAGOSCOPY;  Surgeon: Arlana Arnt, MD;  Location: [facility] OR;  Service: ENT;  Laterality: N/A;   STOMAPLASTY N/A 10/21/2016   Procedure: LINNA;  Surgeon: Jesus Oliphant, MD;  Location: [facility] OR;  Service: ENT;  Laterality: N/A;   TOTAL KNEE ARTHROPLASTY Right 12/12/2019   Procedure: RIGHT TOTAL KNEE ARTHROPLASTY;  Surgeon: Jerri Kay HERO, MD;  [facility];  Service: Orthopedics;  Laterality: Right;   TOTAL KNEE ARTHROPLASTY Left 04/01/2021   Procedure: LEFT TOTAL KNEE ARTHROPLASTY;  Surgeon: Jerri Kay HERO, MD;  Location[facility];   Service: Orthopedics;  Laterality: Left;   TRACHEAL DILITATION  07/16/2011   Procedure: TRACHEAL DILITATION;  Surgeon: Oliphant VEAR Jesus, MD;  Location: [facility];  Service: ENT;  Laterality: N/A;  dilation of tracheal stoma and replacement of stoma tube   TRACHEAL ESOPHAGEAL PROSTHESIS (TEP) CHANGE N/A 08/11/2022   Procedure: TRACHEAL ESOPHAGEAL PROSTHESIS (TEP) CHANGE;  Surgeon: Jesus Oliphant, MD;  Location: [facility] OR;  Service: ENT;  Laterality: N/A;  ATOS TEP SET   TUBAL LIGATION  1982   VIDEO BRONCHOSCOPY Bilateral 05/11/2021   Procedure: VIDEO BRONCHOSCOPY WITHOUT FLUORO;  Surgeon: Brenna Adine CROME, DO;  Location: [facility];  Service: Pulmonary;  Laterality: Bilateral;  w/ cryotherapy, Foreign body extraction     Social History:   reports that she quit smoking about 13 years ago. Her smoking use included cigarettes. She started smoking about 33 years ago. She has a 40 pack-year smoking history. She has never used smokeless tobacco. She reports current alcohol use. She reports that she does not use drugs.   Family History:  Her family history includes Cancer in her brother and sister; Diabetes in her sister; Heart disease in her father, mother, and sister; Liver disease in her maternal grandmother. There is no history of Breast cancer, Colon cancer, Esophageal cancer, Rectal cancer, Stomach cancer, or Colon polyps.   Allergies Allergies  Allergen Reactions   Lisinopril  Cough     Home Medications  Prior to Admission medications   Medication Sig Start Date End Date Taking? Authorizing Provider  amphetamine-dextroamphetamine (ADDERALL) 20 MG tablet Take 20 mg by mouth 2 (two) times daily. 02/05/24  Yes [provider]  budesonide  (PULMICORT ) 0.5 MG/2ML nebulizer solution Take 2 mLs (0.5 mg total) by nebulization daily. 09/17/23  Yes Cleotilde Lukes, DO  carvedilol  (COREG ) 12.5 MG tablet Take 1 tablet (12.5 mg total) by mouth 2 (two) times daily with a meal. 08/13/23  Yes Cleotilde Lukes, DO   EQ ALLERGY RELIEF, CETIRIZINE , 10 MG tablet Take 1 tablet by mouth once daily 09/11/23  Yes Cleotilde Lukes, DO  escitalopram  (LEXAPRO ) 20 MG tablet Take 1 tablet (20 mg total) by mouth daily. Patient taking differently: Take 20 mg by mouth at bedtime. 02/28/24  Yes Mabe, Elna, MD  fluticasone  (FLONASE ) 50 MCG/ACT nasal spray Place 1 spray into both nostrils 2 (two) times daily. 02/01/24  Yes Cleotilde Lukes, DO  formoterol  (PERFOROMIST ) 20 MCG/2ML nebulizer solution Take 2 mLs (20 mcg total) by nebulization 2 (two) times daily. 09/17/23  Yes Cleotilde Lukes, DO  insulin  lispro (HUMALOG  KWIKPEN) 100 UNIT/ML KwikPen Inject 10 Units into the skin 3 (three) times daily. Patient taking differently: Inject 10 Units into the skin 3 (three) times daily after meals. 10/23/23  Yes McDiarmid, Krystal BIRCH, MD  LANTUS  LYNFORD  100 UNIT/ML Solostar Pen INJECT SUBCUTANEOUSLY 35 UNITS  DAILY 02/08/24  Yes Miller, Samantha, DO  levothyroxine  (SYNTHROID ) 75 MCG tablet Take 3 tablets (225 mcg total) by mouth daily at 6 (six) AM. 02/28/24  Yes Mabe, Elna, MD  losartan  (COZAAR ) 50 MG tablet Take 1 tablet (50 mg total) by mouth daily. 11/17/23  Yes McDiarmid, Krystal BIRCH, MD  metFORMIN  (GLUCOPHAGE -XR) 500 MG 24 hr tablet TAKE 1 TABLET BY MOUTH DAILY  WITH BREAKFAST 09/28/23  Yes Cleotilde Lukes, DO  montelukast  (SINGULAIR ) 10 MG tablet TAKE 1 TABLET BY MOUTH AT BEDTIME 09/11/23  Yes Miller, Samantha, DO  nystatin  cream (MYCOSTATIN ) Apply 1 Application topically daily as needed (rash under breast stomach).   Yes [provider]  ondansetron  (ZOFRAN ) 4 MG tablet Take 1 tablet (4 mg total) by mouth every 8 (eight) hours as needed for nausea or vomiting. 02/01/24  Yes Cleotilde Lukes, DO  revefenacin  (YUPELRI ) 175 MCG/3ML nebulizer solution Take 3 mLs (175 mcg total) by nebulization daily. Patient taking differently: Take 175 mcg by nebulization daily as needed (Resp symptoms). 12/30/23  Yes Kara Dorn NOVAK, MD  rosuvastatin   (CRESTOR ) 40 MG tablet TAKE 1 TABLET BY MOUTH DAILY 01/05/24  Yes Cleotilde Lukes, DO  Semaglutide ,0.25 or 0.5MG /DOS, 2 MG/3ML SOPN Inject 0.5 mg into the skin once a week. Patient taking differently: Inject 0.5 mg into the skin once a week. Inject subcutaneously on Wednesdays. 01/25/24  Yes McDiarmid, Krystal BIRCH, MD  buPROPion  (WELLBUTRIN  XL) 150 MG 24 hr tablet TAKE 1 TABLET BY MOUTH DAILY Patient not taking: Reported on 03/06/2024 10/28/23   Cleotilde Lukes, DO  levothyroxine  (SYNTHROID ) 200 MCG tablet Take 200 mcg by mouth daily. Patient not taking: Reported on 03/06/2024 02/28/24   [provider]     Critical care time: na

## 2024-03-06 NOTE — Progress Notes (Signed)
 PT Cancellation Note  Patient Details Name: Deborah [name] MRN: [id number] DOB: 23-Feb-1956   Cancelled Treatment:    Reason Eval/Treat Not Completed: PT screened, no needs identified, will sign off. Pt independent with mobility per OT.   Rodgers ORN [facility] 03/06/2024, 1:37 PM Rodgers Opal PT Acute [employer] [phone]

## 2024-03-06 NOTE — Evaluation (Signed)
 Occupational Therapy Evaluation & Discharge Patient Details Name: Deborah [name] MRN: [id number] DOB: 1956/02/04 Today's Date: 03/06/2024   History of Present Illness   Pt is a  68 y.o. female admitted 9/20 with SOB. Chest x-ray negative for COVID and PE. Recent admission 9/8 d/t covid pneumonia. PMH: COPD/asthma, laryngectomy, HTN, GERD, hypothyroidism, depression, breast cancer, DM2, tracheostomy     Clinical Impressions Pt admitted based on above, and was seen based on problem list below. PTA pt was independent with ADLs and IADLs. Today pt is at her functional baseline for ADLs and mobility. Pt able to complete LB dressing, standing ADLs, and ambulate 343ft in hallway ind. Pt received on 8L supplemental O2, during mobility, poor inconsistent pleth, but increased to 10L. During mobility pt denied any SOB or fatigue. Replaced on 8L in room pt stating at 99%. Despite significant O2 requirement pt mobilizing well and with no signs of decreased activity tolerance or balance. Pt reporting no self-care or mobility concerns upon d/c, no follow up OT needs. Educated pt on importance of frequent mobility with mobility specialist or nursing staff,  during admission to maintain functional status. No further acute needs identified, OT is signing off on this pt.       If plan is discharge home, recommend the following:   Assist for transportation     Functional Status Assessment   Patient has not had a recent decline in their functional status     Equipment Recommendations   None recommended by OT      Precautions/Restrictions   Precautions Precautions: None Restrictions Weight Bearing Restrictions Per Provider Order: No     Mobility Bed Mobility Overal bed mobility: Modified Independent       General bed mobility comments: HOB elevated, no assist    Transfers Overall transfer level: Independent Equipment used: None       General transfer comment: Ambulated  ~346ft in hallway no AD, no LOB, tolerating dynamic challenges      Balance Overall balance assessment: Independent         ADL either performed or assessed with clinical judgement   ADL Overall ADL's : At baseline;Modified independent     General ADL Comments: Pt mod I, able to manage own trach, LB dressing     Vision Baseline Vision/History: 1 Wears glasses Vision Assessment?: No apparent visual deficits            Pertinent Vitals/Pain Pain Assessment Pain Assessment: No/denies pain     Extremity/Trunk Assessment Upper Extremity Assessment Upper Extremity Assessment: Overall WFL for tasks assessed   Lower Extremity Assessment Lower Extremity Assessment: Overall WFL for tasks assessed   Cervical / Trunk Assessment Cervical / Trunk Assessment: Normal   Communication Communication Communication: Impaired Factors Affecting Communication: Trach/intubated   Cognition Arousal: Alert Behavior During Therapy: WFL for tasks assessed/performed Cognition: No apparent impairments       Following commands: Intact       Cueing  General Comments   Cueing Techniques: Verbal cues  Pt on 10L via trach, poor pleth during mobility, post mobility pt on 8L stating at 99%, no SOB           Home Living Family/patient expects to be discharged to:: Private residence Living Arrangements: Spouse/significant other Available Help at Discharge: Family Type of Home: House Home Access: Stairs to enter Secretary/administrator of Steps: 3 Entrance Stairs-Rails: Right Home Layout: One level     Bathroom Shower/Tub: Chief Strategy Officer: Standard  Home Equipment: Agricultural consultant (2 wheels);Rollator (4 wheels)          Prior Functioning/Environment Prior Level of Function : Independent/Modified Independent;Working/employed         Mobility Comments: No AD ADLs Comments: Ind, was working as Buyer, retail Problem List: Cardiopulmonary status  limiting activity   OT Treatment/Interventions: Self-care/ADL training;Therapeutic exercise;Energy conservation;DME and/or AE instruction;Therapeutic activities;Patient/family education;Balance training      OT Goals(Current goals can be found in the care plan section)   Acute Rehab OT Goals Patient Stated Goal: To go home OT Goal Formulation: All assessment and education complete, DC therapy Time For Goal Achievement: 03/20/24 Potential to Achieve Goals: Good   OT Frequency:  Min 2X/week       AM-PAC OT 6 Clicks Daily Activity     Outcome Measure Help from another person eating meals?: None Help from another person taking care of personal grooming?: None Help from another person toileting, which includes using toliet, bedpan, or urinal?: None Help from another person bathing (including washing, rinsing, drying)?: None Help from another person to put on and taking off regular upper body clothing?: None Help from another person to put on and taking off regular lower body clothing?: None 6 Click Score: 24   End of Session Equipment Utilized During Treatment: Oxygen  Nurse Communication: Mobility status  Activity Tolerance: Patient tolerated treatment well Patient left: in bed;with call bell/phone within reach  OT Visit Diagnosis: Other (comment) (Cardiopulmonary)                Time: 8955-8892 OT Time Calculation (min): 23 min Charges:  OT General Charges $OT Visit: 1 Visit OT Evaluation $OT Eval Moderate Complexity: 1 Mod  Rawlin Reaume C, OT  Acute Rehabilitation Services Office [phone] Secure chat preferred   Adrianne GORMAN Savers 03/06/2024, 1:10 PM

## 2024-03-06 NOTE — Plan of Care (Signed)
 Problem: Education: Goal: Ability to describe self-care measures that may prevent or decrease complications (Diabetes Survival Skills Education) will improve Outcome: Not Progressing Goal: Individualized Educational Video(s) Outcome: Not Progressing   Problem: Coping: Goal: Ability to adjust to condition or change in health will improve Outcome: Not Progressing   Problem: Fluid Volume: Goal: Ability to maintain a balanced intake and output will improve Outcome: Not Progressing   Problem: Health Behavior/Discharge Planning: Goal: Ability to identify and utilize available resources and services will improve Outcome: Not Progressing Goal: Ability to manage health-related needs will improve Outcome: Not Progressing   Problem: Metabolic: Goal: Ability to maintain appropriate glucose levels will improve Outcome: Not Progressing   Problem: Nutritional: Goal: Maintenance of adequate nutrition will improve Outcome: Not Progressing Goal: Progress toward achieving an optimal weight will improve Outcome: Not Progressing   Problem: Skin Integrity: Goal: Risk for impaired skin integrity will decrease Outcome: Not Progressing   Problem: Tissue Perfusion: Goal: Adequacy of tissue perfusion will improve Outcome: Not Progressing   Problem: Education: Goal: Knowledge of General Education information will improve Description: Including pain rating scale, medication(s)/side effects and non-pharmacologic comfort measures Outcome: Not Progressing   Problem: Health Behavior/Discharge Planning: Goal: Ability to manage health-related needs will improve Outcome: Not Progressing   Problem: Clinical Measurements: Goal: Ability to maintain clinical measurements within normal limits will improve Outcome: Not Progressing Goal: Will remain free from infection Outcome: Not Progressing Goal: Diagnostic test results will improve Outcome: Not Progressing Goal: Respiratory complications will  improve Outcome: Not Progressing Goal: Cardiovascular complication will be avoided Outcome: Not Progressing   Problem: Activity: Goal: Risk for activity intolerance will decrease Outcome: Not Progressing   Problem: Nutrition: Goal: Adequate nutrition will be maintained Outcome: Not Progressing   Problem: Coping: Goal: Level of anxiety will decrease Outcome: Not Progressing   Problem: Elimination: Goal: Will not experience complications related to bowel motility Outcome: Not Progressing Goal: Will not experience complications related to urinary retention Outcome: Not Progressing   Problem: Pain Managment: Goal: General experience of comfort will improve and/or be controlled Outcome: Not Progressing   Problem: Safety: Goal: Ability to remain free from injury will improve Outcome: Not Progressing   Problem: Skin Integrity: Goal: Risk for impaired skin integrity will decrease Outcome: Not Progressing   Problem: Education: Goal: Knowledge of General Education information will improve Description: Including pain rating scale, medication(s)/side effects and non-pharmacologic comfort measures Outcome: Not Progressing   Problem: Health Behavior/Discharge Planning: Goal: Ability to manage health-related needs will improve Outcome: Not Progressing   Problem: Clinical Measurements: Goal: Ability to maintain clinical measurements within normal limits will improve Outcome: Not Progressing Goal: Will remain free from infection Outcome: Not Progressing Goal: Diagnostic test results will improve Outcome: Not Progressing Goal: Respiratory complications will improve Outcome: Not Progressing Goal: Cardiovascular complication will be avoided Outcome: Not Progressing   Problem: Activity: Goal: Risk for activity intolerance will decrease Outcome: Not Progressing   Problem: Nutrition: Goal: Adequate nutrition will be maintained Outcome: Not Progressing   Problem:  Coping: Goal: Level of anxiety will decrease Outcome: Not Progressing   Problem: Elimination: Goal: Will not experience complications related to bowel motility Outcome: Not Progressing Goal: Will not experience complications related to urinary retention Outcome: Not Progressing   Problem: Pain Managment: Goal: General experience of comfort will improve and/or be controlled Outcome: Not Progressing   Problem: Safety: Goal: Ability to remain free from injury will improve Outcome: Not Progressing   Problem: Skin Integrity: Goal: Risk for impaired skin integrity  will decrease Outcome: Not Progressing

## 2024-03-07 ENCOUNTER — Ambulatory Visit: Admitting: Family Medicine

## 2024-03-07 DIAGNOSIS — J441 Chronic obstructive pulmonary disease with (acute) exacerbation: Secondary | ICD-10-CM | POA: Diagnosis not present

## 2024-03-07 DIAGNOSIS — J690 Pneumonitis due to inhalation of food and vomit: Secondary | ICD-10-CM

## 2024-03-07 DIAGNOSIS — J0410 Acute tracheitis without obstruction: Secondary | ICD-10-CM | POA: Diagnosis not present

## 2024-03-07 LAB — CBC
HCT: 34.8 % — ABNORMAL LOW (ref 36.0–46.0)
Hemoglobin: 10.9 g/dL — ABNORMAL LOW (ref 12.0–15.0)
MCH: 29.1 pg (ref 26.0–34.0)
MCHC: 31.3 g/dL (ref 30.0–36.0)
MCV: 92.8 fL (ref 80.0–100.0)
Platelets: 409 K/uL — ABNORMAL HIGH (ref 150–400)
RBC: 3.75 MIL/uL — ABNORMAL LOW (ref 3.87–5.11)
RDW: 13.2 % (ref 11.5–15.5)
WBC: 11.2 K/uL — ABNORMAL HIGH (ref 4.0–10.5)
nRBC: 0 % (ref 0.0–0.2)

## 2024-03-07 LAB — BASIC METABOLIC PANEL WITH GFR
Anion gap: 8 (ref 5–15)
BUN: 18 mg/dL (ref 8–23)
CO2: 27 mmol/L (ref 22–32)
Calcium: 8.6 mg/dL — ABNORMAL LOW (ref 8.9–10.3)
Chloride: 103 mmol/L (ref 98–111)
Creatinine, Ser: 0.9 mg/dL (ref 0.44–1.00)
GFR, Estimated: >60 mL/min (ref >60)
Glucose, Bld: 153 mg/dL — ABNORMAL HIGH (ref 70–99)
Potassium: 4.2 mmol/L (ref 3.5–5.1)
Sodium: 138 mmol/L (ref 135–145)

## 2024-03-07 LAB — GLUCOSE, CAPILLARY
Glucose-Capillary: 124 mg/dL — ABNORMAL HIGH (ref 70–99)
Glucose-Capillary: 140 mg/dL — ABNORMAL HIGH (ref 70–99)
Glucose-Capillary: 278 mg/dL — ABNORMAL HIGH (ref 70–99)
Glucose-Capillary: 211 mg/dL — ABNORMAL HIGH (ref 70–99)

## 2024-03-07 MED ORDER — IPRATROPIUM-ALBUTEROL 0.5-2.5 (3) MG/3ML IN SOLN: 3.0000 mL | Freq: Two times a day (BID) | RESPIRATORY_TRACT | Status: DC

## 2024-03-07 MED ORDER — IPRATROPIUM-ALBUTEROL 0.5-2.5 (3) MG/3ML IN SOLN: 3.0000 mL | Freq: Four times a day (QID) | RESPIRATORY_TRACT | Status: DC | PRN

## 2024-03-07 MED ORDER — AMLODIPINE BESYLATE 5 MG PO TABS
5.0000 mg | ORAL_TABLET | Freq: Every day | ORAL | Status: DC
  Administered 2024-03-07: 5 mg via ORAL
  Filled 2024-03-07 (×2): qty 1

## 2024-03-07 NOTE — Assessment & Plan Note (Addendum)
 Echocardiogram on 9/21 with LVEF 70-75%, no regional wall motion abnormalities, similar to prior. Aortic valve regurgitation is mild to moderate. A small pericardial effusion is present.

## 2024-03-07 NOTE — Assessment & Plan Note (Addendum)
 Admitted on 10 L and now on 6 L supplemental O2 via tracheostomy. No home O2 requirement. WBC 8.4> 11.2. -Pulmonology signed off, they agree with current management plan -Continue broad spectrum antibiotics:  -IV Vancomycin  per pharmacy (9/21-), MRSA swab positive  -IV Cefepime  per pharmacy (9/21-), can likely de-escalate to Rocephin if continues to improve -Continue supplemental oxygen  via trach, wean as tolerated. Goal 88-92% given hx COPD -Duoneb scheduled every 12 hours; Q6H PRN -Status post 1 dose of methylprednisolone  125 mg, continue prednisone  40 mg daily course (9/20-9/24) -Continue home nebs daily -AM CBC

## 2024-03-07 NOTE — Assessment & Plan Note (Signed)
 Inflammatory vs neoplastic. Hx breast cancer and laryngeal cancer. Former smoker, 40 pack years. -Follow up CT recommended in 3 months

## 2024-03-07 NOTE — Progress Notes (Signed)
 Daily Progress Note Intern Pager: [phone]  Patient name: Deborah [name] Medical record number: [id number] Date of birth: 10/07/1955 Age: 68 y.o. Gender: female  Primary Care Provider: Cleotilde Lukes, DO Consultants: Pulmonology (signed off) Code Status: FULL  Pt Overview and Major Events to Date:  9/20 - Admitted  Deborah [name] is a 68 year old female presenting with shortness of breath, cough, hemoptysis and hypoxia at home. 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  Assessment & Plan Acute hypoxemic respiratory failure (HCC) Aspiration pneumonitis (HCC) Tracheitis COPD (chronic obstructive pulmonary disease) (HCC) Admitted on 10 L and now on 6 L supplemental O2 via tracheostomy. No home O2 requirement. WBC 8.4> 11.2. -Pulmonology signed off, they agree with current management plan -Continue broad spectrum antibiotics:  -IV Vancomycin  per pharmacy (9/21-), MRSA swab positive  -IV Cefepime  per pharmacy (9/21-), can likely de-escalate to Rocephin if continues to improve -Continue supplemental oxygen  via trach, wean as tolerated. Goal 88-92% given hx COPD -Duoneb scheduled every 12 hours; Q6H PRN -Status post 1 dose of methylprednisolone  125 mg, continue prednisone  40 mg daily course (9/20-9/24) -Continue home nebs daily -AM CBC Hemoptysis Opacity of lung on imaging study Inflammatory vs neoplastic. Hx breast cancer and laryngeal cancer. Former smoker, 40 pack years. -Follow up CT recommended in 3 months Heart murmur Pericardial effusion Echocardiogram on 9/21 with LVEF 70-75%, no regional wall motion abnormalities,  similar to prior. Aortic valve regurgitation is mild to moderate. A small pericardial effusion is present.  Chronic health problem T2DM - resistant SSI, CBG checks ACHS, home Lantus  35 units held on admission, consider restarting this. Most recent A1c 6.5. HTN - BP elevated with systolic in Continue home losartan  50 mg daily, Coreg  12.5 mg twice daily; Added amlodipine  5 mg HLD -Home Crestor  40 mg daily Anxiety -Home Lexapro  20 mg, bupropion  150 mg daily Hypothyroidism -Home levothyroxine  225 mcg daily. Most recent TSH 9.4 on 9/13, her dose was adjusted during her recent hospital stay and will be due for recheck in about 1 month   FEN/GI: Regular diet PPx: Lovenox  Dispo:Home pending clinical improvement .  Subjective:  Patient seen at bedside. Breathing improved this morning on 6 L O2. Speaking comfortably.  Objective: Temp:  [98 F (36.7 C)-98.5 F (36.9 C)] 98 F (36.7 C) (09/22 0940) Pulse Rate:  [69-86] 73 (09/22 0940) Resp:  [10-24] 17 (09/22 0940) BP: (133-205)/(77-94) 180/84 (09/22 0940) SpO2:  [91 %-97 %] 95 % (09/22 0940) FiO2 (%):  [28 %-30 %] 28 % (09/22 0736)  Physical Exam: General: Lying comfortably in bed, NAD Cardiovascular: regular rate and rhythm Respiratory: Normal respiratory effort on 6 L O2 Abdomen: Soft, non-distended; normal bowel sounds Extremities: Warm and well-perfused; moves all extremities equally grossly  Laboratory: Most recent CBC Lab Results  Component Value Date   WBC 11.2 (H) 03/07/2024   HGB 10.9 (L) 03/07/2024   HCT 34.8 (L) 03/07/2024   MCV 92.8 03/07/2024   PLT 409 (H) 03/07/2024   Most recent BMP    Latest Ref Rng & Units 03/07/2024    3:42 AM  BMP  Glucose 70 - 99 mg/dL 846   BUN 8 - 23 mg/dL 18   Creatinine 9.55 - 1.00 mg/dL 9.09  Sodium 135 - 145 mmol/L 138   Potassium 3.5 - 5.1 mmol/L 4.2   Chloride 98 - 111 mmol/L 103   CO2 22 - 32 mmol/L 27   Calcium  8.9 - 10.3 mg/dL 8.6     Other pertinent labs: None    Imaging/Diagnostic Tests: No new imaging.  Mannie Ashley SAILOR, MD 03/07/2024, 10:46 AM  PGY-1, [facility] Family Medicine FPTS Intern pager: [phone], text pages welcome Secure chat group [facility] Teaching Service

## 2024-03-07 NOTE — Assessment & Plan Note (Addendum)
 T2DM - resistant SSI, CBG checks ACHS, home Lantus  35 units held on admission, consider restarting this. Most recent A1c 6.5. HTN - BP elevated with systolic in Continue home losartan  50 mg daily, Coreg  12.5 mg twice daily; Added amlodipine  5 mg HLD -Home Crestor  40 mg daily Anxiety -Home Lexapro  20 mg, bupropion  150 mg daily Hypothyroidism -Home levothyroxine  225 mcg daily. Most recent TSH 9.4 on 9/13, her dose was adjusted during her recent hospital stay and will be due for recheck in about 1 month

## 2024-03-08 ENCOUNTER — Other Ambulatory Visit (HOSPITAL_COMMUNITY): Payer: Self-pay

## 2024-03-08 DIAGNOSIS — J441 Chronic obstructive pulmonary disease with (acute) exacerbation: Secondary | ICD-10-CM | POA: Diagnosis not present

## 2024-03-08 DIAGNOSIS — J0410 Acute tracheitis without obstruction: Secondary | ICD-10-CM | POA: Diagnosis not present

## 2024-03-08 DIAGNOSIS — Z930 Tracheostomy status: Secondary | ICD-10-CM | POA: Diagnosis not present

## 2024-03-08 DIAGNOSIS — J690 Pneumonitis due to inhalation of food and vomit: Secondary | ICD-10-CM | POA: Diagnosis not present

## 2024-03-08 LAB — GLUCOSE, CAPILLARY
Glucose-Capillary: 150 mg/dL — ABNORMAL HIGH (ref 70–99)
Glucose-Capillary: 132 mg/dL — ABNORMAL HIGH (ref 70–99)

## 2024-03-08 LAB — CBC
HCT: 36.9 % (ref 36.0–46.0)
Hemoglobin: 11.7 g/dL — ABNORMAL LOW (ref 12.0–15.0)
MCH: 29.5 pg (ref 26.0–34.0)
MCHC: 31.7 g/dL (ref 30.0–36.0)
MCV: 92.9 fL (ref 80.0–100.0)
Platelets: 433 K/uL — ABNORMAL HIGH (ref 150–400)
RBC: 3.97 MIL/uL (ref 3.87–5.11)
RDW: 13.5 % (ref 11.5–15.5)
WBC: 10.9 K/uL — ABNORMAL HIGH (ref 4.0–10.5)
nRBC: 0 % (ref 0.0–0.2)

## 2024-03-08 MED ORDER — PREDNISONE 20 MG PO TABS
40.0000 mg | ORAL_TABLET | Freq: Every day | ORAL | 0 refills | Status: AC
  Filled 2024-03-08: qty 4, 2d supply, fill #0

## 2024-03-08 MED ORDER — AMLODIPINE BESYLATE 10 MG PO TABS
10.0000 mg | ORAL_TABLET | Freq: Every day | ORAL | 0 refills | Status: DC
  Filled 2024-03-08: qty 30, 30d supply, fill #0

## 2024-03-08 MED ORDER — HYDROXYZINE HCL 25 MG PO TABS
25.0000 mg | ORAL_TABLET | Freq: Three times a day (TID) | ORAL | 0 refills | Status: DC | PRN
  Filled 2024-03-08: qty 30, 10d supply, fill #0

## 2024-03-08 MED ORDER — MUPIROCIN 2 % EX OINT
1.0000 | TOPICAL_OINTMENT | Freq: Two times a day (BID) | CUTANEOUS | 0 refills | Status: AC
  Filled 2024-03-08: qty 22, 11d supply, fill #0

## 2024-03-08 MED ORDER — DOXYCYCLINE HYCLATE 100 MG PO TABS
100.0000 mg | ORAL_TABLET | Freq: Two times a day (BID) | ORAL | 0 refills | Status: AC
Start: 2024-03-08 — End: 2024-03-13
  Filled 2024-03-08: qty 9, 5d supply, fill #0

## 2024-03-08 MED ORDER — AMOXICILLIN-POT CLAVULANATE 875-125 MG PO TABS
1.0000 | ORAL_TABLET | Freq: Two times a day (BID) | ORAL | 0 refills | Status: AC
  Filled 2024-03-08: qty 9, 5d supply, fill #0

## 2024-03-08 MED ORDER — ACETAMINOPHEN 325 MG PO TABS: 650.0000 mg | ORAL_TABLET | Freq: Four times a day (QID) | ORAL | Status: AC | PRN

## 2024-03-08 MED ORDER — PANTOPRAZOLE SODIUM 40 MG PO TBEC
40.0000 mg | DELAYED_RELEASE_TABLET | Freq: Every day | ORAL | 0 refills | Status: DC
  Filled 2024-03-08: qty 30, 30d supply, fill #0

## 2024-03-08 MED ORDER — AMLODIPINE BESYLATE 10 MG PO TABS
10.0000 mg | ORAL_TABLET | Freq: Every day | ORAL | Status: DC
  Administered 2024-03-08: 10 mg via ORAL
  Filled 2024-03-08: qty 1

## 2024-03-08 NOTE — Progress Notes (Signed)
 Discharge: Patient verbally understands discharge POC.  Oxygen  to be delivered to [facility].  Patient has chronic trach no respiratory distress noted.   PIV removed dressing intact.  TOC meds ready discharge lounge called.

## 2024-03-08 NOTE — Assessment & Plan Note (Addendum)
 Inflammatory vs neoplastic. Hx breast cancer and laryngeal cancer. Former smoker, 40 pack years. -Follow up CT recommended in 3 months

## 2024-03-08 NOTE — Progress Notes (Cosign Needed Addendum)
 Daily Progress Note Intern Pager: [phone]  Patient name: Deborah [name] Medical record number: [id number] Date of birth: 1956-02-22 Age: 68 y.o. Gender: female  Primary Care Provider: Cleotilde Lukes, DO Consultants: Pulmonology (signed off) Code Status: FULL  Pt Overview and Major Events to Date:  9/20 - Admitted  Reyonna Haack is a 68 year old female presenting with shortness of breath, cough, hemoptysis and hypoxia at home. 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  Assessment & Plan Acute hypoxemic respiratory failure (HCC) Aspiration pneumonitis (HCC) Tracheitis COPD (chronic obstructive pulmonary disease) (HCC) Stable. Admitted on 10 L and now on 6 L supplemental O2 via tracheostomy. No home O2 requirement.  -Broad spectrum antibiotics:  -IV Vancomycin  per pharmacy (9/20-9/22), MRSA swab positive  -IV Cefepime  per pharmacy (9/20-9/22)  -PO Augmentin  (9/23-9/28)  -PO Doxycycline  (9/23-9/28) -Continue supplemental oxygen  via trach, wean as tolerated. Goal 88-92% given hx COPD -Status post 1 dose of methylprednisolone  125 mg, continue prednisone  40 mg daily course (9/20-9/24) -Duoneb Q6H PRN -Continue home nebs daily Hemoptysis Opacity of lung on imaging study Inflammatory vs neoplastic. Hx breast cancer and laryngeal cancer. Former smoker, 40 pack years. -Follow up CT recommended in 3 months Heart murmur Pericardial effusion Echocardiogram on 9/21 with LVEF 70-75%, no regional wall motion abnormalities, similar to prior. Aortic valve regurgitation is mild to moderate. A small pericardial effusion is  present. Chronic health problem T2DM - resistant SSI, CBG checks ACHS, lantus  10 units; most recent A1c 6.5. HTN - BP elevated with systolic >160 in Continue home losartan  50 mg daily, Coreg  12.5 mg twice daily; Increased amlodipine  to 10 mg HLD -Home Crestor  40 mg daily Anxiety -Home Lexapro  20 mg, bupropion  150 mg daily Hypothyroidism -Home levothyroxine  225 mcg daily. Most recent TSH 9.4 on 9/13, her dose was adjusted during her recent hospital stay and will be due for recheck in about 1 month  FEN/GI: Regular diet PPx: Lovenox  Dispo:Home pending clinical improvement .  Subjective:  Patient seen at bedside. Reports she feels well and states she would like to go home today. Denies chest pain. Patient asks about her elevated blood pressure. Informed her that Amlodipine  was started yesterday and we will increase today.   Objective: Temp:  [97.6 F (36.4 C)-98.3 F (36.8 C)] 97.8 F (36.6 C) (09/22 2336) Pulse Rate:  [72-90] 72 (09/22 2336) Resp:  [12-20] 16 (09/22 2336) BP: (163-205)/(68-94) 163/71 (09/22 2336) SpO2:  [88 %-96 %] 91 % (09/22 2336) FiO2 (%):  [28 %] 28 % (09/23 0411)  Physical Exam: General: Lying comfortably in bed, NAD Cardiovascular: regular rate and rhythm Respiratory: Normal respiratory effort on 6 L O2 Abdomen: Soft, non-distended; normal bowel sounds Extremities: Warm and well-perfused; moves all extremities equally grossly  Laboratory: Most recent CBC Lab Results  Component Value Date   WBC 10.9 (H) 03/08/2024   HGB 11.7 (L) 03/08/2024   HCT 36.9 03/08/2024   MCV 92.9 03/08/2024   PLT 433 (H) 03/08/2024   Most recent BMP    Latest Ref Rng & Units 03/07/2024    3:42 AM  BMP  Glucose 70 - 99 mg/dL 846   BUN 8 - 23 mg/dL 18   Creatinine 9.55 - 1.00 mg/dL 9.09  Sodium 135 - 145 mmol/L 138   Potassium 3.5 - 5.1 mmol/L 4.2   Chloride 98 - 111 mmol/L 103   CO2 22 - 32 mmol/L 27   Calcium  8.9 - 10.3 mg/dL 8.6     Imaging/Diagnostic Tests: No  new imaging  Mannie Ashley SAILOR, MD 03/08/2024, 7:06 AM  PGY-1, [facility] Family Medicine FPTS Intern pager: [phone], text pages welcome Secure chat group [facility] Teaching Service    Addendum: Signed progress note for fellow intern (she was unable to do so prior to leaving for the day) Alan Flies, MD 03/08/24 5:52 PM

## 2024-03-08 NOTE — Telephone Encounter (Signed)
 I called and spoke with Arvella, he states that trach supplies were sent out on 01/04/24 and 02/04/24, however, there is a note stating that she needs an updated or der for trach supplies and nebulizer supplies.  Arvella will send me a list of specifically what trach supplies she needs and I will send an order for the supplies and will send new order for neb supplies.    Order sent to [employer] for trach and nebulizer supplies.  Since the patient is currently admitted to the hospital, I sent her a mychart message to let her know that I have sent orders to [employer] for her Jamal supplies and nebulizer supplies.  Advised to contact us  if she needs anything further.

## 2024-03-08 NOTE — Assessment & Plan Note (Addendum)
 Stable. Admitted on 10 L and now on 6 L supplemental O2 via tracheostomy. No home O2 requirement.  -Broad spectrum antibiotics:  -IV Vancomycin  per pharmacy (9/20-9/22), MRSA swab positive  -IV Cefepime  per pharmacy (9/20-9/22)  -PO Augmentin  (9/23-9/28)  -PO Doxycycline  (9/23-9/28) -Continue supplemental oxygen  via trach, wean as tolerated. Goal 88-92% given hx COPD -Status post 1 dose of methylprednisolone  125 mg, continue prednisone  40 mg daily course (9/20-9/24) -Duoneb Q6H PRN -Continue home nebs daily

## 2024-03-08 NOTE — Assessment & Plan Note (Addendum)
 Echocardiogram on 9/21 with LVEF 70-75%, no regional wall motion abnormalities, similar to prior. Aortic valve regurgitation is mild to moderate. A small pericardial effusion is present.

## 2024-03-08 NOTE — Discharge Instructions (Signed)
 Dear Deborah [name],  Thank you for letting us  participate in your care. You were hospitalized for shortness of breath and diagnosed with Acute hypoxemic respiratory failure (HCC). You were treated with antibiotics and oxygen .   POST-HOSPITAL & CARE INSTRUCTIONS Patient to take all of your medications as listed in this packet. Go to your follow up appointments (listed below)  DOCTOR'S APPOINTMENT   Future Appointments  Date Time Provider Department Center  03/15/2024 10:30 AM Kara Dorn NOVAK, MD [facility] [address]  03/15/2024  2:20 PM Cleotilde Lukes, DO [facility]    Follow-up Information     Cleotilde Lukes, DO Follow up on 03/15/2024.   Specialty: Family Medicine Why: at 2:20 PM for hospital follow up Contact information: [address] [place] [address] [phone]                 Take care and be well!  Family Medicine Teaching Service Inpatient Team [place]  [facility]  [address] [place], [place] [address] [phone]

## 2024-03-08 NOTE — Progress Notes (Signed)
 Nurse requested Mobility Specialist to perform oxygen  saturation test with pt which includes removing pt from oxygen  both at rest and while ambulating.  Below are the results from that testing.     Patient Saturations on Room Air at Rest = spO2 94%  Patient Saturations on Room Air while Ambulating = sp02 87% .   Patient Saturations on 2 Liters of oxygen  while Ambulating = sp02 90%  At end of testing pt left in room on 0  Liters of oxygen .  Reported results to nurse.   Therisa Rana Mobility Specialist Please contact via SecureChat or  Rehab office at [phone]

## 2024-03-08 NOTE — TOC CM/SW Note (Signed)
 Per mobility tech documentation:   Nurse requested Mobility Specialist to perform oxygen  saturation test with pt which includes removing pt from oxygen  both at rest and while ambulating.  Below are the results from that testing.      Patient Saturations on Room Air at Rest = spO2 94%   Patient Saturations on Room Air while Ambulating = sp02 87% .    Patient Saturations on 2 Liters of oxygen  while Ambulating = sp02 90%   At end of testing pt left in room on 0  Liters of oxygen .   Reported results to nurse.    Therisa Rana Mobility Specialist Please contact via SecureChat or  Rehab office at [phone]

## 2024-03-08 NOTE — Progress Notes (Signed)
 Mobility Specialist Progress Note:    03/08/24 0940  Mobility  Activity Ambulated independently  Level of Assistance Independent  Assistive Device None  Distance Ambulated (ft) 960 ft  Activity Response Tolerated well  Mobility Referral Yes  Mobility visit 1 Mobility  Mobility Specialist Start Time (ACUTE ONLY) 0940  Mobility Specialist Stop Time (ACUTE ONLY) 0950  Mobility Specialist Time Calculation (min) (ACUTE ONLY) 10 min   Pt received in bed, eager for mobility session. Ambulated in hallway independently. Tolerated well, took several standing rest breaks to recover. SpO2 WFL. Returned pt to room, left with all needs met. Taking bath. RN notified. Eager for d/c.    Krishiv Sandler Mobility Specialist Please contact via SecureChat or  Rehab office at [phone]

## 2024-03-08 NOTE — Assessment & Plan Note (Addendum)
 T2DM - resistant SSI, CBG checks ACHS, lantus  10 units; most recent A1c 6.5. HTN - BP elevated with systolic >160 in Continue home losartan  50 mg daily, Coreg  12.5 mg twice daily; Increased amlodipine  to 10 mg HLD -Home Crestor  40 mg daily Anxiety -Home Lexapro  20 mg, bupropion  150 mg daily Hypothyroidism -Home levothyroxine  225 mcg daily. Most recent TSH 9.4 on 9/13, her dose was adjusted during her recent hospital stay and will be due for recheck in about 1 month

## 2024-03-08 NOTE — Discharge Summary (Signed)
 Family Medicine Teaching [facility] Discharge Summary  Patient name: Deborah [name] Medical record number: [id number] Date of birth: 1955-12-06 Age: 68 y.o. Gender: female Date of Admission: 03/05/2024  Date of Discharge: 03/08/2024 Admitting Physician: Payton Coward, MD  Primary Care Provider: Cleotilde Lukes, DO Consultants:   Indication for Hospitalization: acute hypoxemic respiratory failure secondary to aspiration pneumonitis/ tracheitis   Discharge Diagnoses/Problem List:  Principal Problem for Admission: acute hypoxemic respiratory failure secondary to aspiration pneumonitis/ tracheitis  Other Problems addressed during stay:    Tracheitis   COPD (chronic obstructive pulmonary disease) (HCC)   Heart murmur   Pericardial effusion   Aspiration pneumonitis (HCC)   Opacity of lung on imaging study  Brief Hospital Course:  Deborah [name] is a 68 y.o. female with a past medical history of CHF, HFpEF, COPD, CKD stage 3a, diabetes, breast CA, seizures, hypothyroidism, HTN, GERD, and hx of tracheostomy who was admitted to the [facility] Medicine Teaching Service at [facility] for acute hypoxic respiratory failure secondary to aspiration pneumonitis and tracheitis. Patient was recently admitted to the hospital from 02/22/24-02/28/24 for respiratory distress due to COVID pneumonia, requiring brief ICU stay. Hospital course is outlined below by problem.   Acute hypoxemic respiratory failure Aspiration pneumonitis Tracheitis Patient presented with worsening shortness of breath, pleuritic chest pain, non-productive cough, hemoptysis, and hypoxia (desats into 60s-80s). Patient noted to be in severe respiratory distress with O2 saturation in ED triage noted to be 38%. Cardiac workup was unremarkable. CXR with no acute process. CT PE notable for new fluid obstruction of the left upper lobe bronchus with patchy consolidation consistent with LUL aspiration pneumonitis, tracheitis, and RLL  opacity. Patient started on supplemental blow-by oxygen , steroids, DuoNebs, and Solu-Medrol  and home nebulizers. Started IV antibiotics Vancomycin  (9/21-9/23), Cefepime  (9/21-9/23), Flagyl  (9/21). Pulmonology was consulted and were in agreement with current management plan; patient scheduled with outpatient pulm follow-up on 9/30. At time of discharge, patient was transitioned to oral Augmentin  (9/23-9/28) and Doxycycline  (9/23-9/28) to be completed outpatient. Patient to continue prednisone  40 mg daily course (9/20-9/24). Home O2 supplies were ordered given need for 2L on ambulation.  Hemoptysis / RLL Lung opacity on imaging No evidence of pulmonary embolism on CT. 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 Patient is also a 40-pack year former smoker.  Feel transient mopped assist due to localized airway inflammation.  Recommended repeat CT scan in 3 months for monitoring of nodule.  Heart murmur / pericardial effusion Systolic murmur appreciated on physical exam. Echocardiogram on 9/21 with LVEF 70-75% similar to prior, no regional wall motion abnormalities, mild to moderate aortic valve regurgitation, and small pericardial effusion. Patient remained hemodynamically stable over admission.  Hypertension Patient with history of hypertension, elevated BP's during admission with systolic high of upper 100s-low 200s. Patient was continued on home Losartan  50 mg daily and Coreg  12.5 mg twice daily. Added amlodipine  10 mg to regimen by discharge.  Other conditions that were chronic and stable: Type 2 diabetes, HLD, Anxiety, Hypothyroidism, TSH  Issues for follow up: New 14 x 9 mm pleural-based right lower lobe opacity. Possible inflammatory nodule but 18-month follow-up CT recommended. Increased mediastinal and hilar adenopathy, most likely reactive, alternatively neoplastic. Follow up on improvement in breathing and completion of  antibiotics Evaluate blood pressure control  Disposition: Home  Discharge Condition: Stable  Discharge Exam:  Vitals:   03/08/24 0748 03/08/24 1252  BP: (!) 205/92   Pulse: (!) 155 79  Resp: 15  19  Temp: 98.6 F (37 C)   SpO2: 91% 94%   Physical Exam: General: Lying comfortably in bed, NAD Cardiovascular: regular rate and rhythm Respiratory: Normal respiratory effort on 6 L O2 Abdomen: Soft, non-distended; normal bowel sounds Extremities: Warm and well-perfused; moves all extremities equally grossl  Significant Procedures: None  Significant Labs and Imaging:  Recent Labs  Lab 03/07/24 0342 03/08/24 0301  WBC 11.2* 10.9*  HGB 10.9* 11.7*  HCT 34.8* 36.9  PLT 409* 433*   Recent Labs  Lab 03/07/24 0342  NA 138  K 4.2  CL 103  CO2 27  GLUCOSE 153*  BUN 18  CREATININE 0.90  CALCIUM  8.6*    CXR (03/05/24): IMPRESSION: 1. No acute cardiopulmonary disease. 2. Chronic left lower lobe collapse.   Discharge Medications:  Allergies as of 03/08/2024       Reactions   Lisinopril  Cough        Medication List     TAKE these medications    acetaminophen  325 MG tablet Commonly known as: TYLENOL  Take 2 tablets (650 mg total) by mouth every 6 (six) hours as needed for mild pain (pain score 1-3) or fever (or Fever >/= 101).   amLODipine  10 MG tablet Commonly known as: NORVASC  Take 1 tablet (10 mg total) by mouth daily.   amoxicillin -clavulanate 875-125 MG tablet Commonly known as: AUGMENTIN  Take 1 tablet by mouth 2 (two) times daily for 9 doses.   amphetamine-dextroamphetamine 20 MG tablet Commonly known as: ADDERALL Take 20 mg by mouth 2 (two) times daily.   budesonide  0.5 MG/2ML nebulizer solution Commonly known as: PULMICORT  Take 2 mLs (0.5 mg total) by nebulization daily.   buPROPion  150 MG 24 hr tablet Commonly known as: WELLBUTRIN  XL TAKE 1 TABLET BY MOUTH DAILY   carvedilol  12.5 MG tablet Commonly known as: COREG  Take 1 tablet (12.5 mg  total) by mouth 2 (two) times daily with a meal.   doxycycline  100 MG tablet Commonly known as: VIBRA -TABS Take 1 tablet (100 mg total) by mouth 2 (two) times daily for 9 doses.   EQ Allergy Relief (Cetirizine ) 10 MG tablet Generic drug: cetirizine  Take 1 tablet by mouth once daily   escitalopram  20 MG tablet Commonly known as: LEXAPRO  Take 1 tablet (20 mg total) by mouth daily. What changed: when to take this   fluticasone  50 MCG/ACT nasal spray Commonly known as: FLONASE  Place 1 spray into both nostrils 2 (two) times daily.   formoterol  20 MCG/2ML nebulizer solution Commonly known as: PERFOROMIST  Take 2 mLs (20 mcg total) by nebulization 2 (two) times daily.   hydrOXYzine  25 MG tablet Commonly known as: ATARAX  Take 1 tablet (25 mg total) by mouth 3 (three) times daily as needed for anxiety.   insulin  lispro 100 UNIT/ML KwikPen Commonly known as: HumaLOG  KwikPen Inject 10 Units into the skin 3 (three) times daily. What changed: when to take this   Lantus  SoloStar 100 UNIT/ML Solostar Pen Generic drug: insulin  glargine INJECT SUBCUTANEOUSLY 35 UNITS  DAILY   levothyroxine  75 MCG tablet Commonly known as: SYNTHROID  Take 3 tablets (225 mcg total) by mouth daily at 6 (six) AM. What changed: Another medication with the same name was removed. Continue taking this medication, and follow the directions you see here.   losartan  50 MG tablet Commonly known as: COZAAR  Take 1 tablet (50 mg total) by mouth daily.   metFORMIN  500 MG 24 hr tablet Commonly known as: GLUCOPHAGE -XR TAKE 1 TABLET BY MOUTH DAILY  WITH BREAKFAST  montelukast  10 MG tablet Commonly known as: SINGULAIR  TAKE 1 TABLET BY MOUTH AT BEDTIME   mupirocin  ointment 2 % Commonly known as: BACTROBAN  Place 1 Application into the nose 2 (two) times daily for 2 days.   nystatin  cream Commonly known as: MYCOSTATIN  Apply 1 Application topically daily as needed (rash under breast stomach).   ondansetron  4 MG  tablet Commonly known as: Zofran  Take 1 tablet (4 mg total) by mouth every 8 (eight) hours as needed for nausea or vomiting.   pantoprazole  40 MG tablet Commonly known as: PROTONIX  Take 1 tablet (40 mg total) by mouth daily. Start taking on: March 09, 2024   predniSONE  20 MG tablet Commonly known as: DELTASONE  Take 2 tablets (40 mg total) by mouth daily with breakfast for 2 doses. Start taking on: March 09, 2024   revefenacin  175 MCG/3ML nebulizer solution Commonly known as: YUPELRI  Take 3 mLs (175 mcg total) by nebulization daily. What changed:  when to take this reasons to take this   rosuvastatin  40 MG tablet Commonly known as: CRESTOR  TAKE 1 TABLET BY MOUTH DAILY   Semaglutide (0.25 or 0.5MG /DOS) 2 MG/3ML Sopn Inject 0.5 mg into the skin once a week. What changed: additional instructions               Durable Medical Equipment  (From admission, onward)           Start     Ordered   03/08/24 1350  For home use only DME Other see comment  Once       Comments: Home Suction Reason: for stoma  Question:  Length of Need  Answer:  Lifetime   03/08/24 1350   03/08/24 1342  For home use only DME oxygen   Once       Question Answer Comment  Length of Need Lifetime   Mode or (Route) Nasal cannula   Liters per Minute 2   Frequency Continuous (stationary and portable oxygen  unit needed)   Oxygen  delivery system Gas      03/08/24 1341           Discharge Instructions: Please refer to Patient Instructions section of EMR for full details.  Patient was counseled important signs and symptoms that should prompt return to medical care, changes in medications, dietary instructions, activity restrictions, and follow up appointments.   Follow-Up Appointments: None  Follow-up Information     Cleotilde Lukes, DO Follow up on 03/15/2024.   Specialty: Family Medicine Why: at 2:20 PM for hospital follow up Contact information: [address] [place]  [address] [phone]         [facility]  Follow up.   Why: ([employer]) home 02 and suction arranged- portable 02 will be delived to pt prior to discharge (home concentrator and suction will deliver to home) Contact information: [address] [facility] [place] [address] [phone]                Mannie Crome, MD 03/08/2024, 3:11 PM PGY-1, [facility] Family Medicine   I agree with the assessment and plan as documented above.  Stuart Redo, MD PGY-3, [facility] Family Medicine

## 2024-03-08 NOTE — Progress Notes (Signed)
 Nurse requested Mobility Specialist to perform oxygen  saturation test with pt which includes removing pt from oxygen  both at rest and while ambulating.  Below are the results from that testing.     Patient Saturations on Room Air at Rest = spO2 93%  Patient Saturations on Room Air while Ambulating = sp02 87% .  Rested and performed pursed lip breathing for 1 minute with sp02 at 91%.  Patient Saturations on 0 Liters of oxygen  while Ambulating = sp02 90%  At end of testing pt left in room on 0  Liters of oxygen .  Reported results to nurse.    Deborah [name] Mobility Specialist Please contact via Special educational needs teacher or  Rehab office at [phone]

## 2024-03-08 NOTE — TOC Transition Note (Signed)
 Transition of Care (TOC) - Discharge Note Rayfield Gobble RN, BSN Inpatient Care Management Unit 4E- RN Case Manager See Treatment Team for direct phone #   Patient Details  Name: Deborah [name] MRN: [id number] Date of Birth: Jan 15, 1956  Transition of Care [facility]) CM/SW Contact:  Gobble Rayfield Hurst, RN Phone Number: 03/08/2024, 5:15 PM   Clinical Narrative:    Pt stable for transition home today, order placed for home 02 needs.   CM in to speak with pt at bedside- per pt she has [employer] for her trach/stoma supplies and would like to use them as well for her 02 at home. Pt is also asking for home suction. Will ask MD to place order for suction as well.   Family will transport home once portable 02 delivered by [employer] to bedside.   Call made to [employer] liaison for home 02 needs. [employer] to process for delivery and bring portable 02 for pt to transport home with prior to discharge- they will deliver suction to the home as well as additional supplies.   Noted pt was set up with [facility] for HHRN/PT/OT on last discharge. Call made to [facility] liaison to confirm pt is still active. Per liaison they can still service and will need order to resume.    No further INPT CM needs noted.    Final next level of care: Home w Home Health Services Barriers to Discharge: Barriers Resolved   Patient Goals and CMS Choice Patient states their goals for this hospitalization and ongoing recovery are:: return home   Choice offered to / list presented to : Patient      Discharge Placement               Home w/ [facility]        Discharge Plan and Services Additional resources added to the After Visit Summary for     Discharge Planning Services: CM Consult Post Acute Care Choice: Durable Medical Equipment, Home Health, Resumption of Svcs/PTA Provider          DME Arranged: Suction, Oxygen  DME Agency: [employer] Date DME Agency Contacted: 03/08/24 Time DME Agency Contacted: 1345 Representative  spoke with at DME Agency: Mitch HH Arranged: RN, PT, OT [facility] Agency: [facility] Date [facility] Agency Contacted: 03/09/24 Time HH Agency Contacted: 1611 Representative spoke with at [facility] Agency: [place]  Social Drivers of Health (SDOH) Interventions SDOH Screenings   Food Insecurity: No Food Insecurity (03/08/2024)  Housing: Low Risk  (03/08/2024)  Transportation Needs: No Transportation Needs (03/08/2024)  Utilities: Not At Risk (03/08/2024)  Depression (PHQ2-9): Low Risk  (11/24/2022)  Social Connections: Socially Integrated (03/08/2024)  Tobacco Use: Medium Risk (03/05/2024)     Readmission Risk Interventions     No data to display

## 2024-03-09 ENCOUNTER — Telehealth: Payer: Self-pay

## 2024-03-09 DIAGNOSIS — M1712 Unilateral primary osteoarthritis, left knee: Secondary | ICD-10-CM | POA: Diagnosis not present

## 2024-03-09 DIAGNOSIS — Z930 Tracheostomy status: Secondary | ICD-10-CM | POA: Diagnosis not present

## 2024-03-09 DIAGNOSIS — C329 Malignant neoplasm of larynx, unspecified: Secondary | ICD-10-CM | POA: Diagnosis not present

## 2024-03-09 DIAGNOSIS — Z96652 Presence of left artificial knee joint: Secondary | ICD-10-CM | POA: Diagnosis not present

## 2024-03-09 DIAGNOSIS — C321 Malignant neoplasm of supraglottis: Secondary | ICD-10-CM | POA: Diagnosis not present

## 2024-03-09 NOTE — Transitions of Care (Post Inpatient/ED Visit) (Signed)
   03/09/2024  Name: Catricia Scheerer MRN: [id number] DOB: [birth year]  Today's TOC FU Call Status: Today's TOC FU Call Status:: Unsuccessful Call (1st Attempt) Unsuccessful Call (1st Attempt) Date: 03/09/24  Attempted to reach the patient regarding the most recent Inpatient/ED visit.  Follow Up Plan: Additional outreach attempts will be made to reach the patient to complete the Transitions of Care (Post Inpatient/ED visit) call.   Shona Prow RN, CCM [place]  VBCI-Population Health RN Care Manager [phone]

## 2024-03-10 ENCOUNTER — Telehealth: Payer: Self-pay

## 2024-03-10 NOTE — Transitions of Care (Post Inpatient/ED Visit) (Signed)
   03/10/2024  Name: Deborah [name] MRN: [id number] DOB: December 22, 1955  Today's TOC FU Call Status: Today's TOC FU Call Status:: Successful TOC FU Call Completed TOC FU Call Complete Date: 03/10/24 (patient answered - verified - TOC RN explained program and patient states she has everything she needs and declined call/TOC program)  Shona Prow RN, CCM [place]  VBCI-Population Health RN Care Manager [phone]

## 2024-03-11 ENCOUNTER — Telehealth: Payer: Self-pay

## 2024-03-11 LAB — CULTURE, BLOOD (ROUTINE X 2)
Culture: NO GROWTH
Culture: NO GROWTH
Special Requests: ADEQUATE

## 2024-03-11 NOTE — Telephone Encounter (Signed)
 Tanisha from [facility] calling for RN verbal orders as follows:  1 time(s) weekly for 5 week(s).  Verbal orders given per [facility] protocol  Chiquita JAYSON English, RN

## 2024-03-13 ENCOUNTER — Other Ambulatory Visit: Payer: Self-pay | Admitting: Family Medicine

## 2024-03-13 DIAGNOSIS — I152 Hypertension secondary to endocrine disorders: Secondary | ICD-10-CM

## 2024-03-14 ENCOUNTER — Telehealth: Payer: Self-pay

## 2024-03-14 NOTE — Telephone Encounter (Signed)
 Received call from Leita, PT at [facility] home health.   She had visit with patient on Friday, 03/11/24.  She reports that per her evaluation, no treatment is needed.   Chiquita JAYSON English, RN

## 2024-03-15 ENCOUNTER — Encounter: Payer: Self-pay | Admitting: Pulmonary Disease

## 2024-03-15 ENCOUNTER — Ambulatory Visit: Payer: Self-pay | Admitting: Family Medicine

## 2024-03-15 ENCOUNTER — Ambulatory Visit (INDEPENDENT_AMBULATORY_CARE_PROVIDER_SITE_OTHER): Admitting: Pulmonary Disease

## 2024-03-15 VITALS — BP 144/89 | HR 87 | Ht 67.0 in | Wt 238.0 lb

## 2024-03-15 VITALS — BP 127/93 | HR 97

## 2024-03-15 DIAGNOSIS — Z23 Encounter for immunization: Secondary | ICD-10-CM

## 2024-03-15 DIAGNOSIS — E109 Type 1 diabetes mellitus without complications: Secondary | ICD-10-CM | POA: Diagnosis not present

## 2024-03-15 DIAGNOSIS — J4540 Moderate persistent asthma, uncomplicated: Secondary | ICD-10-CM | POA: Diagnosis not present

## 2024-03-15 DIAGNOSIS — J441 Chronic obstructive pulmonary disease with (acute) exacerbation: Secondary | ICD-10-CM | POA: Diagnosis not present

## 2024-03-15 DIAGNOSIS — Z9002 Acquired absence of larynx: Secondary | ICD-10-CM

## 2024-03-15 DIAGNOSIS — R911 Solitary pulmonary nodule: Secondary | ICD-10-CM

## 2024-03-15 DIAGNOSIS — J432 Centrilobular emphysema: Secondary | ICD-10-CM | POA: Diagnosis not present

## 2024-03-15 LAB — POCT GLYCOSYLATED HEMOGLOBIN (HGB A1C): HbA1c, POC (controlled diabetic range): 7.3 % — AB (ref 0.0–7.0)

## 2024-03-15 MED ORDER — PREDNISONE 50 MG PO TABS: 50.0000 mg | ORAL_TABLET | Freq: Every day | ORAL | 0 refills | Status: DC

## 2024-03-15 NOTE — Patient Instructions (Addendum)
 It was wonderful to see you today!  I am glad you are starting to recover from your recent illness. Because you are still having some difficulty breathing and your lungs are still recovering, I have placed an order for 5 more days of prednisone . While you are taking it, keep an eye on your blood sugars. If you see a number >250 at meal times, you can give an extra 2 units of your short acting insulin  to improve control.   I would like you to follow up with me in 2 weeks to reassess, or sooner if you feel worse.    Please call [phone] with any questions about today's appointment.   If you need any additional refills, please call your pharmacy before calling the office.  Lucie Pinal, DO Family Medicine

## 2024-03-15 NOTE — Progress Notes (Unsigned)
 Synopsis: Referred in December 2022 for Foreign Body in Bronchus by Dr. Jesus  Subjective:   PATIENT ID: Deborah [name] GENDER: female DOB: [birth year], MRN: [id number]  HPI  Deborah [name] is a 68 year old woman, former smoker with laryngeal cancer s/p laryngectomy who returns to pulmonary clinic for asthma/COPD.   She had multiple hospitalizations recently due to covid 19 pneumonia with hypoxemic respiratory failure and later with pneumonitis/tracheitis.  She has been doing well on budesonide  nebs twice daily, formoterol  nebs twice daily and yupelri  neb daily.   She has home oxygen  setup with trach collar mask. She is requesting suction yankaur equipment.   Past Medical History:  Diagnosis Date   Anemia    h/o of   Anxiety    Arthritis    in knees   Asthma    Breast cancer (HCC)    Bronchitis    Carpal tunnel syndrome 03/16/2018   Chronic pain 01/20/2012   Patient with chronic pain at stoma site. Seen by ENT (Dr. Jesus) who recommends pain management. 12/22/11. She is currently on Vicodin and fentanyl .   04/07/13: denies stoma pain. Currently on flexeril , mobic  and tramadol  for b/l shoulder and neck pains .   COPD (chronic obstructive pulmonary disease) (HCC)    Depression    Diabetes mellitus without complication (HCC)    Diverticulosis 11/15/2012   noted on screening colonoscopy    DOE (dyspnea on exertion) 04/28/2014   Esophageal stricture    FB bronchus    Former smoker 03/19/2011   GERD (gastroesophageal reflux disease)    Heart murmur    asymptomatic    History of laryngectomy    History of radiation therapy 11/15/18- 12/06/18   Right Breast total dose 42.56 Gy in 16 fractions.    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   Leukocytopenia    Nausea alone 07/28/2013   Neck pain 01/21/2012   Normal MRI 07/14/2011   negative for mestasis    Plantar fasciitis 06/06/2016   Pneumonia 2012   RUQ abdominal pain 08/08/2022   On exam patient with marked tenderness to percussion and palpation RUQ. Obesity hinder palpation  PLan   Sciatica    Seizures (HCC) 06/29/2011   07/24/11 off Effexor w/o seizure   Sepsis (HCC) 08/04/2012   Sinusitis, chronic 07/20/2011   Bilateral maxillary, identified on MRI of head 07/14/11.     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HCC)      Family History  Problem Relation Age of Onset   Heart disease Mother    Heart disease Father    Heart disease Sister    Cancer Sister        type unknown   Diabetes Sister    Cancer Brother        type unknown   Liver disease Maternal Grandmother    Breast cancer Neg Hx    Colon cancer Neg Hx    Esophageal cancer Neg Hx    Rectal cancer Neg Hx    Stomach cancer Neg Hx    Colon polyps Neg Hx      Social History   Socioeconomic History   Marital status: Married    Spouse name: Not on file  Number of children: 2   Years of education: 12   Highest education level: Not on file  Occupational History   Occupation: part time    Comment: warehouse work  Tobacco Use   Smoking status: Former    Current packs/day: 0.00    Average packs/day: 2.0 packs/day for 20.0 years (40.0 ttl pk-yrs)    Types: Cigarettes    Start date: 09/17/1990    Quit date: 09/17/2010    Years since quitting: 13.5   Smokeless tobacco: Never  Vaping Use   Vaping status: Never Used  Substance and Sexual Activity   Alcohol use: Yes    Comment: Socially drinks    Drug use: No    Comment: tried cocaine  1 time 2010 only used 1 time   Sexual activity: Yes    Birth control/protection: Surgical  Other Topics Concern   Not on file  Social History Narrative   Lives with boyfriend   Social Drivers of Health    Financial Resource Strain: Not on file  Food Insecurity: No Food Insecurity (03/08/2024)   Hunger Vital Sign    Worried About Running Out of Food in the Last Year: Never true    Ran Out of Food in the Last Year: Never true  Transportation Needs: No Transportation Needs (03/08/2024)   PRAPARE - Administrator, Civil Service (Medical): No    Lack of Transportation (Non-Medical): No  Physical Activity: Not on file  Stress: Not on file  Social Connections: Socially Integrated (03/08/2024)   Social Connection and Isolation Panel    Frequency of Communication with Friends and Family: More than three times a week    Frequency of Social Gatherings with Friends and Family: More than three times a week    Attends Religious Services: More than 4 times per year    Active Member of [employer] or Organizations: Yes    Attends Engineer, structural: More than 4 times per year    Marital Status: Married  Catering manager Violence: Not At Risk (03/08/2024)   Humiliation, Afraid, Rape, and Kick questionnaire    Fear of Current or Ex-Partner: No    Emotionally Abused: No    Physically Abused: No    Sexually Abused: No     Allergies  Allergen Reactions   Lisinopril  Cough     Outpatient Medications Prior to Visit  Medication Sig Dispense Refill   acetaminophen  (TYLENOL ) 325 MG tablet Take 2 tablets (650 mg total) by mouth every 6 (six) hours as needed for mild pain (pain score 1-3) or fever (or Fever >/= 101).     amLODipine  (NORVASC ) 10 MG tablet Take 1 tablet (10 mg total) by mouth daily. 30 tablet 0   amphetamine-dextroamphetamine (ADDERALL) 20 MG tablet Take 20 mg by mouth 2 (two) times daily.     budesonide  (PULMICORT ) 0.5 MG/2ML nebulizer solution Take 2 mLs (0.5 mg total) by nebulization daily. 120 mL 11   buPROPion  (WELLBUTRIN  XL) 150 MG 24 hr tablet TAKE 1 TABLET BY MOUTH DAILY 30 tablet 11   carvedilol  (COREG ) 12.5 MG tablet TAKE 1 TABLET BY MOUTH TWICE  DAILY WITH A MEAL  200 tablet 2   EQ ALLERGY RELIEF, CETIRIZINE , 10 MG tablet Take 1 tablet by mouth once daily 90 tablet 0   escitalopram  (LEXAPRO ) 20 MG tablet Take 1 tablet (20 mg total) by mouth daily. (Patient taking differently: Take 20 mg by mouth at bedtime.) 90 tablet 1   fluticasone  (FLONASE ) 50 MCG/ACT nasal  spray Place 1 spray into both nostrils 2 (two) times daily. 9.9 mL 0   formoterol  (PERFOROMIST ) 20 MCG/2ML nebulizer solution Take 2 mLs (20 mcg total) by nebulization 2 (two) times daily. 120 mL 6   hydrOXYzine  (ATARAX ) 25 MG tablet Take 1 tablet (25 mg total) by mouth 3 (three) times daily as needed for anxiety. 30 tablet 0   insulin  lispro (HUMALOG  KWIKPEN) 100 UNIT/ML KwikPen Inject 10 Units into the skin 3 (three) times daily. (Patient taking differently: Inject 10 Units into the skin 3 (three) times daily after meals.)     LANTUS  SOLOSTAR 100 UNIT/ML Solostar Pen INJECT SUBCUTANEOUSLY 35 UNITS  DAILY 45 mL 2   levothyroxine  (SYNTHROID ) 75 MCG tablet Take 3 tablets (225 mcg total) by mouth daily at 6 (six) AM. 90 tablet 0   losartan  (COZAAR ) 50 MG tablet Take 1 tablet (50 mg total) by mouth daily. 90 tablet 3   metFORMIN  (GLUCOPHAGE -XR) 500 MG 24 hr tablet TAKE 1 TABLET BY MOUTH DAILY  WITH BREAKFAST 30 tablet 11   montelukast  (SINGULAIR ) 10 MG tablet TAKE 1 TABLET BY MOUTH AT BEDTIME 90 tablet 0   mupirocin  ointment (BACTROBAN ) 2 % Place 1 Application into the nose 2 (two) times daily for 2 days. 22 g 0   nystatin  cream (MYCOSTATIN ) Apply 1 Application topically daily as needed (rash under breast stomach).     ondansetron  (ZOFRAN ) 4 MG tablet Take 1 tablet (4 mg total) by mouth every 8 (eight) hours as needed for nausea or vomiting. 20 tablet 0   pantoprazole  (PROTONIX ) 40 MG tablet Take 1 tablet (40 mg total) by mouth daily. 30 tablet 0   revefenacin  (YUPELRI ) 175 MCG/3ML nebulizer solution Take 3 mLs (175 mcg total) by nebulization daily. (Patient taking differently: Take 175 mcg by nebulization  daily as needed (Resp symptoms).) 270 mL 3   rosuvastatin  (CRESTOR ) 40 MG tablet TAKE 1 TABLET BY MOUTH DAILY 100 tablet 2   Semaglutide ,0.25 or 0.5MG /DOS, 2 MG/3ML SOPN Inject 0.5 mg into the skin once a week. (Patient taking differently: Inject 0.5 mg into the skin once a week. Inject subcutaneously on Wednesdays.)     No facility-administered medications prior to visit.   Review of Systems  Constitutional:  Negative for chills, fever, malaise/fatigue and weight loss.  HENT:  Negative for congestion, sinus pain and sore throat.   Eyes: Negative.   Respiratory:  Positive for shortness of breath. Negative for cough, hemoptysis, sputum production and wheezing.   Cardiovascular:  Negative for chest pain, palpitations, orthopnea, claudication and leg swelling.  Gastrointestinal:  Negative for abdominal pain, heartburn, nausea and vomiting.  Genitourinary: Negative.   Musculoskeletal:  Negative for joint pain and myalgias.  Skin:  Negative for rash.  Neurological:  Negative for weakness.  Endo/Heme/Allergies: Negative.   Psychiatric/Behavioral: Negative.      Objective:   Vitals:   03/15/24 1033  BP: (!) 144/89  Pulse: 87  SpO2: 92%  Weight: 238 lb (108 kg)  Height: 5' 7 (1.702 m)   Physical Exam Constitutional:      General: She is not in acute distress.    Appearance: She is not ill-appearing.  HENT:     Head: Normocephalic and atraumatic.     Mouth/Throat:     Comments: Laryngectomy stoma Cardiovascular:     Rate and Rhythm: Normal rate and regular rhythm.     Pulses: Normal pulses.     Heart sounds: Normal heart sounds. No murmur heard. Pulmonary:  Effort: Pulmonary effort is normal.     Breath sounds: No wheezing, rhonchi or rales.  Musculoskeletal:     Right lower leg: No edema.     Left lower leg: No edema.  Skin:    General: Skin is warm and dry.  Neurological:     General: No focal deficit present.     Mental Status: She is alert.     CBC     Component Value Date/Time   WBC 10.9 (H) 03/08/2024 0301   RBC 3.97 03/08/2024 0301   HGB 11.7 (L) 03/08/2024 0301   HGB 13.4 08/13/2023 1456   HGB 11.9 08/20/2015 1200   HCT 36.9 03/08/2024 0301   HCT 40.8 08/13/2023 1456   HCT 37.3 08/20/2015 1200   PLT 433 (H) 03/08/2024 0301   PLT 371 08/13/2023 1456   MCV 92.9 03/08/2024 0301   MCV 93 08/13/2023 1456   MCV 91.8 08/20/2015 1200   MCH 29.5 03/08/2024 0301   MCHC 31.7 03/08/2024 0301   RDW 13.5 03/08/2024 0301   RDW 12.4 08/13/2023 1456   RDW 14.4 08/20/2015 1200   LYMPHSABS 1.7 03/05/2024 1800   LYMPHSABS 1.2 08/20/2015 1200   MONOABS 0.6 03/05/2024 1800   MONOABS 0.3 08/20/2015 1200   EOSABS 0.2 03/05/2024 1800   EOSABS 0.2 08/20/2015 1200   BASOSABS 0.1 03/05/2024 1800   BASOSABS 0.1 08/20/2015 1200      Latest Ref Rng & Units 03/07/2024    3:42 AM 03/06/2024    4:19 AM 03/05/2024    6:00 PM  BMP  Glucose 70 - 99 mg/dL 846  734  866   BUN 8 - 23 mg/dL 18  16  16    Creatinine 0.44 - 1.00 mg/dL 9.09  8.92  9.07   Sodium 135 - 145 mmol/L 138  135  138   Potassium 3.5 - 5.1 mmol/L 4.2  4.7  4.6   Chloride 98 - 111 mmol/L 103  99  102   CO2 22 - 32 mmol/L 27  23  28    Calcium  8.9 - 10.3 mg/dL 8.6  8.3  8.5    Chest imaging: CTA 08/21/23 Mediastinum/Nodes: The patient is status post tracheostomy. The trachea is patent and midline. Normal appearance of the esophagus. No mediastinal or hilar adenopathy.   Lungs/Pleura: Complete atelectasis of the left lower lobe is again noted with asymmetric volume loss from the left lung. This has persisted and appears unchanged compared with 05/02/2021. Emphysema with diffuse bronchial wall thickening. No suspicious pulmonary nodule or mass identified.  PFT:     No data to display          Labs:  Path:  Echo 08/31/17: LVEF 65 to 70%.  Grade 2 diastolic dysfunction.  Normal size and function.  Heart Catheterization:  Assessment & Plan:   No diagnosis  found.  Discussion: Deborah [name] is a 68 year old woman, former smoker with laryngeal cancer s/p laryngectomy who returns to pulmonary clinic for asthma/COPD.  Asthma/COPD -Continue current long-acting nebulizer regimen: budesonide , yupelri  and brovana  -caution with steroid tapers in the future due to her diabetes  Laryngectomy - continue oxygen  via trach collar at night and as needed during day - check overnight oxygen  test - order for suction catheters placed  Post-Covid 19 infection - appears to be recovering well  Allergies - continue zyrtec  and montelukast  - continue fluticasone  nasal spray - ipratropium nasal spray PRN  Follow-up in 6 months.  Dorn Chill, MD [facility] Office: [phone]    Current Outpatient Medications:    acetaminophen  (TYLENOL ) 325 MG tablet, Take 2 tablets (650 mg total) by mouth every 6 (six) hours as needed for mild pain (pain score 1-3) or fever (or Fever >/= 101)., Disp: , Rfl:    amLODipine  (NORVASC ) 10 MG tablet, Take 1 tablet (10 mg total) by mouth daily., Disp: 30 tablet, Rfl: 0   amphetamine-dextroamphetamine (ADDERALL) 20 MG tablet, Take 20 mg by mouth 2 (two) times daily., Disp: , Rfl:    budesonide  (PULMICORT ) 0.5 MG/2ML nebulizer solution, Take 2 mLs (0.5 mg total) by nebulization daily., Disp: 120 mL, Rfl: 11   buPROPion  (WELLBUTRIN  XL) 150 MG 24 hr tablet, TAKE 1 TABLET BY MOUTH DAILY, Disp: 30 tablet, Rfl: 11   carvedilol  (COREG ) 12.5 MG tablet, TAKE 1 TABLET BY MOUTH TWICE  DAILY WITH A MEAL, Disp: 200 tablet, Rfl: 2   EQ ALLERGY RELIEF, CETIRIZINE , 10 MG tablet, Take 1 tablet by mouth once daily, Disp: 90 tablet, Rfl: 0   escitalopram  (LEXAPRO ) 20 MG tablet, Take 1 tablet (20 mg total) by mouth daily. (Patient taking differently: Take 20 mg by mouth at bedtime.), Disp: 90 tablet, Rfl: 1   fluticasone  (FLONASE ) 50 MCG/ACT nasal spray, Place 1 spray into both nostrils 2 (two) times daily., Disp: 9.9  mL, Rfl: 0   formoterol  (PERFOROMIST ) 20 MCG/2ML nebulizer solution, Take 2 mLs (20 mcg total) by nebulization 2 (two) times daily., Disp: 120 mL, Rfl: 6   hydrOXYzine  (ATARAX ) 25 MG tablet, Take 1 tablet (25 mg total) by mouth 3 (three) times daily as needed for anxiety., Disp: 30 tablet, Rfl: 0   insulin  lispro (HUMALOG  KWIKPEN) 100 UNIT/ML KwikPen, Inject 10 Units into the skin 3 (three) times daily. (Patient taking differently: Inject 10 Units into the skin 3 (three) times daily after meals.), Disp: , Rfl:    LANTUS  SOLOSTAR 100 UNIT/ML Solostar Pen, INJECT SUBCUTANEOUSLY 35 UNITS  DAILY, Disp: 45 mL, Rfl: 2   levothyroxine  (SYNTHROID ) 75 MCG tablet, Take 3 tablets (225 mcg total) by mouth daily at 6 (six) AM., Disp: 90 tablet, Rfl: 0   losartan  (COZAAR ) 50 MG tablet, Take 1 tablet (50 mg total) by mouth daily., Disp: 90 tablet, Rfl: 3   metFORMIN  (GLUCOPHAGE -XR) 500 MG 24 hr tablet, TAKE 1 TABLET BY MOUTH DAILY  WITH BREAKFAST, Disp: 30 tablet, Rfl: 11   montelukast  (SINGULAIR ) 10 MG tablet, TAKE 1 TABLET BY MOUTH AT BEDTIME, Disp: 90 tablet, Rfl: 0   mupirocin  ointment (BACTROBAN ) 2 %, Place 1 Application into the nose 2 (two) times daily for 2 days., Disp: 22 g, Rfl: 0   nystatin  cream (MYCOSTATIN ), Apply 1 Application topically daily as needed (rash under breast stomach)., Disp: , Rfl:    ondansetron  (ZOFRAN ) 4 MG tablet, Take 1 tablet (4 mg total) by mouth every 8 (eight) hours as needed for nausea or vomiting., Disp: 20 tablet, Rfl: 0   pantoprazole  (PROTONIX ) 40 MG tablet, Take 1 tablet (40 mg total) by mouth daily., Disp: 30 tablet, Rfl: 0   revefenacin  (YUPELRI ) 175 MCG/3ML nebulizer solution, Take 3 mLs (175 mcg total) by nebulization daily. (Patient taking differently: Take 175 mcg by nebulization daily as needed (Resp symptoms).), Disp: 270 mL, Rfl: 3   rosuvastatin  (CRESTOR ) 40 MG tablet, TAKE 1 TABLET BY MOUTH DAILY, Disp: 100 tablet, Rfl: 2   Semaglutide ,0.25 or 0.5MG /DOS, 2 MG/3ML  SOPN, Inject 0.5 mg into the skin once a week. (Patient taking differently: Inject 0.5  mg into the skin once a week. Inject subcutaneously on Wednesdays.), Disp: , Rfl:

## 2024-03-15 NOTE — Patient Instructions (Addendum)
 We will order you a Yankauer Style Suction catheter with refills to use for home suction  Continue budesonide  neb twice daily  Continue formoterol  nebulizer twice daily  Continue yupelri  nebulizer daily  We will check your oxygen  levels when walking today  We will check an overnight oxygen  test on room air  Follow up in 3 months to review CT Chest

## 2024-03-15 NOTE — Assessment & Plan Note (Signed)
-  routine A1c, urine acr and gfr monitoring today -advised use of extra SAI while on prednisone , 2 units for BG > 250 after using normal SAI dose.

## 2024-03-15 NOTE — Progress Notes (Signed)
    SUBJECTIVE:   CHIEF COMPLAINT / HPI:  Hospital follow up, routine monitoring of chronic conditions  Issues for follow up: New 14 x 9 mm pleural-based right lower lobe opacity. Possible inflammatory nodule but 28-month follow-up CT recommended. Increased mediastinal and hilar adenopathy, most likely reactive, alternatively neoplastic. Follow up on improvement in breathing and completion of antibiotics Evaluate blood pressure control  Overall feeling better, having an issue with the refill function of her home oxygen  system, plans to reach out to the DME company for assistance.  Reports that she does still have lingering cough and a sensation of congestion in central chest. Denies any ongoing pain or discomfort.   Due for routine monitoring of chronic conditions.  PERTINENT  PMH / PSH: COPD/Asthma, tracheostomy, T2DM  OBJECTIVE:   BP (!) 127/93   Pulse 97   SpO2 95% Comment: with oxygen   General: A&O, NAD Cardiac: RRR, no m/r/g Respiratory: Diffuse crackles in the R lung, with minimally decreased lung sounds at the base on the same side. GI: Soft, NTTP, non-distended  Extremities: NTTP, no peripheral edema.  ASSESSMENT/PLAN:   Assessment & Plan Ketosis-prone diabetes mellitus (HCC) -routine A1c, urine acr and gfr monitoring today -advised use of extra SAI while on prednisone , 2 units for BG > 250 after using normal SAI dose. Encounter for immunization -flu vaccine administered today COPD with acute exacerbation (HCC) -5 days 50mg  prednisone  for ongoing inflammation -continue home inhalers -continue home oxygen  -follow up in 2 weeks -plan to repeat chest CT in December for monitoring of lymph nodes.   Lucie Pinal, DO [facility]

## 2024-03-15 NOTE — Assessment & Plan Note (Signed)
>>  ASSESSMENT AND PLAN FOR KETOSIS-PRONE DIABETES MELLITUS (HCC) WRITTEN ON 03/15/2024  5:26 PM BY Dragon Thrush, DO  -routine A1c, urine acr and gfr monitoring today -advised use of extra SAI while on prednisone , 2 units for BG > 250 after using normal SAI dose.

## 2024-03-16 ENCOUNTER — Ambulatory Visit: Payer: Self-pay | Admitting: Family Medicine

## 2024-03-16 LAB — BASIC METABOLIC PANEL WITH GFR
BUN/Creatinine Ratio: 18 (ref 12–28)
BUN: 15 mg/dL (ref 8–27)
CO2: 22 mmol/L (ref 20–29)
Calcium: 8.7 mg/dL (ref 8.7–10.3)
Chloride: 102 mmol/L (ref 96–106)
Creatinine, Ser: 0.83 mg/dL (ref 0.57–1.00)
Glucose: 120 mg/dL — ABNORMAL HIGH (ref 70–99)
Potassium: 4.3 mmol/L (ref 3.5–5.2)
Sodium: 140 mmol/L (ref 134–144)
eGFR: 78 mL/min/1.73 (ref >59)

## 2024-03-16 LAB — MICROALBUMIN / CREATININE URINE RATIO
Creatinine, Urine: 228 mg/dL
Microalb/Creat Ratio: 10 mg/g{creat} (ref 0–29)
Microalbumin, Urine: 22.1 ug/mL

## 2024-03-18 ENCOUNTER — Encounter: Payer: Self-pay | Admitting: Pulmonary Disease

## 2024-03-21 DIAGNOSIS — C321 Malignant neoplasm of supraglottis: Secondary | ICD-10-CM | POA: Diagnosis not present

## 2024-03-21 DIAGNOSIS — Z930 Tracheostomy status: Secondary | ICD-10-CM | POA: Diagnosis not present

## 2024-03-21 DIAGNOSIS — Z96652 Presence of left artificial knee joint: Secondary | ICD-10-CM | POA: Diagnosis not present

## 2024-03-21 DIAGNOSIS — M1712 Unilateral primary osteoarthritis, left knee: Secondary | ICD-10-CM | POA: Diagnosis not present

## 2024-03-21 DIAGNOSIS — C329 Malignant neoplasm of larynx, unspecified: Secondary | ICD-10-CM | POA: Diagnosis not present

## 2024-03-23 ENCOUNTER — Other Ambulatory Visit: Payer: Self-pay

## 2024-03-23 ENCOUNTER — Telehealth: Payer: Self-pay | Admitting: *Deleted

## 2024-03-23 ENCOUNTER — Telehealth: Payer: Self-pay | Admitting: Pulmonary Disease

## 2024-03-23 NOTE — Telephone Encounter (Signed)
 Copied from CRM [id number]. Topic: Clinical - Prescription Issue >> Mar 03, 2024 11:31 AM Nathanel DEL wrote: Reason for CRM: pt states she needs Rx for her compressor, goes over trach.  Mask ttht goes over traach >> Mar 23, 2024 12:35 PM Whitney O wrote: Patient is calling because the DME company say they have not received the prescription for the equipment for the mask and bottle that goes on compressor . And a bag  [employer]  Patient is needing the prescription sent to the dme company because they are saying they dont have patient also has not received the prescription from the [employer] either  [id number]       Spoke with Avelina from [employer]. She spoke with patient to clairfy the above from her patient call. [employer] needs an order (updated RX) for what patient has stated above. She also was inquiring about an ONO.(The patient)

## 2024-03-23 NOTE — Telephone Encounter (Signed)
 Called [employer]. Deborah [name] will be calling PT for further claification about what she needs.

## 2024-03-23 NOTE — Telephone Encounter (Signed)
 Copied from CRM [id number]. Topic: Clinical - Prescription Issue >> Mar 03, 2024 11:31 AM Nathanel DEL wrote: Reason for CRM: pt states she needs Rx for her compressor, goes over trach.  Mask ttht goes over traach >> Mar 23, 2024 12:35 PM Whitney O wrote: Patient is calling because the DME company say they have not received the prescription for the equipment for the mask and bottle that goes on compressor . And a bag  [employer]  Patient is needing the prescription sent to the dme company because they are saying they dont have patient also has not received the prescription from the [employer] either  [id number]   Routing to [facility] to see what the issue with our referral is. It was documented that it was received. Please advise, thanks!

## 2024-03-24 ENCOUNTER — Other Ambulatory Visit: Payer: Self-pay

## 2024-03-24 DIAGNOSIS — Z930 Tracheostomy status: Secondary | ICD-10-CM

## 2024-03-24 DIAGNOSIS — J432 Centrilobular emphysema: Secondary | ICD-10-CM

## 2024-03-24 NOTE — Telephone Encounter (Signed)
 Order was placed on 03/15/24

## 2024-03-25 ENCOUNTER — Ambulatory Visit (INDEPENDENT_AMBULATORY_CARE_PROVIDER_SITE_OTHER): Admitting: Podiatry

## 2024-03-25 ENCOUNTER — Other Ambulatory Visit: Payer: Self-pay

## 2024-03-25 ENCOUNTER — Telehealth: Payer: Self-pay | Admitting: Family Medicine

## 2024-03-25 ENCOUNTER — Encounter: Payer: Self-pay | Admitting: Podiatry

## 2024-03-25 DIAGNOSIS — M79674 Pain in right toe(s): Secondary | ICD-10-CM | POA: Diagnosis not present

## 2024-03-25 DIAGNOSIS — M79675 Pain in left toe(s): Secondary | ICD-10-CM

## 2024-03-25 DIAGNOSIS — B351 Tinea unguium: Secondary | ICD-10-CM | POA: Diagnosis not present

## 2024-03-25 DIAGNOSIS — E1140 Type 2 diabetes mellitus with diabetic neuropathy, unspecified: Secondary | ICD-10-CM

## 2024-03-25 DIAGNOSIS — E1149 Type 2 diabetes mellitus with other diabetic neurological complication: Secondary | ICD-10-CM

## 2024-03-25 DIAGNOSIS — Z930 Tracheostomy status: Secondary | ICD-10-CM

## 2024-03-25 NOTE — Telephone Encounter (Signed)
 The new order it was already placed

## 2024-03-25 NOTE — Telephone Encounter (Signed)
 Pt request refill of:  Name of Medication(s):  Recent antibiotic Last date of OV:  03/15/2024 Pharmacy:  [facility]  Will route refill request to Team CMA.  Discussed with patient policy to call pharmacy for future refills.  Also, discussed refills may take up to 48 hours to approve or deny.  Deborah [name]

## 2024-03-25 NOTE — Telephone Encounter (Signed)
 Yankauer Style Suction catheter - This was the order for the [facility]. Pt is requesting the other items listed that I copied from her call and needs an additional order for them. Please advise.

## 2024-03-25 NOTE — Telephone Encounter (Signed)
 New order has been placed for DOD to sign-    -NFN

## 2024-03-26 MED ORDER — AZITHROMYCIN 500 MG PO TABS: 500.0000 mg | ORAL_TABLET | Freq: Every day | ORAL | 0 refills | Status: DC

## 2024-03-28 NOTE — Progress Notes (Signed)
 Subjective:   Patient ID: Deborah [name], female   DOB: 68 y.o.   MRN: [id number]   HPI Patient presents with new problem stating that she is getting numbness in her left foot and also has nail disease with thickness 1-5 both feet that she is not able to take care of   ROS      Objective:  Physical Exam  Vascular status was found to be intact neurologically I did note a diminishment of both sharp dull vibratory with patient found to have moderate numbness upon palpation.  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  Neuropathy right now is mild I advised on good control A1c and stretching exercises soaks and if it were to get worse we may need to do testing.  I debrided nailbeds 1-5 both feet and iatrogenically reappoint routine care

## 2024-03-29 ENCOUNTER — Encounter: Payer: Self-pay | Admitting: Family Medicine

## 2024-03-29 ENCOUNTER — Ambulatory Visit: Payer: Self-pay | Admitting: Family Medicine

## 2024-03-29 VITALS — BP 106/66 | HR 88 | Ht 67.0 in | Wt 239.2 lb

## 2024-03-29 DIAGNOSIS — R1032 Left lower quadrant pain: Secondary | ICD-10-CM | POA: Diagnosis not present

## 2024-03-29 DIAGNOSIS — E109 Type 1 diabetes mellitus without complications: Secondary | ICD-10-CM

## 2024-03-29 DIAGNOSIS — J432 Centrilobular emphysema: Secondary | ICD-10-CM | POA: Diagnosis not present

## 2024-03-29 DIAGNOSIS — J449 Chronic obstructive pulmonary disease, unspecified: Secondary | ICD-10-CM

## 2024-03-29 DIAGNOSIS — J4540 Moderate persistent asthma, uncomplicated: Secondary | ICD-10-CM

## 2024-03-29 DIAGNOSIS — J441 Chronic obstructive pulmonary disease with (acute) exacerbation: Secondary | ICD-10-CM

## 2024-03-29 DIAGNOSIS — R0982 Postnasal drip: Secondary | ICD-10-CM | POA: Diagnosis not present

## 2024-03-29 DIAGNOSIS — E1159 Type 2 diabetes mellitus with other circulatory complications: Secondary | ICD-10-CM

## 2024-03-29 DIAGNOSIS — I152 Hypertension secondary to endocrine disorders: Secondary | ICD-10-CM

## 2024-03-29 DIAGNOSIS — Z90710 Acquired absence of both cervix and uterus: Secondary | ICD-10-CM | POA: Diagnosis not present

## 2024-03-29 DIAGNOSIS — F339 Major depressive disorder, recurrent, unspecified: Secondary | ICD-10-CM | POA: Diagnosis not present

## 2024-03-29 MED ORDER — INSULIN LISPRO (1 UNIT DIAL) 100 UNIT/ML (KWIKPEN): 10.0000 [IU] | PEN_INJECTOR | Freq: Three times a day (TID) | SUBCUTANEOUS | 6 refills | Status: DC

## 2024-03-29 MED ORDER — REVEFENACIN 175 MCG/3ML IN SOLN
175.0000 ug | Freq: Every day | RESPIRATORY_TRACT | 6 refills | Status: DC | PRN
Start: 2024-03-29 — End: 2024-03-30

## 2024-03-29 MED ORDER — AMPHETAMINE-DEXTROAMPHETAMINE 20 MG PO TABS
20.0000 mg | ORAL_TABLET | Freq: Two times a day (BID) | ORAL | 0 refills | Status: AC
Start: 2024-03-29 — End: ?

## 2024-03-29 MED ORDER — AMLODIPINE BESYLATE 10 MG PO TABS: 10.0000 mg | ORAL_TABLET | Freq: Every day | ORAL | 0 refills | Status: DC

## 2024-03-29 MED ORDER — METFORMIN HCL ER 500 MG PO TB24: 500.0000 mg | ORAL_TABLET | Freq: Every day | ORAL | 11 refills | Status: AC

## 2024-03-29 MED ORDER — LANTUS SOLOSTAR 100 UNIT/ML ~~LOC~~ SOPN: 35.0000 [IU] | PEN_INJECTOR | Freq: Every day | SUBCUTANEOUS | 2 refills | Status: AC

## 2024-03-29 MED ORDER — BUPROPION HCL ER (XL) 150 MG PO TB24: 150.0000 mg | ORAL_TABLET | Freq: Every day | ORAL | 11 refills | Status: AC

## 2024-03-29 MED ORDER — MONTELUKAST SODIUM 10 MG PO TABS: 10.0000 mg | ORAL_TABLET | Freq: Every day | ORAL | 0 refills | Status: DC

## 2024-03-29 MED ORDER — FLUTICASONE PROPIONATE 50 MCG/ACT NA SUSP: 1.0000 | Freq: Two times a day (BID) | NASAL | 0 refills | Status: DC

## 2024-03-29 MED ORDER — CARVEDILOL 12.5 MG PO TABS: 12.5000 mg | ORAL_TABLET | Freq: Two times a day (BID) | ORAL | 2 refills | Status: AC

## 2024-03-29 MED ORDER — CETIRIZINE HCL 10 MG PO TABS: 10.0000 mg | ORAL_TABLET | Freq: Every day | ORAL | 0 refills | Status: DC

## 2024-03-29 MED ORDER — FORMOTEROL FUMARATE 20 MCG/2ML IN NEBU: 20.0000 ug | INHALATION_SOLUTION | Freq: Two times a day (BID) | RESPIRATORY_TRACT | 6 refills | Status: DC

## 2024-03-29 MED ORDER — ONDANSETRON HCL 4 MG PO TABS: 4.0000 mg | ORAL_TABLET | Freq: Three times a day (TID) | ORAL | 0 refills | Status: AC | PRN

## 2024-03-29 MED ORDER — SEMAGLUTIDE (1 MG/DOSE) 4 MG/3ML ~~LOC~~ SOPN: 1.0000 mg | PEN_INJECTOR | SUBCUTANEOUS | 3 refills | Status: DC

## 2024-03-29 MED ORDER — ESCITALOPRAM OXALATE 20 MG PO TABS: 20.0000 mg | ORAL_TABLET | Freq: Every day | ORAL | 3 refills | Status: DC

## 2024-03-29 MED ORDER — LEVOTHYROXINE SODIUM 75 MCG PO TABS: 225.0000 ug | ORAL_TABLET | Freq: Every day | ORAL | 0 refills | Status: DC

## 2024-03-29 MED ORDER — HYDROXYZINE HCL 25 MG PO TABS: 25.0000 mg | ORAL_TABLET | Freq: Three times a day (TID) | ORAL | 0 refills | Status: DC | PRN

## 2024-03-29 MED ORDER — PANTOPRAZOLE SODIUM 40 MG PO TBEC: 40.0000 mg | DELAYED_RELEASE_TABLET | Freq: Every day | ORAL | 0 refills | Status: DC

## 2024-03-29 MED ORDER — LOSARTAN POTASSIUM 25 MG PO TABS: 25.0000 mg | ORAL_TABLET | Freq: Every day | ORAL | 3 refills | Status: DC

## 2024-03-29 MED ORDER — ROSUVASTATIN CALCIUM 40 MG PO TABS: 40.0000 mg | ORAL_TABLET | Freq: Every day | ORAL | 2 refills | Status: AC

## 2024-03-29 MED ORDER — NAPROXEN 500 MG PO TABS: 500.0000 mg | ORAL_TABLET | Freq: Two times a day (BID) | ORAL | 0 refills | Status: DC

## 2024-03-29 MED ORDER — BUDESONIDE 0.5 MG/2ML IN SUSP: 0.5000 mg | Freq: Every day | RESPIRATORY_TRACT | 11 refills | Status: AC

## 2024-03-29 NOTE — Patient Instructions (Signed)
 It was wonderful to see you today!  I am glad that you are starting to feel more like yourself.  I suspect that the pain you are having is costochondritis from your illness.  I have prescribed Naprosyn  500 mg to be taken twice a day for the next 14 days.  If the discomfort you are feeling does not improve within that time, please feel free to reach out to the office for follow-up.  Otherwise I will see you again in December for your next diabetes check-in.  Please call [phone] with any questions about today's appointment.   If you need any additional refills, please call your pharmacy before calling the office.  Lucie Pinal, DO Family Medicine

## 2024-03-29 NOTE — Assessment & Plan Note (Signed)
-   Repeat lipid panel today for routine monitoring -Patient due for next hemoglobin A1c in December, patient will schedule appointment and follow-up at that time -As always I am available in the interim if needed.

## 2024-03-29 NOTE — Assessment & Plan Note (Signed)
-   Based on recent illness as well as duration of symptom, suspect chest pain is secondary to costochondritis.  Will trial 2-week course of Naprosyn  500 mg twice daily, and patient will follow-up if pain does not resolve -Overall patient reports significant improvement in her symptoms and no further concerns. -Continue all other medications as prescribed -Provided updated DME prescription for tubing and replacement mask and tubing connectors for patient's nebulizer and tracheostomy humidifier compressor. - Updated prescriptions for patient's needed medications were sent into her mail-in pharmacy, reviewed patient's medications and removed any which are no longer active.

## 2024-03-29 NOTE — Assessment & Plan Note (Signed)
>>  ASSESSMENT AND PLAN FOR STATUS POST TRACHELECTOMY WRITTEN ON 03/29/2024  3:09 PM BY Colbi Staubs, DO  - Based on recent illness as well as duration of symptom, suspect chest pain is secondary to costochondritis.  Will trial 2-week course of Naprosyn  500 mg twice daily, and patient will follow-up if pain does not resolve -Overall patient reports significant improvement in her symptoms and no further concerns. -Continue all other medications as prescribed -Provided updated DME prescription for tubing and replacement mask and tubing connectors for patient's nebulizer and tracheostomy humidifier compressor. - Updated prescriptions for patient's needed medications were sent into her mail-in pharmacy, reviewed patient's medications and removed any which are no longer active.

## 2024-03-29 NOTE — Assessment & Plan Note (Addendum)
-   Based on recent illness as well as duration of symptom, suspect chest pain is secondary to costochondritis.  Will trial 2-week course of Naprosyn  500 mg twice daily, and patient will follow-up if pain does not resolve -Overall patient reports significant improvement in her symptoms and no further concerns. -Continue all other medications as prescribed -Provided updated DME prescription for tubing and replacement mask and tubing connectors for patient's nebulizer and tracheostomy humidifier compressor. - Updated prescriptions for patient's needed medications were sent into her mail-in pharmacy, reviewed patient's medications and removed any which are no longer active.

## 2024-03-29 NOTE — Assessment & Plan Note (Signed)
>>  ASSESSMENT AND PLAN FOR KETOSIS-PRONE DIABETES MELLITUS (HCC) WRITTEN ON 03/29/2024  3:09 PM BY Michelene Keniston, DO  - Repeat lipid panel today for routine monitoring -Patient due for next hemoglobin A1c in December, patient will schedule appointment and follow-up at that time -As always I am available in the interim if needed.

## 2024-03-29 NOTE — Progress Notes (Signed)
    SUBJECTIVE:   CHIEF COMPLAINT / HPI:   COPD exacerbation follow-up  Requested ABX refill last week-patient has completed course, reports significant improvement in breathing.  Still having central chest discomfort.  She reports that it is worse when she presses on it but otherwise does not change in caliber.  It is constant and aching.  It has been present since her discharge from the hospital.  She also reports that she has been feeling tired and having headaches along with that.  She reports that she had a low blood pressure at home of 83/75.  She denies any dizziness, loss of consciousness or other concerning symptoms.  She denies any new shortness of breath, sharp chest pain that radiates or other concerning red flag symptoms.  Needs updated lipid panel per [facility]  Patient also requests new prescriptions to be sent to her mail-in pharmacy, as well as an updated DME prescription for her long-term tracheostomy humidifier supplies.  This is a lifetime need as the patient is tracheostomy dependent and this allows her to live and breathe comfortably.  PERTINENT  PMH / PSH: Hypertension, type 2 diabetes, COPD, CHF, status post tracheostomy  OBJECTIVE:   BP 106/66   Pulse 88   Ht 5' 7 (1.702 m)   Wt 239 lb 3.2 oz (108.5 kg)   SpO2 93%   BMI 37.46 kg/m   General: Well-appearing, no distress Cardiac: RRR, no M/R/G.  Peristernal chest pain reproducible on palpation, worse on right lower edge of sternum. Respiratory: CTAB, no increased work of breathing Extremities: Warm, well-perfused.  2+ pulses laterally  ASSESSMENT/PLAN:   Assessment & Plan Chronic obstructive pulmonary disease with acute exacerbation (HCC) Status post trachelectomy - Based on recent illness as well as duration of symptom, suspect chest pain is secondary to costochondritis.  Will trial 2-week course of Naprosyn  500 mg twice daily, and patient will follow-up if pain does not resolve -Overall patient reports  significant improvement in her symptoms and no further concerns. -Continue all other medications as prescribed -Provided updated DME prescription for tubing and replacement mask and tubing connectors for patient's nebulizer and tracheostomy humidifier compressor. - Updated prescriptions for patient's needed medications were sent into her mail-in pharmacy, reviewed patient's medications and removed any which are no longer active. Ketosis-prone diabetes mellitus (HCC) - Repeat lipid panel today for routine monitoring -Patient due for next hemoglobin A1c in December, patient will schedule appointment and follow-up at that time -As always I am available in the interim if needed.   Lucie Pinal, DO [facility]

## 2024-03-30 ENCOUNTER — Ambulatory Visit: Payer: Self-pay | Admitting: Family Medicine

## 2024-03-30 ENCOUNTER — Encounter: Payer: Self-pay | Admitting: Pharmacist

## 2024-03-30 ENCOUNTER — Ambulatory Visit (INDEPENDENT_AMBULATORY_CARE_PROVIDER_SITE_OTHER): Admitting: Pharmacist

## 2024-03-30 VITALS — BP 93/55 | Wt 241.0 lb

## 2024-03-30 DIAGNOSIS — I152 Hypertension secondary to endocrine disorders: Secondary | ICD-10-CM

## 2024-03-30 DIAGNOSIS — Z794 Long term (current) use of insulin: Secondary | ICD-10-CM

## 2024-03-30 DIAGNOSIS — E1169 Type 2 diabetes mellitus with other specified complication: Secondary | ICD-10-CM | POA: Diagnosis not present

## 2024-03-30 DIAGNOSIS — J432 Centrilobular emphysema: Secondary | ICD-10-CM

## 2024-03-30 DIAGNOSIS — R0982 Postnasal drip: Secondary | ICD-10-CM

## 2024-03-30 DIAGNOSIS — F339 Major depressive disorder, recurrent, unspecified: Secondary | ICD-10-CM

## 2024-03-30 DIAGNOSIS — E109 Type 1 diabetes mellitus without complications: Secondary | ICD-10-CM

## 2024-03-30 LAB — LIPID PANEL
Chol/HDL Ratio: 5.1 ratio — ABNORMAL HIGH (ref 0.0–4.4)
Cholesterol, Total: 208 mg/dL — ABNORMAL HIGH (ref 100–199)
HDL: 41 mg/dL (ref >39)
LDL Chol Calc (NIH): 127 mg/dL — ABNORMAL HIGH (ref 0–99)
Triglycerides: 225 mg/dL — ABNORMAL HIGH (ref 0–149)
VLDL Cholesterol Cal: 40 mg/dL (ref 5–40)

## 2024-03-30 MED ORDER — IPRATROPIUM BROMIDE 0.03 % NA SOLN: 2.0000 | Freq: Two times a day (BID) | NASAL | 3 refills | Status: DC

## 2024-03-30 MED ORDER — INSULIN LISPRO (1 UNIT DIAL) 100 UNIT/ML (KWIKPEN)
PEN_INJECTOR | SUBCUTANEOUS | Status: DC
Start: 2024-03-30 — End: 2024-05-03

## 2024-03-30 MED ORDER — ESCITALOPRAM OXALATE 20 MG PO TABS: 20.0000 mg | ORAL_TABLET | Freq: Every day | ORAL | 3 refills | Status: AC

## 2024-03-30 MED ORDER — HYDROXYZINE HCL 25 MG PO TABS: 25.0000 mg | ORAL_TABLET | Freq: Three times a day (TID) | ORAL | 3 refills | Status: DC | PRN

## 2024-03-30 MED ORDER — FLUTICASONE PROPIONATE 50 MCG/ACT NA SUSP: 1.0000 | Freq: Two times a day (BID) | NASAL | 5 refills | Status: AC

## 2024-03-30 MED ORDER — REVEFENACIN 175 MCG/3ML IN SOLN: 175.0000 ug | Freq: Every day | RESPIRATORY_TRACT | 3 refills | Status: DC

## 2024-03-30 NOTE — Assessment & Plan Note (Signed)
 Diabetes longstanding currently controlled. Patient is able to verbalize appropriate hypoglycemia management plan. Medication adherence appears good. -Continued basal insulin  Lantus /Basaglar /Semglee  (insulin  glargine) 35 units daily in the morning.  -Decreased dose of rapid insulin  Humalog  (insulin  lispro) from 12 units to 8 units in the morning dose only. Continue 12 units with lunch and dinner.  -Continued GLP-1 Ozempic  (semaglutide ) 1 mg .  -Continued metformin  500mg  XR once daily.  -Patient educated on purpose, proper use, and potential adverse effects of insulin .  -Extensively discussed pathophysiology of diabetes, recommended lifestyle interventions, dietary effects on blood sugar control.  -Counseled on s/sx of and management of hypoglycemia.

## 2024-03-30 NOTE — Progress Notes (Signed)
 S:     Chief Complaint  Patient presents with   Medication Management    DM    68 y.o. female who presents for diabetes evaluation, education, and management. Patient arrives in good spirits and presents without any assistance.  Patient was referred and last seen by Primary Care Provider, Dr. Cleotilde, on 03/15/24 following hospitalization for hypoxia/COPD exacerbation. Pt being seen today for DM management/check-up. Pt is on 2L of O2 at home.  PMH is significant for DM, hypertension, hyperlipidemia, COPD.   Family/Social History: heart disease in both parents, DM in sister.  Current diabetes medications include: insulin  glargine 35 units, insulin  lispro 12 units TIDAC, semaglutide  1mg , metformin  500mg  XR Current hypertension medications include: amlodipine  10mg , losartan  25mg  Current hyperlipidemia medications include: rosuvastatin  40mg   Patient reports adherence to taking all medications as prescribed.   Do you feel that your medications are working for you? No - pt is unsatisfied with lack of weight loss Have you been experiencing any side effects to the medications prescribed? no  Patient reports hypoglycemic events in the late morning.  Patient reports neuropathy (nerve pain), new onset in the last few weeks.  Patient reported dietary habits: Eats 3 meals/day  Patient-reported exercise habits: unable to exercise due to COPD and lower extremity neuropathy  O:   Review of Systems  Respiratory:  Positive for shortness of breath.   Neurological:  Positive for tingling (numbness in bilateral feet).  All other systems reviewed and are negative.   Physical Exam Vitals reviewed.  Constitutional:      Appearance: Normal appearance.  Pulmonary:     Effort: Pulmonary effort is normal.  Neurological:     Mental Status: She is alert.  Psychiatric:        Mood and Affect: Mood normal.        Behavior: Behavior normal.        Thought Content: Thought content normal.         Judgment: Judgment normal.     Libre3 CGM Download today 03/30/24 % Time CGM is active: 96% Average Glucose: 140 mg/dL Glucose Management Indicator: 6.7%  Glucose Variability: 42% (goal <36%) Time in Goal:  - Time in range 70-180: 83% - Time above range: 15% - Time below range: 2% Observed patterns:  Lab Results  Component Value Date   HGBA1C 7.3 (A) 03/15/2024   Vitals:   03/30/24 0852  BP: (!) 93/55  SpO2: 95%    Lipid Panel     Component Value Date/Time   CHOL 208 (H) 03/29/2024 1439   TRIG 225 (H) 03/29/2024 1439   HDL 41 03/29/2024 1439   CHOLHDL 5.1 (H) 03/29/2024 1439   CHOLHDL 2.9 03/20/2011 0840   VLDL 15 03/20/2011 0840   LDLCALC 127 (H) 03/29/2024 1439    Clinical Atherosclerotic Cardiovascular Disease (ASCVD): No  The 10-year ASCVD risk score (Arnett DK, et al., 2019) is: 11.8%   Values used to calculate the score:     Age: 33 years     Clincally relevant sex: Female     Is Non-Hispanic African American: Yes     Diabetic: Yes     Tobacco smoker: No     Systolic Blood Pressure: 93 mmHg     Is BP treated: Yes     HDL Cholesterol: 41 mg/dL     Total Cholesterol: 208 mg/dL   A/P: Diabetes longstanding currently controlled. Patient is able to verbalize appropriate hypoglycemia management plan. Medication adherence appears good. -Continued basal insulin   Lantus /Basaglar /Semglee  (insulin  glargine) 35 units daily in the morning.  -Decreased dose of rapid insulin  Humalog  (insulin  lispro) from 12 units to 8 units in the morning dose only. Continue 12 units with lunch and dinner.  -Continued GLP-1 Ozempic  (semaglutide ) 1 mg .  -Continued metformin  500mg  XR once daily.  -Patient educated on purpose, proper use, and potential adverse effects of insulin .  -Extensively discussed pathophysiology of diabetes, recommended lifestyle interventions, dietary effects on blood sugar control.  -Counseled on s/sx of and management of hypoglycemia.  -Next A1c anticipated  12/25.   ASCVD risk - primary prevention in patient with diabetes. Last LDL is 127 mg/dL not at goal of <29 mg/dL. ASCVD risk factors include hypertension, DM, hyperlipidemia, smoking history. 10-year ASCVD risk score of 15.7%. high intensity statin indicated.  -Continued rosuvastatin  40 mg.   Hypertension longstanding currently controlled. Blood pressure goal of <130/80 mmHg. Medication adherence good.  -Continued amlodipine  10 mg and losartan  25 mg  Written patient instructions provided. Patient verbalized understanding of treatment plan.  Total time in face to face counseling 53 minutes.    Follow-up:  Pharmacist on 11/18 @ 0930 Patient seen with Lawson Mao, PharmD Candidate - PY3 student and Belvie Macintosh, PharmD - PY4 Candidate.

## 2024-03-30 NOTE — Patient Instructions (Addendum)
 It was nice to see you today!  Your goal blood sugar is 80-130 before eating and less than 180 after eating.  Medication Changes: Decrease dose of Humalog  (insulin  lispro) (insulin  lispro) from 12 units to 8 units in the morning dose only. Take with food. Continue taking 12 units of Humalog  (insulin  lispro) with lunch and dinner  Continue all other medication the same.   Keep up the good work with diet and exercise. Aim for a diet full of vegetables, fruit and lean meats (chicken, [place], fish). Try to limit salt intake by eating fresh or frozen vegetables (instead of canned), rinse canned vegetables prior to cooking and do not add any additional salt to meals.

## 2024-03-30 NOTE — Assessment & Plan Note (Signed)
 ASCVD risk - primary prevention in patient with diabetes. Last LDL is 127 mg/dL not at goal of <29 mg/dL. ASCVD risk factors include hypertension, DM, hyperlipidemia, smoking history. 10-year ASCVD risk score of 15.7%. high intensity statin indicated.  -Continued rosuvastatin  40 mg.

## 2024-03-30 NOTE — Assessment & Plan Note (Signed)
 Hypertension longstanding currently controlled. Blood pressure goal of <130/80 mmHg. Medication adherence good.  -Continued amlodipine  10 mg and losartan  25 mg

## 2024-04-01 NOTE — Progress Notes (Signed)
 Reviewed and agree with Dr Rennis plan.

## 2024-04-08 ENCOUNTER — Telehealth: Payer: Self-pay | Admitting: Family Medicine

## 2024-04-08 DIAGNOSIS — Z930 Tracheostomy status: Secondary | ICD-10-CM | POA: Diagnosis not present

## 2024-04-08 DIAGNOSIS — M1712 Unilateral primary osteoarthritis, left knee: Secondary | ICD-10-CM | POA: Diagnosis not present

## 2024-04-08 DIAGNOSIS — C329 Malignant neoplasm of larynx, unspecified: Secondary | ICD-10-CM | POA: Diagnosis not present

## 2024-04-08 DIAGNOSIS — C321 Malignant neoplasm of supraglottis: Secondary | ICD-10-CM | POA: Diagnosis not present

## 2024-04-08 DIAGNOSIS — Z96652 Presence of left artificial knee joint: Secondary | ICD-10-CM | POA: Diagnosis not present

## 2024-04-08 NOTE — Telephone Encounter (Signed)
 Patient came in stating she needs to have a call from Dr.Miller asap about her supplies being refilled.  Thank you.

## 2024-04-11 ENCOUNTER — Telehealth: Payer: Self-pay

## 2024-04-11 NOTE — Telephone Encounter (Signed)
  Spoke with [facility] [employer] in the [place] office and she reached out to the RT about the ONO and will find out about this as they do have the order. She gave me the number of [phone] to reach out to the [employer] at [employer] also. Called and spoke with Laquanna and she states that all the pt needs to do it call the line and request her supplies as she sees the order on her account. Spoke with pt who states this is the same number she's been calling but she will reach out again to them.   Copied from CRM #[id number]. Topic: Clinical - Order For Equipment >> Apr 11, 2024  9:13 AM Nathanel DEL wrote: Reason for CRM: pt states she has been waiting since 03/25/2024 for the over nite Oximetry test.  She has heard from no one.  She said she went to [facility] on Friday and the danced around her.  She still has no trach supplies either. She needs mask and cup.  She said she just keeps washing the old ones out and re using. Pt states she is very frustrated over all this and cannot seem to get any help. She said she keeps calling the trach line, but no one calls her back.

## 2024-04-11 NOTE — Telephone Encounter (Signed)
 Patient has been receiving trach supplies through pulmonologist.   Called patient. Patient asks that we send order over as she has been having issues with getting supplies.   Community message sent to [employer] for trach supplies.   Deborah JAYSON English, RN

## 2024-04-12 ENCOUNTER — Other Ambulatory Visit: Payer: Self-pay | Admitting: Family Medicine

## 2024-04-18 ENCOUNTER — Other Ambulatory Visit: Payer: Self-pay | Admitting: Family Medicine

## 2024-04-28 ENCOUNTER — Telehealth: Payer: Self-pay | Admitting: Family Medicine

## 2024-05-03 ENCOUNTER — Encounter: Payer: Self-pay | Admitting: Pharmacist

## 2024-05-03 ENCOUNTER — Ambulatory Visit: Admitting: Pharmacist

## 2024-05-03 VITALS — BP 98/62 | HR 84 | Wt 236.6 lb

## 2024-05-03 DIAGNOSIS — Z794 Long term (current) use of insulin: Secondary | ICD-10-CM

## 2024-05-03 DIAGNOSIS — J4540 Moderate persistent asthma, uncomplicated: Secondary | ICD-10-CM

## 2024-05-03 DIAGNOSIS — I152 Hypertension secondary to endocrine disorders: Secondary | ICD-10-CM

## 2024-05-03 DIAGNOSIS — E109 Type 1 diabetes mellitus without complications: Secondary | ICD-10-CM

## 2024-05-03 DIAGNOSIS — E1169 Type 2 diabetes mellitus with other specified complication: Secondary | ICD-10-CM

## 2024-05-03 DIAGNOSIS — G629 Polyneuropathy, unspecified: Secondary | ICD-10-CM

## 2024-05-03 DIAGNOSIS — E1159 Type 2 diabetes mellitus with other circulatory complications: Secondary | ICD-10-CM

## 2024-05-03 DIAGNOSIS — E785 Hyperlipidemia, unspecified: Secondary | ICD-10-CM

## 2024-05-03 MED ORDER — HYDROXYZINE HCL 25 MG PO TABS: 25.0000 mg | ORAL_TABLET | Freq: Three times a day (TID) | ORAL | 3 refills | Status: AC | PRN

## 2024-05-03 MED ORDER — MONTELUKAST SODIUM 10 MG PO TABS: 10.0000 mg | ORAL_TABLET | Freq: Every day | ORAL | 0 refills | Status: DC

## 2024-05-03 MED ORDER — AMLODIPINE BESYLATE 5 MG PO TABS: 5.0000 mg | ORAL_TABLET | Freq: Every day | ORAL | 3 refills | Status: AC

## 2024-05-03 MED ORDER — CETIRIZINE HCL 10 MG PO TABS: 10.0000 mg | ORAL_TABLET | Freq: Every day | ORAL | 0 refills | Status: AC

## 2024-05-03 MED ORDER — GABAPENTIN 100 MG PO CAPS: 100.0000 mg | ORAL_CAPSULE | Freq: Three times a day (TID) | ORAL | 1 refills | Status: DC

## 2024-05-03 MED ORDER — INSULIN LISPRO (1 UNIT DIAL) 100 UNIT/ML (KWIKPEN): PEN_INJECTOR | SUBCUTANEOUS | Status: AC

## 2024-05-03 NOTE — Progress Notes (Signed)
 S:     Chief Complaint  Patient presents with   Medication Management    DM f/u   68 y.o. female who presents for diabetes evaluation, education, and management. Patient arrives in good spirits and presents without any assistance.   Patient was referred and last seen by Primary Care Provider, Dr. Cleotilde, on 03/29/24.   PMH is significant for DM, hypertension, hyperlipidemia, COPD.    Family/Social History: heart disease in both parents, DM in sister.  Current diabetes medications include: insulin  glargine 35 units, insulin  lispro 8 units at breakfast and 10 units before lunch and dinner, semaglutide  1mg , metformin  500mg  XR Current hypertension medications include: amlodipine  10mg , losartan  25mg  Current hyperlipidemia medications include: rosuvastatin  40mg   Patient reports adherence to taking all medications. Currently taking 10 units of lispro before lunch and dinner instead of the prescribed 12 units.   Patient denies hypoglycemic events. Patient reports nocturia (nighttime urination). Around 3 times per night.  Patient reports neuropathy (nerve pain). Lots of tingling in the feet so it is hard for her to move around. Patient denies visual changes. Patient reports self foot exams.   Patient reported dietary habits: Eats 3 meals/day Breakfast: oatmeal Lunch: salad  Snacks: cookies, donuts, cinnamon roll, ice cream  - not everyday but once in a while   O:   Review of Systems  All other systems reviewed and are negative.   Physical Exam Constitutional:      Appearance: Normal appearance.  Pulmonary:     Effort: Pulmonary effort is normal.  Neurological:     Mental Status: She is alert.  Psychiatric:        Mood and Affect: Mood normal.        Behavior: Behavior normal.        Thought Content: Thought content normal.        Judgment: Judgment normal.    Libre3 CGM Download today 04/20/24-05/03/24 % Time CGM is active: 95.8% Average Glucose: 149 mg/dL Glucose  Management Indicator: 6.9%  Glucose Variability: 24.9% (goal <36%) Time in Goal:  - Time in range 70-180: 81% - Time above range: 19% - Time below range: 0%  Lab Results  Component Value Date   HGBA1C 7.3 (A) 03/15/2024   Vitals:   05/03/24 0949  BP: 98/62  Pulse: 84  SpO2: 91%    Lipid Panel     Component Value Date/Time   CHOL 208 (H) 03/29/2024 1439   TRIG 225 (H) 03/29/2024 1439   HDL 41 03/29/2024 1439   CHOLHDL 5.1 (H) 03/29/2024 1439   CHOLHDL 2.9 03/20/2011 0840   VLDL 15 03/20/2011 0840   LDLCALC 127 (H) 03/29/2024 1439    Clinical Atherosclerotic Cardiovascular Disease (ASCVD):  The 10-year ASCVD risk score (Arnett DK, et al., 2019) is: 13.3%   Values used to calculate the score:     Age: 18 years     Clincally relevant sex: Female     Is Non-Hispanic African American: Yes     Diabetic: Yes     Tobacco smoker: No     Systolic Blood Pressure: 98 mmHg     Is BP treated: Yes     HDL Cholesterol: 41 mg/dL     Total Cholesterol: 208 mg/dL   A/P: Diabetes longstanding currently controlled. Patient is able to verbalize appropriate hypoglycemia management plan. Medication adherence appears good. Patient reported neuropathic symptoms in her feet with a lot of tingling.  -Continued basal insulin  Lantus  (insulin  glargine) 35 units daily in  the morning.  -Continued rapid insulin  Humalog  (insulin  lispro) 8 units for breakfast and 10 units before lunch and dinner.  -Continued GLP-1 Ozempic  (semaglutide ) 1 mg. Advised patient to dial  back on Ozempic  (semaglutide ) dose to see if it can resolve her newly-reported nausea.  -Continued metformin  500mg  XR once daily.  -Start gabapentin  100 mg three times daily.  -Patient educated on purpose, proper use, and potential adverse effects of insulin .  -Extensively discussed pathophysiology of diabetes, recommended lifestyle interventions, dietary effects on blood sugar control.  -Counseled on s/sx of and management of hypoglycemia.   -Next A1c anticipated 12/25.  ASCVD risk - primary prevention in patient with diabetes. Last LDL is 127 mg/dL not at goal of <29 mg/dL. ASCVD risk factors include hypertension, DM, hyperlipidemia, smoking history. 10-year ASCVD risk score of 13.3%. high intensity statin indicated.  -Continued rosuvastatin  40 mg.   Hypertension longstanding currently controlled. Blood pressure goal of <130/80  mmHg. Medication adherence good. Blood Pressure has been running soft during office visits. With her blood sugar is getting more controlled and she is losing weight, lower dose of antihypertensive medication could help reduce hypotension risk. -Decrease amlodipine  dose to 5 mg daily. Advised patient to continue using the 10 mg dose until the 5 mg dose is delivered.  -Continued losartan  25 mg.  Written patient instructions provided. Patient verbalized understanding of treatment plan.  Total time in face to face counseling 40 minutes.    Follow-up:  PCP clinic visit in 05/30/2024 Patient seen with Lawson Mao, PharmD Candidate - PY3 student and Recardo [employer] PharmD - PY4 Candidate.

## 2024-05-03 NOTE — Assessment & Plan Note (Signed)
 Hypertension longstanding currently controlled. Blood pressure goal of <130/80  mmHg. Medication adherence good. Blood Pressure has been running soft during office visit. With her blood sugar is getting more controlled and she is losing weight, lower dose of antihypertensive medication could help reduce hypotension risk. -Decrease amlodipine  dose to 5 mg daily. Advised patient to continue using the 10 mg dose until the 5 mg dose is delivered.  -Continued losartan  25 mg.

## 2024-05-03 NOTE — Patient Instructions (Addendum)
 It was nice to see you today!  Your goal blood sugar is 80-130 before eating and less than 180 after eating.  Medication Changes: Decrease amlodipine  dose to 5 mg daily. You can continue using the 10 mg dose until the 5 mg dose is delivered.   Start gabapentin  100 mg three times daily.   Continue all other medication the same.   Monitor blood sugars at home and keep a log (glucometer or piece of paper) to bring with you to your next visit.  Keep up the good work with diet and exercise. Aim for a diet full of vegetables, fruit and lean meats (chicken, turkey, fish). Try to limit salt intake by eating fresh or frozen vegetables (instead of canned), rinse canned vegetables prior to cooking and do not add any additional salt to meals.

## 2024-05-03 NOTE — Assessment & Plan Note (Signed)
Start gabapentin 100 mg three times daily

## 2024-05-03 NOTE — Assessment & Plan Note (Signed)
 ASCVD risk - primary prevention in patient with diabetes. Last LDL is 127 mg/dL not at goal of <29 mg/dL. ASCVD risk factors include hypertension, DM, hyperlipidemia, smoking history. 10-year ASCVD risk score of 13.3%. high intensity statin indicated.  -Continued rosuvastatin  40 mg.

## 2024-05-03 NOTE — Assessment & Plan Note (Signed)
 Diabetes longstanding currently controlled. Patient is able to verbalize appropriate hypoglycemia management plan. Medication adherence appears good. Patient reported neuropathic symptoms in her feet with a lot of tingling.  -Continued basal insulin  Lantus  (insulin  glargine) 35 units daily in the morning.  -Continued rapid insulin  Humalog  (insulin  lispro) 8 units for breakfast and 10 units before lunch and dinner.  -Continued GLP-1 Ozempic  (semaglutide ) 1 mg. Advised patient to dial  back on Ozempic  (semaglutide ) dose to see if it can resolve her newly-reported nausea.  -Continued metformin  500mg  XR once daily.  -Start gabapentin  100 mg three times daily.  -Patient educated on purpose, proper use, and potential adverse effects of insulin .  -Extensively discussed pathophysiology of diabetes, recommended lifestyle interventions, dietary effects on blood sugar control.  -Counseled on s/sx of and management of hypoglycemia.  -Next A1c anticipated 12/25.

## 2024-05-05 NOTE — Progress Notes (Signed)
 Reviewed and agree with Dr Rennis plan.

## 2024-05-08 ENCOUNTER — Other Ambulatory Visit: Payer: Self-pay | Admitting: Family Medicine

## 2024-05-11 ENCOUNTER — Encounter: Payer: Self-pay | Admitting: Pulmonary Disease

## 2024-05-30 ENCOUNTER — Ambulatory Visit: Admitting: Family Medicine

## 2024-05-31 ENCOUNTER — Ambulatory Visit (HOSPITAL_COMMUNITY): Admission: RE | Admit: 2024-05-31 | Discharge: 2024-05-31 | Attending: Pulmonary Disease

## 2024-05-31 DIAGNOSIS — R911 Solitary pulmonary nodule: Secondary | ICD-10-CM

## 2024-06-01 ENCOUNTER — Encounter: Payer: Self-pay | Admitting: Family Medicine

## 2024-06-01 ENCOUNTER — Ambulatory Visit: Admitting: Family Medicine

## 2024-06-01 VITALS — BP 123/69 | HR 80 | Wt 243.4 lb

## 2024-06-01 DIAGNOSIS — J4540 Moderate persistent asthma, uncomplicated: Secondary | ICD-10-CM | POA: Diagnosis not present

## 2024-06-01 DIAGNOSIS — R109 Unspecified abdominal pain: Secondary | ICD-10-CM

## 2024-06-01 DIAGNOSIS — J449 Chronic obstructive pulmonary disease, unspecified: Secondary | ICD-10-CM

## 2024-06-01 DIAGNOSIS — Z8669 Personal history of other diseases of the nervous system and sense organs: Secondary | ICD-10-CM | POA: Diagnosis not present

## 2024-06-01 DIAGNOSIS — E119 Type 2 diabetes mellitus without complications: Secondary | ICD-10-CM

## 2024-06-01 DIAGNOSIS — E1169 Type 2 diabetes mellitus with other specified complication: Secondary | ICD-10-CM

## 2024-06-01 DIAGNOSIS — B372 Candidiasis of skin and nail: Secondary | ICD-10-CM | POA: Diagnosis not present

## 2024-06-01 DIAGNOSIS — E785 Hyperlipidemia, unspecified: Secondary | ICD-10-CM | POA: Diagnosis not present

## 2024-06-01 DIAGNOSIS — E109 Type 1 diabetes mellitus without complications: Secondary | ICD-10-CM

## 2024-06-01 LAB — POCT GLYCOSYLATED HEMOGLOBIN (HGB A1C): HbA1c, POC (controlled diabetic range): 6.7 % (ref 0.0–7.0)

## 2024-06-01 LAB — HEMOCCULT GUIAC POC 1CARD (OFFICE): Fecal Occult Blood, POC: POSITIVE — AB

## 2024-06-01 MED ORDER — FORMOTEROL FUMARATE 20 MCG/2ML IN NEBU: 20.0000 ug | INHALATION_SOLUTION | Freq: Two times a day (BID) | RESPIRATORY_TRACT | 6 refills | Status: AC

## 2024-06-01 MED ORDER — NAPROXEN 500 MG PO TABS: 500.0000 mg | ORAL_TABLET | Freq: Two times a day (BID) | ORAL | 0 refills | Status: AC

## 2024-06-01 MED ORDER — NYSTATIN 100000 UNIT/GM EX POWD: 1.0000 | Freq: Three times a day (TID) | CUTANEOUS | 0 refills | Status: AC

## 2024-06-01 MED ORDER — MONTELUKAST SODIUM 10 MG PO TABS: 10.0000 mg | ORAL_TABLET | Freq: Every day | ORAL | 0 refills | Status: AC

## 2024-06-01 NOTE — Assessment & Plan Note (Addendum)
-   A1c today is 6.7, improved from 7.3 at  last visit -no changes today, but patient desire to switch from Ozempic  to a different GLP-1, will ask [facility] to run a test claim for mounjaro - next A1c in 3 months

## 2024-06-01 NOTE — Progress Notes (Signed)
° ° °  SUBJECTIVE:   CHIEF COMPLAINT / HPI:   Routine monitoring for diabetes, other chronic conditions - on crestor  40 mg, last lipid panel still had elevated LDL - making changes to diet and staying consistent with  her diabetes regimen - unhappy with her response to GLP-1, would like to try a different therapy if possible.  -BP well controlled, no dizziness or orthostasis -still having occasional headaches, naprosyn  works well for them, tylenol  does not - breathing has returned to baseline, no further shortness of breath.  - Follow up CT scan for new mass/opacity complete, results not yet available  GI Pain - started 1-2 weeks ago - generalized, no apparent triggers - has had increased reflux not well managed by current GERD medication - new daily liquid stools that are black - no nausea/vomiting  PERTINENT  PMH / PSH: T2DM, HTN, COPD, GERD  OBJECTIVE:   BP 123/69   Pulse 80   Wt 243 lb 6.4 oz (110.4 kg)   SpO2 96%   BMI 38.12 kg/m   General: A&O, NAD HEENT: No sign of trauma, EOM grossly intact Cardiac: RRR, no m/r/g Respiratory: CTAB, normal WOB, no w/c/r GI: Soft, Tender to palpation in the BL lower quadrants, non-distended Rectal: Stacy Reaves present as chaperone. No visible anal fissures or hemorrhoids. Appropriate sphincter tone. FOBT card collected Extremities: NTTP, no peripheral edema.   ASSESSMENT/PLAN:   Assessment & Plan Type 2 diabetes mellitus without complication, without long-term current use of insulin  (HCC) - A1c today is 6.7, improved from 7.3 at  last visit -no changes today, but patient desire to switch from Ozempic  to a different GLP-1, will ask [facility] to run a test claim for mounjaro - next A1c in 3 months Hyperlipidemia associated with type 2 diabetes mellitus (HCC) - repeat lipid panel today, LDL remained elevated at last check - discussed adjunct therapy with patient, she is amenable if indicated Stomach pain - new in the last two weeks  and accompanied by signs concerning for GI bleed -fobt in office is positive -advised avoiding NSAIDs for now - amb referral to GI - will discuss with patient if she would like to follow up here for further blood tests prior to her specialist appointment.    Lucie Pinal, DO [facility]

## 2024-06-01 NOTE — Patient Instructions (Signed)
 It was wonderful to see you today!  Your A1c today was 6.7. This means your sugars are well controlled. Please continue to check your blood sugars daily, and take all of your medications, even when you feel well.   We made the following adjustments to your medication regimen:  - we are running a test claim for mounjaro  We checked the following labs today:  -lipid panel -FOBT  If any of your labs are abnormal you will receive a phone call from me to discuss follow up, otherwise your results will be available in MyChart within the next few days.   I also placed a referral for you to see GI to discuss the issues you have been having with your stomach. You should hear from them some time in the next few weeks. If you haven't heard from them by the time we follow up in January, please let me know so I can give you their information.   Please reach out to your pharmacy for any medication refills.   Be Well, Dr. Lucie Pinal, DO

## 2024-06-01 NOTE — Assessment & Plan Note (Addendum)
-   repeat lipid panel today, LDL remained elevated at last check - discussed adjunct therapy with patient, she is amenable if indicated

## 2024-06-02 LAB — LIPID PANEL
Chol/HDL Ratio: 5 ratio — ABNORMAL HIGH (ref 0.0–4.4)
Cholesterol, Total: 262 mg/dL — ABNORMAL HIGH (ref 100–199)
HDL: 52 mg/dL (ref >39)
LDL Chol Calc (NIH): 157 mg/dL — ABNORMAL HIGH (ref 0–99)
Triglycerides: 287 mg/dL — ABNORMAL HIGH (ref 0–149)
VLDL Cholesterol Cal: 53 mg/dL — ABNORMAL HIGH (ref 5–40)

## 2024-06-06 ENCOUNTER — Other Ambulatory Visit: Payer: Self-pay | Admitting: Family Medicine

## 2024-06-06 DIAGNOSIS — I152 Hypertension secondary to endocrine disorders: Secondary | ICD-10-CM

## 2024-06-07 ENCOUNTER — Other Ambulatory Visit: Payer: Self-pay | Admitting: Family Medicine

## 2024-06-07 DIAGNOSIS — J432 Centrilobular emphysema: Secondary | ICD-10-CM

## 2024-06-13 ENCOUNTER — Other Ambulatory Visit: Payer: Self-pay | Admitting: Family Medicine

## 2024-06-13 DIAGNOSIS — E1169 Type 2 diabetes mellitus with other specified complication: Secondary | ICD-10-CM

## 2024-06-15 ENCOUNTER — Encounter: Payer: Self-pay | Admitting: Family Medicine

## 2024-06-15 ENCOUNTER — Ambulatory Visit: Admitting: Family Medicine

## 2024-06-15 ENCOUNTER — Ambulatory Visit
Admission: RE | Admit: 2024-06-15 | Discharge: 2024-06-15 | Disposition: A | Discharge status: Home / Self Care | Source: Ambulatory Visit | Attending: Family Medicine | Admitting: Family Medicine

## 2024-06-15 VITALS — BP 177/98 | HR 84 | Temp 98.4°F | Ht 67.0 in | Wt 243.6 lb

## 2024-06-15 DIAGNOSIS — E1159 Type 2 diabetes mellitus with other circulatory complications: Secondary | ICD-10-CM | POA: Diagnosis not present

## 2024-06-15 DIAGNOSIS — I152 Hypertension secondary to endocrine disorders: Secondary | ICD-10-CM | POA: Diagnosis not present

## 2024-06-15 DIAGNOSIS — J441 Chronic obstructive pulmonary disease with (acute) exacerbation: Secondary | ICD-10-CM | POA: Diagnosis not present

## 2024-06-15 DIAGNOSIS — K922 Gastrointestinal hemorrhage, unspecified: Secondary | ICD-10-CM

## 2024-06-15 MED ORDER — AZITHROMYCIN 250 MG PO TABS: ORAL_TABLET | ORAL | 0 refills | Status: AC

## 2024-06-15 MED ORDER — PREDNISONE 50 MG PO TABS: 50.0000 mg | ORAL_TABLET | Freq: Every day | ORAL | 0 refills | Status: AC

## 2024-06-15 NOTE — Patient Instructions (Addendum)
 It was wonderful to see you today!  I have sent in a steroid and a course of azithromycin , as I think your symptoms may be a COPD exacerbation. I have also put in an order for a Chest Xray to make sure you do not have pneumonia. If you do I will send in an additional antibiotic.   Because you had rectal bleeding at your last visit, we will check a CBC today to make sure you are not anemic. If you are, I will call in an iron supplement for you.   Please call [phone] with any questions about today's appointment.   If you need any additional refills, please call your pharmacy before calling the office.  Lucie Pinal, DO Family Medicine

## 2024-06-15 NOTE — Progress Notes (Addendum)
" ° ° °  SUBJECTIVE:   CHIEF COMPLAINT / HPI:   Has not been well since 12/12. - cough - congestion - wheezing - no fever or shortness of breath  - has been using her nebulizers more often to control her symptoms.   FOBT at last visit was positive, GI referral authorized, visit not yet scheduled. Still having intermittent black tarry stools.  PERTINENT  PMH / PSH: COPD, HTN, Diabetes, CHF  OBJECTIVE:   BP (!) 177/98   Pulse 84   Temp 98.4 F (36.9 C)   Ht 5' 7 (1.702 m)   Wt 243 lb 9.6 oz (110.5 kg)   SpO2 92%   BMI 38.15 kg/m   General: Well appearing, no distress HEENT: MMM, no erythema or exudate of the tonsils Cardiac: RRR, no M/R/G Respiratory: R lung clear to auscultation, L lung diffuse wheezing with coarse crackles heard in mid lung. Upper airway sounds superimposed but improved with cough.  ASSESSMENT/PLAN:   Assessment & Plan COPD with acute exacerbation (HCC) - suspect COPD exacerbation vs. Pneumonia - 5 day course of prednisone , patient is aware to monitor her sugars and adjust accordingly - 3 day course of azithromycin  - CXR to assess for focal consolidation - recent chest CT showed resolution of lung nodule found at prior hospitalization which is reassuring.  Gastrointestinal hemorrhage, unspecified gastrointestinal hemorrhage type - CBC today to assess for anemia - iron therapy as indicated based on results. Hypertension associated with diabetes (HCC) - BP not at goal today, even on recheck.  - suspect secondary to illness - no medication changes at this time, follow up in 2-3 weeks when patient is well and reassess   Lucie Pinal, DO [facility] "

## 2024-06-15 NOTE — Assessment & Plan Note (Signed)
-   BP not at goal today, even on recheck.  - suspect secondary to illness - no medication changes at this time, follow up in 2-3 weeks when patient is well and reassess

## 2024-06-16 ENCOUNTER — Ambulatory Visit: Payer: Self-pay | Admitting: Pulmonary Disease

## 2024-06-16 LAB — CBC
Hematocrit: 45.7 % (ref 34.0–46.6)
Hemoglobin: 14.6 g/dL (ref 11.1–15.9)
MCH: 28.6 pg (ref 26.6–33.0)
MCHC: 31.9 g/dL (ref 31.5–35.7)
MCV: 90 fL (ref 79–97)
Platelets: 375 x10E3/uL (ref 150–450)
RBC: 5.1 x10E6/uL (ref 3.77–5.28)
RDW: 14.8 % (ref 11.7–15.4)
WBC: 7.2 x10E3/uL (ref 3.4–10.8)

## 2024-06-16 NOTE — Progress Notes (Signed)
 Patient's ONO study shows she spent 8hr 42 min with SpO2 less than 88% on room air. Recommend she use 3-4L via nasal canula for trach collar when sleeping at night. Please place DME orders if needed, I believe she may already have home O2 setup though.

## 2024-06-17 ENCOUNTER — Ambulatory Visit: Payer: Self-pay | Admitting: Family Medicine

## 2024-06-17 NOTE — Telephone Encounter (Signed)
 X1 VM/ LM return call

## 2024-06-18 LAB — COVID-19, FLU A+B NAA
Influenza A, NAA: NOT DETECTED
Influenza B, NAA: NOT DETECTED
SARS-CoV-2, NAA: NOT DETECTED

## 2024-06-20 ENCOUNTER — Other Ambulatory Visit: Payer: Self-pay

## 2024-06-20 DIAGNOSIS — Z930 Tracheostomy status: Secondary | ICD-10-CM

## 2024-06-20 DIAGNOSIS — J432 Centrilobular emphysema: Secondary | ICD-10-CM

## 2024-06-21 ENCOUNTER — Telehealth: Payer: Self-pay

## 2024-06-21 NOTE — Telephone Encounter (Signed)
" °  Spoke with PT VBU. Added 3-4 L at night   -NFN  Copied from CRM [id number]. Topic: Clinical - Medical Advice >> Jun 21, 2024  1:53 PM Corean SAUNDERS wrote: Reason for CRM: Patient is requesting a call back as she does not know how much oxygen  Dr. Kara wants her on. "

## 2024-06-21 NOTE — Telephone Encounter (Signed)
" °  Tried to reach out to pt provider has not look at CT scan as soon as  he get a chance someone will reach out to her with results     Copied from CRM #[id number]. Topic: Clinical - Lab/Test Results >> Jun 20, 2024 10:22 AM Isabell A wrote: Reason for CRM: Patient returning phone call for CT results.   Callback number: [phone] "

## 2024-06-21 NOTE — Telephone Encounter (Unsigned)
 Copied from CRM #[id number]. Topic: Clinical - Lab/Test Results >> Jun 20, 2024 10:22 AM Isabell A wrote: Reason for CRM: Patient returning phone call for CT results.   Callback number: [phone]

## 2024-06-22 ENCOUNTER — Telehealth: Payer: Self-pay | Admitting: Pulmonary Disease

## 2024-06-22 NOTE — Telephone Encounter (Signed)
 Could we please have these two orders signed? Thanks!

## 2024-06-22 NOTE — Telephone Encounter (Signed)
 I wrote patient a message with the results on her scan, which stated:  No new lung nodules which is good news. The nodule in the right lower lung is now gone. Overall stable scan   Thanks, JD

## 2024-06-22 NOTE — Telephone Encounter (Signed)
 Would you mind signing the two DME orders for this pt? Thanks!

## 2024-06-23 ENCOUNTER — Telehealth: Payer: Self-pay

## 2024-06-23 NOTE — Telephone Encounter (Signed)
 Copied from CRM #[id number]. Topic: Clinical - Lab/Test Results >> Jun 23, 2024  1:36 PM Dedra B wrote: Reason for CRM: Patient returning call regarding scan results.   I called and spoke to pt. Pt states she has already been informed of her CT scan results. NFN

## 2024-06-23 NOTE — Telephone Encounter (Signed)
 Order has been sent. NFN

## 2024-06-23 NOTE — Telephone Encounter (Addendum)
" °  X2   Tried to reach out to patient VM/ LM - return call       Tried to reach out to patient VM/ LM - return call  "

## 2024-06-24 ENCOUNTER — Telehealth: Payer: Self-pay | Admitting: Pulmonary Disease

## 2024-06-24 NOTE — Telephone Encounter (Signed)
 Per Dolanda at [employer]-dded the ono test on that order but it will need to be a separate order for the ono on cpap  to be done Need order for ONO

## 2024-06-24 NOTE — Telephone Encounter (Signed)
 Disregard previous message. NFN

## 2024-06-29 ENCOUNTER — Other Ambulatory Visit: Payer: Self-pay

## 2024-06-29 DIAGNOSIS — J432 Centrilobular emphysema: Secondary | ICD-10-CM

## 2024-06-29 DIAGNOSIS — Z930 Tracheostomy status: Secondary | ICD-10-CM

## 2024-06-30 ENCOUNTER — Ambulatory Visit: Admitting: Family Medicine

## 2024-06-30 ENCOUNTER — Encounter: Payer: Self-pay | Admitting: Family Medicine

## 2024-06-30 ENCOUNTER — Telehealth: Payer: Self-pay

## 2024-06-30 VITALS — BP 148/83 | Ht 67.0 in | Wt 242.0 lb

## 2024-06-30 DIAGNOSIS — G8929 Other chronic pain: Secondary | ICD-10-CM | POA: Diagnosis not present

## 2024-06-30 DIAGNOSIS — M5416 Radiculopathy, lumbar region: Secondary | ICD-10-CM | POA: Diagnosis not present

## 2024-06-30 DIAGNOSIS — E119 Type 2 diabetes mellitus without complications: Secondary | ICD-10-CM

## 2024-06-30 DIAGNOSIS — I152 Hypertension secondary to endocrine disorders: Secondary | ICD-10-CM | POA: Diagnosis not present

## 2024-06-30 DIAGNOSIS — M545 Low back pain, unspecified: Secondary | ICD-10-CM

## 2024-06-30 DIAGNOSIS — E1159 Type 2 diabetes mellitus with other circulatory complications: Secondary | ICD-10-CM

## 2024-06-30 MED ORDER — METHYLPREDNISOLONE ACETATE 40 MG/ML IJ SUSP
80.0000 mg | Freq: Once | INTRAMUSCULAR | Status: AC
  Administered 2024-06-30: 80 mg via INTRAMUSCULAR

## 2024-06-30 MED ORDER — KETOROLAC TROMETHAMINE 60 MG/2ML IM SOLN
60.0000 mg | Freq: Once | INTRAMUSCULAR | Status: AC
  Administered 2024-06-30: 60 mg via INTRAMUSCULAR

## 2024-06-30 NOTE — Assessment & Plan Note (Signed)
 Elevated BP today with hx of HTN - cont BP meds as prescribed by PCP - follow-up with PCP as scheduled

## 2024-06-30 NOTE — Telephone Encounter (Signed)
 Copied from CRM [id number]. Topic: Clinical - Medical Advice >> Jun 29, 2024  2:34 PM Devaughn RAMAN wrote: Reason for CRM: Pt calling in regards to oxygen . Pt stated she is on oyxgen, pt stated she has to switch out the oxygen . Pt stated she does not think she has a big enough tank to last her throughout the night. Pt oxygen  fills up half way and she says she has to get up in the middle of the night to switch it out. Pt would like a callback regarding this.   I called and spoke to pt. Pt informed that Dr Kara did send in an order yesterday regarding the issue with her o2 tanks and our PCC's sent that over to [employer] this morning. I informed pt to be on the look out for a phone call from [employer] and if she needs anything then to call our office or if she has any type of emergency then to call 911 or go to th er. Pt verbalized understanding. NFN

## 2024-06-30 NOTE — Progress Notes (Addendum)
 PCP: Cleotilde Lukes, DO  Subjective:   HPI: Patient is a 69 y.o. female here for back pain for the last few weeks. She does not remember an inciting event. She does not pick up anything heavy. She does not do much activity during the day.  She says it hurts like a band across her back. One side is not worse than the other but left side is swollen. Feels like since her back has hurt her legs are numb. Unsure if it is due to her diabetes. Also feels like her feet are bricks and has difficulty picking them up.  No bowel or bladder incontinence.  Bone density scan last year did not show osteoporosis.  Last A1c on 06/01/24 was 6.7. B12 was normal.   Past Medical History:  Diagnosis Date   Acute hypoxemic respiratory failure (HCC) 03/05/2024   Acute hypoxic respiratory failure (HCC) 02/22/2024   Anemia    h/o of   Anxiety    Arthritis    in knees   Asthma    Breast cancer (HCC)    Breast cancer (HCC) 07/06/2018   Bronchitis    Carpal tunnel syndrome 03/16/2018   Chronic pain 01/20/2012   Patient with chronic pain at stoma site. Seen by ENT (Dr. Jesus) who recommends pain management. 12/22/11. She is currently on Vicodin and fentanyl .   04/07/13: denies stoma pain. Currently on flexeril , mobic  and tramadol  for b/l shoulder and neck pains .   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   Leukocytopenia    Nausea alone 07/28/2013   Neck pain 01/21/2012   Normal MRI 07/14/2011   negative for mestasis    Plantar fasciitis 06/06/2016   Pneumonia 2012   RUQ abdominal pain 08/08/2022   On exam patient with marked tenderness to percussion and palpation RUQ. Obesity hinder palpation  PLan   Sciatica    Seizures (HCC) 06/29/2011   07/24/11 off Effexor w/o seizure   Sepsis (HCC) 08/04/2012   Sinusitis, chronic 07/20/2011   Bilateral maxillary, identified on MRI of head 07/14/11.     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HCC)     Medications Ordered Prior to Encounter[1]  Past Surgical History:  Procedure Laterality Date   BREAST LUMPECTOMY Right 07/06/2018   BREAST LUMPECTOMY WITH RADIOACTIVE SEED LOCALIZATION Right 07/06/2018   Procedure: RIGHT BREAST LUMPECTOMY WITH RADIOACTIVE SEED LOCALIZATION;  Surgeon: Ebbie Cough, MD;  Location: [facility] OR;  Service: General;  Laterality: Right;   COLONOSCOPY N/A 11/15/2012   Procedure: COLONOSCOPY;  Surgeon: Princella CHRISTELLA Nida, MD;  Location: [facility];  Service: Endoscopy;  Laterality: N/A;   CRYOTHERAPY  05/11/2021   Procedure: CRYOTHERAPY;  Surgeon: Brenna Adine CROME, DO;  Location: [facility];  Service: Pulmonary;;   DENTAL RESTORATION/EXTRACTION WITH X-RAY     ESOPHAGEAL DILATION N/A 09/09/2019   Procedure: ESOPHAGEAL DILATION;  Surgeon: Jesus Oliphant, MD;  Location: [facility];  Service: ENT;  Laterality: N/A;   ESOPHAGEAL DILATION N/A 10/05/2019   Procedure: ESOPHAGEAL DILATION;  Surgeon: Jesus Oliphant, MD;  Location: [facility];  Service: ENT;  Laterality: N/A;  via tracheostomy   ESOPHAGEAL DILATION N/A 02/27/2020   Procedure: ESOPHAGEAL  DILATION;  Surgeon: Jesus Oliphant, MD;  Location: [facility];  Service: ENT;  Laterality: N/A;   ESOPHAGOGASTRODUODENOSCOPY (EGD) WITH PROPOFOL  N/A 08/12/2018   Procedure: ESOPHAGOGASTRODUODENOSCOPY (EGD) WITH PROPOFOL ;  Surgeon: Legrand Victory CROME DOUGLAS, MD;  Location: [facility];  Service: Gastroenterology;  Laterality: N/A;   ESOPHAGOSCOPY  06/21/2012   Procedure: ESOPHAGOSCOPY;  Surgeon: Oliphant Jesus, MD;  Location: [facility];  Service: ENT;  Laterality: N/A;   ESOPHAGOSCOPY N/A 06/24/2021   Procedure: ESOPHAGOSCOPY WITH DILATATION;  Surgeon: Jesus Oliphant, MD;  Location: [facility];  Service: ENT;  Laterality: N/A;   ESOPHAGOSCOPY N/A 02/14/2022   Procedure: ESOPHAGOSCOPY WITH DILATATION;  Surgeon: Jesus Oliphant, MD;  Location: [facility] OR;  Service: ENT;  Laterality: N/A;   ESOPHAGOSCOPY N/A 08/11/2022   Procedure: ESOPHAGOSCOPY WITH DILATATION;  Surgeon: Jesus Oliphant, MD;  Location: [facility] OR;  Service: ENT;  Laterality: N/A;   ESOPHAGOSCOPY WITH DILITATION N/A 09/21/2014   Procedure: ESOPHAGOSCOPY WITH DILITATION;  Surgeon: Oliphant Jesus, MD;  Location: [facility] OR;  Service: ENT;  Laterality: N/A;   ESOPHAGOSCOPY WITH DILITATION N/A 07/04/2016   Procedure: ESOPHAGOSCOPY WITH DILITATION;  Surgeon: Oliphant Jesus, MD;  Location[facility] OR;  Service: ENT;  Laterality: N/A;   ESOPHAGOSCOPY WITH DILITATION N/A 12/01/2017   Procedure: ESOPHAGOSCOPY WITH DILITATION;  Surgeon: Jesus Oliphant, MD;  Location: [facility] OR;  Service: ENT;  Laterality: N/A;   ESOPHAGOSCOPY WITH DILITATION N/A 04/19/2018   Procedure: ESOPHAGOSCOPY WITH DILITATION;  Surgeon: Jesus Oliphant, MD;  Location: [facility] OR;  Service: ENT;  Laterality: N/A;   ESOPHAGOSCOPY WITH DILITATION N/A 03/07/2019   Procedure: Esophagoscopy with dilatation;  Surgeon: Jesus Oliphant, MD;  Location: [facility];  Service: ENT;  Laterality: N/A;   ESOPHAGOSCOPY WITH DILITATION N/A 07/16/2020   Procedure: ESOPHAGOSCOPY WITH  DILATION;  Surgeon: Jesus Oliphant, MD;  Location: [facility];  Service: ENT;  Laterality: N/A;   ESOPHAGOSCOPY WITH DILITATION N/A 10/08/2020   Procedure: ESOPHAGOSCOPY With Dilation;  Surgeon: Jesus Oliphant, MD;  Location: [facility];  Service: ENT;  Laterality: N/A;  21 French to 51 french   ESOPHAGOSCOPY WITH DILITATION N/A 08/05/2023   Procedure: ESOPHAGOSCOPY WITH DILITATION;  Surgeon: Jesus Oliphant, MD;  Location: [facility] OR;  Service: ENT;  Laterality: N/A;   FLEXIBLE BRONCHOSCOPY  01/08/2018       FOREIGN BODY REMOVAL  05/11/2021   Procedure: FOREIGN BODY REMOVAL;  Surgeon: Brenna Adine CROME, DO;  Location: [facility];  Service: Pulmonary;;   FOREIGN BODY REMOVAL BRONCHIAL  10/02/2011   Procedure: REMOVAL FOREIGN BODY BRONCHIAL;  Surgeon: Merilee Kraft, MD;  Location: [facility] OR;  Service: ENT;  Laterality: N/A;   FOREIGN BODY REMOVAL BRONCHIAL N/A 01/08/2018   Procedure: REMOVAL FOREIGN BODY BRONCHIAL;  Surgeon: Arlana Arnt, MD;  Location: [facility];  Service: ENT;  Laterality: N/A;   HEMOSTASIS CONTROL  05/11/2021  Procedure: HEMOSTASIS CONTROL;  Surgeon: Brenna Adine CROME, DO;  Location: [facility];  Service: Pulmonary;;   LARYNGECTOMY     Porta cath removal     PORTACATH PLACEMENT  09/17/2010   Tip in cavoatrial junction   RE-EXCISION OF BREAST CANCER,SUPERIOR MARGINS Right 07/29/2018   Procedure: RE-EXCISION OF RIGHT BREAST MEDIAL MARGINS;  Surgeon: Ebbie Cough, MD;  Location: [facility] OR;  Service: General;  Laterality: Right;   RIGID ESOPHAGOSCOPY N/A 03/19/2019   Procedure: FLEXIBLE ESOPHAGOSCOPY;  Surgeon: Arlana Arnt, MD;  Location: [facility] OR;  Service: ENT;  Laterality: N/A;   STOMAPLASTY N/A 10/21/2016   Procedure: LINNA;  Surgeon: Jesus Oliphant, MD;  Location: [facility] OR;  Service: ENT;  Laterality: N/A;   TOTAL KNEE ARTHROPLASTY Right 12/12/2019   Procedure: RIGHT TOTAL KNEE ARTHROPLASTY;  Surgeon: Jerri Kay HERO, MD;  Location: [facility];  Service:  Orthopedics;  Laterality: Right;   TOTAL KNEE ARTHROPLASTY Left 04/01/2021   Procedure: LEFT TOTAL KNEE ARTHROPLASTY;  Surgeon: Jerri Kay HERO, MD;  Location: [facility];  Service: Orthopedics;  Laterality: Left;   TRACHEAL DILITATION  07/16/2011   Procedure: TRACHEAL DILITATION;  Surgeon: Oliphant VEAR Jesus, MD;  Location: [facility] OR;  Service: ENT;  Laterality: N/A;  dilation of tracheal stoma and replacement of stoma tube   TRACHEAL ESOPHAGEAL PROSTHESIS (TEP) CHANGE N/A 08/11/2022   Procedure: TRACHEAL ESOPHAGEAL PROSTHESIS (TEP) CHANGE;  Surgeon: Jesus Oliphant, MD;  Location: [facility] OR;  Service: ENT;  Laterality: N/A;  ATOS TEP SET   TUBAL LIGATION  1982   VIDEO BRONCHOSCOPY Bilateral 05/11/2021   Procedure: VIDEO BRONCHOSCOPY WITHOUT FLUORO;  Surgeon: Brenna Adine CROME, DO;  Location: [facility];  Service: Pulmonary;  Laterality: Bilateral;  w/ cryotherapy, Foreign body extraction    Allergies[2]  BP (!) 148/83   Ht 5' 7 (1.702 m)   Wt 242 lb (109.8 kg)   BMI 37.90 kg/m       No data to display              No data to display              Objective:  Physical Exam: Gen: NAD, comfortable in exam room MSK: No gross deformity. Standing bent at the hips and  TTP across lower back around L5.  Slight midline tenderness, but has more prominent paraspinal tenderness at the lumbosacral junction Strength LEs 5/5 all muscle groups.   1+ DTRs in patellar and achilles tendons, equal bilaterally. Positive SLR bilaterally . Slightly decreased sensation to light touch on the right lateral lower leg     Assessment & Plan:   Assessment & Plan Chronic bilateral low back pain without sciatica Lumbar radiculopathy Concern for spinal stenosis vs herniated disc vs pinched nerve given length of time with pain, patient age, and physical exam. If further evaluation is negative, patient may have muscular pain with neuropathy secondary to diabetes.  - MRI lumbar spine  - DepoMedrol 80mg  IM and Toradol   60mg  IM injections today in clinic  - heat or ice prn - continue NSAIDs prn - follow-up 1 week after MRI to discuss results and further treatment Hypertension associated with diabetes (HCC) Elevated BP today with hx of HTN - cont BP meds as prescribed by PCP - follow-up with PCP as scheduled Type 2 diabetes mellitus without complication, without long-term current use of insulin  (HCC) A1c well controlled - 6.7 on 05/30/24 May have element of diabetic neuropathy contributing to her symptoms - advised that blood sugars may be  temporarily elevated on CGM due to Depomedrol injection today - cont diabetes medications as prescribed - cont Gabapentin  as prescribed - consider further evaluation for diabetic neuropathy pending MRI results of the lumbar spine      [1]  Current Outpatient Medications on File Prior to Visit  Medication Sig Dispense Refill   acetaminophen  (TYLENOL ) 325 MG tablet Take 2 tablets (650 mg total) by mouth every 6 (six) hours as needed for mild pain (pain score 1-3) or fever (or Fever >/= 101). (Patient not taking: Reported on 05/03/2024)     amLODipine  (NORVASC ) 5 MG tablet Take 1 tablet (5 mg total) by mouth daily. 90 tablet 3   amphetamine -dextroamphetamine  (ADDERALL) 20 MG tablet Take 1 tablet (20 mg total) by mouth 2 (two) times daily. 30 tablet 0   azithromycin  (ZITHROMAX ) 250 MG tablet 1 pill 2 times a day for three days 6 tablet 0   budesonide  (PULMICORT ) 0.5 MG/2ML nebulizer solution Take 2 mLs (0.5 mg total) by nebulization daily. 120 mL 11   buPROPion  (WELLBUTRIN  XL) 150 MG 24 hr tablet Take 1 tablet (150 mg total) by mouth daily. 30 tablet 11   carvedilol  (COREG ) 12.5 MG tablet Take 1 tablet (12.5 mg total) by mouth 2 (two) times daily with a meal. 200 tablet 2   cetirizine  (EQ ALLERGY RELIEF, CETIRIZINE ,) 10 MG tablet Take 1 tablet (10 mg total) by mouth daily. 90 tablet 0   escitalopram  (LEXAPRO ) 20 MG tablet Take 1 tablet (20 mg total) by mouth at bedtime.  90 tablet 3   fluticasone  (FLONASE ) 50 MCG/ACT nasal spray Place 1 spray into both nostrils 2 (two) times daily. 9.9 mL 5   formoterol  (PERFOROMIST ) 20 MCG/2ML nebulizer solution Take 2 mLs (20 mcg total) by nebulization 2 (two) times daily. 120 mL 6   gabapentin  (NEURONTIN ) 100 MG capsule TAKE 1 CAPSULE BY MOUTH 3 TIMES  DAILY 180 capsule 5   hydrOXYzine  (ATARAX ) 25 MG tablet Take 1 tablet (25 mg total) by mouth 3 (three) times daily as needed for anxiety. 90 tablet 3   insulin  glargine (LANTUS  SOLOSTAR) 100 UNIT/ML Solostar Pen Inject 35 Units into the skin daily. 45 mL 2   insulin  lispro (HUMALOG  KWIKPEN) 100 UNIT/ML KwikPen Inject 8 Units into the skin daily with breakfast AND 10 Units 2 (two) times daily before lunch and supper.     ipratropium (ATROVENT ) 0.03 % nasal spray USE 2 SPRAYS IN BOTH NOSTRILS  EVERY 12 HOURS 90 mL 2   levothyroxine  (SYNTHROID ) 75 MCG tablet TAKE 3 TABLETS BY MOUTH DAILY AT 6:00AM 90 tablet 11   losartan  (COZAAR ) 25 MG tablet TAKE 1 TABLET BY MOUTH DAILY 100 tablet 2   metFORMIN  (GLUCOPHAGE -XR) 500 MG 24 hr tablet Take 1 tablet (500 mg total) by mouth daily with breakfast. 30 tablet 11   montelukast  (SINGULAIR ) 10 MG tablet Take 1 tablet (10 mg total) by mouth at bedtime. 90 tablet 0   naproxen  (NAPROSYN ) 500 MG tablet Take 1 tablet (500 mg total) by mouth 2 (two) times daily with a meal. 30 tablet 0   nystatin  (MYCOSTATIN /NYSTOP ) powder Apply 1 Application topically 3 (three) times daily. 15 g 0   ondansetron  (ZOFRAN ) 4 MG tablet Take 1 tablet (4 mg total) by mouth every 8 (eight) hours as needed for nausea or vomiting. 20 tablet 0   OZEMPIC , 1 MG/DOSE, 4 MG/3ML SOPN INJECT SUBCUTANEOUSLY 1 MG EVERY WEEK 9 mL 3   pantoprazole  (PROTONIX ) 40 MG tablet TAKE 1 TABLET BY  MOUTH DAILY 30 tablet 11   predniSONE  (DELTASONE ) 50 MG tablet Take 1 tablet (50 mg total) by mouth daily with breakfast. For 5 days 5 tablet 0   rosuvastatin  (CRESTOR ) 40 MG tablet Take 1 tablet (40 mg  total) by mouth daily. 100 tablet 2   YUPELRI  175 MCG/3ML nebulizer solution USE 1 VIAL VIA NEBULIZER DAILY 360 mL 2   No current facility-administered medications on file prior to visit.  [2]  Allergies Allergen Reactions   Lisinopril  Cough

## 2024-06-30 NOTE — Assessment & Plan Note (Signed)
 A1c well controlled - 6.7 on 05/30/24 May have element of diabetic neuropathy contributing to her symptoms - advised that blood sugars may be temporarily elevated on CGM due to Depomedrol injection today - cont diabetes medications as prescribed - cont Gabapentin  as prescribed - consider further evaluation for diabetic neuropathy pending MRI results of the lumbar spine

## 2024-07-04 ENCOUNTER — Encounter: Payer: Self-pay | Admitting: Podiatry

## 2024-07-04 ENCOUNTER — Ambulatory Visit: Admitting: Podiatry

## 2024-07-04 DIAGNOSIS — M79674 Pain in right toe(s): Secondary | ICD-10-CM | POA: Diagnosis not present

## 2024-07-04 DIAGNOSIS — E1140 Type 2 diabetes mellitus with diabetic neuropathy, unspecified: Secondary | ICD-10-CM

## 2024-07-04 DIAGNOSIS — L853 Xerosis cutis: Secondary | ICD-10-CM | POA: Diagnosis not present

## 2024-07-04 DIAGNOSIS — M79675 Pain in left toe(s): Secondary | ICD-10-CM

## 2024-07-04 DIAGNOSIS — B351 Tinea unguium: Secondary | ICD-10-CM

## 2024-07-04 DIAGNOSIS — E1149 Type 2 diabetes mellitus with other diabetic neurological complication: Secondary | ICD-10-CM | POA: Diagnosis not present

## 2024-07-04 MED ORDER — GABAPENTIN 100 MG PO CAPS: 100.0000 mg | ORAL_CAPSULE | Freq: Three times a day (TID) | ORAL | 0 refills | Status: AC

## 2024-07-04 NOTE — Patient Instructions (Addendum)
 Look for urea 40% cream or ointment and apply to the thickened dry skin / calluses. This can be bought over the counter, at a pharmacy or online such as [employer].

## 2024-07-04 NOTE — Progress Notes (Signed)
"  °  Subjective:  Patient ID: Deborah [name], female    DOB: Dec 24, 1955,  MRN: [id number]  Chief Complaint  Patient presents with   Nail Problem    Pt is here to have her nails trimmed     69 y.o. female presents with the above complaint. History confirmed with patient. Patient presenting with pain related to dystrophic thickened elongated nails. Patient is unable to trim own nails related to nail dystrophy and mobility issues. Patient does have a history of T2DM she states that is under control, she states her last A1c was approximately 6.7.  Complaining of worsening neuropathic pain which affects her sleep at night.  Also complains of dry xerotic pedal skin right posterior leg and diffuse calluses on heels.  Objective:  Physical Exam: warm, good capillary refill, decreased pedal hair growth, pedal skin atrophic nail exam onychomycosis of the toenails, onycholysis, and dystrophic nails greater than 3 mm thickening DP pulses palpable, PT pulses palpable, protective sensation absent, and vibratory sensation diminished, subjective paresthesias Left Foot:  Pain with palpation of nails due to elongation and dystrophic growth.  Right Foot: Pain with palpation of nails due to elongation and dystrophic growth.  Xerotic skin right posterior Achilles level Diffuse callus on heels bilaterally  Assessment:   1. Pain due to onychomycosis of toenails of both feet   2. Diabetic neuropathy with neurologic complication (HCC)   3. Xerosis cutis   4. Type 2 diabetes mellitus with other specified complication, with long-term current use of insulin  [facility])      Plan:  Patient was evaluated and treated and all questions answered.  #Diabetic neuropathy with neuropathic pain -Patient educated on diabetes. Discussed proper diabetic foot care and discussed risks and complications of disease. Educated patient in depth on reasons to return to the office immediately should he/she discover anything concerning or  new on the feet. All questions answered. Discussed proper shoes as well.  - She states that she does not take any medication for neuropathy.  States that pain affects her sleep at night. - Does report low back pain, does have upcoming appointment to have this evaluated as well. - Given her symptoms think is reasonable to start gabapentin  100 mg 3 times daily.  Discussed potential side effects and advised that she decrease dosing regimen if she notices this - Follow-up in 1 month for reevaluation of neuropathy pain -I certify that this diagnosis represents a distinct and separate diagnosis that requires evaluation and treatment separate from other procedures or diagnosis   # Xerosis cutis - Discussed frequent moisturization using Aquaphor or Vaseline to affected areas - May use over-the-counter urea base creams or lotions for dry cracked skin and for calluses on heels 1-2 times a day.   #Onychomycosis with pain  -Nails palliatively debrided as below. -Educated on self-care  Procedure: Nail Debridement Rationale: Pain Type of Debridement: manual, sharp debridement. Instrumentation: Nail nipper, rotary burr. Number of Nails: 10  Return in about 4 weeks (around 08/01/2024) for Neuropathy.         Ethan Saddler, DPM [facility]                   "

## 2024-07-06 ENCOUNTER — Encounter: Payer: Self-pay | Admitting: Family Medicine

## 2024-07-12 ENCOUNTER — Ambulatory Visit: Payer: Self-pay | Admitting: Family Medicine

## 2024-07-12 ENCOUNTER — Other Ambulatory Visit

## 2024-07-14 ENCOUNTER — Encounter: Payer: Self-pay | Admitting: Family Medicine

## 2024-07-15 ENCOUNTER — Ambulatory Visit
Admission: RE | Admit: 2024-07-15 | Discharge: 2024-07-15 | Disposition: A | Source: Ambulatory Visit | Attending: Family Medicine | Admitting: Family Medicine

## 2024-07-15 DIAGNOSIS — M545 Low back pain, unspecified: Secondary | ICD-10-CM

## 2024-07-15 DIAGNOSIS — M5416 Radiculopathy, lumbar region: Secondary | ICD-10-CM

## 2024-07-19 ENCOUNTER — Ambulatory Visit: Payer: Self-pay | Admitting: Family Medicine

## 2024-07-19 NOTE — Progress Notes (Signed)
 MRI results reviewed.  She appears to have disc bulging with severe right foraminal stenosis at L4-5 and has moderate bilateral foraminal stenosis at L5-S1.  And moderate foraminal stenosis at L3-L4 on the left.  Associated underlying facet hypertrophy.  MyChart message sent to schedule follow-up appointment to discuss results and further treatment.

## 2024-07-21 ENCOUNTER — Encounter: Payer: Self-pay | Admitting: Family Medicine

## 2024-07-21 ENCOUNTER — Ambulatory Visit: Payer: Self-pay | Admitting: Family Medicine

## 2024-07-21 ENCOUNTER — Ambulatory Visit: Admitting: Family Medicine

## 2024-07-21 ENCOUNTER — Ambulatory Visit (INDEPENDENT_AMBULATORY_CARE_PROVIDER_SITE_OTHER): Admitting: Family Medicine

## 2024-07-21 VITALS — BP 92/57 | Ht 67.0 in | Wt 246.0 lb

## 2024-07-21 VITALS — BP 85/60 | HR 76 | Ht 67.0 in | Wt 246.6 lb

## 2024-07-21 DIAGNOSIS — M4726 Other spondylosis with radiculopathy, lumbar region: Secondary | ICD-10-CM | POA: Diagnosis not present

## 2024-07-21 DIAGNOSIS — I959 Hypotension, unspecified: Secondary | ICD-10-CM

## 2024-07-21 DIAGNOSIS — E119 Type 2 diabetes mellitus without complications: Secondary | ICD-10-CM

## 2024-07-21 NOTE — Progress Notes (Signed)
 DATE OF VISIT: 07/21/2024        Deborah [name] DOB: 01-Feb-1956 MRN: [id number]  Discussed the use of AI scribe software for clinical note transcription with the patient, who gave verbal consent to proceed.  History of Present Illness Deborah [name] is a 69 year old female with lumbar spondylosis and radiculopathy who presents for follow-up and review of recent lumbar spine MRI results.  Lumbar Radiculopathy and Low Back Pain: - Persistent severe low back pain radiating down the right leg to the right foot - Worsening symptoms over the past two weeks - Difficulty with mobility and intermittent lower extremity weakness - Requires support from objects to prevent falls - Concern about falling - No new numbness or tingling beyond baseline symptoms - No changes in bowel or bladder function, including incontinence  Pain Management and Prior Interventions: - Using NSAIDs (ibuprofen ), acetaminophen , and gabapentin  100mg  three times daily for pain management - no improvement - Gabapentin  causes drowsiness - Received intramuscular Depo Medrol  80 mg and Toradol  60 mg at last visit without symptomatic relief - No participation in physical therapy for back; prior experience with physical therapy for knees  Lumbar Spine Imaging: - Lumbar spine MRI performed on 07/15/24 - Findings as noted below  Type 2 Diabetes Mellitus: - Well controlled with recent A1c of 6.7 on 06/01/24  Medications:  Outpatient Encounter Medications as of 07/21/2024  Medication Sig   acetaminophen  (TYLENOL ) 325 MG tablet Take 2 tablets (650 mg total) by mouth every 6 (six) hours as needed for mild pain (pain score 1-3) or fever (or Fever >/= 101). (Patient not taking: Reported on 05/03/2024)   amLODipine  (NORVASC ) 5 MG tablet Take 1 tablet (5 mg total) by mouth daily.   amphetamine -dextroamphetamine  (ADDERALL) 20 MG tablet Take 1 tablet (20 mg total) by mouth 2 (two) times daily.   azithromycin  (ZITHROMAX ) 250 MG tablet  1 pill 2 times a day for three days   budesonide  (PULMICORT ) 0.5 MG/2ML nebulizer solution Take 2 mLs (0.5 mg total) by nebulization daily.   buPROPion  (WELLBUTRIN  XL) 150 MG 24 hr tablet Take 1 tablet (150 mg total) by mouth daily.   carvedilol  (COREG ) 12.5 MG tablet Take 1 tablet (12.5 mg total) by mouth 2 (two) times daily with a meal.   cetirizine  (EQ ALLERGY RELIEF, CETIRIZINE ,) 10 MG tablet Take 1 tablet (10 mg total) by mouth daily.   escitalopram  (LEXAPRO ) 20 MG tablet Take 1 tablet (20 mg total) by mouth at bedtime.   fluticasone  (FLONASE ) 50 MCG/ACT nasal spray Place 1 spray into both nostrils 2 (two) times daily.   formoterol  (PERFOROMIST ) 20 MCG/2ML nebulizer solution Take 2 mLs (20 mcg total) by nebulization 2 (two) times daily.   gabapentin  (NEURONTIN ) 100 MG capsule Take 1 capsule (100 mg total) by mouth 3 (three) times daily.   hydrOXYzine  (ATARAX ) 25 MG tablet Take 1 tablet (25 mg total) by mouth 3 (three) times daily as needed for anxiety.   insulin  glargine (LANTUS  SOLOSTAR) 100 UNIT/ML Solostar Pen Inject 35 Units into the skin daily.   insulin  lispro (HUMALOG  KWIKPEN) 100 UNIT/ML KwikPen Inject 8 Units into the skin daily with breakfast AND 10 Units 2 (two) times daily before lunch and supper.   ipratropium (ATROVENT ) 0.03 % nasal spray USE 2 SPRAYS IN BOTH NOSTRILS  EVERY 12 HOURS   levothyroxine  (SYNTHROID ) 75 MCG tablet TAKE 3 TABLETS BY MOUTH DAILY AT 6:00AM   losartan  (COZAAR ) 25 MG tablet TAKE 1 TABLET BY MOUTH DAILY  metFORMIN  (GLUCOPHAGE -XR) 500 MG 24 hr tablet Take 1 tablet (500 mg total) by mouth daily with breakfast.   montelukast  (SINGULAIR ) 10 MG tablet Take 1 tablet (10 mg total) by mouth at bedtime.   naproxen  (NAPROSYN ) 500 MG tablet Take 1 tablet (500 mg total) by mouth 2 (two) times daily with a meal.   nystatin  (MYCOSTATIN /NYSTOP ) powder Apply 1 Application topically 3 (three) times daily.   ondansetron  (ZOFRAN ) 4 MG tablet Take 1 tablet (4 mg total) by  mouth every 8 (eight) hours as needed for nausea or vomiting.   OZEMPIC , 1 MG/DOSE, 4 MG/3ML SOPN INJECT SUBCUTANEOUSLY 1 MG EVERY WEEK   pantoprazole  (PROTONIX ) 40 MG tablet TAKE 1 TABLET BY MOUTH DAILY   predniSONE  (DELTASONE ) 50 MG tablet Take 1 tablet (50 mg total) by mouth daily with breakfast. For 5 days   rosuvastatin  (CRESTOR ) 40 MG tablet Take 1 tablet (40 mg total) by mouth daily.   YUPELRI  175 MCG/3ML nebulizer solution USE 1 VIAL VIA NEBULIZER DAILY   No facility-administered encounter medications on file as of 07/21/2024.    Allergies: is allergic to lisinopril .  Physical Examination: Vitals: BP (!) 92/57   Ht 5' 7 (1.702 m)   Wt 246 lb (111.6 kg)   BMI 38.53 kg/m  GENERAL:  Deborah [name] is a 69 y.o. female appearing their stated age, alert and oriented x 3, in no apparent distress.  Has tracheostomy in place MSK: Lumbar spine with decreased range of motion with pain.  No midline tenderness.  Bilateral paraspinal tenderness.  Able to toe walk and heel walk, but with difficulty.  Positive straight leg raise on the right, negative on the left.  Lower extremity strength 5-/5 bilaterally. NEURO: sensation intact to light touch, DTR 1/4 Achilles and patella bilaterally and symmetric VASC: Trace bilateral lower extremity edema  Radiology: MRI LSPINE W/O CONTRAST 07/15/24 showing: IMPRESSION: 1. L4-L5 intermediate-sized disc bulge with moderate facet hypertrophy and severe right foraminal stenosis, no spinal canal stenosis. 2. L5-S1 small disc bulge with endplate spurring and moderate bilateral foraminal stenosis, no spinal canal stenosis. 3. L3-4 small left asymmetric disc bulge with moderate left foraminal stenosis, no spinal canal stenosis.  Assessment & Plan Lumbar spondylosis with radiculopathy Chronic lumbar spondylosis with radiculopathy with severe lumbar pain radiating down the right leg. MRI showed degenerative arthritis and disc bulging causing right-sided nerve  root impingement. No herniated disc or surgical emergency. Non-surgical management preferred. - Referred to pain management (Dr. Eldonna at [facility]) for evaluation and consideration of epidural steroid injection. - Discussed MRI findings and rationale for non-surgical management. - Recommended initiation of physical therapy to reduce nerve irritation and improve function.  Referral to [facility] PT was given today. Advised preference for physical therapy over chiropractic care due to risk of symptom exacerbation. - Adjusted gabapentin : increase to 300 mg at bedtime (if tolerated) and 100 mg in the morning and midday, monitor for sedation. - Advised follow-up in 6-8 weeks to reassess symptoms and response to interventions.  Diabetes mellitus type 2 without long-term insulin  usage - Well-controlled, last A1c 6.7 on 05/30/2024 - Should continue her diabetic regimen - Given underlying diabetes will refer to Dr. Eldonna to discuss possible epidural injections and risk/benefits     Patient expressed understanding & agreement with above.  Encounter Diagnoses  Name Primary?   Osteoarthritis of spine with radiculopathy, lumbar region Yes   Type 2 diabetes mellitus without complication, without long-term current use of insulin  (HCC)     Orders Placed  This Encounter  Procedures   Ambulatory referral to Physical Therapy   Ambulatory referral to Physical Medicine Rehab     Contains text generated by Abridge.

## 2024-07-21 NOTE — Patient Instructions (Addendum)
 It was wonderful to see you today!  Your blood pressure today was too low. Please hold your Coreg  tomorrow, and check your blood pressure. If it is below 140/90, continue to hold your Coreg  until you see me again next week. If your dizziness gets worse, or you feel unsteady on your feet, please go to the emergency department right away.   You should also make sure you are staying well hydrated. Drink at least 64 oz of fluids daily, and you may drink pedialyte or gatorade to replenish your electrolytes.   I will see you in one week to reassess.   Please call [phone] with any questions about today's appointment.   If you need any additional refills, please call your pharmacy before calling the office.  Lucie Pinal, DO Family Medicine

## 2024-07-21 NOTE — Progress Notes (Signed)
" ° ° °  SUBJECTIVE:   CHIEF COMPLAINT / HPI:   BP follow up due to elevated readings at prior visits. Chart review shows mild improvement at visits with other providers in the interim. Current BP medications include: - amlodipine  5 mg - coreg  12.5 mg - losartan  25 mg  Today BP is low, and she is having some dizziness with standing. Able to ambulate appropriately without assistance. No headache, shortness of breath. Has been strictly adhering to low salt diet with much less fried food.   PERTINENT  PMH / PSH: HTN, DM, COPD, tracheostomy   OBJECTIVE:   BP (!) 85/60   Pulse 76   Ht 5' 7 (1.702 m)   Wt 246 lb 9.6 oz (111.9 kg)   SpO2 92%   BMI 38.62 kg/m   General: A&O, NAD Cardiac: RRR, no m/r/g Respiratory: CTAB, normal WOB, no w/c/r Extremities: NTTP, no peripheral edema.   ASSESSMENT/PLAN:   Assessment & Plan Hypotension, unspecified hypotension type - BP did not improve, but patient is overall feeling well and safe to self - patient will hold coreg  and amlodipine  tomorrow, and monitor BP with home cuff - patient is aware of and in agreement with strict ED precautions. - follow up scheduled for one week   Lucie Pinal, DO [facility] "

## 2024-07-21 NOTE — Patient Instructions (Addendum)
 VISIT SUMMARY: During your visit, we discussed your ongoing severe low back pain and reviewed your recent lumbar spine MRI results. We also addressed your occasional nocturnal confusion and your current pain management regimen.  YOUR PLAN: -LUMBAR SPONDYLOSIS WITH RADICULOPATHY: This condition involves degeneration of the spine and irritation of the nerves, causing severe pain that radiates down your leg. Your MRI showed arthritis and disc bulging, but no herniated discs.  We will manage this non-surgically.  You are referred to pain management for evaluation and possible epidural steroid injection. We discussed the MRI findings and the reasons for choosing non-surgical management. You should start physical therapy to help reduce nerve irritation and improve your function. We prefer physical therapy over chiropractic care to avoid worsening your symptoms.  Your gabapentin  dosage has been adjusted to three tablets at bedtime (if tolerated) and one tablet twice daily. Please monitor for any sedation effects.  -GENERAL HEALTH MAINTENANCE: Your recent A1c test shows that your Type 2 Diabetes is well controlled. Continue with your current management plan and maintain a healthy lifestyle.  INSTRUCTIONS: - We placed a referral to physical therapy.  You can call them to schedule. - I am also referring you to Dr. Eldonna at [facility] to discuss possible lumbar spine epidural injections.  You can call them to schedule an appointment. - You can increase your gabapentin  dosage at bedtime to 300 mg, but continue to take your 100 mg in the morning and midday as you have been doing.  If this makes you too sleepy you can go back to 100 mg 3 times a day - Please follow up in 6-8 weeks to reassess your symptoms and response to the interventions. Monitor for any sedation effects from the adjusted gabapentin  dosage.   Contains text generated by Abridge.

## 2024-07-29 ENCOUNTER — Ambulatory Visit: Payer: Self-pay | Admitting: Family Medicine

## 2024-08-01 ENCOUNTER — Ambulatory Visit

## 2024-08-01 ENCOUNTER — Ambulatory Visit: Admitting: Physical Medicine and Rehabilitation

## 2024-08-11 ENCOUNTER — Ambulatory Visit: Admitting: Podiatry

## 2024-09-13 ENCOUNTER — Ambulatory Visit: Admitting: Pulmonary Disease

## 2024-09-15 ENCOUNTER — Ambulatory Visit: Admitting: Podiatry
